# Patient Record
Sex: Female | Born: 1955
Health system: Southern US, Community
[De-identification: ages and names within clinical notes are randomized; demographics above are authoritative.]

## PROBLEM LIST (undated history)

## (undated) DIAGNOSIS — F419 Anxiety disorder, unspecified: Secondary | ICD-10-CM

## (undated) DIAGNOSIS — Z9889 Other specified postprocedural states: Secondary | ICD-10-CM

## (undated) DIAGNOSIS — R040 Epistaxis: Secondary | ICD-10-CM

## (undated) DIAGNOSIS — R0600 Dyspnea, unspecified: Secondary | ICD-10-CM

## (undated) DIAGNOSIS — I272 Pulmonary hypertension, unspecified: Secondary | ICD-10-CM

## (undated) DIAGNOSIS — I5032 Chronic diastolic (congestive) heart failure: Secondary | ICD-10-CM

## (undated) DIAGNOSIS — Q600 Renal agenesis, unilateral: Secondary | ICD-10-CM

## (undated) DIAGNOSIS — J439 Emphysema, unspecified: Secondary | ICD-10-CM

## (undated) DIAGNOSIS — G4730 Sleep apnea, unspecified: Secondary | ICD-10-CM

## (undated) DIAGNOSIS — K219 Gastro-esophageal reflux disease without esophagitis: Secondary | ICD-10-CM

## (undated) DIAGNOSIS — I480 Paroxysmal atrial fibrillation: Secondary | ICD-10-CM

## (undated) DIAGNOSIS — Z8669 Personal history of other diseases of the nervous system and sense organs: Secondary | ICD-10-CM

## (undated) DIAGNOSIS — F329 Major depressive disorder, single episode, unspecified: Secondary | ICD-10-CM

## (undated) DIAGNOSIS — Z8774 Personal history of (corrected) congenital malformations of heart and circulatory system: Secondary | ICD-10-CM

## (undated) DIAGNOSIS — J189 Pneumonia, unspecified organism: Secondary | ICD-10-CM

## (undated) DIAGNOSIS — J449 Chronic obstructive pulmonary disease, unspecified: Secondary | ICD-10-CM

## (undated) DIAGNOSIS — J4489 Other specified chronic obstructive pulmonary disease: Secondary | ICD-10-CM

## (undated) DIAGNOSIS — T753XXA Motion sickness, initial encounter: Secondary | ICD-10-CM

## (undated) DIAGNOSIS — E039 Hypothyroidism, unspecified: Secondary | ICD-10-CM

## (undated) DIAGNOSIS — I340 Nonrheumatic mitral (valve) insufficiency: Secondary | ICD-10-CM

## (undated) DIAGNOSIS — R112 Nausea with vomiting, unspecified: Secondary | ICD-10-CM

## (undated) DIAGNOSIS — F32A Depression, unspecified: Secondary | ICD-10-CM

## (undated) DIAGNOSIS — I10 Essential (primary) hypertension: Secondary | ICD-10-CM

## (undated) HISTORY — DX: Other specified chronic obstructive pulmonary disease: J44.89

## (undated) HISTORY — DX: Chronic diastolic (congestive) heart failure: I50.32

## (undated) HISTORY — DX: Essential (primary) hypertension: I10

## (undated) HISTORY — DX: Personal history of other diseases of the nervous system and sense organs: Z86.69

## (undated) HISTORY — DX: Other specified postprocedural states: Z98.890

## (undated) HISTORY — DX: Major depressive disorder, single episode, unspecified: F32.9

## (undated) HISTORY — PX: TONSILLECTOMY: SUR1361

## (undated) HISTORY — DX: Renal agenesis, unilateral: Q60.0

## (undated) HISTORY — DX: Depression, unspecified: F32.A

## (undated) HISTORY — PX: INNER EAR SURGERY: SHX679

## (undated) HISTORY — DX: Nonrheumatic mitral (valve) insufficiency: I34.0

## (undated) HISTORY — DX: Paroxysmal atrial fibrillation: I48.0

## (undated) HISTORY — PX: ABDOMINAL HYSTERECTOMY: SHX81

---

## 1994-03-31 HISTORY — PX: COMBINED HYSTERECTOMY ABDOMINAL W/ A&P REPAIR / OOPHORECTOMY: SUR292

## 2008-09-19 ENCOUNTER — Ambulatory Visit: Payer: Self-pay | Admitting: Internal Medicine

## 2008-09-27 ENCOUNTER — Ambulatory Visit: Payer: Self-pay | Admitting: Internal Medicine

## 2008-11-07 ENCOUNTER — Ambulatory Visit: Payer: Self-pay | Admitting: Internal Medicine

## 2009-03-31 HISTORY — PX: BREAST BIOPSY: SHX20

## 2009-05-01 ENCOUNTER — Other Ambulatory Visit: Payer: Self-pay | Admitting: Internal Medicine

## 2010-04-30 ENCOUNTER — Other Ambulatory Visit: Payer: Self-pay | Admitting: Internal Medicine

## 2010-12-10 ENCOUNTER — Ambulatory Visit: Payer: Self-pay | Admitting: Internal Medicine

## 2010-12-17 ENCOUNTER — Telehealth: Payer: Self-pay | Admitting: Internal Medicine

## 2010-12-17 NOTE — Telephone Encounter (Signed)
Her chart is not abstracted and I cannot recall her disability.  Please ask her to fax or write me a letter stating why she cannot sit for jury duty and I will be happy to review it.  Has she requested her records to be transferred yet?

## 2010-12-17 NOTE — Telephone Encounter (Signed)
Patient is asking for a letter to excuse her from [place] duty.

## 2010-12-17 NOTE — Telephone Encounter (Signed)
PT HAS BEEN CALLED FOR JURY DUTY 01/06/11.  SHE WOULD LIKE A LETTER STATING THAT SHE IS DISABLE AND CANNOT DO JURY DUTY

## 2010-12-19 ENCOUNTER — Encounter: Payer: Self-pay | Admitting: Internal Medicine

## 2010-12-19 NOTE — Telephone Encounter (Signed)
Patient notified. She says that she will bring a letter in and drop it off stating what her disability is. She will also sign medical release form for her records.

## 2011-01-20 ENCOUNTER — Telehealth: Payer: Self-pay | Admitting: Internal Medicine

## 2011-01-20 NOTE — Telephone Encounter (Signed)
Patient stated she currently takes the brand but would like to be out on the generic.  She takes 137 mcg.  Is this ok to call in?

## 2011-01-20 NOTE — Telephone Encounter (Signed)
Patient is needing samples of her synthroid 130 mg she can't afford to pay for her medication. She is waiting on her medicaid to start.

## 2011-01-20 NOTE — Telephone Encounter (Signed)
We do not have samples of Synthroid, to my knowledge.  Is she taking name brand or levothyroxine

## 2011-01-20 NOTE — Telephone Encounter (Signed)
Left message asking patient to return my call.

## 2011-01-20 NOTE — Telephone Encounter (Signed)
Yes can call in levothyroxne 137 mcg daily @30  with 4 refills

## 2011-01-21 NOTE — Telephone Encounter (Signed)
Left message for patient to return my call, need to know the pharmacy.

## 2011-01-24 MED ORDER — LEVOTHYROXINE SODIUM 137 MCG PO TABS: 137.0000 ug | ORAL_TABLET | Freq: Every day | ORAL | Status: DC

## 2011-01-24 NOTE — Telephone Encounter (Signed)
Patient called me back and Rx has been called in.

## 2011-01-24 NOTE — Telephone Encounter (Signed)
Addended by: Darletta Moll A on: 01/24/2011 09:11 AM   Modules accepted: Orders

## 2011-01-26 ENCOUNTER — Emergency Department: Payer: Self-pay | Admitting: Emergency Medicine

## 2011-02-03 ENCOUNTER — Ambulatory Visit (INDEPENDENT_AMBULATORY_CARE_PROVIDER_SITE_OTHER): Payer: Self-pay | Admitting: Internal Medicine

## 2011-02-03 ENCOUNTER — Encounter: Payer: Self-pay | Admitting: Internal Medicine

## 2011-02-03 VITALS — BP 142/80 | HR 83 | Temp 98.5°F | Resp 18 | Ht 62.5 in | Wt 262.2 lb

## 2011-02-03 DIAGNOSIS — Z13220 Encounter for screening for lipoid disorders: Secondary | ICD-10-CM

## 2011-02-03 DIAGNOSIS — E669 Obesity, unspecified: Secondary | ICD-10-CM

## 2011-02-03 DIAGNOSIS — E039 Hypothyroidism, unspecified: Secondary | ICD-10-CM

## 2011-02-03 DIAGNOSIS — Z79899 Other long term (current) drug therapy: Secondary | ICD-10-CM

## 2011-02-03 DIAGNOSIS — J209 Acute bronchitis, unspecified: Secondary | ICD-10-CM

## 2011-02-03 MED ORDER — BENZONATATE 200 MG PO CAPS: 200.0000 mg | ORAL_CAPSULE | Freq: Every evening | ORAL | Status: AC | PRN

## 2011-02-03 NOTE — Patient Instructions (Addendum)
You will need to call the old office at [phone] to get the financial information you need about last year's visits.  Please try the cough medicine Delsym available over the counter for your cough for during the day.  Use the prescription cough medicine for nighttime cough.,  It does not have codeine in it    Gargle with salt water for your scratchy   Avoid milk, ce cream and yogurt for a few more days becausde they can aggravate your cough

## 2011-02-03 NOTE — Progress Notes (Signed)
  Subjective:    Patient ID: Lauren Bartlett, female    DOB: 1955/07/14, 55 y.o.   MRN: [id number]  HPI   Lauren Bartlett is a 55 yo white female with history of hypercholesterolemia, depression, back pain and hypothyroidism  Who presents with symptoms of persistent bronchitis.  She was treated by ER  Last Sunday for cough productive of purulent sputum with a 5 day course of azithromycin.  She contineus to have cough and hoarseness  with scratchy throat and wheezing but notes that her cough is when she is supine.  She lives in an adult boarding house and has sick contacts but is apparently going to move to subsidized housing soon,  Review of Systems  Constitutional: Negative for fever, chills and unexpected weight change.  HENT: Negative for hearing loss, ear pain, nosebleeds, congestion, sore throat, facial swelling, rhinorrhea, sneezing, mouth sores, trouble swallowing, neck pain, neck stiffness, voice change, postnasal drip, sinus pressure, tinnitus and ear discharge.   Eyes: Negative for pain, discharge, redness and visual disturbance.  Respiratory: Positive for cough. Negative for chest tightness, shortness of breath, wheezing and stridor.   Cardiovascular: Negative for chest pain, palpitations and leg swelling.  Musculoskeletal: Positive for back pain. Negative for myalgias and arthralgias.  Skin: Negative for color change and rash.  Neurological: Negative for dizziness, weakness, light-headedness and headaches.  Hematological: Negative for adenopathy.       Objective:   Physical Exam  Constitutional: She is oriented to person, place, and time. She appears well-developed and well-nourished.  HENT:  Mouth/Throat: Oropharynx is clear and moist.  Eyes: EOM are normal. Pupils are equal, round, and reactive to light. No scleral icterus.  Neck: Normal range of motion. Neck supple. No JVD present. No thyromegaly present.  Cardiovascular: Normal rate, regular rhythm, normal heart sounds and intact distal  pulses.   Pulmonary/Chest: Effort normal and breath sounds normal. She has no wheezes.  Abdominal: Soft. Bowel sounds are normal. She exhibits no mass. There is no tenderness.  Musculoskeletal: Normal range of motion. She exhibits no edema.  Lymphadenopathy:    She has no cervical adenopathy.  Neurological: She is alert and oriented to person, place, and time.  Skin: Skin is warm and dry.  Psychiatric: Her behavior is normal. Judgment and thought content normal.       Mood and affect are chronically flat          Assessment & Plan:  Upper respiratory infection: she has no signs on exam of otitis or sinusitis and is not wheezing.  Continue  supportive care  Hypertension: her bp is improved from last visit but not at goal.  She has been ill and has taken decongestants recently.  No changes today.  Chronic back pain:  Per records just received form old practice: She was referred for MRI of lumbar spine in Feb 2011 but those records ar e  not available

## 2011-02-04 ENCOUNTER — Encounter: Payer: Self-pay | Admitting: Internal Medicine

## 2011-02-04 ENCOUNTER — Telehealth: Payer: Self-pay | Admitting: Internal Medicine

## 2011-02-04 DIAGNOSIS — I10 Essential (primary) hypertension: Secondary | ICD-10-CM | POA: Insufficient documentation

## 2011-02-04 DIAGNOSIS — Q600 Renal agenesis, unilateral: Secondary | ICD-10-CM | POA: Insufficient documentation

## 2011-02-04 DIAGNOSIS — F329 Major depressive disorder, single episode, unspecified: Secondary | ICD-10-CM | POA: Insufficient documentation

## 2011-02-04 DIAGNOSIS — F32A Depression, unspecified: Secondary | ICD-10-CM | POA: Insufficient documentation

## 2011-02-04 DIAGNOSIS — Z8669 Personal history of other diseases of the nervous system and sense organs: Secondary | ICD-10-CM | POA: Insufficient documentation

## 2011-02-04 NOTE — Telephone Encounter (Signed)
[phone] Pt needs a letter from you stating that she is disabled and can not work, so pt can get a low income,handicap apartment.  She needs this tomorrow and would like to pick this up when she comes in to get labs. Pt stated that she has a letter from social security that gives the reason why she is disabled she can bring this by tomorrow if you need to see it.

## 2011-02-04 NOTE — Telephone Encounter (Signed)
Left message for pt to call office

## 2011-02-04 NOTE — Telephone Encounter (Signed)
.   I do not sign disability papers for patients, unless they have already been awarded disability status by the [facility].  I do not hav e her old records so I do not know  if she is currently disabled and for what diagosis,  or is she requesting disability? Adn she has to give me at least a weeks notice for this kind of request

## 2011-02-04 NOTE — Telephone Encounter (Signed)
Pt will bring her disability paper from the state tomorrow 02/05/11

## 2011-02-05 ENCOUNTER — Other Ambulatory Visit: Payer: Self-pay

## 2011-02-06 ENCOUNTER — Other Ambulatory Visit: Payer: Self-pay | Admitting: Internal Medicine

## 2011-02-06 ENCOUNTER — Other Ambulatory Visit (INDEPENDENT_AMBULATORY_CARE_PROVIDER_SITE_OTHER): Payer: Self-pay | Admitting: *Deleted

## 2011-02-06 DIAGNOSIS — E039 Hypothyroidism, unspecified: Secondary | ICD-10-CM

## 2011-02-06 DIAGNOSIS — Z13220 Encounter for screening for lipoid disorders: Secondary | ICD-10-CM

## 2011-02-06 DIAGNOSIS — Z79899 Other long term (current) drug therapy: Secondary | ICD-10-CM

## 2011-02-06 LAB — COMPREHENSIVE METABOLIC PANEL
ALT: 18 U/L (ref 0–35)
Albumin: 4 g/dL (ref 3.5–5.2)
CO2: 31 mEq/L (ref 19–32)
Calcium: 8.2 mg/dL — ABNORMAL LOW (ref 8.4–10.5)
Chloride: 104 mEq/L (ref 96–112)
GFR: 79.1 mL/min (ref >60.00)
Glucose, Bld: 93 mg/dL (ref 70–99)
Potassium: 4 mEq/L (ref 3.5–5.1)
Sodium: 143 mEq/L (ref 135–145)
Total Bilirubin: 0.5 mg/dL (ref 0.3–1.2)
Total Protein: 7.7 g/dL (ref 6.0–8.3)

## 2011-02-06 LAB — LIPID PANEL: Total CHOL/HDL Ratio: 4

## 2011-02-06 LAB — TSH: TSH: 0.45 u[IU]/mL (ref 0.35–5.50)

## 2011-05-06 ENCOUNTER — Encounter: Payer: Self-pay | Admitting: Internal Medicine

## 2011-05-06 ENCOUNTER — Ambulatory Visit: Payer: Self-pay | Admitting: Internal Medicine

## 2011-05-09 ENCOUNTER — Other Ambulatory Visit: Payer: Self-pay | Admitting: Internal Medicine

## 2011-05-23 ENCOUNTER — Telehealth: Payer: Self-pay | Admitting: *Deleted

## 2011-05-23 ENCOUNTER — Emergency Department: Payer: Self-pay | Admitting: Emergency Medicine

## 2011-05-23 NOTE — Telephone Encounter (Signed)
Patient wanted you to know that she called EMS this morning because she is having flu like symptoms. She is having cold sweats, fever, achey, a lot congestion, some wheezing, she says that she has a terrible headache that is making her feel nauseated and feeling like she is going to throw up. They were going to take her to the hospital, but she refused to go because she wouldn't have a way to get home, so she is gong to drive to the ER. She wanted you to be aware

## 2011-06-09 ENCOUNTER — Other Ambulatory Visit: Payer: Self-pay | Admitting: Internal Medicine

## 2011-06-09 MED ORDER — LEVOTHYROXINE SODIUM 137 MCG PO TABS: 137.0000 ug | ORAL_TABLET | Freq: Every day | ORAL | Status: DC

## 2011-06-09 NOTE — Telephone Encounter (Signed)
Addended by: Darletta Moll A on: 06/09/2011 04:18 PM   Modules accepted: Orders

## 2011-06-13 ENCOUNTER — Ambulatory Visit: Payer: Self-pay | Admitting: Internal Medicine

## 2011-08-04 ENCOUNTER — Encounter: Payer: Self-pay | Admitting: Internal Medicine

## 2011-08-04 ENCOUNTER — Ambulatory Visit (INDEPENDENT_AMBULATORY_CARE_PROVIDER_SITE_OTHER): Payer: Medicare Other | Admitting: Internal Medicine

## 2011-08-04 VITALS — BP 142/76 | HR 74 | Temp 98.2°F | Resp 16 | Wt 243.8 lb

## 2011-08-04 DIAGNOSIS — E669 Obesity, unspecified: Secondary | ICD-10-CM | POA: Diagnosis not present

## 2011-08-04 DIAGNOSIS — I10 Essential (primary) hypertension: Secondary | ICD-10-CM

## 2011-08-04 DIAGNOSIS — Z79899 Other long term (current) drug therapy: Secondary | ICD-10-CM | POA: Diagnosis not present

## 2011-08-04 DIAGNOSIS — F32A Depression, unspecified: Secondary | ICD-10-CM

## 2011-08-04 DIAGNOSIS — E039 Hypothyroidism, unspecified: Secondary | ICD-10-CM

## 2011-08-04 DIAGNOSIS — F329 Major depressive disorder, single episode, unspecified: Secondary | ICD-10-CM

## 2011-08-04 DIAGNOSIS — Z1239 Encounter for other screening for malignant neoplasm of breast: Secondary | ICD-10-CM

## 2011-08-04 LAB — COMPLETE METABOLIC PANEL WITH GFR
ALT: 13 U/L (ref 0–35)
BUN: 9 mg/dL (ref 6–23)
CO2: 29 mEq/L (ref 19–32)
Calcium: 8.2 mg/dL — ABNORMAL LOW (ref 8.4–10.5)
Chloride: 102 mEq/L (ref 96–112)
Creat: 0.7 mg/dL (ref 0.50–1.10)
GFR, Est African American: >89 mL/min
GFR, Est Non African American: >89 mL/min
Glucose, Bld: 75 mg/dL (ref 70–99)
Total Bilirubin: 0.5 mg/dL (ref 0.3–1.2)

## 2011-08-04 LAB — TSH: TSH: 0.51 u[IU]/mL (ref 0.35–5.50)

## 2011-08-04 MED ORDER — LOSARTAN POTASSIUM 100 MG PO TABS: 100.0000 mg | ORAL_TABLET | Freq: Every day | ORAL | Status: DC

## 2011-08-04 NOTE — Assessment & Plan Note (Signed)
With a 20 lb wt loss in 5 months.  Encouragement given.  Diet reviewed.  Return in 6 months

## 2011-08-04 NOTE — Patient Instructions (Addendum)
You may improve your diet by eating fewer servings of starches  By avoiding bananas.  Strawberries  Blueberries,  Cherries are all better choices bc they are lower in sugar.   Consider the Low Glycemic Index Diet and 6 smaller meals daily to continue your weight loss :   7 AM Low carbohydrate Protein  Shakes (EAS Carb Control  Or Atkins ,  Available everywhere,   In  cases at [employer]Js )  2.5 carbs  (Add or substitute a toasted sandwhich thin w/ peanut butter)  Or try 2 hard boiled egg ,    10 AM: Protein bar by Atkins (snack size,  Chocolate lover's variety at  [employer]J's)    Lunch: sandwich on pita bread or flatbread (Joseph's makes a pita bread and a flat bread , available at [employer] and [employer]; Toufayah makes a low carb flatbread available at [employer] and [employer]) Mission makes a "car balance " tortilla tha tis full of fiber and low in carbohydrates  3 PM:  Mid day :  Another protein bar,  Or a  cheese stick, 1/4 cup of almonds, walnuts, pistachios, pecans, peanuts,  Macadamia nuts  6 PM  Dinner:  "mean and green:"  Meat/chicken/fish, salad, and green veggie : use ranch, vinagrette,  Blue cheese, etc  9 PM snack : Breyer's low carb fudgiscle or  ice cream bar (Carb Smart) Weight Watcher's ice cream bar , or another protein shake

## 2011-08-04 NOTE — Progress Notes (Signed)
Patient ID: Lauren Bartlett, female   DOB: 13-Jun-1955, 56 y.o.   MRN: [id number] Patient Active Problem List  Diagnoses  . Depression  . Hypertension  . Congenital absence of one kidney  . History of sciatica  . Obesity (BMI 30-39.9)    Subjective:  CC:   Chief Complaint  Patient presents with  . Follow-up    HPI: Has lost 20 lbs by walking 2 miles  4 days per week about and restricting her diet. She has noticed an  Improvement in dsypnea since she started walking 6 months ago.  The exercise program started when she moved in to her new residence 6 months ago. Her dietary changes incldue avoidance of junk food including cakes and desserts and discontinuing sodas.     Lauren Bartlett a 56 y.o. female who presents   Past Medical History  Diagnosis Date  . Congenital absence of one kidney   . History of sciatica   . [facility]     [facility]  . Hypertension   . hypothyrodism     Past Surgical History  Procedure Date  . Combined hysterectomy abdominal w/ a&p repair / oophorectomy 1996    benign tumor  . Breast biopsy 2011    [facility]< benign  . Inner ear surgery     bilateral         The following portions of the patient's history were reviewed and updated as appropriate: Allergies, current medications, and problem list.    Review of Systems:   12 Pt  review of systems was negative except those addressed in the HPI,     History   Social History  . Marital Status: Single    Spouse Name: N/A    Number of Children: N/A  . Years of Education: N/A   Occupational History  . cna     part time   Social History Main Topics  . Smoking status: Never Smoker   . Smokeless tobacco: Never Used  . Alcohol Use: No  . Drug Use: No  . Sexually Active: Not on file   Other Topics Concern  . Not on file   Social History Narrative   Lives in a group home for adults    Objective:  BP 142/76  Pulse 74  Temp(Src) 98.2 F (36.8 C) (Oral)  Resp 16  Wt 243 lb  12 oz (110.564 kg)  SpO2 93%  General appearance: alert, cooperative and appears stated age Ears: normal TM's and external ear canals both ears Throat: lips, mucosa, and tongue normal; teeth and gums normal Neck: no adenopathy, no carotid bruit, supple, symmetrical, trachea midline and thyroid not enlarged, symmetric, no tenderness/mass/nodules Back: symmetric, no curvature. ROM normal. No CVA tenderness. Lungs: clear to auscultation bilaterally Heart: regular rate and rhythm, S1, S2 normal, no murmur, click, rub or gallop Abdomen: soft, non-tender; bowel sounds normal; no masses,  no organomegaly Pulses: 2+ and symmetric Skin: Skin color, texture, turgor normal. No rashes or lesions Lymph nodes: Cervical, supraclavicular, and axillary nodes normal.  Assessment and Plan:  Depression Stable symptoms on current medication.   Obesity (BMI 30-39.9) With a 20 lb wt loss in 5 months.  Encouragement given.  Diet reviewed.  Return in 6 months     Updated Medication List Outpatient Encounter Prescriptions as of 08/04/2011  Medication Sig Dispense Refill  . hydrochlorothiazide (HYDRODIURIL) 25 MG tablet TAKE 1 TABLET BY MOUTH DAILY  90 tablet  0  . levothyroxine (SYNTHROID, LEVOTHROID) 137 MCG tablet  Take 1 tablet (137 mcg total) by mouth daily.  30 tablet  3  . DISCONTD: enalapril (VASOTEC) 20 MG tablet TAKE ONE TABLET BY MOUTH DAILY  90 tablet  0  . losartan (COZAAR) 100 MG tablet Take 1 tablet (100 mg total) by mouth daily.  90 tablet  3  . DISCONTD: Vitamin D, Ergocalciferol, (DRISDOL) 50000 UNITS CAPS TAKE ONE CAPSULE BY MOUTH EVERY WEEK  12 capsule  0     Orders Placed This Encounter  Procedures  . MM Digital Screening  . COMPLETE METABOLIC PANEL WITH GFR  . TSH    No Follow-up on file.

## 2011-08-04 NOTE — Assessment & Plan Note (Signed)
Stable symptoms on current medication.

## 2011-08-06 ENCOUNTER — Other Ambulatory Visit: Payer: Self-pay | Admitting: Internal Medicine

## 2011-08-12 DIAGNOSIS — Z1231 Encounter for screening mammogram for malignant neoplasm of breast: Secondary | ICD-10-CM | POA: Diagnosis not present

## 2011-09-09 ENCOUNTER — Encounter: Payer: Self-pay | Admitting: Internal Medicine

## 2011-10-01 ENCOUNTER — Other Ambulatory Visit: Payer: Self-pay | Admitting: Internal Medicine

## 2011-10-22 ENCOUNTER — Telehealth: Payer: Self-pay | Admitting: Internal Medicine

## 2011-10-22 NOTE — Telephone Encounter (Signed)
Patient would like to have home health come out to her apartment.  She states she is on disability and needs help around her house.  She wanted to know if you could refer her to an agency.

## 2011-10-22 NOTE — Telephone Encounter (Signed)
She will have to make an appt  With me to  Qualify for home health.  I can't just order it

## 2011-10-23 NOTE — Telephone Encounter (Signed)
Left detailed message notifying patient to call the office back to schedule an appt.

## 2011-11-07 ENCOUNTER — Ambulatory Visit: Payer: Medicare Other | Admitting: Internal Medicine

## 2011-12-18 ENCOUNTER — Telehealth: Payer: Self-pay | Admitting: Internal Medicine

## 2011-12-18 NOTE — Telephone Encounter (Signed)
Patient needs a letter stating that she is on disability and faxed to [phone] [facility] where she leaves.

## 2011-12-18 NOTE — Telephone Encounter (Signed)
Letter on printer

## 2011-12-19 NOTE — Telephone Encounter (Signed)
Letter faxed to the number provided.

## 2011-12-23 ENCOUNTER — Ambulatory Visit: Payer: Medicare Other | Admitting: Internal Medicine

## 2011-12-26 ENCOUNTER — Other Ambulatory Visit: Payer: Self-pay | Admitting: Internal Medicine

## 2012-01-09 ENCOUNTER — Ambulatory Visit (INDEPENDENT_AMBULATORY_CARE_PROVIDER_SITE_OTHER): Payer: Medicare Other | Admitting: Internal Medicine

## 2012-01-09 ENCOUNTER — Encounter: Payer: Self-pay | Admitting: Internal Medicine

## 2012-01-09 VITALS — BP 136/80 | HR 71 | Temp 98.4°F | Ht 62.5 in | Wt 239.5 lb

## 2012-01-09 DIAGNOSIS — R011 Cardiac murmur, unspecified: Secondary | ICD-10-CM

## 2012-01-09 DIAGNOSIS — E669 Obesity, unspecified: Secondary | ICD-10-CM | POA: Diagnosis not present

## 2012-01-09 DIAGNOSIS — Z8669 Personal history of other diseases of the nervous system and sense organs: Secondary | ICD-10-CM

## 2012-01-09 NOTE — Patient Instructions (Addendum)
I am refer you to [facility] cardiology for an evaluation of your heart murmur,  They will do an ultrasound of your heart.    I am also referring you for physical therapy for the back pain .   You can take 2 advil and 1 tylenol up to twice daily for  the back pain

## 2012-01-09 NOTE — Progress Notes (Signed)
Patient ID: Lauren Bartlett, female   DOB: Sep 24, 1955, 56 y.o.   MRN: [id number]  Patient Active Problem List  Diagnosis  . Depression  . Hypertension  . Congenital absence of one kidney  . History of sciatica  . Obesity (BMI 30-39.9)  . Systolic murmur    Subjective:  CC:   Chief Complaint  Patient presents with  . Follow-up    HPI:   Lauren Bartlett a 56 y.o. female who presents for follow up on obesity, hypertension and back pain.  She achieved a 32 lb wt loss achieved with dietary modification and daily walking of 20  Minutes  Including hills .  She uses a walker to walk to avoid falls.  She has not fallen but feels unsteady using only the cane. 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  She has particular difficulty bending down and cleaning to clean her bathroom, and mop and sweep.  She has a history of sciatica which is brought on by these activities, with pain radiating into right buttock. She is able to prepare meals but gets very tired and has to rest in between chores.    2) fatigue.  She reports  3 pillow orthopnea which is chronic.  complains of fatigue all the time.  Left leg edema brought on by walking,  No  Claudication symptoms.      Past Medical History  Diagnosis Date  . Congenital absence of one kidney   . History of sciatica   . [facility]     [facility]  . Hypertension   . hypothyrodism     Past Surgical History  Procedure Date  . Combined hysterectomy abdominal w/ a&p repair / oophorectomy 1996    benign tumor  . Breast biopsy 2011    [facility]< benign  . Inner ear surgery     bilateral    The following portions of the patient's history were reviewed and updated as appropriate: Allergies, current medications, and problem list.   Review of Systems:   12 Pt  review of systems was negative except those addressed in the HPI   History   Social History  . Marital Status: Single    Spouse  Name: N/A    Number of Children: N/A  . Years of Education: N/A   Occupational History  . cna     part time   Social History Main Topics  . Smoking status: Never Smoker   . Smokeless tobacco: Never Used  . Alcohol Use: No  . Drug Use: No  . Sexually Active: Not on file   Other Topics Concern  . Not on file   Social History Narrative   Lives in a group home for adults    Objective:  BP 136/80  Pulse 71  Temp 98.4 F (36.9 C) (Oral)  Ht 5' 2.5" (1.588 m)  Wt 239 lb 8 oz (108.636 kg)  BMI 43.11 kg/m2  SpO2 96%  General appearance: alert, cooperative and appears stated age Ears: normal TM's and external ear canals both ears Throat: lips, mucosa, and tongue normal; teeth and gums normal Neck: no adenopathy, no carotid bruit, supple, symmetrical, trachea midline and thyroid not enlarged, symmetric, no tenderness/mass/nodules Back: symmetric, no curvature. ROM normal. No CVA tenderness. Lungs: clear to auscultation bilaterally Heart: regular rate and rhythm, crescendo systolic murmur, no click, rub or gallop Abdomen: soft, non-tender; bowel sounds normal; no masses,  no organomegaly Pulses: 2+ and symmetric  Skin: Skin color, texture, turgor normal. No rashes or lesions Lymph nodes: Cervical, supraclavicular, and axillary nodes normal. Bacl: decreased ROM, positive straight leg lift on right  Assessment and Plan:  History of sciatica Suggested by prior x rays done at [facility].  She has not had an MRI due to panic attacks brought on close spaces. Her pain is brought on by walking or doing household chores and radiates to both thighs but does not progress to below the knee.  She uses a cane in the house but a walker outside of the home.  She would benefit from a home health aide.   Obesity (BMI 30-39.9) Improved, with weight loss of  32 lbs intentionally with diet and exercise.    Systolic murmur New onset , with orthopnea and fatigue .  Referral to cardiology for ECHO ad  evaluation .    Updated Medication List Outpatient Encounter Prescriptions as of 01/09/2012  Medication Sig Dispense Refill  . hydrochlorothiazide (HYDRODIURIL) 25 MG tablet TAKE 1 TABLET BY MOUTH DAILY  90 tablet  1  . levothyroxine (SYNTHROID, LEVOTHROID) 137 MCG tablet TAKE 1 TABLET BY MOUTH EVERY DAY  30 tablet  2  . losartan (COZAAR) 100 MG tablet Take 1 tablet (100 mg total) by mouth daily.  90 tablet  3     Orders Placed This Encounter  Procedures  . Ambulatory referral to Physical Therapy  . Ambulatory referral to Cardiology    No Follow-up on file.

## 2012-01-09 NOTE — Assessment & Plan Note (Addendum)
Suggested by prior x rays done at [facility].  She has not had an MRI due to panic attacks brought on close spaces. Her pain is brought on by walking or doing household chores and radiates to both thighs but does not progress to below the knee.  She uses a cane in the house but a walker outside of the home.  She would benefit from a home health aide.

## 2012-01-09 NOTE — Assessment & Plan Note (Addendum)
Improved, with weight loss of  32 lbs intentionally with diet and exercise.

## 2012-01-11 ENCOUNTER — Encounter: Payer: Self-pay | Admitting: Internal Medicine

## 2012-01-11 DIAGNOSIS — I340 Nonrheumatic mitral (valve) insufficiency: Secondary | ICD-10-CM | POA: Insufficient documentation

## 2012-01-11 NOTE — Assessment & Plan Note (Signed)
New onset , with orthopnea and fatigue .  Referral to cardiology for ECHO ad evaluation .

## 2012-01-21 ENCOUNTER — Encounter: Payer: Self-pay | Admitting: Internal Medicine

## 2012-01-21 DIAGNOSIS — M545 Low back pain: Secondary | ICD-10-CM | POA: Diagnosis not present

## 2012-01-21 DIAGNOSIS — R262 Difficulty in walking, not elsewhere classified: Secondary | ICD-10-CM | POA: Diagnosis not present

## 2012-01-21 DIAGNOSIS — M25559 Pain in unspecified hip: Secondary | ICD-10-CM | POA: Diagnosis not present

## 2012-01-21 DIAGNOSIS — IMO0001 Reserved for inherently not codable concepts without codable children: Secondary | ICD-10-CM | POA: Diagnosis not present

## 2012-01-26 DIAGNOSIS — IMO0001 Reserved for inherently not codable concepts without codable children: Secondary | ICD-10-CM | POA: Diagnosis not present

## 2012-01-26 DIAGNOSIS — M545 Low back pain: Secondary | ICD-10-CM | POA: Diagnosis not present

## 2012-01-26 DIAGNOSIS — R262 Difficulty in walking, not elsewhere classified: Secondary | ICD-10-CM | POA: Diagnosis not present

## 2012-01-26 DIAGNOSIS — M25559 Pain in unspecified hip: Secondary | ICD-10-CM | POA: Diagnosis not present

## 2012-01-27 ENCOUNTER — Ambulatory Visit: Payer: Medicare Other | Admitting: Cardiovascular Disease

## 2012-01-28 DIAGNOSIS — M545 Low back pain: Secondary | ICD-10-CM | POA: Diagnosis not present

## 2012-01-28 DIAGNOSIS — IMO0001 Reserved for inherently not codable concepts without codable children: Secondary | ICD-10-CM | POA: Diagnosis not present

## 2012-01-28 DIAGNOSIS — R262 Difficulty in walking, not elsewhere classified: Secondary | ICD-10-CM | POA: Diagnosis not present

## 2012-01-28 DIAGNOSIS — M25559 Pain in unspecified hip: Secondary | ICD-10-CM | POA: Diagnosis not present

## 2012-01-29 ENCOUNTER — Other Ambulatory Visit: Payer: Self-pay | Admitting: Internal Medicine

## 2012-01-30 ENCOUNTER — Encounter: Payer: Self-pay | Admitting: Internal Medicine

## 2012-01-30 DIAGNOSIS — IMO0001 Reserved for inherently not codable concepts without codable children: Secondary | ICD-10-CM | POA: Diagnosis not present

## 2012-01-30 DIAGNOSIS — M545 Low back pain, unspecified: Secondary | ICD-10-CM | POA: Diagnosis not present

## 2012-01-30 DIAGNOSIS — R262 Difficulty in walking, not elsewhere classified: Secondary | ICD-10-CM | POA: Diagnosis not present

## 2012-01-30 NOTE — Telephone Encounter (Signed)
Refill request for Hydrochlorothiazide 25 mg sent electronic to [facility]

## 2012-02-04 DIAGNOSIS — R609 Edema, unspecified: Secondary | ICD-10-CM | POA: Diagnosis not present

## 2012-02-04 DIAGNOSIS — R011 Cardiac murmur, unspecified: Secondary | ICD-10-CM | POA: Diagnosis not present

## 2012-02-04 DIAGNOSIS — R0609 Other forms of dyspnea: Secondary | ICD-10-CM | POA: Diagnosis not present

## 2012-02-06 ENCOUNTER — Other Ambulatory Visit (INDEPENDENT_AMBULATORY_CARE_PROVIDER_SITE_OTHER): Payer: Medicare Other

## 2012-02-06 ENCOUNTER — Telehealth: Payer: Self-pay | Admitting: Internal Medicine

## 2012-02-06 DIAGNOSIS — D3190 Benign neoplasm of unspecified part of unspecified eye: Secondary | ICD-10-CM | POA: Diagnosis not present

## 2012-02-06 DIAGNOSIS — E785 Hyperlipidemia, unspecified: Secondary | ICD-10-CM

## 2012-02-06 DIAGNOSIS — Z79899 Other long term (current) drug therapy: Secondary | ICD-10-CM | POA: Diagnosis not present

## 2012-02-06 DIAGNOSIS — Z13220 Encounter for screening for lipoid disorders: Secondary | ICD-10-CM | POA: Diagnosis not present

## 2012-02-06 DIAGNOSIS — M2141 Flat foot [pes planus] (acquired), right foot: Secondary | ICD-10-CM

## 2012-02-06 LAB — LIPID PANEL
LDL Cholesterol: 112 mg/dL — ABNORMAL HIGH (ref 0–99)
VLDL: 27 mg/dL (ref 0.0–40.0)

## 2012-02-06 LAB — COMPREHENSIVE METABOLIC PANEL
AST: 18 U/L (ref 0–37)
Albumin: 3.9 g/dL (ref 3.5–5.2)
Alkaline Phosphatase: 62 U/L (ref 39–117)
Potassium: 4.2 mEq/L (ref 3.5–5.1)
Sodium: 140 mEq/L (ref 135–145)
Total Protein: 7.1 g/dL (ref 6.0–8.3)

## 2012-02-06 NOTE — Telephone Encounter (Signed)
To order podiatry referral I need to have a diagnosis.   Can she tell us what is going on with her feet or ankle and which one?

## 2012-02-06 NOTE — Telephone Encounter (Signed)
Pt came in and stated her physcial therapist recommended that Lauren Bartlett go to a podiatrist.  Can she get a referral  Pt would like to go to [facility]

## 2012-02-08 MED ORDER — LEVOTHYROXINE SODIUM 125 MCG PO TABS: 125.0000 ug | ORAL_TABLET | Freq: Every day | ORAL | Status: DC

## 2012-02-08 NOTE — Addendum Note (Signed)
Addended by: Sherlene Shams on: 02/08/2012 09:22 PM   Modules accepted: Orders

## 2012-02-10 NOTE — Telephone Encounter (Signed)
Left message on patient vm asking her to call me back about referral to podiatrist.

## 2012-02-11 NOTE — Telephone Encounter (Signed)
Patient has flat feet needing an arch put in her shoe.

## 2012-02-13 NOTE — Telephone Encounter (Signed)
Thank you.  Podiatry referral made for flat feet.

## 2012-02-16 DIAGNOSIS — R269 Unspecified abnormalities of gait and mobility: Secondary | ICD-10-CM | POA: Diagnosis not present

## 2012-02-16 DIAGNOSIS — M19079 Primary osteoarthritis, unspecified ankle and foot: Secondary | ICD-10-CM | POA: Diagnosis not present

## 2012-02-29 ENCOUNTER — Encounter: Payer: Self-pay | Admitting: Internal Medicine

## 2012-02-29 DIAGNOSIS — IMO0001 Reserved for inherently not codable concepts without codable children: Secondary | ICD-10-CM | POA: Diagnosis not present

## 2012-02-29 DIAGNOSIS — M25559 Pain in unspecified hip: Secondary | ICD-10-CM | POA: Diagnosis not present

## 2012-02-29 DIAGNOSIS — R262 Difficulty in walking, not elsewhere classified: Secondary | ICD-10-CM | POA: Diagnosis not present

## 2012-02-29 DIAGNOSIS — M545 Low back pain, unspecified: Secondary | ICD-10-CM | POA: Diagnosis not present

## 2012-05-03 ENCOUNTER — Telehealth: Payer: Self-pay | Admitting: Internal Medicine

## 2012-05-03 DIAGNOSIS — E039 Hypothyroidism, unspecified: Secondary | ICD-10-CM

## 2012-05-03 NOTE — Telephone Encounter (Signed)
Pt called checking to see if she needs to come in to have her thyoid labs.  She is almost out of her med

## 2012-05-03 NOTE — Telephone Encounter (Signed)
Pt.notified

## 2012-05-03 NOTE — Telephone Encounter (Signed)
does not need to be fasting for thyroid test .  Come in any time

## 2012-05-03 NOTE — Telephone Encounter (Signed)
Yes, we changed her dose (lowered it ) in November,  She can come in any time.

## 2012-05-04 ENCOUNTER — Other Ambulatory Visit (INDEPENDENT_AMBULATORY_CARE_PROVIDER_SITE_OTHER): Payer: Medicare Other

## 2012-05-04 DIAGNOSIS — E039 Hypothyroidism, unspecified: Secondary | ICD-10-CM

## 2012-05-04 LAB — TSH: TSH: 0.25 u[IU]/mL — ABNORMAL LOW (ref 0.35–5.50)

## 2012-05-04 MED ORDER — LEVOTHYROXINE SODIUM 112 MCG PO TABS: 112.0000 ug | ORAL_TABLET | Freq: Every day | ORAL | Status: DC

## 2012-05-04 NOTE — Addendum Note (Signed)
Addended by: Sherlene Shams on: 05/04/2012 05:45 PM   Modules accepted: Orders

## 2012-05-05 ENCOUNTER — Telehealth: Payer: Self-pay | Admitting: Internal Medicine

## 2012-05-05 ENCOUNTER — Other Ambulatory Visit: Payer: Self-pay | Admitting: Internal Medicine

## 2012-05-05 NOTE — Telephone Encounter (Signed)
Pt.notified

## 2012-05-05 NOTE — Progress Notes (Signed)
LMTCB

## 2012-05-05 NOTE — Telephone Encounter (Signed)
Med filled.  

## 2012-05-05 NOTE — Telephone Encounter (Signed)
Patient wanting labs results.

## 2012-07-06 DIAGNOSIS — H43819 Vitreous degeneration, unspecified eye: Secondary | ICD-10-CM | POA: Diagnosis not present

## 2012-07-06 DIAGNOSIS — H52229 Regular astigmatism, unspecified eye: Secondary | ICD-10-CM | POA: Diagnosis not present

## 2012-07-06 DIAGNOSIS — H524 Presbyopia: Secondary | ICD-10-CM | POA: Diagnosis not present

## 2012-07-28 ENCOUNTER — Other Ambulatory Visit: Payer: Self-pay | Admitting: Internal Medicine

## 2012-07-28 DIAGNOSIS — R0989 Other specified symptoms and signs involving the circulatory and respiratory systems: Secondary | ICD-10-CM | POA: Diagnosis not present

## 2012-07-28 DIAGNOSIS — R011 Cardiac murmur, unspecified: Secondary | ICD-10-CM | POA: Diagnosis not present

## 2012-07-28 DIAGNOSIS — R609 Edema, unspecified: Secondary | ICD-10-CM | POA: Diagnosis not present

## 2012-07-28 DIAGNOSIS — I10 Essential (primary) hypertension: Secondary | ICD-10-CM | POA: Diagnosis not present

## 2012-07-28 DIAGNOSIS — R0609 Other forms of dyspnea: Secondary | ICD-10-CM | POA: Diagnosis not present

## 2012-07-28 NOTE — Telephone Encounter (Signed)
Called patient notified by voicemail per DPR she needs appointment for further refills.

## 2012-08-26 ENCOUNTER — Other Ambulatory Visit: Payer: Self-pay | Admitting: Internal Medicine

## 2012-08-26 ENCOUNTER — Ambulatory Visit: Payer: Medicare Other | Admitting: Internal Medicine

## 2012-08-26 NOTE — Telephone Encounter (Signed)
Patient needs a office visit before we can refill her medications. Her number is no longer in use to get in contact with her. And she has not been seen since 12/2011

## 2012-08-26 NOTE — Telephone Encounter (Signed)
Was able to reach patient voicemail left message to call office, please advise as to refill on medications

## 2012-08-27 MED ORDER — LOSARTAN POTASSIUM 100 MG PO TABS: ORAL_TABLET | ORAL | Status: DC

## 2012-08-27 MED ORDER — LEVOTHYROXINE SODIUM 112 MCG PO TABS: ORAL_TABLET | ORAL | Status: DC

## 2012-08-27 MED ORDER — HYDROCHLOROTHIAZIDE 25 MG PO TABS: ORAL_TABLET | ORAL | Status: DC

## 2012-08-27 NOTE — Telephone Encounter (Signed)
Ok to refill,  Refills sent  

## 2012-08-27 NOTE — Telephone Encounter (Signed)
Pt returned you call.  Pt has appointment 09/07/12.   [phone] pt phone Pt will run out of her meds wed 6/4

## 2012-08-27 NOTE — Telephone Encounter (Signed)
Patient has an appointment scheduled may I refill.

## 2012-08-27 NOTE — Addendum Note (Signed)
Addended by: Sherlene Shams on: 08/27/2012 05:38 PM   Modules accepted: Orders

## 2012-09-02 DIAGNOSIS — Z1231 Encounter for screening mammogram for malignant neoplasm of breast: Secondary | ICD-10-CM | POA: Diagnosis not present

## 2012-09-07 ENCOUNTER — Ambulatory Visit (INDEPENDENT_AMBULATORY_CARE_PROVIDER_SITE_OTHER): Payer: Medicare Other | Admitting: Internal Medicine

## 2012-09-07 ENCOUNTER — Encounter: Payer: Self-pay | Admitting: Internal Medicine

## 2012-09-07 VITALS — BP 148/86 | HR 76 | Temp 98.3°F | Resp 16 | Wt 236.5 lb

## 2012-09-07 DIAGNOSIS — R011 Cardiac murmur, unspecified: Secondary | ICD-10-CM

## 2012-09-07 DIAGNOSIS — I10 Essential (primary) hypertension: Secondary | ICD-10-CM | POA: Diagnosis not present

## 2012-09-07 DIAGNOSIS — Z1211 Encounter for screening for malignant neoplasm of colon: Secondary | ICD-10-CM | POA: Diagnosis not present

## 2012-09-07 DIAGNOSIS — E039 Hypothyroidism, unspecified: Secondary | ICD-10-CM

## 2012-09-07 LAB — BASIC METABOLIC PANEL
BUN: 13 mg/dL (ref 6–23)
CO2: 23 mEq/L (ref 19–32)
Chloride: 105 mEq/L (ref 96–112)
Creatinine, Ser: 0.8 mg/dL (ref 0.4–1.2)
Potassium: 3.8 mEq/L (ref 3.5–5.1)

## 2012-09-07 NOTE — Assessment & Plan Note (Signed)
Saw [facility] for evaluation of murmur  On 6 month follow up

## 2012-09-07 NOTE — Progress Notes (Signed)
Patient ID: Lauren Bartlett, female   DOB: [birth year], 57 y.o.   MRN: [id number]    Patient ID: Lauren Bartlett, female   DOB: [birth year], 57 y.o.   MRN: [id number]  Patient Active Problem List  Diagnosis  . Depression  . Hypertension  . Congenital absence of one kidney  . History of sciatica  . Obesity (BMI 30-39.9)  . Systolic murmur    Subjective:  CC:   Chief Complaint  Patient presents with  . Follow-up    HPI:   Lauren Bartlett a 57 y.o. female who presents for follow up on lumbar back pain , hypertension and obesity.  She has lost 35 lbs thus far dietary modification and daily walking of 20  Minutes  Including hills .  She uses a walker to walk to avoid falls.  She has had no recent falls.  Shehas a home health aid to help with housecleaning as she has particular difficulty bending down and cleaning to clean her bathroom, and mop and sweep.  She has a history of sciatica which is brought on by these activities, with pain radiating into right buttock. She is able to prepare meals but gets very tired and has to rest in between chores.      Past Medical History  Diagnosis Date  . Congenital absence of one kidney   . History of sciatica   . Depression     [facility]  . Hypertension   . hypothyrodism     Past Surgical History  Procedure Date  . Combined hysterectomy abdominal w/ a&p repair / oophorectomy 1996    benign tumor  . Breast biopsy 2011    [facility]< benign  . Inner ear surgery     bilateral    The following portions of the patient's history were reviewed and updated as appropriate: Allergies, current medications, and problem list.   Review of Systems:   Patient denies headache, fevers, malaise, unintentional weight loss, skin rash, eye pain, sinus congestion and sinus pain, sore throat, dysphagia,  hemoptysis , cough, dyspnea, wheezing, chest pain, palpitations, orthopnea, edema, abdominal pain, nausea, melena, diarrhea, constipation, flank pain, dysuria,  hematuria, urinary  Frequency, nocturia, numbness, tingling, seizures,  Focal weakness, Loss of consciousness,  Tremor, insomnia, depression, anxiety, and suicidal ideation.     History   Social History  . Marital Status: Single    Spouse Name: N/A    Number of Children: N/A  . Years of Education: N/A   Occupational History  . cna     part time   Social History Main Topics  . Smoking status: Never Smoker   . Smokeless tobacco: Never Used  . Alcohol Use: No  . Drug Use: No  . Sexually Active: Not on file   Other Topics Concern  . Not on file   Social History Narrative   Lives in a group home for adults    Objective:  BP 136/80  Pulse 71  Temp 98.4 F (36.9 C) (Oral)  Ht 5' 2.5" (1.588 m)  Wt 239 lb 8 oz (108.636 kg)  BMI 43.11 kg/m2  SpO2 96%  General appearance: alert, cooperative and appears stated age Ears: normal TM's and external ear canals both ears Throat: lips, mucosa, and tongue normal; teeth and gums normal Neck: no adenopathy, no carotid bruit, supple, symmetrical, trachea midline and thyroid not enlarged, symmetric, no tenderness/mass/nodules Back: symmetric, no curvature. ROM normal. No CVA tenderness. Lungs: clear to auscultation bilaterally Heart: regular rate and rhythm,  crescendo systolic murmur, no click, rub or gallop Abdomen: soft, non-tender; bowel sounds normal; no masses,  no organomegaly Pulses: 2+ and symmetric Skin: Skin color, texture, turgor normal. No rashes or lesions Lymph nodes: Cervical, supraclavicular, and axillary nodes normal. Bacl: decreased ROM, positive straight leg lift on right  Assessment and Plan:  History of sciatica Suggested by prior x rays done at [facility].  She has not had an MRI due to panic attacks brought on close spaces. Her pain is improved with continued physical activity and avoidance of repetitive activities .  She uses a cane in the house but a walker outside of the home.    Obesity (BMI 30-39.9) Improved,  with weight loss of  32 lbs intentionally with diet and exercise.    Systolic murmur New onset , with orthopnea and fatigue .  She has had cardiology evaluation by [facility] with ECHO .  Results not available.    Updated Medication List Outpatient Encounter Prescriptions as of 01/09/2012  Medication Sig Dispense Refill  . hydrochlorothiazide (HYDRODIURIL) 25 MG tablet TAKE 1 TABLET BY MOUTH DAILY  90 tablet  1  . levothyroxine (SYNTHROID, LEVOTHROID) 137 MCG tablet TAKE 1 TABLET BY MOUTH EVERY DAY  30 tablet  2  . losartan (COZAAR) 100 MG tablet Take 1 tablet (100 mg total) by mouth daily.  90 tablet  3

## 2012-09-07 NOTE — Patient Instructions (Addendum)
For your sinus drainage,  Try Using Simply saline to flush sinuses twice daily and try allegra OTV for allergies   You need to lose 10%  Of your current body weight over the next 6 months   This is  my version of a  "Low GI"  Diet:  It is not ultra low carb, but will still lower your blood sugars and allow you to lose 5 to 10 lbs per month if you follow it carefully. All of the foods can be found at grocery stores and in bulk at [employer].  The Atkins protein bars and shakes are available in more varieties at [employer], [employer] and [employer].     7 AM Breakfast:  Low carbohydrate Protein  Shakes (I recommend the EAS AdvantEdge "Carb Control" shakes  Or the low carb shakes by Atkins.   Both are available everywhere:  In  cases at [employer]Js  Or in 4 packs at grocery stores and pharmacies  2.5 carbs  (Alternative is  a toasted Arnold's Sandwhich Thin w/ peanut butter, a "Bagel Thin" with cream cheese and salmon) or  a scrambled egg burrito made with a low carb tortilla .  Avoid cereal and bananas, oatmeal too unless you are cooking the old fashioned kind that takes 30-40 minutes to prepare.  the rest is overly processed, has minimal fiber, and is loaded with carbohydrates!   10 AM: Protein bar by Atkins (the snack size, under 200 cal).  There are many varieties , available widely again or in bulk in limited varieties at BJs)  Other so called "protein bars" tend to be loaded with carbohydrates.  Remember, in food advertising, the word "energy" is synonymous for " carbohydrate."  Lunch: sandwich of [place], (or any lunchmeat, grilled meat or canned tuna), fresh avocado, mayonnaise  and cheese on a lower carbohydrate pita bread, flatbread, or tortilla . Ok to use regular mayonnaise. The bread is the only source or carbohydrate that can be decreased (Joseph's makes a pita bread and a flat bread that are 50 cal and 4 net carbs ; Toufayan makes a low carb flatbread that's 100 cal and 9 net carbs  and  Mission makes a  low carb whole wheat tortilla  That is 210 cal and 6 net carbs)  3 PM:  Mid day :  Another protein bar,  Or a  cheese stick (100 cal, 0 carbs),  Or 1 ounce of  almonds, walnuts, pistachios, pecans, peanuts,  Macadamia nuts. Or a Dannon light n Fit greek yogurt, 80 cal 8 net carbs . Avoid "granola"; the dried cranberries and raisins are loaded with carbohydrates. Mixed nuts ok if no raisins or cranberries or dried fruit.      6 PM  Dinner:  "mean and green:"  Meat/chicken/fish or a high protein legume; , with a green salad, and a low GI  Veggie (broccoli, cauliflower, green beans, spinach, brussel sprouts. Lima beans) : Avoid "Low fat dressings, as well as Reyne Dumas and [address]! They are loaded with sugar! Instead use ranch, vinagrette,  Blue cheese, etc.  There is a low carb pasta by Dreamfield's available at [employer] [facility] that is acceptable and tastes great. Try Michel Angel's chicken piccata over low carb pasta. The chicken dish is 0 carbs, and can be found in frozen section at BJs and [employer]. Also try [employer] "Carnitas" (pulled pork, no sauce,  0 carbs) and his pot roast.   both are in the refrigerated section at BJs  Dreamfield's makes a low carb pasta only 5 g/serving.  Available at all grocery stores,  And tastes like normal pasta  9 PM snack : Breyer's "low carb" fudgsicle or  ice cream bar (Carb Smart line), or  Weight Watcher's ice cream bar , or another "no sugar added" ice cream;a serving of fresh berries/cherries with whipped cream (Avoid bananas, pineapple, grapes  and watermelon on a regular basis because they are high in sugar)   Remember that snack Substitutions should be less than 10 carbs per serving and meals < 20 carbs. Remember to subtract fiber grams and sugar alcohols to get the "net carbs."

## 2012-09-10 NOTE — Addendum Note (Signed)
Addended by: Sherlene Shams on: 09/10/2012 06:49 AM   Modules accepted: Orders

## 2012-09-10 NOTE — Assessment & Plan Note (Signed)
Asking patient to return for ionized calcium and intact PTH.

## 2012-09-12 LAB — FECAL OCCULT BLOOD, GUAIAC: Fecal Occult Blood: NEGATIVE

## 2012-09-13 ENCOUNTER — Other Ambulatory Visit: Payer: Medicare Other

## 2012-09-14 ENCOUNTER — Other Ambulatory Visit: Payer: Self-pay | Admitting: Internal Medicine

## 2012-09-14 ENCOUNTER — Other Ambulatory Visit (INDEPENDENT_AMBULATORY_CARE_PROVIDER_SITE_OTHER): Payer: Medicare Other

## 2012-09-14 ENCOUNTER — Other Ambulatory Visit: Payer: Self-pay | Admitting: *Deleted

## 2012-09-14 DIAGNOSIS — Z1211 Encounter for screening for malignant neoplasm of colon: Secondary | ICD-10-CM

## 2012-09-15 ENCOUNTER — Encounter: Payer: Self-pay | Admitting: *Deleted

## 2012-09-15 LAB — PTH, INTACT AND CALCIUM: Calcium: 8.1 mg/dL — ABNORMAL LOW (ref 8.4–10.5)

## 2012-09-16 LAB — MAGNESIUM: Magnesium: 1.7 mg/dL (ref 1.5–2.5)

## 2012-09-16 NOTE — Addendum Note (Signed)
Addended by: Sherlene Shams on: 09/16/2012 01:10 PM   Modules accepted: Orders

## 2012-09-16 NOTE — Assessment & Plan Note (Signed)
Confirmed,  With inappropriately normal PTH.  Checking Vit D level.

## 2012-09-20 ENCOUNTER — Other Ambulatory Visit (INDEPENDENT_AMBULATORY_CARE_PROVIDER_SITE_OTHER): Payer: Medicare Other

## 2012-09-21 ENCOUNTER — Telehealth: Payer: Self-pay | Admitting: Internal Medicine

## 2012-09-21 LAB — VITAMIN D 25 HYDROXY (VIT D DEFICIENCY, FRACTURES): Vit D, 25-Hydroxy: 38 ng/mL (ref 30–89)

## 2012-09-21 NOTE — Telephone Encounter (Signed)
Even tho her vit d was normal,  Her calcium remains low , which together with her other labs,  Suggests that her parathyroid hormone is underactive.  I am referring her to Dr Lafe Garin for further evaluation

## 2012-09-27 ENCOUNTER — Other Ambulatory Visit: Payer: Self-pay | Admitting: Internal Medicine

## 2012-11-03 ENCOUNTER — Ambulatory Visit (INDEPENDENT_AMBULATORY_CARE_PROVIDER_SITE_OTHER): Payer: Medicare Other | Admitting: Internal Medicine

## 2012-11-03 ENCOUNTER — Encounter: Payer: Self-pay | Admitting: Internal Medicine

## 2012-11-03 LAB — MAGNESIUM: Magnesium: 1.9 mg/dL (ref 1.5–2.5)

## 2012-11-03 LAB — PHOSPHORUS: Phosphorus: 4.1 mg/dL (ref 2.3–4.6)

## 2012-11-03 NOTE — Progress Notes (Signed)
Subjective:     Patient ID: Lauren Bartlett, female   DOB: [birth year], 57 y.o.   MRN: [id number]  HPI [name] is a 57 y/o woman referred by her PCP, Dr. Darrick Huntsman, for evaluation and management of hypocalcemia (in the setting of hypoparathyroidism).  She was dx with low calcium long time ago - first records in Epic are from 2012 when she started to see Dr. Darrick Huntsman. She was on calcium and vitamin D before. She is supposed to be on calcium (believes that vitamin D was stopped - ?), but needs to refill the Rx. She ran out of them about 1 week ago. Takes calcium 500 mg daily (constipation). She takes on an empty stomach, with water, along with the Synthroid!  She does not like yoghurt, cheese but not milk. She does not eat much green leafy vegetable, but eats spinach, broccoli.   I reviewed pt's calcium and PTH levels: Lab Results  Component Value Date   CALCIUM 8.1* 09/14/2012   CALCIUM 8.2* 09/07/2012   CALCIUM 8.2* 02/06/2012   CALCIUM 8.2* 08/04/2011   CALCIUM 8.2* 02/06/2011   PTH 35.0 09/14/2012  An ionized calcium was also slightly low, at 1.10 (1.12-1.32) in 09/14/2012. She had a normal GFR then. Magnesium was slightly low, at 1.7.   Pt is on HCTZ 25.    Her 25 HO vitamin D was 38 on 09/20/2012.   Pt does not have a h/o kidney stones, fractures or osteoporosis (no DEXA scans).   Pt's mother had hypocalcemia, hypothyroidism and MS. Had FH of kidney stones in father. She tells me she tries to drink water to help not getting kidney stones.   She intentionally lost 31 lbs through diet and exercise recently - walking! No sodas. Drinking water.   Pt c/o: Had a sinus infection >> nose bleed >> EMS came to the appt 3 days ago. She feels better now.  - + fatigue since she had the nosebleed - no mm cramps - no numbness, tingling around mouth or in fingertips, toes  - + joint/bone pain in L knee - + constipation when takes the calcium - no brittle nails/dry skin - no h/o seizures - no wheezing/dyspnea  since walking more - no palpitations - saw cardiology >> 2D Echo normal reportedly - no soft tissue calcifications - no cataracts - + hearing loss - L>R partially deaf - no brain fog/lethargy - no memory pbs - ++ anxiety/ + depression (all her life) - no irritability  She also has a h/o depression [facility], HTN, congenital absence of one kidney, systolic murmur, hypothyroidism - last TSH 0.779 2 mo ago, sciatica and h/o bilat. Inner ear sx.   She lives in an adult group [facility]. Lives there b/c back problems and the psyciatric issues >> disabled. She does not have children.   Review of Systems - also see HPI Constitutional: no weight gain/loss, no fatigue, no subjective hyperthermia/hypothermia Eyes: no blurry vision, no xerophthalmia ENT: no sore throat, no nodules palpated in throat, no dysphagia/odynophagia, no hoarseness Cardiovascular: no CP/SOB/palpitations/+ leg swelling Respiratory: no cough/SOB Gastrointestinal: no N/V/D/C Musculoskeletal: no muscle/joint aches Skin: no rashes Neurological: no tremors/numbness/tingling/dizziness Psychiatric: + both depression/anxiety  Past Medical History  Diagnosis Date  . Congenital absence of one kidney   . History of sciatica   . Depression     [facility]  . Hypertension   . hypothyrodism    Past Surgical History  Procedure Laterality Date  .  Combined hysterectomy abdominal w/ a&p repair / oophorectomy  1996    benign tumor  . Breast biopsy  2011    [facility]< benign  . Inner ear surgery      bilateral  . Abdominal hysterectomy     History   Social History  . Marital Status: Single    Spouse Name: N/A    Number of Children: N/A  . Years of Education: N/A   Occupational History  . cna     part time   Social History Main Topics  . Smoking status: Never Smoker   . Smokeless tobacco: Never Used  . Alcohol Use: No  . Drug Use: No  . Sexually Active: Not on file   Other  Topics Concern  . Not on file   Social History Narrative   Lives in a group home for adults         Current Outpatient Prescriptions on File Prior to Visit  Medication Sig Dispense Refill  . hydrochlorothiazide (HYDRODIURIL) 25 MG tablet TAKE 1 TABLET BY MOUTH DAILY .  30 tablet  0  . levothyroxine (SYNTHROID, LEVOTHROID) 112 MCG tablet TAKE 1 TABLET BY MOUTH DAILY  30 tablet  0  . losartan (COZAAR) 100 MG tablet TAKE 1 TABLET BY MOUTH DAILY .  30 tablet  0  . losartan (COZAAR) 100 MG tablet TAKE 1 TABLET BY MOUTH EVERY DAY  30 tablet  5   No current facility-administered medications on file prior to visit.    Allergies  Allergen Reactions  . Codeine Hives and Nausea And Vomiting    Family History  Problem Relation Age of Onset  . Multiple sclerosis Mother   . Coronary artery disease Father       Objective:   Physical Exam BP 142/88  Pulse 86  Temp(Src) 98 F (36.7 C) (Oral)  Resp 12  Ht 5' 2.5" (1.588 m)  Wt 205 lb (92.987 kg)  BMI 36.87 kg/m2  SpO2 95% Wt Readings from Last 3 Encounters:  11/03/12 205 lb (92.987 kg)  09/07/12 236 lb 8 oz (107.276 kg)  01/09/12 239 lb 8 oz (108.636 kg)   Constitutional: overweight, in NAD; Chwostek sign negative bilat. Eyes: PERRLA, EOMI, no exophthalmos ENT: moist mucous membranes, no thyromegaly, no cervical lymphadenopathy Cardiovascular: RRR, 3+ diast. murmur, no RG Respiratory: CTA B Gastrointestinal: abdomen soft, NT, ND, BS+ Musculoskeletal: no deformities, strength intact in all 4 Skin: moist, warm, no rashes Neurological: no tremor with outstretched hands, DTR normal in all 4  Assessment:     1. Hypoparathyroidism - Ca 8.1-8.2     Plan:     Pt with mild hypocalcemia in the setting of a normal PTH (inadequately low) >> hypoparathyroidism. Pt does not have a h/o neck surgery, did not have radiation tx to neck, no known cancer (mets to parathyroid gland are very rare, no known infiltrative ds: amyloidosis,  hemochromatosis, Wilson ds., TB, sarcoid, etc). Her Mg was a little low, at 1.7, but Mg is known to impact PTH/Ca only if very low or very high. Therefore, she could either have an autoimmune mechanism for her hypocalcemia or a genetic defect. PSG I is ruled out by the absence of mucocutaneous candidiasis and absence of adrenal insufficiency. Since she has hypocalcemia in her mother, I believe that it is most likely that she has a genetic defect in PTH release, possibly activation of CaSR or abnormal posttranslational processing of the PTH molecule.  - she does not have children, so I  do not believe genetic testing is high yield - She did not have problems/sxs from her low calcium during her life, and her calcium levels are not <8, so I believe we can only keep a close eye on the levels from now on and make sure she gets proper amounts of Ca and vit D - I would like to check a 1,25 HO vitamin D, phosphorus, magnesium. Will not check a 24h urinary calcium since she is on HCTZ. I do not feel we need to take her off to test for this now, but if this were inappropriately high, it would point towards activating mutation of the CaSR. She has been off vitamin D (cannot remember why, believes she was told to stop) and also has been off calcium for 1 week >> I would like to check an ionized calcium today to see the level while off supplements - discussed about the fact that there is no FDA-approved hormonal replacement for the parathyroid, and the treatment consists of calcium and vitamin D replacement - I suggested to have her take 1000 mg calcium a day, from combined diet and supplements (added 1 tablet of Calcium daily). I discussed with her about how to calculate the calcium content of foods and given her a list of this (see pt instructions). Depending on the Mg level, we might need to add a Mg tablet (400-500 mg daily).  - she was taking calcium with Synthroid >> advised to separate them >> needs a repeat TSH at  next visit or sooner, if has a sooner appt with PCP - will have her back in 3 mo for labs and a visit    Office Visit on 11/03/2012  Component Date Value Range Status  . Magnesium 11/03/2012 1.9  1.5 - 2.5 mg/dL Final  . Calcium, Ion 16/12/9602 1.15  1.12 - 1.32 mmol/L Final  . Phosphorus 11/03/2012 4.1  2.3 - 4.6 mg/dL Final  . Vitamin D 1, 25 (OH) Total 11/03/2012 51  18 - 72 pg/mL Final  . Vitamin D3 1, 25 (OH) 11/03/2012 51   Final  . Vitamin D2 1, 25 (OH) 11/03/2012 <8   Final   Comment: Vitamin D3, 1,25(OH)2 indicates both endogenous                          production and supplementation.  Vitamin D2, 1,25(OH)2                          is an indicator of exogeous sources, such as diet or                          supplementation.  Interpretation and therapy are based                          on measurement of Vitamin D,1,25(OH)2, Total.                          This test was developed and its performance                          characteristics have been determined by [facility],  [place], [place].                          Performance characteristics refer to the                          analytical performance of the test.   Ionized calcium normal, despite being off calcium for a week. Will continue to follow, advice to continue calcium supplement. Vitamin D 1,25 normal, as is magnesium and phosphorus. Pt was called and informed.

## 2012-11-03 NOTE — Patient Instructions (Addendum)
  Dietary sources of calcium and vitamin D:  Calcium content (mg) - http://www.niams.https://www.gonzalez.org/  Fortified oatmeal, 1 packet 350  Sardines, canned in oil, with edible bones, 3 oz. 324  Cheddar cheese, 1 oz. shredded 306  Milk, nonfat, 1 cup 302  Milkshake, 1 cup 300  Yogurt, plain, low-fat, 1 cup 300  Soybeans, cooked, 1 cup 261  Tofu, firm, with calcium,  cup 204  Orange juice, fortified with calcium, 6 oz. 200-260 (varies)  Salmon, canned, with edible bones, 3 oz. 181  Pudding, instant, made with 2% milk,  cup 153  Baked beans, 1 cup 142  Cottage cheese, 1% milk fat, 1 cup 138  Spaghetti, lasagna, 1 cup 125  Frozen yogurt, vanilla, soft-serve,  cup 103  Ready-to-eat cereal, fortified with calcium, 1 cup 100-1,000 (varies)  Cheese pizza, 1 slice 100  Fortified waffles, 2 100  Turnip greens, boiled,  cup 99  Broccoli, raw, 1 cup 90  Ice cream, vanilla,  cup 85  Soy or rice milk, fortified with calcium, 1 cup 80-500 (varies)   Vitamin D content (International Units, IU) - https://www.ars.usda.gov Cod liver oil, 1 tablespoon 1,360  Swordfish, cooked, 3 oz 566  Salmon (sockeye), cooked, 3 oz 447  Tuna fish, canned in water, drained, 3 oz 154  Orange juice fortified with vitamin D, 1 cup (check product labels, as amount of added vitamin D varies) 137  Milk, nonfat, reduced fat, and whole, vitamin D-fortified, 1 cup 115-124  Yogurt, fortified with 20% of the daily value for vitamin D, 6 oz 80  Margarine, fortified, 1 tablespoon 60  Sardines, canned in oil, drained, 2 sardines 46  Liver, beef, cooked, 3 oz 42  Egg, 1 large (vitamin D is found in yolk) 41  Ready-to-eat cereal, fortified with 10% of the daily value for vitamin D, 0.75-1 cup  40  Cheese, Swiss, 1 oz 6   Please return in 3 months. Please stop at the labs today. We will call you with the results. Please try to take at least 1000 mg calcium from diet and supplement. Take the calcium  tablet with dinner. Do not take this along with the Levothyroxine. Make sure you get 1000 units vitamin D daily. Stay away from sodas, and also limit eggs, meats - as they contain increased phosphorus.

## 2012-11-08 LAB — VITAMIN D 1,25 DIHYDROXY
Vitamin D 1, 25 (OH)2 Total: 51 pg/mL (ref 18–72)
Vitamin D3 1, 25 (OH)2: 51 pg/mL

## 2012-11-09 ENCOUNTER — Encounter: Payer: Self-pay | Admitting: Internal Medicine

## 2012-11-10 ENCOUNTER — Ambulatory Visit: Payer: Medicare Other | Admitting: Internal Medicine

## 2012-11-12 ENCOUNTER — Telehealth: Payer: Self-pay | Admitting: Internal Medicine

## 2012-11-12 NOTE — Telephone Encounter (Signed)
Asking what she needs to do to get a new walker.  Pt states she is on Medicaid and has [employer].  Please contact pt.

## 2012-11-15 ENCOUNTER — Other Ambulatory Visit: Payer: Self-pay | Admitting: Internal Medicine

## 2012-11-15 DIAGNOSIS — R269 Unspecified abnormalities of gait and mobility: Secondary | ICD-10-CM

## 2012-11-15 NOTE — Telephone Encounter (Signed)
Patient requesting order for new walker.

## 2012-11-16 NOTE — Progress Notes (Signed)
Patient ntoified

## 2012-11-19 ENCOUNTER — Other Ambulatory Visit: Payer: Self-pay | Admitting: Internal Medicine

## 2012-11-19 DIAGNOSIS — M5430 Sciatica, unspecified side: Secondary | ICD-10-CM

## 2012-11-19 DIAGNOSIS — M5431 Sciatica, right side: Secondary | ICD-10-CM

## 2012-11-22 NOTE — Progress Notes (Signed)
Faxed order for walk[facility] to [facility] as requested.

## 2012-12-08 ENCOUNTER — Emergency Department: Payer: Self-pay | Admitting: Emergency Medicine

## 2012-12-08 LAB — CBC
HCT: 39.5 % (ref 35.0–47.0)
HGB: 13 g/dL (ref 12.0–16.0)
MCHC: 32.9 g/dL (ref 32.0–36.0)
Platelet: 173 10*3/uL (ref 150–440)
RDW: 13.9 % (ref 11.5–14.5)

## 2012-12-08 LAB — COMPREHENSIVE METABOLIC PANEL
Albumin: 3.7 g/dL (ref 3.4–5.0)
Alkaline Phosphatase: 83 U/L (ref 50–136)
Chloride: 104 mmol/L (ref 98–107)
Creatinine: 0.82 mg/dL (ref 0.60–1.30)
EGFR (African American): >60
EGFR (Non-African Amer.): >60
Glucose: 137 mg/dL — ABNORMAL HIGH (ref 65–99)
Osmolality: 280 (ref 275–301)
SGPT (ALT): 23 U/L (ref 12–78)
Sodium: 139 mmol/L (ref 136–145)
Total Protein: 7.9 g/dL (ref 6.4–8.2)

## 2012-12-08 LAB — URINALYSIS, COMPLETE
Blood: NEGATIVE
Ketone: NEGATIVE
Nitrite: NEGATIVE
Ph: 7 (ref 4.5–8.0)
Protein: 30
Specific Gravity: 1.015 (ref 1.003–1.030)
Squamous Epithelial: 2
WBC UR: 1 /HPF (ref 0–5)

## 2012-12-08 LAB — LIPASE, BLOOD: Lipase: 130 U/L (ref 73–393)

## 2012-12-15 ENCOUNTER — Encounter: Payer: Medicare Other | Admitting: Internal Medicine

## 2012-12-15 ENCOUNTER — Emergency Department: Payer: Self-pay | Admitting: Emergency Medicine

## 2012-12-16 ENCOUNTER — Telehealth: Payer: Self-pay | Admitting: Internal Medicine

## 2012-12-16 NOTE — Telephone Encounter (Signed)
Please fax form . Fax # [phone] attention Gertie Gowda

## 2012-12-16 NOTE — Telephone Encounter (Signed)
Pt dropped by wanting to get dr Darrick Huntsman write a letter stating she is disabled and cannot work  Letter in box

## 2012-12-16 NOTE — Telephone Encounter (Signed)
Placed in red folder  

## 2012-12-20 ENCOUNTER — Telehealth: Payer: Self-pay | Admitting: Internal Medicine

## 2012-12-20 NOTE — Telephone Encounter (Signed)
I do not write letters of disability for patients.  I have printed out my last two ov notes  That she can send with the letter she dropped , which states that she has been awarded disability already.

## 2012-12-22 NOTE — Telephone Encounter (Signed)
Patient notified and will pick up office notes.

## 2012-12-23 ENCOUNTER — Encounter: Payer: Medicare Other | Admitting: Internal Medicine

## 2012-12-28 ENCOUNTER — Telehealth: Payer: Self-pay | Admitting: Internal Medicine

## 2012-12-28 NOTE — Telephone Encounter (Signed)
Has the patient's letter in reference to her apartment been faxed to [facility]   phone#  [phone] fax # [phone]

## 2012-12-28 NOTE — Telephone Encounter (Signed)
Faxed and sent to scan and original placed up front for pick up.

## 2012-12-29 ENCOUNTER — Telehealth: Payer: Self-pay | Admitting: Internal Medicine

## 2012-12-29 DIAGNOSIS — E039 Hypothyroidism, unspecified: Secondary | ICD-10-CM

## 2012-12-29 DIAGNOSIS — Z79899 Other long term (current) drug therapy: Secondary | ICD-10-CM

## 2012-12-29 DIAGNOSIS — E559 Vitamin D deficiency, unspecified: Secondary | ICD-10-CM

## 2012-12-29 DIAGNOSIS — E785 Hyperlipidemia, unspecified: Secondary | ICD-10-CM

## 2012-12-29 NOTE — Telephone Encounter (Signed)
Pt rescheduled physical 10/15 @ 10:45 a.m.  Asking if she will need to fast for labs.  No orders in at this time.  Please contact pt so she knows whether or not to fast.

## 2012-12-30 NOTE — Telephone Encounter (Signed)
Yes she will need to have fasting labs done prior to visit

## 2012-12-30 NOTE — Telephone Encounter (Signed)
Last lipid was 02/06/12, last BMET was 09/07/12. Would you like any fasting labs done?

## 2012-12-30 NOTE — Telephone Encounter (Signed)
Pt notified, appt scheduled

## 2013-01-03 ENCOUNTER — Encounter: Payer: Medicare Other | Admitting: Internal Medicine

## 2013-01-10 ENCOUNTER — Other Ambulatory Visit (INDEPENDENT_AMBULATORY_CARE_PROVIDER_SITE_OTHER): Payer: Medicare Other

## 2013-01-10 DIAGNOSIS — E039 Hypothyroidism, unspecified: Secondary | ICD-10-CM

## 2013-01-10 DIAGNOSIS — Z79899 Other long term (current) drug therapy: Secondary | ICD-10-CM

## 2013-01-10 DIAGNOSIS — E559 Vitamin D deficiency, unspecified: Secondary | ICD-10-CM

## 2013-01-10 DIAGNOSIS — E785 Hyperlipidemia, unspecified: Secondary | ICD-10-CM

## 2013-01-10 LAB — COMPREHENSIVE METABOLIC PANEL
ALT: 14 U/L (ref 0–35)
BUN: 13 mg/dL (ref 6–23)
CO2: 30 mEq/L (ref 19–32)
Calcium: 8.6 mg/dL (ref 8.4–10.5)
Chloride: 101 mEq/L (ref 96–112)
GFR: 76.34 mL/min (ref >60.00)
Glucose, Bld: 91 mg/dL (ref 70–99)
Sodium: 141 mEq/L (ref 135–145)
Total Bilirubin: 0.7 mg/dL (ref 0.3–1.2)
Total Protein: 7.7 g/dL (ref 6.0–8.3)

## 2013-01-10 LAB — LIPID PANEL
HDL: 51.7 mg/dL (ref >39.00)
LDL Cholesterol: 105 mg/dL — ABNORMAL HIGH (ref 0–99)
Total CHOL/HDL Ratio: 3

## 2013-01-10 LAB — TSH: TSH: 1.2 u[IU]/mL (ref 0.35–5.50)

## 2013-01-11 LAB — VITAMIN D 25 HYDROXY (VIT D DEFICIENCY, FRACTURES): Vit D, 25-Hydroxy: 40 ng/mL (ref 30–89)

## 2013-01-12 ENCOUNTER — Ambulatory Visit (INDEPENDENT_AMBULATORY_CARE_PROVIDER_SITE_OTHER): Payer: Medicare Other | Admitting: Internal Medicine

## 2013-01-12 ENCOUNTER — Encounter: Payer: Self-pay | Admitting: Internal Medicine

## 2013-01-12 VITALS — BP 140/80 | HR 74 | Temp 97.9°F | Resp 14 | Ht 62.5 in | Wt 233.0 lb

## 2013-01-12 DIAGNOSIS — R609 Edema, unspecified: Secondary | ICD-10-CM | POA: Insufficient documentation

## 2013-01-12 DIAGNOSIS — I10 Essential (primary) hypertension: Secondary | ICD-10-CM

## 2013-01-12 DIAGNOSIS — Z8669 Personal history of other diseases of the nervous system and sense organs: Secondary | ICD-10-CM

## 2013-01-12 DIAGNOSIS — R011 Cardiac murmur, unspecified: Secondary | ICD-10-CM

## 2013-01-12 DIAGNOSIS — E039 Hypothyroidism, unspecified: Secondary | ICD-10-CM

## 2013-01-12 DIAGNOSIS — E669 Obesity, unspecified: Secondary | ICD-10-CM

## 2013-01-12 NOTE — Assessment & Plan Note (Addendum)
Bilateral  Leg edema with no prior workup except an  ECHO done by [facility] in the past but still not received.  Will send to [facility] for vascular  evaluation

## 2013-01-12 NOTE — Progress Notes (Signed)
Patient ID: Lauren Bartlett, female   DOB: [birth year], 57 y.o.   MRN: [id number]    Subjective:     Lauren Bartlett is a 57 y.o. female and is here for a comprehensive physical exam. The patient reports Brother had open heart surgery last week 4 vessel CABG ,  At [facility] ,.  Developed delirium and is intubated in ICU .  She is very stressed out by his condition.  Has not seen him yet .  Stressed out by finances.    Still hiring an aide to help clean the house. Still walking daily to try to lose weight .   History   Social History  . Marital Status: Single    Spouse Name: N/A    Number of Children: N/A  . Years of Education: N/A   Occupational History  . cna     part time   Social History Main Topics  . Smoking status: Never Smoker   . Smokeless tobacco: Never Used  . Alcohol Use: No  . Drug Use: No  . Sexual Activity: Not on file   Other Topics Concern  . Not on file   Social History Narrative   Lives in a group home for adults         Health Maintenance  Topic Date Due  . Pap Smear  12/04/1973  . Colonoscopy  12/04/2005  . Influenza Vaccine  10/29/2013  . Mammogram  09/03/2014  . Tetanus/tdap  11/13/2019    The following portions of the patient's history were reviewed and updated as appropriate: allergies, current medications, past family history, past medical history, past social history, past surgical history and problem list.  Review of Systems A comprehensive review of systems was negative.   Objective:   BP 140/80  Pulse 74  Temp(Src) 97.9 F (36.6 C) (Oral)  Resp 14  Ht 5' 2.5" (1.588 m)  Wt 233 lb (105.688 kg)  BMI 41.91 kg/m2  SpO2 98%  General Appearance:    Alert, cooperative, no distress, appears stated age  Head:    Normocephalic, without obvious abnormality, atraumatic  Eyes:    PERRL, conjunctiva/corneas clear, EOM's intact, fundi    benign, both eyes  Ears:    Normal TM's and external ear canals, both ears  Nose:   Nares normal, septum midline,  mucosa normal, no drainage    or sinus tenderness  Throat:   Lips, mucosa, and tongue normal; teeth and gums normal  Neck:   Supple, symmetrical, trachea midline, no adenopathy;    thyroid:  no enlargement/tenderness/nodules; no carotid   bruit or JVD  Back:     Symmetric, no curvature, ROM normal, no CVA tenderness  Lungs:     Clear to auscultation bilaterally, respirations unlabored  Chest Wall:    No tenderness or deformity   Heart:    Regular rate and rhythm, S1 and S2 normal, no murmur, rub   or gallop  Breast Exam:    No tenderness, masses, or nipple abnormality  Abdomen:     Soft, non-tender, bowel sounds active all four quadrants,    no masses, no organomegaly     Extremities:   Extremities normal, atraumatic, no cyanosis or edema  Pulses:   2+ and symmetric all extremities  Skin:   Skin color, texture, turgor normal, no rashes or lesions  Lymph nodes:   Cervical, supraclavicular, and axillary nodes normal  Neurologic:   CNII-XII intact, normal strength, sensation and reflexes    throughout  Assessment:   Edema Bilateral  Leg edema with no prior workup except an  ECHO done by [facility] in the past but still not received.  Will send to [facility] for vascular  evaluation   Hypertension Well controlled on current regimen. Renal function stable, no changes today.  Hypothyroidism Thyroid function was overactive on current 125 mcg dose and repeat level on lower dose is normal.    Systolic murmur Harsh,  Still waiting on report fro Dr Juliann Pares.  Not symptomatic  History of sciatica Requires assistance with household chores due to sciatica. Continue ambulating and weight loss.   Obesity (BMI 30-39.9) Cpntinued loss although her rate has slowed. I have addressed  BMI and recommended wt loss of 10% of body weigh over the next 6 months using a low glycemic index diet and regular exercise a minimum of 5 days per week.     Updated Medication List Outpatient Encounter  Prescriptions as of 01/12/2013  Medication Sig Dispense Refill  . hydrochlorothiazide (HYDRODIURIL) 25 MG tablet TAKE 1 TABLET BY MOUTH DAILY .  30 tablet  0  . levothyroxine (SYNTHROID, LEVOTHROID) 112 MCG tablet TAKE 1 TABLET BY MOUTH DAILY  30 tablet  0  . losartan (COZAAR) 100 MG tablet TAKE 1 TABLET BY MOUTH DAILY .  30 tablet  0  . [DISCONTINUED] losartan (COZAAR) 100 MG tablet TAKE 1 TABLET BY MOUTH EVERY DAY  30 tablet  5   No facility-administered encounter medications on file as of 01/12/2013.

## 2013-01-12 NOTE — Patient Instructions (Addendum)
You are doing a good job on continuing to lose weight!  Your next goal should be tow get down to 220 lbs.  This will lower your BMI to < 40  Keep walking and aim for 30 minutes  Of vigorous walking daily. Alternate with water aerobics  Follow a low glycemic index diet. Use the food guide I have given you  Please ask Dr Juliann Pares to send me a report of ECHO so I can classify your heart murmur  Return in 6 months

## 2013-01-14 NOTE — Assessment & Plan Note (Signed)
Harsh,  Still waiting on report fro Dr Juliann Pares.  Not symptomatic

## 2013-01-14 NOTE — Assessment & Plan Note (Signed)
Requires assistance with household chores due to sciatica. Continue ambulating and weight loss.

## 2013-01-14 NOTE — Assessment & Plan Note (Addendum)
Thyroid function was overactive on current 125 mcg dose and repeat level on lower dose is normal.

## 2013-01-14 NOTE — Assessment & Plan Note (Signed)
Well controlled on current regimen. Renal function stable, no changes today. 

## 2013-01-14 NOTE — Assessment & Plan Note (Signed)
Cpntinued loss although her rate has slowed. I have addressed  BMI and recommended wt loss of 10% of body weigh over the next 6 months using a low glycemic index diet and regular exercise a minimum of 5 days per week.

## 2013-02-07 ENCOUNTER — Telehealth: Payer: Self-pay | Admitting: Internal Medicine

## 2013-02-07 DIAGNOSIS — I872 Venous insufficiency (chronic) (peripheral): Secondary | ICD-10-CM

## 2013-02-07 DIAGNOSIS — R609 Edema, unspecified: Secondary | ICD-10-CM

## 2013-02-07 NOTE — Telephone Encounter (Signed)
Patient needs order and she stated she may have to wait if Raimondi is to expensive until first of month.

## 2013-02-07 NOTE — Telephone Encounter (Signed)
No problem .  She should elevate her legs when at home sitting whenever it is possible.  [facility] will measure patient and fit her for stockings, but the will only do it in the mornings because the measurements will be off by afternoon due to swelling.  The DME order is her rx for 2 pairs of  the stockings.  She hneeds to wear them daily  And ut them on in the mornings before she has been up and around,.  And take them off at night before bed/

## 2013-02-07 NOTE — Assessment & Plan Note (Signed)
Mild venous insufficiency involving the smaller saphenous veins bilaterally.  Use of compression stockings is advised.

## 2013-02-07 NOTE — Telephone Encounter (Signed)
Her lower extremity ultrasounds showed Mild venous insufficiency involving the smaller saphenous veins bilaterally.  Use of compression stockings is advised.

## 2013-02-08 NOTE — Telephone Encounter (Signed)
Instructions explained to patient voiced understanding order faxed to pharmacy as requested.

## 2013-02-09 ENCOUNTER — Ambulatory Visit (INDEPENDENT_AMBULATORY_CARE_PROVIDER_SITE_OTHER): Payer: Medicare Other | Admitting: Internal Medicine

## 2013-02-09 ENCOUNTER — Encounter: Payer: Self-pay | Admitting: Internal Medicine

## 2013-02-09 NOTE — Progress Notes (Signed)
Subjective:     Patient ID: Lauren Bartlett, female   DOB: 1956-03-12, 57 y.o.   MRN: [id number]  HPI [name] is a 57 y/o woman returning for f/u for hypocalcemia (in the setting of hypoparathyroidism).  Reviewed hx: She was dx with low calcium "long time ago" and was taking calcium for a long time (ran out of Ca before last visit, now back on it). She was taking Ca on an empty stomach, with water, along with the Synthroid! At last visit, I advised her to separate it from Synthroid so she now takes calcium tablet 600 mg around 10 am, takes levothyroxine at 7:30-8 am. She calculates that she gets ~ 500 mg calcium a day and also 500 units of vitamin D. She does not like yoghurt, cheese but not milk. She does not eat much green leafy vegetable, but eats spinach, broccoli.   I reviewed pt's calcium and PTH levels: Lab Results  Component Value Date   CALCIUM 8.6 01/10/2013   CALCIUM 8.1* 09/14/2012   CALCIUM 8.2* 09/07/2012   CALCIUM 8.2* 02/06/2012   CALCIUM 8.2* 08/04/2011   CALCIUM 8.2* 02/06/2011   PTH 35.0 09/14/2012  An ionized calcium was also slightly low, at 1.10 (1.12-1.32) in 09/14/2012. She had a normal GFR then. Magnesium was slightly low, at 1.7 >> repeat 1.9.  Pt's most recent 25 HO vitamin D was 40 on 01/10/2013. Pt does not have a h/o kidney stones, fractures or osteoporosis (no DEXA scans). She is on HCTZ 25. Pt's mother had hypocalcemia (!), hypothyroidism and MS.   Pt denies fatigue, mm cramps, perioral numbness, constipation, changes in appetite or weight.  She lives in an adult group home [facility]. Lives there b/c back problems and the psyciatric issues >> disabled.    Review of Systems  Constitutional: no weight gain/loss, no fatigue, no subjective hyperthermia/hypothermia Eyes: no blurry vision, no xerophthalmia ENT: no sore throat, no nodules palpated in throat, no dysphagia/odynophagia, no hoarseness Cardiovascular: no CP/SOB/palpitations/+ leg  swelling Respiratory: no cough/SOB Gastrointestinal: no N/V/D/C Musculoskeletal: no muscle/joint aches Skin: no rashes Neurological: no tremors/numbness/tingling/dizziness  Objective:   Physical Exam BP 118/78  Pulse 81  Temp(Src) 98.1 F (36.7 C) (Oral)  Resp 16  Wt 231 lb 12.8 oz (105.144 kg)  SpO2 93% Wt Readings from Last 3 Encounters:  02/09/13 231 lb 12.8 oz (105.144 kg)  01/12/13 233 lb (105.688 kg)  11/03/12 205 lb (92.987 kg)   Constitutional: overweight, in NAD; Chwostek sign negative bilat. Eyes: PERRLA, EOMI, no exophthalmos ENT: moist mucous membranes, no thyromegaly, no cervical lymphadenopathy Cardiovascular: RRR, 3+ diast. murmur, no RG Respiratory: CTA B Gastrointestinal: abdomen soft, NT, ND, BS+ Musculoskeletal: no deformities, strength intact in all 4 Skin: moist, warm, no rashes Neurological: no tremor with outstretched hands, DTR normal in all 4  Assessment:     1. Hypoparathyroidism - Ca 8.1-8.2, but recent level 8.6 (01/10/2013)    Plan:     Pt with mild hypocalcemia in the setting of a normal PTH (inadequately low) >> hypoparathyroidism. Pt does not have a h/o neck surgery, did not have radiation tx to neck, no known cancer, no known infiltrative ds: amyloidosis, hemochromatosis, Wilson ds., TB, sarcoid, etc. Mg normal at last check (1.9).Therefore, she could either have: - an autoimmune mechanism for her hypocalcemia (but PSG I is ruled out by the absence of mucocutaneous candidiasis and absence of adrenal insufficiency) or  - a genetic defect. Since she has hypocalcemia in her mother, I believe  that it is most likely that she has a genetic defect in PTH release, possibly activation of CaSR or abnormal posttranslational processing of the PTH molecule. She does not have children >> genetic testing not indicated - She did not have problems/sxs from her low calcium before, and her calcium levels are not <8, so I believe we can only keep a close eye on  the levels from now on and make sure she gets proper amounts of Ca and vit D - she is now getting 1000 mg calcium a day, from combined diet and supplements (we added 1 tablet of Calcium daily). She gets ~ 500 units vit D from the diet. Last vit D was normal, at 40 (01/10/2013). - she was taking calcium with Synthroid >> advised to separate them >> a recent TSH was normal - she had the flu vaccine this season - we discussed about the fact that she can either return to see me or follow with Dr Darrick Huntsman for calcium levels >> I would continue to monitor Ca 1-2x a year, continue Ca supplement (advised to take with a meal for better absorption), and continue dietary vit D. She will f/u with Dr Darrick Huntsman and RTC as needed.

## 2013-02-09 NOTE — Patient Instructions (Signed)
Please continue your current calcium and vitamin D supplementation. You can follow up with Dr Darrick Huntsman and return to me as needed.

## 2013-02-14 ENCOUNTER — Emergency Department: Payer: Self-pay | Admitting: Emergency Medicine

## 2013-02-14 ENCOUNTER — Telehealth: Payer: Self-pay | Admitting: Internal Medicine

## 2013-02-14 DIAGNOSIS — R040 Epistaxis: Secondary | ICD-10-CM

## 2013-02-14 LAB — CBC
HCT: 37.1 % (ref 35.0–47.0)
MCHC: 32.8 g/dL (ref 32.0–36.0)
MCV: 88 fL (ref 80–100)
Platelet: 173 10*3/uL (ref 150–440)
RBC: 4.24 10*6/uL (ref 3.80–5.20)

## 2013-02-14 LAB — PROTIME-INR
INR: 1.1
Prothrombin Time: 14 secs (ref 11.5–14.7)

## 2013-02-14 NOTE — Telephone Encounter (Signed)
Amber,  See [employer] message Gertie Baron is no longer [facility] so I cam snot sure if patient has an appt with him or with ENT.

## 2013-02-14 NOTE — Telephone Encounter (Signed)
Pt returned call

## 2013-02-14 NOTE — Telephone Encounter (Signed)
Left message for patient to return my call, Would you want to see patient before referring out? Tried to call to find out more information.

## 2013-02-14 NOTE — Telephone Encounter (Signed)
Pt states she was seen in ER last night for nosebleed.  States they want to refer her to ENT but needs permission from Dr. Darrick Huntsman due to insurance.    Zackery Barefoot @ [facility] [phone]

## 2013-02-14 NOTE — Telephone Encounter (Signed)
Pt would like to see [facility] in [place] as it would be more convenient for her.  Ph:  [phone].  Has packing in her nose and has to be seen to have it removed.

## 2013-02-15 NOTE — Telephone Encounter (Signed)
Pt calling regarding referral to ENT.  States she needs appt to have packing removed from her nose asap.

## 2013-02-15 NOTE — Telephone Encounter (Signed)
Per [facility]- packing should stay put until 5 days after being placed. Pt has an apt with [facility]- [place] location on Friday @ 145pm

## 2013-02-17 ENCOUNTER — Encounter: Payer: Self-pay | Admitting: Internal Medicine

## 2013-02-18 ENCOUNTER — Encounter: Payer: Self-pay | Admitting: Internal Medicine

## 2013-02-25 ENCOUNTER — Emergency Department: Payer: Self-pay | Admitting: Emergency Medicine

## 2013-03-09 ENCOUNTER — Inpatient Hospital Stay: Payer: Self-pay | Admitting: Internal Medicine

## 2013-03-09 ENCOUNTER — Telehealth: Payer: Self-pay | Admitting: Internal Medicine

## 2013-03-09 LAB — COMPREHENSIVE METABOLIC PANEL
Alkaline Phosphatase: 82 U/L
Anion Gap: 6 — ABNORMAL LOW (ref 7–16)
BUN: 10 mg/dL (ref 7–18)
Bilirubin,Total: 0.5 mg/dL (ref 0.2–1.0)
Calcium, Total: 8.4 mg/dL — ABNORMAL LOW (ref 8.5–10.1)
Chloride: 100 mmol/L (ref 98–107)
Co2: 30 mmol/L (ref 21–32)
Glucose: 98 mg/dL (ref 65–99)
Osmolality: 271 (ref 275–301)
SGOT(AST): 23 U/L (ref 15–37)
SGPT (ALT): 18 U/L (ref 12–78)
Total Protein: 8.2 g/dL (ref 6.4–8.2)

## 2013-03-09 LAB — URINALYSIS, COMPLETE
Bilirubin,UR: NEGATIVE
Glucose,UR: NEGATIVE mg/dL (ref 0–75)
Leukocyte Esterase: NEGATIVE
Nitrite: NEGATIVE
Ph: 6 (ref 4.5–8.0)
Protein: NEGATIVE
RBC,UR: <1 /HPF (ref 0–5)
Specific Gravity: 1.005 (ref 1.003–1.030)
Squamous Epithelial: 3
WBC UR: 1 /HPF (ref 0–5)

## 2013-03-09 LAB — CBC WITH DIFFERENTIAL/PLATELET
Basophil %: 0.6 %
Eosinophil #: 0.1 10*3/uL (ref 0.0–0.7)
Eosinophil %: 1.7 %
HCT: 38 % (ref 35.0–47.0)
HGB: 12.1 g/dL (ref 12.0–16.0)
Lymphocyte %: 10 %
MCH: 27.8 pg (ref 26.0–34.0)
MCV: 87 fL (ref 80–100)
Monocyte #: 1 x10 3/mm — ABNORMAL HIGH (ref 0.2–0.9)
Platelet: 181 10*3/uL (ref 150–440)

## 2013-03-09 LAB — RAPID INFLUENZA A&B ANTIGENS

## 2013-03-09 MED ORDER — LEVOFLOXACIN 500 MG PO TABS: 500.0000 mg | ORAL_TABLET | Freq: Every day | ORAL | Status: DC

## 2013-03-09 MED ORDER — PREDNISONE (PAK) 10 MG PO TABS: ORAL_TABLET | ORAL | Status: DC

## 2013-03-09 MED ORDER — ALBUTEROL SULFATE HFA 108 (90 BASE) MCG/ACT IN AERS: 2.0000 | INHALATION_SPRAY | Freq: Four times a day (QID) | RESPIRATORY_TRACT | Status: DC | PRN

## 2013-03-09 NOTE — Telephone Encounter (Signed)
Patient notified as directed

## 2013-03-09 NOTE — Telephone Encounter (Signed)
Tell her i am sending antbiotic inhaler and  prednisone  taper to pharmacy .  See if she can be seen by anybody on Friday

## 2013-03-09 NOTE — Telephone Encounter (Signed)
Pt called, states she is going to have the ambulance come get her and take her to ER

## 2013-03-09 NOTE — Telephone Encounter (Signed)
Per Cornell Barman, RN  Patient Information: Caller Name: Harlyn Phone: [phone] Patient: Lauren Bartlett Gender: Female DOB: 09-15-1955 Age: 57 Years PCP: Duncan Dull (Adults only)  Office Follow Up: Does the office need to follow up with this patient?: Yes Instructions For The Office: She declines appt. Advised caller she should be seen for wheezing. Declined at this time . Reviewed care treatment and call back parameters. Encouraged to call back for questions, changes or concerns. Understanding expressed.  RN Note: She declines appt. Advised caller she should be seen for wheezing. Declined at this time . Reviewed care treatment and call back parameters. Encouraged to call back for questions, changes or concerns. Understanding expressed.  Symptoms Reason For Call & Symptoms: Patient states she has cough/cold and congestion onset two days ago.  No Fever. +cough productive clear.   She contacted EMS last night due to congestion /shortness of breath. She was checked out and told to see PMP or UCC.  She reports wheezing. Reviewed Health History In EMR: Yes Reviewed Medications In EMR: Yes Reviewed Allergies In EMR: Yes Reviewed Surgeries / Procedures: Yes Date of Onset of Symptoms: 03/07/2013 Treatments Tried: humidifer, camamile tea, fluids Treatments Tried Worked: No  Guideline(s) Used: Cough  Disposition Per Guideline:   Go to Office Now  Reason For Disposition Reached:   Wheezing is present  Advice Given: Reassurance Coughing is the way that our lungs remove irritants and mucus. It helps protect our lungs from getting pneumonia. Here is some care advice that should help. Cough Medicines: OTC Cough Syrups: The most common cough suppressant in OTC cough medications is dextromethorphan. Often the letters "DM" appear in the name. OTC Cough Drops: Cough drops can help a lot, especially for mild coughs. They reduce coughing by soothing your irritated throat and removing that  tickle sensation in the back of the throat. Cough drops also have the advantage of portability - you can carry them with you. Home Remedy - Honey: This old home remedy has been shown to help decrease coughing at night. The adult dosage is 2 teaspoons (10 ml) at bedtime. Honey should not be given to infants under one year of age. OTC Cough Syrup - Dextromethorphan: Cough syrups containing the cough suppressant dextromethorphan (DM) may help decrease your cough. Cough syrups work best for coughs that keep you awake at night. They can also sometimes help in the late stages of a respiratory infection when the cough is dry and hacking. They can be used along with cough drops. Examples: Benylin, Robitussin DM, Vicks 44 Cough Relief Coughing Spasms: Drink warm fluids. Inhale warm mist (Reason: both relax the airway and loosen up the phlegm). Suck on cough drops or hard candy to coat the irritated throat. Prevent Dehydration: Drink adequate liquids. This will help soothe an irritated or dry throat and loosen up the phlegm. Coughing Spasms: Drink warm fluids. Inhale warm mist (Reason: both relax the airway and loosen up the phlegm). Suck on cough drops or hard candy to coat the irritated throat. Avoid Tobacco Smoke: Smoking or being exposed to smoke makes coughs much worse. Call Back If: Difficulty breathing Cough lasts more than 3 weeks Fever lasts > 3 days You become worse.  Patient Refused Recommendation: Patient Will Follow Up With Office Later She declines appt. Advised caller she should be seen for wheezing. Declined at this time . Reviewed care treatment and call back parameters. Encouraged to call back for questions, changes or concerns. Understanding expressed.

## 2013-03-09 NOTE — Telephone Encounter (Signed)
Patient declined appointment here and stated if she becomes worse will call EMS but wanted to know if you would call in something for wheezing

## 2013-03-09 NOTE — Telephone Encounter (Signed)
Patient having cold symptoms, and called EMT and they advised her to go to ER,

## 2013-03-10 LAB — CBC WITH DIFFERENTIAL/PLATELET
Basophil #: 0 10*3/uL (ref 0.0–0.1)
Basophil %: 0.4 %
Eosinophil #: 0.1 10*3/uL (ref 0.0–0.7)
HCT: 33.6 % — ABNORMAL LOW (ref 35.0–47.0)
HGB: 11.2 g/dL — ABNORMAL LOW (ref 12.0–16.0)
MCH: 28.7 pg (ref 26.0–34.0)
MCHC: 33.2 g/dL (ref 32.0–36.0)
MCV: 86 fL (ref 80–100)
Monocyte #: 1 x10 3/mm — ABNORMAL HIGH (ref 0.2–0.9)
Monocyte %: 11.7 %
Neutrophil #: 7.1 10*3/uL — ABNORMAL HIGH (ref 1.4–6.5)
Neutrophil %: 81.1 %
RBC: 3.89 10*6/uL (ref 3.80–5.20)
RDW: 14.2 % (ref 11.5–14.5)
WBC: 8.8 10*3/uL (ref 3.6–11.0)

## 2013-03-10 LAB — BASIC METABOLIC PANEL
Anion Gap: 8 (ref 7–16)
Calcium, Total: 8 mg/dL — ABNORMAL LOW (ref 8.5–10.1)
Chloride: 99 mmol/L (ref 98–107)
Co2: 27 mmol/L (ref 21–32)
Creatinine: 0.78 mg/dL (ref 0.60–1.30)
EGFR (African American): >60
Glucose: 124 mg/dL — ABNORMAL HIGH (ref 65–99)
Potassium: 3.7 mmol/L (ref 3.5–5.1)
Sodium: 134 mmol/L — ABNORMAL LOW (ref 136–145)

## 2013-03-11 LAB — BASIC METABOLIC PANEL
Anion Gap: 7 (ref 7–16)
BUN: 9 mg/dL (ref 7–18)
Calcium, Total: 8 mg/dL — ABNORMAL LOW (ref 8.5–10.1)
Chloride: 92 mmol/L — ABNORMAL LOW (ref 98–107)
Creatinine: 0.71 mg/dL (ref 0.60–1.30)
EGFR (African American): >60
EGFR (Non-African Amer.): >60
Osmolality: 258 (ref 275–301)
Potassium: 3.6 mmol/L (ref 3.5–5.1)
Sodium: 128 mmol/L — ABNORMAL LOW (ref 136–145)

## 2013-03-11 LAB — CBC WITH DIFFERENTIAL/PLATELET
Basophil #: 0 10*3/uL (ref 0.0–0.1)
Basophil %: 0.1 %
Eosinophil #: 0 10*3/uL (ref 0.0–0.7)
Eosinophil %: 0 %
HCT: 35.1 % (ref 35.0–47.0)
HGB: 11.6 g/dL — ABNORMAL LOW (ref 12.0–16.0)
Lymphocyte #: 0.5 10*3/uL — ABNORMAL LOW (ref 1.0–3.6)
Lymphocyte %: 4.4 %
MCH: 28.3 pg (ref 26.0–34.0)
MCHC: 33.1 g/dL (ref 32.0–36.0)
Monocyte #: 0.3 x10 3/mm (ref 0.2–0.9)
Monocyte %: 2.4 %
Neutrophil #: 10.1 10*3/uL — ABNORMAL HIGH (ref 1.4–6.5)
Neutrophil %: 93.1 %
Platelet: 165 10*3/uL (ref 150–440)
RBC: 4.1 10*6/uL (ref 3.80–5.20)
WBC: 10.8 10*3/uL (ref 3.6–11.0)

## 2013-03-13 LAB — BASIC METABOLIC PANEL
Anion Gap: 4 — ABNORMAL LOW (ref 7–16)
BUN: 25 mg/dL — ABNORMAL HIGH (ref 7–18)
Chloride: 102 mmol/L (ref 98–107)
Co2: 31 mmol/L (ref 21–32)
EGFR (African American): >60
EGFR (Non-African Amer.): >60
Glucose: 130 mg/dL — ABNORMAL HIGH (ref 65–99)

## 2013-03-31 ENCOUNTER — Other Ambulatory Visit: Payer: Self-pay | Admitting: Internal Medicine

## 2013-04-01 ENCOUNTER — Ambulatory Visit: Payer: Medicare Other | Admitting: Internal Medicine

## 2013-04-06 DIAGNOSIS — I10 Essential (primary) hypertension: Secondary | ICD-10-CM

## 2013-04-06 DIAGNOSIS — J189 Pneumonia, unspecified organism: Secondary | ICD-10-CM

## 2013-04-06 DIAGNOSIS — E039 Hypothyroidism, unspecified: Secondary | ICD-10-CM

## 2013-04-06 DIAGNOSIS — R269 Unspecified abnormalities of gait and mobility: Secondary | ICD-10-CM

## 2013-04-09 ENCOUNTER — Emergency Department: Payer: Self-pay | Admitting: Emergency Medicine

## 2013-04-12 ENCOUNTER — Encounter (INDEPENDENT_AMBULATORY_CARE_PROVIDER_SITE_OTHER): Payer: Self-pay

## 2013-04-12 ENCOUNTER — Ambulatory Visit (INDEPENDENT_AMBULATORY_CARE_PROVIDER_SITE_OTHER): Payer: Medicare Other | Admitting: Internal Medicine

## 2013-04-12 ENCOUNTER — Encounter: Payer: Self-pay | Admitting: Internal Medicine

## 2013-04-12 VITALS — BP 132/78 | HR 86 | Temp 97.9°F | Resp 14 | Wt 228.8 lb

## 2013-04-12 DIAGNOSIS — R011 Cardiac murmur, unspecified: Secondary | ICD-10-CM

## 2013-04-12 DIAGNOSIS — J189 Pneumonia, unspecified organism: Secondary | ICD-10-CM

## 2013-04-12 DIAGNOSIS — E669 Obesity, unspecified: Secondary | ICD-10-CM

## 2013-04-12 NOTE — Patient Instructions (Signed)
I recommend joining a gym that has a Garment/textile technologist program to help you get your endurance and strength back.    I want you to lose 22 lbs ,  Or 10% of your body weight over the next year through diet and  Regular exercise.   Return ofr fasting labs on April 14,  The day before your appt.   This is  One version of a  "Low GI"  Diet:  It will still lower your blood sugars and allow you to lose 4 to 8  lbs  per month if you follow it carefully.  Your goal with exercise is a minimum of 30 minutes of aerobic exercise 5 days per week (Walking does not count once it becomes easy!)    All of the foods can be found at grocery stores and in bulk at [employer].  The Atkins protein bars and shakes are available in more varieties at [employer], [employer] and [place].     7 AM Breakfast:  Choose from the following:  Low carbohydrate Protein  Shakes (I recommend the EAS AdvantEdge "Carb Control" shakes  Or the low carb shakes by Atkins.    2.5 carbs   Arnold's "Sandwhich Thin"toasted  w/ peanut butter (no jelly: about 20 net carbs  "Bagel Thin" with cream cheese and salmon: about 20 carbs   a scrambled egg/bacon/cheese burrito made with Mission's "carb balance" whole wheat tortilla  (about 10 net carbs )   Avoid cereal and bananas, oatmeal and cream of wheat and grits. They are loaded with carbohydrates!   10 AM: high protein snack  Protein bar by Atkins (the snack size, under 200 cal, usually < 6 net carbs).    A stick of cheese:  Around 1 carb,  100 cal     Dannon Light n Fit [place] Yogurt  (80 cal, 8 carbs)  Other so called "protein bars" and Greek yogurts tend to be loaded with carbohydrates.  Remember, in food advertising, the word "energy" is synonymous for " carbohydrate."  Lunch:   A Sandwich using the bread choices listed, Can use any  Eggs,  lunchmeat, grilled meat or canned tuna), avocado, regular mayo/mustard  and cheese.  A Salad using blue cheese, ranch,  Goddess or vinagrette,  No  croutons or "confetti" and no "candied nuts" but regular nuts OK.   No pretzels or chips.  Pickles and miniature sweet peppers are a good low carb alternative that provide a "crunch"  The bread is the only source of carbohydrate in a sandwich and  can be decreased by trying some of these alternatives to traditional loaf bread  Joseph's makes a pita bread and a flat bread that are 50 cal and 4 net carbs available at [place] and [place].  This can be toasted to use with hummous as well  Toufayan makes a low carb flatbread that's 100 cal and 9 net carbs available at [employer] and [employer]'s makes 2 sizes of  Low carb whole wheat tortilla  (The large one is 210 cal and 6 net carbs) Avoid "Low fat dressings, as well as Barry Brunner and [place] dressings They are loaded with sugar!   3 PM/ Mid day  Snack:  Consider  1 ounce of  almonds, walnuts, pistachios, pecans, peanuts,  Macadamia nuts or a nut medley.  Avoid "granola"; the dried cranberries and raisins are loaded with carbohydrates. Mixed nuts as long as there are no raisins,  cranberries or dried  fruit.     6 PM  Dinner:     Meat/fowl/fish with a green salad, and either broccoli, cauliflower, green beans, spinach, brussel sprouts or  Lima beans. DO NOT BREAD THE PROTEIN!!      There is a low carb pasta by Dreamfield's that is acceptable and tastes great: only 5 digestible carbs/serving.( All grocery stores but BJs carry it )  Try Hurley Cisco Angelo's chicken piccata or chicken or eggplant parm over low carb pasta.(Lowes and BJs)   Marjory Lies Sanchez's "Carnitas" (pulled pork, no sauce,  0 carbs) or his beef pot roast to make a dinner burrito (at [employer]J's)  Pesto over low carb pasta (bj's sells a good quality pesto in the center refrigerated section of the deli   Whole wheat pasta is still full of digestible carbs and  Not as low in glycemic index as Dreamfield's.   Brown rice is still rice,  So skip the rice and noodles if you eat [place] or [place] (or at  least limit to 1/2 cup)  9 PM snack :   Breyer's "low carb" fudgsicle or  ice cream bar (Carb Smart line), or  Weight Watcher's ice cream bar , or another "no sugar added" ice cream;  a serving of fresh berries/cherries with whipped cream   Cheese or DANNON'S LlGHT N FIT GREEK YOGURT  Avoid bananas, pineapple, grapes  and watermelon on a regular basis because they are high in sugar.  THINK OF THEM AS DESSERT  Remember that snack Substitutions should be less than 10 NET carbs per serving and meals < 20 carbs. Remember to subtract fiber grams to get the "net carbs.",

## 2013-04-12 NOTE — Assessment & Plan Note (Addendum)
Lauren Bartlett has a Harsh, systolic murmur heard best at the left sternal border (AS or MR? ) Of unclear etiology. Lauren Bartlett is followed by Dr. Brita Romp call with his most recent note from April did not identify the source of the murmur. I'm not sure whether Lauren Bartlett has had an echocardiogram or not. I again requested records. Lauren Bartlett has a brother who has had open heart surgery within the past year. Her cardiomyopathy may be limiting her exercise endurance.

## 2013-04-12 NOTE — Assessment & Plan Note (Signed)
I have addressed  BMI and recommended a low glycemic index diet utilizing smaller more frequent meals to increase metabolism.  I have also recommended that patient start exercising with a goal of 30 minutes of aerobic exercise a minimum of 5 days per week. Screening for lipid disorders, thyroid and diabetes to be done today.   

## 2013-04-12 NOTE — Progress Notes (Signed)
Patient ID: Lauren Bartlett, female   DOB: [birth year], 58 y.o.   MRN: [id number]   Patient Active Problem List   Diagnosis Date Noted  . CAP (community acquired pneumonia) 04/13/2013  . Edema 01/12/2013  . Hypocalcemia 09/10/2012  . Hypothyroidism 05/04/2012  . Systolic murmur 73/41/9379  . Obesity (BMI 30-39.9) 08/04/2011  . Depression   . Hypertension   . Congenital absence of one kidney   . History of sciatica     Subjective:  CC:   Chief Complaint  Patient presents with  . Follow-up    pneumonia in december.    HPI:   Lauren Bartlett a 58 y.o. female who presents hospital follow up for recent admission for pneumonia.  Discharged home on Dec 15 from [facility] with home health for physical Therapy , which has now finished .  Uses a walker for long distance and cane for short distances.  "House Calls: nurse viisit has indentified several issues including  cognitive difficulties (clock drawing).    Has been having recurrent epistaxis ,  Had to be cauterized by [facility] last Wednesday.   Using petroleum jelly and sleeping with a humidifier, which she is cleaning regularly.  She is requesting assistance at home for help cleaning up the house.  She is capable of all ADLS currently, as long as the cooking doesn't require prolonged standing .  Drives independently.   Walking better but still feels weak and short of breath, not exercising.  Obesity addressed, diet reviewed,      Cognitive issues raised by "House calls" evaluation included clock drawing.  Continues to have difficulty placing hands at appropriate numbers for 11:10.  (abstract) However she can state the date, the president and can calculate:   Tuesday the 13th Jan Obama. [place] in 1.50  ? 90  Quarters in 1.50?  6    Past Medical History  Diagnosis Date  . Congenital absence of one kidney   . History of sciatica   . Depression     treated at [facility]  . Hypertension   . hypothyrodism     Past Surgical History   Procedure Laterality Date  . Combined hysterectomy abdominal w/ a&p repair / oophorectomy  1996    benign tumor  . Breast biopsy  2011    [facility]< benign  . Inner ear surgery      bilateral  . Abdominal hysterectomy         The following portions of the patient's history were reviewed and updated as appropriate: Allergies, current medications, and problem list.    Review of Systems:  Patient denies headache, fevers, malaise, unintentional weight loss, skin rash, eye pain, sinus congestion and sinus pain, sore throat, dysphagia,  hemoptysis , cough, dyspnea, wheezing, chest pain, palpitations, orthopnea, edema, abdominal pain, nausea, melena, diarrhea, constipation, flank pain, dysuria, hematuria, urinary  Frequency, nocturia, numbness, tingling, seizures,  Focal weakness, Loss of consciousness,  Tremor, insomnia, depression, anxiety, and suicidal ideation.      History   Social History  . Marital Status: Single    Spouse Name: N/A    Number of Children: N/A  . Years of Education: N/A   Occupational History  . cna     part time   Social History Main Topics  . Smoking status: Never Smoker   . Smokeless tobacco: Never Used  . Alcohol Use: No  . Drug Use: No  . Sexual Activity: Not Currently   Other Topics Concern  . Not on file  Social History Narrative   Lives in a group home for adults          Objective:  Filed Vitals:   04/12/13 1326  BP: 132/78  Pulse: 86  Temp: 97.9 F (36.6 C)  Resp: 14     General appearance: alert, cooperative and appears stated age Ears: normal TM's and external ear canals both ears Throat: lips, mucosa, and tongue normal; teeth and gums normal Neck: no adenopathy, no carotid bruit, supple, symmetrical, trachea midline and thyroid not enlarged, symmetric, no tenderness/mass/nodules Back: symmetric, no curvature. ROM normal. No CVA tenderness. Lungs: clear to auscultation bilaterally Heart: regular rate and rhythm, S1, S2  normal, no murmur, click, rub or gallop Abdomen: soft, non-tender; bowel sounds normal; no masses,  no organomegaly Pulses: 2+ and symmetric Skin: Skin color, texture, turgor normal. No rashes or lesions Lymph nodes: Cervical, supraclavicular, and axillary nodes normal.  Assessment and Plan:  Obesity (BMI 30-39.9) I have addressed  BMI and recommended a low glycemic index diet utilizing smaller more frequent meals to increase metabolism.  I have also recommended that patient start exercising with a goal of 30 minutes of aerobic exercise a minimum of 5 days per week. Screening for lipid disorders, thyroid and diabetes to be done today.    Systolic murmur She has a Harsh, systolic murmur heard best at the left sternal border (AS or MR? ) Of unclear etiology. She is followed by Dr. Brita Romp call with his most recent note from April did not identify the source of the murmur. I'm not sure whether she has had an echocardiogram or not. I again requested records. She has a brother who has had open heart surgery within the past year. Her cardiomyopathy may be limiting her exercise endurance.   CAP (community acquired pneumonia) And she was hospitalized from December 10 2 December 15 at [facility] for community acquired pneumonia accompanied by hypoxia. She was discharged home with home health physical therapy due to weakened condition. She has a normal lung exam today. Chest x-ray done during admission showed a right lower lobe infiltrate. This will need to be repeated in  6-8 weeks.  She is up to date on the pneumonia and influenza vaccine vaccines. Influenza test was negative.   A total of 40 minutes was spent with patient more than half of which was spent in counseling, reviewing records from other prviders and coordination of care. Updated Medication List Outpatient Encounter Prescriptions as of 04/12/2013  Medication Sig  . albuterol (PROVENTIL HFA;VENTOLIN HFA) 108 (90 BASE) MCG/ACT inhaler Inhale 2 puffs  into the lungs every 6 (six) hours as needed for wheezing or shortness of breath. WITH A SPACER  . cephALEXin (KEFLEX) 500 MG capsule Take 1 capsule by mouth daily.  . hydrochlorothiazide (HYDRODIURIL) 25 MG tablet TAKE 1 TABLET BY MOUTH DAILY .  [name] levothyroxine (SYNTHROID, LEVOTHROID) 112 MCG tablet TAKE 1 TABLET BY MOUTH DAILY  . losartan (COZAAR) 100 MG tablet TAKE 1 TABLET BY MOUTH DAILY .  . losartan (COZAAR) 100 MG tablet TAKE 1 TABLET BY MOUTH EVERY DAY  . [DISCONTINUED] hydrochlorothiazide (HYDRODIURIL) 25 MG tablet TAKE 1 TABLET BY MOUTH EVERY  . [DISCONTINUED] levofloxacin (LEVAQUIN) 500 MG tablet Take 1 tablet (500 mg total) by mouth daily.  . [DISCONTINUED] predniSONE (STERAPRED UNI-PAK) 10 MG tablet 6 tablets on Day 1 , then reduce by 1 tablet daily until gone     Orders Placed This Encounter  Procedures  . DG Chest 2 View  No Follow-up on file.

## 2013-04-13 ENCOUNTER — Encounter: Payer: Self-pay | Admitting: Internal Medicine

## 2013-04-13 DIAGNOSIS — J189 Pneumonia, unspecified organism: Secondary | ICD-10-CM | POA: Insufficient documentation

## 2013-04-13 NOTE — Assessment & Plan Note (Signed)
And she was hospitalized from December 10 2 December 15 at [facility] for community acquired pneumonia accompanied by hypoxia. She was discharged home with home health physical therapy due to weakened condition. She has a normal lung exam today. Chest x-ray done during admission showed a right lower lobe infiltrate. This will need to be repeated in  6-8 weeks.  She is up to date on the pneumonia and influenza vaccine vaccines. Influenza test was negative.

## 2013-05-12 ENCOUNTER — Telehealth: Payer: Self-pay | Admitting: Internal Medicine

## 2013-05-12 NOTE — Telephone Encounter (Signed)
FYI

## 2013-05-12 NOTE — Telephone Encounter (Signed)
Pt called to let you know she is having a sleep apnea test done march 3 @ [place]

## 2013-05-31 ENCOUNTER — Ambulatory Visit: Payer: Self-pay | Admitting: Specialist

## 2013-07-11 ENCOUNTER — Telehealth: Payer: Self-pay | Admitting: *Deleted

## 2013-07-11 DIAGNOSIS — E039 Hypothyroidism, unspecified: Secondary | ICD-10-CM

## 2013-07-11 DIAGNOSIS — E669 Obesity, unspecified: Secondary | ICD-10-CM

## 2013-07-11 DIAGNOSIS — I10 Essential (primary) hypertension: Secondary | ICD-10-CM

## 2013-07-11 NOTE — Telephone Encounter (Signed)
Pt is coming tomorrow what labs and dX?  

## 2013-07-12 ENCOUNTER — Other Ambulatory Visit (INDEPENDENT_AMBULATORY_CARE_PROVIDER_SITE_OTHER): Payer: Medicare Other

## 2013-07-12 DIAGNOSIS — E039 Hypothyroidism, unspecified: Secondary | ICD-10-CM

## 2013-07-12 DIAGNOSIS — I10 Essential (primary) hypertension: Secondary | ICD-10-CM

## 2013-07-12 DIAGNOSIS — E669 Obesity, unspecified: Secondary | ICD-10-CM

## 2013-07-12 LAB — LIPID PANEL
CHOL/HDL RATIO: 3
Cholesterol: 159 mg/dL (ref 0–200)
HDL: 46.3 mg/dL (ref >39.00)
LDL Cholesterol: 96 mg/dL (ref 0–99)
TRIGLYCERIDES: 85 mg/dL (ref 0.0–149.0)
VLDL: 17 mg/dL (ref 0.0–40.0)

## 2013-07-12 LAB — COMPREHENSIVE METABOLIC PANEL
ALBUMIN: 3.9 g/dL (ref 3.5–5.2)
ALK PHOS: 58 U/L (ref 39–117)
ALT: 12 U/L (ref 0–35)
AST: 19 U/L (ref 0–37)
BUN: 11 mg/dL (ref 6–23)
CO2: 27 mEq/L (ref 19–32)
Calcium: 8.2 mg/dL — ABNORMAL LOW (ref 8.4–10.5)
Chloride: 105 mEq/L (ref 96–112)
Creatinine, Ser: 0.7 mg/dL (ref 0.4–1.2)
GFR: 87.15 mL/min (ref >60.00)
Glucose, Bld: 76 mg/dL (ref 70–99)
POTASSIUM: 4.3 meq/L (ref 3.5–5.1)
SODIUM: 140 meq/L (ref 135–145)
Total Bilirubin: 0.6 mg/dL (ref 0.3–1.2)
Total Protein: 7.4 g/dL (ref 6.0–8.3)

## 2013-07-12 LAB — TSH: TSH: 1.53 u[IU]/mL (ref 0.35–5.50)

## 2013-07-13 ENCOUNTER — Ambulatory Visit: Payer: Medicare Other | Admitting: Internal Medicine

## 2013-07-14 ENCOUNTER — Ambulatory Visit (INDEPENDENT_AMBULATORY_CARE_PROVIDER_SITE_OTHER): Payer: Medicare Other | Admitting: Internal Medicine

## 2013-07-14 ENCOUNTER — Encounter: Payer: Self-pay | Admitting: Internal Medicine

## 2013-07-14 VITALS — BP 140/78 | HR 74 | Temp 97.7°F | Resp 16 | Wt 225.0 lb

## 2013-07-14 DIAGNOSIS — E669 Obesity, unspecified: Secondary | ICD-10-CM

## 2013-07-14 DIAGNOSIS — I10 Essential (primary) hypertension: Secondary | ICD-10-CM

## 2013-07-14 DIAGNOSIS — I079 Rheumatic tricuspid valve disease, unspecified: Secondary | ICD-10-CM

## 2013-07-14 DIAGNOSIS — I272 Pulmonary hypertension, unspecified: Secondary | ICD-10-CM | POA: Insufficient documentation

## 2013-07-14 DIAGNOSIS — E039 Hypothyroidism, unspecified: Secondary | ICD-10-CM

## 2013-07-14 DIAGNOSIS — I2789 Other specified pulmonary heart diseases: Secondary | ICD-10-CM

## 2013-07-14 DIAGNOSIS — R609 Edema, unspecified: Secondary | ICD-10-CM

## 2013-07-14 DIAGNOSIS — I071 Rheumatic tricuspid insufficiency: Secondary | ICD-10-CM

## 2013-07-14 MED ORDER — CALCIUM CARBONATE-VITAMIN D 500-200 MG-UNIT PO TABS: 1.0000 | ORAL_TABLET | Freq: Every day | ORAL | Status: DC

## 2013-07-14 NOTE — Patient Instructions (Addendum)
You are doing great!  Keep up the walking and dieting1  Your cholestero and thyroid function are fine   You should be taking  1200 mg of calcium and 1000 units of Vitamin D daily preferably through  Diet ,  But you can take one calcium supplement daily  I have sent a calcium supplement to your pharmacy    Almond/coconut milk is a great source and is available in a low calorie/unsweetened version at most grocery stores  Return in 3 months  (late June) for your annual physical

## 2013-07-14 NOTE — Assessment & Plan Note (Signed)
She has lost 3 lbs  Since last visit through increased exercise (walkis one mile daily most days) and diet.  Anorectics would be contraindicated due to suspected pulmonary hypertension , so we will continue with dietary modifications and exercise.

## 2013-07-14 NOTE — Progress Notes (Signed)
Patient ID: Lauren Bartlett, female   DOB: May 14, 1955, 58 y.o.   MRN: [id number]   Patient Active Problem List   Diagnosis Date Noted  . Pulmonary hypertension 07/14/2013  . Edema 01/12/2013  . Hypocalcemia 09/10/2012  . Hypothyroidism 05/04/2012  . Severe tricuspid regurgitation by prior echocardiogram 01/11/2012  . Obesity (BMI 30-39.9) 08/04/2011  . Depression   . Hypertension   . Congenital absence of one kidney   . History of sciatica     Subjective:  CC:   Chief Complaint  Patient presents with  . Follow-up    6 month    HPI:   Lauren Bartlett is a 58 y.o. female who presents for 3 month follow up on chronic conditions including obesity, deconditioning and sciatica.   Raul Del ordered a sleep study which was positive but CPAP titration study is required .  He thinks she has pulm htn and has been prescribed Anoro once daily for the past month  .  Has  Noticed an improvement in her walking distance ,  And is walking a mile daily .    Past Medical History  Diagnosis Date  . Congenital absence of one kidney   . History of sciatica   . Depression     treated at [employer]  . Hypertension   . hypothyrodism     Past Surgical History  Procedure Laterality Date  . Combined hysterectomy abdominal w/ a&p repair / oophorectomy  1996    benign tumor  . Breast biopsy  2011    [facility]< benign  . Inner ear surgery      bilateral  . Abdominal hysterectomy         The following portions of the patient's history were reviewed and updated as appropriate: Allergies, current medications, and problem list.    Review of Systems:   Patient denies headache, fevers, malaise, unintentional weight loss, skin rash, eye pain, sinus congestion and sinus pain, sore throat, dysphagia,  hemoptysis , cough, dyspnea, wheezing, chest pain, palpitations, orthopnea, edema, abdominal pain, nausea, melena, diarrhea, constipation, flank pain, dysuria, hematuria, urinary  Frequency, nocturia, numbness,  tingling, seizures,  Focal weakness, Loss of consciousness,  Tremor, insomnia, depression, anxiety, and suicidal ideation.     History   Social History  . Marital Status: Single    Spouse Name: N/A    Number of Children: N/A  . Years of Education: N/A   Occupational History  . cna     part time   Social History Main Topics  . Smoking status: Never Smoker   . Smokeless tobacco: Never Used  . Alcohol Use: No  . Drug Use: No  . Sexual Activity: Not Currently   Other Topics Concern  . Not on file   Social History Narrative   Lives in a group home for adults          Objective:  Filed Vitals:   07/14/13 0942  BP: 140/78  Pulse: 74  Temp: 97.7 F (36.5 C)  Resp: 16     General appearance: alert, cooperative and appears stated age Ears: normal TM's and external ear canals both ears Throat: lips, mucosa, and tongue normal; teeth and gums normal Neck: no adenopathy, no carotid bruit, supple, symmetrical, trachea midline and thyroid not enlarged, symmetric, no tenderness/mass/nodules Back: symmetric, no curvature. ROM normal. No CVA tenderness. Lungs: clear to auscultation bilaterally Heart: regular rate and rhythm, S1, S2 normal, no murmur, click, rub or gallop Abdomen: soft, non-tender; bowel sounds normal; no masses,  no organomegaly Pulses: 2+ and symmetric Skin: Skin color, texture, turgor normal. No rashes or lesions Lymph nodes: Cervical, supraclavicular, and axillary nodes normal.  Assessment and Plan:  Severe tricuspid regurgitation by prior echocardiogram By [facility] 05/2013 [facility].  With elevated RV  Systolic pressure to 91 mmg Hg   Pulmonary hypertension Started on Anoro daily by  [employer] .  Advised to continue Albuterol.  Right heart cath and repeat CPAP titration studies recommended  To confirm and determine causation.  Dr Raul Del ordered the original sleep study and Dr Clayborn Bigness is her cardiologist  Obesity (BMI 30-39.9) She has lost 3 lbs  Since last  visit through increased exercise (walkis one mile daily most days) and diet.  Anorectics would be contraindicated due to suspected pulmonary hypertension , so we will continue with dietary modifications and exercise.   Hypothyroidism Thyroid function is WNL on current dose.  No current changes needed.   Lab Results  Component Value Date   TSH 1.53 07/12/2013     Hypertension Well controlled on current regimen. Renal function stable, no changes today.  Lab Results  Component Value Date   CREATININE 0.7 07/12/2013   Lab Results  Component Value Date   NA 140 07/12/2013   K 4.3 07/12/2013   CL 105 07/12/2013   CO2 27 07/12/2013      Hypocalcemia Mild, asymptomatic,  Reviewed calcium supplementation with patient today  Edema Intermittent, Secondary to pulmonary hypertension .  Will consider addition of furosemide   A total of 30 minutes was spent with patient more than half of which was spent in counseling, reviewing records from other prviders and coordination of care.  Updated Medication List Outpatient Encounter Prescriptions as of 07/14/2013  Medication Sig  . hydrochlorothiazide (HYDRODIURIL) 25 MG tablet TAKE 1 TABLET BY MOUTH DAILY .  [name] levothyroxine (SYNTHROID, LEVOTHROID) 112 MCG tablet TAKE 1 TABLET BY MOUTH DAILY  . losartan (COZAAR) 100 MG tablet TAKE 1 TABLET BY MOUTH DAILY .  . Umeclidinium-Vilanterol (ANORO ELLIPTA) 62.5-25 MCG/INH AEPB Inhale 1 puff into the lungs daily.  [name] albuterol (PROVENTIL HFA;VENTOLIN HFA) 108 (90 BASE) MCG/ACT inhaler Inhale 2 puffs into the lungs every 6 (six) hours as needed for wheezing or shortness of breath. WITH A SPACER  . calcium-vitamin D (OSCAL WITH D) 500-200 MG-UNIT per tablet Take 1 tablet by mouth daily with breakfast.  . [DISCONTINUED] cephALEXin (KEFLEX) 500 MG capsule Take 1 capsule by mouth daily.  . [DISCONTINUED] losartan (COZAAR) 100 MG tablet TAKE 1 TABLET BY MOUTH EVERY DAY     No orders of the defined types were  placed in this encounter.    No Follow-up on file.

## 2013-07-14 NOTE — Assessment & Plan Note (Addendum)
Started on [employer] daily by  [employer] .  Advised to continue Albuterol.  Right heart cath and repeat CPAP titration studies recommended  To confirm and determine causation.  Dr Raul Del ordered the original sleep study and Dr Clayborn Bigness is her cardiologist

## 2013-07-14 NOTE — Assessment & Plan Note (Signed)
By [facility] 05/2013 [facility].  With elevated RV  Systolic pressure to 91 mmg Hg

## 2013-07-14 NOTE — Progress Notes (Signed)
Pre-visit discussion using our clinic review tool. No additional management support is needed unless otherwise documented below in the visit note.  

## 2013-07-16 ENCOUNTER — Encounter: Payer: Self-pay | Admitting: Internal Medicine

## 2013-07-16 NOTE — Assessment & Plan Note (Signed)
Well controlled on current regimen. Renal function stable, no changes today.  Lab Results  Component Value Date   CREATININE 0.7 07/12/2013   Lab Results  Component Value Date   NA 140 07/12/2013   K 4.3 07/12/2013   CL 105 07/12/2013   CO2 27 07/12/2013

## 2013-07-16 NOTE — Assessment & Plan Note (Signed)
Mild, asymptomatic,  Reviewed calcium supplementation with patient today

## 2013-07-16 NOTE — Assessment & Plan Note (Signed)
Thyroid function is WNL on current dose.  No current changes needed.   Lab Results  Component Value Date   TSH 1.53 07/12/2013

## 2013-07-16 NOTE — Assessment & Plan Note (Addendum)
Intermittent, Secondary to pulmonary hypertension .  Will consider addition of furosemide

## 2013-09-22 ENCOUNTER — Encounter: Payer: Self-pay | Admitting: Internal Medicine

## 2013-09-22 ENCOUNTER — Ambulatory Visit (INDEPENDENT_AMBULATORY_CARE_PROVIDER_SITE_OTHER): Payer: Medicare Other | Admitting: Internal Medicine

## 2013-09-22 VITALS — BP 148/80 | HR 86 | Temp 97.9°F | Resp 18 | Ht 62.5 in | Wt 217.0 lb

## 2013-09-22 DIAGNOSIS — Z0000 Encounter for general adult medical examination without abnormal findings: Secondary | ICD-10-CM

## 2013-09-22 DIAGNOSIS — I272 Pulmonary hypertension, unspecified: Secondary | ICD-10-CM

## 2013-09-22 DIAGNOSIS — E669 Obesity, unspecified: Secondary | ICD-10-CM

## 2013-09-22 DIAGNOSIS — I509 Heart failure, unspecified: Secondary | ICD-10-CM

## 2013-09-22 DIAGNOSIS — Z1239 Encounter for other screening for malignant neoplasm of breast: Secondary | ICD-10-CM

## 2013-09-22 DIAGNOSIS — I2789 Other specified pulmonary heart diseases: Secondary | ICD-10-CM

## 2013-09-22 DIAGNOSIS — R0600 Dyspnea, unspecified: Secondary | ICD-10-CM

## 2013-09-22 DIAGNOSIS — I079 Rheumatic tricuspid valve disease, unspecified: Secondary | ICD-10-CM

## 2013-09-22 DIAGNOSIS — I38 Endocarditis, valve unspecified: Secondary | ICD-10-CM

## 2013-09-22 DIAGNOSIS — Z1211 Encounter for screening for malignant neoplasm of colon: Secondary | ICD-10-CM

## 2013-09-22 DIAGNOSIS — R0989 Other specified symptoms and signs involving the circulatory and respiratory systems: Secondary | ICD-10-CM

## 2013-09-22 DIAGNOSIS — Z79899 Other long term (current) drug therapy: Secondary | ICD-10-CM

## 2013-09-22 DIAGNOSIS — I071 Rheumatic tricuspid insufficiency: Secondary | ICD-10-CM

## 2013-09-22 DIAGNOSIS — R0609 Other forms of dyspnea: Secondary | ICD-10-CM

## 2013-09-22 DIAGNOSIS — I5032 Chronic diastolic (congestive) heart failure: Secondary | ICD-10-CM

## 2013-09-22 DIAGNOSIS — Z90710 Acquired absence of both cervix and uterus: Secondary | ICD-10-CM

## 2013-09-22 DIAGNOSIS — Z90721 Acquired absence of ovaries, unilateral: Principal | ICD-10-CM

## 2013-09-22 LAB — CBC WITH DIFFERENTIAL/PLATELET
Basophils Absolute: 0 10*3/uL (ref 0.0–0.1)
Basophils Relative: 0.4 % (ref 0.0–3.0)
EOS PCT: 4.3 % (ref 0.0–5.0)
Eosinophils Absolute: 0.3 10*3/uL (ref 0.0–0.7)
HEMATOCRIT: 37.5 % (ref 36.0–46.0)
Hemoglobin: 12.1 g/dL (ref 12.0–15.0)
LYMPHS ABS: 1.4 10*3/uL (ref 0.7–4.0)
Lymphocytes Relative: 21.7 % (ref 12.0–46.0)
MCHC: 32.4 g/dL (ref 30.0–36.0)
MCV: 84.6 fl (ref 78.0–100.0)
MONO ABS: 0.6 10*3/uL (ref 0.1–1.0)
MONOS PCT: 9.7 % (ref 3.0–12.0)
Neutro Abs: 4.1 10*3/uL (ref 1.4–7.7)
Neutrophils Relative %: 63.9 % (ref 43.0–77.0)
Platelets: 180 10*3/uL (ref 150.0–400.0)
RBC: 4.43 Mil/uL (ref 3.87–5.11)
RDW: 17.4 % — ABNORMAL HIGH (ref 11.5–15.5)
WBC: 6.4 10*3/uL (ref 4.0–10.5)

## 2013-09-22 LAB — COMPREHENSIVE METABOLIC PANEL
ALBUMIN: 4.1 g/dL (ref 3.5–5.2)
ALK PHOS: 61 U/L (ref 39–117)
ALT: 13 U/L (ref 0–35)
AST: 24 U/L (ref 0–37)
BILIRUBIN TOTAL: 0.4 mg/dL (ref 0.2–1.2)
BUN: 17 mg/dL (ref 6–23)
CO2: 31 mEq/L (ref 19–32)
CREATININE: 0.9 mg/dL (ref 0.4–1.2)
Calcium: 8.7 mg/dL (ref 8.4–10.5)
Chloride: 102 mEq/L (ref 96–112)
GFR: 72.08 mL/min (ref >60.00)
Glucose, Bld: 84 mg/dL (ref 70–99)
POTASSIUM: 4.4 meq/L (ref 3.5–5.1)
Sodium: 138 mEq/L (ref 135–145)
Total Protein: 8 g/dL (ref 6.0–8.3)

## 2013-09-22 LAB — BRAIN NATRIURETIC PEPTIDE: Pro B Natriuretic peptide (BNP): 67 pg/mL (ref 0.0–100.0)

## 2013-09-22 NOTE — Progress Notes (Signed)
Pre-visit discussion using our clinic review tool. No additional management support is needed unless otherwise documented below in the visit note.  

## 2013-09-22 NOTE — Progress Notes (Signed)
Patient ID: Lauren Bartlett, female   DOB: 12/30/1955, 58 y.o.   MRN: [id number]  .  Subjective:   The patient is here for annual  wellness examination and management of other chronic and acute problems.. Patient noted to be hypoxic initially with 02 sat of 88% which occurred after waling in from outside.   ambulatory sats 91% in clinic with HR 114,  Has pulmonary hypertension and severe tricuspid regurgitation.,. Has been wearing 02 at night for the past 2 months,  For nocturnal hypoxia noted with overnight pulse oximetry test.  The test was not covered by Medicaid and she is having to pay it out of pocket.  She is wearing 2.5 L of 02 at night  Only.  No nosebleeds since then .  The 02 is supplied by [employer] and states that the humidifier is not working correctly and spraying water into her nose.  ,  [place] [facility] pulm and cardiology managing her lung and cardiac issues.  [place] in July.    Pulmonary hypertension and moderate to severe tricuspid regurgitation.  [place]  Feels safe. Has trouble transporting groceries from car to house,  Cleaning home especially cathrooms.  Preparing meals, changing sheets on the bed,  ang showering despite using a shower chair. Has no assistance currently at home.  Does drive,  Uses a cane ,  Has  Sciatica  As well and cannot lie flat in bed due to dyspnea and back pain   Has been intentionally losing weight.  45 lb thus far.  Diet is mostly fruits and vegeetables,  Chicken and tuna.    Walking indoors daily for 30 minutes daily  In the hallways of the apt complex.     The risk factors are reflected in the social history.  The roster of all physicians providing medical care to patient - is listed in the Snapshot section of the chart.  Activities of daily living:  The patient is 100% independent in all ADLs: dressing, toileting, feeding as well as independent mobility  Home safety : The patient has  smoke detectors in the home. They wear seatbelts.  There are no firearms at home. There is no violence in the home.   There is no risks for hepatitis, STDs or HIV. There is no   history of blood transfusion. They have no travel history to infectious disease endemic areas of the world.  The patient has seen their dentist in the last six month. They have seen their eye doctor in the last year. They admit to slight hearing difficulty with regard to whispered voices and some television programs.  They have deferred audiologic testing in the last year.  They do not  have excessive sun exposure. Discussed the need for sun protection: hats, long sleeves and use of sunscreen if there is significant sun exposure.   Diet: the importance of a healthy diet is discussed. They do have a healthy diet.  The benefits of regular aerobic exercise were discussed. She walks 4 times per week ,  20 minutes.   Depression screen: there are no signs or vegative symptoms of depression- irritability, change in appetite, anhedonia, sadness/tearfullness.  Cognitive assessment: the patient manages all their financial and personal affairs and is actively engaged. They could relate day,date,year and events; recalled 2/3 objects at 3 minutes; performed clock-face test normally.  The following portions of the patient's history were reviewed and updated as appropriate: allergies, current  medications, past family history, past medical history,  past surgical history, past social history  and problem list.  Visual acuity was not assessed per patient preference since she has regular follow up with her ophthalmologist. Hearing and body mass index were assessed and reviewed.   During the course of the visit the patient was educated and counseled about appropriate screening and preventive services including : fall prevention , diabetes screening, nutrition counseling, colorectal cancer screening, and recommended immunizations.   Review of  Systems A comprehensive review of systems was negative.   Objective:   BP 148/80  Pulse 86  Temp(Src) 97.9 F (36.6 C) (Oral)  Resp 18  Ht 5' 2.5" (1.588 m)  Wt 217 lb (98.431 kg)  BMI 39.03 kg/m2  SpO2 93%  General appearance: alert, cooperative and appears stated age Ears: normal TM's and external ear canals both ears Throat: lips, mucosa, and tongue normal; teeth and gums normal Neck: no adenopathy, no carotid bruit, supple, symmetrical, trachea midline and thyroid not enlarged, symmetric, no tenderness/mass/nodules Back: symmetric, no curvature. ROM normal. No CVA tenderness. Lungs: clear to auscultation bilaterally Heart: regular rate and rhythm, S1, S2 normal, no murmur, click, rub or gallop Abdomen: soft, non-tender; bowel sounds normal; no masses,  no organomegaly Pulses: 2+ and symmetric Skin: Skin color, texture, turgor normal. No rashes or lesions Lymph nodes: Cervical, supraclavicular, and axillary nodes normal.   Assessment and plan:   Severe tricuspid regurgitation by prior echocardiogram By [facility] 05/2013 [facility].  With elevated RV  Systolic pressure to 91 mmg Hg She follows up with Cardiology and Pulmonology in the next 2-3 weeks.      Pulmonary hypertension Etiology may be OSA.  She is still able to walk iin cooler weather but has difficulty with housework due to exertional dyspnea.   Obesity (BMI 30-39.9) I have congratulated her in reduction of   BMI and encouraged  Continued weight loss with goal of 10% of body weigh over the next 6 months using a low glycemic index diet and regular exercise a minimum of 5 days per week.    Encounter for preventive health examination Annual comprehensive exam was done including breast, pelvic and PAP smear. All screenings have been addressed .    Updated Medication List Outpatient Encounter Prescriptions as of 09/22/2013  Medication Sig  . albuterol (PROVENTIL HFA;VENTOLIN HFA) 108 (90 BASE) MCG/ACT inhaler Inhale 2  puffs into the lungs every 6 (six) hours as needed for wheezing or shortness of breath. WITH A SPACER  . calcium-vitamin D (OSCAL WITH D) 500-200 MG-UNIT per tablet Take 1 tablet by mouth daily with breakfast.  . hydrochlorothiazide (HYDRODIURIL) 25 MG tablet TAKE 1 TABLET BY MOUTH DAILY .  [name] levothyroxine (SYNTHROID, LEVOTHROID) 112 MCG tablet TAKE 1 TABLET BY MOUTH DAILY  . losartan (COZAAR) 100 MG tablet TAKE 1 TABLET BY MOUTH DAILY .  . Umeclidinium-Vilanterol (ANORO ELLIPTA) 62.5-25 MCG/INH AEPB Inhale 1 puff into the lungs daily.

## 2013-09-22 NOTE — Patient Instructions (Signed)
You had your annual Medicare wellness exam today  We will schedule your mammogram soon.  Please use the stool kit to send [place] back a sample to test for blood.  This is your colon CA screening test.   We will contact you with the bloodwork results

## 2013-09-24 ENCOUNTER — Encounter: Payer: Self-pay | Admitting: Internal Medicine

## 2013-09-24 DIAGNOSIS — Z0000 Encounter for general adult medical examination without abnormal findings: Secondary | ICD-10-CM | POA: Insufficient documentation

## 2013-09-24 NOTE — Assessment & Plan Note (Signed)
Etiology may be OSA.  She is still able to walk iin cooler weather but has difficulty with housework due to exertional dyspnea.

## 2013-09-24 NOTE — Assessment & Plan Note (Signed)
By [facility] 05/2013 [facility].  With elevated RV  Systolic pressure to 91 mmg Hg She follows up with Cardiology and Pulmonology in the next 2-3 weeks.

## 2013-09-24 NOTE — Assessment & Plan Note (Signed)
Annual comprehensive exam was done including breast, pelvic and PAP smear. All screenings have been addressed .  

## 2013-09-24 NOTE — Assessment & Plan Note (Signed)
I have congratulated her in reduction of   BMI and encouraged  Continued weight loss with goal of 10% of body weigh over the next 6 months using a low glycemic index diet and regular exercise a minimum of 5 days per week.    

## 2013-09-26 ENCOUNTER — Ambulatory Visit (INDEPENDENT_AMBULATORY_CARE_PROVIDER_SITE_OTHER): Payer: Medicare Other | Admitting: *Deleted

## 2013-09-26 ENCOUNTER — Encounter: Payer: Self-pay | Admitting: *Deleted

## 2013-09-26 DIAGNOSIS — Z1211 Encounter for screening for malignant neoplasm of colon: Secondary | ICD-10-CM

## 2013-09-26 LAB — FECAL OCCULT BLOOD, GUAIAC: FECAL OCCULT BLD: NEGATIVE

## 2013-09-26 LAB — FECAL OCCULT BLOOD, IMMUNOCHEMICAL: Fecal Occult Bld: NEGATIVE

## 2013-09-27 ENCOUNTER — Encounter: Payer: Self-pay | Admitting: *Deleted

## 2013-09-27 ENCOUNTER — Other Ambulatory Visit: Payer: Self-pay | Admitting: Internal Medicine

## 2013-10-17 ENCOUNTER — Other Ambulatory Visit: Payer: Self-pay | Admitting: *Deleted

## 2013-10-17 DIAGNOSIS — N63 Unspecified lump in unspecified breast: Secondary | ICD-10-CM

## 2013-10-18 ENCOUNTER — Other Ambulatory Visit: Payer: Self-pay | Admitting: *Deleted

## 2013-10-18 DIAGNOSIS — N63 Unspecified lump in unspecified breast: Secondary | ICD-10-CM

## 2013-10-20 ENCOUNTER — Ambulatory Visit: Payer: Self-pay | Admitting: Internal Medicine

## 2013-10-20 ENCOUNTER — Encounter: Payer: Self-pay | Admitting: *Deleted

## 2013-10-20 LAB — HM MAMMOGRAPHY: HM MAMMO: NEGATIVE

## 2014-02-15 ENCOUNTER — Telehealth: Payer: Self-pay

## 2014-02-15 NOTE — Telephone Encounter (Signed)
LVM for pt to call back  RE: AWV for 2015 needs to be scheduled

## 2014-03-13 ENCOUNTER — Encounter: Payer: Self-pay | Admitting: Nurse Practitioner

## 2014-03-13 ENCOUNTER — Ambulatory Visit (INDEPENDENT_AMBULATORY_CARE_PROVIDER_SITE_OTHER): Payer: Medicare Other | Admitting: Nurse Practitioner

## 2014-03-13 VITALS — BP 140/83 | HR 77 | Temp 98.0°F | Resp 14 | Ht 62.5 in | Wt 211.2 lb

## 2014-03-13 DIAGNOSIS — Z23 Encounter for immunization: Secondary | ICD-10-CM

## 2014-03-13 MED ORDER — LOSARTAN POTASSIUM 100 MG PO TABS: 100.0000 mg | ORAL_TABLET | Freq: Every day | ORAL | Status: DC

## 2014-03-13 MED ORDER — HYDROCHLOROTHIAZIDE 25 MG PO TABS: 25.0000 mg | ORAL_TABLET | Freq: Every day | ORAL | Status: DC

## 2014-03-13 NOTE — Patient Instructions (Signed)
You had your Annual Wellness Visit for Medicare.   Congratulations on your weight loss! Keep up the good work!   Happy Holidays!

## 2014-03-13 NOTE — Progress Notes (Signed)
   Subjective:    Patient ID: Lauren Bartlett, female    DOB: Jul 09, 1955, 58 y.o.   MRN: [id number]  HPI Health Screenings  Mammogram- 10/18/13, results negative   Pap Smear- Total hysterectomy.  Colonoscopy- IFOBT negative Bone Density- Does not meet age or other criteria for BDS Glaucoma- UTD, Dr. Atilano Median in [place] - Dr. Tami Ribas- ENT Hemoglobin A1C - No A1c, Multiple CMET's with normal blood glucose Cholesterol: 07/12/13- normal   Social  Alcohol intake- Denies Smoking history- Never Smoker Smokers in home- None Illicit drug use- Denies Exercise- Walk 7 days a week 30 minutes Diet- Small meals, pre-packaged meals  Sexually Active- No Multiple Partners- No  Safety  Patient feesl safe at home. - Yes Patient does have smoke detectors at home- Yes Patient does wear sunscreen or protective clothing when in direct sunlight- Yes Patient does wear seat belt when driving or riding with others. - Yes  Activities of Daily Living Patient can do their own household chores. Denies needing assistance with: driving, feeding themselves, getting from bed to chair, getting to the toilet, bathing/showering, dressing, managing money, climbing flight of stairs, or preparing meals. Patient has cane and walker to assist with ambulation.   Depression Screen Patient denies losing interest in daily life, feeling hopeless, or crying easily over simple problems.- Denies  Fall Screen Patient denies being afraid of falling or falling in the last year.   Memory Screen Patient denies problems with memory, misplacing items, and is able to balance checkbook/bank accounts. Patient is alert, normal appearance, oriented to person/place/and time. Correctly identified the president of the [place], recall of 3 objects, and performing simple calculations.  Patient displays appropriate judgement and can read correct time from watch face.   Immunizations The following Immunizations are up to date: Influenza,  pneumonia, and tetanus.   Other Provides Dr. Derrel Nip- PCP Dr. Tami Ribas - ENT Dr. Clayborn Bigness- Cardiologist Dr. Silvestre Gunner- Pulmonary Dr. Phillis Knack- Dentist   Review of Systems No ROS due to Medicare Annual Wellness Visit    Objective:   Physical Exam  This is a routine wellness examination for this patient.  I reviewed all health maintenance protocols including mammography, colonoscopy, bone density.  Needed referrals placed. Age and diagnosis appropriate screening labs were ordered.  Her immunization history was reviewed and appropriate vaccinations were ordered.  Her current medications and allergies were reviewed and needed refills of her chronic medications were ordered if needed.  The plan for yearly health maintenance was discussed.     MEDICARE ATTESTATION I have personally reviewed:  The patient's medical and social history. The use of alcohol, tobacco and illicit drugs. The current medications and supplements. The patient's function ability including ADLs, fall risks, home safety risks, cognitive, and hearing and visual impairment.   Diet and physical activities. Evaluation for depression and mood disorders.    The patient's weight, height, BMI and visual acuity have been recorded in the chart.  I have counseled and provided education to the patient based on review of the above.   BP 140/83 mmHg  Pulse 77  Temp(Src) 98 F (36.7 C) (Oral)  Resp 14  Ht 5' 2.5" (1.588 m)  Wt 211 lb 4 oz (95.822 kg)  BMI 38.00 kg/m2  SpO2 95%     Assessment & Plan:

## 2014-03-17 ENCOUNTER — Ambulatory Visit: Payer: Medicare Other | Admitting: Nurse Practitioner

## 2014-04-05 ENCOUNTER — Ambulatory Visit: Payer: Medicare Other | Admitting: Internal Medicine

## 2014-04-21 ENCOUNTER — Ambulatory Visit: Payer: Medicare Other | Admitting: Internal Medicine

## 2014-05-02 ENCOUNTER — Telehealth: Payer: Self-pay | Admitting: Internal Medicine

## 2014-05-02 NOTE — Telephone Encounter (Signed)
Notified patient, patient ask if we could please mail form back to her?

## 2014-05-02 NOTE — Telephone Encounter (Signed)
Patient has no medical condition that I can use to appeal her jury duty summons, so I will not fill out the form she dropped off.  Lots of people have back pain.

## 2014-05-19 ENCOUNTER — Ambulatory Visit: Payer: Medicaid Other | Admitting: Internal Medicine

## 2014-06-15 ENCOUNTER — Ambulatory Visit: Payer: Medicaid Other | Admitting: Internal Medicine

## 2014-06-30 ENCOUNTER — Ambulatory Visit: Payer: Medicaid Other | Admitting: Internal Medicine

## 2014-07-07 ENCOUNTER — Ambulatory Visit (INDEPENDENT_AMBULATORY_CARE_PROVIDER_SITE_OTHER): Payer: Medicare Other | Admitting: Internal Medicine

## 2014-07-07 ENCOUNTER — Encounter: Payer: Self-pay | Admitting: Internal Medicine

## 2014-07-07 VITALS — BP 130/70 | HR 83 | Temp 97.9°F | Resp 14 | Ht 62.5 in | Wt 208.1 lb

## 2014-07-07 DIAGNOSIS — Z1239 Encounter for other screening for malignant neoplasm of breast: Secondary | ICD-10-CM

## 2014-07-07 DIAGNOSIS — E038 Other specified hypothyroidism: Secondary | ICD-10-CM | POA: Diagnosis not present

## 2014-07-07 DIAGNOSIS — J301 Allergic rhinitis due to pollen: Secondary | ICD-10-CM

## 2014-07-07 DIAGNOSIS — E559 Vitamin D deficiency, unspecified: Secondary | ICD-10-CM

## 2014-07-07 DIAGNOSIS — R5383 Other fatigue: Secondary | ICD-10-CM | POA: Diagnosis not present

## 2014-07-07 DIAGNOSIS — E034 Atrophy of thyroid (acquired): Secondary | ICD-10-CM

## 2014-07-07 DIAGNOSIS — E669 Obesity, unspecified: Secondary | ICD-10-CM

## 2014-07-07 LAB — COMPREHENSIVE METABOLIC PANEL
ALBUMIN: 4 g/dL (ref 3.5–5.2)
ALT: 12 U/L (ref 0–35)
AST: 18 U/L (ref 0–37)
Alkaline Phosphatase: 61 U/L (ref 39–117)
BUN: 16 mg/dL (ref 6–23)
CHLORIDE: 102 meq/L (ref 96–112)
CO2: 30 mEq/L (ref 19–32)
CREATININE: 0.79 mg/dL (ref 0.40–1.20)
Calcium: 8.8 mg/dL (ref 8.4–10.5)
GFR: 79.28 mL/min (ref >60.00)
Glucose, Bld: 84 mg/dL (ref 70–99)
POTASSIUM: 4.4 meq/L (ref 3.5–5.1)
Sodium: 138 mEq/L (ref 135–145)
Total Bilirubin: 0.5 mg/dL (ref 0.2–1.2)
Total Protein: 7.5 g/dL (ref 6.0–8.3)

## 2014-07-07 LAB — CBC WITH DIFFERENTIAL/PLATELET
Basophils Absolute: 0 10*3/uL (ref 0.0–0.1)
Basophils Relative: 0.5 % (ref 0.0–3.0)
EOS ABS: 0.3 10*3/uL (ref 0.0–0.7)
EOS PCT: 4.8 % (ref 0.0–5.0)
HEMATOCRIT: 39.7 % (ref 36.0–46.0)
Hemoglobin: 13.1 g/dL (ref 12.0–15.0)
LYMPHS ABS: 1.4 10*3/uL (ref 0.7–4.0)
Lymphocytes Relative: 23 % (ref 12.0–46.0)
MCHC: 33 g/dL (ref 30.0–36.0)
MCV: 86.5 fl (ref 78.0–100.0)
MONO ABS: 0.5 10*3/uL (ref 0.1–1.0)
Monocytes Relative: 7.4 % (ref 3.0–12.0)
Neutro Abs: 3.9 10*3/uL (ref 1.4–7.7)
Neutrophils Relative %: 64.3 % (ref 43.0–77.0)
Platelets: 171 10*3/uL (ref 150.0–400.0)
RBC: 4.59 Mil/uL (ref 3.87–5.11)
RDW: 14.6 % (ref 11.5–15.5)
WBC: 6.1 10*3/uL (ref 4.0–10.5)

## 2014-07-07 LAB — VITAMIN D 25 HYDROXY (VIT D DEFICIENCY, FRACTURES): VITD: 18.99 ng/mL — ABNORMAL LOW (ref 30.00–100.00)

## 2014-07-07 LAB — TSH: TSH: 0.92 u[IU]/mL (ref 0.35–4.50)

## 2014-07-07 MED ORDER — LEVOCETIRIZINE DIHYDROCHLORIDE 5 MG PO TABS: 5.0000 mg | ORAL_TABLET | Freq: Every evening | ORAL | Status: DC

## 2014-07-07 NOTE — Patient Instructions (Signed)
I have prescribed an antihistamine to take daily for you allergies during the spring and summer  You next mammogram is due in July.     Congratulations on losing weight!!  Keep up the good work

## 2014-07-07 NOTE — Progress Notes (Signed)
Patient ID: Lauren Bartlett, female   DOB: Feb 05, 1956, 59 y.o.   MRN: [id number]   Patient Active Problem List   Diagnosis Date Noted  . Allergic rhinitis 07/09/2014  . Encounter for preventive health examination 09/24/2013  . S/P hysterectomy with oophorectomy 09/22/2013  . Pulmonary hypertension 07/14/2013  . Edema 01/12/2013  . Hypocalcemia 09/10/2012  . Hypothyroidism 05/04/2012  . Severe tricuspid regurgitation by prior echocardiogram 01/11/2012  . Obesity (BMI 30-39.9) 08/04/2011  . Depression   . Hypertension   . Congenital absence of one kidney   . History of sciatica     Subjective:  CC:   Chief Complaint  Patient presents with  . Follow-up    Last appt was 03/13/14.    HPI:   Lauren Bartlett is a 59 y.o. female who presents for 6 month follow up on obesity,  Hypothyroidism, and other chronic issues.  Her  Medicare wellness exam was done by [facility] in Dec .  Patient is s/p hysterectomy , normal mammogram was done in July 2015  6 month follow up on obesity, hypertension and hypothyroidisim.  Still losing weight,  9 lbs total thus far  Since June  Cutting back on por for an hour dailytion sizes, and walking twice daily.  Sometimes gets short of breath).   sleeping better  Since walking,  Eats an early supper  .  avoiding fried foods.  Cereal for breakfast.  Big lunch, usually  includes a  meat with 2 veggies,  salad or small sandwhich for early dinner.    BP 130/70 mmHg  Pulse 83  Temp(Src) 97.9 F (36.6 C) (Oral)  Resp 14  Ht 5' 2.5" (1.588 m)  Wt 208 lb 1.9 oz (94.403 kg)  BMI 37.44 kg/m2  SpO2 97%  Wt Readings from Last 3 Encounters:  07/07/14 208 lb 1.9 oz (94.403 kg)  03/13/14 211 lb 4 oz (95.822 kg)  09/22/13 217 lb (98.431 kg)     Past Medical History  Diagnosis Date  . Congenital absence of one kidney   . History of sciatica   . Depression     treated at [facility]  . Hypertension   . hypothyrodism     Past Surgical History  Procedure Laterality  Date  . Combined hysterectomy abdominal w/ a&p repair / oophorectomy  1996    benign tumor  . Breast biopsy  2011    [facility]< benign  . Inner ear surgery      bilateral  . Abdominal hysterectomy         The following portions of the patient's history were reviewed and updated as appropriate: Allergies, current medications, and problem list.    Review of Systems:   Patient denies headache, fevers, malaise, unintentional weight loss, skin rash, eye pain, sinus congestion and sinus pain, sore throat, dysphagia,  hemoptysis , cough, dyspnea, wheezing, chest pain, palpitations, orthopnea, edema, abdominal pain, nausea, melena, diarrhea, constipation, flank pain, dysuria, hematuria, urinary  Frequency, nocturia, numbness, tingling, seizures,  Focal weakness, Loss of consciousness,  Tremor, insomnia, depression, anxiety, and suicidal ideation.     History   Social History  . Marital Status: Single    Spouse Name: N/A  . Number of Children: N/A  . Years of Education: N/A   Occupational History  . cna     part time   Social History Main Topics  . Smoking status: Never Smoker   . Smokeless tobacco: Never Used  . Alcohol Use: No  . Drug Use: No  .  Sexual Activity: Not Currently   Other Topics Concern  . Not on file   Social History Narrative   Lives in a group home for adults          Objective:  Filed Vitals:   07/07/14 1009  BP: 130/70  Pulse: 83  Temp: 97.9 F (36.6 C)  Resp: 14     General appearance: alert, cooperative and appears stated age Ears: normal TM's and external ear canals both ears Throat: lips, mucosa, and tongue normal; teeth and gums normal Neck: no adenopathy, no carotid bruit, supple, symmetrical, trachea midline and thyroid not enlarged, symmetric, no tenderness/mass/nodules Back: symmetric, no curvature. ROM normal. No CVA tenderness. Lungs: clear to auscultation bilaterally Heart: regular rate and rhythm, S1, S2 normal, no murmur, click,  rub or gallop Abdomen: soft, non-tender; bowel sounds normal; no masses,  no organomegaly Pulses: 2+ and symmetric Skin: Skin color, texture, turgor normal. No rashes or lesions Lymph nodes: Cervical, supraclavicular, and axillary nodes normal.  Assessment and Plan:  Allergic rhinitis recommended use of antihistamine daily.    Obesity (BMI 30-39.9) I have congratulated her in reduction of   BMI and encouraged  Continued weight loss with goal of 10% of body weight over the next 6 months using a low glycemic index diet and regular exercise a minimum of 5 days per week.     Hypothyroidism Thyroid function is WNL on current dose.  No current changes needed.   Lab Results  Component Value Date   TSH 0.92 07/07/2014         Updated Medication List Outpatient Encounter Prescriptions as of 07/07/2014  Medication Sig  . albuterol (PROVENTIL HFA;VENTOLIN HFA) 108 (90 BASE) MCG/ACT inhaler Inhale 2 puffs into the lungs every 6 (six) hours as needed for wheezing or shortness of breath. WITH A SPACER  . calcium-vitamin D (OSCAL WITH D) 500-200 MG-UNIT per tablet Take 1 tablet by mouth daily with breakfast.  . hydrochlorothiazide (HYDRODIURIL) 25 MG tablet Take 1 tablet (25 mg total) by mouth daily.  [name] levothyroxine (SYNTHROID, LEVOTHROID) 112 MCG tablet TAKE 1 TABLET BY MOUTH EVERY DAY  . losartan (COZAAR) 100 MG tablet Take 1 tablet (100 mg total) by mouth daily.  [name] Umeclidinium-Vilanterol (ANORO ELLIPTA) 62.5-25 MCG/INH AEPB Inhale 1 puff into the lungs daily.  [name] levocetirizine (XYZAL) 5 MG tablet Take 1 tablet (5 mg total) by mouth every evening.     Orders Placed This Encounter  Procedures  . CBC with Differential/Platelet  . Comprehensive metabolic panel  . TSH  . Vit D  25 hydroxy (rtn osteoporosis monitoring)    Return in about 6 months (around 01/06/2015).

## 2014-07-07 NOTE — Progress Notes (Signed)
Pre visit review using our clinic review tool, if applicable. No additional management support is needed unless otherwise documented below in the visit note. 

## 2014-07-09 ENCOUNTER — Encounter: Payer: Self-pay | Admitting: Internal Medicine

## 2014-07-09 DIAGNOSIS — J309 Allergic rhinitis, unspecified: Secondary | ICD-10-CM | POA: Insufficient documentation

## 2014-07-09 NOTE — Assessment & Plan Note (Signed)
Thyroid function is WNL on current dose.  No current changes needed.   Lab Results  Component Value Date   TSH 0.92 07/07/2014

## 2014-07-09 NOTE — Assessment & Plan Note (Signed)
I have congratulated her in reduction of   BMI and encouraged  Continued weight loss with goal of 10% of body weight over the next 6 months using a low glycemic index diet and regular exercise a minimum of 5 days per week.     

## 2014-07-09 NOTE — Assessment & Plan Note (Signed)
recommended use of antihistamine daily.

## 2014-07-10 ENCOUNTER — Encounter: Payer: Self-pay | Admitting: *Deleted

## 2014-07-10 MED ORDER — ERGOCALCIFEROL 1.25 MG (50000 UT) PO CAPS: 50000.0000 [IU] | ORAL_CAPSULE | ORAL | Status: DC

## 2014-07-10 NOTE — Addendum Note (Signed)
Addended by: [name] on: 07/10/2014 01:14 PM   Modules accepted: Orders

## 2014-07-22 NOTE — H&P (Signed)
PATIENT NAME:  Lauren Bartlett, GUNDRY MR#:  [id number] DATE OF BIRTH:  Dec 28, 1955  DATE OF ADMISSION:  03/09/2013  ADMITTING PHYSICIAN: Gladstone Lighter, MD   PRIMARY CARE PHYSICIAN: Dr. Deborra Medina.   CHIEF COMPLAINT: Worsening congestion and also cough.   HISTORY OF PRESENT ILLNESS: Lauren Bartlett is a 59 year old Caucasian female with past medical history significant for hypertension, hypothyroidism who lives at home by herself, presents to the hospital secondary to worsening cough and congestion over the last three days. The patient states her symptoms started with runny nose, and cough, which was congested and productive the last three days. The cough has been clear and mucoid. She denies any fevers, but has some chills.  It was not getting better and she was got worse to the point that she is extremely weak and had some trouble breathing today.  She called her PCPs office, but because of her symptoms, she was advised to come to the ER. She is afebrile here, normal white count, slightly tachycardic heart rate in the 110 range. Her started on room air at rest are 92%; however, on ambulation with minimal exertion, sats dropping down to the low 80s. Chest x-ray revealing right middle lobe and lower lobe infiltrate, possible pneumonitis versus pneumonia, so she is being admitted for the same.   PAST MEDICAL HISTORY: 1.  Hypertension.  2.  Benign heart murmur. 3.  Hyperthyroidism.   PAST SURGICAL HISTORY: 1.  Hysterectomy.  2.  Right ear surgery in the eardrum 3 times.  3.  Left ear surgery x 1.    ALLERGIES TO MEDICATIONS: CODEINE AND PENICILLIN CAUSING HIVES,  BUT SHE HAS TAKEN AMOXICILLIN AS AN OUTPATIENT IN THE PAST.   CURRENT HOME MEDICATIONS:  1.  HCTZ 25 mg a daily.  2.  Levothyroxine 112 mcg p.o. daily.  3  Losartan 100 mg p.o. daily.   SOCIAL HISTORY: Lives at home by herself . No history of any smoking or alcohol use.    FAMILY HISTORY: Mom with multiple sclerosis and dad with heart  disease.   REVIEW OF SYSTEMS:  CONSTITUTIONAL: No fever. Positive for fatigue and weakness.  EYES: No blurred vision, double vision, inflammation or glaucoma. Uses reading glasses.  ENT: No tinnitus, ear pain, hearing loss, epistaxis or discharge.  RESPIRATORY: Positive for cough. No wheeze, hemoptysis or chronic obstructive pulmonary disease. Positive for dyspnea on exertion.  CARDIOVASCULAR: No chest pain, orthopnea, palpitations or syncope.  GASTROINTESTINAL: Positive for nausea. No vomiting, diarrhea, abdominal pain, hematemesis or melena.  GENITOURINARY: No dysuria, hematuria, renal calculus, frequency or incontinence.  ENDOCRINE: No polyuria, nocturia, thyroid problems, heat or cold intolerance.  HEMATOLOGY: No anemia, easy bruising or bleeding.  SKIN: No acne, rash or lesions.  MUSCULOSKELETAL: No neck, back, shoulder pain, arthritis or gout.  NEUROLOGIC: No numbness, weakness, CVA, transient ischemic attack or seizures.  PSYCHOLOGICAL: No anxiety, insomnia, depression.   PHYSICAL EXAMINATION: VITAL SIGNS: Temperature 97.7 degrees Fahrenheit, pulse 106, respirations 18, blood pressure 160/94, pulse oximetry 94% on room air; however, on ambulation dropped down to 88%. GENERAL: Well-built, well-nourished female sitting in bed, not in any acute distress.  HEENT: Normocephalic, atraumatic. Pupils equal, round, reacting to light. Anicteric sclerae. Extraocular movements intact. Oropharynx clear without erythema, mass or exudates.  NECK: Supple. No thyromegaly or carotid bruits. No lymphadenopathy.  LUNGS: Moving air bilaterally. Decreased breath sounds in the right middle and lower lobes posteriorly. No wheeze or crackles. No use of accessory muscles for breathing.  CARDIOVASCULAR: S1, S2 regular rate and  rhythm. No murmurs, rubs or gallops.  ABDOMEN: Soft, nontender, nondistended. No hepatosplenomegaly. Normal bowel sounds.  EXTREMITIES: No pedal edema. No clubbing or cyanosis. 2+  dorsalis pedis pulses bilaterally.  SKIN: No acne, rash or lesions.  LYMPHATIC:  No cervical or  inguinal lymphadenopathy.   NEUROLOGIC: Cranial nerves II through XII remain intact. No gross motor or sensory deficits.  PSYCHOLOGICAL: The patient is awake, alert, oriented x 3.   LABORATORY, DIAGNOSTIC AND RADIOLOGIC DATA:  WBC 7.2, hemoglobin 12.1, hematocrit 38.0, platelet count 181.   Sodium 136, potassium 3.6, chloride 100, bicarbonate 30, BUN 10, creatinine 0.78, glucose 98 and calcium of 8.4.   ALT 18, AST 23, alkaline phosphatase 82, total bilirubin 0.7, albumin of 3.6 Influenza test is done and  this is negative. Troponin less than 0.02.   Urinalysis: Negative for any infection.   Chest x-ray with right middle and lower lobe pneumonitis with small right pleural effusion.  EKG: Showing normal sinus rhythm, heart rate of 98.   ASSESSMENT AND PLAN: A 59 year old female with history of hypertension and hypothyroidism being admitted for possible pneumonia, especially hypoxic on minimal exertion.  1.  Right middle lobe and right lower lobe pneumonitis/pneumonia with hypoxia.  We will admit under observation and blood cultures have been ordered. She did receive Levaquin in the Emergency Room.  We will start Rocephin and azithromycin for community-acquired pneumonia. Continue to monitor and wean oxygen as tolerated. Cough medications as needed.  2.  Hypertension. Continue her home medications losartan and HCTZ.  3.  Hypothyroidism.  Continue Synthroid.  4.  Gastrointestinal and deep vein thrombosis prophylaxis.   CODE STATUS: Full code.   TIME SPENT ON ADMISSION: 50 minutes.     ____________________________ Gladstone Lighter, MD rk:cc D: 03/09/2013 19:00:06 ET T: 03/09/2013 19:39:19 ET JOB#: [id number]  cc: Gladstone Lighter, MD, <Dictator> Deborra Medina, MD Gladstone Lighter MD ELECTRONICALLY SIGNED 04/12/2013 14:33

## 2014-07-22 NOTE — Discharge Summary (Signed)
PATIENT NAME:  Lauren Bartlett, Lauren Bartlett MR#:  [id number] DATE OF BIRTH:  1955-08-27  DATE OF ADMISSION:  03/09/2013 DATE OF DISCHARGE:  03/14/2013  DISCHARGE DIAGNOSES: 1.  Community-acquired pneumonia.  2.  Hypothyroidism.  3.  Hypertension.  4.  Resolved hyponatremia.  DISCHARGE MEDICATIONS: 1.  Levothyroxine 112 mcg p.o. daily. 2.  Losartan 100 mg p.o. daily. 3.  Azithromycin 500 mg daily for 5 days. 4.  Prednisone tapering 20 mg 3 tablets daily for 2 days, 2 tablets daily for 2 days and 1 tablet daily for 2 days and then stop.  5.  Albuterol inhalation 2 puffs 4 times daily.  6.  Levaquin 500 mg every 24 hours for 3 days.   NOTE: The patient advised to stop HCTZ as she said that she feels very dizzy with HCTZ.  CONSULTATIONS: Physical therapy. Physical therapy recommended home health for her.   HOSPITAL COURSE: The patient is a 59 year old female patient admitted on 10th of December because of shortness of breath with cough and congestion. Primary doctor is Dr. Derrel Nip. The patient has history of hypertension and hypothyroidism. Lives by herself. Came in because of cough, congestion, and shortness of breath. The patient's heart rate was 110 on admission, sats were 92%. With ambulation sats were dropping to 80%. Chest x-ray was concerning for pneumonia in the right middle lobe. Admitted to the hospitalist service for pneumonia and started on IV antibiotics with Rocephin and Zithromax along with steroids and nebulizers and oxygen. The patient also given antitussives for cough. On admission, the patient's influenza test is negative and white count is 7.2. Electrolytes were within normal limits. Blood cultures have been negative. The patient's symptoms nicely improved with antibiotics and her breathing got better. Sats on 2 liters are 92%. She says she is on oxygen at home, 2 liters all the time, so we are going to discharge her with 2 liters. Blood pressure is 169/93 today, heart rate 106, and temperature  98. The patient's steroids were changed to p.o. prednisone. The patient has allergy to penicillin, but she tolerated the Rocephin without any allergy symptoms. We are going to give a prescription for Levaquin and Zithromax along with inhalers.   Her other diagnosis is hyponatremia. She did receive fluids when she came and sodium did drop to 128 on December 12th. I stopped the fluids and restarted her HCTZ and the sodium improved to 137. The patient says that her HCTZ makes her very dizzy, she does not want to take it, so I told her she can continue losartan and see Dr. Derrel Nip regarding follow-up of the high blood pressure. Regarding hypothyroidism, she is on Synthroid.   Deconditioning. Was seen by physical therapy. They recommended home health and will discharge her with home health physical therapy. Of note, the patient got epistaxis yesterday night, which resolved with  humidifier. She says that she has epistaxis history, sees an ENT doc,and uses humidifier. Her epistaxis resolved. She was getting aspirin, but we stopped the aspirin. The patient says that she does not take aspirin at home so that is discontinued.   TIME SPENT ON DISCHARGE PREPARATION: More than 30 minutes.  ____________________________ Epifanio Lesches, MD sk:sb D: 03/14/2013 09:55:27 ET T: 03/14/2013 10:52:23 ET JOB#: [id number]  cc: Epifanio Lesches, MD, <Dictator> Deborra Medina, MD Epifanio Lesches MD ELECTRONICALLY SIGNED 04/02/2013 17:29

## 2014-08-30 ENCOUNTER — Other Ambulatory Visit: Payer: Self-pay | Admitting: Internal Medicine

## 2014-09-11 ENCOUNTER — Other Ambulatory Visit (INDEPENDENT_AMBULATORY_CARE_PROVIDER_SITE_OTHER): Payer: Medicare Other

## 2014-09-11 ENCOUNTER — Other Ambulatory Visit: Payer: Self-pay | Admitting: *Deleted

## 2014-09-11 DIAGNOSIS — Z7901 Long term (current) use of anticoagulants: Secondary | ICD-10-CM

## 2014-09-11 LAB — FECAL OCCULT BLOOD, IMMUNOCHEMICAL: Fecal Occult Bld: NEGATIVE

## 2014-09-12 LAB — FECAL OCCULT BLOOD, GUAIAC: FECAL OCCULT BLD: NEGATIVE

## 2014-09-26 ENCOUNTER — Other Ambulatory Visit: Payer: Self-pay | Admitting: Internal Medicine

## 2014-09-28 ENCOUNTER — Other Ambulatory Visit: Payer: Self-pay | Admitting: Nurse Practitioner

## 2014-09-28 ENCOUNTER — Other Ambulatory Visit: Payer: Self-pay | Admitting: *Deleted

## 2014-09-28 MED ORDER — HYDROCHLOROTHIAZIDE 25 MG PO TABS: 25.0000 mg | ORAL_TABLET | Freq: Every day | ORAL | Status: DC

## 2014-10-23 ENCOUNTER — Ambulatory Visit
Admission: RE | Admit: 2014-10-23 | Discharge: 2014-10-23 | Disposition: A | Discharge status: Home / Self Care | Payer: Medicare Other | Source: Ambulatory Visit | Attending: Internal Medicine | Admitting: Internal Medicine

## 2014-10-23 DIAGNOSIS — Z1239 Encounter for other screening for malignant neoplasm of breast: Secondary | ICD-10-CM

## 2014-10-24 ENCOUNTER — Ambulatory Visit
Admission: RE | Admit: 2014-10-24 | Discharge: 2014-10-24 | Disposition: A | Discharge status: Home / Self Care | Payer: Medicare Other | Source: Ambulatory Visit | Attending: Internal Medicine | Admitting: Internal Medicine

## 2014-10-24 DIAGNOSIS — Z1231 Encounter for screening mammogram for malignant neoplasm of breast: Secondary | ICD-10-CM | POA: Insufficient documentation

## 2015-01-08 ENCOUNTER — Ambulatory Visit (INDEPENDENT_AMBULATORY_CARE_PROVIDER_SITE_OTHER): Payer: Medicare Other | Admitting: Internal Medicine

## 2015-01-08 ENCOUNTER — Encounter: Payer: Self-pay | Admitting: Internal Medicine

## 2015-01-08 VITALS — BP 138/84 | HR 68 | Temp 97.7°F | Resp 12 | Ht 62.5 in | Wt 198.5 lb

## 2015-01-08 DIAGNOSIS — R601 Generalized edema: Secondary | ICD-10-CM

## 2015-01-08 DIAGNOSIS — I10 Essential (primary) hypertension: Secondary | ICD-10-CM

## 2015-01-08 DIAGNOSIS — E669 Obesity, unspecified: Secondary | ICD-10-CM

## 2015-01-08 DIAGNOSIS — Z113 Encounter for screening for infections with a predominantly sexual mode of transmission: Secondary | ICD-10-CM

## 2015-01-08 DIAGNOSIS — I340 Nonrheumatic mitral (valve) insufficiency: Secondary | ICD-10-CM | POA: Diagnosis not present

## 2015-01-08 DIAGNOSIS — Z23 Encounter for immunization: Secondary | ICD-10-CM | POA: Diagnosis not present

## 2015-01-08 DIAGNOSIS — E559 Vitamin D deficiency, unspecified: Secondary | ICD-10-CM

## 2015-01-08 DIAGNOSIS — E039 Hypothyroidism, unspecified: Secondary | ICD-10-CM

## 2015-01-08 LAB — COMPREHENSIVE METABOLIC PANEL
ALBUMIN: 4.1 g/dL (ref 3.5–5.2)
ALK PHOS: 61 U/L (ref 39–117)
ALT: 13 U/L (ref 0–35)
AST: 17 U/L (ref 0–37)
BUN: 12 mg/dL (ref 6–23)
CALCIUM: 8.8 mg/dL (ref 8.4–10.5)
CO2: 31 mEq/L (ref 19–32)
CREATININE: 0.71 mg/dL (ref 0.40–1.20)
Chloride: 102 mEq/L (ref 96–112)
GFR: 89.52 mL/min (ref >60.00)
Glucose, Bld: 71 mg/dL (ref 70–99)
Potassium: 4.2 mEq/L (ref 3.5–5.1)
Sodium: 141 mEq/L (ref 135–145)
Total Bilirubin: 0.5 mg/dL (ref 0.2–1.2)
Total Protein: 7.5 g/dL (ref 6.0–8.3)

## 2015-01-08 LAB — LIPID PANEL
CHOL/HDL RATIO: 3
Cholesterol: 169 mg/dL (ref 0–200)
HDL: 48.8 mg/dL (ref >39.00)
LDL CALC: 94 mg/dL (ref 0–99)
NonHDL: 120.41
Triglycerides: 131 mg/dL (ref 0.0–149.0)
VLDL: 26.2 mg/dL (ref 0.0–40.0)

## 2015-01-08 LAB — TSH: TSH: 0.24 u[IU]/mL — ABNORMAL LOW (ref 0.35–4.50)

## 2015-01-08 LAB — VITAMIN D 25 HYDROXY (VIT D DEFICIENCY, FRACTURES): VITD: 21.35 ng/mL — AB (ref 30.00–100.00)

## 2015-01-08 NOTE — Patient Instructions (Signed)
Your are doing very well!!  You have lost a total of 64 lbs since 2012!!   I will see you again in December for your wellness exam

## 2015-01-08 NOTE — Progress Notes (Signed)
Subjective:  Patient ID: Lauren Bartlett, female    DOB: 1956/01/02  Age: 59 y.o. MRN: [id number]  CC: The primary encounter diagnosis was Encounter for immunization. Diagnoses of Severe mitral regurgitation by prior echocardiogram, Essential hypertension, Hypothyroidism, unspecified hypothyroidism type, Generalized edema, Screening for STD (sexually transmitted disease), Vitamin D deficiency, and Obesity (BMI 30-39.9) were also pertinent to this visit.  HPI Lauren Bartlett presents for follow up on hypertension, hypothyroidisn and obesity,  Has lost 10 lbs over the past 6 months.  64 lbs total since 2012.   Still walking 2 miles daily ,  Uses her walker,  Not short of breath despite a harsh systolic murmur due to aortic insufficiency and severe mitral insufficiency   Friend died of kidney failure a few weeks back,  Some sadness expressed   Outpatient Prescriptions Prior to Visit  Medication Sig Dispense Refill  . albuterol (PROVENTIL HFA;VENTOLIN HFA) 108 (90 BASE) MCG/ACT inhaler Inhale 2 puffs into the lungs every 6 (six) hours as needed for wheezing or shortness of breath. WITH A SPACER 1 Inhaler 0  . calcium-vitamin D (OSCAL WITH D) 500-200 MG-UNIT per tablet Take 1 tablet by mouth daily with breakfast. 90 tablet 2  . ergocalciferol (DRISDOL) 50000 UNITS capsule Take 1 capsule (50,000 Units total) by mouth once a week. 4 capsule 0  . hydrochlorothiazide (HYDRODIURIL) 25 MG tablet Take 1 tablet (25 mg total) by mouth daily. 30 tablet 5  . levocetirizine (XYZAL) 5 MG tablet Take 1 tablet (5 mg total) by mouth every evening. 30 tablet 5  . levothyroxine (SYNTHROID, LEVOTHROID) 112 MCG tablet TAKE 1 TABLET BY MOUTH EVERY DAY 30 tablet 9  . losartan (COZAAR) 100 MG tablet TAKE 1 TABLET BY MOUTH DAILY 30 tablet 5  . Umeclidinium-Vilanterol (ANORO ELLIPTA) 62.5-25 MCG/INH AEPB Inhale 1 puff into the lungs daily.     No facility-administered medications prior to visit.    Review of Systems;  Patient  denies headache, fevers, malaise, unintentional weight loss, skin rash, eye pain, sinus congestion and sinus pain, sore throat, dysphagia,  hemoptysis , cough, dyspnea, wheezing, chest pain, palpitations, orthopnea, edema, abdominal pain, nausea, melena, diarrhea, constipation, flank pain, dysuria, hematuria, urinary  Frequency, nocturia, numbness, tingling, seizures,  Focal weakness, Loss of consciousness,  Tremor, insomnia, depression, anxiety, and suicidal ideation.      Objective:  BP 138/84 mmHg  Pulse 68  Temp(Src) 97.7 F (36.5 C) (Oral)  Resp 12  Ht 5' 2.5" (1.588 m)  Wt 198 lb 8 oz (90.039 kg)  BMI 35.71 kg/m2  SpO2 97%  BP Readings from Last 3 Encounters:  01/08/15 138/84  07/07/14 130/70  03/13/14 140/83    Wt Readings from Last 3 Encounters:  01/08/15 198 lb 8 oz (90.039 kg)  07/07/14 208 lb 1.9 oz (94.403 kg)  03/13/14 211 lb 4 oz (95.822 kg)    General appearance: alert, cooperative and appears stated age Ears: normal TM's and external ear canals both ears Throat: lips, mucosa, and tongue normal; teeth and gums normal Neck: no adenopathy, no carotid bruit, supple, symmetrical, trachea midline and thyroid not enlarged, symmetric, no tenderness/mass/nodules Back: symmetric, no curvature. ROM normal. No CVA tenderness. Lungs: clear to auscultation bilaterally Heart: regular rate and rhythm, S1, S2 normal, no murmur, click, rub or gallop Abdomen: soft, non-tender; bowel sounds normal; no masses,  no organomegaly Pulses: 2+ and symmetric Skin: Skin color, texture, turgor normal. No rashes or lesions Lymph nodes: Cervical, supraclavicular, and axillary nodes normal.  No results  found for: HGBA1C  Lab Results  Component Value Date   CREATININE 0.71 01/08/2015   CREATININE 0.79 07/07/2014   CREATININE 0.9 09/22/2013    Lab Results  Component Value Date   WBC 6.1 07/07/2014   HGB 13.1 07/07/2014   HCT 39.7 07/07/2014   PLT 171.0 07/07/2014   GLUCOSE 71  01/08/2015   CHOL 169 01/08/2015   TRIG 131.0 01/08/2015   HDL 48.80 01/08/2015   LDLCALC 94 01/08/2015   ALT 13 01/08/2015   AST 17 01/08/2015   NA 141 01/08/2015   K 4.2 01/08/2015   CL 102 01/08/2015   CREATININE 0.71 01/08/2015   BUN 12 01/08/2015   CO2 31 01/08/2015   TSH 0.24* 01/08/2015   INR 1.1 02/14/2013    Mm Digital Screening Bilateral  10/24/2014   CLINICAL DATA:  Screening.  EXAM: DIGITAL SCREENING BILATERAL MAMMOGRAM WITH CAD  COMPARISON:  Previous exam(s).  ACR Breast Density Category c: The breast tissue is heterogeneously dense, which may obscure small masses.  FINDINGS: There are no findings suspicious for malignancy. Images were processed with CAD.  IMPRESSION: No mammographic evidence of malignancy. A result letter of this screening mammogram will be mailed directly to the patient.  RECOMMENDATION: Screening mammogram in one year. (Code:[id number]-B-01Y)  BI-RADS CATEGORY  1: Negative.   Electronically Signed   By: Pamelia Hoit M.D.   On: 10/24/2014 13:10    Assessment & Plan:   Problem List Items Addressed This Visit    Hypertension    Well controlled on current regimen. Renal function stable, no changes today.  Lab Results  Component Value Date   CREATININE 0.71 01/08/2015   Lab Results  Component Value Date   NA 141 01/08/2015   K 4.2 01/08/2015   CL 102 01/08/2015   CO2 31 01/08/2015            Relevant Orders   Comprehensive metabolic panel (Completed)   Lipid panel (Completed)   Obesity (BMI 30-39.9)    I have congratulated her in reduction of   BMI and encouraged  Continued weight loss with goal of 10% of body weigh over the next 6 months using a low glycemic index diet and regular exercise a minimum of 5 days per week.        Hypothyroidism    Thyroid function is overactive  on current dose, however she is asymptomatic, has normal vital signs and is losing necessary weight.  .  No current changes needed.   Lab Results  Component Value Date    TSH 0.24* 01/08/2015          Relevant Orders   TSH (Completed)   Severe mitral regurgitation by prior echocardiogram    Done March   2016 [place] . asymptomatic      Edema    Other Visit Diagnoses    Encounter for immunization    -  Primary    Screening for STD (sexually transmitted disease)        Relevant Orders    Hepatitis C antibody (Completed)    HIV antibody (Completed)    Vitamin D deficiency        Relevant Orders    Vit D  25 hydroxy (rtn osteoporosis monitoring) (Completed)      A total of 25 minutes of face to face time was spent with patient more than half of which was spent in counselling about the above mentioned conditions  and coordination of care   I am having  Ms. Yaden maintain her albuterol, Umeclidinium-Vilanterol, calcium-vitamin D, levocetirizine, ergocalciferol, levothyroxine, losartan, and hydrochlorothiazide.  No orders of the defined types were placed in this encounter.    There are no discontinued medications.  Follow-up: Return in about 2 months (around 03/10/2015).   [name], MD

## 2015-01-08 NOTE — Assessment & Plan Note (Signed)
Done March   2016 [place] . asymptomatic

## 2015-01-08 NOTE — Progress Notes (Signed)
Pre-visit discussion using our clinic review tool. No additional management support is needed unless otherwise documented below in the visit note.  

## 2015-01-09 LAB — HIV ANTIBODY (ROUTINE TESTING W REFLEX): HIV 1&2 Ab, 4th Generation: NONREACTIVE

## 2015-01-09 LAB — HEPATITIS C ANTIBODY: HCV AB: NEGATIVE

## 2015-01-09 NOTE — Assessment & Plan Note (Signed)
Well controlled on current regimen. Renal function stable, no changes today.  Lab Results  Component Value Date   CREATININE 0.71 01/08/2015   Lab Results  Component Value Date   NA 141 01/08/2015   K 4.2 01/08/2015   CL 102 01/08/2015   CO2 31 01/08/2015

## 2015-01-09 NOTE — Assessment & Plan Note (Signed)
Thyroid function is overactive  on current dose, however she is asymptomatic, has normal vital signs and is losing necessary weight.  .  No current changes needed.   Lab Results  Component Value Date   TSH 0.24* 01/08/2015

## 2015-01-09 NOTE — Assessment & Plan Note (Signed)
I have congratulated her in reduction of   BMI and encouraged  Continued weight loss with goal of 10% of body weigh over the next 6 months using a low glycemic index diet and regular exercise a minimum of 5 days per week.    

## 2015-01-11 ENCOUNTER — Encounter: Payer: Self-pay | Admitting: *Deleted

## 2015-01-11 DIAGNOSIS — E559 Vitamin D deficiency, unspecified: Secondary | ICD-10-CM | POA: Insufficient documentation

## 2015-01-11 MED ORDER — ERGOCALCIFEROL 1.25 MG (50000 UT) PO CAPS: 50000.0000 [IU] | ORAL_CAPSULE | ORAL | Status: DC

## 2015-01-11 NOTE — Addendum Note (Signed)
Addended by: [name] on: 01/11/2015 07:14 AM   Modules accepted: Orders

## 2015-01-29 ENCOUNTER — Ambulatory Visit
Admission: EM | Admit: 2015-01-29 | Discharge: 2015-01-29 | Disposition: A | Discharge status: Home / Self Care | Payer: Medicare Other | Attending: Family Medicine | Admitting: Family Medicine

## 2015-01-29 ENCOUNTER — Ambulatory Visit: Payer: Medicare Other

## 2015-01-29 ENCOUNTER — Encounter: Payer: Self-pay | Admitting: Emergency Medicine

## 2015-01-29 DIAGNOSIS — J209 Acute bronchitis, unspecified: Secondary | ICD-10-CM

## 2015-01-29 DIAGNOSIS — J42 Unspecified chronic bronchitis: Secondary | ICD-10-CM | POA: Diagnosis not present

## 2015-01-29 MED ORDER — IPRATROPIUM-ALBUTEROL 0.5-2.5 (3) MG/3ML IN SOLN
3.0000 mL | Freq: Once | RESPIRATORY_TRACT | Status: AC
Start: 2015-01-29 — End: 2015-01-29
  Administered 2015-01-29: 3 mL via RESPIRATORY_TRACT

## 2015-01-29 MED ORDER — DOXYCYCLINE HYCLATE 100 MG PO CAPS: 100.0000 mg | ORAL_CAPSULE | Freq: Two times a day (BID) | ORAL | Status: DC

## 2015-01-29 NOTE — ED Provider Notes (Signed)
CSN: [id number]     Arrival date & time 01/29/15  0844 History   First MD Initiated Contact with Patient 01/29/15 1006     Chief Complaint  Patient presents with  . Shortness of Breath  . Cough   (Consider location/radiation/quality/duration/timing/severity/associated sxs/prior Treatment) HPI Comments: 59 yo female with a h/o emphysema, h/o CHF, mitral regurgitation, presents with a 4 days h/o productive cough, wheezing, chills, and shortness of breath.  Denies any chest pains or leg swelling.   Patient is a 59 y.o. female presenting with shortness of breath and cough. The history is provided by the patient.  Shortness of Breath Associated symptoms: cough   Cough Associated symptoms: shortness of breath     Past Medical History  Diagnosis Date  . Congenital absence of one kidney   . History of sciatica   . Depression     treated at [place]  . Hypertension   . hypothyrodism    Past Surgical History  Procedure Laterality Date  . Combined hysterectomy abdominal w/ a&p repair / oophorectomy  1996    benign tumor  . Inner ear surgery      bilateral  . Abdominal hysterectomy    . Breast biopsy Right 2011    [facility]< benign   Family History  Problem Relation Age of Onset  . Multiple sclerosis Mother   . Coronary artery disease Father   . Heart disease Brother   . Breast cancer Paternal Aunt    Social History  Substance Use Topics  . Smoking status: Never Smoker   . Smokeless tobacco: Never Used  . Alcohol Use: No   OB History    No data available     Review of Systems  Respiratory: Positive for cough and shortness of breath.     Allergies  Codeine and Penicillin g  Home Medications   Prior to Admission medications   Medication Sig Start Date End Date Taking? Authorizing Provider  albuterol (PROVENTIL HFA;VENTOLIN HFA) 108 (90 BASE) MCG/ACT inhaler Inhale 2 puffs into the lungs every 6 (six) hours as needed for wheezing or shortness of breath. WITH A SPACER  03/09/13   [name], MD  calcium-vitamin D (OSCAL WITH D) 500-200 MG-UNIT per tablet Take 1 tablet by mouth daily with breakfast. 07/14/13   [name], MD  doxycycline (VIBRAMYCIN) 100 MG capsule Take 1 capsule (100 mg total) by mouth 2 (two) times daily. 01/29/15   Norval Gable, MD  ergocalciferol (DRISDOL) 50000 UNITS capsule Take 1 capsule (50,000 Units total) by mouth once a week. 07/10/14   [name], MD  ergocalciferol (DRISDOL) 50000 UNITS capsule Take 1 capsule (50,000 Units total) by mouth once a week. 01/11/15   [name], MD  hydrochlorothiazide (HYDRODIURIL) 25 MG tablet Take 1 tablet (25 mg total) by mouth daily. 09/28/14   [name], MD  levocetirizine (XYZAL) 5 MG tablet Take 1 tablet (5 mg total) by mouth every evening. 07/07/14   [name], MD  levothyroxine (SYNTHROID, LEVOTHROID) 112 MCG tablet TAKE 1 TABLET BY MOUTH EVERY DAY 09/26/14   [name], MD  losartan (COZAAR) 100 MG tablet TAKE 1 TABLET BY MOUTH DAILY 09/28/14   [name], MD  Umeclidinium-Vilanterol (ANORO ELLIPTA) 62.5-25 MCG/INH AEPB Inhale 1 puff into the lungs daily.    Historical Provider, MD   Meds Ordered and Administered this Visit   Medications  ipratropium-albuterol (DUONEB) 0.5-2.5 (3) MG/3ML nebulizer solution 3 mL (3 mLs Nebulization  Given 01/29/15 1018)    BP 137/72 mmHg  Pulse 80  Temp(Src) 97.2 F (36.2 C) (Tympanic)  Resp 20  Ht 5\' 2"  (1.575 m)  Wt 200 lb (90.719 kg)  BMI 36.57 kg/m2  SpO2 98% No data found.   Physical Exam  Constitutional: She appears well-developed and well-nourished. No distress.  HENT:  Head: Normocephalic.  Right Ear: Tympanic membrane, external ear and ear canal normal.  Left Ear: Tympanic membrane, external ear and ear canal normal.  Nose: Nose normal.  Mouth/Throat: Oropharynx is clear and moist and mucous membranes are normal.  Eyes: Conjunctivae and EOM are normal. Right eye exhibits no discharge. Left eye exhibits no  discharge. No scleral icterus.  Neck: Normal range of motion. Neck supple. No thyromegaly present.  Cardiovascular: Normal rate, regular rhythm and intact distal pulses.   Murmur heard. Pulmonary/Chest: Effort normal. No stridor. No respiratory distress. She has wheezes. She has no rales.  Diffuse expiratory and inspiratory wheezes and rhonchi throughout bilaterally  Neurological: She is alert.  Skin: Skin is warm. She is not diaphoretic.  Nursing note and vitals reviewed.   ED Course  Procedures (including critical care time)  Labs Review Labs Reviewed - No data to display  Imaging Review Dg Chest 2 View  01/29/2015  CLINICAL DATA:  Productive cough and fever for 2 days. EXAM: CHEST  2 VIEW COMPARISON:  03/12/2013 FINDINGS: Linear scarring or atelectasis at the right lung base. Left lung is clear. Heart is normal size. No effusions. No acute bony abnormality. IMPRESSION: Right basilar scarring or atelectasis. Electronically Signed   By: Rolm Baptise M.D.   On: 01/29/2015 10:44     Visual Acuity Review  Right Eye Distance:   Left Eye Distance:   Bilateral Distance:    Right Eye Near:   Left Eye Near:    Bilateral Near:         MDM   1. Chronic bronchitis with acute exacerbation [facility])      Discharge Medication List as of 01/29/2015 11:09 AM    START taking these medications   Details  doxycycline (VIBRAMYCIN) 100 MG capsule Take 1 capsule (100 mg total) by mouth 2 (two) times daily., Starting 01/29/2015, Until Discontinued, Print       1. x-ray results and diagnosis reviewed with patient/parent/guardian/family 2. rx as per orders above; reviewed possible side effects, interactions, risks and benefits  3. Patient given duoneb treatment with improveme 4. Recommend continue albuterol inhaler (patient has at home)  5. Follow-up with PCP and pulmonologist or prn if symptoms worsen or don't improve  Norval Gable, MD 01/29/15 1113

## 2015-01-29 NOTE — ED Notes (Signed)
Patient c/o SOB, cough and chest congestion that started Saturday night.

## 2015-01-30 ENCOUNTER — Telehealth: Payer: Self-pay | Admitting: Internal Medicine

## 2015-01-30 ENCOUNTER — Other Ambulatory Visit: Payer: Self-pay | Admitting: Internal Medicine

## 2015-01-30 MED ORDER — SPACER/AERO CHAMBER MOUTHPIECE MISC: 1.0000 | Freq: Once | Status: DC

## 2015-01-30 NOTE — Telephone Encounter (Signed)
Ok!! Cool Thank You! °

## 2015-01-30 NOTE — Telephone Encounter (Signed)
Completed her request

## 2015-01-30 NOTE — Telephone Encounter (Signed)
Pt called about needing a prescription for a new spacer for her inhaler. Pharmacy is [facility] in [place]. Thank You!

## 2015-02-02 ENCOUNTER — Inpatient Hospital Stay
Admission: EM | Admit: 2015-02-02 | Discharge: 2015-02-05 | DRG: 191 | Disposition: A | Discharge status: Home / Self Care | Payer: Medicare Other | Attending: Internal Medicine | Admitting: Internal Medicine

## 2015-02-02 DIAGNOSIS — Z885 Allergy status to narcotic agent status: Secondary | ICD-10-CM

## 2015-02-02 DIAGNOSIS — J441 Chronic obstructive pulmonary disease with (acute) exacerbation: Secondary | ICD-10-CM | POA: Diagnosis not present

## 2015-02-02 DIAGNOSIS — M5430 Sciatica, unspecified side: Secondary | ICD-10-CM | POA: Diagnosis present

## 2015-02-02 DIAGNOSIS — Z79899 Other long term (current) drug therapy: Secondary | ICD-10-CM

## 2015-02-02 DIAGNOSIS — I10 Essential (primary) hypertension: Secondary | ICD-10-CM | POA: Diagnosis present

## 2015-02-02 DIAGNOSIS — J440 Chronic obstructive pulmonary disease with acute lower respiratory infection: Secondary | ICD-10-CM | POA: Diagnosis not present

## 2015-02-02 DIAGNOSIS — F329 Major depressive disorder, single episode, unspecified: Secondary | ICD-10-CM | POA: Diagnosis present

## 2015-02-02 DIAGNOSIS — Z880 Allergy status to penicillin: Secondary | ICD-10-CM

## 2015-02-02 DIAGNOSIS — E039 Hypothyroidism, unspecified: Secondary | ICD-10-CM | POA: Diagnosis present

## 2015-02-02 DIAGNOSIS — J209 Acute bronchitis, unspecified: Secondary | ICD-10-CM | POA: Diagnosis present

## 2015-02-02 DIAGNOSIS — Q600 Renal agenesis, unilateral: Secondary | ICD-10-CM

## 2015-02-02 DIAGNOSIS — Z888 Allergy status to other drugs, medicaments and biological substances status: Secondary | ICD-10-CM

## 2015-02-02 HISTORY — DX: Chronic obstructive pulmonary disease, unspecified: J44.9

## 2015-02-03 ENCOUNTER — Emergency Department: Payer: Medicare Other

## 2015-02-03 ENCOUNTER — Encounter: Payer: Self-pay | Admitting: Internal Medicine

## 2015-02-03 DIAGNOSIS — J440 Chronic obstructive pulmonary disease with acute lower respiratory infection: Secondary | ICD-10-CM | POA: Diagnosis present

## 2015-02-03 DIAGNOSIS — Z888 Allergy status to other drugs, medicaments and biological substances status: Secondary | ICD-10-CM | POA: Diagnosis not present

## 2015-02-03 DIAGNOSIS — Z880 Allergy status to penicillin: Secondary | ICD-10-CM | POA: Diagnosis not present

## 2015-02-03 DIAGNOSIS — Z885 Allergy status to narcotic agent status: Secondary | ICD-10-CM | POA: Diagnosis not present

## 2015-02-03 DIAGNOSIS — Z79899 Other long term (current) drug therapy: Secondary | ICD-10-CM | POA: Diagnosis not present

## 2015-02-03 DIAGNOSIS — F329 Major depressive disorder, single episode, unspecified: Secondary | ICD-10-CM | POA: Diagnosis present

## 2015-02-03 DIAGNOSIS — J441 Chronic obstructive pulmonary disease with (acute) exacerbation: Secondary | ICD-10-CM | POA: Diagnosis present

## 2015-02-03 DIAGNOSIS — E039 Hypothyroidism, unspecified: Secondary | ICD-10-CM | POA: Diagnosis present

## 2015-02-03 DIAGNOSIS — I10 Essential (primary) hypertension: Secondary | ICD-10-CM | POA: Diagnosis present

## 2015-02-03 DIAGNOSIS — M5430 Sciatica, unspecified side: Secondary | ICD-10-CM | POA: Diagnosis present

## 2015-02-03 DIAGNOSIS — Q600 Renal agenesis, unilateral: Secondary | ICD-10-CM | POA: Diagnosis not present

## 2015-02-03 DIAGNOSIS — J209 Acute bronchitis, unspecified: Secondary | ICD-10-CM | POA: Diagnosis present

## 2015-02-03 LAB — CBC
HCT: 40.7 % (ref 35.0–47.0)
Hemoglobin: 13.3 g/dL (ref 12.0–16.0)
MCH: 29.2 pg (ref 26.0–34.0)
MCHC: 32.6 g/dL (ref 32.0–36.0)
MCV: 89.7 fL (ref 80.0–100.0)
PLATELETS: 147 10*3/uL — AB (ref 150–440)
RBC: 4.54 MIL/uL (ref 3.80–5.20)
RDW: 14.3 % (ref 11.5–14.5)
WBC: 8.8 10*3/uL (ref 3.6–11.0)

## 2015-02-03 LAB — BASIC METABOLIC PANEL
Anion gap: 6 (ref 5–15)
BUN: 15 mg/dL (ref 6–20)
CO2: 28 mmol/L (ref 22–32)
Calcium: 8 mg/dL — ABNORMAL LOW (ref 8.9–10.3)
Chloride: 106 mmol/L (ref 101–111)
Creatinine, Ser: 0.75 mg/dL (ref 0.44–1.00)
GFR calc Af Amer: >60 mL/min (ref >60)
GLUCOSE: 137 mg/dL — AB (ref 65–99)
POTASSIUM: 3.7 mmol/L (ref 3.5–5.1)
Sodium: 140 mmol/L (ref 135–145)

## 2015-02-03 LAB — BLOOD GAS, VENOUS
ACID-BASE EXCESS: 1.6 mmol/L (ref 0.0–3.0)
BICARBONATE: 29.8 meq/L — AB (ref 21.0–28.0)
PATIENT TEMPERATURE: 37
pCO2, Ven: 62 mmHg — ABNORMAL HIGH (ref 44.0–60.0)
pH, Ven: 7.29 — ABNORMAL LOW (ref 7.320–7.430)

## 2015-02-03 LAB — TROPONIN I: Troponin I: <0.03 ng/mL (ref <0.031)

## 2015-02-03 MED ORDER — TIOTROPIUM BROMIDE MONOHYDRATE 18 MCG IN CAPS
18.0000 ug | ORAL_CAPSULE | Freq: Every day | RESPIRATORY_TRACT | Status: DC
Start: 2015-02-03 — End: 2015-02-05
  Administered 2015-02-03 – 2015-02-05 (×3): 18 ug via RESPIRATORY_TRACT
  Filled 2015-02-03: qty 5

## 2015-02-03 MED ORDER — MAGNESIUM SULFATE 2 GM/50ML IV SOLN
2.0000 g | Freq: Once | INTRAVENOUS | Status: AC
  Administered 2015-02-03: 2 g via INTRAVENOUS
  Filled 2015-02-03: qty 50

## 2015-02-03 MED ORDER — LOSARTAN POTASSIUM 50 MG PO TABS
100.0000 mg | ORAL_TABLET | Freq: Every day | ORAL | Status: DC
  Administered 2015-02-03 – 2015-02-05 (×3): 100 mg via ORAL
  Filled 2015-02-03 (×3): qty 2

## 2015-02-03 MED ORDER — IPRATROPIUM-ALBUTEROL 0.5-2.5 (3) MG/3ML IN SOLN
3.0000 mL | RESPIRATORY_TRACT | Status: DC
  Administered 2015-02-03 (×5): 3 mL via RESPIRATORY_TRACT
  Filled 2015-02-03 (×5): qty 3

## 2015-02-03 MED ORDER — ACETAMINOPHEN 325 MG PO TABS: 650.0000 mg | ORAL_TABLET | Freq: Four times a day (QID) | ORAL | Status: DC | PRN

## 2015-02-03 MED ORDER — MOMETASONE FURO-FORMOTEROL FUM 100-5 MCG/ACT IN AERO
2.0000 | INHALATION_SPRAY | Freq: Two times a day (BID) | RESPIRATORY_TRACT | Status: DC
  Administered 2015-02-03 – 2015-02-05 (×5): 2 via RESPIRATORY_TRACT
  Filled 2015-02-03: qty 8.8

## 2015-02-03 MED ORDER — CETIRIZINE HCL 10 MG PO TABS
10.0000 mg | ORAL_TABLET | Freq: Every day | ORAL | Status: DC
  Administered 2015-02-03 – 2015-02-04 (×2): 10 mg via ORAL
  Filled 2015-02-03 (×3): qty 1

## 2015-02-03 MED ORDER — ACETAMINOPHEN 650 MG RE SUPP
650.0000 mg | Freq: Four times a day (QID) | RECTAL | Status: DC | PRN
Start: 2015-02-03 — End: 2015-02-05

## 2015-02-03 MED ORDER — ONDANSETRON HCL 4 MG PO TABS: 4.0000 mg | ORAL_TABLET | Freq: Four times a day (QID) | ORAL | Status: DC | PRN

## 2015-02-03 MED ORDER — LEVOTHYROXINE SODIUM 112 MCG PO TABS
112.0000 ug | ORAL_TABLET | Freq: Every day | ORAL | Status: DC
  Administered 2015-02-03 – 2015-02-05 (×3): 112 ug via ORAL
  Filled 2015-02-03 (×4): qty 1

## 2015-02-03 MED ORDER — LEVOCETIRIZINE DIHYDROCHLORIDE 5 MG PO TABS: 5.0000 mg | ORAL_TABLET | Freq: Every evening | ORAL | Status: DC

## 2015-02-03 MED ORDER — IPRATROPIUM-ALBUTEROL 0.5-2.5 (3) MG/3ML IN SOLN
3.0000 mL | Freq: Once | RESPIRATORY_TRACT | Status: AC
Start: 2015-02-03 — End: 2015-02-03
  Administered 2015-02-03: 3 mL via RESPIRATORY_TRACT
  Filled 2015-02-03: qty 3

## 2015-02-03 MED ORDER — CALCIUM CARBONATE-VITAMIN D 500-200 MG-UNIT PO TABS
1.0000 | ORAL_TABLET | Freq: Every day | ORAL | Status: DC
  Administered 2015-02-03 – 2015-02-05 (×3): 1 via ORAL
  Filled 2015-02-03 (×3): qty 1

## 2015-02-03 MED ORDER — HYDRALAZINE HCL 20 MG/ML IJ SOLN
10.0000 mg | INTRAMUSCULAR | Status: DC | PRN
Start: 2015-02-03 — End: 2015-02-05

## 2015-02-03 MED ORDER — HEPARIN SODIUM (PORCINE) 5000 UNIT/ML IJ SOLN
5000.0000 [IU] | Freq: Three times a day (TID) | INTRAMUSCULAR | Status: DC
  Administered 2015-02-03 – 2015-02-05 (×7): 5000 [IU] via SUBCUTANEOUS
  Filled 2015-02-03 (×7): qty 1

## 2015-02-03 MED ORDER — METHYLPREDNISOLONE SODIUM SUCC 125 MG IJ SOLR
60.0000 mg | INTRAMUSCULAR | Status: DC
  Administered 2015-02-03 – 2015-02-05 (×3): 60 mg via INTRAVENOUS
  Filled 2015-02-03 (×3): qty 2

## 2015-02-03 MED ORDER — UMECLIDINIUM-VILANTEROL 62.5-25 MCG/INH IN AEPB: 1.0000 | INHALATION_SPRAY | Freq: Every day | RESPIRATORY_TRACT | Status: DC

## 2015-02-03 MED ORDER — ONDANSETRON HCL 4 MG/2ML IJ SOLN: 4.0000 mg | Freq: Four times a day (QID) | INTRAMUSCULAR | Status: DC | PRN

## 2015-02-03 MED ORDER — SODIUM CHLORIDE 0.9 % IV SOLN
INTRAVENOUS | Status: DC
  Administered 2015-02-03 – 2015-02-04 (×3): via INTRAVENOUS

## 2015-02-03 NOTE — ED Notes (Signed)
Helped pt. To use bathroom in room.

## 2015-02-03 NOTE — Progress Notes (Signed)
   02/03/15 1000  Clinical Encounter Type  Visited With Patient  Visit Type Initial  Referral From Nurse  Consult/Referral To Chaplain  Advance Directives (For Healthcare)  Does patient have an advance directive? No  Would patient like information on creating an advanced directive? Yes - Scientist, clinical (histocompatibility and immunogenetics) given  Provided pastoral presence, support and AD education and forms.  Pt will contact chaplain if she wishes to complete.  [place] [phone]

## 2015-02-03 NOTE — H&P (Signed)
[facility] [place] NAME: Lauren Bartlett    MR#:  [id number]  DATE OF BIRTH:  [birth year]   DATE OF ADMISSION:  02/02/2015  PRIMARY CARE PHYSICIAN: [name], MD   REQUESTING/REFERRING PHYSICIAN: Dahlia Client  CHIEF COMPLAINT:   Chief Complaint  Patient presents with  . Shortness of Breath    Pt. here via EMS from home for shortness of breath.    HISTORY OF PRESENT ILLNESS:  November Lauren Bartlett  is a 59 y.o. female with a known history of 59 year old Caucasian female history of COPD requiring oxygen at nighttime only presenting with shortness of breath. She describes approximately 4 day duration shortness of breath progressively worsening however daily worsened this evening. Has associated dry nonproductive cough without fever or chills. She was started on doxycycline by her PCP for bronchitis without improvement of symptoms. Given worsening shortness of breath brought to the Hospital further workup and evaluation. She was given Solu-Medrol as well as breathing treatments with EMS minimal improvement. Emergency department course received multiple breathing treatments as well as magnesium some improvement however not yet at baseline.  PAST MEDICAL HISTORY:   Past Medical History  Diagnosis Date  . Congenital absence of one kidney   . History of sciatica   . Depression     treated at [place]  . Hypertension   . hypothyrodism   . COPD (chronic obstructive pulmonary disease) ([place])     PAST SURGICAL HISTORY:   Past Surgical History  Procedure Laterality Date  . Combined hysterectomy abdominal w/ a&p repair / oophorectomy  1996    benign tumor  . Inner ear surgery      bilateral  . Abdominal hysterectomy    . Breast biopsy Right 2011    [facility]< benign    SOCIAL HISTORY:   Social History  Substance Use Topics  . Smoking status: Never Smoker   . Smokeless tobacco: Never Used  . Alcohol Use: No    FAMILY HISTORY:    Family History  Problem Relation Age of Onset  . Multiple sclerosis Mother   . Coronary artery disease Father   . Heart disease Brother   . Breast cancer Paternal Aunt     DRUG ALLERGIES:   Allergies  Allergen Reactions  . Codeine Hives and Nausea And Vomiting  . Penicillin G Itching  . Prednisone Palpitations    REVIEW OF SYSTEMS:  REVIEW OF SYSTEMS:  CONSTITUTIONAL: Denies fevers, chills, fatigue, weakness.  EYES: Denies blurred vision, double vision, or eye pain.  EARS, NOSE, THROAT: Denies tinnitus, ear pain, hearing loss.  RESPIRATORY: Positive cough, shortness of breath, wheezing  CARDIOVASCULAR: Denies chest pain, palpitations, edema.  GASTROINTESTINAL: Denies nausea, vomiting, diarrhea, abdominal pain.  GENITOURINARY: Denies dysuria, hematuria.  ENDOCRINE: Denies nocturia or thyroid problems. HEMATOLOGIC AND LYMPHATIC: Denies easy bruising or bleeding.  SKIN: Denies rash or lesions.  MUSCULOSKELETAL: Denies pain in neck, back, shoulder, knees, hips, or further arthritic symptoms.  NEUROLOGIC: Denies paralysis, paresthesias.  PSYCHIATRIC: Denies anxiety or depressive symptoms. Otherwise full review of systems performed by me is negative.   MEDICATIONS AT HOME:   Prior to Admission medications   Medication Sig Start Date End Date Taking? Authorizing Provider  albuterol (PROVENTIL HFA;VENTOLIN HFA) 108 (90 BASE) MCG/ACT inhaler Inhale 2 puffs into the lungs every 6 (six) hours as needed for wheezing or shortness of breath. WITH A SPACER 03/09/13   [name], MD  calcium-vitamin D (OSCAL WITH D)  500-200 MG-UNIT per tablet Take 1 tablet by mouth daily with breakfast. 07/14/13   [name], MD  doxycycline (VIBRAMYCIN) 100 MG capsule Take 1 capsule (100 mg total) by mouth 2 (two) times daily. 01/29/15   Norval Gable, MD  ergocalciferol (DRISDOL) 50000 UNITS capsule Take 1 capsule (50,000 Units total) by mouth once a week. 07/10/14   [name], MD   ergocalciferol (DRISDOL) 50000 UNITS capsule Take 1 capsule (50,000 Units total) by mouth once a week. 01/11/15   [name], MD  hydrochlorothiazide (HYDRODIURIL) 25 MG tablet Take 1 tablet (25 mg total) by mouth daily. 09/28/14   [name], MD  levocetirizine (XYZAL) 5 MG tablet Take 1 tablet (5 mg total) by mouth every evening. 07/07/14   [name], MD  levothyroxine (SYNTHROID, LEVOTHROID) 112 MCG tablet TAKE 1 TABLET BY MOUTH EVERY DAY 09/26/14   [name], MD  losartan (COZAAR) 100 MG tablet TAKE 1 TABLET BY MOUTH DAILY 09/28/14   [name], MD  Spacer/Aero Chamber Mouthpiece MISC 1 applicator by Does not apply route once. 01/30/15   [name], MD  Umeclidinium-Vilanterol (ANORO ELLIPTA) 62.5-25 MCG/INH AEPB Inhale 1 puff into the lungs daily.    Historical Provider, MD      VITAL SIGNS:  Blood pressure 176/97, pulse 112, temperature 98.3 F (36.8 C), temperature source Oral, resp. rate 30, height 5\' 2"  (1.575 m), weight 200 lb (90.719 kg), SpO2 94 %.  PHYSICAL EXAMINATION:  VITAL SIGNS: Filed Vitals:   02/03/15 0006  BP: 176/97  Pulse: 112  Temp: 98.3 F (36.8 C)  Resp: 54   GENERAL:59 y.o.female currently in minimal acute distress. Given respiratory status  HEAD: Normocephalic, atraumatic.  EYES: Pupils equal, round, reactive to light. Extraocular muscles intact. No scleral icterus.  MOUTH: Moist mucosal membrane. Dentition intact. No abscess noted.  EAR, NOSE, THROAT: Clear without exudates. No external lesions.  NECK: Supple. No thyromegaly. No nodules. No JVD.  PULMONARY: Greatly diminished breath sounds throughout all lung fields more so lower lung fields with diffuse scant expiratory wheezing no frank rhonchi. Tachypneic without use of accessory muscles, Good respiratory effort poorr entry bilaterally CHEST: Nontender to palpation.  CARDIOVASCULAR: S1 and S2.Tachycardic. No murmurs, rubs, or gallops. No edema. Pedal pulses 2+ bilaterally.   GASTROINTESTINAL: Soft, nontender, nondistended. No masses. Positive bowel sounds. No hepatosplenomegaly.  MUSCULOSKELETAL: No swelling, clubbing, or edema. Range of motion full in all extremities.  NEUROLOGIC: Cranial nerves II through XII are intact. No gross focal neurological deficits. Sensation intact. Reflexes intact.  SKIN: No ulceration, lesions, rashes, or cyanosis. Skin warm and dry. Turgor intact.  PSYCHIATRIC: Mood, affect within normal limits. The patient is awake, alert and oriented x 3. Insight, judgment intact.    LABORATORY PANEL:   CBC  Recent Labs Lab 02/03/15 0016  WBC 8.8  HGB 13.3  HCT 40.7  PLT 147*   ------------------------------------------------------------------------------------------------------------------  Chemistries   Recent Labs Lab 02/03/15 0016  NA 140  K 3.7  CL 106  CO2 28  GLUCOSE 137*  BUN 15  CREATININE 0.75  CALCIUM 8.0*   ------------------------------------------------------------------------------------------------------------------  Cardiac Enzymes  Recent Labs Lab 02/03/15 0016  TROPONINI <0.03   ------------------------------------------------------------------------------------------------------------------  RADIOLOGY:  Dg Chest Portable 1 View  02/03/2015  CLINICAL DATA:  Dyspnea, onset Friday morning. EXAM: PORTABLE CHEST 1 VIEW COMPARISON:  01/29/2015 FINDINGS: A single AP portable view of the chest demonstrates no focal airspace consolidation or alveolar edema. The lungs are grossly  clear. There is no large effusion or pneumothorax. Cardiac and mediastinal contours appear unremarkable. IMPRESSION: No active disease. Electronically Signed   By: Andreas Newport M.D.   On: 02/03/2015 00:18    EKG:   Orders placed or performed during the hospital encounter of 02/02/15  . EKG 12-Lead  . EKG 12-Lead  . ED EKG  . ED EKG  . EKG 12-Lead  . EKG 12-Lead    IMPRESSION AND PLAN:   59 year old Caucasian female  history of COPD oxygen requiring at nighttime presenting with shortness of breath.  1.Chronic obstructive pulmonary disease exacerbation: Provide DuoNeb treatments q. 4 hours, Solu-Medrol 60 mg IV q. daily, no azithromycin given lack sputum production. Continue with home medications.  2. Essential hypertension Cozaar 3. Hypothyroidism unspecified Synthroid 4. Venous thromboembolism prophylactic: Heparin subcutaneous     All the records are reviewed and case discussed with ED provider. Management plans discussed with the patient, family and they are in agreement.  CODE STATUS: Full  TOTAL TIME TAKING CARE OF THIS PATIENT: 35  minutes.    [name],  [name] on 02/03/2015 at 3:00 AM  Between 7am to 6pm - Pager - [phone]  After 6pm: House Pager: - [phone]  [facility]  Office  [phone]  CC: Primary care physician; [name], MD

## 2015-02-03 NOTE — Plan of Care (Signed)
Problem: Safety: Goal: Ability to remain free from injury will improve Outcome: Progressing Generalized weakness and SOB w/ exertion. +1 assist w/ cane. Pt understands how to call for assistance. Room clean & cords off floor providing safe environment.   Problem: Activity: Goal: Risk for activity intolerance will decrease Outcome: Progressing SOB w/ exertion. O2 sats stable on 2L. Pt advised to slowly get up out of bed and take frequent rest periods.   Problem: Education: Goal: Knowledge of disease or condition will improve Outcome: Progressing Reviewed information about COPD and ways to prevent exacerbations.   Problem: Respiratory: Goal: Ability to maintain a clear airway will improve Outcome: Progressing Pt encouraged to deep breath & cough. Pt acknowledged and demonstrated throughout the night. Goal: Levels of oxygenation will improve Outcome: Progressing O2 sats stable on 2L. Duoneb treatments & solumedrol given w/ noted improvement.

## 2015-02-03 NOTE — ED Provider Notes (Signed)
[facility] Emergency Department Provider Note  ____________________________________________  Time seen: Approximately 2353 PM  I have reviewed the triage vital signs and the nursing notes.   HISTORY  Chief Complaint Shortness of Breath    HPI Lauren Bartlett is a 59 y.o. female who comes into the hospital today with shortness of breath. The patient has a history of COPD and emphysema. The patient was diagnosed with bronchitis on Monday. She reports that she was given a medication to treat her bronchitis but she does not remember the name. The patient reports she was feeling improved for a couple of days but then today she started feeling worse. The patient called EMS when she became severely short of breath. She has had to duo nebs, 2 albuterol nebs and 125 mg of Solu-Medrol. According to EMS the patient also had high blood pressure. They did not check a room air O2 sat was 97% while she was taking a nap. The patient reports that she was not put on steroids. She has been using her inhaler at home but reports it has not been helping with the shortness of breath. The patient denies any chest pain but says she's had some chest tightness, she's had nausea with no fever. She's had no sweats but she has had chills. The patient denies pain and is here for further evaluation.The patient does not wear O2 at home.   Past Medical History  Diagnosis Date  . Congenital absence of one kidney   . History of sciatica   . Depression     treated at [place]  . Hypertension   . hypothyrodism   . COPD (chronic obstructive pulmonary disease) [facility])     Patient Active Problem List   Diagnosis Date Noted  . COPD exacerbation ([place]) 02/03/2015  . Vitamin D deficiency 01/11/2015  . S/P hysterectomy with oophorectomy 09/22/2013  . Pulmonary hypertension ([place]) 07/14/2013  . Edema 01/12/2013  . Hypothyroidism 05/04/2012  . Severe mitral regurgitation by prior echocardiogram  01/11/2012  . Obesity (BMI 30-39.9) 08/04/2011  . Depression   . Hypertension   . Congenital absence of one kidney   . History of sciatica     Past Surgical History  Procedure Laterality Date  . Combined hysterectomy abdominal w/ a&p repair / oophorectomy  1996    benign tumor  . Inner ear surgery      bilateral  . Abdominal hysterectomy    . Breast biopsy Right 2011    [facility]< benign    Current Outpatient Rx  Name  Route  Sig  Dispense  Refill  . albuterol (PROVENTIL HFA;VENTOLIN HFA) 108 (90 BASE) MCG/ACT inhaler   Inhalation   Inhale 2 puffs into the lungs every 6 (six) hours as needed for wheezing or shortness of breath. WITH A SPACER   1 Inhaler   0   . calcium-vitamin D (OSCAL WITH D) 500-200 MG-UNIT per tablet   Oral   Take 1 tablet by mouth daily with breakfast.   90 tablet   2   . doxycycline (VIBRAMYCIN) 100 MG capsule   Oral   Take 1 capsule (100 mg total) by mouth 2 (two) times daily.   14 capsule   0   . ergocalciferol (DRISDOL) 50000 UNITS capsule   Oral   Take 1 capsule (50,000 Units total) by mouth once a week.   4 capsule   0   . ergocalciferol (DRISDOL) 50000 UNITS capsule   Oral   Take 1 capsule (50,000  Units total) by mouth once a week.   12 capsule   0   . hydrochlorothiazide (HYDRODIURIL) 25 MG tablet   Oral   Take 1 tablet (25 mg total) by mouth daily.   30 tablet   5   . levocetirizine (XYZAL) 5 MG tablet   Oral   Take 1 tablet (5 mg total) by mouth every evening.   30 tablet   5   . levothyroxine (SYNTHROID, LEVOTHROID) 112 MCG tablet      TAKE 1 TABLET BY MOUTH EVERY DAY   30 tablet   9   . losartan (COZAAR) 100 MG tablet      TAKE 1 TABLET BY MOUTH DAILY   30 tablet   5   . Spacer/Aero Chamber Mouthpiece MISC   Does not apply   1 applicator by Does not apply route once.   1 each   0     Patient needs spacer and mouthpiece   . Umeclidinium-Vilanterol (ANORO ELLIPTA) 62.5-25 MCG/INH AEPB   Inhalation   Inhale 1  puff into the lungs daily.           Allergies Codeine; Penicillin g; and Prednisone  Family History  Problem Relation Age of Onset  . Multiple sclerosis Mother   . Coronary artery disease Father   . Heart disease Brother   . Breast cancer Paternal Aunt     Social History Social History  Substance Use Topics  . Smoking status: Never Smoker   . Smokeless tobacco: Never Used  . Alcohol Use: No    Review of Systems Constitutional: No fever/chills Eyes: No visual changes. ENT: No sore throat. Cardiovascular: Chest tightness Respiratory:  shortness of breath. Gastrointestinal: No abdominal pain.  No nausea, no vomiting.  No diarrhea.  No constipation. Genitourinary: Negative for dysuria. Musculoskeletal: Negative for back pain. Skin: Negative for rash. Neurological: Negative for headaches, focal weakness or numbness.  10-point ROS otherwise negative.  ____________________________________________   PHYSICAL EXAM:  VITAL SIGNS: ED Triage Vitals  Enc Vitals Group     BP 02/03/15 0006 176/97 mmHg     Pulse Rate 02/03/15 0006 112     Resp 02/03/15 0006 30     Temp 02/03/15 0006 98.3 F (36.8 C)     Temp Source 02/03/15 0006 Oral     SpO2 02/03/15 0006 94 %     Weight 02/03/15 0006 200 lb (90.719 kg)     Height 02/03/15 0006 5\' 2"  (1.575 m)     Head Cir --      Peak Flow --      Pain Score 02/03/15 0008 5     Pain Loc --      Pain Edu? --      Excl. in [place]? --     Constitutional: Alert and oriented. Well appearing and in no acute distress. Eyes: Conjunctivae are normal. PERRL. EOMI. Head: Atraumatic. Nose: No congestion/rhinnorhea. Mouth/Throat: Mucous membranes are moist.  Oropharynx non-erythematous. Cardiovascular: Normal rate, regular rhythm. Systolic murmur grade 3/6  Good peripheral circulation. Respiratory: Increased respiratory effort.  With retractions. Expiratory wheezes in all lung fields Gastrointestinal: Soft and nontender. No distention.  Positive bowel sounds Musculoskeletal: No lower extremity tenderness nor edema.   Neurologic:  Normal speech and language.  Skin:  Skin is warm, dry and intact.  Psychiatric: Mood and affect are normal.   ____________________________________________   LABS (all labs ordered are listed, but only abnormal results are displayed)  Labs Reviewed  CBC - Abnormal;  Notable for the following:    Platelets 147 (*)    All other components within normal limits  BASIC METABOLIC PANEL - Abnormal; Notable for the following:    Glucose, Bld 137 (*)    Calcium 8.0 (*)    All other components within normal limits  BLOOD GAS, VENOUS - Abnormal; Notable for the following:    pH, Ven 7.29 (*)    pCO2, Ven 62 (*)    Bicarbonate 29.8 (*)    All other components within normal limits  TROPONIN I  CBC  CREATININE, SERUM   ____________________________________________  EKG  ED ECG REPORT I, Loney Hering, the attending physician, personally viewed and interpreted this ECG.   Date: 02/03/2015  EKG Time: 0049  Rate: 111  Rhythm: sinus tachycardia  Axis: normal  Intervals:none  ST&T Change: none  ____________________________________________  RADIOLOGY  Chest x-ray: No active disease ____________________________________________   PROCEDURES  Procedure(s) performed: None  Critical Care performed: No  ____________________________________________   INITIAL IMPRESSION / ASSESSMENT AND PLAN / ED COURSE  Pertinent labs & imaging results that were available during my care of the patient were reviewed by me and considered in my medical decision making (see chart for details).  This is a 59 year old female who comes in today with shortness of breath and what seems like a COPD exacerbation. The patient received large amount of medication fro  EMS so I did give her third DuoNeb as well as some magnesium sulfate. After the medication the patient's wheezing improved significantly. Given  the abnormal blood gas I feel that I will admit the patient to the hospitalist service for further treatment of her COPD exacerbation. The patient did take doxycycline this week so I will hold off on her other antibiotics as the patient does not have pneumonia. The patient will be admitted to the hospitalist service for further treatment and evaluation. ____________________________________________   FINAL CLINICAL IMPRESSION(S) / ED DIAGNOSES  Final diagnoses:  COPD exacerbation ([place])      Loney Hering, MD 02/03/15 [phone]

## 2015-02-03 NOTE — Plan of Care (Signed)
Problem: Activity: Goal: Ability to implement measures to reduce episodes of fatigue will improve Outcome: Progressing 1. Reports increased strength with breathing improved. 2. Assisted pt with decisions on activity levels and tolerance.     Problem: Education: Goal: Knowledge of disease or condition will improve Outcome: Progressing 1. Verbalized improved understanding of respiratory condition and management including new dulera and spiriva. 2. Reviewed educational materials with pt verbalizing limited understanding but improved since this a.m.  Problem: Respiratory: Goal: Ability to maintain a clear airway will improve Outcome: Completed/Met Date Met:  02/03/15 Drinking adequate fluids. Pt demonstrated and verbalized increased understanding of importance/benefit of deepbreathing and productive/clearing coughs.

## 2015-02-03 NOTE — ED Notes (Signed)
Pt. States she was dx with bronchitis this past Monday.  Pt. States Friday evening she went for her daily walk and became short of breath.

## 2015-02-03 NOTE — ED Notes (Signed)
Pt. States having shortness of breath that started Friday morning.  Pt. States she went to [facility] urgent care on Monday and was dx with bronchitis.   EMS gave pt. 2 duo nebs and 2 albuterol treatments.  Pt. Was also given 125 mg solumedrol and 4 mg Zofran.

## 2015-02-03 NOTE — Progress Notes (Signed)
[place] at [facility]                                                                                                                                                                                            Patient Demographics   Lauren Bartlett, is a 59 y.o. female, DOB - [birth year], DUK:[id number]  Admit date - 02/02/2015   Admitting Physician Lytle Butte, MD  Outpatient Primary MD for the patient is [name], MD   LOS - 0  Subjective: Patient admitted with COPD exasperation shortness of breath is improved     Review of Systems:   CONSTITUTIONAL: No documented fever. No fatigue, weakness. No weight gain, no weight loss.  EYES: No blurry or double vision.  ENT: No tinnitus. No postnasal drip. No redness of the oropharynx.  RESPIRATORY: Positive cough, no wheeze, no hemoptysis. Positive dyspnea.  CARDIOVASCULAR: No chest pain. No orthopnea. No palpitations. No syncope.  GASTROINTESTINAL: No nausea, no vomiting or diarrhea. No abdominal pain. No melena or hematochezia.  GENITOURINARY: No dysuria or hematuria.  ENDOCRINE: No polyuria or nocturia. No heat or cold intolerance.  HEMATOLOGY: No anemia. No bruising. No bleeding.  INTEGUMENTARY: No rashes. No lesions.  MUSCULOSKELETAL: No arthritis. No swelling. No gout.  NEUROLOGIC: No numbness, tingling, or ataxia. No seizure-type activity.  PSYCHIATRIC: No anxiety. No insomnia. No ADD.    Vitals:   Filed Vitals:   02/03/15 0339 02/03/15 0553 02/03/15 0805 02/03/15 1147  BP:  142/62    Pulse:  97    Temp:  98.1 F (36.7 C)    TempSrc:  Oral    Resp:  18    Height:      Weight:      SpO2: 97% 98% 96% 94%    Wt Readings from Last 3 Encounters:  02/03/15 90.719 kg (200 lb)  01/29/15 90.719 kg (200 lb)  01/08/15 90.039 kg (198 lb 8 oz)     Intake/Output Summary (Last 24 hours) at 02/03/15 1332 Last data filed at 02/03/15 1016  Gross per 24 hour  Intake    240 ml  Output       0 ml  Net    240 ml    Physical Exam:   GENERAL: Pleasant-appearing in no apparent distress.  HEAD, EYES, EARS, NOSE AND THROAT: Atraumatic, normocephalic. Extraocular muscles are intact. Pupils equal and reactive to light. Sclerae anicteric. No conjunctival injection. No oro-pharyngeal erythema.  NECK: Supple. There is no jugular venous distention. No bruits, no lymphadenopathy, no thyromegaly.  HEART: Regular rate and rhythm,. No murmurs, no rubs, no clicks.  LUNGS: Bilateral wheezing No rales or rhonchi. No asseory muscle usage ABDOMEN: Soft, flat, nontender, nondistended. Has good bowel sounds. No hepatosplenomegaly appreciated.  EXTREMITIES: No evidence of any cyanosis, clubbing, or peripheral edema.  +2 pedal and radial pulses bilaterally.  NEUROLOGIC: The patient is alert, awake, and oriented x3 with no focal motor or sensory deficits appreciated bilaterally.  SKIN: Moist and warm with no rashes appreciated.  Psych: Not anxious, depressed LN: No inguinal LN enlargement    Antibiotics   Anti-infectives    None      Medications   Scheduled Meds: . calcium-vitamin D  1 tablet Oral Q breakfast  . cetirizine  10 mg Oral Daily  . heparin  5,000 Units Subcutaneous 3 times per day  . ipratropium-albuterol  3 mL Nebulization Q4H  . levothyroxine  112 mcg Oral QAC breakfast  . losartan  100 mg Oral Daily  . methylPREDNISolone (SOLU-MEDROL) injection  60 mg Intravenous Q24H  . mometasone-formoterol  2 puff Inhalation BID  . tiotropium  18 mcg Inhalation Daily   Continuous Infusions: . sodium chloride 75 mL/hr at 02/03/15 0302   PRN Meds:.acetaminophen **OR** acetaminophen, hydrALAZINE, ondansetron **OR** ondansetron (ZOFRAN) IV   Data Review:   Micro Results No results found for this or any previous visit (from the past 240 hour(s)).  Radiology Reports Dg Chest 2 View  01/29/2015  CLINICAL DATA:  Productive cough and fever for 2 days. EXAM: CHEST  2 VIEW COMPARISON:   03/12/2013 FINDINGS: Linear scarring or atelectasis at the right lung base. Left lung is clear. Heart is normal size. No effusions. No acute bony abnormality. IMPRESSION: Right basilar scarring or atelectasis. Electronically Signed   By: Rolm Baptise M.D.   On: 01/29/2015 10:44   Dg Chest Portable 1 View  02/03/2015  CLINICAL DATA:  Dyspnea, onset Friday morning. EXAM: PORTABLE CHEST 1 VIEW COMPARISON:  01/29/2015 FINDINGS: A single AP portable view of the chest demonstrates no focal airspace consolidation or alveolar edema. The lungs are grossly clear. There is no large effusion or pneumothorax. Cardiac and mediastinal contours appear unremarkable. IMPRESSION: No active disease. Electronically Signed   By: Andreas Newport M.D.   On: 02/03/2015 00:18     CBC  Recent Labs Lab 02/03/15 0016  WBC 8.8  HGB 13.3  HCT 40.7  PLT 147*  MCV 89.7  MCH 29.2  MCHC 32.6  RDW 14.3    Chemistries   Recent Labs Lab 02/03/15 0016  NA 140  K 3.7  CL 106  CO2 28  GLUCOSE 137*  BUN 15  CREATININE 0.75  CALCIUM 8.0*   ------------------------------------------------------------------------------------------------------------------ estimated creatinine clearance is 79.2 mL/min (by C-G formula based on Cr of 0.75). ------------------------------------------------------------------------------------------------------------------ No results for input(s): HGBA1C in the last 72 hours. ------------------------------------------------------------------------------------------------------------------ No results for input(s): CHOL, HDL, LDLCALC, TRIG, CHOLHDL, LDLDIRECT in the last 72 hours. ------------------------------------------------------------------------------------------------------------------ No results for input(s): TSH, T4TOTAL, T3FREE, THYROIDAB in the last 72 hours.  Invalid input(s):  FREET3 ------------------------------------------------------------------------------------------------------------------ No results for input(s): VITAMINB12, FOLATE, FERRITIN, TIBC, IRON, RETICCTPCT in the last 72 hours.  Coagulation profile No results for input(s): INR, PROTIME in the last 168 hours.  No results for input(s): DDIMER in the last 72 hours.  Cardiac Enzymes  Recent Labs Lab 02/03/15 0016  TROPONINI <0.03   ------------------------------------------------------------------------------------------------------------------ Invalid input(s): POCBNP    Assessment & Plan   59 year old Caucasian female history of COPD oxygen requiring at nighttime presenting with shortness of breath.  1.acute on chronic Chronic obstructive pulmonary disease exacerbation:  Continue nebulizers, Solu-Medrol. Continue Dilaudid and Spiriva.  2. Essential hypertension continue Cozaar low-pressure stable 3. Hypothyroidism unspecified continue Synthroid 4. Venous thromboembolism prophylactic: Heparin subcutaneous     Code Status Orders        Start     Ordered   02/03/15 0232  Full code   Continuous     02/03/15 0232           Consults  none   DVT Prophylaxis   Heparin   Lab Results  Component Value Date   PLT 147* 02/03/2015     Time Spent in minutes  45 minutes    Dustin Flock M.D on 02/03/2015 at 1:32 PM  Between 7am to 6pm - Pager - [phone]  After 6pm go to [email or web address] - password [email or web address] [facility]   Office  [phone]

## 2015-02-03 NOTE — ED Notes (Signed)
Called respiratory to pick up venous blood gas in room.

## 2015-02-04 MED ORDER — CEFUROXIME AXETIL 500 MG PO TABS
500.0000 mg | ORAL_TABLET | Freq: Two times a day (BID) | ORAL | Status: DC
  Administered 2015-02-04 – 2015-02-05 (×3): 500 mg via ORAL
  Filled 2015-02-04 (×3): qty 1

## 2015-02-04 MED ORDER — IPRATROPIUM-ALBUTEROL 0.5-2.5 (3) MG/3ML IN SOLN: 3.0000 mL | RESPIRATORY_TRACT | Status: DC | PRN

## 2015-02-04 MED ORDER — IPRATROPIUM-ALBUTEROL 0.5-2.5 (3) MG/3ML IN SOLN
3.0000 mL | Freq: Four times a day (QID) | RESPIRATORY_TRACT | Status: DC
  Administered 2015-02-04 – 2015-02-05 (×6): 3 mL via RESPIRATORY_TRACT
  Filled 2015-02-04 (×6): qty 3

## 2015-02-04 MED ORDER — SODIUM CHLORIDE 0.9 % IJ SOLN
3.0000 mL | Freq: Two times a day (BID) | INTRAMUSCULAR | Status: DC
  Administered 2015-02-04 – 2015-02-05 (×3): 3 mL via INTRAVENOUS

## 2015-02-04 NOTE — Plan of Care (Signed)
Problem: Safety: Goal: Ability to remain free from injury will improve Outcome: Progressing Pt is a high fall risk, calls for assistance appropriately.  Problem: Activity: Goal: Ability to implement measures to reduce episodes of fatigue will improve Outcome: Progressing Pt is standby assist to the bathroom. Pt understands importance of walking around and improve ability to safely transport self.  Problem: Respiratory: Goal: Levels of oxygenation will improve Outcome: Progressing Pt is alert and oriented, no c/o pain at this time. Pt reports breathing feels better.

## 2015-02-04 NOTE — Plan of Care (Signed)
Problem: Safety: Goal: Ability to remain free from injury will improve Outcome: Progressing Ambulates with cane and requests SB+ to get OOB. Ambulated to Rm 18 and back with SB+ and tolerated well on RA. No injury this shift.   Problem: Activity: Goal: Risk for activity intolerance will decrease Outcome: Progressing More active today and tolerated walking without 02. Reports breathing much improved; producing small amount clear/white sputum/lung sounds improved. Ceftin po started. IVF's discontinued.  Encouraged pt to be OOB/sit in chair and ambuate with SB+. Pt did rest inbetween activities. Pt anticipating being discharged tomorrow to home.

## 2015-02-04 NOTE — Progress Notes (Signed)
[place] at [facility]                                                                                                                                                                                            Patient Demographics   Lauren Bartlett, is a 59 y.o. female, DOB - 17-May-1955, DDU:[id number]  Admit date - 02/02/2015   Admitting Physician Lytle Butte, MD  Outpatient Primary MD for the patient is [name], MD   LOS - 1  Subjective:  Continues to be short of breath. Requiring oxygen has some cough. Productive    Review of Systems:   CONSTITUTIONAL: No documented fever. No fatigue, weakness. No weight gain, no weight loss.  EYES: No blurry or double vision.  ENT: No tinnitus. No postnasal drip. No redness of the oropharynx.  RESPIRATORY: Positive cough, no wheeze, no hemoptysis. Positive dyspnea.  CARDIOVASCULAR: No chest pain. No orthopnea. No palpitations. No syncope.  GASTROINTESTINAL: No nausea, no vomiting or diarrhea. No abdominal pain. No melena or hematochezia.  GENITOURINARY: No dysuria or hematuria.  ENDOCRINE: No polyuria or nocturia. No heat or cold intolerance.  HEMATOLOGY: No anemia. No bruising. No bleeding.  INTEGUMENTARY: No rashes. No lesions.  MUSCULOSKELETAL: No arthritis. No swelling. No gout.  NEUROLOGIC: No numbness, tingling, or ataxia. No seizure-type activity.  PSYCHIATRIC: No anxiety. No insomnia. No ADD.    Vitals:   Filed Vitals:   02/03/15 2117 02/04/15 0540 02/04/15 0819 02/04/15 1156  BP: 122/59 123/65    Pulse: 94 85    Temp: 97.7 F (36.5 C) 97.5 F (36.4 C)    TempSrc: Oral Oral    Resp: 18 20    Height:      Weight:      SpO2: 98% 98% 94% 93%    Wt Readings from Last 3 Encounters:  02/03/15 90.719 kg (200 lb)  01/29/15 90.719 kg (200 lb)  01/08/15 90.039 kg (198 lb 8 oz)     Intake/Output Summary (Last 24 hours) at 02/04/15 1222 Last data filed at 02/04/15 0900  Gross per 24  hour  Intake   2802 ml  Output      0 ml  Net   2802 ml    Physical Exam:   GENERAL: Pleasant-appearing in no apparent distress.  HEAD, EYES, EARS, NOSE AND THROAT: Atraumatic, normocephalic. Extraocular muscles are intact. Pupils equal and reactive to light. Sclerae anicteric. No conjunctival injection. No oro-pharyngeal erythema.  NECK: Supple. There is no jugular venous distention. No bruits, no lymphadenopathy, no thyromegaly.  HEART: Regular rate and rhythm,. No murmurs, no rubs,  no clicks.  LUNGS: Bilateral wheezing No rales or rhonchi. No asseory muscle usage ABDOMEN: Soft, flat, nontender, nondistended. Has good bowel sounds. No hepatosplenomegaly appreciated.  EXTREMITIES: No evidence of any cyanosis, clubbing, or peripheral edema.  +2 pedal and radial pulses bilaterally.  NEUROLOGIC: The patient is alert, awake, and oriented x3 with no focal motor or sensory deficits appreciated bilaterally.  SKIN: Moist and warm with no rashes appreciated.  Psych: Not anxious, depressed LN: No inguinal LN enlargement    Antibiotics   Anti-infectives    Start     Dose/Rate Route Frequency Ordered Stop   02/04/15 0815  cefUROXime (CEFTIN) tablet 500 mg     500 mg Oral 2 times daily with meals 02/04/15 0813        Medications   Scheduled Meds: . calcium-vitamin D  1 tablet Oral Q breakfast  . cefUROXime  500 mg Oral BID WC  . cetirizine  10 mg Oral Daily  . heparin  5,000 Units Subcutaneous 3 times per day  . ipratropium-albuterol  3 mL Nebulization QID  . levothyroxine  112 mcg Oral QAC breakfast  . losartan  100 mg Oral Daily  . methylPREDNISolone (SOLU-MEDROL) injection  60 mg Intravenous Q24H  . mometasone-formoterol  2 puff Inhalation BID  . sodium chloride  3 mL Intravenous Q12H  . tiotropium  18 mcg Inhalation Daily   Continuous Infusions:   PRN Meds:.acetaminophen **OR** acetaminophen, hydrALAZINE, ipratropium-albuterol, ondansetron **OR** ondansetron (ZOFRAN)  IV   Data Review:   Micro Results No results found for this or any previous visit (from the past 240 hour(s)).  Radiology Reports Dg Chest 2 View  01/29/2015  CLINICAL DATA:  Productive cough and fever for 2 days. EXAM: CHEST  2 VIEW COMPARISON:  03/12/2013 FINDINGS: Linear scarring or atelectasis at the right lung base. Left lung is clear. Heart is normal size. No effusions. No acute bony abnormality. IMPRESSION: Right basilar scarring or atelectasis. Electronically Signed   By: Rolm Baptise M.D.   On: 01/29/2015 10:44   Dg Chest Portable 1 View  02/03/2015  CLINICAL DATA:  Dyspnea, onset Friday morning. EXAM: PORTABLE CHEST 1 VIEW COMPARISON:  01/29/2015 FINDINGS: A single AP portable view of the chest demonstrates no focal airspace consolidation or alveolar edema. The lungs are grossly clear. There is no large effusion or pneumothorax. Cardiac and mediastinal contours appear unremarkable. IMPRESSION: No active disease. Electronically Signed   By: Andreas Newport M.D.   On: 02/03/2015 00:18     CBC  Recent Labs Lab 02/03/15 0016  WBC 8.8  HGB 13.3  HCT 40.7  PLT 147*  MCV 89.7  MCH 29.2  MCHC 32.6  RDW 14.3    Chemistries   Recent Labs Lab 02/03/15 0016  NA 140  K 3.7  CL 106  CO2 28  GLUCOSE 137*  BUN 15  CREATININE 0.75  CALCIUM 8.0*   ------------------------------------------------------------------------------------------------------------------ estimated creatinine clearance is 79.2 mL/min (by C-G formula based on Cr of 0.75). ------------------------------------------------------------------------------------------------------------------ No results for input(s): HGBA1C in the last 72 hours. ------------------------------------------------------------------------------------------------------------------ No results for input(s): CHOL, HDL, LDLCALC, TRIG, CHOLHDL, LDLDIRECT in the last 72  hours. ------------------------------------------------------------------------------------------------------------------ No results for input(s): TSH, T4TOTAL, T3FREE, THYROIDAB in the last 72 hours.  Invalid input(s): FREET3 ------------------------------------------------------------------------------------------------------------------ No results for input(s): VITAMINB12, FOLATE, FERRITIN, TIBC, IRON, RETICCTPCT in the last 72 hours.  Coagulation profile No results for input(s): INR, PROTIME in the last 168 hours.  No results for input(s): DDIMER in the last 72 hours.  Cardiac Enzymes  Recent Labs Lab 02/03/15 0016  TROPONINI <0.03   ------------------------------------------------------------------------------------------------------------------ Invalid input(s): POCBNP    Assessment & Plan   59 year old Caucasian female history of COPD oxygen requiring at nighttime presenting with shortness of breath.  1.acute on chronic Chronic obstructive pulmonary disease exacerbation: Continue nebulizers, Solu-Medrol. Continue Dilaudid and Spiriva. Add Ceftin for acute bronchitis Patient may need oxygen for home discharge she chronically uses oxygen at only nighttime 2. Essential hypertension continue Cozaar blood pressures currently stable 3. Hypothyroidism unspecified continue Synthroid 4. Venous thromboembolism prophylactic: Heparin subcutaneous     Code Status Orders        Start     Ordered   02/03/15 0232  Full code   Continuous     02/03/15 0232           Consults  none   DVT Prophylaxis   Heparin   Lab Results  Component Value Date   PLT 147* 02/03/2015     Time Spent in minutes  35 minutes    Dustin Flock M.D on 02/04/2015 at 12:22 PM  Between 7am to 6pm - Pager - [phone]  After 6pm go to [email or web address] - password [email or web address] [facility]   Office  [phone]

## 2015-02-04 NOTE — Progress Notes (Signed)
Ambulated in hall around NS with cane and SB+ and tolerated well on room air.

## 2015-02-04 NOTE — Plan of Care (Signed)
Problem: Activity: Goal: Ability to implement measures to reduce episodes of fatigue will improve Outcome: Completed/Met Date Met:  02/04/15 Pt developed measures and goals that she was developed herself and goals to progress to.  Problem: Education: Goal: Knowledge of disease or condition will improve Outcome: Progressing Verbalizes and demonstrates correct understanding of lung condition and appropriate care. Education done with stated understanding.  Problem: Respiratory: Goal: Levels of oxygenation will improve Outcome: Completed/Met Date Met:  02/04/15 Pulse ox stable in normal limits off 02 during the today which is her normal. Pt regularly wears 02 at night at home.

## 2015-02-04 NOTE — Plan of Care (Signed)
Problem: Activity: Goal: Ability to implement measures to reduce episodes of fatigue will improve Outcome: Progressing No dysnea/distress today. Currently off 02 and tolerating well. Ceftin po started and tolerated well. Self scheduling periods of rest. Reports fatigue much improved.

## 2015-02-05 ENCOUNTER — Telehealth: Payer: Self-pay | Admitting: Internal Medicine

## 2015-02-05 ENCOUNTER — Telehealth: Payer: Self-pay | Admitting: *Deleted

## 2015-02-05 MED ORDER — CEFUROXIME AXETIL 500 MG PO TABS: 500.0000 mg | ORAL_TABLET | Freq: Two times a day (BID) | ORAL | Status: DC

## 2015-02-05 MED ORDER — GUAIFENESIN 100 MG/5ML PO SYRP: 200.0000 mg | ORAL_SOLUTION | Freq: Three times a day (TID) | ORAL | Status: DC | PRN

## 2015-02-05 NOTE — Progress Notes (Signed)
Patient discharged per MD order. IV removed and in tact. All discharge instructions given and all questions answered.

## 2015-02-05 NOTE — Telephone Encounter (Signed)
Pt called back about not able to come in tomorrow for her HFU appt. Pt states she is still to weak. No appt avail to sch. Let me know where to sch once spoken to the doctor. Thank you!

## 2015-02-05 NOTE — Telephone Encounter (Signed)
Patients do not need to be scheduled so soon after Hospital discharge .  Next week is fine

## 2015-02-05 NOTE — Telephone Encounter (Signed)
FYI, HFU pt is being discharged from hospital today. Diagnosis is COPD. Pt appt is scheduled for 02/06/2015 @3 :00pm. No later appt available. Thank You!

## 2015-02-05 NOTE — Discharge Summary (Signed)
Lauren Bartlett, 59 y.o., DOB Nov 06, 1955, MRN [id number]. Admission date: 02/02/2015 Discharge Date 02/05/2015 Primary MD [name], MD Admitting Physician Lytle Butte, MD  Admission Diagnosis  COPD exacerbation [facility]) [J44.1]  Discharge Diagnosis   Principal Problem:   COPD exacerbation ([place])  acute bronchitis Essential hypertension Hypothyroidism Congenital absence of one kidney History of sciatica Depression       Hospital Course  Lauren Bartlett is a 59 y.o. female with a known history of 59 year old Caucasian female history of COPD requiring oxygen at nighttime only presenting with shortness of breath. Patient was seen in the emergency room and was noted to have acute COPD exasperation. She was admitted and treated with nebulizers and antibiotics. Her breathing is much improved and is doing much better and is stable for discharge home.          Consults  None  Significant Tests:  See full reports for all details    Dg Chest 2 View  01/29/2015  CLINICAL DATA:  Productive cough and fever for 2 days. EXAM: CHEST  2 VIEW COMPARISON:  03/12/2013 FINDINGS: Linear scarring or atelectasis at the right lung base. Left lung is clear. Heart is normal size. No effusions. No acute bony abnormality. IMPRESSION: Right basilar scarring or atelectasis. Electronically Signed   By: Rolm Baptise M.D.   On: 01/29/2015 10:44   Dg Chest Portable 1 View  02/03/2015  CLINICAL DATA:  Dyspnea, onset Friday morning. EXAM: PORTABLE CHEST 1 VIEW COMPARISON:  01/29/2015 FINDINGS: A single AP portable view of the chest demonstrates no focal airspace consolidation or alveolar edema. The lungs are grossly clear. There is no large effusion or pneumothorax. Cardiac and mediastinal contours appear unremarkable. IMPRESSION: No active disease. Electronically Signed   By: Andreas Newport M.D.   On: 02/03/2015 00:18       Today   Subjective:   Lauren Bartlett patient feels better shortness of breath improved   Objective:   Blood pressure 140/82, pulse 94, temperature 98.8 F (37.1 C), temperature source Oral, resp. rate 18, height 5\' 2"  (1.575 m), weight 90.719 kg (200 lb), SpO2 99 %.  .  Intake/Output Summary (Last 24 hours) at 02/05/15 1320 Last data filed at 02/05/15 0739  Gross per 24 hour  Intake    720 ml  Output      0 ml  Net    720 ml    Exam VITAL SIGNS: Blood pressure 140/82, pulse 94, temperature 98.8 F (37.1 C), temperature source Oral, resp. rate 18, height 5\' 2"  (1.575 m), weight 90.719 kg (200 lb), SpO2 99 %.  GENERAL:  59 y.o.-year-old patient lying in the bed with no acute distress.  EYES: Pupils equal, round, reactive to light and accommodation. No scleral icterus. Extraocular muscles intact.  HEENT: Head atraumatic, normocephalic. Oropharynx and nasopharynx clear.  NECK:  Supple, no jugular venous distention. No thyroid enlargement, no tenderness.  LUNGS: Normal breath sounds bilaterally, no wheezing, rales,rhonchi or crepitation. No use of accessory muscles of respiration.  CARDIOVASCULAR: S1, S2 normal. No murmurs, rubs, or gallops.  ABDOMEN: Soft, nontender, nondistended. Bowel sounds present. No organomegaly or mass.  EXTREMITIES: No pedal edema, cyanosis, or clubbing.  NEUROLOGIC: Cranial nerves II through XII are intact. Muscle strength 5/5 in all extremities. Sensation intact. Gait not checked.  PSYCHIATRIC: The patient is alert and oriented x 3.  SKIN: No obvious rash, lesion, or ulcer.   Data Review     CBC w Diff: Lab Results  Component Value Date  WBC 8.8 02/03/2015   WBC 10.8 03/11/2013   HGB 13.3 02/03/2015   HGB 11.6* 03/11/2013   HCT 40.7 02/03/2015   HCT 35.1 03/11/2013   PLT 147* 02/03/2015   PLT 165 03/11/2013   LYMPHOPCT 23.0 07/07/2014   LYMPHOPCT 4.4 03/11/2013   MONOPCT 7.4 07/07/2014   MONOPCT 2.4 03/11/2013   EOSPCT 4.8 07/07/2014   EOSPCT 0.0 03/11/2013   BASOPCT 0.5 07/07/2014   BASOPCT 0.1 03/11/2013   CMP: Lab Results   Component Value Date   NA 140 02/03/2015   NA 137 03/13/2013   K 3.7 02/03/2015   K 4.0 03/13/2013   CL 106 02/03/2015   CL 102 03/13/2013   CO2 28 02/03/2015   CO2 31 03/13/2013   BUN 15 02/03/2015   BUN 25* 03/13/2013   CREATININE 0.75 02/03/2015   CREATININE 0.77 03/13/2013   CREATININE 0.70 08/04/2011   PROT 7.5 01/08/2015   PROT 8.2 03/09/2013   ALBUMIN 4.1 01/08/2015   ALBUMIN 3.6 03/09/2013   BILITOT 0.5 01/08/2015   BILITOT 0.5 03/09/2013   ALKPHOS 61 01/08/2015   ALKPHOS 82 03/09/2013   AST 17 01/08/2015   AST 23 03/09/2013   ALT 13 01/08/2015   ALT 18 03/09/2013  .  Micro Results No results found for this or any previous visit (from the past 240 hour(s)).      Code Status Orders        Start     Ordered   02/03/15 0232  Full code   Continuous     02/03/15 0232          Follow-up Information    Follow up with TULLO, Aris Everts, MD In 7 days.   Specialty:  Internal Medicine   Why:  02/06/15 @03 :00   Contact information:   [address] [place] [address] [phone]       Discharge Medications     Medication List    STOP taking these medications        doxycycline 100 MG capsule  Commonly known as:  VIBRAMYCIN      TAKE these medications        albuterol 108 (90 BASE) MCG/ACT inhaler  Commonly known as:  PROVENTIL HFA;VENTOLIN HFA  Inhale 2 puffs into the lungs every 6 (six) hours as needed for wheezing or shortness of breath. WITH A SPACER     ANORO ELLIPTA 62.5-25 MCG/INH Aepb  Generic drug:  Umeclidinium-Vilanterol  Inhale 1 puff into the lungs daily.     calcium-vitamin D 500-200 MG-UNIT tablet  Commonly known as:  OSCAL WITH D  Take 1 tablet by mouth daily with breakfast.     cefUROXime 500 MG tablet  Commonly known as:  CEFTIN  Take 1 tablet (500 mg total) by mouth 2 (two) times daily with a meal.     ergocalciferol 50000 UNITS capsule  Commonly known as:  DRISDOL  Take 1 capsule (50,000 Units  total) by mouth once a week.     ergocalciferol 50000 UNITS capsule  Commonly known as:  DRISDOL  Take 1 capsule (50,000 Units total) by mouth once a week.     guaifenesin 100 MG/5ML syrup  Commonly known as:  ROBITUSSIN CHEST CONGESTION  Take 10 mLs (200 mg total) by mouth 3 (three) times daily as needed for cough.     hydrochlorothiazide 25 MG tablet  Commonly known as:  HYDRODIURIL  Take 1 tablet (25 mg total) by mouth daily.  levocetirizine 5 MG tablet  Commonly known as:  XYZAL  Take 1 tablet (5 mg total) by mouth every evening.     levothyroxine 112 MCG tablet  Commonly known as:  SYNTHROID, LEVOTHROID  TAKE 1 TABLET BY MOUTH EVERY DAY     losartan 100 MG tablet  Commonly known as:  COZAAR  TAKE 1 TABLET BY MOUTH DAILY     Spacer/Aero Chamber Mouthpiece Misc  1 applicator by Does not apply route once.           Total Time in preparing paper work, data evaluation and todays exam - 35 minutes  Dustin Flock M.D on 02/05/2015 at 1:20 PM  [facility]   Office  [phone]

## 2015-02-05 NOTE — Discharge Instructions (Signed)
DIET:  Cardiac diet  DISCHARGE CONDITION:  Stable  ACTIVITY:  Activity as tolerated with cane  OXYGEN:  Home Oxygen: Yes.     Oxygen Delivery: 2 liters/min via Patient connected to nasal cannula oxygen at night time  DISCHARGE LOCATION:  home    ADDITIONAL DISCHARGE INSTRUCTION:   If you experience worsening of your admission symptoms, develop shortness of breath, life threatening emergency, suicidal or homicidal thoughts you must seek medical attention immediately by calling 911 or calling your MD immediately  if symptoms less severe.  You Must read complete instructions/literature along with all the possible adverse reactions/side effects for all the Medicines you take and that have been prescribed to you. Take any new Medicines after you have completely understood and accpet all the possible adverse reactions/side effects.   Please note  You were cared for by a hospitalist during your hospital stay. If you have any questions about your discharge medications or the care you received while you were in the hospital after you are discharged, you can call the unit and asked to speak with the hospitalist on call if the hospitalist that took care of you is not available. Once you are discharged, your primary care physician will handle any further medical issues. Please note that NO REFILLS for any discharge medications will be authorized once you are discharged, as it is imperative that you return to your primary care physician (or establish a relationship with a primary care physician if you do not have one) for your aftercare needs so that they can reassess your need for medications and monitor your lab values.   Chronic Obstructive Pulmonary Disease Exacerbation Chronic obstructive pulmonary disease (COPD) is a common lung problem. In COPD, the flow of air from the lungs is limited. COPD exacerbations are times that breathing gets worse and you need extra treatment. Without treatment  they can be life threatening. If they happen often, your lungs can become more damaged. If your COPD gets worse, your doctor may treat you with:  Medicines.  Oxygen.  Different ways to clear your airway, such as using a mask. HOME CARE  Do not smoke.  Avoid tobacco smoke and other things that bother your lungs.  If given, take your antibiotic medicine as told. Finish the medicine even if you start to feel better.  Only take medicines as told by your doctor.  Drink enough fluids to keep your pee (urine) clear or pale yellow (unless your doctor has told you not to).  Use a cool mist machine (vaporizer).  If you use oxygen or a machine that turns liquid medicine into a mist (nebulizer), continue to use them as told.  Keep up with shots (vaccinations) as told by your doctor.  Exercise regularly.  Eat healthy foods.  Keep all doctor visits as told. GET HELP RIGHT AWAY IF:  You are very short of breath and it gets worse.  You have trouble talking.  You have bad chest pain.  You have blood in your spit (sputum).  You have a fever.  You keep throwing up (vomiting).  You feel weak, or you pass out (faint).  You feel confused.  You keep getting worse. MAKE SURE YOU:  Understand these instructions.  Will watch your condition.  Will get help right away if you are not doing well or get worse.   This information is not intended to replace advice given to you by your health care provider. Make sure you discuss any questions you have with  your health care provider.   Document Released: 03/06/2011 Document Revised: 04/07/2014 Document Reviewed: 11/19/2012 Elsevier Interactive Patient Education [employer].

## 2015-02-05 NOTE — Plan of Care (Signed)
Problem: Safety: Goal: Ability to remain free from injury will improve Outcome: Progressing Continues on high falls precautions per policy  Problem: Activity: Goal: Risk for activity intolerance will decrease Outcome: Progressing Ambulates in room with assist. SOB on exertion, continues on 2L 02 at night.

## 2015-02-05 NOTE — Telephone Encounter (Signed)
Please advise, as patient is unable to complete the scheduled appointment.

## 2015-02-05 NOTE — Care Management Important Message (Signed)
Important Message  Patient Details  Name: Lauren Bartlett MRN: [id number] Date of Birth: 04/23/1955   Medicare Important Message Given:  Yes-second notification given    Shelbie Ammons, RN 02/05/2015, 10:11 AM

## 2015-02-05 NOTE — Care Management (Signed)
Admitted to this facility with the diagnosis of COPD. Lives alone in a senior living apartment complex in [place]. Father is Lana Fish. [phone]). Last seen Dr. Derrel Nip October 10th. No home health. No skilled facility. Home oxygen thru [facility] at night only  2 liters per nasal cannula x 1 year. Uses a rolling walker to aid in ambulation. Takes care of all basic activities of daily living herself, drives. No falls. Good appetite.  Discharge to home today per Dr. Posey Pronto.  Father will be transporting. Shelbie Ammons RN MSN CCM Care Management [phone]

## 2015-02-05 NOTE — Telephone Encounter (Signed)
Please advise? Lauren Bartlett

## 2015-02-06 ENCOUNTER — Ambulatory Visit (INDEPENDENT_AMBULATORY_CARE_PROVIDER_SITE_OTHER): Payer: Medicare Other | Admitting: Internal Medicine

## 2015-02-06 ENCOUNTER — Telehealth: Payer: Self-pay

## 2015-02-06 DIAGNOSIS — J441 Chronic obstructive pulmonary disease with (acute) exacerbation: Secondary | ICD-10-CM

## 2015-02-06 NOTE — Telephone Encounter (Signed)
Followed up with TCM call/appointment and patient declined.  She reports she is doing well and does not want to come in at this time.  Patient requests to wait and return at next scheduled follow up visit. Encouraged patient to call and make an appointment or go to the ER if condition worsens

## 2015-02-08 NOTE — Assessment & Plan Note (Signed)
.  patient was too weak to keep appt for hospital follow up

## 2015-02-08 NOTE — Progress Notes (Signed)
Patient called to cancel scheduled hospital follow up appointment same day due to weakness.

## 2015-02-16 ENCOUNTER — Encounter: Payer: Self-pay | Admitting: General Practice

## 2015-02-16 ENCOUNTER — Emergency Department: Payer: Medicare Other

## 2015-02-16 ENCOUNTER — Observation Stay
Admission: EM | Admit: 2015-02-16 | Discharge: 2015-02-18 | Disposition: A | Discharge status: Home / Self Care | Payer: Medicare Other | Attending: Internal Medicine | Admitting: Internal Medicine

## 2015-02-16 DIAGNOSIS — Z8249 Family history of ischemic heart disease and other diseases of the circulatory system: Secondary | ICD-10-CM | POA: Insufficient documentation

## 2015-02-16 DIAGNOSIS — Z803 Family history of malignant neoplasm of breast: Secondary | ICD-10-CM | POA: Insufficient documentation

## 2015-02-16 DIAGNOSIS — R05 Cough: Secondary | ICD-10-CM | POA: Diagnosis not present

## 2015-02-16 DIAGNOSIS — E039 Hypothyroidism, unspecified: Secondary | ICD-10-CM | POA: Diagnosis not present

## 2015-02-16 DIAGNOSIS — Z905 Acquired absence of kidney: Secondary | ICD-10-CM | POA: Insufficient documentation

## 2015-02-16 DIAGNOSIS — R0982 Postnasal drip: Secondary | ICD-10-CM | POA: Insufficient documentation

## 2015-02-16 DIAGNOSIS — Z7951 Long term (current) use of inhaled steroids: Secondary | ICD-10-CM | POA: Insufficient documentation

## 2015-02-16 DIAGNOSIS — Z90710 Acquired absence of both cervix and uterus: Secondary | ICD-10-CM | POA: Diagnosis not present

## 2015-02-16 DIAGNOSIS — Z888 Allergy status to other drugs, medicaments and biological substances status: Secondary | ICD-10-CM | POA: Insufficient documentation

## 2015-02-16 DIAGNOSIS — Z885 Allergy status to narcotic agent status: Secondary | ICD-10-CM | POA: Diagnosis not present

## 2015-02-16 DIAGNOSIS — Z880 Allergy status to penicillin: Secondary | ICD-10-CM | POA: Insufficient documentation

## 2015-02-16 DIAGNOSIS — Z9981 Dependence on supplemental oxygen: Secondary | ICD-10-CM | POA: Insufficient documentation

## 2015-02-16 DIAGNOSIS — E669 Obesity, unspecified: Secondary | ICD-10-CM | POA: Diagnosis not present

## 2015-02-16 DIAGNOSIS — Z6836 Body mass index (BMI) 36.0-36.9, adult: Secondary | ICD-10-CM | POA: Insufficient documentation

## 2015-02-16 DIAGNOSIS — F329 Major depressive disorder, single episode, unspecified: Secondary | ICD-10-CM | POA: Diagnosis not present

## 2015-02-16 DIAGNOSIS — I340 Nonrheumatic mitral (valve) insufficiency: Secondary | ICD-10-CM | POA: Diagnosis not present

## 2015-02-16 DIAGNOSIS — F419 Anxiety disorder, unspecified: Secondary | ICD-10-CM | POA: Insufficient documentation

## 2015-02-16 DIAGNOSIS — R0902 Hypoxemia: Secondary | ICD-10-CM | POA: Insufficient documentation

## 2015-02-16 DIAGNOSIS — Z820 Family history of epilepsy and other diseases of the nervous system: Secondary | ICD-10-CM | POA: Diagnosis not present

## 2015-02-16 DIAGNOSIS — I272 Other secondary pulmonary hypertension: Secondary | ICD-10-CM | POA: Insufficient documentation

## 2015-02-16 DIAGNOSIS — I10 Essential (primary) hypertension: Secondary | ICD-10-CM | POA: Insufficient documentation

## 2015-02-16 DIAGNOSIS — J441 Chronic obstructive pulmonary disease with (acute) exacerbation: Principal | ICD-10-CM | POA: Insufficient documentation

## 2015-02-16 DIAGNOSIS — R531 Weakness: Secondary | ICD-10-CM | POA: Diagnosis not present

## 2015-02-16 DIAGNOSIS — Z79899 Other long term (current) drug therapy: Secondary | ICD-10-CM | POA: Diagnosis not present

## 2015-02-16 DIAGNOSIS — E559 Vitamin D deficiency, unspecified: Secondary | ICD-10-CM | POA: Insufficient documentation

## 2015-02-16 DIAGNOSIS — J449 Chronic obstructive pulmonary disease, unspecified: Secondary | ICD-10-CM | POA: Diagnosis present

## 2015-02-16 HISTORY — DX: Anxiety disorder, unspecified: F41.9

## 2015-02-16 LAB — CBC
HCT: 39.2 % (ref 35.0–47.0)
Hemoglobin: 12.8 g/dL (ref 12.0–16.0)
MCH: 29.3 pg (ref 26.0–34.0)
MCHC: 32.6 g/dL (ref 32.0–36.0)
MCV: 89.7 fL (ref 80.0–100.0)
Platelets: 143 10*3/uL — ABNORMAL LOW (ref 150–440)
RBC: 4.37 MIL/uL (ref 3.80–5.20)
RDW: 14.3 % (ref 11.5–14.5)
WBC: 8.8 10*3/uL (ref 3.6–11.0)

## 2015-02-16 LAB — TROPONIN I: Troponin I: <0.03 ng/mL (ref <0.031)

## 2015-02-16 LAB — BASIC METABOLIC PANEL
Anion gap: 5 (ref 5–15)
BUN: 18 mg/dL (ref 6–20)
CHLORIDE: 106 mmol/L (ref 101–111)
CO2: 32 mmol/L (ref 22–32)
CREATININE: 0.86 mg/dL (ref 0.44–1.00)
Calcium: 8.7 mg/dL — ABNORMAL LOW (ref 8.9–10.3)
GFR calc non Af Amer: >60 mL/min (ref >60)
GLUCOSE: 108 mg/dL — AB (ref 65–99)
Potassium: 4.1 mmol/L (ref 3.5–5.1)
Sodium: 143 mmol/L (ref 135–145)

## 2015-02-16 MED ORDER — ALBUTEROL SULFATE (2.5 MG/3ML) 0.083% IN NEBU
2.5000 mg | INHALATION_SOLUTION | RESPIRATORY_TRACT | Status: DC
  Administered 2015-02-16 (×4): 2.5 mg via RESPIRATORY_TRACT
  Filled 2015-02-16 (×7): qty 3

## 2015-02-16 MED ORDER — DEXAMETHASONE SODIUM PHOSPHATE 10 MG/ML IJ SOLN
10.0000 mg | Freq: Three times a day (TID) | INTRAMUSCULAR | Status: DC
  Administered 2015-02-16 – 2015-02-17 (×4): 10 mg via INTRAVENOUS
  Filled 2015-02-16: qty 1
  Filled 2015-02-16 (×2): qty 2.5
  Filled 2015-02-16: qty 1
  Filled 2015-02-16 (×2): qty 2.5

## 2015-02-16 MED ORDER — DEXAMETHASONE SODIUM PHOSPHATE 10 MG/ML IJ SOLN
10.0000 mg | Freq: Once | INTRAMUSCULAR | Status: AC
  Administered 2015-02-16: 10 mg via INTRAVENOUS
  Filled 2015-02-16: qty 1

## 2015-02-16 MED ORDER — LOSARTAN POTASSIUM 50 MG PO TABS
100.0000 mg | ORAL_TABLET | Freq: Every day | ORAL | Status: DC
  Administered 2015-02-16 – 2015-02-18 (×3): 100 mg via ORAL
  Filled 2015-02-16 (×3): qty 2

## 2015-02-16 MED ORDER — CALCIUM CARBONATE-VITAMIN D 500-200 MG-UNIT PO TABS
1.0000 | ORAL_TABLET | Freq: Every day | ORAL | Status: DC
  Administered 2015-02-18: 1 via ORAL
  Filled 2015-02-16 (×2): qty 1

## 2015-02-16 MED ORDER — ALBUTEROL SULFATE (2.5 MG/3ML) 0.083% IN NEBU
2.5000 mg | INHALATION_SOLUTION | Freq: Once | RESPIRATORY_TRACT | Status: AC
  Administered 2015-02-16: 2.5 mg via RESPIRATORY_TRACT
  Filled 2015-02-16: qty 3

## 2015-02-16 MED ORDER — ALBUTEROL SULFATE (2.5 MG/3ML) 0.083% IN NEBU: 2.5000 mg | INHALATION_SOLUTION | Freq: Four times a day (QID) | RESPIRATORY_TRACT | Status: DC | PRN

## 2015-02-16 MED ORDER — ALBUTEROL SULFATE HFA 108 (90 BASE) MCG/ACT IN AERS: 2.0000 | INHALATION_SPRAY | Freq: Four times a day (QID) | RESPIRATORY_TRACT | Status: DC | PRN

## 2015-02-16 MED ORDER — HYDROCHLOROTHIAZIDE 25 MG PO TABS
25.0000 mg | ORAL_TABLET | Freq: Every day | ORAL | Status: DC
  Administered 2015-02-16: 25 mg via ORAL
  Filled 2015-02-16 (×2): qty 1

## 2015-02-16 MED ORDER — IPRATROPIUM-ALBUTEROL 0.5-2.5 (3) MG/3ML IN SOLN
3.0000 mL | Freq: Once | RESPIRATORY_TRACT | Status: AC
  Administered 2015-02-16: 3 mL via RESPIRATORY_TRACT
  Filled 2015-02-16: qty 3

## 2015-02-16 MED ORDER — LORATADINE 10 MG PO TABS
10.0000 mg | ORAL_TABLET | Freq: Every evening | ORAL | Status: DC
  Administered 2015-02-16 – 2015-02-17 (×2): 10 mg via ORAL
  Filled 2015-02-16 (×3): qty 1

## 2015-02-16 MED ORDER — SODIUM CHLORIDE 0.9 % IV SOLN: 250.0000 mL | INTRAVENOUS | Status: DC | PRN

## 2015-02-16 MED ORDER — SODIUM CHLORIDE 0.9 % IJ SOLN
3.0000 mL | Freq: Two times a day (BID) | INTRAMUSCULAR | Status: DC
Start: 2015-02-16 — End: 2015-02-18
  Administered 2015-02-16 – 2015-02-17 (×4): 3 mL via INTRAVENOUS

## 2015-02-16 MED ORDER — ONDANSETRON HCL 4 MG/2ML IJ SOLN
4.0000 mg | Freq: Four times a day (QID) | INTRAMUSCULAR | Status: DC | PRN
  Administered 2015-02-17 – 2015-02-18 (×3): 4 mg via INTRAVENOUS
  Filled 2015-02-16 (×3): qty 2

## 2015-02-16 MED ORDER — ACETAMINOPHEN 650 MG RE SUPP: 650.0000 mg | Freq: Four times a day (QID) | RECTAL | Status: DC | PRN

## 2015-02-16 MED ORDER — ACETAMINOPHEN 325 MG PO TABS
650.0000 mg | ORAL_TABLET | Freq: Four times a day (QID) | ORAL | Status: DC | PRN
  Filled 2015-02-16: qty 2

## 2015-02-16 MED ORDER — SENNOSIDES-DOCUSATE SODIUM 8.6-50 MG PO TABS: 1.0000 | ORAL_TABLET | Freq: Every evening | ORAL | Status: DC | PRN

## 2015-02-16 MED ORDER — BUDESONIDE-FORMOTEROL FUMARATE 80-4.5 MCG/ACT IN AERO
2.0000 | INHALATION_SPRAY | Freq: Two times a day (BID) | RESPIRATORY_TRACT | Status: DC
  Administered 2015-02-16 – 2015-02-18 (×5): 2 via RESPIRATORY_TRACT
  Filled 2015-02-16: qty 6.9

## 2015-02-16 MED ORDER — SODIUM CHLORIDE 0.9 % IJ SOLN: 3.0000 mL | INTRAMUSCULAR | Status: DC | PRN

## 2015-02-16 MED ORDER — ENOXAPARIN SODIUM 40 MG/0.4ML ~~LOC~~ SOLN
40.0000 mg | SUBCUTANEOUS | Status: DC
  Administered 2015-02-16 – 2015-02-17 (×2): 40 mg via SUBCUTANEOUS
  Filled 2015-02-16 (×2): qty 0.4

## 2015-02-16 MED ORDER — UMECLIDINIUM-VILANTEROL 62.5-25 MCG/INH IN AEPB: 1.0000 | INHALATION_SPRAY | Freq: Every day | RESPIRATORY_TRACT | Status: DC

## 2015-02-16 MED ORDER — MAGNESIUM SULFATE 2 GM/50ML IV SOLN
2.0000 g | Freq: Once | INTRAVENOUS | Status: AC
  Administered 2015-02-16: 2 g via INTRAVENOUS
  Filled 2015-02-16: qty 50

## 2015-02-16 MED ORDER — LEVOTHYROXINE SODIUM 112 MCG PO TABS
112.0000 ug | ORAL_TABLET | Freq: Every day | ORAL | Status: DC
  Administered 2015-02-16 – 2015-02-18 (×3): 112 ug via ORAL
  Filled 2015-02-16 (×4): qty 1

## 2015-02-16 MED ORDER — METHYLPREDNISOLONE SODIUM SUCC 125 MG IJ SOLR
125.0000 mg | Freq: Once | INTRAMUSCULAR | Status: DC
  Filled 2015-02-16: qty 2

## 2015-02-16 MED ORDER — ONDANSETRON HCL 4 MG PO TABS: 4.0000 mg | ORAL_TABLET | Freq: Four times a day (QID) | ORAL | Status: DC | PRN

## 2015-02-16 NOTE — Care Management (Signed)
Please order pulmonary rehab for this patient as outpatient. PT is recommending HHPT or OPPT and patient would like to go to [facility]. MD please write Rx for both and patient can take to her choice of agencies. No further RNCM needs.

## 2015-02-16 NOTE — Evaluation (Signed)
Physical Therapy Evaluation Patient Details Name: Lauren Bartlett MRN: [id number] DOB: 09/07/1955 Today's Date: 02/16/2015   History of Present Illness  Patient is a 59 y/o female admitted with COPD exacerbation.   Clinical Impression  Patient demonstrates appropriate tolerance for activity in this session, no shortness of breath or O2 desaturation noted with ambulation. She does require an increased assistive device during this session and does demonstrate decreased balance with heads turns and would benefit from a strengthening/increased activity tolerance program to prevent further hospitalizations. PT would recommend OP PT referral or pulmonary rehab, as available to the patient pending transportation needs. Pt agrees with above mentioned and expresses concern regarding breathing at night, educated to discuss with MD. Skilled PT services are indicated to address her mobility limitations described above.     Follow Up Recommendations Outpatient PT (Or pulmonary rehab)    Equipment Recommendations       Recommendations for Other Services       Precautions / Restrictions Precautions Precautions: None Restrictions Weight Bearing Restrictions: No      Mobility  Bed Mobility Overal bed mobility: Modified Independent             General bed mobility comments: Mod I with HOB elevated.   Transfers Overall transfer level: Modified independent Equipment used: Rolling walker (2 wheeled)             General transfer comment: No balance deficits noted and appropriate mechanics and speed noted with transfer.   Ambulation/Gait Ambulation/Gait assistance: Supervision Ambulation Distance (Feet): 225 Feet Assistive device: Rolling walker (2 wheeled) Gait Pattern/deviations: WFL(Within Functional Limits)   Gait velocity interpretation: Below normal speed for age/gender General Gait Details: Modified DGI of 10/12 with RW, no gait deficits readily noted, though balance deficits noted  with head turns.   Stairs            Wheelchair Mobility    Modified Rankin (Stroke Patients Only)       Balance Overall balance assessment: Needs assistance Sitting-balance support: No upper extremity supported Sitting balance-Leahy Scale: Normal     Standing balance support: Bilateral upper extremity supported Standing balance-Leahy Scale: Fair Standing balance comment: Modified DGI of 10/12                             Pertinent Vitals/Pain Pain Assessment: No/denies pain    Home Living Family/patient expects to be discharged to:: Private residence Living Arrangements: Alone   Type of Home: Apartment Home Access:  (A threshold)     Home Layout: One level Home Equipment: [place] - 4 wheels;Cane - single point      Prior Function Level of Independence: Independent with assistive device(s)         Comments: Pt reports no falls with either SPC or 4WW     Hand Dominance        Extremity/Trunk Assessment   Upper Extremity Assessment: Overall WFL for tasks assessed           Lower Extremity Assessment: Overall WFL for tasks assessed         Communication   Communication: No difficulties  Cognition Arousal/Alertness: Awake/alert Behavior During Therapy: WFL for tasks assessed/performed Overall Cognitive Status: Within Functional Limits for tasks assessed                      General Comments      Exercises        Assessment/Plan  PT Assessment Patient needs continued PT services  PT Diagnosis Generalized weakness   PT Problem List Decreased activity tolerance;Decreased balance;Decreased mobility;Decreased strength  PT Treatment Interventions DME instruction;Gait training;Stair training;Therapeutic exercise;Therapeutic activities;Balance training   PT Goals (Current goals can be found in the Care Plan section) Acute Rehab PT Goals Patient Stated Goal: To return home.  PT Goal Formulation: With patient Time For  Goal Achievement: 03/02/15 Potential to Achieve Goals: Good    Frequency Min 2X/week   Barriers to discharge Decreased caregiver support Patient lives alone, she voices concern regarding shortness of breath at night, advised to discuss with MD.     Co-evaluation               End of Session Equipment Utilized During Treatment: Gait belt Activity Tolerance: Patient tolerated treatment well Patient left: in chair;with call bell/phone within reach;with chair alarm set Nurse Communication: Mobility status    Functional Assessment Tool Used: Modified DGI, Clinical judgement  Functional Limitation: Mobility: Walking and moving around Mobility: Walking and Moving Around Current Status [phone]): At least 20 percent but less than 40 percent impaired, limited or restricted Mobility: Walking and Moving Around Goal Status [phone]): At least 20 percent but less than 40 percent impaired, limited or restricted    Time: 1351-1406 PT Time Calculation (min) (ACUTE ONLY): 15 min   Charges:   PT Evaluation $Initial PT Evaluation Tier I: 1 Procedure     PT G Codes:   PT G-Codes **NOT FOR INPATIENT CLASS** Functional Assessment Tool Used: Modified DGI, Clinical judgement  Functional Limitation: Mobility: Walking and moving around Mobility: Walking and Moving Around Current Status [id number]): At least 20 percent but less than 40 percent impaired, limited or restricted Mobility: Walking and Moving Around Goal Status [phone]): At least 20 percent but less than 40 percent impaired, limited or restricted    Kerman Passey, PT, DPT    02/16/2015, 4:48 PM

## 2015-02-16 NOTE — Discharge Instructions (Signed)
We believe that your symptoms are caused today by an exacerbation of your COPD, and possibly bronchitis.  Please take the prescribed medications and any medications that you have at home for your COPD.  Follow up with your doctor as recommended.  If you develop any new or worsening symptoms, including but not limited to fever, persistent vomiting, worsening shortness of breath, or other symptoms that concern you, please return to the Emergency Department immediately. ° ° °Chronic Obstructive Pulmonary Disease °Chronic obstructive pulmonary disease (COPD) is a common lung condition in which airflow from the lungs is limited. COPD is a general term that can be used to describe many different lung problems that limit airflow, including both chronic bronchitis and emphysema.  If you have COPD, your lung function will probably never return to normal, but there are measures you can take to improve lung function and make yourself feel better.  °CAUSES  °· Smoking (common).   °· Exposure to secondhand smoke.   °· Genetic problems. °· Chronic inflammatory lung diseases or recurrent infections. °SYMPTOMS  °· Shortness of breath, especially with physical activity.   °· Deep, persistent (chronic) cough with a large amount of thick mucus.   °· Wheezing.   °· Rapid breaths (tachypnea).   °· Gray or bluish discoloration (cyanosis) of the skin, especially in fingers, toes, or lips.   °· Fatigue.   °· Weight loss.   °· Frequent infections or episodes when breathing symptoms become much worse (exacerbations).   °· Chest tightness. °DIAGNOSIS  °Your health care provider will take a medical history and perform a physical examination to make the initial diagnosis.  Additional tests for COPD may include:  °· Lung (pulmonary) function tests. °· Chest X-ray. °· CT scan. °· Blood tests. °TREATMENT  °Treatment available to help you feel better when you have COPD includes:  °· Inhaler and nebulizer medicines. These help manage the symptoms of  COPD and make your breathing more comfortable. °· Supplemental oxygen. Supplemental oxygen is only helpful if you have a low oxygen level in your blood.   °· Exercise and physical activity. These are beneficial for nearly all people with COPD. Some people may also benefit from a pulmonary rehabilitation program. °HOME CARE INSTRUCTIONS  °· Take all medicines (inhaled or pills) as directed by your health care provider. °· Avoid over-the-counter medicines or cough syrups that dry up your airway (such as antihistamines) and slow down the elimination of secretions unless instructed otherwise by your health care provider.   °· If you are a smoker, the most important thing that you can do is stop smoking. Continuing to smoke will cause further lung damage and breathing trouble. Ask your health care provider for help with quitting smoking. He or she can direct you to community resources or hospitals that provide support. °· Avoid exposure to irritants such as smoke, chemicals, and fumes that aggravate your breathing. °· Use oxygen therapy and pulmonary rehabilitation if directed by your health care provider. If you require home oxygen therapy, ask your health care provider whether you should purchase a pulse oximeter to measure your oxygen level at home.   °· Avoid contact with individuals who have a contagious illness. °· Avoid extreme temperature and humidity changes. °· Eat healthy foods. Eating smaller, more frequent meals and resting before meals may help you maintain your strength. °· Stay active, but balance activity with periods of rest. Exercise and physical activity will help you maintain your ability to do things you want to do. °· Preventing infection and hospitalization is very important when you have COPD. Make sure   to receive all the vaccines your health care provider recommends, especially the pneumococcal and influenza vaccines. Ask your health care provider whether you need a pneumonia vaccine. °· Learn  and use relaxation techniques to manage stress. °· Learn and use controlled breathing techniques as directed by your health care provider. Controlled breathing techniques include:   °¨ Pursed lip breathing. Start by breathing in (inhaling) through your nose for 1 second. Then, purse your lips as if you were going to whistle and breathe out (exhale) through the pursed lips for 2 seconds.   °¨ Diaphragmatic breathing. Start by putting one hand on your abdomen just above your waist. Inhale slowly through your nose. The hand on your abdomen should move out. Then purse your lips and exhale slowly. You should be able to feel the hand on your abdomen moving in as you exhale.   °· Learn and use controlled coughing to clear mucus from your lungs. Controlled coughing is a series of short, progressive coughs. The steps of controlled coughing are:   °1. Lean your head slightly forward.   °2. Breathe in deeply using diaphragmatic breathing.   °3. Try to hold your breath for 3 seconds.   °4. Keep your mouth slightly open while coughing twice.   °5. Spit any mucus out into a tissue.   °6. Rest and repeat the steps once or twice as needed. °SEEK MEDICAL CARE IF:  °· You are coughing up more mucus than usual.   °· There is a change in the color or thickness of your mucus.   °· Your breathing is more labored than usual.   °· Your breathing is faster than usual.   °SEEK IMMEDIATE MEDICAL CARE IF:  °· You have shortness of breath while you are resting.   °· You have shortness of breath that prevents you from: °¨ Being able to talk.   °¨ Performing your usual physical activities.   °· You have chest pain lasting longer than 5 minutes.   °· Your skin color is more cyanotic than usual. °· You measure low oxygen saturations for longer than 5 minutes with a pulse oximeter. °MAKE SURE YOU:  °· Understand these instructions. °· Will watch your condition. °· Will get help right away if you are not doing well or get worse. °Document Released:  12/25/2004 Document Revised: 08/01/2013 Document Reviewed: 11/11/2012 °[employer]® Patient Information ©2015 [employer]. This information is not intended to replace advice given to you by your health care provider. Make sure you discuss any questions you have with your health care provider. ° ° ° °

## 2015-02-16 NOTE — Progress Notes (Signed)
Spoke with Dr. Fritzi Mandes and order given for physical therapy consult. Patient ambulates with a cane

## 2015-02-16 NOTE — Care Management Obs Status (Signed)
[place] NOTIFICATION   Patient Details  Name: Lauren Bartlett MRN: [id number] Date of Birth: 03-11-1956   Medicare Observation Status Notification Given:  Yes Notice Given to patient in ER room 19.    Beau Fanny, RN 02/16/2015, 8:48 AM

## 2015-02-16 NOTE — ED Provider Notes (Signed)
[facility] Emergency Department Provider Note  ____________________________________________  Time seen: Approximately 4:46 AM  I have reviewed the triage vital signs and the nursing notes.   HISTORY  Chief Complaint Shortness of Breath and Weakness    HPI Lauren Bartlett is a 59 y.o. female with a history of COPD who uses oxygen at night and has frequent visits for exacerbations and he also recently completed a course of antibiotics for bronchitis presents by EMS for shortness of breath.  She reports that after her most recent hospitalization she has had some gradually increasing shortness of breath but it got significantly worse tonight.  She uses inhalers/nebulizers at home but they did not help significantly.  Because she lives alone and does not have anyone to help her she was worried and called EMS.  They gave her a breathing treatment prior to arrival and it did seem to help a little bit though she is still mildly wheezing upon arrival.  She denies chest pain, fever/chills, abdominal pain.  She has some nausea but no vomiting.  She states it does feel similar to her prior COPD exacerbations.   Past Medical History  Diagnosis Date  . Congenital absence of one kidney   . History of sciatica   . Depression     treated at [facility]  . Hypertension   . hypothyrodism   . COPD (chronic obstructive pulmonary disease) [facility]     Patient Active Problem List   Diagnosis Date Noted  . COPD exacerbation ([place]) 02/03/2015  . Vitamin D deficiency 01/11/2015  . S/P hysterectomy with oophorectomy 09/22/2013  . Pulmonary hypertension ([place]) 07/14/2013  . Edema 01/12/2013  . Hypothyroidism 05/04/2012  . Severe mitral regurgitation by prior echocardiogram 01/11/2012  . Obesity (BMI 30-39.9) 08/04/2011  . Depression   . Hypertension   . Congenital absence of one kidney   . History of sciatica     Past Surgical History  Procedure Laterality Date  . Combined  hysterectomy abdominal w/ a&p repair / oophorectomy  1996    benign tumor  . Inner ear surgery      bilateral  . Abdominal hysterectomy    . Breast biopsy Right 2011    [facility]< benign    Current Outpatient Rx  Name  Route  Sig  Dispense  Refill  . albuterol (PROVENTIL HFA;VENTOLIN HFA) 108 (90 BASE) MCG/ACT inhaler   Inhalation   Inhale 2 puffs into the lungs every 6 (six) hours as needed for wheezing or shortness of breath. WITH A SPACER   1 Inhaler   0   . calcium-vitamin D (OSCAL WITH D) 500-200 MG-UNIT per tablet   Oral   Take 1 tablet by mouth daily with breakfast.   90 tablet   2   . cefUROXime (CEFTIN) 500 MG tablet   Oral   Take 1 tablet (500 mg total) by mouth 2 (two) times daily with a meal.   8 tablet   0   . ergocalciferol (DRISDOL) 50000 UNITS capsule   Oral   Take 1 capsule (50,000 Units total) by mouth once a week.   4 capsule   0   . ergocalciferol (DRISDOL) 50000 UNITS capsule   Oral   Take 1 capsule (50,000 Units total) by mouth once a week.   12 capsule   0   . guaifenesin (ROBITUSSIN CHEST CONGESTION) 100 MG/5ML syrup   Oral   Take 10 mLs (200 mg total) by mouth 3 (three) times daily as needed  for cough.   120 mL   0   . hydrochlorothiazide (HYDRODIURIL) 25 MG tablet   Oral   Take 1 tablet (25 mg total) by mouth daily.   30 tablet   5   . levocetirizine (XYZAL) 5 MG tablet   Oral   Take 1 tablet (5 mg total) by mouth every evening.   30 tablet   5   . levothyroxine (SYNTHROID, LEVOTHROID) 112 MCG tablet      TAKE 1 TABLET BY MOUTH EVERY DAY   30 tablet   9   . losartan (COZAAR) 100 MG tablet      TAKE 1 TABLET BY MOUTH DAILY   30 tablet   5   . Spacer/Aero Chamber Mouthpiece MISC   Does not apply   1 applicator by Does not apply route once.   1 each   0     Patient needs spacer and mouthpiece   . Umeclidinium-Vilanterol (ANORO ELLIPTA) 62.5-25 MCG/INH AEPB   Inhalation   Inhale 1 puff into the lungs daily.            Allergies Codeine; Penicillin g; and Prednisone  Family History  Problem Relation Age of Onset  . Multiple sclerosis Mother   . Coronary artery disease Father   . Heart disease Brother   . Breast cancer Paternal Aunt     Social History Social History  Substance Use Topics  . Smoking status: Never Smoker   . Smokeless tobacco: Never Used  . Alcohol Use: No    Review of Systems Constitutional: No fever/chills Eyes: No visual changes. ENT: No sore throat. Cardiovascular: Denies chest pain. Respiratory: Worsening shortness of breath and wheezing over the last week with acute worsening tonight Gastrointestinal: No abdominal pain.  nausea, no vomiting.  No diarrhea.  No constipation. Genitourinary: Negative for dysuria. Musculoskeletal: Negative for back pain. Skin: Negative for rash. Neurological: Negative for headaches, focal weakness or numbness.  10-point ROS otherwise negative.  ____________________________________________   PHYSICAL EXAM:  VITAL SIGNS: ED Triage Vitals  Enc Vitals Group     BP 02/16/15 0411 150/57 mmHg     Pulse Rate 02/16/15 0411 99     Resp 02/16/15 0411 24     Temp 02/16/15 0411 98.2 F (36.8 C)     Temp Source 02/16/15 0411 Oral     SpO2 02/16/15 0411 99 %     Weight 02/16/15 0411 200 lb (90.719 kg)     Height 02/16/15 0411 5\' 2"  (1.575 m)     Head Cir --      Peak Flow --      Pain Score 02/16/15 0414 0     Pain Loc --      Pain Edu? --      Excl. in [place]? --     Constitutional: Alert and oriented. Well appearing with mild respiratory distress and some anxiousness Eyes: Conjunctivae are normal. PERRL. EOMI. Head: Atraumatic. Nose: No congestion/rhinnorhea. Mouth/Throat: Mucous membranes are moist.  Oropharynx non-erythematous. Neck: No stridor.   Cardiovascular: Normal rate, regular rhythm. Grossly normal heart sounds.  Good peripheral circulation. Respiratory: Slightly increased respiratory effort with mild expiratory wheezes  and prolonged expiratory phase throughout lung fields. Gastrointestinal: Soft and nontender. No distention. No abdominal bruits. No CVA tenderness. Musculoskeletal: No lower extremity tenderness nor edema.  No joint effusions. Neurologic:  Normal speech and language. No gross focal neurologic deficits are appreciated.  Skin:  Skin is warm, dry and intact. No rash noted.  ____________________________________________   LABS (all labs ordered are listed, but only abnormal results are displayed)  Labs Reviewed  BASIC METABOLIC PANEL - Abnormal; Notable for the following:    Glucose, Bld 108 (*)    Calcium 8.7 (*)    All other components within normal limits  CBC - Abnormal; Notable for the following:    Platelets 143 (*)    All other components within normal limits  TROPONIN I   ____________________________________________  EKG  ED ECG REPORT I, Burdell Peed, the attending physician, personally viewed and interpreted this ECG.  Date: 02/16/2015 EKG Time: 4:17 Rate: 84 Rhythm: normal sinus rhythm QRS Axis: normal Intervals: normal ST/T Wave abnormalities: normal Conduction Disutrbances: none Narrative Interpretation: unremarkable  ____________________________________________  RADIOLOGY   No results found.  ____________________________________________   PROCEDURES  Procedure(s) performed: None  Critical Care performed: No ____________________________________________   INITIAL IMPRESSION / ASSESSMENT AND PLAN / ED COURSE  Pertinent labs & imaging results that were available during my care of the patient were reviewed by me and considered in my medical decision making (see chart for details).  Mild wheezing, reassuring O2 sats, clearly anxious due to dyspnea.  Standard workup for probable COPD.  Patient reports prednisone allergy which may limit compliance as outpatient - instead of solumedrol and prednisone Rx, will give one-time dose of Decadron 10 mg IV.   Giving another 2 Duonebs as well (received one by EMS).  ----------------------------------------- 5:44 AM on 02/16/2015 -----------------------------------------  Patient still having wheezing, and after her breathing treatments it actually sounds as if the wheezing has increased slightly.  This may be the result of airway improvement rather than decline.  Upon patient's oxygen saturation remains acceptable.  I will give him 2 additional nebulizer treatments of albuterol since she is reached the therapeutic goal of 3 doses of ipratropium.  She has also not had enough time for the Decadron to start working but it should be started work soon.  I will also give magnesium 2 g IV given the persistent and refractory symptoms.  ----------------------------------------- 6:54 AM on 02/16/2015 -----------------------------------------  Patient's breathing has improved significantly though she still has wheezing particularly in the right lower lobe.  I discussed the possibility of going home but she is very concerned based on her past experience and recent exacerbations that the severe dyspnea is going to return and that she left come right back.  We will attempt a walking pulse ox test to determine if she becomes hypoxemic or significant dyspneic or if the wheezing returns while ambulating.  Transferring ED care to Dr. Burlene Arnt to reassess and determine dispo, but patient has received maximal therapy and is still weak with mild wheezing - anticipate admission for q2hr nebs and reassessment.  ____________________________________________  FINAL CLINICAL IMPRESSION(S) / ED DIAGNOSES  Final diagnoses:  COPD with acute exacerbation ([place])      NEW MEDICATIONS STARTED DURING THIS VISIT:  New Prescriptions   No medications on file     Hinda Kehr, MD 02/16/15 [phone]

## 2015-02-16 NOTE — ED Notes (Signed)
Pt to ED via EMS c/o ShOB and weakness. Pt reports recently taking antibiotics for bronchitis, finished Rx 1 week ago. Pt reports tightness and wheezing, EMS provided 1 Duoneb in the field.

## 2015-02-16 NOTE — ED Notes (Signed)
Pt ambulated from rm 19 around the nursing station and back to room.the patient HR 114-121, RR from 21-33, O2 sats 93 resting down to 89% with ambulation.[name]

## 2015-02-16 NOTE — H&P (Signed)
[facility] NAME: Lauren Bartlett    MR#:  [id number]  DATE OF BIRTH:  03/26/1956  DATE OF ADMISSION:  02/16/2015  PRIMARY CARE PHYSICIAN: [name], MD   REQUESTING/REFERRING PHYSICIAN: Dr Burlene Arnt  CHIEF COMPLAINT:  Shortness of breath for 2 \\days   HISTORY OF PRESENT ILLNESS:  Lauren Bartlett  is a 59 y.o. female with a known history of COPD he uses oxygen at nighttime, hypertension, anxiety, depression comes to the emergency room with increasing shortness of breath for last 2 days. Patient received several rounds of nebulizer treatment received some IV Decadron. Patient was feeling better however when ER tech try to ambulate her her sats dropped down into the upper 80s. She became tachypneic and hence internal medicine was called for further evaluation and management for COPD exacerbation. Patient was here last week with chronic bronchitis she just finished her course of antibiotic. Her chest x-ray looks okay. White count is normal. She denies any fever.  PAST MEDICAL HISTORY:   Past Medical History  Diagnosis Date  . Congenital absence of one kidney   . History of sciatica   . Depression     treated at [facility]  . Hypertension   . hypothyrodism   . COPD (chronic obstructive pulmonary disease) ([place])     PAST SURGICAL HISTOIRY:   Past Surgical History  Procedure Laterality Date  . Combined hysterectomy abdominal w/ a&p repair / oophorectomy  1996    benign tumor  . Inner ear surgery      bilateral  . Abdominal hysterectomy    . Breast biopsy Right 2011    [facility]< benign    SOCIAL HISTORY:   Social History  Substance Use Topics  . Smoking status: Never Smoker   . Smokeless tobacco: Never Used  . Alcohol Use: No    FAMILY HISTORY:   Family History  Problem Relation Age of Onset  . Multiple sclerosis Mother   . Coronary artery disease Father   . Heart disease Brother   . Breast cancer Paternal Aunt      DRUG ALLERGIES:   Allergies  Allergen Reactions  . Codeine Hives and Nausea And Vomiting  . Penicillin G Itching  . Prednisone Palpitations    REVIEW OF SYSTEMS:  Review of Systems  Constitutional: Negative for fever, chills and weight loss.  HENT: Negative for ear discharge, ear pain and nosebleeds.   Eyes: Negative for blurred vision, pain and discharge.  Respiratory: Positive for cough and shortness of breath. Negative for sputum production, wheezing and stridor.   Cardiovascular: Negative for chest pain, palpitations, orthopnea and PND.  Gastrointestinal: Negative for nausea, vomiting, abdominal pain and diarrhea.  Genitourinary: Negative for urgency and frequency.  Musculoskeletal: Negative for back pain and joint pain.  Neurological: Negative for sensory change, speech change, focal weakness and weakness.  Psychiatric/Behavioral: Negative for depression and hallucinations. The patient is not nervous/anxious.   All other systems reviewed and are negative.    MEDICATIONS AT HOME:   Prior to Admission medications   Medication Sig Start Date End Date Taking? Authorizing Provider  albuterol (PROVENTIL HFA;VENTOLIN HFA) 108 (90 BASE) MCG/ACT inhaler Inhale 2 puffs into the lungs every 6 (six) hours as needed for wheezing or shortness of breath. WITH A SPACER 03/09/13  Yes [name], MD  calcium-vitamin D (OSCAL WITH D) 500-200 MG-UNIT per tablet Take 1 tablet by mouth daily with breakfast. 07/14/13  Yes [name],  MD  ergocalciferol (DRISDOL) 50000 UNITS capsule Take 1 capsule (50,000 Units total) by mouth once a week. 07/10/14  Yes [name], MD  hydrochlorothiazide (HYDRODIURIL) 25 MG tablet Take 1 tablet (25 mg total) by mouth daily. 09/28/14  Yes [name], MD  levocetirizine (XYZAL) 5 MG tablet Take 1 tablet (5 mg total) by mouth every evening. 07/07/14  Yes [name], MD  levothyroxine (SYNTHROID, LEVOTHROID) 112 MCG tablet TAKE 1 TABLET BY MOUTH  EVERY DAY 09/26/14  Yes [name], MD  losartan (COZAAR) 100 MG tablet TAKE 1 TABLET BY MOUTH DAILY 09/28/14  Yes [name], MD  Umeclidinium-Vilanterol (ANORO ELLIPTA) 62.5-25 MCG/INH AEPB Inhale 1 puff into the lungs daily.   Yes Historical Provider, MD  cefUROXime (CEFTIN) 500 MG tablet Take 1 tablet (500 mg total) by mouth 2 (two) times daily with a meal. Patient not taking: Reported on 02/16/2015 02/05/15   Dustin Flock, MD  ergocalciferol (DRISDOL) 50000 UNITS capsule Take 1 capsule (50,000 Units total) by mouth once a week. Patient not taking: Reported on 02/16/2015 01/11/15   [name], MD  guaifenesin (ROBITUSSIN CHEST CONGESTION) 100 MG/5ML syrup Take 10 mLs (200 mg total) by mouth 3 (three) times daily as needed for cough. Patient not taking: Reported on 02/16/2015 02/05/15   Dustin Flock, MD      VITAL SIGNS:  Blood pressure 136/65, pulse 99, temperature 97.6 F (36.4 C), temperature source Oral, resp. rate 18, height 5\' 2"  (1.575 m), weight 90.719 kg (200 lb), SpO2 94 %.  PHYSICAL EXAMINATION:  GENERAL:  59 y.o.-year-old patient lying in the bed with no acute distress.  EYES: Pupils equal, round, reactive to light and accommodation. No scleral icterus. Extraocular muscles intact.  HEENT: Head atraumatic, normocephalic. Oropharynx and nasopharynx clear.  NECK:  Supple, no jugular venous distention. No thyroid enlargement, no tenderness.  LUNGS: Normal breath sounds bilaterally, no wheezing, rales,rhonchi or crepitation. No use of accessory muscles of respiration.  CARDIOVASCULAR: S1, S2 normal. No murmurs, rubs, or gallops.  ABDOMEN: Soft, nontender, nondistended. Bowel sounds present. No organomegaly or mass.  EXTREMITIES: No pedal edema, cyanosis, or clubbing.  NEUROLOGIC: Cranial nerves II through XII are intact. Muscle strength 5/5 in all extremities. Sensation intact. Gait not checked.  PSYCHIATRIC: The patient is alert and oriented x 3.  SKIN: No obvious  rash, lesion, or ulcer.   LABORATORY PANEL:   CBC  Recent Labs Lab 02/16/15 0418  WBC 8.8  HGB 12.8  HCT 39.2  PLT 143*   ------------------------------------------------------------------------------------------------------------------  Chemistries   Recent Labs Lab 02/16/15 0418  NA 143  K 4.1  CL 106  CO2 32  GLUCOSE 108*  BUN 18  CREATININE 0.86  CALCIUM 8.7*   ------------------------------------------------------------------------------------------------------------------  Cardiac Enzymes  Recent Labs Lab 02/16/15 0418  TROPONINI <0.03   ------------------------------------------------------------------------------------------------------------------  RADIOLOGY:  Dg Chest 2 View  02/16/2015  CLINICAL DATA:  Shortness of breath and wheezing for 2 weeks. EXAM: CHEST  2 VIEW COMPARISON:  02/03/2015 FINDINGS: The cardiomediastinal contours are unchanged, heart at the upper limits of normal in size Pulmonary vasculature is normal. No consolidation, pleural effusion, or pneumothorax. No acute osseous abnormalities are seen. IMPRESSION: No acute process or change from prior exam. Electronically Signed   By: Jeb Levering M.D.   On: 02/16/2015 05:08   IMPRESSION AND PLAN:   Ms. Scordato is a 59 year old Caucasian female with past medical history of COPD on home oxygen nighttime comes to emergency room with  shortness of breath and postnasal drip with cough. She was found to be hypoxic in the emergency room on ambulation she is being admitted with  1. Hypoxia with COPD exacerbation mild.  Sats were 89% on room air on ambulation. Will ambulate patient around and see if she qualifies for home oxygen use during daytime. She is on chronic home oxygen nighttime. IV Decadron 10 mg 3 times a day changed to by mouth at discharge if needed. Currently patient is not wheezing. No indications for antibiotics at present. She just finished a round of antibiotic last week. White  count is normal Continue nebulizers and inhalers  2. Hypertension Continue hydrochlorothiazide and losartan  3. Postnasal drip with episodes of coughing at nighttime Continue Zyrtec  4. DVT prophylaxis subcutaneous Lovenox.    All the records are reviewed and case discussed with ED provider. Management plans discussed with the patient, family and they are in agreement.  CODE STATUS: full  TOTAL TIME TAKING CARE OF THIS PATIENT: 45 minutes.    Deanette Tullius M.D on 02/16/2015 at 9:53 AM  Between 7am to 6pm - Pager - [phone]  After 6pm go to [email or web address] - password [email or web address] [facility]  Office  [phone]  CC: Primary care physician; [name], MD

## 2015-02-16 NOTE — Care Management Note (Signed)
Case Management Note  Patient Details  Name: Lauren Bartlett MRN: [id number] Date of Birth: 06/09/1955  Subjective/Objective:                  Met with patient who plans to return home to independent living at discharge. She would like to use [facility]. She is on O2 at night through [place]. She is not requiring O2 currently. She states she drives (just drove yesterday) but has become increasingly weaker. She lives alone in an apartment that is handicap accessible. She has a 4-wheeled walker she depends on at baseline. She is interested in ALF but not ready to decide at this time but agrees with home health.  Action/Plan: List of home health agencies left with patient. Referral made to [facility] with [place]. RNCM will continue to follow.   Expected Discharge Date:  02/17/15               Expected Discharge Plan:     In-House Referral:     Discharge planning Services  CM Consult  Post Acute Care Choice:  Home Health Choice offered to:  Patient  DME Arranged:    DME Agency:     HH Arranged:  PT [place]:  [place]  Status of Service:  In process, will continue to follow  Medicare Important Message Given:    Date Medicare IM Given:    Medicare IM give by:    Date Additional Medicare IM Given:    Additional Medicare Important Message give by:     If discussed at [place] of Stay Meetings, dates discussed:    Additional Comments:  Marshell Garfinkel, RN 02/16/2015, 1:12 PM

## 2015-02-17 DIAGNOSIS — J441 Chronic obstructive pulmonary disease with (acute) exacerbation: Secondary | ICD-10-CM | POA: Diagnosis not present

## 2015-02-17 NOTE — Progress Notes (Signed)
[facility] [place] NAME: Lauren Bartlett    MR#:  [id number]  DATE OF BIRTH:  [birth year]  SUBJECTIVE:  CHIEF COMPLAINT:   Chief Complaint  Patient presents with  . Shortness of Breath  . Weakness   Feels very sick on her stomach. Unable to eat due to nausea. Has not vomited. Shortness of breath is resolved  REVIEW OF SYSTEMS:   Review of Systems  Constitutional: Negative for fever.  Respiratory: Negative for shortness of breath.   Cardiovascular: Negative for chest pain and palpitations.  Gastrointestinal: Positive for nausea. Negative for vomiting and abdominal pain.  Genitourinary: Negative for dysuria.    DRUG ALLERGIES:   Allergies  Allergen Reactions  . Codeine Hives and Nausea And Vomiting  . Penicillin G Itching  . Prednisone Palpitations    VITALS:  Blood pressure 155/70, pulse 86, temperature 97.5 F (36.4 C), temperature source Oral, resp. rate 18, height 5\' 2"  (1.575 m), weight 90.719 kg (200 lb), SpO2 95 %.  PHYSICAL EXAMINATION:  GENERAL:  59 y.o.-year-old patient lying in the bed with no acute distress. Uncomfortable appearing  LUNGS: Normal breath sounds bilaterally, no wheezing, rales,rhonchi or crepitation. No use of accessory muscles of respiration.  CARDIOVASCULAR: S1, S2 normal. No murmurs, rubs, or gallops.  ABDOMEN: Soft, nontender, nondistended. Bowel sounds present. No organomegaly or mass. No guarding no rebound EXTREMITIES: No pedal edema, cyanosis, or clubbing.  NEUROLOGIC: Cranial nerves II through XII are intact. Muscle strength 5/5 in all extremities. Sensation intact. Gait not checked.  PSYCHIATRIC: The patient is alert and oriented x 3. Flat affect SKIN: No obvious rash, lesion, or ulcer.    LABORATORY PANEL:   CBC  Recent Labs Lab 02/16/15 0418  WBC 8.8  HGB 12.8  HCT 39.2  PLT 143*    ------------------------------------------------------------------------------------------------------------------  Chemistries   Recent Labs Lab 02/16/15 0418  NA 143  K 4.1  CL 106  CO2 32  GLUCOSE 108*  BUN 18  CREATININE 0.86  CALCIUM 8.7*   ------------------------------------------------------------------------------------------------------------------  Cardiac Enzymes  Recent Labs Lab 02/16/15 0418  TROPONINI <0.03   ------------------------------------------------------------------------------------------------------------------  RADIOLOGY:  Dg Chest 2 View  02/16/2015  CLINICAL DATA:  Shortness of breath and wheezing for 2 weeks. EXAM: CHEST  2 VIEW COMPARISON:  02/03/2015 FINDINGS: The cardiomediastinal contours are unchanged, heart at the upper limits of normal in size Pulmonary vasculature is normal. No consolidation, pleural effusion, or pneumothorax. No acute osseous abnormalities are seen. IMPRESSION: No acute process or change from prior exam. Electronically Signed   By: Jeb Levering M.D.   On: 02/16/2015 05:08    EKG:   Orders placed or performed during the hospital encounter of 02/16/15  . ED EKG  . ED EKG  . EKG 12-Lead  . EKG 12-Lead    ASSESSMENT AND PLAN:    1. Hypoxia with COPD exacerbation mild.  Sats were 89% on room air on ambulation on admission. Now in the mid 90s on room air. IContinue nebulizers and inhalers  2. Hypertension Continue hydrochlorothiazide and losartan  3. Postnasal drip with episodes of coughing at nighttime Continue Zyrtec  4. DVT prophylaxis subcutaneous Lovenox.  5 nausea: This is likely due to high-dose steroids. These been stopped. Respiratory complaint is resolved. Continue with as needed Zofran. Cannot discharge until she is comfortable taking by mouth.  All the records are reviewed and case discussed with Care Management/Social Workerr. Management plans discussed with the patient, family and they  are in agreement.  CODE STATUS: Full  TOTAL TIME TAKING CARE OF THIS PATIENT: 20 minutes.  Greater than 50% of time spent in care coordination and counseling. POSSIBLE D/C IN 1 DAYS, DEPENDING ON CLINICAL CONDITION.   Myrtis Ser M.D on 02/17/2015 at 3:34 PM  Between 7am to 6pm - Pager - [phone]  After 6pm go to [email or web address] - password [email or web address] [facility]  Office  [phone]  CC: Primary care physician; [name], MD

## 2015-02-18 DIAGNOSIS — J441 Chronic obstructive pulmonary disease with (acute) exacerbation: Secondary | ICD-10-CM | POA: Diagnosis not present

## 2015-02-18 NOTE — Care Management Note (Addendum)
Case Management Note  Patient Details  Name: Lauren Bartlett MRN: [id number] Date of Birth: 03-28-1956  Subjective/Objective:     Nurse Herbert Seta was able to find and print out a copy of Dr Loistine Chance outpatient PT order for Lauren Bartlett which was buried in [facility] under [place] Discharge orders. No other home health orders. Lauren Bartlett has a rolling walker at home. Lauren Hallock will take her copy of the order to her choice of outpatient PT providers, the [facility], or to her PCP to set up an appointment for outpatient PT. [name]                Action/Plan:   Expected Discharge Date:  02/17/15               Expected Discharge Plan:     In-House Referral:     Discharge planning Services  CM Consult  Post Acute Care Choice:  Home Health Choice offered to:  Patient  DME Arranged:    DME Agency:     HH Arranged:  PT [place]:  [place]  Status of Service:  In process, will continue to follow  Medicare Important Message Given:  Yes Date Medicare IM Given:    Medicare IM give by:    Date Additional Medicare IM Given:    Additional Medicare Important Message give by:     If discussed at [place] of Stay Meetings, dates discussed:    Additional Comments:  Aviannah Castoro A, RN 02/18/2015, 12:35 PM

## 2015-02-18 NOTE — Progress Notes (Signed)
Physical Therapy Treatment Patient Details Name: Lauren Bartlett MRN: [id number] DOB: November 12, 1955 Today's Date: 02/18/2015    History of Present Illness Patient is a 59 y/o female admitted with COPD exacerbation.     PT Comments    Pt only needing directional and general fine tuning gait cues.  She has no fatigue with no safety issues during 600 ft of ambulation with PT not needing to touch pt t/o session.  Pt is safe to go home.  Follow Up Recommendations  Outpatient PT (or pulmonary rehab)     Equipment Recommendations       Recommendations for Other Services       Precautions / Restrictions Restrictions Weight Bearing Restrictions: No    Mobility  Bed Mobility                  Transfers Overall transfer level: Independent Equipment used: Straight cane (Pt able to stand up before PT even got within reach)             General transfer comment: Pt slow and deliberate, but has no safety concerns  Ambulation/Gait Ambulation/Gait assistance: Independent Ambulation Distance (Feet): 600 Feet Assistive device: Straight cane;Rolling walker (2 wheeled) (200 ft with walker and ~400 with SPC)       General Gait Details: Pt walks slowly, but consistently with no LOBs or safety concerns and she reports that she feels like she is at her normal baseline.    Stairs            Wheelchair Mobility    Modified Rankin (Stroke Patients Only)       Balance                                    Cognition Arousal/Alertness: Awake/alert Behavior During Therapy: WFL for tasks assessed/performed Overall Cognitive Status: Within Functional Limits for tasks assessed                      Exercises      General Comments        Pertinent Vitals/Pain Pain Assessment: No/denies pain    Home Living                      Prior Function            PT Goals (current goals can now be found in the care plan section) Progress towards PT  goals: Progressing toward goals    Frequency  Min 2X/week    PT Plan Current plan remains appropriate    Co-evaluation             End of Session Equipment Utilized During Treatment: Gait belt Activity Tolerance: Patient tolerated treatment well Patient left: in chair;with call bell/phone within reach;with chair alarm set     Time: [id number] PT Time Calculation (min) (ACUTE ONLY): 15 min  Charges:  $Gait Training: 8-22 mins                    G Codes:     Wayne Both, PT, DPT [phone]  Kreg Shropshire 02/18/2015, 2:47 PM

## 2015-02-18 NOTE — Care Management Important Message (Signed)
Important Message  Patient Details  Name: Lauren Bartlett MRN: [id number] Date of Birth: Mar 21, 1956   Medicare Important Message Given:  Yes    Othelia Riederer A, RN 02/18/2015, 9:45 AM

## 2015-02-18 NOTE — Discharge Summary (Signed)
[facility] NAME: Lauren Bartlett    MR#:  [id number]  DATE OF BIRTH:  [birth year]  DATE OF ADMISSION:  02/16/2015 ADMITTING PHYSICIAN: Fritzi Mandes, MD  DATE OF DISCHARGE: 02/18/2015  PRIMARY CARE PHYSICIAN: [name], MD    ADMISSION DIAGNOSIS:  COPD with acute exacerbation ([place]) [J44.1]  DISCHARGE DIAGNOSIS:  Active Problems:   COPD with hypoxia ([place])   SECONDARY DIAGNOSIS:   Past Medical History  Diagnosis Date  . Congenital absence of one kidney   . History of sciatica   . Depression     treated at [facility]  . Hypertension   . hypothyrodism   . COPD (chronic obstructive pulmonary disease) ([place])   . Anxiety     HOSPITAL COURSE:    1. Hypoxia with COPD exacerbation mild: initially treated with nebulizers and Decadron. Initial sats 89% on room air with ambulation. This is improved. Oxygen saturations have now been in the 90sto 100 with rest and ambulation. No shortness of breath or wheezing. She is discharged on her former regimen. We did stop steroids during the hospitalization due to nausea. It does not seem that she needs to be on steroids at this time.  2. Hypertension: Well controlled on home regimen. Continue hydrochlorothiazide and losartan  3. Postnasal drip with episodes of coughing at nighttime: Continue Zyrtec  DISCHARGE CONDITIONS:   Stable.  CONSULTS OBTAINED:   none  DRUG ALLERGIES:   Allergies  Allergen Reactions  . Codeine Hives and Nausea And Vomiting  . Penicillin G Itching  . Prednisone Palpitations    DISCHARGE MEDICATIONS:   Current Discharge Medication List    CONTINUE these medications which have NOT CHANGED   Details  albuterol (PROVENTIL HFA;VENTOLIN HFA) 108 (90 BASE) MCG/ACT inhaler Inhale 2 puffs into the lungs every 6 (six) hours as needed for wheezing or shortness of breath. WITH A SPACER Qty: 1 Inhaler, Refills: 0     calcium-vitamin D (OSCAL WITH D) 500-200 MG-UNIT per tablet Take 1 tablet by mouth daily with breakfast. Qty: 90 tablet, Refills: 2    ergocalciferol (DRISDOL) 50000 UNITS capsule Take 1 capsule (50,000 Units total) by mouth once a week. Qty: 4 capsule, Refills: 0    hydrochlorothiazide (HYDRODIURIL) 25 MG tablet Take 1 tablet (25 mg total) by mouth daily. Qty: 30 tablet, Refills: 5    levocetirizine (XYZAL) 5 MG tablet Take 1 tablet (5 mg total) by mouth every evening. Qty: 30 tablet, Refills: 5    levothyroxine (SYNTHROID, LEVOTHROID) 112 MCG tablet TAKE 1 TABLET BY MOUTH EVERY DAY Qty: 30 tablet, Refills: 9    losartan (COZAAR) 100 MG tablet TAKE 1 TABLET BY MOUTH DAILY Qty: 30 tablet, Refills: 5    Umeclidinium-Vilanterol (ANORO ELLIPTA) 62.5-25 MCG/INH AEPB Inhale 1 puff into the lungs daily.      STOP taking these medications     cefUROXime (CEFTIN) 500 MG tablet      guaifenesin (ROBITUSSIN CHEST CONGESTION) 100 MG/5ML syrup          DISCHARGE INSTRUCTIONS:   Regular diet. Outpatient physical therapy. No oxygen requirement. Stable condition.  If you experience worsening of your admission symptoms, develop shortness of breath, life threatening emergency, suicidal or homicidal thoughts you must seek medical attention immediately by calling 911 or calling your MD immediately  if symptoms less severe.  You Must read complete instructions/literature along with all the possible adverse reactions/side effects for all the Medicines  you take and that have been prescribed to you. Take any new Medicines after you have completely understood and accept all the possible adverse reactions/side effects.   Please note  You were cared for by a hospitalist during your hospital stay. If you have any questions about your discharge medications or the care you received while you were in the hospital after you are discharged, you can call the unit and asked to speak with the hospitalist on  call if the hospitalist that took care of you is not available. Once you are discharged, your primary care physician will handle any further medical issues. Please note that NO REFILLS for any discharge medications will be authorized once you are discharged, as it is imperative that you return to your primary care physician (or establish a relationship with a primary care physician if you do not have one) for your aftercare needs so that they can reassess your need for medications and monitor your lab values.    Today   CHIEF COMPLAINT:   Chief Complaint  Patient presents with  . Shortness of Breath  . Weakness    HISTORY OF PRESENT ILLNESS:  Lauren Bartlett is a 59 y.o. female with a known history of COPD he uses oxygen at nighttime, hypertension, anxiety, depression comes to the emergency room with increasing shortness of breath for last 2 days. Patient received several rounds of nebulizer treatment received some IV Decadron. Patient was feeling better however when ER tech try to ambulate her her sats dropped down into the upper 80s. She became tachypneic and hence internal medicine was called for further evaluation and management for COPD exacerbation. Patient was here last week with chronic bronchitis she just finished her course of antibiotic. Her chest x-ray looks okay. White count is normal. She denies any fever.  VITAL SIGNS:  Blood pressure 131/70, pulse 79, temperature 98.3 F (36.8 C), temperature source Axillary, resp. rate 18, height 5\' 2"  (1.575 m), weight 90.719 kg (200 lb), SpO2 100 %.  I/O:   Intake/Output Summary (Last 24 hours) at 02/18/15 1211 Last data filed at 02/18/15 0900  Gross per 24 hour  Intake    240 ml  Output      0 ml  Net    240 ml    PHYSICAL EXAMINATION:  GENERAL:  59 y.o.-year-old patient lying in the bed with no acute distress.  LUNGS: Normal breath sounds bilaterally, no wheezing, rales,rhonchi or crepitation. No use of accessory muscles of  respiration.  CARDIOVASCULAR: S1, S2 normal. No murmurs, rubs, or gallops.  ABDOMEN: Soft, non-tender, non-distended. Bowel sounds present. No organomegaly or mass.  EXTREMITIES: No pedal edema, cyanosis, or clubbing.  NEUROLOGIC: Cranial nerves II through XII are intact. Muscle strength 5/5 in all extremities. Sensation intact. Gait not checked.  PSYCHIATRIC: The patient is alert and oriented x 3. Flat affect. Avoids eye contact SKIN: No obvious rash, lesion, or ulcer.   DATA REVIEW:   CBC  Recent Labs Lab 02/16/15 0418  WBC 8.8  HGB 12.8  HCT 39.2  PLT 143*    Chemistries   Recent Labs Lab 02/16/15 0418  NA 143  K 4.1  CL 106  CO2 32  GLUCOSE 108*  BUN 18  CREATININE 0.86  CALCIUM 8.7*    Cardiac Enzymes  Recent Labs Lab 02/16/15 0418  TROPONINI <0.03    Microbiology Results  Results for orders placed or performed in visit on 09/11/14  Fecal occult blood, imunochemical     Status: None  Collection Time: 09/11/14  3:21 PM  Result Value Ref Range Status   Fecal Occult Bld Negative Negative Final    RADIOLOGY:  No results found.  EKG:   Orders placed or performed during the hospital encounter of 02/16/15  . ED EKG  . ED EKG  . EKG 12-Lead  . EKG 12-Lead      Management plans discussed with the patient, family and they are in agreement.  CODE STATUS:     Code Status Orders        Start     Ordered   02/16/15 0937  Full code   Continuous     02/16/15 0936      TOTAL TIME TAKING CARE OF THIS PATIENT: 35 minutes.  Greater than 50% of time spent in care coordination and counseling.  Myrtis Ser M.D on 02/18/2015 at 12:11 PM  Between 7am to 6pm - Pager - [phone]  After 6pm go to [email or web address] - password [email or web address] [facility]  Office  [phone]  CC: Primary care physician; [name], MD

## 2015-02-19 NOTE — Care Management (Signed)
Post discharge: Received call from patient not understanding where to go for outpatient PT and that "home health never came out". Patient states she remembers receiving a Rx for physical therapy. I gave her two contact numbers for outpatient PT in [place] area- [facility] and [facility]. Patient instructed to take Rx to either clinic. She agrees. No further RNCM needs. Case closed.

## 2015-02-20 ENCOUNTER — Telehealth: Payer: Self-pay | Admitting: Internal Medicine

## 2015-02-20 ENCOUNTER — Telehealth: Payer: Self-pay

## 2015-02-20 NOTE — Telephone Encounter (Signed)
Pt wants to know if she is supposed to take [place] all the time or just during allergy season? Thank You!

## 2015-02-20 NOTE — Telephone Encounter (Signed)
Transition Care Management Follow-up Telephone Call   Date discharged? 02/18/15  How have you been since you were released from the hospital? Better since returning home. Have not needed to use the Nebulizer for wheezing. Uses oxygen at night and sleeping well with little to no cough. No nausea.  Do you understand why you were in the hospital? Yes I was having trouble with my breathing.   Do you understand the discharge instructions? Yes no problems with anything   Where were you discharged to? Home   Items Reviewed:  Medications reviewed: Yes, no change  Allergies reviewed: Yes, no change  Dietary changes reviewed: Yes, heart healthy diet with no problem  Referrals reviewed: Yes, no change   Functional Questionnaire:   Activities of Daily Living (ADLs):   She states they are independent in the following:  Independent in all ADLs except ambulating  States they require assistance with the following: Ambulating (uses walker/cane)   Any transportation issues/concerns?: No    Any patient concerns? Would like to discontinue XYZAL medication due to fear of nose bleeds and only use Claritin as needed.    Confirmed importance and date/time of follow-up visits scheduled Yes, patient to call back and schedule as soon as possible after taking a closer look at her availability.  Provider Appointment to be booked with Dr. Derrel Nip (PCP)  Confirmed with patient if condition begins to worsen call PCP or go to the ER.  Patient was given the office number and encouraged to call back with question or concerns.  : Yes, patient verbalized understanding

## 2015-02-21 NOTE — Telephone Encounter (Signed)
No answer on phone number

## 2015-02-27 ENCOUNTER — Ambulatory Visit: Payer: Medicare Other | Admitting: Physical Therapy

## 2015-02-27 ENCOUNTER — Ambulatory Visit (INDEPENDENT_AMBULATORY_CARE_PROVIDER_SITE_OTHER): Payer: Medicare Other | Admitting: Internal Medicine

## 2015-02-27 ENCOUNTER — Encounter: Payer: Self-pay | Admitting: Internal Medicine

## 2015-02-27 VITALS — BP 110/76 | HR 72 | Temp 98.2°F | Resp 14 | Ht 62.0 in | Wt 197.8 lb

## 2015-02-27 DIAGNOSIS — E669 Obesity, unspecified: Secondary | ICD-10-CM | POA: Diagnosis not present

## 2015-02-27 DIAGNOSIS — Z23 Encounter for immunization: Secondary | ICD-10-CM

## 2015-02-27 DIAGNOSIS — R0902 Hypoxemia: Secondary | ICD-10-CM

## 2015-02-27 DIAGNOSIS — J449 Chronic obstructive pulmonary disease, unspecified: Secondary | ICD-10-CM

## 2015-02-27 DIAGNOSIS — E559 Vitamin D deficiency, unspecified: Secondary | ICD-10-CM

## 2015-02-27 DIAGNOSIS — I340 Nonrheumatic mitral (valve) insufficiency: Secondary | ICD-10-CM

## 2015-02-27 DIAGNOSIS — E038 Other specified hypothyroidism: Secondary | ICD-10-CM

## 2015-02-27 DIAGNOSIS — I10 Essential (primary) hypertension: Secondary | ICD-10-CM

## 2015-02-27 DIAGNOSIS — J441 Chronic obstructive pulmonary disease with (acute) exacerbation: Secondary | ICD-10-CM

## 2015-02-27 NOTE — Assessment & Plan Note (Addendum)
S/p recurrent hospitalizations for respiratory failure with hypoxia. Recovery complicated by pulmonary hypertension due to mitral regurgitation.  Now resolved.

## 2015-02-27 NOTE — Patient Instructions (Addendum)
We will re check your thyroid function when you return for your wellness visit in December   Please fast for those labs   You can continue claritin D and use vaseline to moisturize the inside of your nose  To prevent nosebleeds

## 2015-02-27 NOTE — Progress Notes (Signed)
Subjective:  Patient ID: Lauren Bartlett, female    DOB: Oct 16, 1955  Age: 59 y.o. MRN: [id number]  CC: The primary encounter diagnosis was Essential hypertension. Diagnoses of Other specified hypothyroidism, Vitamin D deficiency, Need for prophylactic vaccination against Streptococcus pneumoniae (pneumococcus), COPD exacerbation ([place]), COPD with hypoxia ([place]), Severe mitral regurgitation by prior echocardiogram, and Obesity (BMI 30-39.9) were also pertinent to this visit.  HPI Aslan Crable presents for [place].  She was admitted to [facility] on Nov 4 and discharged on Nov 7th.  The admitting diagnosis was  acute respiratory failure secondary to COPD exacerbation .  She reports that she was discharged hoe before she felt ready and in a very weakened state.  She continued to feel weak at home and continued to decline  upon discharge. She returned to the ER on Nov 18th with worsening dyspnea of 2 days duration and was hypoxic despite treatment with steroids and nebulized bronchodilators so she  was Readmitted Nov 18 to Nov 20 with same diagnosis.  She was  kept for additional day due to recurrent vomiting and abd pain, attributed to steroid induced gastritis. (Decadron)   Stomach has been fine since then and her appetite has returned .  Bowels moving without diarrhea or constipation .  PT has been ordered to start on Thursday   Records reviewed with patient today. Her antihistamine was changed in house from [place] to claritin D   Outpatient Prescriptions Prior to Visit  Medication Sig Dispense Refill  . albuterol (PROVENTIL HFA;VENTOLIN HFA) 108 (90 BASE) MCG/ACT inhaler Inhale 2 puffs into the lungs every 6 (six) hours as needed for wheezing or shortness of breath. WITH A SPACER 1 Inhaler 0  . calcium-vitamin D (OSCAL WITH D) 500-200 MG-UNIT per tablet Take 1 tablet by mouth daily with breakfast. 90 tablet 2  . ergocalciferol (DRISDOL) 50000 UNITS capsule Take 1 capsule (50,000 Units total) by mouth  once a week. 4 capsule 0  . hydrochlorothiazide (HYDRODIURIL) 25 MG tablet Take 1 tablet (25 mg total) by mouth daily. 30 tablet 5  . levothyroxine (SYNTHROID, LEVOTHROID) 112 MCG tablet TAKE 1 TABLET BY MOUTH EVERY DAY 30 tablet 9  . losartan (COZAAR) 100 MG tablet TAKE 1 TABLET BY MOUTH DAILY 30 tablet 5  . Umeclidinium-Vilanterol (ANORO ELLIPTA) 62.5-25 MCG/INH AEPB Inhale 1 puff into the lungs daily.    [name] levocetirizine (XYZAL) 5 MG tablet Take 1 tablet (5 mg total) by mouth every evening. (Patient not taking: Reported on 02/27/2015) 30 tablet 5   No facility-administered medications prior to visit.    Review of Systems;  Patient denies headache, fevers, malaise, unintentional weight loss, skin rash, eye pain, sinus congestion and sinus pain, sore throat, dysphagia,  hemoptysis , cough, dyspnea, wheezing, chest pain, palpitations, orthopnea, edema, abdominal pain, nausea, melena, diarrhea, constipation, flank pain, dysuria, hematuria, urinary  Frequency, nocturia, numbness, tingling, seizures,  Focal weakness, Loss of consciousness,  Tremor, insomnia, depression, anxiety, and suicidal ideation.      Objective:  BP 110/76 mmHg  Pulse 72  Temp(Src) 98.2 F (36.8 C) (Oral)  Resp 14  Ht 5\' 2"  (1.575 m)  Wt 197 lb 12 oz (89.699 kg)  BMI 36.16 kg/m2  SpO2 98%  BP Readings from Last 3 Encounters:  02/27/15 110/76  02/18/15 131/70  02/05/15 140/82    Wt Readings from Last 3 Encounters:  02/27/15 197 lb 12 oz (89.699 kg)  02/16/15 200 lb (90.719 kg)  02/03/15 200 lb (90.719 kg)  General appearance: alert, cooperative and appears stated age Ears: normal TM's and external ear canals both ears Throat: lips, mucosa, and tongue normal; teeth and gums normal Neck: no adenopathy, no carotid bruit, supple, symmetrical, trachea midline and thyroid not enlarged, symmetric, no tenderness/mass/nodules Back: symmetric, no curvature. ROM normal. No CVA tenderness. Lungs: clear to  auscultation bilaterally Heart: regular rate and rhythm, S1, S2 normal, no murmur, click, rub or gallop Abdomen: soft, non-tender; bowel sounds normal; no masses,  no organomegaly Pulses: 2+ and symmetric Skin: Skin color, texture, turgor normal. No rashes or lesions Lymph nodes: Cervical, supraclavicular, and axillary nodes normal.  No results found for: HGBA1C  Lab Results  Component Value Date   CREATININE 0.86 02/16/2015   CREATININE 0.75 02/03/2015   CREATININE 0.71 01/08/2015    Lab Results  Component Value Date   WBC 8.8 02/16/2015   HGB 12.8 02/16/2015   HCT 39.2 02/16/2015   PLT 143* 02/16/2015   GLUCOSE 108* 02/16/2015   CHOL 169 01/08/2015   TRIG 131.0 01/08/2015   HDL 48.80 01/08/2015   LDLCALC 94 01/08/2015   ALT 13 01/08/2015   AST 17 01/08/2015   NA 143 02/16/2015   K 4.1 02/16/2015   CL 106 02/16/2015   CREATININE 0.86 02/16/2015   BUN 18 02/16/2015   CO2 32 02/16/2015   TSH 0.24* 01/08/2015   INR 1.1 02/14/2013    Dg Chest 2 View  02/16/2015  CLINICAL DATA:  Shortness of breath and wheezing for 2 weeks. EXAM: CHEST  2 VIEW COMPARISON:  02/03/2015 FINDINGS: The cardiomediastinal contours are unchanged, heart at the upper limits of normal in size Pulmonary vasculature is normal. No consolidation, pleural effusion, or pneumothorax. No acute osseous abnormalities are seen. IMPRESSION: No acute process or change from prior exam. Electronically Signed   By: Jeb Levering M.D.   On: 02/16/2015 05:08    Assessment & Plan:   Problem List Items Addressed This Visit    Obesity (BMI 30-39.9)    Continued gradual  reduction of  BMI with > 60 lb weight loss thus far.  encouraged  Continued weight loss with goal of 10% of body weigh over the next 6 months using a low glycemic index diet and regular exercise a minimum of 5 days per week.          Severe mitral regurgitation by prior echocardiogram    Stable per  ECHO 3 / 2016.  Has seen [facility] since  discharge,  No change in therapy.        COPD exacerbation ([place])    S/p recurrent hospitalizations for respiratory failure with hypoxia. Recovery complicated by pulmonary hypertension due to mitral regurgitation.  Now resolved.        Relevant Medications   loratadine-pseudoephedrine (CLARITIN-D 24-HOUR) 10-240 MG 24 hr tablet   COPD with hypoxia ([facility])    Continue current therapy with inhaled bronchodilators      Relevant Medications   loratadine-pseudoephedrine (CLARITIN-D 24-HOUR) 10-240 MG 24 hr tablet   Vitamin D deficiency   Relevant Orders   VITAMIN D 25 Hydroxy (Vit-D Deficiency, Fractures)   Hypertension - Primary   Relevant Orders   Comprehensive metabolic panel   Lipid panel   Hypothyroidism   Relevant Orders   TSH    Other Visit Diagnoses    Need for prophylactic vaccination against Streptococcus pneumoniae (pneumococcus)        Relevant Orders    Pneumococcal conjugate vaccine 13-valent (Completed)  I am having Ms. Ullman maintain her albuterol, Umeclidinium-Vilanterol, calcium-vitamin D, levocetirizine, ergocalciferol, levothyroxine, losartan, hydrochlorothiazide, and loratadine-pseudoephedrine.  Meds ordered this encounter  Medications  . loratadine-pseudoephedrine (CLARITIN-D 24-HOUR) 10-240 MG 24 hr tablet    Sig: Take 1 tablet by mouth daily.    There are no discontinued medications.  Follow-up: Return in about 3 months (around 05/29/2015) for needs fasting labs the morning of wellness.   [name], MD

## 2015-02-27 NOTE — Progress Notes (Signed)
Pre-visit discussion using our clinic review tool. No additional management support is needed unless otherwise documented below in the visit note.  

## 2015-02-27 NOTE — Assessment & Plan Note (Addendum)
Continue current therapy with inhaled bronchodilators

## 2015-02-27 NOTE — Assessment & Plan Note (Signed)
Continued gradual  reduction of  BMI with > 60 lb weight loss thus far.  encouraged  Continued weight loss with goal of 10% of body weigh over the next 6 months using a low glycemic index diet and regular exercise a minimum of 5 days per week.

## 2015-02-27 NOTE — Assessment & Plan Note (Signed)
Stable per  ECHO 3 / 2016.  Has seen [facility] since discharge,  No change in therapy.

## 2015-03-01 ENCOUNTER — Ambulatory Visit: Payer: Medicare Other | Attending: Internal Medicine | Admitting: Physical Therapy

## 2015-03-01 ENCOUNTER — Encounter: Payer: Self-pay | Admitting: Physical Therapy

## 2015-03-01 DIAGNOSIS — R5381 Other malaise: Secondary | ICD-10-CM | POA: Insufficient documentation

## 2015-03-01 DIAGNOSIS — R262 Difficulty in walking, not elsewhere classified: Secondary | ICD-10-CM | POA: Diagnosis not present

## 2015-03-01 DIAGNOSIS — R531 Weakness: Secondary | ICD-10-CM | POA: Diagnosis present

## 2015-03-01 NOTE — Therapy (Addendum)
[facility] [address]. [place], [place], [address] Phone: [phone]   Fax:  [phone]  Physical Therapy Evaluation  Patient Details  Name: Lauren Bartlett MRN: [id number] Date of Birth: [birth year] Referring Provider: Dr. Volanda Napoleon  Encounter Date: 03/01/2015      PT End of Session - 03/01/15 1013    Visit Number 1   Number of Visits 8   Date for PT Re-Evaluation 03/29/15   Authorization - Visit Number 1   Authorization - Number of Visits 10   PT Start Time 0903   PT Stop Time 0950   PT Time Calculation (min) 47 min   Activity Tolerance Patient tolerated treatment well;No increased pain   Behavior During Therapy [facility] for tasks assessed/performed      Past Medical History  Diagnosis Date  . Congenital absence of one kidney   . History of sciatica   . Depression     treated at [place]  . Hypertension   . hypothyrodism   . COPD (chronic obstructive pulmonary disease) ([place])   . Anxiety     Past Surgical History  Procedure Laterality Date  . Combined hysterectomy abdominal w/ a&p repair / oophorectomy  1996    benign tumor  . Inner ear surgery      bilateral  . Abdominal hysterectomy    . Breast biopsy Right 2011    [facility]< benign    There were no vitals filed for this visit.  Visit Diagnosis:  Difficulty walking  Generalized weakness  Physical deconditioning      Subjective Assessment - 03/01/15 1006    Subjective Pt reports no pain upon arrival to PT. Pt states she feels weak all over and is not able to perform daily tasks like she used to be able to. Pt states that she is tired and feels like she has no energy. Pt reports no falls and that she is walking with a SPC or rollator.   Patient Stated Goals get back to PLOF/back to do like she used to with house work without being tired/walking program/walk without AD   Currently in Pain? No/denies         OBJECTIVE: There ex: Side stepping with B UE support  in the // bars x 4. Forwards and backwards walking in the // bars with B UE support. Increased complaints of L hip soreness with side stepping and backwards walking. NuStep L3 8 mins (no charge). O2 sat during Nustep: 91%, post exercise: 98%. Standing hip abduction/flexion/extension x 10 each leg. (given for HEP). Standing heel raises and marching x 15 each with no complaints of pain (given as HEP).         PT Education - 03/01/15 1012    Education provided Yes   Education Details Pt educated on BERG score and importance of walking with AD. Pt given hip strengthening exercises above.    Person(s) Educated Patient   Methods Explanation;Demonstration;Handout   Comprehension Returned demonstration;Verbalized understanding             PT Long Term Goals - 03/01/15 1239    PT LONG TERM GOAL #1   Title Pt will improve BERG balance score to > 50/56 in order to safely ambulate household distances without use of an AD.   Baseline 43/56   Time 4   Period Weeks   Status New   PT LONG TERM GOAL #2   Title Pt will improve LEFS to > 65/80 in order to report  the ability to perform household chores without increased fatigue.   Time 4   Period Weeks   Status New   PT LONG TERM GOAL #3   Title Pt will be independent with HEP in order to increase L hip flexion strength by 1/2 MMT grade in order to ambulate safely.   Baseline 3/5    Time 4   Period Weeks   Status New   PT LONG TERM GOAL #4   Title Pt will consistently ambulate community distances with increased cadence and no LOB noted with least restrictive AD.   Time 4   Period Weeks   Status New            Plan - Mar 05, 2015 1013    Clinical Impression Statement Pt is a pleasant 59 y.o. F referred to PT following a COPD exacerbation. Pt reports no pain and no falls just feels weak overall. Pt's AROM is WNL. Pt's MMT B LE is a 4/5 except for L hip flexion is a 3/5 and R knee flexion is a 3/5. Pt ambulates with a SPC and decreased heel  strike. Pt is unable to correct heel strike with verbal cues. Pt ambulates with decreased cadence and decreased knee flexion. Pt stands and ambulates with khyphotic posture. Pt is able to ambulate in the parallel bars with a slightly faster gait speed but remains khyphotic even with verbal and  visual cues. BERG: 43/56; LEFS: 53/80. Pt is limited secondary to fear of falling with balance challenges although she has not fallen and demonstrates proper compensatory patterns. Pt will benefit from short term skilled PT to regain prior level of function, improve balance, strength and safety with gait.    Pt will benefit from skilled therapeutic intervention in order to improve on the following deficits Abnormal gait;Decreased activity tolerance;Decreased balance;Decreased mobility;Decreased strength;Postural dysfunction;Improper body mechanics;Decreased endurance;Difficulty walking   Rehab Potential Fair   Clinical Impairments Affecting Rehab Potential COPD and other co-morbidities   PT Frequency 2x / week   PT Duration 4 weeks   PT Treatment/Interventions ADLs/Self Care Home Management;Cryotherapy;Moist Heat;Balance training;Therapeutic exercise;Therapeutic activities;Manual techniques;Functional mobility training;Stair training;Gait training;Neuromuscular re-education;Patient/family education   Consulted and Agree with Plan of Care Patient          G-Codes - 03-05-15 1650    Functional Assessment Tool Used Clinical judgement/ gait/ Berg/ LEFS   Functional Limitation Mobility: Walking and moving around   Mobility: Walking and Moving Around Current Status [phone]) At least 20 percent but less than 40 percent impaired, limited or restricted   Mobility: Walking and Moving Around Goal Status [id number]) At least 1 percent but less than 20 percent impaired, limited or restricted       Problem List Patient Active Problem List   Diagnosis Date Noted  . COPD with hypoxia ([place]) 02/16/2015  . COPD exacerbation  ([place]) 02/03/2015  . Vitamin D deficiency 01/11/2015  . S/P hysterectomy with oophorectomy 09/22/2013  . Pulmonary hypertension ([place]) 07/14/2013  . Edema 01/12/2013  . Hypothyroidism 05/04/2012  . Severe mitral regurgitation by prior echocardiogram 01/11/2012  . Obesity (BMI 30-39.9) 08/04/2011  . Depression   . Hypertension   . Congenital absence of one kidney   . History of sciatica     Pura Spice, SPT 03/02/2015, 4:51 PM  [facility] [address] [place], [place], [address] Phone: [phone]   Fax:  [phone]  Name: Lauren Bartlett MRN: [id number] Date of Birth: Jun 21, 1955

## 2015-03-06 ENCOUNTER — Ambulatory Visit: Payer: Medicare Other | Admitting: Physical Therapy

## 2015-03-06 DIAGNOSIS — R5381 Other malaise: Secondary | ICD-10-CM

## 2015-03-06 DIAGNOSIS — R262 Difficulty in walking, not elsewhere classified: Secondary | ICD-10-CM | POA: Diagnosis not present

## 2015-03-06 DIAGNOSIS — R531 Weakness: Secondary | ICD-10-CM

## 2015-03-06 NOTE — Therapy (Signed)
[facility] [address] [place], [place], [address] Phone: [phone]   Fax:  [phone]  Physical Therapy Treatment  Patient Details  Name: Lauren Bartlett MRN: [id number] Date of Birth: November 12, 1955 Referring Provider: Dr. Volanda Napoleon  Encounter Date: 03/06/2015      PT End of Session - 03/07/15 1304    Visit Number 2   Number of Visits 8   Date for PT Re-Evaluation 03/29/15   Authorization - Visit Number 2   Authorization - Number of Visits 10   PT Start Time [id number]   PT Stop Time [id number]   PT Time Calculation (min) 57 min   Activity Tolerance Patient tolerated treatment well;No increased pain;Patient limited by fatigue   Behavior During Therapy [facility] for tasks assessed/performed      Past Medical History  Diagnosis Date  . Congenital absence of one kidney   . History of sciatica   . Depression     treated at [place]  . Hypertension   . hypothyrodism   . COPD (chronic obstructive pulmonary disease) ([place])   . Anxiety     Past Surgical History  Procedure Laterality Date  . Combined hysterectomy abdominal w/ a&p repair / oophorectomy  1996    benign tumor  . Inner ear surgery      bilateral  . Abdominal hysterectomy    . Breast biopsy Right 2011    [facility]< benign    There were no vitals filed for this visit.  Visit Diagnosis:  Difficulty walking  Generalized weakness  Physical deconditioning      Subjective Assessment - 03/07/15 1259    Subjective Pt. states she has no pain but is short of breath/ fatigued today.  Pt. reports compliance with initial PT HEP.     Limitations Walking;House hold activities   Patient Stated Goals get back to PLOF/back to do like she used to with house work without being tired/walking program/walk without AD   Currently in Pain? No/denies       OBJECTIVE: There ex:  Nustep L4 10 min. B UE/LE (no rest breaks)- O2 sat. 98%.  Standing hip flexion/ abd./ ext./ knee flexion 20x each (mirror  feedback)- reviewed HEP.  Side stepping with B UE support to no UE assist in the // bars x 4.  Resisted gait 2BTB 10x forward and backwards with B UE assist in //-bars for safety.  Standing heel raises 20x with posture cuing.  Neuro: 3" plinth step overs and Airex step ups (wt. Shifting) with proper head position/ cuing.    Pt. Benefits from SBA/ mod. Verbal cuing for proper technique with balance tasks.  No LOB but extra time for safety and pt. Easily fatigued.          PT Long Term Goals - 03/01/15 1239    PT LONG TERM GOAL #1   Title Pt will improve BERG balance score to > 50/56 in order to safely ambulate household distances without use of an AD.   Baseline 43/56   Time 4   Period Weeks   Status New   PT LONG TERM GOAL #2   Title Pt will improve LEFS to > 65/80 in order to report the ability to perform household chores without increased fatigue.   Time 4   Period Weeks   Status New   PT LONG TERM GOAL #3   Title Pt will be independent with HEP in order to increase L hip flexion strength by 1/2 MMT grade  in order to ambulate safely.   Baseline 3/5    Time 4   Period Weeks   Status New   PT LONG TERM GOAL #4   Title Pt will consistently ambulate community distances with increased cadence and no LOB noted with least restrictive AD.   Time 4   Period Weeks   Status New               Plan - 03/07/15 1305    Clinical Impression Statement Pt. maintained a consistent O2 sat. rate of >95% t/o tx. session.  Pulse of 82 bpm.  Pt. works hard during PT tx. session and is focused on increase standing posture/ B LE muscle strength with limited rest breaks.    Pt will benefit from skilled therapeutic intervention in order to improve on the following deficits Abnormal gait;Decreased activity tolerance;Decreased balance;Decreased mobility;Decreased strength;Postural dysfunction;Improper body mechanics;Decreased endurance;Difficulty walking;Decreased range of motion   Rehab Potential  Fair   Clinical Impairments Affecting Rehab Potential COPD and other co-morbidities   PT Frequency 2x / week   PT Duration 4 weeks   PT Treatment/Interventions ADLs/Self Care Home Management;Cryotherapy;Moist Heat;Balance training;Therapeutic exercise;Therapeutic activities;Manual techniques;Functional mobility training;Stair training;Gait training;Neuromuscular re-education;Patient/family education   PT Next Visit Plan Progress gait/ B LE muscle strengthening.    PT Home Exercise Plan see handouts   Consulted and Agree with Plan of Care Patient        Problem List Patient Active Problem List   Diagnosis Date Noted  . COPD with hypoxia ([place]) 02/16/2015  . COPD exacerbation ([place]) 02/03/2015  . Vitamin D deficiency 01/11/2015  . S/P hysterectomy with oophorectomy 09/22/2013  . Pulmonary hypertension ([place]) 07/14/2013  . Edema 01/12/2013  . Hypothyroidism 05/04/2012  . Severe mitral regurgitation by prior echocardiogram 01/11/2012  . Obesity (BMI 30-39.9) 08/04/2011  . Depression   . Hypertension   . Congenital absence of one kidney   . History of sciatica    Pura Spice, PT, DPT # [id number]   03/07/2015, 1:08 PM  [place] [address] [place], [place], [address] Phone: [phone]   Fax:  [phone]  Name: Lauren Bartlett MRN: [id number] Date of Birth: 1955-06-27

## 2015-03-07 ENCOUNTER — Encounter: Payer: Self-pay | Admitting: Physical Therapy

## 2015-03-08 ENCOUNTER — Ambulatory Visit: Payer: Medicare Other | Admitting: Physical Therapy

## 2015-03-08 DIAGNOSIS — R262 Difficulty in walking, not elsewhere classified: Secondary | ICD-10-CM | POA: Diagnosis not present

## 2015-03-08 DIAGNOSIS — R5381 Other malaise: Secondary | ICD-10-CM

## 2015-03-08 DIAGNOSIS — R531 Weakness: Secondary | ICD-10-CM

## 2015-03-09 ENCOUNTER — Encounter: Payer: Self-pay | Admitting: Physical Therapy

## 2015-03-09 NOTE — Therapy (Signed)
[facility] [address] [place], [place], [address] Phone: [phone]   Fax:  [phone]  Physical Therapy Treatment  Patient Details  Name: Lauren Bartlett MRN: [id number] Date of Birth: 13-Apr-1955 Referring Provider: Dr. Volanda Napoleon  Encounter Date: 03/08/2015      PT End of Session - 03/09/15 1412    Visit Number 3   Number of Visits 8   Date for PT Re-Evaluation 03/29/15   Authorization - Visit Number 3   Authorization - Number of Visits 10   PT Start Time [id number]   PT Stop Time [id number]   PT Time Calculation (min) 55 min   Activity Tolerance Patient tolerated treatment well;No increased pain;Patient limited by fatigue   Behavior During Therapy [facility] for tasks assessed/performed      Past Medical History  Diagnosis Date  . Congenital absence of one kidney   . History of sciatica   . Depression     treated at [place]  . Hypertension   . hypothyrodism   . COPD (chronic obstructive pulmonary disease) ([place])   . Anxiety     Past Surgical History  Procedure Laterality Date  . Combined hysterectomy abdominal w/ a&p repair / oophorectomy  1996    benign tumor  . Inner ear surgery      bilateral  . Abdominal hysterectomy    . Breast biopsy Right 2011    [facility]< benign    There were no vitals filed for this visit.  Visit Diagnosis:  Difficulty walking  Generalized weakness  Physical deconditioning      Subjective Assessment - 03/09/15 1409    Subjective No new complaints.  Pt. states she is feeling better today and not as short of breathe.     Limitations Walking;House hold activities   Patient Stated Goals get back to PLOF/back to do like she used to with house work without being tired/walking program/walk without AD   Currently in Pain? No/denies      OBJECTIVE: There ex: Nustep L5 10 min. B UE/LE (no rest breaks)- O2 sat. 96%. Standing hip ex.: 2# ankle wts. with flexion/ abd./ ext./ knee flexion 20x each (mirror  feedback)- discussed HEP.  Seated hip flexion/ heel raises/ LAQ with 2# ankle wts. 20x each. Side stepping with min. UE support to no UE assist in the // bars x 5. Resisted gait 2BTB 10x forward and backwards with B UE assist in //-bars for safety. Neuro: 3" plinth step overs and Airex step ups (wt. Shifting) with proper head position/ cuing. Ambulating outside on grassy surfaces/ curbs/ ramp with use of SPC and min. To mod. Cuing for hip flexion and heel strike. Poor head posture/ posture.  Pt. Benefits from SBA/ mod. Verbal cuing for proper technique with balance tasks. No LOB but extra time for safety and pt. Easily fatigued.          PT Long Term Goals - 03/01/15 1239    PT LONG TERM GOAL #1   Title Pt will improve BERG balance score to > 50/56 in order to safely ambulate household distances without use of an AD.   Baseline 43/56   Time 4   Period Weeks   Status New   PT LONG TERM GOAL #2   Title Pt will improve LEFS to > 65/80 in order to report the ability to perform household chores without increased fatigue.   Time 4   Period Weeks   Status New   PT LONG TERM  GOAL #3   Title Pt will be independent with HEP in order to increase L hip flexion strength by 1/2 MMT grade in order to ambulate safely.   Baseline 3/5    Time 4   Period Weeks   Status New   PT LONG TERM GOAL #4   Title Pt will consistently ambulate community distances with increased cadence and no LOB noted with least restrictive AD.   Time 4   Period Weeks   Status New            Plan - 03/09/15 1413    Clinical Impression Statement Pt. requires several short seated rest breaks during ther.ex. and no significant changes noted with O2 sat. (maintained >96%).  Progressing with LE resisted ex. program and safely ambulating with use of SPC on level surfaces.  Mod. cuing to correct upright posture in sitting/ standing.     Pt will benefit from skilled therapeutic intervention in order to improve  on the following deficits Abnormal gait;Decreased activity tolerance;Decreased balance;Decreased mobility;Decreased strength;Postural dysfunction;Improper body mechanics;Decreased endurance;Difficulty walking;Decreased range of motion   Rehab Potential Fair   Clinical Impairments Affecting Rehab Potential COPD and other co-morbidities   PT Frequency 2x / week   PT Duration 4 weeks   PT Treatment/Interventions ADLs/Self Care Home Management;Cryotherapy;Moist Heat;Balance training;Therapeutic exercise;Therapeutic activities;Manual techniques;Functional mobility training;Stair training;Gait training;Neuromuscular re-education;Patient/family education   PT Next Visit Plan Progress gait/ B LE muscle strengthening.    PT Home Exercise Plan see handouts   Consulted and Agree with Plan of Care Patient        Problem List Patient Active Problem List   Diagnosis Date Noted  . COPD with hypoxia ([place]) 02/16/2015  . COPD exacerbation ([place]) 02/03/2015  . Vitamin D deficiency 01/11/2015  . S/P hysterectomy with oophorectomy 09/22/2013  . Pulmonary hypertension ([place]) 07/14/2013  . Edema 01/12/2013  . Hypothyroidism 05/04/2012  . Severe mitral regurgitation by prior echocardiogram 01/11/2012  . Obesity (BMI 30-39.9) 08/04/2011  . Depression   . Hypertension   . Congenital absence of one kidney   . History of sciatica    Pura Spice, PT, DPT # [id number]   03/09/2015, 2:30 PM  [facility] [address] [place], [place], [address] Phone: [phone]   Fax:  [phone]  Name: Lauren Bartlett MRN: [id number] Date of Birth: [birth year]

## 2015-03-13 ENCOUNTER — Ambulatory Visit: Payer: Medicare Other | Admitting: Physical Therapy

## 2015-03-13 ENCOUNTER — Encounter: Payer: Self-pay | Admitting: Physical Therapy

## 2015-03-13 DIAGNOSIS — R5381 Other malaise: Secondary | ICD-10-CM

## 2015-03-13 DIAGNOSIS — R262 Difficulty in walking, not elsewhere classified: Secondary | ICD-10-CM

## 2015-03-13 DIAGNOSIS — R531 Weakness: Secondary | ICD-10-CM

## 2015-03-13 NOTE — Therapy (Signed)
[facility] [address] [place], [place], [address] Phone: [phone]   Fax:  [phone]  Physical Therapy Treatment  Patient Details  Name: [place] MRN: [id number] Date of Birth: [birth year] Referring Provider: Dr. Volanda Napoleon  Encounter Date: 03/13/2015      PT End of Session - 03/13/15 0954    Visit Number 4   Number of Visits 8   Date for PT Re-Evaluation 03/29/15   Authorization - Visit Number 4   Authorization - Number of Visits 10   PT Start Time [id number]   PT Stop Time 0948   PT Time Calculation (min) 53 min   Activity Tolerance Patient tolerated treatment well;No increased pain;Patient limited by fatigue   Behavior During Therapy [facility] for tasks assessed/performed      Past Medical History  Diagnosis Date  . Congenital absence of one kidney   . History of sciatica   . Depression     treated at [place]  . Hypertension   . hypothyrodism   . COPD (chronic obstructive pulmonary disease) ([place])   . Anxiety     Past Surgical History  Procedure Laterality Date  . Combined hysterectomy abdominal w/ a&p repair / oophorectomy  1996    benign tumor  . Inner ear surgery      bilateral  . Abdominal hysterectomy    . Breast biopsy Right 2011    [facility]< benign    There were no vitals filed for this visit.  Visit Diagnosis:  Difficulty walking  Generalized weakness  Physical deconditioning      Subjective Assessment - 03/13/15 0953    Subjective Pt reports no complaints today. Pt states she had an active weekend and was able to navigate steps at church and fixing a plate without her SPC. Pt reports no falls or LOB since last tx session.   Limitations Walking;House hold activities   Patient Stated Goals get back to PLOF/back to do like she used to with house work without being tired/walking program/walk without AD   Currently in Pain? No/denies      OBJECTIVE: There ex: Warm up: Nustep L7 10 mins (no charge).  Step overs sideways over 3" steps with light touch/min A UE support x 20. Pt demonstrates good step clearance with L LE sideways and decreased length with R; pt is able to correct R with verbal cuing. Toe taps x 30 each side with B min A UE support. Pt reports increased difficulty with R toe taps due to standing on L LE. Standing marching x 20 with B UE support (issued as HEP). Gait training: Gait in // bars with R UE support only to simulate shifting cane to R UE and verbal cues for increased heel strike and knee flexion. Ambulation around the clinic and on the road with [facility] in R UE. Increased cadence noted as compared to [facility] in L UE. Tandem walking in the // bars. Neuro re-ed: Single leg balance with B UE min A with // bars; pt able to hold R LE for 15 seconds and L LE for 10 seconds. Balance on airex with no UE support for 30 seconds. Pt felt unsteady and relied on bars. Weight shifts from front to back and side to side x 1 min each direction. On airex: heel raises/toe raises x 1 min each.  Pt response to tx for medical necessity: Pt benefits from strengthening and gait training to increase functional mobility. Pt requires extra time for safety with  all activity.        PT Education - 03/13/15 0954    Education provided Yes   Education Details Pt given balance exercises for home: tandem stance/single leg stance/standing marching   Person(s) Educated Patient   Methods Explanation;Demonstration;Verbal cues;Handout   Comprehension Returned demonstration;Verbalized understanding             PT Long Term Goals - 03/01/15 1239    PT LONG TERM GOAL #1   Title Pt will improve BERG balance score to > 50/56 in order to safely ambulate household distances without use of an AD.   Baseline 43/56   Time 4   Period Weeks   Status New   PT LONG TERM GOAL #2   Title Pt will improve LEFS to > 65/80 in order to report the ability to perform household chores without increased fatigue.   Time 4   Period  Weeks   Status New   PT LONG TERM GOAL #3   Title Pt will be independent with HEP in order to increase L hip flexion strength by 1/2 MMT grade in order to ambulate safely.   Baseline 3/5    Time 4   Period Weeks   Status New   PT LONG TERM GOAL #4   Title Pt will consistently ambulate community distances with increased cadence and no LOB noted with least restrictive AD.   Time 4   Period Weeks   Status New             Plan - 03/13/15 0955    Clinical Impression Statement Pt requested 3 sitting rest breaks with there ex and gait training today. Pt's O2 sat stays above 95% during all there ex and HR around 88 bpm. Pt is able to demonstrate an increased cadence with SPC in her R UE to help with L LE instability. Pt demonstrates good proprioceptive feedback with balance on foam surface. Pt is able to maintain single leg stance on R for 15 seconds with B light touch/min A from UE. Pt has increased difficulty with single leg stance on L LE. Pt continues to demonstrate progress and commitment to improving functional mobility.    Pt will benefit from skilled therapeutic intervention in order to improve on the following deficits Abnormal gait;Decreased activity tolerance;Decreased balance;Decreased mobility;Decreased strength;Postural dysfunction;Improper body mechanics;Decreased endurance;Difficulty walking;Decreased range of motion   Rehab Potential Fair   Clinical Impairments Affecting Rehab Potential COPD and other co-morbidities   PT Frequency 2x / week   PT Duration 4 weeks   PT Treatment/Interventions ADLs/Self Care Home Management;Cryotherapy;Moist Heat;Balance training;Therapeutic exercise;Therapeutic activities;Manual techniques;Functional mobility training;Stair training;Gait training;Neuromuscular re-education;Patient/family education   PT Next Visit Plan strengthening/reasses BERG/single leg exercises/balance activities.   PT Home Exercise Plan see handouts   Consulted and Agree  with Plan of Care Patient        Problem List Patient Active Problem List   Diagnosis Date Noted  . Encounter for preventive health examination 03/14/2015  . Breast cancer screening 03/14/2015  . Need for hepatitis C screening test 03/14/2015  . COPD with hypoxia ([place]) 02/16/2015  . COPD exacerbation ([place]) 02/03/2015  . Vitamin D deficiency 01/11/2015  . S/P hysterectomy with oophorectomy 09/22/2013  . Pulmonary hypertension ([place]) 07/14/2013  . Edema 01/12/2013  . Hypothyroidism 05/04/2012  . Severe mitral regurgitation by prior echocardiogram 01/11/2012  . Obesity (BMI 30-39.9) 08/04/2011  . Depression   . Hypertension   . Congenital absence of one kidney   . History of sciatica  Pura Spice, PT, DPT # [phone]   03/14/2015, 2:01 PM  [facility] [address] [place], [place], [address] Phone: [phone]   Fax:  [phone]  Name: Lonie Lamagna MRN: [id number] Date of Birth: May 06, 1955

## 2015-03-14 ENCOUNTER — Encounter: Payer: Self-pay | Admitting: Internal Medicine

## 2015-03-14 ENCOUNTER — Ambulatory Visit (INDEPENDENT_AMBULATORY_CARE_PROVIDER_SITE_OTHER): Payer: Medicare Other | Admitting: Internal Medicine

## 2015-03-14 VITALS — BP 108/70 | HR 72 | Temp 97.6°F | Resp 12 | Ht 62.0 in | Wt 197.5 lb

## 2015-03-14 DIAGNOSIS — E559 Vitamin D deficiency, unspecified: Secondary | ICD-10-CM | POA: Diagnosis not present

## 2015-03-14 DIAGNOSIS — I10 Essential (primary) hypertension: Secondary | ICD-10-CM | POA: Diagnosis not present

## 2015-03-14 DIAGNOSIS — E669 Obesity, unspecified: Secondary | ICD-10-CM | POA: Diagnosis not present

## 2015-03-14 DIAGNOSIS — Z1159 Encounter for screening for other viral diseases: Secondary | ICD-10-CM

## 2015-03-14 DIAGNOSIS — E034 Atrophy of thyroid (acquired): Secondary | ICD-10-CM

## 2015-03-14 DIAGNOSIS — Z0000 Encounter for general adult medical examination without abnormal findings: Secondary | ICD-10-CM | POA: Diagnosis not present

## 2015-03-14 DIAGNOSIS — Z1239 Encounter for other screening for malignant neoplasm of breast: Secondary | ICD-10-CM

## 2015-03-14 DIAGNOSIS — E038 Other specified hypothyroidism: Secondary | ICD-10-CM | POA: Diagnosis not present

## 2015-03-14 LAB — LIPID PANEL
CHOL/HDL RATIO: 3
Cholesterol: 173 mg/dL (ref 0–200)
HDL: 57.1 mg/dL (ref >39.00)
LDL CALC: 99 mg/dL (ref 0–99)
NONHDL: 116.21
Triglycerides: 85 mg/dL (ref 0.0–149.0)
VLDL: 17 mg/dL (ref 0.0–40.0)

## 2015-03-14 LAB — COMPREHENSIVE METABOLIC PANEL
ALK PHOS: 66 U/L (ref 39–117)
ALT: 11 U/L (ref 0–35)
AST: 15 U/L (ref 0–37)
Albumin: 4 g/dL (ref 3.5–5.2)
BILIRUBIN TOTAL: 0.7 mg/dL (ref 0.2–1.2)
BUN: 14 mg/dL (ref 6–23)
CO2: 31 meq/L (ref 19–32)
CREATININE: 0.77 mg/dL (ref 0.40–1.20)
Calcium: 8.8 mg/dL (ref 8.4–10.5)
Chloride: 102 mEq/L (ref 96–112)
GFR: 81.47 mL/min (ref >60.00)
GLUCOSE: 80 mg/dL (ref 70–99)
Potassium: 4.1 mEq/L (ref 3.5–5.1)
SODIUM: 141 meq/L (ref 135–145)
TOTAL PROTEIN: 7.1 g/dL (ref 6.0–8.3)

## 2015-03-14 LAB — TSH: TSH: 1.02 u[IU]/mL (ref 0.35–4.50)

## 2015-03-14 LAB — VITAMIN D 25 HYDROXY (VIT D DEFICIENCY, FRACTURES): VITD: 19.9 ng/mL — AB (ref 30.00–100.00)

## 2015-03-14 NOTE — Patient Instructions (Signed)
Your next mammogram is due in July 2017.  Please call in June to schedule Menopause is a normal process in which your reproductive ability comes to an end. This process happens gradually over a span of months to years, usually between the ages of 47 and 37. Menopause is complete when you have missed 12 consecutive menstrual periods. It is important to talk with your health care provider about some of the most common conditions that affect postmenopausal women, such as heart disease, cancer, and bone loss (osteoporosis). Adopting a healthy lifestyle and getting preventive care can help to promote your health and wellness. Those actions can also lower your chances of developing some of these common conditions. WHAT SHOULD I KNOW ABOUT MENOPAUSE? During menopause, you may experience a number of symptoms, such as:  Moderate-to-severe hot flashes.  Night sweats.  Decrease in sex drive.  Mood swings.  Headaches.  Tiredness.  Irritability.  Memory problems.  Insomnia. Choosing to treat or not to treat menopausal changes is an individual decision that you make with your health care provider. WHAT SHOULD I KNOW ABOUT HORMONE REPLACEMENT THERAPY AND SUPPLEMENTS? Hormone therapy products are effective for treating symptoms that are associated with menopause, such as hot flashes and night sweats. Hormone replacement carries certain risks, especially as you become older. If you are thinking about using estrogen or estrogen with progestin treatments, discuss the benefits and risks with your health care provider. WHAT SHOULD I KNOW ABOUT HEART DISEASE AND STROKE? Heart disease, heart attack, and stroke become more likely as you age. This may be due, in part, to the hormonal changes that your body experiences during menopause. These can affect how your body processes dietary fats, triglycerides, and cholesterol. Heart attack and stroke are both medical emergencies. There are many things that you can do  to help prevent heart disease and stroke:  Have your blood pressure checked at least every 1-2 years. High blood pressure causes heart disease and increases the risk of stroke.  If you are 59-46 years old, ask your health care provider if you should take aspirin to prevent a heart attack or a stroke.  Do not use any tobacco products, including cigarettes, chewing tobacco, or electronic cigarettes. If you need help quitting, ask your health care provider.  It is important to eat a healthy diet and maintain a healthy weight.  Be sure to include plenty of vegetables, fruits, low-fat dairy products, and lean protein.  Avoid eating foods that are high in solid fats, added sugars, or salt (sodium).  Get regular exercise. This is one of the most important things that you can do for your health.  Try to exercise for at least 150 minutes each week. The type of exercise that you do should increase your heart rate and make you sweat. This is known as moderate-intensity exercise.  Try to do strengthening exercises at least twice each week. Do these in addition to the moderate-intensity exercise.  Know your numbers.Ask your health care provider to check your cholesterol and your blood glucose. Continue to have your blood tested as directed by your health care provider. WHAT SHOULD I KNOW ABOUT CANCER SCREENING? There are several types of cancer. Take the following steps to reduce your risk and to catch any cancer development as early as possible. Breast Cancer  Practice breast self-awareness.  This means understanding how your breasts normally appear and feel.  It also means doing regular breast self-exams. Let your health care provider know about any changes, no  matter how small.  If you are 59 or older, have a clinician do a breast exam (clinical breast exam or CBE) every year. Depending on your age, family history, and medical history, it may be recommended that you also have a yearly breast  X-ray (mammogram).  If you have a family history of breast cancer, talk with your health care provider about genetic screening.  If you are at high risk for breast cancer, talk with your health care provider about having an MRI and a mammogram every year.  Breast cancer (BRCA) gene test is recommended for women who have family members with BRCA-related cancers. Results of the assessment will determine the need for genetic counseling and BRCA1 and for BRCA2 testing. BRCA-related cancers include these types:  Breast. This occurs in males or females.  Ovarian.  Tubal. This may also be called fallopian tube cancer.  Cancer of the abdominal or pelvic lining (peritoneal cancer).  Prostate.  Pancreatic. Cervical, Uterine, and Ovarian Cancer Your health care provider may recommend that you be screened regularly for cancer of the pelvic organs. These include your ovaries, uterus, and vagina. This screening involves a pelvic exam, which includes checking for microscopic changes to the surface of your cervix (Pap test).  For women ages 59-65, health care providers may recommend a pelvic exam and a Pap test every three years. For women ages 59-65, they may recommend the Pap test and pelvic exam, combined with testing for human papilloma virus (HPV), every five years. Some types of HPV increase your risk of cervical cancer. Testing for HPV may also be done on women of any age who have unclear Pap test results.  Other health care providers may not recommend any screening for nonpregnant women who are considered low risk for pelvic cancer and have no symptoms. Ask your health care provider if a screening pelvic exam is right for you.  If you have had past treatment for cervical cancer or a condition that could lead to cancer, you need Pap tests and screening for cancer for at least 20 years after your treatment. If Pap tests have been discontinued for you, your risk factors (such as having a new sexual  partner) need to be reassessed to determine if you should start having screenings again. Some women have medical problems that increase the chance of getting cervical cancer. In these cases, your health care provider may recommend that you have screening and Pap tests more often.  If you have a family history of uterine cancer or ovarian cancer, talk with your health care provider about genetic screening.  If you have vaginal bleeding after reaching menopause, tell your health care provider.  There are currently no reliable tests available to screen for ovarian cancer. Lung Cancer Lung cancer screening is recommended for adults 72-31 years old who are at high risk for lung cancer because of a history of smoking. A yearly low-dose CT scan of the lungs is recommended if you:  Currently smoke.  Have a history of at least 30 pack-years of smoking and you currently smoke or have quit within the past 15 years. A pack-year is smoking an average of one pack of cigarettes per day for one year. Yearly screening should:  Continue until it has been 15 years since you quit.  Stop if you develop a health problem that would prevent you from having lung cancer treatment. Colorectal Cancer  This type of cancer can be detected and can often be prevented.  Routine colorectal cancer  screening usually begins at age 31 and continues through age 1.  If you have risk factors for colon cancer, your health care provider may recommend that you be screened at an earlier age.  If you have a family history of colorectal cancer, talk with your health care provider about genetic screening.  Your health care provider may also recommend using home test kits to check for hidden blood in your stool.  A small camera at the end of a tube can be used to examine your colon directly (sigmoidoscopy or colonoscopy). This is done to check for the earliest forms of colorectal cancer.  Direct examination of the colon should be  repeated every 5-10 years until age 77. However, if early forms of precancerous polyps or small growths are found or if you have a family history or genetic risk for colorectal cancer, you may need to be screened more often. Skin Cancer  Check your skin from head to toe regularly.  Monitor any moles. Be sure to tell your health care provider:  About any new moles or changes in moles, especially if there is a change in a mole's shape or color.  If you have a mole that is larger than the size of a pencil eraser.  If any of your family members has a history of skin cancer, especially at a young age, talk with your health care provider about genetic screening.  Always use sunscreen. Apply sunscreen liberally and repeatedly throughout the day.  Whenever you are outside, protect yourself by wearing long sleeves, pants, a wide-brimmed hat, and sunglasses. WHAT SHOULD I KNOW ABOUT OSTEOPOROSIS? Osteoporosis is a condition in which bone destruction happens more quickly than new bone creation. After menopause, you may be at an increased risk for osteoporosis. To help prevent osteoporosis or the bone fractures that can happen because of osteoporosis, the following is recommended:  If you are 25-45 years old, get at least 1,000 mg of calcium and at least 600 mg of vitamin D per day.  If you are older than age 75 but younger than age 4, get at least 1,200 mg of calcium and at least 600 mg of vitamin D per day.  If you are older than age 94, get at least 1,200 mg of calcium and at least 800 mg of vitamin D per day. Smoking and excessive alcohol intake increase the risk of osteoporosis. Eat foods that are rich in calcium and vitamin D, and do weight-bearing exercises several times each week as directed by your health care provider. WHAT SHOULD I KNOW ABOUT HOW MENOPAUSE AFFECTS [place]? Depression may occur at any age, but it is more common as you become older. Common symptoms of depression  include:  Low or sad mood.  Changes in sleep patterns.  Changes in appetite or eating patterns.  Feeling an overall lack of motivation or enjoyment of activities that you previously enjoyed.  Frequent crying spells. Talk with your health care provider if you think that you are experiencing depression. WHAT SHOULD I KNOW ABOUT IMMUNIZATIONS? It is important that you get and maintain your immunizations. These include:  Tetanus, diphtheria, and pertussis (Tdap) booster vaccine.  Influenza every year before the flu season begins.  Pneumonia vaccine.  Shingles vaccine. Your health care provider may also recommend other immunizations.   This information is not intended to replace advice given to you by your health care provider. Make sure you discuss any questions you have with your health care provider.  Document Released: 05/09/2005 Document Revised: 04/07/2014 Document Reviewed: 11/17/2013 Elsevier Interactive Patient Education [employer].

## 2015-03-14 NOTE — Progress Notes (Signed)
Pre-visit discussion using our clinic review tool. No additional management support is needed unless otherwise documented below in the visit note.  

## 2015-03-15 ENCOUNTER — Ambulatory Visit: Payer: Medicare Other | Admitting: Physical Therapy

## 2015-03-15 ENCOUNTER — Encounter: Payer: Self-pay | Admitting: Physical Therapy

## 2015-03-15 DIAGNOSIS — R262 Difficulty in walking, not elsewhere classified: Secondary | ICD-10-CM

## 2015-03-15 DIAGNOSIS — R531 Weakness: Secondary | ICD-10-CM

## 2015-03-15 DIAGNOSIS — R5381 Other malaise: Secondary | ICD-10-CM

## 2015-03-15 NOTE — Therapy (Signed)
[facility] [address] [place], [place], [address] Phone: [phone]   Fax:  [phone]  Physical Therapy Treatment  Patient Details  Name: Lauren [place] MRN: [id number] Date of Birth: 12-Mar-1956 Referring Provider: Dr. Volanda Napoleon  Encounter Date: 03/15/2015      PT End of Session - 03/15/15 1020    Visit Number 5   Number of Visits 8   Date for PT Re-Evaluation 03/29/15   Authorization - Visit Number 5   Authorization - Number of Visits 10   PT Start Time 0900   PT Stop Time 0951   PT Time Calculation (min) 51 min   Activity Tolerance Patient tolerated treatment well;No increased pain;Patient limited by fatigue   Behavior During Therapy [facility] for tasks assessed/performed      Past Medical History  Diagnosis Date  . Congenital absence of one kidney   . History of sciatica   . Depression     treated at [place]  . Hypertension   . hypothyrodism   . COPD (chronic obstructive pulmonary disease) ([place])   . Anxiety     Past Surgical History  Procedure Laterality Date  . Combined hysterectomy abdominal w/ a&p repair / oophorectomy  1996    benign tumor  . Inner ear surgery      bilateral  . Abdominal hysterectomy    . Breast biopsy Right 2011    [facility]< benign    There were no vitals filed for this visit.  Visit Diagnosis:  Difficulty walking  Generalized weakness  Physical deconditioning      Subjective Assessment - 03/15/15 1020    Subjective Pt reports having difficulty breathing due to the cold weather and wind. Pt O2 sat at 94% and above. Pt denies any pain.    Limitations Walking;House hold activities   Patient Stated Goals get back to PLOF/back to do like she used to with house work without being tired/walking program/walk without AD   Currently in Pain? No/denies       OBJECTIVE: there ex: Nustep L6 10 mins (no charge). Walking with hip flexion in // bars with B UE support x 6 with 2# ankle weights.  Decreased hip flexion noted on R. Toe walking x 4 in // bars with increased complaints of difficulty clearing L heel with 2# weights. Standing toe raises x 30 with 2# ankle weights. LAQ x 10 each leg with 2# ankle weights. Yellow theraband: scapular retraction and tricep extension x 20 each. Verbal cues required throughout for proper form. Step ups onto 3" step with airex on to x 30. Gait with verbal cues for increased heel strike and upright posture. Neuro re-ed: Obstacle course with step ups, balance, weaving, cone tapping while SLS x 4 with use of SPC. Pt required verbal cues throughout for each step.  Pt response to tx for medical necessity: Pt benefits from generalized strengthening and balance. Pt demonstrates increased ankle compensation for balance and benefits from balance training strategies.        PT Long Term Goals - 03/01/15 1239    PT LONG TERM GOAL #1   Title Pt will improve BERG balance score to > 50/56 in order to safely ambulate household distances without use of an AD.   Baseline 43/56   Time 4   Period Weeks   Status New   PT LONG TERM GOAL #2   Title Pt will improve LEFS to > 65/80 in order to report the ability to perform  household chores without increased fatigue.   Time 4   Period Weeks   Status New   PT LONG TERM GOAL #3   Title Pt will be independent with HEP in order to increase L hip flexion strength by 1/2 MMT grade in order to ambulate safely.   Baseline 3/5    Time 4   Period Weeks   Status New   PT LONG TERM GOAL #4   Title Pt will consistently ambulate community distances with increased cadence and no LOB noted with least restrictive AD.   Time 4   Period Weeks   Status New               Plan - 03/15/15 1020    Clinical Impression Statement Pt requires sitting rest breaks after every activity. Pt is able to tolerate standing there ex with 2# ankle weights. Pt demonstrates decreased confidence in her balance with obstacle course. Pt  demonstrates decreased cadence with obstacle course and uncertainity with single leg stance while cone tapping. Pt ambulates with khyphotic posture and decreased heel strike throughout all activities.    Pt will benefit from skilled therapeutic intervention in order to improve on the following deficits Abnormal gait;Decreased activity tolerance;Decreased balance;Decreased mobility;Decreased strength;Postural dysfunction;Improper body mechanics;Decreased endurance;Difficulty walking;Decreased range of motion   Rehab Potential Fair   Clinical Impairments Affecting Rehab Potential COPD and other co-morbidities   PT Frequency 2x / week   PT Duration 4 weeks   PT Treatment/Interventions ADLs/Self Care Home Management;Cryotherapy;Moist Heat;Balance training;Therapeutic exercise;Therapeutic activities;Manual techniques;Functional mobility training;Stair training;Gait training;Neuromuscular re-education;Patient/family education   PT Next Visit Plan strengthening/reasses BERG/single leg exercises/balance activities.   PT Home Exercise Plan see handouts   Consulted and Agree with Plan of Care Patient        Problem List Patient Active Problem List   Diagnosis Date Noted  . Encounter for preventive health examination 03/14/2015  . Breast cancer screening 03/14/2015  . Need for hepatitis C screening test 03/14/2015  . COPD with hypoxia ([place]) 02/16/2015  . COPD exacerbation ([place]) 02/03/2015  . Vitamin D deficiency 01/11/2015  . S/P hysterectomy with oophorectomy 09/22/2013  . Pulmonary hypertension ([place]) 07/14/2013  . Edema 01/12/2013  . Hypothyroidism 05/04/2012  . Severe mitral regurgitation by prior echocardiogram 01/11/2012  . Obesity (BMI 30-39.9) 08/04/2011  . Depression   . Hypertension   . Congenital absence of one kidney   . History of sciatica     [name], SPT 03/15/2015, 10:22 AM  [facility] [address] [place], [place], [address] Phone: [phone]   Fax:  [phone]  Name: [place] MRN: [id number] Date of Birth: [birth year]

## 2015-03-16 NOTE — Progress Notes (Signed)
Patient ID: Lauren Bartlett, female    DOB: [birth year]  Age: 59 y.o. MRN: [id number]  The patient is here for annual non gyn wellness examination and management of other chronic and acute problems.   The risk factors are reflected in the social history.  The roster of all physicians providing medical care to patient - is listed in the Snapshot section of the chart.  Activities of daily living:  The patient is 100% independent in all ADLs: dressing, toileting, feeding as well as independent mobility  Home safety : The patient has smoke detectors in the home. They wear seatbelts.  There are no firearms at home. There is no violence in the home.   There is no risks for hepatitis, STDs or HIV. There is no   history of blood transfusion. They have no travel history to infectious disease endemic areas of the world.  The patient has seen their dentist in the last six month. They have seen their eye doctor in the last year. They admit to slight hearing difficulty with regard to whispered voices and some television programs.  They have deferred audiologic testing in the last year.  They do not  have excessive sun exposure. Discussed the need for sun protection: hats, long sleeves and use of sunscreen if there is significant sun exposure.   Diet: the importance of a healthy diet is discussed. They do have a healthy diet.  The benefits of regular aerobic exercise were discussed. She walks 4 times per week ,  20 minutes.   Depression screen: there are no signs or vegative symptoms of depression- irritability, change in appetite, anhedonia, sadness/tearfullness.  Cognitive assessment: the patient manages all their financial and personal affairs and is actively engaged. They could relate day,date,year and events; recalled 2/3 objects at 3 minutes; performed clock-face test normally.  The following portions of the patient's history were reviewed and updated as appropriate: allergies, current medications, past  family history, past medical history,  past surgical history, past social history  and problem list.  Visual acuity was not assessed per patient preference since she has regular follow up with her ophthalmologist. Hearing and body mass index were assessed and reviewed.   During the course of the visit the patient was educated and counseled about appropriate screening and preventive services including : fall prevention , diabetes screening, nutrition counseling, colorectal cancer screening, and recommended immunizations.    CC: The primary encounter diagnosis was Vitamin D deficiency. Diagnoses of Encounter for preventive health examination, Hypothyroidism due to acquired atrophy of thyroid, Obesity (BMI 30-39.9), Essential hypertension, Breast cancer screening, Need for hepatitis C screening test, and Other specified hypothyroidism were also pertinent to this visit.  History [place] has a past medical history of Congenital absence of one kidney; History of sciatica; Depression; Hypertension; hypothyrodism; COPD (chronic obstructive pulmonary disease) ([place]); and Anxiety.   She has past surgical history that includes Combined hysterectomy abdominal w/ A&P repair / oophorectomy (1996); Inner ear surgery; Abdominal hysterectomy; and Breast biopsy (Right, 2011).   Her family history includes Breast cancer in her paternal aunt; Coronary artery disease in her father; Heart disease in her brother; Multiple sclerosis in her mother.She reports that she has never smoked. She has never used smokeless tobacco. She reports that she does not drink alcohol or use illicit drugs.  Outpatient Prescriptions Prior to Visit  Medication Sig Dispense Refill  . albuterol (PROVENTIL HFA;VENTOLIN HFA) 108 (90 BASE) MCG/ACT inhaler Inhale 2 puffs into the lungs every 6 (six) hours  as needed for wheezing or shortness of breath. WITH A SPACER 1 Inhaler 0  . calcium-vitamin D (OSCAL WITH D) 500-200 MG-UNIT per tablet Take 1 tablet  by mouth daily with breakfast. 90 tablet 2  . ergocalciferol (DRISDOL) 50000 UNITS capsule Take 1 capsule (50,000 Units total) by mouth once a week. 4 capsule 0  . hydrochlorothiazide (HYDRODIURIL) 25 MG tablet Take 1 tablet (25 mg total) by mouth daily. 30 tablet 5  . levocetirizine (XYZAL) 5 MG tablet Take 1 tablet (5 mg total) by mouth every evening. 30 tablet 5  . levothyroxine (SYNTHROID, LEVOTHROID) 112 MCG tablet TAKE 1 TABLET BY MOUTH EVERY DAY 30 tablet 9  . loratadine-pseudoephedrine (CLARITIN-D 24-HOUR) 10-240 MG 24 hr tablet Take 1 tablet by mouth daily.    [name] losartan (COZAAR) 100 MG tablet TAKE 1 TABLET BY MOUTH DAILY 30 tablet 5  . Umeclidinium-Vilanterol (ANORO ELLIPTA) 62.5-25 MCG/INH AEPB Inhale 1 puff into the lungs daily.     No facility-administered medications prior to visit.    Review of Systems   Patient denies headache, fevers, malaise, unintentional weight loss, skin rash, eye pain, sinus congestion and sinus pain, sore throat, dysphagia,  hemoptysis , cough, dyspnea, wheezing, chest pain, palpitations, orthopnea, edema, abdominal pain, nausea, melena, diarrhea, constipation, flank pain, dysuria, hematuria, urinary  Frequency, nocturia, numbness, tingling, seizures,  Focal weakness, Loss of consciousness,  Tremor, insomnia, depression, anxiety, and suicidal ideation.      Objective:  BP 108/70 mmHg  Pulse 72  Temp(Src) 97.6 F (36.4 C) (Oral)  Resp 12  Ht 5\' 2"  (1.575 m)  Wt 197 lb 8 oz (89.585 kg)  BMI 36.11 kg/m2  SpO2 97%  Physical Exam  General appearance: alert, cooperative and appears stated age Head: Normocephalic, without obvious abnormality, atraumatic Eyes: conjunctivae/corneas clear. PERRL, EOM's intact. Fundi benign. Ears: normal TM's and external ear canals both ears Nose: Nares normal. Septum midline. Mucosa normal. No drainage or sinus tenderness. Throat: lips, mucosa, and tongue normal; teeth and gums normal Neck: no adenopathy, no carotid  bruit, no JVD, supple, symmetrical, trachea midline and thyroid not enlarged, symmetric, no tenderness/mass/nodules Lungs: clear to auscultation bilaterally Breasts: normal appearance, no masses or tenderness Heart: regular rate and rhythm, S1, S2 normal, no murmur, click, rub or gallop Abdomen: soft, non-tender; bowel sounds normal; no masses,  no organomegaly Extremities: extremities normal, atraumatic, no cyanosis or edema Pulses: 2+ and symmetric Skin: Skin color, texture, turgor normal. No rashes or lesions Neurologic: Alert and oriented X 3, normal strength and tone. Normal symmetric reflexes. Normal coordination and gait.    Assessment & Plan:   Problem List Items Addressed This Visit    Encounter for preventive health examination    Annual comprehensive preventive exam was done as well as an evaluation and management of acute and chronic conditions .  During the course of the visit the patient was educated and counseled about appropriate screening and preventive services including :  diabetes screening, lipid analysis with projected  10 year  risk for CAD , nutrition counseling, colorectal cancer screening, and recommended immunizations.  Printed recommendations for health maintenance screenings was given.       Breast cancer screening   Need for hepatitis C screening test   Obesity (BMI 30-39.9)   Vitamin D deficiency - Primary   Hypertension   Hypothyroidism      I am having Ms. Szeto maintain her albuterol, Umeclidinium-Vilanterol, calcium-vitamin D, levocetirizine, ergocalciferol, levothyroxine, losartan, hydrochlorothiazide, and loratadine-pseudoephedrine.  No orders of  the defined types were placed in this encounter.    There are no discontinued medications.  Follow-up: No Follow-up on file.   [name], MD

## 2015-03-16 NOTE — Assessment & Plan Note (Signed)
Annual comprehensive preventive exam was done as well as an evaluation and management of acute and chronic conditions .  During the course of the visit the patient was educated and counseled about appropriate screening and preventive services including :  diabetes screening, lipid analysis with projected  10 year  risk for CAD , nutrition counseling,  colorectal cancer screening, and recommended immunizations.  Printed recommendations for health maintenance screenings was given.  

## 2015-03-16 NOTE — Addendum Note (Signed)
Addended by: [name] on: 03/16/2015 11:54 PM   Modules accepted: Miquel Dunn

## 2015-03-18 MED ORDER — ERGOCALCIFEROL 1.25 MG (50000 UT) PO CAPS: 50000.0000 [IU] | ORAL_CAPSULE | ORAL | Status: DC

## 2015-03-18 NOTE — Addendum Note (Signed)
Addended by: [name] on: 03/18/2015 03:49 PM   Modules accepted: Orders, SmartSet

## 2015-03-18 NOTE — Assessment & Plan Note (Signed)
Recurrent,  Resume weekly Drisdol.

## 2015-03-20 ENCOUNTER — Ambulatory Visit: Payer: Medicare Other | Admitting: Physical Therapy

## 2015-03-20 DIAGNOSIS — R531 Weakness: Secondary | ICD-10-CM

## 2015-03-20 DIAGNOSIS — R262 Difficulty in walking, not elsewhere classified: Secondary | ICD-10-CM

## 2015-03-20 DIAGNOSIS — R5381 Other malaise: Secondary | ICD-10-CM

## 2015-03-21 ENCOUNTER — Encounter: Payer: Self-pay | Admitting: Physical Therapy

## 2015-03-21 NOTE — Therapy (Signed)
[facility] [address] [place], [place], [address] Phone: [phone]   Fax:  [phone]  Physical Therapy Treatment  Patient Details  Name: Lauren Bartlett MRN: [id number] Date of Birth: 05-08-1955 Referring Provider: Dr. Volanda Napoleon  Encounter Date: 03/20/2015      PT End of Session - 03/21/15 0714    Visit Number 6   Number of Visits 8   Date for PT Re-Evaluation 03/29/15   Authorization - Visit Number 6   Authorization - Number of Visits 10   PT Start Time 1638   PT Stop Time 0950   PT Time Calculation (min) 53 min   Activity Tolerance Patient tolerated treatment well;No increased pain   Behavior During Therapy [facility] for tasks assessed/performed      Past Medical History  Diagnosis Date  . Congenital absence of one kidney   . History of sciatica   . Depression     treated at [place]  . Hypertension   . hypothyrodism   . COPD (chronic obstructive pulmonary disease) ([place])   . Anxiety     Past Surgical History  Procedure Laterality Date  . Combined hysterectomy abdominal w/ a&p repair / oophorectomy  1996    benign tumor  . Inner ear surgery      bilateral  . Abdominal hysterectomy    . Breast biopsy Right 2011    [facility]< benign    There were no vitals filed for this visit.  Visit Diagnosis:  Difficulty walking  Physical deconditioning  Generalized weakness      Subjective Assessment - 03/21/15 0713    Subjective Pt reports not resting well lst night due to difficulty with breathing. Pt has no complaints of pain in her legs upon arrival.    Limitations Walking;House hold activities   Patient Stated Goals get back to PLOF/back to do like she used to with house work without being tired/walking program/walk without AD   Currently in Pain? No/denies       OBJECTIVE: There ex: In the // bars: gait forwards and backwards with one UE light touch. Marching in // bars with minimal UE support. Forward and side  step overs 3" step while walking in // Bars. 6" step up forwards and sideways x 30 each direction. Tandem walking in the // bars. Side stepping in the // bars. Nustep to cool down.  Pt response to tx for medical necessity: Pt benefits from generalized strengthening and conditioning to improve overall function.  O2 sat throughout > 94%.       PT Long Term Goals - 03/01/15 1239    PT LONG TERM GOAL #1   Title Pt will improve BERG balance score to > 50/56 in order to safely ambulate household distances without use of an AD.   Baseline 43/56   Time 4   Period Weeks   Status New   PT LONG TERM GOAL #2   Title Pt will improve LEFS to > 65/80 in order to report the ability to perform household chores without increased fatigue.   Time 4   Period Weeks   Status New   PT LONG TERM GOAL #3   Title Pt will be independent with HEP in order to increase L hip flexion strength by 1/2 MMT grade in order to ambulate safely.   Baseline 3/5    Time 4   Period Weeks   Status New   PT LONG TERM GOAL #4   Title Pt will  consistently ambulate community distances with increased cadence and no LOB noted with least restrictive AD.   Time 4   Period Weeks   Status New            Plan - 03/21/15 8502    Clinical Impression Statement Pt demonstrates an increase in activity tolerance with decrease in frequency of rest breaks. Pt is able to ambulate in the // bars with increased cadence and one UE only. Pt has difficulty with R hip stepping over forward and backwards. Pt continues to progress and shows compliance in HEP.   Pt will benefit from skilled therapeutic intervention in order to improve on the following deficits Abnormal gait;Decreased activity tolerance;Decreased balance;Decreased mobility;Decreased strength;Postural dysfunction;Improper body mechanics;Decreased endurance;Difficulty walking;Decreased range of motion   Rehab Potential Fair   Clinical Impairments Affecting Rehab Potential COPD and  other co-morbidities   PT Frequency 2x / week   PT Duration 4 weeks   PT Treatment/Interventions ADLs/Self Care Home Management;Cryotherapy;Moist Heat;Balance training;Therapeutic exercise;Therapeutic activities;Manual techniques;Functional mobility training;Stair training;Gait training;Neuromuscular re-education;Patient/family education   PT Next Visit Plan strengthening/reasses BERG/single leg exercises/balance activities.   PT Home Exercise Plan see handouts   Consulted and Agree with Plan of Care Patient        Problem List Patient Active Problem List   Diagnosis Date Noted  . Encounter for preventive health examination 03/14/2015  . Breast cancer screening 03/14/2015  . Need for hepatitis C screening test 03/14/2015  . COPD with hypoxia ([place]) 02/16/2015  . COPD exacerbation ([place]) 02/03/2015  . Vitamin D deficiency 01/11/2015  . S/P hysterectomy with oophorectomy 09/22/2013  . Pulmonary hypertension ([place]) 07/14/2013  . Edema 01/12/2013  . Hypothyroidism 05/04/2012  . Severe mitral regurgitation by prior echocardiogram 01/11/2012  . Obesity (BMI 30-39.9) 08/04/2011  . Depression   . Hypertension   . Congenital absence of one kidney   . History of sciatica     [name], SPT 03/21/2015, 7:22 AM  [facility] [address] [place], [place], [address] Phone: [phone]   Fax:  [phone]  Name: Lauren Bartlett MRN: [id number] Date of Birth: [birth year]

## 2015-03-22 ENCOUNTER — Encounter: Payer: Self-pay | Admitting: Physical Therapy

## 2015-03-22 ENCOUNTER — Ambulatory Visit: Payer: Medicare Other | Admitting: Physical Therapy

## 2015-03-22 DIAGNOSIS — R262 Difficulty in walking, not elsewhere classified: Secondary | ICD-10-CM

## 2015-03-22 DIAGNOSIS — R5381 Other malaise: Secondary | ICD-10-CM

## 2015-03-22 DIAGNOSIS — R531 Weakness: Secondary | ICD-10-CM

## 2015-03-22 NOTE — Therapy (Signed)
[facility] [address] [place], [place], [address] Phone: [phone]   Fax:  [phone]  Physical Therapy Treatment  Patient Details  Name: Lauren Bartlett MRN: [id number] Date of Birth: Mar 21, 1956 Referring Provider: Dr. Volanda Napoleon  Encounter Date: 03/22/2015      PT End of Session - 03/22/15 1353    Visit Number 7   Number of Visits 8   Date for PT Re-Evaluation 03/29/15   Authorization - Visit Number 7   Authorization - Number of Visits 10   PT Start Time [id number]   PT Stop Time 0950   PT Time Calculation (min) 53 min   Activity Tolerance Patient tolerated treatment well;No increased pain   Behavior During Therapy [facility] for tasks assessed/performed      Past Medical History  Diagnosis Date  . Congenital absence of one kidney   . History of sciatica   . Depression     treated at [place]  . Hypertension   . hypothyrodism   . COPD (chronic obstructive pulmonary disease) ([place])   . Anxiety     Past Surgical History  Procedure Laterality Date  . Combined hysterectomy abdominal w/ a&p repair / oophorectomy  1996    benign tumor  . Inner ear surgery      bilateral  . Abdominal hysterectomy    . Breast biopsy Right 2011    [facility]< benign    There were no vitals filed for this visit.  Visit Diagnosis:  Difficulty walking  Physical deconditioning  Generalized weakness      Subjective Assessment - 03/22/15 1349    Subjective Pt reports difficulty breathing and sleeping overnight. Pt reports no pain.   Limitations Walking;House hold activities   Patient Stated Goals get back to PLOF/back to do like she used to with house work without being tired/walking program/walk without AD   Currently in Pain? No/denies       OBJECTIVE: There ex: Nustep L7 warm up (no charge). 3" step overs with no UE support x 20. Alternating toe taps on 3" step x 30 each leg with no UE support. Gait around the clinic carrying a clipboard to  simulate carrying a plate in her apartment with no AD and SBA assistance. Neuro re-ed: Obstacle course to work on balance and fluid step pattern while navigating unlevel terrain. Decrease cadence noted and verbal cues required to perform proper sequencing.   Pt response to tx for medical necessity: Pt benefits from strengthening and balance to increase independence with functional mobility. Pt is able to ambulate with SPC but prefers to ambulate with her RW.         PT Long Term Goals - 03/01/15 1239    PT LONG TERM GOAL #1   Title Pt will improve BERG balance score to > 50/56 in order to safely ambulate household distances without use of an AD.   Baseline 43/56   Time 4   Period Weeks   Status New   PT LONG TERM GOAL #2   Title Pt will improve LEFS to > 65/80 in order to report the ability to perform household chores without increased fatigue.   Time 4   Period Weeks   Status New   PT LONG TERM GOAL #3   Title Pt will be independent with HEP in order to increase L hip flexion strength by 1/2 MMT grade in order to ambulate safely.   Baseline 3/5    Time 4   Period  Weeks   Status New   PT LONG TERM GOAL #4   Title Pt will consistently ambulate community distances with increased cadence and no LOB noted with least restrictive AD.   Time 4   Period Weeks   Status New             Plan - 03/22/15 1356    Clinical Impression Statement Pt maintains good O2 sat at or above 94% throughout PT tx. Pt demonstrates safe balance for short distances with no AD with SBA while carrying a clipboard to simulate carrying a plate at home. Pt demonstrates good obstacle navigation with a decreased cadence around obstacles. Pt is able to perform 3" step overs without UE support without diffiuclty. Pt navigates steps with a step over step gait pattern and B handrails. Pt demonstrates slow but steady progress towards goals.   Pt will benefit from skilled therapeutic intervention in order to improve on  the following deficits Abnormal gait;Decreased activity tolerance;Decreased balance;Decreased mobility;Decreased strength;Postural dysfunction;Improper body mechanics;Decreased endurance;Difficulty walking;Decreased range of motion   Rehab Potential Fair   Clinical Impairments Affecting Rehab Potential COPD and other co-morbidities   PT Frequency 2x / week   PT Duration 4 weeks   PT Treatment/Interventions ADLs/Self Care Home Management;Cryotherapy;Moist Heat;Balance training;Therapeutic exercise;Therapeutic activities;Manual techniques;Functional mobility training;Stair training;Gait training;Neuromuscular re-education;Patient/family education   PT Next Visit Plan strengthening/reasses BERG/single leg exercises/balance activities.   PT Home Exercise Plan see handouts   Consulted and Agree with Plan of Care Patient        Problem List Patient Active Problem List   Diagnosis Date Noted  . Encounter for preventive health examination 03/14/2015  . Breast cancer screening 03/14/2015  . Need for hepatitis C screening test 03/14/2015  . COPD with hypoxia ([place]) 02/16/2015  . COPD exacerbation ([place]) 02/03/2015  . Vitamin D deficiency 01/11/2015  . S/P hysterectomy with oophorectomy 09/22/2013  . Pulmonary hypertension ([place]) 07/14/2013  . Edema 01/12/2013  . Hypothyroidism 05/04/2012  . Severe mitral regurgitation by prior echocardiogram 01/11/2012  . Obesity (BMI 30-39.9) 08/04/2011  . Depression   . Hypertension   . Congenital absence of one kidney   . History of sciatica     [name], SPT  03/22/2015, 2:00 PM  [facility] [address] [place], [place], [address] Phone: [phone]   Fax:  [phone]  Name: Lauren Bartlett MRN: [id number] Date of Birth: [birth year]

## 2015-03-27 ENCOUNTER — Encounter: Payer: Self-pay | Admitting: Physical Therapy

## 2015-03-27 ENCOUNTER — Ambulatory Visit: Payer: Medicare Other | Admitting: Physical Therapy

## 2015-03-27 DIAGNOSIS — R262 Difficulty in walking, not elsewhere classified: Secondary | ICD-10-CM | POA: Diagnosis not present

## 2015-03-27 DIAGNOSIS — R531 Weakness: Secondary | ICD-10-CM

## 2015-03-27 DIAGNOSIS — R5381 Other malaise: Secondary | ICD-10-CM

## 2015-03-27 NOTE — Therapy (Signed)
[facility] [address] [place], [place], [address] Phone: [phone]   Fax:  [phone]  Physical Therapy Treatment  Patient Details  Name: Lauren Bartlett MRN: [id number] Date of Birth: 12-22-1955 Referring Provider: Dr. Volanda Napoleon  Encounter Date: 03/27/2015      PT End of Session - 03/27/15 1051    Visit Number 8   Number of Visits 8   Date for PT Re-Evaluation 03/29/15   Authorization - Visit Number 8   Authorization - Number of Visits 10   PT Start Time [id number]   PT Stop Time 0945   PT Time Calculation (min) 48 min   Activity Tolerance Patient tolerated treatment well;No increased pain   Behavior During Therapy [facility] for tasks assessed/performed      Past Medical History  Diagnosis Date  . Congenital absence of one kidney   . History of sciatica   . Depression     treated at [place]  . Hypertension   . hypothyrodism   . COPD (chronic obstructive pulmonary disease) ([place])   . Anxiety     Past Surgical History  Procedure Laterality Date  . Combined hysterectomy abdominal w/ a&p repair / oophorectomy  1996    benign tumor  . Inner ear surgery      bilateral  . Abdominal hysterectomy    . Breast biopsy Right 2011    [facility]< benign    There were no vitals filed for this visit.  Visit Diagnosis:  Difficulty walking  Physical deconditioning  Generalized weakness      Subjective Assessment - 03/27/15 1049    Subjective Pt reports having to use her nebulizer last night in order to breathe. Pt states she is not having any problems with moving just with her breathing.    Limitations Walking;House hold activities   Patient Stated Goals get back to PLOF/back to do like she used to with house work without being tired/walking program/walk without AD   Currently in Pain? No/denies      OBJECTIVE: There ex: Nustep L7 warm up 6 mins (no charge). Step ups and step overs on 6" step with light UE support in // bars x 30.  Sideways step overs x 20 with minimal UE support on // bars. Neuro re-ed: Airex: Balance with eyes open normal base/ eyes open adducted stance 4 x 30 seconds each. Pt demonstrates increased postural sway with adducted stance and reaches for // bars to maintain balance. Attempted eyes closed on Airex and pt demonstrates inability to maintain posture without grabbing onto // bars. Pt states it feel odd on foam and doesn't trust her legs. Gait training: Ambulation around the gym and in the hallway with no AD. Pt requires constant verbal cues for toe clearance and normalized cadence.  Pt response to tx for medical necessity: Pt benefits from strengthening and balance to improve independence with functional mobility. Pt demonstrates good use of SPC and no AD for short distances in order to improve ADL function and quality of gait.         PT Long Term Goals - 03/01/15 1239    PT LONG TERM GOAL #1   Title Pt will improve BERG balance score to > 50/56 in order to safely ambulate household distances without use of an AD.   Baseline 43/56   Time 4   Period Weeks   Status New   PT LONG TERM GOAL #2   Title Pt will improve LEFS to > 65/80  in order to report the ability to perform household chores without increased fatigue.   Time 4   Period Weeks   Status New   PT LONG TERM GOAL #3   Title Pt will be independent with HEP in order to increase L hip flexion strength by 1/2 MMT grade in order to ambulate safely.   Baseline 3/5    Time 4   Period Weeks   Status New   PT LONG TERM GOAL #4   Title Pt will consistently ambulate community distances with increased cadence and no LOB noted with least restrictive AD.   Time 4   Period Weeks   Status New            Plan - 03/27/15 1051    Clinical Impression Statement Pt maintains O2 sat at or above 95% the entire treatment session. Pt reports one incidence of dizziness but pt's BP was 106/63. Pt demonstrates good single leg balance with tap ups  without UE support. Pt is able to ambulate without  AD but requires moderate verbal cues for consistent speed and toe clearance. On Airex, pt demonstrates good form and ability to maintain eyes open balance. Pt has difficulty with eyes closed balance on Airex.  Pt is progressing towards her goals with slow by steady progress seen with increased activity tolerance.    Pt will benefit from skilled therapeutic intervention in order to improve on the following deficits Abnormal gait;Decreased activity tolerance;Decreased balance;Decreased mobility;Decreased strength;Postural dysfunction;Improper body mechanics;Decreased endurance;Difficulty walking;Decreased range of motion   Rehab Potential Fair   Clinical Impairments Affecting Rehab Potential COPD and other co-morbidities   PT Frequency 2x / week   PT Duration 4 weeks   PT Treatment/Interventions ADLs/Self Care Home Management;Cryotherapy;Moist Heat;Balance training;Therapeutic exercise;Therapeutic activities;Manual techniques;Functional mobility training;Stair training;Gait training;Neuromuscular re-education;Patient/family education   PT Next Visit Plan strengthening/reasses BERG/single leg exercises/balance activities.  RECERT/ SCHEDULE NEXT APPT.   PT Home Exercise Plan see handouts   Consulted and Agree with Plan of Care Patient        Problem List Patient Active Problem List   Diagnosis Date Noted  . Encounter for preventive health examination 03/14/2015  . Breast cancer screening 03/14/2015  . Need for hepatitis C screening test 03/14/2015  . COPD with hypoxia ([place]) 02/16/2015  . COPD exacerbation ([place]) 02/03/2015  . Vitamin D deficiency 01/11/2015  . S/P hysterectomy with oophorectomy 09/22/2013  . Pulmonary hypertension ([place]) 07/14/2013  . Edema 01/12/2013  . Hypothyroidism 05/04/2012  . Severe mitral regurgitation by prior echocardiogram 01/11/2012  . Obesity (BMI 30-39.9) 08/04/2011  . Depression   . Hypertension   .  Congenital absence of one kidney   . History of sciatica    Pura Spice, PT, DPT # [id number]   03/27/2015, 2:27 PM  [facility] [address] [place], [place], [address] Phone: [phone]   Fax:  [phone]  Name: Lauren Bartlett MRN: [id number] Date of Birth: Jul 26, 1955

## 2015-03-29 ENCOUNTER — Encounter: Payer: Self-pay | Admitting: Physical Therapy

## 2015-03-29 ENCOUNTER — Ambulatory Visit: Payer: Medicare Other | Admitting: Physical Therapy

## 2015-03-29 DIAGNOSIS — R531 Weakness: Secondary | ICD-10-CM

## 2015-03-29 DIAGNOSIS — R262 Difficulty in walking, not elsewhere classified: Secondary | ICD-10-CM

## 2015-03-29 DIAGNOSIS — R5381 Other malaise: Secondary | ICD-10-CM

## 2015-03-29 NOTE — Therapy (Signed)
[facility] [address] [place], [place], [address] Phone: [phone]   Fax:  [phone]  Physical Therapy Treatment  Patient Details  Name: Lauren Bartlett MRN: [id number] Date of Birth: 12/17/1955 Referring Provider: Dr. Walsh  Encounter Date: 03/29/2015      PT End of Session - 03/29/15 1250    Visit Number 9   Number of Visits 16   Date for PT Re-Evaluation 04/26/15   Authorization - Visit Number 9   Authorization - Number of Visits 16   PT Start Time 0857   PT Stop Time 0950   PT Time Calculation (min) 53 min   Activity Tolerance Patient tolerated treatment well;No increased pain   Behavior During Therapy [facility] for tasks assessed/performed      Past Medical History  Diagnosis Date  . Congenital absence of one kidney   . History of sciatica   . Depression     treated [facility]  . Hypertension   . hypothyrodism   . COPD (chronic obstructive pulmonary disease) ([facility])   . Anxiety     Past Surgical History  Procedure Laterality Date  . Combined hysterectomy abdominal w/ a&p repair / oophorectomy  1996    benign tumor  . Inner ear surgery      bilateral  . Abdominal hysterectomy    . Breast biopsy Right 2011    [facility]< benign    There were no vitals filed for this visit.  Visit Diagnosis:  Difficulty walking  Physical deconditioning  Generalized weakness      Subjective Assessment - 03/29/15 1249    Subjective Pt reports R LE pain that started after walking yesterday. Pt states her breathing is doing ok but still interrupting her sleep.   Limitations Walking;House hold activities   Patient Stated Goals get back to PLOF/back to do like she used to with house work without being tired/walking program/walk without AD   Currently in Pain? Yes   Pain Score 2    Pain Location Leg   Pain Orientation Right        OBJECTIVE: There ex: Nustep LStanding hip 3 way with yellow TB x 20 each direction. Pt reports  pain in stance leg but is able to work through it. Weakness noted in stance side. Without moderate verbal cues, pt compensates with trunk lean. Step ups on 6" step x 20 with no increased complaints of pain. Gait training: ambulation in the hallway with emphasis on increased cadence and increase heel strike. Pt ambulates with SPC and decreased toe clearance without verbal cuing. Neuro re-ed: Balance on Airex, single leg stance, eyes closed and other components of the BERG reassessment.  Pt response to tx for medical necessity: Pt benefits from balance and gait training to increase independence with functional mobility. Pt benefits from strengthening in order to increase safety.        PT Long Term Goals - 03/29/15 1252    PT LONG TERM GOAL #1   Title Pt will improve BERG balance score to > 50/56 in order to safely ambulate household distances without use of an AD.   Baseline 43/56   Time 4   Period Weeks   Status Not Met   PT LONG TERM GOAL #2   Title Pt will improve LEFS to > 65/80 in order to report the ability to perform household chores without increased fatigue.   Baseline 48/80 on 12/29   Time 4   Period Weeks     Status Not Met   PT LONG TERM GOAL #3   Title Pt will be independent with HEP in order to increase L hip flexion strength by 1/2 MMT grade in order to ambulate safely.   Baseline 3/5    Time 4   Period Weeks   Status On-going   PT LONG TERM GOAL #4   Title Pt will consistently ambulate community distances with increased cadence and no LOB noted with least restrictive AD.   Time 4   Period Weeks   Status On-going   PT LONG TERM GOAL #5   Title Pt will perform a TUG in < 14 seconds without her SPC to ensure safety at home with functional mobility.   Time 4   Period Weeks   Status New               Plan - 03/29/15 1250    Clinical Impression Statement Pt's O2 sat was 98% post exercise and during exercise remanined at or above a 95%. Pt demonstrates safe  ambulation witha  decreased cadence with SPC but is fearful to use the cane outside of PT. Pt encouraged to use the cane around her apartment. Pt demonstrates good but slow progress towards goals.    Pt will benefit from skilled therapeutic intervention in order to improve on the following deficits Abnormal gait;Decreased activity tolerance;Decreased balance;Decreased mobility;Decreased strength;Postural dysfunction;Improper body mechanics;Decreased endurance;Difficulty walking;Decreased range of motion   Rehab Potential Fair   Clinical Impairments Affecting Rehab Potential COPD and other co-morbidities   PT Frequency 2x / week   PT Duration 4 weeks   PT Treatment/Interventions ADLs/Self Care Home Management;Cryotherapy;Moist Heat;Balance training;Therapeutic exercise;Therapeutic activities;Manual techniques;Functional mobility training;Stair training;Gait training;Neuromuscular re-education;Patient/family education   PT Next Visit Plan strengthening/reasses BERG/single leg exercises/balance activities.  RECERT/ SCHEDULE NEXT APPT.   PT Home Exercise Plan see handouts   Consulted and Agree with Plan of Care Patient        Problem List Patient Active Problem List   Diagnosis Date Noted  . Encounter for preventive health examination 03/14/2015  . Breast cancer screening 03/14/2015  . Need for hepatitis C screening test 03/14/2015  . COPD with hypoxia ([place]) 02/16/2015  . COPD exacerbation ([place]) 02/03/2015  . Vitamin D deficiency 01/11/2015  . S/P hysterectomy with oophorectomy 09/22/2013  . Pulmonary hypertension ([place]) 07/14/2013  . Edema 01/12/2013  . Hypothyroidism 05/04/2012  . Severe mitral regurgitation by prior echocardiogram 01/11/2012  . Obesity (BMI 30-39.9) 08/04/2011  . Depression   . Hypertension   . Congenital absence of one kidney   . History of sciatica     [name], SPT 03/29/2015, 12:55 PM  [facility] [address] [place], [place], [address] Phone: [phone]   Fax:  [phone]  Name: Lauren Bartlett MRN: [id number] Date of Birth: [birth year]

## 2015-03-31 ENCOUNTER — Other Ambulatory Visit: Payer: Self-pay | Admitting: Internal Medicine

## 2015-04-03 ENCOUNTER — Telehealth: Payer: Self-pay | Admitting: *Deleted

## 2015-04-03 NOTE — Telephone Encounter (Signed)
Refill was completed this morning.

## 2015-04-03 NOTE — Telephone Encounter (Signed)
Patient has requested a medication refill for losartan.

## 2015-04-04 ENCOUNTER — Ambulatory Visit: Payer: Medicare Other | Attending: Internal Medicine | Admitting: Physical Therapy

## 2015-04-04 DIAGNOSIS — R5381 Other malaise: Secondary | ICD-10-CM | POA: Insufficient documentation

## 2015-04-04 DIAGNOSIS — R262 Difficulty in walking, not elsewhere classified: Secondary | ICD-10-CM | POA: Diagnosis present

## 2015-04-04 DIAGNOSIS — R531 Weakness: Secondary | ICD-10-CM | POA: Diagnosis present

## 2015-04-04 NOTE — Therapy (Signed)
[facility] [address] [place], [place], [address] Phone: [phone]   Fax:  [phone]  Physical Therapy Treatment  Patient Details  Name: Lauren Bartlett MRN: [id number] Date of Birth: 1955/08/22 Referring Provider: Dr. Volanda Napoleon  Encounter Date: 04/04/2015      PT End of Session - 04/04/15 1102    Visit Number 10   Number of Visits 16   Date for PT Re-Evaluation 04/26/15   Authorization - Visit Number 10   Authorization - Number of Visits 19   PT Start Time 0175   PT Stop Time 0950   PT Time Calculation (min) 55 min   Activity Tolerance Patient tolerated treatment well;No increased pain   Behavior During Therapy [facility] for tasks assessed/performed      Past Medical History  Diagnosis Date  . Congenital absence of one kidney   . History of sciatica   . Depression     treated at [place]  . Hypertension   . hypothyrodism   . COPD (chronic obstructive pulmonary disease) ([place])   . Anxiety     Past Surgical History  Procedure Laterality Date  . Combined hysterectomy abdominal w/ a&p repair / oophorectomy  1996    benign tumor  . Inner ear surgery      bilateral  . Abdominal hysterectomy    . Breast biopsy Right 2011    [facility]< benign    There were no vitals filed for this visit.  Visit Diagnosis:  Difficulty walking  Physical deconditioning  Generalized weakness      Subjective Assessment - 04/04/15 1102    Subjective Pt reports not sleeping well and having problems with her breathing. Pt uses inhaler once during PT and reports feeling some better but unable to catch her breathe.    Limitations Walking;House hold activities   Patient Stated Goals get back to PLOF/back to do like she used to with house work without being tired/walking program/walk without AD   Currently in Pain? No/denies      OBJECTIVE: There ex: Nustep warm up no charge. Standing balance with ball throwing with no UE support. Neuro re-ed:  Balance on airex with functional reaching for cones. Airex balance with various bases of support. Gait training: Walking heel to toe forwards and backwards in // bars. Gait training for 6 MWT. Gait training over unlevel terrain with SPC and verbal cues for increased cadence and step length.   Pt response to tx for medical necessity: Pt benefits from balance and gait training to regain functional mobility with SPC. Pt benefits from strengthening to increase lower leg strength to help with ambulation.      PT Long Term Goals - 03/29/15 1252    PT LONG TERM GOAL #1   Title Pt will improve BERG balance score to > 50/56 in order to safely ambulate household distances without use of an AD.   Baseline 43/56   Time 4   Period Weeks   Status Not Met   PT LONG TERM GOAL #2   Title Pt will improve LEFS to > 65/80 in order to report the ability to perform household chores without increased fatigue.   Baseline 48/80 on 12/29   Time 4   Period Weeks   Status Not Met   PT LONG TERM GOAL #3   Title Pt will be independent with HEP in order to increase L hip flexion strength by 1/2 MMT grade in order to ambulate safely.   Baseline  3/5    Time 4   Period Weeks   Status On-going   PT LONG TERM GOAL #4   Title Pt will consistently ambulate community distances with increased cadence and no LOB noted with least restrictive AD.   Time 4   Period Weeks   Status On-going   PT LONG TERM GOAL #5   Title Pt will perform a TUG in < 14 seconds without her SPC to ensure safety at home with functional mobility.   Time 4   Period Weeks   Status New            Plan - 04/04/15 1102    Clinical Impression Statement Pt maintained consistent O2 sat at 97 or above. Pt reports having difficulty with breathing but consistently sat above 97%. Pt is able to navigate obstacle course with SPC with decreased cadence and moderate verbal cues. Pt is able to ambulate 476 ft in 6 mins with no rest breaks and with SPC. Pt  ambulates over unlevel ground with SPC and decreased cadence and step length.    Pt will benefit from skilled therapeutic intervention in order to improve on the following deficits Abnormal gait;Decreased activity tolerance;Decreased balance;Decreased mobility;Decreased strength;Postural dysfunction;Improper body mechanics;Decreased endurance;Difficulty walking;Decreased range of motion   Rehab Potential Fair   Clinical Impairments Affecting Rehab Potential COPD and other co-morbidities   PT Frequency 2x / week   PT Duration 4 weeks   PT Treatment/Interventions ADLs/Self Care Home Management;Cryotherapy;Moist Heat;Balance training;Therapeutic exercise;Therapeutic activities;Manual techniques;Functional mobility training;Stair training;Gait training;Neuromuscular re-education;Patient/family education   PT Next Visit Plan single leg exercise/balance/gait training/obstacle course/functional activities with [facility]   PT Home Exercise Plan see handouts   Consulted and Agree with Plan of Care Patient        Problem List Patient Active Problem List   Diagnosis Date Noted  . Encounter for preventive health examination 03/14/2015  . Breast cancer screening 03/14/2015  . Need for hepatitis C screening test 03/14/2015  . COPD with hypoxia ([place]) 02/16/2015  . COPD exacerbation ([place]) 02/03/2015  . Vitamin D deficiency 01/11/2015  . S/P hysterectomy with oophorectomy 09/22/2013  . Pulmonary hypertension ([place]) 07/14/2013  . Edema 01/12/2013  . Hypothyroidism 05/04/2012  . Severe mitral regurgitation by prior echocardiogram 01/11/2012  . Obesity (BMI 30-39.9) 08/04/2011  . Depression   . Hypertension   . Congenital absence of one kidney   . History of sciatica     [name], SPT 04/04/2015, 11:20 AM  [facility] [address] [place], [place], [address] Phone: [phone]   Fax:  [phone]  Name: Lauren Bartlett MRN: [id number] Date of  Birth: 02-02-1956

## 2015-04-06 ENCOUNTER — Emergency Department
Admission: EM | Admit: 2015-04-06 | Discharge: 2015-04-06 | Disposition: A | Discharge status: Home / Self Care | Payer: Medicare Other | Attending: Emergency Medicine | Admitting: Emergency Medicine

## 2015-04-06 ENCOUNTER — Emergency Department: Payer: Medicare Other

## 2015-04-06 ENCOUNTER — Encounter: Payer: Self-pay | Admitting: Emergency Medicine

## 2015-04-06 DIAGNOSIS — Z880 Allergy status to penicillin: Secondary | ICD-10-CM | POA: Insufficient documentation

## 2015-04-06 DIAGNOSIS — Z79899 Other long term (current) drug therapy: Secondary | ICD-10-CM | POA: Insufficient documentation

## 2015-04-06 DIAGNOSIS — J441 Chronic obstructive pulmonary disease with (acute) exacerbation: Secondary | ICD-10-CM | POA: Diagnosis not present

## 2015-04-06 DIAGNOSIS — I10 Essential (primary) hypertension: Secondary | ICD-10-CM | POA: Diagnosis not present

## 2015-04-06 DIAGNOSIS — Z792 Long term (current) use of antibiotics: Secondary | ICD-10-CM | POA: Diagnosis not present

## 2015-04-06 DIAGNOSIS — Z7951 Long term (current) use of inhaled steroids: Secondary | ICD-10-CM | POA: Diagnosis not present

## 2015-04-06 DIAGNOSIS — R000 Tachycardia, unspecified: Secondary | ICD-10-CM | POA: Insufficient documentation

## 2015-04-06 DIAGNOSIS — R0602 Shortness of breath: Secondary | ICD-10-CM | POA: Diagnosis present

## 2015-04-06 LAB — COMPREHENSIVE METABOLIC PANEL
ALT: 12 U/L — ABNORMAL LOW (ref 14–54)
ANION GAP: 9 (ref 5–15)
AST: 17 U/L (ref 15–41)
Albumin: 4 g/dL (ref 3.5–5.0)
Alkaline Phosphatase: 76 U/L (ref 38–126)
BILIRUBIN TOTAL: 0.7 mg/dL (ref 0.3–1.2)
BUN: 19 mg/dL (ref 6–20)
CHLORIDE: 103 mmol/L (ref 101–111)
CO2: 29 mmol/L (ref 22–32)
Calcium: 8.5 mg/dL — ABNORMAL LOW (ref 8.9–10.3)
Creatinine, Ser: 0.7 mg/dL (ref 0.44–1.00)
Glucose, Bld: 128 mg/dL — ABNORMAL HIGH (ref 65–99)
POTASSIUM: 3.4 mmol/L — AB (ref 3.5–5.1)
Sodium: 141 mmol/L (ref 135–145)
TOTAL PROTEIN: 7.4 g/dL (ref 6.5–8.1)

## 2015-04-06 LAB — CBC
HEMATOCRIT: 38.8 % (ref 35.0–47.0)
Hemoglobin: 12.5 g/dL (ref 12.0–16.0)
MCH: 28.6 pg (ref 26.0–34.0)
MCHC: 32.1 g/dL (ref 32.0–36.0)
MCV: 89 fL (ref 80.0–100.0)
PLATELETS: 157 10*3/uL (ref 150–440)
RBC: 4.36 MIL/uL (ref 3.80–5.20)
RDW: 15 % — AB (ref 11.5–14.5)
WBC: 9.6 10*3/uL (ref 3.6–11.0)

## 2015-04-06 LAB — TROPONIN I

## 2015-04-06 LAB — BRAIN NATRIURETIC PEPTIDE: B NATRIURETIC PEPTIDE 5: 38 pg/mL (ref 0.0–100.0)

## 2015-04-06 MED ORDER — AZITHROMYCIN 250 MG PO TABS
ORAL_TABLET | ORAL | Status: AC
Start: 2015-04-06 — End: 2015-04-11

## 2015-04-06 NOTE — ED Provider Notes (Signed)
[facility] Emergency Department Provider Note  ____________________________________________  Time seen:  10:10 AM  I have reviewed the triage vital signs and the nursing notes.  History by:    HISTORY  Chief Complaint Shortness of Breath   HPI Lauren Bartlett is a 60 y.o. female  with history of COPD. She currently has some shortness of breath. This been occurring for 2 days. She developed a dry hacking cough last night.   With her increased shortness of breath, she use an albuterol nebulizer at home and then called EMS. EMS gave her 2 additional DuoNebs. She continues to have wheezing and shortness of breath.  Patient reports that she saw her primary physician, Dr. Derrel Nip, in December. Patient reports that she is unable take prednisone as in the past it made her feel like she was "going crazy".  Patient is feeling better now. She reports should like to be put on antibiotic, similar to what she was placed on when she was seen at the emergency department in December (no record of this visit is seen).    Past Medical History  Diagnosis Date  . Congenital absence of one kidney   . History of sciatica   . Depression     treated at [place]  . Hypertension   . hypothyrodism   . COPD (chronic obstructive pulmonary disease) ([place])   . Anxiety     Patient Active Problem List   Diagnosis Date Noted  . Encounter for preventive health examination 03/14/2015  . Breast cancer screening 03/14/2015  . Need for hepatitis C screening test 03/14/2015  . COPD with hypoxia ([place]) 02/16/2015  . COPD exacerbation ([place]) 02/03/2015  . Vitamin D deficiency 01/11/2015  . S/P hysterectomy with oophorectomy 09/22/2013  . Pulmonary hypertension ([place]) 07/14/2013  . Edema 01/12/2013  . Hypothyroidism 05/04/2012  . Severe mitral regurgitation by prior echocardiogram 01/11/2012  . Obesity (BMI 30-39.9) 08/04/2011  . Depression   . Hypertension   . Congenital absence of one  kidney   . History of sciatica     Past Surgical History  Procedure Laterality Date  . Combined hysterectomy abdominal w/ a&p repair / oophorectomy  1996    benign tumor  . Inner ear surgery      bilateral  . Abdominal hysterectomy    . Breast biopsy Right 2011    [facility]< benign    Current Outpatient Rx  Name  Route  Sig  Dispense  Refill  . albuterol (PROVENTIL HFA;VENTOLIN HFA) 108 (90 BASE) MCG/ACT inhaler   Inhalation   Inhale 2 puffs into the lungs every 6 (six) hours as needed for wheezing or shortness of breath. WITH A SPACER   1 Inhaler   0   . calcium-vitamin D (OSCAL WITH D) 500-200 MG-UNIT per tablet   Oral   Take 1 tablet by mouth daily with breakfast.   90 tablet   2   . ergocalciferol (DRISDOL) 50000 UNITS capsule   Oral   Take 1 capsule (50,000 Units total) by mouth once a week.   12 capsule   0   . hydrochlorothiazide (HYDRODIURIL) 25 MG tablet   Oral   Take 1 tablet (25 mg total) by mouth daily.   30 tablet   5   . levocetirizine (XYZAL) 5 MG tablet   Oral   Take 1 tablet (5 mg total) by mouth every evening.   30 tablet   5   . levothyroxine (SYNTHROID, LEVOTHROID) 112 MCG tablet  TAKE 1 TABLET BY MOUTH EVERY DAY   30 tablet   9   . loratadine-pseudoephedrine (CLARITIN-D 24-HOUR) 10-240 MG 24 hr tablet   Oral   Take 1 tablet by mouth daily.         [name] losartan (COZAAR) 100 MG tablet      TAKE 1 TABLET BY MOUTH DAILY   30 tablet   0   . Umeclidinium-Vilanterol (ANORO ELLIPTA) 62.5-25 MCG/INH AEPB   Inhalation   Inhale 1 puff into the lungs daily.         [name] azithromycin (ZITHROMAX Z-PAK) 250 MG tablet      Take 2 tablets (500 mg) on  Day 1,  followed by 1 tablet (250 mg) once daily on Days 2 through 5.   6 each   0     Allergies Codeine; Penicillin g; and Prednisone  Family History  Problem Relation Age of Onset  . Multiple sclerosis Mother   . Coronary artery disease Father   . Heart disease Brother   . Breast  cancer Paternal Aunt     Social History Social History  Substance Use Topics  . Smoking status: Never Smoker   . Smokeless tobacco: Never Used  . Alcohol Use: No    Review of Systems  Constitutional: Negative for fever/chills. ENT: Negative for congestion. Cardiovascular: Negative for chest pain. Respiratory: Short of breath. See history of present illness Gastrointestinal: Negative for abdominal pain, vomiting and diarrhea. Genitourinary: Negative for dysuria. Musculoskeletal: No back pain. Skin: Negative for rash. Neurological: Negative for headache or focal weakness   10-point ROS otherwise negative.  ____________________________________________   PHYSICAL EXAM:  VITAL SIGNS: ED Triage Vitals  Enc Vitals Group     BP 04/06/15 0921 155/83 mmHg     Pulse Rate 04/06/15 0921 119     Resp 04/06/15 0921 22     Temp 04/06/15 0921 97.7 F (36.5 C)     Temp Source 04/06/15 0921 Oral     SpO2 04/06/15 0921 95 %     Weight 04/06/15 0921 200 lb (90.719 kg)     Height 04/06/15 0921 5\' 1"  (1.549 m)     Head Cir --      Peak Flow --      Pain Score 04/06/15 0922 0     Pain Loc --      Pain Edu? --      Excl. in [place]? --     Constitutional: Alert and oriented. Mild increase work of breathing but no distress. ENT   Head: Normocephalic and atraumatic.   Nose: No congestion/rhinnorhea.       Mouth: No erythema, no swelling   Cardiovascular: Tachycardic at 110, regular rhythm, no murmur noted Respiratory:  Normal respiratory effort, no tachypnea.    Mild wheezing bilaterally.  Gastrointestinal: Soft, no distention. Nontender Back: No muscle spasm, no tenderness, no CVA tenderness. Musculoskeletal: No deformity noted. Nontender with normal range of motion in all extremities.  No noted edema. Neurologic:  Communicative. Normal appearing spontaneous movement in all 4 extremities. No gross focal neurologic deficits are appreciated.  Skin:  Skin is warm, dry. No rash  noted. Psychiatric: Mood and affect are normal. Speech and behavior are normal.  ____________________________________________    LABS (pertinent positives/negatives)  Labs Reviewed  CBC - Abnormal; Notable for the following:    RDW 15.0 (*)    All other components within normal limits  COMPREHENSIVE METABOLIC PANEL - Abnormal; Notable for the following:  Potassium 3.4 (*)    Glucose, Bld 128 (*)    Calcium 8.5 (*)    ALT 12 (*)    All other components within normal limits  BRAIN NATRIURETIC PEPTIDE  TROPONIN I     ____________________________________________   EKG  ED ECG REPORT I, Batya Citron W, the attending physician, personally viewed and interpreted this ECG.   Date: 04/06/2015  EKG Time: 919  Rate: 1:15  Rhythm: Sinus tachycardia  Axis: Normal  Intervals: Normal  ST&T Change: None noted   ____________________________________________    RADIOLOGY  Chest x-ray:  FINDINGS: The heart size and mediastinal contours are within normal limits. Both lungs are clear. The visualized skeletal structures are unremarkable.  IMPRESSION: No active disease.  ____________________________________________   PROCEDURES    ____________________________________________   INITIAL IMPRESSION / ASSESSMENT AND PLAN / ED COURSE  Pertinent labs & imaging results that were available during my care of the patient were reviewed by me and considered in my medical decision making (see chart for details).  Anxious 60 year old female with shortness of breath earlier, now improved after multiple neb treatments. I've encouraged the patient to take low-dose prednisone, but she does not want to do this. We will place her on azithromycin, per her request for an antibiotic. Her lungs are clear this time and will discharge her home to follow-up with Dr. Derrel Nip.  The patient is somewhat tachycardic. She doesn't have any edema in either leg. She is no history of a DVT. Her oxygen  saturations level is quite good. The tachycardia is status post 3 neb treatments.  ____________________________________________   FINAL CLINICAL IMPRESSION(S) / ED DIAGNOSES  Final diagnoses:  COPD exacerbation ([place])      Ahmed Prima, MD 04/06/15 1504

## 2015-04-06 NOTE — Discharge Instructions (Signed)
Take azithromycin. Continue your nebulizers as needed. Follow-up with Dr. Derrel Nip. Return to the emergency department if you have worsening shortness of breath, fever, or other urgent concerns.  Chronic Obstructive Pulmonary Disease Chronic obstructive pulmonary disease (COPD) is a common lung problem. In COPD, the flow of air from the lungs is limited. The way your lungs work will probably never return to normal, but there are things you can do to improve your lungs and make yourself feel better. Your doctor may treat your condition with:  Medicines.  Oxygen.  Lung surgery.  Changes to your diet.  Rehabilitation. This may involve a team of specialists. HOME CARE  Take all medicines as told by your doctor.  Avoid medicines or cough syrups that dry up your airway (such as antihistamines) and do not allow you to get rid of thick spit. You do not need to avoid them if told differently by your doctor.  If you smoke, stop. Smoking makes the problem worse.  Avoid being around things that make your breathing worse (like smoke, chemicals, and fumes).  Use oxygen therapy and therapy to help improve your lungs (pulmonary rehabilitation) if told by your doctor. If you need home oxygen therapy, ask your doctor if you should buy a tool to measure your oxygen level (oximeter).  Avoid people who have a sickness you can catch (contagious).  Avoid going outside when it is very hot, cold, or humid.  Eat healthy foods. Eat smaller meals more often. Rest before meals.  Stay active, but remember to also rest.  Make sure to get all the shots (vaccines) your doctor recommends. Ask your doctor if you need a pneumonia shot.  Learn and use tips on how to relax.  Learn and use tips on how to control your breathing as told by your doctor. Try:  Breathing in (inhaling) through your nose for 1 second. Then, pucker your lips and breath out (exhale) through your lips for 2 seconds.  Putting one hand on your  belly (abdomen). Breathe in slowly through your nose for 1 second. Your hand on your belly should move out. Pucker your lips and breathe out slowly through your lips. Your hand on your belly should move in as you breathe out.  Learn and use controlled coughing to clear thick spit from your lungs. The steps are: 1. Lean your head a little forward. 2. Breathe in deeply. 3. Try to hold your breath for 3 seconds. 4. Keep your mouth slightly open while coughing 2 times. 5. Spit any thick spit out into a tissue. 6. Rest and do the steps again 1 or 2 times as needed. GET HELP IF:  You cough up more thick spit than usual.  There is a change in the color or thickness of the spit.  It is harder to breathe than usual.  Your breathing is faster than usual. GET HELP RIGHT AWAY IF:  You have shortness of breath while resting.  You have shortness of breath that stops you from:  Being able to talk.  Doing normal activities.  You chest hurts for longer than 5 minutes.  Your skin color is more blue than usual.  Your pulse oximeter shows that you have low oxygen for longer than 5 minutes. MAKE SURE YOU:  Understand these instructions.  Will watch your condition.  Will get help right away if you are not doing well or get worse.   This information is not intended to replace advice given to you by your health  care provider. Make sure you discuss any questions you have with your health care provider.   Document Released: 09/03/2007 Document Revised: 04/07/2014 Document Reviewed: 11/11/2012 Elsevier Interactive Patient Education [employer].

## 2015-04-06 NOTE — ED Notes (Addendum)
Patient arrives [facility] with c/o ShOB for 2 days worsening today. Patient self administered 1 albuterol neb and 1 tx of her MDI. EMS administered 3x duonebs and 4mg  ondanstron. Prednisone witheld due to allergy. Chest Pain and weakness

## 2015-04-10 ENCOUNTER — Encounter: Payer: Medicare Other | Admitting: Physical Therapy

## 2015-04-12 ENCOUNTER — Encounter: Payer: Self-pay | Admitting: Physical Therapy

## 2015-04-12 ENCOUNTER — Ambulatory Visit: Payer: Medicare Other | Admitting: Physical Therapy

## 2015-04-12 DIAGNOSIS — R5381 Other malaise: Secondary | ICD-10-CM

## 2015-04-12 DIAGNOSIS — R262 Difficulty in walking, not elsewhere classified: Secondary | ICD-10-CM | POA: Diagnosis not present

## 2015-04-12 DIAGNOSIS — R531 Weakness: Secondary | ICD-10-CM

## 2015-04-12 NOTE — Therapy (Signed)
[place] [facility] [address]. [place], [place], [address] Phone: [phone]   Fax:  [phone]  Physical Therapy Treatment  Patient Details  Name: Lauren Bartlett MRN: [id number] Date of Birth: 12/29/1955 Referring Provider: Dr. Volanda Napoleon  Encounter Date: 04/12/2015      PT End of Session - 04/12/15 1006    Visit Number 11   Number of Visits 16   Date for PT Re-Evaluation 04/26/15   Authorization - Visit Number 11   Authorization - Number of Visits 19   PT Start Time 0901   PT Stop Time 6004   PT Time Calculation (min) 54 min   Equipment Utilized During Treatment Gait belt   Activity Tolerance Patient tolerated treatment well;No increased pain   Behavior During Therapy [facility] for tasks assessed/performed      Past Medical History  Diagnosis Date  . Congenital absence of one kidney   . History of sciatica   . Depression     treated at [place]  . Hypertension   . hypothyrodism   . COPD (chronic obstructive pulmonary disease) ([place])   . Anxiety     Past Surgical History  Procedure Laterality Date  . Combined hysterectomy abdominal w/ a&p repair / oophorectomy  1996    benign tumor  . Inner ear surgery      bilateral  . Abdominal hysterectomy    . Breast biopsy Right 2011    [facility]< benign    There were no vitals filed for this visit.  Visit Diagnosis:  Physical deconditioning  Difficulty walking  Generalized weakness      Subjective Assessment - 04/12/15 1005    Subjective Pt reports coughing this morning. Pt states she walked some in the hallway over the snowy weekend. Pt denies any pain or new complaints upon arrival to PT tx session.    Limitations Walking;House hold activities   Patient Stated Goals get back to PLOF/back to do like she used to with house work without being tired/walking program/walk without AD   Currently in Pain? No/denies      OBJECTIVE: Warm up: Nustep L7 10 mins no charge. Neuro re-ed:  Obstacle course with step ups, balance, cone weaving, step overs x 4. Pt carried a bag with 5# weight to simulate carrying groceries in her apartment. Balance on Airex with no UE support x 30 seconds x 3. Pt demonstrates bouncy motion to maintain balance with weight shifts forwards and backwards. Gait training: Ambulation on unlevel ground with CGA and minimal verbal cuing for foot clearance. Pt ambulates with decrease cadence when walking outside as compared to level ground.  Pt response to tx for medical necessity: Pt benefits from balance re training to increase safety with functional mobility. Pt benefits from gait training to increase independence with SPC ambulation and safety around her apartment complex.       PT Long Term Goals - 03/29/15 1252    PT LONG TERM GOAL #1   Title Pt will improve BERG balance score to > 50/56 in order to safely ambulate household distances without use of an AD.   Baseline 43/56   Time 4   Period Weeks   Status Not Met   PT LONG TERM GOAL #2   Title Pt will improve LEFS to > 65/80 in order to report the ability to perform household chores without increased fatigue.   Baseline 48/80 on 12/29   Time 4   Period Weeks   Status Not Met  PT LONG TERM GOAL #3   Title Pt will be independent with HEP in order to increase L hip flexion strength by 1/2 MMT grade in order to ambulate safely.   Baseline 3/5    Time 4   Period Weeks   Status On-going   PT LONG TERM GOAL #4   Title Pt will consistently ambulate community distances with increased cadence and no LOB noted with least restrictive AD.   Time 4   Period Weeks   Status On-going   PT LONG TERM GOAL #5   Title Pt will perform a TUG in < 14 seconds without her SPC to ensure safety at home with functional mobility.   Time 4   Period Weeks   Status New               Plan - 04/12/15 1006    Clinical Impression Statement Pt remained at an O2 Sat of 94% and above throughout treatment session. Pt  was able to navigate an obstacle course with SPC and while carrying a bag with 5# in it. Pt demonstrates decreased cadence with SPC and decreased step length. Pt requires minimal verbal cuing when walking over unlevel terrain to maintain foot clearance with SPC. Pt continues to progress towards goals.    Pt will benefit from skilled therapeutic intervention in order to improve on the following deficits Abnormal gait;Decreased activity tolerance;Decreased balance;Decreased mobility;Decreased strength;Postural dysfunction;Improper body mechanics;Decreased endurance;Difficulty walking;Decreased range of motion   Rehab Potential Fair   Clinical Impairments Affecting Rehab Potential COPD and other co-morbidities   PT Frequency 2x / week   PT Duration 4 weeks   PT Treatment/Interventions ADLs/Self Care Home Management;Cryotherapy;Moist Heat;Balance training;Therapeutic exercise;Therapeutic activities;Manual techniques;Functional mobility training;Stair training;Gait training;Neuromuscular re-education;Patient/family education   PT Next Visit Plan functional activities with SPC/Single leg balance/functional and home mobility   PT Home Exercise Plan continue current plan   Recommended Other Services discussed Lung works again.   Consulted and Agree with Plan of Care Patient        Problem List Patient Active Problem List   Diagnosis Date Noted  . Encounter for preventive health examination 03/14/2015  . Breast cancer screening 03/14/2015  . Need for hepatitis C screening test 03/14/2015  . COPD with hypoxia ([place]) 02/16/2015  . COPD exacerbation ([place]) 02/03/2015  . Vitamin D deficiency 01/11/2015  . S/P hysterectomy with oophorectomy 09/22/2013  . Pulmonary hypertension ([place]) 07/14/2013  . Edema 01/12/2013  . Hypothyroidism 05/04/2012  . Severe mitral regurgitation by prior echocardiogram 01/11/2012  . Obesity (BMI 30-39.9) 08/04/2011  . Depression   . Hypertension   . Congenital absence of  one kidney   . History of sciatica     [name], SPT 04/12/2015, 10:09 AM  [place] [facility] [address] [place], [place], [address] Phone: [phone]   Fax:  [phone]  Name: Kinslee Dalpe MRN: [id number] Date of Birth: 01/16/1956

## 2015-04-15 ENCOUNTER — Ambulatory Visit
Admission: EM | Admit: 2015-04-15 | Discharge: 2015-04-15 | Disposition: A | Discharge status: Home / Self Care | Payer: Medicare Other | Attending: Family Medicine | Admitting: Family Medicine

## 2015-04-15 ENCOUNTER — Encounter: Payer: Self-pay | Admitting: Emergency Medicine

## 2015-04-15 DIAGNOSIS — J441 Chronic obstructive pulmonary disease with (acute) exacerbation: Secondary | ICD-10-CM | POA: Diagnosis not present

## 2015-04-15 MED ORDER — DOXYCYCLINE HYCLATE 100 MG PO TABS: 100.0000 mg | ORAL_TABLET | Freq: Two times a day (BID) | ORAL | Status: DC

## 2015-04-15 MED ORDER — IPRATROPIUM-ALBUTEROL 0.5-2.5 (3) MG/3ML IN SOLN
3.0000 mL | Freq: Once | RESPIRATORY_TRACT | Status: AC
  Administered 2015-04-15: 3 mL via RESPIRATORY_TRACT

## 2015-04-15 MED ORDER — BECLOMETHASONE DIPROPIONATE 40 MCG/ACT IN AERS
2.0000 | INHALATION_SPRAY | Freq: Two times a day (BID) | RESPIRATORY_TRACT | Status: DC
Start: 2015-04-15 — End: 2015-09-04

## 2015-04-15 NOTE — ED Notes (Signed)
[facility] called and stated that Insurance will not cover Qvar but will cover Flovent. Per Dr, Zenda Alpers ok to give a verbal for Flovent  26mcg

## 2015-04-15 NOTE — ED Provider Notes (Signed)
CSN: [id number]     Arrival date & time 04/15/15  1110 History   First MD Initiated Contact with Patient 04/15/15 1233     Chief Complaint  Patient presents with  . URI   (Consider location/radiation/quality/duration/timing/severity/associated sxs/prior Treatment) Patient is a 60 y.o. female presenting with URI. The history is provided by the patient.  URI Presenting symptoms: congestion, cough, fatigue and fever   Severity:  Moderate Onset quality:  Sudden Duration:  1 week Timing:  Constant Progression:  Worsening Chronicity:  Chronic (intermittent) Ineffective treatments:  Prescription medications and OTC medications (seen at ED and given zithromax about 10 days ago; states symptoms improved but didn't resolve completely) Risk factors: chronic respiratory disease (copd)     Past Medical History  Diagnosis Date  . Congenital absence of one kidney   . History of sciatica   . Depression     treated at   . Hypertension   . hypothyrodism   . COPD (chronic obstructive pulmonary disease) ([place])   . Anxiety    Past Surgical History  Procedure Laterality Date  . Combined hysterectomy abdominal w/ a&p repair / oophorectomy  1996    benign tumor  . Inner ear surgery      bilateral  . Abdominal hysterectomy    . Breast biopsy Right 2011    [facility]< benign   Family History  Problem Relation Age of Onset  . Multiple sclerosis Mother   . Coronary artery disease Father   . Heart disease Brother   . Breast cancer Paternal Aunt    Social History  Substance Use Topics  . Smoking status: Never Smoker   . Smokeless tobacco: Never Used  . Alcohol Use: No   OB History    No data available     Review of Systems  Constitutional: Positive for fever and fatigue.  HENT: Positive for congestion.   Respiratory: Positive for cough.     Allergies  Codeine; Penicillin g; and Prednisone  Home Medications   Prior to Admission medications   Medication Sig Start Date End  Date Taking? Authorizing Provider  albuterol (PROVENTIL HFA;VENTOLIN HFA) 108 (90 BASE) MCG/ACT inhaler Inhale 2 puffs into the lungs every 6 (six) hours as needed for wheezing or shortness of breath. WITH A SPACER 03/09/13   [name], MD  beclomethasone (QVAR) 40 MCG/ACT inhaler Inhale 2 puffs into the lungs 2 (two) times daily. 04/15/15   Norval Gable, MD  calcium-vitamin D (OSCAL WITH D) 500-200 MG-UNIT per tablet Take 1 tablet by mouth daily with breakfast. 07/14/13   [name], MD  doxycycline (VIBRA-TABS) 100 MG tablet Take 1 tablet (100 mg total) by mouth 2 (two) times daily. 04/15/15   Norval Gable, MD  ergocalciferol (DRISDOL) 50000 UNITS capsule Take 1 capsule (50,000 Units total) by mouth once a week. 03/18/15   [name], MD  hydrochlorothiazide (HYDRODIURIL) 25 MG tablet Take 1 tablet (25 mg total) by mouth daily. 09/28/14   [name], MD  levocetirizine (XYZAL) 5 MG tablet Take 1 tablet (5 mg total) by mouth every evening. 07/07/14   [name], MD  levothyroxine (SYNTHROID, LEVOTHROID) 112 MCG tablet TAKE 1 TABLET BY MOUTH EVERY DAY 09/26/14   [name], MD  loratadine-pseudoephedrine (CLARITIN-D 24-HOUR) 10-240 MG 24 hr tablet Take 1 tablet by mouth daily.    Historical Provider, MD  losartan (COZAAR) 100 MG tablet TAKE 1 TABLET BY MOUTH DAILY 04/03/15   [name], MD  Umeclidinium-Vilanterol (ANORO ELLIPTA) 62.5-25 MCG/INH AEPB Inhale 1 puff into the lungs daily.    Historical Provider, MD   Meds Ordered and Administered this Visit   Medications  ipratropium-albuterol (DUONEB) 0.5-2.5 (3) MG/3ML nebulizer solution 3 mL (3 mLs Nebulization Given 04/15/15 1241)    BP 129/62 mmHg  Pulse 82  Temp(Src) 97.2 F (36.2 C) (Tympanic)  Resp 16  Ht 5\' 1"  (1.549 m)  Wt 200 lb (90.719 kg)  BMI 37.81 kg/m2  SpO2 97% No data found.   Physical Exam  Constitutional: She appears well-developed and well-nourished. No distress.  HENT:  Head: Normocephalic and  atraumatic.  Right Ear: Tympanic membrane, external ear and ear canal normal.  Left Ear: Tympanic membrane, external ear and ear canal normal.  Nose: Mucosal edema and rhinorrhea present. No nose lacerations, sinus tenderness, nasal deformity, septal deviation or nasal septal hematoma. No epistaxis.  No foreign bodies. Right sinus exhibits maxillary sinus tenderness and frontal sinus tenderness. Left sinus exhibits maxillary sinus tenderness and frontal sinus tenderness.  Mouth/Throat: Uvula is midline, oropharynx is clear and moist and mucous membranes are normal. No oropharyngeal exudate.  Eyes: Conjunctivae and EOM are normal. Pupils are equal, round, and reactive to light. Right eye exhibits no discharge. Left eye exhibits no discharge. No scleral icterus.  Neck: Normal range of motion. Neck supple. No thyromegaly present.  Cardiovascular: Normal rate, regular rhythm and normal heart sounds.   Pulmonary/Chest: Effort normal. No respiratory distress. She has wheezes. She has no rales.  Diffuse expiratory wheezes  Lymphadenopathy:    She has no cervical adenopathy.  Skin: She is not diaphoretic.  Nursing note and vitals reviewed.   ED Course  Procedures (including critical care time)  Labs Review Labs Reviewed - No data to display  Imaging Review No results found.   Visual Acuity Review  Right Eye Distance:   Left Eye Distance:   Bilateral Distance:    Right Eye Near:   Left Eye Near:    Bilateral Near:         MDM   1. COPD with acute exacerbation [facility])    Discharge Medication List as of 04/15/2015  1:03 PM    START taking these medications   Details  beclomethasone (QVAR) 40 MCG/ACT inhaler Inhale 2 puffs into the lungs 2 (two) times daily., Starting 04/15/2015, Until Discontinued, Normal    doxycycline (VIBRA-TABS) 100 MG tablet Take 1 tablet (100 mg total) by mouth 2 (two) times daily., Starting 04/15/2015, Until Discontinued, Normal       1. diagnosis  reviewed with patient 2. rx as per orders above; reviewed possible side effects, interactions, risks and benefits  3. Recommend continue current home inhalers 4. Follow-up prn if symptoms worsen or don't improve    Norval Gable, MD 04/15/15 1536

## 2015-04-15 NOTE — ED Notes (Signed)
Cough, congested, SOB since this morning

## 2015-04-17 ENCOUNTER — Ambulatory Visit: Payer: Medicare Other | Admitting: Physical Therapy

## 2015-04-17 DIAGNOSIS — R5381 Other malaise: Secondary | ICD-10-CM

## 2015-04-17 DIAGNOSIS — R262 Difficulty in walking, not elsewhere classified: Secondary | ICD-10-CM | POA: Diagnosis not present

## 2015-04-17 DIAGNOSIS — R531 Weakness: Secondary | ICD-10-CM

## 2015-04-18 NOTE — Therapy (Signed)
[facility] [address] [place], [place], [address] Phone: [phone]   Fax:  [phone]  Physical Therapy Treatment  Patient Details  Name: [place] MRN: [id number] Date of Birth: 10/11/1955 Referring Provider: Dr. Volanda Napoleon  Encounter Date: 04/17/2015      PT End of Session - 04/18/15 1508    Visit Number 12   Number of Visits 16   Date for PT Re-Evaluation 04/26/15   Authorization - Visit Number 12   Authorization - Number of Visits 19   PT Start Time 4315   PT Stop Time 0947   PT Time Calculation (min) 49 min   Equipment Utilized During Treatment Gait belt   Activity Tolerance Patient tolerated treatment well;No increased pain   Behavior During Therapy [facility] for tasks assessed/performed      Past Medical History  Diagnosis Date  . Congenital absence of one kidney   . History of sciatica   . Depression     treated at [place]  . Hypertension   . hypothyrodism   . COPD (chronic obstructive pulmonary disease) ([place])   . Anxiety     Past Surgical History  Procedure Laterality Date  . Combined hysterectomy abdominal w/ a&p repair / oophorectomy  1996    benign tumor  . Inner ear surgery      bilateral  . Abdominal hysterectomy    . Breast biopsy Right 2011    [facility]< benign    There were no vitals filed for this visit.  Visit Diagnosis:  Difficulty walking  Generalized weakness  Physical deconditioning      Subjective Assessment - 04/18/15 1507    Subjective Pt. arrived to PT very SOB and tired.  Pt. states she is having continued difficulty "catching breath".  Pt. used inhaler this morning.     Limitations Walking;House hold activities   Patient Stated Goals get back to PLOF/back to do like she used to with house work without being tired/walking program/walk without AD   Currently in Pain? No/denies      OBJECTIVE: Neuro re-ed: Obstacle course with step ups, balance on airex foam pads, step overs  with emphasis on R foot clearance x 6 laps with SPC.  CGA for pt. Safety with mod. A for verbal cuing t/o tx. Session to increase R foot placement/ upright posture.  Ther ex: Seated marches with 3# weight x 20 (cuing to prevent hip ER). Standing marches with 3# weight x 15.  Focused on upright posture t/o tx. With cuing to correct head position/ use of SPC.  Cool down: Nustep L7 10 mins (no charge)- B UE/LE with no rest breaks.  Pt response to tx for medical necessity: Pt benefits from balance re training to increase safety. Pt benefits from strength training to increase functional mobility and more easily complete ADLs. Pt benefits from gait training to increase independence with SPC ambulation and safely navigate around her apartment complex.          PT Long Term Goals - 03/29/15 1252    PT LONG TERM GOAL #1   Title Pt will improve BERG balance score to > 50/56 in order to safely ambulate household distances without use of an AD.   Baseline 43/56   Time 4   Period Weeks   Status Not Met   PT LONG TERM GOAL #2   Title Pt will improve LEFS to > 65/80 in order to report the ability to perform household chores without increased  fatigue.   Baseline 48/80 on 12/29   Time 4   Period Weeks   Status Not Met   PT LONG TERM GOAL #3   Title Pt will be independent with HEP in order to increase L hip flexion strength by 1/2 MMT grade in order to ambulate safely.   Baseline 3/5    Time 4   Period Weeks   Status On-going   PT LONG TERM GOAL #4   Title Pt will consistently ambulate community distances with increased cadence and no LOB noted with least restrictive AD.   Time 4   Period Weeks   Status On-going   PT LONG TERM GOAL #5   Title Pt will perform a TUG in < 14 seconds without her SPC to ensure safety at home with functional mobility.   Time 4   Period Weeks   Status New               Plan - 04/18/15 1508    Clinical Impression Statement Pt's O2 stats remained at  94%-97% throughtout therapy session. Patient needed many rest breaks during obstacle course due to tightness in chest and difficulty breathing. Pt needed moderate cueing to pick up R foot when steeping over obejects on obstacle course. Pt demonstrated good posture with marching with 5# weight in both seated and standing.    Pt will benefit from skilled therapeutic intervention in order to improve on the following deficits Abnormal gait;Decreased activity tolerance;Decreased balance;Decreased mobility;Decreased endurance;Improper body mechanics;Difficulty walking;Decreased range of motion;Postural dysfunction;Decreased strength   Rehab Potential Fair   PT Frequency 2x / week   PT Duration 4 weeks   PT Treatment/Interventions ADLs/Self Care Home Management;Cryotherapy;Moist Heat;Balance training;Therapeutic exercise;Therapeutic activities;Manual techniques;Functional mobility training;Gait training;Stair training;Neuromuscular re-education;Patient/family education   PT Next Visit Plan continue muscle strengthening/emphasize foot clearance with gait/decrease rest breaks and increase endurance   PT Home Exercise Plan continue current HEP   Consulted and Agree with Plan of Care Patient        Problem List Patient Active Problem List   Diagnosis Date Noted  . Encounter for preventive health examination 03/14/2015  . Breast cancer screening 03/14/2015  . Need for hepatitis C screening test 03/14/2015  . COPD with hypoxia ([place]) 02/16/2015  . COPD exacerbation ([place]) 02/03/2015  . Vitamin D deficiency 01/11/2015  . S/P hysterectomy with oophorectomy 09/22/2013  . Pulmonary hypertension ([place]) 07/14/2013  . Edema 01/12/2013  . Hypothyroidism 05/04/2012  . Severe mitral regurgitation by prior echocardiogram 01/11/2012  . Obesity (BMI 30-39.9) 08/04/2011  . Depression   . Hypertension   . Congenital absence of one kidney   . History of sciatica    Pura Spice, PT, DPT # [id number]   04/18/2015,  3:17 PM  [facility] [address] [place], [place], [address] Phone: [phone]   Fax:  [phone]  Name: Lauren [place] MRN: [id number] Date of Birth: 09/03/1955

## 2015-04-19 ENCOUNTER — Ambulatory Visit: Payer: Medicare Other | Admitting: Physical Therapy

## 2015-04-19 ENCOUNTER — Encounter: Payer: Self-pay | Admitting: Physical Therapy

## 2015-04-19 DIAGNOSIS — R262 Difficulty in walking, not elsewhere classified: Secondary | ICD-10-CM | POA: Diagnosis not present

## 2015-04-19 DIAGNOSIS — R5381 Other malaise: Secondary | ICD-10-CM

## 2015-04-19 DIAGNOSIS — R531 Weakness: Secondary | ICD-10-CM

## 2015-04-19 NOTE — Therapy (Signed)
[place] [facility] [address] [place], [place], [address] Phone: [phone]   Fax:  [phone]  Physical Therapy Treatment  Patient Details  Name: Lauren [place] MRN: [id number] Date of Birth: January 07, 1956 Referring Provider: Dr. Volanda Napoleon  Encounter Date: 04/19/2015      PT End of Session - 04/19/15 1037    Visit Number 13   Number of Visits 16   Date for PT Re-Evaluation 04/26/15   Authorization - Visit Number 13   Authorization - Number of Visits 19   PT Start Time 0901   PT Stop Time 0957   PT Time Calculation (min) 56 min   Equipment Utilized During Treatment Gait belt   Activity Tolerance Patient tolerated treatment well;No increased pain   Behavior During Therapy [facility] for tasks assessed/performed      Past Medical History  Diagnosis Date  . Congenital absence of one kidney   . History of sciatica   . Depression     treated at [place]  . Hypertension   . hypothyrodism   . COPD (chronic obstructive pulmonary disease) ([place])   . Anxiety     Past Surgical History  Procedure Laterality Date  . Combined hysterectomy abdominal w/ a&p repair / oophorectomy  1996    benign tumor  . Inner ear surgery      bilateral  . Abdominal hysterectomy    . Breast biopsy Right 2011    [facility]< benign    There were no vitals filed for this visit.  Visit Diagnosis:  Difficulty walking  Generalized weakness  Physical deconditioning      Subjective Assessment - 04/19/15 1035    Subjective Pt arrived to PT slightly SOB but states she is feeling better than last visit. Pt has no complaints of pain.   Limitations Walking;House hold activities   Patient Stated Goals get back to PLOF/back to do like she used to with house work without being tired/walking program/walk without AD   Currently in Pain? No/denies      Objective: Nustep L7 seat:10 10 mins (warm-up/no charge) Ther ex: Placing cones on high shelf with B UE x 5. Obstacle course  with airex foam pads, step overs, and cone taps x 2 with cane and x 4 without cane with moderate verbal cueing for R foot clearance and good posture. Yellow ball toss in //-bars x 20 with CGA. Standing marches with 4# weight x 20 in //-bars with verbal cueing to keep back straight and focus on flexing hip only without leaning back.  Pt response to treatment for medical necessity: Pt benefits from skilled PT to increase strength and improve R foot clearance. Pt will improve balance and stability in order to become more independent and be able to confidently ambulate with no AD.          PT Long Term Goals - 03/29/15 1252    PT LONG TERM GOAL #1   Title Pt will improve BERG balance score to > 50/56 in order to safely ambulate household distances without use of an AD.   Baseline 43/56   Time 4   Period Weeks   Status Not Met   PT LONG TERM GOAL #2   Title Pt will improve LEFS to > 65/80 in order to report the ability to perform household chores without increased fatigue.   Baseline 48/80 on 12/29   Time 4   Period Weeks   Status Not Met   PT LONG TERM GOAL #3  Title Pt will be independent with HEP in order to increase L hip flexion strength by 1/2 MMT grade in order to ambulate safely.   Baseline 3/5    Time 4   Period Weeks   Status On-going   PT LONG TERM GOAL #4   Title Pt will consistently ambulate community distances with increased cadence and no LOB noted with least restrictive AD.   Time 4   Period Weeks   Status On-going   PT LONG TERM GOAL #5   Title Pt will perform a TUG in < 14 seconds without her SPC to ensure safety at home with functional mobility.   Time 4   Period Weeks   Status New            Plan - 04/19/15 1038    Clinical Impression Statement Pt maintained O2 stats of 98% throughout treatment session. Pt demonstrated improved endurance with NUSTEP and obstacle with minimal rest breaks taken. Pt demonstrated good form and posture with placing cones on high  shelf. Pt needed moderate verbal cueing for R foot clearance during obstacle course and also reported fear of placing weight on R foot with L foot cone taps. Pt also needed verbal and tactile cueing to keep good posture and look forward during obstacle course. Pt also required verbal and tactile cueing to keep back straight during standing marches.   Pt will benefit from skilled therapeutic intervention in order to improve on the following deficits Abnormal gait;Decreased activity tolerance;Decreased balance;Decreased mobility;Decreased endurance;Improper body mechanics;Difficulty walking;Decreased range of motion;Postural dysfunction;Decreased strength   Rehab Potential Fair   PT Frequency 2x / week   PT Duration 4 weeks   PT Treatment/Interventions ADLs/Self Care Home Management;Cryotherapy;Moist Heat;Balance training;Therapeutic exercise;Therapeutic activities;Manual techniques;Functional mobility training;Gait training;Stair training;Neuromuscular re-education;Patient/family education   PT Next Visit Plan continue muscle strengthening/emphasize foot clearance with gait/decrease rest breaks and increase endurance   PT Home Exercise Plan continue current HEP   Consulted and Agree with Plan of Care Patient        Problem List Patient Active Problem List   Diagnosis Date Noted  . Encounter for preventive health examination 03/14/2015  . Breast cancer screening 03/14/2015  . Need for hepatitis C screening test 03/14/2015  . COPD with hypoxia ([place]) 02/16/2015  . COPD exacerbation ([place]) 02/03/2015  . Vitamin D deficiency 01/11/2015  . S/P hysterectomy with oophorectomy 09/22/2013  . Pulmonary hypertension ([place]) 07/14/2013  . Edema 01/12/2013  . Hypothyroidism 05/04/2012  . Severe mitral regurgitation by prior echocardiogram 01/11/2012  . Obesity (BMI 30-39.9) 08/04/2011  . Depression   . Hypertension   . Congenital absence of one kidney   . History of sciatica     [name],  SPT 04/19/2015, 10:51 AM   [facility] [address] [place], [place], [address] Phone: [phone]   Fax:  [phone]  Name: Lauren [place] MRN: [id number] Date of Birth: 07-02-1955

## 2015-04-23 ENCOUNTER — Ambulatory Visit: Payer: Medicare Other | Admitting: Physical Therapy

## 2015-04-23 ENCOUNTER — Encounter: Payer: Self-pay | Admitting: Physical Therapy

## 2015-04-23 DIAGNOSIS — R262 Difficulty in walking, not elsewhere classified: Secondary | ICD-10-CM

## 2015-04-23 DIAGNOSIS — R531 Weakness: Secondary | ICD-10-CM

## 2015-04-23 DIAGNOSIS — R5381 Other malaise: Secondary | ICD-10-CM

## 2015-04-23 NOTE — Therapy (Signed)
[facility] [address] [place], [place], [address] Phone: [phone]   Fax:  [phone]  Physical Therapy Treatment  Patient Details  Name: Lauren [place] MRN: [id number] Date of Birth: [birth year] Referring Provider: Dr. Volanda Napoleon  Encounter Date: 04/23/2015      PT End of Session - 04/23/15 1118    Visit Number 14   Number of Visits 16   Date for PT Re-Evaluation 04/26/15   Authorization - Visit Number 14   Authorization - Number of Visits 19   PT Start Time 1601   PT Stop Time 1031   PT Time Calculation (min) 53 min   Equipment Utilized During Treatment Gait belt   Activity Tolerance Patient tolerated treatment well;No increased pain;Patient limited by fatigue   Behavior During Therapy [facility] for tasks assessed/performed      Past Medical History  Diagnosis Date  . Congenital absence of one kidney   . History of sciatica   . Depression     treated at [place]  . Hypertension   . hypothyrodism   . COPD (chronic obstructive pulmonary disease) ([place])   . Anxiety     Past Surgical History  Procedure Laterality Date  . Combined hysterectomy abdominal w/ a&p repair / oophorectomy  1996    benign tumor  . Inner ear surgery      bilateral  . Abdominal hysterectomy    . Breast biopsy Right 2011    [facility]< benign    There were no vitals filed for this visit.  Visit Diagnosis:  Difficulty walking  Generalized weakness  Physical deconditioning      Subjective Assessment - 04/23/15 1116    Subjective Pt states her breathing has been much better this week. Pt reports this weekend she was able to change sheets on bed wih good stability/no LOB episodes and minimal fatigue.   Limitations Walking;House hold activities   Patient Stated Goals get back to PLOF/back to do like she used to with house work without being tired/walking program/walk without AD   Currently in Pain? No/denies      Objective: Nustep: L7 Seat: 8 for  10 mins (no charge/warm up). B UE/LE with no rest breaks.Ther ex: seated marches x 20 with verbal cueing to prevent R hip ER. Step overs with 3" steps in //-bars x 10 with B UE support with cueing for heel to toe strike. Step overs with 3" steps in //-bars x 10 with single UE support. Step ups with 6" step x 20 (10 with R LE, 10 with L LE). Neuro re-ed: Obstacle course with airex balance foam pads, step overs, and cone taps with bag that contained 5# weight in order to simulate pt carrying groceries (which is something she still has trouble with) x 2 with SPC and x 2 without SPC, with moderate support from SPT. Pt required rest break after first 2 laps. During last 2 laps pt was unable to confidently put weight through L LE with R foot cone taps. Pt reports "unable to lift R foot" as a result of both fatigue and fear of losing balance. Pt also required constant cueing to keep back straight and eyes forward during obstacle course.  Pt response to treatment for medical necessity: Pt benefits from skilled PT to increase strength and improve R foot clearance for more efficient and stable gait pattern. Pt will improve balance and stability in order to become more independent at home and be able to carry groceries with  more ease.         PT Long Term Goals - 03/29/15 1252    PT LONG TERM GOAL #1   Title Pt will improve BERG balance score to > 50/56 in order to safely ambulate household distances without use of an AD.   Baseline 43/56   Time 4   Period Weeks   Status Not Met   PT LONG TERM GOAL #2   Title Pt will improve LEFS to > 65/80 in order to report the ability to perform household chores without increased fatigue.   Baseline 48/80 on 12/29   Time 4   Period Weeks   Status Not Met   PT LONG TERM GOAL #3   Title Pt will be independent with HEP in order to increase L hip flexion strength by 1/2 MMT grade in order to ambulate safely.   Baseline 3/5    Time 4   Period Weeks   Status On-going    PT LONG TERM GOAL #4   Title Pt will consistently ambulate community distances with increased cadence and no LOB noted with least restrictive AD.   Time 4   Period Weeks   Status On-going   PT LONG TERM GOAL #5   Title Pt will perform a TUG in < 14 seconds without her SPC to ensure safety at home with functional mobility.   Time 4   Period Weeks   Status New               Plan - 04/23/15 1119    Clinical Impression Statement Pt maintained O2 sat of 98% throughout tx session. Pt demonstrated good posture, keeping shoulders back and eyes forward, with 3" step overs in //-bars. Pt required moderate verbal cueing for R foot clearance with 3" step overs and during obstacle course. Pt needed rest break after first 2 laps of obstacle course and started experiencing increased fatigue and decreased stability with last 2 laps. Pt was unable to confidently lift R foot with R cone taps due to both fatigue and fear of putting weight through L LE.   Pt will benefit from skilled therapeutic intervention in order to improve on the following deficits Abnormal gait;Decreased activity tolerance;Decreased balance;Decreased mobility;Decreased endurance;Improper body mechanics;Difficulty walking;Decreased range of motion;Postural dysfunction;Decreased strength   Rehab Potential Fair   PT Frequency 2x / week   PT Duration 4 weeks   PT Treatment/Interventions ADLs/Self Care Home Management;Cryotherapy;Moist Heat;Balance training;Therapeutic exercise;Therapeutic activities;Manual techniques;Functional mobility training;Gait training;Stair training;Neuromuscular re-education;Patient/family education   PT Next Visit Plan continue muscle strengthening/emphasize foot clearance with gait/decrease rest breaks and increase endurance   PT Home Exercise Plan continue current HEP   Consulted and Agree with Plan of Care Patient        Problem List Patient Active Problem List   Diagnosis Date Noted  . Encounter for  preventive health examination 03/14/2015  . Breast cancer screening 03/14/2015  . Need for hepatitis C screening test 03/14/2015  . COPD with hypoxia ([place]) 02/16/2015  . COPD exacerbation ([place]) 02/03/2015  . Vitamin D deficiency 01/11/2015  . S/P hysterectomy with oophorectomy 09/22/2013  . Pulmonary hypertension ([place]) 07/14/2013  . Edema 01/12/2013  . Hypothyroidism 05/04/2012  . Severe mitral regurgitation by prior echocardiogram 01/11/2012  . Obesity (BMI 30-39.9) 08/04/2011  . Depression   . Hypertension   . Congenital absence of one kidney   . History of sciatica     [name], SPT 04/23/2015, 11:47 AM  [facility] [address] [place], [place], [address] Phone: [phone]   Fax:  [phone]  Name: [place] MRN: [id number] Date of Birth: 11/20/1955

## 2015-04-24 ENCOUNTER — Ambulatory Visit (INDEPENDENT_AMBULATORY_CARE_PROVIDER_SITE_OTHER): Payer: Medicare Other

## 2015-04-24 ENCOUNTER — Ambulatory Visit
Admission: EM | Admit: 2015-04-24 | Discharge: 2015-04-24 | Disposition: A | Discharge status: Home / Self Care | Payer: Medicare Other | Attending: Family Medicine | Admitting: Family Medicine

## 2015-04-24 DIAGNOSIS — J441 Chronic obstructive pulmonary disease with (acute) exacerbation: Secondary | ICD-10-CM

## 2015-04-24 MED ORDER — IPRATROPIUM-ALBUTEROL 0.5-2.5 (3) MG/3ML IN SOLN
3.0000 mL | Freq: Once | RESPIRATORY_TRACT | Status: AC
  Administered 2015-04-24: 3 mL via RESPIRATORY_TRACT

## 2015-04-24 MED ORDER — SULFAMETHOXAZOLE-TRIMETHOPRIM 800-160 MG PO TABS: 1.0000 | ORAL_TABLET | Freq: Two times a day (BID) | ORAL | Status: DC

## 2015-04-24 NOTE — Discharge Instructions (Signed)

## 2015-04-24 NOTE — ED Notes (Signed)
Hx of COPD and has been SOB for the last 2 months. States last night "coughed a whole lot". States has been wheezing

## 2015-04-24 NOTE — ED Provider Notes (Signed)
CSN: [id number]     Arrival date & time 04/24/15  1105 History   First MD Initiated Contact with Patient 04/24/15 1229     Nurses notes were reviewed.  Chief Complaint  Patient presents with  . Shortness of Breath   (Consider location/radiation/quality/duration/timing/severity/associated sxs/prior Treatment) Patient is a 60 y.o. female presenting with shortness of breath. The history is provided by the patient and a friend. No language interpreter was used.  Shortness of Breath Severity:  Moderate Onset quality:  Sudden Duration:  3 days Timing:  Constant Progression:  Resolved Chronicity:  New Context: URI   Relieved by:  Nothing Ineffective treatments:  Inhaler Associated symptoms: cough and diaphoresis   Risk factors: prolonged immobilization   Risk factors: no tobacco use     Past Medical History  Diagnosis Date  . Congenital absence of one kidney   . History of sciatica   . Depression     treated at [facility]  . Hypertension   . hypothyrodism   . COPD (chronic obstructive pulmonary disease) ([place])   . Anxiety    Past Surgical History  Procedure Laterality Date  . Combined hysterectomy abdominal w/ a&p repair / oophorectomy  1996    benign tumor  . Inner ear surgery      bilateral  . Abdominal hysterectomy    . Breast biopsy Right 2011    [facility]< benign   Family History  Problem Relation Age of Onset  . Multiple sclerosis Mother   . Coronary artery disease Father   . Heart disease Brother   . Breast cancer Paternal Aunt    Social History  Substance Use Topics  . Smoking status: Never Smoker   . Smokeless tobacco: Never Used  . Alcohol Use: No   OB History    No data available     Review of Systems  Constitutional: Positive for diaphoresis.  Respiratory: Positive for cough and shortness of breath.   All other systems reviewed and are negative.   Allergies  Codeine; Penicillin g; and Prednisone  Home Medications   Prior to Admission  medications   Medication Sig Start Date End Date Taking? Authorizing Provider  albuterol (PROVENTIL HFA;VENTOLIN HFA) 108 (90 BASE) MCG/ACT inhaler Inhale 2 puffs into the lungs every 6 (six) hours as needed for wheezing or shortness of breath. WITH A SPACER 03/09/13  Yes [name], MD  beclomethasone (QVAR) 40 MCG/ACT inhaler Inhale 2 puffs into the lungs 2 (two) times daily. 04/15/15  Yes Norval Gable, MD  calcium-vitamin D (OSCAL WITH D) 500-200 MG-UNIT per tablet Take 1 tablet by mouth daily with breakfast. 07/14/13  Yes [name], MD  doxycycline (VIBRA-TABS) 100 MG tablet Take 1 tablet (100 mg total) by mouth 2 (two) times daily. 04/15/15  Yes Norval Gable, MD  ergocalciferol (DRISDOL) 50000 UNITS capsule Take 1 capsule (50,000 Units total) by mouth once a week. 03/18/15  Yes [name], MD  hydrochlorothiazide (HYDRODIURIL) 25 MG tablet Take 1 tablet (25 mg total) by mouth daily. 09/28/14  Yes [name], MD  levothyroxine (SYNTHROID, LEVOTHROID) 112 MCG tablet TAKE 1 TABLET BY MOUTH EVERY DAY 09/26/14  Yes [name], MD  loratadine-pseudoephedrine (CLARITIN-D 24-HOUR) 10-240 MG 24 hr tablet Take 1 tablet by mouth daily.   Yes Historical Provider, MD  losartan (COZAAR) 100 MG tablet TAKE 1 TABLET BY MOUTH DAILY 04/03/15  Yes [name], MD  Umeclidinium-Vilanterol (ANORO ELLIPTA) 62.5-25 MCG/INH AEPB Inhale 1 puff into the  lungs daily.   Yes Historical Provider, MD  levocetirizine (XYZAL) 5 MG tablet Take 1 tablet (5 mg total) by mouth every evening. 07/07/14   [name], MD  sulfamethoxazole-trimethoprim (BACTRIM DS,SEPTRA DS) 800-160 MG tablet Take 1 tablet by mouth 2 (two) times daily. 04/24/15   Frederich Cha, MD   Meds Ordered and Administered this Visit   Medications  ipratropium-albuterol (DUONEB) 0.5-2.5 (3) MG/3ML nebulizer solution 3 mL (3 mLs Nebulization Given 04/24/15 1255)    BP 123/65 mmHg  Pulse 76  Temp(Src) 96.9 F (36.1 C) (Tympanic)  Resp 18   Ht 5\' 2"  (1.575 m)  Wt 199 lb (90.266 kg)  BMI 36.39 kg/m2  SpO2 95% No data found.   Physical Exam  Constitutional: She is oriented to person, place, and time. She appears well-developed and well-nourished. She is cooperative. She has a sickly appearance.  Patient is wheelchair bound with the appearance of chronic illness.  HENT:  Head: Normocephalic and atraumatic.  Eyes: Conjunctivae are normal. Pupils are equal, round, and reactive to light.  Neck: Normal range of motion. Neck supple.  Cardiovascular: Normal rate and regular rhythm.   Murmur heard.  Systolic murmur is present with a grade of 5/6  Pulmonary/Chest: She has no decreased breath sounds. She has wheezes.  Musculoskeletal: Normal range of motion.  Neurological: She is alert and oriented to person, place, and time.  Skin: Skin is warm and dry. No erythema.  Psychiatric: She has a normal mood and affect. Her behavior is normal.  Vitals reviewed.   ED Course  Procedures (including critical care time)  Labs Review Labs Reviewed - No data to display  Imaging Review Dg Chest 2 View  04/24/2015  CLINICAL DATA:  Cough and shortness of breath for 2 days. EXAM: CHEST  2 VIEW COMPARISON:  04/06/2015 FINDINGS: The cardiac silhouette, mediastinal and hilar contours are within normal limits and stable. There is mild tortuosity of the thoracic aorta. The lungs are clear of acute process. Minimal basilar scarring. No pleural effusion. The bony thorax is intact. IMPRESSION: No acute cardiopulmonary findings. Electronically Signed   By: Marijo Sanes M.D.   On: 04/24/2015 12:21     Visual Acuity Review  Right Eye Distance:   Left Eye Distance:   Bilateral Distance:    Right Eye Near:   Left Eye Near:    Bilateral Near:         MDM   1. COPD with acute exacerbation ([place])      Patient is allergic to prednisone and told with IV prednisone.. Chest x-ray was negative. This is the second visit for exacerbation COPD in  January  one was in the ED. She saw Dr. Cornelius Moras here in November.  Patient was given DuoNeb she states really helped her but interesting her pulse ox went from 98% to 95%. Strongly suggest she makes appointment with Dr. Raul Del her private pulmonologist to be seen and reevaluated before May. We'll place on Ceftin 500 mg and she was placed on Zithromax in the ED about 2 weeks ago. I recommend she continue the Flovent that Dr. Cornelius Moras put her on since she's been able to tolerate that and the other inhalers to Dr. Raul Del has her on.   Because patient has an allergy to penicillin and because of her history of severe allergies to multiple medications we'll place on Septra DS 1 tablet twice a day instead of the [place], MD 04/24/15 1333

## 2015-04-26 ENCOUNTER — Encounter: Payer: Self-pay | Admitting: Physical Therapy

## 2015-04-26 ENCOUNTER — Ambulatory Visit: Payer: Medicare Other | Admitting: Physical Therapy

## 2015-04-26 DIAGNOSIS — R262 Difficulty in walking, not elsewhere classified: Secondary | ICD-10-CM

## 2015-04-26 DIAGNOSIS — R5381 Other malaise: Secondary | ICD-10-CM

## 2015-04-26 DIAGNOSIS — R531 Weakness: Secondary | ICD-10-CM

## 2015-04-26 NOTE — Therapy (Signed)
[facility] [address] [place], [place], [address] Phone: [phone]   Fax:  [phone]  Physical Therapy Treatment  Patient Details  Name: Lauren Bartlett MRN: [id number] Date of Birth: [birth year] Referring Provider: Dr. Volanda Napoleon  Encounter Date: 04/26/2015      PT End of Session - 04/26/15 1752    Visit Number 15   Number of Visits 24   Date for PT Re-Evaluation 05/24/15   Authorization - Visit Number 15   Authorization - Number of Visits 24   PT Start Time [phone]   PT Stop Time 7048   PT Time Calculation (min) 50 min   Equipment Utilized During Treatment Gait belt   Activity Tolerance Patient tolerated treatment well;No increased pain;Patient limited by fatigue   Behavior During Therapy Flat affect      Past Medical History  Diagnosis Date  . Congenital absence of one kidney   . History of sciatica   . Depression     treated at [facility]  . Hypertension   . hypothyrodism   . COPD (chronic obstructive pulmonary disease) ([place])   . Anxiety     Past Surgical History  Procedure Laterality Date  . Combined hysterectomy abdominal w/ a&p repair / oophorectomy  1996    benign tumor  . Inner ear surgery      bilateral  . Abdominal hysterectomy    . Breast biopsy Right 2011    [facility]< benign    There were no vitals filed for this visit.  Visit Diagnosis:  Difficulty walking  Generalized weakness  Physical deconditioning      Subjective Assessment - 04/26/15 1750    Subjective Pt arrived to PT session feeling out of breath and needing a rest break before session began. Pt reports no pain today but has been feeling very congested lately. Pt states she went to ER on tues, 1/24, due to having trouble breathing and plans to schedule appt with MD to discuss her COPD.    Limitations Walking;House hold activities   Patient Stated Goals get back to PLOF/back to do like she used to with house work without being tired/walking  program/walk without AD   Currently in Pain? No/denies     Objective: Pt began treatment with an O2 sat of 94%. Cone reaches to higher shelf x1 in standing on compliant surface and 2x on airex foam pad with pt feeling fatigued and end of second set. Step ups on a 6' step x 30 with multiple rest breaks in between. Lateral step ups on 6" step x 20 with multiple rest breaks. Nustep: 10 mins (no charge/cool down)  Pt response to treatment for medical necessity: Pt was limited by fatigue and was short of breath throughout session. Pt benefits from skilled PT to improve balance and endurance in order to become more independent at home and in community. Pt will also increase strength and foot clearance in order to ascend/descend stairs at church.  BERG: 50/56 LEFS: 45/80        PT Long Term Goals - 04/26/15 1801    PT LONG TERM GOAL #1   Title Pt will improve BERG balance score to > 50/56 in order to safely ambulate household distances without use of an AD.   Baseline 50/56 on 1/26   Time 4   Period Weeks   Status Achieved   PT LONG TERM GOAL #2   Title Pt will improve LEFS to > 65/80 in order to report  the ability to perform household chores without increased fatigue.   Baseline 48/80 on 12/29 45/80 on May 18, 2022   Time 4   Period Weeks   Status Not Met   PT LONG TERM GOAL #3   Title Pt will be independent with HEP in order to increase L hip flexion strength by 1/2 MMT grade in order to ambulate safely.   Baseline 4-/5 on 05/18/22   Time 4   Period Weeks   Status Achieved   PT LONG TERM GOAL #4   Title Pt will consistently ambulate community distances with increased cadence and no LOB noted with least restrictive AD.   Time 4   Period Weeks   Status On-going   PT LONG TERM GOAL #5   Title Pt will perform a TUG in < 14 seconds without her SPC to ensure safety at home with functional mobility.   Time 4   Period Weeks   Status New               Plan - May 19, 2015 1753    Clinical  Impression Statement Pt maintain O2 sats of 94-98% throughtout treatment session, needing multiple rest breaks with exercises due to fatigue. Pt reports she was able to walk outside at slow pace with walker for one hour with no complaints but was having trouble with catching breath today. Pt demonstrated good form and foot clearance with forward step ups and lateral step ups on 6" step needing minimal cueing to keep back straight and eyes forward. Pt also demonstrated good form with eccentric control with 3" step up. Pt required cueing to keep eyes up with overhead reaching but demonstrated good balance and posture on Airex foam pad. BERG: 50/56 LEFS: 45/80    Pt will benefit from skilled therapeutic intervention in order to improve on the following deficits Abnormal gait;Decreased activity tolerance;Decreased balance;Decreased mobility;Decreased endurance;Improper body mechanics;Difficulty walking;Decreased range of motion;Postural dysfunction;Decreased strength   Rehab Potential Fair   PT Frequency 2x / week   PT Duration 4 weeks   PT Treatment/Interventions ADLs/Self Care Home Management;Cryotherapy;Moist Heat;Balance training;Therapeutic exercise;Therapeutic activities;Manual techniques;Functional mobility training;Gait training;Stair training;Neuromuscular re-education;Patient/family education   PT Next Visit Plan continue muscle strengthening/emphasize foot clearance with gait/decrease rest breaks and increase endurance   PT Home Exercise Plan continue current HEP   Consulted and Agree with Plan of Care Patient          G-Codes - 19-May-2015 1141    Functional Assessment Tool Used Clinical judgement/ gait/ Berg/ LEFS   Mobility: Walking and Moving Around Current Status (R6789) At least 20 percent but less than 40 percent impaired, limited or restricted   Mobility: Walking and Moving Around Goal Status (F8101) At least 1 percent but less than 20 percent impaired, limited or restricted       Problem List Patient Active Problem List   Diagnosis Date Noted  . Encounter for preventive health examination 03/14/2015  . Breast cancer screening 03/14/2015  . Need for hepatitis C screening test 03/14/2015  . COPD with hypoxia ([place]) 02/16/2015  . COPD exacerbation ([place]) 02/03/2015  . Vitamin D deficiency 01/11/2015  . S/P hysterectomy with oophorectomy 09/22/2013  . Pulmonary hypertension ([place]) 07/14/2013  . Edema 01/12/2013  . Hypothyroidism 05/04/2012  . Severe mitral regurgitation by prior echocardiogram 01/11/2012  . Obesity (BMI 30-39.9) 08/04/2011  . Depression   . Hypertension   . Congenital absence of one kidney   . History of sciatica    Pura Spice, PT, DPT #  [id number]   04/27/2015, 11:43 AM  [facility] [address] [place], [place], [address] Phone: [phone]   Fax:  [phone]  Name: Lauren Bartlett MRN: [id number] Date of Birth: [birth year]

## 2015-04-30 ENCOUNTER — Other Ambulatory Visit: Payer: Self-pay | Admitting: Internal Medicine

## 2015-05-01 ENCOUNTER — Ambulatory Visit: Payer: Medicare Other | Admitting: Physical Therapy

## 2015-05-01 ENCOUNTER — Encounter: Payer: Self-pay | Admitting: Physical Therapy

## 2015-05-01 DIAGNOSIS — R531 Weakness: Secondary | ICD-10-CM

## 2015-05-01 DIAGNOSIS — R262 Difficulty in walking, not elsewhere classified: Secondary | ICD-10-CM | POA: Diagnosis not present

## 2015-05-01 DIAGNOSIS — R5381 Other malaise: Secondary | ICD-10-CM

## 2015-05-01 NOTE — Therapy (Signed)
[facility] [address] [place], [place], [address] Phone: [phone]   Fax:  [phone]  Physical Therapy Treatment  Patient Details  Name: Lauren Bartlett MRN: [id number] Date of Birth: [birth year] Referring Provider: Dr. Volanda Napoleon  Encounter Date: 05/01/2015      PT End of Session - 05/01/15 1414    Visit Number 16   Number of Visits 24   Date for PT Re-Evaluation 05/24/15   Authorization - Visit Number 16   Authorization - Number of Visits 24   PT Start Time 1112   PT Stop Time 1202   PT Time Calculation (min) 50 min   Equipment Utilized During Treatment Gait belt   Activity Tolerance Patient tolerated treatment well;No increased pain;Patient limited by fatigue   Behavior During Therapy Flat affect      Past Medical History  Diagnosis Date  . Congenital absence of one kidney   . History of sciatica   . Depression     treated at [place]  . Hypertension   . hypothyrodism   . COPD (chronic obstructive pulmonary disease) ([place])   . Anxiety     Past Surgical History  Procedure Laterality Date  . Combined hysterectomy abdominal w/ a&p repair / oophorectomy  1996    benign tumor  . Inner ear surgery      bilateral  . Abdominal hysterectomy    . Breast biopsy Right 2011    [facility]< benign    There were no vitals filed for this visit.  Visit Diagnosis:  Generalized weakness  Difficulty walking  Physical deconditioning      Subjective Assessment - 05/01/15 1413    Subjective Pt reports feeling a cold today. Pt reports breathing feels better today as compared to last weekend. Pt attempted to walk outside but unable to due to shortness of breath.    Limitations Walking;House hold activities   Patient Stated Goals get back to PLOF/back to do like she used to with house work without being tired/walking program/walk without AD   Currently in Pain? No/denies       OBJECTIVE: Neuro re-ed: SLS on firm with one UE  support in the // bars x 30 seconds. SLS on foam with one UE support in the // bars. Obstacle course with weaving, stepping over and balance on Airex, cone taps in the hallway with SPC; one rest break needed. There ex: Standing marching with 5# ankle weights in // bars x 30. Nustep to cool down.   Pt response to tx for medical necessity: Pt benefits from strengthening to increase independence in ambulation with SPC. Pt benefits from gait training and balance training to increase safety with functional mobility.         PT Long Term Goals - 04/26/15 1801    PT LONG TERM GOAL #1   Title Pt will improve BERG balance score to > 50/56 in order to safely ambulate household distances without use of an AD.   Baseline 50/56 on 1/26   Time 4   Period Weeks   Status Achieved   PT LONG TERM GOAL #2   Title Pt will improve LEFS to > 65/80 in order to report the ability to perform household chores without increased fatigue.   Baseline 48/80 on 12/29 45/80 on 1/26   Time 4   Period Weeks   Status Not Met   PT LONG TERM GOAL #3   Title Pt will be independent with HEP in order to  increase L hip flexion strength by 1/2 MMT grade in order to ambulate safely.   Baseline 4-/5 on 1/26   Time 4   Period Weeks   Status Achieved   PT LONG TERM GOAL #4   Title Pt will consistently ambulate community distances with increased cadence and no LOB noted with least restrictive AD.   Time 4   Period Weeks   Status On-going   PT LONG TERM GOAL #5   Title Pt will perform a TUG in < 14 seconds without her SPC to ensure safety at home with functional mobility.   Time 4   Period Weeks   Status New            Plan - 05/01/15 1414    Clinical Impression Statement Pt demonstrates an improvement in walking with SPC. Pt O2 saturations at or above 95%. Pt was unable to maintain balance without use of UE support. Pt unable to perform obstacle course without use of SPC. Pt demonstrates an increase in activity  tolerance as noted by decreased rest breaks, 2.    Pt will benefit from skilled therapeutic intervention in order to improve on the following deficits Abnormal gait;Decreased activity tolerance;Decreased balance;Decreased mobility;Decreased endurance;Improper body mechanics;Difficulty walking;Decreased range of motion;Postural dysfunction;Decreased strength   Rehab Potential Fair   PT Frequency 2x / week   PT Duration 4 weeks   PT Treatment/Interventions ADLs/Self Care Home Management;Cryotherapy;Moist Heat;Balance training;Therapeutic exercise;Therapeutic activities;Manual techniques;Functional mobility training;Gait training;Stair training;Neuromuscular re-education;Patient/family education   PT Next Visit Plan continue muscle strengthening/emphasize foot clearance with gait/decrease rest breaks and increase endurance   PT Home Exercise Plan continue current HEP   Consulted and Agree with Plan of Care Patient        Problem List Patient Active Problem List   Diagnosis Date Noted  . Encounter for preventive health examination 03/14/2015  . Breast cancer screening 03/14/2015  . Need for hepatitis C screening test 03/14/2015  . COPD with hypoxia ([place]) 02/16/2015  . COPD exacerbation ([place]) 02/03/2015  . Vitamin D deficiency 01/11/2015  . S/P hysterectomy with oophorectomy 09/22/2013  . Pulmonary hypertension ([place]) 07/14/2013  . Edema 01/12/2013  . Hypothyroidism 05/04/2012  . Severe mitral regurgitation by prior echocardiogram 01/11/2012  . Obesity (BMI 30-39.9) 08/04/2011  . Depression   . Hypertension   . Congenital absence of one kidney   . History of sciatica     Nekeya Briski,SPT 05/01/2015, 2:18 PM  [facility] [address] [place], [place], [address] Phone: [phone]   Fax:  [phone]  Name: Lauren Bartlett MRN: [id number] Date of Birth: [birth year]

## 2015-05-02 ENCOUNTER — Other Ambulatory Visit: Payer: Self-pay | Admitting: Internal Medicine

## 2015-05-03 ENCOUNTER — Telehealth: Payer: Self-pay | Admitting: Internal Medicine

## 2015-05-03 ENCOUNTER — Encounter: Payer: Self-pay | Admitting: Physical Therapy

## 2015-05-03 ENCOUNTER — Ambulatory Visit: Payer: Medicare Other | Attending: Internal Medicine | Admitting: Physical Therapy

## 2015-05-03 ENCOUNTER — Other Ambulatory Visit: Payer: Self-pay

## 2015-05-03 DIAGNOSIS — R262 Difficulty in walking, not elsewhere classified: Secondary | ICD-10-CM | POA: Insufficient documentation

## 2015-05-03 DIAGNOSIS — R531 Weakness: Secondary | ICD-10-CM

## 2015-05-03 DIAGNOSIS — R5381 Other malaise: Secondary | ICD-10-CM | POA: Insufficient documentation

## 2015-05-03 NOTE — Telephone Encounter (Signed)
Notified pt that meds are ready for p/u

## 2015-05-03 NOTE — Therapy (Signed)
[facility] [address] [place], [place], [address] Phone: [phone]   Fax:  [phone]  Physical Therapy Treatment  Patient Details  Name: Lauren Bartlett MRN: [id number] Date of Birth: 1955/11/11 Referring Provider: Dr. Volanda Napoleon  Encounter Date: 05/03/2015      PT End of Session - 05/03/15 1232    Visit Number 17   Number of Visits 24   Date for PT Re-Evaluation 05/24/15   Authorization - Visit Number 17   Authorization - Number of Visits 24   PT Start Time 2979   PT Stop Time 1018   PT Time Calculation (min) 37 min   Equipment Utilized During Treatment Gait belt   Activity Tolerance No increased pain;Patient limited by fatigue   Behavior During Therapy Flat affect      Past Medical History  Diagnosis Date  . Congenital absence of one kidney   . History of sciatica   . Depression     treated at [place]  . Hypertension   . hypothyrodism   . COPD (chronic obstructive pulmonary disease) ([place])   . Anxiety     Past Surgical History  Procedure Laterality Date  . Combined hysterectomy abdominal w/ a&p repair / oophorectomy  1996    benign tumor  . Inner ear surgery      bilateral  . Abdominal hysterectomy    . Breast biopsy Right 2011    [facility]< benign    There were no vitals filed for this visit.  Visit Diagnosis:  Generalized weakness  Difficulty walking  Physical deconditioning      Subjective Assessment - 05/03/15 1231    Subjective Pt arrived to PT with congestion and SOB. Pt reports she "had a bad day" yesterday and was not able to go on her walk due to congestions and headache.    Limitations Walking;House hold activities   Patient Stated Goals get back to PLOF/back to do like she used to with house work without being tired/walking program/walk without AD   Currently in Pain? No/denies       Objectives:  Nu step: 10 mins (warm-up/no charge) Ther ex: Step ups/down x 20 in //-bars with single UE  support. Neuro re-ed: Cone reaching on stable surface x 2 in //bars with cueing to prevent leaning on //-bars. Cone reaching on airex foam pad x 4 in //-bars. Weight shifting to lateral aspect of foot B x 4 each.   Pt response to treatment for medical necessity: Pt reports headache at end of session. Treatment session was limited due to fatigue and headache. Pt will benefit from skilled PT to improve balance and increase endurance in order to safely ambulate with least restrictive assistive device. Pt will benefit from strengthening in order to improve functional mobility and return to prior level of function.        PT Long Term Goals - 04/26/15 1801    PT LONG TERM GOAL #1   Title Pt will improve BERG balance score to > 50/56 in order to safely ambulate household distances without use of an AD.   Baseline 50/56 on 1/26   Time 4   Period Weeks   Status Achieved   PT LONG TERM GOAL #2   Title Pt will improve LEFS to > 65/80 in order to report the ability to perform household chores without increased fatigue.   Baseline 48/80 on 12/29 45/80 on 1/26   Time 4   Period Weeks   Status Not  Met   PT LONG TERM GOAL #3   Title Pt will be independent with HEP in order to increase L hip flexion strength by 1/2 MMT grade in order to ambulate safely.   Baseline 4-/5 on 1/26   Time 4   Period Weeks   Status Achieved   PT LONG TERM GOAL #4   Title Pt will consistently ambulate community distances with increased cadence and no LOB noted with least restrictive AD.   Time 4   Period Weeks   Status On-going   PT LONG TERM GOAL #5   Title Pt will perform a TUG in < 14 seconds without her SPC to ensure safety at home with functional mobility.   Time 4   Period Weeks   Status New           Plan - 05/03/15 1234    Clinical Impression Statement Pt demonstrated decreased endurance today due to congestion and SOB with many rest breaks required throughout the session. Pt demonstrated good eccentric  control with step downs with help of single UE. Pt demonstrated pronation with cone reaching on airex foam pad and increase wear on medial side of footwear. Pt reports having the same shoes for a long time. SPT instructed pt on the importance of good footwear and support. Pt reports good form with weight shifting to lateral aspect of LE with slight ITband pain on L LE.    Pt will benefit from skilled therapeutic intervention in order to improve on the following deficits Abnormal gait;Decreased activity tolerance;Decreased balance;Decreased mobility;Decreased endurance;Improper body mechanics;Difficulty walking;Decreased range of motion;Postural dysfunction;Decreased strength   Rehab Potential Fair   PT Frequency 2x / week   PT Duration 4 weeks   PT Treatment/Interventions ADLs/Self Care Home Management;Cryotherapy;Moist Heat;Balance training;Therapeutic exercise;Therapeutic activities;Manual techniques;Functional mobility training;Gait training;Stair training;Neuromuscular re-education;Patient/family education   PT Next Visit Plan continue muscle strengthening/emphasize foot clearance with gait/decrease rest breaks and increase endurance   PT Home Exercise Plan continue current HEP   Consulted and Agree with Plan of Care Patient        Problem List Patient Active Problem List   Diagnosis Date Noted  . Encounter for preventive health examination 03/14/2015  . Breast cancer screening 03/14/2015  . Need for hepatitis C screening test 03/14/2015  . COPD with hypoxia ([place]) 02/16/2015  . COPD exacerbation ([place]) 02/03/2015  . Vitamin D deficiency 01/11/2015  . S/P hysterectomy with oophorectomy 09/22/2013  . Pulmonary hypertension ([place]) 07/14/2013  . Edema 01/12/2013  . Hypothyroidism 05/04/2012  . Severe mitral regurgitation by prior echocardiogram 01/11/2012  . Obesity (BMI 30-39.9) 08/04/2011  . Depression   . Hypertension   . Congenital absence of one kidney   . History of sciatica      Lauren Bartlett, SPT 05/03/2015, 12:50 PM  [facility] [address] [place], [place], [address] Phone: [phone]   Fax:  [phone]  Name: Sherah Lund MRN: [id number] Date of Birth: Nov 19, 1955

## 2015-05-03 NOTE — Telephone Encounter (Signed)
Pt called needing a refill on hydrochlorothiazide (HYDRODIURIL) 25 MG tablet, losartan (COZAAR) 100 MG tablet. Pharmacy is [id number] - [place], [place] [address] AT [place]. Call pt @ [phone]. Thank you!

## 2015-05-10 ENCOUNTER — Ambulatory Visit: Payer: Medicare Other | Admitting: Physical Therapy

## 2015-05-10 ENCOUNTER — Encounter: Payer: Self-pay | Admitting: Physical Therapy

## 2015-05-10 DIAGNOSIS — R531 Weakness: Secondary | ICD-10-CM | POA: Diagnosis not present

## 2015-05-10 DIAGNOSIS — R262 Difficulty in walking, not elsewhere classified: Secondary | ICD-10-CM

## 2015-05-10 DIAGNOSIS — R5381 Other malaise: Secondary | ICD-10-CM

## 2015-05-10 NOTE — Therapy (Signed)
[facility] [address] [place], [place], [address] Phone: [phone]   Fax:  [phone]  Physical Therapy Treatment  Patient Details  Name: Lauren Bartlett MRN: [id number] Date of Birth: 09-24-1955 Referring Provider: Dr. Volanda Napoleon  Encounter Date: 05/10/2015      PT End of Session - 05/10/15 1258    Visit Number 18   Number of Visits 24   Date for PT Re-Evaluation 05/24/15   Authorization - Visit Number 18   Authorization - Number of Visits 24   PT Start Time 0940   PT Stop Time 1031   PT Time Calculation (min) 51 min   Equipment Utilized During Treatment Gait belt   Activity Tolerance No increased pain;Patient limited by fatigue   Behavior During Therapy Flat affect      Past Medical History  Diagnosis Date  . Congenital absence of one kidney   . History of sciatica   . Depression     treated at [place]  . Hypertension   . hypothyrodism   . COPD (chronic obstructive pulmonary disease) ([place])   . Anxiety     Past Surgical History  Procedure Laterality Date  . Combined hysterectomy abdominal w/ a&p repair / oophorectomy  1996    benign tumor  . Inner ear surgery      bilateral  . Abdominal hysterectomy    . Breast biopsy Right 2011    [facility]< benign    There were no vitals filed for this visit.  Visit Diagnosis:  Physical deconditioning  Generalized weakness  Difficulty walking      Subjective Assessment - 05/10/15 1255    Subjective Pt arrived to therapy today reporting that she felt "very winded" and that it just wasn't a good day. Pt also stated that she was able to go to church Sunday and walked for an hr yesterday. Pt did not have any complaints of pain today.    Limitations Walking;House hold activities   Patient Stated Goals get back to PLOF/back to do like she used to with house work without being tired/walking program/walk without AD   Currently in Pain? No/denies      Objective: Nustep 12 min  (warm up: no charge). There ex: Standing marches BIL w/ 4# in //-bars10 x 3. Step ups on 6 in step 2 x 10 w/ verbal postural cueing. Neuro re-ed: Cone reaching 2 x 10 on compliant surface; 1 x 10 with feet together; 2 x 10 on Airex; 2 x 10 on Airex with feet together. Obstacle course consisting of airex foam pad, step-overs, and cone tapping x 6 laps w/ long rest breaks in between due to pt fatigue.  Pt response to treatment for medical necessity: Pt will benefit from PT to increase balance for household and community ambulation, strength for navigating curbs and stairs at church, and endurance exercises to improve her walking distance.          PT Long Term Goals - 04/26/15 1801    PT LONG TERM GOAL #1   Title Pt will improve BERG balance score to > 50/56 in order to safely ambulate household distances without use of an AD.   Baseline 50/56 on 1/26   Time 4   Period Weeks   Status Achieved   PT LONG TERM GOAL #2   Title Pt will improve LEFS to > 65/80 in order to report the ability to perform household chores without increased fatigue.   Baseline 48/80 on 12/29  45/80 on 1/26   Time 4   Period Weeks   Status Not Met   PT LONG TERM GOAL #3   Title Pt will be independent with HEP in order to increase L hip flexion strength by 1/2 MMT grade in order to ambulate safely.   Baseline 4-/5 on 1/26   Time 4   Period Weeks   Status Achieved   PT LONG TERM GOAL #4   Title Pt will consistently ambulate community distances with increased cadence and no LOB noted with least restrictive AD.   Time 4   Period Weeks   Status On-going   PT LONG TERM GOAL #5   Title Pt will perform a TUG in < 14 seconds without her SPC to ensure safety at home with functional mobility.   Time 4   Period Weeks   Status New            Plan - 05/10/15 1259    Clinical Impression Statement Pt was limited today primarily by fatigue and SOB. Pt required several rest breaks because she SOB and feeling dizzy. Pt  required verbal and tactile cues for proper posture and form for ther ex. After her warm-up on the bicycle, pt's SPO2 was 100. During the session, pt's BP was taken and read 130/63 and her SPO2 was 97.    Pt will benefit from skilled therapeutic intervention in order to improve on the following deficits Abnormal gait;Decreased activity tolerance;Decreased balance;Decreased mobility;Decreased endurance;Improper body mechanics;Difficulty walking;Decreased range of motion;Postural dysfunction;Decreased strength;Dizziness   Rehab Potential Fair   PT Frequency 2x / week   PT Duration 4 weeks   PT Treatment/Interventions ADLs/Self Care Home Management;Cryotherapy;Moist Heat;Balance training;Therapeutic exercise;Therapeutic activities;Manual techniques;Functional mobility training;Gait training;Stair training;Neuromuscular re-education;Patient/family education   PT Next Visit Plan continue muscle strengthening/emphasize foot clearance with gait/decrease rest breaks and increase endurance/continue working on balance to work on habituation to decrease dizziness while on compliant surfaces   PT Home Exercise Plan continue current HEP   Consulted and Agree with Plan of Care Patient        Problem List Patient Active Problem List   Diagnosis Date Noted  . Encounter for preventive health examination 03/14/2015  . Breast cancer screening 03/14/2015  . Need for hepatitis C screening test 03/14/2015  . COPD with hypoxia ([place]) 02/16/2015  . COPD exacerbation ([place]) 02/03/2015  . Vitamin D deficiency 01/11/2015  . S/P hysterectomy with oophorectomy 09/22/2013  . Pulmonary hypertension ([place]) 07/14/2013  . Edema 01/12/2013  . Hypothyroidism 05/04/2012  . Severe mitral regurgitation by prior echocardiogram 01/11/2012  . Obesity (BMI 30-39.9) 08/04/2011  . Depression   . Hypertension   . Congenital absence of one kidney   . History of sciatica     [name], SPT and Dorice Lamas, SPT 05/10/2015,  1:11 PM  [facility] [address] [place], [place], [address] Phone: [phone]   Fax:  [phone]  Name: Lauren Bartlett MRN: [id number] Date of Birth: 11-01-1955

## 2015-05-15 ENCOUNTER — Ambulatory Visit: Payer: Medicare Other | Admitting: Physical Therapy

## 2015-05-15 ENCOUNTER — Encounter: Payer: Self-pay | Admitting: Physical Therapy

## 2015-05-15 DIAGNOSIS — R531 Weakness: Secondary | ICD-10-CM | POA: Diagnosis not present

## 2015-05-15 DIAGNOSIS — R262 Difficulty in walking, not elsewhere classified: Secondary | ICD-10-CM

## 2015-05-15 DIAGNOSIS — R5381 Other malaise: Secondary | ICD-10-CM

## 2015-05-15 NOTE — Therapy (Signed)
[facility] [address] [place], [place], [address] Phone: [phone]   Fax:  [phone]  Physical Therapy Treatment  Patient Details  Name: Lauren Bartlett MRN: [id number] Date of Birth: 10/05/1955 Referring Provider: Dr. Volanda Napoleon  Encounter Date: 05/15/2015      PT End of Session - 05/15/15 0958    Visit Number 19   Number of Visits 24   Date for PT Re-Evaluation 05/24/15   Authorization - Visit Number 19   Authorization - Number of Visits 24   PT Start Time 0941   PT Stop Time 1040   PT Time Calculation (min) 59 min   Equipment Utilized During Treatment Gait belt   Activity Tolerance No increased pain;Patient limited by fatigue   Behavior During Therapy Flat affect      Past Medical History  Diagnosis Date  . Congenital absence of one kidney   . History of sciatica   . Depression     treated at [place]  . Hypertension   . hypothyrodism   . COPD (chronic obstructive pulmonary disease) ([place])   . Anxiety     Past Surgical History  Procedure Laterality Date  . Combined hysterectomy abdominal w/ a&p repair / oophorectomy  1996    benign tumor  . Inner ear surgery      bilateral  . Abdominal hysterectomy    . Breast biopsy Right 2011    [facility]< benign    There were no vitals filed for this visit.  Visit Diagnosis:  Generalized weakness  Physical deconditioning  Difficulty walking      Subjective Assessment - 05/15/15 0957    Subjective Pt reports no new complaints today. Pt reports having a hard time cleaning yesterday. Pt states she had to use walker when going to church this Sunday due to not feeling confident with SPC that day. Pt reports she feels like she is having a good day today.   Limitations Walking;House hold activities   Patient Stated Goals get back to PLOF/back to do like she used to with house work without being tired/walking program/walk without AD   Currently in Pain? No/denies      OBJECTIVE: warm up: Nustep L7 10 mins (no charge/warm-up). In the // bars one UE support 6" step downs x 20 on each leg. Lateral step ups on 6" steps x 20 with one UE support with verbal postural cueing. Neuro re-ed: Airex balance with no UE support x 30 seconds with 4 second holds. Airex balance with one UE support in tandem stance x 3 for 30 seconds. Obstacle course in the hallway with no assistive device and hand held assistance. Pt navigated cones (ascending cone taps) and stepping over and balancing on Airex x 2 laps with moderate handheld assistance and 2 laps with minimal handheld assistance.    Pt response to treatment for medical necessity: Pt will benefit from PT to increase balance for household and community ambulation, strength for navigating curbs and stairs at church, and endurance exercises to improve her walking distance.        PT Long Term Goals - 04/26/15 1801    PT LONG TERM GOAL #1   Title Pt will improve BERG balance score to > 50/56 in order to safely ambulate household distances without use of an AD.   Baseline 50/56 on 1/26   Time 4   Period Weeks   Status Achieved   PT LONG TERM GOAL #2   Title Pt will  improve LEFS to > 65/80 in order to report the ability to perform household chores without increased fatigue.   Baseline 48/80 on 12/29 45/80 on 1/26   Time 4   Period Weeks   Status Not Met   PT LONG TERM GOAL #3   Title Pt will be independent with HEP in order to increase L hip flexion strength by 1/2 MMT grade in order to ambulate safely.   Baseline 4-/5 on 1/26   Time 4   Period Weeks   Status Achieved   PT LONG TERM GOAL #4   Title Pt will consistently ambulate community distances with increased cadence and no LOB noted with least restrictive AD.   Time 4   Period Weeks   Status On-going   PT LONG TERM GOAL #5   Title Pt will perform a TUG in < 14 seconds without her SPC to ensure safety at home with functional mobility.   Time 4   Period Weeks    Status New            Plan - 05/15/15 1254    Clinical Impression Statement Pt continues to require frequent rest breaks throughout session but was able to take half of them in standing position today. Pt requires verbal and tactile cueing to prevent R hip ER with seated marches with 4# weight. Pt requires verbal cueing for B foot clearance with step overs during obstacle course but demonstrated good hip flexion with ascending cone taps. Pt was able to complete 2 laps of obstacle course with moderate assistance and 2 with minimal assistance. Pt demonstrates good ankle and hip strategy when feeling off balance when stepping onto and off of Airex foam pad. Pt fatigue continues to affect treatment sessions.   Pt will benefit from skilled therapeutic intervention in order to improve on the following deficits Abnormal gait;Decreased activity tolerance;Decreased balance;Decreased mobility;Decreased endurance;Improper body mechanics;Difficulty walking;Decreased range of motion;Postural dysfunction;Decreased strength;Dizziness   Rehab Potential Fair   PT Frequency 2x / week   PT Duration 4 weeks   PT Treatment/Interventions ADLs/Self Care Home Management;Cryotherapy;Moist Heat;Balance training;Therapeutic exercise;Therapeutic activities;Manual techniques;Functional mobility training;Gait training;Stair training;Neuromuscular re-education;Patient/family education   PT Next Visit Plan continue muscle strengthening/emphasize foot clearance with gait/decrease rest breaks and increase endurance/continue working on balance to work on habituation to decrease dizziness while on compliant surfaces   PT Home Exercise Plan continue current HEP   Consulted and Agree with Plan of Care Patient        Problem List Patient Active Problem List   Diagnosis Date Noted  . Encounter for preventive health examination 03/14/2015  . Breast cancer screening 03/14/2015  . Need for hepatitis C screening test 03/14/2015  .  COPD with hypoxia ([place]) 02/16/2015  . COPD exacerbation ([place]) 02/03/2015  . Vitamin D deficiency 01/11/2015  . S/P hysterectomy with oophorectomy 09/22/2013  . Pulmonary hypertension ([place]) 07/14/2013  . Edema 01/12/2013  . Hypothyroidism 05/04/2012  . Severe mitral regurgitation by prior echocardiogram 01/11/2012  . Obesity (BMI 30-39.9) 08/04/2011  . Depression   . Hypertension   . Congenital absence of one kidney   . History of sciatica    Pura Spice, PT, DPT # [phone]   05/16/2015, 3:11 PM  [facility] [address] [place], [place], [address] Phone: [phone]   Fax:  [phone]  Name: Lauren Bartlett MRN: [id number] Date of Birth: 10/02/1955

## 2015-05-17 ENCOUNTER — Encounter: Payer: Medicare Other | Admitting: Physical Therapy

## 2015-05-20 ENCOUNTER — Encounter: Payer: Self-pay | Admitting: Emergency Medicine

## 2015-05-20 ENCOUNTER — Emergency Department: Payer: Medicare Other

## 2015-05-20 ENCOUNTER — Emergency Department
Admission: EM | Admit: 2015-05-20 | Discharge: 2015-05-20 | Disposition: A | Discharge status: Home / Self Care | Payer: Medicare Other | Attending: Emergency Medicine | Admitting: Emergency Medicine

## 2015-05-20 DIAGNOSIS — J441 Chronic obstructive pulmonary disease with (acute) exacerbation: Secondary | ICD-10-CM | POA: Insufficient documentation

## 2015-05-20 DIAGNOSIS — Z880 Allergy status to penicillin: Secondary | ICD-10-CM | POA: Diagnosis not present

## 2015-05-20 DIAGNOSIS — I10 Essential (primary) hypertension: Secondary | ICD-10-CM | POA: Diagnosis not present

## 2015-05-20 DIAGNOSIS — Z7951 Long term (current) use of inhaled steroids: Secondary | ICD-10-CM | POA: Diagnosis not present

## 2015-05-20 DIAGNOSIS — Z792 Long term (current) use of antibiotics: Secondary | ICD-10-CM | POA: Diagnosis not present

## 2015-05-20 DIAGNOSIS — Z79899 Other long term (current) drug therapy: Secondary | ICD-10-CM | POA: Diagnosis not present

## 2015-05-20 DIAGNOSIS — R0602 Shortness of breath: Secondary | ICD-10-CM | POA: Diagnosis present

## 2015-05-20 DIAGNOSIS — J069 Acute upper respiratory infection, unspecified: Secondary | ICD-10-CM | POA: Diagnosis not present

## 2015-05-20 LAB — COMPREHENSIVE METABOLIC PANEL
ALBUMIN: 4 g/dL (ref 3.5–5.0)
ALT: 16 U/L (ref 14–54)
AST: 19 U/L (ref 15–41)
Alkaline Phosphatase: 62 U/L (ref 38–126)
Anion gap: 8 (ref 5–15)
BUN: 17 mg/dL (ref 6–20)
CHLORIDE: 104 mmol/L (ref 101–111)
CO2: 28 mmol/L (ref 22–32)
Calcium: 8.4 mg/dL — ABNORMAL LOW (ref 8.9–10.3)
Creatinine, Ser: 0.66 mg/dL (ref 0.44–1.00)
GFR calc Af Amer: >60 mL/min (ref >60)
Glucose, Bld: 99 mg/dL (ref 65–99)
POTASSIUM: 4.2 mmol/L (ref 3.5–5.1)
Sodium: 140 mmol/L (ref 135–145)
Total Bilirubin: 0.7 mg/dL (ref 0.3–1.2)
Total Protein: 7.2 g/dL (ref 6.5–8.1)

## 2015-05-20 LAB — CBC
HCT: 38.5 % (ref 35.0–47.0)
Hemoglobin: 12.5 g/dL (ref 12.0–16.0)
MCH: 29.9 pg (ref 26.0–34.0)
MCHC: 32.6 g/dL (ref 32.0–36.0)
MCV: 91.7 fL (ref 80.0–100.0)
PLATELETS: 169 10*3/uL (ref 150–440)
RBC: 4.2 MIL/uL (ref 3.80–5.20)
RDW: 14.6 % — AB (ref 11.5–14.5)
WBC: 9.5 10*3/uL (ref 3.6–11.0)

## 2015-05-20 LAB — TROPONIN I
TROPONIN I: 0.03 ng/mL (ref <0.031)
Troponin I: <0.03 ng/mL (ref <0.031)

## 2015-05-20 MED ORDER — IPRATROPIUM-ALBUTEROL 0.5-2.5 (3) MG/3ML IN SOLN
3.0000 mL | Freq: Once | RESPIRATORY_TRACT | Status: AC
  Administered 2015-05-20: 3 mL via RESPIRATORY_TRACT
  Filled 2015-05-20: qty 3

## 2015-05-20 MED ORDER — MAGNESIUM SULFATE 2 GM/50ML IV SOLN
2.0000 g | Freq: Once | INTRAVENOUS | Status: AC
  Administered 2015-05-20: 2 g via INTRAVENOUS
  Filled 2015-05-20: qty 50

## 2015-05-20 MED ORDER — ALBUTEROL SULFATE (2.5 MG/3ML) 0.083% IN NEBU
2.5000 mg | INHALATION_SOLUTION | Freq: Once | RESPIRATORY_TRACT | Status: AC
  Administered 2015-05-20: 2.5 mg via RESPIRATORY_TRACT
  Filled 2015-05-20: qty 3

## 2015-05-20 MED ORDER — AZITHROMYCIN 250 MG PO TABS: ORAL_TABLET | ORAL | Status: AC

## 2015-05-20 MED ORDER — DEXAMETHASONE SODIUM PHOSPHATE 10 MG/ML IJ SOLN
10.0000 mg | Freq: Once | INTRAMUSCULAR | Status: AC
  Administered 2015-05-20: 10 mg via INTRAVENOUS
  Filled 2015-05-20: qty 1

## 2015-05-20 MED ORDER — METHYLPREDNISOLONE SODIUM SUCC 125 MG IJ SOLR
125.0000 mg | Freq: Once | INTRAMUSCULAR | Status: AC
  Administered 2015-05-20: 125 mg via INTRAVENOUS
  Filled 2015-05-20: qty 2

## 2015-05-20 MED ORDER — LORAZEPAM 0.5 MG PO TABS
0.5000 mg | ORAL_TABLET | Freq: Once | ORAL | Status: AC
  Administered 2015-05-20: 0.5 mg via ORAL
  Filled 2015-05-20: qty 1

## 2015-05-20 MED ORDER — DEXTROSE 5 % IV SOLN
500.0000 mg | Freq: Once | INTRAVENOUS | Status: AC
  Administered 2015-05-20: 500 mg via INTRAVENOUS
  Filled 2015-05-20: qty 500

## 2015-05-20 NOTE — Discharge Instructions (Signed)

## 2015-05-20 NOTE — ED Notes (Signed)
Pt. Here via EMS.  Pt. States productive cough with shortness of breath.

## 2015-05-20 NOTE — ED Notes (Signed)
Pt verbalized understanding of discharge instructions. NAD at this time. 

## 2015-05-20 NOTE — ED Notes (Signed)
Helped pt to the bathroom in room.

## 2015-05-20 NOTE — ED Provider Notes (Signed)
[facility] Emergency Department Provider Note  ____________________________________________  Time seen: Approximately 344 AM  I have reviewed the triage vital signs and the nursing notes.   HISTORY  Chief Complaint Shortness of Breath    HPI Lauren Bartlett is a 60 y.o. female with a history of COPD comes into the hospital with some wheezing and breathing problems. The patient reports that she's had a cough and shortness of breath since yesterday but it seemed worse today. The patient has been using her nebulizer and oxygen at home but reports that it did not help today. She took her nebulizer twice tonight. The patient reports that she is allergic to prednisone and codeine. She reports that prednisone gives her fast heartbeat. The patient reports that she felt nauseous tonight and is unsure of the cause. The patient reports that she's been coughing up thick clear phlegm with no fever. The patient also reports that she's had no chest pain but does have some chest tightness. She has not felt sweaty or dizzy. The patient with 2 L of oxygen at home at night for her breathing problems. The patient came in tonight just to be evaluated for her symptoms as well as treated.   Past Medical History  Diagnosis Date  . Congenital absence of one kidney   . History of sciatica   . Depression     treated at [place]  . Hypertension   . hypothyrodism   . COPD (chronic obstructive pulmonary disease) ([place])   . Anxiety     Patient Active Problem List   Diagnosis Date Noted  . Encounter for preventive health examination 03/14/2015  . Breast cancer screening 03/14/2015  . Need for hepatitis C screening test 03/14/2015  . COPD with hypoxia ([place]) 02/16/2015  . COPD exacerbation ([place]) 02/03/2015  . Vitamin D deficiency 01/11/2015  . S/P hysterectomy with oophorectomy 09/22/2013  . Pulmonary hypertension ([place]) 07/14/2013  . Edema 01/12/2013  . Hypothyroidism 05/04/2012  .  Severe mitral regurgitation by prior echocardiogram 01/11/2012  . Obesity (BMI 30-39.9) 08/04/2011  . Depression   . Hypertension   . Congenital absence of one kidney   . History of sciatica     Past Surgical History  Procedure Laterality Date  . Combined hysterectomy abdominal w/ a&p repair / oophorectomy  1996    benign tumor  . Inner ear surgery      bilateral  . Abdominal hysterectomy    . Breast biopsy Right 2011    [facility]< benign    Current Outpatient Rx  Name  Route  Sig  Dispense  Refill  . albuterol (PROVENTIL HFA;VENTOLIN HFA) 108 (90 BASE) MCG/ACT inhaler   Inhalation   Inhale 2 puffs into the lungs every 6 (six) hours as needed for wheezing or shortness of breath. WITH A SPACER   1 Inhaler   0   . azithromycin (ZITHROMAX Z-PAK) 250 MG tablet      Take 2 tablets (500 mg) on  Day 1,  followed by 1 tablet (250 mg) once daily on Days 2 through 5.   6 each   0   . beclomethasone (QVAR) 40 MCG/ACT inhaler   Inhalation   Inhale 2 puffs into the lungs 2 (two) times daily.   1 Inhaler   0   . calcium-vitamin D (OSCAL WITH D) 500-200 MG-UNIT per tablet   Oral   Take 1 tablet by mouth daily with breakfast.   90 tablet   2   . doxycycline (  VIBRA-TABS) 100 MG tablet   Oral   Take 1 tablet (100 mg total) by mouth 2 (two) times daily.   20 tablet   0   . ergocalciferol (DRISDOL) 50000 UNITS capsule   Oral   Take 1 capsule (50,000 Units total) by mouth once a week.   12 capsule   0   . hydrochlorothiazide (HYDRODIURIL) 25 MG tablet      TAKE 1 TABLET(25 MG) BY MOUTH DAILY   30 tablet   4   . levocetirizine (XYZAL) 5 MG tablet   Oral   Take 1 tablet (5 mg total) by mouth every evening.   30 tablet   5   . levothyroxine (SYNTHROID, LEVOTHROID) 112 MCG tablet      TAKE 1 TABLET BY MOUTH EVERY DAY   30 tablet   9   . loratadine-pseudoephedrine (CLARITIN-D 24-HOUR) 10-240 MG 24 hr tablet   Oral   Take 1 tablet by mouth daily.         [name] losartan  (COZAAR) 100 MG tablet      TAKE 1 TABLET BY MOUTH DAILY   30 tablet   0   . sulfamethoxazole-trimethoprim (BACTRIM DS,SEPTRA DS) 800-160 MG tablet   Oral   Take 1 tablet by mouth 2 (two) times daily.   20 tablet   0   . Umeclidinium-Vilanterol (ANORO ELLIPTA) 62.5-25 MCG/INH AEPB   Inhalation   Inhale 1 puff into the lungs daily.           Allergies Codeine; Penicillin g; and Prednisone  Family History  Problem Relation Age of Onset  . Multiple sclerosis Mother   . Coronary artery disease Father   . Heart disease Brother   . Breast cancer Paternal Aunt     Social History Social History  Substance Use Topics  . Smoking status: Never Smoker   . Smokeless tobacco: Never Used  . Alcohol Use: No    Review of Systems Constitutional: No fever/chills Eyes: No visual changes. ENT: No sore throat. Cardiovascular: Chest tightness Respiratory: shortness of breath. Gastrointestinal: No abdominal pain.  No nausea, no vomiting.  No diarrhea.  No constipation. Genitourinary: Negative for dysuria. Musculoskeletal: Negative for back pain. Skin: Negative for rash. Neurological: Negative for headaches, focal weakness or numbness.  10-point ROS otherwise negative.  ____________________________________________   PHYSICAL EXAM:  VITAL SIGNS: ED Triage Vitals  Enc Vitals Group     BP 05/20/15 0334 132/72 mmHg     Pulse Rate 05/20/15 0334 95     Resp 05/20/15 0334 26     Temp 05/20/15 0334 97.6 F (36.4 C)     Temp Source 05/20/15 0334 Oral     SpO2 05/20/15 0334 96 %     Weight 05/20/15 0334 200 lb (90.719 kg)     Height 05/20/15 0334 5\' 2"  (1.575 m)     Head Cir --      Peak Flow --      Pain Score 05/20/15 0336 0     Pain Loc --      Pain Edu? --      Excl. in [place]? --     Constitutional: Alert and oriented. Well appearing and in moderate distress. Eyes: Conjunctivae are normal. PERRL. EOMI. Head: Atraumatic. Nose: No congestion/rhinnorhea. Mouth/Throat:  Mucous membranes are moist.  Oropharynx non-erythematous. Cardiovascular: Normal rate, regular rhythm. Grossly normal heart sounds.  Good peripheral circulation. Respiratory: increased respiratory effort.  No retractions. Expiratory wheezes throughout all lung fields Gastrointestinal: Soft  and nontender. No distention. Positive bowel sounds Musculoskeletal: No lower extremity tenderness nor edema.   Neurologic:  Normal speech and language.  Skin:  Skin is warm, dry and intact. Psychiatric: Mood and affect are normal.   ____________________________________________   LABS (all labs ordered are listed, but only abnormal results are displayed)  Labs Reviewed  CBC - Abnormal; Notable for the following:    RDW 14.6 (*)    All other components within normal limits  COMPREHENSIVE METABOLIC PANEL - Abnormal; Notable for the following:    Calcium 8.4 (*)    All other components within normal limits  BLOOD GAS, VENOUS - Abnormal; Notable for the following:    Bicarbonate 29.4 (*)    All other components within normal limits  TROPONIN I  TROPONIN I   ____________________________________________  EKG  ED ECG REPORT I, Loney Hering, the attending physician, personally viewed and interpreted this ECG.   Date: 05/20/2015  EKG Time: 407  Rate: 87  Rhythm: normal sinus rhythm  Axis: Normal  Intervals:none  ST&T Change: None  ____________________________________________  RADIOLOGY  CXR: No active cardiopulmonary disease ____________________________________________   PROCEDURES  Procedure(s) performed: None  Critical Care performed: No  ____________________________________________   INITIAL IMPRESSION / ASSESSMENT AND PLAN / ED COURSE  Pertinent labs & imaging results that were available during my care of the patient were reviewed by me and considered in my medical decision making (see chart for details).  This is a 60 year old female who comes into the hospital  today with some shortness of breath and chest tightness. The patient reports that she was having some wheezing on exam she does have some wheezing. She did receive some DuoNeb on EMS but I will give her 2 more DuoNeb's wall she is here in the emergency department as well as some magnesium sulfate.  After I reassessed the patient she continued to have some wheezing. I decided to give the patient dose of Solu-Medrol IV as well as azithromycin. I will reassess the patient again.  The patient's wheezing is all improved. She reports that she feels unwell because of her congestion but her wheezing is improved and her blood work is unremarkable. She'll be discharged home. ____________________________________________   FINAL CLINICAL IMPRESSION(S) / ED DIAGNOSES  Final diagnoses:  COPD exacerbation ([place])  Upper respiratory infection      Loney Hering, MD 05/20/15 [phone]

## 2015-05-20 NOTE — ED Notes (Signed)
Was on break when called to do EKG was told ok to wait til done

## 2015-05-22 ENCOUNTER — Telehealth: Payer: Self-pay | Admitting: *Deleted

## 2015-05-22 ENCOUNTER — Ambulatory Visit: Payer: Medicare Other | Admitting: Physical Therapy

## 2015-05-22 ENCOUNTER — Encounter: Payer: Self-pay | Admitting: Physical Therapy

## 2015-05-22 DIAGNOSIS — R531 Weakness: Secondary | ICD-10-CM | POA: Diagnosis not present

## 2015-05-22 DIAGNOSIS — R5381 Other malaise: Secondary | ICD-10-CM

## 2015-05-22 DIAGNOSIS — R262 Difficulty in walking, not elsewhere classified: Secondary | ICD-10-CM

## 2015-05-22 NOTE — Therapy (Signed)
[facility] [address] [place], [place], [address] Phone: [phone]   Fax:  [phone]  Physical Therapy Treatment  Patient Details  Name: Lauren Bartlett MRN: [id number] Date of Birth: 28-Oct-1955 Referring Provider: Dr. Volanda Napoleon  Encounter Date: 05/22/2015      PT End of Session - 05/22/15 0916    Visit Number 20   Number of Visits 24   Date for PT Re-Evaluation 05/24/15   Authorization - Visit Number 20   Authorization - Number of Visits 24   PT Start Time 0630   PT Stop Time 0950   PT Time Calculation (min) 56 min   Equipment Utilized During Treatment Gait belt   Activity Tolerance No increased pain;Patient tolerated treatment well   Behavior During Therapy Flat affect      Past Medical History  Diagnosis Date  . Congenital absence of one kidney   . History of sciatica   . Depression     treated at [place]  . Hypertension   . hypothyrodism   . COPD (chronic obstructive pulmonary disease) ([place])   . Anxiety     Past Surgical History  Procedure Laterality Date  . Combined hysterectomy abdominal w/ a&p repair / oophorectomy  1996    benign tumor  . Inner ear surgery      bilateral  . Abdominal hysterectomy    . Breast biopsy Right 2011    [facility]< benign    There were no vitals filed for this visit.  Visit Diagnosis:  Generalized weakness  Physical deconditioning  Difficulty walking      Subjective Assessment - 05/22/15 0914    Subjective Pt. states she went to ER this past weekend due to breathing issues.  Pt. states she was put on antibiotics and is doing better today.  O2 sat.: 99% this morning during Nustep.     Limitations Walking;House hold activities   Patient Stated Goals get back to PLOF/back to do like she used to with house work without being tired/walking program/walk without AD   Currently in Pain? No/denies      OBJECTIVE: warm up: Nustep L7 10 mins (no charge/warm-up)- O2 sat.: 99%.  Pt  required postural cueing throughout treatment session. Ther ex: Forwards and lateral step ups x 20 on 6" step in //-bars with good eccentric quad control noted. Seated marches with 5# weight x 20 with cueing to prevent R hip ER.  Neuro re-ed: Overhead reaching with cone x 2 on compliant surface and x 2 on Airex foam pad with pt demonstrating good hip and ankle strategy. Obstacle course with airex foam pad, step overs, cone tapping, and cone weaving with bag with 5# weight to simulate carrying a gallon of milk x 3 laps with moderate handheld assistance x 1 lap with minimal-no handheld assistance.  Pt response to treatment for medical necessity: Pt will benefit from PT to increase balance for household and community ambulation, strength for navigating curbs and stairs at church, and endurance exercises to improve her walking distance.        PT Long Term Goals - 04/26/15 1801    PT LONG TERM GOAL #1   Title Pt will improve BERG balance score to > 50/56 in order to safely ambulate household distances without use of an AD.   Baseline 50/56 on 1/26   Time 4   Period Weeks   Status Achieved   PT LONG TERM GOAL #2   Title Pt will improve LEFS  to > 65/80 in order to report the ability to perform household chores without increased fatigue.   Baseline 48/80 on 12/29 45/80 on 1/26   Time 4   Period Weeks   Status Not Met   PT LONG TERM GOAL #3   Title Pt will be independent with HEP in order to increase L hip flexion strength by 1/2 MMT grade in order to ambulate safely.   Baseline 4-/5 on 1/26   Time 4   Period Weeks   Status Achieved   PT LONG TERM GOAL #4   Title Pt will consistently ambulate community distances with increased cadence and no LOB noted with least restrictive AD.   Time 4   Period Weeks   Status On-going   PT LONG TERM GOAL #5   Title Pt will perform a TUG in < 14 seconds without her SPC to ensure safety at home with functional mobility.   Time 4   Period Weeks   Status New             Plan - 05/22/15 1715    Clinical Impression Statement Pt O2 sat at 99% at beginning of treatment session. Pt demonstrated increased endurance today requiring fewer rest breaks and was able to take standing rest breaks if needed. Pt requires postural cueing throughout treatment session. Pt demonstrates good B hip flexion with obstacle course with minimal cueing required but has trouble with single leg balance with cone taps. Pt required moderate handheld assistance for 3 laps of obstacle course but was able to complete last lap with minimal-no handheld assistance from PT. Pt shows good eccentric quad control with step downs on 6" step   Pt will benefit from skilled therapeutic intervention in order to improve on the following deficits Abnormal gait;Decreased activity tolerance;Decreased balance;Decreased mobility;Decreased endurance;Improper body mechanics;Difficulty walking;Decreased range of motion;Postural dysfunction;Decreased strength;Dizziness   Rehab Potential Fair   PT Frequency 2x / week   PT Duration 4 weeks   PT Treatment/Interventions ADLs/Self Care Home Management;Cryotherapy;Moist Heat;Balance training;Therapeutic exercise;Therapeutic activities;Manual techniques;Functional mobility training;Gait training;Stair training;Neuromuscular re-education;Patient/family education   PT Next Visit Plan continue muscle strengthening/emphasize foot clearance with gait/decrease rest breaks and increase endurance/continue working on balance to work on habituation to decrease dizziness while on compliant surfaces   PT Home Exercise Plan continue current HEP   Consulted and Agree with Plan of Care Patient        Problem List Patient Active Problem List   Diagnosis Date Noted  . Encounter for preventive health examination 03/14/2015  . Breast cancer screening 03/14/2015  . Need for hepatitis C screening test 03/14/2015  . COPD with hypoxia ([place]) 02/16/2015  . COPD exacerbation  ([place]) 02/03/2015  . Vitamin D deficiency 01/11/2015  . S/P hysterectomy with oophorectomy 09/22/2013  . Pulmonary hypertension ([place]) 07/14/2013  . Edema 01/12/2013  . Hypothyroidism 05/04/2012  . Severe mitral regurgitation by prior echocardiogram 01/11/2012  . Obesity (BMI 30-39.9) 08/04/2011  . Depression   . Hypertension   . Congenital absence of one kidney   . History of sciatica    Pura Spice, PT, DPT # [id number]   05/23/2015, 2:14 PM  [facility] [address] [place], [place], [address] Phone: [phone]   Fax:  [phone]  Name: Lauren Bartlett MRN: [id number] Date of Birth: 01/14/1956

## 2015-05-22 NOTE — Telephone Encounter (Signed)
Patient was seen in the Emergency Room for upper raspatory infection, 05/19/15. She was given Pac, and stated that the medication has caused her to have hallucinations and nightmares. She has requested Dr. Derrel Nip prescribe her an antibiotic . Please advise  Pt. [phone]

## 2015-05-23 LAB — BLOOD GAS, VENOUS
ACID-BASE EXCESS: 2.8 mmol/L (ref 0.0–3.0)
BICARBONATE: 29.4 meq/L — AB (ref 21.0–28.0)
PCO2 VEN: 52 mmHg (ref 44.0–60.0)
Patient temperature: 37
pH, Ven: 7.36 (ref 7.320–7.430)

## 2015-05-23 NOTE — Telephone Encounter (Signed)
Notified pt. 

## 2015-05-23 NOTE — Telephone Encounter (Signed)
I reviewed her ER visit.  She does not need any more antibitoics since she had 3 days of azithromycin and the labs and chest x ray were normal

## 2015-05-23 NOTE — Telephone Encounter (Signed)
Reason for call: Medication reaction to Z-Pak Symptoms: pt states that she is having nightmares and hallucinations since taking  Duration: x3days Medications: azithromycin Last seen for this problem: 05/20/15 Seen by: [facility] ED  Pt is requesting a medication change. Please advise, thanks

## 2015-05-24 ENCOUNTER — Ambulatory Visit: Payer: Medicare Other | Admitting: Physical Therapy

## 2015-05-24 ENCOUNTER — Encounter: Payer: Self-pay | Admitting: Physical Therapy

## 2015-05-24 DIAGNOSIS — R262 Difficulty in walking, not elsewhere classified: Secondary | ICD-10-CM

## 2015-05-24 DIAGNOSIS — R531 Weakness: Secondary | ICD-10-CM | POA: Diagnosis not present

## 2015-05-24 DIAGNOSIS — R5381 Other malaise: Secondary | ICD-10-CM

## 2015-05-24 NOTE — Therapy (Signed)
[facility] [address] [place], [place], [address] Phone: [phone]   Fax:  [phone]  Physical Therapy Treatment  Patient Details  Name: Lauren Bartlett MRN: [id number] Date of Birth: December 13, 1955 Referring Provider: Dr. Volanda Napoleon  Encounter Date: 05/24/2015      PT End of Session - 05/24/15 0907    Visit Number 21   Number of Visits 24   Date for PT Re-Evaluation 05/24/15   Authorization - Visit Number 21   Authorization - Number of Visits 24   PT Start Time 0902   PT Stop Time 0959   PT Time Calculation (min) 57 min   Equipment Utilized During Treatment Gait belt   Activity Tolerance No increased pain;Patient tolerated treatment well   Behavior During Therapy Flat affect      Past Medical History  Diagnosis Date  . Congenital absence of one kidney   . History of sciatica   . Depression     treated at [place]  . Hypertension   . hypothyrodism   . COPD (chronic obstructive pulmonary disease) ([place])   . Anxiety     Past Surgical History  Procedure Laterality Date  . Combined hysterectomy abdominal w/ a&p repair / oophorectomy  1996    benign tumor  . Inner ear surgery      bilateral  . Abdominal hysterectomy    . Breast biopsy Right 2011    [facility]< benign    There were no vitals filed for this visit.  Visit Diagnosis:  Generalized weakness  Physical deconditioning  Difficulty walking      Subjective Assessment - 05/24/15 0905    Subjective Pt reports no new complaints of pain. Pt reports she is having a good day and has been able to walk outside the past couple days.   Limitations Walking;House hold activities   Patient Stated Goals get back to PLOF/back to do like she used to with house work without being tired/walking program/walk without AD   Currently in Pain? No/denies        OBJECTIVE: warm up: Nustep L6 10 mins (no charge/warm-up)- O2 sat.: 98%. Step touches/ turning/ Berg reassessment.  Amb.  Outside on varying terrain with/without use of SPC for safety.  No LOB and consistent cadence.  There.ex.: Reviewed HEP in depth.  Standing hip/knee ex. Step ups/downs.  Discussed walking program.    LEFS: 48 out of 80.  BERG Balance: 50/56.    Pt response to treatment for medical necessity: Discharge from PT at this time with focus on daily walking/ HEP.  Pt. Instructed to contact PT if any further issues.              PT Long Term Goals - 05/24/15 1002    PT LONG TERM GOAL #1   Title Pt will improve BERG balance score to > 50/56 in order to safely ambulate household distances without use of an AD.   Baseline 50/56 on 1/26 50/56 on 2/23   Time 4   Period Weeks   Status Achieved   PT LONG TERM GOAL #2   Title Pt will improve LEFS to > 65/80 in order to report the ability to perform household chores without increased fatigue.   Baseline 48/80 on 12/29. 45/80 on 1/26. 48/80 on 2/23   Time 4   Period Weeks   Status Not Met   PT LONG TERM GOAL #3   Title Pt will be independent with HEP in order to increase  L hip flexion strength by 1/2 MMT grade in order to ambulate safely.   Baseline 4-/5 on 1/26. 4-/5 on 2/23.   Time 4   Period Weeks   Status Achieved   PT LONG TERM GOAL #4   Title Pt will consistently ambulate community distances with increased cadence and no LOB noted with least restrictive AD.   Baseline pt walked one lap and a half aorund clinic on 2/23   Time 4   Period Weeks   Status Achieved   PT LONG TERM GOAL #5   Title Pt will perform a TUG in < 14 seconds without her SPC to ensure safety at home with functional mobility.   Baseline 18.33 seconds on 2/23   Time 4   Period Weeks   Status Not Met               Plan - 05/24/15 0908    Clinical Impression Statement Pt O2 sats at 96%-100% throughout treatment session.  Pt. did well with outside gait and overall endurance with no change in O2 sats.  Pt. showing signs of being at max. physical potential and  will continue to benefit from daily walking/ HEP.   Berg 50/56.  LEFS: 48 out of 80.  TUG: 18.3 sec.     Pt will benefit from skilled therapeutic intervention in order to improve on the following deficits Abnormal gait;Decreased activity tolerance;Decreased balance;Decreased mobility;Decreased endurance;Improper body mechanics;Difficulty walking;Decreased range of motion;Postural dysfunction;Decreased strength;Dizziness   Rehab Potential Fair   PT Frequency 2x / week   PT Duration 4 weeks   PT Treatment/Interventions ADLs/Self Care Home Management;Cryotherapy;Moist Heat;Balance training;Therapeutic exercise;Therapeutic activities;Manual techniques;Functional mobility training;Gait training;Stair training;Neuromuscular re-education;Patient/family education   PT Next Visit Plan Discharge from PT at this time.    PT Home Exercise Plan continue current HEP   Consulted and Agree with Plan of Care Patient          G-Codes - 06/21/15 1324    Functional Assessment Tool Used Clinical judgement/ gait/ Berg/ LEFS   Functional Limitation Mobility: Walking and moving around   Mobility: Walking and Moving Around Current Status [phone]) At least 1 percent but less than 20 percent impaired, limited or restricted   Mobility: Walking and Moving Around Goal Status [phone]) At least 1 percent but less than 20 percent impaired, limited or restricted   Mobility: Walking and Moving Around Discharge Status [phone]) At least 1 percent but less than 20 percent impaired, limited or restricted      Problem List Patient Active Problem List   Diagnosis Date Noted  . Encounter for preventive health examination 03/14/2015  . Breast cancer screening 03/14/2015  . Need for hepatitis C screening test 03/14/2015  . COPD with hypoxia ([place]) 02/16/2015  . COPD exacerbation ([place]) 02/03/2015  . Vitamin D deficiency 01/11/2015  . S/P hysterectomy with oophorectomy 09/22/2013  . Pulmonary hypertension ([place]) 07/14/2013  . Edema  01/12/2013  . Hypothyroidism 05/04/2012  . Severe mitral regurgitation by prior echocardiogram 01/11/2012  . Obesity (BMI 30-39.9) 08/04/2011  . Depression   . Hypertension   . Congenital absence of one kidney   . History of sciatica    Pura Spice, PT, DPT # [phone]   2015-06-21, 1:25 PM  [facility] [facility] [address] [place], [place], [address] Phone: [phone]   Fax:  [phone]  Name: Lorene Klimas MRN: [id number] Date of Birth: 1955-04-27

## 2015-05-28 ENCOUNTER — Telehealth: Payer: Self-pay | Admitting: *Deleted

## 2015-05-28 ENCOUNTER — Other Ambulatory Visit: Payer: Self-pay

## 2015-05-28 MED ORDER — LOSARTAN POTASSIUM 100 MG PO TABS: 100.0000 mg | ORAL_TABLET | Freq: Every day | ORAL | Status: DC

## 2015-05-28 NOTE — Telephone Encounter (Signed)
Losartan filled

## 2015-05-28 NOTE — Telephone Encounter (Signed)
Medication refill for losartan Pharmacy [facility] in [place]

## 2015-05-28 NOTE — Telephone Encounter (Signed)
Lorsartan filled

## 2015-06-03 ENCOUNTER — Inpatient Hospital Stay
Admission: EM | Admit: 2015-06-03 | Discharge: 2015-06-05 | DRG: 190 | Disposition: A | Discharge status: Home / Self Care | Payer: Medicare Other | Attending: Internal Medicine | Admitting: Internal Medicine

## 2015-06-03 ENCOUNTER — Emergency Department: Payer: Medicare Other

## 2015-06-03 ENCOUNTER — Encounter: Payer: Self-pay | Admitting: Emergency Medicine

## 2015-06-03 DIAGNOSIS — Z8249 Family history of ischemic heart disease and other diseases of the circulatory system: Secondary | ICD-10-CM | POA: Diagnosis not present

## 2015-06-03 DIAGNOSIS — Z820 Family history of epilepsy and other diseases of the nervous system: Secondary | ICD-10-CM

## 2015-06-03 DIAGNOSIS — J440 Chronic obstructive pulmonary disease with acute lower respiratory infection: Secondary | ICD-10-CM | POA: Diagnosis present

## 2015-06-03 DIAGNOSIS — J441 Chronic obstructive pulmonary disease with (acute) exacerbation: Principal | ICD-10-CM | POA: Diagnosis present

## 2015-06-03 DIAGNOSIS — I272 Other secondary pulmonary hypertension: Secondary | ICD-10-CM | POA: Diagnosis present

## 2015-06-03 DIAGNOSIS — Z809 Family history of malignant neoplasm, unspecified: Secondary | ICD-10-CM

## 2015-06-03 DIAGNOSIS — E039 Hypothyroidism, unspecified: Secondary | ICD-10-CM | POA: Diagnosis present

## 2015-06-03 DIAGNOSIS — J45909 Unspecified asthma, uncomplicated: Secondary | ICD-10-CM | POA: Diagnosis present

## 2015-06-03 DIAGNOSIS — J209 Acute bronchitis, unspecified: Secondary | ICD-10-CM

## 2015-06-03 DIAGNOSIS — Z9981 Dependence on supplemental oxygen: Secondary | ICD-10-CM | POA: Diagnosis not present

## 2015-06-03 DIAGNOSIS — F419 Anxiety disorder, unspecified: Secondary | ICD-10-CM | POA: Diagnosis present

## 2015-06-03 DIAGNOSIS — Z888 Allergy status to other drugs, medicaments and biological substances status: Secondary | ICD-10-CM

## 2015-06-03 DIAGNOSIS — J9600 Acute respiratory failure, unspecified whether with hypoxia or hypercapnia: Secondary | ICD-10-CM

## 2015-06-03 DIAGNOSIS — I10 Essential (primary) hypertension: Secondary | ICD-10-CM | POA: Diagnosis present

## 2015-06-03 DIAGNOSIS — R0602 Shortness of breath: Secondary | ICD-10-CM

## 2015-06-03 DIAGNOSIS — J9621 Acute and chronic respiratory failure with hypoxia: Secondary | ICD-10-CM | POA: Diagnosis present

## 2015-06-03 HISTORY — DX: Emphysema, unspecified: J43.9

## 2015-06-03 LAB — CBC WITH DIFFERENTIAL/PLATELET
Basophils Absolute: 0.1 10*3/uL (ref 0–0.1)
Basophils Relative: 1 %
EOS PCT: 8 %
Eosinophils Absolute: 0.8 10*3/uL — ABNORMAL HIGH (ref 0–0.7)
HEMATOCRIT: 39 % (ref 35.0–47.0)
HEMOGLOBIN: 12.9 g/dL (ref 12.0–16.0)
LYMPHS ABS: 1.1 10*3/uL (ref 1.0–3.6)
LYMPHS PCT: 12 %
MCH: 29.5 pg (ref 26.0–34.0)
MCHC: 33.1 g/dL (ref 32.0–36.0)
MCV: 89.3 fL (ref 80.0–100.0)
Monocytes Absolute: 0.7 10*3/uL (ref 0.2–0.9)
Monocytes Relative: 7 %
Neutro Abs: 7.1 10*3/uL — ABNORMAL HIGH (ref 1.4–6.5)
Neutrophils Relative %: 72 %
PLATELETS: 154 10*3/uL (ref 150–440)
RBC: 4.37 MIL/uL (ref 3.80–5.20)
RDW: 14.6 % — ABNORMAL HIGH (ref 11.5–14.5)
WBC: 9.8 10*3/uL (ref 3.6–11.0)

## 2015-06-03 LAB — COMPREHENSIVE METABOLIC PANEL
ALBUMIN: 4.1 g/dL (ref 3.5–5.0)
ALT: 17 U/L (ref 14–54)
AST: 21 U/L (ref 15–41)
Alkaline Phosphatase: 70 U/L (ref 38–126)
Anion gap: 9 (ref 5–15)
BUN: 17 mg/dL (ref 6–20)
CHLORIDE: 103 mmol/L (ref 101–111)
CO2: 29 mmol/L (ref 22–32)
Calcium: 8.4 mg/dL — ABNORMAL LOW (ref 8.9–10.3)
Creatinine, Ser: 0.84 mg/dL (ref 0.44–1.00)
GFR calc Af Amer: >60 mL/min (ref >60)
Glucose, Bld: 100 mg/dL — ABNORMAL HIGH (ref 65–99)
POTASSIUM: 4.1 mmol/L (ref 3.5–5.1)
Sodium: 141 mmol/L (ref 135–145)
Total Bilirubin: 0.6 mg/dL (ref 0.3–1.2)
Total Protein: 7.5 g/dL (ref 6.5–8.1)

## 2015-06-03 LAB — TROPONIN I

## 2015-06-03 LAB — BRAIN NATRIURETIC PEPTIDE: B NATRIURETIC PEPTIDE 5: 66 pg/mL (ref 0.0–100.0)

## 2015-06-03 MED ORDER — LORATADINE 10 MG PO TABS
10.0000 mg | ORAL_TABLET | Freq: Every day | ORAL | Status: DC
  Administered 2015-06-03 – 2015-06-05 (×3): 10 mg via ORAL
  Filled 2015-06-03 (×3): qty 1

## 2015-06-03 MED ORDER — DEXTROSE 5 % IV SOLN
500.0000 mg | INTRAVENOUS | Status: DC
  Filled 2015-06-03 (×2): qty 500

## 2015-06-03 MED ORDER — LEVOCETIRIZINE DIHYDROCHLORIDE 5 MG PO TABS: 5.0000 mg | ORAL_TABLET | Freq: Every evening | ORAL | Status: DC

## 2015-06-03 MED ORDER — LORAZEPAM 2 MG/ML IJ SOLN
0.5000 mg | INTRAMUSCULAR | Status: DC | PRN
  Filled 2015-06-03: qty 1

## 2015-06-03 MED ORDER — HEPARIN SODIUM (PORCINE) 5000 UNIT/ML IJ SOLN
5000.0000 [IU] | Freq: Three times a day (TID) | INTRAMUSCULAR | Status: DC
  Administered 2015-06-03 – 2015-06-04 (×4): 5000 [IU] via SUBCUTANEOUS
  Filled 2015-06-03 (×4): qty 1

## 2015-06-03 MED ORDER — IPRATROPIUM-ALBUTEROL 0.5-2.5 (3) MG/3ML IN SOLN
3.0000 mL | Freq: Once | RESPIRATORY_TRACT | Status: AC
  Administered 2015-06-03: 3 mL via RESPIRATORY_TRACT
  Filled 2015-06-03: qty 3

## 2015-06-03 MED ORDER — GUAIFENESIN ER 600 MG PO TB12
600.0000 mg | ORAL_TABLET | Freq: Two times a day (BID) | ORAL | Status: DC
  Administered 2015-06-03 – 2015-06-05 (×4): 600 mg via ORAL
  Filled 2015-06-03 (×7): qty 1

## 2015-06-03 MED ORDER — LEVOFLOXACIN IN D5W 500 MG/100ML IV SOLN
500.0000 mg | INTRAVENOUS | Status: DC
  Administered 2015-06-03: 500 mg via INTRAVENOUS
  Filled 2015-06-03 (×2): qty 100

## 2015-06-03 MED ORDER — CALCIUM CARBONATE-VITAMIN D 500-200 MG-UNIT PO TABS
1.0000 | ORAL_TABLET | Freq: Every day | ORAL | Status: DC
Start: 2015-06-04 — End: 2015-06-05
  Administered 2015-06-04 – 2015-06-05 (×2): 1 via ORAL
  Filled 2015-06-03 (×2): qty 1

## 2015-06-03 MED ORDER — ASPIRIN EC 81 MG PO TBEC
81.0000 mg | DELAYED_RELEASE_TABLET | Freq: Every day | ORAL | Status: DC
  Administered 2015-06-03 – 2015-06-05 (×3): 81 mg via ORAL
  Filled 2015-06-03 (×3): qty 1

## 2015-06-03 MED ORDER — IPRATROPIUM-ALBUTEROL 0.5-2.5 (3) MG/3ML IN SOLN
3.0000 mL | RESPIRATORY_TRACT | Status: DC
  Administered 2015-06-03 – 2015-06-05 (×9): 3 mL via RESPIRATORY_TRACT
  Filled 2015-06-03 (×10): qty 3

## 2015-06-03 MED ORDER — LOSARTAN POTASSIUM 50 MG PO TABS
100.0000 mg | ORAL_TABLET | Freq: Every day | ORAL | Status: DC
  Administered 2015-06-03 – 2015-06-05 (×3): 100 mg via ORAL
  Filled 2015-06-03 (×3): qty 2

## 2015-06-03 MED ORDER — UMECLIDINIUM-VILANTEROL 62.5-25 MCG/INH IN AEPB
1.0000 | INHALATION_SPRAY | Freq: Every day | RESPIRATORY_TRACT | Status: DC
  Administered 2015-06-04 – 2015-06-05 (×2): 1 via RESPIRATORY_TRACT
  Filled 2015-06-03: qty 14

## 2015-06-03 MED ORDER — LEVOTHYROXINE SODIUM 112 MCG PO TABS: 112.0000 ug | ORAL_TABLET | Freq: Every day | ORAL | Status: DC

## 2015-06-03 MED ORDER — HYDROCHLOROTHIAZIDE 25 MG PO TABS
25.0000 mg | ORAL_TABLET | Freq: Every day | ORAL | Status: DC
  Administered 2015-06-03 – 2015-06-05 (×3): 25 mg via ORAL
  Filled 2015-06-03 (×3): qty 1

## 2015-06-03 MED ORDER — ONDANSETRON HCL 4 MG/2ML IJ SOLN
4.0000 mg | Freq: Four times a day (QID) | INTRAMUSCULAR | Status: DC | PRN
  Administered 2015-06-03 – 2015-06-04 (×3): 4 mg via INTRAVENOUS
  Filled 2015-06-03 (×3): qty 2

## 2015-06-03 MED ORDER — BISACODYL 10 MG RE SUPP
10.0000 mg | Freq: Every day | RECTAL | Status: DC | PRN
  Administered 2015-06-05: 05:00:00 10 mg via RECTAL
  Filled 2015-06-03: qty 1

## 2015-06-03 MED ORDER — ONDANSETRON HCL 4 MG PO TABS: 4.0000 mg | ORAL_TABLET | Freq: Four times a day (QID) | ORAL | Status: DC | PRN

## 2015-06-03 MED ORDER — MORPHINE SULFATE (PF) 2 MG/ML IV SOLN: 2.0000 mg | INTRAVENOUS | Status: DC | PRN

## 2015-06-03 MED ORDER — ALBUTEROL SULFATE (2.5 MG/3ML) 0.083% IN NEBU
3.0000 mL | INHALATION_SOLUTION | RESPIRATORY_TRACT | Status: DC | PRN
  Administered 2015-06-04 – 2015-06-05 (×2): 3 mL via RESPIRATORY_TRACT
  Filled 2015-06-03 (×2): qty 3

## 2015-06-03 MED ORDER — LEVOTHYROXINE SODIUM 112 MCG PO TABS
112.0000 ug | ORAL_TABLET | Freq: Every day | ORAL | Status: DC
  Administered 2015-06-04 – 2015-06-05 (×2): 112 ug via ORAL
  Filled 2015-06-03 (×3): qty 1

## 2015-06-03 MED ORDER — ACETAMINOPHEN 325 MG PO TABS
650.0000 mg | ORAL_TABLET | Freq: Four times a day (QID) | ORAL | Status: DC | PRN
  Administered 2015-06-03: 650 mg via ORAL
  Filled 2015-06-03: qty 2

## 2015-06-03 MED ORDER — MAGNESIUM SULFATE 2 GM/50ML IV SOLN
2.0000 g | Freq: Once | INTRAVENOUS | Status: AC
  Administered 2015-06-03: 2 g via INTRAVENOUS
  Filled 2015-06-03: qty 50

## 2015-06-03 MED ORDER — MONTELUKAST SODIUM 10 MG PO TABS
10.0000 mg | ORAL_TABLET | Freq: Every day | ORAL | Status: DC
  Administered 2015-06-03 – 2015-06-04 (×2): 10 mg via ORAL
  Filled 2015-06-03 (×2): qty 1

## 2015-06-03 MED ORDER — ACETAMINOPHEN 650 MG RE SUPP: 650.0000 mg | Freq: Four times a day (QID) | RECTAL | Status: DC | PRN

## 2015-06-03 MED ORDER — DOCUSATE SODIUM 100 MG PO CAPS
100.0000 mg | ORAL_CAPSULE | Freq: Two times a day (BID) | ORAL | Status: DC
  Administered 2015-06-03 – 2015-06-05 (×5): 100 mg via ORAL
  Filled 2015-06-03 (×5): qty 1

## 2015-06-03 MED ORDER — ALBUTEROL SULFATE (2.5 MG/3ML) 0.083% IN NEBU: 5.0000 mg | INHALATION_SOLUTION | Freq: Once | RESPIRATORY_TRACT | Status: DC

## 2015-06-03 NOTE — ED Notes (Signed)
MD at bedside. 

## 2015-06-03 NOTE — H&P (Signed)
History and Physical    Lauren Bartlett [id number] DOB: 15-Jun-1955 DOA: 06/03/2015  Referring physician: Dr. Cinda Quest PCP: [name], MD  Specialists: Dr. Raul Del  Chief Complaint: SOB  HPI: Lauren Bartlett is a 60 y.o. female has a past medical history significant for COPD/asthma, HTN, and anxiety now with 2 day hx of worsening SOB and cough. No fever. Denies CP. No N/V/D. Allergic to steroids. Presents to ER where she is in acute respiratory distress with tachypnea and hypoxia. Unable to speak in full sentences. Sat on RA=73% with exertion. She is now admitted.  Review of Systems: The patient denies anorexia, fever, weight loss,, vision loss, decreased hearing, hoarseness, chest pain, syncope, peripheral edema, balance deficits, hemoptysis, abdominal pain, melena, hematochezia, severe indigestion/heartburn, hematuria, incontinence, genital sores, muscle weakness, suspicious skin lesions, transient blindness, difficulty walking, depression, unusual weight change, abnormal bleeding, enlarged lymph nodes, angioedema, and breast masses.   Past Medical History  Diagnosis Date  . Congenital absence of one kidney   . History of sciatica   . Depression     treated at [place]  . Hypertension   . hypothyrodism   . COPD (chronic obstructive pulmonary disease) ([place])   . Anxiety   . Emphysema/COPD [facility])    Past Surgical History  Procedure Laterality Date  . Combined hysterectomy abdominal w/ a&p repair / oophorectomy  1996    benign tumor  . Inner ear surgery      bilateral  . Abdominal hysterectomy    . Breast biopsy Right 2011    [facility]< benign   Social History:  reports that she has never smoked. She has never used smokeless tobacco. She reports that she does not drink alcohol or use illicit drugs.  Allergies  Allergen Reactions  . Codeine Hives and Nausea And Vomiting  . Penicillin G Itching and Swelling    Has patient had a PCN reaction causing immediate rash,  facial/tongue/throat swelling, SOB or lightheadedness with hypotension: Yes Has patient had a PCN reaction causing severe rash involving mucus membranes or skin necrosis: No Has patient had a PCN reaction that required hospitalization No Has patient had a PCN reaction occurring within the last 10 years: No If all of the above answers are "NO", then may proceed with Cephalosporin use.  . Prednisone Palpitations and Other (See Comments)    Patient states this medications made her hallucinate.    Family History  Problem Relation Age of Onset  . Multiple sclerosis Mother   . Coronary artery disease Father   . Heart disease Brother   . Breast cancer Paternal Aunt     Prior to Admission medications   Medication Sig Start Date End Date Taking? Authorizing Provider  albuterol (PROVENTIL HFA;VENTOLIN HFA) 108 (90 BASE) MCG/ACT inhaler Inhale 2 puffs into the lungs every 6 (six) hours as needed for wheezing or shortness of breath. WITH A SPACER 03/09/13   [name], MD  beclomethasone (QVAR) 40 MCG/ACT inhaler Inhale 2 puffs into the lungs 2 (two) times daily. 04/15/15   Norval Gable, MD  calcium-vitamin D (OSCAL WITH D) 500-200 MG-UNIT per tablet Take 1 tablet by mouth daily with breakfast. 07/14/13   [name], MD  doxycycline (VIBRA-TABS) 100 MG tablet Take 1 tablet (100 mg total) by mouth 2 (two) times daily. 04/15/15   Norval Gable, MD  ergocalciferol (DRISDOL) 50000 UNITS capsule Take 1 capsule (50,000 Units total) by mouth once a week. 03/18/15   [name], MD  hydrochlorothiazide (  HYDRODIURIL) 25 MG tablet TAKE 1 TABLET(25 MG) BY MOUTH DAILY 04/30/15   [name], MD  levocetirizine (XYZAL) 5 MG tablet Take 1 tablet (5 mg total) by mouth every evening. 07/07/14   [name], MD  levothyroxine (SYNTHROID, LEVOTHROID) 112 MCG tablet TAKE 1 TABLET BY MOUTH EVERY DAY 09/26/14   [name], MD  loratadine-pseudoephedrine (CLARITIN-D 24-HOUR) 10-240 MG 24 hr tablet Take 1  tablet by mouth daily.    Historical Provider, MD  losartan (COZAAR) 100 MG tablet Take 1 tablet (100 mg total) by mouth daily. 05/28/15   [name], MD  sulfamethoxazole-trimethoprim (BACTRIM DS,SEPTRA DS) 800-160 MG tablet Take 1 tablet by mouth 2 (two) times daily. 04/24/15   Frederich Cha, MD  Umeclidinium-Vilanterol [facility] ELLIPTA) 62.5-25 MCG/INH AEPB Inhale 1 puff into the lungs daily.    Historical Provider, MD   Physical Exam: Filed Vitals:   06/03/15 0915 06/03/15 0930 06/03/15 1030 06/03/15 1100  BP:  143/75 130/65 147/74  Pulse: 77 109 72 116  Temp:      TempSrc:      Resp:      Height:      Weight:      SpO2: 95% 95% 94% 90%     General:  Acutely ill appearing in moderate respiratory distress, WDWN, [place]/AT  Eyes: PERRL, EOMI, no scleral icterus, conjunctiva clear  ENT: dry oropharynx without exudate, TM's benign  Neck: supple, no lymphadenopathy. No thyromegaly or bruits  Cardiovascular: regular rate without MRG; 2+ peripheral pulses, no JVD, trace peripheral edema  Respiratory: diffuse wheezing and rhonchi without rales or dullness. Respiratory effort increased  Abdomen: soft, non tender to palpation, positive bowel sounds, no guarding, no rebound, no organomegaly  Skin: no rashes or lesions  Musculoskeletal: normal bulk and tone, no joint swelling  Psychiatric: normal mood and affect, A&OX3  Neurologic: CN 2-12 grossly intact, Motor strength 5/5 in all 4 groups with normal DTR's and normal sensory exam  Labs on Admission:  Basic Metabolic Panel:  Recent Labs Lab 06/03/15 0809  NA 141  K 4.1  CL 103  CO2 29  GLUCOSE 100*  BUN 17  CREATININE 0.84  CALCIUM 8.4*   Liver Function Tests:  Recent Labs Lab 06/03/15 0809  AST 21  ALT 17  ALKPHOS 70  BILITOT 0.6  PROT 7.5  ALBUMIN 4.1   No results for input(s): LIPASE, AMYLASE in the last 168 hours. No results for input(s): AMMONIA in the last 168 hours. CBC:  Recent Labs Lab  06/03/15 0809  WBC 9.8  NEUTROABS 7.1*  HGB 12.9  HCT 39.0  MCV 89.3  PLT 154   Cardiac Enzymes:  Recent Labs Lab 06/03/15 0809  TROPONINI <0.03    BNP (last 3 results)  Recent Labs  04/06/15 0927 06/03/15 0809  BNP 38.0 66.0    ProBNP (last 3 results) No results for input(s): PROBNP in the last 8760 hours.  CBG: No results for input(s): GLUCAP in the last 168 hours.  Radiological Exams on Admission: Dg Chest 2 View  06/03/2015  CLINICAL DATA:  Pt to ED c/o wheezing since last night, denies cough, known copd. EXAM: CHEST - 2 VIEW COMPARISON:  05/20/2015 FINDINGS: Lungs are clear. Heart size and mediastinal contours are within normal limits. No effusion.  No pneumothorax. Mild spurring in the mid and lower thoracic spine. IMPRESSION: No acute cardiopulmonary disease. Electronically Signed   By: Lucrezia Europe M.D.   On: 06/03/2015 08:39  EKG: Independently reviewed.  Assessment/Plan Principal Problem:   Acute respiratory failure (HCC) Active Problems:   Pulmonary hypertension (HCC)   COPD exacerbation (HCC)   Acute bronchitis   Will admit to floor with O2, IV fluids, IV ABX, and SVN's. No steroids due to allergy. Empiric IV magnesium. Consult Pulmonology. Wean O2 as tolearted. Repeat CXR  And labs in AM.  Diet: clear liquids Fluids: NS@100  DVT Prophylaxis: SQ Heparin  Code Status: FULL  Family Communication: yes  Disposition Plan: home  Time spent: 50 min

## 2015-06-03 NOTE — ED Provider Notes (Signed)
[facility] Emergency Department Provider Note  ____________________________________________  Time seen: Approximately 8:21 AM  I have reviewed the triage vital signs and the nursing notes.   HISTORY  Chief Complaint Shortness of Breath    HPI Sarabelle Bellavance is a 60 y.o. female complains of cough shortness of breath and coughing up white phlegm since yesterday. She does not believe she is had a fever. She does think she might be having a lung infection. She has a history of COPD. He is moderately short of breath. No new medications or surgeries or anything nothing seems to make it better or worse she is wheezing the only associated symptom. Patient is using her accessory muscles to talk to me   Past Medical History  Diagnosis Date  . Congenital absence of one kidney   . History of sciatica   . Depression     treated at [place]  . Hypertension   . hypothyrodism   . COPD (chronic obstructive pulmonary disease) ([place])   . Anxiety   . Emphysema/COPD [facility])     Patient Active Problem List   Diagnosis Date Noted  . Acute respiratory failure ([place]) 06/03/2015  . Acute bronchitis 06/03/2015  . Encounter for preventive health examination 03/14/2015  . Breast cancer screening 03/14/2015  . Need for hepatitis C screening test 03/14/2015  . COPD with hypoxia ([place]) 02/16/2015  . COPD exacerbation ([place]) 02/03/2015  . Vitamin D deficiency 01/11/2015  . S/P hysterectomy with oophorectomy 09/22/2013  . Pulmonary hypertension ([place]) 07/14/2013  . Edema 01/12/2013  . Hypothyroidism 05/04/2012  . Severe mitral regurgitation by prior echocardiogram 01/11/2012  . Obesity (BMI 30-39.9) 08/04/2011  . Depression   . Hypertension   . Congenital absence of one kidney   . History of sciatica     Past Surgical History  Procedure Laterality Date  . Combined hysterectomy abdominal w/ a&p repair / oophorectomy  1996    benign tumor  . Inner ear surgery      bilateral   . Abdominal hysterectomy    . Breast biopsy Right 2011    [facility]< benign    No current outpatient prescriptions on file.  Allergies Codeine; Penicillin g; Zithromax; and Prednisone  Family History  Problem Relation Age of Onset  . Multiple sclerosis Mother   . Coronary artery disease Father   . Heart disease Brother   . Breast cancer Paternal Aunt     Social History Social History  Substance Use Topics  . Smoking status: Never Smoker   . Smokeless tobacco: Never Used  . Alcohol Use: No    Review of Systems Constitutional: No fever/chills Eyes: No visual changes. ENT: No sore throat. Cardiovascular: Denies chest pain. Respiratory: See history of present illness Gastrointestinal: No abdominal pain.  No nausea, no vomiting.  No diarrhea.  No constipation. Genitourinary: Negative for dysuria. Musculoskeletal: Negative for back pain. Skin: Negative for rash. Neurological: Negative for headaches, focal weakness or numbness.  10-point ROS otherwise negative.  ____________________________________________   PHYSICAL EXAM:  VITAL SIGNS: ED Triage Vitals  Enc Vitals Group     BP 06/03/15 0753 163/80 mmHg     Pulse Rate 06/03/15 0753 98     Resp 06/03/15 0753 26     Temp 06/03/15 0753 97.6 F (36.4 C)     Temp Source 06/03/15 0753 Oral     SpO2 06/03/15 0753 93 %     Weight 06/03/15 0753 198 lb (89.812 kg)     Height 06/03/15  0753 5\' 2"  (1.575 m)     Head Cir --      Peak Flow --      Pain Score 06/03/15 0804 0     Pain Loc --      Pain Edu? --      Excl. in [place]? --     Constitutional: Alert and oriented. Well appearing and in no acute distress. Eyes: Conjunctivae are normal. PERRL. EOMI. Head: Atraumatic. Nose: No congestion/rhinnorhea. Mouth/Throat: Mucous membranes are moist.  Oropharynx non-erythematous. Neck: No stridor.   Cardiovascular: Normal rate, regular rhythm. Grossly normal heart sounds.  Good peripheral circulation. Respiratory: Increased  respiratory effort some retractions and diffuse wheezes Gastrointestinal: Soft and nontender. No distention. No abdominal bruits. No CVA tenderness. Musculoskeletal: No lower extremity tenderness nor edema.  No joint effusions. Neurologic:  Normal speech and language. No gross focal neurologic deficits are appreciated. No gait instability. Skin:  Skin is warm, dry and intact. No rash noted. Psychiatric: Mood and affect are normal. Speech and behavior are normal.  ____________________________________________   LABS (all labs ordered are listed, but only abnormal results are displayed)  Labs Reviewed  COMPREHENSIVE METABOLIC PANEL - Abnormal; Notable for the following:    Glucose, Bld 100 (*)    Calcium 8.4 (*)    All other components within normal limits  CBC WITH DIFFERENTIAL/PLATELET - Abnormal; Notable for the following:    RDW 14.6 (*)    Neutro Abs 7.1 (*)    Eosinophils Absolute 0.8 (*)    All other components within normal limits  TROPONIN I  BRAIN NATRIURETIC PEPTIDE   ____________________________________________  EKG  EKG read and interpreted by me shows normal sinus rhythm rate of 95 normal axis no acute ST-T wave changes ____________________________________________  RADIOLOGY  Chest x-ray read by radiology as no acute disease I reviewed the film ____________________________________________   PROCEDURES  Patient sits up in bed stands up and walks 3 steps and her O2 sat falls to 73% patient is extremely short of breath  ____________________________________________   INITIAL IMPRESSION / ASSESSMENT AND PLAN / ED COURSE  Pertinent labs & imaging results that were available during my care of the patient were reviewed by me and considered in my medical decision making (see chart for details).   ____________________________________________   FINAL CLINICAL IMPRESSION(S) / ED DIAGNOSES  Final diagnoses:  COPD exacerbation ([place])      Nena Polio,  MD 06/03/15 1640

## 2015-06-03 NOTE — ED Notes (Signed)
Resumed care from [place], [place]

## 2015-06-03 NOTE — Progress Notes (Signed)
°   06/03/15 1700  Clinical Encounter Type  Visited With Patient  Visit Type Follow-up  Consult/Referral To Chaplain  Spiritual Encounters  Spiritual Needs Other (Comment)  Reviewed Advance Directives with patient.  She will notify her nurse in the morning when she is ready to have document notarized. [place] Ext [phone]

## 2015-06-03 NOTE — ED Notes (Signed)
Pt c/o shortness of breath since yesterday. Hx COPD and emphysema.  Pt feels like has lung infection.  Labored when presenting to front desk and tachypenic.

## 2015-06-03 NOTE — Progress Notes (Signed)
°   06/03/15 1600  Clinical Encounter Type  Visited With Patient  Visit Type Initial  Referral From Nurse  Consult/Referral To Chaplain  Spiritual Encounters  Spiritual Needs Other (Comment)  Consult order for Advance Directives.  Patient asked that I return at another time.  Left documents with patient to review.  Returned for follow-up, patient on phone.  Will attempt another follow-up visit. [place]  Ext [phone]

## 2015-06-03 NOTE — ED Notes (Signed)
Report to Kim, RN

## 2015-06-04 ENCOUNTER — Inpatient Hospital Stay: Payer: Medicare Other

## 2015-06-04 LAB — CBC
HCT: 37.5 % (ref 35.0–47.0)
Hemoglobin: 12.4 g/dL (ref 12.0–16.0)
MCH: 30.2 pg (ref 26.0–34.0)
MCHC: 33.1 g/dL (ref 32.0–36.0)
MCV: 91.3 fL (ref 80.0–100.0)
Platelets: 151 10*3/uL (ref 150–440)
RBC: 4.11 MIL/uL (ref 3.80–5.20)
RDW: 14.5 % (ref 11.5–14.5)
WBC: 8.3 10*3/uL (ref 3.6–11.0)

## 2015-06-04 LAB — GLUCOSE, CAPILLARY
GLUCOSE-CAPILLARY: 69 mg/dL (ref 65–99)
GLUCOSE-CAPILLARY: 92 mg/dL (ref 65–99)
Glucose-Capillary: 64 mg/dL — ABNORMAL LOW (ref 65–99)
Glucose-Capillary: 96 mg/dL (ref 65–99)

## 2015-06-04 LAB — COMPREHENSIVE METABOLIC PANEL
ALT: 15 U/L (ref 14–54)
AST: 19 U/L (ref 15–41)
Albumin: 3.7 g/dL (ref 3.5–5.0)
Alkaline Phosphatase: 66 U/L (ref 38–126)
Anion gap: 7 (ref 5–15)
BUN: 12 mg/dL (ref 6–20)
CO2: 30 mmol/L (ref 22–32)
Calcium: 8.3 mg/dL — ABNORMAL LOW (ref 8.9–10.3)
Chloride: 102 mmol/L (ref 101–111)
Creatinine, Ser: 0.85 mg/dL (ref 0.44–1.00)
GFR calc Af Amer: >60 mL/min (ref >60)
GFR calc non Af Amer: >60 mL/min (ref >60)
Glucose, Bld: 83 mg/dL (ref 65–99)
Potassium: 4.2 mmol/L (ref 3.5–5.1)
Sodium: 139 mmol/L (ref 135–145)
Total Bilirubin: 1 mg/dL (ref 0.3–1.2)
Total Protein: 6.9 g/dL (ref 6.5–8.1)

## 2015-06-04 MED ORDER — LEVOFLOXACIN 500 MG PO TABS
500.0000 mg | ORAL_TABLET | Freq: Every day | ORAL | Status: DC
  Administered 2015-06-04 – 2015-06-05 (×2): 500 mg via ORAL
  Filled 2015-06-04 (×2): qty 1

## 2015-06-04 MED ORDER — ENOXAPARIN SODIUM 40 MG/0.4ML ~~LOC~~ SOLN
40.0000 mg | SUBCUTANEOUS | Status: DC
  Administered 2015-06-04: 40 mg via SUBCUTANEOUS
  Filled 2015-06-04: qty 0.4

## 2015-06-04 MED ORDER — GUAIFENESIN-DM 100-10 MG/5ML PO SYRP
10.0000 mL | ORAL_SOLUTION | Freq: Four times a day (QID) | ORAL | Status: DC | PRN
  Administered 2015-06-04 (×2): 10 mL via ORAL
  Filled 2015-06-04 (×2): qty 10

## 2015-06-04 NOTE — Progress Notes (Signed)
Date: 06/04/2015,   MRN# [id number] Lauren Bartlett 16-Oct-1955 Code Status:     Code Status Orders        Start     Ordered   06/03/15 1343  Full code   Continuous     06/03/15 1342    Code Status History    Date Active Date Inactive Code Status Order ID Comments User Context   02/16/2015  9:36 AM 02/18/2015  4:33 PM Full Code [id number]  Fritzi Mandes, MD Inpatient   02/03/2015  2:32 AM 02/05/2015  4:34 PM Full Code [id number]  Lytle Butte, MD ED     Hosp day:@LENGTHOFSTAYDAYS @ Referring MD: @ATDPROV @         AdmissionWeight: 198 lb (89.812 kg)                 CurrentWeight: 194 lb 4.8 oz (88.134 kg)    CC: Resp failure  HPI: This is a 60 yr old lady, who comes with Copd/asthama flare and anxiety masking severity. Her sats on admission was 73 % with exertion. Today she is better. She denies chest pain, leg pain, edema, ectopy, syncope, less wheezing and less sob.  At [facility] was seing her for the following:  1. Copd, due to second hand smoke, stable 2. Suspect pulmonary hypertension. Question is the later idiopathic versus due to secondary causes (eg severe MR).  3. Obesity is contributing also to her shortness of breath.  4. Rhinitis 5. There is also nocturnal oxygen desaturation, more energy since starting noct 02. S/P sleep study. AHI 5.8  PMHX:   Past Medical History  Diagnosis Date  . Congenital absence of one kidney   . History of sciatica   . Depression     treated at [place]  . Hypertension   . hypothyrodism   . COPD (chronic obstructive pulmonary disease) ([place])   . Anxiety   . Emphysema/COPD [facility])    Surgical Hx:  Past Surgical History  Procedure Laterality Date  . Combined hysterectomy abdominal w/ a&p repair / oophorectomy  1996    benign tumor  . Inner ear surgery      bilateral  . Abdominal hysterectomy    . Breast biopsy Right 2011    [facility]< benign   Family Hx:  Family History  Problem Relation Age of Onset  . Multiple sclerosis Mother   . Coronary  artery disease Father   . Heart disease Brother   . Breast cancer Paternal Aunt    Social Hx:   Social History  Substance Use Topics  . Smoking status: Never Smoker   . Smokeless tobacco: Never Used  . Alcohol Use: No   Medication:    Home Medication:  No current outpatient prescriptions on file.  Current Medication: @CURMEDTAB @   Allergies:  Codeine; Penicillin g; Zithromax; and Prednisone  Review of Systems: Gen:  Denies  fever, sweats, chills HEENT: Denies blurred vision, double vision, ear pain, eye pain, hearing loss, nose bleeds, sore throat Cvc:  No dizziness, chest pain or heaviness Resp: less cough, wheezing or sob   Gi: Denies swallowing difficulty, stomach pain, nausea or vomiting, diarrhea, constipation, bowel incontinence Gu:  Denies bladder incontinence, burning urine Ext:   No Joint pain, stiffness or swelling Skin: No skin rash, easy bruising or bleeding or hives Endoc:  No polyuria, polydipsia , polyphagia or weight change Psych: No depression, insomnia or hallucinations  Other:  All other systems negative  Physical Examination:   VS: BP 104/59 mmHg  Pulse  91  Temp(Src) 98.3 F (36.8 C) (Oral)  Resp 18  Ht 5\' 2"  (1.575 m)  Wt 194 lb 4.8 oz (88.134 kg)  BMI 35.53 kg/m2  SpO2 93%  General Appearance: No distress, Sharp 02 at 2 liters,   Neuro/psych: without focal findings, mental status, speech normal, alert and oriented, cranial nerves 2-12 intact, reflexes normal and symmetric, sensation grossly normal  HEENT: PERRLA, EOM intact, no ptosis, no other lesions noticed Pulmonary:.No wheezing, No rales   Cardiovascular:  Normal S1,S2.  No m/r/g.  .    Abdomen:Benign, Soft, non-tender, No masses, hepatosplenomegaly, No lymphadenopathy Endoc: No evident thyromegaly, no signs of acromegaly or Cushing features Skin:   warm, no rashes, no ecchymosis  Extremities: normal, no cyanosis, clubbing, no edema, warm with normal capillary refill. Other findings:    Labs results:   Recent Labs     06/03/15  0809  06/04/15  0442  HGB  12.9  12.4  HCT  39.0  37.5  MCV  89.3  91.3  WBC  9.8  8.3  BUN  17  12  CREATININE  0.84  0.85  GLUCOSE  100*  83  CALCIUM  8.4*  8.3*  ,      Rad results:   X-ray Chest Pa And Lateral  06/04/2015  CLINICAL DATA:  Shortness of breath.  COPD, asthma, hypertension. EXAM: CHEST  2 VIEW COMPARISON:  06/03/2015 FINDINGS: The heart size and mediastinal contours are within normal limits. Both lungs are clear. The visualized skeletal structures are unremarkable. IMPRESSION: No active cardiopulmonary disease. Electronically Signed   By: Rolm Baptise M.D.   On: 06/04/2015 08:17      Assessment and Plan: Copd/asthama came in with a flare and anxiety masking severity. Her sats on admission was 73 % with exertion. Presently improved. She also has elevated rvsp on echo ? Pulmonary hypertension. Hx oh MR -continue as is -r & e o2 sat on ra prior to d/c -out patient f/u with pulmonary in 1-2 weeks -out out patient continued evaluation for ? pulmonary hypertension -folowing  I have personally obtained a history, examined the patient, evaluated laboratory and imaging results, formulated the assessment and plan and placed orders.  The Patient requires high complexity decision making for assessment and support, frequent evaluation and titration of therapies, application of advanced monitoring technologies and extensive interpretation of multiple databases.   Osher Oettinger,M.D. Pulmonary & Critical care Medicine [facility]

## 2015-06-04 NOTE — Plan of Care (Signed)
Problem: Discharge Progression Outcomes Goal: Tolerating diet Outcome: Progressing Nausea x1 with zofran this shift Goal: Activity appropriate for discharge plan Outcome: Not Progressing Pt states not sure how she will manage at home

## 2015-06-04 NOTE — Evaluation (Signed)
Physical Therapy Evaluation Patient Details Name: Lauren Bartlett MRN: [id number] DOB: Jul 22, 1955 Today's Date: 06/04/2015   History of Present Illness  Pt admitted for ARF. Pt with history of asthma and HTN. Pt with complaints of SOB with cough x 2 days. Pt with chronic 2L of O2 at night  Clinical Impression  Pt is a pleasant 60 year old female who was admitted for ARF. Pt demonstrates all bed mobility/transfers/ambulation at baseline level. All mobility performed on room air with sats only dropping to 90% with exertion per PT pulse ox. Per RN, sats dropped on exertion to 78% on continuous pulse ox. Pt with no SOB symptoms at this time and sats increased to 95% with rest. Pt requested to be taken off O2 at this time. RN aware. Pt does not require any further PT needs at this time. Pt will be dc in house and does not require follow up. RN aware. Pt may benefit from ALF for further supervision at discharge as pt expresses concerns with living alone. Will dc current orders.       Follow Up Recommendations No PT follow up    Equipment Recommendations   (pt would like new rollater and portable O2 tank)    Recommendations for Other Services       Precautions / Restrictions Precautions Precautions: Fall Restrictions Weight Bearing Restrictions: No      Mobility  Bed Mobility Overal bed mobility: Independent             General bed mobility comments: safe technique performed.   Transfers Overall transfer level: Modified independent Equipment used: 4-wheeled walker             General transfer comment: cues to lock brakes prior to standing. Once standing, pt able to demonstrate upright posture. No LOB noted  Ambulation/Gait Ambulation/Gait assistance: Supervision Ambulation Distance (Feet): 210 Feet Assistive device: 4-wheeled walker Gait Pattern/deviations: Step-through pattern     General Gait Details: ambulated using rollater with safe technique and reciprocal gait  pattern. Pt fatigues with increased ambulation, however no complaints of SOB  Stairs            Wheelchair Mobility    Modified Rankin (Stroke Patients Only)       Balance Overall balance assessment: Needs assistance Sitting-balance support: Feet supported Sitting balance-Leahy Scale: Normal     Standing balance support: Bilateral upper extremity supported Standing balance-Leahy Scale: Normal                               Pertinent Vitals/Pain Pain Assessment: No/denies pain    Home Living Family/patient expects to be discharged to:: Private residence Living Arrangements: Alone   Type of Home: Apartment Home Access: Level entry     Home Layout: One level Home Equipment: Walker - 4 wheels;Cane - single point      Prior Function Level of Independence: Independent with assistive device(s)         Comments: Pt reports no falls with either SPC or 4WW     Hand Dominance        Extremity/Trunk Assessment   Upper Extremity Assessment: Overall WFL for tasks assessed           Lower Extremity Assessment: Overall WFL for tasks assessed         Communication   Communication: No difficulties  Cognition Arousal/Alertness: Awake/alert Behavior During Therapy: Flat affect Overall Cognitive Status: Within Functional Limits for tasks assessed  General Comments      Exercises        Assessment/Plan    PT Assessment Patent does not need any further PT services  PT Diagnosis Difficulty walking   PT Problem List    PT Treatment Interventions     PT Goals (Current goals can be found in the Care Plan section) Acute Rehab PT Goals Patient Stated Goal: to go home PT Goal Formulation: With patient Time For Goal Achievement: 06/20/2015 Potential to Achieve Goals: Good    Frequency     Barriers to discharge        Co-evaluation               End of Session Equipment Utilized During Treatment: Gait  belt Activity Tolerance: Patient tolerated treatment well Patient left: in bed;with bed alarm set Nurse Communication: Mobility status         Time: [id number] PT Time Calculation (min) (ACUTE ONLY): 15 min   Charges:   PT Evaluation $PT Eval Low Complexity: 1 Procedure     PT G Codes:        Edras Wilford 06/20/15, 4:18 PM Greggory Stallion, PT, DPT [phone]

## 2015-06-04 NOTE — Care Management (Addendum)
Admitted to [facility] with the diagnosis of acute respiratory failure. Lives alone at [facility] in [place] x 5 years. Father is Guilianna Basgall [phone]). Night oxygen x 1 year. No skilled facility.  Uses a rollator to aid in ambulation. Life Alert in the home. Takes care of all basic and instrumental activities of daily living herself, drives. Next scheduled appointment with Dr Derrel Nip is March 20th. States she is afraid to be at home alone at night due to breathing difficulty. Discussed assisted Living facilities. States she has been thinking about going to an Assisted Living facility.  Gave list of Assisted Living Facilities to Ms. Rule. States she would like to think about it. No falls. Good appetite Oxygen challenge test to determine if continuous oxygen is needed will be completed today. Will request social worker in the home. Shelbie Ammons RN MSN CCM Care Management [phone]

## 2015-06-04 NOTE — Progress Notes (Signed)
Pt CBg was 64 this morning, pt given two orange juices.  CBG re-check was 96.  Clarise Cruz, RN

## 2015-06-04 NOTE — Progress Notes (Signed)
Patient ID: Lauren Bartlett, female   DOB: [birth year], 60 y.o.   MRN: [id number] [facility] [place] NAME: Lauren Bartlett    MR#:  [id number]  DATE OF BIRTH:  [birth year]  SUBJECTIVE:   Came in with cough congestion and shortness of breath since the lateral. REVIEW OF SYSTEMS:   Review of Systems  Constitutional: Negative for fever, chills and weight loss.  HENT: Negative for ear discharge, ear pain and nosebleeds.   Eyes: Negative for blurred vision, pain and discharge.  Respiratory: Positive for cough, sputum production and shortness of breath. Negative for wheezing and stridor.   Cardiovascular: Negative for chest pain, palpitations, orthopnea and PND.  Gastrointestinal: Negative for nausea, vomiting, abdominal pain and diarrhea.  Genitourinary: Negative for urgency and frequency.  Musculoskeletal: Negative for back pain and joint pain.  Neurological: Positive for weakness. Negative for sensory change, speech change and focal weakness.  Psychiatric/Behavioral: Negative for depression and hallucinations. The patient is not nervous/anxious.   All other systems reviewed and are negative.  Tolerating Diet:yes Tolerating PT: pending  DRUG ALLERGIES:   Allergies  Allergen Reactions  . Codeine Hives and Nausea And Vomiting  . Penicillin G Itching and Swelling    Has patient had a PCN reaction causing immediate rash, facial/tongue/throat swelling, SOB or lightheadedness with hypotension: Yes Has patient had a PCN reaction causing severe rash involving mucus membranes or skin necrosis: No Has patient had a PCN reaction that required hospitalization No Has patient had a PCN reaction occurring within the last 10 years: No If all of the above answers are "NO", then may proceed with Cephalosporin use.  . Zithromax [Azithromycin] Other (See Comments)    z pack causes hallucinations per pt   . Prednisone Palpitations and Other (See Comments)     Patient states this medications made her hallucinate.    VITALS:  Blood pressure 112/60, pulse 77, temperature 97.5 F (36.4 C), temperature source Oral, resp. rate 18, height 5\' 2"  (1.575 m), weight 88.134 kg (194 lb 4.8 oz), SpO2 97 %.  PHYSICAL EXAMINATION:   Physical Exam  GENERAL:  60 y.o.-year-old patient lying in the bed with no acute distress.  EYES: Pupils equal, round, reactive to light and accommodation. No scleral icterus. Extraocular muscles intact.  HEENT: Head atraumatic, normocephalic. Oropharynx and nasopharynx clear.  NECK:  Supple, no jugular venous distention. No thyroid enlargement, no tenderness.  LUNGS: Normal breath sounds bilaterally, no wheezing, rales, rhonchi. No use of accessory muscles of respiration.  CARDIOVASCULAR: S1, S2 normal. No murmurs, rubs, or gallops.  ABDOMEN: Soft, nontender, nondistended. Bowel sounds present. No organomegaly or mass.  EXTREMITIES: No cyanosis, clubbing or edema b/l.    NEUROLOGIC: Cranial nerves II through XII are intact. No focal Motor or sensory deficits b/l.   PSYCHIATRIC:  patient is alert and oriented x 3.  SKIN: No obvious rash, lesion, or ulcer.   LABORATORY PANEL:  CBC  Recent Labs Lab 06/04/15 0442  WBC 8.3  HGB 12.4  HCT 37.5  PLT 151    Chemistries   Recent Labs Lab 06/04/15 0442  NA 139  K 4.2  CL 102  CO2 30  GLUCOSE 83  BUN 12  CREATININE 0.85  CALCIUM 8.3*  AST 19  ALT 15  ALKPHOS 66  BILITOT 1.0   Cardiac Enzymes  Recent Labs Lab 06/03/15 0809  TROPONINI <0.03   RADIOLOGY:  X-ray Chest Pa And Lateral  06/04/2015  CLINICAL DATA:  Shortness of breath.  COPD, asthma, hypertension. EXAM: CHEST  2 VIEW COMPARISON:  06/03/2015 FINDINGS: The heart size and mediastinal contours are within normal limits. Both lungs are clear. The visualized skeletal structures are unremarkable. IMPRESSION: No active cardiopulmonary disease. Electronically Signed   By: Rolm Baptise M.D.   On: 06/04/2015  08:17   Dg Chest 2 View  06/03/2015  CLINICAL DATA:  Pt to ED c/o wheezing since last night, denies cough, known copd. EXAM: CHEST - 2 VIEW COMPARISON:  05/20/2015 FINDINGS: Lungs are clear. Heart size and mediastinal contours are within normal limits. No effusion.  No pneumothorax. Mild spurring in the mid and lower thoracic spine. IMPRESSION: No acute cardiopulmonary disease. Electronically Signed   By: Lucrezia Europe M.D.   On: 06/03/2015 08:39   ASSESSMENT AND PLAN:   Lauren Bartlett is a 60 y.o. female has a past medical history significant for COPD/asthma, HTN, and anxiety now with 2 day hx of worsening SOB and cough. No fever. Denies CP. No N/V/D. Allergic to steroids. Presents to ER where she is in acute respiratory distress with tachypnea and hypoxia. Unable to speak in full sentences. Sat on RA=73% with exertion  1.. Acute on chronic shortness of breath secondary to acute on chronic respiratory failure hypoxic - patient has chronic home oxygen -IV steroids inhalers, nebulizers  2. Acute bronchitis -By mouth antibiotics - White ount normal  3.Hypertension continue home meds  4. Hypothyroidism on Synthroid Case discussed with Care Management/Social Worker. Management plans discussed with the patient, family and they are in agreement.  CODE STATUS: full  DVT Prophylaxis: Lovenox  TOTAL TIME TAKING CARE OF THIS PATIENT: 35 minutes.  >50% time spent on counselling and coordination of care  POSSIBLE D/C IN orning DEPENDING ON CLINICAL CONDITION.  Note: This dictation was prepared with Dragon dictation along with smaller phrase technology. Any transcriptional errors that result from this process are unintentional.  Ferrah Panagopoulos M.D on 06/04/2015 at 2:28 PM  Between 7am to 6pm - Pager - [phone]  After 6pm go to [email or web address] - password [email or web address] [facility]  Office  [phone]  CC: Primary care physician; [name], MD

## 2015-06-05 ENCOUNTER — Telehealth: Payer: Self-pay | Admitting: Internal Medicine

## 2015-06-05 MED ORDER — MONTELUKAST SODIUM 10 MG PO TABS: 10.0000 mg | ORAL_TABLET | Freq: Every day | ORAL | Status: DC

## 2015-06-05 MED ORDER — LEVOFLOXACIN 500 MG PO TABS: 500.0000 mg | ORAL_TABLET | Freq: Every day | ORAL | Status: DC

## 2015-06-05 MED ORDER — GUAIFENESIN ER 600 MG PO TB12: 600.0000 mg | ORAL_TABLET | Freq: Two times a day (BID) | ORAL | Status: DC

## 2015-06-05 NOTE — Care Management Important Message (Signed)
Important Message  Patient Details  Name: Lauren Bartlett MRN: [id number] Date of Birth: Mar 26, 1956   Medicare Important Message Given:  Yes    Shelbie Ammons, RN 06/05/2015, 8:31 AM

## 2015-06-05 NOTE — Discharge Summary (Signed)
[facility] [place] NAME: Lauren Bartlett    MR#:  [id number]  DATE OF BIRTH:  1955/05/05  DATE OF ADMISSION:  06/03/2015 ADMITTING PHYSICIAN: Idelle Crouch, MD  DATE OF DISCHARGE: 06/05/15  PRIMARY CARE PHYSICIAN: [name], MD    ADMISSION DIAGNOSIS:  COPD exacerbation ([place]) [J44.1]  DISCHARGE DIAGNOSIS:  COPD flare Acute bronchitis Hypoxia-now on oxygen  SECONDARY DIAGNOSIS:   Past Medical History  Diagnosis Date  . Congenital absence of one kidney   . History of sciatica   . Depression     treated at [place]  . Hypertension   . hypothyrodism   . COPD (chronic obstructive pulmonary disease) ([place])   . Anxiety   . Emphysema/COPD [facility])     HOSPITAL COURSE:   Lauren Bartlett is a 60 y.o. female has a past medical history significant for COPD/asthma, HTN, and anxiety now with 2 day hx of worsening SOB and cough. No fever. Denies CP. No N/V/D. Allergic to steroids. Presents to ER where she is in acute respiratory distress with tachypnea and hypoxia. Unable to speak in full sentences. Sat on RA=73% with exertion  1.. Acute on chronic shortness of breath secondary to acute on chronic respiratory failure hypoxic - patient has chronic home oxygen( for nite) - inhalers, nebulizers. No wheezing-no need for steroids -sats dropped to 78% on RA with exertion with PT, recovered to 95% with oxygen. Pt will need oxygen 2literNC during daytime,continuous  2. Acute bronchitis -By mouth antibiotics - White ount normal  3.Hypertension continue home meds  4. Hypothyroidism on Synthroid  Overall stable. D/c home CONSULTS OBTAINED:  Treatment Team:  Erby Pian, MD  DRUG ALLERGIES:   Allergies  Allergen Reactions  . Codeine Hives and Nausea And Vomiting  . Penicillin G Itching and Swelling    Has patient had a PCN reaction causing immediate rash, facial/tongue/throat swelling, SOB or lightheadedness with  hypotension: Yes Has patient had a PCN reaction causing severe rash involving mucus membranes or skin necrosis: No Has patient had a PCN reaction that required hospitalization No Has patient had a PCN reaction occurring within the last 10 years: No If all of the above answers are "NO", then may proceed with Cephalosporin use.  . Zithromax [Azithromycin] Other (See Comments)    z pack causes hallucinations per pt   . Prednisone Palpitations and Other (See Comments)    Patient states this medications made her hallucinate.    DISCHARGE MEDICATIONS:   Current Discharge Medication List    START taking these medications   Details  guaiFENesin (MUCINEX) 600 MG 12 hr tablet Take 1 tablet (600 mg total) by mouth 2 (two) times daily. Qty: 30 tablet, Refills: 0    levofloxacin (LEVAQUIN) 500 MG tablet Take 1 tablet (500 mg total) by mouth daily. Qty: 4 tablet, Refills: 0    montelukast (SINGULAIR) 10 MG tablet Take 1 tablet (10 mg total) by mouth at bedtime. Qty: 30 tablet, Refills: 3      CONTINUE these medications which have NOT CHANGED   Details  albuterol (PROVENTIL HFA;VENTOLIN HFA) 108 (90 BASE) MCG/ACT inhaler Inhale 2 puffs into the lungs every 6 (six) hours as needed for wheezing or shortness of breath. WITH A SPACER Qty: 1 Inhaler, Refills: 0    calcium-vitamin D (OSCAL WITH D) 500-200 MG-UNIT per tablet Take 1 tablet by mouth daily with breakfast. Qty: 90 tablet, Refills: 2    ergocalciferol (DRISDOL) 50000  UNITS capsule Take 1 capsule (50,000 Units total) by mouth once a week. Qty: 12 capsule, Refills: 0    hydrochlorothiazide (HYDRODIURIL) 25 MG tablet TAKE 1 TABLET(25 MG) BY MOUTH DAILY Qty: 30 tablet, Refills: 4    levothyroxine (SYNTHROID, LEVOTHROID) 112 MCG tablet TAKE 1 TABLET BY MOUTH EVERY DAY Qty: 30 tablet, Refills: 9    loratadine-pseudoephedrine (CLARITIN-D 24-HOUR) 10-240 MG 24 hr tablet Take 1 tablet by mouth daily.    losartan (COZAAR) 100 MG tablet Take  1 tablet (100 mg total) by mouth daily. Qty: 30 tablet, Refills: 3    Umeclidinium-Vilanterol (ANORO ELLIPTA) 62.5-25 MCG/INH AEPB Inhale 1 puff into the lungs daily.    beclomethasone (QVAR) 40 MCG/ACT inhaler Inhale 2 puffs into the lungs 2 (two) times daily. Qty: 1 Inhaler, Refills: 0    levocetirizine (XYZAL) 5 MG tablet Take 1 tablet (5 mg total) by mouth every evening. Qty: 30 tablet, Refills: 5      STOP taking these medications     doxycycline (VIBRA-TABS) 100 MG tablet      sulfamethoxazole-trimethoprim (BACTRIM DS,SEPTRA DS) 800-160 MG tablet         If you experience worsening of your admission symptoms, develop shortness of breath, life threatening emergency, suicidal or homicidal thoughts you must seek medical attention immediately by calling 911 or calling your MD immediately  if symptoms less severe.  You Must read complete instructions/literature along with all the possible adverse reactions/side effects for all the Medicines you take and that have been prescribed to you. Take any new Medicines after you have completely understood and accept all the possible adverse reactions/side effects.   Please note  You were cared for by a hospitalist during your hospital stay. If you have any questions about your discharge medications or the care you received while you were in the hospital after you are discharged, you can call the unit and asked to speak with the hospitalist on call if the hospitalist that took care of you is not available. Once you are discharged, your primary care physician will handle any further medical issues. Please note that NO REFILLS for any discharge medications will be authorized once you are discharged, as it is imperative that you return to your primary care physician (or establish a relationship with a primary care physician if you do not have one) for your aftercare needs so that they can reassess your need for medications and monitor your lab  values. Today   SUBJECTIVE  Feels better. Had BM   VITAL SIGNS:  Blood pressure 123/67, pulse 84, temperature 98.1 F (36.7 C), temperature source Oral, resp. rate 18, height 5\' 2"  (1.575 m), weight 87.998 kg (194 lb), SpO2 100 %.  I/O:   Intake/Output Summary (Last 24 hours) at 06/05/15 0724 Last data filed at 06/04/15 1800  Gross per 24 hour  Intake   1240 ml  Output    625 ml  Net    615 ml    PHYSICAL EXAMINATION:  GENERAL:  60 y.o.-year-old patient lying in the bed with no acute distress.  EYES: Pupils equal, round, reactive to light and accommodation. No scleral icterus. Extraocular muscles intact.  HEENT: Head atraumatic, normocephalic. Oropharynx and nasopharynx clear.  NECK:  Supple, no jugular venous distention. No thyroid enlargement, no tenderness.  LUNGS: Normal breath sounds bilaterally, no wheezing, rales,rhonchi or crepitation. No use of accessory muscles of respiration.  CARDIOVASCULAR: S1, S2 normal. No murmurs, rubs, or gallops.  ABDOMEN: Soft, non-tender, non-distended. Bowel sounds  present. No organomegaly or mass.  EXTREMITIES: No pedal edema, cyanosis, or clubbing.  NEUROLOGIC: Cranial nerves II through XII are intact. Muscle strength 5/5 in all extremities. Sensation intact. Gait not checked.  PSYCHIATRIC: The patient is alert and oriented x 3.  SKIN: No obvious rash, lesion, or ulcer.   DATA REVIEW:   CBC   Recent Labs Lab 06/04/15 0442  WBC 8.3  HGB 12.4  HCT 37.5  PLT 151    Chemistries   Recent Labs Lab 06/04/15 0442  NA 139  K 4.2  CL 102  CO2 30  GLUCOSE 83  BUN 12  CREATININE 0.85  CALCIUM 8.3*  AST 19  ALT 15  ALKPHOS 66  BILITOT 1.0    Microbiology Results   No results found for this or any previous visit (from the past 240 hour(s)).  RADIOLOGY:  X-ray Chest Pa And Lateral  06/04/2015  CLINICAL DATA:  Shortness of breath.  COPD, asthma, hypertension. EXAM: CHEST  2 VIEW COMPARISON:  06/03/2015 FINDINGS: The heart  size and mediastinal contours are within normal limits. Both lungs are clear. The visualized skeletal structures are unremarkable. IMPRESSION: No active cardiopulmonary disease. Electronically Signed   By: Rolm Baptise M.D.   On: 06/04/2015 08:17   Dg Chest 2 View  06/03/2015  CLINICAL DATA:  Pt to ED c/o wheezing since last night, denies cough, known copd. EXAM: CHEST - 2 VIEW COMPARISON:  05/20/2015 FINDINGS: Lungs are clear. Heart size and mediastinal contours are within normal limits. No effusion.  No pneumothorax. Mild spurring in the mid and lower thoracic spine. IMPRESSION: No acute cardiopulmonary disease. Electronically Signed   By: Lucrezia Europe M.D.   On: 06/03/2015 08:39     Management plans discussed with the patient, family and they are in agreement.  CODE STATUS:     Code Status Orders        Start     Ordered   06/03/15 1343  Full code   Continuous     06/03/15 1342    Code Status History    Date Active Date Inactive Code Status Order ID Comments User Context   02/16/2015  9:36 AM 02/18/2015  4:33 PM Full Code [id number]  Fritzi Mandes, MD Inpatient   02/03/2015  2:32 AM 02/05/2015  4:34 PM Full Code [id number]  Lytle Butte, MD ED      TOTAL TIME TAKING CARE OF THIS PATIENT: 40 minutes.    Bill Mcvey M.D on 06/05/2015 at 7:24 AM  Between 7am to 6pm - Pager - [phone] After 6pm go to [email or web address] - password [email or web address] [facility]  Office  [phone]  CC: Primary care physician; [name], MD

## 2015-06-05 NOTE — Telephone Encounter (Signed)
We can use the 06/18/15 if iti s a 30 minute appointment.

## 2015-06-05 NOTE — Telephone Encounter (Signed)
Transition Care Management Follow-up Telephone Call  How have you been since you were released from the hospital?doing okay, had to be on Oxygen a lot more   Do you understand why you were in the hospital? Yes   Do you understand the discharge instrcutions? yes   Items Reviewed:  Medications reviewed: yes, added an antibiotic levaquin and mucinex tablets  Allergies reviewed: yes, no changes  Dietary changes reviewed:  Yes no changes Referrals reviewed: Dr. Raul Del on the 23rd (pulmonary) to get a portable oxygen tank Functional Questionnaire:   Activities of Daily Living (ADLs):   She states they are independent in the following: difficult, but able to do most her self States they require assistance with the following: walker, needs a lighterweight   Any transportation issues/concerns?: none right now   Any patient concerns?  Assisted living??Patient wants Dr. Lupita Dawn opinion.     Confirmed importance and date/time of follow-up visits scheduled: follow up on 06/18/15 at 1015am   Confirmed with patient if condition begins to worsen call PCP or go to the ER.  Patient was given the Call-a-Nurse line [phone]:

## 2015-06-05 NOTE — Progress Notes (Signed)
SATURATION QUALIFICATIONS: (This note is used to comply with regulatory documentation for home oxygen)  Patient Saturations on Room Air at Rest = 94%  Patient Saturations on Room Air while Ambulating = 73%  Patient Saturations on 2 Liters of oxygen while Ambulating = 95%

## 2015-06-05 NOTE — Telephone Encounter (Signed)
HFU/ Pt is being discharged today. Diagnosis is COPD. Pt is scheduled for 06/22/2015. However pt does have a scheduled appt already for 06/18/2015. Im not sure what appt you want pt to keep. Thank you!

## 2015-06-05 NOTE — Telephone Encounter (Signed)
Ok. Pt was called to come in on 3/20 I left a vm. I'll also call on 3/19 for a call reminder.

## 2015-06-05 NOTE — Progress Notes (Signed)
Discussed discharge instructions and medications.  All questions answered.  IV removed.  Made pt aware of follow up appointment and that rx have been sent to [facility] in [place].  Pt transported home via car by family.  Clarise Cruz, RN

## 2015-06-13 ENCOUNTER — Ambulatory Visit: Payer: Medicare Other | Admitting: Internal Medicine

## 2015-06-18 ENCOUNTER — Ambulatory Visit: Payer: Medicare Other | Admitting: Internal Medicine

## 2015-06-22 ENCOUNTER — Ambulatory Visit: Payer: Medicare Other | Admitting: Internal Medicine

## 2015-06-28 ENCOUNTER — Other Ambulatory Visit: Payer: Self-pay | Admitting: Internal Medicine

## 2015-07-06 ENCOUNTER — Ambulatory Visit: Payer: Medicare Other | Admitting: Internal Medicine

## 2015-07-26 ENCOUNTER — Telehealth: Payer: Self-pay | Admitting: Internal Medicine

## 2015-07-26 NOTE — Telephone Encounter (Signed)
Spoke with the patient, a refill was sent in on March 30th for 90day supply with 3 refills.  She will check with the pharmacy.

## 2015-07-26 NOTE — Telephone Encounter (Signed)
Pt called in about needing a refill for levothyroxine (SYNTHROID, LEVOTHROID) 112 MCG tablet. Pharmacy is [id number] - [place], [place] [address]. Call pt @ [phone]. Thank you!

## 2015-07-27 ENCOUNTER — Ambulatory Visit (INDEPENDENT_AMBULATORY_CARE_PROVIDER_SITE_OTHER)
Admission: EM | Admit: 2015-07-27 | Discharge: 2015-07-27 | Disposition: A | Discharge status: Home / Self Care | Payer: Medicare Other | Source: Home / Self Care | Attending: Family Medicine | Admitting: Family Medicine

## 2015-07-27 ENCOUNTER — Inpatient Hospital Stay
Admission: EM | Admit: 2015-07-27 | Discharge: 2015-08-01 | DRG: 190 | Disposition: A | Discharge status: Home / Self Care | Payer: Medicare Other | Attending: Internal Medicine | Admitting: Internal Medicine

## 2015-07-27 ENCOUNTER — Encounter: Payer: Self-pay | Admitting: Emergency Medicine

## 2015-07-27 ENCOUNTER — Emergency Department: Payer: Medicare Other

## 2015-07-27 ENCOUNTER — Encounter: Payer: Self-pay | Admitting: *Deleted

## 2015-07-27 DIAGNOSIS — Z9981 Dependence on supplemental oxygen: Secondary | ICD-10-CM | POA: Diagnosis not present

## 2015-07-27 DIAGNOSIS — Z79899 Other long term (current) drug therapy: Secondary | ICD-10-CM | POA: Diagnosis not present

## 2015-07-27 DIAGNOSIS — Z803 Family history of malignant neoplasm of breast: Secondary | ICD-10-CM

## 2015-07-27 DIAGNOSIS — R112 Nausea with vomiting, unspecified: Secondary | ICD-10-CM

## 2015-07-27 DIAGNOSIS — R0902 Hypoxemia: Secondary | ICD-10-CM | POA: Diagnosis not present

## 2015-07-27 DIAGNOSIS — Z820 Family history of epilepsy and other diseases of the nervous system: Secondary | ICD-10-CM | POA: Diagnosis not present

## 2015-07-27 DIAGNOSIS — K567 Ileus, unspecified: Secondary | ICD-10-CM

## 2015-07-27 DIAGNOSIS — Z905 Acquired absence of kidney: Secondary | ICD-10-CM

## 2015-07-27 DIAGNOSIS — J441 Chronic obstructive pulmonary disease with (acute) exacerbation: Secondary | ICD-10-CM

## 2015-07-27 DIAGNOSIS — Z8249 Family history of ischemic heart disease and other diseases of the circulatory system: Secondary | ICD-10-CM | POA: Diagnosis not present

## 2015-07-27 DIAGNOSIS — Z7951 Long term (current) use of inhaled steroids: Secondary | ICD-10-CM | POA: Diagnosis not present

## 2015-07-27 DIAGNOSIS — J9601 Acute respiratory failure with hypoxia: Secondary | ICD-10-CM

## 2015-07-27 DIAGNOSIS — R609 Edema, unspecified: Secondary | ICD-10-CM

## 2015-07-27 DIAGNOSIS — R600 Localized edema: Secondary | ICD-10-CM | POA: Diagnosis present

## 2015-07-27 DIAGNOSIS — Z880 Allergy status to penicillin: Secondary | ICD-10-CM

## 2015-07-27 DIAGNOSIS — R0602 Shortness of breath: Secondary | ICD-10-CM

## 2015-07-27 DIAGNOSIS — E785 Hyperlipidemia, unspecified: Secondary | ICD-10-CM | POA: Diagnosis present

## 2015-07-27 DIAGNOSIS — J9611 Chronic respiratory failure with hypoxia: Secondary | ICD-10-CM | POA: Insufficient documentation

## 2015-07-27 DIAGNOSIS — J9621 Acute and chronic respiratory failure with hypoxia: Secondary | ICD-10-CM | POA: Diagnosis present

## 2015-07-27 DIAGNOSIS — J9602 Acute respiratory failure with hypercapnia: Secondary | ICD-10-CM

## 2015-07-27 DIAGNOSIS — E039 Hypothyroidism, unspecified: Secondary | ICD-10-CM | POA: Diagnosis present

## 2015-07-27 DIAGNOSIS — R739 Hyperglycemia, unspecified: Secondary | ICD-10-CM | POA: Diagnosis present

## 2015-07-27 DIAGNOSIS — I10 Essential (primary) hypertension: Secondary | ICD-10-CM | POA: Diagnosis present

## 2015-07-27 DIAGNOSIS — D696 Thrombocytopenia, unspecified: Secondary | ICD-10-CM | POA: Diagnosis present

## 2015-07-27 LAB — CBC WITH DIFFERENTIAL/PLATELET
BASOS ABS: 0 10*3/uL (ref 0–0.1)
EOS ABS: 0.7 10*3/uL (ref 0–0.7)
HCT: 40.1 % (ref 35.0–47.0)
Hemoglobin: 13 g/dL (ref 12.0–16.0)
Lymphocytes Relative: 17 %
Lymphs Abs: 1.5 10*3/uL (ref 1.0–3.6)
MCH: 29.4 pg (ref 26.0–34.0)
MCHC: 32.5 g/dL (ref 32.0–36.0)
MCV: 90.4 fL (ref 80.0–100.0)
Monocytes Absolute: 0.8 10*3/uL (ref 0.2–0.9)
Monocytes Relative: 8 %
Neutro Abs: 6 10*3/uL (ref 1.4–6.5)
Neutrophils Relative %: 66 %
PLATELETS: 155 10*3/uL (ref 150–440)
RBC: 4.43 MIL/uL (ref 3.80–5.20)
RDW: 14.5 % (ref 11.5–14.5)
WBC: 9.1 10*3/uL (ref 3.6–11.0)

## 2015-07-27 LAB — COMPREHENSIVE METABOLIC PANEL
ALBUMIN: 4.2 g/dL (ref 3.5–5.0)
ALT: 15 U/L (ref 14–54)
AST: 20 U/L (ref 15–41)
Alkaline Phosphatase: 63 U/L (ref 38–126)
Anion gap: 8 (ref 5–15)
BUN: 17 mg/dL (ref 6–20)
CHLORIDE: 104 mmol/L (ref 101–111)
CO2: 30 mmol/L (ref 22–32)
CREATININE: 0.84 mg/dL (ref 0.44–1.00)
Calcium: 8.7 mg/dL — ABNORMAL LOW (ref 8.9–10.3)
GFR calc non Af Amer: >60 mL/min (ref >60)
Glucose, Bld: 108 mg/dL — ABNORMAL HIGH (ref 65–99)
Potassium: 3.8 mmol/L (ref 3.5–5.1)
SODIUM: 142 mmol/L (ref 135–145)
Total Bilirubin: 0.5 mg/dL (ref 0.3–1.2)
Total Protein: 7.4 g/dL (ref 6.5–8.1)

## 2015-07-27 LAB — TROPONIN I: Troponin I: 0.03 ng/mL (ref <0.031)

## 2015-07-27 MED ORDER — ALBUTEROL SULFATE (2.5 MG/3ML) 0.083% IN NEBU: 5.0000 mg | INHALATION_SOLUTION | Freq: Once | RESPIRATORY_TRACT | Status: DC

## 2015-07-27 MED ORDER — IPRATROPIUM-ALBUTEROL 0.5-2.5 (3) MG/3ML IN SOLN
3.0000 mL | Freq: Two times a day (BID) | RESPIRATORY_TRACT | Status: DC | PRN
  Administered 2015-07-27 (×2): 3 mL via RESPIRATORY_TRACT

## 2015-07-27 MED ORDER — MAGNESIUM SULFATE 2 GM/50ML IV SOLN
2.0000 g | Freq: Once | INTRAVENOUS | Status: AC
Start: 2015-07-27 — End: 2015-07-27
  Administered 2015-07-27: 2 g via INTRAVENOUS
  Filled 2015-07-27: qty 50

## 2015-07-27 MED ORDER — ACETAMINOPHEN 325 MG PO TABS: 650.0000 mg | ORAL_TABLET | Freq: Four times a day (QID) | ORAL | Status: DC | PRN

## 2015-07-27 MED ORDER — ENOXAPARIN SODIUM 40 MG/0.4ML ~~LOC~~ SOLN
40.0000 mg | SUBCUTANEOUS | Status: DC
  Administered 2015-07-27 – 2015-07-31 (×5): 40 mg via SUBCUTANEOUS
  Filled 2015-07-27 (×5): qty 0.4

## 2015-07-27 MED ORDER — HYDROCHLOROTHIAZIDE 25 MG PO TABS
25.0000 mg | ORAL_TABLET | Freq: Every day | ORAL | Status: DC
  Administered 2015-07-28 – 2015-08-01 (×5): 25 mg via ORAL
  Filled 2015-07-27 (×5): qty 1

## 2015-07-27 MED ORDER — BECLOMETHASONE DIPROPIONATE 40 MCG/ACT IN AERS: 2.0000 | INHALATION_SPRAY | Freq: Two times a day (BID) | RESPIRATORY_TRACT | Status: DC

## 2015-07-27 MED ORDER — ACETAMINOPHEN 650 MG RE SUPP
650.0000 mg | Freq: Four times a day (QID) | RECTAL | Status: DC | PRN
Start: 2015-07-27 — End: 2015-08-01

## 2015-07-27 MED ORDER — PSEUDOEPHEDRINE HCL 60 MG PO TABS
60.0000 mg | ORAL_TABLET | Freq: Four times a day (QID) | ORAL | Status: DC | PRN
  Filled 2015-07-27: qty 1

## 2015-07-27 MED ORDER — LORATADINE-PSEUDOEPHEDRINE ER 10-240 MG PO TB24: 1.0000 | ORAL_TABLET | Freq: Every day | ORAL | Status: DC

## 2015-07-27 MED ORDER — ONDANSETRON HCL 4 MG/2ML IJ SOLN
4.0000 mg | Freq: Four times a day (QID) | INTRAMUSCULAR | Status: DC | PRN
  Administered 2015-07-30 (×2): 4 mg via INTRAVENOUS
  Filled 2015-07-27 (×2): qty 2

## 2015-07-27 MED ORDER — LOSARTAN POTASSIUM 50 MG PO TABS
100.0000 mg | ORAL_TABLET | Freq: Every day | ORAL | Status: DC
  Administered 2015-07-28 – 2015-08-01 (×5): 100 mg via ORAL
  Filled 2015-07-27 (×5): qty 2

## 2015-07-27 MED ORDER — LEVOFLOXACIN 500 MG PO TABS: 500.0000 mg | ORAL_TABLET | Freq: Every day | ORAL | Status: DC

## 2015-07-27 MED ORDER — LEVOFLOXACIN 500 MG PO TABS
500.0000 mg | ORAL_TABLET | Freq: Every day | ORAL | Status: DC
  Administered 2015-07-28 – 2015-07-30 (×3): 500 mg via ORAL
  Filled 2015-07-27 (×3): qty 1

## 2015-07-27 MED ORDER — LEVOTHYROXINE SODIUM 112 MCG PO TABS
112.0000 ug | ORAL_TABLET | Freq: Every day | ORAL | Status: DC
  Administered 2015-07-28 – 2015-08-01 (×5): 112 ug via ORAL
  Filled 2015-07-27 (×5): qty 1

## 2015-07-27 MED ORDER — SODIUM CHLORIDE 0.9 % IV BOLUS (SEPSIS)
1000.0000 mL | Freq: Once | INTRAVENOUS | Status: AC
  Administered 2015-07-27: 1000 mL via INTRAVENOUS

## 2015-07-27 MED ORDER — UMECLIDINIUM-VILANTEROL 62.5-25 MCG/INH IN AEPB
1.0000 | INHALATION_SPRAY | Freq: Every day | RESPIRATORY_TRACT | Status: DC
  Administered 2015-07-28 – 2015-08-01 (×4): 1 via RESPIRATORY_TRACT
  Filled 2015-07-27: qty 14

## 2015-07-27 MED ORDER — GUAIFENESIN ER 600 MG PO TB12
600.0000 mg | ORAL_TABLET | Freq: Two times a day (BID) | ORAL | Status: DC
  Administered 2015-07-28 – 2015-07-30 (×6): 600 mg via ORAL
  Filled 2015-07-27 (×6): qty 1

## 2015-07-27 MED ORDER — LORATADINE 10 MG PO TABS
10.0000 mg | ORAL_TABLET | Freq: Every day | ORAL | Status: DC
  Administered 2015-07-28 – 2015-08-01 (×5): 10 mg via ORAL
  Filled 2015-07-27 (×5): qty 1

## 2015-07-27 MED ORDER — AZITHROMYCIN 500 MG IV SOLR: 500.0000 mg | INTRAVENOUS | Status: DC

## 2015-07-27 MED ORDER — LEVOFLOXACIN IN D5W 750 MG/150ML IV SOLN
750.0000 mg | Freq: Once | INTRAVENOUS | Status: AC
  Administered 2015-07-27: 750 mg via INTRAVENOUS
  Filled 2015-07-27: qty 150

## 2015-07-27 MED ORDER — IPRATROPIUM-ALBUTEROL 0.5-2.5 (3) MG/3ML IN SOLN
3.0000 mL | RESPIRATORY_TRACT | Status: DC
  Administered 2015-07-27 – 2015-08-01 (×27): 3 mL via RESPIRATORY_TRACT
  Filled 2015-07-27 (×29): qty 3

## 2015-07-27 MED ORDER — LEVOCETIRIZINE DIHYDROCHLORIDE 5 MG PO TABS: 5.0000 mg | ORAL_TABLET | Freq: Every evening | ORAL | Status: DC

## 2015-07-27 MED ORDER — METHYLPREDNISOLONE SODIUM SUCC 125 MG IJ SOLR
80.0000 mg | Freq: Once | INTRAMUSCULAR | Status: AC
  Administered 2015-07-27: 80 mg via INTRAVENOUS
  Filled 2015-07-27: qty 2

## 2015-07-27 MED ORDER — CALCIUM CARBONATE-VITAMIN D 500-200 MG-UNIT PO TABS
1.0000 | ORAL_TABLET | Freq: Every day | ORAL | Status: DC
  Administered 2015-07-28 – 2015-07-29 (×2): 1 via ORAL
  Filled 2015-07-27 (×4): qty 1

## 2015-07-27 MED ORDER — BUDESONIDE 0.25 MG/2ML IN SUSP
0.2500 mg | Freq: Two times a day (BID) | RESPIRATORY_TRACT | Status: DC
  Administered 2015-07-27 – 2015-08-01 (×9): 0.25 mg via RESPIRATORY_TRACT
  Filled 2015-07-27 (×10): qty 2

## 2015-07-27 MED ORDER — METHYLPREDNISOLONE SODIUM SUCC 125 MG IJ SOLR
60.0000 mg | INTRAMUSCULAR | Status: DC
  Administered 2015-07-27 – 2015-07-28 (×2): 60 mg via INTRAVENOUS
  Filled 2015-07-27 (×2): qty 2

## 2015-07-27 MED ORDER — ONDANSETRON HCL 4 MG PO TABS
4.0000 mg | ORAL_TABLET | Freq: Four times a day (QID) | ORAL | Status: DC | PRN
  Administered 2015-07-28: 4 mg via ORAL
  Filled 2015-07-27: qty 1

## 2015-07-27 MED ORDER — MONTELUKAST SODIUM 10 MG PO TABS
10.0000 mg | ORAL_TABLET | Freq: Every day | ORAL | Status: DC
  Administered 2015-07-27 – 2015-07-31 (×5): 10 mg via ORAL
  Filled 2015-07-27 (×5): qty 1

## 2015-07-27 NOTE — H&P (Signed)
Lauren Bartlett at [place] NAME: Lauren Bartlett    MR#:  [id number]  DATE OF BIRTH:  19-Jan-1956   DATE OF ADMISSION:  07/27/2015  PRIMARY CARE PHYSICIAN: [name], MD   REQUESTING/REFERRING PHYSICIAN: Joni Fears  CHIEF COMPLAINT:   Chief Complaint  Patient presents with  . Respiratory Distress    HISTORY OF PRESENT ILLNESS:  Lauren Bartlett  is a 60 y.o. female with a known history of COPD oxygen requiring nighttime only who is presenting with shortness of breath. She describes approximately 7 day duration of progressively worsening shortness of breath with associated cough productive of white to greenish sputum denies any fevers chills. Denies chest pain. Shortness of breath has continuous including exertion and rest no orthopnea, no edema no further symptomatology. States usually wears oxygen only at night for last 3 days have been wear oxygen continuously.  PAST MEDICAL HISTORY:   Past Medical History  Diagnosis Date  . Congenital absence of one kidney   . History of sciatica   . Depression     treated at [place]  . Hypertension   . hypothyrodism   . COPD (chronic obstructive pulmonary disease) ([place])   . Anxiety   . Emphysema/COPD [facility])     PAST SURGICAL HISTORY:   Past Surgical History  Procedure Laterality Date  . Combined hysterectomy abdominal w/ a&p repair / oophorectomy  1996    benign tumor  . Inner ear surgery      bilateral  . Abdominal hysterectomy    . Breast biopsy Right 2011    [facility]< benign    SOCIAL HISTORY:   Social History  Substance Use Topics  . Smoking status: Never Smoker   . Smokeless tobacco: Never Used  . Alcohol Use: No    FAMILY HISTORY:   Family History  Problem Relation Age of Onset  . Multiple sclerosis Mother   . Coronary artery disease Father   . Heart disease Brother   . Breast cancer Paternal Aunt     DRUG ALLERGIES:   Allergies  Allergen Reactions  . Codeine Hives and  Nausea And Vomiting  . Penicillin G Itching and Swelling    Has patient had a PCN reaction causing immediate rash, facial/tongue/throat swelling, SOB or lightheadedness with hypotension: Yes Has patient had a PCN reaction causing severe rash involving mucus membranes or skin necrosis: No Has patient had a PCN reaction that required hospitalization No Has patient had a PCN reaction occurring within the last 10 years: No If all of the above answers are "NO", then may proceed with Cephalosporin use.  . Zithromax [Azithromycin] Other (See Comments)    z pack causes hallucinations per pt   . Prednisone Palpitations and Other (See Comments)    Patient states this medications made her hallucinate.    REVIEW OF SYSTEMS:  REVIEW OF SYSTEMS:  CONSTITUTIONAL: Denies fevers, chills, positive fatigue, weakness.  EYES: Denies blurred vision, double vision, or eye pain.  EARS, NOSE, THROAT: Denies tinnitus, ear pain, hearing loss.  RESPIRATORY: Positive cough, shortness of breath, wheezing  CARDIOVASCULAR: Denies chest pain, palpitations, edema.  GASTROINTESTINAL: Denies nausea, vomiting, diarrhea, abdominal pain.  GENITOURINARY: Denies dysuria, hematuria.  ENDOCRINE: Denies nocturia or thyroid problems. HEMATOLOGIC AND LYMPHATIC: Denies easy bruising or bleeding.  SKIN: Denies rash or lesions.  MUSCULOSKELETAL: Denies pain in neck, back, shoulder, knees, hips, or further arthritic symptoms.  NEUROLOGIC: Denies paralysis, paresthesias.  PSYCHIATRIC: Denies anxiety or depressive symptoms. Otherwise full  review of systems performed by me is negative.   MEDICATIONS AT HOME:   Prior to Admission medications   Medication Sig Start Date End Date Taking? Authorizing Provider  albuterol (PROVENTIL HFA;VENTOLIN HFA) 108 (90 BASE) MCG/ACT inhaler Inhale 2 puffs into the lungs every 6 (six) hours as needed for wheezing or shortness of breath. WITH A SPACER 03/09/13  Yes [name], MD  beclomethasone  (QVAR) 40 MCG/ACT inhaler Inhale 2 puffs into the lungs 2 (two) times daily. 04/15/15  Yes Norval Gable, MD  calcium-vitamin D (OSCAL WITH D) 500-200 MG-UNIT per tablet Take 1 tablet by mouth daily with breakfast. 07/14/13  Yes [name], MD  hydrochlorothiazide (HYDRODIURIL) 25 MG tablet Take 25 mg by mouth daily.   Yes Historical Provider, MD  levothyroxine (SYNTHROID, LEVOTHROID) 112 MCG tablet Take 112 mcg by mouth daily before breakfast.   Yes Historical Provider, MD  loratadine-pseudoephedrine (CLARITIN-D 24-HOUR) 10-240 MG 24 hr tablet Take 1 tablet by mouth daily.   Yes Historical Provider, MD  losartan (COZAAR) 100 MG tablet Take 1 tablet (100 mg total) by mouth daily. 05/28/15  Yes [name], MD  montelukast (SINGULAIR) 10 MG tablet Take 1 tablet (10 mg total) by mouth at bedtime. 06/05/15  Yes Fritzi Mandes, MD  Umeclidinium-Vilanterol (ANORO ELLIPTA) 62.5-25 MCG/INH AEPB Inhale 1 puff into the lungs daily.   Yes Historical Provider, MD  Vitamin D, Ergocalciferol, (DRISDOL) 50000 units CAPS capsule Take 50,000 Units by mouth every 7 (seven) days. Take on Saturday   Yes Historical Provider, MD      VITAL SIGNS:  Blood pressure 136/86, pulse 102, temperature 98.2 F (36.8 C), temperature source Oral, resp. rate 20, height 5\' 2"  (1.575 m), weight 90.13 kg (198 lb 11.2 oz), SpO2 97 %.  PHYSICAL EXAMINATION:  VITAL SIGNS: Filed Vitals:   07/27/15 1700 07/27/15 1703  BP: 136/86 136/86  Pulse: 108 102  Temp:  98.2 F (36.8 C)  Resp:  20   GENERAL:59 y.o.female currently in no acute distress.  HEAD: Normocephalic, atraumatic.  EYES: Pupils equal, round, reactive to light. Extraocular muscles intact. No scleral icterus.  MOUTH: Moist mucosal membrane. Dentition intact. No abscess noted.  EAR, NOSE, THROAT: Clear without exudates. No external lesions.  NECK: Supple. No thyromegaly. No nodules. No JVD.  PULMONARY: Diffuse expiratory wheezing most prominent operable lobes diminished  breath sounds right lung base without rales or rhonchi rhonci. No use of accessory muscles, Good respiratory effort. good air entry bilaterally CHEST: Nontender to palpation.  CARDIOVASCULAR: S1 and S2. Regular rate and rhythm. No murmurs, rubs, or gallops. No edema. Pedal pulses 2+ bilaterally.  GASTROINTESTINAL: Soft, nontender, nondistended. No masses. Positive bowel sounds. No hepatosplenomegaly.  MUSCULOSKELETAL: No swelling, clubbing, or edema. Range of motion full in all extremities.  NEUROLOGIC: Cranial nerves II through XII are intact. No gross focal neurological deficits. Sensation intact. Reflexes intact.  SKIN: No ulceration, lesions, rashes, or cyanosis. Skin warm and dry. Turgor intact.  PSYCHIATRIC: Mood, affect within normal limits. The patient is awake, alert and oriented x 3. Insight, judgment intact.    LABORATORY PANEL:   CBC  Recent Labs Lab 07/27/15 1709  WBC 9.1  HGB 13.0  HCT 40.1  PLT 155   ------------------------------------------------------------------------------------------------------------------  Chemistries   Recent Labs Lab 07/27/15 1709  NA 142  K 3.8  CL 104  CO2 30  GLUCOSE 108*  BUN 17  CREATININE 0.84  CALCIUM 8.7*  AST 20  ALT 15  ALKPHOS 63  BILITOT 0.5   ------------------------------------------------------------------------------------------------------------------  Cardiac Enzymes  Recent Labs Lab 07/27/15 1709  TROPONINI 0.03   ------------------------------------------------------------------------------------------------------------------  RADIOLOGY:  Dg Chest Portable 1 View  07/27/2015  CLINICAL DATA:  Difficulty breathing, increased shortness of breath since Wednesday. History of hypertension, COPD, emphysema. EXAM: PORTABLE CHEST 1 VIEW COMPARISON:  Chest x-rays dated 06/04/2015 and 06/03/2015. FINDINGS: Heart size is normal. Lungs are hyperexpanded compatible with the given history of COPD/ emphysema. Lungs  appear clear. No evidence of pneumonia or pleural effusion. Osseous and soft tissue structures about the chest are unremarkable. IMPRESSION: 1. Hyperexpanded lungs consistent with the given history of COPD/emphysema. 2. No evidence of acute cardiopulmonary abnormality. No evidence of pneumonia. Electronically Signed   By: Franki Cabot M.D.   On: 07/27/2015 17:26    EKG:   Orders placed or performed during the hospital encounter of 07/27/15  . ED EKG  . ED EKG  . EKG 12-Lead  . EKG 12-Lead    IMPRESSION AND PLAN:   60 year old Caucasian female history of COPD oxygen at night who is presenting with shortness of breath.  1. Chronic obstructive pulmonary disease exacerbation: Provide DuoNeb treatments q. 4 hours, Solu-Medrol 60 mg IV q. daily, continue Levaquin. Continue with home medications.  2. Hypothyroidism unspecified Synthroid 3. Essential hypertension losartan, hydrochlorothiazide 4. Venous thromboembolism prophylactic: Lovenox     All the records are reviewed and case discussed with ED provider. Management plans discussed with the patient, family and they are in agreement.  CODE STATUS: Full  TOTAL TIME TAKING CARE OF THIS PATIENT: 33 minutes.    Lakeyta Vandenheuvel,  [name] on 07/27/2015 at 6:27 PM  Between 7am to 6pm - Pager - [phone]  After 6pm: House Pager: - [facility]  Office  [phone]  CC: Primary care physician; [name], MD

## 2015-07-27 NOTE — ED Provider Notes (Signed)
[facility] Emergency Department Provider Note  ____________________________________________  Time seen: 5:00 PM  I have reviewed the triage vital signs and the nursing notes.   HISTORY  Chief Complaint Respiratory Distress    HPI Lauren Bartlett is a 60 y.o. female brought to the ED from urgent care due to shortness of breath. She has a history of COPD, has a home oxygen concentrator that she uses intermittently. For the last 2-3 days she's had worsening shortness of breath and productive cough. No fever or chest pain. Was seen at urgent care this morning, given 2 DuoNeb's without improvement. She had an oxygen saturation of 80% on arrival to the urgent care. EMS is put on 4 L nasal cannula to improve her oxygen into the 90s, and given her 2 additional DuoNeb's for a total of 4 DuoNeb's. They did not give any magnesium or steroids due to a questionable history of steroid sensitivity.     Past Medical History  Diagnosis Date  . Congenital absence of one kidney   . History of sciatica   . Depression     treated at [place]  . Hypertension   . hypothyrodism   . COPD (chronic obstructive pulmonary disease) ([place])   . Anxiety   . Emphysema/COPD [facility])      Patient Active Problem List   Diagnosis Date Noted  . Acute respiratory failure ([place]) 06/03/2015  . Acute bronchitis 06/03/2015  . Encounter for preventive health examination 03/14/2015  . Breast cancer screening 03/14/2015  . Need for hepatitis C screening test 03/14/2015  . COPD with hypoxia ([place]) 02/16/2015  . COPD exacerbation ([place]) 02/03/2015  . Vitamin D deficiency 01/11/2015  . S/P hysterectomy with oophorectomy 09/22/2013  . Pulmonary hypertension ([place]) 07/14/2013  . Edema 01/12/2013  . Hypothyroidism 05/04/2012  . Severe mitral regurgitation by prior echocardiogram 01/11/2012  . Obesity (BMI 30-39.9) 08/04/2011  . Depression   . Hypertension   . Congenital absence of one kidney   .  History of sciatica      Past Surgical History  Procedure Laterality Date  . Combined hysterectomy abdominal w/ a&p repair / oophorectomy  1996    benign tumor  . Inner ear surgery      bilateral  . Abdominal hysterectomy    . Breast biopsy Right 2011    [facility]< benign     Current Outpatient Rx  Name  Route  Sig  Dispense  Refill  . albuterol (PROVENTIL HFA;VENTOLIN HFA) 108 (90 BASE) MCG/ACT inhaler   Inhalation   Inhale 2 puffs into the lungs every 6 (six) hours as needed for wheezing or shortness of breath. WITH A SPACER   1 Inhaler   0   . beclomethasone (QVAR) 40 MCG/ACT inhaler   Inhalation   Inhale 2 puffs into the lungs 2 (two) times daily.   1 Inhaler   0   . calcium-vitamin D (OSCAL WITH D) 500-200 MG-UNIT per tablet   Oral   Take 1 tablet by mouth daily with breakfast.   90 tablet   2   . ergocalciferol (DRISDOL) 50000 UNITS capsule   Oral   Take 1 capsule (50,000 Units total) by mouth once a week. Patient taking differently: Take 50,000 Units by mouth once a week. Take on Saturdays.   12 capsule   0   . guaiFENesin (MUCINEX) 600 MG 12 hr tablet   Oral   Take 1 tablet (600 mg total) by mouth 2 (two) times daily.  30 tablet   0   . hydrochlorothiazide (HYDRODIURIL) 25 MG tablet      TAKE 1 TABLET(25 MG) BY MOUTH DAILY   30 tablet   4   . levocetirizine (XYZAL) 5 MG tablet   Oral   Take 1 tablet (5 mg total) by mouth every evening.   30 tablet   5   . levofloxacin (LEVAQUIN) 500 MG tablet   Oral   Take 1 tablet (500 mg total) by mouth daily.   4 tablet   0   . levothyroxine (SYNTHROID, LEVOTHROID) 112 MCG tablet      TAKE 1 TABLET BY MOUTH EVERY DAY   90 tablet   3     **Patient requests 90 days supply**   . loratadine-pseudoephedrine (CLARITIN-D 24-HOUR) 10-240 MG 24 hr tablet   Oral   Take 1 tablet by mouth daily.         [name] losartan (COZAAR) 100 MG tablet   Oral   Take 1 tablet (100 mg total) by mouth daily.   30 tablet    3   . montelukast (SINGULAIR) 10 MG tablet   Oral   Take 1 tablet (10 mg total) by mouth at bedtime.   30 tablet   3   . Umeclidinium-Vilanterol (ANORO ELLIPTA) 62.5-25 MCG/INH AEPB   Inhalation   Inhale 1 puff into the lungs daily.            Allergies Codeine; Penicillin g; Zithromax; and Prednisone   Family History  Problem Relation Age of Onset  . Multiple sclerosis Mother   . Coronary artery disease Father   . Heart disease Brother   . Breast cancer Paternal Aunt     Social History Social History  Substance Use Topics  . Smoking status: Never Smoker   . Smokeless tobacco: Never Used  . Alcohol Use: No    Review of Systems  Constitutional:   No fever or chills.  Eyes:   No vision changes.  ENT:   No sore throat. No rhinorrhea. Cardiovascular:   No chest pain. Respiratory:  Positive shortness of breath and productive cough. Gastrointestinal:   Negative for abdominal pain, vomiting and diarrhea.  Genitourinary:   Negative for dysuria or difficulty urinating. Musculoskeletal:   Negative for focal pain or swelling Neurological:   Negative for headaches 10-point ROS otherwise negative.  ____________________________________________   PHYSICAL EXAM:  VITAL SIGNS: ED Triage Vitals  Enc Vitals Group     BP 07/27/15 1700 136/86 mmHg     Pulse Rate 07/27/15 1700 108     Resp 07/27/15 1703 20     Temp 07/27/15 1703 98.2 F (36.8 C)     Temp Source 07/27/15 1703 Oral     SpO2 07/27/15 1700 98 %     Weight 07/27/15 1703 198 lb 11.2 oz (90.13 kg)     Height 07/27/15 1703 5\' 2"  (1.575 m)     Head Cir --      Peak Flow --      Pain Score 07/27/15 1704 0     Pain Loc --      Pain Edu? --      Excl. in [place]? --     Vital signs reviewed, nursing assessments reviewed.   Constitutional:   Alert and oriented. Moderate respiratory distress Eyes:   No scleral icterus. No conjunctival pallor. PERRL. EOMI.  No nystagmus. ENT   Head:   Normocephalic and  atraumatic.   Nose:   No congestion/rhinnorhea.  No septal hematoma   Mouth/Throat:   MMM, no pharyngeal erythema. No peritonsillar mass.    Neck:   No stridor. No SubQ emphysema. No meningismus. Hematological/Lymphatic/Immunilogical:   No cervical lymphadenopathy. Cardiovascular:   Tachycardia heart rate 110. Symmetric bilateral radial and DP pulses.  No murmurs.  Respiratory:   Tachypnea with increased work of breathing. Prolonged expirations, diffuse expiratory wheezing. Symmetric air entry in all lung fields. Gastrointestinal:   Soft and nontender. Non distended. There is no CVA tenderness.  No rebound, rigidity, or guarding. Genitourinary:   deferred Musculoskeletal:   Nontender with normal range of motion in all extremities. No joint effusions.  No lower extremity tenderness.  No edema. Neurologic:   Normal speech and language.  CN 2-10 normal. Motor grossly intact. No gross focal neurologic deficits are appreciated.  Skin:    Skin is warm, dry and intact. No rash noted.  No petechiae, purpura, or bullae.  ____________________________________________    LABS (pertinent positives/negatives) (all labs ordered are listed, but only abnormal results are displayed) Labs Reviewed  COMPREHENSIVE METABOLIC PANEL - Abnormal; Notable for the following:    Glucose, Bld 108 (*)    Calcium 8.7 (*)    All other components within normal limits  BLOOD GAS, VENOUS - Abnormal; Notable for the following:    pO2, Ven <31.0 (*)    Bicarbonate 33.9 (*)    Acid-Base Excess 6.6 (*)    All other components within normal limits  TROPONIN I  CBC WITH DIFFERENTIAL/PLATELET   ____________________________________________   EKG  Interpreted by me Sinus rhythm rate of 98, normal axis and intervals. Normal QRS ST segments and T waves.  ____________________________________________    RADIOLOGY  Chest x-ray shows hyperinflation but otherwise  unremarkable  ____________________________________________   PROCEDURES   ____________________________________________   INITIAL IMPRESSION / ASSESSMENT AND PLAN / ED COURSE  Pertinent labs & imaging results that were available during my care of the patient were reviewed by me and considered in my medical decision making (see chart for details).  Patient presents with wheezing and acute on chronic respiratory failure with hypoxia. Check ABG, chest x-ray, IV magnesium and Solu-Medrol. IV fluids. Tachycardia likely related to multiple DuoNeb's, but we'll continue to monitor. Low suspicion for PE or ACS.  ----------------------------------------- 6:28 PM on 07/27/2015 -----------------------------------------  Tachycardia improving with fluids. After steroids and Solu-Medrol, shortness of breath has not improved. Still persistently wheezing with expirations and prolonged expiratory phase and tachypnea. Case discussed with the hospitalist for admission.     ____________________________________________   FINAL CLINICAL IMPRESSION(S) / ED DIAGNOSES  Final diagnoses:  COPD with exacerbation ([place])  Acute respiratory failure with hypoxia and hypercapnia ([place])       Portions of this note were generated with dragon dictation software. Dictation errors may occur despite best attempts at proofreading.   Carrie Mew, MD 07/27/15 [phone]

## 2015-07-27 NOTE — ED Notes (Signed)
Patient brought in by [facility] from [facility] Urgent care for difficulty breathing. Patient states that she has had increased shortness of breath since Wednesday. Patient wears PRN O2 at home but for the past few days she has been having to wear her O2 24 hours a day. Patient has received a total of 4 Duoneb treatments. Patient currently NAD.

## 2015-07-27 NOTE — ED Notes (Signed)
Attempted to call report on patient, nurse currently in report. Floor will call oncoming RN when ready for report on patient.

## 2015-07-27 NOTE — ED Notes (Signed)
Report given to Christine, RN

## 2015-07-27 NOTE — ED Notes (Signed)
Patient started having symptoms of chest congestion and cough for 3 days. Patient is having to use supplemental oxygen and normally does not need to use it all the time.

## 2015-07-27 NOTE — ED Provider Notes (Signed)
[facility]  ____________________________________________  Time seen: Approximately 4:04 PM  I have reviewed the triage vital signs and the nursing notes.   HISTORY  Chief Complaint Cough and URI  HPI Lauren Bartlett is a 60 y.o. female  presents with friend at bedside for the complaints of wheezing and shortness of breath. Patient reports that this is been gradual worsening over the last 3-4 days. Patient reports that she has COPD and intermittently has COPD exacerbations. Patient reports that she felt like she may have an upper respiratory infection that's triggering this exacerbation. Reports some runny nose and chest congestion. States that she has an accompanying cough. States overall the cough is a dry nonproductive cough but occasionally greenish phlegm produced. Patient reports the last 3 days she has been having to use her supplemental home oxygen throughout the day. Patient reports that she does still has some accompanying shortness of breath even when using supplemental oxygen. Patient also reports some intermittent dizziness while in exam room.  Denies any chest pain or chest pain with deep breaths. Last hospital admission pressure 1.5 months ago for COPD exacerbation. Patient states that she does monitor her home oxygenation and reports her oxygen was 88% at home this morning without oxygen. Patient this time and states that she feels too short of breath to ambulate for ambulatory oxygenation measure.    denies fevers. Reports continues to drink fluids well but decreased appetite. Patient states that she feels tired. Denies other complaints at this time.  PCP; Derrel Nip Pulmonology: Vella Kohler: Next appointment may 8   Past Medical History  Diagnosis Date  . Congenital absence of one kidney   . History of sciatica   . Depression     treated at [facility]  . Hypertension   . hypothyrodism   . COPD (chronic obstructive pulmonary disease) ([place])   . Anxiety   .  Emphysema/COPD [facility])     Patient Active Problem List   Diagnosis Date Noted  . Acute respiratory failure ([place]) 06/03/2015  . Acute bronchitis 06/03/2015  . Encounter for preventive health examination 03/14/2015  . Breast cancer screening 03/14/2015  . Need for hepatitis C screening test 03/14/2015  . COPD with hypoxia ([place]) 02/16/2015  . COPD exacerbation ([place]) 02/03/2015  . Vitamin D deficiency 01/11/2015  . S/P hysterectomy with oophorectomy 09/22/2013  . Pulmonary hypertension ([place]) 07/14/2013  . Edema 01/12/2013  . Hypothyroidism 05/04/2012  . Severe mitral regurgitation by prior echocardiogram 01/11/2012  . Obesity (BMI 30-39.9) 08/04/2011  . Depression   . Hypertension   . Congenital absence of one kidney   . History of sciatica     Past Surgical History  Procedure Laterality Date  . Combined hysterectomy abdominal w/ a&p repair / oophorectomy  1996    benign tumor  . Inner ear surgery      bilateral  . Abdominal hysterectomy    . Breast biopsy Right 2011    [facility]< benign    Current Outpatient Rx  Name  Route  Sig  Dispense  Refill  . albuterol (PROVENTIL HFA;VENTOLIN HFA) 108 (90 BASE) MCG/ACT inhaler   Inhalation   Inhale 2 puffs into the lungs every 6 (six) hours as needed for wheezing or shortness of breath. WITH A SPACER   1 Inhaler   0   . beclomethasone (QVAR) 40 MCG/ACT inhaler   Inhalation   Inhale 2 puffs into the lungs 2 (two) times daily.   1 Inhaler   0   . calcium-vitamin D (  OSCAL WITH D) 500-200 MG-UNIT per tablet   Oral   Take 1 tablet by mouth daily with breakfast.   90 tablet   2   . ergocalciferol (DRISDOL) 50000 UNITS capsule   Oral   Take 1 capsule (50,000 Units total) by mouth once a week. Patient taking differently: Take 50,000 Units by mouth once a week. Take on Saturdays.   12 capsule   0   . hydrochlorothiazide (HYDRODIURIL) 25 MG tablet      TAKE 1 TABLET(25 MG) BY MOUTH DAILY   30 tablet   4   . levothyroxine  (SYNTHROID, LEVOTHROID) 112 MCG tablet      TAKE 1 TABLET BY MOUTH EVERY DAY   90 tablet   3     **Patient requests 90 days supply**   . loratadine-pseudoephedrine (CLARITIN-D 24-HOUR) 10-240 MG 24 hr tablet   Oral   Take 1 tablet by mouth daily.         [name] losartan (COZAAR) 100 MG tablet   Oral   Take 1 tablet (100 mg total) by mouth daily.   30 tablet   3   . montelukast (SINGULAIR) 10 MG tablet   Oral   Take 1 tablet (10 mg total) by mouth at bedtime.   30 tablet   3   . Umeclidinium-Vilanterol (ANORO ELLIPTA) 62.5-25 MCG/INH AEPB   Inhalation   Inhale 1 puff into the lungs daily.         [name] guaiFENesin (MUCINEX) 600 MG 12 hr tablet   Oral   Take 1 tablet (600 mg total) by mouth 2 (two) times daily.   30 tablet   0   . levocetirizine (XYZAL) 5 MG tablet   Oral   Take 1 tablet (5 mg total) by mouth every evening.   30 tablet   5   . levofloxacin (LEVAQUIN) 500 MG tablet   Oral   Take 1 tablet (500 mg total) by mouth daily.   4 tablet   0     Allergies Codeine; Penicillin g; Zithromax; and Prednisone  Family History  Problem Relation Age of Onset  . Multiple sclerosis Mother   . Coronary artery disease Father   . Heart disease Brother   . Breast cancer Paternal Aunt     Social History Social History  Substance Use Topics  . Smoking status: Never Smoker   . Smokeless tobacco: Never Used  . Alcohol Use: No    Review of Systems Constitutional: No fever/chills Eyes: No visual changes. ENT: States mild sore throat when actively coughing. Positive nasal congestion and chest congestion. Cardiovascular: Denies chest pain. Respiratory: Denies shortness of breath. Gastrointestinal: No abdominal pain.  No nausea, no vomiting.  No diarrhea.  No constipation. Genitourinary: Negative for dysuria. Musculoskeletal: Negative for back pain. Skin: Negative for rash. Neurological: Negative for headaches, focal weakness or numbness.  10-point ROS otherwise  negative.  ____________________________________________   PHYSICAL EXAM:  VITAL SIGNS: ED Triage Vitals  Enc Vitals Group     BP 07/27/15 1453 111/65 mmHg     Pulse Rate 07/27/15 1453 100     Resp 07/27/15 1453 20     Temp 07/27/15 1453 98 F (36.7 C)     Temp Source 07/27/15 1453 Oral     SpO2 07/27/15 1453 96 %     Weight 07/27/15 1453 195 lb (88.451 kg)     Height 07/27/15 1453 5\' 2"  (1.575 m)     Head Cir --  Peak Flow --      Pain Score 07/27/15 1500 0     Pain Loc --      Pain Edu? --      Excl. in [place]? --     Constitutional: Alert and oriented. Well appearing and in no acute distress. Eyes: Conjunctivae are normal. PERRL. EOMI. Head: Atraumatic.No sinus tenderness to palpation.   Ears: no erythema, normal TMs bilaterally.   Nose: No congestion/rhinnorhea.  Mouth/Throat: Mucous membranes are moist.  Oropharynx non-erythematous. no tonsillar swelling or exudate.  Neck: No stridor.  No cervical spine tenderness to palpation. Hematological/Lymphatic/Immunilogical: No cervical lymphadenopathy. Cardiovascular: Tachycardia. Systolic murmur heard, otherwise grossly normal heart sounds.  Good peripheral circulation. Respiratory: Patient using accessory muscles. Speaks in 3-4 word sentences. Inspiratory and expiratory wheezes throughout. No rhonchi auscultated. No focal area of consolidation auscultated.  trointestinal: Soft and nontender. No CVA tenderness. Musculoskeletal: No lower or upper extremity tenderness nor edema. Bilateral pedal pulses equal and easily palpated.  Neurologic:  Normal speech and language. No gross focal neurologic deficits are appreciated. Skin:  Skin is warm, dry and intact. No rash noted. Psychiatric: Mood and affect are normal. Speech and behavior are normal.   ED ECG REPORT I, Marylene Land, the attending provider, personally viewed and interpreted this ECG.   Date: 07/27/2015  EKG Time: 1602  Rate: 99  Rhythm: normal EKG, normal sinus  rhythm, unchanged from previous tracings, normal sinus rhythm  Axis: possible left atrial enlargement  Intervals:none  ST&T Change: none ____________________________________________   LABS (all labs ordered are listed, but only abnormal results are displayed)  Labs Reviewed - No data to display  Meds ordered this encounter  Medications  . ipratropium-albuterol (DUONEB) 0.5-2.5 (3) MG/3ML nebulizer solution 3 mL    Sig:     INITIAL IMPRESSION / ASSESSMENT AND PLAN / ED COURSE  Pertinent labs & imaging results that were available during my care of the patient were reviewed by me and considered in my medical decision making (see chart for details).  Patient presents to urgent care using home supplemental oxygen. Patient oxygenation at this time 92% on room air with heart rate of 108, with accessory muscles use. Patient also appears anxious. Patient states she does not feel like she is strong enough to ambulate at this time to obtain ambulatory oxygenation saturation. Of note, last hospitalization patient o2 sat dropped to 73% with ambulation per notes. Patient with recent COPD exacerbation hospitalization. Patient also reports unable to tolerate oral prednisone and unsure if she can tolerate IM or IV steroids.   As patient with extensive COPD exacerbation recent history as well as patient short of breath with some accompanying dizziness and lower oxygenation saturation, recommend patient to be further evaluated and treated in the emergency room at this time. As patient states that she is actually short of breath as well as with the accompanying intermittent dizziness recommended for patient to go by EMS transportation. Patient states she does not feel she is stable to drive herself this time. Annie Main RN called EMS respiration. Patient states that she will go to [facility] regional ER. Patient wheezes mildly improved post two duonebs in urgent care, and reports still short of breath.   EMS at  bedside. Patient stable at time of transfer. [facility] ER charge RN Myriam Jacobson called and report given.   Discussed follow up with Primary care physician this week. Discussed follow up and return parameters including no resolution or any worsening concerns. Patient verbalized understanding and agreed to  plan.   ____________________________________________   FINAL CLINICAL IMPRESSION(S) / ED DIAGNOSES  Final diagnoses:  COPD exacerbation ([place])      Note: This dictation was prepared with Dragon dictation along with smaller phrase technology. Any transcriptional errors that result from this process are unintentional.    Marylene Land, NP 07/27/15 [phone]

## 2015-07-28 LAB — CBC
HCT: 37.3 % (ref 35.0–47.0)
HEMOGLOBIN: 12.4 g/dL (ref 12.0–16.0)
MCH: 29.7 pg (ref 26.0–34.0)
MCHC: 33.3 g/dL (ref 32.0–36.0)
MCV: 89.3 fL (ref 80.0–100.0)
Platelets: 131 10*3/uL — ABNORMAL LOW (ref 150–440)
RBC: 4.18 MIL/uL (ref 3.80–5.20)
RDW: 14 % (ref 11.5–14.5)
WBC: 5.1 10*3/uL (ref 3.6–11.0)

## 2015-07-28 LAB — BASIC METABOLIC PANEL
ANION GAP: 11 (ref 5–15)
BUN: 17 mg/dL (ref 6–20)
CALCIUM: 8.6 mg/dL — AB (ref 8.9–10.3)
CHLORIDE: 108 mmol/L (ref 101–111)
CO2: 25 mmol/L (ref 22–32)
Creatinine, Ser: 0.59 mg/dL (ref 0.44–1.00)
GFR calc non Af Amer: >60 mL/min (ref >60)
Glucose, Bld: 126 mg/dL — ABNORMAL HIGH (ref 65–99)
Potassium: 4.3 mmol/L (ref 3.5–5.1)
Sodium: 144 mmol/L (ref 135–145)

## 2015-07-28 LAB — HEMOGLOBIN A1C: Hgb A1c MFr Bld: 5.4 % (ref 4.0–6.0)

## 2015-07-28 NOTE — Progress Notes (Signed)
[facility] NAME: Lauren Bartlett    MR#:  [id number]  DATE OF BIRTH:  1955-04-22  SUBJECTIVE:  CHIEF COMPLAINT:   Chief Complaint  Patient presents with  . Respiratory Distress  Patient is 60 year old Caucasian female with past medical history significant for history of COPD, chronic respiratory failure, requiring oxygen at nighttime only, hypertension, hyperlipidemia who presents for hospital with complaints of shortness of breath, cough, greenish sputum production. On arrival to the hospital patient was noted to have hypoxemia but not hypercarbia, labs otherwise were unremarkable. Chest x-ray showed no acute cardiopulmonary abnormality, no pneumonia. Patient feels somewhat better today, admits of cough, clear to whitish sputum production, still feels short of breath and very weak  Review of Systems  Constitutional: Negative for fever, chills and weight loss.  HENT: Negative for congestion.   Eyes: Negative for blurred vision and double vision.  Respiratory: Positive for cough, sputum production, shortness of breath and wheezing.   Cardiovascular: Negative for chest pain, palpitations, orthopnea, leg swelling and PND.  Gastrointestinal: Negative for nausea, vomiting, abdominal pain, diarrhea, constipation and blood in stool.  Genitourinary: Negative for dysuria, urgency, frequency and hematuria.  Musculoskeletal: Negative for falls.  Neurological: Positive for weakness. Negative for dizziness, tremors, focal weakness and headaches.  Endo/Heme/Allergies: Does not bruise/bleed easily.  Psychiatric/Behavioral: Negative for depression. The patient does not have insomnia.     VITAL SIGNS: Blood pressure 121/62, pulse 77, temperature 98.2 F (36.8 C), temperature source Oral, resp. rate 20, height 5\' 2"  (1.575 m), weight 87.045 kg (191 lb 14.4 oz), SpO2 96 %.  PHYSICAL EXAMINATION:   GENERAL:  60 y.o.-year-old patient lying in the  bed in mild-to-moderate respiratory distress. Intermittent cough and shortness of breath with cough EYES: Pupils equal, round, reactive to light and accommodation. No scleral icterus. Extraocular muscles intact.  HEENT: Head atraumatic, normocephalic. Oropharynx and nasopharynx clear.  NECK:  Supple, no jugular venous distention. No thyroid enlargement, no tenderness.  LUNGS: Some diminished breath sounds bilaterally posteriorly, no wheezing, few scattered rales,rhonchi , but no crepitations noted. Intermittent use of accessory muscles of respiration, especially with exertion, cough.  CARDIOVASCULAR: S1, S2 normal. No murmurs, rubs, or gallops.  ABDOMEN: Soft, nontender, nondistended. Bowel sounds present. No organomegaly or mass.  EXTREMITIES: No pedal edema, cyanosis, or clubbing.  NEUROLOGIC: Cranial nerves II through XII are intact. Muscle strength 5/5 in all extremities. Sensation intact. Gait not checked.  PSYCHIATRIC: The patient is alert and oriented x 3. Sad affect SKIN: No obvious rash, lesion, or ulcer.   ORDERS/RESULTS REVIEWED:   CBC  Recent Labs Lab 07/27/15 1709 07/28/15 0553  WBC 9.1 5.1  HGB 13.0 12.4  HCT 40.1 37.3  PLT 155 131*  MCV 90.4 89.3  MCH 29.4 29.7  MCHC 32.5 33.3  RDW 14.5 14.0  LYMPHSABS 1.5  --   MONOABS 0.8  --   EOSABS 0.7  --   BASOSABS 0.0  --    ------------------------------------------------------------------------------------------------------------------  Chemistries   Recent Labs Lab 07/27/15 1709 07/28/15 0553  NA 142 144  K 3.8 4.3  CL 104 108  CO2 30 25  GLUCOSE 108* 126*  BUN 17 17  CREATININE 0.84 0.59  CALCIUM 8.7* 8.6*  AST 20  --   ALT 15  --   ALKPHOS 63  --   BILITOT 0.5  --    ------------------------------------------------------------------------------------------------------------------ estimated creatinine clearance is 77.6 mL/min (by C-G formula based on Cr of  0.59). ------------------------------------------------------------------------------------------------------------------  No results for input(s): TSH, T4TOTAL, T3FREE, THYROIDAB in the last 72 hours.  Invalid input(s): FREET3  Cardiac Enzymes  Recent Labs Lab 07/27/15 1709  TROPONINI 0.03   ------------------------------------------------------------------------------------------------------------------ Invalid input(s): POCBNP ---------------------------------------------------------------------------------------------------------------  RADIOLOGY: Dg Chest Portable 1 View  07/27/2015  CLINICAL DATA:  Difficulty breathing, increased shortness of breath since Wednesday. History of hypertension, COPD, emphysema. EXAM: PORTABLE CHEST 1 VIEW COMPARISON:  Chest x-rays dated 06/04/2015 and 06/03/2015. FINDINGS: Heart size is normal. Lungs are hyperexpanded compatible with the given history of COPD/ emphysema. Lungs appear clear. No evidence of pneumonia or pleural effusion. Osseous and soft tissue structures about the chest are unremarkable. IMPRESSION: 1. Hyperexpanded lungs consistent with the given history of COPD/emphysema. 2. No evidence of acute cardiopulmonary abnormality. No evidence of pneumonia. Electronically Signed   By: Franki Cabot M.D.   On: 07/27/2015 17:26    EKG:  Orders placed or performed during the hospital encounter of 07/27/15  . ED EKG  . ED EKG  . EKG 12-Lead  . EKG 12-Lead    ASSESSMENT AND PLAN:  Active Problems:   COPD exacerbation ([place]) #1. Acute on chronic respiratory failure with hypoxia due to COPD exacerbation, continue oxygen therapy as needed, continue duo nebs, inhaled steroids, intravenous steroids, levofloxacin, added Humibid , follow clinically #2. COPD exacerbation, as above. We'll continue inhaled and intravenous steroids, oxygen, nebulizers, following clinically. #3. Thrombocytopenia, follow with therapy #4. Hyperglycemia, get hemoglobin  A1c #5. Generalized weakness, initiate physical therapy, patient may need to go to skilled nursing facility for rehabilitation  Management plans discussed with the patient, family and they are in agreement.   DRUG ALLERGIES:  Allergies  Allergen Reactions  . Codeine Hives and Nausea And Vomiting  . Penicillin G Itching and Swelling    Has patient had a PCN reaction causing immediate rash, facial/tongue/throat swelling, SOB or lightheadedness with hypotension: Yes Has patient had a PCN reaction causing severe rash involving mucus membranes or skin necrosis: No Has patient had a PCN reaction that required hospitalization No Has patient had a PCN reaction occurring within the last 10 years: No If all of the above answers are "NO", then may proceed with Cephalosporin use.  . Zithromax [Azithromycin] Other (See Comments)    z pack causes hallucinations per pt   . Prednisone Palpitations and Other (See Comments)    Patient states this medications made her hallucinate.    CODE STATUS:     Code Status Orders        Start     Ordered   07/27/15 1807  Full code   Continuous     07/27/15 1806    Code Status History    Date Active Date Inactive Code Status Order ID Comments User Context   06/03/2015  1:42 PM 06/05/2015  5:01 PM Full Code [id number]  Idelle Crouch, MD Inpatient   02/16/2015  9:36 AM 02/18/2015  4:33 PM Full Code [id number]  Fritzi Mandes, MD Inpatient   02/03/2015  2:32 AM 02/05/2015  4:34 PM Full Code [id number]  Lytle Butte, MD ED      TOTAL TIME TAKING CARE OF THIS PATIENT: 40 minutes.    Theodoro Grist M.D on 07/28/2015 at 11:20 AM  Between 7am to 6pm - Pager - [phone]  After 6pm go to [email or web address] - password [email or web address] [place] [facility]  Office  [phone]  CC: Primary care physician; [name], MD

## 2015-07-29 ENCOUNTER — Inpatient Hospital Stay: Payer: Medicare Other

## 2015-07-29 MED ORDER — DOCUSATE SODIUM 100 MG PO CAPS
100.0000 mg | ORAL_CAPSULE | Freq: Two times a day (BID) | ORAL | Status: DC
  Administered 2015-07-29 – 2015-08-01 (×7): 100 mg via ORAL
  Filled 2015-07-29 (×7): qty 1

## 2015-07-29 MED ORDER — SENNA 8.6 MG PO TABS
1.0000 | ORAL_TABLET | Freq: Every day | ORAL | Status: DC | PRN
  Filled 2015-07-29: qty 1

## 2015-07-29 MED ORDER — IOPAMIDOL (ISOVUE-370) INJECTION 76%
75.0000 mL | Freq: Once | INTRAVENOUS | Status: AC | PRN
  Administered 2015-07-29: 75 mL via INTRAVENOUS

## 2015-07-29 MED ORDER — SODIUM CHLORIDE 0.9 % IV SOLN
INTRAVENOUS | Status: AC
  Administered 2015-07-29: 15:00:00 via INTRAVENOUS

## 2015-07-29 NOTE — Progress Notes (Signed)
Patient ambulated about 320 ft with portable oxygen at 3L.  Patient tolerated walk well with the exception of oxygen saturation falling to 70%.  She was at 96% at rest on 3L of O2.  Christene Slates  07/29/2015  12:36 AM

## 2015-07-29 NOTE — Progress Notes (Addendum)
[facility] [place] NAME: Lauren Bartlett    MR#:  [id number]  DATE OF BIRTH:  10/10/1955  SUBJECTIVE:  CHIEF COMPLAINT:   Chief Complaint  Patient presents with  . Respiratory Distress  Patient is 60 year old Caucasian female with past medical history significant for history of COPD, chronic respiratory failure, requiring oxygen at nighttime only, hypertension, hyperlipidemia who presents for hospital with complaints of shortness of breath, cough, greenish sputum production. On arrival to the hospital patient was noted to have hypoxemia but not hypercarbia, labs otherwise were unremarkable. Chest x-ray showed no acute cardiopulmonary abnormality, no pneumonia. Patient feels Weak today, although ambulated 320 feet with oxygen yesterday, feels that cough is subsiding, admits of some shortness of breath, worse than usual. Was not seen by physical therapist yet.  Review of Systems  Constitutional: Negative for fever, chills and weight loss.  HENT: Negative for congestion.   Eyes: Negative for blurred vision and double vision.  Respiratory: Positive for cough, sputum production, shortness of breath and wheezing.   Cardiovascular: Negative for chest pain, palpitations, orthopnea, leg swelling and PND.  Gastrointestinal: Negative for nausea, vomiting, abdominal pain, diarrhea, constipation and blood in stool.  Genitourinary: Negative for dysuria, urgency, frequency and hematuria.  Musculoskeletal: Negative for falls.  Neurological: Positive for weakness. Negative for dizziness, tremors, focal weakness and headaches.  Endo/Heme/Allergies: Does not bruise/bleed easily.  Psychiatric/Behavioral: Negative for depression. The patient does not have insomnia.     VITAL SIGNS: Blood pressure 129/65, pulse 105, temperature 97.5 F (36.4 C), temperature source Oral, resp. rate 20, height 5\' 2"  (1.575 m), weight 87.045 kg (191 lb 14.4 oz), SpO2 94  %.  PHYSICAL EXAMINATION:   GENERAL:  60 y.o.-year-old patient lying in the bed , no significant respiratory distress. Minimal cough  EYES: Pupils equal, round, reactive to light and accommodation. No scleral icterus. Extraocular muscles intact.  HEENT: Head atraumatic, normocephalic. Oropharynx and nasopharynx clear.  NECK:  Supple, no jugular venous distention. No thyroid enlargement, no tenderness.  LUNGS: Some diminished breath sounds bilaterally posteriorly, mostly at bases, no wheezing, few scattered rales,rhonchi , but no crepitations noted.  CARDIOVASCULAR: S1, S2 normal. No murmurs, rubs, or gallops.  ABDOMEN: Soft, nontender, nondistended. Bowel sounds present. No organomegaly or mass.  EXTREMITIES: No pedal edema, cyanosis, or clubbing.  NEUROLOGIC: Cranial nerves II through XII are intact. Muscle strength 5/5 in all extremities. Sensation intact. Gait not checked.  PSYCHIATRIC: The patient is alert and oriented x 3. More engaging in conversation today SKIN: No obvious rash, lesion, or ulcer.   ORDERS/RESULTS REVIEWED:   CBC  Recent Labs Lab 07/27/15 1709 07/28/15 0553  WBC 9.1 5.1  HGB 13.0 12.4  HCT 40.1 37.3  PLT 155 131*  MCV 90.4 89.3  MCH 29.4 29.7  MCHC 32.5 33.3  RDW 14.5 14.0  LYMPHSABS 1.5  --   MONOABS 0.8  --   EOSABS 0.7  --   BASOSABS 0.0  --    ------------------------------------------------------------------------------------------------------------------  Chemistries   Recent Labs Lab 07/27/15 1709 07/28/15 0553  NA 142 144  K 3.8 4.3  CL 104 108  CO2 30 25  GLUCOSE 108* 126*  BUN 17 17  CREATININE 0.84 0.59  CALCIUM 8.7* 8.6*  AST 20  --   ALT 15  --   ALKPHOS 63  --   BILITOT 0.5  --    ------------------------------------------------------------------------------------------------------------------ estimated creatinine clearance is 77.6 mL/min (by C-G formula based on Cr of  0.59). ------------------------------------------------------------------------------------------------------------------ No results for input(s): TSH, T4TOTAL, T3FREE, THYROIDAB in the last 72 hours.  Invalid input(s): FREET3  Cardiac Enzymes  Recent Labs Lab 07/27/15 1709  TROPONINI 0.03   ------------------------------------------------------------------------------------------------------------------ Invalid input(s): POCBNP ---------------------------------------------------------------------------------------------------------------  RADIOLOGY: Dg Chest Portable 1 View  07/27/2015  CLINICAL DATA:  Difficulty breathing, increased shortness of breath since Wednesday. History of hypertension, COPD, emphysema. EXAM: PORTABLE CHEST 1 VIEW COMPARISON:  Chest x-rays dated 06/04/2015 and 06/03/2015. FINDINGS: Heart size is normal. Lungs are hyperexpanded compatible with the given history of COPD/ emphysema. Lungs appear clear. No evidence of pneumonia or pleural effusion. Osseous and soft tissue structures about the chest are unremarkable. IMPRESSION: 1. Hyperexpanded lungs consistent with the given history of COPD/emphysema. 2. No evidence of acute cardiopulmonary abnormality. No evidence of pneumonia. Electronically Signed   By: Franki Cabot M.D.   On: 07/27/2015 17:26    EKG:  Orders placed or performed during the hospital encounter of 07/27/15  . ED EKG  . ED EKG  . EKG 12-Lead  . EKG 12-Lead    ASSESSMENT AND PLAN:  Active Problems:   COPD exacerbation ([place]) #1. Acute on chronic respiratory failure with hypoxia due to COPD exacerbation, continue oxygen therapy as needed,Weaning patient to room air as tolerated, continue duo nebs, inhaled steroids, discontinue intravenous  steroids, continue levofloxacin, continue Humibid , some better clinically. Get CT angiogram of the chest to rule out pulmonary embolism. #2. COPD exacerbation, as above. We'll continue inhaled,  not  intravenous steroids, oxygen, nebulizers, stable, improving clinically. Weaning patient to room air as tolerated, as patient was on room air at home. CT angiogram of the chest to rule out PE #3. Thrombocytopenia, recheck tomorrow morning #4. Hyperglycemia, hemoglobin A1c was 5.4, no diabetes #5. Generalized weakness, still awaiting for physical therapy evaluation, although ambulated more than 300 feet around nursing station. Possible discharge tomorrow, if no skilled nursing facility recommendations #6. Lower extremity swelling, get Doppler ultrasound to rule out DVT #7. Constipation, initiate patient on Colace, following closely Management plans discussed with the patient, family and they are in agreement.   DRUG ALLERGIES:  Allergies  Allergen Reactions  . Codeine Hives and Nausea And Vomiting  . Penicillin G Itching and Swelling    Has patient had a PCN reaction causing immediate rash, facial/tongue/throat swelling, SOB or lightheadedness with hypotension: Yes Has patient had a PCN reaction causing severe rash involving mucus membranes or skin necrosis: No Has patient had a PCN reaction that required hospitalization No Has patient had a PCN reaction occurring within the last 10 years: No If all of the above answers are "NO", then may proceed with Cephalosporin use.  . Zithromax [Azithromycin] Other (See Comments)    z pack causes hallucinations per pt   . Prednisone Palpitations and Other (See Comments)    Patient states this medications made her hallucinate.    CODE STATUS:     Code Status Orders        Start     Ordered   07/27/15 1807  Full code   Continuous     07/27/15 1806    Code Status History    Date Active Date Inactive Code Status Order ID Comments User Context   06/03/2015  1:42 PM 06/05/2015  5:01 PM Full Code [id number]  Idelle Crouch, MD Inpatient   02/16/2015  9:36 AM 02/18/2015  4:33 PM Full Code [id number]  Fritzi Mandes, MD Inpatient   02/03/2015  2:32 AM  02/05/2015  4:34 PM Full  Code [id number]  Lytle Butte, MD ED      TOTAL TIME TAKING CARE OF THIS PATIENT: 30 minutes.    Theodoro Grist M.D on 07/29/2015 at 10:55 AM  Between 7am to 6pm - Pager - [phone]  After 6pm go to [email or web address] - password [email or web address] [facility]  Office  [phone]  CC: Primary care physician; [name], MD

## 2015-07-30 ENCOUNTER — Inpatient Hospital Stay: Admit: 2015-07-30 | Payer: Medicare Other

## 2015-07-30 ENCOUNTER — Ambulatory Visit: Payer: Medicare Other | Admitting: Family Medicine

## 2015-07-30 MED ORDER — BISACODYL 10 MG RE SUPP
10.0000 mg | Freq: Once | RECTAL | Status: AC
Start: 2015-07-30 — End: 2015-07-30
  Administered 2015-07-30: 10 mg via RECTAL
  Filled 2015-07-30: qty 1

## 2015-07-30 MED ORDER — FUROSEMIDE 40 MG PO TABS
20.0000 mg | ORAL_TABLET | Freq: Once | ORAL | Status: AC
  Administered 2015-07-30: 20 mg via ORAL
  Filled 2015-07-30: qty 1

## 2015-07-30 MED ORDER — PROCHLORPERAZINE EDISYLATE 5 MG/ML IJ SOLN
5.0000 mg | Freq: Four times a day (QID) | INTRAMUSCULAR | Status: DC | PRN
  Administered 2015-07-30 – 2015-07-31 (×3): 5 mg via INTRAVENOUS
  Filled 2015-07-30 (×5): qty 1

## 2015-07-30 MED ORDER — POTASSIUM CHLORIDE CRYS ER 10 MEQ PO TBCR
10.0000 meq | EXTENDED_RELEASE_TABLET | Freq: Once | ORAL | Status: AC
  Administered 2015-07-30: 10 meq via ORAL
  Filled 2015-07-30: qty 1

## 2015-07-30 MED ORDER — ONDANSETRON HCL 4 MG PO TABS: 4.0000 mg | ORAL_TABLET | Freq: Four times a day (QID) | ORAL | Status: DC | PRN

## 2015-07-30 MED ORDER — ONDANSETRON HCL 4 MG/2ML IJ SOLN
4.0000 mg | INTRAMUSCULAR | Status: DC | PRN
  Administered 2015-07-30 – 2015-07-31 (×3): 4 mg via INTRAVENOUS
  Filled 2015-07-30 (×3): qty 2

## 2015-07-30 NOTE — Progress Notes (Addendum)
[facility] [place] NAME: Lauren Bartlett    MR#:  [id number]  DATE OF BIRTH:  [birth year]  SUBJECTIVE:  CHIEF COMPLAINT:   Chief Complaint  Patient presents with  . Respiratory Distress  Patient is 60 year old Caucasian female with past medical history significant for history of COPD, chronic respiratory failure, requiring oxygen at nighttime ,according to patient, however, around the clock per oxygen delivery company , hypertension, hyperlipidemia who presents for hospital with complaints of shortness of breath, cough, greenish sputum production. On arrival to the hospital patient was noted to have hypoxemia but not hypercarbia, labs otherwise were unremarkable. Chest x-ray showed no acute cardiopulmonary abnormality, no pneumonia. Patient feels Better today, less shortness of breath. She does not want to go home, claims that her father had an eye operation and it would be preferable if he came to be her up tomorrow, remains on 2 L of oxygen through nasal cannula with good oxygenation. Patient denies any pain Review of Systems  Constitutional: Negative for fever, chills and weight loss.  HENT: Negative for congestion.   Eyes: Negative for blurred vision and double vision.  Respiratory: Positive for cough, sputum production, shortness of breath and wheezing.   Cardiovascular: Negative for chest pain, palpitations, orthopnea, leg swelling and PND.  Gastrointestinal: Negative for nausea, vomiting, abdominal pain, diarrhea, constipation and blood in stool.  Genitourinary: Negative for dysuria, urgency, frequency and hematuria.  Musculoskeletal: Negative for falls.  Neurological: Positive for weakness. Negative for dizziness, tremors, focal weakness and headaches.  Endo/Heme/Allergies: Does not bruise/bleed easily.  Psychiatric/Behavioral: Negative for depression. The patient does not have insomnia.     VITAL SIGNS: Blood pressure 112/60, pulse  84, temperature 97.7 F (36.5 C), temperature source Oral, resp. rate 20, height 5\' 2"  (1.575 m), weight 87.045 kg (191 lb 14.4 oz), SpO2 98 %.  PHYSICAL EXAMINATION:   GENERAL:  60 y.o.-year-old patient lying in the bed , no significant respiratory distress. Minimal cough  EYES: Pupils equal, round, reactive to light and accommodation. No scleral icterus. Extraocular muscles intact.  HEENT: Head atraumatic, normocephalic. Oropharynx and nasopharynx clear.  NECK:  Supple, no jugular venous distention. No thyroid enlargement, no tenderness.  LUNGS: Good breath sounds bilaterally , , no wheezing, few scattered rales,rhonchi , but no crepitations noted.  CARDIOVASCULAR: S1, S2 normal. 3/6 systolic murmur , radiating to left axilla and left neck area, no rubs, or gallops.  ABDOMEN: Soft, nontender, nondistended. Bowel sounds present. No organomegaly or mass.  EXTREMITIES: No pedal edema, cyanosis, or clubbing.  NEUROLOGIC: Cranial nerves II through XII are intact. Muscle strength 5/5 in all extremities. Sensation intact. Gait not checked.  PSYCHIATRIC: The patient is alert and oriented x 3. More engaging in conversation today SKIN: No obvious rash, lesion, or ulcer.   ORDERS/RESULTS REVIEWED:   CBC  Recent Labs Lab 07/27/15 1709 07/28/15 0553  WBC 9.1 5.1  HGB 13.0 12.4  HCT 40.1 37.3  PLT 155 131*  MCV 90.4 89.3  MCH 29.4 29.7  MCHC 32.5 33.3  RDW 14.5 14.0  LYMPHSABS 1.5  --   MONOABS 0.8  --   EOSABS 0.7  --   BASOSABS 0.0  --    ------------------------------------------------------------------------------------------------------------------  Chemistries   Recent Labs Lab 07/27/15 1709 07/28/15 0553  NA 142 144  K 3.8 4.3  CL 104 108  CO2 30 25  GLUCOSE 108* 126*  BUN 17 17  CREATININE 0.84 0.59  CALCIUM 8.7* 8.6*  AST  20  --   ALT 15  --   ALKPHOS 63  --   BILITOT 0.5  --     ------------------------------------------------------------------------------------------------------------------ estimated creatinine clearance is 77.6 mL/min (by C-G formula based on Cr of 0.59). ------------------------------------------------------------------------------------------------------------------ No results for input(s): TSH, T4TOTAL, T3FREE, THYROIDAB in the last 72 hours.  Invalid input(s): FREET3  Cardiac Enzymes  Recent Labs Lab 07/27/15 1709  TROPONINI 0.03   ------------------------------------------------------------------------------------------------------------------ Invalid input(s): POCBNP ---------------------------------------------------------------------------------------------------------------  RADIOLOGY: Ct Angio Chest Pe W/cm &/or Wo Cm  07/29/2015  CLINICAL DATA:  60 year old female with worsening shortness of breath, cough and sputum production (occasional green sputum). EXAM: CT ANGIOGRAPHY CHEST WITH CONTRAST TECHNIQUE: Multidetector CT imaging of the chest was performed using the standard protocol during bolus administration of intravenous contrast. Multiplanar CT image reconstructions and MIPs were obtained to evaluate the vascular anatomy. CONTRAST:  150 mL of Isovue 370 (divided boluses of 75 mL each, with repeat scan performed secondary to nondiagnostic images on the initial scan). COMPARISON:  No priors. FINDINGS: Comment: There is significant respiratory motion which limits the diagnostic sensitivity and specificity of this examination. Mediastinum/Lymph Nodes: There is no evidence of clinically relevant central, lobar or segmental sized pulmonary embolism. Smaller subsegmental sized pulmonary emboli cannot be entirely excluded secondary to extensive respiratory motion. Heart size is mildly enlarged with left atrial dilatation. There is no significant pericardial fluid, thickening or pericardial calcification. No pathologically enlarged  mediastinal or hilar lymph nodes. Aberrant right subclavian artery (normal anatomical variant) incidentally noted. Significant compression of the trachea and mainstem bronchi are noted on expiratory phase imaging during both of the acquisitions, indicative of tracheobronchomalacia. Esophagus is unremarkable in appearance. No axillary lymphadenopathy. Lungs/Pleura: Mild linear scarring in the left lower lobe. No acute consolidative airspace disease. No pleural effusions. Mosaic attenuation throughout the lungs bilaterally, suggestive of air trapping from small airways disease. No suspicious appearing pulmonary nodules or masses. Upper Abdomen: Unremarkable. Musculoskeletal/Soft Tissues: There are no aggressive appearing lytic or blastic lesions noted in the visualized portions of the skeleton. Review of the MIP images confirms the above findings. IMPRESSION: 1. Despite the limitations of today's examination, there is no evidence to suggest clinically relevant central, lobar or segmental sized pulmonary embolism. 2. Cardiomegaly with left atrial dilatation. 3. Tracheobronchomalacia. 4. Air trapping in the lungs bilaterally, indicative of small airways disease. 5. Aberrant right subclavian artery (normal anatomical variant) incidentally noted. Electronically Signed   By: Vinnie Langton M.D.   On: 07/29/2015 11:26   US Venous Img Lower Bilateral  07/29/2015  CLINICAL DATA:  Short of breath, swelling, hypoxia intermittently for 2 months. EXAM: BILATERAL LOWER EXTREMITY VENOUS DUPLEX ULTRASOUND TECHNIQUE: Doppler venous assessment of the bilateral lower extremity deep venous system was performed, including characterization of spectral flow, compressibility, and phasicity. COMPARISON:  None. FINDINGS: There is complete compressibility of the bilateral common femoral, femoral, and popliteal veins. Doppler analysis demonstrates respiratory phasicity and augmentation of flow with calf compression. IMPRESSION: No evidence  of lower extremity DVT. Electronically Signed   By: Marybelle Killings M.D.   On: 07/29/2015 14:06    EKG:  Orders placed or performed during the hospital encounter of 07/27/15  . ED EKG  . ED EKG  . EKG 12-Lead  . EKG 12-Lead    ASSESSMENT AND PLAN:  Active Problems:   COPD exacerbation ([place]) #1. Acute on chronic respiratory failure with hypoxia due to COPD exacerbation, ? Diastolic CHF, continue oxygen therapy as needed, now on 2 L of oxygen per nasal cannula,  continue duo nebs, inhaled steroids,  levofloxacin, Humibid , better clinically. CT angiogram showed no pulmonary embolism, but air trapping consistent with COPD. Give one dose of Lasix orally today, follow oxygenation tomorrow. Get echocardiogram done to rule out pulmonary hypertension, shunting, valvular insufficiency, cardiomyopathy. #2. COPD exacerbation, as above. We'll continue inhaled steroids, oxygen, nebulizers, improving overall clinically. Weaning patient to room air as tolerated, but patient was on oxygen per oxygen delivery company at home 24/ 7. CT angiogram showed no  PE #3. Thrombocytopenia, recheck in the morning #4. Hyperglycemia, hemoglobin A1c was 5.4, no diabetes #5. Generalized weakness, the patient ambulated more than 300 feet around nursing station in the past few days. Likely discharge tomorrow, if able to wean to room air #6. Lower extremity swelling, Doppler ultrasound showed no DVT, I'm suspecting  CHF, administer Lasix, getting echocardiogram #7. Constipation, continue patient on Colace, patient did have bowel movement today Management plans discussed with the patient, family and they are in agreement.   DRUG ALLERGIES:  Allergies  Allergen Reactions  . Codeine Hives and Nausea And Vomiting  . Penicillin G Itching and Swelling    Has patient had a PCN reaction causing immediate rash, facial/tongue/throat swelling, SOB or lightheadedness with hypotension: Yes Has patient had a PCN reaction causing severe  rash involving mucus membranes or skin necrosis: No Has patient had a PCN reaction that required hospitalization No Has patient had a PCN reaction occurring within the last 10 years: No If all of the above answers are "NO", then may proceed with Cephalosporin use.  . Zithromax [Azithromycin] Other (See Comments)    z pack causes hallucinations per pt   . Prednisone Palpitations and Other (See Comments)    Patient states this medications made her hallucinate.    CODE STATUS:     Code Status Orders        Start     Ordered   07/27/15 1807  Full code   Continuous     07/27/15 1806    Code Status History    Date Active Date Inactive Code Status Order ID Comments User Context   06/03/2015  1:42 PM 06/05/2015  5:01 PM Full Code [id number]  Idelle Crouch, MD Inpatient   02/16/2015  9:36 AM 02/18/2015  4:33 PM Full Code [id number]  Fritzi Mandes, MD Inpatient   02/03/2015  2:32 AM 02/05/2015  4:34 PM Full Code [id number]  Lytle Butte, MD ED      TOTAL TIME TAKING CARE OF THIS PATIENT: 35 minutes.    Theodoro Grist M.D on 07/30/2015 at 1:17 PM  Between 7am to 6pm - Pager - [phone]  After 6pm go to [email or web address] - password [email or web address] [facility]  Office  [phone]  CC: Primary care physician; [name], MD

## 2015-07-30 NOTE — Care Management Important Message (Signed)
Important Message  Patient Details  Name: Lauren Bartlett MRN: [id number] Date of Birth: [birth year]   Medicare Important Message Given:  Yes    Beverly Sessions, RN 07/30/2015, 10:45 AM

## 2015-07-30 NOTE — Care Management (Signed)
Patient admitted for acute respiratory failure.  Patient states that she lives at home alone.  Patient states that she drives. Patient states that her only support system is her father.  Denies any financial concerns. Patient obtains her medications from [facility] in [place].  Patient states that she has home O2 through [employer], but that it's only at night.  I have called and spoken with [employer].  Representative states that patient actually has continuous O2 and had portable tanks delivered to her home in March. [facility] to deliver portable tank to bedside for discharge tomorrow.

## 2015-07-30 NOTE — Progress Notes (Signed)
MD notified regarding patient's two episodes of nausea with dry heaving relieved by zofran. Possibly related to patient's complaints of feeling constipated. LBM was last night though patient is having the urge to have another BM this afternoon. MD acknowledged and new order for dulcolax x1 to be ordered.

## 2015-07-31 ENCOUNTER — Inpatient Hospital Stay
Admit: 2015-07-31 | Discharge: 2015-07-31 | Disposition: A | Discharge status: Home / Self Care | Payer: Medicare Other | Attending: Internal Medicine | Admitting: Internal Medicine

## 2015-07-31 ENCOUNTER — Inpatient Hospital Stay: Payer: Medicare Other

## 2015-07-31 DIAGNOSIS — J9611 Chronic respiratory failure with hypoxia: Secondary | ICD-10-CM | POA: Insufficient documentation

## 2015-07-31 DIAGNOSIS — J9621 Acute and chronic respiratory failure with hypoxia: Secondary | ICD-10-CM | POA: Insufficient documentation

## 2015-07-31 DIAGNOSIS — K567 Ileus, unspecified: Secondary | ICD-10-CM | POA: Insufficient documentation

## 2015-07-31 DIAGNOSIS — J9601 Acute respiratory failure with hypoxia: Secondary | ICD-10-CM

## 2015-07-31 DIAGNOSIS — R0902 Hypoxemia: Secondary | ICD-10-CM

## 2015-07-31 DIAGNOSIS — J9602 Acute respiratory failure with hypercapnia: Secondary | ICD-10-CM

## 2015-07-31 DIAGNOSIS — J441 Chronic obstructive pulmonary disease with (acute) exacerbation: Principal | ICD-10-CM

## 2015-07-31 LAB — ECHOCARDIOGRAM COMPLETE
Height: 62 in
WEIGHTICAEL: 3070.4 [oz_av]

## 2015-07-31 LAB — PLATELET COUNT: PLATELETS: 142 10*3/uL — AB (ref 150–440)

## 2015-07-31 MED ORDER — SODIUM CHLORIDE 0.9 % IV SOLN
INTRAVENOUS | Status: DC
  Administered 2015-07-31: 15:00:00 via INTRAVENOUS

## 2015-07-31 NOTE — Progress Notes (Signed)
*  PRELIMINARY RESULTS* Echocardiogram 2D Echocardiogram has been performed.  Laqueta Jean Hege 07/31/2015, 11:06 AM

## 2015-07-31 NOTE — Progress Notes (Signed)
[place] [facility] [place] NAME: Lauren Bartlett    MR#:  [id number]  DATE OF BIRTH:  09-15-1955  SUBJECTIVE:  CHIEF COMPLAINT:   Chief Complaint  Patient presents with  . Respiratory Distress  Patient is 60 year old Caucasian female with past medical history significant for history of COPD, chronic respiratory failure, requiring oxygen at nighttime ,according to patient, however, around the clock per oxygen delivery company , hypertension, hyperlipidemia who presents for hospital with complaints of shortness of breath, cough, greenish sputum production. On arrival to the hospital patient was noted to have hypoxemia but not hypercarbia, labs otherwise were unremarkable. Chest x-ray showed no acute cardiopulmonary abnormality, no pneumonia. She does not feel well today, nausea is an intermittently vomiting, no significant abdominal pain, not able to tolerate diet, feels weak and not willing to go home yet. Had a bowel movement after enema yesterday Review of Systems  Constitutional: Negative for fever, chills and weight loss.  HENT: Negative for congestion.   Eyes: Negative for blurred vision and double vision.  Respiratory: Positive for cough, sputum production, shortness of breath and wheezing.   Cardiovascular: Negative for chest pain, palpitations, orthopnea, leg swelling and PND.  Gastrointestinal: Positive for nausea and vomiting. Negative for abdominal pain, diarrhea, constipation and blood in stool.  Genitourinary: Negative for dysuria, urgency, frequency and hematuria.  Musculoskeletal: Negative for falls.  Neurological: Positive for weakness. Negative for dizziness, tremors, focal weakness and headaches.  Endo/Heme/Allergies: Does not bruise/bleed easily.  Psychiatric/Behavioral: Negative for depression. The patient does not have insomnia.     VITAL SIGNS: Blood pressure 119/58, pulse 97, temperature 97.8 F (36.6 C), temperature source  Oral, resp. rate 19, height 5\' 2"  (1.575 m), weight 87.045 kg (191 lb 14.4 oz), SpO2 99 %.  PHYSICAL EXAMINATION:   GENERAL:  60 y.o.-year-old patient lying in the bed , in moderate discomfort due to nausea, feels weak, sad affect  EYES: Pupils equal, round, reactive to light and accommodation. No scleral icterus. Extraocular muscles intact.  HEENT: Head atraumatic, normocephalic. Oropharynx and nasopharynx clear.  NECK:  Supple, no jugular venous distention. No thyroid enlargement, no tenderness.  LUNGS: Good breath sounds bilaterally , , no wheezing, no rales,rhonchi , no crepitations noted.  CARDIOVASCULAR: S1, S2 normal. 3/6 systolic murmur , radiating to left axilla and left neck area, no rubs, or gallops.  ABDOMEN: Soft, nontender, nondistended. Bowel sounds present. No organomegaly or mass.  EXTREMITIES: No pedal edema, cyanosis, or clubbing.  NEUROLOGIC: Cranial nerves II through XII are intact. Muscle strength 5/5 in all extremities. Sensation intact. Gait not checked.  PSYCHIATRIC: The patient is alert and oriented x 3. engaging in conversation , uncomfortable, weak SKIN: No obvious rash, lesion, or ulcer.   ORDERS/RESULTS REVIEWED:   CBC  Recent Labs Lab 07/27/15 1709 07/28/15 0553 07/31/15 0420  WBC 9.1 5.1  --   HGB 13.0 12.4  --   HCT 40.1 37.3  --   PLT 155 131* 142*  MCV 90.4 89.3  --   MCH 29.4 29.7  --   MCHC 32.5 33.3  --   RDW 14.5 14.0  --   LYMPHSABS 1.5  --   --   MONOABS 0.8  --   --   EOSABS 0.7  --   --   BASOSABS 0.0  --   --    ------------------------------------------------------------------------------------------------------------------  Chemistries   Recent Labs Lab 07/27/15 1709 07/28/15 0553  NA 142 144  K 3.8 4.3  CL 104 108  CO2 30 25  GLUCOSE 108* 126*  BUN 17 17  CREATININE 0.84 0.59  CALCIUM 8.7* 8.6*  AST 20  --   ALT 15  --   ALKPHOS 63  --   BILITOT 0.5  --     ------------------------------------------------------------------------------------------------------------------ estimated creatinine clearance is 77.6 mL/min (by C-G formula based on Cr of 0.59). ------------------------------------------------------------------------------------------------------------------ No results for input(s): TSH, T4TOTAL, T3FREE, THYROIDAB in the last 72 hours.  Invalid input(s): FREET3  Cardiac Enzymes  Recent Labs Lab 07/27/15 1709  TROPONINI 0.03   ------------------------------------------------------------------------------------------------------------------ Invalid input(s): POCBNP ---------------------------------------------------------------------------------------------------------------  RADIOLOGY: Dg Abd 1 View  07/31/2015  CLINICAL DATA:  Nausea, vomiting EXAM: ABDOMEN - 1 VIEW COMPARISON:  11/07/2008 FINDINGS: Gaseous distended small bowel loops are noted mid abdomen suspicious for ileus, enteritis or early bowel obstruction. IMPRESSION: Gaseous distended small bowel loops mid abdomen suspicious for ileus, enteritis or early bowel obstruction. Electronically Signed   By: Lahoma Crocker M.D.   On: 07/31/2015 09:07   US Venous Img Lower Bilateral  07/29/2015  CLINICAL DATA:  Short of breath, swelling, hypoxia intermittently for 2 months. EXAM: BILATERAL LOWER EXTREMITY VENOUS DUPLEX ULTRASOUND TECHNIQUE: Doppler venous assessment of the bilateral lower extremity deep venous system was performed, including characterization of spectral flow, compressibility, and phasicity. COMPARISON:  None. FINDINGS: There is complete compressibility of the bilateral common femoral, femoral, and popliteal veins. Doppler analysis demonstrates respiratory phasicity and augmentation of flow with calf compression. IMPRESSION: No evidence of lower extremity DVT. Electronically Signed   By: Marybelle Killings M.D.   On: 07/29/2015 14:06    EKG:  Orders placed or performed during  the hospital encounter of 07/27/15  . ED EKG  . ED EKG  . EKG 12-Lead  . EKG 12-Lead    ASSESSMENT AND PLAN:  Active Problems:   COPD exacerbation ([place])  #1. Ileus versus small bowel obstruction, partial, patient had a bowel movement today, will place NG tube for low intermittent suction, ask surgical colleagues to see patient in consultation for further recommendations, get KUB tomorrow morning. Place patient nothing by mouth, initiate patient on IV fluids while nothing by mouth #2. Acute on chronic respiratory failure with hypoxia due to COPD exacerbation, ? Diastolic CHF, continue oxygen therapy as needed, now on 2 L of oxygen per nasal cannula, continue duo nebs, inhaled steroids,  patient now refuses levofloxacin or Humibid , both are discontinued, but overall the patient is better clinically. CT angiogram showed no pulmonary embolism, but air trapping consistent with COPD. awaiting for echocardiogram results  #3. COPD exacerbation, as above. We'll continue inhaled steroids, oxygen, nebulizers, improving overall clinically. Weaning patient to room air as tolerated, but patient was on oxygen per oxygen delivery company at home 24/ 7. CT angiogram showed no  PE. Patient refused to levofloxacin and Humibid, thinking that those medications are causing nausea, both discontinued at present #4. Thrombocytopenia, improving #5. Hyperglycemia, hemoglobin A1c was 5.4, no diabetes #6. Generalized weakness, the patient ambulated more than 300 feet around nursing station in the past few days.  #7. Lower extremity swelling, Doppler ultrasound showed no DVT, I'm suspecting  CHF, patient was given 1 dose of Lasix yesterday with good urinary output, total of 4.2 L., Net negative since admission, awaiting for echocardiogram results #7. Constipation, patient was given Colace, had bowel movement today, however, patient's two-way abdominal x-rays revealed ileus versus small bowel obstruction, getting surgical  consultation, placing NG tube, initiating IV fluids Management plans discussed with the patient,  family and they are in agreement.   DRUG ALLERGIES:  Allergies  Allergen Reactions  . Codeine Hives and Nausea And Vomiting  . Penicillin G Itching and Swelling    Has patient had a PCN reaction causing immediate rash, facial/tongue/throat swelling, SOB or lightheadedness with hypotension: Yes Has patient had a PCN reaction causing severe rash involving mucus membranes or skin necrosis: No Has patient had a PCN reaction that required hospitalization No Has patient had a PCN reaction occurring within the last 10 years: No If all of the above answers are "NO", then may proceed with Cephalosporin use.  . Zithromax [Azithromycin] Other (See Comments)    z pack causes hallucinations per pt   . Prednisone Palpitations and Other (See Comments)    Patient states this medications made her hallucinate.    CODE STATUS:     Code Status Orders        Start     Ordered   07/27/15 1807  Full code   Continuous     07/27/15 1806    Code Status History    Date Active Date Inactive Code Status Order ID Comments User Context   06/03/2015  1:42 PM 06/05/2015  5:01 PM Full Code [id number]  Idelle Crouch, MD Inpatient   02/16/2015  9:36 AM 02/18/2015  4:33 PM Full Code [id number]  Fritzi Mandes, MD Inpatient   02/03/2015  2:32 AM 02/05/2015  4:34 PM Full Code [id number]  Lytle Butte, MD ED      TOTAL TIME TAKING CARE OF THIS PATIENT: 40 minutes.    Theodoro Grist M.D on 07/31/2015 at 1:20 PM  Between 7am to 6pm - Pager - [phone]  After 6pm go to [email or web address] - password [email or web address] [facility]  Office  [phone]  CC: Primary care physician; [name], MD

## 2015-07-31 NOTE — Progress Notes (Signed)
Attempted to insert and NG tube as per order, pt refused, MD notified

## 2015-07-31 NOTE — Progress Notes (Signed)
Patient continue to refuse gastric tube, Md notified, soap enema given as per order patient tolerated

## 2015-07-31 NOTE — H&P (Signed)
Lauren Bartlett is an 60 y.o. female.   Chief Complaint: ielus HPI: 60 yr old with multiple medical issues including HTN, and COPD, here for pneumonia and COPD exacerbation. Patient states that yesterday she had some nausea and dry heaves.  She is upset that she has not been allowed to eat.  She states that she feels constipated but denies feeling bloated or current nausea. Patient states that she has been passing flatus and having some BMs with the help of suppositories and enemas.  Patient denies any sputum production, fevers, chills, or dysuria. Patient requesting to get up and ambulate.   Past Medical History  Diagnosis Date  . Congenital absence of one kidney   . History of sciatica   . Depression     treated at [place]  . Hypertension   . hypothyrodism   . COPD (chronic obstructive pulmonary disease) ([place])   . Anxiety   . Emphysema/COPD [facility])     Past Surgical History  Procedure Laterality Date  . Combined hysterectomy abdominal w/ a&p repair / oophorectomy  1996    benign tumor  . Inner ear surgery      bilateral  . Abdominal hysterectomy    . Breast biopsy Right 2011    [facility]< benign    Family History  Problem Relation Age of Onset  . Multiple sclerosis Mother   . Coronary artery disease Father   . Heart disease Brother   . Breast cancer Paternal Aunt    Social History:  reports that she has never smoked. She has never used smokeless tobacco. She reports that she does not drink alcohol or use illicit drugs.  Allergies:  Allergies  Allergen Reactions  . Codeine Hives and Nausea And Vomiting  . Penicillin G Itching and Swelling    Has patient had a PCN reaction causing immediate rash, facial/tongue/throat swelling, SOB or lightheadedness with hypotension: Yes Has patient had a PCN reaction causing severe rash involving mucus membranes or skin necrosis: No Has patient had a PCN reaction that required hospitalization No Has patient had a PCN reaction occurring within  the last 10 years: No If all of the above answers are "NO", then may proceed with Cephalosporin use.  . Zithromax [Azithromycin] Other (See Comments)    z pack causes hallucinations per pt   . Prednisone Palpitations and Other (See Comments)    Patient states this medications made her hallucinate.    Medications Prior to Admission  Medication Sig Dispense Refill  . albuterol (PROVENTIL HFA;VENTOLIN HFA) 108 (90 BASE) MCG/ACT inhaler Inhale 2 puffs into the lungs every 6 (six) hours as needed for wheezing or shortness of breath. WITH A SPACER 1 Inhaler 0  . beclomethasone (QVAR) 40 MCG/ACT inhaler Inhale 2 puffs into the lungs 2 (two) times daily. 1 Inhaler 0  . calcium-vitamin D (OSCAL WITH D) 500-200 MG-UNIT per tablet Take 1 tablet by mouth daily with breakfast. 90 tablet 2  . hydrochlorothiazide (HYDRODIURIL) 25 MG tablet Take 25 mg by mouth daily.    [name] levothyroxine (SYNTHROID, LEVOTHROID) 112 MCG tablet Take 112 mcg by mouth daily before breakfast.    . loratadine-pseudoephedrine (CLARITIN-D 24-HOUR) 10-240 MG 24 hr tablet Take 1 tablet by mouth daily.    [name] losartan (COZAAR) 100 MG tablet Take 1 tablet (100 mg total) by mouth daily. 30 tablet 3  . montelukast (SINGULAIR) 10 MG tablet Take 1 tablet (10 mg total) by mouth at bedtime. 30 tablet 3  . Umeclidinium-Vilanterol (ANORO ELLIPTA) 62.5-25 MCG/INH  AEPB Inhale 1 puff into the lungs daily.    . Vitamin D, Ergocalciferol, (DRISDOL) 50000 units CAPS capsule Take 50,000 Units by mouth every 7 (seven) days. Take on Saturday      Results for orders placed or performed during the hospital encounter of 07/27/15 (from the past 48 hour(s))  Platelet count     Status: Abnormal   Collection Time: 07/31/15  4:20 AM  Result Value Ref Range   Platelets 142 (L) 150 - 440 K/uL   Dg Abd 1 View  07/31/2015  CLINICAL DATA:  Nausea, vomiting EXAM: ABDOMEN - 1 VIEW COMPARISON:  11/07/2008 FINDINGS: Gaseous distended small bowel loops are noted mid  abdomen suspicious for ileus, enteritis or early bowel obstruction. IMPRESSION: Gaseous distended small bowel loops mid abdomen suspicious for ileus, enteritis or early bowel obstruction. Electronically Signed   By: Lahoma Crocker M.D.   On: 07/31/2015 09:07    Review of Systems  Constitutional: Positive for malaise/fatigue. Negative for fever, chills and diaphoresis.  HENT: Negative for congestion and sore throat.   Respiratory: Positive for cough, shortness of breath and wheezing. Negative for hemoptysis and sputum production.   Cardiovascular: Negative for chest pain, palpitations and leg swelling.  Gastrointestinal: Positive for constipation. Negative for heartburn, nausea, vomiting, abdominal pain, diarrhea, blood in stool and melena.  Genitourinary: Negative for dysuria, urgency, frequency and hematuria.  Musculoskeletal: Negative for myalgias and back pain.  Skin: Negative for itching and rash.  Neurological: Positive for weakness. Negative for dizziness, loss of consciousness and headaches.  Psychiatric/Behavioral: The patient is not nervous/anxious.   All other systems reviewed and are negative.   Blood pressure 119/58, pulse 97, temperature 97.8 F (36.6 C), temperature source Oral, resp. rate 19, height 5\' 2"  (1.575 m), weight 191 lb 14.4 oz (87.045 kg), SpO2 99 %. Physical Exam  Vitals reviewed. Constitutional: She is oriented to person, place, and time. She appears well-developed and well-nourished. No distress.  HENT:  Head: Normocephalic and atraumatic.  Right Ear: External ear normal.  Left Ear: External ear normal.  Nose: Nose normal.  Mouth/Throat: Oropharynx is clear and moist. No oropharyngeal exudate.  Eyes: Conjunctivae and EOM are normal. Pupils are equal, round, and reactive to light. No scleral icterus.  Neck: Normal range of motion. Neck supple. No tracheal deviation present.  Cardiovascular: Normal rate, regular rhythm and intact distal pulses.  Exam reveals no  gallop and no friction rub.   Murmur heard. Respiratory: Effort normal. No respiratory distress. She has wheezes. She has no rales.  GI: Soft. Bowel sounds are normal. She exhibits no distension. There is no tenderness. There is no rebound.  Musculoskeletal: Normal range of motion. She exhibits no edema or tenderness.  Neurological: She is alert and oriented to person, place, and time. No cranial nerve deficit.  Skin: Skin is warm and dry. No rash noted. No erythema. No pallor.  Psychiatric: She has a normal mood and affect. Her behavior is normal. Judgment and thought content normal.     Assessment/Plan 60 yr old female with ileus here for COPD exacerbation.  I have personally reviwed her PMHx to include poor COPD controll without having her recommended home Oxygen.  She is improving now.  I have reviewed her laboratory values which are WNL. I have reviewed her xray which showed some dilation in the small bowel but some air and stool in colon.  I have reviewed the radiology read as above.  I discussed with the patient that likely due to  her acute on chronic ileus she has some constipation and slight ileus, since she has been passing flatus, is non-distended and having BMs, will start her on clear liquids.  Patient understands to stop if she feels nasueated.    Hubbard Robinson, MD 07/31/2015, 6:58 PM

## 2015-07-31 NOTE — Progress Notes (Signed)
Patient ambulate in hallway tolerated

## 2015-07-31 NOTE — Progress Notes (Signed)
Medium BM post enema early in shift; continues to have forceful dry heaving post zofran and compazine; Barbaraann Faster, RN 07/31/2015 5:57 AM

## 2015-08-01 ENCOUNTER — Inpatient Hospital Stay: Payer: Medicare Other

## 2015-08-01 ENCOUNTER — Other Ambulatory Visit: Payer: Self-pay | Admitting: Internal Medicine

## 2015-08-01 MED ORDER — IPRATROPIUM-ALBUTEROL 0.5-2.5 (3) MG/3ML IN SOLN: 3.0000 mL | RESPIRATORY_TRACT | Status: DC

## 2015-08-01 NOTE — Progress Notes (Signed)
Diet advanced to solid food/ tolerated well/ no c/o abd pain/ BM 5/2/ continues to pass gas today/ ambulated around nurses station x3 times/ tolerated well / MD paged to make aware/ ok to discharge pt home with 02

## 2015-08-01 NOTE — Care Management Important Message (Signed)
Important Message  Patient Details  Name: Lauren Bartlett MRN: [id number] Date of Birth: 1956/03/01   Medicare Important Message Given:  Yes    [name] 08/01/2015, 2:45 PM

## 2015-08-01 NOTE — Progress Notes (Signed)
Discharge instructions explained to pt/ verbalized an understanding/ iv and tele removed/transpored off unit via wheelchiar with 02

## 2015-08-01 NOTE — Progress Notes (Signed)
CC: Ileus Subjective: Patient admitted admitted with a COPD exacerbation and subsequently has experienced an ileus. Today she is passing gas she had a large bowel movement yet that yesterday with the aid of a fleets enema. She has no abdominal pain at this point denies fevers or chills  Objective: Vital signs in last 24 hours: Temp:  [98.3 F (36.8 C)-98.4 F (36.9 C)] 98.3 F (36.8 C) (05/03 0735) Pulse Rate:  [93-107] 93 (05/03 0735) Resp:  [20] 20 (05/03 0735) BP: (106-121)/(59-62) 121/60 mmHg (05/03 0735) SpO2:  [93 %-98 %] 93 % (05/03 0735) Last BM Date: 07/31/15  Intake/Output from previous day: 05/02 0701 - 05/03 0700 In: 1381 [I.V.:1381] Out: 1450 [Urine:1450] Intake/Output this shift: Total I/O In: 240 [P.O.:240] Out: 150 [Urine:150]  Physical exam:  Abdomen is distended tympanitic nontender Awake alert oriented Edema Of the lower extremities but nontender calves  Lab Results: CBC   Recent Labs  07/31/15 0420  PLT 142*   BMET No results for input(s): NA, K, CL, CO2, GLUCOSE, BUN, CREATININE, CALCIUM in the last 72 hours. PT/INR No results for input(s): LABPROT, INR in the last 72 hours. ABG No results for input(s): PHART, HCO3 in the last 72 hours.  Invalid input(s): PCO2, PO2  Studies/Results: Dg Abd 1 View  08/01/2015  CLINICAL DATA:  Ileus, COPD, congenital absence of 1 kidney, hypertension EXAM: ABDOMEN - 1 VIEW COMPARISON:  07/31/2015 FINDINGS: Scattered gas and stool within nondistended colon. Small bowel gas pattern normal. No bowel dilatation or bowel wall thickening. Bones demineralized with scattered degenerative changes of the thoracolumbar spine and levoconvex scoliosis. Mild degenerative changes of both hip joints. No urinary tract calcification. IMPRESSION: Normal bowel gas pattern. Electronically Signed   By: Lavonia Dana M.D.   On: 08/01/2015 08:15   Dg Abd 1 View  07/31/2015  CLINICAL DATA:  Nausea, vomiting EXAM: ABDOMEN - 1 VIEW  COMPARISON:  11/07/2008 FINDINGS: Gaseous distended small bowel loops are noted mid abdomen suspicious for ileus, enteritis or early bowel obstruction. IMPRESSION: Gaseous distended small bowel loops mid abdomen suspicious for ileus, enteritis or early bowel obstruction. Electronically Signed   By: Lahoma Crocker M.D.   On: 07/31/2015 09:07    Anti-infectives: Anti-infectives    Start     Dose/Rate Route Frequency Ordered Stop   07/28/15 1800  levofloxacin (LEVAQUIN) tablet 500 mg  Status:  Discontinued     500 mg Oral Daily after supper 07/27/15 1946 07/31/15 0843   07/28/15 1000  levofloxacin (LEVAQUIN) tablet 500 mg  Status:  Discontinued     500 mg Oral Daily 07/27/15 1830 07/27/15 1946   07/27/15 1830  azithromycin (ZITHROMAX) 500 mg in dextrose 5 % 250 mL IVPB  Status:  Discontinued     500 mg 250 mL/hr over 60 Minutes Intravenous Every 24 hours 07/27/15 1827 07/27/15 1830   07/27/15 1800  levofloxacin (LEVAQUIN) IVPB 750 mg     750 mg 100 mL/hr over 90 Minutes Intravenous  Once 07/27/15 1757 07/27/15 2001      Assessment/Plan:  Patient doing quite well and is passing gas at this point I see no surgical needs and we'll sign off.  Florene Glen, MD, FACS  08/01/2015

## 2015-08-01 NOTE — Discharge Summary (Signed)
[facility] NAME: Lauren Bartlett    MR#:  [id number]  DATE OF BIRTH:  09-30-1955  DATE OF ADMISSION:  07/27/2015 ADMITTING PHYSICIAN: Lytle Butte, MD  DATE OF DISCHARGE: No discharge date for patient encounter.  PRIMARY CARE PHYSICIAN: [name], MD     ADMISSION DIAGNOSIS:  COPD with exacerbation ([place]) [J44.1] Acute respiratory failure with hypoxia and hypercapnia (HCC) [J96.01, J96.02]  DISCHARGE DIAGNOSIS:  Active Problems:   COPD exacerbation (HCC)   Acute respiratory failure with hypoxia and hypercapnia (HCC)   Hypoxia   Ileus ([place])   SECONDARY DIAGNOSIS:   Past Medical History  Diagnosis Date  . Congenital absence of one kidney   . History of sciatica   . Depression     treated at [place]  . Hypertension   . hypothyrodism   . COPD (chronic obstructive pulmonary disease) ([place])   . Anxiety   . Emphysema/COPD ([place])     .pro HOSPITAL COURSE:  Patient is 60 year old Caucasian female with past medical history significant for history of COPD, chronic respiratory failure, requiring oxygen at nighttime ,according to patient, however, around the clock per oxygen delivery company , hypertension, hyperlipidemia who presented for hospital with complaints of shortness of breath, cough, greenish sputum production. On arrival to the hospital patient was noted to have hypoxemia but not hypercarbia, labs otherwise were unremarkable. Chest x-ray showed no acute cardiopulmonary abnormality, no pneumonia. Patient was initiated on antibiotic therapy, nebulizers, inhalers and improved clinically. While in the hospital. However, she developed nausea and intermittent vomiting, no significant abdominal pain, underwent x-ray of her abdomen on 07/31/2015 revealing gaseous distention of small bowel loops in mid abdomen suspicious for ileus, enteritis or early bowel obstruction. Patient had an enema to relieve her constipation,  Repeated KUB on 08/01/2015 revealed normal gas pattern, no obstruction. Patient was initiated on diet, which was advanced to solid diet by the day of discharge, she was able to tolerate this diet well with no nausea. It was felt that patient is stable to be discharged home Discussion by problem: #1. Ileus , resolved by now, patient was managed conservatively, she refused it NG tube placement. However, an enema was given to relieve patient's constipation and her diet was advanced with good results. Appreciate surgical input. Patient is to continue stool softeners and stool stimulants at home #2. Acute on chronic respiratory failure with hypoxia due to COPD exacerbation,  Acute Diastolic CHF, continue oxygen therapy as needed, now on 2 L of oxygen per nasal cannula, which is patient's baseline, continue duo nebs, inhaled steroids, patient refused levofloxacin or Humibid, , however, even after a few days of therapy the patient has  clinically improved.  CT angiogram showed no pulmonary embolism, but air trapping consistent with COPD.  Echocardiogram was performed and it was unremarkable. The patient is to follow-up with primary care physician for further recommendations  #3. COPD exacerbation, as above. The patient is to continue inhaled steroids, oxygen, nebulizers, improved clinically, although patient's hypoxia, shortness of breath could have been related to ileus and bowel distention. Weaning patient to room air as tolerated, but patient was on oxygen per oxygen delivery company at home 24/ 7. CT angiogram showed no PE. Patient refused levofloxacin and Humibid,  both discontinued at present, patient is otherwise asymptomatic #4. Thrombocytopenia, improved, follow-up as outpatient #5. Hyperglycemia, hemoglobin A1c was 5.4, no diabetes #6. Generalized weakness, the patient ambulated more than 300 feet around nursing  station in the past few days. There was no significant shortness of breath or discomfort.   #7. Lower extremity swelling, Doppler ultrasound showed no DVT, there was a suspicion of acute diastolic CHF, patient was given 1 dose of Lasix while in the hospital with good urinary output, total of 3.7  L. negative since admission with improved respiratory status,  echocardiogram was unremarkable, normal ejection fraction, unremarkable valvular excursions.  #7. Constipation, patient was given Colace, enema, with improvement, patient would benefit from Colace, senna as outpatient continuously DISCHARGE CONDITIONS:   Stable  CONSULTS OBTAINED:  Treatment Team:  Lytle Butte, MD Hubbard Robinson, MD  DRUG ALLERGIES:   Allergies  Allergen Reactions  . Codeine Hives and Nausea And Vomiting  . Penicillin G Itching and Swelling    Has patient had a PCN reaction causing immediate rash, facial/tongue/throat swelling, SOB or lightheadedness with hypotension: Yes Has patient had a PCN reaction causing severe rash involving mucus membranes or skin necrosis: No Has patient had a PCN reaction that required hospitalization No Has patient had a PCN reaction occurring within the last 10 years: No If all of the above answers are "NO", then may proceed with Cephalosporin use.  . Zithromax [Azithromycin] Other (See Comments)    z pack causes hallucinations per pt   . Prednisone Palpitations and Other (See Comments)    Patient states this medications made her hallucinate.    DISCHARGE MEDICATIONS:   Current Discharge Medication List    CONTINUE these medications which have NOT CHANGED   Details  albuterol (PROVENTIL HFA;VENTOLIN HFA) 108 (90 BASE) MCG/ACT inhaler Inhale 2 puffs into the lungs every 6 (six) hours as needed for wheezing or shortness of breath. WITH A SPACER Qty: 1 Inhaler, Refills: 0    beclomethasone (QVAR) 40 MCG/ACT inhaler Inhale 2 puffs into the lungs 2 (two) times daily. Qty: 1 Inhaler, Refills: 0    calcium-vitamin D (OSCAL WITH D) 500-200 MG-UNIT per tablet Take 1  tablet by mouth daily with breakfast. Qty: 90 tablet, Refills: 2    hydrochlorothiazide (HYDRODIURIL) 25 MG tablet Take 25 mg by mouth daily.    levothyroxine (SYNTHROID, LEVOTHROID) 112 MCG tablet Take 112 mcg by mouth daily before breakfast.    loratadine-pseudoephedrine (CLARITIN-D 24-HOUR) 10-240 MG 24 hr tablet Take 1 tablet by mouth daily.    losartan (COZAAR) 100 MG tablet Take 1 tablet (100 mg total) by mouth daily. Qty: 30 tablet, Refills: 3    montelukast (SINGULAIR) 10 MG tablet Take 1 tablet (10 mg total) by mouth at bedtime. Qty: 30 tablet, Refills: 3    Umeclidinium-Vilanterol (ANORO ELLIPTA) 62.5-25 MCG/INH AEPB Inhale 1 puff into the lungs daily.    Vitamin D, Ergocalciferol, (DRISDOL) 50000 units CAPS capsule Take 50,000 Units by mouth every 7 (seven) days. Take on Saturday         DISCHARGE INSTRUCTIONS:    Patient is to follow-up with primary care physician as outpatient  If you experience worsening of your admission symptoms, develop shortness of breath, life threatening emergency, suicidal or homicidal thoughts you must seek medical attention immediately by calling 911 or calling your MD immediately  if symptoms less severe.  You Must read complete instructions/literature along with all the possible adverse reactions/side effects for all the Medicines you take and that have been prescribed to you. Take any new Medicines after you have completely understood and accept all the possible adverse reactions/side effects.   Please note  You were cared for by a  hospitalist during your hospital stay. If you have any questions about your discharge medications or the care you received while you were in the hospital after you are discharged, you can call the unit and asked to speak with the hospitalist on call if the hospitalist that took care of you is not available. Once you are discharged, your primary care physician will handle any further medical issues. Please note  that NO REFILLS for any discharge medications will be authorized once you are discharged, as it is imperative that you return to your primary care physician (or establish a relationship with a primary care physician if you do not have one) for your aftercare needs so that they can reassess your need for medications and monitor your lab values.    Today   CHIEF COMPLAINT:   Chief Complaint  Patient presents with  . Respiratory Distress    HISTORY OF PRESENT ILLNESS:  Lauren Bartlett  is a 60 y.o. female with a known history of COPD, chronic respiratory failure, requiring oxygen at nighttime ,according to patient, however, around the clock per oxygen delivery company , hypertension, hyperlipidemia who presented for hospital with complaints of shortness of breath, cough, greenish sputum production. On arrival to the hospital patient was noted to have hypoxemia but not hypercarbia, labs otherwise were unremarkable. Chest x-ray showed no acute cardiopulmonary abnormality, no pneumonia. Patient was initiated on antibiotic therapy, nebulizers, inhalers and improved clinically. While in the hospital. However, she developed nausea and intermittent vomiting, no significant abdominal pain, underwent x-ray of her abdomen on 07/31/2015 revealing gaseous distention of small bowel loops in mid abdomen suspicious for ileus, enteritis or early bowel obstruction. Patient had an enema to relieve her constipation, Repeated KUB on 08/01/2015 revealed normal gas pattern, no obstruction. Patient was initiated on diet, which was advanced to solid diet by the day of discharge, she was able to tolerate this diet well with no nausea. It was felt that patient is stable to be discharged home Discussion by problem: #1. Ileus , resolved by now, patient was managed conservatively, she refused it NG tube placement. However, an enema was given to relieve patient's constipation and her diet was advanced with good results. Appreciate  surgical input. Patient is to continue stool softeners and stool stimulants at home #2. Acute on chronic respiratory failure with hypoxia due to COPD exacerbation,  Acute Diastolic CHF, continue oxygen therapy as needed, now on 2 L of oxygen per nasal cannula, which is patient's baseline, continue duo nebs, inhaled steroids, patient refused levofloxacin or Humibid, , however, even after a few days of therapy the patient has  clinically improved.  CT angiogram showed no pulmonary embolism, but air trapping consistent with COPD.  Echocardiogram was performed and it was unremarkable. The patient is to follow-up with primary care physician for further recommendations  #3. COPD exacerbation, as above. The patient is to continue inhaled steroids, oxygen, nebulizers, improved clinically, although patient's hypoxia, shortness of breath could have been related to ileus and bowel distention. Weaning patient to room air as tolerated, but patient was on oxygen per oxygen delivery company at home 24/ 7. CT angiogram showed no PE. Patient refused levofloxacin and Humibid,  both discontinued at present, patient is otherwise asymptomatic #4. Thrombocytopenia, improved, follow-up as outpatient #5. Hyperglycemia, hemoglobin A1c was 5.4, no diabetes #6. Generalized weakness, the patient ambulated more than 300 feet around nursing station in the past few days. There was no significant shortness of breath or discomfort.  #7. Lower extremity  swelling, Doppler ultrasound showed no DVT, there was a suspicion of acute diastolic CHF, patient was given 1 dose of Lasix while in the hospital with good urinary output, total of 3.7  L. negative since admission with improved respiratory status,  echocardiogram was unremarkable, normal ejection fraction, unremarkable valvular excursions.  #7. Constipation, patient was given Colace, enema, with improvement, patient would benefit from Colace, senna as outpatient continuously    VITAL  SIGNS:  Blood pressure 128/72, pulse 103, temperature 98 F (36.7 C), temperature source Oral, resp. rate 22, height 5\' 2"  (1.575 m), weight 87.045 kg (191 lb 14.4 oz), SpO2 100 %.  I/O:   Intake/Output Summary (Last 24 hours) at 08/01/15 1408 Last data filed at 08/01/15 0900  Gross per 24 hour  Intake   1621 ml  Output   1100 ml  Net    521 ml    PHYSICAL EXAMINATION:  GENERAL:  60 y.o.-year-old patient lying in the bed with no acute distress.  EYES: Pupils equal, round, reactive to light and accommodation. No scleral icterus. Extraocular muscles intact.  HEENT: Head atraumatic, normocephalic. Oropharynx and nasopharynx clear.  NECK:  Supple, no jugular venous distention. No thyroid enlargement, no tenderness.  LUNGS: Normal breath sounds bilaterally, no wheezing, rales,rhonchi or crepitation. No use of accessory muscles of respiration.  CARDIOVASCULAR: S1, S2 normal. No murmurs, rubs, or gallops.  ABDOMEN: Soft, non-tender, non-distended. Bowel sounds present. No organomegaly or mass.  EXTREMITIES: No pedal edema, cyanosis, or clubbing.  NEUROLOGIC: Cranial nerves II through XII are intact. Muscle strength 5/5 in all extremities. Sensation intact. Gait not checked.  PSYCHIATRIC: The patient is alert and oriented x 3.  SKIN: No obvious rash, lesion, or ulcer.   DATA REVIEW:   CBC  Recent Labs Lab 07/28/15 0553 07/31/15 0420  WBC 5.1  --   HGB 12.4  --   HCT 37.3  --   PLT 131* 142*    Chemistries   Recent Labs Lab 07/27/15 1709 07/28/15 0553  NA 142 144  K 3.8 4.3  CL 104 108  CO2 30 25  GLUCOSE 108* 126*  BUN 17 17  CREATININE 0.84 0.59  CALCIUM 8.7* 8.6*  AST 20  --   ALT 15  --   ALKPHOS 63  --   BILITOT 0.5  --     Cardiac Enzymes  Recent Labs Lab 07/27/15 1709  TROPONINI 0.03    Microbiology Results  Results for orders placed or performed in visit on 09/11/14  Fecal occult blood, imunochemical     Status: None   Collection Time: 09/11/14   3:21 PM  Result Value Ref Range Status   Fecal Occult Bld Negative Negative Final    RADIOLOGY:  Dg Abd 1 View  08/01/2015  CLINICAL DATA:  Ileus, COPD, congenital absence of 1 kidney, hypertension EXAM: ABDOMEN - 1 VIEW COMPARISON:  07/31/2015 FINDINGS: Scattered gas and stool within nondistended colon. Small bowel gas pattern normal. No bowel dilatation or bowel wall thickening. Bones demineralized with scattered degenerative changes of the thoracolumbar spine and levoconvex scoliosis. Mild degenerative changes of both hip joints. No urinary tract calcification. IMPRESSION: Normal bowel gas pattern. Electronically Signed   By: Lavonia Dana M.D.   On: 08/01/2015 08:15   Dg Abd 1 View  07/31/2015  CLINICAL DATA:  Nausea, vomiting EXAM: ABDOMEN - 1 VIEW COMPARISON:  11/07/2008 FINDINGS: Gaseous distended small bowel loops are noted mid abdomen suspicious for ileus, enteritis or early bowel obstruction. IMPRESSION: Gaseous distended  small bowel loops mid abdomen suspicious for ileus, enteritis or early bowel obstruction. Electronically Signed   By: Lahoma Crocker M.D.   On: 07/31/2015 09:07    EKG:   Orders placed or performed during the hospital encounter of 07/27/15  . ED EKG  . ED EKG  . EKG 12-Lead  . EKG 12-Lead      Management plans discussed with the patient, family and they are in agreement.  CODE STATUS:     Code Status Orders        Start     Ordered   07/27/15 1807  Full code   Continuous     07/27/15 1806    Code Status History    Date Active Date Inactive Code Status Order ID Comments User Context   06/03/2015  1:42 PM 06/05/2015  5:01 PM Full Code [id number]  Idelle Crouch, MD Inpatient   02/16/2015  9:36 AM 02/18/2015  4:33 PM Full Code [id number]  Fritzi Mandes, MD Inpatient   02/03/2015  2:32 AM 02/05/2015  4:34 PM Full Code [id number]  Lytle Butte, MD ED      TOTAL TIME TAKING CARE OF THIS PATIENT: 40 minutes.    Theodoro Grist M.D on 08/01/2015 at 2:08 PM  Between  7am to 6pm - Pager - [phone]  After 6pm go to [email or web address] - password [email or web address] [facility]  Office  [phone]  CC: Primary care physician; [name], MD

## 2015-08-02 ENCOUNTER — Telehealth: Payer: Self-pay

## 2015-08-02 NOTE — Telephone Encounter (Signed)
Transition Care Management Follow-up Telephone Call   Date discharged? 08/01/15   How have you been since you were released from the hospital? First BM today.  No pain.  Cough has decreased.  No sputum.  No SOB.  O2 set at 2L.  No N/V.  Baseline for lower extremity swelling.   Do you understand why you were in the hospital? YES, I was having trouble breathing.  COPD flare up.   Do you understand the discharge instructions? YES, keep oxygen on at 2L all the time.  Increase activity as tolerated.   Where were you discharged to? HOME.   Items Reviewed:  Medications reviewed: YES, continuing all scheduled home medications as directed.  Allergies reviewed: YES, Codeine, Penicillin G, Zithromax, Prednisone.  Dietary changes reviewed: YES, solid diet, no problems.  Referrals reviewed:  Pulmonary and PCP appointment scheduled.   Functional Questionnaire:   Activities of Daily Living (ADLs):   She states they are independent in the following: Bathing, dressing, meal prep, toileting, self feeding, grooming. States they require assistance with the following: Ambulating (walker in use).   Any transportation issues/concerns?: NO.   Any patient concerns? NO, none at this time.   Confirmed importance and date/time of follow-up visits scheduled YES, appointment scheduled 08/03/15 at 1:30.  Provider Appointment booked with Dr. Derrel Nip (PCP).  Confirmed with patient if condition begins to worsen call PCP or go to the ER.  Patient was given the office number and encouraged to call back with question or concerns.  : YES, patient verbalized understanding.

## 2015-08-02 NOTE — Telephone Encounter (Signed)
Unable to reach patient.  First attempt to follow up with transitional care management.  Left a message to return my call and schedule a follow up appointment with PCP.  Will call again in the morning on 08/03/15.

## 2015-08-03 ENCOUNTER — Encounter: Payer: Self-pay | Admitting: Internal Medicine

## 2015-08-03 ENCOUNTER — Ambulatory Visit (INDEPENDENT_AMBULATORY_CARE_PROVIDER_SITE_OTHER): Payer: Medicare Other | Admitting: Internal Medicine

## 2015-08-03 VITALS — BP 106/60 | HR 83 | Temp 97.8°F | Resp 12 | Ht 62.5 in | Wt 196.2 lb

## 2015-08-03 DIAGNOSIS — I272 Other secondary pulmonary hypertension: Secondary | ICD-10-CM

## 2015-08-03 DIAGNOSIS — J9601 Acute respiratory failure with hypoxia: Secondary | ICD-10-CM | POA: Diagnosis not present

## 2015-08-03 DIAGNOSIS — Z09 Encounter for follow-up examination after completed treatment for conditions other than malignant neoplasm: Secondary | ICD-10-CM

## 2015-08-03 DIAGNOSIS — J441 Chronic obstructive pulmonary disease with (acute) exacerbation: Secondary | ICD-10-CM | POA: Diagnosis not present

## 2015-08-03 DIAGNOSIS — Z5189 Encounter for other specified aftercare: Secondary | ICD-10-CM

## 2015-08-03 DIAGNOSIS — E669 Obesity, unspecified: Secondary | ICD-10-CM

## 2015-08-03 DIAGNOSIS — J9611 Chronic respiratory failure with hypoxia: Secondary | ICD-10-CM

## 2015-08-03 DIAGNOSIS — D696 Thrombocytopenia, unspecified: Secondary | ICD-10-CM

## 2015-08-03 DIAGNOSIS — I340 Nonrheumatic mitral (valve) insufficiency: Secondary | ICD-10-CM

## 2015-08-03 DIAGNOSIS — K5900 Constipation, unspecified: Secondary | ICD-10-CM

## 2015-08-03 MED ORDER — POLYETHYLENE GLYCOL 3350 17 GM/SCOOP PO POWD: 17.0000 g | Freq: Two times a day (BID) | ORAL | Status: AC | PRN

## 2015-08-03 MED ORDER — ALBUTEROL SULFATE (2.5 MG/3ML) 0.083% IN NEBU
2.5000 mg | INHALATION_SOLUTION | Freq: Once | RESPIRATORY_TRACT | Status: AC
  Administered 2015-08-03: 2.5 mg via RESPIRATORY_TRACT

## 2015-08-03 NOTE — Progress Notes (Signed)
Subjective:  Patient ID: Lauren Bartlett, female    DOB: June 09, 1955  Age: 60 y.o. MRN: [id number]  CC: The primary encounter diagnosis was COPD exacerbation ([place]). Diagnoses of Acute respiratory failure with hypoxia ([place]), Pulmonary hypertension ([place]), Obesity (BMI 30-39.9), Severe mitral regurgitation by prior echocardiogram, Chronic respiratory failure with hypoxia ([place]), Constipation, unspecified constipation type, Hospital discharge follow-up, and Thrombocytopenia ([place]) were also pertinent to this visit.  HPI Lauren Bartlett presents for hospital follow up . Admitted 4.28 to [facility] with acute hypoxic nonhypercarbic  Respiratory  failure  2/2 copd exacerbation. Patient was treated with nebulizers, inhaled and systemic  steroids and antibiotics.   Her hospitalization was  complicated by abdominal pain with nausea and dry heaves which was found to be secondary to ileus.  The The ileus was resolved with bowel rest (NPO ) and finally a  fleets enema , and she was discharged home on May 3 with supplemental oxygen    All Notes from hospitalists and specialists as well as  imaging studies were  reviewed;  Including a CT angiogram of chest  And u/s which were done to rule  out PE  and DVT , and 2 D ECHO to rule out systolic dysfunction and cardiomyopathy . Images were poor but EF was deemed normal at 60 to 65%  ,    Still constipated , using colace. No nausea    Breathing better.  Has been Wearing oxygen 24/7 as advised by the hospitalists. .  Has a borrowed pulse ox meter but has not checked her room air saturations after ambulation.   Using albuterol nebs q 6 hrs, refused levaquin and steroid taper.  Denies thrush, chest pain ,  sats are 99 o n 2 l     Outpatient Prescriptions Prior to Visit  Medication Sig Dispense Refill  . albuterol (PROVENTIL HFA;VENTOLIN HFA) 108 (90 BASE) MCG/ACT inhaler Inhale 2 puffs into the lungs every 6 (six) hours as needed for wheezing or shortness of breath. WITH A SPACER 1  Inhaler 0  . beclomethasone (QVAR) 40 MCG/ACT inhaler Inhale 2 puffs into the lungs 2 (two) times daily. 1 Inhaler 0  . calcium-vitamin D (OSCAL WITH D) 500-200 MG-UNIT per tablet Take 1 tablet by mouth daily with breakfast. 90 tablet 2  . hydrochlorothiazide (HYDRODIURIL) 25 MG tablet Take 25 mg by mouth daily.    [name] ipratropium-albuterol (DUONEB) 0.5-2.5 (3) MG/3ML SOLN Take 3 mLs by nebulization every 4 (four) hours. 360 mL 5  . levothyroxine (SYNTHROID, LEVOTHROID) 112 MCG tablet Take 112 mcg by mouth daily before breakfast.    . levothyroxine (SYNTHROID, LEVOTHROID) 112 MCG tablet TAKE 1 TABLET BY MOUTH EVERY DAY 90 tablet 2  . loratadine-pseudoephedrine (CLARITIN-D 24-HOUR) 10-240 MG 24 hr tablet Take 1 tablet by mouth daily.    [name] losartan (COZAAR) 100 MG tablet Take 1 tablet (100 mg total) by mouth daily. 30 tablet 3  . montelukast (SINGULAIR) 10 MG tablet Take 1 tablet (10 mg total) by mouth at bedtime. 30 tablet 3  . Umeclidinium-Vilanterol (ANORO ELLIPTA) 62.5-25 MCG/INH AEPB Inhale 1 puff into the lungs daily.    . Vitamin D, Ergocalciferol, (DRISDOL) 50000 units CAPS capsule Take 50,000 Units by mouth every 7 (seven) days. Take on Saturday     No facility-administered medications prior to visit.    Review of Systems;  Patient denies headache, fevers, malaise, unintentional weight loss, skin rash, eye pain, sinus congestion and sinus pain, sore throat, dysphagia,  hemoptysis , cough, dyspnea, wheezing,  chest pain, palpitations, orthopnea, edema, abdominal pain, nausea, melena, diarrhea, constipation, flank pain, dysuria, hematuria, urinary  Frequency, nocturia, numbness, tingling, seizures,  Focal weakness, Loss of consciousness,  Tremor, insomnia, depression, anxiety, and suicidal ideation.      Objective:  BP 106/60 mmHg  Pulse 83  Temp(Src) 97.8 F (36.6 C) (Oral)  Resp 12  Ht 5' 2.5" (1.588 m)  Wt 196 lb 4 oz (89.018 kg)  BMI 35.30 kg/m2  SpO2 99%  BP Readings from Last  3 Encounters:  08/03/15 106/60  08/01/15 128/72  07/27/15 111/65    Wt Readings from Last 3 Encounters:  08/03/15 196 lb 4 oz (89.018 kg)  07/27/15 191 lb 14.4 oz (87.045 kg)  07/27/15 195 lb (88.451 kg)    General appearance: alert, cooperative and appears stated age Ears: normal TM's and external ear canals both ears Throat: lips, mucosa, and tongue normal; teeth and gums normal Neck: no adenopathy, no carotid bruit, supple, symmetrical, trachea midline and thyroid not enlarged, symmetric, no tenderness/mass/nodules Back: symmetric, no curvature. ROM normal. No CVA tenderness. Lungs: clear to auscultation bilaterally Heart: regular rate and rhythm, S1, S2 normal, no murmur, click, rub or gallop Abdomen: soft, non-tender; bowel sounds normal; no masses,  no organomegaly Pulses: 2+ and symmetric Skin: Skin color, texture, turgor normal. No rashes or lesions Lymph nodes: Cervical, supraclavicular, and axillary nodes normal.  Lab Results  Component Value Date   HGBA1C 5.4 07/28/2015    Lab Results  Component Value Date   CREATININE 0.59 07/28/2015   CREATININE 0.84 07/27/2015   CREATININE 0.85 06/04/2015    Lab Results  Component Value Date   WBC 5.1 07/28/2015   HGB 12.4 07/28/2015   HCT 37.3 07/28/2015   PLT 142* 07/31/2015   GLUCOSE 126* 07/28/2015   CHOL 173 03/14/2015   TRIG 85.0 03/14/2015   HDL 57.10 03/14/2015   LDLCALC 99 03/14/2015   ALT 15 07/27/2015   AST 20 07/27/2015   NA 144 07/28/2015   K 4.3 07/28/2015   CL 108 07/28/2015   CREATININE 0.59 07/28/2015   BUN 17 07/28/2015   CO2 25 07/28/2015   TSH 1.02 03/14/2015   INR 1.1 02/14/2013   HGBA1C 5.4 07/28/2015    Dg Abd 1 View  08/01/2015  CLINICAL DATA:  Ileus, COPD, congenital absence of 1 kidney, hypertension EXAM: ABDOMEN - 1 VIEW COMPARISON:  07/31/2015 FINDINGS: Scattered gas and stool within nondistended colon. Small bowel gas pattern normal. No bowel dilatation or bowel wall thickening.  Bones demineralized with scattered degenerative changes of the thoracolumbar spine and levoconvex scoliosis. Mild degenerative changes of both hip joints. No urinary tract calcification. IMPRESSION: Normal bowel gas pattern. Electronically Signed   By: Lavonia Dana M.D.   On: 08/01/2015 08:15   Dg Abd 1 View  07/31/2015  CLINICAL DATA:  Nausea, vomiting EXAM: ABDOMEN - 1 VIEW COMPARISON:  11/07/2008 FINDINGS: Gaseous distended small bowel loops are noted mid abdomen suspicious for ileus, enteritis or early bowel obstruction. IMPRESSION: Gaseous distended small bowel loops mid abdomen suspicious for ileus, enteritis or early bowel obstruction. Electronically Signed   By: Lahoma Crocker M.D.   On: 07/31/2015 09:07    Assessment & Plan:   Problem List Items Addressed This Visit    Obesity (BMI 30-39.9)    I have congratulated her in reduction of BMI and encouraged  Continued weight loss with goal of 10% of body weigh over the next 6 months using a low glycemic index diet and  regular exercise a minimum of 5 days per week.        Severe mitral regurgitation by prior echocardiogram    Mild and Stable per notes from Dr Clayborn Bigness Nov 2016.    ECHO 3 / 2016.    6 month follow up was advised.          Pulmonary hypertension ([place])    tentatively diagnosed  Dr Raul Del.  Etiology likely mitral regurgitation vs idiopathic.[name] Also has OSA by prior sleep study .   Last seen by him on April 10.  No further workup planned  managed by Dr Clayborn Bigness.  Continue supplemental 02       Acute respiratory failure with hypoxia ([place])    She appears clinically improved but her ambulatory sats drop to 81% on room air after < 10 seconds.  She was advised to continue use of oxygen around the clock. Imaging studies during [facility] hospitalization ruled out pneumonia, and PE.  Her continued desaturations are likely due to pulmonary hypertension.  Follow up on e month       Chronic respiratory failure with hypoxia ([facility])     Multifactorial,  With sleep apnea, pulmonary hypertension and COPD (non tobacco user) all contributing,  She will likely need 02 24/7 indefinitely.       Constipation    Recent ileus discussed,  Bowel regimen discussed.       Hospital discharge follow-up    Patient is stable post discharge and has no new issues or questions about discharge plans at the visit today for hospital follow up.  I have reviewed the records from the hospital admission in detail with patient today.  She will  Continue 24 hour use of supplemental 02, and need to follow up with Dr Raul Del,  Dr Clayborn Bigness  And return here in one month      Thrombocytopenia ([place])    Mild, stable and improving by review of in house labs.        COPD exacerbation ([place]) - Primary   Relevant Medications   albuterol (PROVENTIL) (2.5 MG/3ML) 0.083% nebulizer solution 2.5 mg (Completed)      I am having Ms. Weigel start on polyethylene glycol powder. I am also having her maintain her albuterol, umeclidinium-vilanterol, calcium-vitamin D, loratadine-pseudoephedrine, beclomethasone, losartan, montelukast, hydrochlorothiazide, levothyroxine, Vitamin D (Ergocalciferol), ipratropium-albuterol, and levothyroxine. We administered albuterol.  Meds ordered this encounter  Medications  . polyethylene glycol powder (GLYCOLAX/MIRALAX) powder    Sig: Take 17 g by mouth 2 (two) times daily as needed.    Dispense:  3350 g    Refill:  1  . albuterol (PROVENTIL) (2.5 MG/3ML) 0.083% nebulizer solution 2.5 mg    Sig:     There are no discontinued medications.  Follow-up: Return in about 1 month (around 09/03/2015).   [name], MD

## 2015-08-03 NOTE — Progress Notes (Signed)
Pre-visit discussion using our clinic review tool. No additional management support is needed unless otherwise documented below in the visit note.  

## 2015-08-03 NOTE — Patient Instructions (Signed)
I recommend starting miralax to use daily or twice daily along with your stool softener daily to treat your constipation  You can use Sennakot or Dulcolax not more than every other day to move your bowels IF NEEDED     You should continue to use 2L oxygen  At night and whenever your 0 2 level is < 90%   Return in one month

## 2015-08-05 DIAGNOSIS — Z09 Encounter for follow-up examination after completed treatment for conditions other than malignant neoplasm: Secondary | ICD-10-CM | POA: Insufficient documentation

## 2015-08-05 DIAGNOSIS — D696 Thrombocytopenia, unspecified: Secondary | ICD-10-CM | POA: Insufficient documentation

## 2015-08-05 DIAGNOSIS — K5900 Constipation, unspecified: Secondary | ICD-10-CM | POA: Insufficient documentation

## 2015-08-05 NOTE — Assessment & Plan Note (Signed)
Mild, stable and improving by review of in house labs.

## 2015-08-05 NOTE — Assessment & Plan Note (Signed)
Patient is stable post discharge and has no new issues or questions about discharge plans at the visit today for hospital follow up.  I have reviewed the records from the hospital admission in detail with patient today.  She will  Continue 24 hour use of supplemental 02, and need to follow up with Dr Raul Del,  Dr Clayborn Bigness  And return here in one month

## 2015-08-05 NOTE — Assessment & Plan Note (Addendum)
She appears clinically improved but her ambulatory sats drop to 81% on room air after < 10 seconds.  She was advised to continue use of oxygen around the clock. Imaging studies during [facility] hospitalization ruled out pneumonia, and PE.  Her continued desaturations are likely due to pulmonary hypertension.  Follow up on e month

## 2015-08-05 NOTE — Assessment & Plan Note (Addendum)
tentatively diagnosed  Dr Raul Del.  Etiology likely mitral regurgitation vs idiopathic.[name] Also has OSA by prior sleep study .   Last seen by him on April 10.  No further workup planned  managed by Dr Clayborn Bigness.  Continue supplemental 02

## 2015-08-05 NOTE — Assessment & Plan Note (Signed)
Multifactorial,  With sleep apnea, pulmonary hypertension and COPD (non tobacco user) all contributing,  She will likely need 02 24/7 indefinitely.

## 2015-08-05 NOTE — Assessment & Plan Note (Signed)
Recent ileus discussed,  Bowel regimen discussed.

## 2015-08-05 NOTE — Assessment & Plan Note (Signed)
Mild and Stable per notes from Dr Clayborn Bigness Nov 2016.    ECHO 3 / 2016.    6 month follow up was advised.

## 2015-08-05 NOTE — Assessment & Plan Note (Signed)
I have congratulated her in reduction of   BMI and encouraged  Continued weight loss with goal of 10% of body weigh over the next 6 months using a low glycemic index diet and regular exercise a minimum of 5 days per week.    

## 2015-08-08 LAB — BLOOD GAS, VENOUS
ACID-BASE EXCESS: 6.6 mmol/L — AB (ref 0.0–3.0)
BICARBONATE: 33.9 meq/L — AB (ref 21.0–28.0)
PH VEN: 7.36 (ref 7.320–7.430)
Patient temperature: 37
pCO2, Ven: 60 mmHg (ref 44.0–60.0)
pO2, Ven: <31 mmHg — ABNORMAL LOW (ref 31.0–45.0)

## 2015-08-25 ENCOUNTER — Emergency Department: Payer: Medicare Other

## 2015-08-25 ENCOUNTER — Emergency Department
Admission: EM | Admit: 2015-08-25 | Discharge: 2015-08-25 | Disposition: A | Discharge status: Home / Self Care | Payer: Medicare Other | Attending: Emergency Medicine | Admitting: Emergency Medicine

## 2015-08-25 ENCOUNTER — Encounter: Payer: Self-pay | Admitting: Emergency Medicine

## 2015-08-25 DIAGNOSIS — I10 Essential (primary) hypertension: Secondary | ICD-10-CM | POA: Insufficient documentation

## 2015-08-25 DIAGNOSIS — F329 Major depressive disorder, single episode, unspecified: Secondary | ICD-10-CM | POA: Diagnosis not present

## 2015-08-25 DIAGNOSIS — E669 Obesity, unspecified: Secondary | ICD-10-CM | POA: Diagnosis not present

## 2015-08-25 DIAGNOSIS — E079 Disorder of thyroid, unspecified: Secondary | ICD-10-CM | POA: Insufficient documentation

## 2015-08-25 DIAGNOSIS — Z79899 Other long term (current) drug therapy: Secondary | ICD-10-CM | POA: Diagnosis not present

## 2015-08-25 DIAGNOSIS — J441 Chronic obstructive pulmonary disease with (acute) exacerbation: Secondary | ICD-10-CM | POA: Diagnosis not present

## 2015-08-25 DIAGNOSIS — R0602 Shortness of breath: Secondary | ICD-10-CM | POA: Diagnosis present

## 2015-08-25 LAB — BASIC METABOLIC PANEL
Anion gap: 6 (ref 5–15)
BUN: 17 mg/dL (ref 6–20)
CALCIUM: 8.3 mg/dL — AB (ref 8.9–10.3)
CO2: 29 mmol/L (ref 22–32)
CREATININE: 0.78 mg/dL (ref 0.44–1.00)
Chloride: 105 mmol/L (ref 101–111)
GFR calc Af Amer: >60 mL/min (ref >60)
GLUCOSE: 98 mg/dL (ref 65–99)
POTASSIUM: 4.3 mmol/L (ref 3.5–5.1)
SODIUM: 140 mmol/L (ref 135–145)

## 2015-08-25 LAB — CBC
HCT: 35.4 % (ref 35.0–47.0)
Hemoglobin: 11.8 g/dL — ABNORMAL LOW (ref 12.0–16.0)
MCH: 29.7 pg (ref 26.0–34.0)
MCHC: 33.4 g/dL (ref 32.0–36.0)
MCV: 88.9 fL (ref 80.0–100.0)
PLATELETS: 152 10*3/uL (ref 150–440)
RBC: 3.99 MIL/uL (ref 3.80–5.20)
RDW: 14.8 % — AB (ref 11.5–14.5)
WBC: 6.5 10*3/uL (ref 3.6–11.0)

## 2015-08-25 LAB — TROPONIN I

## 2015-08-25 MED ORDER — AZITHROMYCIN 500 MG PO TABS: 500.0000 mg | ORAL_TABLET | Freq: Every day | ORAL | Status: AC

## 2015-08-25 MED ORDER — IPRATROPIUM-ALBUTEROL 0.5-2.5 (3) MG/3ML IN SOLN
3.0000 mL | Freq: Once | RESPIRATORY_TRACT | Status: AC
  Administered 2015-08-25: 3 mL via RESPIRATORY_TRACT
  Filled 2015-08-25: qty 3

## 2015-08-25 NOTE — ED Notes (Signed)
Pt has history of COPD. Reports yesterday started feeling some chest tightness. SOB started this morning. Pt reports used home oxygen as needed.

## 2015-08-25 NOTE — ED Notes (Signed)
Patient transported to X-ray 

## 2015-08-25 NOTE — Discharge Instructions (Signed)

## 2015-08-25 NOTE — ED Notes (Signed)
Using nebulizer without relief

## 2015-08-25 NOTE — ED Provider Notes (Signed)
[facility] Emergency Department Provider Note  ____________________________________________    I have reviewed the triage vital signs and the nursing notes.   HISTORY  Chief Complaint Shortness of Breath    HPI Lauren Bartlett is a 60 y.o. female who presents with complaints ofshortness of breath and cough. She reports a history of COPD. She sees Dr. Raul Del of pulmonology, she uses oxygen at night only and sometimes during the day as needed. She denies chest pain. She reports she has been wheezing and used her nebulizers at home with helped somewhat but she is concerned because she has a dry cough. She saw Dr. Raul Del recently who put her on cough medication. She wants to be sure she doesn't have a pneumonia. She denies recent travel. No calf pain or swelling.     Past Medical History  Diagnosis Date  . Congenital absence of one kidney   . History of sciatica   . Depression     treated at [place]  . Hypertension   . hypothyrodism   . COPD (chronic obstructive pulmonary disease) ([place])   . Anxiety   . Emphysema/COPD [facility])     Patient Active Problem List   Diagnosis Date Noted  . Constipation 08/05/2015  . Hospital discharge follow-up 08/05/2015  . Thrombocytopenia ([place]) 08/05/2015  . Acute respiratory failure with hypoxia ([place])   . Chronic respiratory failure with hypoxia ([place])   . Ileus ([place])   . Encounter for preventive health examination 03/14/2015  . Breast cancer screening 03/14/2015  . Need for hepatitis C screening test 03/14/2015  . COPD with hypoxia ([place]) 02/16/2015  . COPD exacerbation ([place]) 02/03/2015  . Vitamin D deficiency 01/11/2015  . S/P hysterectomy with oophorectomy 09/22/2013  . Pulmonary hypertension ([place]) 07/14/2013  . Hypothyroidism 05/04/2012  . Severe mitral regurgitation by prior echocardiogram 01/11/2012  . Obesity (BMI 30-39.9) 08/04/2011  . Depression   . Hypertension   . Congenital absence of one kidney   .  History of sciatica     Past Surgical History  Procedure Laterality Date  . Combined hysterectomy abdominal w/ a&p repair / oophorectomy  1996    benign tumor  . Inner ear surgery      bilateral  . Abdominal hysterectomy    . Breast biopsy Right 2011    [facility]< benign    Current Outpatient Rx  Name  Route  Sig  Dispense  Refill  . albuterol (PROVENTIL HFA;VENTOLIN HFA) 108 (90 BASE) MCG/ACT inhaler   Inhalation   Inhale 2 puffs into the lungs every 6 (six) hours as needed for wheezing or shortness of breath. WITH A SPACER   1 Inhaler   0   . azithromycin (ZITHROMAX) 500 MG tablet   Oral   Take 1 tablet (500 mg total) by mouth daily. Take 1 tablet daily for 3 days.   5 tablet   0   . beclomethasone (QVAR) 40 MCG/ACT inhaler   Inhalation   Inhale 2 puffs into the lungs 2 (two) times daily.   1 Inhaler   0   . calcium-vitamin D (OSCAL WITH D) 500-200 MG-UNIT per tablet   Oral   Take 1 tablet by mouth daily with breakfast.   90 tablet   2   . hydrochlorothiazide (HYDRODIURIL) 25 MG tablet   Oral   Take 25 mg by mouth daily.         [name] ipratropium-albuterol (DUONEB) 0.5-2.5 (3) MG/3ML SOLN   Nebulization   Take 3 mLs  by nebulization every 4 (four) hours.   360 mL   5   . levothyroxine (SYNTHROID, LEVOTHROID) 112 MCG tablet   Oral   Take 112 mcg by mouth daily before breakfast.         . levothyroxine (SYNTHROID, LEVOTHROID) 112 MCG tablet      TAKE 1 TABLET BY MOUTH EVERY DAY   90 tablet   2   . loratadine-pseudoephedrine (CLARITIN-D 24-HOUR) 10-240 MG 24 hr tablet   Oral   Take 1 tablet by mouth daily.         [name] losartan (COZAAR) 100 MG tablet   Oral   Take 1 tablet (100 mg total) by mouth daily.   30 tablet   3   . montelukast (SINGULAIR) 10 MG tablet   Oral   Take 1 tablet (10 mg total) by mouth at bedtime.   30 tablet   3   . polyethylene glycol powder (GLYCOLAX/MIRALAX) powder   Oral   Take 17 g by mouth 2 (two) times daily as needed.    3350 g   1   . Umeclidinium-Vilanterol (ANORO ELLIPTA) 62.5-25 MCG/INH AEPB   Inhalation   Inhale 1 puff into the lungs daily.         . Vitamin D, Ergocalciferol, (DRISDOL) 50000 units CAPS capsule   Oral   Take 50,000 Units by mouth every 7 (seven) days. Take on Saturday           Allergies Codeine; Penicillin g; Zithromax; and Prednisone  Family History  Problem Relation Age of Onset  . Multiple sclerosis Mother   . Coronary artery disease Father   . Heart disease Brother   . Breast cancer Paternal Aunt     Social History Social History  Substance Use Topics  . Smoking status: Never Smoker   . Smokeless tobacco: Never Used  . Alcohol Use: No    Review of Systems  Constitutional: Negative for fever. Eyes: Negative for redness ENT: Negative for sore throat Cardiovascular: Negative for chest pain Respiratory: As above Gastrointestinal: Negative for abdominal pain Genitourinary: Negative for dysuria. Musculoskeletal: Negative for back pain. Skin: Negative for rash. Neurological: Negative for focal weakness Psychiatric: Anxiety  ____________________________________________   PHYSICAL EXAM:  VITAL SIGNS: ED Triage Vitals  Enc Vitals Group     BP 08/25/15 1149 120/61 mmHg     Pulse Rate 08/25/15 1149 100     Resp 08/25/15 1149 18     Temp 08/25/15 1149 97.5 F (36.4 C)     Temp src --      SpO2 08/25/15 1149 97 %     Weight 08/25/15 1149 195 lb (88.451 kg)     Height 08/25/15 1149 5\' 2"  (1.575 m)     Head Cir --      Peak Flow --      Pain Score --      Pain Loc --      Pain Edu? --      Excl. in [place]? --      Constitutional: Alert and oriented. No acute distress Eyes: Conjunctivae are normal. No erythema or injection ENT   Head: Normocephalic and atraumatic.   Mouth/Throat: Mucous membranes are moist. Cardiovascular: Normal rate, regular rhythm. Normal and symmetric distal pulses are present in the upper extremities Respiratory:  Normal respiratory effort without tachypnea nor retractions. Scattered mild wheezes Gastrointestinal: Soft and non-tender in all quadrants. No distention. There is no CVA tenderness. Genitourinary: deferred Musculoskeletal: Nontender with normal range  of motion in all extremities. No lower extremity tenderness nor edema. Neurologic:  Normal speech and language. No gross focal neurologic deficits are appreciated. Skin:  Skin is warm, dry and intact. No rash noted. Psychiatric: Mood and affect are normal. Patient exhibits appropriate insight and judgment.  ____________________________________________    LABS (pertinent positives/negatives)  Labs Reviewed  BASIC METABOLIC PANEL - Abnormal; Notable for the following:    Calcium 8.3 (*)    All other components within normal limits  CBC - Abnormal; Notable for the following:    Hemoglobin 11.8 (*)    RDW 14.8 (*)    All other components within normal limits  TROPONIN I    ____________________________________________   EKG  ED ECG REPORT I, Lavonia Drafts, the attending physician, personally viewed and interpreted this ECG.  Date: 08/25/2015 EKG Time: 12:20 PM Rate: 96 Rhythm: normal sinus rhythm QRS Axis: normal Intervals: normal ST/T Wave abnormalities: normal Conduction Disturbances: none Narrative Interpretation: unremarkable   ____________________________________________    RADIOLOGY  cxr unremarkable   ____________________________________________   PROCEDURES  Procedure(s) performed: none  Critical Care performed: none  ____________________________________________   INITIAL IMPRESSION / ASSESSMENT AND PLAN / ED COURSE  Pertinent labs & imaging results that were available during my care of the patient were reviewed by me and considered in my medical decision making (see chart for details).  Patient presents with complaints of mild shortness of breath and cough, she has a history of COPD. We will check  labs, EKG, chest x-ray, give DuoNeb's and reevaluate.  Work up is unremarkable. Patient remains well appearing and in no distress. No evidence of pna. Patient feels better after duoneb. Vital signs are unremarkable.   I will d/c the patient with azithromycin, despite her allergy list she assures me she can take azithromycin without problem.   ____________________________________________   FINAL CLINICAL IMPRESSION(S) / ED DIAGNOSES  Final diagnoses:  COPD exacerbation ([place])          Lavonia Drafts, MD 08/25/15 1538

## 2015-09-04 ENCOUNTER — Inpatient Hospital Stay
Admission: EM | Admit: 2015-09-04 | Discharge: 2015-09-08 | DRG: 191 | Disposition: A | Discharge status: Home Health | Payer: Medicare Other | Attending: Internal Medicine | Admitting: Internal Medicine

## 2015-09-04 ENCOUNTER — Emergency Department: Payer: Medicare Other

## 2015-09-04 ENCOUNTER — Encounter: Payer: Self-pay | Admitting: Emergency Medicine

## 2015-09-04 DIAGNOSIS — Z7951 Long term (current) use of inhaled steroids: Secondary | ICD-10-CM

## 2015-09-04 DIAGNOSIS — Z888 Allergy status to other drugs, medicaments and biological substances status: Secondary | ICD-10-CM | POA: Diagnosis not present

## 2015-09-04 DIAGNOSIS — R0602 Shortness of breath: Secondary | ICD-10-CM | POA: Diagnosis not present

## 2015-09-04 DIAGNOSIS — J441 Chronic obstructive pulmonary disease with (acute) exacerbation: Secondary | ICD-10-CM | POA: Diagnosis present

## 2015-09-04 DIAGNOSIS — Q600 Renal agenesis, unilateral: Secondary | ICD-10-CM

## 2015-09-04 DIAGNOSIS — Z880 Allergy status to penicillin: Secondary | ICD-10-CM

## 2015-09-04 DIAGNOSIS — Z803 Family history of malignant neoplasm of breast: Secondary | ICD-10-CM | POA: Diagnosis not present

## 2015-09-04 DIAGNOSIS — J9610 Chronic respiratory failure, unspecified whether with hypoxia or hypercapnia: Secondary | ICD-10-CM | POA: Diagnosis present

## 2015-09-04 DIAGNOSIS — I10 Essential (primary) hypertension: Secondary | ICD-10-CM | POA: Diagnosis present

## 2015-09-04 DIAGNOSIS — Z881 Allergy status to other antibiotic agents status: Secondary | ICD-10-CM

## 2015-09-04 DIAGNOSIS — Z820 Family history of epilepsy and other diseases of the nervous system: Secondary | ICD-10-CM | POA: Diagnosis not present

## 2015-09-04 DIAGNOSIS — Z9981 Dependence on supplemental oxygen: Secondary | ICD-10-CM

## 2015-09-04 DIAGNOSIS — E669 Obesity, unspecified: Secondary | ICD-10-CM | POA: Diagnosis present

## 2015-09-04 DIAGNOSIS — F419 Anxiety disorder, unspecified: Secondary | ICD-10-CM | POA: Diagnosis present

## 2015-09-04 DIAGNOSIS — F329 Major depressive disorder, single episode, unspecified: Secondary | ICD-10-CM | POA: Diagnosis present

## 2015-09-04 DIAGNOSIS — E559 Vitamin D deficiency, unspecified: Secondary | ICD-10-CM | POA: Diagnosis present

## 2015-09-04 DIAGNOSIS — R05 Cough: Secondary | ICD-10-CM | POA: Diagnosis not present

## 2015-09-04 DIAGNOSIS — E039 Hypothyroidism, unspecified: Secondary | ICD-10-CM | POA: Diagnosis present

## 2015-09-04 DIAGNOSIS — Z79899 Other long term (current) drug therapy: Secondary | ICD-10-CM | POA: Diagnosis not present

## 2015-09-04 DIAGNOSIS — Z885 Allergy status to narcotic agent status: Secondary | ICD-10-CM | POA: Diagnosis not present

## 2015-09-04 DIAGNOSIS — Z8249 Family history of ischemic heart disease and other diseases of the circulatory system: Secondary | ICD-10-CM | POA: Diagnosis not present

## 2015-09-04 LAB — TROPONIN I: Troponin I: <0.03 ng/mL (ref <0.031)

## 2015-09-04 LAB — CBC WITH DIFFERENTIAL/PLATELET
BASOS ABS: 0 10*3/uL (ref 0–0.1)
BASOS PCT: 1 %
EOS PCT: 9 %
Eosinophils Absolute: 0.7 10*3/uL (ref 0–0.7)
HCT: 38 % (ref 35.0–47.0)
Hemoglobin: 12.5 g/dL (ref 12.0–16.0)
LYMPHS ABS: 1.4 10*3/uL (ref 1.0–3.6)
LYMPHS PCT: 18 %
MCH: 29.8 pg (ref 26.0–34.0)
MCHC: 32.9 g/dL (ref 32.0–36.0)
MCV: 90.6 fL (ref 80.0–100.0)
MONO ABS: 0.7 10*3/uL (ref 0.2–0.9)
MONOS PCT: 9 %
Neutro Abs: 4.9 10*3/uL (ref 1.4–6.5)
Neutrophils Relative %: 63 %
PLATELETS: 155 10*3/uL (ref 150–440)
RBC: 4.2 MIL/uL (ref 3.80–5.20)
RDW: 14.5 % (ref 11.5–14.5)
WBC: 7.8 10*3/uL (ref 3.6–11.0)

## 2015-09-04 LAB — COMPREHENSIVE METABOLIC PANEL
ALBUMIN: 4.2 g/dL (ref 3.5–5.0)
ALK PHOS: 57 U/L (ref 38–126)
ALT: 12 U/L — AB (ref 14–54)
ANION GAP: 7 (ref 5–15)
AST: 17 U/L (ref 15–41)
BUN: 18 mg/dL (ref 6–20)
CALCIUM: 8.5 mg/dL — AB (ref 8.9–10.3)
CO2: 30 mmol/L (ref 22–32)
Chloride: 104 mmol/L (ref 101–111)
Creatinine, Ser: 0.72 mg/dL (ref 0.44–1.00)
GFR calc Af Amer: >60 mL/min (ref >60)
GFR calc non Af Amer: >60 mL/min (ref >60)
GLUCOSE: 90 mg/dL (ref 65–99)
Potassium: 4.4 mmol/L (ref 3.5–5.1)
SODIUM: 141 mmol/L (ref 135–145)
Total Bilirubin: 0.4 mg/dL (ref 0.3–1.2)
Total Protein: 7 g/dL (ref 6.5–8.1)

## 2015-09-04 LAB — BRAIN NATRIURETIC PEPTIDE: B Natriuretic Peptide: 82 pg/mL (ref 0.0–100.0)

## 2015-09-04 LAB — MAGNESIUM: MAGNESIUM: 1.9 mg/dL (ref 1.7–2.4)

## 2015-09-04 MED ORDER — PSEUDOEPHEDRINE HCL ER 120 MG PO TB12
120.0000 mg | ORAL_TABLET | Freq: Two times a day (BID) | ORAL | Status: DC
  Filled 2015-09-04 (×2): qty 1

## 2015-09-04 MED ORDER — IPRATROPIUM-ALBUTEROL 0.5-2.5 (3) MG/3ML IN SOLN
3.0000 mL | Freq: Once | RESPIRATORY_TRACT | Status: AC
  Administered 2015-09-04: 3 mL via RESPIRATORY_TRACT

## 2015-09-04 MED ORDER — ENOXAPARIN SODIUM 40 MG/0.4ML ~~LOC~~ SOLN
40.0000 mg | SUBCUTANEOUS | Status: DC
  Administered 2015-09-04 – 2015-09-07 (×4): 40 mg via SUBCUTANEOUS
  Filled 2015-09-04 (×4): qty 0.4

## 2015-09-04 MED ORDER — ONDANSETRON HCL 4 MG/2ML IJ SOLN
4.0000 mg | Freq: Four times a day (QID) | INTRAMUSCULAR | Status: DC | PRN
  Administered 2015-09-05: 4 mg via INTRAVENOUS
  Filled 2015-09-04: qty 2

## 2015-09-04 MED ORDER — LORATADINE 10 MG PO TABS
10.0000 mg | ORAL_TABLET | Freq: Every day | ORAL | Status: DC
  Administered 2015-09-05 – 2015-09-08 (×4): 10 mg via ORAL
  Filled 2015-09-04 (×4): qty 1

## 2015-09-04 MED ORDER — LORATADINE-PSEUDOEPHEDRINE ER 10-240 MG PO TB24: 1.0000 | ORAL_TABLET | Freq: Every day | ORAL | Status: DC

## 2015-09-04 MED ORDER — ACETAMINOPHEN 650 MG RE SUPP: 650.0000 mg | Freq: Four times a day (QID) | RECTAL | Status: DC | PRN

## 2015-09-04 MED ORDER — IPRATROPIUM-ALBUTEROL 0.5-2.5 (3) MG/3ML IN SOLN
3.0000 mL | Freq: Four times a day (QID) | RESPIRATORY_TRACT | Status: DC
  Administered 2015-09-04 – 2015-09-05 (×3): 3 mL via RESPIRATORY_TRACT
  Filled 2015-09-04 (×3): qty 3

## 2015-09-04 MED ORDER — IPRATROPIUM-ALBUTEROL 0.5-2.5 (3) MG/3ML IN SOLN
RESPIRATORY_TRACT | Status: AC
  Filled 2015-09-04: qty 3

## 2015-09-04 MED ORDER — IPRATROPIUM-ALBUTEROL 0.5-2.5 (3) MG/3ML IN SOLN
3.0000 mL | Freq: Once | RESPIRATORY_TRACT | Status: AC
  Administered 2015-09-04: 3 mL via RESPIRATORY_TRACT
  Filled 2015-09-04: qty 3

## 2015-09-04 MED ORDER — LEVOTHYROXINE SODIUM 112 MCG PO TABS
112.0000 ug | ORAL_TABLET | Freq: Every day | ORAL | Status: DC
  Administered 2015-09-05 – 2015-09-08 (×3): 112 ug via ORAL
  Filled 2015-09-04 (×4): qty 1

## 2015-09-04 MED ORDER — UMECLIDINIUM-VILANTEROL 62.5-25 MCG/INH IN AEPB
1.0000 | INHALATION_SPRAY | Freq: Every day | RESPIRATORY_TRACT | Status: DC
Start: 2015-09-05 — End: 2015-09-08
  Administered 2015-09-05 – 2015-09-08 (×4): 1 via RESPIRATORY_TRACT
  Filled 2015-09-04: qty 14

## 2015-09-04 MED ORDER — FLUTICASONE PROPIONATE 50 MCG/ACT NA SUSP
2.0000 | Freq: Every day | NASAL | Status: DC
  Administered 2015-09-05 – 2015-09-08 (×4): 2 via NASAL
  Filled 2015-09-04: qty 16

## 2015-09-04 MED ORDER — ONDANSETRON HCL 4 MG PO TABS: 4.0000 mg | ORAL_TABLET | Freq: Four times a day (QID) | ORAL | Status: DC | PRN

## 2015-09-04 MED ORDER — SODIUM CHLORIDE 0.9% FLUSH: 3.0000 mL | INTRAVENOUS | Status: DC | PRN

## 2015-09-04 MED ORDER — HYDROCHLOROTHIAZIDE 25 MG PO TABS
25.0000 mg | ORAL_TABLET | Freq: Every day | ORAL | Status: DC
  Administered 2015-09-05 – 2015-09-07 (×3): 25 mg via ORAL
  Filled 2015-09-04 (×4): qty 1

## 2015-09-04 MED ORDER — IPRATROPIUM-ALBUTEROL 0.5-2.5 (3) MG/3ML IN SOLN
RESPIRATORY_TRACT | Status: AC
  Administered 2015-09-04: 3 mL via RESPIRATORY_TRACT
  Filled 2015-09-04: qty 3

## 2015-09-04 MED ORDER — MONTELUKAST SODIUM 10 MG PO TABS
10.0000 mg | ORAL_TABLET | Freq: Every day | ORAL | Status: DC
  Administered 2015-09-04 – 2015-09-07 (×4): 10 mg via ORAL
  Filled 2015-09-04 (×4): qty 1

## 2015-09-04 MED ORDER — SODIUM CHLORIDE 0.9% FLUSH
3.0000 mL | Freq: Two times a day (BID) | INTRAVENOUS | Status: DC
  Administered 2015-09-04 – 2015-09-07 (×7): 3 mL via INTRAVENOUS

## 2015-09-04 MED ORDER — SODIUM CHLORIDE 0.9 % IV SOLN: 250.0000 mL | INTRAVENOUS | Status: DC | PRN

## 2015-09-04 MED ORDER — BUDESONIDE 0.25 MG/2ML IN SUSP
0.2500 mg | Freq: Two times a day (BID) | RESPIRATORY_TRACT | Status: DC
  Administered 2015-09-04 – 2015-09-08 (×8): 0.25 mg via RESPIRATORY_TRACT
  Filled 2015-09-04 (×8): qty 2

## 2015-09-04 MED ORDER — DEXTROMETHORPHAN POLISTIREX ER 30 MG/5ML PO SUER
60.0000 mg | Freq: Two times a day (BID) | ORAL | Status: DC | PRN
  Filled 2015-09-04 (×2): qty 10

## 2015-09-04 MED ORDER — LOSARTAN POTASSIUM 50 MG PO TABS
100.0000 mg | ORAL_TABLET | Freq: Every day | ORAL | Status: DC
Start: 2015-09-05 — End: 2015-09-08
  Administered 2015-09-05 – 2015-09-08 (×4): 100 mg via ORAL
  Filled 2015-09-04 (×5): qty 2

## 2015-09-04 MED ORDER — ACETAMINOPHEN 325 MG PO TABS
650.0000 mg | ORAL_TABLET | Freq: Four times a day (QID) | ORAL | Status: DC | PRN
Start: 2015-09-04 — End: 2015-09-08

## 2015-09-04 MED ORDER — FLUTICASONE PROPIONATE HFA 110 MCG/ACT IN AERO: 1.0000 | INHALATION_SPRAY | Freq: Two times a day (BID) | RESPIRATORY_TRACT | Status: DC

## 2015-09-04 MED ORDER — ALBUTEROL SULFATE (2.5 MG/3ML) 0.083% IN NEBU
2.5000 mg | INHALATION_SOLUTION | RESPIRATORY_TRACT | Status: DC | PRN
  Administered 2015-09-05 – 2015-09-08 (×13): 2.5 mg via RESPIRATORY_TRACT
  Filled 2015-09-04 (×14): qty 3

## 2015-09-04 MED ORDER — ALBUTEROL SULFATE (2.5 MG/3ML) 0.083% IN NEBU: 3.0000 mL | INHALATION_SOLUTION | Freq: Four times a day (QID) | RESPIRATORY_TRACT | Status: DC | PRN

## 2015-09-04 NOTE — H&P (Signed)
[facility] [place] NAME: Lauren Bartlett    MR#:  [id number]  DATE OF BIRTH:  [birth year]  DATE OF ADMISSION:  09/04/2015  PRIMARY CARE PHYSICIAN: [name], MD   REQUESTING/REFERRING PHYSICIAN: Nena Polio, MD  CHIEF COMPLAINT:   Chief Complaint  Patient presents with  . Shortness of Breath   Cough, shortness of breath and wheezing for 2 days. HISTORY OF PRESENT ILLNESS:  Lauren Bartlett  is a 60 y.o. female with a known history of chronic respiratory failure on home oxygen, COPD, hypertension and emphysema. The patient has had cough, wheezing and shortness of breath for 2 days. He denies any fever or chills, no chest pain or palpitations or leg edema. Patient was treated with nebulizer 3 times without improvement. Dr. Rip Harbour request admission.  PAST MEDICAL HISTORY:   Past Medical History  Diagnosis Date  . Congenital absence of one kidney   . History of sciatica   . Depression     treated at [facility]  . Hypertension   . hypothyrodism   . COPD (chronic obstructive pulmonary disease) ([place])   . Anxiety   . Emphysema/COPD [facility])     PAST SURGICAL HISTORY:   Past Surgical History  Procedure Laterality Date  . Combined hysterectomy abdominal w/ a&p repair / oophorectomy  1996    benign tumor  . Inner ear surgery      bilateral  . Abdominal hysterectomy    . Breast biopsy Right 2011    [facility]< benign    SOCIAL HISTORY:   Social History  Substance Use Topics  . Smoking status: Never Smoker   . Smokeless tobacco: Never Used  . Alcohol Use: No    FAMILY HISTORY:   Family History  Problem Relation Age of Onset  . Multiple sclerosis Mother   . Coronary artery disease Father   . Heart disease Brother   . Breast cancer Paternal Aunt     DRUG ALLERGIES:   Allergies  Allergen Reactions  . Codeine Hives and Nausea And Vomiting  . Penicillins Hives, Itching and Other (See Comments)    Has patient had a PCN  reaction causing immediate rash, facial/tongue/throat swelling, SOB or lightheadedness with hypotension: No Has patient had a PCN reaction causing severe rash involving mucus membranes or skin necrosis: No Has patient had a PCN reaction that required hospitalization No Has patient had a PCN reaction occurring within the last 10 years: No If all of the above answers are "NO", then may proceed with Cephalosporin use.  [name] Zithromax [Azithromycin] Other (See Comments)    Reaction:  Hallucinations   . Prednisone Palpitations and Other (See Comments)    Reaction:  Hallucinations     REVIEW OF SYSTEMS:  CONSTITUTIONAL: No fever, fatigue or weakness.  EYES: No blurred or double vision.  EARS, NOSE, AND THROAT: No tinnitus or ear pain.  RESPIRATORY: has cough, shortness of breath, wheezing that no hemoptysis.  CARDIOVASCULAR: No chest pain, orthopnea, edema.  GASTROINTESTINAL: No nausea, vomiting, diarrhea or abdominal pain.  GENITOURINARY: No dysuria, hematuria.  ENDOCRINE: No polyuria, nocturia,  HEMATOLOGY: No anemia, easy bruising or bleeding SKIN: No rash or lesion. MUSCULOSKELETAL: No joint pain or arthritis.   NEUROLOGIC: No tingling, numbness, weakness.  PSYCHIATRY: No anxiety or depression.   MEDICATIONS AT HOME:   Prior to Admission medications   Medication Sig Start Date End Date Taking? Authorizing Provider  albuterol (PROVENTIL HFA;VENTOLIN HFA) 108 (90 Base) MCG/ACT  inhaler Inhale 2 puffs into the lungs every 6 (six) hours as needed for wheezing or shortness of breath. Pt uses with SPACER.   Yes Historical Provider, MD  dextromethorphan (DELSYM) 30 MG/5ML liquid Take 60 mg by mouth every 12 (twelve) hours as needed for cough.   Yes Historical Provider, MD  fluticasone (FLONASE) 50 MCG/ACT nasal spray Place 2 sprays into both nostrils daily.   Yes Historical Provider, MD  fluticasone (FLOVENT HFA) 110 MCG/ACT inhaler Inhale 1 puff into the lungs 2 (two) times daily.   Yes  Historical Provider, MD  hydrochlorothiazide (HYDRODIURIL) 25 MG tablet Take 25 mg by mouth daily.   Yes Historical Provider, MD  ipratropium-albuterol (DUONEB) 0.5-2.5 (3) MG/3ML SOLN Take 3 mLs by nebulization every 4 (four) hours as needed (for wheezing/shortness of breath).   Yes Historical Provider, MD  levothyroxine (SYNTHROID, LEVOTHROID) 112 MCG tablet Take 112 mcg by mouth daily before breakfast.   Yes Historical Provider, MD  loratadine-pseudoephedrine (CLARITIN-D 24-HOUR) 10-240 MG 24 hr tablet Take 1 tablet by mouth daily.   Yes Historical Provider, MD  losartan (COZAAR) 100 MG tablet Take 1 tablet (100 mg total) by mouth daily. 05/28/15  Yes [name], MD  montelukast (SINGULAIR) 10 MG tablet Take 1 tablet (10 mg total) by mouth at bedtime. 06/05/15  Yes Fritzi Mandes, MD  Umeclidinium-Vilanterol (ANORO ELLIPTA) 62.5-25 MCG/INH AEPB Inhale 1 puff into the lungs daily.   Yes Historical Provider, MD  Vitamin D, Ergocalciferol, (DRISDOL) 50000 units CAPS capsule Take 50,000 Units by mouth every 7 (seven) days. Pt takes on Saturday.   Yes Historical Provider, MD  polyethylene glycol powder (GLYCOLAX/MIRALAX) powder Take 17 g by mouth 2 (two) times daily as needed. Patient not taking: Reported on 09/04/2015 08/03/15   [name], MD      VITAL SIGNS:  Blood pressure 128/97, pulse 98, temperature 98.9 F (37.2 C), temperature source Oral, resp. rate 17, height 5\' 2"  (1.575 m), weight 195 lb (88.451 kg), SpO2 97 %.  PHYSICAL EXAMINATION:  GENERAL:  60 y.o.-year-old patient lying in the bed with no acute distress.  EYES: Pupils equal, round, reactive to light and accommodation. No scleral icterus. Extraocular muscles intact.  HEENT: Head atraumatic, normocephalic. Oropharynx and nasopharynx clear.  NECK:  Supple, no jugular venous distention. No thyroid enlargement, no tenderness.  LUNGS: Normal breath sounds bilaterally, bilateral moderate to severe wheezing, no rales,rhonchi or  crepitation. No use of accessory muscles of respiration.  CARDIOVASCULAR: S1, S2 normal. No murmurs, rubs, or gallops.  ABDOMEN: Soft, nontender, nondistended. Bowel sounds present. No organomegaly or mass.  EXTREMITIES: No pedal edema, cyanosis, or clubbing.  NEUROLOGIC: Cranial nerves II through XII are intact. Muscle strength 5/5 in all extremities. Sensation intact. Gait not checked.  PSYCHIATRIC: The patient is alert and oriented x 3.  SKIN: No obvious rash, lesion, or ulcer.   LABORATORY PANEL:   CBC  Recent Labs Lab 09/04/15 1702  WBC 7.8  HGB 12.5  HCT 38.0  PLT 155   ------------------------------------------------------------------------------------------------------------------  Chemistries   Recent Labs Lab 09/04/15 1702  NA 141  K 4.4  CL 104  CO2 30  GLUCOSE 90  BUN 18  CREATININE 0.72  CALCIUM 8.5*  AST 17  ALT 12*  ALKPHOS 57  BILITOT 0.4   ------------------------------------------------------------------------------------------------------------------  Cardiac Enzymes  Recent Labs Lab 09/04/15 1702  TROPONINI <0.03   ------------------------------------------------------------------------------------------------------------------  RADIOLOGY:  Dg Chest Portable 1 View  09/04/2015  CLINICAL DATA:  Short of breath, cough  with phlegm EXAM: PORTABLE CHEST 1 VIEW COMPARISON:  08/25/2015 FINDINGS: Normal mediastinum and cardiac silhouette. Normal pulmonary vasculature. No evidence of effusion, infiltrate, or pneumothorax. No acute bony abnormality. IMPRESSION: No acute cardiopulmonary process. Electronically Signed   By: Suzy Bouchard M.D.   On: 09/04/2015 16:56    EKG:   Orders placed or performed during the hospital encounter of 09/04/15  . EKG 12-Lead  . EKG 12-Lead    IMPRESSION AND PLAN:   COPD exacerbation and chronic respiratory failure on home oxygen. The patient will be admitted to medical floor. Start DuoNeb every 6 hours,  continue home nebulizer treatment, continue home oxygen. She is allergic to Zithromax and Steroid.   hypertension. Controlled without medication.  All the records are reviewed and case discussed with ED provider. Management plans discussed with the patient, family and they are in agreement.  CODE STATUS: full code  TOTAL TIME TAKING CARE OF THIS PATIENT: 53 minutes.    Demetrios Loll M.D on 09/04/2015 at 10:01 PM  Between 7am to 6pm - Pager - [phone]  After 6pm go to [email or web address] - password [email or web address] [facility]  Office  [phone]  CC: Primary care physician; [name], MD

## 2015-09-04 NOTE — Progress Notes (Signed)
Peak Flow done. Best of 3 was 150 l/min with good pt effort.

## 2015-09-04 NOTE — ED Notes (Signed)
Pt from home with sob since yesterday; states she came in because she is tired of feeling like she can't breathe. Pt alert &  Oriented.

## 2015-09-04 NOTE — ED Notes (Signed)
Pt with copd exacerbation. States that she has periodic episodes of exacerbation. Pt alert & oriented. NAD noted.

## 2015-09-04 NOTE — ED Provider Notes (Signed)
[facility] Emergency Department Provider Note   ____________________________________________  Time seen: Approximately 4:39 PM  I have reviewed the triage vital signs and the nursing notes.   HISTORY  Chief Complaint Shortness of Breath    HPI Lauren Bartlett is a 60 y.o. female history of COPD. She complains of wheezing starting yesterday. She is also coughing up thick white and greenish phlegm since yesterday. She usually sees Dr. Raul Del and Dr. to low. She is having to wear her oxygen all the time even when she sitting which she usually does not do. Shortness of breath is moderately severe. There are no other symptoms. She is not running a fever having any nausea vomiting or diarrhea. Nothing seems to make it better or worse.   Past Medical History  Diagnosis Date  . Congenital absence of one kidney   . History of sciatica   . Depression     treated at [place]  . Hypertension   . hypothyrodism   . COPD (chronic obstructive pulmonary disease) ([place])   . Anxiety   . Emphysema/COPD [facility])     Patient Active Problem List   Diagnosis Date Noted  . Constipation 08/05/2015  . Hospital discharge follow-up 08/05/2015  . Thrombocytopenia ([place]) 08/05/2015  . Acute respiratory failure with hypoxia ([place])   . Chronic respiratory failure with hypoxia ([place])   . Ileus ([place])   . Encounter for preventive health examination 03/14/2015  . Breast cancer screening 03/14/2015  . Need for hepatitis C screening test 03/14/2015  . COPD with hypoxia ([place]) 02/16/2015  . COPD exacerbation ([place]) 02/03/2015  . Vitamin D deficiency 01/11/2015  . S/P hysterectomy with oophorectomy 09/22/2013  . Pulmonary hypertension ([place]) 07/14/2013  . Hypothyroidism 05/04/2012  . Severe mitral regurgitation by prior echocardiogram 01/11/2012  . Obesity (BMI 30-39.9) 08/04/2011  . Depression   . Hypertension   . Congenital absence of one kidney   . History of sciatica     Past  Surgical History  Procedure Laterality Date  . Combined hysterectomy abdominal w/ a&p repair / oophorectomy  1996    benign tumor  . Inner ear surgery      bilateral  . Abdominal hysterectomy    . Breast biopsy Right 2011    [facility]< benign    Current Outpatient Rx  Name  Route  Sig  Dispense  Refill  . albuterol (PROVENTIL HFA;VENTOLIN HFA) 108 (90 BASE) MCG/ACT inhaler   Inhalation   Inhale 2 puffs into the lungs every 6 (six) hours as needed for wheezing or shortness of breath. WITH A SPACER   1 Inhaler   0   . beclomethasone (QVAR) 40 MCG/ACT inhaler   Inhalation   Inhale 2 puffs into the lungs 2 (two) times daily.   1 Inhaler   0   . calcium-vitamin D (OSCAL WITH D) 500-200 MG-UNIT per tablet   Oral   Take 1 tablet by mouth daily with breakfast.   90 tablet   2   . hydrochlorothiazide (HYDRODIURIL) 25 MG tablet   Oral   Take 25 mg by mouth daily.         [name] ipratropium-albuterol (DUONEB) 0.5-2.5 (3) MG/3ML SOLN   Nebulization   Take 3 mLs by nebulization every 4 (four) hours.   360 mL   5   . levothyroxine (SYNTHROID, LEVOTHROID) 112 MCG tablet   Oral   Take 112 mcg by mouth daily before breakfast.         .  levothyroxine (SYNTHROID, LEVOTHROID) 112 MCG tablet      TAKE 1 TABLET BY MOUTH EVERY DAY   90 tablet   2   . loratadine-pseudoephedrine (CLARITIN-D 24-HOUR) 10-240 MG 24 hr tablet   Oral   Take 1 tablet by mouth daily.         [name] losartan (COZAAR) 100 MG tablet   Oral   Take 1 tablet (100 mg total) by mouth daily.   30 tablet   3   . montelukast (SINGULAIR) 10 MG tablet   Oral   Take 1 tablet (10 mg total) by mouth at bedtime.   30 tablet   3   . polyethylene glycol powder (GLYCOLAX/MIRALAX) powder   Oral   Take 17 g by mouth 2 (two) times daily as needed.   3350 g   1   . Umeclidinium-Vilanterol (ANORO ELLIPTA) 62.5-25 MCG/INH AEPB   Inhalation   Inhale 1 puff into the lungs daily.         . Vitamin D, Ergocalciferol,  (DRISDOL) 50000 units CAPS capsule   Oral   Take 50,000 Units by mouth every 7 (seven) days. Take on Saturday           Allergies Codeine; Penicillin g; Zithromax; and Prednisone  Family History  Problem Relation Age of Onset  . Multiple sclerosis Mother   . Coronary artery disease Father   . Heart disease Brother   . Breast cancer Paternal Aunt     Social History Social History  Substance Use Topics  . Smoking status: Never Smoker   . Smokeless tobacco: Never Used  . Alcohol Use: No    Review of Systems Constitutional: No fever/chills Eyes: No visual changes. ENT: No sore throat. Cardiovascular: Denies chest pain. Respiratory: See history of present illness Gastrointestinal: No abdominal pain.  No nausea, no vomiting.  No diarrhea.  No constipation. Genitourinary: Negative for dysuria. Musculoskeletal: Negative for back pain. Skin: Negative for rash. Neurological: Negative for headaches, focal weakness or numbness.  10-point ROS otherwise negative.  ____________________________________________   PHYSICAL EXAM:  VITAL SIGNS: ED Triage Vitals  Enc Vitals Group     BP 09/04/15 1557 130/110 mmHg     Pulse Rate 09/04/15 1557 88     Resp 09/04/15 1557 20     Temp 09/04/15 1557 99 F (37.2 C)     Temp Source 09/04/15 1557 Oral     SpO2 09/04/15 1557 99 %     Weight 09/04/15 1557 195 lb (88.451 kg)     Height 09/04/15 1557 5\' 2"  (1.575 m)     Head Cir --      Peak Flow --      Pain Score 09/04/15 1613 4     Pain Loc --      Pain Edu? --      Excl. in [place]? --    Constitutional: Alert and oriented. Well appearing and in no acute distress. Eyes: Conjunctivae are normal. PERRL. EOMI. Head: Atraumatic. Nose: No congestion/rhinnorhea. Mouth/Throat: Mucous membranes are moist.  Oropharynx non-erythematous. Neck: No stridor.  Cardiovascular: Normal rate, regular rhythm. Completely unable to hear heart sounds due to wheezing  Good peripheral  circulation. Respiratory: Normal respiratory effort.  No retractions. Lungs Diffuse wheezes Gastrointestinal: Soft and nontender. No distention. No abdominal bruits. No CVA tenderness. Musculoskeletal: No lower extremity tenderness nor edema.  No joint effusions. Neurologic:  Normal speech and language. No gross focal neurologic deficits are appreciated. No gait instability. Skin:  Skin is warm,  dry and intact. No rash noted. Psychiatric: Mood and affect are normal. Speech and behavior are normal.  ____________________________________________   LABS (all labs ordered are listed, but only abnormal results are displayed)  Labs Reviewed  COMPREHENSIVE METABOLIC PANEL - Abnormal; Notable for the following:    Calcium 8.5 (*)    ALT 12 (*)    All other components within normal limits  BRAIN NATRIURETIC PEPTIDE  TROPONIN I  CBC WITH DIFFERENTIAL/PLATELET   ____________________________________________  EKG  EKG read and interpreted by me shows normal sinus rhythm rate of 91 normal axis no acute ST-T wave changes ____________________________________________  RADIOLOGY  No pneumonia on chest x-ray per radiology ____________________________________________   PROCEDURES  Patient has had 3 nebulizer treatments. Patient hallucinates with steroids I will not give her prednisone or site Medrol. Patient is still very short of breath and wheezy. Her peak flows only 150 I will see if I can get her in the hospital for at least observation and further treatment.  ____________________________________________   INITIAL IMPRESSION / [place] / ED COURSE  Pertinent labs & imaging results that were available during my care of the patient were reviewed by me and considered in my medical decision making (see chart for details).   ____________________________________________   FINAL CLINICAL IMPRESSION(S) / ED DIAGNOSES  Final diagnoses:  COPD exacerbation ([place])      NEW  MEDICATIONS STARTED DURING THIS VISIT:  New Prescriptions   No medications on file     Note:  This document was prepared using Dragon voice recognition software and may include unintentional dictation errors.    Nena Polio, MD 09/04/15 2002

## 2015-09-05 MED ORDER — DEXAMETHASONE 4 MG PO TABS
4.0000 mg | ORAL_TABLET | Freq: Every day | ORAL | Status: DC
  Administered 2015-09-05: 4 mg via ORAL
  Filled 2015-09-05: qty 1

## 2015-09-05 MED ORDER — DOXYCYCLINE HYCLATE 100 MG PO TABS
100.0000 mg | ORAL_TABLET | Freq: Every day | ORAL | Status: DC
  Administered 2015-09-05 – 2015-09-08 (×4): 100 mg via ORAL
  Filled 2015-09-05 (×4): qty 1

## 2015-09-05 MED ORDER — IPRATROPIUM-ALBUTEROL 0.5-2.5 (3) MG/3ML IN SOLN
3.0000 mL | RESPIRATORY_TRACT | Status: DC
Start: 2015-09-05 — End: 2015-09-05
  Administered 2015-09-05: 3 mL via RESPIRATORY_TRACT
  Filled 2015-09-05: qty 3

## 2015-09-05 NOTE — Progress Notes (Signed)
[facility] [place] NAME: Damonie Peardon    MRN#:  [id number]  DATE OF BIRTH:  06/22/1955  SUBJECTIVE:  Hospital Day: 1 day Shamyla Grennan is a 60 y.o. female presenting with Shortness of Breath .   Overnight events: no overnight events Interval Events: still with shortness of breath, cough  REVIEW OF SYSTEMS:  CONSTITUTIONAL: No fever, fatigue or weakness.  EYES: No blurred or double vision.  EARS, NOSE, AND THROAT: No tinnitus or ear pain.  RESPIRATORY: positive cough, shortness of breath, wheezing denies hemoptysis.  CARDIOVASCULAR: No chest pain, orthopnea, edema.  GASTROINTESTINAL: No nausea, vomiting, diarrhea or abdominal pain.  GENITOURINARY: No dysuria, hematuria.  ENDOCRINE: No polyuria, nocturia,  HEMATOLOGY: No anemia, easy bruising or bleeding SKIN: No rash or lesion. MUSCULOSKELETAL: No joint pain or arthritis.   NEUROLOGIC: No tingling, numbness, weakness.  PSYCHIATRY: No anxiety or depression.   DRUG ALLERGIES:   Allergies  Allergen Reactions  . Codeine Hives and Nausea And Vomiting  . Penicillins Hives, Itching and Other (See Comments)    Has patient had a PCN reaction causing immediate rash, facial/tongue/throat swelling, SOB or lightheadedness with hypotension: No Has patient had a PCN reaction causing severe rash involving mucus membranes or skin necrosis: No Has patient had a PCN reaction that required hospitalization No Has patient had a PCN reaction occurring within the last 10 years: No If all of the above answers are "NO", then may proceed with Cephalosporin use.  [name] Zithromax [Azithromycin] Other (See Comments)    Reaction:  Hallucinations   . Prednisone Palpitations and Other (See Comments)    Reaction:  Hallucinations     VITALS:  Blood pressure 123/61, pulse 77, temperature 97.7 F (36.5 C), temperature source Oral, resp. rate 20, height 5\' 2"  (1.575 m), weight 195 lb (88.451 kg), SpO2 97  %.  PHYSICAL EXAMINATION:  VITAL SIGNS: Filed Vitals:   09/05/15 0358 09/05/15 0804  BP: 131/64 123/61  Pulse: 84 77  Temp: 97.6 F (36.4 C) 97.7 F (36.5 C)  Resp: 16 20   GENERAL:59 y.o.female currently in no acute distress.  HEAD: Normocephalic, atraumatic.  EYES: Pupils equal, round, reactive to light. Extraocular muscles intact. No scleral icterus.  MOUTH: Moist mucosal membrane. Dentition intact. No abscess noted.  EAR, NOSE, THROAT: Clear without exudates. No external lesions.  NECK: Supple. No thyromegaly. No nodules. No JVD.  PULMONARY: diffuse expiratory wheeze without rails or rhonci. No use of accessory muscles, Good respiratory effort. good air entry bilaterally CHEST: Nontender to palpation.  CARDIOVASCULAR: S1 and S2. Regular rate and rhythm. No murmurs, rubs, or gallops. No edema. Pedal pulses 2+ bilaterally.  GASTROINTESTINAL: Soft, nontender, nondistended. No masses. Positive bowel sounds. No hepatosplenomegaly.  MUSCULOSKELETAL: No swelling, clubbing, or edema. Range of motion full in all extremities.  NEUROLOGIC: Cranial nerves II through XII are intact. No gross focal neurological deficits. Sensation intact. Reflexes intact.  SKIN: No ulceration, lesions, rashes, or cyanosis. Skin warm and dry. Turgor intact.  PSYCHIATRIC: Mood, affect within normal limits. The patient is awake, alert and oriented x 3. Insight, judgment intact.      LABORATORY PANEL:   CBC  Recent Labs Lab 09/04/15 1702  WBC 7.8  HGB 12.5  HCT 38.0  PLT 155   ------------------------------------------------------------------------------------------------------------------  Chemistries   Recent Labs Lab 09/04/15 1702  NA 141  K 4.4  CL 104  CO2 30  GLUCOSE 90  BUN 18  CREATININE 0.72  CALCIUM 8.5*  MG 1.9  AST 17  ALT 12*  ALKPHOS 57  BILITOT 0.4    ------------------------------------------------------------------------------------------------------------------  Cardiac Enzymes  Recent Labs Lab 09/04/15 1702  TROPONINI <0.03   ------------------------------------------------------------------------------------------------------------------  RADIOLOGY:  Dg Chest Portable 1 View  09/04/2015  CLINICAL DATA:  Short of breath, cough with phlegm EXAM: PORTABLE CHEST 1 VIEW COMPARISON:  08/25/2015 FINDINGS: Normal mediastinum and cardiac silhouette. Normal pulmonary vasculature. No evidence of effusion, infiltrate, or pneumothorax. No acute bony abnormality. IMPRESSION: No acute cardiopulmonary process. Electronically Signed   By: Suzy Bouchard M.D.   On: 09/04/2015 16:56    EKG:   Orders placed or performed during the hospital encounter of 09/04/15  . EKG 12-Lead  . EKG 12-Lead    ASSESSMENT AND PLAN:   Schnell Gavino is a 60 y.o. female presenting with Shortness of Breath . Admitted 09/04/2015 : Day #: 1 day 1. COPD exacerbation: Patient with chronic respiratory failure 2 L nasal cannula Baseline. We will start steroids as well as antibiotics for bacterial component of bronchitis/inflammation, change frequency of DuoNeb every 4 hours 2.Essential hypertension: Hydrochlorothiazide, losartan 3. Hypothyroidism unspecified Synthroid 4. Venous thromboembolism prophylactic: Lovenox  All the records are reviewed and case discussed with Care Management/Social Workerr. Management plans discussed with the patient, family and they are in agreement.  CODE STATUS: full TOTAL TIME TAKING CARE OF THIS PATIENT: 33 minutes.   POSSIBLE D/C IN 1-2DAYS, DEPENDING ON CLINICAL CONDITION.   Gladys Gutman,  [name] on 09/05/2015 at 1:13 PM  Between 7am to 6pm - Pager - [phone]  After 6pm: House Pager: - [facility]  Office  [phone]  CC: Primary care physician; [name], MD

## 2015-09-05 NOTE — Care Management Note (Signed)
Case Management Note  Patient Details  Name: Lauren Bartlett MRN: [id number] Date of Birth: 25-Aug-1955  Subjective/Objective:                   Met with patient to discuss discharge planning. Patient has had 3 admissions over the last 6 months for respiratory issues. I have requested COPD Gold and HRI Alvis Lemmings [facility] is agency at this time). Patient has chronic O2 through [place]- verified however patient has said Dr. Vella Kohler has asked [employer] to provide a shoulder bag which patient states she had been tested for. [facility] is still stating that they are the O2 provider. She states her PCP is DR. Tullo. She states she has trouble handling her groceries as they are cumbersome with her O2 needs. She lives alone. She has a rollator- no front-wheeled walker. She has neighbors that help her sometimes- she tries to manage on her own. She has no internet service. She denies problems obtaining meds.  Action/Plan: List of home health agencies left with patient. [facility] Lemmings is the home health agency covering for [facility]. Referral will need to be sent when patient agrees to HRI/HH. I talked to her about [place] or Kristopher Oppenheim arranging her grocery list and her picking up her groceries and maybe have a neighbor help get them into the house. She will check that out. RNCM will continue to follow.   Expected Discharge Date:                  Expected Discharge Plan:     In-House Referral:   (COPD Gold)  Discharge planning Services  CM Consult  Post Acute Care Choice:  Home Health, Durable Medical Equipment Choice offered to:  Patient  DME Arranged:  Oxygen DME Agency:  [facility]  HH Arranged:    [place] Agency:     Status of Service:  In process, will continue to follow  Medicare Important Message Given:    Date Medicare IM Given:    Medicare IM give by:    Date Additional Medicare IM Given:    Additional Medicare Important Message give by:     If discussed at [place] of Stay Meetings, dates discussed:     Additional Comments:  Marshell Garfinkel, RN 09/05/2015, 11:18 AM

## 2015-09-05 NOTE — Progress Notes (Signed)
Initial Nutrition Assessment  DOCUMENTATION CODES:   Obesity unspecified  INTERVENTION:   Cater to pt preferences to optimize nutritional intake Recommend supplement on follow up if intake inadequate   NUTRITION DIAGNOSIS:   Inadequate oral intake related to acute illness as evidenced by per patient/family report.  GOAL:   Patient will meet greater than or equal to 90% of their needs  MONITOR:   PO intake, Labs, Weight trends, I & O's  REASON FOR ASSESSMENT:   Consult COPD Protocol  ASSESSMENT:   Pt admitted with COPD exacerbation.  Past Medical History  Diagnosis Date  . Congenital absence of one kidney   . History of sciatica   . Depression     treated at [place]  . Hypertension   . hypothyrodism   . COPD (chronic obstructive pulmonary disease) ([place])   . Anxiety   . Emphysema/COPD [facility])     Diet Order:  Diet Heart Room service appropriate?: Yes; Fluid consistency:: Thin   Pt reports her appetite has improved since admission. Pt reports having poor po intake fore  The past 2 days PTA.   Pt reports usually with a good appetite. Pt reports she makes her own meals usually a fried egg for breakfast, a 'Smart' choice frozen meal for lunch or sometimes she goes out and then usually the same for dinner a frozen meal. Pt reports she usually eats dinner at 4:30 and tries not to eat anything after.   Medications: reviewed Labs: reviewed   Gastrointestinal Profile: Last BM:  09/05/2015   Nutrition-Focused Physical Exam Findings: Nutrition-Focused physical exam completed. Findings are WDL for fat depletion, muscle depletion, and edema.    Weight Change: Pt reports intentionally trying to lose weight for the past 5 years as she used to weigh 264lbs 5 years ago. Pt reports she has been 195lbs recently and it has been stable.    Skin:  Reviewed, no issues   Height:   Ht Readings from Last 1 Encounters:  09/04/15 5\' 2"  (1.575 m)    Weight:   Wt  Readings from Last 1 Encounters:  09/04/15 195 lb (88.451 kg)    Ideal Body Weight:   50kg  BMI:  Body mass index is 35.66 kg/(m^2).  Estimated Nutritional Needs:   Kcal:  1500-1750kcals  Protein:  60-75g protein  Fluid:  >/= 1.5L fluid  EDUCATION NEEDS:   No education needs identified at this time  Dwyane Luo, RD, LDN Pager [phone] Weekend/On-Call Pager [phone]

## 2015-09-05 NOTE — Evaluation (Signed)
Physical Therapy Evaluation Patient Details Name: Lauren Bartlett MRN: [id number] DOB: [birth year] Today's Date: 09/05/2015   History of Present Illness  Pt admitted for COPD exacerbation. Pt with history of CRF, COPD, HTN, emphysema. Pt wears chronic 2L of O2 at home.  Clinical Impression  Pt demonstrates all bed mobility/transfers/ambulation at baseline level. Pt does not require any further PT needs at this time. Pt will be dc in house and does not require follow up. RN aware. Will dc current orders. Sats remained WNL during mobility with 2L of O2 donned.     Follow Up Recommendations No PT follow up    Equipment Recommendations       Recommendations for Other Services       Precautions / Restrictions Precautions Precautions: Fall Restrictions Weight Bearing Restrictions: No      Mobility  Bed Mobility Overal bed mobility: Independent             General bed mobility comments: safe technique performed  Transfers Overall transfer level: Independent Equipment used: Rolling walker (2 wheeled)             General transfer comment: RW used with safe technique  Ambulation/Gait Ambulation/Gait assistance: Modified independent (Device/Increase time) Ambulation Distance (Feet): 200 Feet Assistive device: Rolling walker (2 wheeled) Gait Pattern/deviations: Step-through pattern     General Gait Details: ambulated using rw with reciprocal gait pattern. Safe technique performed with rw with pt able to carry conversation during ambulation. Pt performed mobility on 2L of O2 with sats WNL  Stairs            Wheelchair Mobility    Modified Rankin (Stroke Patients Only)       Balance Overall balance assessment: Independent                                           Pertinent Vitals/Pain Pain Assessment: No/denies pain    Home Living Family/patient expects to be discharged to:: Private residence Living Arrangements: Alone   Type of Home:  Apartment Home Access: Level entry     Home Layout: One level Home Equipment: Walker - 4 wheels      Prior Function Level of Independence: Independent with assistive device(s)         Comments: Pt reports no falls and was independent with AD     Hand Dominance        Extremity/Trunk Assessment   Upper Extremity Assessment: Overall WFL for tasks assessed           Lower Extremity Assessment: Overall WFL for tasks assessed         Communication   Communication: No difficulties  Cognition Arousal/Alertness: Awake/alert Behavior During Therapy: WFL for tasks assessed/performed Overall Cognitive Status: Within Functional Limits for tasks assessed                      General Comments      Exercises Other Exercises Other Exercises: Pt ambulated to bathroom with independence using RW. Safe technique performed. Pt able to perform self hygiene with safe technique.      Assessment/Plan    PT Assessment Patent does not need any further PT services  PT Diagnosis     PT Problem List    PT Treatment Interventions     PT Goals (Current goals can be found in the Care Plan section) Acute Rehab PT  Goals Patient Stated Goal: to get back home PT Goal Formulation: All assessment and education complete, DC therapy Time For Goal Achievement: September 21, 2015 Potential to Achieve Goals: Good    Frequency     Barriers to discharge        Co-evaluation               End of Session Equipment Utilized During Treatment: Gait belt;Oxygen Activity Tolerance: Patient tolerated treatment well Patient left: in chair Nurse Communication: Mobility status         Time: [id number] PT Time Calculation (min) (ACUTE ONLY): 20 min   Charges:   PT Evaluation $PT Eval Low Complexity: 1 Procedure PT Treatments $Therapeutic Activity: 8-22 mins   PT G Codes:        Samera Macy 09-21-2015, 5:01 PM  Greggory Stallion, PT, DPT [phone]

## 2015-09-06 LAB — GLUCOSE, CAPILLARY: Glucose-Capillary: 91 mg/dL (ref 65–99)

## 2015-09-06 MED ORDER — SODIUM CHLORIDE 0.9 % IV BOLUS (SEPSIS)
500.0000 mL | Freq: Once | INTRAVENOUS | Status: AC
  Administered 2015-09-06: 500 mL via INTRAVENOUS

## 2015-09-06 NOTE — Evaluation (Signed)
Occupational Therapy Evaluation Patient Details Name: Zalayah Roseboom MRN: [id number] DOB: 1955-08-01 Today's Date: 09/06/2015    History of Present Illness Pt admitted for COPD exacerbation. Pt with history of CRF, COPD, HTN, emphysema. Pt wears chronic 2L of O2 at home.   Clinical Impression   Pt is 60 year old woman who presents to [facility] hospital with COPD exacerbation.  Pt is currently at baseline for ADLs and has a shower chair she uses for bathing.  She also has grab bars by her toilet and in shower to increase safety and prevent falls. Pt has decreased functional endurance for ADLs and educated in energy conservation tech with a handout provided.  She was also educated in other AE to assist her with ADLs.  Pt could benefit from a [place] aide to help with light house keeping since she lives alone and reports difficulty keeping up with cleaning, laundry, etc.  Discussed Meals on Wheels when she turns 60 and curbside grocery pick up at [employer].  No further OT needed since she is at baseline.  Levada Dy from SW updated about recommendations.         Follow Up Recommendations  No OT follow up (Rec a HH aide to help with light house keeping)    Equipment Recommendations       Recommendations for Other Services       Precautions / Restrictions Precautions Precautions: Fall Restrictions Weight Bearing Restrictions: No      Mobility Bed Mobility                  Transfers                      Balance                                            ADL Overall ADL's : At baseline                                       General ADL Comments: Pt is currently at baseline for ADLs and was educated with handout for energy conserv tech to use during ADLs.  Rec that she have assistance from an aide to help with house work, cleaning, meal prep, laundry, etc since her functional endurance is limited from COPD and fluctuates.  Also discussed using  Meals on Wheels once she is 60 years old and the grocery service at [employer] where she can order groceries and just pick them up curbside. Discussed rec with Levada Dy from SW.     Vision     Perception     Praxis      Pertinent Vitals/Pain Pain Assessment: No/denies pain     Hand Dominance Right   Extremity/Trunk Assessment Upper Extremity Assessment Upper Extremity Assessment: Overall WFL for tasks assessed   Lower Extremity Assessment Lower Extremity Assessment: Defer to PT evaluation       Communication Communication Communication: No difficulties   Cognition Arousal/Alertness: Awake/alert Behavior During Therapy: WFL for tasks assessed/performed;Flat affect Overall Cognitive Status: Within Functional Limits for tasks assessed                     General Comments       Exercises  Shoulder Instructions      Home Living Family/patient expects to be discharged to:: Private residence Living Arrangements: Alone   Type of Home: Apartment Home Access: Level entry     Home Layout: One level     Bathroom Shower/Tub: Walk-in Hydrologist: Standard Bathroom Accessibility: Yes How Accessible: Accessible via walker Home Equipment: [place] - 4 wheels;Shower seat;Grab bars - tub/shower;Grab bars - toilet          Prior Functioning/Environment Level of Independence: Independent with assistive device(s)        Comments: Pt reports no falls and was independent with AD    OT Diagnosis:     OT Problem List:     OT Treatment/Interventions:      OT Goals(Current goals can be found in the care plan section)    OT Frequency:     Barriers to D/C:            Co-evaluation              End of Session    Activity Tolerance: Patient tolerated treatment well Patient left: in bed;with call bell/phone within reach;with bed alarm set   Time: [id number] OT Time Calculation (min): 42 min Charges:  OT General  Charges $OT Visit: 1 Procedure OT Evaluation $OT Eval Low Complexity: 1 Procedure OT Treatments $Self Care/Home Management : 23-37 mins G-Codes:     Chrys Racer, OTR/L ascom [phone]  09/06/2015, 1:25 PM

## 2015-09-06 NOTE — Progress Notes (Signed)
Pts BP 86/50. MD [name] notified. Order received for 500 ml/hr NS bolus. Order placed.

## 2015-09-06 NOTE — Progress Notes (Signed)
[facility] [place] [facility] [place] NAME: Lauren Bartlett    MRN#:  [id number]  DATE OF BIRTH:  [birth year]  SUBJECTIVE:  Hospital Day: 2 days Lauren Bartlett is a 60 y.o. female presenting with Shortness of Breath .   Overnight events: no overnight events Interval Events: improving  shortness of breath, cough  REVIEW OF SYSTEMS:  CONSTITUTIONAL: No fever, fatigue or weakness.  EYES: No blurred or double vision.  EARS, NOSE, AND THROAT: No tinnitus or ear pain.  RESPIRATORY: positive cough, shortness of breath, wheezing denies hemoptysis.  CARDIOVASCULAR: No chest pain, orthopnea, edema.  GASTROINTESTINAL: positive nausea, denies vomiting, diarrhea or abdominal pain.  GENITOURINARY: No dysuria, hematuria.  ENDOCRINE: No polyuria, nocturia,  HEMATOLOGY: No anemia, easy bruising or bleeding SKIN: No rash or lesion. MUSCULOSKELETAL: No joint pain or arthritis.   NEUROLOGIC: No tingling, numbness, weakness.  PSYCHIATRY: No anxiety or depression.   DRUG ALLERGIES:   Allergies  Allergen Reactions  . Codeine Hives and Nausea And Vomiting  . Penicillins Hives, Itching and Other (See Comments)    Has patient had a PCN reaction causing immediate rash, facial/tongue/throat swelling, SOB or lightheadedness with hypotension: No Has patient had a PCN reaction causing severe rash involving mucus membranes or skin necrosis: No Has patient had a PCN reaction that required hospitalization No Has patient had a PCN reaction occurring within the last 10 years: No If all of the above answers are "NO", then may proceed with Cephalosporin use.  [name] Zithromax [Azithromycin] Other (See Comments)    Reaction:  Hallucinations   . Prednisone Palpitations and Other (See Comments)    Reaction:  Hallucinations     VITALS:  Blood pressure 123/62, pulse 65, temperature 97.5 F (36.4 C), temperature source Oral, resp. rate 16, height 5\' 2"  (1.575 m), weight 195 lb (88.451 kg),  SpO2 99 %.  PHYSICAL EXAMINATION:  VITAL SIGNS: Filed Vitals:   09/06/15 0755 09/06/15 0850  BP: 114/56 123/62  Pulse: 65   Temp: 97.5 F (36.4 C)   Resp: 16    GENERAL:59 y.o.female currently in no acute distress.  HEAD: Normocephalic, atraumatic.  EYES: Pupils equal, round, reactive to light. Extraocular muscles intact. No scleral icterus.  MOUTH: Moist mucosal membrane. Dentition intact. No abscess noted.  EAR, NOSE, THROAT: Clear without exudates. No external lesions.  NECK: Supple. No thyromegaly. No nodules. No JVD.  PULMONARY: improving expiratory wheeze without rails or rhonci. No use of accessory muscles, Good respiratory effort. good air entry bilaterally CHEST: Nontender to palpation.  CARDIOVASCULAR: S1 and S2. Regular rate and rhythm. No murmurs, rubs, or gallops. No edema. Pedal pulses 2+ bilaterally.  GASTROINTESTINAL: Soft, nontender, nondistended. No masses. Positive bowel sounds. No hepatosplenomegaly.  MUSCULOSKELETAL: No swelling, clubbing, or edema. Range of motion full in all extremities.  NEUROLOGIC: Cranial nerves II through XII are intact. No gross focal neurological deficits. Sensation intact. Reflexes intact.  SKIN: No ulceration, lesions, rashes, or cyanosis. Skin warm and dry. Turgor intact.  PSYCHIATRIC: Mood, affect within normal limits. The patient is awake, alert and oriented x 3. Insight, judgment intact.      LABORATORY PANEL:   CBC  Recent Labs Lab 09/04/15 1702  WBC 7.8  HGB 12.5  HCT 38.0  PLT 155   ------------------------------------------------------------------------------------------------------------------  Chemistries   Recent Labs Lab 09/04/15 1702  NA 141  K 4.4  CL 104  CO2 30  GLUCOSE 90  BUN 18  CREATININE 0.72  CALCIUM 8.5*  MG  1.9  AST 17  ALT 12*  ALKPHOS 57  BILITOT 0.4   ------------------------------------------------------------------------------------------------------------------  Cardiac  Enzymes  Recent Labs Lab 09/04/15 1702  TROPONINI <0.03   ------------------------------------------------------------------------------------------------------------------  RADIOLOGY:  Dg Chest Portable 1 View  09/04/2015  CLINICAL DATA:  Short of breath, cough with phlegm EXAM: PORTABLE CHEST 1 VIEW COMPARISON:  08/25/2015 FINDINGS: Normal mediastinum and cardiac silhouette. Normal pulmonary vasculature. No evidence of effusion, infiltrate, or pneumothorax. No acute bony abnormality. IMPRESSION: No acute cardiopulmonary process. Electronically Signed   By: Suzy Bouchard M.D.   On: 09/04/2015 16:56    EKG:   Orders placed or performed during the hospital encounter of 09/04/15  . EKG 12-Lead  . EKG 12-Lead    ASSESSMENT AND PLAN:   Lauren Bartlett is a 60 y.o. female presenting with Shortness of Breath . Admitted 09/04/2015 : Day #: 2 days 1. COPD exacerbation: Patient with chronic respiratory failure 2 L nasal cannula Baseline. Hold steroids  - patients states couldnot tolerate antibiotics for bacterial component of bronchitis/inflammation, change frequency of DuoNeb every 4 hours 2.Essential hypertension: Hydrochlorothiazide, losartan 3. Hypothyroidism unspecified Synthroid 4. Venous thromboembolism prophylactic: Lovenox  All the records are reviewed and case discussed with Care Management/Social Workerr. Management plans discussed with the patient, family and they are in agreement.  CODE STATUS: full TOTAL TIME TAKING CARE OF THIS PATIENT: 33 minutes.   POSSIBLE D/C IN 1-2DAYS, DEPENDING ON CLINICAL CONDITION.   [name],  [name] on 09/06/2015 at 1:41 PM  Between 7am to 6pm - Pager - [phone]  After 6pm: House Pager: - [phone]  [facility]  Office  [phone]  CC: Primary care physician; [name], MD

## 2015-09-06 NOTE — Care Management Important Message (Signed)
Important Message  Patient Details  Name: [name] MRN: [id number] Date of Birth: [birth year]   Medicare Important Message Given:  Yes    Shelbie Ammons, RN 09/06/2015, 8:12 AM

## 2015-09-06 NOTE — Progress Notes (Signed)
Pt's BP 122/64 after fluid bolus.

## 2015-09-06 NOTE — Care Management (Signed)
No recommendations for HHPT per PT assessment. Patient may benefit from [facility]; MD please assess.

## 2015-09-06 NOTE — Care Management (Signed)
Home health referral for [facility] sent to [facility] home health after speaking with patient. She would benefit also from CNA.

## 2015-09-07 LAB — CREATININE, SERUM
CREATININE: 0.69 mg/dL (ref 0.44–1.00)
GFR calc Af Amer: >60 mL/min (ref >60)

## 2015-09-07 NOTE — Progress Notes (Signed)
Lauren Bartlett at [place] NAME: Lauren Bartlett    MRN#:  [id number]  DATE OF BIRTH:  Jul 02, 1955  SUBJECTIVE:  Hospital Day: 3 days Lauren Bartlett is a 60 y.o. female presenting with Shortness of Breath .   Overnight events: no overnight events Interval Events:Hypotensive this morning continued complaint of shortness of breath mainly now on exertion  REVIEW OF SYSTEMS:  CONSTITUTIONAL: No fever, fatigue or weakness.  EYES: No blurred or double vision.  EARS, NOSE, AND THROAT: No tinnitus or ear pain.  RESPIRATORY: positive cough, shortness of breath, wheezing denies hemoptysis.  CARDIOVASCULAR: No chest pain, orthopnea, edema.  GASTROINTESTINAL: positive nausea, denies vomiting, diarrhea or abdominal pain.  GENITOURINARY: No dysuria, hematuria.  ENDOCRINE: No polyuria, nocturia,  HEMATOLOGY: No anemia, easy bruising or bleeding SKIN: No rash or lesion. MUSCULOSKELETAL: No joint pain or arthritis.   NEUROLOGIC: No tingling, numbness, weakness.  PSYCHIATRY: No anxiety or depression.   DRUG ALLERGIES:   Allergies  Allergen Reactions  . Codeine Hives and Nausea And Vomiting  . Penicillins Hives, Itching and Other (See Comments)    Has patient had a PCN reaction causing immediate rash, facial/tongue/throat swelling, SOB or lightheadedness with hypotension: No Has patient had a PCN reaction causing severe rash involving mucus membranes or skin necrosis: No Has patient had a PCN reaction that required hospitalization No Has patient had a PCN reaction occurring within the last 10 years: No If all of the above answers are "NO", then may proceed with Cephalosporin use.  [name] Zithromax [Azithromycin] Other (See Comments)    Reaction:  Hallucinations   . Prednisone Palpitations and Other (See Comments)    Reaction:  Hallucinations     VITALS:  Blood pressure 119/69, pulse 77, temperature 97.9 F (36.6 C), temperature source Oral, resp. rate 18,  height 5\' 2"  (1.575 m), weight 195 lb (88.451 kg), SpO2 100 %.  PHYSICAL EXAMINATION:  VITAL SIGNS: Filed Vitals:   09/07/15 0434 09/07/15 0825  BP: 108/57 119/69  Pulse: 68 77  Temp: 97.7 F (36.5 C) 97.9 F (36.6 C)  Resp: 19 18   GENERAL:59 y.o.female currently in no acute distress.  HEAD: Normocephalic, atraumatic.  EYES: Pupils equal, round, reactive to light. Extraocular muscles intact. No scleral icterus.  MOUTH: Moist mucosal membrane. Dentition intact. No abscess noted.  EAR, NOSE, THROAT: Clear without exudates. No external lesions.  NECK: Supple. No thyromegaly. No nodules. No JVD.  PULMONARY: Minimal expiratory wheeze without rails or rhonci. No use of accessory muscles, Good respiratory effort. good air entry bilaterally CHEST: Nontender to palpation.  CARDIOVASCULAR: S1 and S2. Regular rate and rhythm. No murmurs, rubs, or gallops. No edema. Pedal pulses 2+ bilaterally.  GASTROINTESTINAL: Soft, nontender, nondistended. No masses. Positive bowel sounds. No hepatosplenomegaly.  MUSCULOSKELETAL: No swelling, clubbing, or edema. Range of motion full in all extremities.  NEUROLOGIC: Cranial nerves II through XII are intact. No gross focal neurological deficits. Sensation intact. Reflexes intact.  SKIN: No ulceration, lesions, rashes, or cyanosis. Skin warm and dry. Turgor intact.  PSYCHIATRIC: Mood, affect within normal limits. The patient is awake, alert and oriented x 3. Insight, judgment intact.      LABORATORY PANEL:   CBC  Recent Labs Lab 09/04/15 1702  WBC 7.8  HGB 12.5  HCT 38.0  PLT 155   ------------------------------------------------------------------------------------------------------------------  Chemistries   Recent Labs Lab 09/04/15 1702 09/07/15 0512  NA 141  --   K 4.4  --   CL 104  --  CO2 30  --   GLUCOSE 90  --   BUN 18  --   CREATININE 0.72 0.69  CALCIUM 8.5*  --   MG 1.9  --   AST 17  --   ALT 12*  --   ALKPHOS 57  --    BILITOT 0.4  --    ------------------------------------------------------------------------------------------------------------------  Cardiac Enzymes  Recent Labs Lab 09/04/15 1702  TROPONINI <0.03   ------------------------------------------------------------------------------------------------------------------  RADIOLOGY:  No results found.  EKG:   Orders placed or performed during the hospital encounter of 09/04/15  . EKG 12-Lead  . EKG 12-Lead    ASSESSMENT AND PLAN:   Fay Glatt is a 60 y.o. female presenting with Shortness of Breath . Admitted 09/04/2015 : Day #: 3 days 1. COPD exacerbation: Patient with chronic respiratory failure 2 L nasal cannula Baseline. Hold steroids  - patients states couldnot tolerate, antibiotics for bacterial component of bronchitis/inflammation, change frequency of DuoNeb every 4 hours 2.Essential hypertension: Hydrochlorothiazide, losartan 3. Hypothyroidism unspecified Synthroid 4. Venous thromboembolism prophylactic: Lovenox  All the records are reviewed and case discussed with Care Management/Social Workerr. Management plans discussed with the patient, family and they are in agreement.  CODE STATUS: full TOTAL TIME TAKING CARE OF THIS PATIENT: 28 minutes.   POSSIBLE D/C IN 1-2DAYS, DEPENDING ON CLINICAL CONDITION.   [name],  Karenann Bartlett.D on 09/07/2015 at 1:55 PM  Between 7am to 6pm - Pager - [phone]  After 6pm: House Pager: - [place] [facility]  Office  [phone]  CC: Primary care physician; [name], MD

## 2015-09-08 MED ORDER — DOXYCYCLINE HYCLATE 100 MG PO TABS: 100.0000 mg | ORAL_TABLET | Freq: Two times a day (BID) | ORAL | Status: DC

## 2015-09-08 NOTE — Care Management Note (Signed)
Case Management Note  Patient Details  Name: Lauren Bartlett MRN: [id number] Date of Birth: [birth year]  Subjective/Objective:      A referral for home health [place] called to [facility] at [facility] for RN and Nurse Aide.               Action/Plan:   Expected Discharge Date:                  Expected Discharge Plan:     In-House Referral:   (COPD Gold)  Discharge planning Services  CM Consult  Post Acute Care Choice:  Home Health, Durable Medical Equipment Choice offered to:  Patient  DME Arranged:  Oxygen DME Agency:  [facility]  HH Arranged:    [place] Agency:     Status of Service:  In process, will continue to follow  Medicare Important Message Given:  Yes Date Medicare IM Given:    Medicare IM give by:    Date Additional Medicare IM Given:    Additional Medicare Important Message give by:     If discussed at [place] of Stay Meetings, dates discussed:    Additional Comments:  Torez Beauregard A, RN 09/08/2015, 9:15 AM

## 2015-09-08 NOTE — Discharge Summary (Signed)
[place] [facility] [place] NAME: Lauren Bartlett    MR#:  [id number]  DATE OF BIRTH:  April 07, 1955  DATE OF ADMISSION:  09/04/2015 ADMITTING PHYSICIAN: Lauren Loll, MD  DATE OF DISCHARGE: 09/08/2015 10:35 AM  PRIMARY CARE PHYSICIAN: Lauren Mc, MD    ADMISSION DIAGNOSIS:  COPD exacerbation ([place]) [J44.1]  DISCHARGE DIAGNOSIS:  Principal Problem:   COPD exacerbation ([place])   SECONDARY DIAGNOSIS:   Past Medical History  Diagnosis Date  . Congenital absence of one kidney   . History of sciatica   . Depression     treated at [place]  . Hypertension   . hypothyrodism   . COPD (chronic obstructive pulmonary disease) ([place])   . Anxiety   . Emphysema/COPD [facility])     HOSPITAL COURSE:   1. COPD exacerbation. Patient given nebulizer treatments and budesonide nebulizers and doxycycline. I will finish up a doxycycline course. Patient can go back on her Flovent inhaler as outpatient. Continue her usual nebulizers and inhalers at home. 2. Chronic respiratory failure continue oxygen supplementation 2 L nasal cannula 24/7. 3. Essential hypertension continue usual medications 4. Hypothyroidism unspecified continue Synthroid  DISCHARGE CONDITIONS:   Satisfactory  CONSULTS OBTAINED:  None  DRUG ALLERGIES:   Allergies  Allergen Reactions  . Codeine Hives and Nausea And Vomiting  . Penicillins Hives, Itching and Other (See Comments)    Has patient had a PCN reaction causing immediate rash, facial/tongue/throat swelling, SOB or lightheadedness with hypotension: No Has patient had a PCN reaction causing severe rash involving mucus membranes or skin necrosis: No Has patient had a PCN reaction that required hospitalization No Has patient had a PCN reaction occurring within the last 10 years: No If all of the above answers are "NO", then may proceed with Cephalosporin use.  [name] Zithromax [Azithromycin] Other (See Comments)    Reaction:  Hallucinations    . Prednisone Palpitations and Other (See Comments)    Reaction:  Hallucinations     DISCHARGE MEDICATIONS:   Discharge Medication List as of 09/08/2015  9:57 AM    START taking these medications   Details  doxycycline (VIBRA-TABS) 100 MG tablet Take 1 tablet (100 mg total) by mouth 2 (two) times daily., Starting 09/08/2015, Until Discontinued, Print      CONTINUE these medications which have NOT CHANGED   Details  albuterol (PROVENTIL HFA;VENTOLIN HFA) 108 (90 Base) MCG/ACT inhaler Inhale 2 puffs into the lungs every 6 (six) hours as needed for wheezing or shortness of breath. Pt uses with SPACER., Until Discontinued, Historical Med    fluticasone (FLONASE) 50 MCG/ACT nasal spray Place 2 sprays into both nostrils daily., Until Discontinued, Historical Med    fluticasone (FLOVENT HFA) 110 MCG/ACT inhaler Inhale 1 puff into the lungs 2 (two) times daily., Until Discontinued, Historical Med    hydrochlorothiazide (HYDRODIURIL) 25 MG tablet Take 25 mg by mouth daily., Until Discontinued, Historical Med    ipratropium-albuterol (DUONEB) 0.5-2.5 (3) MG/3ML SOLN Take 3 mLs by nebulization every 4 (four) hours as needed (for wheezing/shortness of breath)., Until Discontinued, Historical Med    levothyroxine (SYNTHROID, LEVOTHROID) 112 MCG tablet Take 112 mcg by mouth daily before breakfast., Until Discontinued, Historical Med    loratadine-pseudoephedrine (CLARITIN-D 24-HOUR) 10-240 MG 24 hr tablet Take 1 tablet by mouth daily., Until Discontinued, Historical Med    losartan (COZAAR) 100 MG tablet Take 1 tablet (100 mg total) by mouth daily., Starting 05/28/2015, Until Discontinued, Normal    montelukast (  SINGULAIR) 10 MG tablet Take 1 tablet (10 mg total) by mouth at bedtime., Starting 06/05/2015, Until Discontinued, Normal    Umeclidinium-Vilanterol (ANORO ELLIPTA) 62.5-25 MCG/INH AEPB Inhale 1 puff into the lungs daily., Until Discontinued, Historical Med    Vitamin D, Ergocalciferol,  (DRISDOL) 50000 units CAPS capsule Take 50,000 Units by mouth every 7 (seven) days. Pt takes on Saturday., Until Discontinued, Historical Med    polyethylene glycol powder (GLYCOLAX/MIRALAX) powder Take 17 g by mouth 2 (two) times daily as needed., Starting 08/03/2015, Until Discontinued, Normal      STOP taking these medications     dextromethorphan (DELSYM) 30 MG/5ML liquid          DISCHARGE INSTRUCTIONS:   Follow-up PMD one week  If you experience worsening of your admission symptoms, develop shortness of breath, life threatening emergency, suicidal or homicidal thoughts you must seek medical attention immediately by calling 911 or calling your MD immediately  if symptoms less severe.  You Must read complete instructions/literature along with all the possible adverse reactions/side effects for all the Medicines you take and that have been prescribed to you. Take any new Medicines after you have completely understood and accept all the possible adverse reactions/side effects.   Please note  You were cared for by a hospitalist during your hospital stay. If you have any questions about your discharge medications or the care you received while you were in the hospital after you are discharged, you can call the unit and asked to speak with the hospitalist on call if the hospitalist that took care of you is not available. Once you are discharged, your primary care physician will handle any further medical issues. Please note that NO REFILLS for any discharge medications will be authorized once you are discharged, as it is imperative that you return to your primary care physician (or establish a relationship with a primary care physician if you do not have one) for your aftercare needs so that they can reassess your need for medications and monitor your lab values.    Today   CHIEF COMPLAINT:   Chief Complaint  Patient presents with  . Shortness of Breath    HISTORY OF PRESENT ILLNESS:   Lauren Bartlett  is a 60 y.o. female presented with shortness of breath and found to be in COPD exacerbation   VITAL SIGNS:  Blood pressure 119/65, pulse 74, temperature 98.4 F (36.9 C), temperature source Oral, resp. rate 18, height 5\' 2"  (1.575 m), weight 88.451 kg (195 lb), SpO2 98 %.    PHYSICAL EXAMINATION:  GENERAL:  60 y.o.-year-old patient lying in the bed with no acute distress.  EYES: Pupils equal, round, reactive to light and accommodation. No scleral icterus. Extraocular muscles intact.  HEENT: Head atraumatic, normocephalic. Oropharynx and nasopharynx clear.  NECK:  Supple, no jugular venous distention. No thyroid enlargement, no tenderness.  LUNGS: Decreased breath sounds bilaterally, no wheezing, rales,rhonchi or crepitation. No use of accessory muscles of respiration.  CARDIOVASCULAR: S1, S2 normal. No murmurs, rubs, or gallops.  ABDOMEN: Soft, non-tender, non-distended. Bowel sounds present. No organomegaly or mass.  EXTREMITIES: No pedal edema, cyanosis, or clubbing.  NEUROLOGIC: Cranial nerves II through XII are intact. Muscle strength 5/5 in all extremities. Sensation intact. Gait not checked.  PSYCHIATRIC: The patient is alert and oriented x 3.  SKIN: No obvious rash, lesion, or ulcer.   DATA REVIEW:   CBC  Recent Labs Lab 09/04/15 1702  WBC 7.8  HGB 12.5  HCT 38.0  PLT 155    Chemistries   Recent Labs Lab 09/04/15 1702 09/07/15 0512  NA 141  --   K 4.4  --   CL 104  --   CO2 30  --   GLUCOSE 90  --   BUN 18  --   CREATININE 0.72 0.69  CALCIUM 8.5*  --   MG 1.9  --   AST 17  --   ALT 12*  --   ALKPHOS 57  --   BILITOT 0.4  --     Cardiac Enzymes  Recent Labs Lab 09/04/15 1702  TROPONINI <0.03    Management plans discussed with the patient, family and they are in agreement.  CODE STATUS:  Code Status History    Date Active Date Inactive Code Status Order ID Comments User Context   09/04/2015 10:25 PM 09/08/2015  1:36 PM Full Code  [id number]  Lauren Loll, MD Inpatient   07/27/2015  6:07 PM 08/01/2015  6:43 PM Full Code [id number]  Lytle Butte, MD ED   06/03/2015  1:42 PM 06/05/2015  5:01 PM Full Code [id number]  Idelle Crouch, MD Inpatient   02/16/2015  9:36 AM 02/18/2015  4:33 PM Full Code [id number]  Fritzi Mandes, MD Inpatient   02/03/2015  2:32 AM 02/05/2015  4:34 PM Full Code [id number]  Lytle Butte, MD ED      TOTAL TIME TAKING CARE OF THIS PATIENT: 35 minutes.    Loletha Grayer M.D on 09/08/2015 at 4:21 PM  Between 7am to 6pm - Pager - [phone]  After 6pm go to [email or web address] - password [email or web address]  [employer] Office  [phone]  CC: Primary care physician; Lauren Mc, MD

## 2015-09-08 NOTE — Progress Notes (Signed)
Discharge instructions reviewed with the pt.  rx given to the pt. Pt sent out via wheelchair with her oxygen to her car.

## 2015-09-08 NOTE — Discharge Instructions (Signed)

## 2015-09-08 NOTE — Clinical Social Work Note (Signed)
Clinical Social Work Assessment  Patient Details  Name: Lauren Bartlett MRN: [id number] Date of Birth: 12/18/1955  Date of referral:  09/08/15               Reason for consult:  Other (Comment Required) (COPD Gold)                Permission sought to share information with:    Permission granted to share information::     Name::        Agency::     Relationship::     Contact Information:     Housing/Transportation Living arrangements for the past 2 months:  Single Family Home Source of Information:  Patient Patient Interpreter Needed:  None Criminal Activity/Legal Involvement Pertinent to Current Situation/Hospitalization:  No - Comment as needed Significant Relationships:  None Lives with:  Self Do you feel safe going back to the place where you live?  Yes Need for family participation in patient care:  No (Coment)  Care giving concerns:  COPD Gold    Facilities manager / plan:  CSW received consult for COPD Gold. CSW met with patient at bedside. CSW introduced herself and her role. Per patient she's been diagnosed with anxiety for "a long time". Reports that she had anxiety before she was diagnosed with COPD. Per patient she has a therapist that she sees weekly. Reports that she does not take medication for her anxiety because she has learned to cope through deep breathing techniques and talking to herself. Patient declined needing Mental Health resources for [facility] at this time. There are no other CSW needs at this time. CSW is signing off but is available if a need were to arise.   Employment status:  Retired Forensic scientist:  Medicare PT Recommendations:  Not assessed at this time [place] / Referral to community resources:     Patient/Family's Response to care:  Patient reports that she receives outpatient therapy weekly and did not need [employer].   Patient/Family's Understanding of and Emotional Response to Diagnosis,  Current Treatment, and Prognosis:  Patient understands why CSW came to speak to her and appreciated CSW's assistance.   Emotional Assessment Appearance:  Appears stated age Attitude/Demeanor/Rapport:   (None) Affect (typically observed):  Calm, Pleasant Orientation:  Oriented to Self, Oriented to Place, Oriented to  Time, Oriented to Situation Alcohol / Substance use:  Not Applicable Psych involvement (Current and /or in the community):  No (Comment)  Discharge Needs  Concerns to be addressed:  Discharge Planning Concerns, No discharge needs identified Readmission within the last 30 days:  No Current discharge risk:  None Barriers to Discharge:  No Barriers Identified   [place], LCSW 09/08/2015, 9:37 AM

## 2015-09-09 DIAGNOSIS — I10 Essential (primary) hypertension: Secondary | ICD-10-CM | POA: Diagnosis not present

## 2015-09-09 DIAGNOSIS — J441 Chronic obstructive pulmonary disease with (acute) exacerbation: Secondary | ICD-10-CM | POA: Diagnosis not present

## 2015-09-10 ENCOUNTER — Telehealth: Payer: Self-pay | Admitting: Internal Medicine

## 2015-09-10 DIAGNOSIS — I10 Essential (primary) hypertension: Secondary | ICD-10-CM | POA: Diagnosis not present

## 2015-09-10 DIAGNOSIS — J441 Chronic obstructive pulmonary disease with (acute) exacerbation: Secondary | ICD-10-CM | POA: Diagnosis not present

## 2015-09-10 NOTE — Telephone Encounter (Signed)
Verbal orders given to Heather 

## 2015-09-10 NOTE — Telephone Encounter (Signed)
Lauren Bartlett [phone] called from [facility] care regarding needing a verbal order for a Education officer, museum.   It's ok leave vm on Lauren Bartlett phone. Thank you!

## 2015-09-10 NOTE — Telephone Encounter (Signed)
yes

## 2015-09-10 NOTE — Telephone Encounter (Signed)
Okay for verbal order

## 2015-09-11 ENCOUNTER — Telehealth: Payer: Self-pay

## 2015-09-11 NOTE — Telephone Encounter (Signed)
Transition Care Management Follow-up Telephone Call   Date discharged? 09/08/15   How have you been since you were released from the hospital? I AM DOING BETTER.  NO COUGH. NO SOB.  OXYGEN @ 2L. EATING/DRINKING WITHOUT ISSUES.  I AM USING THE NEBULIZER AND INHALERS EVERY 6 HOURS OR AS NEEDED. NO PAIN.     Do you understand why you were in the hospital? YES, I HAD ANOTHER FLARE UP WITH MY BREATHING.  COPD EXACERBATION.   Do you understand the discharge instructions? YES, OXYGEN @ 2L NASAL CANNULA 24/7, TO USE THE NEBULIZER/INHALER AS DIRECTED, HOME HEALTH CARE IS IN PROGRESS.   Where were you discharged to? Home.   Items Reviewed:  Medications reviewed: YES, STARTED DOXYCYCLINE 100mg . STOPPED TAKING DELSYM 30mg /21ml LIQUID.  CONTINUING ALL OTHER MEDICATIONS WHICH HAVE NOT CHANGED, AS DIRECTED.  Allergies reviewed: YES, CODEINE, PENICILLINS, ZITHROMAX, PREDNISONE.  Dietary changes reviewed: YES, HEART HEALTHY DIET. NO CHANGES.  Referrals reviewed: YES, [place] WITH PCP.   Functional Questionnaire:   Activities of Daily Living (ADLs):   She states they are independent in the following: Toileting, self feeding.  Currently independent with bathing, dressing, meal prep, but becomes SOB and has to rest between activities.   States they require assistance with the following: Ambulating (walker in use).  Awaiting start date for assistance with [facility] with bathing, dressing, meal prep.   Any transportation issues/concerns?: NO.   Any patient concerns? NO.   Confirmed importance and date/time of follow-up visits scheduled YES, appointment scheduled 09/17/15 at 11:00.  Provider Appointment booked with Dr. Derrel Nip (PCP).  Confirmed with patient if condition begins to worsen call PCP or go to the ER.  Patient was given the office number and encouraged to call back with question or concerns.  : YES, VERBALIZED UNDERSTANDING.

## 2015-09-12 DIAGNOSIS — I10 Essential (primary) hypertension: Secondary | ICD-10-CM | POA: Diagnosis not present

## 2015-09-12 DIAGNOSIS — J441 Chronic obstructive pulmonary disease with (acute) exacerbation: Secondary | ICD-10-CM | POA: Diagnosis not present

## 2015-09-13 ENCOUNTER — Other Ambulatory Visit: Payer: Self-pay | Admitting: Internal Medicine

## 2015-09-13 DIAGNOSIS — Z1231 Encounter for screening mammogram for malignant neoplasm of breast: Secondary | ICD-10-CM

## 2015-09-14 DIAGNOSIS — I10 Essential (primary) hypertension: Secondary | ICD-10-CM | POA: Diagnosis not present

## 2015-09-14 DIAGNOSIS — J441 Chronic obstructive pulmonary disease with (acute) exacerbation: Secondary | ICD-10-CM | POA: Diagnosis not present

## 2015-09-17 ENCOUNTER — Encounter: Payer: Self-pay | Admitting: Internal Medicine

## 2015-09-17 ENCOUNTER — Ambulatory Visit (INDEPENDENT_AMBULATORY_CARE_PROVIDER_SITE_OTHER): Payer: Medicare Other | Admitting: Internal Medicine

## 2015-09-17 VITALS — BP 104/58 | HR 81 | Temp 97.7°F | Resp 12 | Ht 62.0 in | Wt 198.0 lb

## 2015-09-17 DIAGNOSIS — J9611 Chronic respiratory failure with hypoxia: Secondary | ICD-10-CM

## 2015-09-17 DIAGNOSIS — J441 Chronic obstructive pulmonary disease with (acute) exacerbation: Secondary | ICD-10-CM

## 2015-09-17 DIAGNOSIS — Z5189 Encounter for other specified aftercare: Secondary | ICD-10-CM

## 2015-09-17 DIAGNOSIS — Z09 Encounter for follow-up examination after completed treatment for conditions other than malignant neoplasm: Secondary | ICD-10-CM

## 2015-09-17 DIAGNOSIS — J9601 Acute respiratory failure with hypoxia: Secondary | ICD-10-CM | POA: Diagnosis not present

## 2015-09-17 MED ORDER — IPRATROPIUM BROMIDE 0.02 % IN SOLN
0.5000 mg | Freq: Once | RESPIRATORY_TRACT | Status: AC
  Administered 2015-09-17: 0.5 mg via RESPIRATORY_TRACT

## 2015-09-17 MED ORDER — ALBUTEROL SULFATE (2.5 MG/3ML) 0.083% IN NEBU
2.5000 mg | INHALATION_SOLUTION | Freq: Once | RESPIRATORY_TRACT | Status: AC
  Administered 2015-09-17: 2.5 mg via RESPIRATORY_TRACT

## 2015-09-17 MED ORDER — METHYLPREDNISOLONE ACETATE 80 MG/ML IJ SUSP
80.0000 mg | Freq: Once | INTRAMUSCULAR | Status: AC
  Administered 2015-09-17: 80 mg via INTRAMUSCULAR

## 2015-09-17 NOTE — Patient Instructions (Addendum)
I gave you a Depo Medrol injection in  your arm because you are wheezing again  Please continue to use the DuoNeb every 3 to 4 hours if needed and we will get you an appointment with Dr Raul Del this week   If you become more short of breath ,  You will need to go back to the ER

## 2015-09-17 NOTE — Progress Notes (Signed)
Subjective:  Patient ID: Lauren Bartlett, female    DOB: [birth year]  Age: 60 y.o. MRN: [id number]  CC: The primary encounter diagnosis was COPD exacerbation ([place]). Diagnoses of Chronic respiratory failure with hypoxia ([place]), Acute respiratory failure with hypoxia ([place]), and Hospital discharge follow-up were also pertinent to this visit.  HPI Lauren Bartlett presents for hospital follow up. Patient was admitted to [facility] on [facility] 6 with COPD exacerbation that started 2 days PTA.  She was treated with doxycycline and inhaled steroids  and discharged home on June 10. She Completed the antibiotic course of doxycyline 2 days ago.  She is wearing her supplemental oxygen and using all prescribed inhalers as prescribed along with Duonebs.  She was not given an appointment with her pulmonologist,  But has a general follow up in August with  Dr  Raul Del.   She does not feel well today dur to recurrence of symptoms.  Reports that she started feeling bad yesterday .  Chest started feeling tight and congested yesterday.  Feels short of breath with activity.   Sputum is clear to white .  NO fevers .  Using the DuoNeb  every 4 hours ,  Sometimes can't feel that it helps,    Does not tolerate oral prednisone but has tolerated Depo Medrol in the past.    Outpatient Prescriptions Prior to Visit  Medication Sig Dispense Refill  . albuterol (PROVENTIL HFA;VENTOLIN HFA) 108 (90 Base) MCG/ACT inhaler Inhale 2 puffs into the lungs every 6 (six) hours as needed for wheezing or shortness of breath. Pt uses with SPACER.    . fluticasone (FLONASE) 50 MCG/ACT nasal spray Place 2 sprays into both nostrils daily.    . fluticasone (FLOVENT HFA) 110 MCG/ACT inhaler Inhale 1 puff into the lungs 2 (two) times daily.    . hydrochlorothiazide (HYDRODIURIL) 25 MG tablet Take 25 mg by mouth daily.    [name] ipratropium-albuterol (DUONEB) 0.5-2.5 (3) MG/3ML SOLN Take 3 mLs by nebulization every 4 (four) hours as needed (for wheezing/shortness of  breath).    [name] levothyroxine (SYNTHROID, LEVOTHROID) 112 MCG tablet Take 112 mcg by mouth daily before breakfast.    . loratadine-pseudoephedrine (CLARITIN-D 24-HOUR) 10-240 MG 24 hr tablet Take 1 tablet by mouth daily.    [name] losartan (COZAAR) 100 MG tablet Take 1 tablet (100 mg total) by mouth daily. 30 tablet 3  . montelukast (SINGULAIR) 10 MG tablet Take 1 tablet (10 mg total) by mouth at bedtime. 30 tablet 3  . polyethylene glycol powder (GLYCOLAX/MIRALAX) powder Take 17 g by mouth 2 (two) times daily as needed. 3350 g 1  . Umeclidinium-Vilanterol (ANORO ELLIPTA) 62.5-25 MCG/INH AEPB Inhale 1 puff into the lungs daily.    [name] doxycycline (VIBRA-TABS) 100 MG tablet Take 1 tablet (100 mg total) by mouth 2 (two) times daily. (Patient not taking: Reported on 09/17/2015) 14 tablet 0  . Vitamin D, Ergocalciferol, (DRISDOL) 50000 units CAPS capsule Take 50,000 Units by mouth every 7 (seven) days. Pt takes on Saturday.     No facility-administered medications prior to visit.    Review of Systems;  Patient denies headache, fevers, malaise, unintentional weight loss, skin rash, eye pain, sinus congestion and sinus pain, sore throat, dysphagia,  hemoptysis , cough, dyspnea, wheezing, chest pain, palpitations, orthopnea, edema, abdominal pain, nausea, melena, diarrhea, constipation, flank pain, dysuria, hematuria, urinary  Frequency, nocturia, numbness, tingling, seizures,  Focal weakness, Loss of consciousness,  Tremor, insomnia, depression, anxiety, and suicidal ideation.  Objective:  BP 104/58 mmHg  Pulse 81  Temp(Src) 97.7 F (36.5 C) (Oral)  Resp 12  Ht 5\' 2"  (1.575 m)  Wt 198 lb (89.812 kg)  BMI 36.21 kg/m2  SpO2 97%  BP Readings from Last 3 Encounters:  09/17/15 104/58  09/08/15 119/65  08/25/15 116/65    Wt Readings from Last 3 Encounters:  09/17/15 198 lb (89.812 kg)  09/04/15 195 lb (88.451 kg)  08/25/15 195 lb (88.451 kg)    General appearance: alert, cooperative and  appears stated age Ears: normal TM's and external ear canals both ears Throat: lips, mucosa, and tongue normal; teeth and gums normal Neck: no adenopathy, no carotid bruit, supple, symmetrical, trachea midline and thyroid not enlarged, symmetric, no tenderness/mass/nodules Back: symmetric, no curvature. ROM normal. No CVA tenderness. Lungs: clear to auscultation bilaterally Heart: regular rate and rhythm, S1, S2 normal, no murmur, click, rub or gallop Abdomen: soft, non-tender; bowel sounds normal; no masses,  no organomegaly Pulses: 2+ and symmetric Skin: Skin color, texture, turgor normal. No rashes or lesions Lymph nodes: Cervical, supraclavicular, and axillary nodes normal.  Lab Results  Component Value Date   HGBA1C 5.4 07/28/2015    Lab Results  Component Value Date   CREATININE 0.69 09/07/2015   CREATININE 0.72 09/04/2015   CREATININE 0.78 08/25/2015    Lab Results  Component Value Date   WBC 7.8 09/04/2015   HGB 12.5 09/04/2015   HCT 38.0 09/04/2015   PLT 155 09/04/2015   GLUCOSE 90 09/04/2015   CHOL 173 03/14/2015   TRIG 85.0 03/14/2015   HDL 57.10 03/14/2015   LDLCALC 99 03/14/2015   ALT 12* 09/04/2015   AST 17 09/04/2015   NA 141 09/04/2015   K 4.4 09/04/2015   CL 104 09/04/2015   CREATININE 0.69 09/07/2015   BUN 18 09/04/2015   CO2 30 09/04/2015   TSH 1.02 03/14/2015   INR 1.1 02/14/2013   HGBA1C 5.4 07/28/2015    Dg Chest Portable 1 View  09/04/2015  CLINICAL DATA:  Short of breath, cough with phlegm EXAM: PORTABLE CHEST 1 VIEW COMPARISON:  08/25/2015 FINDINGS: Normal mediastinum and cardiac silhouette. Normal pulmonary vasculature. No evidence of effusion, infiltrate, or pneumothorax. No acute bony abnormality. IMPRESSION: No acute cardiopulmonary process. Electronically Signed   By: Suzy Bouchard M.D.   On: 09/04/2015 16:56    Assessment & Plan:   Problem List Items Addressed This Visit    COPD exacerbation ([place]) - Primary    IM steroid (80 mg  Depo Medrol) given today in office.  The cause of her frequent exacerbations is unclear.  Since she lives in a group home , question whether second hand smoke vs other environmental triggers are playing a role.       Relevant Medications   dextromethorphan (DELSYM) 30 MG/5ML liquid   albuterol (PROVENTIL) (2.5 MG/3ML) 0.083% nebulizer solution 2.5 mg (Completed)   ipratropium (ATROVENT) nebulizer solution 0.5 mg (Completed)   methylPREDNISolone acetate (DEPO-MEDROL) injection 80 mg (Completed)   Other Relevant Orders   Ambulatory referral to [facility]   Ambulatory referral to Pulmonology   Acute respiratory failure with hypoxia (HCC)    Recurrent ,  Multifactorial, With severe COPD, pulmonary hypertension, and severe mitral regurgitation  She has had 3  hospitalizations thus far this year and 5 additional ER visits this year alone.  She was discharged on June 10 th and began having re current symptoms on Jun 18.  She is wheezing today,  IM steroids given  and ecommended and return to ER if dyspnea does not improved.  Recommended that she see her pulmonologist  And cardiologist sooner than August .  Recommending second opinions since her pulmonary hypertension and mitral regurgitation may require intervention .  [facility] referral placed       Chronic respiratory failure with hypoxia (HCC)    Multifactorial,  With sleep apnea, pulmonary hypertension and COPD (non tobacco user) all contributing,  She has resting and ambulatory hypoxia and uses supplemental oxygen .  Pulmonologist is Dr. Raul Del.       Hospital discharge follow-up      I have reviewed the records from the hospital admission in detail with patient today.  She will  Continue 24 hour use of supplemental 02, and need to follow up with Dr Raul Del,  Dr Clayborn Bigness  And return here in one month           I have discontinued Ms. Bertz's Vitamin D (Ergocalciferol). I am also having her maintain her umeclidinium-vilanterol,  loratadine-pseudoephedrine, losartan, montelukast, hydrochlorothiazide, levothyroxine, polyethylene glycol powder, albuterol, fluticasone, ipratropium-albuterol, fluticasone, doxycycline, and dextromethorphan. We administered albuterol, ipratropium, and methylPREDNISolone acetate.  Meds ordered this encounter  Medications  . dextromethorphan (DELSYM) 30 MG/5ML liquid    Sig: Take 30 mg by mouth 2 (two) times daily.  [name] albuterol (PROVENTIL) (2.5 MG/3ML) 0.083% nebulizer solution 2.5 mg    Sig:   . ipratropium (ATROVENT) nebulizer solution 0.5 mg    Sig:   . methylPREDNISolone acetate (DEPO-MEDROL) injection 80 mg    Sig:     Medications Discontinued During This Encounter  Medication Reason  . Vitamin D, Ergocalciferol, (DRISDOL) 50000 units CAPS capsule Completed Course    Follow-up: No Follow-up on file.   [name], MD

## 2015-09-17 NOTE — Progress Notes (Signed)
Pre-visit discussion using our clinic review tool. No additional management support is needed unless otherwise documented below in the visit note.  

## 2015-09-18 DIAGNOSIS — I10 Essential (primary) hypertension: Secondary | ICD-10-CM | POA: Diagnosis not present

## 2015-09-18 DIAGNOSIS — J441 Chronic obstructive pulmonary disease with (acute) exacerbation: Secondary | ICD-10-CM | POA: Diagnosis not present

## 2015-09-18 NOTE — Assessment & Plan Note (Addendum)
Recurrent ,  Multifactorial, With severe COPD, pulmonary hypertension, and severe mitral regurgitation  She has had 3  hospitalizations thus far this year and 5 additional ER visits this year alone.  She was discharged on June 10 th and began having re current symptoms on Jun 18.  She is wheezing today,  IM steroids given and ecommended and return to ER if dyspnea does not improved.  Recommended that she see her pulmonologist  And cardiologist sooner than August .  Recommending second opinions since her pulmonary hypertension and mitral regurgitation may require intervention .  [facility] referral placed

## 2015-09-18 NOTE — Assessment & Plan Note (Signed)
I have reviewed the records from the hospital admission in detail with patient today.  She will  Continue 24 hour use of supplemental 02, and need to follow up with Dr Raul Del,  Dr Clayborn Bigness  And return here in one month

## 2015-09-18 NOTE — Assessment & Plan Note (Signed)
IM steroid (80 mg Depo Medrol) given today in office.  The cause of her frequent exacerbations is unclear.  Since she lives in a group home , question whether second hand smoke vs other environmental triggers are playing a role.

## 2015-09-18 NOTE — Assessment & Plan Note (Signed)
Multifactorial,  With sleep apnea, pulmonary hypertension and COPD (non tobacco user) all contributing,  She has resting and ambulatory hypoxia and uses supplemental oxygen .  Pulmonologist is Dr. Raul Del.

## 2015-09-19 DIAGNOSIS — I272 Other secondary pulmonary hypertension: Secondary | ICD-10-CM | POA: Diagnosis not present

## 2015-09-19 DIAGNOSIS — J441 Chronic obstructive pulmonary disease with (acute) exacerbation: Secondary | ICD-10-CM | POA: Diagnosis not present

## 2015-09-19 DIAGNOSIS — I341 Nonrheumatic mitral (valve) prolapse: Secondary | ICD-10-CM | POA: Diagnosis not present

## 2015-09-20 DIAGNOSIS — J441 Chronic obstructive pulmonary disease with (acute) exacerbation: Secondary | ICD-10-CM | POA: Diagnosis not present

## 2015-09-20 DIAGNOSIS — I10 Essential (primary) hypertension: Secondary | ICD-10-CM | POA: Diagnosis not present

## 2015-09-24 ENCOUNTER — Telehealth: Payer: Self-pay

## 2015-09-24 DIAGNOSIS — H2513 Age-related nuclear cataract, bilateral: Secondary | ICD-10-CM | POA: Diagnosis not present

## 2015-09-24 DIAGNOSIS — H25042 Posterior subcapsular polar age-related cataract, left eye: Secondary | ICD-10-CM | POA: Diagnosis not present

## 2015-09-24 NOTE — Telephone Encounter (Signed)
Verbal authorization given to continue PT

## 2015-09-24 NOTE — Telephone Encounter (Signed)
Lauren Bartlett with Lauren Bartlett called requesting verbal orders for continued physical therapy.  He reports she has increased risk of falling and mild weakness with a goal to walk a greater distance.  She was assessed on September 20, 2015 which started the 1x weekly-week 1. He would like to see her for a total of 5 visits with strengthening, fall prevention, and day training exercise program.  Call back number is [phone].

## 2015-09-25 DIAGNOSIS — I10 Essential (primary) hypertension: Secondary | ICD-10-CM | POA: Diagnosis not present

## 2015-09-25 DIAGNOSIS — J441 Chronic obstructive pulmonary disease with (acute) exacerbation: Secondary | ICD-10-CM | POA: Diagnosis not present

## 2015-09-26 NOTE — Addendum Note (Signed)
Addended by: Nanci Pina on: 09/26/2015 09:35 AM   Modules accepted: Miquel Dunn

## 2015-09-27 ENCOUNTER — Other Ambulatory Visit: Payer: Self-pay | Admitting: Internal Medicine

## 2015-09-27 DIAGNOSIS — I10 Essential (primary) hypertension: Secondary | ICD-10-CM | POA: Diagnosis not present

## 2015-09-27 DIAGNOSIS — J441 Chronic obstructive pulmonary disease with (acute) exacerbation: Secondary | ICD-10-CM | POA: Diagnosis not present

## 2015-10-02 DIAGNOSIS — I10 Essential (primary) hypertension: Secondary | ICD-10-CM | POA: Diagnosis not present

## 2015-10-02 DIAGNOSIS — J441 Chronic obstructive pulmonary disease with (acute) exacerbation: Secondary | ICD-10-CM | POA: Diagnosis not present

## 2015-10-03 DIAGNOSIS — I10 Essential (primary) hypertension: Secondary | ICD-10-CM | POA: Diagnosis not present

## 2015-10-03 DIAGNOSIS — J441 Chronic obstructive pulmonary disease with (acute) exacerbation: Secondary | ICD-10-CM | POA: Diagnosis not present

## 2015-10-04 DIAGNOSIS — J441 Chronic obstructive pulmonary disease with (acute) exacerbation: Secondary | ICD-10-CM | POA: Diagnosis not present

## 2015-10-04 DIAGNOSIS — I10 Essential (primary) hypertension: Secondary | ICD-10-CM | POA: Diagnosis not present

## 2015-10-09 DIAGNOSIS — I10 Essential (primary) hypertension: Secondary | ICD-10-CM | POA: Diagnosis not present

## 2015-10-09 DIAGNOSIS — J441 Chronic obstructive pulmonary disease with (acute) exacerbation: Secondary | ICD-10-CM | POA: Diagnosis not present

## 2015-10-11 ENCOUNTER — Telehealth: Payer: Self-pay | Admitting: Internal Medicine

## 2015-10-11 DIAGNOSIS — I10 Essential (primary) hypertension: Secondary | ICD-10-CM | POA: Diagnosis not present

## 2015-10-11 DIAGNOSIS — I272 Other secondary pulmonary hypertension: Secondary | ICD-10-CM | POA: Diagnosis not present

## 2015-10-11 DIAGNOSIS — J441 Chronic obstructive pulmonary disease with (acute) exacerbation: Secondary | ICD-10-CM | POA: Diagnosis not present

## 2015-10-11 DIAGNOSIS — R05 Cough: Secondary | ICD-10-CM | POA: Diagnosis not present

## 2015-10-11 NOTE — Telephone Encounter (Signed)
FYI

## 2015-10-11 NOTE — Telephone Encounter (Signed)
Patient Name: Lauren Bartlett  DOB: 10-13-1955    Initial Comment Caller states is on bp meds. Bp is low. Has been going to 100/58 at night (sometimes lower) aand making her dizzy   Nurse Assessment  Nurse: Raphael Gibney, RN, Vanita Ingles Date/Time (Eastern Time): 10/11/2015 1:23:41 PM  Confirm and document reason for call. If symptomatic, describe symptoms. You must click the next button to save text entered. ---Caller states she takes losartan 100 mg. BP 130/61 now. BP drops at night. BP was 100/58. Has some dizziness at night. Room spins at night.  Has the patient traveled out of the country within the last 30 days? ---Not Applicable  Does the patient have any new or worsening symptoms? ---Yes  Will a triage be completed? ---Yes  Related visit to physician within the last 2 weeks? ---No  Does the PT have any chronic conditions? (i.e. diabetes, asthma, etc.) ---Yes  List chronic conditions. ---HTN  Is this a behavioral health or substance abuse call? ---No     Guidelines    Guideline Title Affirmed Question Affirmed Notes  Low Blood Pressure [id number] Systolic BP [id number] AND [id number] taking blood pressure medications AND [3] NOT dizzy, lightheaded or weak    Final Disposition User   See Physician within 24 Hours Raphael Gibney, RN, Vera    Comments  appt scheduled for 10/12/2015 at 10:30 am with Dr. Tommi Rumps   Referrals  REFERRED TO PCP OFFICE   Disagree/Comply: Comply

## 2015-10-11 NOTE — Telephone Encounter (Signed)
Patient seeing you tomorrow

## 2015-10-12 ENCOUNTER — Ambulatory Visit (INDEPENDENT_AMBULATORY_CARE_PROVIDER_SITE_OTHER): Payer: Medicare Other | Admitting: Family Medicine

## 2015-10-12 ENCOUNTER — Telehealth: Payer: Self-pay | Admitting: *Deleted

## 2015-10-12 ENCOUNTER — Encounter: Payer: Self-pay | Admitting: Family Medicine

## 2015-10-12 VITALS — BP 122/80 | HR 75 | Temp 98.4°F | Ht 62.0 in | Wt 197.2 lb

## 2015-10-12 DIAGNOSIS — J441 Chronic obstructive pulmonary disease with (acute) exacerbation: Secondary | ICD-10-CM | POA: Diagnosis not present

## 2015-10-12 DIAGNOSIS — R002 Palpitations: Secondary | ICD-10-CM

## 2015-10-12 DIAGNOSIS — R42 Dizziness and giddiness: Secondary | ICD-10-CM | POA: Diagnosis not present

## 2015-10-12 LAB — BASIC METABOLIC PANEL
BUN: 16 mg/dL (ref 6–23)
CHLORIDE: 103 meq/L (ref 96–112)
CO2: 30 mEq/L (ref 19–32)
Calcium: 8.8 mg/dL (ref 8.4–10.5)
Creatinine, Ser: 0.78 mg/dL (ref 0.40–1.20)
GFR: 80.11 mL/min (ref >60.00)
GLUCOSE: 65 mg/dL — AB (ref 70–99)
POTASSIUM: 4.4 meq/L (ref 3.5–5.1)
SODIUM: 141 meq/L (ref 135–145)

## 2015-10-12 LAB — CBC
HCT: 38.8 % (ref 36.0–46.0)
Hemoglobin: 12.5 g/dL (ref 12.0–15.0)
MCHC: 32.3 g/dL (ref 30.0–36.0)
MCV: 90.6 fl (ref 78.0–100.0)
Platelets: 166 10*3/uL (ref 150.0–400.0)
RBC: 4.28 Mil/uL (ref 3.87–5.11)
RDW: 15.4 % (ref 11.5–15.5)
WBC: 6.3 10*3/uL (ref 4.0–10.5)

## 2015-10-12 MED ORDER — HYDROCHLOROTHIAZIDE 25 MG PO TABS: 12.5000 mg | ORAL_TABLET | Freq: Every day | ORAL | Status: DC

## 2015-10-12 MED ORDER — METHYLPREDNISOLONE ACETATE 40 MG/ML IJ SUSP
40.0000 mg | Freq: Once | INTRAMUSCULAR | Status: AC
  Administered 2015-10-12: 40 mg via INTRAMUSCULAR

## 2015-10-12 NOTE — Addendum Note (Signed)
Addended by: Vernetta Honey on: 10/12/2015 11:45 AM   Modules accepted: Orders

## 2015-10-12 NOTE — Assessment & Plan Note (Addendum)
Patient with lightheadedness associated with low normal blood pressures only at night. It occurs after standing up out of bed. Resolves within several minutes. Possibly has fast heart rate with this. EKG done today was reassuring. Possibly has some room spinning sensation with this as well. Neurologically intact today. Exam of right tympanic membrane does reveal white material at the inferior border that appears to be solid in nature though could be from the area where she had prior tympanic membrane surgery. She does have a heart murmur though on comparison to most recent cardiology note this appears stable at 2/6 systolic murmur. Suspect orthostasis versus overtreated hypertension though orthostatics negative in the office. We will decrease her hydrochlorothiazide to 12.5 mg daily and continue losartan. She will monitor blood pressure at home. If not improving she'll let us know. If greater than 140/90 persistently she will let us know as well. We will check a CBC and CMP to rule out other causes of lightheadedness and palpitations. She will follow-up with her cardiologist as scheduled later this month. She will set up follow-up with her ear nose and throat physician as well to evaluate her right tympanic membrane. She'll continue to monitor. She's given return precautions.

## 2015-10-12 NOTE — Assessment & Plan Note (Signed)
Patient with likely COPD exacerbation. Seen by her pulmonologist yesterday and started on doxycycline. She states she cannot take prednisone. She has tolerated IM Depo-Medrol previously. She was given IM Depo-Medrol 40 mg in the office today. She'll finish the course of doxycycline. She is given return precautions.

## 2015-10-12 NOTE — Progress Notes (Signed)
Patient ID: Lauren Bartlett, female   DOB: 05-04-1955, 60 y.o.   MRN: [id number]  Lauren Rumps, MD Phone: [phone]  Blakley Cassata is a 60 y.o. female who presents today for same-day visit.  Patient notes for the last 3-4 days she's been getting lightheaded at night. She'll get up from sleep and feel lightheaded and maybe has a room spinning sensation as well. She has checked her blood pressure and has been in the 90s over 50s with this. Has been more normal during the day. Occasionally notes a fast heartbeat with this that resolves within several minutes. No chest pain or shortness of breath with this. No numbness or weakness. Symptoms always resolve within several minutes.  Patient additionally notes chest congestion and wheezing. Saw her pulmonologist yesterday and he placed her on doxycycline and prednisone. She states she can't take prednisone as it makes her hallucinate and that she can tolerate injectable steroids. No shortness of breath. No chest pain. Light-colored clear mucus. No increase in her oxygen usage. Using 2 L of oxygen. Albuterol is being used 3-4 times a day. Previously has responded well to IM Depo-Medrol.  PMH: nonsmoker.   ROS see history of present illness  Objective  Physical Exam Filed Vitals:   10/12/15 1021  BP: 122/80  Pulse: 75  Temp: 98.4 F (36.9 C)    BP Readings from Last 3 Encounters:  10/12/15 122/80  09/17/15 104/58  09/08/15 119/65   Wt Readings from Last 3 Encounters:  10/12/15 197 lb 4 oz (89.472 kg)  09/17/15 198 lb (89.812 kg)  09/04/15 195 lb (88.451 kg)   Laying blood pressure 128/82  sitting blood pressure 122/80  standing blood pressure 122/78  Physical Exam  Constitutional: No distress.  HENT:  Head: Normocephalic and atraumatic.  Right Ear: External ear normal.  Left Ear: External ear normal.  Ears:  Mouth/Throat: Oropharynx is clear and moist. No oropharyngeal exudate.  Cardiovascular: Normal rate and regular rhythm.     Murmur (2/6 systolic murmur) heard. Pulmonary/Chest: Effort normal. No respiratory distress. She has wheezes (scattered expiratory wheezes). She has no rales.  Neurological: She is alert.  CN 2-12 intact, 5/5 strength in bilateral biceps, triceps, grip, quads, hamstrings, plantar and dorsiflexion, sensation to light touch intact in bilateral UE and LE, deliberate gait, 2+ patellar reflexes, negative Romberg  Skin: Skin is warm and dry. She is not diaphoretic.   EKG: Normal sinus rhythm, rate 79, RSR prime in V1, no ST or T-wave changes, when compared to prior EKG no changes noted  Assessment/Plan: Please see individual problem list.  COPD exacerbation ([place]) Patient with likely COPD exacerbation. Seen by her pulmonologist yesterday and started on doxycycline. She states she cannot take prednisone. She has tolerated IM Depo-Medrol previously. She was given IM Depo-Medrol 40 mg in the office today. She'll finish the course of doxycycline. She is given return precautions.  Lightheadedness Patient with lightheadedness associated with low normal blood pressures only at night. It occurs after standing up out of bed. Resolves within several minutes. Possibly has fast heart rate with this. EKG done today was reassuring. Possibly has some room spinning sensation with this as well. Neurologically intact today. Exam of right tympanic membrane does reveal white material at the inferior border that appears to be solid in nature though could be from the area where she had prior tympanic membrane surgery. She does have a heart murmur though on comparison to most recent cardiology note this appears stable at 2/6 systolic murmur. Suspect orthostasis  versus overtreated hypertension though orthostatics negative in the office. We will decrease her hydrochlorothiazide to 12.5 mg daily and continue losartan. She will monitor blood pressure at home. If not improving she'll let us know. If greater than 140/90 persistently  she will let us know as well. We will check a CBC and CMP to rule out other causes of lightheadedness and palpitations. She will follow-up with her cardiologist as scheduled later this month. She will set up follow-up with her ear nose and throat physician as well to evaluate her right tympanic membrane. She'll continue to monitor. She's given return precautions.    Orders Placed This Encounter  Procedures  . CBC  . Basic Metabolic Panel (BMET)  . EKG 12-Lead    Lauren Rumps, MD [place]

## 2015-10-12 NOTE — Patient Instructions (Signed)
Nice to meet you. Please keep your follow-up with her cardiologist. Please call your ear nose and throat physician as well to set up follow-up to look at her right ear. We are going to decrease your HCTZ to 12.5 mg (half a tablet) to see if this will help with her lightheadedness and blood pressure. We'll check some lab work to make sure that you are not anemic and we'll check your electrolytes. We have given you a steroid injection to help with her wheezing. Please finish the course of antibiotics with the pulmonologist prescribed. If you develop chest pain, shortness of breath, cough productive of blood, fevers, persistent lightheadedness or dizziness, numbness, weakness, or any new or change in symptoms please seek medical attention.

## 2015-10-12 NOTE — Telephone Encounter (Signed)
Patient requested to know the name of the steroid injection she received along with the dosage.  Pt contact [phone]

## 2015-10-12 NOTE — Telephone Encounter (Signed)
Spoke with patient, verbalized the injection she received. thanks

## 2015-10-12 NOTE — Progress Notes (Signed)
Pre visit review using our clinic review tool, if applicable. No additional management support is needed unless otherwise documented below in the visit note. 

## 2015-10-14 DIAGNOSIS — I10 Essential (primary) hypertension: Secondary | ICD-10-CM | POA: Diagnosis not present

## 2015-10-14 DIAGNOSIS — J441 Chronic obstructive pulmonary disease with (acute) exacerbation: Secondary | ICD-10-CM | POA: Diagnosis not present

## 2015-10-15 DIAGNOSIS — H25043 Posterior subcapsular polar age-related cataract, bilateral: Secondary | ICD-10-CM | POA: Diagnosis not present

## 2015-10-17 DIAGNOSIS — I10 Essential (primary) hypertension: Secondary | ICD-10-CM | POA: Diagnosis not present

## 2015-10-17 DIAGNOSIS — J441 Chronic obstructive pulmonary disease with (acute) exacerbation: Secondary | ICD-10-CM | POA: Diagnosis not present

## 2015-10-18 ENCOUNTER — Telehealth: Payer: Self-pay

## 2015-10-18 DIAGNOSIS — R7309 Other abnormal glucose: Secondary | ICD-10-CM

## 2015-10-18 DIAGNOSIS — E162 Hypoglycemia, unspecified: Secondary | ICD-10-CM

## 2015-10-18 NOTE — Telephone Encounter (Signed)
Pt coming for repeat labs tomorrow. Need future order. Looks like Dr. Caryl Bis requested have a bmet repeated? Is this correct?

## 2015-10-18 NOTE — Telephone Encounter (Signed)
Order placed

## 2015-10-19 ENCOUNTER — Other Ambulatory Visit (INDEPENDENT_AMBULATORY_CARE_PROVIDER_SITE_OTHER): Payer: Medicare Other

## 2015-10-19 DIAGNOSIS — E162 Hypoglycemia, unspecified: Secondary | ICD-10-CM

## 2015-10-19 DIAGNOSIS — R799 Abnormal finding of blood chemistry, unspecified: Secondary | ICD-10-CM

## 2015-10-19 DIAGNOSIS — R7309 Other abnormal glucose: Secondary | ICD-10-CM

## 2015-10-19 LAB — BASIC METABOLIC PANEL
BUN: 18 mg/dL (ref 6–23)
CHLORIDE: 101 meq/L (ref 96–112)
CO2: 32 mEq/L (ref 19–32)
Calcium: 8.8 mg/dL (ref 8.4–10.5)
Creatinine, Ser: 0.83 mg/dL (ref 0.40–1.20)
GFR: 74.56 mL/min (ref >60.00)
GLUCOSE: 67 mg/dL — AB (ref 70–99)
POTASSIUM: 4.1 meq/L (ref 3.5–5.1)
SODIUM: 140 meq/L (ref 135–145)

## 2015-10-20 ENCOUNTER — Other Ambulatory Visit: Payer: Self-pay | Admitting: Family Medicine

## 2015-10-22 ENCOUNTER — Telehealth: Payer: Self-pay | Admitting: *Deleted

## 2015-10-22 DIAGNOSIS — I10 Essential (primary) hypertension: Secondary | ICD-10-CM | POA: Diagnosis not present

## 2015-10-22 DIAGNOSIS — J441 Chronic obstructive pulmonary disease with (acute) exacerbation: Secondary | ICD-10-CM | POA: Diagnosis not present

## 2015-10-22 NOTE — Telephone Encounter (Signed)
Can you assist with this ? 

## 2015-10-22 NOTE — Telephone Encounter (Signed)
Quinn Axe from [place] has requested a update on medicaid PCS referral,pt is currently receiving home health.

## 2015-10-25 ENCOUNTER — Ambulatory Visit
Admission: RE | Admit: 2015-10-25 | Discharge: 2015-10-25 | Disposition: A | Discharge status: Home / Self Care | Payer: Medicare Other | Source: Ambulatory Visit | Attending: Internal Medicine | Admitting: Internal Medicine

## 2015-10-25 DIAGNOSIS — Z1231 Encounter for screening mammogram for malignant neoplasm of breast: Secondary | ICD-10-CM | POA: Diagnosis not present

## 2015-10-29 DIAGNOSIS — H25043 Posterior subcapsular polar age-related cataract, bilateral: Secondary | ICD-10-CM | POA: Diagnosis not present

## 2015-10-30 DIAGNOSIS — I10 Essential (primary) hypertension: Secondary | ICD-10-CM | POA: Diagnosis not present

## 2015-10-30 DIAGNOSIS — J441 Chronic obstructive pulmonary disease with (acute) exacerbation: Secondary | ICD-10-CM | POA: Diagnosis not present

## 2015-11-05 ENCOUNTER — Telehealth: Payer: Self-pay | Admitting: Internal Medicine

## 2015-11-05 NOTE — Telephone Encounter (Signed)
This is for PCS. There should be paperwork that has been filled out by the provider/nurse that needs to be faxed back to them. I do not fill out these forms.

## 2015-11-05 NOTE — Telephone Encounter (Signed)
Please see 2nd request, this was also sent to you last week by Lavella Lemons

## 2015-11-05 NOTE — Telephone Encounter (Signed)
Lauren Bartlett called from [facility] regarding pt PCS referral needing a follow up status? Thank you!

## 2015-11-06 NOTE — Telephone Encounter (Signed)
Form is in [facility] [place] folder for review, thanks

## 2015-11-07 ENCOUNTER — Encounter: Payer: Self-pay | Admitting: *Deleted

## 2015-11-09 ENCOUNTER — Ambulatory Visit: Payer: Medicare Other

## 2015-11-09 ENCOUNTER — Encounter: Payer: Self-pay | Admitting: Emergency Medicine

## 2015-11-09 ENCOUNTER — Ambulatory Visit
Admission: EM | Admit: 2015-11-09 | Discharge: 2015-11-09 | Disposition: A | Discharge status: Home / Self Care | Payer: Medicare Other | Attending: Emergency Medicine | Admitting: Emergency Medicine

## 2015-11-09 DIAGNOSIS — S63502A Unspecified sprain of left wrist, initial encounter: Secondary | ICD-10-CM | POA: Diagnosis not present

## 2015-11-09 DIAGNOSIS — M25532 Pain in left wrist: Secondary | ICD-10-CM

## 2015-11-09 DIAGNOSIS — Z9981 Dependence on supplemental oxygen: Secondary | ICD-10-CM | POA: Insufficient documentation

## 2015-11-09 DIAGNOSIS — F329 Major depressive disorder, single episode, unspecified: Secondary | ICD-10-CM | POA: Insufficient documentation

## 2015-11-09 DIAGNOSIS — Z79899 Other long term (current) drug therapy: Secondary | ICD-10-CM | POA: Insufficient documentation

## 2015-11-09 DIAGNOSIS — Z880 Allergy status to penicillin: Secondary | ICD-10-CM | POA: Insufficient documentation

## 2015-11-09 DIAGNOSIS — I10 Essential (primary) hypertension: Secondary | ICD-10-CM | POA: Diagnosis not present

## 2015-11-09 DIAGNOSIS — E039 Hypothyroidism, unspecified: Secondary | ICD-10-CM | POA: Diagnosis not present

## 2015-11-09 DIAGNOSIS — J449 Chronic obstructive pulmonary disease, unspecified: Secondary | ICD-10-CM | POA: Insufficient documentation

## 2015-11-09 MED ORDER — TRAMADOL HCL 50 MG PO TABS: ORAL_TABLET | ORAL | 0 refills | Status: DC

## 2015-11-09 NOTE — Discharge Instructions (Signed)
1 g of Tylenol up to 4 times a day as needed for pain. You may take the tramadol for severe pain. Ice for 20 minutes at a time, keep it elevated above her heart as much as possible. Wear the splint as needed for comfort, you may take it off to shower. Take it off once a day to move the wrist around help prevent the joint from becoming stiff. Follow-up with Dr. Grandville Silos if not getting better in a week to 10 days.

## 2015-11-09 NOTE — ED Provider Notes (Signed)
HPI  SUBJECTIVE:  Lauren Bartlett is a right handed 60 y.o. female who presents with stabbing, constant left ulnar wrist pain starting yesterday. Patient states that she has been more active recently, cleaning closets and using her wrists more than usual. She denies any alleviating factors. She has not tried anything for this. She states the symptoms are worse with straightening her fingers, pronation, supination and extension of her wrist. There does not seem to be pain with radial ulnar deviation. She denies fevers, trauma, fall, erythema, swelling, numbness, tingling. She reports grip weakness secondary to pain, but denies any true grip weakness. She has never injured this wrist, has never had symptoms like this before. Patient has an extensive past medical history including single kidney, COPD/emphysema and 2 L of supplemental oxygen, hypertension, hypothyroidism, pulmonary hypertension. No history of diabetes, kidney disease gout, osteoporosis. States that she prefers to take Tylenol and not NSAIDs.  Past Medical History:  Diagnosis Date  . Anxiety   . Congenital absence of one kidney   . COPD (chronic obstructive pulmonary disease) ([place])   . Depression    treated at [facility]  . Emphysema/COPD ([place])   . History of sciatica   . Hypertension   . hypothyrodism   . Hypothyroidism   . Motion sickness    cars  . MVP (mitral valve prolapse)   . PONV (postoperative nausea and vomiting)   . Pulmonary hypertension ([place])   . Sleep apnea    O2 at night and PRN    Past Surgical History:  Procedure Laterality Date  . ABDOMINAL HYSTERECTOMY    . BREAST BIOPSY Right 2011   [facility]< benign  . COMBINED HYSTERECTOMY ABDOMINAL W/ A&P REPAIR / OOPHORECTOMY  1996   benign tumor  . INNER EAR SURGERY     bilateral    Family History  Problem Relation Age of Onset  . Multiple sclerosis Mother   . Coronary artery disease Father   . Heart disease Brother   . Breast cancer Paternal Aunt     Social  History  Substance Use Topics  . Smoking status: Never Smoker  . Smokeless tobacco: Never Used  . Alcohol use No    No current facility-administered medications for this encounter.   Current Outpatient Prescriptions:  .  albuterol (PROVENTIL HFA;VENTOLIN HFA) 108 (90 Base) MCG/ACT inhaler, Inhale 2 puffs into the lungs every 6 (six) hours as needed for wheezing or shortness of breath. Pt uses with SPACER., Disp: , Rfl:  .  fluticasone (FLONASE) 50 MCG/ACT nasal spray, Place 2 sprays into both nostrils daily., Disp: , Rfl:  .  fluticasone (FLOVENT HFA) 110 MCG/ACT inhaler, Inhale 1 puff into the lungs 2 (two) times daily., Disp: , Rfl:  .  hydrochlorothiazide (HYDRODIURIL) 25 MG tablet, TAKE 1/2 TABLET(12.5 MG) BY MOUTH DAILY, Disp: 45 tablet, Rfl: 1 .  ipratropium-albuterol (DUONEB) 0.5-2.5 (3) MG/3ML SOLN, Take 3 mLs by nebulization every 4 (four) hours as needed (for wheezing/shortness of breath)., Disp: , Rfl:  .  levothyroxine (SYNTHROID, LEVOTHROID) 112 MCG tablet, Take 112 mcg by mouth daily before breakfast., Disp: , Rfl:  .  loratadine-pseudoephedrine (CLARITIN-D 24-HOUR) 10-240 MG 24 hr tablet, Take 1 tablet by mouth daily., Disp: , Rfl:  .  losartan (COZAAR) 100 MG tablet, TAKE 1 TABLET(100 MG) BY MOUTH DAILY, Disp: 30 tablet, Rfl: 5 .  montelukast (SINGULAIR) 10 MG tablet, Take 1 tablet (10 mg total) by mouth at bedtime., Disp: 30 tablet, Rfl: 3 .  OXYGEN, Inhale 2  L into the lungs at bedtime as needed (at bedtime and as needed)., Disp: , Rfl:  .  polyethylene glycol powder (GLYCOLAX/MIRALAX) powder, Take 17 g by mouth 2 (two) times daily as needed., Disp: 3350 g, Rfl: 1 .  traMADol (ULTRAM) 50 MG tablet, 1-2 tabs po q 6 hr prn pain Maximum dose= 8 tablets per day, Disp: 20 tablet, Rfl: 0 .  Umeclidinium-Vilanterol (ANORO ELLIPTA) 62.5-25 MCG/INH AEPB, Inhale 1 puff into the lungs daily., Disp: , Rfl:   Allergies  Allergen Reactions  . Codeine Hives and Nausea And Vomiting  .  Penicillins Hives, Itching and Other (See Comments)    Has patient had a PCN reaction causing immediate rash, facial/tongue/throat swelling, SOB or lightheadedness with hypotension: No Has patient had a PCN reaction causing severe rash involving mucus membranes or skin necrosis: No Has patient had a PCN reaction that required hospitalization No Has patient had a PCN reaction occurring within the last 10 years: No If all of the above answers are "NO", then may proceed with Cephalosporin use.  [name] Zithromax [Azithromycin] Other (See Comments)    Reaction:  Hallucinations   . Prednisone Palpitations and Other (See Comments)    Reaction:  Hallucinations      ROS  As noted in HPI.   Physical Exam  BP 117/71 (BP Location: Right Arm)   Pulse 78   Temp 97.8 F (36.6 C) (Oral)   Resp 18   SpO2 100%   Constitutional: Well developed, well nourished, no acute distress Eyes:  EOMI, conjunctiva normal bilaterally HENT: Normocephalic, atraumatic,mucus membranes moist Respiratory: Normal inspiratory effort Cardiovascular: Normal rate GI: nondistended skin: No rash, skin intact Musculoskeletal:  L  distal radius NT, distal ulna and ulnar styloid tender, snuffbox NT, carpals NT, metacarpals NT, digits NT , TFCC NT.  Pain with supination,  pain n with pronation, no pain with radial / ulnar deviation. Pain with wrist extension, no pain with wrist flexion. Motor intact ability to flex / extend digits of affected hand, Sensation LT to hand normal, CR<2 seconds distally. [place] neg.  Shoulder and upper arm NT, Elbow and proximal forearm NT on affected extremity. Neurologic: Alert & oriented x 3, no focal neuro deficits Psychiatric: Speech and behavior appropriate   ED Course   Medications - No data to display  Orders Placed This Encounter  Procedures  . DG Wrist Complete Left    Standing Status:   Standing    Number of Occurrences:   1    Order Specific Question:   Reason for Exam  (SYMPTOM  OR DIAGNOSIS REQUIRED)    Answer:   Left wrist pain  . Splint wrist    Standing Status:   Standing    Number of Occurrences:   1    Order Specific Question:   Laterality    Answer:   Left    No results found for this or any previous visit (from the past 24 hour(s)). Dg Wrist Complete Left  Result Date: 11/09/2015 CLINICAL DATA:  Acute left wrist pain without known injury. EXAM: LEFT WRIST - COMPLETE 3+ VIEW COMPARISON:  None. FINDINGS: There is no evidence of fracture or dislocation. There is no evidence of arthropathy or other focal bone abnormality. Soft tissues are unremarkable. IMPRESSION: Normal left wrist. Electronically Signed   By: Marijo Conception, M.D.   On: 11/09/2015 09:30    ED Clinical Impression  Left wrist pain  Left wrist sprain, initial encounter  ED Assessment/Plan  Reviewed  imaging independently. No fracture, dislocation. See radiology report for full details. reviewed  recent labs. most recent kidney function on 7/27 normal.   Presentation most suggestive of the wrist overuse/strain. We'll place in a splint, rest, ice, elevate, Tylenol 4 times a day as needed, tramadol for severe pain. Patient states that she does not have any history of anaphylaxis with narcotics, only nausea and vomiting and is willing to try to tramadol. Patient to follow-up with Dr. Grandville Silos, hand surgeon on call, if no better in a week to 10 days of conservative treatment. Discussed  imaging, MDM, plan and followup with patient. Patient agrees with plan.   *This clinic note was created using Dragon dictation software. Therefore, there may be occasional mistakes despite careful proofreading.  ?   Melynda Ripple, MD 11/09/15 [phone]

## 2015-11-09 NOTE — ED Triage Notes (Signed)
Patient c/o pain in her left wrist that started yesterday.  Patient denies injury.

## 2015-11-12 NOTE — Discharge Instructions (Signed)

## 2015-11-14 ENCOUNTER — Encounter
Admission: RE | Disposition: A | Discharge status: Home / Self Care | Payer: Self-pay | Source: Ambulatory Visit | Attending: Ophthalmology

## 2015-11-14 ENCOUNTER — Ambulatory Visit: Payer: Medicare Other | Admitting: Anesthesiology

## 2015-11-14 ENCOUNTER — Ambulatory Visit
Admission: RE | Admit: 2015-11-14 | Discharge: 2015-11-14 | Disposition: A | Discharge status: Home / Self Care | Payer: Medicare Other | Source: Ambulatory Visit | Attending: Ophthalmology | Admitting: Ophthalmology

## 2015-11-14 DIAGNOSIS — Z885 Allergy status to narcotic agent status: Secondary | ICD-10-CM | POA: Insufficient documentation

## 2015-11-14 DIAGNOSIS — F419 Anxiety disorder, unspecified: Secondary | ICD-10-CM | POA: Diagnosis not present

## 2015-11-14 DIAGNOSIS — Z9981 Dependence on supplemental oxygen: Secondary | ICD-10-CM | POA: Diagnosis not present

## 2015-11-14 DIAGNOSIS — M7989 Other specified soft tissue disorders: Secondary | ICD-10-CM | POA: Diagnosis not present

## 2015-11-14 DIAGNOSIS — Z880 Allergy status to penicillin: Secondary | ICD-10-CM | POA: Diagnosis not present

## 2015-11-14 DIAGNOSIS — J42 Unspecified chronic bronchitis: Secondary | ICD-10-CM | POA: Insufficient documentation

## 2015-11-14 DIAGNOSIS — Q600 Renal agenesis, unilateral: Secondary | ICD-10-CM | POA: Diagnosis not present

## 2015-11-14 DIAGNOSIS — H2512 Age-related nuclear cataract, left eye: Secondary | ICD-10-CM | POA: Insufficient documentation

## 2015-11-14 DIAGNOSIS — E039 Hypothyroidism, unspecified: Secondary | ICD-10-CM | POA: Insufficient documentation

## 2015-11-14 DIAGNOSIS — R011 Cardiac murmur, unspecified: Secondary | ICD-10-CM | POA: Diagnosis not present

## 2015-11-14 DIAGNOSIS — Z888 Allergy status to other drugs, medicaments and biological substances status: Secondary | ICD-10-CM | POA: Insufficient documentation

## 2015-11-14 DIAGNOSIS — Z90710 Acquired absence of both cervix and uterus: Secondary | ICD-10-CM | POA: Insufficient documentation

## 2015-11-14 DIAGNOSIS — H25043 Posterior subcapsular polar age-related cataract, bilateral: Secondary | ICD-10-CM | POA: Diagnosis not present

## 2015-11-14 DIAGNOSIS — R0601 Orthopnea: Secondary | ICD-10-CM | POA: Insufficient documentation

## 2015-11-14 DIAGNOSIS — J449 Chronic obstructive pulmonary disease, unspecified: Secondary | ICD-10-CM | POA: Diagnosis not present

## 2015-11-14 DIAGNOSIS — I10 Essential (primary) hypertension: Secondary | ICD-10-CM | POA: Diagnosis not present

## 2015-11-14 HISTORY — DX: Other specified postprocedural states: Z98.890

## 2015-11-14 HISTORY — DX: Hypothyroidism, unspecified: E03.9

## 2015-11-14 HISTORY — DX: Motion sickness, initial encounter: T75.3XXA

## 2015-11-14 HISTORY — PX: CATARACT EXTRACTION W/PHACO: SHX586

## 2015-11-14 HISTORY — DX: Pulmonary hypertension, unspecified: I27.20

## 2015-11-14 HISTORY — DX: Nausea with vomiting, unspecified: R11.2

## 2015-11-14 HISTORY — DX: Sleep apnea, unspecified: G47.30

## 2015-11-14 SURGERY — PHACOEMULSIFICATION, CATARACT, WITH IOL INSERTION
Anesthesia: Monitor Anesthesia Care | Laterality: Left | Wound class: Clean

## 2015-11-14 MED ORDER — ACETAMINOPHEN 160 MG/5ML PO SOLN: 325.0000 mg | ORAL | Status: DC | PRN

## 2015-11-14 MED ORDER — BALANCED SALT IO SOLN
INTRAOCULAR | Status: DC | PRN
  Administered 2015-11-14: 1 mL via OPHTHALMIC

## 2015-11-14 MED ORDER — ARMC OPHTHALMIC DILATING GEL
1.0000 "application " | OPHTHALMIC | Status: DC | PRN
  Administered 2015-11-14 (×2): 1 via OPHTHALMIC

## 2015-11-14 MED ORDER — MIDAZOLAM HCL 2 MG/2ML IJ SOLN
INTRAMUSCULAR | Status: DC | PRN
  Administered 2015-11-14 (×2): 1 mg via INTRAVENOUS

## 2015-11-14 MED ORDER — ACETAMINOPHEN 325 MG PO TABS: 325.0000 mg | ORAL_TABLET | ORAL | Status: DC | PRN

## 2015-11-14 MED ORDER — NA HYALUR & NA CHOND-NA HYALUR 0.4-0.35 ML IO KIT
PACK | INTRAOCULAR | Status: DC | PRN
  Administered 2015-11-14: 1 mL via INTRAOCULAR

## 2015-11-14 MED ORDER — BRIMONIDINE TARTRATE 0.2 % OP SOLN
OPHTHALMIC | Status: DC | PRN
  Administered 2015-11-14: 1 [drp] via OPHTHALMIC

## 2015-11-14 MED ORDER — FENTANYL CITRATE (PF) 100 MCG/2ML IJ SOLN
INTRAMUSCULAR | Status: DC | PRN
  Administered 2015-11-14: 50 ug via INTRAVENOUS

## 2015-11-14 MED ORDER — TETRACAINE HCL 0.5 % OP SOLN
1.0000 [drp] | OPHTHALMIC | Status: DC | PRN
  Administered 2015-11-14: 1 [drp] via OPHTHALMIC

## 2015-11-14 MED ORDER — EPINEPHRINE HCL 1 MG/ML IJ SOLN
INTRAOCULAR | Status: DC | PRN
  Administered 2015-11-14: 42 mL via OPHTHALMIC

## 2015-11-14 MED ORDER — CEFUROXIME OPHTHALMIC INJECTION 1 MG/0.1 ML
INJECTION | OPHTHALMIC | Status: DC | PRN
  Administered 2015-11-14: 0.1 mL via OPHTHALMIC

## 2015-11-14 MED ORDER — TIMOLOL MALEATE 0.5 % OP SOLN
OPHTHALMIC | Status: DC | PRN
  Administered 2015-11-14: 1 [drp] via OPHTHALMIC

## 2015-11-14 MED ORDER — POVIDONE-IODINE 5 % OP SOLN
1.0000 "application " | OPHTHALMIC | Status: DC | PRN
  Administered 2015-11-14: 1 via OPHTHALMIC

## 2015-11-14 SURGICAL SUPPLY — 27 items
CANNULA ANT/CHMB 27GA (MISCELLANEOUS) ×3 IMPLANT
CARTRIDGE ABBOTT (MISCELLANEOUS) IMPLANT
GLOVE SURG LX 7.5 STRW (GLOVE) ×2
GLOVE SURG LX STRL 7.5 STRW (GLOVE) ×1 IMPLANT
GLOVE SURG TRIUMPH 8.0 PF LTX (GLOVE) ×3 IMPLANT
GOWN STRL REUS W/ TWL LRG LVL3 (GOWN DISPOSABLE) ×2 IMPLANT
GOWN STRL REUS W/TWL LRG LVL3 (GOWN DISPOSABLE) ×4
LENS IOL ACRSF IQ TRC 9 23.0 (Intraocular Lens) ×1 IMPLANT
LENS IOL ACRYSOF IQ TORIC 23.0 (Intraocular Lens) ×2 IMPLANT
LENS IOL IQ TORIC 9 23.0 (Intraocular Lens) ×1 IMPLANT
MARKER SKIN DUAL TIP RULER LAB (MISCELLANEOUS) ×3 IMPLANT
NDL RETROBULBAR .5 NSTRL (NEEDLE) IMPLANT
NEEDLE FILTER BLUNT 18X 1/2SAF (NEEDLE) ×2
NEEDLE FILTER BLUNT 18X1 1/2 (NEEDLE) ×1 IMPLANT
PACK CATARACT BRASINGTON (MISCELLANEOUS) ×3 IMPLANT
PACK EYE AFTER SURG (MISCELLANEOUS) ×3 IMPLANT
PACK OPTHALMIC (MISCELLANEOUS) ×3 IMPLANT
RING MALYGIN 7.0 (MISCELLANEOUS) IMPLANT
SUT ETHILON 10-0 CS-B-6CS-B-6 (SUTURE)
SUT VICRYL  9 0 (SUTURE)
SUT VICRYL 9 0 (SUTURE) IMPLANT
SUTURE EHLN 10-0 CS-B-6CS-B-6 (SUTURE) IMPLANT
SYR 3ML LL SCALE MARK (SYRINGE) ×3 IMPLANT
SYR 5ML LL (SYRINGE) ×3 IMPLANT
SYR TB 1ML LUER SLIP (SYRINGE) ×3 IMPLANT
WATER STERILE IRR 250ML POUR (IV SOLUTION) ×3 IMPLANT
WIPE NON LINTING 3.25X3.25 (MISCELLANEOUS) ×3 IMPLANT

## 2015-11-14 NOTE — Transfer of Care (Signed)
Immediate Anesthesia Transfer of Care Note  Patient: Lauren Bartlett  Procedure(s) Performed: Procedure(s) with comments: CATARACT EXTRACTION PHACO AND INTRAOCULAR LENS PLACEMENT (IOC) (Left) - sleep apnea Toric  Patient Location: PACU  Anesthesia Type: MAC  Level of Consciousness: awake, alert  and patient cooperative  Airway and Oxygen Therapy: Patient Spontanous Breathing and Patient connected to supplemental oxygen  Post-op Assessment: Post-op Vital signs reviewed, Patient's Cardiovascular Status Stable, Respiratory Function Stable, Patent Airway and No signs of Nausea or vomiting  Post-op Vital Signs: Reviewed and stable  Complications: No apparent anesthesia complications

## 2015-11-14 NOTE — Anesthesia Postprocedure Evaluation (Signed)
Anesthesia Post Note  Patient: Lauren Bartlett  Procedure(s) Performed: Procedure(s) (LRB): CATARACT EXTRACTION PHACO AND INTRAOCULAR LENS PLACEMENT (IOC) (Left)  Patient location during evaluation: PACU Anesthesia Type: MAC Level of consciousness: awake and alert Pain management: pain level controlled Vital Signs Assessment: post-procedure vital signs reviewed and stable Respiratory status: spontaneous breathing, nonlabored ventilation, respiratory function stable and patient connected to nasal cannula oxygen Cardiovascular status: stable and blood pressure returned to baseline Anesthetic complications: no    Amaryllis Dyke

## 2015-11-14 NOTE — Anesthesia Procedure Notes (Signed)
Procedure Name: MAC Date/Time: 11/14/2015 9:57 AM Performed by: Janna Arch Pre-anesthesia Checklist: Patient identified, Emergency Drugs available, Suction available, Timeout performed and Patient being monitored Patient Re-evaluated:Patient Re-evaluated prior to inductionOxygen Delivery Method: Nasal cannula

## 2015-11-14 NOTE — H&P (Signed)
  The History and Physical notes are on paper, have been signed, and are to be scanned. The patient remains stable and unchanged from the H&P.   Previous H&P reviewed, patient examined, and there are no changes.  Lauren Bartlett 11/14/2015 9:46 AM

## 2015-11-14 NOTE — Op Note (Signed)
LOCATION:  [place]   PREOPERATIVE DIAGNOSIS:  Nuclear sclerotic cataract of the left eye.  H25.12  POSTOPERATIVE DIAGNOSIS:  Nuclear sclerotic cataract of the left eye.   PROCEDURE:  Phacoemulsification with Toric posterior chamber intraocular lens placement of the left eye.   LENS:  Implant Name Type Inv. Item Serial No. Manufacturer Lot No. LRB No. Used  23.0D toric lens     [id number] ALCON   Left 1   [id number] Toric intraocular lens with 6.0 diopters of cylindrical power with axis orientation at 174 degrees.   ULTRASOUND TIME: 16 % of 0 minutes, 32 seconds.  CDE 5.1   SURGEON:  Wyonia Hough, MD   ANESTHESIA:  Topical with tetracaine drops and 2% Xylocaine jelly, augmented with 1% preservative-free intracameral lidocaine.  COMPLICATIONS:  None.   DESCRIPTION OF PROCEDURE:  The patient was identified in the holding room and transported to the operating suite and placed in the supine position under the operating microscope.  The left eye was identified as the operative eye, and it was prepped and draped in the usual sterile ophthalmic fashion.    A clear-corneal paracentesis incision was made at the 1:30 position.  0.5 ml of preservative-free 1% lidocaine was injected into the anterior chamber. The anterior chamber was filled with Viscoat.  A 2.4 millimeter near clear corneal incision was then made at the 10:30 position.  A cystotome and capsulorrhexis forceps were then used to make a curvilinear capsulorrhexis.  Hydrodissection and hydrodelineation were then performed using balanced salt solution.   Phacoemulsification was then used in stop and chop fashion to remove the lens, nucleus and epinucleus.  The remaining cortex was aspirated using the irrigation and aspiration handpiece.  Provisc viscoelastic was then placed into the capsular bag to distend it for lens placement.  The Verion digital marker was used to align the implant at the intended axis.   A 23.0  diopter lens was then injected into the capsular bag.  It was rotated clockwise until the axis marks on the lens were approximately 15 degrees in the counterclockwise direction to the intended alignment.  The viscoelastic was aspirated from the eye using the irrigation aspiration handpiece.  Then, a Koch spatula through the sideport incision was used to rotate the lens in a clockwise direction until the axis markings of the intraocular lens were lined up with the Verion alignment.  Balanced salt solution was then used to hydrate the wounds. Cefuroxime 0.1 ml of a 10mg /ml solution was injected into the anterior chamber for a dose of 1 mg of intracameral antibiotic at the completion of the case.    The eye was noted to have a physiologic pressure and there was no wound leak noted.   Timolol and Brimonidine drops were applied to the eye.  The patient was taken to the recovery room in stable condition having had no complications of anesthesia or surgery.  Lauren Bartlett 11/14/2015, 10:17 AM

## 2015-11-14 NOTE — Anesthesia Preprocedure Evaluation (Signed)
Anesthesia Evaluation  Patient identified by MRN, date of birth, ID band Patient awake    Reviewed: Allergy & Precautions, H&P , NPO status   History of Anesthesia Complications (+) PONV and history of anesthetic complications  Airway Mallampati: II  TM Distance: >3 FB Neck ROM: full    Dental   Pulmonary sleep apnea , COPD,    + rhonchi        Cardiovascular hypertension, Normal cardiovascular exam     Neuro/Psych PSYCHIATRIC DISORDERS    GI/Hepatic   Endo/Other  Hypothyroidism   Renal/GU      Musculoskeletal   Abdominal   Peds  Hematology   Anesthesia Other Findings   Reproductive/Obstetrics                             Anesthesia Physical Anesthesia Plan  ASA: III  Anesthesia Plan: MAC   Post-op Pain Management:    Induction:   Airway Management Planned:   Additional Equipment:   Intra-op Plan:   Post-operative Plan:   Informed Consent: I have reviewed the patients History and Physical, chart, labs and discussed the procedure including the risks, benefits and alternatives for the proposed anesthesia with the patient or authorized representative who has indicated his/her understanding and acceptance.     Plan Discussed with: CRNA  Anesthesia Plan Comments:         Anesthesia Quick Evaluation

## 2015-12-03 ENCOUNTER — Ambulatory Visit
Admission: EM | Admit: 2015-12-03 | Discharge: 2015-12-03 | Disposition: A | Discharge status: Home / Self Care | Payer: Medicare Other | Attending: Family Medicine | Admitting: Family Medicine

## 2015-12-03 ENCOUNTER — Encounter: Payer: Self-pay | Admitting: *Deleted

## 2015-12-03 DIAGNOSIS — J441 Chronic obstructive pulmonary disease with (acute) exacerbation: Secondary | ICD-10-CM

## 2015-12-03 DIAGNOSIS — J9801 Acute bronchospasm: Secondary | ICD-10-CM | POA: Diagnosis not present

## 2015-12-03 MED ORDER — IPRATROPIUM-ALBUTEROL 0.5-2.5 (3) MG/3ML IN SOLN
3.0000 mL | Freq: Once | RESPIRATORY_TRACT | Status: AC
  Administered 2015-12-03: 3 mL via RESPIRATORY_TRACT

## 2015-12-03 MED ORDER — METHYLPREDNISOLONE SODIUM SUCC 125 MG IJ SOLR
125.0000 mg | Freq: Once | INTRAMUSCULAR | Status: AC
  Administered 2015-12-03: 125 mg via INTRAMUSCULAR

## 2015-12-03 NOTE — ED Provider Notes (Signed)
[facility]    CSN: [id number] Arrival date & time: 12/03/15  [id number]  First Provider Contact:  First MD Initiated Contact with Patient 12/03/15 [phone]        History   Chief Complaint Chief Complaint  Patient presents with  . Shortness of Breath  . Wheezing    HPI Lauren Bartlett is a 60 y.o. female.   Patient is here because of shortness of breath. She has history of COPD emphysema. She states the COPD emphysema came from secondhand smoke. I think started this morning when she woke up. She states that she is ready on an antibiotic doxycycline but that usually when this flares up she can take an IM dose of Solu-Medrol but she cannot take oral steroids because it makes her have CNS side effects she is allergic to multiple medications. She has a history of hypertension and mitral valve prolapse pulmonary hypertension hypothyroidism and for eczema and COPD. She also has a congenital absence of one kidney.  Family history nothing significant pertinent to today's visit. Multiple drug allergies including codeine, oral prednisone, penicillins and Zithromax. She states normally the Solu-Medrol breathing treatment makes a difference in her life.   The history is provided by the patient and a friend. No language interpreter was used.  Shortness of Breath  Severity:  Severe Onset quality:  Sudden Timing:  Constant Progression:  Worsening Chronicity:  New Context: URI   Context: not activity, not animal exposure, not emotional upset, not fumes, not known allergens, not occupational exposure, not smoke exposure and not strong odors   Relieved by:  Nothing Worsened by:  Exertion Ineffective treatments:  Inhaler Associated symptoms: cough, diaphoresis, sputum production and wheezing   Associated symptoms: no abdominal pain, no chest pain, no claudication, no fever, no headaches, no neck pain, no PND and no rash   Risk factors: no recent alcohol use, no family hx of DVT, no hx of cancer,  no hx of PE/DVT, no obesity, no oral contraceptive use, no prolonged immobilization, no recent surgery and no tobacco use   Wheezing  Associated symptoms: cough, shortness of breath and sputum production   Associated symptoms: no chest pain, no fever, no headaches, no PND and no rash     Past Medical History:  Diagnosis Date  . Anxiety   . Congenital absence of one kidney   . COPD (chronic obstructive pulmonary disease) ([place])   . Depression    treated at [facility]  . Emphysema/COPD ([place])   . History of sciatica   . Hypertension   . hypothyrodism   . Hypothyroidism   . Motion sickness    cars  . MVP (mitral valve prolapse)   . PONV (postoperative nausea and vomiting)   . Pulmonary hypertension ([place])   . Sleep apnea    O2 at night and PRN    Patient Active Problem List   Diagnosis Date Noted  . Lightheadedness 10/12/2015  . Constipation 08/05/2015  . Hospital discharge follow-up 08/05/2015  . Thrombocytopenia ([place]) 08/05/2015  . Acute respiratory failure with hypoxia ([place])   . Chronic respiratory failure with hypoxia ([place])   . Ileus ([place])   . Encounter for preventive health examination 03/14/2015  . Breast cancer screening 03/14/2015  . Need for hepatitis C screening test 03/14/2015  . COPD with hypoxia ([place]) 02/16/2015  . COPD exacerbation ([place]) 02/03/2015  . Vitamin D deficiency 01/11/2015  . S/P hysterectomy with oophorectomy 09/22/2013  . Pulmonary hypertension ([place]) 07/14/2013  . Hypothyroidism 05/04/2012  .  Severe mitral regurgitation by prior echocardiogram 01/11/2012  . Obesity (BMI 30-39.9) 08/04/2011  . Depression   . Hypertension   . Congenital absence of one kidney   . History of sciatica     Past Surgical History:  Procedure Laterality Date  . ABDOMINAL HYSTERECTOMY    . BREAST BIOPSY Right 2011   [facility]< benign  . CATARACT EXTRACTION W/PHACO Left 11/14/2015   Procedure: CATARACT EXTRACTION PHACO AND INTRAOCULAR LENS PLACEMENT (IOC);  Surgeon:  Leandrew Koyanagi, MD;  Location: [facility];  Service: Ophthalmology;  Laterality: Left;  sleep apnea Toric  . COMBINED HYSTERECTOMY ABDOMINAL W/ A&P REPAIR / OOPHORECTOMY  1996   benign tumor  . INNER EAR SURGERY     bilateral    OB History    No data available       Home Medications    Prior to Admission medications   Medication Sig Start Date End Date Taking? Authorizing Provider  albuterol (PROVENTIL HFA;VENTOLIN HFA) 108 (90 Base) MCG/ACT inhaler Inhale 2 puffs into the lungs every 6 (six) hours as needed for wheezing or shortness of breath. Pt uses with SPACER.   Yes Historical Provider, MD  fluticasone (FLONASE) 50 MCG/ACT nasal spray Place 2 sprays into both nostrils daily.   Yes Historical Provider, MD  fluticasone (FLOVENT HFA) 110 MCG/ACT inhaler Inhale 1 puff into the lungs 2 (two) times daily.   Yes Historical Provider, MD  hydrochlorothiazide (HYDRODIURIL) 25 MG tablet TAKE 1/2 TABLET(12.5 MG) BY MOUTH DAILY 10/22/15  Yes [name], MD  ipratropium-albuterol (DUONEB) 0.5-2.5 (3) MG/3ML SOLN Take 3 mLs by nebulization every 4 (four) hours as needed (for wheezing/shortness of breath).   Yes Historical Provider, MD  levothyroxine (SYNTHROID, LEVOTHROID) 112 MCG tablet Take 112 mcg by mouth daily before breakfast.   Yes Historical Provider, MD  loratadine-pseudoephedrine (CLARITIN-D 24-HOUR) 10-240 MG 24 hr tablet Take 1 tablet by mouth daily.   Yes Historical Provider, MD  losartan (COZAAR) 100 MG tablet TAKE 1 TABLET(100 MG) BY MOUTH DAILY 09/27/15  Yes [name], MD  montelukast (SINGULAIR) 10 MG tablet Take 1 tablet (10 mg total) by mouth at bedtime. 06/05/15  Yes Fritzi Mandes, MD  OXYGEN Inhale 2 L into the lungs at bedtime as needed (at bedtime and as needed).   Yes Historical Provider, MD  Umeclidinium-Vilanterol (ANORO ELLIPTA) 62.5-25 MCG/INH AEPB Inhale 1 puff into the lungs daily.   Yes Historical Provider, MD  polyethylene glycol powder  (GLYCOLAX/MIRALAX) powder Take 17 g by mouth 2 (two) times daily as needed. 08/03/15   [name], MD  traMADol (ULTRAM) 50 MG tablet 1-2 tabs po q 6 hr prn pain Maximum dose= 8 tablets per day Patient not taking: Reported on 11/14/2015 11/09/15   Melynda Ripple, MD    Family History Family History  Problem Relation Age of Onset  . Multiple sclerosis Mother   . Coronary artery disease Father   . Heart disease Brother   . Breast cancer Paternal Aunt     Social History Social History  Substance Use Topics  . Smoking status: Never Smoker  . Smokeless tobacco: Never Used  . Alcohol use No     Allergies   Codeine; Penicillins; Zithromax [azithromycin]; and Prednisone   Review of Systems Review of Systems  Constitutional: Positive for diaphoresis. Negative for fever.  Respiratory: Positive for cough, sputum production, shortness of breath and wheezing.   Cardiovascular: Negative for chest pain, claudication and PND.  Gastrointestinal: Negative for abdominal pain.  Musculoskeletal: Negative for neck pain.  Skin: Negative for rash.  Neurological: Negative for headaches.  All other systems reviewed and are negative.    Physical Exam Triage Vital Signs ED Triage Vitals  Enc Vitals Group     BP 12/03/15 0941 119/63     Pulse Rate 12/03/15 0941 71     Resp 12/03/15 0941 (!) 28     Temp 12/03/15 0941 97.9 F (36.6 C)     Temp Source 12/03/15 0941 Oral     SpO2 12/03/15 0941 100 %     Weight 12/03/15 0943 204 lb (92.5 kg)     Height 12/03/15 0943 5' (1.524 m)     Head Circumference --      Peak Flow --      Pain Score --      Pain Loc --      Pain Edu? --      Excl. in [place]? --    No data found.   Updated Vital Signs BP 119/63 (BP Location: Left Arm)   Pulse 71   Temp 97.9 F (36.6 C) (Oral)   Resp (!) 28   Ht 5' (1.524 m)   Wt 204 lb (92.5 kg)   SpO2 100%   BMI 39.84 kg/m   Visual Acuity Right Eye Distance:   Left Eye Distance:   Bilateral Distance:      Right Eye Near:   Left Eye Near:    Bilateral Near:     Physical Exam  Constitutional: She is oriented to person, place, and time. She appears well-developed and well-nourished.  HENT:  Head: Normocephalic and atraumatic.  Eyes: EOM are normal. Pupils are equal, round, and reactive to light.  Neck: Normal range of motion. Neck supple.  Cardiovascular: Normal rate.   Murmur heard.  Systolic murmur is present with a grade of 5/6  Pulmonary/Chest: Tachypnea noted. She has decreased breath sounds. She has wheezes.  Musculoskeletal: Normal range of motion.  Neurological: She is alert and oriented to person, place, and time.  Skin: Skin is warm. She is diaphoretic.  Psychiatric: She has a normal mood and affect. Her behavior is normal.  Vitals reviewed.    UC Treatments / Results  Labs (all labs ordered are listed, but only abnormal results are displayed) Labs Reviewed - No data to display  EKG  EKG Interpretation None       Radiology No results found.  Procedures Procedures (including critical care time)  Medications Ordered in UC Medications  methylPREDNISolone sodium succinate (SOLU-MEDROL) 125 mg/2 mL injection 125 mg (125 mg Intramuscular Given 12/03/15 1009)  ipratropium-albuterol (DUONEB) 0.5-2.5 (3) MG/3ML nebulizer solution 3 mL (3 mLs Nebulization Given 12/03/15 1011)     Initial Impression / Assessment and Plan / UC Course  I have reviewed the triage vital signs and the nursing notes.  Pertinent labs & imaging results that were available during my care of the patient were reviewed by me and considered in my medical decision making (see chart for details).  Clinical Course    Patient given DuoNeb treatment she reports improvement with the DuoNeb treatment. She is also recommend 25 Solu-Medrol recommend follow-up ED not better or symptoms persist.  Final Clinical Impressions(s) / UC Diagnoses   Final diagnoses:  COPD exacerbation (HCC)  Bronchospasm     New Prescriptions New Prescriptions   No medications on file  Follow-up with Dr.Tuelo on Tuesday Wednesday  if not better or go to the ED of choice if symptoms get  worse   Frederich Cha, MD 12/03/15 [phone]

## 2015-12-03 NOTE — ED Triage Notes (Signed)
Pt awoke this am wheezing and dyspneic. Has hx of COPD and uses oxygen at intermittently. Has had a recent sinusitis and is doxycycline. Audible wheezes.

## 2015-12-12 DIAGNOSIS — H2511 Age-related nuclear cataract, right eye: Secondary | ICD-10-CM | POA: Diagnosis not present

## 2015-12-14 DIAGNOSIS — J439 Emphysema, unspecified: Secondary | ICD-10-CM | POA: Diagnosis not present

## 2015-12-17 NOTE — Discharge Instructions (Signed)

## 2015-12-19 ENCOUNTER — Ambulatory Visit: Payer: Medicare Other | Admitting: Anesthesiology

## 2015-12-19 ENCOUNTER — Ambulatory Visit: Payer: Medicare Other | Admitting: Internal Medicine

## 2015-12-19 ENCOUNTER — Encounter
Admission: RE | Disposition: A | Discharge status: Home / Self Care | Payer: Self-pay | Source: Ambulatory Visit | Attending: Ophthalmology

## 2015-12-19 ENCOUNTER — Ambulatory Visit
Admission: RE | Admit: 2015-12-19 | Discharge: 2015-12-19 | Disposition: A | Discharge status: Home / Self Care | Payer: Medicare Other | Source: Ambulatory Visit | Attending: Ophthalmology | Admitting: Ophthalmology

## 2015-12-19 DIAGNOSIS — Z888 Allergy status to other drugs, medicaments and biological substances status: Secondary | ICD-10-CM | POA: Diagnosis not present

## 2015-12-19 DIAGNOSIS — G4730 Sleep apnea, unspecified: Secondary | ICD-10-CM | POA: Insufficient documentation

## 2015-12-19 DIAGNOSIS — I10 Essential (primary) hypertension: Secondary | ICD-10-CM | POA: Insufficient documentation

## 2015-12-19 DIAGNOSIS — Z881 Allergy status to other antibiotic agents status: Secondary | ICD-10-CM | POA: Diagnosis not present

## 2015-12-19 DIAGNOSIS — E039 Hypothyroidism, unspecified: Secondary | ICD-10-CM | POA: Diagnosis not present

## 2015-12-19 DIAGNOSIS — J449 Chronic obstructive pulmonary disease, unspecified: Secondary | ICD-10-CM | POA: Insufficient documentation

## 2015-12-19 DIAGNOSIS — Z880 Allergy status to penicillin: Secondary | ICD-10-CM | POA: Insufficient documentation

## 2015-12-19 DIAGNOSIS — Z885 Allergy status to narcotic agent status: Secondary | ICD-10-CM | POA: Diagnosis not present

## 2015-12-19 DIAGNOSIS — H2511 Age-related nuclear cataract, right eye: Secondary | ICD-10-CM | POA: Diagnosis not present

## 2015-12-19 HISTORY — PX: CATARACT EXTRACTION W/PHACO: SHX586

## 2015-12-19 SURGERY — PHACOEMULSIFICATION, CATARACT, WITH IOL INSERTION
Anesthesia: Monitor Anesthesia Care | Site: Eye | Laterality: Right | Wound class: Clean

## 2015-12-19 MED ORDER — MIDAZOLAM HCL 2 MG/2ML IJ SOLN
INTRAMUSCULAR | Status: DC | PRN
  Administered 2015-12-19: 2 mg via INTRAVENOUS

## 2015-12-19 MED ORDER — EPINEPHRINE HCL 1 MG/ML IJ SOLN
INTRAOCULAR | Status: DC | PRN
  Administered 2015-12-19: 39 mL via OPHTHALMIC

## 2015-12-19 MED ORDER — TETRACAINE HCL 0.5 % OP SOLN
1.0000 [drp] | OPHTHALMIC | Status: DC | PRN
  Administered 2015-12-19: 1 [drp] via OPHTHALMIC

## 2015-12-19 MED ORDER — LIDOCAINE HCL (PF) 4 % IJ SOLN
INTRAMUSCULAR | Status: DC | PRN
  Administered 2015-12-19: 1 mL via OPHTHALMIC

## 2015-12-19 MED ORDER — FENTANYL CITRATE (PF) 100 MCG/2ML IJ SOLN
INTRAMUSCULAR | Status: DC | PRN
  Administered 2015-12-19: 100 ug via INTRAVENOUS

## 2015-12-19 MED ORDER — POVIDONE-IODINE 5 % OP SOLN
1.0000 "application " | OPHTHALMIC | Status: DC | PRN
  Administered 2015-12-19: 1 via OPHTHALMIC

## 2015-12-19 MED ORDER — NA HYALUR & NA CHOND-NA HYALUR 0.4-0.35 ML IO KIT
PACK | INTRAOCULAR | Status: DC | PRN
  Administered 2015-12-19: 1 mL via INTRAOCULAR

## 2015-12-19 MED ORDER — MOXIFLOXACIN HCL 0.5 % OP SOLN
OPHTHALMIC | Status: DC | PRN
  Administered 2015-12-19: .2 mL via OPHTHALMIC

## 2015-12-19 MED ORDER — ARMC OPHTHALMIC DILATING GEL
1.0000 "application " | OPHTHALMIC | Status: DC | PRN
  Administered 2015-12-19 (×2): 1 via OPHTHALMIC

## 2015-12-19 MED ORDER — BRIMONIDINE TARTRATE 0.2 % OP SOLN
OPHTHALMIC | Status: DC | PRN
  Administered 2015-12-19: 1 [drp] via OPHTHALMIC

## 2015-12-19 MED ORDER — TIMOLOL MALEATE 0.5 % OP SOLN
OPHTHALMIC | Status: DC | PRN
  Administered 2015-12-19: 1 [drp] via OPHTHALMIC

## 2015-12-19 SURGICAL SUPPLY — 25 items
ACRYSOF IQ TORIC LENS ×2 IMPLANT
CANNULA ANT/CHMB 27GA (MISCELLANEOUS) ×2 IMPLANT
CARTRIDGE ABBOTT (MISCELLANEOUS) IMPLANT
GLOVE SURG LX 7.5 STRW (GLOVE) ×1
GLOVE SURG LX STRL 7.5 STRW (GLOVE) ×1 IMPLANT
GLOVE SURG TRIUMPH 8.0 PF LTX (GLOVE) ×2 IMPLANT
GOWN STRL REUS W/ TWL LRG LVL3 (GOWN DISPOSABLE) ×2 IMPLANT
GOWN STRL REUS W/TWL LRG LVL3 (GOWN DISPOSABLE) ×2
MARKER SKIN DUAL TIP RULER LAB (MISCELLANEOUS) ×2 IMPLANT
NDL RETROBULBAR .5 NSTRL (NEEDLE) IMPLANT
NEEDLE FILTER BLUNT 18X 1/2SAF (NEEDLE) ×2
NEEDLE FILTER BLUNT 18X1 1/2 (NEEDLE) ×2 IMPLANT
PACK CATARACT BRASINGTON (MISCELLANEOUS) ×2 IMPLANT
PACK EYE AFTER SURG (MISCELLANEOUS) ×2 IMPLANT
PACK OPTHALMIC (MISCELLANEOUS) ×2 IMPLANT
RING MALYGIN 7.0 (MISCELLANEOUS) IMPLANT
SUT ETHILON 10-0 CS-B-6CS-B-6 (SUTURE)
SUT VICRYL  9 0 (SUTURE)
SUT VICRYL 9 0 (SUTURE) IMPLANT
SUTURE EHLN 10-0 CS-B-6CS-B-6 (SUTURE) IMPLANT
SYR 3ML LL SCALE MARK (SYRINGE) ×4 IMPLANT
SYR 5ML LL (SYRINGE) ×2 IMPLANT
SYR TB 1ML LUER SLIP (SYRINGE) ×2 IMPLANT
WATER STERILE IRR 250ML POUR (IV SOLUTION) ×2 IMPLANT
WIPE NON LINTING 3.25X3.25 (MISCELLANEOUS) ×2 IMPLANT

## 2015-12-19 NOTE — Anesthesia Postprocedure Evaluation (Signed)
Anesthesia Post Note  Patient: Lauren Bartlett  Procedure(s) Performed: Procedure(s) (LRB): CATARACT EXTRACTION PHACO AND INTRAOCULAR LENS PLACEMENT (IOC) (Right)  Patient location during evaluation: PACU Anesthesia Type: MAC Level of consciousness: awake and alert Pain management: pain level controlled Vital Signs Assessment: post-procedure vital signs reviewed and stable Respiratory status: spontaneous breathing, nonlabored ventilation, respiratory function stable and patient connected to nasal cannula oxygen Cardiovascular status: stable and blood pressure returned to baseline Anesthetic complications: no    Amaryllis Dyke

## 2015-12-19 NOTE — H&P (Signed)
The History and Physical notes are on paper, have been signed, and are to be scanned. The patient remains stable and unchanged from the H&P.   Previous H&P reviewed, patient examined, and there are no changes.  Lauren Bartlett 12/19/2015 9:33 AM

## 2015-12-19 NOTE — Anesthesia Preprocedure Evaluation (Signed)
Anesthesia Evaluation  Patient identified by MRN, date of birth, ID band Patient awake    Reviewed: Allergy & Precautions, H&P , NPO status   History of Anesthesia Complications (+) PONV and history of anesthetic complications  Airway Mallampati: II  TM Distance: >3 FB Neck ROM: full    Dental   Pulmonary sleep apnea , COPD,    + rhonchi        Cardiovascular hypertension, Normal cardiovascular exam     Neuro/Psych PSYCHIATRIC DISORDERS    GI/Hepatic   Endo/Other  Hypothyroidism   Renal/GU      Musculoskeletal   Abdominal   Peds  Hematology   Anesthesia Other Findings   Reproductive/Obstetrics                             Anesthesia Physical  Anesthesia Plan  ASA: III  Anesthesia Plan: MAC   Post-op Pain Management:    Induction:   Airway Management Planned:   Additional Equipment:   Intra-op Plan:   Post-operative Plan:   Informed Consent: I have reviewed the patients History and Physical, chart, labs and discussed the procedure including the risks, benefits and alternatives for the proposed anesthesia with the patient or authorized representative who has indicated his/her understanding and acceptance.     Plan Discussed with: CRNA  Anesthesia Plan Comments:         Anesthesia Quick Evaluation

## 2015-12-19 NOTE — Anesthesia Procedure Notes (Signed)
Procedure Name: MAC Performed by: Sallee Hogrefe Pre-anesthesia Checklist: Patient identified, Emergency Drugs available, Suction available, Timeout performed and Patient being monitored Patient Re-evaluated:Patient Re-evaluated prior to inductionOxygen Delivery Method: Nasal cannula Placement Confirmation: positive ETCO2     

## 2015-12-19 NOTE — Op Note (Signed)
LOCATION:  [place]   PREOPERATIVE DIAGNOSIS:  Nuclear sclerotic cataract of the right eye.  H25.11   POSTOPERATIVE DIAGNOSIS:  Nuclear sclerotic cataract of the right eye.   PROCEDURE:  Phacoemulsification with Toric posterior chamber intraocular lens placement of the right eye.   LENS:   Implant Name Type Inv. Item Serial No. Manufacturer Lot No. LRB No. Used  ACRYSOF IQ TORIC LENS     [id number] ALCON   Right 1     [id number] 22.5 D Toric intraocular lens with 3.75 diopters of cylindrical power with axis orientation at 23 degrees.   ULTRASOUND TIME: 17 % of 0 minutes, 45 seconds.  CDE 7.8   SURGEON:  Wyonia Hough, MD   ANESTHESIA: Topical with tetracaine drops and 2% Xylocaine jelly, augmented with 1% preservative-free intracameral lidocaine. .   COMPLICATIONS:  None.   DESCRIPTION OF PROCEDURE:  The patient was identified in the holding room and transported to the operating suite and placed in the supine position under the operating microscope.  The right eye was identified as the operative eye, and it was prepped and draped in the usual sterile ophthalmic fashion.    A clear-corneal paracentesis incision was made at the 12:00 position.  0.5 ml of preservative-free 1% lidocaine was injected into the anterior chamber. The anterior chamber was filled with Viscoat.  A 2.4 millimeter near clear corneal incision was then made at the 9:00 position.  A cystotome and capsulorrhexis forceps were then used to make a curvilinear capsulorrhexis.  Hydrodissection and hydrodelineation were then performed using balanced salt solution.   Phacoemulsification was then used in stop and chop fashion to remove the lens, nucleus and epinucleus.  The remaining cortex was aspirated using the irrigation and aspiration handpiece.  Provisc viscoelastic was then placed into the capsular bag to distend it for lens placement.  The Verion digital marker was used to align the implant at the  intended axis.   A Toric lens was then injected into the capsular bag.  It was rotated clockwise until the axis marks on the lens were approximately 15 degrees in the counterclockwise direction to the intended alignment.  The viscoelastic was aspirated from the eye using the irrigation aspiration handpiece.  Then, a Koch spatula through the sideport incision was used to rotate the lens in a clockwise direction until the axis markings of the intraocular lens were lined up with the Verion alignment.  Balanced salt solution was then used to hydrate the wounds. Vigamox 0.2 ml of a 1mg  per ml solution was injected into the anterior chamber for a dose of 0.2 mg of intracameral antibiotic at the completion of the case.    The eye was noted to have a physiologic pressure and there was no wound leak noted.   Timolol and Brimonidine drops were applied to the eye.  The patient was taken to the recovery room in stable condition having had no complications of anesthesia or surgery.  Navin Dogan 12/19/2015, 11:06 AM

## 2015-12-19 NOTE — Transfer of Care (Signed)
Immediate Anesthesia Transfer of Care Note  Patient: Lauren Bartlett  Procedure(s) Performed: Procedure(s) with comments: CATARACT EXTRACTION PHACO AND INTRAOCULAR LENS PLACEMENT (IOC) (Right) - ANXIETY GENEROUS IV SEDATION TORIC LEN  Patient Location: PACU  Anesthesia Type: MAC  Level of Consciousness: awake, alert  and patient cooperative  Airway and Oxygen Therapy: Patient Spontanous Breathing and Patient connected to supplemental oxygen  Post-op Assessment: Post-op Vital signs reviewed, Patient's Cardiovascular Status Stable, Respiratory Function Stable, Patent Airway and No signs of Nausea or vomiting  Post-op Vital Signs: Reviewed and stable  Complications: No apparent anesthesia complications

## 2015-12-20 ENCOUNTER — Encounter: Payer: Self-pay | Admitting: Ophthalmology

## 2016-01-02 ENCOUNTER — Encounter: Payer: Self-pay | Admitting: Internal Medicine

## 2016-01-02 ENCOUNTER — Telehealth: Payer: Self-pay | Admitting: Internal Medicine

## 2016-01-02 ENCOUNTER — Ambulatory Visit (INDEPENDENT_AMBULATORY_CARE_PROVIDER_SITE_OTHER): Payer: Medicare Other | Admitting: Internal Medicine

## 2016-01-02 VITALS — BP 116/64 | HR 69 | Temp 97.9°F | Resp 10 | Ht 60.0 in | Wt 202.5 lb

## 2016-01-02 DIAGNOSIS — I340 Nonrheumatic mitral (valve) insufficiency: Secondary | ICD-10-CM

## 2016-01-02 DIAGNOSIS — R5383 Other fatigue: Secondary | ICD-10-CM | POA: Diagnosis not present

## 2016-01-02 DIAGNOSIS — J441 Chronic obstructive pulmonary disease with (acute) exacerbation: Secondary | ICD-10-CM

## 2016-01-02 DIAGNOSIS — E669 Obesity, unspecified: Secondary | ICD-10-CM | POA: Diagnosis not present

## 2016-01-02 MED ORDER — IPRATROPIUM BROMIDE 0.02 % IN SOLN
0.5000 mg | Freq: Once | RESPIRATORY_TRACT | Status: AC
  Administered 2016-01-02: 0.5 mg via RESPIRATORY_TRACT

## 2016-01-02 MED ORDER — METHYLPREDNISOLONE ACETATE 80 MG/ML IJ SUSP: 80.0000 mg | Freq: Once | INTRAMUSCULAR | Status: DC

## 2016-01-02 MED ORDER — ALBUTEROL SULFATE (2.5 MG/3ML) 0.083% IN NEBU
2.5000 mg | INHALATION_SOLUTION | Freq: Once | RESPIRATORY_TRACT | Status: AC
  Administered 2016-01-02: 2.5 mg via RESPIRATORY_TRACT

## 2016-01-02 MED ORDER — DOXYCYCLINE HYCLATE 100 MG PO TBEC: 100.0000 mg | DELAYED_RELEASE_TABLET | Freq: Two times a day (BID) | ORAL | 0 refills | Status: DC

## 2016-01-02 MED ORDER — METHYLPREDNISOLONE ACETATE 80 MG/ML IJ SUSP
80.0000 mg | Freq: Once | INTRAMUSCULAR | Status: AC
  Administered 2016-01-02: 80 mg via INTRAMUSCULAR

## 2016-01-02 NOTE — Progress Notes (Signed)
Subjective:  Patient ID: Lauren Bartlett, female    DOB: May 04, 1955  Age: 60 y.o. MRN: [id number]  CC: The primary encounter diagnosis was Fatigue, unspecified type. Diagnoses of COPD exacerbation ([place]), Obesity (BMI 30-39.9), and Severe mitral regurgitation by prior echocardiogram were also pertinent to this visit.  HPI Lauren Bartlett presents for asthma exacerbation.  Patient states that she was feeling fie yesterday but woke up this morning wheezing and coughing.  More fatigued than usual,  more short of breath with activitiy.  No fevers,  Some productive cough that has been mostly clear but occasionally purulent . Has had an increase in allergic rhinitis symptoms including sneezing for about a week.   Takes flonase and claritin daily   Reviewed of records from Pulmonology via EPIC portal note recent resolution of chronic cough which began in early July ,per OV  Sept  15 Has not been wearing supplemental 02  Except at night for the past year.   Outpatient Medications Prior to Visit  Medication Sig Dispense Refill  . albuterol (PROVENTIL HFA;VENTOLIN HFA) 108 (90 Base) MCG/ACT inhaler Inhale 2 puffs into the lungs every 6 (six) hours as needed for wheezing or shortness of breath. Pt uses with SPACER.    . fluticasone (FLONASE) 50 MCG/ACT nasal spray Place 2 sprays into both nostrils daily.    . fluticasone (FLOVENT HFA) 110 MCG/ACT inhaler Inhale 1 puff into the lungs 2 (two) times daily.    . hydrochlorothiazide (HYDRODIURIL) 25 MG tablet TAKE 1/2 TABLET(12.5 MG) BY MOUTH DAILY 45 tablet 1  . ipratropium-albuterol (DUONEB) 0.5-2.5 (3) MG/3ML SOLN Take 3 mLs by nebulization every 4 (four) hours as needed (for wheezing/shortness of breath).    Lauren Bartlett levothyroxine (SYNTHROID, LEVOTHROID) 112 MCG tablet Take 112 mcg by mouth daily before breakfast.    . loratadine-pseudoephedrine (CLARITIN-D 24-HOUR) 10-240 MG 24 hr tablet Take 1 tablet by mouth daily.    Lauren Bartlett losartan (COZAAR) 100 MG tablet TAKE 1 TABLET(100  MG) BY MOUTH DAILY 30 tablet 5  . montelukast (SINGULAIR) 10 MG tablet Take 1 tablet (10 mg total) by mouth at bedtime. 30 tablet 3  . OXYGEN Inhale 2 L into the lungs at bedtime as needed (at bedtime and as needed).    . polyethylene glycol powder (GLYCOLAX/MIRALAX) powder Take 17 g by mouth 2 (two) times daily as needed. 3350 g 1  . traMADol (ULTRAM) 50 MG tablet 1-2 tabs po q 6 hr prn pain Maximum dose= 8 tablets per day 20 tablet 0  . Umeclidinium-Vilanterol (ANORO ELLIPTA) 62.5-25 MCG/INH AEPB Inhale 1 puff into the lungs daily.     No facility-administered medications prior to visit.     Review of Systems;  Patient denies headache, fevers,  unintentional weight loss, skin rash, eye pain, sinus congestion and sinus pain, sore throat, dysphagia,  hemoptysis , chest pain, palpitations, orthopnea, edema, abdominal pain, nausea, melena, diarrhea, constipation, flank pain, dysuria, hematuria, urinary  Frequency, nocturia, numbness, tingling, seizures,  Focal weakness, Loss of consciousness,  Tremor, insomnia, depression, anxiety, and suicidal ideation.      Objective:  BP 116/64   Pulse 69   Temp 97.9 F (36.6 C) (Oral)   Resp 10   Ht 5' (1.524 m)   Wt 202 lb 8 oz (91.9 kg)   SpO2 98% Comment: On 2L 02  BMI 39.55 kg/m   BP Readings from Last 3 Encounters:  01/02/16 116/64  12/19/15 117/66  12/03/15 119/63    Wt Readings from Last 3 Encounters:  01/02/16 202 lb 8 oz (91.9 kg)  12/19/15 199 lb (90.3 kg)  12/03/15 204 lb (92.5 kg)    General appearance: alert, cooperative and appears stated age Ears: normal TM's and external ear canals both ears Throat: lips, mucosa, and tongue normal; teeth and gums normal Neck: no adenopathy, no carotid bruit, supple, symmetrical, trachea midline and thyroid not enlarged, symmetric, no tenderness/mass/nodules Back: symmetric, no curvature. ROM normal. No CVA tenderness. Lungs: clear to auscultation bilaterally Heart: regular rate and  rhythm, S1, S2 normal, no murmur, click, rub or gallop Abdomen: soft, non-tender; bowel sounds normal; no masses,  no organomegaly Pulses: 2+ and symmetric Skin: Skin color, texture, turgor normal. No rashes or lesions Lymph nodes: Cervical, supraclavicular, and axillary nodes normal.  Lab Results  Component Value Date   HGBA1C 5.4 07/28/2015    Lab Results  Component Value Date   CREATININE 0.83 10/19/2015   CREATININE 0.78 10/12/2015   CREATININE 0.69 09/07/2015    Lab Results  Component Value Date   WBC 6.3 10/12/2015   HGB 12.5 10/12/2015   HCT 38.8 10/12/2015   PLT 166.0 10/12/2015   GLUCOSE 67 (L) 10/19/2015   CHOL 173 03/14/2015   TRIG 85.0 03/14/2015   HDL 57.10 03/14/2015   LDLCALC 99 03/14/2015   ALT 12 (L) 09/04/2015   AST 17 09/04/2015   NA 140 10/19/2015   K 4.1 10/19/2015   CL 101 10/19/2015   CREATININE 0.83 10/19/2015   BUN 18 10/19/2015   CO2 32 10/19/2015   TSH 1.02 03/14/2015   INR 1.1 02/14/2013   HGBA1C 5.4 07/28/2015    No results found.  Assessment & Plan:   Problem List Items Addressed This Visit    Obesity (BMI 30-39.9)    Her weight has plateaed after a net loss of 60 lbs. ;ikely due to COPD limitations      Severe mitral regurgitation by prior echocardiogram    Stable per Dr Etta Quill last eval Nov 2016.  Has not had ECHO this year.  No signs of heart failure on today's exam      COPD exacerbation ([facility])    DepoMedrol 80 mg given IM . She does not tolerate oral prednisone secondary to agtiation and hallucinations.  Doxycyline 100 mg bid . Continue Anora and Flovent . Use Duonebs every 6 hours as needed. .       Relevant Medications   methylPREDNISolone acetate (DEPO-MEDROL) injection 80 mg (Completed)   albuterol (PROVENTIL) (2.5 MG/3ML) 0.083% nebulizer solution 2.5 mg (Completed)   ipratropium (ATROVENT) nebulizer solution 0.5 mg (Completed)   Other Relevant Orders   CBC with Differential/Platelet    Other Visit Diagnoses     Fatigue, unspecified type    -  Primary   Relevant Orders   Comprehensive metabolic panel   CBC with Differential/Platelet      I am having Ms. Kump start on doxycycline. I am also having her maintain her umeclidinium-vilanterol, loratadine-pseudoephedrine, montelukast, levothyroxine, polyethylene glycol powder, albuterol, fluticasone, ipratropium-albuterol, fluticasone, losartan, hydrochlorothiazide, OXYGEN, and traMADol. We administered methylPREDNISolone acetate, albuterol, and ipratropium.  Meds ordered this encounter  Medications  . DISCONTD: methylPREDNISolone acetate (DEPO-MEDROL) injection 80 mg  . doxycycline (DORYX) 100 MG EC tablet    Sig: Take 1 tablet (100 mg total) by mouth 2 (two) times daily.    Dispense:  14 tablet    Refill:  0  . methylPREDNISolone acetate (DEPO-MEDROL) injection 80 mg  . albuterol (PROVENTIL) (2.5 MG/3ML) 0.083% nebulizer solution 2.5 mg  .  ipratropium (ATROVENT) nebulizer solution 0.5 mg    Medications Discontinued During This Encounter  Medication Reason  . methylPREDNISolone acetate (DEPO-MEDROL) injection 80 mg     Follow-up: Return in about 2 days (around 01/04/2016), or 1:00 PM asthma follow up Wilda Wetherell OK'd .   [name], MD

## 2016-01-02 NOTE — Progress Notes (Signed)
Pre-visit discussion using our clinic review tool. No additional management support is needed unless otherwise documented below in the visit note.  

## 2016-01-02 NOTE — Telephone Encounter (Signed)
FYI - Pt called and stated that she cannot come back for her blood work this afternoon and would just like to do it when she come in Friday.    Call pt @ [phone]

## 2016-01-02 NOTE — Patient Instructions (Addendum)
  You are having an asthma exacerbation which have started from your allergies  If you become more short of breath than you are now,  You need to go to the nearest ER immediately or call 911  I gave you a shot of Depo Medrol today in the office instead of using prednisone, and a Duoneb breathing treatment  Your insurnance will not cover the cough capsules I discussed,  So continue using Delsym    Doxycycline , an antibiotic to take twice daily WITH FOOD to treat any infection that might have triggered this  Continue to use your  Anoro and Flovent,  And add the nebulizer treatment every 4 to 6 hours while you are awake  Return on Friday at 1:00 so I can make sure  you are improving     Continue using Flonase  And Singular every day for your allergies

## 2016-01-03 NOTE — Assessment & Plan Note (Signed)
Stable per Dr Etta Quill last eval Nov 2016.  Has not had ECHO this year.  No signs of heart failure on today's exam

## 2016-01-03 NOTE — Assessment & Plan Note (Signed)
Her weight has plateaed after a net loss of 60 lbs. ;ikely due to COPD limitations

## 2016-01-03 NOTE — Assessment & Plan Note (Addendum)
DepoMedrol 80 mg given IM . She does not tolerate oral prednisone secondary to agtiation and hallucinations.  Doxycyline 100 mg bid . Continue Anora and Flovent . Use Duonebs every 6 hours as needed. [name]

## 2016-01-04 ENCOUNTER — Encounter: Payer: Self-pay | Admitting: Internal Medicine

## 2016-01-04 ENCOUNTER — Ambulatory Visit (INDEPENDENT_AMBULATORY_CARE_PROVIDER_SITE_OTHER): Payer: Medicare Other | Admitting: Internal Medicine

## 2016-01-04 DIAGNOSIS — J441 Chronic obstructive pulmonary disease with (acute) exacerbation: Secondary | ICD-10-CM

## 2016-01-04 DIAGNOSIS — R5383 Other fatigue: Secondary | ICD-10-CM

## 2016-01-04 LAB — CBC WITH DIFFERENTIAL/PLATELET
Basophils Absolute: 0 10*3/uL (ref 0.0–0.1)
Basophils Relative: 0.6 % (ref 0.0–3.0)
EOS ABS: 0.3 10*3/uL (ref 0.0–0.7)
EOS PCT: 5 % (ref 0.0–5.0)
HCT: 36.9 % (ref 36.0–46.0)
HEMOGLOBIN: 12 g/dL (ref 12.0–15.0)
LYMPHS ABS: 1.4 10*3/uL (ref 0.7–4.0)
Lymphocytes Relative: 20.6 % (ref 12.0–46.0)
MCHC: 32.6 g/dL (ref 30.0–36.0)
MCV: 90.3 fl (ref 78.0–100.0)
MONO ABS: 0.5 10*3/uL (ref 0.1–1.0)
Monocytes Relative: 8 % (ref 3.0–12.0)
NEUTROS PCT: 65.8 % (ref 43.0–77.0)
Neutro Abs: 4.5 10*3/uL (ref 1.4–7.7)
Platelets: 168 10*3/uL (ref 150.0–400.0)
RBC: 4.09 Mil/uL (ref 3.87–5.11)
RDW: 14.3 % (ref 11.5–15.5)
WBC: 6.8 10*3/uL (ref 4.0–10.5)

## 2016-01-04 LAB — COMPREHENSIVE METABOLIC PANEL
ALBUMIN: 4 g/dL (ref 3.5–5.2)
ALK PHOS: 68 U/L (ref 39–117)
ALT: 13 U/L (ref 0–35)
AST: 16 U/L (ref 0–37)
BUN: 18 mg/dL (ref 6–23)
CO2: 31 mEq/L (ref 19–32)
CREATININE: 0.83 mg/dL (ref 0.40–1.20)
Calcium: 8.9 mg/dL (ref 8.4–10.5)
Chloride: 101 mEq/L (ref 96–112)
GFR: 74.51 mL/min (ref >60.00)
GLUCOSE: 82 mg/dL (ref 70–99)
Potassium: 4.2 mEq/L (ref 3.5–5.1)
SODIUM: 139 meq/L (ref 135–145)
TOTAL PROTEIN: 7.2 g/dL (ref 6.0–8.3)
Total Bilirubin: 0.5 mg/dL (ref 0.2–1.2)

## 2016-01-04 MED ORDER — METHYLPREDNISOLONE ACETATE 80 MG/ML IJ SUSP
80.0000 mg | Freq: Once | INTRAMUSCULAR | Status: AC
  Administered 2016-01-04: 40 mg via INTRAMUSCULAR

## 2016-01-04 NOTE — Progress Notes (Signed)
Pre visit review using our clinic review tool, if applicable. No additional management support is needed unless otherwise documented below in the visit note. 

## 2016-01-04 NOTE — Patient Instructions (Signed)
Your lungs sound much better!  I have given you an additional dose of steroid today to make sure you don't have a recurrent flare

## 2016-01-05 NOTE — Assessment & Plan Note (Signed)
Her hypoxia has resolved and her lung exam has improved  .  will give one more IM injection of Depo Medrol  40 mg .  Continue antibiotics and nebulized steroids/bronchodilators.

## 2016-01-05 NOTE — Progress Notes (Signed)
Subjective:  Patient ID: Lauren Bartlett, female    DOB: [birth year]  Age: 60 y.o. MRN: [id number]  CC: Diagnoses of Fatigue, unspecified type and COPD exacerbation ([place]) were pertinent to this visit.  HPI Lauren Bartlett presents for 2 day follow up on COPD exacerbation .  She has been using her albuterol nebulizer and taking the  antibiotic as prescribed.  Feels less fatigued that=n two days ago and wheezing less.  Some nighttime insomnia secondary to Depo Medrol nut still able to sleep.    Outpatient Medications Prior to Visit  Medication Sig Dispense Refill  . albuterol (PROVENTIL HFA;VENTOLIN HFA) 108 (90 Base) MCG/ACT inhaler Inhale 2 puffs into the lungs every 6 (six) hours as needed for wheezing or shortness of breath. Pt uses with SPACER.    [name] doxycycline (DORYX) 100 MG EC tablet Take 1 tablet (100 mg total) by mouth 2 (two) times daily. 14 tablet 0  . fluticasone (FLONASE) 50 MCG/ACT nasal spray Place 2 sprays into both nostrils daily.    . fluticasone (FLOVENT HFA) 110 MCG/ACT inhaler Inhale 1 puff into the lungs 2 (two) times daily.    . hydrochlorothiazide (HYDRODIURIL) 25 MG tablet TAKE 1/2 TABLET(12.5 MG) BY MOUTH DAILY 45 tablet 1  . ipratropium-albuterol (DUONEB) 0.5-2.5 (3) MG/3ML SOLN Take 3 mLs by nebulization every 4 (four) hours as needed (for wheezing/shortness of breath).    [name] levothyroxine (SYNTHROID, LEVOTHROID) 112 MCG tablet Take 112 mcg by mouth daily before breakfast.    . loratadine-pseudoephedrine (CLARITIN-D 24-HOUR) 10-240 MG 24 hr tablet Take 1 tablet by mouth daily.    [name] losartan (COZAAR) 100 MG tablet TAKE 1 TABLET(100 MG) BY MOUTH DAILY 30 tablet 5  . montelukast (SINGULAIR) 10 MG tablet Take 1 tablet (10 mg total) by mouth at bedtime. 30 tablet 3  . OXYGEN Inhale 2 L into the lungs at bedtime as needed (at bedtime and as needed).    . polyethylene glycol powder (GLYCOLAX/MIRALAX) powder Take 17 g by mouth 2 (two) times daily as needed. 3350 g 1  . traMADol  (ULTRAM) 50 MG tablet 1-2 tabs po q 6 hr prn pain Maximum dose= 8 tablets per day 20 tablet 0  . Umeclidinium-Vilanterol (ANORO ELLIPTA) 62.5-25 MCG/INH AEPB Inhale 1 puff into the lungs daily.     No facility-administered medications prior to visit.     Review of Systems;  Patient denies headache, fevers, malaise, unintentional weight loss, skin rash, eye pain, sinus congestion and sinus pain, sore throat, dysphagia,  hemoptysis , cough, dyspnea, wheezing, chest pain, palpitations, orthopnea, edema, abdominal pain, nausea, melena, diarrhea, constipation, flank pain, dysuria, hematuria, urinary  Frequency, nocturia, numbness, tingling, seizures,  Focal weakness, Loss of consciousness,  Tremor, insomnia, depression, anxiety, and suicidal ideation.      Objective:  BP 128/82   Pulse 76   Temp 98.4 F (36.9 C) (Oral)   Ht 5' (1.524 m)   Wt 202 lb (91.6 kg)   SpO2 96%   BMI 39.45 kg/m   BP Readings from Last 3 Encounters:  01/04/16 128/82  01/02/16 116/64  12/19/15 117/66    Wt Readings from Last 3 Encounters:  01/04/16 202 lb (91.6 kg)  01/02/16 202 lb 8 oz (91.9 kg)  12/19/15 199 lb (90.3 kg)    General appearance: alert, cooperative and appears stated age Ears: normal TM's and external ear canals both ears Throat: lips, mucosa, and tongue normal; teeth and gums normal Neck: no adenopathy, no carotid bruit, supple, symmetrical, trachea  midline and thyroid not enlarged, symmetric, no tenderness/mass/nodules Back: symmetric, no curvature. ROM normal. No CVA tenderness. Lungs: clear to auscultation bilaterally Heart: regular rate and rhythm, S1, S2 normal, no murmur, click, rub or gallop Abdomen: soft, non-tender; bowel sounds normal; no masses,  no organomegaly Pulses: 2+ and symmetric Skin: Skin color, texture, turgor normal. No rashes or lesions Lymph nodes: Cervical, supraclavicular, and axillary nodes normal.  Lab Results  Component Value Date   HGBA1C 5.4 07/28/2015      Lab Results  Component Value Date   CREATININE 0.83 01/04/2016   CREATININE 0.83 10/19/2015   CREATININE 0.78 10/12/2015    Lab Results  Component Value Date   WBC 6.8 01/04/2016   HGB 12.0 01/04/2016   HCT 36.9 01/04/2016   PLT 168.0 01/04/2016   GLUCOSE 82 01/04/2016   CHOL 173 03/14/2015   TRIG 85.0 03/14/2015   HDL 57.10 03/14/2015   LDLCALC 99 03/14/2015   ALT 13 01/04/2016   AST 16 01/04/2016   NA 139 01/04/2016   K 4.2 01/04/2016   CL 101 01/04/2016   CREATININE 0.83 01/04/2016   BUN 18 01/04/2016   CO2 31 01/04/2016   TSH 1.02 03/14/2015   INR 1.1 02/14/2013   HGBA1C 5.4 07/28/2015    No results found.  Assessment & Plan:   Problem List Items Addressed This Visit    COPD exacerbation ([place])    Her hypoxia has resolved and her lung exam has improved  .  will give one more IM injection of Depo Medrol  40 mg .  Continue antibiotics and nebulized steroids/bronchodilators.       Relevant Medications   methylPREDNISolone acetate (DEPO-MEDROL) injection 80 mg (Completed)    Other Visit Diagnoses    Fatigue, unspecified type       Relevant Medications   methylPREDNISolone acetate (DEPO-MEDROL) injection 80 mg (Completed)      I am having [name] maintain her umeclidinium-vilanterol, loratadine-pseudoephedrine, montelukast, levothyroxine, polyethylene glycol powder, albuterol, fluticasone, ipratropium-albuterol, fluticasone, losartan, hydrochlorothiazide, OXYGEN, traMADol, and doxycycline. We administered methylPREDNISolone acetate.  Meds ordered this encounter  Medications  . methylPREDNISolone acetate (DEPO-MEDROL) injection 80 mg    There are no discontinued medications.  Follow-up: No Follow-up on file.   [name], MD

## 2016-01-08 ENCOUNTER — Encounter: Payer: Self-pay | Admitting: *Deleted

## 2016-01-10 DIAGNOSIS — I341 Nonrheumatic mitral (valve) prolapse: Secondary | ICD-10-CM | POA: Diagnosis not present

## 2016-01-10 DIAGNOSIS — R011 Cardiac murmur, unspecified: Secondary | ICD-10-CM | POA: Diagnosis not present

## 2016-01-10 DIAGNOSIS — I10 Essential (primary) hypertension: Secondary | ICD-10-CM | POA: Diagnosis not present

## 2016-01-10 DIAGNOSIS — E669 Obesity, unspecified: Secondary | ICD-10-CM | POA: Diagnosis not present

## 2016-01-10 DIAGNOSIS — G4733 Obstructive sleep apnea (adult) (pediatric): Secondary | ICD-10-CM | POA: Diagnosis not present

## 2016-01-10 DIAGNOSIS — J449 Chronic obstructive pulmonary disease, unspecified: Secondary | ICD-10-CM | POA: Diagnosis not present

## 2016-01-10 DIAGNOSIS — I272 Pulmonary hypertension, unspecified: Secondary | ICD-10-CM | POA: Diagnosis not present

## 2016-01-10 DIAGNOSIS — E079 Disorder of thyroid, unspecified: Secondary | ICD-10-CM | POA: Diagnosis not present

## 2016-01-10 DIAGNOSIS — R0602 Shortness of breath: Secondary | ICD-10-CM | POA: Diagnosis not present

## 2016-01-14 ENCOUNTER — Telehealth: Payer: Self-pay | Admitting: *Deleted

## 2016-01-14 DIAGNOSIS — R0902 Hypoxemia: Principal | ICD-10-CM

## 2016-01-14 DIAGNOSIS — J9611 Chronic respiratory failure with hypoxia: Secondary | ICD-10-CM

## 2016-01-14 DIAGNOSIS — J449 Chronic obstructive pulmonary disease, unspecified: Secondary | ICD-10-CM

## 2016-01-14 NOTE — Telephone Encounter (Signed)
Void  

## 2016-01-14 NOTE — Telephone Encounter (Signed)
Would you like to refer home health or [facility], last OV was 01/04/16 ? Patient having trouble completing ADLS , Starts bath and becomes to  Tired, needs help with cooking for meals. Patient is having no acute symptom's.

## 2016-01-14 NOTE — Telephone Encounter (Signed)
Patient has requested have home health, she currently finds it difficult to do things for herself. She requested to start this process, or have an appt scheduled to be seen.  Pt contact [phone]

## 2016-01-15 NOTE — Telephone Encounter (Signed)
Home health referral in process 

## 2016-01-15 NOTE — Telephone Encounter (Signed)
Patient refused the PACE program , stating she just wants help with ADL's and cleaning.

## 2016-01-15 NOTE — Telephone Encounter (Signed)
Yes, referral to PACE is a great idean,.  Do you need an order in EPIC for home health to make that happen?

## 2016-01-16 NOTE — Telephone Encounter (Signed)
Patient notified

## 2016-01-17 DIAGNOSIS — J9611 Chronic respiratory failure with hypoxia: Secondary | ICD-10-CM | POA: Diagnosis not present

## 2016-01-17 DIAGNOSIS — J441 Chronic obstructive pulmonary disease with (acute) exacerbation: Secondary | ICD-10-CM | POA: Diagnosis not present

## 2016-01-21 ENCOUNTER — Telehealth: Payer: Self-pay | Admitting: *Deleted

## 2016-01-21 DIAGNOSIS — J441 Chronic obstructive pulmonary disease with (acute) exacerbation: Secondary | ICD-10-CM | POA: Diagnosis not present

## 2016-01-21 DIAGNOSIS — J9611 Chronic respiratory failure with hypoxia: Secondary | ICD-10-CM | POA: Diagnosis not present

## 2016-01-21 NOTE — Telephone Encounter (Signed)
[facility] home [facility] has requested verbal orders for Patient to receive physical therapy 2 times a week for 3 weeks to work on strength, balance and gait. He would also like to have occupational therapy  Please contact Skeet Simmer from [facility]

## 2016-01-22 DIAGNOSIS — J9611 Chronic respiratory failure with hypoxia: Secondary | ICD-10-CM | POA: Diagnosis not present

## 2016-01-22 DIAGNOSIS — J441 Chronic obstructive pulmonary disease with (acute) exacerbation: Secondary | ICD-10-CM | POA: Diagnosis not present

## 2016-01-22 NOTE — Telephone Encounter (Signed)
Verbal given 

## 2016-01-24 ENCOUNTER — Telehealth: Payer: Self-pay | Admitting: *Deleted

## 2016-01-24 DIAGNOSIS — J9611 Chronic respiratory failure with hypoxia: Secondary | ICD-10-CM | POA: Diagnosis not present

## 2016-01-24 DIAGNOSIS — J441 Chronic obstructive pulmonary disease with (acute) exacerbation: Secondary | ICD-10-CM | POA: Diagnosis not present

## 2016-01-24 NOTE — Telephone Encounter (Signed)
Cindy from [facility] has requested a call in regards to a form sent over by [facility] by an [facility].  [employer] [phone]

## 2016-01-25 NOTE — Telephone Encounter (Signed)
Cindy notified form faxed to [facility] by and [facility].

## 2016-01-25 NOTE — Telephone Encounter (Signed)
Left message to call office

## 2016-01-28 DIAGNOSIS — J9611 Chronic respiratory failure with hypoxia: Secondary | ICD-10-CM | POA: Diagnosis not present

## 2016-01-28 DIAGNOSIS — J441 Chronic obstructive pulmonary disease with (acute) exacerbation: Secondary | ICD-10-CM | POA: Diagnosis not present

## 2016-01-29 DIAGNOSIS — J441 Chronic obstructive pulmonary disease with (acute) exacerbation: Secondary | ICD-10-CM | POA: Diagnosis not present

## 2016-01-29 DIAGNOSIS — J9611 Chronic respiratory failure with hypoxia: Secondary | ICD-10-CM | POA: Diagnosis not present

## 2016-01-31 ENCOUNTER — Telehealth: Payer: Self-pay | Admitting: Internal Medicine

## 2016-01-31 DIAGNOSIS — J449 Chronic obstructive pulmonary disease, unspecified: Secondary | ICD-10-CM | POA: Diagnosis not present

## 2016-01-31 NOTE — Telephone Encounter (Signed)
Pt called and stated that she needs a referral sent over to [facility], a paper should have been sent over from [place]. They need it completed and faxed to them, in order to start home care.  Thank you!  Call pt @ [phone]

## 2016-02-01 NOTE — Telephone Encounter (Signed)
Form faxed the second time today to [facility].

## 2016-02-06 DIAGNOSIS — J449 Chronic obstructive pulmonary disease, unspecified: Secondary | ICD-10-CM | POA: Diagnosis not present

## 2016-02-11 ENCOUNTER — Ambulatory Visit (INDEPENDENT_AMBULATORY_CARE_PROVIDER_SITE_OTHER): Payer: Medicare Other

## 2016-02-11 VITALS — BP 120/64 | HR 83 | Temp 97.7°F | Resp 20 | Ht 60.0 in | Wt 206.4 lb

## 2016-02-11 DIAGNOSIS — Z0000 Encounter for general adult medical examination without abnormal findings: Secondary | ICD-10-CM

## 2016-02-11 NOTE — Patient Instructions (Addendum)
Ms. Callicutt , Thank you for taking time to come for your Medicare Wellness Visit. I appreciate your ongoing commitment to your health goals. Please review the following plan we discussed and let me know if I can assist you in the future.   These are the goals we discussed: Goals    . Healthy Lifestyle          WEAR OXYGEN WHEN LEAVING HOME AND AS NEEDED.  CHAIR/BIKE EXERCISES WHEN UNABLE TO WALK OUTSIDE.  LOW CARB FOODS.  LEAN MEATS, VEGETABLES.       This is a list of the screening recommended for you and due dates:  Health Maintenance  Topic Date Due  . Pap Smear  12/04/1976  . Colon Cancer Screening  12/04/2005  . Shingles Vaccine  12/05/2015  . Flu Shot  06/27/2016*  . Mammogram  10/24/2017  . Tetanus Vaccine  11/13/2019  .  Hepatitis C: One time screening is recommended by Center for Disease Control  (CDC) for  adults born from [birth year] through 1965.   Completed  . HIV Screening  Completed  *Topic was postponed. The date shown is not the original due date.    Colonoscopy A colonoscopy is an exam to look at the entire large intestine (colon). This exam can help find problems such as tumors, polyps, inflammation, and areas of bleeding. The exam takes about 1 hour.  LET [facility] CARE PROVIDER KNOW ABOUT:   Any allergies you have.  All medicines you are taking, including vitamins, herbs, eye drops, creams, and over-the-counter medicines.  Previous problems you or members of your family have had with the use of anesthetics.  Any blood disorders you have.  Previous surgeries you have had.  Medical conditions you have. RISKS AND COMPLICATIONS  Generally, this is a safe procedure. However, as with any procedure, complications can occur. Possible complications include:  Bleeding.  Tearing or rupture of the colon wall.  Reaction to medicines given during the exam.  Infection (rare). BEFORE THE PROCEDURE   Ask your health care provider about changing or stopping your  regular medicines.  You may be prescribed an oral bowel prep. This involves drinking a large amount of medicated liquid, starting the day before your procedure. The liquid will cause you to have multiple loose stools until your stool is almost clear or light green. This cleans out your colon in preparation for the procedure.  Do not eat or drink anything else once you have started the bowel prep, unless your health care provider tells you it is safe to do so.  Arrange for someone to drive you home after the procedure. PROCEDURE   You will be given medicine to help you relax (sedative).  You will lie on your side with your knees bent.  A long, flexible tube with a light and camera on the end (colonoscope) will be inserted through the rectum and into the colon. The camera sends video back to a computer screen as it moves through the colon. The colonoscope also releases carbon dioxide gas to inflate the colon. This helps your health care provider see the area better.  During the exam, your health care provider may take a small tissue sample (biopsy) to be examined under a microscope if any abnormalities are found.  The exam is finished when the entire colon has been viewed. AFTER THE PROCEDURE   Do not drive for 24 hours after the exam.  You may have a small amount of blood in your stool.  You may pass moderate amounts of gas and have mild abdominal cramping or bloating. This is caused by the gas used to inflate your colon during the exam.  Ask when your test results will be ready and how you will get your results. Make sure you get your test results.   This information is not intended to replace advice given to you by your health care provider. Make sure you discuss any questions you have with your health care provider.   Document Released: 03/14/2000 Document Revised: 01/05/2013 Document Reviewed: 11/22/2012 Elsevier Interactive Patient Education [employer].

## 2016-02-11 NOTE — Progress Notes (Signed)
Subjective:   Lauren Bartlett is a 60 y.o. female who presents for Medicare Annual (Subsequent) preventive examination.  Review of Systems:  No ROS.  Medicare Wellness Visit.  Cardiac Risk Factors include: advanced age (>61men, >6 women);hypertension;obesity (BMI >30kg/m2)     Objective:     Vitals: BP 120/64 (BP Location: Left Arm, Patient Position: Sitting, Cuff Size: Normal)   Pulse 83   Temp 97.7 F (36.5 C) (Oral)   Resp 20   Ht 5' (1.524 m)   Wt 206 lb 6.4 oz (93.6 kg)   SpO2 98%   BMI 40.31 kg/m   Body mass index is 40.31 kg/m.   Tobacco History  Smoking Status  . Never Smoker  Smokeless Tobacco  . Never Used     Counseling given: Not Answered   Past Medical History:  Diagnosis Date  . Anxiety   . Congenital absence of one kidney   . COPD (chronic obstructive pulmonary disease) ([place])   . Depression    treated at [place]  . Emphysema/COPD ([place])   . History of sciatica   . Hypertension   . hypothyrodism   . Hypothyroidism   . Motion sickness    cars  . MVP (mitral valve prolapse)   . PONV (postoperative nausea and vomiting)   . Pulmonary hypertension   . Sleep apnea    O2 at night and PRN   Past Surgical History:  Procedure Laterality Date  . ABDOMINAL HYSTERECTOMY    . BREAST BIOPSY Right 2011   [facility]< benign  . CATARACT EXTRACTION W/PHACO Left 11/14/2015   Procedure: CATARACT EXTRACTION PHACO AND INTRAOCULAR LENS PLACEMENT (IOC);  Surgeon: Leandrew Koyanagi, MD;  Location: [facility];  Service: Ophthalmology;  Laterality: Left;  sleep apnea [place]  . CATARACT EXTRACTION W/PHACO Right 12/19/2015   Procedure: CATARACT EXTRACTION PHACO AND INTRAOCULAR LENS PLACEMENT (IOC);  Surgeon: Leandrew Koyanagi, MD;  Location: [facility];  Service: Ophthalmology;  Laterality: Right;  ANXIETY GENEROUS IV SEDATION TORIC LEN  . COMBINED HYSTERECTOMY ABDOMINAL W/ A&P REPAIR / OOPHORECTOMY  1996   benign tumor  . INNER EAR SURGERY     bilateral   Family History  Problem Relation Age of Onset  . Multiple sclerosis Mother   . Coronary artery disease Father   . Heart disease Brother   . Breast cancer Paternal Aunt    History  Sexual Activity  . Sexual activity: Not Currently    Outpatient Encounter Prescriptions as of 02/11/2016  Medication Sig  . albuterol (PROVENTIL HFA;VENTOLIN HFA) 108 (90 Base) MCG/ACT inhaler Inhale 2 puffs into the lungs every 6 (six) hours as needed for wheezing or shortness of breath. Pt uses with SPACER.  [name] doxycycline (DORYX) 100 MG EC tablet Take 1 tablet (100 mg total) by mouth 2 (two) times daily.  . fluticasone (FLONASE) 50 MCG/ACT nasal spray Place 2 sprays into both nostrils daily.  . fluticasone (FLOVENT HFA) 110 MCG/ACT inhaler Inhale 1 puff into the lungs 2 (two) times daily.  . hydrochlorothiazide (HYDRODIURIL) 25 MG tablet TAKE 1/2 TABLET(12.5 MG) BY MOUTH DAILY  . ipratropium-albuterol (DUONEB) 0.5-2.5 (3) MG/3ML SOLN Take 3 mLs by nebulization every 4 (four) hours as needed (for wheezing/shortness of breath).  [name] levothyroxine (SYNTHROID, LEVOTHROID) 112 MCG tablet Take 112 mcg by mouth daily before breakfast.  . loratadine-pseudoephedrine (CLARITIN-D 24-HOUR) 10-240 MG 24 hr tablet Take 1 tablet by mouth daily.  [name] losartan (COZAAR) 100 MG tablet TAKE 1 TABLET(100 MG) BY MOUTH DAILY  .  montelukast (SINGULAIR) 10 MG tablet Take 1 tablet (10 mg total) by mouth at bedtime.  . OXYGEN Inhale 2 L into the lungs at bedtime as needed (at bedtime and as needed).  . polyethylene glycol powder (GLYCOLAX/MIRALAX) powder Take 17 g by mouth 2 (two) times daily as needed.  . traMADol (ULTRAM) 50 MG tablet 1-2 tabs po q 6 hr prn pain Maximum dose= 8 tablets per day  . Umeclidinium-Vilanterol (ANORO ELLIPTA) 62.5-25 MCG/INH AEPB Inhale 1 puff into the lungs daily.   No facility-administered encounter medications on file as of 02/11/2016.     Activities of Daily Living In your present state of  health, do you have any difficulty performing the following activities: 02/11/2016 12/19/2015  Hearing? Y N  Vision? N N  Difficulty concentrating or making decisions? N N  Walking or climbing stairs? Y N  Dressing or bathing? N N  Doing errands, shopping? N -  Preparing Food and eating ? N -  Using the Toilet? N -  In the past six months, have you accidently leaked urine? N -  Do you have problems with loss of bowel control? N -  Managing your Medications? N -  Managing your Finances? N -  Housekeeping or managing your Housekeeping? N -  Some recent data might be hidden    Patient Care Team: [name], MD as PCP - General (Internal Medicine)    Assessment:    This is a routine wellness examination for Avary. The goal of the wellness visit is to assist the patient how to close the gaps in care and create a preventative care plan for the patient.   Osteoporosis risk reviewed.  Medications reviewed; taking without issues or barriers.  Safety issues reviewed; lives alone. Smoke detectors in the home. No firearms in the home. Wears seatbelts when driving or riding with others. No violence in the home.  No identified risk were noted; The patient was oriented x 3; appropriate in dress and manner and no objective failures at ADL's or IADL's. Uses walker within the home, uses a cane outside of the home.    Body mass index; discussed the importance of a healthy diet, water intake and exercise. Educational material provided.  Patient Concerns: States she feels like she has head congestion and fears she may soon have a COPD flare up. No cough present.  Currently taking Doxycycline BID, 4 days.  Lung sounds clear per RN. Vitals within range.  Awaiting follow up from home health referral; referral coordinator aware.  Follow up appointment scheduled with PCP.  Exercise Activities and Dietary recommendations Current Exercise Habits: Home exercise routine, Type of exercise: walking,  Time (Minutes): 30, Intensity: Mild  Goals    . Healthy Lifestyle          WEAR OXYGEN WHEN LEAVING HOME AND AS NEEDED.  CHAIR/BIKE EXERCISES WHEN UNABLE TO WALK OUTSIDE.  LOW CARB FOODS.  LEAN MEATS, VEGETABLES.      Fall Risk Fall Risk  02/11/2016 03/13/2014  Falls in the past year? No No   Depression Screen PHQ 2/9 Scores 02/11/2016 03/13/2014  PHQ - 2 Score 0 2  PHQ- 9 Score - 3     Cognitive Function MMSE - Mini Mental State Exam 02/11/2016  Orientation to time 5  Orientation to Place 5  Registration 3  Attention/ Calculation 5  Recall 3  Language- name 2 objects 2  Language- repeat 1  Language- follow 3 step command 3  Language- read & follow  direction 1  Write a sentence 1  Copy design 1  Total score 30     6CIT Screen 02/11/2016  What Year? 0 points  What month? 0 points  What time? 0 points  Count back from 20 0 points  Months in reverse 0 points    Immunization History  Administered Date(s) Administered  . Influenza Split 01/02/2012  . Influenza,inj,Quad PF,36+ Mos 03/13/2014, 01/08/2015  . Influenza-Unspecified 01/11/2013  . Pneumococcal Conjugate-13 02/27/2015  . Pneumococcal Polysaccharide-23 03/07/2013  . Tdap 11/12/2009   Screening Tests Health Maintenance  Topic Date Due  . PAP SMEAR  12/04/1976  . COLONOSCOPY  12/04/2005  . ZOSTAVAX  12/05/2015  . INFLUENZA VACCINE  06/27/2016 (Originally 10/30/2015)  . MAMMOGRAM  10/24/2017  . TETANUS/TDAP  11/13/2019  . Hepatitis C Screening  Completed  . HIV Screening  Completed      Plan:    End of life planning; Advance aging; Advanced directives discussed. No Copy of current HCPOA/Living Will requested.  Medicare Attestation I have personally reviewed: The patient's medical and social history Their use of alcohol, tobacco or illicit drugs Their current medications and supplements The patient's functional ability including ADLs,fall risks, home safety risks, cognitive, and hearing and  visual impairment Diet and physical activities Evidence for depression   The patient's weight, height, BMI, and visual acuity have been recorded in the chart.  I have made referrals and provided education to the patient based on review of the above and I have provided the patient with a written personalized care plan for preventive services.    During the course of the visit the patient was educated and counseled about the following appropriate screening and preventive services:   Vaccines to include Pneumoccal, Influenza, Hepatitis B, Td, Zostavax, HCV  Electrocardiogram  Cardiovascular Disease  Colorectal cancer screening  Bone density screening  Diabetes screening  Glaucoma screening  Mammography/PAP  Nutrition counseling   Patient Instructions (the written plan) was given to the patient.   Varney Biles, LPN  [id number]

## 2016-02-13 ENCOUNTER — Encounter: Payer: Self-pay | Admitting: Internal Medicine

## 2016-02-13 ENCOUNTER — Ambulatory Visit (INDEPENDENT_AMBULATORY_CARE_PROVIDER_SITE_OTHER): Payer: Medicare Other | Admitting: Internal Medicine

## 2016-02-13 ENCOUNTER — Telehealth: Payer: Self-pay | Admitting: Internal Medicine

## 2016-02-13 ENCOUNTER — Ambulatory Visit (INDEPENDENT_AMBULATORY_CARE_PROVIDER_SITE_OTHER): Payer: Medicare Other

## 2016-02-13 VITALS — BP 120/68 | HR 84 | Temp 97.9°F | Resp 12 | Ht 60.0 in | Wt 206.8 lb

## 2016-02-13 DIAGNOSIS — J4541 Moderate persistent asthma with (acute) exacerbation: Secondary | ICD-10-CM

## 2016-02-13 DIAGNOSIS — R062 Wheezing: Secondary | ICD-10-CM | POA: Diagnosis not present

## 2016-02-13 DIAGNOSIS — I272 Pulmonary hypertension, unspecified: Secondary | ICD-10-CM

## 2016-02-13 DIAGNOSIS — J441 Chronic obstructive pulmonary disease with (acute) exacerbation: Secondary | ICD-10-CM

## 2016-02-13 DIAGNOSIS — J9611 Chronic respiratory failure with hypoxia: Secondary | ICD-10-CM | POA: Diagnosis not present

## 2016-02-13 DIAGNOSIS — E669 Obesity, unspecified: Secondary | ICD-10-CM

## 2016-02-13 DIAGNOSIS — I340 Nonrheumatic mitral (valve) insufficiency: Secondary | ICD-10-CM | POA: Diagnosis not present

## 2016-02-13 DIAGNOSIS — J9601 Acute respiratory failure with hypoxia: Secondary | ICD-10-CM

## 2016-02-13 NOTE — Progress Notes (Signed)
Pre-visit discussion using our clinic review tool. No additional management support is needed unless otherwise documented below in the visit note.  

## 2016-02-13 NOTE — Assessment & Plan Note (Addendum)
She is still wheezing on exam but no longer hypoxic  Currently on Doxycyline,  Chest x ray shows a possible right mid lung infiltrate.  Given her prolonged symptoms,  Will recommend CT chest non contrast.

## 2016-02-13 NOTE — Assessment & Plan Note (Signed)
She has gained 11 lbs this summer after losing 60 lbs,  because her dyspnea has prevented her from resuming exercise.  Recommended Silver Sneakers participation .

## 2016-02-13 NOTE — Patient Instructions (Signed)
Consider seeing Dr Tami Ribas for your chronic sinus congestion

## 2016-02-13 NOTE — Assessment & Plan Note (Addendum)
I am concerned that her mitral regurgitation may be playing a signficant role in her exercise intolerance. She has pulmonary hypertension, COPD  and obesity as well.

## 2016-02-13 NOTE — Progress Notes (Signed)
Subjective:  Patient ID: Lauren Bartlett, female    DOB: 11-Sep-1955  Age: 60 y.o. MRN: [id number]  CC: The primary encounter diagnosis was Moderate persistent asthma with acute exacerbation. Diagnoses of Severe mitral regurgitation by prior echocardiogram, Obesity (BMI 30-39.9), COPD exacerbation (HCC), Chronic respiratory failure with hypoxia ([place]), Acute respiratory failure with hypoxia ([place]), and Pulmonary hypertension ([place]) were also pertinent to this visit.  HPI Amanah Garger presents for follow up on COPD exacerbation .  Patient was treated in early October ,wth doxycycline and Depo medrol, and advised to continue use of Anoro and Flovent inhaled therapies. On her 2 day follow up and was given an additional a dose of depo medrol.   Was still short of breath during annual cardiology visit with  [facility]  On Oct 12 .  Was  referred to pulmonology , Not seen by Dr Janetta Hora September despite persistent symptoms. (His office notes were reviewed),  Doxycycline therapy was repeated by Dr Raul Del (was called in on November 9) . She notes some improvement with  Second course of antibiotics.  Cough is productive of white sputum. [name] Has   Persistent sinus congestion and cough is worse when supine.    Overall she reports feeling better and is no longer requiring sipplemental oxygen for daytime use unless walking or shopping.  Wears 2 L at night .  Still not able to resume exercise, however,  due to dyspnea, and has gained 11 lbs,after reaching a nadir of 191 lbs in  April 2017 from a high of 243 lbs in 2013  She is concerned about the weight gain    Her most recent ECHO noted mitral regurgitation , severe, with normal EF by May 2017  Testing       She is compliant with COPD meds. Using Flovent and Anoro Dr Raul Del.  Along with Pro air every 6 hours.    History of Recurrent nose bleeds on the left side.  Prior cauterization by Dr Tami Ribas .  Feels constantly congested and cough is worse when supine.       Outpatient Medications Prior to Visit  Medication Sig Dispense Refill  . albuterol (PROVENTIL HFA;VENTOLIN HFA) 108 (90 Base) MCG/ACT inhaler Inhale 2 puffs into the lungs every 6 (six) hours as needed for wheezing or shortness of breath. Pt uses with SPACER.    [name] doxycycline (DORYX) 100 MG EC tablet Take 1 tablet (100 mg total) by mouth 2 (two) times daily. 14 tablet 0  . fluticasone (FLONASE) 50 MCG/ACT nasal spray Place 2 sprays into both nostrils daily.    . fluticasone (FLOVENT HFA) 110 MCG/ACT inhaler Inhale 1 puff into the lungs 2 (two) times daily.    . hydrochlorothiazide (HYDRODIURIL) 25 MG tablet TAKE 1/2 TABLET(12.5 MG) BY MOUTH DAILY 45 tablet 1  . ipratropium-albuterol (DUONEB) 0.5-2.5 (3) MG/3ML SOLN Take 3 mLs by nebulization every 4 (four) hours as needed (for wheezing/shortness of breath).    [name] levothyroxine (SYNTHROID, LEVOTHROID) 112 MCG tablet Take 112 mcg by mouth daily before breakfast.    . loratadine-pseudoephedrine (CLARITIN-D 24-HOUR) 10-240 MG 24 hr tablet Take 1 tablet by mouth daily.    [name] losartan (COZAAR) 100 MG tablet TAKE 1 TABLET(100 MG) BY MOUTH DAILY 30 tablet 5  . montelukast (SINGULAIR) 10 MG tablet Take 1 tablet (10 mg total) by mouth at bedtime. 30 tablet 3  . OXYGEN Inhale 2 L into the lungs at bedtime as needed (at bedtime and as needed).    . polyethylene  glycol powder (GLYCOLAX/MIRALAX) powder Take 17 g by mouth 2 (two) times daily as needed. 3350 g 1  . traMADol (ULTRAM) 50 MG tablet 1-2 tabs po q 6 hr prn pain Maximum dose= 8 tablets per day 20 tablet 0  . Umeclidinium-Vilanterol (ANORO ELLIPTA) 62.5-25 MCG/INH AEPB Inhale 1 puff into the lungs daily.     No facility-administered medications prior to visit.     Review of Systems;  Patient denies headache, fevers, malaise, unintentional weight loss, skin rash, eye pain, sinus congestion and sinus pain, sore throat, dysphagia,  hemoptysis , cough, dyspnea, wheezing, chest pain, palpitations,  orthopnea, edema, abdominal pain, nausea, melena, diarrhea, constipation, flank pain, dysuria, hematuria, urinary  Frequency, nocturia, numbness, tingling, seizures,  Focal weakness, Loss of consciousness,  Tremor, insomnia, depression, anxiety, and suicidal ideation.      Objective:  BP 120/68   Pulse 84   Temp 97.9 F (36.6 C) (Oral)   Resp 12   Ht 5' (1.524 m)   Wt 206 lb 12 oz (93.8 kg)   SpO2 96%   BMI 40.38 kg/m   BP Readings from Last 3 Encounters:  02/13/16 120/68  02/11/16 120/64  01/04/16 128/82    Wt Readings from Last 3 Encounters:  02/13/16 206 lb 12 oz (93.8 kg)  02/11/16 206 lb 6.4 oz (93.6 kg)  01/04/16 202 lb (91.6 kg)    General appearance: alert, cooperative and appears stated age Ears: normal TM's and external ear canals both ears Throat: lips, mucosa, and tongue normal; teeth and gums normal Neck: no adenopathy, no carotid bruit, supple, symmetrical, trachea midline and thyroid not enlarged, symmetric, no tenderness/mass/nodules Back: symmetric, no curvature. ROM normal. No CVA tenderness. Lungs: clear to auscultation bilaterally Heart: regular rate and rhythm, S1, S2 normal, no murmur, click, rub or gallop Abdomen: soft, non-tender; bowel sounds normal; no masses,  no organomegaly Pulses: 2+ and symmetric Skin: Skin color, texture, turgor normal. No rashes or lesions Lymph nodes: Cervical, supraclavicular, and axillary nodes normal.  Lab Results  Component Value Date   HGBA1C 5.4 07/28/2015    Lab Results  Component Value Date   CREATININE 0.83 01/04/2016   CREATININE 0.83 10/19/2015   CREATININE 0.78 10/12/2015    Lab Results  Component Value Date   WBC 6.8 01/04/2016   HGB 12.0 01/04/2016   HCT 36.9 01/04/2016   PLT 168.0 01/04/2016   GLUCOSE 82 01/04/2016   CHOL 173 03/14/2015   TRIG 85.0 03/14/2015   HDL 57.10 03/14/2015   LDLCALC 99 03/14/2015   ALT 13 01/04/2016   AST 16 01/04/2016   NA 139 01/04/2016   K 4.2 01/04/2016    CL 101 01/04/2016   CREATININE 0.83 01/04/2016   BUN 18 01/04/2016   CO2 31 01/04/2016   TSH 1.02 03/14/2015   INR 1.1 02/14/2013   HGBA1C 5.4 07/28/2015    No results found.  Assessment & Plan:   Problem List Items Addressed This Visit    Severe mitral regurgitation by prior echocardiogram    I am concerned that her mitral regurgitation may be playing a signficant role in her exercise intolerance. She has pulmonary hypertension, COPD  and obesity as well.       Pulmonary hypertension ([place])    Primary vs secondary from OSA and severe MR.  There appears to be no further workup offered by her pulmonologist, for unclear reasons,  But may be due to the risk of right sided cardiac cath  given her single kidney status.  COPD exacerbation ([place])    She is still wheezing on exam but no longer hypoxic  Currently on Doxycyline,  Chest x ray shows a possible right mid lung infiltrate.  Given her prolonged symptoms,  Will recommend CT chest non contrast.       Relevant Medications   methylPREDNISolone acetate (DEPO-MEDROL) injection 40 mg (Completed)   Other Relevant Orders   CT Chest Wo Contrast   Acute respiratory failure with hypoxia (HCC)    She has not been at baseline since the summer.  Has not seen Pulmonology for her most recent prolonged exacerbation.  Her cough and sinus congestion have persisted and  she remains dyspneic with exertion and has gained weight due to lack of activity.  Advised to folllow up with Pulmonology and ENT       Relevant Orders   CT Chest Wo Contrast   Chronic respiratory failure with hypoxia (HCC)    Multifactorial ,        Obesity (BMI 30-39.9)    She has gained 11 lbs this summer after losing 60 lbs,  because her dyspnea has prevented her from resuming exercise.  Recommended Silver Sneakers participation .        Other Visit Diagnoses    Moderate persistent asthma with acute exacerbation    -  Primary   Relevant Medications    methylPREDNISolone acetate (DEPO-MEDROL) injection 40 mg (Completed)   Other Relevant Orders   DG Chest 2 View (Completed)   CT Chest Wo Contrast      I am having Ms. Amacher maintain her umeclidinium-vilanterol, loratadine-pseudoephedrine, montelukast, levothyroxine, polyethylene glycol powder, albuterol, fluticasone, ipratropium-albuterol, fluticasone, losartan, hydrochlorothiazide, OXYGEN, traMADol, and doxycycline. We administered methylPREDNISolone acetate.  Meds ordered this encounter  Medications  . methylPREDNISolone acetate (DEPO-MEDROL) injection 40 mg    There are no discontinued medications.  Follow-up: No Follow-up on file.   [name], MD

## 2016-02-13 NOTE — Telephone Encounter (Signed)
Pt called and stated that we should have a received a form form [employer] for [place]. The form that we orignially faxed had the wrong information on it. The new form just needs to be filled out and faxed back. Thank you!  Fax # [phone]

## 2016-02-14 DIAGNOSIS — J4541 Moderate persistent asthma with (acute) exacerbation: Secondary | ICD-10-CM

## 2016-02-14 MED ORDER — METHYLPREDNISOLONE ACETATE 40 MG/ML IJ SUSP
40.0000 mg | Freq: Once | INTRAMUSCULAR | Status: AC
  Administered 2016-02-14: 40 mg via INTRAMUSCULAR

## 2016-02-15 NOTE — Telephone Encounter (Signed)
Pt requested a update on this form  Pt contact [phone]

## 2016-02-16 NOTE — Assessment & Plan Note (Signed)
She has not been at baseline since the summer.  Has not seen Pulmonology for her most recent prolonged exacerbation.  Her cough and sinus congestion have persisted and  she remains dyspneic with exertion and has gained weight due to lack of activity.  Advised to folllow up with Pulmonology and ENT

## 2016-02-16 NOTE — Assessment & Plan Note (Addendum)
Multifactorial ,

## 2016-02-16 NOTE — Assessment & Plan Note (Addendum)
Primary vs secondary from OSA and severe MR.  There appears to be no further workup offered by her pulmonologist, for unclear reasons,  But may be due to the risk of right sided cardiac cath  given her single kidney status.

## 2016-02-17 NOTE — Progress Notes (Signed)
  I have reviewed the above information and agree with above.   Raiana Pharris, MD 

## 2016-02-18 NOTE — Telephone Encounter (Signed)
Pt called about form being faxed over again today. Please fax back to [phone]. Thank you!

## 2016-02-24 ENCOUNTER — Ambulatory Visit
Admission: EM | Admit: 2016-02-24 | Discharge: 2016-02-24 | Disposition: A | Discharge status: Home / Self Care | Payer: Medicare Other | Attending: Emergency Medicine | Admitting: Emergency Medicine

## 2016-02-24 ENCOUNTER — Encounter: Payer: Self-pay | Admitting: Gynecology

## 2016-02-24 DIAGNOSIS — J441 Chronic obstructive pulmonary disease with (acute) exacerbation: Secondary | ICD-10-CM | POA: Diagnosis not present

## 2016-02-24 MED ORDER — IPRATROPIUM-ALBUTEROL 0.5-2.5 (3) MG/3ML IN SOLN
3.0000 mL | Freq: Once | RESPIRATORY_TRACT | Status: AC
  Administered 2016-02-24: 3 mL via RESPIRATORY_TRACT

## 2016-02-24 MED ORDER — METHYLPREDNISOLONE SODIUM SUCC 40 MG IJ SOLR
40.0000 mg | Freq: Once | INTRAMUSCULAR | Status: AC
  Administered 2016-02-24: 40 mg via INTRAMUSCULAR

## 2016-02-24 MED ORDER — METHYLPREDNISOLONE ACETATE 80 MG/ML IJ SUSP: 40.0000 mg | Freq: Once | INTRAMUSCULAR | Status: DC

## 2016-02-24 NOTE — ED Triage Notes (Signed)
Patient c/o seen by her pcp on 02/13/16 for same symptoms and was given prednisone . Per patient would like another dose of prednisone because she is not feeling any better. Per patient also had a chest xray done on 02/16/16 and is schedule for a CT Scan on Tuesday 02/26/2016.

## 2016-02-24 NOTE — ED Provider Notes (Signed)
CSN: [id number]     Arrival date & time 02/24/16  1243 History   First MD Initiated Contact with Patient 02/24/16 1521     Chief Complaint  Patient presents with  . URI   (Consider location/radiation/quality/duration/timing/severity/associated sxs/prior Treatment) HPI  This is a 60 year old female who has a history of COPD and pulmonary fibrosis  mitral regurgitation was seen by her PCP on 02/13/2016 and administered 40 mg of Depo-Medrol IM and has her scheduled for a CT of her chest on Tuesday. There is a question of a small area of infiltrate that they were assessing. Today she started having some shortness of breath and chest congestion and noticed that the effects of the Depo-Medrol were wearing off. Her oxygenation is 100% on room air. She is afebrile.      Past Medical History:  Diagnosis Date  . Anxiety   . Congenital absence of one kidney   . COPD (chronic obstructive pulmonary disease) ([place])   . Depression    treated at [facility]  . Emphysema/COPD ([place])   . History of sciatica   . Hypertension   . hypothyrodism   . Hypothyroidism   . Motion sickness    cars  . MVP (mitral valve prolapse)   . PONV (postoperative nausea and vomiting)   . Pulmonary hypertension   . Sleep apnea    O2 at night and PRN   Past Surgical History:  Procedure Laterality Date  . ABDOMINAL HYSTERECTOMY    . BREAST BIOPSY Right 2011   [facility]< benign  . CATARACT EXTRACTION W/PHACO Left 11/14/2015   Procedure: CATARACT EXTRACTION PHACO AND INTRAOCULAR LENS PLACEMENT (IOC);  Surgeon: Leandrew Koyanagi, MD;  Location: [facility];  Service: Ophthalmology;  Laterality: Left;  sleep apnea [place]  . CATARACT EXTRACTION W/PHACO Right 12/19/2015   Procedure: CATARACT EXTRACTION PHACO AND INTRAOCULAR LENS PLACEMENT (IOC);  Surgeon: Leandrew Koyanagi, MD;  Location: [place];  Service: Ophthalmology;  Laterality: Right;  ANXIETY GENEROUS IV SEDATION TORIC LEN  . COMBINED  HYSTERECTOMY ABDOMINAL W/ A&P REPAIR / OOPHORECTOMY  1996   benign tumor  . INNER EAR SURGERY     bilateral   Family History  Problem Relation Age of Onset  . Multiple sclerosis Mother   . Coronary artery disease Father   . Heart disease Brother   . Breast cancer Paternal Aunt    Social History  Substance Use Topics  . Smoking status: Never Smoker  . Smokeless tobacco: Never Used  . Alcohol use No   OB History    No data available     Review of Systems  Constitutional: Positive for activity change. Negative for appetite change, chills, fatigue and fever.  Respiratory: Positive for cough, shortness of breath and wheezing. Negative for stridor.   All other systems reviewed and are negative.   Allergies  Codeine; Penicillins; Zithromax [azithromycin]; and Prednisone  Home Medications   Prior to Admission medications   Medication Sig Start Date End Date Taking? Authorizing Provider  albuterol (PROVENTIL HFA;VENTOLIN HFA) 108 (90 Base) MCG/ACT inhaler Inhale 2 puffs into the lungs every 6 (six) hours as needed for wheezing or shortness of breath. Pt uses with SPACER.   Yes Historical Provider, MD  doxycycline (DORYX) 100 MG EC tablet Take 1 tablet (100 mg total) by mouth 2 (two) times daily. 01/02/16  Yes [name], MD  fluticasone (FLONASE) 50 MCG/ACT nasal spray Place 2 sprays into both nostrils daily.   Yes Historical Provider, MD  fluticasone (  FLOVENT HFA) 110 MCG/ACT inhaler Inhale 1 puff into the lungs 2 (two) times daily.   Yes Historical Provider, MD  hydrochlorothiazide (HYDRODIURIL) 25 MG tablet TAKE 1/2 TABLET(12.5 MG) BY MOUTH DAILY 10/22/15  Yes [name], MD  ipratropium-albuterol (DUONEB) 0.5-2.5 (3) MG/3ML SOLN Take 3 mLs by nebulization every 4 (four) hours as needed (for wheezing/shortness of breath).   Yes Historical Provider, MD  levothyroxine (SYNTHROID, LEVOTHROID) 112 MCG tablet Take 112 mcg by mouth daily before breakfast.   Yes Historical Provider,  MD  loratadine-pseudoephedrine (CLARITIN-D 24-HOUR) 10-240 MG 24 hr tablet Take 1 tablet by mouth daily.   Yes Historical Provider, MD  losartan (COZAAR) 100 MG tablet TAKE 1 TABLET(100 MG) BY MOUTH DAILY 09/27/15  Yes [name], MD  montelukast (SINGULAIR) 10 MG tablet Take 1 tablet (10 mg total) by mouth at bedtime. 06/05/15  Yes Fritzi Mandes, MD  OXYGEN Inhale 2 L into the lungs at bedtime as needed (at bedtime and as needed).   Yes Historical Provider, MD  polyethylene glycol powder (GLYCOLAX/MIRALAX) powder Take 17 g by mouth 2 (two) times daily as needed. 08/03/15  Yes [name], MD  traMADol (ULTRAM) 50 MG tablet 1-2 tabs po q 6 hr prn pain Maximum dose= 8 tablets per day 11/09/15  Yes Melynda Ripple, MD  Umeclidinium-Vilanterol [facility] ELLIPTA) 62.5-25 MCG/INH AEPB Inhale 1 puff into the lungs daily.   Yes Historical Provider, MD   Meds Ordered and Administered this Visit   Medications  ipratropium-albuterol (DUONEB) 0.5-2.5 (3) MG/3ML nebulizer solution 3 mL (3 mLs Nebulization Given 02/24/16 1540)  methylPREDNISolone sodium succinate (SOLU-MEDROL) 40 mg/mL injection 40 mg (40 mg Intramuscular Given 02/24/16 1552)    BP 129/61 (BP Location: Left Arm)   Pulse 78   Temp 97.8 F (36.6 C) (Oral)   Resp 16   Ht 5' (1.524 m)   Wt 206 lb (93.4 kg)   SpO2 100%   BMI 40.23 kg/m  No data found.   Physical Exam  Constitutional: She is oriented to person, place, and time. She appears well-developed and well-nourished. No distress.  HENT:  Head: Normocephalic and atraumatic.  Eyes: EOM are normal. Pupils are equal, round, and reactive to light.  Neck: Normal range of motion. Neck supple.  Pulmonary/Chest: Effort normal. No respiratory distress. She has wheezes. She has rales.  Musculoskeletal: Normal range of motion.  Patient is using a Cane for ambulation  Neurological: She is alert and oriented to person, place, and time.  Skin: Skin is warm and dry. She is not diaphoretic.   Psychiatric: She has a normal mood and affect. Her behavior is normal. Judgment and thought content normal.  Nursing note and vitals reviewed.   Urgent Care Course   Clinical Course     Procedures (including critical care time)  Labs Review Labs Reviewed - No data to display  Imaging Review No results found.   Visual Acuity Review  Right Eye Distance:   Left Eye Distance:   Bilateral Distance:    Right Eye Near:   Left Eye Near:    Bilateral Near:    Medications  ipratropium-albuterol (DUONEB) 0.5-2.5 (3) MG/3ML nebulizer solution 3 mL (3 mLs Nebulization Given 02/24/16 1540)  methylPREDNISolone sodium succinate (SOLU-MEDROL) 40 mg/mL injection 40 mg (40 mg Intramuscular Given 02/24/16 1552)    Patient felt much improved and her breath sounds were much improved following the above treatment   MDM   1. COPD with acute exacerbation ([place])  Patient return to her previous medications at home. I asked her to contact Dr.Tullo tomorrow by 0 today's visit and the treatment that she received. She has further worsening this evening to go to the emergency room.     Lorin Picket, PA-C 02/24/16 [place] Manfred Laspina, PA-C 02/24/16 [phone]

## 2016-02-25 ENCOUNTER — Telehealth: Payer: Self-pay | Admitting: *Deleted

## 2016-02-25 DIAGNOSIS — J9611 Chronic respiratory failure with hypoxia: Secondary | ICD-10-CM

## 2016-02-25 DIAGNOSIS — R0902 Hypoxemia: Secondary | ICD-10-CM

## 2016-02-25 DIAGNOSIS — J449 Chronic obstructive pulmonary disease, unspecified: Secondary | ICD-10-CM

## 2016-02-25 DIAGNOSIS — I272 Pulmonary hypertension, unspecified: Secondary | ICD-10-CM

## 2016-02-25 NOTE — Telephone Encounter (Signed)
FYI patient was seen at [facility], She started Prednisone, and was given a breathing treatment. She also has a CT scan scheduled for 02/26/16 at 1330.

## 2016-02-25 NOTE — Telephone Encounter (Signed)
wpuld you like for her to have a follow up ?

## 2016-02-25 NOTE — Telephone Encounter (Signed)
What I'd like is for her to have a pulmonology evaluation for persistent symptoms .  Would she mind if I referred her to one of our pulmonologists?  I just don't think Dr Raul Del is taking her situation very seriously

## 2016-02-26 ENCOUNTER — Emergency Department: Payer: Medicare Other

## 2016-02-26 ENCOUNTER — Emergency Department
Admission: EM | Admit: 2016-02-26 | Discharge: 2016-02-27 | Disposition: A | Discharge status: Home / Self Care | Payer: Medicare Other | Attending: Emergency Medicine | Admitting: Emergency Medicine

## 2016-02-26 ENCOUNTER — Ambulatory Visit
Admission: RE | Admit: 2016-02-26 | Discharge: 2016-02-26 | Disposition: A | Discharge status: Home / Self Care | Payer: Medicare Other | Source: Ambulatory Visit | Attending: Internal Medicine | Admitting: Internal Medicine

## 2016-02-26 ENCOUNTER — Encounter: Payer: Self-pay | Admitting: *Deleted

## 2016-02-26 DIAGNOSIS — J4541 Moderate persistent asthma with (acute) exacerbation: Secondary | ICD-10-CM

## 2016-02-26 DIAGNOSIS — J9601 Acute respiratory failure with hypoxia: Secondary | ICD-10-CM

## 2016-02-26 DIAGNOSIS — E039 Hypothyroidism, unspecified: Secondary | ICD-10-CM | POA: Insufficient documentation

## 2016-02-26 DIAGNOSIS — J441 Chronic obstructive pulmonary disease with (acute) exacerbation: Secondary | ICD-10-CM | POA: Insufficient documentation

## 2016-02-26 DIAGNOSIS — R079 Chest pain, unspecified: Secondary | ICD-10-CM | POA: Diagnosis not present

## 2016-02-26 DIAGNOSIS — Z79899 Other long term (current) drug therapy: Secondary | ICD-10-CM | POA: Insufficient documentation

## 2016-02-26 DIAGNOSIS — I10 Essential (primary) hypertension: Secondary | ICD-10-CM | POA: Diagnosis not present

## 2016-02-26 DIAGNOSIS — R0602 Shortness of breath: Secondary | ICD-10-CM | POA: Diagnosis not present

## 2016-02-26 DIAGNOSIS — R918 Other nonspecific abnormal finding of lung field: Secondary | ICD-10-CM

## 2016-02-26 DIAGNOSIS — J398 Other specified diseases of upper respiratory tract: Secondary | ICD-10-CM | POA: Insufficient documentation

## 2016-02-26 DIAGNOSIS — I517 Cardiomegaly: Secondary | ICD-10-CM | POA: Insufficient documentation

## 2016-02-26 LAB — BASIC METABOLIC PANEL
Anion gap: 8 (ref 5–15)
BUN: 24 mg/dL — AB (ref 6–20)
CHLORIDE: 105 mmol/L (ref 101–111)
CO2: 27 mmol/L (ref 22–32)
CREATININE: 0.87 mg/dL (ref 0.44–1.00)
Calcium: 8.4 mg/dL — ABNORMAL LOW (ref 8.9–10.3)
GFR calc Af Amer: >60 mL/min (ref >60)
GFR calc non Af Amer: >60 mL/min (ref >60)
GLUCOSE: 99 mg/dL (ref 65–99)
Potassium: 4.7 mmol/L (ref 3.5–5.1)
Sodium: 140 mmol/L (ref 135–145)

## 2016-02-26 LAB — CBC
HCT: 36.7 % (ref 35.0–47.0)
Hemoglobin: 12.2 g/dL (ref 12.0–16.0)
MCH: 30 pg (ref 26.0–34.0)
MCHC: 33.3 g/dL (ref 32.0–36.0)
MCV: 90 fL (ref 80.0–100.0)
PLATELETS: 170 10*3/uL (ref 150–440)
RBC: 4.07 MIL/uL (ref 3.80–5.20)
RDW: 14.9 % — AB (ref 11.5–14.5)
WBC: 8.2 10*3/uL (ref 3.6–11.0)

## 2016-02-26 LAB — TROPONIN I: Troponin I: <0.03 ng/mL (ref <0.03)

## 2016-02-26 NOTE — Telephone Encounter (Signed)
Patient notified

## 2016-02-26 NOTE — Telephone Encounter (Signed)
Your referral is in process as requested. Our referral coordinator will call you when the appointment has been made.  

## 2016-02-26 NOTE — ED Triage Notes (Signed)
Pt brought in via ems from home with chest pain.  Pt states pain in center of chest.  Intermittent sob.  No n/v/d. Pt alert.

## 2016-02-26 NOTE — Telephone Encounter (Addendum)
Patient notified of {CP request and patient agrees to see Pulmonology  as long as she can stay in [place] area. Advised patient we have pulmonology available at [facility] .

## 2016-02-26 NOTE — ED Notes (Signed)
Pt has chest pain since this morning.  Intermittent sob.  States coughing white phlegm 2 days ago.  No fever.  Pt seen in urgent care 2 days ago and was given a steroid shot.  Pt states she isn't feeling much better .  Pt alert.  Iv started.   Labs sent.

## 2016-02-27 ENCOUNTER — Telehealth: Payer: Self-pay | Admitting: Internal Medicine

## 2016-02-27 DIAGNOSIS — J441 Chronic obstructive pulmonary disease with (acute) exacerbation: Secondary | ICD-10-CM | POA: Diagnosis not present

## 2016-02-27 DIAGNOSIS — R079 Chest pain, unspecified: Secondary | ICD-10-CM | POA: Diagnosis not present

## 2016-02-27 LAB — RAPID INFLUENZA A&B ANTIGENS (ARMC ONLY)
INFLUENZA A (ARMC): NEGATIVE
INFLUENZA B (ARMC): NEGATIVE

## 2016-02-27 MED ORDER — METHYLPREDNISOLONE SODIUM SUCC 125 MG IJ SOLR
125.0000 mg | Freq: Once | INTRAMUSCULAR | Status: AC
Start: 2016-02-27 — End: 2016-02-27
  Administered 2016-02-27: 125 mg via INTRAVENOUS
  Filled 2016-02-27: qty 2

## 2016-02-27 MED ORDER — DOXYCYCLINE HYCLATE 50 MG PO CAPS: 100.0000 mg | ORAL_CAPSULE | Freq: Two times a day (BID) | ORAL | 0 refills | Status: DC

## 2016-02-27 NOTE — Telephone Encounter (Signed)
1 pm this friday

## 2016-02-27 NOTE — Telephone Encounter (Signed)
Pt is scheduled for 12/8 @ 4:30

## 2016-02-27 NOTE — Telephone Encounter (Signed)
Only appt available are the 630 on Monday night, and then on Friday next week a 1130 and 430, Please advise which is a better one?

## 2016-02-27 NOTE — ED Provider Notes (Signed)
[facility] Emergency Department Provider Note   First MD Initiated Contact with Patient 02/26/16 2343     (approximate)  I have reviewed the triage vital signs and the nursing notes.   HISTORY  Chief Complaint Chest Pain    HPI Lauren Bartlett is a 60 y.o. female with bolus of chronic medical conditions presents to the emergency department with "I'm just really congested". Patient states that she's had a productive cough 2 days patient states that she was seen by her primary care provider Dr. Derrel Nip who gave her a "steroid shot". Chest x-ray revealed no gross abnormality that was performed by Dr. Myriam Jacobson and a such a CT scan was ordered which was performed yesterday which was also negative. Patient denies any chest pain no fever afebrile on presentation with a temperature of 98.7.   Past Medical History:  Diagnosis Date  . Anxiety   . Congenital absence of one kidney   . COPD (chronic obstructive pulmonary disease) ([place])   . Depression    treated at [facility]  . Emphysema/COPD ([place])   . History of sciatica   . Hypertension   . hypothyrodism   . Hypothyroidism   . Motion sickness    cars  . MVP (mitral valve prolapse)   . PONV (postoperative nausea and vomiting)   . Pulmonary hypertension   . Sleep apnea    O2 at night and PRN    Patient Active Problem List   Diagnosis Date Noted  . Constipation 08/05/2015  . Hospital discharge follow-up 08/05/2015  . Thrombocytopenia ([place]) 08/05/2015  . Acute respiratory failure with hypoxia ([place])   . Chronic respiratory failure with hypoxia ([place])   . Ileus ([place])   . Encounter for preventive health examination 03/14/2015  . Breast cancer screening 03/14/2015  . Need for hepatitis C screening test 03/14/2015  . COPD with hypoxia ([place]) 02/16/2015  . COPD exacerbation ([place]) 02/03/2015  . Vitamin D deficiency 01/11/2015  . S/P hysterectomy with oophorectomy 09/22/2013  . Pulmonary hypertension ([place])  07/14/2013  . Hypothyroidism 05/04/2012  . Severe mitral regurgitation by prior echocardiogram 01/11/2012  . Obesity (BMI 30-39.9) 08/04/2011  . Depression   . Hypertension   . Congenital absence of one kidney   . History of sciatica     Past Surgical History:  Procedure Laterality Date  . ABDOMINAL HYSTERECTOMY    . BREAST BIOPSY Right 2011   [facility]< benign  . CATARACT EXTRACTION W/PHACO Left 11/14/2015   Procedure: CATARACT EXTRACTION PHACO AND INTRAOCULAR LENS PLACEMENT (IOC);  Surgeon: Leandrew Koyanagi, MD;  Location: [place];  Service: Ophthalmology;  Laterality: Left;  sleep apnea Toric  . CATARACT EXTRACTION W/PHACO Right 12/19/2015   Procedure: CATARACT EXTRACTION PHACO AND INTRAOCULAR LENS PLACEMENT (IOC);  Surgeon: Leandrew Koyanagi, MD;  Location: [place];  Service: Ophthalmology;  Laterality: Right;  ANXIETY GENEROUS IV SEDATION TORIC LEN  . COMBINED HYSTERECTOMY ABDOMINAL W/ A&P REPAIR / OOPHORECTOMY  1996   benign tumor  . INNER EAR SURGERY     bilateral    Prior to Admission medications   Medication Sig Start Date End Date Taking? Authorizing Provider  albuterol (PROVENTIL HFA;VENTOLIN HFA) 108 (90 Base) MCG/ACT inhaler Inhale 2 puffs into the lungs every 6 (six) hours as needed for wheezing or shortness of breath. Pt uses with SPACER.    Historical Provider, MD  doxycycline (DORYX) 100 MG EC tablet Take 1 tablet (100 mg total) by mouth 2 (two) times daily. 01/02/16  [name], MD  fluticasone (FLONASE) 50 MCG/ACT nasal spray Place 2 sprays into both nostrils daily.    Historical Provider, MD  fluticasone (FLOVENT HFA) 110 MCG/ACT inhaler Inhale 1 puff into the lungs 2 (two) times daily.    Historical Provider, MD  hydrochlorothiazide (HYDRODIURIL) 25 MG tablet TAKE 1/2 TABLET(12.5 MG) BY MOUTH DAILY 10/22/15   [name], MD  ipratropium-albuterol (DUONEB) 0.5-2.5 (3) MG/3ML SOLN Take 3 mLs by nebulization every 4 (four) hours as needed  (for wheezing/shortness of breath).    Historical Provider, MD  levothyroxine (SYNTHROID, LEVOTHROID) 112 MCG tablet Take 112 mcg by mouth daily before breakfast.    Historical Provider, MD  loratadine-pseudoephedrine (CLARITIN-D 24-HOUR) 10-240 MG 24 hr tablet Take 1 tablet by mouth daily.    Historical Provider, MD  losartan (COZAAR) 100 MG tablet TAKE 1 TABLET(100 MG) BY MOUTH DAILY 09/27/15   [name], MD  montelukast (SINGULAIR) 10 MG tablet Take 1 tablet (10 mg total) by mouth at bedtime. 06/05/15   Fritzi Mandes, MD  OXYGEN Inhale 2 L into the lungs at bedtime as needed (at bedtime and as needed).    Historical Provider, MD  polyethylene glycol powder (GLYCOLAX/MIRALAX) powder Take 17 g by mouth 2 (two) times daily as needed. 08/03/15   [name], MD  traMADol (ULTRAM) 50 MG tablet 1-2 tabs po q 6 hr prn pain Maximum dose= 8 tablets per day 11/09/15   Melynda Ripple, MD  Umeclidinium-Vilanterol [facility] ELLIPTA) 62.5-25 MCG/INH AEPB Inhale 1 puff into the lungs daily.    Historical Provider, MD    Allergies Codeine; Penicillins; Zithromax [azithromycin]; and Prednisone  Family History  Problem Relation Age of Onset  . Multiple sclerosis Mother   . Coronary artery disease Father   . Heart disease Brother   . Breast cancer Paternal Aunt     Social History Social History  Substance Use Topics  . Smoking status: Never Smoker  . Smokeless tobacco: Never Used  . Alcohol use No    Review of Systems Constitutional: No fever/chills Eyes: No visual changes. ENT: No sore throat. Cardiovascular: Denies chest pain. Respiratory: Denies shortness of breath.Positive for Cough Gastrointestinal: No abdominal pain.  No nausea, no vomiting.  No diarrhea.  No constipation. Genitourinary: Negative for dysuria. Musculoskeletal: Negative for back pain. Skin: Negative for rash. Neurological: Negative for headaches, focal weakness or numbness.  10-point ROS otherwise  negative.  ____________________________________________   PHYSICAL EXAM:  VITAL SIGNS: ED Triage Vitals  Enc Vitals Group     BP 02/26/16 2308 116/73     Pulse Rate 02/26/16 2308 80     Resp 02/26/16 2308 20     Temp 02/26/16 2308 98.7 F (37.1 C)     Temp Source 02/26/16 2308 Oral     SpO2 02/26/16 2308 97 %     Weight 02/26/16 2309 208 lb (94.3 kg)     Height 02/26/16 2309 5\' 2"  (1.575 m)     Head Circumference --      [place] Flow --      Pain Score 02/26/16 2309 8     Pain Loc --      Pain Edu? --      Excl. in [place]? --     Constitutional: Alert and oriented. Well appearing and in no acute distress. Eyes: Conjunctivae are normal. PERRL. EOMI. Head: Atraumatic. Ears:  Healthy appearing ear canals and TMs bilaterally Nose: No congestion/rhinnorhea. Mouth/Throat: Mucous membranes are moist.  Oropharynx non-erythematous.  Neck: No stridor.  No meningeal signs. Cardiovascular: Normal rate, regular rhythm. Good peripheral circulation. Grossly normal heart sounds. Respiratory: Normal respiratory effort.  No retractions. Lungs CTAB. Gastrointestinal: Soft and nontender. No distention.  Musculoskeletal: No lower extremity tenderness nor edema. No gross deformities of extremities. Neurologic:  Normal speech and language. No gross focal neurologic deficits are appreciated.  Skin:  Skin is warm, dry and intact. No rash noted. Psychiatric: Mood and affect are normal. Speech and behavior are normal.  ____________________________________________   LABS (all labs ordered are listed, but only abnormal results are displayed)  Labs Reviewed  BASIC METABOLIC PANEL - Abnormal; Notable for the following:       Result Value   BUN 24 (*)    Calcium 8.4 (*)    All other components within normal limits  CBC - Abnormal; Notable for the following:    RDW 14.9 (*)    All other components within normal limits  RAPID INFLUENZA A&B ANTIGENS ([facility] ONLY)  TROPONIN I     RADIOLOGY I,  Porter N Jacksen Isip, personally viewed and evaluated these images (plain radiographs) as part of my medical decision making, as well as reviewing the written report by the radiologist.  Dg Chest 2 View  Result Date: 02/27/2016 CLINICAL DATA:  Acute onset of central chest pain and shortness of breath. Initial encounter. EXAM: CHEST  2 VIEW COMPARISON:  Chest radiograph performed 02/13/2016, and CT of the chest performed 02/26/2016 FINDINGS: The lungs are well-aerated. Minimal left basilar atelectasis is noted. There is no evidence of pleural effusion or pneumothorax. The heart is mildly enlarged. No acute osseous abnormalities are seen. IMPRESSION: Minimal left basilar atelectasis noted.  Mild cardiomegaly. Electronically Signed   By: Garald Balding M.D.   On: 02/27/2016 00:29   Ct Chest Wo Contrast  Result Date: 02/26/2016 CLINICAL DATA:  Shortness of breath, productive cough and wheezing for 6 weeks. EXAM: CT CHEST WITHOUT CONTRAST TECHNIQUE: Multidetector CT imaging of the chest was performed following the standard protocol without IV contrast. COMPARISON:  Chest x-ray 02/13/2016.  CT 07/29/2015. FINDINGS: Comment: There is significant respiratory and patient motion throughout the study which limits diagnostic sensitivity of this study. Cardiovascular: Heart is mildly enlarged.  Aorta is normal caliber. Mediastinum/Nodes: No mediastinal, hilar, or axillary adenopathy. Again noted is significant compression of the trachea and mainstem bronchi bilaterally tearing expiration compatible with tracheobronchomalacia. Lungs/Pleura: Linear areas of atelectasis or scarring in the lung bases, similar prior study. No other confluent opacities or pleural effusions. Upper Abdomen: Imaging into the upper abdomen shows no acute findings. Musculoskeletal: Chest wall soft tissues are unremarkable. No acute bony abnormality or focal bone lesion. IMPRESSION: Respiratory and patient motion somewhat limits diagnostic  sensitivity. Despite this, there is no visible acute cardiopulmonary abnormality. Stable changes of tracheobronchomalacia. Cardiomegaly. Bibasilar atelectasis or scarring. Electronically Signed   By: Rolm Baptise M.D.   On: 02/26/2016 14:26      Procedures    INITIAL IMPRESSION / ASSESSMENT AND PLAN / ED COURSE  Pertinent labs & imaging results that were available during my care of the patient were reviewed by me and considered in my medical decision making (see chart for details).     Clinical Course    Patient given Solu-Medrol and 25 mg will be prescribed doxycycline secondary to penicillin and azithromycin allergy. ____________________________________________  FINAL CLINICAL IMPRESSION(S) / ED DIAGNOSES  Final diagnoses:  Chronic obstructive pulmonary disease with acute exacerbation ([place])     MEDICATIONS GIVEN DURING THIS VISIT:  Medications - No data to display   NEW OUTPATIENT MEDICATIONS STARTED DURING THIS VISIT:  New Prescriptions   No medications on file    Modified Medications   No medications on file    Discontinued Medications   No medications on file     Note:  This document was prepared using Dragon voice recognition software and may include unintentional dictation errors.    Gregor Hams, MD 02/27/16 [phone]

## 2016-02-27 NOTE — Telephone Encounter (Signed)
The next available is December 8th, at 430.

## 2016-02-27 NOTE — ED Notes (Signed)
Report off to david rn 

## 2016-02-27 NOTE — Telephone Encounter (Signed)
Noted, thanks!

## 2016-02-27 NOTE — Telephone Encounter (Signed)
Pt cannot do 12/1 at 1. Is there another date and time available?

## 2016-02-27 NOTE — Telephone Encounter (Signed)
Pt called and stated that she was in the ED last night for a COPD aspiration. She needs a follow up appt with Dr. Derrel Nip. Please advise as to where to schedule. Thank you!  Call pt @ [phone]

## 2016-02-28 ENCOUNTER — Ambulatory Visit (INDEPENDENT_AMBULATORY_CARE_PROVIDER_SITE_OTHER): Payer: Medicare Other | Admitting: Internal Medicine

## 2016-02-28 ENCOUNTER — Encounter: Payer: Self-pay | Admitting: Internal Medicine

## 2016-02-28 VITALS — BP 114/60 | HR 76 | Temp 97.5°F | Resp 12 | Ht 60.0 in | Wt 208.2 lb

## 2016-02-28 DIAGNOSIS — J398 Other specified diseases of upper respiratory tract: Secondary | ICD-10-CM

## 2016-02-28 DIAGNOSIS — G4733 Obstructive sleep apnea (adult) (pediatric): Secondary | ICD-10-CM | POA: Diagnosis not present

## 2016-02-28 DIAGNOSIS — J9611 Chronic respiratory failure with hypoxia: Secondary | ICD-10-CM | POA: Diagnosis not present

## 2016-02-28 NOTE — Progress Notes (Signed)
Subjective:  Patient ID: Lauren Bartlett, female    DOB: 1955/06/27  Age: 60 y.o. MRN: [id number]  CC: The primary encounter diagnosis was Tracheobronchomalacia. Diagnoses of OSA (obstructive sleep apnea) and Chronic respiratory failure with hypoxia ([place]) were also pertinent to this visit.  HPI Lauren Bartlett presents for yet another ER follow up.  Patient has had persistent symptoms of dyspnea and wheezing for the past 3 months and has been receiving multiple treatments for COPD exacerbation .  Review of pulmonology notes from Dr Raul Del are notably void of any mention of tracheomalacia, but CT done earlier this week notes tracheomalacia which was also reported on the April 30 CT that was done during hospitalization but never discussed with patient by pulmonology. Lauren Bartlett  PFTs were done in Dec 2016 at [facility]  And are reported as "moderate-severe obstruction" . Patient also has history of OSA by sleep study  One or two years ago (no study on file)  but was never given CPAP.Lauren Bartlett  Wears supplemental 02 by [place] at night 2 L/min.  She does not sleep well,  She is afraid of going to sleep and never waking up. Has had this fear since childhood. Tearful today.   She has multiple family members,  Including a nephew who she is designating as a POA,  4 brothers, and a father who all  live in [place] .  Still not having any assistance at home despite request for PCS  From the Dept of Health and Human Services in June  [phone] fax [phone]       Outpatient Medications Prior to Visit  Medication Sig Dispense Refill  . albuterol (PROVENTIL HFA;VENTOLIN HFA) 108 (90 Base) MCG/ACT inhaler Inhale 2 puffs into the lungs every 6 (six) hours as needed for wheezing or shortness of breath. Pt uses with SPACER.    Lauren Bartlett doxycycline (VIBRAMYCIN) 50 MG capsule Take 2 capsules (100 mg total) by mouth 2 (two) times daily. 20 capsule 0  . fluticasone (FLONASE) 50 MCG/ACT nasal spray Place 2 sprays into both nostrils  daily.    . fluticasone (FLOVENT HFA) 110 MCG/ACT inhaler Inhale 1 puff into the lungs 2 (two) times daily.    . hydrochlorothiazide (HYDRODIURIL) 25 MG tablet TAKE 1/2 TABLET(12.5 MG) BY MOUTH DAILY 45 tablet 1  . ipratropium-albuterol (DUONEB) 0.5-2.5 (3) MG/3ML SOLN Take 3 mLs by nebulization every 4 (four) hours as needed (for wheezing/shortness of breath).    Lauren Bartlett levothyroxine (SYNTHROID, LEVOTHROID) 112 MCG tablet Take 112 mcg by mouth daily before breakfast.    . loratadine-pseudoephedrine (CLARITIN-D 24-HOUR) 10-240 MG 24 hr tablet Take 1 tablet by mouth daily.    Lauren Bartlett losartan (COZAAR) 100 MG tablet TAKE 1 TABLET(100 MG) BY MOUTH DAILY 30 tablet 5  . montelukast (SINGULAIR) 10 MG tablet Take 1 tablet (10 mg total) by mouth at bedtime. 30 tablet 3  . OXYGEN Inhale 2 L into the lungs at bedtime as needed (at bedtime and as needed).    . polyethylene glycol powder (GLYCOLAX/MIRALAX) powder Take 17 g by mouth 2 (two) times daily as needed. 3350 g 1  . traMADol (ULTRAM) 50 MG tablet 1-2 tabs po q 6 hr prn pain Maximum dose= 8 tablets per day 20 tablet 0  . Umeclidinium-Vilanterol (ANORO ELLIPTA) 62.5-25 MCG/INH AEPB Inhale 1 puff into the lungs daily.    Lauren Bartlett doxycycline (DORYX) 100 MG EC tablet Take 1 tablet (100 mg total) by mouth 2 (two) times daily. (Patient not  taking: Reported on 02/28/2016) 14 tablet 0   No facility-administered medications prior to visit.     Review of Systems;  Patient denies headache, fevers, malaise, unintentional weight loss, skin rash, eye pain, sinus congestion and sinus pain, sore throat, dysphagia,  hemoptysis , cough, dyspnea, wheezing, chest pain, palpitations, orthopnea, edema, abdominal pain, nausea, melena, diarrhea, constipation, flank pain, dysuria, hematuria, urinary  Frequency, nocturia, numbness, tingling, seizures,  Focal weakness, Loss of consciousness,  Tremor, insomnia, depression, anxiety, and suicidal ideation.      Objective:  BP 114/60   Pulse  76   Temp 97.5 F (36.4 C) (Oral)   Resp 12   Ht 5' (1.524 m)   Wt 208 lb 4 oz (94.5 kg)   SpO2 99%   BMI 40.67 kg/m   BP Readings from Last 3 Encounters:  02/28/16 114/60  02/27/16 129/64  02/24/16 129/61    Wt Readings from Last 3 Encounters:  02/28/16 208 lb 4 oz (94.5 kg)  02/26/16 208 lb (94.3 kg)  02/24/16 206 lb (93.4 kg)    General appearance: alert, cooperative and appears stated age Ears: normal TM's and external ear canals both ears Throat: lips, mucosa, and tongue normal; teeth and gums normal Neck: no adenopathy, no carotid bruit, supple, symmetrical, trachea midline and thyroid not enlarged, symmetric, no tenderness/mass/nodules Back: symmetric, no curvature. ROM normal. No CVA tenderness. Lungs: clear to auscultation bilaterally Heart: regular rate and rhythm, S1, S2 normal, no murmur, click, rub or gallop Abdomen: soft, non-tender; bowel sounds normal; no masses,  no organomegaly Pulses: 2+ and symmetric Skin: Skin color, texture, turgor normal. No rashes or lesions Lymph nodes: Cervical, supraclavicular, and axillary nodes normal.  Lab Results  Component Value Date   HGBA1C 5.4 07/28/2015    Lab Results  Component Value Date   CREATININE 0.87 02/26/2016   CREATININE 0.83 01/04/2016   CREATININE 0.83 10/19/2015    Lab Results  Component Value Date   WBC 8.2 02/26/2016   HGB 12.2 02/26/2016   HCT 36.7 02/26/2016   PLT 170 02/26/2016   GLUCOSE 99 02/26/2016   CHOL 173 03/14/2015   TRIG 85.0 03/14/2015   HDL 57.10 03/14/2015   LDLCALC 99 03/14/2015   ALT 13 01/04/2016   AST 16 01/04/2016   NA 140 02/26/2016   K 4.7 02/26/2016   CL 105 02/26/2016   CREATININE 0.87 02/26/2016   BUN 24 (H) 02/26/2016   CO2 27 02/26/2016   TSH 1.02 03/14/2015   INR 1.1 02/14/2013   HGBA1C 5.4 07/28/2015    Dg Chest 2 View  Result Date: 02/27/2016 CLINICAL DATA:  Acute onset of central chest pain and shortness of breath. Initial encounter. EXAM: CHEST   2 VIEW COMPARISON:  Chest radiograph performed 02/13/2016, and CT of the chest performed 02/26/2016 FINDINGS: The lungs are well-aerated. Minimal left basilar atelectasis is noted. There is no evidence of pleural effusion or pneumothorax. The heart is mildly enlarged. No acute osseous abnormalities are seen. IMPRESSION: Minimal left basilar atelectasis noted.  Mild cardiomegaly. Electronically Signed   By: Garald Balding M.D.   On: 02/27/2016 00:29   Ct Chest Wo Contrast  Result Date: 02/26/2016 CLINICAL DATA:  Shortness of breath, productive cough and wheezing for 6 weeks. EXAM: CT CHEST WITHOUT CONTRAST TECHNIQUE: Multidetector CT imaging of the chest was performed following the standard protocol without IV contrast. COMPARISON:  Chest x-ray 02/13/2016.  CT 07/29/2015. FINDINGS: Comment: There is significant respiratory and patient motion throughout the study which limits  diagnostic sensitivity of this study. Cardiovascular: Heart is mildly enlarged.  Aorta is normal caliber. Mediastinum/Nodes: No mediastinal, hilar, or axillary adenopathy. Again noted is significant compression of the trachea and mainstem bronchi bilaterally tearing expiration compatible with tracheobronchomalacia. Lungs/Pleura: Linear areas of atelectasis or scarring in the lung bases, similar prior study. No other confluent opacities or pleural effusions. Upper Abdomen: Imaging into the upper abdomen shows no acute findings. Musculoskeletal: Chest wall soft tissues are unremarkable. No acute bony abnormality or focal bone lesion. IMPRESSION: Respiratory and patient motion somewhat limits diagnostic sensitivity. Despite this, there is no visible acute cardiopulmonary abnormality. Stable changes of tracheobronchomalacia. Cardiomegaly. Bibasilar atelectasis or scarring. Electronically Signed   By: Rolm Baptise M.D.   On: 02/26/2016 14:26    Assessment & Plan:   Problem List Items Addressed This Visit    Chronic respiratory failure with  hypoxia ([place])    Her condition has been attributed to COPD based on PFTs but  2 CTa in the past year suggest tracheobronchomalacia as a significant factor.  She has been symptomatic for several months with no improvement except transient relief of wheezing after steroid injections.  Referral to dr simonds for second opinion        Other Visit Diagnoses    Tracheobronchomalacia    -  Primary   Relevant Orders   Ambulatory referral to Sleep Studies   Pulmonary function test   OSA (obstructive sleep apnea)       Relevant Orders   Ambulatory referral to Sleep Studies    A total of 25 minutes of face to face time was spent with patient more than half of which was spent in counselling about the above mentioned conditions  and coordination of care    I am having Ms. Worster maintain her umeclidinium-vilanterol, loratadine-pseudoephedrine, montelukast, levothyroxine, polyethylene glycol powder, albuterol, fluticasone, ipratropium-albuterol, fluticasone, losartan, hydrochlorothiazide, OXYGEN, traMADol, doxycycline, and doxycycline.  No orders of the defined types were placed in this encounter.   There are no discontinued medications.  Follow-up: No Follow-up on file.   [name], MD

## 2016-02-28 NOTE — Progress Notes (Signed)
Pre-visit discussion using our clinic review tool. No additional management support is needed unless otherwise documented below in the visit note.  

## 2016-02-28 NOTE — Patient Instructions (Addendum)
Your persistent breathing problems may be due to a condition called tracheobronchomalacia.  This condition may have been present since you were a child and causes your airways to collapse so you can't get enough air  This does not get better with medications for COPD.   Dr Alva Garnet will discuss this more with you when you see him  Continue to use oxygen and your albuterol.   We may end up having to repeat your pulmonary function tests and your sleep study because CPAP will help

## 2016-02-29 ENCOUNTER — Encounter: Payer: Self-pay | Admitting: Internal Medicine

## 2016-02-29 DIAGNOSIS — J398 Other specified diseases of upper respiratory tract: Secondary | ICD-10-CM

## 2016-02-29 HISTORY — DX: Other specified diseases of upper respiratory tract: J39.8

## 2016-03-02 NOTE — Assessment & Plan Note (Signed)
Her condition has been attributed to COPD based on PFTs but  2 CTa in the past year suggest tracheobronchomalacia as a significant factor.  She has been symptomatic for several months with no improvement except transient relief of wheezing after steroid injections.  Referral to dr simonds for second opinion

## 2016-03-03 ENCOUNTER — Telehealth: Payer: Self-pay | Admitting: Internal Medicine

## 2016-03-03 NOTE — Telephone Encounter (Signed)
Pt called to verify if a form for home health aid was faxed over on Friday from [facility]. Please advise? Thank you!  Call pt @ [phone].

## 2016-03-04 DIAGNOSIS — J449 Chronic obstructive pulmonary disease, unspecified: Secondary | ICD-10-CM | POA: Diagnosis not present

## 2016-03-04 NOTE — Telephone Encounter (Signed)
Pt requested an update on this foam  Pt contact [phone]

## 2016-03-05 NOTE — Telephone Encounter (Signed)
Left patient detailed message form faxed 03/03/16

## 2016-03-05 NOTE — Telephone Encounter (Signed)
Pt wanted to make sure form got faxed to  [phone].[name] Please advise pt went sent

## 2016-03-06 NOTE — Telephone Encounter (Signed)
Notified patient form was faxed on 03/03/16 to [facility] and another faxed on 03/06/16 just to ensure received.

## 2016-03-07 ENCOUNTER — Encounter: Payer: Self-pay | Admitting: Pulmonary Disease

## 2016-03-07 ENCOUNTER — Ambulatory Visit (INDEPENDENT_AMBULATORY_CARE_PROVIDER_SITE_OTHER): Payer: Medicare Other | Admitting: Pulmonary Disease

## 2016-03-07 ENCOUNTER — Ambulatory Visit: Payer: Medicare Other | Admitting: Internal Medicine

## 2016-03-07 VITALS — BP 134/84 | HR 65 | Wt 209.0 lb

## 2016-03-07 DIAGNOSIS — R0609 Other forms of dyspnea: Secondary | ICD-10-CM | POA: Diagnosis not present

## 2016-03-07 DIAGNOSIS — J398 Other specified diseases of upper respiratory tract: Secondary | ICD-10-CM

## 2016-03-07 DIAGNOSIS — I340 Nonrheumatic mitral (valve) insufficiency: Secondary | ICD-10-CM

## 2016-03-07 DIAGNOSIS — I272 Pulmonary hypertension, unspecified: Secondary | ICD-10-CM | POA: Diagnosis not present

## 2016-03-07 DIAGNOSIS — G4733 Obstructive sleep apnea (adult) (pediatric): Secondary | ICD-10-CM

## 2016-03-07 DIAGNOSIS — J449 Chronic obstructive pulmonary disease, unspecified: Secondary | ICD-10-CM

## 2016-03-07 DIAGNOSIS — R5381 Other malaise: Secondary | ICD-10-CM

## 2016-03-07 DIAGNOSIS — I341 Nonrheumatic mitral (valve) prolapse: Secondary | ICD-10-CM

## 2016-03-07 NOTE — Patient Instructions (Signed)
1) Continue current medical regimen  2) I agree with repeating the sleep study as ordered by Dr Derrel Nip 3) It is possible that you do indeed have tracheobronchomalacia but this diagnosis is not absolutely confirmed and there are no real treatments for it if we were to confirm it 4) Dr Derrel Nip might consider repeating an echocardiogram as I think the mitral valve prolapse and mitral regurgitation are probably significant contributors to your shortness of breath 5) We will see you back her in 3 months or as needed

## 2016-03-12 ENCOUNTER — Telehealth: Payer: Self-pay | Admitting: *Deleted

## 2016-03-12 DIAGNOSIS — I340 Nonrheumatic mitral (valve) insufficiency: Secondary | ICD-10-CM

## 2016-03-12 DIAGNOSIS — I272 Pulmonary hypertension, unspecified: Secondary | ICD-10-CM

## 2016-03-12 NOTE — Telephone Encounter (Signed)
Pt was advised by Dr. Naaman Plummer to have echocardiogram,and was referred to Dr Clayborn Bigness. Patient questioned if she should  see a cardiologist reffered by Dr. Festus Aloe so she requested [name].  Pt contact [phone]

## 2016-03-12 NOTE — Progress Notes (Signed)
PULMONARY CONSULT NOTE  Requesting MD/Service: Derrel Nip  Date of initial consultation: 03/07/16  Reason for consultation: tracheobronchomalacia  PT PROFILE: 60 y.o. F never smoker with significant lifetime exposure to second hand smoke with multiple medical problems including chronic hypoxic respiratory failure, moderate COPD, mitral valve prolapse and regurgitation, pulm htn by echocardiogram, appearance of tracheomalacia on CT chest previously followed by Dr Raul Del and referred for further evaluation of severe exertional limitation   DATA: Echocardiogram 02/01/13: Normal LVEF. MVP with moderate MR, Moderate AI, severe TI. RVSP est 91 mmHg CT chest 07/29/15: Cardiomegaly with left atrial dilatation. Tracheobronchomalacia. Air trapping in the lungs bilaterally, indicative of small airways disease Echocardiogram 06/13/14: Normal LVEF, Severe MR, RVSP est 62 mmHg Spirometry 03/12/15: FVC 1.94 L (73% pred). FEV1 1.19 L (55% pred), FEV1/FVC 61% 6MWT 03/12/15: 225 meters, no desaturation LE venous US 07/29/15: No DVT Echocardiogram 07/31/15: LVEF 60-65%. MV poorly visualized, trivial MR, LA size normal, TV poorly visualized, RVSP not estimated CT chest 03/11/16: Stable changes of tracheobronchomalacia  HPI:  As above. She is a somewhat difficult historian and it took some effort to discern why she has been referred to me. She has been extensively evaluated and closely followed by Dr Raul Del and is also followed by [facility]. She reports class III dyspnea @ baseline. She has a history of OSA and was intolerant to CPAP attempts. She was started on O2 in 2015 and wears it with sleep and "as needed" during the daytime. She has a history of "aspiration attacks". It has not been explained why she has suffered recurrent aspiration. She has no significant occupational or environmental exposures. She has been on disability for many years.   Past Medical History:  Diagnosis Date  . Anxiety    . Congenital absence of one kidney   . COPD (chronic obstructive pulmonary disease) ([place])   . Depression    treated at [facility]  . Emphysema/COPD ([place])   . History of sciatica   . Hypertension   . hypothyrodism   . Hypothyroidism   . Motion sickness    cars  . MVP (mitral valve prolapse)   . PONV (postoperative nausea and vomiting)   . Pulmonary hypertension   . Sleep apnea    O2 at night and PRN    Past Surgical History:  Procedure Laterality Date  . ABDOMINAL HYSTERECTOMY    . BREAST BIOPSY Right 2011   [facility]< benign  . CATARACT EXTRACTION W/PHACO Left 11/14/2015   Procedure: CATARACT EXTRACTION PHACO AND INTRAOCULAR LENS PLACEMENT (IOC);  Surgeon: Leandrew Koyanagi, MD;  Location: [place];  Service: Ophthalmology;  Laterality: Left;  sleep apnea [place]  . CATARACT EXTRACTION W/PHACO Right 12/19/2015   Procedure: CATARACT EXTRACTION PHACO AND INTRAOCULAR LENS PLACEMENT (IOC);  Surgeon: Leandrew Koyanagi, MD;  Location: [place];  Service: Ophthalmology;  Laterality: Right;  ANXIETY GENEROUS IV SEDATION TORIC LEN  . COMBINED HYSTERECTOMY ABDOMINAL W/ A&P REPAIR / OOPHORECTOMY  1996   benign tumor  . INNER EAR SURGERY     bilateral    MEDICATIONS: I have reviewed all medications and confirmed regimen as documented  Social History   Social History  . Marital status: Single    Spouse name: N/A  . Number of children: N/A  . Years of education: N/A   Occupational History  . cna     part time   Social History Main Topics  . Smoking status: Never Smoker  . Smokeless tobacco: Never Used  .  Alcohol use No  . Drug use: No  . Sexual activity: Not Currently   Other Topics Concern  . Not on file   Social History Narrative   Lives in a group home for adults          Family History  Problem Relation Age of Onset  . Multiple sclerosis Mother   . Coronary artery disease Father   . Heart disease Brother   . Breast cancer Paternal Aunt      ROS: No fever, myalgias/arthralgias, unexplained weight loss or weight gain No new focal weakness or sensory deficits No otalgia, hearing loss, visual changes, nasal and sinus symptoms, mouth and throat problems No neck pain or adenopathy No abdominal pain, N/V/D, diarrhea, change in bowel pattern No dysuria, change in urinary pattern   Vitals:   03/07/16 1030  BP: 134/84  Pulse: 65  SpO2: 97%  Weight: 94.8 kg (209 lb)     EXAM:  Gen: Frail-appearing, No overt respiratory distress HEENT: NCAT, sclera white, oropharynx normal Neck: Supple without LAN, thyromegaly. JVP is not well visualized @ 90 degrees Lungs: BS full, slightly coarse with a few scattered wheezes, percussion normal Cardiovascular: Reg, III-IV/VI holosystolic M heard across precordium Abdomen: Soft, nontender, normal BS Ext: without clubbing, cyanosis, edema Neuro: CNs grossly intact, motor and sensory intact Skin: Limited exam, no lesions noted  DATA:   BMP Latest Ref Rng & Units 02/26/2016 01/04/2016 10/19/2015  Glucose 65 - 99 mg/dL 99 82 67(L)  BUN 6 - 20 mg/dL 24(H) 18 18  Creatinine 0.44 - 1.00 mg/dL 0.87 0.83 0.83  Sodium 135 - 145 mmol/L 140 139 140  Potassium 3.5 - 5.1 mmol/L 4.7 4.2 4.1  Chloride 101 - 111 mmol/L 105 101 101  CO2 22 - 32 mmol/L 27 31 32  Calcium 8.9 - 10.3 mg/dL 8.4(L) 8.9 8.8    CBC Latest Ref Rng & Units 02/26/2016 01/04/2016 10/12/2015  WBC 3.6 - 11.0 K/uL 8.2 6.8 6.3  Hemoglobin 12.0 - 16.0 g/dL 12.2 12.0 12.5  Hematocrit 35.0 - 47.0 % 36.7 36.9 38.8  Platelets 150 - 440 K/uL 170 168.0 166.0    CXR (02/26/16): NACPD    IMPRESSION:     ICD-9-CM ICD-10-CM   1. DOE (dyspnea on exertion) 786.09 R06.09   2. Pulmonary hypertension 416.8 I27.20   3. OSA (obstructive sleep apnea) 327.23 G47.33   4. COPD, moderate ([place]) 496 J44.9   5. Tracheobronchomalacia 519.19 J39.8   6. Mitral valve prolapse 424.0 I34.1   7. Severe mitral regurgitation 424.0 I34.0   8. Physical  deconditioning 799.3 R53.81    Extreme exertional limitation/dyspnea is multifactorial. It is out of proportion to her "COPD" and I suspect her valvular heart disease and profound deconditioning are major factors. The pulmonary hypertension is lgroup V (ie. Multifactorial due to chronic lung disease and valvular heart disease). Therefore, pulmonary vasodilator therapy is not indicated.   With regard to the suspected tracheobronchomalacia, this is a difficult diagnosis to make with certainty by CT scanning. It certainly could be a contributor to her obstructive lung disease. The etiology, if it is in fact present, is unclear but could be due to recurrent aspiration which then raises the issue of why she has had repeated aspiration events.  Unfortunately, there are probably no feasible treatment options for tracheobronchomalacia for her.   It is troubling that the echocardiograms are not very consistent with each other, especially the most recent one. Her exam is certainly suggestive of more than "  trivial" mitral regurgitation. It is probable that the mitral valve prolapse and regurgitation are significant contributors to her overall debilitation as well as her pulmonary hypertension  PLAN:  1) Continue current medical regimen of Anoro, Flovent and PRN albuterol 2) Continue nocturnal oxygen. I agree with repeating the sleep study as ordered by Dr Derrel Nip. If there is significant nocturnal desaturation, we should probably retry CPAP 3) Dr Derrel Nip might consider repeating an echocardiogram - see discussion above 4) ROV here in 3 months and we will repeat Spirometry @ that time   Merton Border, MD PCCM service Mobile [phone] Pager [phone] 03/12/2016

## 2016-03-13 ENCOUNTER — Ambulatory Visit (INDEPENDENT_AMBULATORY_CARE_PROVIDER_SITE_OTHER): Payer: Medicare Other | Admitting: Family

## 2016-03-13 ENCOUNTER — Encounter: Payer: Self-pay | Admitting: Family

## 2016-03-13 ENCOUNTER — Ambulatory Visit (INDEPENDENT_AMBULATORY_CARE_PROVIDER_SITE_OTHER): Payer: Medicare Other

## 2016-03-13 VITALS — BP 116/60 | HR 81 | Temp 97.4°F | Ht 60.0 in | Wt 210.4 lb

## 2016-03-13 DIAGNOSIS — J441 Chronic obstructive pulmonary disease with (acute) exacerbation: Secondary | ICD-10-CM

## 2016-03-13 DIAGNOSIS — J449 Chronic obstructive pulmonary disease, unspecified: Secondary | ICD-10-CM | POA: Diagnosis not present

## 2016-03-13 MED ORDER — DOXYCYCLINE HYCLATE 100 MG PO TABS: 100.0000 mg | ORAL_TABLET | Freq: Two times a day (BID) | ORAL | 0 refills | Status: DC

## 2016-03-13 MED ORDER — METHYLPREDNISOLONE ACETATE 80 MG/ML IJ SUSP
80.0000 mg | Freq: Once | INTRAMUSCULAR | Status: AC
  Administered 2016-03-13: 80 mg via INTRAMUSCULAR

## 2016-03-13 MED ORDER — METHYLPREDNISOLONE ACETATE 40 MG/ML INJ SUSP (RADIOLOG: 80.0000 mg | Freq: Once | INTRAMUSCULAR | Status: DC

## 2016-03-13 MED ORDER — IPRATROPIUM-ALBUTEROL 0.5-2.5 (3) MG/3ML IN SOLN
3.0000 mL | Freq: Once | RESPIRATORY_TRACT | Status: AC
  Administered 2016-03-13: 3 mL via RESPIRATORY_TRACT

## 2016-03-13 NOTE — Telephone Encounter (Signed)
Yes, I will order an ECHO to be read by Dr Fletcher Anon.

## 2016-03-13 NOTE — Progress Notes (Signed)
Pre visit review using our clinic review tool, if applicable. No additional management support is needed unless otherwise documented below in the visit note. 

## 2016-03-13 NOTE — Telephone Encounter (Signed)
Patient notified and voiced understanding.

## 2016-03-13 NOTE — Patient Instructions (Signed)
Let me know if not better Follow up one week.

## 2016-03-13 NOTE — Progress Notes (Signed)
Subjective:    Patient ID: Blodwyn Elsayed, female    DOB: 08-07-1955, 60 y.o.   MRN: [id number]  CC: Dalissa Leisinger is a 60 y.o. female who presents today for an acute visit.    HPI: CC: productive clear-green cough, congestion x 2 days, worsening. Wearing oxygen today. Endorses wheezing, SOB, nasal congestion. Can 'walk like normally do' and have to stop and 'rest a minute.'  No fever, sinus pressure, ear pain. Denies exertional chest pain or pressure, numbness or tingling radiating to left arm or jaw, palpitations, dizziness, frequent headaches, changes in vision. Using inhalers as prescribed with some relief. Recent round of doxycycline 2 months ago with complete improvement  H/o COPD, valvular heart disease, pulmonary HTN   O2 in 2015 and wears it with sleep and "as needed" during the daytime.  Saw Dr. Alva Garnet 01/2016. Reviewed note. 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 No opacities. Lung scarring HISTORY:  Past Medical History:  Diagnosis Date  . Anxiety   . Congenital absence of one kidney   . COPD (chronic obstructive pulmonary disease) ([place])   . Depression    treated at [place]  . Emphysema/COPD ([place])   . History of sciatica   . Hypertension   . hypothyrodism   . Hypothyroidism   . Motion sickness    cars  . MVP (mitral valve prolapse)   . PONV (postoperative nausea and vomiting)   . Pulmonary hypertension   . Sleep apnea    O2 at night and PRN   Past Surgical History:  Procedure Laterality Date  . ABDOMINAL HYSTERECTOMY    . BREAST BIOPSY Right 2011   [facility]< benign  . CATARACT EXTRACTION W/PHACO Left 11/14/2015   Procedure: CATARACT EXTRACTION PHACO AND INTRAOCULAR LENS PLACEMENT (IOC);  Surgeon: Leandrew Koyanagi, MD;  Location: [facility];  Service: Ophthalmology;  Laterality: Left;  sleep apnea [place]  . CATARACT EXTRACTION W/PHACO Right 12/19/2015   Procedure: CATARACT EXTRACTION PHACO AND INTRAOCULAR LENS  PLACEMENT (IOC);  Surgeon: Leandrew Koyanagi, MD;  Location: [facility];  Service: Ophthalmology;  Laterality: Right;  ANXIETY GENEROUS IV SEDATION TORIC LEN  . COMBINED HYSTERECTOMY ABDOMINAL W/ A&P REPAIR / OOPHORECTOMY  1996   benign tumor  . INNER EAR SURGERY     bilateral   Family History  Problem Relation Age of Onset  . Multiple sclerosis Mother   . Coronary artery disease Father   . Heart disease Brother   . Breast cancer Paternal Aunt     Allergies: Codeine; Penicillins; Zithromax [azithromycin]; and Prednisone Current Outpatient Prescriptions on File Prior to Visit  Medication Sig Dispense Refill  . albuterol (PROVENTIL HFA;VENTOLIN HFA) 108 (90 Base) MCG/ACT inhaler Inhale 2 puffs into the lungs every 6 (six) hours as needed for wheezing or shortness of breath. Pt uses with SPACER.    [name] albuterol (PROVENTIL) (2.5 MG/3ML) 0.083% nebulizer solution Take 2.5 mg by nebulization every 6 (six) hours as needed for wheezing or shortness of breath.    . doxycycline (VIBRAMYCIN) 50 MG capsule Take 2 capsules (100 mg total) by mouth 2 (two) times daily. 20 capsule 0  . fluticasone (FLONASE) 50 MCG/ACT nasal spray Place 2 sprays into both nostrils daily.    . fluticasone (FLOVENT HFA) 110 MCG/ACT inhaler Inhale 1 puff into the lungs 2 (two) times daily.    . hydrochlorothiazide (HYDRODIURIL) 25 MG tablet TAKE 1/2 TABLET(12.5 MG) BY MOUTH DAILY 45 tablet 1  . levothyroxine (SYNTHROID,  LEVOTHROID) 112 MCG tablet Take 112 mcg by mouth daily before breakfast.    . loratadine-pseudoephedrine (CLARITIN-D 24-HOUR) 10-240 MG 24 hr tablet Take 1 tablet by mouth daily.    [name] losartan (COZAAR) 100 MG tablet TAKE 1 TABLET(100 MG) BY MOUTH DAILY 30 tablet 5  . montelukast (SINGULAIR) 10 MG tablet Take 1 tablet (10 mg total) by mouth at bedtime. 30 tablet 3  . OXYGEN Inhale 2 L into the lungs at bedtime as needed (at bedtime and as needed).    . polyethylene glycol powder (GLYCOLAX/MIRALAX)  powder Take 17 g by mouth 2 (two) times daily as needed. 3350 g 1  . traMADol (ULTRAM) 50 MG tablet 1-2 tabs po q 6 hr prn pain Maximum dose= 8 tablets per day 20 tablet 0  . Umeclidinium-Vilanterol (ANORO ELLIPTA) 62.5-25 MCG/INH AEPB Inhale 1 puff into the lungs daily.     No current facility-administered medications on file prior to visit.     Social History  Substance Use Topics  . Smoking status: Never Smoker  . Smokeless tobacco: Never Used  . Alcohol use No    Review of Systems  Constitutional: Negative for chills and fever.  Respiratory: Positive for cough.   Cardiovascular: Negative for chest pain and palpitations.  Gastrointestinal: Negative for nausea and vomiting.      Objective:    BP 116/60   Pulse 81   Temp 97.4 F (36.3 C) (Oral)   Ht 5' (1.524 m)   Wt 210 lb 6.4 oz (95.4 kg)   SpO2 97%   BMI 41.09 kg/m    Physical Exam  Constitutional:  Frail appearing  HENT:  Head: Normocephalic and atraumatic.  Right Ear: Hearing, tympanic membrane, external ear and ear canal normal. No drainage, swelling or tenderness. No foreign bodies. Tympanic membrane is not erythematous and not bulging. No middle ear effusion. No decreased hearing is noted.  Left Ear: Hearing, tympanic membrane, external ear and ear canal normal. No drainage, swelling or tenderness. No foreign bodies. Tympanic membrane is not erythematous and not bulging.  No middle ear effusion. No decreased hearing is noted.  Nose: Nose normal. No rhinorrhea. Right sinus exhibits no maxillary sinus tenderness and no frontal sinus tenderness. Left sinus exhibits no maxillary sinus tenderness and no frontal sinus tenderness.  Mouth/Throat: Uvula is midline, oropharynx is clear and moist and mucous membranes are normal. No oropharyngeal exudate, posterior oropharyngeal edema, posterior oropharyngeal erythema or tonsillar abscesses.  Eyes: Conjunctivae are normal.  Cardiovascular: Normal rate, regular rhythm, normal  heart sounds and normal pulses.   Pulmonary/Chest: Effort normal. She has decreased breath sounds in the right lower field and the left lower field. She has no wheezes. She has no rhonchi. She has no rales.  Lymphadenopathy:       Head (right side): No submental, no submandibular, no tonsillar, no preauricular, no posterior auricular and no occipital adenopathy present.       Head (left side): No submental, no submandibular, no tonsillar, no preauricular, no posterior auricular and no occipital adenopathy present.    She has no cervical adenopathy.  Neurological: She is alert.  Skin: Skin is warm and dry.  Psychiatric: She has a normal mood and affect. Her speech is normal and behavior is normal. Thought content normal.  Vitals reviewed. Patient felt significantly better after albuterol treatment. Lung sounds clear and increased      Assessment & Plan:   Problem List Items Addressed This Visit      Respiratory  COPD exacerbation (HCC) - Primary    Afebrile. No acute respiratory distress. SaO2 97%. Patient improved on nebulizer treatment. IM Depo-Medrol given. Patient has improved and doxycycline with each round of medication. We'll do one week doxycycline. F/u one week.       Relevant Medications   ipratropium-albuterol (DUONEB) 0.5-2.5 (3) MG/3ML nebulizer solution 3 mL (Completed)   methylPREDNISolone acetate (DEPO-MEDROL) injection (RADIOLOGY ONLY) 80 mg   methylPREDNISolone acetate (DEPO-MEDROL) injection 80 mg (Completed)   doxycycline (VIBRA-TABS) 100 MG tablet   Other Relevant Orders   DG Chest 2 View (Completed)        I am having Ms. Bohnet maintain her umeclidinium-vilanterol, loratadine-pseudoephedrine, montelukast, levothyroxine, polyethylene glycol powder, albuterol, fluticasone, fluticasone, losartan, hydrochlorothiazide, OXYGEN, traMADol, doxycycline, and albuterol.   No orders of the defined types were placed in this encounter.   Return precautions given.    Risks, benefits, and alternatives of the medications and treatment plan prescribed today were discussed, and patient expressed understanding.   Education regarding symptom management and diagnosis given to patient on AVS.  Continue to follow with TULLO, Aris Everts, MD for routine health maintenance.   [name] and I agreed with plan.   Mable Paris, FNP

## 2016-03-13 NOTE — Assessment & Plan Note (Signed)
Afebrile. No acute respiratory distress. SaO2 97%. Patient improved on nebulizer treatment. IM Depo-Medrol given. Patient has improved and doxycycline with each round of medication. We'll do one week doxycycline. F/u one week.

## 2016-03-20 ENCOUNTER — Encounter: Payer: Self-pay | Admitting: Family

## 2016-03-20 ENCOUNTER — Ambulatory Visit (INDEPENDENT_AMBULATORY_CARE_PROVIDER_SITE_OTHER): Payer: Medicare Other | Admitting: Family

## 2016-03-20 DIAGNOSIS — J449 Chronic obstructive pulmonary disease, unspecified: Secondary | ICD-10-CM

## 2016-03-20 DIAGNOSIS — R0902 Hypoxemia: Secondary | ICD-10-CM | POA: Diagnosis not present

## 2016-03-20 NOTE — Progress Notes (Signed)
Pre visit review using our clinic review tool, if applicable. No additional management support is needed unless otherwise documented below in the visit note. 

## 2016-03-20 NOTE — Assessment & Plan Note (Signed)
Pleased with patient's improvement. SAo2 100 on 2L. No adventitious lung sounds. Provided reassurance that congestion should continue to improve with adequate hydration, vicks vapor rub, humidifier. Patient has f/u with PCP 04/11/16; advised to return sooner if doesn't continue to improve.

## 2016-03-20 NOTE — Progress Notes (Signed)
Subjective:    Patient ID: Lauren Bartlett, female    DOB: [birth year], 60 y.o.   MRN: [id number]  CC: Lauren Bartlett is a 60 y.o. female who presents today for follow up.   HPI: Follow up: Feeling better. Congestion improving. 'a little wheezy sometimes at night.' Has one more doxyccyline. No fever, chills, SOB.  Unable to take mucinex.   Wearing 02 at 2L just when 'coming to doctor' also wears it 'everynight.'   Using nebulizer 3 x per day which helps break up mucous.   Has seasonal allergies. Doesn't drink much water.      HISTORY:  Past Medical History:  Diagnosis Date  . Anxiety   . Congenital absence of one kidney   . COPD (chronic obstructive pulmonary disease) ([place])   . Depression    treated at [place]  . Emphysema/COPD ([place])   . History of sciatica   . Hypertension   . hypothyrodism   . Hypothyroidism   . Motion sickness    cars  . MVP (mitral valve prolapse)   . PONV (postoperative nausea and vomiting)   . Pulmonary hypertension   . Sleep apnea    O2 at night and PRN   Past Surgical History:  Procedure Laterality Date  . ABDOMINAL HYSTERECTOMY    . BREAST BIOPSY Right 2011   [facility]< benign  . CATARACT EXTRACTION W/PHACO Left 11/14/2015   Procedure: CATARACT EXTRACTION PHACO AND INTRAOCULAR LENS PLACEMENT (IOC);  Surgeon: Leandrew Koyanagi, MD;  Location: [facility];  Service: Ophthalmology;  Laterality: Left;  sleep apnea Toric  . CATARACT EXTRACTION W/PHACO Right 12/19/2015   Procedure: CATARACT EXTRACTION PHACO AND INTRAOCULAR LENS PLACEMENT (IOC);  Surgeon: Leandrew Koyanagi, MD;  Location: [facility];  Service: Ophthalmology;  Laterality: Right;  ANXIETY GENEROUS IV SEDATION TORIC LEN  . COMBINED HYSTERECTOMY ABDOMINAL W/ A&P REPAIR / OOPHORECTOMY  1996   benign tumor  . INNER EAR SURGERY     bilateral   Family History  Problem Relation Age of Onset  . Multiple sclerosis Mother   . Coronary artery disease Father   . Heart disease  Brother   . Breast cancer Paternal Aunt     Allergies: Codeine; Penicillins; Zithromax [azithromycin]; and Prednisone Current Outpatient Prescriptions on File Prior to Visit  Medication Sig Dispense Refill  . albuterol (PROVENTIL HFA;VENTOLIN HFA) 108 (90 Base) MCG/ACT inhaler Inhale 2 puffs into the lungs every 6 (six) hours as needed for wheezing or shortness of breath. Pt uses with SPACER.    [name] albuterol (PROVENTIL) (2.5 MG/3ML) 0.083% nebulizer solution Take 2.5 mg by nebulization every 6 (six) hours as needed for wheezing or shortness of breath.    . doxycycline (VIBRA-TABS) 100 MG tablet Take 1 tablet (100 mg total) by mouth 2 (two) times daily. 14 tablet 0  . fluticasone (FLONASE) 50 MCG/ACT nasal spray Place 2 sprays into both nostrils daily.    . fluticasone (FLOVENT HFA) 110 MCG/ACT inhaler Inhale 1 puff into the lungs 2 (two) times daily.    . hydrochlorothiazide (HYDRODIURIL) 25 MG tablet TAKE 1/2 TABLET(12.5 MG) BY MOUTH DAILY 45 tablet 1  . levothyroxine (SYNTHROID, LEVOTHROID) 112 MCG tablet Take 112 mcg by mouth daily before breakfast.    . loratadine-pseudoephedrine (CLARITIN-D 24-HOUR) 10-240 MG 24 hr tablet Take 1 tablet by mouth daily.    [name] losartan (COZAAR) 100 MG tablet TAKE 1 TABLET(100 MG) BY MOUTH DAILY 30 tablet 5  . montelukast (SINGULAIR) 10 MG tablet Take 1 tablet (  10 mg total) by mouth at bedtime. 30 tablet 3  . OXYGEN Inhale 2 L into the lungs at bedtime as needed (at bedtime and as needed).    . polyethylene glycol powder (GLYCOLAX/MIRALAX) powder Take 17 g by mouth 2 (two) times daily as needed. 3350 g 1  . traMADol (ULTRAM) 50 MG tablet 1-2 tabs po q 6 hr prn pain Maximum dose= 8 tablets per day 20 tablet 0  . Umeclidinium-Vilanterol (ANORO ELLIPTA) 62.5-25 MCG/INH AEPB Inhale 1 puff into the lungs daily.     Current Facility-Administered Medications on File Prior to Visit  Medication Dose Route Frequency Provider Last Rate Last Dose  . methylPREDNISolone  acetate (DEPO-MEDROL) injection (RADIOLOGY ONLY) 80 mg  80 mg Intramuscular Once Burnard Hawthorne, FNP        Social History  Substance Use Topics  . Smoking status: Never Smoker  . Smokeless tobacco: Never Used  . Alcohol use No    Review of Systems  Constitutional: Negative for chills and fever.  HENT: Positive for congestion. Negative for ear pain, sinus pressure and sore throat.   Respiratory: Positive for wheezing. Negative for cough and shortness of breath.   Cardiovascular: Negative for chest pain, palpitations and leg swelling.  Gastrointestinal: Negative for nausea and vomiting.      Objective:    BP 112/62   Pulse 79   Temp 97.4 F (36.3 C) (Oral)   Ht 5' (1.524 m)   Wt 209 lb 13.9 oz (95.2 kg)   SpO2 100%   BMI 40.99 kg/m  BP Readings from Last 3 Encounters:  03/20/16 112/62  03/13/16 116/60  03/07/16 134/84   Wt Readings from Last 3 Encounters:  03/20/16 209 lb 13.9 oz (95.2 kg)  03/13/16 210 lb 6.4 oz (95.4 kg)  03/07/16 209 lb (94.8 kg)    Physical Exam  Constitutional: She appears well-developed and well-nourished.  Eyes: Conjunctivae are normal.  Cardiovascular: Normal rate, regular rhythm, normal heart sounds and normal pulses.   Pulmonary/Chest: Effort normal and breath sounds normal. She has no wheezes. She has no rhonchi. She has no rales.  Neurological: She is alert.  Skin: Skin is warm and dry.  Psychiatric: She has a normal mood and affect. Her speech is normal and behavior is normal. Thought content normal.  Vitals reviewed.      Assessment & Plan:   Problem List Items Addressed This Visit      Respiratory   COPD with hypoxia ([place])    Pleased with patient's improvement. SAo2 100 on 2L. No adventitious lung sounds. Provided reassurance that congestion should continue to improve with adequate hydration, vicks vapor rub, humidifier. Patient has f/u with PCP 04/11/16; advised to return sooner if doesn't continue to improve.           I am having Ms. Coombs maintain her umeclidinium-vilanterol, loratadine-pseudoephedrine, montelukast, levothyroxine, polyethylene glycol powder, albuterol, fluticasone, fluticasone, losartan, hydrochlorothiazide, OXYGEN, traMADol, albuterol, and doxycycline. We will continue to administer methylPREDNISolone acetate.   No orders of the defined types were placed in this encounter.   Return precautions given.   Risks, benefits, and alternatives of the medications and treatment plan prescribed today were discussed, and patient expressed understanding.   Education regarding symptom management and diagnosis given to patient on AVS.  Continue to follow with TULLO, Aris Everts, MD for routine health maintenance.   [name] and I agreed with plan.   Mable Paris, FNP

## 2016-03-20 NOTE — Patient Instructions (Addendum)
You look great today.   Please me know if you don't continue to improve.     Increase intake of clear fluids. Congestion is best treated by hydration, when mucus is wetter, it is thinner, less sticky, and easier to expel from the body, either through coughing up drainage, or by blowing your nose.   Get plenty of rest.   Use saline nasal drops and blow your nose frequently. Run a humidifier at night and elevate the head of the bed. Vicks Vapor rub will help with congestion and cough. Steam showers and sinus massage for congestion.    You can also try a teaspoon of honey to see if this will help reduce cough. Throat lozenges can sometimes be beneficial as well.    This illness will typically last 7 - 10 days.   Please follow up with our clinic if you develop a fever greater than 101 F, symptoms worsen, or do not resolve in the next week.

## 2016-03-28 DIAGNOSIS — J449 Chronic obstructive pulmonary disease, unspecified: Secondary | ICD-10-CM | POA: Diagnosis not present

## 2016-04-01 ENCOUNTER — Other Ambulatory Visit: Payer: Self-pay | Admitting: Internal Medicine

## 2016-04-03 ENCOUNTER — Other Ambulatory Visit: Payer: Self-pay | Admitting: Internal Medicine

## 2016-04-03 DIAGNOSIS — J398 Other specified diseases of upper respiratory tract: Secondary | ICD-10-CM

## 2016-04-04 ENCOUNTER — Ambulatory Visit: Payer: Medicare Other | Admitting: Internal Medicine

## 2016-04-07 DIAGNOSIS — J449 Chronic obstructive pulmonary disease, unspecified: Secondary | ICD-10-CM | POA: Diagnosis not present

## 2016-04-11 ENCOUNTER — Encounter: Payer: Self-pay | Admitting: Internal Medicine

## 2016-04-11 ENCOUNTER — Ambulatory Visit (INDEPENDENT_AMBULATORY_CARE_PROVIDER_SITE_OTHER): Payer: Medicare Other | Admitting: Internal Medicine

## 2016-04-11 DIAGNOSIS — J0100 Acute maxillary sinusitis, unspecified: Secondary | ICD-10-CM | POA: Diagnosis not present

## 2016-04-11 DIAGNOSIS — I340 Nonrheumatic mitral (valve) insufficiency: Secondary | ICD-10-CM

## 2016-04-11 DIAGNOSIS — J9611 Chronic respiratory failure with hypoxia: Secondary | ICD-10-CM | POA: Diagnosis not present

## 2016-04-11 DIAGNOSIS — I272 Pulmonary hypertension, unspecified: Secondary | ICD-10-CM

## 2016-04-11 MED ORDER — DOXYCYCLINE HYCLATE 100 MG PO TABS: 100.0000 mg | ORAL_TABLET | Freq: Two times a day (BID) | ORAL | 0 refills | Status: DC

## 2016-04-11 NOTE — Progress Notes (Signed)
Subjective:  Patient ID: Lauren Bartlett, female    DOB: [birth year]  Age: 61 y.o. MRN: [id number]  CC: Diagnoses of Chronic respiratory failure with hypoxia ([place]), Pulmonary hypertension ([place]), and Severe mitral regurgitation by prior echocardiogram were pertinent to this visit.  HPI Lauren Bartlett presents for follow up on respiratory illness.  Last seen Dec 21 by Joycelyn Schmid for resolving COPD exacerbation. Was was treated with doxycycline , IM steroids and inhaled steroid/bronchodilator  Therapy .  Her symptoms had largely resolved,  But she reports that she started coughing up green sputum yesterday as well as having sinus drainage that has been purulent and blood streaked for the past 2 days.  Has seen Dr Alva Garnet, .  Who has suggested repeating the ECHO to assess her "trivial " mitral regurgitation"  And agrees with repeating sleep study  To look for treatable conditions.  He did not offer definitive evaluation or treatment for tracheomalacia    ECHO is scheduled for Tuesday,  Sleep study on the 30th.    Walked 2 laps around the apt complex yesterday  With her walker,  About 1/4 mile, was not short of breath.  Needs  New walker,  Brakes not working.    Wants to resume PT at  with [facility] in [place]   No prior swallow eval   Frontal sinus tenderness     Outpatient Medications Prior to Visit  Medication Sig Dispense Refill  . albuterol (PROVENTIL HFA;VENTOLIN HFA) 108 (90 Base) MCG/ACT inhaler Inhale 2 puffs into the lungs every 6 (six) hours as needed for wheezing or shortness of breath. Pt uses with SPACER.    [name] albuterol (PROVENTIL) (2.5 MG/3ML) 0.083% nebulizer solution Take 2.5 mg by nebulization every 6 (six) hours as needed for wheezing or shortness of breath.    . fluticasone (FLONASE) 50 MCG/ACT nasal spray Place 2 sprays into both nostrils daily.    . fluticasone (FLOVENT HFA) 110 MCG/ACT inhaler Inhale 1 puff into the lungs 2 (two) times daily.    .  hydrochlorothiazide (HYDRODIURIL) 25 MG tablet TAKE 1/2 TABLET(12.5 MG) BY MOUTH DAILY 45 tablet 1  . levothyroxine (SYNTHROID, LEVOTHROID) 112 MCG tablet Take 112 mcg by mouth daily before breakfast.    . loratadine-pseudoephedrine (CLARITIN-D 24-HOUR) 10-240 MG 24 hr tablet Take 1 tablet by mouth daily.    [name] losartan (COZAAR) 100 MG tablet TAKE 1 TABLET(100 MG) BY MOUTH DAILY 30 tablet 0  . montelukast (SINGULAIR) 10 MG tablet Take 1 tablet (10 mg total) by mouth at bedtime. 30 tablet 3  . OXYGEN Inhale 2 L into the lungs at bedtime as needed (at bedtime and as needed).    . polyethylene glycol powder (GLYCOLAX/MIRALAX) powder Take 17 g by mouth 2 (two) times daily as needed. 3350 g 1  . traMADol (ULTRAM) 50 MG tablet 1-2 tabs po q 6 hr prn pain Maximum dose= 8 tablets per day 20 tablet 0  . Umeclidinium-Vilanterol (ANORO ELLIPTA) 62.5-25 MCG/INH AEPB Inhale 1 puff into the lungs daily.    [name] doxycycline (VIBRA-TABS) 100 MG tablet Take 1 tablet (100 mg total) by mouth 2 (two) times daily. 14 tablet 0   Facility-Administered Medications Prior to Visit  Medication Dose Route Frequency Provider Last Rate Last Dose  . methylPREDNISolone acetate (DEPO-MEDROL) injection (RADIOLOGY ONLY) 80 mg  80 mg Intramuscular Once Burnard Hawthorne, FNP        Review of Systems;  Patient denies headache, fevers, malaise, unintentional weight loss, skin rash, eye  pain, sinus congestion and sinus pain, sore throat, dysphagia,  hemoptysis , cough, dyspnea, wheezing, chest pain, palpitations, orthopnea, edema, abdominal pain, nausea, melena, diarrhea, constipation, flank pain, dysuria, hematuria, urinary  Frequency, nocturia, numbness, tingling, seizures,  Focal weakness, Loss of consciousness,  Tremor, insomnia, depression, anxiety, and suicidal ideation.      Objective:  BP (!) 90/54   Pulse 80   Temp 97.7 F (36.5 C) (Oral)   Resp 16   Ht 5' (1.524 m)   Wt 213 lb (96.6 kg)   SpO2 96% Comment: O2 tank   BMI 41.60 kg/m   BP Readings from Last 3 Encounters:  04/11/16 (!) 90/54  03/20/16 112/62  03/13/16 116/60    Wt Readings from Last 3 Encounters:  04/11/16 213 lb (96.6 kg)  03/20/16 209 lb 13.9 oz (95.2 kg)  03/13/16 210 lb 6.4 oz (95.4 kg)    General appearance: alert, cooperative and appears stated age Ears: normal TM's and external ear canals both ears Throat: lips, mucosa, and tongue normal; teeth and gums normal Neck: no adenopathy, no carotid bruit, supple, symmetrical, trachea midline and thyroid not enlarged, symmetric, no tenderness/mass/nodules Back: symmetric, no curvature. ROM normal. No CVA tenderness. Lungs: clear to auscultation bilaterally Heart: regular rate and rhythm, S1, S2 normal, no murmur, click, rub or gallop Abdomen: soft, non-tender; bowel sounds normal; no masses,  no organomegaly Pulses: 2+ and symmetric Skin: Skin color, texture, turgor normal. No rashes or lesions Lymph nodes: Cervical, supraclavicular, and axillary nodes normal.  Lab Results  Component Value Date   HGBA1C 5.4 07/28/2015    Lab Results  Component Value Date   CREATININE 0.87 02/26/2016   CREATININE 0.83 01/04/2016   CREATININE 0.83 10/19/2015    Lab Results  Component Value Date   WBC 8.2 02/26/2016   HGB 12.2 02/26/2016   HCT 36.7 02/26/2016   PLT 170 02/26/2016   GLUCOSE 99 02/26/2016   CHOL 173 03/14/2015   TRIG 85.0 03/14/2015   HDL 57.10 03/14/2015   LDLCALC 99 03/14/2015   ALT 13 01/04/2016   AST 16 01/04/2016   NA 140 02/26/2016   K 4.7 02/26/2016   CL 105 02/26/2016   CREATININE 0.87 02/26/2016   BUN 24 (H) 02/26/2016   CO2 27 02/26/2016   TSH 1.02 03/14/2015   INR 1.1 02/14/2013   HGBA1C 5.4 07/28/2015    Dg Chest 2 View  Result Date: 02/27/2016 CLINICAL DATA:  Acute onset of central chest pain and shortness of breath. Initial encounter. EXAM: CHEST  2 VIEW COMPARISON:  Chest radiograph performed 02/13/2016, and CT of the chest performed  02/26/2016 FINDINGS: The lungs are well-aerated. Minimal left basilar atelectasis is noted. There is no evidence of pleural effusion or pneumothorax. The heart is mildly enlarged. No acute osseous abnormalities are seen. IMPRESSION: Minimal left basilar atelectasis noted.  Mild cardiomegaly. Electronically Signed   By: Garald Balding M.D.   On: 02/27/2016 00:29   Ct Chest Wo Contrast  Result Date: 02/26/2016 CLINICAL DATA:  Shortness of breath, productive cough and wheezing for 6 weeks. EXAM: CT CHEST WITHOUT CONTRAST TECHNIQUE: Multidetector CT imaging of the chest was performed following the standard protocol without IV contrast. COMPARISON:  Chest x-ray 02/13/2016.  CT 07/29/2015. FINDINGS: Comment: There is significant respiratory and patient motion throughout the study which limits diagnostic sensitivity of this study. Cardiovascular: Heart is mildly enlarged.  Aorta is normal caliber. Mediastinum/Nodes: No mediastinal, hilar, or axillary adenopathy. Again noted is significant compression of the trachea  and mainstem bronchi bilaterally tearing expiration compatible with tracheobronchomalacia. Lungs/Pleura: Linear areas of atelectasis or scarring in the lung bases, similar prior study. No other confluent opacities or pleural effusions. Upper Abdomen: Imaging into the upper abdomen shows no acute findings. Musculoskeletal: Chest wall soft tissues are unremarkable. No acute bony abnormality or focal bone lesion. IMPRESSION: Respiratory and patient motion somewhat limits diagnostic sensitivity. Despite this, there is no visible acute cardiopulmonary abnormality. Stable changes of tracheobronchomalacia. Cardiomegaly. Bibasilar atelectasis or scarring. Electronically Signed   By: Rolm Baptise M.D.   On: 02/26/2016 14:26    Assessment & Plan:   Problem List Items Addressed This Visit    Chronic respiratory failure with hypoxia ([place])    Secondary to COPD Continue supplemental oxygen and bronchodilators.   Sleep study ordered to rule out concurrent OSA      Pulmonary hypertension ([place])    Etiology may be multifactorial and includes mitral regurgitation, COPD, and possible OSA.       Severe mitral regurgitation by prior echocardiogram    I am concerned that her mitral regurgitation may be playing a signficant role in her exercise intolerance. She has pulmonary hypertension, COPD  and obesity as well. Repeat echo ordered.          I have discontinued Ms. Benevides's doxycycline. I am also having her start on doxycycline. Additionally, I am having her maintain her umeclidinium-vilanterol, loratadine-pseudoephedrine, montelukast, levothyroxine, polyethylene glycol powder, albuterol, fluticasone, fluticasone, hydrochlorothiazide, OXYGEN, traMADol, albuterol, and losartan. We will continue to administer methylPREDNISolone acetate.  Meds ordered this encounter  Medications  . doxycycline (VIBRA-TABS) 100 MG tablet    Sig: Take 1 tablet (100 mg total) by mouth 2 (two) times daily.    Dispense:  20 tablet    Refill:  0    Medications Discontinued During This Encounter  Medication Reason  . doxycycline (VIBRA-TABS) 100 MG tablet Completed Course    Follow-up: Return 6 weeks.   [name], MD

## 2016-04-11 NOTE — Patient Instructions (Signed)
  Your blood pressure was fine when I repeated it today 140/80,  Continue both losartan and hctz.  Check BP once or twice weekly and if readings are > 150 or less than 100 let me know    I am prescribing doxycycline to take for 10 days for your sinus infection. I would try to flush your sinuses daily with salt water. ALWAYS after you have been out in public during the flu season ,  To get rid of viruses you come in contact during your outings.   I'll see you again in later February after all of your tests have come back

## 2016-04-11 NOTE — Progress Notes (Signed)
Pre-visit discussion using our clinic review tool. No additional management support is needed unless otherwise documented below in the visit note.  

## 2016-04-13 DIAGNOSIS — J0100 Acute maxillary sinusitis, unspecified: Secondary | ICD-10-CM | POA: Insufficient documentation

## 2016-04-13 NOTE — Assessment & Plan Note (Signed)
She is having sinus drainage and congestion with cough productive of purulent sputum and headaches. repeat doxycycline course given multiple drug allergies/intolerance.

## 2016-04-13 NOTE — Assessment & Plan Note (Addendum)
Secondary to COPD Continue supplemental oxygen and bronchodilators.  Sleep study ordered to rule out concurrent OSA

## 2016-04-13 NOTE — Assessment & Plan Note (Signed)
I am concerned that her mitral regurgitation may be playing a signficant role in her exercise intolerance. She has pulmonary hypertension, COPD  and obesity as well. Repeat echo ordered.

## 2016-04-13 NOTE — Assessment & Plan Note (Signed)
Etiology may be multifactorial and includes mitral regurgitation, COPD, and possible OSA.

## 2016-04-15 ENCOUNTER — Other Ambulatory Visit: Payer: Self-pay

## 2016-04-15 ENCOUNTER — Ambulatory Visit (INDEPENDENT_AMBULATORY_CARE_PROVIDER_SITE_OTHER): Payer: Medicare Other

## 2016-04-15 DIAGNOSIS — I272 Pulmonary hypertension, unspecified: Secondary | ICD-10-CM | POA: Diagnosis not present

## 2016-04-15 DIAGNOSIS — I340 Nonrheumatic mitral (valve) insufficiency: Secondary | ICD-10-CM

## 2016-04-16 ENCOUNTER — Other Ambulatory Visit: Payer: Self-pay | Admitting: Internal Medicine

## 2016-04-16 DIAGNOSIS — I340 Nonrheumatic mitral (valve) insufficiency: Secondary | ICD-10-CM

## 2016-04-18 ENCOUNTER — Emergency Department: Payer: Medicare Other

## 2016-04-18 ENCOUNTER — Encounter: Payer: Self-pay | Admitting: Emergency Medicine

## 2016-04-18 ENCOUNTER — Emergency Department
Admission: EM | Admit: 2016-04-18 | Discharge: 2016-04-18 | Disposition: A | Discharge status: Home / Self Care | Payer: Medicare Other | Attending: Emergency Medicine | Admitting: Emergency Medicine

## 2016-04-18 DIAGNOSIS — Z79899 Other long term (current) drug therapy: Secondary | ICD-10-CM | POA: Insufficient documentation

## 2016-04-18 DIAGNOSIS — E039 Hypothyroidism, unspecified: Secondary | ICD-10-CM | POA: Diagnosis not present

## 2016-04-18 DIAGNOSIS — I10 Essential (primary) hypertension: Secondary | ICD-10-CM | POA: Diagnosis not present

## 2016-04-18 DIAGNOSIS — J449 Chronic obstructive pulmonary disease, unspecified: Secondary | ICD-10-CM | POA: Insufficient documentation

## 2016-04-18 DIAGNOSIS — F410 Panic disorder [episodic paroxysmal anxiety] without agoraphobia: Secondary | ICD-10-CM | POA: Insufficient documentation

## 2016-04-18 DIAGNOSIS — J9811 Atelectasis: Secondary | ICD-10-CM | POA: Diagnosis not present

## 2016-04-18 MED ORDER — LORAZEPAM 0.5 MG PO TABS: 0.5000 mg | ORAL_TABLET | Freq: Three times a day (TID) | ORAL | 0 refills | Status: DC | PRN

## 2016-04-18 NOTE — ED Provider Notes (Signed)
[facility] Emergency Department Provider Note        Time seen: ----------------------------------------- 6:21 PM on 04/18/2016 -----------------------------------------    I have reviewed the triage vital signs and the nursing notes.   HISTORY  Chief Complaint Panic Attack    HPI Lauren Bartlett is a 61 y.o. female who presents to the ER for panic attack. Patient was speaking with the cardiologist on the phone and upon hearing the news that she would need mitral valve replacement she began feeling anxious like she was having a panic attack. She denies any recent illness or complaints at this time.   Past Medical History:  Diagnosis Date  . Anxiety   . Congenital absence of one kidney   . COPD (chronic obstructive pulmonary disease) ([place])   . Depression    treated at [facility]  . Emphysema/COPD ([place])   . History of sciatica   . Hypertension   . hypothyrodism   . Hypothyroidism   . Motion sickness    cars  . MVP (mitral valve prolapse)   . PONV (postoperative nausea and vomiting)   . Pulmonary hypertension   . Sleep apnea    O2 at night and PRN    Patient Active Problem List   Diagnosis Date Noted  . Sinusitis, acute maxillary 04/13/2016  . 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  . Constipation 08/05/2015  . Hospital discharge follow-up 08/05/2015  . Chronic respiratory failure with hypoxia ([place])   . Encounter for preventive health examination 03/14/2015  . Breast cancer screening 03/14/2015  . Need for hepatitis C screening test 03/14/2015  . Vitamin D deficiency 01/11/2015  . S/P hysterectomy with oophorectomy 09/22/2013  . Pulmonary hypertension ([place]) 07/14/2013  . Hypothyroidism 05/04/2012  . Severe mitral regurgitation by prior echocardiogram 01/11/2012  . Obesity (BMI 30-39.9) 08/04/2011  . Depression   . Hypertension   . Congenital absence of one kidney   . History of sciatica     Past Surgical History:  Procedure Laterality Date   . ABDOMINAL HYSTERECTOMY    . BREAST BIOPSY Right 2011   [facility]< benign  . CATARACT EXTRACTION W/PHACO Left 11/14/2015   Procedure: CATARACT EXTRACTION PHACO AND INTRAOCULAR LENS PLACEMENT (IOC);  Surgeon: Leandrew Koyanagi, MD;  Location: [facility];  Service: Ophthalmology;  Laterality: Left;  sleep apnea [place]  . CATARACT EXTRACTION W/PHACO Right 12/19/2015   Procedure: CATARACT EXTRACTION PHACO AND INTRAOCULAR LENS PLACEMENT (IOC);  Surgeon: Leandrew Koyanagi, MD;  Location: [facility];  Service: Ophthalmology;  Laterality: Right;  ANXIETY GENEROUS IV SEDATION TORIC LEN  . COMBINED HYSTERECTOMY ABDOMINAL W/ A&P REPAIR / OOPHORECTOMY  1996   benign tumor  . INNER EAR SURGERY     bilateral    Allergies Codeine; Penicillins; Zithromax [azithromycin]; and Prednisone  Social History Social History  Substance Use Topics  . Smoking status: Never Smoker  . Smokeless tobacco: Never Used  . Alcohol use No    Review of Systems Constitutional: Negative for fever. Cardiovascular: Negative for chest pain. Respiratory: Negative for shortness of breath. Gastrointestinal: Negative for abdominal pain, vomiting and diarrhea. Skin: Negative for rash. Neurological: Negative for headaches, focal weakness or numbness. Psychiatric: Positive for anxiety  10-point ROS otherwise negative.  ____________________________________________   PHYSICAL EXAM:  VITAL SIGNS: ED Triage Vitals  Enc Vitals Group     BP 04/18/16 1540 136/84     Pulse Rate 04/18/16 1540 (!) 106     Resp 04/18/16 1540 16     Temp 04/18/16 1540 98.6  F (37 C)     Temp Source 04/18/16 1540 Oral     SpO2 04/18/16 1540 98 %     Weight 04/18/16 1540 217 lb (98.4 kg)     Height 04/18/16 1540 5' (1.524 m)     Head Circumference --      Peak Flow --      Pain Score 04/18/16 1541 0     Pain Loc --      Pain Edu? --      Excl. in [place]? --     Constitutional: Alert and oriented. Well appearing and in no  distress. Eyes: Conjunctivae are normal. PERRL. Normal extraocular movements. ENT   Head: Normocephalic and atraumatic.   Nose: No congestion/rhinnorhea.   Mouth/Throat: Mucous membranes are moist.   Neck: No stridor. Cardiovascular: Normal rate, regular rhythm. Loud systolic murmur Respiratory: Normal respiratory effort without tachypnea nor retractions. Breath sounds are clear and equal bilaterally. No wheezes/rales/rhonchi. Gastrointestinal: Soft and nontender. Normal bowel sounds Musculoskeletal: Nontender with normal range of motion in all extremities. No lower extremity tenderness nor edema. Neurologic:  Normal speech and language. No gross focal neurologic deficits are appreciated.  Skin:  Skin is warm, dry and intact. No rash noted. Psychiatric: Depressed mood and affect ____________________________________________  ED COURSE:  Pertinent labs & imaging results that were available during my care of the patient were reviewed by me and considered in my medical decision making (see chart for details). Patient presents to the ER in no distress, likely panic attack.   Procedures ____________________________________________   RADIOLOGY Images were viewed by me  Chest x-ray is unremarkable  ____________________________________________  FINAL ASSESSMENT AND PLAN  Panic attack  Plan: Patient with imaging as dictated above. Patient is in no distress, I will prescribe Ativan for her to take as needed. She is stable for outpatient follow-up with cardiology.   Earleen Newport, MD   Note: This note was generated in part or whole with voice recognition software. Voice recognition is usually quite accurate but there are transcription errors that can and very often do occur. I apologize for any typographical errors that were not detected and corrected.     Earleen Newport, MD 04/18/16 Vernelle Emerald

## 2016-04-18 NOTE — ED Triage Notes (Signed)
After speaking with cardiologist on the phone and hearing the news that she needs her Mitral valve replaced, patient became anxious and felt as if she was having a panic attack.

## 2016-04-21 ENCOUNTER — Telehealth: Payer: Self-pay | Admitting: Internal Medicine

## 2016-04-21 NOTE — Telephone Encounter (Signed)
Patient has cardiology appt 05/13/16

## 2016-04-21 NOTE — Telephone Encounter (Signed)
Pt called back stating she was called regarding results from 04/15/16 Thank you!

## 2016-04-22 DIAGNOSIS — J449 Chronic obstructive pulmonary disease, unspecified: Secondary | ICD-10-CM | POA: Diagnosis not present

## 2016-04-24 ENCOUNTER — Ambulatory Visit: Payer: Medicare Other | Attending: Internal Medicine

## 2016-04-24 DIAGNOSIS — J398 Other specified diseases of upper respiratory tract: Secondary | ICD-10-CM | POA: Insufficient documentation

## 2016-04-25 ENCOUNTER — Other Ambulatory Visit: Payer: Self-pay | Admitting: Internal Medicine

## 2016-04-27 ENCOUNTER — Other Ambulatory Visit: Payer: Self-pay | Admitting: Internal Medicine

## 2016-04-27 ENCOUNTER — Ambulatory Visit
Admission: EM | Admit: 2016-04-27 | Discharge: 2016-04-27 | Disposition: A | Discharge status: Home / Self Care | Payer: Medicare Other | Attending: Family Medicine | Admitting: Family Medicine

## 2016-04-27 ENCOUNTER — Encounter: Payer: Self-pay | Admitting: Gynecology

## 2016-04-27 DIAGNOSIS — J441 Chronic obstructive pulmonary disease with (acute) exacerbation: Secondary | ICD-10-CM | POA: Diagnosis not present

## 2016-04-27 MED ORDER — DOXYCYCLINE HYCLATE 100 MG PO TABS: 100.0000 mg | ORAL_TABLET | Freq: Two times a day (BID) | ORAL | 0 refills | Status: DC

## 2016-04-27 MED ORDER — METHYLPREDNISOLONE SODIUM SUCC 125 MG IJ SOLR
125.0000 mg | Freq: Once | INTRAMUSCULAR | Status: AC
  Administered 2016-04-27: 125 mg via INTRAMUSCULAR

## 2016-04-27 NOTE — ED Provider Notes (Signed)
[facility]    CSN: [id number] Arrival date & time: 04/27/16  1223     History   Chief Complaint Chief Complaint  Patient presents with  . Wheezing    HPI Lauren Bartlett is a 61 y.o. female.   The history is provided by the patient.  Wheezing  Onset quality:  Sudden Associated symptoms: cough   Associated symptoms: no headaches   URI  Presenting symptoms: cough   Severity:  Moderate Onset quality:  Sudden Duration:  1 week Timing:  Constant Progression:  Worsening Chronicity:  New Associated symptoms: wheezing   Associated symptoms: no headaches, no myalgias and no sinus pain   Associated symptoms comment:  Cough is productive of sputum Risk factors: chronic respiratory disease (copd) and sick contacts   Risk factors: not elderly, no chronic cardiac disease, no chronic kidney disease, no diabetes mellitus, no immunosuppression, no recent illness and no recent travel     Past Medical History:  Diagnosis Date  . Anxiety   . Congenital absence of one kidney   . COPD (chronic obstructive pulmonary disease) ([place])   . Depression    treated at [place]  . Emphysema/COPD ([place])   . History of sciatica   . Hypertension   . hypothyrodism   . Hypothyroidism   . Motion sickness    cars  . MVP (mitral valve prolapse)   . PONV (postoperative nausea and vomiting)   . Pulmonary hypertension   . Sleep apnea    O2 at night and PRN    Patient Active Problem List   Diagnosis Date Noted  . Sinusitis, acute maxillary 04/13/2016  . 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  . Constipation 08/05/2015  . Hospital discharge follow-up 08/05/2015  . Chronic respiratory failure with hypoxia ([place])   . Encounter for preventive health examination 03/14/2015  . Breast cancer screening 03/14/2015  . Need for hepatitis C screening test 03/14/2015  . Vitamin D deficiency 01/11/2015  . S/P hysterectomy with oophorectomy 09/22/2013  . Pulmonary hypertension ([place]) 07/14/2013  .  Hypothyroidism 05/04/2012  . Severe mitral regurgitation by prior echocardiogram 01/11/2012  . Obesity (BMI 30-39.9) 08/04/2011  . Depression   . Hypertension   . Congenital absence of one kidney   . History of sciatica     Past Surgical History:  Procedure Laterality Date  . ABDOMINAL HYSTERECTOMY    . BREAST BIOPSY Right 2011   [facility]< benign  . CATARACT EXTRACTION W/PHACO Left 11/14/2015   Procedure: CATARACT EXTRACTION PHACO AND INTRAOCULAR LENS PLACEMENT (IOC);  Surgeon: Leandrew Koyanagi, MD;  Location: [place];  Service: Ophthalmology;  Laterality: Left;  sleep apnea Toric  . CATARACT EXTRACTION W/PHACO Right 12/19/2015   Procedure: CATARACT EXTRACTION PHACO AND INTRAOCULAR LENS PLACEMENT (IOC);  Surgeon: Leandrew Koyanagi, MD;  [facility];  Service: Ophthalmology;  Laterality: Right;  ANXIETY GENEROUS IV SEDATION TORIC LEN  . COMBINED HYSTERECTOMY ABDOMINAL W/ A&P REPAIR / OOPHORECTOMY  1996   benign tumor  . INNER EAR SURGERY     bilateral    OB History    No data available       Home Medications    Prior to Admission medications   Medication Sig Start Date End Date Taking? Authorizing Provider  albuterol (PROVENTIL HFA;VENTOLIN HFA) 108 (90 Base) MCG/ACT inhaler Inhale 2 puffs into the lungs every 6 (six) hours as needed for wheezing or shortness of breath. Pt uses with SPACER.   Yes Historical Provider, MD  albuterol (PROVENTIL) (2.5 MG/3ML) 0.083%  nebulizer solution Take 2.5 mg by nebulization every 6 (six) hours as needed for wheezing or shortness of breath.   Yes Historical Provider, MD  fluticasone (FLONASE) 50 MCG/ACT nasal spray Place 2 sprays into both nostrils daily.   Yes Historical Provider, MD  fluticasone (FLOVENT HFA) 110 MCG/ACT inhaler Inhale 1 puff into the lungs 2 (two) times daily.   Yes Historical Provider, MD  hydrochlorothiazide (HYDRODIURIL) 25 MG tablet TAKE 1/2 TABLET(12.5 MG) BY MOUTH DAILY 10/22/15  Yes [name], MD  levothyroxine (SYNTHROID, LEVOTHROID) 112 MCG tablet Take 112 mcg by mouth daily before breakfast.   Yes Historical Provider, MD  loratadine-pseudoephedrine (CLARITIN-D 24-HOUR) 10-240 MG 24 hr tablet Take 1 tablet by mouth daily.   Yes Historical Provider, MD  LORazepam (ATIVAN) 0.5 MG tablet Take 1 tablet (0.5 mg total) by mouth every 8 (eight) hours as needed for anxiety. 04/18/16 04/18/17 Yes Earleen Newport, MD  losartan (COZAAR) 100 MG tablet TAKE 1 TABLET(100 MG) BY MOUTH DAILY 04/01/16  Yes [name], MD  montelukast (SINGULAIR) 10 MG tablet Take 1 tablet (10 mg total) by mouth at bedtime. 06/05/15  Yes Fritzi Mandes, MD  OXYGEN Inhale 2 L into the lungs at bedtime as needed (at bedtime and as needed).   Yes Historical Provider, MD  polyethylene glycol powder (GLYCOLAX/MIRALAX) powder Take 17 g by mouth 2 (two) times daily as needed. 08/03/15  Yes [name], MD  traMADol (ULTRAM) 50 MG tablet 1-2 tabs po q 6 hr prn pain Maximum dose= 8 tablets per day 11/09/15  Yes Melynda Ripple, MD  Umeclidinium-Vilanterol [facility] ELLIPTA) 62.5-25 MCG/INH AEPB Inhale 1 puff into the lungs daily.   Yes Historical Provider, MD  doxycycline (VIBRA-TABS) 100 MG tablet Take 1 tablet (100 mg total) by mouth 2 (two) times daily. 04/27/16   Norval Gable, MD    Family History Family History  Problem Relation Age of Onset  . Multiple sclerosis Mother   . Coronary artery disease Father   . Heart disease Brother   . Breast cancer Paternal Aunt     Social History Social History  Substance Use Topics  . Smoking status: Never Smoker  . Smokeless tobacco: Never Used  . Alcohol use No     Allergies   Codeine; Penicillins; Zithromax [azithromycin]; and Prednisone   Review of Systems Review of Systems  HENT: Negative for sinus pain.   Respiratory: Positive for cough and wheezing.   Musculoskeletal: Negative for myalgias.  Neurological: Negative for headaches.     Physical Exam Triage  Vital Signs ED Triage Vitals  Enc Vitals Group     BP 04/27/16 1307 (!) 118/55     Pulse Rate 04/27/16 1307 83     Resp 04/27/16 1307 16     Temp 04/27/16 1307 98.4 F (36.9 C)     Temp Source 04/27/16 1307 Oral     SpO2 04/27/16 1307 100 %     Weight 04/27/16 1308 217 lb (98.4 kg)     Height --      Head Circumference --      Peak Flow --      Pain Score 04/27/16 1435 0     Pain Loc --      Pain Edu? --      Excl. in [place]? --    No data found.   Updated Vital Signs BP (!) 118/55 (BP Location: Left Arm)   Pulse 83   Temp 98.4 F (36.9 C) (  Oral)   Resp 16   Wt 217 lb (98.4 kg)   SpO2 100%   BMI 42.38 kg/m   Visual Acuity Right Eye Distance:   Left Eye Distance:   Bilateral Distance:    Right Eye Near:   Left Eye Near:    Bilateral Near:     Physical Exam  Constitutional: She appears well-developed and well-nourished. No distress.  HENT:  Head: Normocephalic and atraumatic.  Right Ear: Tympanic membrane, external ear and ear canal normal.  Left Ear: Tympanic membrane, external ear and ear canal normal.  Nose: No mucosal edema, rhinorrhea, nose lacerations, sinus tenderness, nasal deformity, septal deviation or nasal septal hematoma. No epistaxis.  No foreign bodies. Right sinus exhibits no maxillary sinus tenderness and no frontal sinus tenderness. Left sinus exhibits no maxillary sinus tenderness and no frontal sinus tenderness.  Mouth/Throat: Uvula is midline, oropharynx is clear and moist and mucous membranes are normal. No oropharyngeal exudate.  Eyes: Conjunctivae and EOM are normal. Pupils are equal, round, and reactive to light. Right eye exhibits no discharge. Left eye exhibits no discharge. No scleral icterus.  Neck: Normal range of motion. Neck supple. No thyromegaly present.  Cardiovascular: Normal rate, regular rhythm and normal heart sounds.   Pulmonary/Chest: Effort normal. No respiratory distress. She has wheezes (few, diffuse, mild rhonchi). She has no  rales.  Lymphadenopathy:    She has no cervical adenopathy.  Skin: She is not diaphoretic.  Nursing note and vitals reviewed.    UC Treatments / Results  Labs (all labs ordered are listed, but only abnormal results are displayed) Labs Reviewed - No data to display  EKG  EKG Interpretation None       Radiology No results found.  Procedures Procedures (including critical care time)  Medications Ordered in UC Medications  methylPREDNISolone sodium succinate (SOLU-MEDROL) 125 mg/2 mL injection 125 mg (125 mg Intramuscular Given 04/27/16 1425)     Initial Impression / Assessment and Plan / UC Course  I have reviewed the triage vital signs and the nursing notes.  Pertinent labs & imaging results that were available during my care of the patient were reviewed by me and considered in my medical decision making (see chart for details).       Final Clinical Impressions(s) / UC Diagnoses   Final diagnoses:  COPD with acute exacerbation ([place])    New Prescriptions Discharge Medication List as of 04/27/2016  2:29 PM     1.diagnosis reviewed with patient 2. rx as per orders above; reviewed possible side effects, interactions, risks and benefits  3. Recommend supportive treatment with continue inhalers at home 4. Patient given solumedrol 125mg  IM x 1 5.  Follow-up prn if symptoms worsen or don't improve   Norval Gable, MD 04/27/16 1447

## 2016-04-27 NOTE — ED Triage Notes (Signed)
Patient stated was seen x 3 days ago at the ER for SOB . She also stated stated seen by primary care on 04/18/16 for wheezing and coughing. Per patient does not have SOB anymore but wheezing started again this morning.

## 2016-04-28 ENCOUNTER — Telehealth: Payer: Self-pay | Admitting: Internal Medicine

## 2016-04-28 MED ORDER — UMECLIDINIUM-VILANTEROL 62.5-25 MCG/INH IN AEPB: 1.0000 | INHALATION_SPRAY | Freq: Every day | RESPIRATORY_TRACT | 11 refills | Status: DC

## 2016-04-28 NOTE — Telephone Encounter (Signed)
Spoke with patient she was was told she has Losartan 100 mg

## 2016-04-28 NOTE — Telephone Encounter (Signed)
Patient was requesting Rx Anoro inhaler. Message sent 04/25/16 Please advise

## 2016-04-28 NOTE — Telephone Encounter (Signed)
Pt called and stated that she received a letter from [facility] stating that she is now taking losartan POT. She is not sure what this is, she thought that she was taking the plain losartan. Please advise, thank you!  Call pt @ [phone]

## 2016-04-28 NOTE — Telephone Encounter (Signed)
Pt last refill on Anoro Ellipta Inhaler was on 01/29/16. Pt last OV was on 04/11/16. Ok to refill?

## 2016-04-28 NOTE — Telephone Encounter (Signed)
LOSARTAN AND LOSARTAN POT ARE THE SAME MEDICATION  ANORO RX SENT

## 2016-04-29 ENCOUNTER — Other Ambulatory Visit: Payer: Self-pay | Admitting: Internal Medicine

## 2016-04-29 ENCOUNTER — Ambulatory Visit: Payer: Medicare Other | Attending: Otolaryngology

## 2016-04-29 ENCOUNTER — Encounter: Payer: Self-pay | Admitting: Internal Medicine

## 2016-04-29 DIAGNOSIS — E034 Atrophy of thyroid (acquired): Secondary | ICD-10-CM

## 2016-04-29 DIAGNOSIS — G478 Other sleep disorders: Secondary | ICD-10-CM | POA: Insufficient documentation

## 2016-04-29 DIAGNOSIS — G4730 Sleep apnea, unspecified: Secondary | ICD-10-CM | POA: Diagnosis not present

## 2016-04-29 DIAGNOSIS — I10 Essential (primary) hypertension: Secondary | ICD-10-CM

## 2016-04-29 DIAGNOSIS — G4761 Periodic limb movement disorder: Secondary | ICD-10-CM | POA: Diagnosis not present

## 2016-04-29 DIAGNOSIS — G4733 Obstructive sleep apnea (adult) (pediatric): Secondary | ICD-10-CM | POA: Diagnosis present

## 2016-04-29 DIAGNOSIS — J984 Other disorders of lung: Secondary | ICD-10-CM | POA: Diagnosis not present

## 2016-04-29 DIAGNOSIS — R0683 Snoring: Secondary | ICD-10-CM | POA: Diagnosis not present

## 2016-04-29 NOTE — Telephone Encounter (Signed)
Pt last refill on levothyroxine was on 01/29/16. Pt last OV was on 04/11/16 but hasn't had thyroid checked. Pt has had cbc and cmp drawn on 01/04/16. Ok to refill or needs labs?

## 2016-04-29 NOTE — Telephone Encounter (Signed)
Patient notified of medication to pharmacy

## 2016-04-30 NOTE — Telephone Encounter (Signed)
Lab Results  Component Value Date   TSH 1.02 03/14/2015     Refill for 30 days only.  LAB VISIT prior to any more refills

## 2016-04-30 NOTE — Telephone Encounter (Signed)
Can you call pt to schedule OV for further refills on thyroid medication? Thank you.

## 2016-05-01 NOTE — Telephone Encounter (Signed)
Pt has an appt. On 05/23/16.

## 2016-05-05 ENCOUNTER — Telehealth: Payer: Self-pay | Admitting: Internal Medicine

## 2016-05-05 DIAGNOSIS — G478 Other sleep disorders: Secondary | ICD-10-CM

## 2016-05-05 NOTE — Telephone Encounter (Signed)
Printed and faxed to [facility] for Dr. Tami Ribas

## 2016-05-05 NOTE — Telephone Encounter (Signed)
Pt informed - she does not want to see either of the MDs you mentioned below. She is requesting Dr. Tami Ribas instead. Please advise.

## 2016-05-05 NOTE — Telephone Encounter (Signed)
Her sleep study did NOT show that she had sleep apnea. Her trouble sleeping is due to an upper airway resistance syndrome which may be treated with a simple mouth guard  That keeps her jaw from falling backward and blocking her airway .  If Dr Alva Garnet cannot help her with this I can refer her to  see Dr Kathyrn Sheriff, he is an ear nose and throat doctor and he is the one who interpreted her sleep study

## 2016-05-05 NOTE — Telephone Encounter (Signed)
Referral to dr Tami Ribas in process ,  Lenna Sciara he will need a copy of the sleep study (I'll supply) and my last note and Dr Lora Havens last note

## 2016-05-07 ENCOUNTER — Ambulatory Visit: Payer: Medicare Other | Admitting: Cardiology

## 2016-05-08 DIAGNOSIS — J449 Chronic obstructive pulmonary disease, unspecified: Secondary | ICD-10-CM | POA: Diagnosis not present

## 2016-05-13 ENCOUNTER — Telehealth: Payer: Self-pay | Admitting: Cardiovascular Disease

## 2016-05-13 ENCOUNTER — Ambulatory Visit (INDEPENDENT_AMBULATORY_CARE_PROVIDER_SITE_OTHER): Payer: Medicare Other | Admitting: Cardiovascular Disease

## 2016-05-13 ENCOUNTER — Encounter (INDEPENDENT_AMBULATORY_CARE_PROVIDER_SITE_OTHER): Payer: Self-pay

## 2016-05-13 ENCOUNTER — Encounter: Payer: Self-pay | Admitting: Cardiovascular Disease

## 2016-05-13 VITALS — BP 114/68 | HR 87 | Ht 62.0 in | Wt 218.8 lb

## 2016-05-13 DIAGNOSIS — I340 Nonrheumatic mitral (valve) insufficiency: Secondary | ICD-10-CM | POA: Diagnosis not present

## 2016-05-13 DIAGNOSIS — Z01812 Encounter for preprocedural laboratory examination: Secondary | ICD-10-CM

## 2016-05-13 DIAGNOSIS — I2722 Pulmonary hypertension due to left heart disease: Secondary | ICD-10-CM

## 2016-05-13 DIAGNOSIS — I38 Endocarditis, valve unspecified: Secondary | ICD-10-CM

## 2016-05-13 DIAGNOSIS — I272 Pulmonary hypertension, unspecified: Secondary | ICD-10-CM | POA: Diagnosis not present

## 2016-05-13 NOTE — Telephone Encounter (Signed)
TEE scheduled Feb 19, arrival 6:30am Right and left cardiac cath scheduled Feb 22, arrival 8:30am S/w pt who is agreeable w/dates and times.

## 2016-05-13 NOTE — Progress Notes (Signed)
Cardiology Office Note   Date:  05/13/2016   ID:  Lauren Bartlett, DOB [birth year], MRN [id number]  PCP:  [name], MD  Cardiologist:  New  Chief Complaint  Patient presents with  . other    Ref by Dr. Derrel Nip to follow up on an Echo from 04/15/2016; former patient of Dr. Clayborn Bigness. Meds reviewed by the pt. verbally. Pt. c/o shortness of breath with little exertion.       History of Present Illness: Lauren Bartlett is a 61 y.o. female who was referred by Dr. Derrel Nip for evaluation of mitral regurgitation. The patient has chronic medical conditions that include COPD, mitral valve prolapse, essential hypertension, anxiety and depression. She is a lifelong nonsmoker but reports being exposed to smoking. The patient had multiple recent emergency room visits for "COPD exacerbations". She was evaluated by Dr. Alva Garnet in December and was found to have moderate COPD. 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 She has history of sleep apnea with intolerance to CPAP. It was felt that her shortness of breath was out of proportion to pulmonary findings. She was referred for a repeat echocardiogram in our office which showed normal LV systolic function, grade 2 diastolic dysfunction, calcified mitral valve annulus with severe prolapse involving the posterior leaflet with severe anterior mitral regurgitation, moderately dilated left atrium and moderate to severe pulmonary hypertension with a systolic pulmonary pressure of 60 mmHg. She was previously followed by Dr. Clayborn Bigness for mild to moderate mitral regurgitation.  The patient is very limited due to shortness of breath with minimal activities, orthopnea and PND. She walks with a cane.   Past Medical History:  Diagnosis Date  . Anxiety   . Congenital absence of one kidney   . COPD (chronic obstructive pulmonary disease) ([place])   . Depression    treated at [place]  . Emphysema/COPD ([place])   . History of sciatica   . Hypertension   .  hypothyrodism   . Hypothyroidism   . Motion sickness    cars  . MVP (mitral valve prolapse)   . PONV (postoperative nausea and vomiting)   . Pulmonary hypertension   . Sleep apnea    O2 at night and PRN    Past Surgical History:  Procedure Laterality Date  . ABDOMINAL HYSTERECTOMY    . BREAST BIOPSY Right 2011   [facility]< benign  . CATARACT EXTRACTION W/PHACO Left 11/14/2015   Procedure: CATARACT EXTRACTION PHACO AND INTRAOCULAR LENS PLACEMENT (IOC);  Surgeon: Leandrew Koyanagi, MD;  Location: [place];  Service: Ophthalmology;  Laterality: Left;  sleep apnea Toric  . CATARACT EXTRACTION W/PHACO Right 12/19/2015   Procedure: CATARACT EXTRACTION PHACO AND INTRAOCULAR LENS PLACEMENT (IOC);  Surgeon: Leandrew Koyanagi, MD;  Location: [place];  Service: Ophthalmology;  Laterality: Right;  ANXIETY GENEROUS IV SEDATION TORIC LEN  . COMBINED HYSTERECTOMY ABDOMINAL W/ A&P REPAIR / OOPHORECTOMY  1996   benign tumor  . INNER EAR SURGERY     bilateral     Current Outpatient Prescriptions  Medication Sig Dispense Refill  . albuterol (PROVENTIL HFA;VENTOLIN HFA) 108 (90 Base) MCG/ACT inhaler Inhale 2 puffs into the lungs every 6 (six) hours as needed for wheezing or shortness of breath. Pt uses with SPACER.    [name] albuterol (PROVENTIL) (2.5 MG/3ML) 0.083% nebulizer solution Take 2.5 mg by nebulization every 6 (six) hours as needed for wheezing or shortness of breath.    . doxycycline (VIBRA-TABS) 100 MG tablet Take 1 tablet (100 mg total)  by mouth 2 (two) times daily. 20 tablet 0  . fluticasone (FLONASE) 50 MCG/ACT nasal spray Place 2 sprays into both nostrils daily.    . fluticasone (FLOVENT HFA) 110 MCG/ACT inhaler Inhale 1 puff into the lungs 2 (two) times daily.    . hydrochlorothiazide (HYDRODIURIL) 25 MG tablet TAKE 1/2 TABLET(12.5 MG) BY MOUTH DAILY 45 tablet 1  . levothyroxine (SYNTHROID, LEVOTHROID) 112 MCG tablet Take 112 mcg by mouth daily before breakfast.    .  loratadine-pseudoephedrine (CLARITIN-D 24-HOUR) 10-240 MG 24 hr tablet Take 1 tablet by mouth daily.    [name] LORazepam (ATIVAN) 0.5 MG tablet Take 1 tablet (0.5 mg total) by mouth every 8 (eight) hours as needed for anxiety. 30 tablet 0  . losartan (COZAAR) 100 MG tablet TAKE 1 TABLET(100 MG) BY MOUTH DAILY 30 tablet 0  . montelukast (SINGULAIR) 10 MG tablet Take 1 tablet (10 mg total) by mouth at bedtime. 30 tablet 3  . OXYGEN Inhale 2 L into the lungs at bedtime as needed (at bedtime and as needed).    . polyethylene glycol powder (GLYCOLAX/MIRALAX) powder Take 17 g by mouth 2 (two) times daily as needed. 3350 g 1  . traMADol (ULTRAM) 50 MG tablet 1-2 tabs po q 6 hr prn pain Maximum dose= 8 tablets per day 20 tablet 0  . umeclidinium-vilanterol (ANORO ELLIPTA) 62.5-25 MCG/INH AEPB Inhale 1 puff into the lungs daily. 1 each 11   Current Facility-Administered Medications  Medication Dose Route Frequency Provider Last Rate Last Dose  . methylPREDNISolone acetate (DEPO-MEDROL) injection (RADIOLOGY ONLY) 80 mg  80 mg Intramuscular Once Burnard Hawthorne, FNP        Allergies:   Codeine; Penicillins; Zithromax [azithromycin]; and Prednisone    Social History:  The patient  reports that she has never smoked. She has never used smokeless tobacco. She reports that she does not drink alcohol or use drugs.   Family History:  The patient's family history includes Breast cancer in her paternal aunt; Coronary artery disease in her father; Heart disease in her brother; Multiple sclerosis in her mother.    ROS:  Please see the history of present illness.   Otherwise, review of systems are positive for none.   All other systems are reviewed and negative.    PHYSICAL EXAM: VS:  BP 114/68 (BP Location: Right Arm, Patient Position: Sitting, Cuff Size: Normal)   Pulse 87   Ht 5\' 2"  (1.575 m)   Wt 218 lb 12 oz (99.2 kg)   BMI 40.01 kg/m  , BMI Body mass index is 40.01 kg/m. GEN: Well nourished, well  developed, in no acute distress  HEENT: normal  Neck: no JVD, carotid bruits, or masses Cardiac: RRR; no rubs, or gallops,no edema . 3/6 holosystolic murmur at the apex radiating to the left sternal border and the base of the heart. Respiratory:  Mild expiratory wheezing, normal work of breathing GI: soft, nontender, nondistended, + BS MS: no deformity or atrophy  Skin: warm and dry, no rash Neuro:  Strength and sensation are intact Psych: euthymic mood, full affect   EKG:  EKG is ordered today. The ekg ordered today demonstrates normal sinus rhythm with no significant ST or T wave changes.   Recent Labs: 09/04/2015: B Natriuretic Peptide 82.0; Magnesium 1.9 01/04/2016: ALT 13 02/26/2016: BUN 24; Creatinine, Ser 0.87; Hemoglobin 12.2; Platelets 170; Potassium 4.7; Sodium 140    Lipid Panel    Component Value Date/Time   CHOL 173 03/14/2015 1048  TRIG 85.0 03/14/2015 1048   HDL 57.10 03/14/2015 1048   CHOLHDL 3 03/14/2015 1048   VLDL 17.0 03/14/2015 1048   LDLCALC 99 03/14/2015 1048      Wt Readings from Last 3 Encounters:  05/13/16 218 lb 12 oz (99.2 kg)  04/27/16 217 lb (98.4 kg)  04/18/16 217 lb (98.4 kg)      PAD Screen 05/13/2016  Previous PAD dx? No  Previous surgical procedure? No  Pain with walking? No  Feet/toe relief with dangling? No  Painful, non-healing ulcers? No  Extremities discolored? No      ASSESSMENT AND PLAN:  1.  Severe mitral regurgitation due to mitral valve prolapse: She appears to be symptomatic. I suspect that her dyspnea is multifactorial due to mitral regurgitation, COPD and physical deconditioning. I recommend proceeding with a transesophageal echocardiogram followed by a right and left cardiac catheterization with anticipation of mitral valve repair as long as his COPD he is not severe enough to be prohibitive. I discussed the procedures and details as well as risks and benefits. She was very emotional but agreeable to proceed.  2.  Pulmonary hypertension likely due to mitral regurgitation. This will be evaluated with a right heart catheterization.    Disposition:   FU with me in 1 month  Signed,  Kathlyn Sacramento, MD  05/13/2016 2:26 PM    [place]

## 2016-05-13 NOTE — Patient Instructions (Addendum)
Medication Instructions:  Your physician recommends that you continue on your current medications as directed. Please refer to the Current Medication list given to you today.  Labwork: BMET, CBC, PT/INR today  Testing/Procedures: Your physician has requested that you have a TEE. During a TEE, sound waves are used to create images of your heart. It provides your doctor with information about the size and shape of your heart and how well your heart's chambers and valves are working. In this test, a transducer is attached to the end of a flexible tube that's guided down your throat and into your esophagus (the tube leading from you mouth to your stomach) to get a more detailed image of your heart. You are not awake for the procedure. Please see the instruction sheet given to you today. For further information please visit [email or web address]. Nothing to eat or drink the morning of your TEE. Date: __________________________ Time:___________________________  Day of your procedure: Arrive at the [facility] entrance.  Free valet service is available.  After entering the [facility] please check-in at the registration desk (1st desk on your right) to receive your armband. After receiving your armband someone will escort you to the TEE procedure waiting area.   Your physician has requested that you have a cardiac catheterization. Cardiac catheterization is used to diagnose and/or treat various heart conditions. Doctors may recommend this procedure for a number of different reasons. The most common reason is to evaluate chest pain. Chest pain can be a symptom of coronary artery disease (CAD), and cardiac catheterization can show whether plaque is narrowing or blocking your heart's arteries. This procedure is also used to evaluate the valves, as well as measure the blood flow and oxygen levels in different parts of your heart. For further information please visit [email or web address]. Please follow  instruction sheet, as given.  [facility] Cardiac Cath Instructions   You are scheduled for a Cardiac Cath on:_________________________  Please arrive at _______am on the day of your procedure  Please expect a call from our Deputy to pre-register you  Do not eat/drink anything after midnight  Someone will need to drive you home  It is recommended someone be with you for the first 24 hours after your procedure  Wear clothes that are easy to get on/off and wear slip on shoes if possible   Medications bring a current list of all medications with you  xx___ Do not take these medications before your procedure: Do not take hydrochlorothiazide the morning of your procedure.   Day of your procedure: Arrive at the [facility] entrance.  Free valet service is available.  After entering the [facility] please check-in at the registration desk (1st desk on your right) to receive your armband. After receiving your armband someone will escort you to the cardiac cath/special procedures waiting area.  The usual length of stay after your procedure is about 2 to 3 hours.  This can vary.  If you have any questions, please call our office at [phone], or you may call the cardiac cath lab at [facility] directly at [phone]   Follow-Up: Your physician recommends that you schedule a follow-up appointment with [name] after your procedures.    Any Other Special Instructions Will Be Listed Below (If Applicable).     If you need a refill on your cardiac medications before your next appointment, please call your pharmacy.   Transesophageal Echocardiogram Transesophageal echocardiography (TEE) is a picture test of your heart using sound waves. The  pictures taken can give very detailed pictures of your heart. This can help your doctor see if there are problems with your heart. TEE can check:  If your heart has blood clots in it.  How well your heart valves are  working.  If you have an infection on the inside of your heart.  Some of the major arteries of your heart.  If your heart valve is working after a Office manager.  Your heart before a procedure that uses a shock to your heart to get the rhythm back to normal. What happens before the procedure?  Do not eat or drink for 6 hours before the procedure or as told by your doctor.  Make plans to have someone drive you home after the procedure. Do not drive yourself home.  An IV tube will be put in your arm. What happens during the procedure?  You will be given a medicine to help you relax (sedative). It will be given through the IV tube.  A numbing medicine will be sprayed or gargled in the back of your throat to help numb it.  The tip of the probe is placed into the back of your mouth. You will be asked to swallow. This helps to pass the probe into your esophagus.  Once the tip of the probe is in the right place, your doctor can take pictures of your heart.  You may feel pressure at the back of your throat. What happens after the procedure?  You will be taken to a recovery area so the sedative can wear off.  Your throat may be sore and scratchy. This will go away slowly over time.  You will go home when you are fully awake and able to swallow liquids.  You should have someone stay with you for the next 24 hours.  Do not drive or operate machinery for the next 24 hours. This information is not intended to replace advice given to you by your health care provider. Make sure you discuss any questions you have with your health care provider. Document Released: 01/12/2009 Document Revised: 08/23/2015 Document Reviewed: 09/16/2012 Elsevier Interactive Patient Education  2017 [place] An angiogram is an X-ray test. It is used to look at your blood vessels. For this test, a dye is put into the blood vessel being checked. The dye shows up on X-rays. It helps your doctor see if  there is a blockage or other problem in the blood vessel. What happens before the procedure?  Follow your doctor's instructions about limiting what you eat or drink.  Ask your doctor if you may drink enough water to take any needed medicines the morning of the test.  Plan to have someone take you home after the test.  If you go home the same day as the test, plan to have someone stay with you for 24 hours. What happens during the procedure?  An IV tube will be put into one of your veins.  You will be given a medicine that makes you relax (sedative).  Your skin will be washed where the thin tube (catheter) will be put in. Hair may be removed from this area. The tube may be put into:  Your upper leg area (groin).  The fold of your arm, near your elbow.  Your wrist.  You will be given a medicine that numbs the area where the tube will be inserted (local anesthetic).  The tube will be inserted into a blood vessel.  Using a  type of X-ray (fluoroscopy) to see, your doctor will move the tube into the blood vessel to check it.  Dye will be put in through the tube. X-rays of your blood vessels will then be taken. Different doctors and hospitals may do this procedure differently. What happens after the procedure?  If the test is done through the leg, you will be kept in bed lying flat for several hours. You will be told to not bend or cross your legs.  The area where the tube was inserted will be checked often.  The pulse in your feet or wrist will be checked often.  More tests or X-rays may be done. This information is not intended to replace advice given to you by your health care provider. Make sure you discuss any questions you have with your health care provider. Document Released: 06/13/2008 Document Revised: 08/23/2015 Document Reviewed: 08/18/2012 Elsevier Interactive Patient Education  2017 [place] After Refer to this sheet in the next few weeks.  These instructions provide you with information about caring for yourself after your procedure. Your health care provider may also give you more specific instructions. Your treatment has been planned according to current medical practices, but problems sometimes occur. Call your health care provider if you have any problems or questions after your procedure. What can I expect after the procedure? After your procedure, it is typical to have the following:  Bruising at the catheter insertion site that usually fades within 1-2 weeks.  Blood collecting in the tissue (hematoma) that may be painful to the touch. It should usually decrease in size and tenderness within 1-2 weeks. Follow these instructions at home:  Take medicines only as directed by your health care provider.  You may shower 24-48 hours after the procedure or as directed by your health care provider. Remove the bandage (dressing) and gently wash the site with plain soap and water. Pat the area dry with a clean towel. Do not rub the site, because this may cause bleeding.  Do not take baths, swim, or use a hot tub until your health care provider approves.  Check your insertion site every day for redness, swelling, or drainage.  Do not apply powder or lotion to the site.  Do not lift over 10 lb (4.5 kg) for 5 days after your procedure or as directed by your health care provider.  Ask your health care provider when it is okay to:  Return to work or school.  Resume usual physical activities or sports.  Resume sexual activity.  Do not drive home if you are discharged the same day as the procedure. Have someone else drive you.  You may drive 24 hours after the procedure unless otherwise instructed by your health care provider.  Do not operate machinery or power tools for 24 hours after the procedure or as directed by your health care provider.  If your procedure was done as an outpatient procedure, which means that you went home  the same day as your procedure, a responsible adult should be with you for the first 24 hours after you arrive home.  Keep all follow-up visits as directed by your health care provider. This is important. Contact a health care provider if:  You have a fever.  You have chills.  You have increased bleeding from the catheter insertion site. Hold pressure on the site. Get help right away if:  You have unusual pain at the catheter insertion site.  You have redness, warmth, or  swelling at the catheter insertion site.  You have drainage (other than a small amount of blood on the dressing) from the catheter insertion site.  The catheter insertion site is bleeding, and the bleeding does not stop after 30 minutes of holding steady pressure on the site.  The area near or just beyond the catheter insertion site becomes pale, cool, tingly, or numb. This information is not intended to replace advice given to you by your health care provider. Make sure you discuss any questions you have with your health care provider. Document Released: 10/03/2004 Document Revised: 08/23/2015 Document Reviewed: 08/18/2012 Elsevier Interactive Patient Education  2017 [employer].

## 2016-05-14 LAB — BASIC METABOLIC PANEL
BUN / CREAT RATIO: 21 (ref 12–28)
BUN: 18 mg/dL (ref 8–27)
CHLORIDE: 99 mmol/L (ref 96–106)
CO2: 24 mmol/L (ref 18–29)
CREATININE: 0.85 mg/dL (ref 0.57–1.00)
Calcium: 8.3 mg/dL — ABNORMAL LOW (ref 8.7–10.3)
GFR calc Af Amer: 86 mL/min/{1.73_m2} (ref >59)
GFR calc non Af Amer: 75 mL/min/{1.73_m2} (ref >59)
GLUCOSE: 93 mg/dL (ref 65–99)
Potassium: 4.4 mmol/L (ref 3.5–5.2)
SODIUM: 143 mmol/L (ref 134–144)

## 2016-05-14 LAB — CBC
Hematocrit: 36.5 % (ref 34.0–46.6)
Hemoglobin: 11.7 g/dL (ref 11.1–15.9)
MCH: 28.5 pg (ref 26.6–33.0)
MCHC: 32.1 g/dL (ref 31.5–35.7)
MCV: 89 fL (ref 79–97)
PLATELETS: 190 10*3/uL (ref 150–379)
RBC: 4.1 x10E6/uL (ref 3.77–5.28)
RDW: 14.4 % (ref 12.3–15.4)
WBC: 7.4 10*3/uL (ref 3.4–10.8)

## 2016-05-14 LAB — PROTIME-INR
INR: 1 (ref 0.8–1.2)
Prothrombin Time: 10.7 s (ref 9.1–12.0)

## 2016-05-16 ENCOUNTER — Ambulatory Visit (INDEPENDENT_AMBULATORY_CARE_PROVIDER_SITE_OTHER)
Admission: EM | Admit: 2016-05-16 | Discharge: 2016-05-16 | Disposition: A | Discharge status: Home / Self Care | Payer: Medicare Other | Source: Home / Self Care | Attending: Family Medicine | Admitting: Family Medicine

## 2016-05-16 ENCOUNTER — Telehealth: Payer: Self-pay | Admitting: Cardiovascular Disease

## 2016-05-16 ENCOUNTER — Encounter: Payer: Self-pay | Admitting: Emergency Medicine

## 2016-05-16 DIAGNOSIS — J441 Chronic obstructive pulmonary disease with (acute) exacerbation: Secondary | ICD-10-CM

## 2016-05-16 MED ORDER — DOXYCYCLINE HYCLATE 100 MG PO CAPS: 100.0000 mg | ORAL_CAPSULE | Freq: Two times a day (BID) | ORAL | 0 refills | Status: DC

## 2016-05-16 MED ORDER — ALBUTEROL SULFATE (2.5 MG/3ML) 0.083% IN NEBU
2.5000 mg | INHALATION_SOLUTION | Freq: Once | RESPIRATORY_TRACT | Status: AC
  Administered 2016-05-16: 2.5 mg via RESPIRATORY_TRACT

## 2016-05-16 MED ORDER — METHYLPREDNISOLONE SODIUM SUCC 125 MG IJ SOLR
125.0000 mg | Freq: Once | INTRAMUSCULAR | Status: AC
  Administered 2016-05-16: 125 mg via INTRAMUSCULAR

## 2016-05-16 NOTE — ED Triage Notes (Signed)
Patient c/o cough and chest congestion that started this morning.  Patient denies fevers.

## 2016-05-16 NOTE — Telephone Encounter (Signed)
Reviewed TEE instructions and lab results w/pt who verbalized understanding. She is agreeable to arrive Feb 19, 6:30am @ the [facility] entrance. Pt had no questions at this time and states she is feeling a bit better about the TEE as she is not quite as anxious.

## 2016-05-16 NOTE — Discharge Instructions (Signed)
Take medication as prescribed. Rest. Drink plenty of fluids.  ° °Follow up with your primary care physician this week. Return to Urgent care or Emergency room for new or worsening concerns.  ° °

## 2016-05-16 NOTE — ED Provider Notes (Signed)
[facility] ____________________________________________  Time seen: Approximately 3:22 PM  I have reviewed the triage vital signs and the nursing notes.   HISTORY  Chief Complaint Cough   HPI Lauren Bartlett is a 61 y.o. female presenting for complaints of 2 days of cough, increased today. Reports occasional nasal congestion. States cough is primarily a dry nonproductive hacking cough. Patient reports some intermittent accompanying wheezing. Denies chest pain or shortness of breath. Patient reports feels consistent with previous COPD exacerbations. Denies fevers. Reports continues to eat and drink well. Reports last use for home albuterol inhaler at approximately 10 AM which did help with her symptoms.  Denies chest pain, shortness breath, abdominal pain, extremity pain, atypical extremity swelling. Denies recent immobilization. Patient reports last COPD exacerbation was approximately one month ago and she was treated well with IM steroids, as she does not tolerate oral prednisone. Patient also reports she is scheduled for transesophageal echocardiogram and cardiac catheterization next Thursday.  [name], MD: PCP   Past Medical History:  Diagnosis Date  . Anxiety   . Congenital absence of one kidney   . COPD (chronic obstructive pulmonary disease) ([place])   . Depression    treated at [place] [facility]  . Emphysema/COPD ([place])   . History of sciatica   . Hypertension   . hypothyrodism   . Hypothyroidism   . Motion sickness    cars  . MVP (mitral valve prolapse)   . PONV (postoperative nausea and vomiting)   . Pulmonary hypertension   . Sleep apnea    O2 at night and PRN    Patient Active Problem List   Diagnosis Date Noted  . Sinusitis, acute maxillary 04/13/2016  . 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  . Constipation 08/05/2015  . Hospital discharge follow-up 08/05/2015  . Chronic respiratory failure with hypoxia ([place])   . Encounter for preventive health examination  03/14/2015  . Breast cancer screening 03/14/2015  . Need for hepatitis C screening test 03/14/2015  . Vitamin D deficiency 01/11/2015  . S/P hysterectomy with oophorectomy 09/22/2013  . Pulmonary hypertension ([place]) 07/14/2013  . Hypothyroidism 05/04/2012  . Severe mitral regurgitation by prior echocardiogram 01/11/2012  . Obesity (BMI 30-39.9) 08/04/2011  . Depression   . Hypertension   . Congenital absence of one kidney   . History of sciatica     Past Surgical History:  Procedure Laterality Date  . ABDOMINAL HYSTERECTOMY    . BREAST BIOPSY Right 2011   [facility]< benign  . CATARACT EXTRACTION W/PHACO Left 11/14/2015   Procedure: CATARACT EXTRACTION PHACO AND INTRAOCULAR LENS PLACEMENT (IOC);  Surgeon: Leandrew Koyanagi, MD;  Location: [facility];  Service: Ophthalmology;  Laterality: Left;  sleep apnea [place]  . CATARACT EXTRACTION W/PHACO Right 12/19/2015   Procedure: CATARACT EXTRACTION PHACO AND INTRAOCULAR LENS PLACEMENT (IOC);  Surgeon: Leandrew Koyanagi, MD;  Location: [facility];  Service: Ophthalmology;  Laterality: Right;  ANXIETY GENEROUS IV SEDATION TORIC LEN  . COMBINED HYSTERECTOMY ABDOMINAL W/ A&P REPAIR / OOPHORECTOMY  1996   benign tumor  . INNER EAR SURGERY     bilateral      Current Facility-Administered Medications:  .  methylPREDNISolone acetate (DEPO-MEDROL) injection (RADIOLOGY ONLY) 80 mg, 80 mg, Intramuscular, Once, Burnard Hawthorne, FNP  Current Outpatient Prescriptions:  .  albuterol (PROVENTIL HFA;VENTOLIN HFA) 108 (90 Base) MCG/ACT inhaler, Inhale 2 puffs into the lungs every 6 (six) hours as needed for wheezing or shortness of breath. Pt uses with SPACER., Disp: , Rfl:  .  albuterol (  PROVENTIL) (2.5 MG/3ML) 0.083% nebulizer solution, Take 2.5 mg by nebulization every 6 (six) hours as needed for wheezing or shortness of breath., Disp: , Rfl:  .  doxycycline (VIBRAMYCIN) 100 MG capsule, Take 1 capsule (100 mg total) by mouth 2 (two)  times daily., Disp: 20 capsule, Rfl: 0 .  fluticasone (FLONASE) 50 MCG/ACT nasal spray, Place 2 sprays into both nostrils daily., Disp: , Rfl:  .  fluticasone (FLOVENT HFA) 110 MCG/ACT inhaler, Inhale 1 puff into the lungs 2 (two) times daily., Disp: , Rfl:  .  hydrochlorothiazide (HYDRODIURIL) 25 MG tablet, TAKE 1/2 TABLET(12.5 MG) BY MOUTH DAILY, Disp: 45 tablet, Rfl: 1 .  hydroxypropyl methylcellulose / hypromellose (ISOPTO TEARS / GONIOVISC) 2.5 % ophthalmic solution, Place 1 drop into both eyes 2 (two) times daily., Disp: , Rfl:  .  levothyroxine (SYNTHROID, LEVOTHROID) 112 MCG tablet, Take 112 mcg by mouth daily before breakfast., Disp: , Rfl:  .  loratadine-pseudoephedrine (CLARITIN-D 24-HOUR) 10-240 MG 24 hr tablet, Take 1 tablet by mouth daily., Disp: , Rfl:  .  LORazepam (ATIVAN) 0.5 MG tablet, Take 1 tablet (0.5 mg total) by mouth every 8 (eight) hours as needed for anxiety., Disp: 30 tablet, Rfl: 0 .  losartan (COZAAR) 100 MG tablet, TAKE 1 TABLET(100 MG) BY MOUTH DAILY, Disp: 30 tablet, Rfl: 0 .  montelukast (SINGULAIR) 10 MG tablet, Take 1 tablet (10 mg total) by mouth at bedtime., Disp: 30 tablet, Rfl: 3 .  OXYGEN, Inhale 2 L into the lungs at bedtime as needed (at bedtime and as needed)., Disp: , Rfl:  .  polyethylene glycol powder (GLYCOLAX/MIRALAX) powder, Take 17 g by mouth 2 (two) times daily as needed., Disp: 3350 g, Rfl: 1 .  traMADol (ULTRAM) 50 MG tablet, 1-2 tabs po q 6 hr prn pain Maximum dose= 8 tablets per day, Disp: 20 tablet, Rfl: 0 .  umeclidinium-vilanterol (ANORO ELLIPTA) 62.5-25 MCG/INH AEPB, Inhale 1 puff into the lungs daily., Disp: 1 each, Rfl: 11  Allergies Codeine; Penicillins; Zithromax [azithromycin]; and Prednisone  Family History  Problem Relation Age of Onset  . Multiple sclerosis Mother   . Coronary artery disease Father   . Heart disease Brother   . Breast cancer Paternal Aunt     Social History Social History  Substance Use Topics  . Smoking  status: Never Smoker  . Smokeless tobacco: Never Used  . Alcohol use No    Review of Systems Constitutional: No fever/chills Eyes: No visual changes. ENT: No sore throat. Cardiovascular: Denies chest pain. Respiratory: Denies shortness of breath. Gastrointestinal: No abdominal pain.  No nausea, no vomiting.  No diarrhea.  No constipation. Genitourinary: Negative for dysuria. Skin: Negative for rash. Neurological: Negative for focal weakness or numbness.  10-point ROS otherwise negative.  ____________________________________________   PHYSICAL EXAM:  VITAL SIGNS: ED Triage Vitals  Enc Vitals Group     BP 05/16/16 1452 126/78     Pulse Rate 05/16/16 1452 87     Resp 05/16/16 1452 16     Temp 05/16/16 1452 98.1 F (36.7 C)     Temp Source 05/16/16 1452 Oral     SpO2 05/16/16 1452 97 %     Weight 05/16/16 1450 219 lb (99.3 kg)     Height 05/16/16 1450 5\' 2"  (1.575 m)     Head Circumference --      Peak Flow --      Pain Score 05/16/16 1451 0     Pain Loc --  Pain Edu? --      Excl. in [place]? --    Constitutional: Alert and oriented. Well appearing and in no acute distress. Eyes: Conjunctivae are normal. PERRL. EOMI. Head: Atraumatic. No sinus tenderness to palpation. No swelling. No erythema.  Ears: no erythema, normal TMs bilaterally.   Nose: Mild nasal congestion.  Mouth/Throat: Mucous membranes are moist. No pharyngeal erythema. No tonsillar swelling or exudate.  Neck: No stridor.  No cervical spine tenderness to palpation. Hematological/Lymphatic/Immunilogical: No cervical lymphadenopathy. Cardiovascular: Normal rate, regular rhythm. Systolic murmur.  Good peripheral circulation. Respiratory: Normal respiratory effort. Scattered wheezes throughout. Mild scattered rhonchi. Dry intermittent cough noted in room. Speaks in complete sentences. No retractions. Gastrointestinal: Soft and nontender. No CVA tenderness. Musculoskeletal: Ambulatory with steady gait. No  cervical, thoracic or lumbar tenderness to palpation. Bilateral lower extremities nontender to palpation. Neurologic:  Normal speech and language. No gait instability. Skin:  Skin appears warm, dry and intact. No rash noted. Psychiatric: Mood and affect are normal. Speech and behavior are normal. ___________________________________________   LABS (all labs ordered are listed, but only abnormal results are displayed)  Labs Reviewed - No data to display  PROCEDURES Procedures    INITIAL IMPRESSION / ASSESSMENT AND PLAN / ED COURSE  Pertinent labs & imaging results that were available during my care of the patient were reviewed by me and considered in my medical decision making (see chart for details).  Well-appearing patient. No acute distress. Suspect COPD exacerbation. No clear focal area of consolidation auscultated. Will treat patient with 125 mg IM Solu-Medrol once in urgent care and will treat patient with oral doxycycline. Albuterol neb given once in urgent care, patient reports feeling better after albuterol neb. Continue home albuterol treatments. Encourage rest, fluids and supportive care. Follow-up with primary care physician.Discussed indication, risks and benefits of medications with patient. Discussed strict follow-up and return parameters  Discussed follow up with Primary care physician this week. Discussed follow up and return parameters including no resolution or any worsening concerns. Patient verbalized understanding and agreed to plan.   ____________________________________________   FINAL CLINICAL IMPRESSION(S) / ED DIAGNOSES  Final diagnoses:  COPD exacerbation ([place])     Discharge Medication List as of 05/16/2016  4:03 PM    START taking these medications   Details  doxycycline (VIBRAMYCIN) 100 MG capsule Take 1 capsule (100 mg total) by mouth 2 (two) times daily., Starting Fri 05/16/2016, Normal        Note: This dictation was prepared with Dragon  dictation along with smaller phrase technology. Any transcriptional errors that result from this process are unintentional.         Marylene Land, NP 05/16/16 [phone]

## 2016-05-19 ENCOUNTER — Encounter: Payer: Self-pay | Admitting: *Deleted

## 2016-05-19 ENCOUNTER — Ambulatory Visit: Payer: Medicare Other | Admitting: Certified Registered"

## 2016-05-19 ENCOUNTER — Inpatient Hospital Stay: Payer: Medicare Other

## 2016-05-19 ENCOUNTER — Encounter
Admission: AD | Disposition: A | Discharge status: Home Health | Payer: Self-pay | Source: Ambulatory Visit | Attending: Internal Medicine

## 2016-05-19 ENCOUNTER — Inpatient Hospital Stay
Admission: AD | Admit: 2016-05-19 | Discharge: 2016-05-23 | DRG: 190 | Disposition: A | Discharge status: Home Health | Payer: Medicare Other | Source: Ambulatory Visit | Attending: Internal Medicine | Admitting: Internal Medicine

## 2016-05-19 DIAGNOSIS — R0902 Hypoxemia: Secondary | ICD-10-CM

## 2016-05-19 DIAGNOSIS — Z6841 Body Mass Index (BMI) 40.0 and over, adult: Secondary | ICD-10-CM

## 2016-05-19 DIAGNOSIS — J449 Chronic obstructive pulmonary disease, unspecified: Secondary | ICD-10-CM | POA: Diagnosis not present

## 2016-05-19 DIAGNOSIS — I10 Essential (primary) hypertension: Secondary | ICD-10-CM | POA: Diagnosis not present

## 2016-05-19 DIAGNOSIS — I110 Hypertensive heart disease with heart failure: Secondary | ICD-10-CM | POA: Diagnosis present

## 2016-05-19 DIAGNOSIS — R000 Tachycardia, unspecified: Secondary | ICD-10-CM | POA: Diagnosis not present

## 2016-05-19 DIAGNOSIS — J1001 Influenza due to other identified influenza virus with the same other identified influenza virus pneumonia: Secondary | ICD-10-CM | POA: Diagnosis present

## 2016-05-19 DIAGNOSIS — Z803 Family history of malignant neoplasm of breast: Secondary | ICD-10-CM | POA: Diagnosis not present

## 2016-05-19 DIAGNOSIS — Z8249 Family history of ischemic heart disease and other diseases of the circulatory system: Secondary | ICD-10-CM | POA: Diagnosis not present

## 2016-05-19 DIAGNOSIS — F419 Anxiety disorder, unspecified: Secondary | ICD-10-CM | POA: Diagnosis present

## 2016-05-19 DIAGNOSIS — I272 Pulmonary hypertension, unspecified: Secondary | ICD-10-CM | POA: Diagnosis present

## 2016-05-19 DIAGNOSIS — I340 Nonrheumatic mitral (valve) insufficiency: Secondary | ICD-10-CM | POA: Diagnosis not present

## 2016-05-19 DIAGNOSIS — I5031 Acute diastolic (congestive) heart failure: Secondary | ICD-10-CM | POA: Diagnosis not present

## 2016-05-19 DIAGNOSIS — J9811 Atelectasis: Secondary | ICD-10-CM | POA: Diagnosis not present

## 2016-05-19 DIAGNOSIS — G4730 Sleep apnea, unspecified: Secondary | ICD-10-CM | POA: Diagnosis not present

## 2016-05-19 DIAGNOSIS — Z9981 Dependence on supplemental oxygen: Secondary | ICD-10-CM

## 2016-05-19 DIAGNOSIS — I341 Nonrheumatic mitral (valve) prolapse: Secondary | ICD-10-CM | POA: Diagnosis present

## 2016-05-19 DIAGNOSIS — J101 Influenza due to other identified influenza virus with other respiratory manifestations: Secondary | ICD-10-CM

## 2016-05-19 DIAGNOSIS — J9621 Acute and chronic respiratory failure with hypoxia: Secondary | ICD-10-CM | POA: Diagnosis not present

## 2016-05-19 DIAGNOSIS — E039 Hypothyroidism, unspecified: Secondary | ICD-10-CM | POA: Diagnosis not present

## 2016-05-19 DIAGNOSIS — J441 Chronic obstructive pulmonary disease with (acute) exacerbation: Principal | ICD-10-CM

## 2016-05-19 DIAGNOSIS — R0602 Shortness of breath: Secondary | ICD-10-CM | POA: Diagnosis not present

## 2016-05-19 HISTORY — PX: TEE WITHOUT CARDIOVERSION: SHX5443

## 2016-05-19 LAB — BASIC METABOLIC PANEL
ANION GAP: 7 (ref 5–15)
BUN: 18 mg/dL (ref 6–20)
CALCIUM: 7.2 mg/dL — AB (ref 8.9–10.3)
CHLORIDE: 104 mmol/L (ref 101–111)
CO2: 28 mmol/L (ref 22–32)
CREATININE: 0.97 mg/dL (ref 0.44–1.00)
GFR calc non Af Amer: >60 mL/min (ref >60)
Glucose, Bld: 116 mg/dL — ABNORMAL HIGH (ref 65–99)
Potassium: 3.9 mmol/L (ref 3.5–5.1)
SODIUM: 139 mmol/L (ref 135–145)

## 2016-05-19 LAB — CBC
HEMATOCRIT: 34 % — AB (ref 35.0–47.0)
HEMOGLOBIN: 11.4 g/dL — AB (ref 12.0–16.0)
MCH: 30.1 pg (ref 26.0–34.0)
MCHC: 33.6 g/dL (ref 32.0–36.0)
MCV: 89.5 fL (ref 80.0–100.0)
Platelets: 140 10*3/uL — ABNORMAL LOW (ref 150–440)
RBC: 3.79 MIL/uL — ABNORMAL LOW (ref 3.80–5.20)
RDW: 14.4 % (ref 11.5–14.5)
WBC: 5 10*3/uL (ref 3.6–11.0)

## 2016-05-19 LAB — INFLUENZA PANEL BY PCR (TYPE A & B)
INFLAPCR: NEGATIVE
Influenza B By PCR: POSITIVE — AB

## 2016-05-19 LAB — PROCALCITONIN

## 2016-05-19 SURGERY — ECHOCARDIOGRAM, TRANSESOPHAGEAL
Anesthesia: General

## 2016-05-19 MED ORDER — MIDAZOLAM HCL 5 MG/5ML IJ SOLN
INTRAMUSCULAR | Status: AC
  Filled 2016-05-19: qty 5

## 2016-05-19 MED ORDER — OSELTAMIVIR PHOSPHATE 75 MG PO CAPS
75.0000 mg | ORAL_CAPSULE | Freq: Two times a day (BID) | ORAL | Status: DC
  Administered 2016-05-19 – 2016-05-23 (×8): 75 mg via ORAL
  Filled 2016-05-19 (×8): qty 1

## 2016-05-19 MED ORDER — HEPARIN SODIUM (PORCINE) 5000 UNIT/ML IJ SOLN
5000.0000 [IU] | Freq: Three times a day (TID) | INTRAMUSCULAR | Status: DC
  Administered 2016-05-19 – 2016-05-23 (×12): 5000 [IU] via SUBCUTANEOUS
  Filled 2016-05-19 (×12): qty 1

## 2016-05-19 MED ORDER — TRAMADOL HCL 50 MG PO TABS: 50.0000 mg | ORAL_TABLET | Freq: Two times a day (BID) | ORAL | Status: DC | PRN

## 2016-05-19 MED ORDER — LEVOFLOXACIN IN D5W 250 MG/50ML IV SOLN
250.0000 mg | INTRAVENOUS | Status: DC
  Administered 2016-05-19 – 2016-05-20 (×2): 250 mg via INTRAVENOUS
  Filled 2016-05-19 (×2): qty 50

## 2016-05-19 MED ORDER — BUDESONIDE 0.25 MG/2ML IN SUSP
0.2500 mg | Freq: Two times a day (BID) | RESPIRATORY_TRACT | Status: DC
  Administered 2016-05-19 – 2016-05-22 (×5): 0.25 mg via RESPIRATORY_TRACT
  Filled 2016-05-19 (×7): qty 2

## 2016-05-19 MED ORDER — PSEUDOEPHEDRINE HCL ER 120 MG PO TB12
120.0000 mg | ORAL_TABLET | Freq: Two times a day (BID) | ORAL | Status: DC
  Administered 2016-05-19 – 2016-05-23 (×9): 120 mg via ORAL
  Filled 2016-05-19 (×9): qty 1

## 2016-05-19 MED ORDER — LIDOCAINE VISCOUS 2 % MT SOLN
OROMUCOSAL | Status: AC
  Filled 2016-05-19: qty 15

## 2016-05-19 MED ORDER — TIOTROPIUM BROMIDE MONOHYDRATE 18 MCG IN CAPS
18.0000 ug | ORAL_CAPSULE | Freq: Every day | RESPIRATORY_TRACT | Status: DC
  Administered 2016-05-19 – 2016-05-22 (×4): 18 ug via RESPIRATORY_TRACT
  Filled 2016-05-19: qty 5

## 2016-05-19 MED ORDER — LEVOTHYROXINE SODIUM 112 MCG PO TABS
112.0000 ug | ORAL_TABLET | Freq: Every day | ORAL | Status: DC
  Administered 2016-05-19 – 2016-05-23 (×5): 112 ug via ORAL
  Filled 2016-05-19 (×5): qty 1

## 2016-05-19 MED ORDER — BUTAMBEN-TETRACAINE-BENZOCAINE 2-2-14 % EX AERO
INHALATION_SPRAY | CUTANEOUS | Status: AC
  Filled 2016-05-19: qty 20

## 2016-05-19 MED ORDER — METHYLPREDNISOLONE SODIUM SUCC 125 MG IJ SOLR
60.0000 mg | Freq: Four times a day (QID) | INTRAMUSCULAR | Status: DC
  Administered 2016-05-19 – 2016-05-20 (×5): 60 mg via INTRAVENOUS
  Filled 2016-05-19 (×5): qty 2

## 2016-05-19 MED ORDER — HYDROCHLOROTHIAZIDE 25 MG PO TABS
25.0000 mg | ORAL_TABLET | Freq: Every day | ORAL | Status: DC
  Filled 2016-05-19 (×3): qty 1

## 2016-05-19 MED ORDER — LEVOFLOXACIN 500 MG PO TABS
500.0000 mg | ORAL_TABLET | Freq: Every day | ORAL | Status: DC
  Filled 2016-05-19: qty 1

## 2016-05-19 MED ORDER — POLYETHYLENE GLYCOL 3350 17 G PO PACK
17.0000 g | PACK | Freq: Two times a day (BID) | ORAL | Status: DC | PRN
  Administered 2016-05-22: 17 g via ORAL
  Filled 2016-05-19: qty 1

## 2016-05-19 MED ORDER — SODIUM CHLORIDE 0.9 % IV SOLN
INTRAVENOUS | Status: DC
  Administered 2016-05-19 – 2016-05-21 (×5): via INTRAVENOUS

## 2016-05-19 MED ORDER — DOCUSATE SODIUM 100 MG PO CAPS
100.0000 mg | ORAL_CAPSULE | Freq: Two times a day (BID) | ORAL | Status: DC | PRN
  Administered 2016-05-22: 100 mg via ORAL
  Filled 2016-05-19: qty 1

## 2016-05-19 MED ORDER — POLYVINYL ALCOHOL 1.4 % OP SOLN
1.0000 [drp] | Freq: Two times a day (BID) | OPHTHALMIC | Status: DC
  Administered 2016-05-19 – 2016-05-23 (×9): 1 [drp] via OPHTHALMIC
  Filled 2016-05-19: qty 15

## 2016-05-19 MED ORDER — IPRATROPIUM-ALBUTEROL 0.5-2.5 (3) MG/3ML IN SOLN
3.0000 mL | RESPIRATORY_TRACT | Status: DC
  Administered 2016-05-19 – 2016-05-23 (×21): 3 mL via RESPIRATORY_TRACT
  Filled 2016-05-19 (×25): qty 3

## 2016-05-19 MED ORDER — UMECLIDINIUM-VILANTEROL 62.5-25 MCG/INH IN AEPB
1.0000 | INHALATION_SPRAY | Freq: Every day | RESPIRATORY_TRACT | Status: DC
  Administered 2016-05-19 – 2016-05-22 (×4): 1 via RESPIRATORY_TRACT
  Filled 2016-05-19: qty 14

## 2016-05-19 MED ORDER — LORATADINE 10 MG PO TABS
10.0000 mg | ORAL_TABLET | Freq: Every day | ORAL | Status: DC
  Administered 2016-05-19 – 2016-05-23 (×5): 10 mg via ORAL
  Filled 2016-05-19 (×5): qty 1

## 2016-05-19 MED ORDER — ONDANSETRON HCL 4 MG/2ML IJ SOLN
4.0000 mg | Freq: Three times a day (TID) | INTRAMUSCULAR | Status: DC | PRN
  Administered 2016-05-20 – 2016-05-21 (×4): 4 mg via INTRAVENOUS
  Filled 2016-05-19 (×4): qty 2

## 2016-05-19 MED ORDER — LORAZEPAM 0.5 MG PO TABS: 0.5000 mg | ORAL_TABLET | Freq: Three times a day (TID) | ORAL | Status: DC | PRN

## 2016-05-19 MED ORDER — FENTANYL CITRATE (PF) 100 MCG/2ML IJ SOLN
INTRAMUSCULAR | Status: AC
  Filled 2016-05-19: qty 2

## 2016-05-19 MED ORDER — MIDAZOLAM HCL 2 MG/2ML IJ SOLN
INTRAMUSCULAR | Status: AC
  Filled 2016-05-19: qty 2

## 2016-05-19 MED ORDER — HYPROMELLOSE (GONIOSCOPIC) 2.5 % OP SOLN
1.0000 [drp] | Freq: Two times a day (BID) | OPHTHALMIC | Status: DC
  Filled 2016-05-19: qty 15

## 2016-05-19 MED ORDER — LORATADINE-PSEUDOEPHEDRINE ER 10-240 MG PO TB24: 1.0000 | ORAL_TABLET | Freq: Every day | ORAL | Status: DC

## 2016-05-19 MED ORDER — SODIUM CHLORIDE 0.9% FLUSH
3.0000 mL | Freq: Two times a day (BID) | INTRAVENOUS | Status: DC
  Administered 2016-05-19 – 2016-05-23 (×7): 3 mL via INTRAVENOUS

## 2016-05-19 MED ORDER — LOSARTAN POTASSIUM 50 MG PO TABS
50.0000 mg | ORAL_TABLET | Freq: Every day | ORAL | Status: DC
  Administered 2016-05-20 – 2016-05-23 (×4): 50 mg via ORAL
  Filled 2016-05-19 (×5): qty 1

## 2016-05-19 MED ORDER — MONTELUKAST SODIUM 10 MG PO TABS
10.0000 mg | ORAL_TABLET | Freq: Every day | ORAL | Status: DC
  Administered 2016-05-19 – 2016-05-22 (×4): 10 mg via ORAL
  Filled 2016-05-19 (×4): qty 1

## 2016-05-19 MED ORDER — FLUTICASONE PROPIONATE 50 MCG/ACT NA SUSP
2.0000 | Freq: Every day | NASAL | Status: DC
  Administered 2016-05-19 – 2016-05-23 (×5): 2 via NASAL
  Filled 2016-05-19: qty 16

## 2016-05-19 NOTE — Progress Notes (Signed)
Okay to place order to swab patient for the flu as she is now running a fever

## 2016-05-19 NOTE — Progress Notes (Signed)
Called Dr. Ara Kussmaul regarding nausea medication.  Appropriate orders were placed.  Christene Slates  05/19/2016  11:56 PM

## 2016-05-19 NOTE — Anesthesia Preprocedure Evaluation (Signed)
Anesthesia Evaluation  Patient identified by MRN, date of birth, ID band Patient awake    Reviewed: Allergy & Precautions, H&P , NPO status   History of Anesthesia Complications (+) PONV and history of anesthetic complications  Airway Mallampati: II  TM Distance: >3 FB Neck ROM: full    Dental   Pulmonary sleep apnea , COPD,    + rhonchi        Cardiovascular hypertension, Pt. on medications Normal cardiovascular exam+ Valvular Problems/Murmurs MVP      Neuro/Psych PSYCHIATRIC DISORDERS Anxiety Depression sciatica    GI/Hepatic   Endo/Other  Hypothyroidism   Renal/GU      Musculoskeletal   Abdominal Normal abdominal exam  (+)   Peds  Hematology   Anesthesia Other Findings   Reproductive/Obstetrics                             Anesthesia Physical  Anesthesia Plan  ASA: III  Anesthesia Plan: General   Post-op Pain Management:    Induction: Inhalational  Airway Management Planned: Nasal Cannula  Additional Equipment:   Intra-op Plan:   Post-operative Plan:   Informed Consent: I have reviewed the patients History and Physical, chart, labs and discussed the procedure including the risks, benefits and alternatives for the proposed anesthesia with the patient or authorized representative who has indicated his/her understanding and acceptance.     Plan Discussed with: CRNA  Anesthesia Plan Comments:         Anesthesia Quick Evaluation

## 2016-05-19 NOTE — H&P (Signed)
[facility] NAME: Lauren Bartlett    MR#:  [id number]  DATE OF BIRTH:  [birth year]  DATE OF ADMISSION:  05/19/2016  PRIMARY CARE PHYSICIAN: [name], MD   REQUESTING/REFERRING PHYSICIAN: Dr.Arida  CHIEF COMPLAINT:  SOB, COPD  HISTORY OF PRESENT ILLNESS: Lauren Bartlett  is a 61 y.o. female with a known history of Htn, hypothyroidism, MVP, COPD- was havign cough and SOB for few days , so went to urgent care center- was given inj steroids and oral Doxy on Friday, taking ABx for last 3 days. Today was scheduled appointment with cardiologist for TEE as a work up before valvular replacement surgery.\ When she came, she had wheezing, so [name] suggested to admit to medicine and cancelled the procedure.   PAST MEDICAL HISTORY:   Past Medical History:  Diagnosis Date  . Anxiety   . Congenital absence of one kidney   . COPD (chronic obstructive pulmonary disease) ([place])   . Depression    treated at [place]  . Emphysema/COPD ([place])   . History of sciatica   . Hypertension   . hypothyrodism   . Hypothyroidism   . Motion sickness    cars  . MVP (mitral valve prolapse)   . PONV (postoperative nausea and vomiting)   . Pulmonary hypertension   . Sleep apnea    O2 at night and PRN    PAST SURGICAL HISTORY: Past Surgical History:  Procedure Laterality Date  . ABDOMINAL HYSTERECTOMY    . BREAST BIOPSY Right 2011   [facility]< benign  . CATARACT EXTRACTION W/PHACO Left 11/14/2015   Procedure: CATARACT EXTRACTION PHACO AND INTRAOCULAR LENS PLACEMENT (IOC);  Surgeon: Leandrew Koyanagi, MD;  Location: [place];  Service: Ophthalmology;  Laterality: Left;  sleep apnea Toric  . CATARACT EXTRACTION W/PHACO Right 12/19/2015   Procedure: CATARACT EXTRACTION PHACO AND INTRAOCULAR LENS PLACEMENT (IOC);  Surgeon: Leandrew Koyanagi, MD;  Location: [place];  Service: Ophthalmology;  Laterality: Right;  ANXIETY GENEROUS IV  SEDATION TORIC LEN  . COMBINED HYSTERECTOMY ABDOMINAL W/ A&P REPAIR / OOPHORECTOMY  1996   benign tumor  . INNER EAR SURGERY     bilateral    SOCIAL HISTORY:  Social History  Substance Use Topics  . Smoking status: Never Smoker  . Smokeless tobacco: Never Used  . Alcohol use No    FAMILY HISTORY:  Family History  Problem Relation Age of Onset  . Multiple sclerosis Mother   . Coronary artery disease Father   . Heart disease Brother   . Breast cancer Paternal Aunt     DRUG ALLERGIES:  Allergies  Allergen Reactions  . Codeine Hives and Nausea And Vomiting  . Penicillins Hives, Itching and Other (See Comments)    Has patient had a PCN reaction causing immediate rash, facial/tongue/throat swelling, SOB or lightheadedness with hypotension: No Has patient had a PCN reaction causing severe rash involving mucus membranes or skin necrosis: No Has patient had a PCN reaction that required hospitalization No Has patient had a PCN reaction occurring within the last 10 years: No If all of the above answers are "NO", then may proceed with Cephalosporin use.  [name] Zithromax [Azithromycin] Other (See Comments)    Reaction:  Hallucinations   . Prednisone Palpitations and Other (See Comments)    Reaction:  Hallucinations     REVIEW OF SYSTEMS:   CONSTITUTIONAL: No fever, fatigue or weakness.  EYES: No blurred or double vision.  EARS,  NOSE, AND THROAT: No tinnitus or ear pain.  RESPIRATORY: positive for cough, shortness of breath, wheezing, no hemoptysis.  CARDIOVASCULAR: No chest pain, orthopnea, edema.  GASTROINTESTINAL: No nausea, vomiting, diarrhea or abdominal pain.  GENITOURINARY: No dysuria, hematuria.  ENDOCRINE: No polyuria, nocturia,  HEMATOLOGY: No anemia, easy bruising or bleeding SKIN: No rash or lesion. MUSCULOSKELETAL: No joint pain or arthritis.   NEUROLOGIC: No tingling, numbness, weakness.  PSYCHIATRY: No anxiety or depression.   MEDICATIONS AT HOME:  Prior to  Admission medications   Medication Sig Start Date End Date Taking? Authorizing Provider  albuterol (PROVENTIL HFA;VENTOLIN HFA) 108 (90 Base) MCG/ACT inhaler Inhale 2 puffs into the lungs every 6 (six) hours as needed for wheezing or shortness of breath. Pt uses with SPACER.   Yes Historical Provider, MD  albuterol (PROVENTIL) (2.5 MG/3ML) 0.083% nebulizer solution Take 2.5 mg by nebulization every 6 (six) hours as needed for wheezing or shortness of breath.   Yes Historical Provider, MD  doxycycline (VIBRAMYCIN) 100 MG capsule Take 1 capsule (100 mg total) by mouth 2 (two) times daily. 05/16/16  Yes Marylene Land, NP  fluticasone (FLONASE) 50 MCG/ACT nasal spray Place 2 sprays into both nostrils daily.   Yes Historical Provider, MD  fluticasone (FLOVENT HFA) 110 MCG/ACT inhaler Inhale 1 puff into the lungs 2 (two) times daily.   Yes Historical Provider, MD  hydrochlorothiazide (HYDRODIURIL) 25 MG tablet TAKE 1/2 TABLET(12.5 MG) BY MOUTH DAILY 10/22/15  Yes [name], MD  hydroxypropyl methylcellulose / hypromellose (ISOPTO TEARS / GONIOVISC) 2.5 % ophthalmic solution Place 1 drop into both eyes 2 (two) times daily.   Yes Historical Provider, MD  levothyroxine (SYNTHROID, LEVOTHROID) 112 MCG tablet Take 112 mcg by mouth daily before breakfast.   Yes Historical Provider, MD  loratadine-pseudoephedrine (CLARITIN-D 24-HOUR) 10-240 MG 24 hr tablet Take 1 tablet by mouth daily.   Yes Historical Provider, MD  LORazepam (ATIVAN) 0.5 MG tablet Take 1 tablet (0.5 mg total) by mouth every 8 (eight) hours as needed for anxiety. 04/18/16 04/18/17 Yes Earleen Newport, MD  losartan (COZAAR) 100 MG tablet TAKE 1 TABLET(100 MG) BY MOUTH DAILY 04/28/16  Yes [name], MD  montelukast (SINGULAIR) 10 MG tablet Take 1 tablet (10 mg total) by mouth at bedtime. 06/05/15  Yes Fritzi Mandes, MD  OXYGEN Inhale 2 L into the lungs at bedtime as needed (at bedtime and as needed).   Yes Historical Provider, MD  polyethylene  glycol powder (GLYCOLAX/MIRALAX) powder Take 17 g by mouth 2 (two) times daily as needed. 08/03/15  Yes [name], MD  traMADol (ULTRAM) 50 MG tablet 1-2 tabs po q 6 hr prn pain Maximum dose= 8 tablets per day 11/09/15  Yes Melynda Ripple, MD  umeclidinium-vilanterol [facility] ELLIPTA) 62.5-25 MCG/INH AEPB Inhale 1 puff into the lungs daily. 04/28/16  Yes [name], MD      PHYSICAL EXAMINATION:   VITAL SIGNS: Blood pressure (!) 150/83, resp. rate 18, height 5\' 2"  (1.575 m), weight 99.3 kg (219 lb), SpO2 95 %.  GENERAL:  61 y.o.-year-old patient lying in the bed with no acute distress.  EYES: Pupils equal, round, reactive to light and accommodation. No scleral icterus. Extraocular muscles intact.  HEENT: Head atraumatic, normocephalic. Oropharynx and nasopharynx clear.  NECK:  Supple, no jugular venous distention. No thyroid enlargement, no tenderness.  LUNGS: Normal breath sounds bilaterally, some wheezing, no crepitation. No use of accessory muscles of respiration.  CARDIOVASCULAR: S1, S2 normal. Positive for murmurs.  ABDOMEN: Soft, nontender, nondistended. Bowel sounds present. No organomegaly or mass.  EXTREMITIES: No pedal edema, cyanosis, or clubbing.  NEUROLOGIC: Cranial nerves II through XII are intact. Muscle strength 5/5 in all extremities. Sensation intact. Gait not checked.  PSYCHIATRIC: The patient is alert and oriented x 3.  SKIN: No obvious rash, lesion, or ulcer.   LABORATORY PANEL:   CBC  Recent Labs Lab 05/13/16 1442  WBC 7.4  HCT 36.5  PLT 190  MCV 89  MCH 28.5  MCHC 32.1  RDW 14.4   ------------------------------------------------------------------------------------------------------------------  Chemistries   Recent Labs Lab 05/13/16 1442  NA 143  K 4.4  CL 99  CO2 24  GLUCOSE 93  BUN 18  CREATININE 0.85  CALCIUM 8.3*    ------------------------------------------------------------------------------------------------------------------ estimated creatinine clearance is 77.6 mL/min (by C-G formula based on SCr of 0.85 mg/dL). ------------------------------------------------------------------------------------------------------------------ No results for input(s): TSH, T4TOTAL, T3FREE, THYROIDAB in the last 72 hours.  Invalid input(s): FREET3   Coagulation profile  Recent Labs Lab 05/13/16 1442  INR 1.0   ------------------------------------------------------------------------------------------------------------------- No results for input(s): DDIMER in the last 72 hours. -------------------------------------------------------------------------------------------------------------------  Cardiac Enzymes No results for input(s): CKMB, TROPONINI, MYOGLOBIN in the last 168 hours.  Invalid input(s): CK ------------------------------------------------------------------------------------------------------------------ Invalid input(s): POCBNP  ---------------------------------------------------------------------------------------------------------------  Urinalysis    Component Value Date/Time   COLORURINE Straw 03/09/2013 1636   APPEARANCEUR Clear 03/09/2013 1636   LABSPEC 1.005 03/09/2013 1636   PHURINE 6.0 03/09/2013 1636   GLUCOSEU Negative 03/09/2013 1636   HGBUR 1+ 03/09/2013 1636   BILIRUBINUR Negative 03/09/2013 1636   KETONESUR Negative 03/09/2013 1636   PROTEINUR Negative 03/09/2013 1636   NITRITE Negative 03/09/2013 1636   LEUKOCYTESUR Negative 03/09/2013 1636     RADIOLOGY: No results found.  EKG: Orders placed or performed in visit on 05/13/16  . EKG 12-Lead    IMPRESSION AND PLAN:  * Ac COPD exacerbation   IV steroids, nebs, Oral levaquin.   Spiriva, Duoneb, and inhaled steroids.  * MVP   [place] per cardio, currently hold work ups due to COPD.   May re-schedule TEE as  out pt.  * hypothyroidism   Cont levothyroxine.  * Htn   Losartan, HCTZ.   All the records are reviewed and case discussed with ED provider. Management plans discussed with the patient, family and they are in agreement.  CODE STATUS: Full. Code Status History    Date Active Date Inactive Code Status Order ID Comments User Context   09/04/2015 10:25 PM 09/08/2015  1:36 PM Full Code [id number]  Demetrios Loll, MD Inpatient   07/27/2015  6:07 PM 08/01/2015  6:43 PM Full Code [id number]  Lytle Butte, MD ED   06/03/2015  1:42 PM 06/05/2015  5:01 PM Full Code [id number]  Idelle Crouch, MD Inpatient   02/16/2015  9:36 AM 02/18/2015  4:33 PM Full Code [id number]  Fritzi Mandes, MD Inpatient   02/03/2015  2:32 AM 02/05/2015  4:34 PM Full Code [id number]  Lytle Butte, MD ED       TOTAL TIME TAKING CARE OF THIS PATIENT: 50 minutes.    Vaughan Basta M.D on 05/19/2016   Between 7am to 6pm - Pager - [phone]  After 6pm go to [email or web address] - password [email or web address] [facility]  Office  [phone]  CC: Primary care physician; [name], MD   Note: This dictation was prepared with Dragon dictation along with smaller phrase technology. Any transcriptional errors that result from this process are unintentional.

## 2016-05-19 NOTE — Progress Notes (Addendum)
Notified MD of low BP per MD do not give Losartan and hydrochlorithiazide. Pt does not want to take levaquin as she stated that it makes her sick. MD to change med will not adminster at this time

## 2016-05-19 NOTE — Consult Note (Signed)
Cardiology Consultation Note  Patient ID: Lauren Bartlett, MRN: [id number], DOB/AGE: October 31, 1955 61 y.o. Admit date: 05/19/2016   Date of Consult: 05/19/2016 Primary Physician: [name], MD Primary Cardiologist: [name], MD Requesting Physician: Dr. Anselm Jungling, MD  Chief Complaint: Presented for outpatient TEE, found to have AECOPD Reason for Consult: TEE  HPI: 61 y.o. female with h/o severe mitral valve prolapse involving the posterior leaflet with severe anterior mitral regurgitation, COPD on nighttime oxygen at 2 L secondary to prior tobacco smoke exposure, HTN, anxiety, and depression who presented to [facility] on 2/19 for outpatient TEE that was cancelled 2/2 AECOPD requiring admission.  Patient was previously followed by Dr. Clayborn Bigness, MD for her mitral valve disease. She recently underwent TTE ordered by PCP in January 2018 that showed worsening of her mitral valve disease and she was referred to [name]. Recent TTE in 03/2016 showed normal LV systolic function, 0000000, calcified mitral valve annulus with severe prolapse involving the posterior leaflet with severe anterior mitral regurgitation, moderately dilated left atrium and moderate to severe pulmonary hypertension with a systolic pulmonary pressure of 60 mmHg. She was scheduled for TEE today. She was also scheduled for [facility] on 2/22 given the above.   She has had multiple ED visits and urgent care visits for AECOPD recently. She most recently went to an urgent care the week prior for AECOPD and was given steroids and ABX. She did not note much improvement in her breathing. This morning she noted a worsening of her breathing compared to the above. Upon her arrival to [facility] for scheduled TEE she was noted to have AECOPD requiring oxygen supplementation 2/2 hypoxia and her TEE was cancelled. She was admitted by IM.   Lastly, she has noticed an increase in her weight by about 1 pound daily over the past week. When she saw [name] on 2/13 her  weight was 218 pounds. Upon admission her weight was noted to be 219 pounds. CXR, EKG, and labs are pending at this time.   Past Medical History:  Diagnosis Date  . Anxiety   . Congenital absence of one kidney   . COPD (chronic obstructive pulmonary disease) ([place])   . Depression    treated at [place]  . Emphysema/COPD ([place])   . History of sciatica   . Hypertension   . hypothyrodism   . Hypothyroidism   . Motion sickness    cars  . MVP (mitral valve prolapse)   . PONV (postoperative nausea and vomiting)   . Pulmonary hypertension   . Sleep apnea    O2 at night and PRN      Most Recent Cardiac Studies: TTE 03/2016: Study Conclusions  - Left ventricle: The cavity size was mildly dilated. There was   mild concentric hypertrophy. Systolic function was normal. The   estimated ejection fraction was in the range of 55% to 60%. Wall   motion was normal; there were no regional wall motion   abnormalities. Features are consistent with a pseudonormal left   ventricular filling pattern, with concomitant abnormal relaxation   and increased filling pressure (grade 2 diastolic dysfunction). - Mitral valve: Calcified annulus. Severe prolapse, involving the   posterior leaflet. There was severe regurgitation directed   eccentrically and anteriorly. - Left atrium: The atrium was moderately dilated. - Pulmonary arteries: Systolic pressure was moderately to severely   increased. PA peak pressure: 60 mm Hg (S).  Impressions:  - Recommend cardiac evaluation and consider TEE to evaluate mitral  valve.   Surgical History:  Past Surgical History:  Procedure Laterality Date  . ABDOMINAL HYSTERECTOMY    . BREAST BIOPSY Right 2011   [facility]< benign  . CATARACT EXTRACTION W/PHACO Left 11/14/2015   Procedure: CATARACT EXTRACTION PHACO AND INTRAOCULAR LENS PLACEMENT (IOC);  Surgeon: Leandrew Koyanagi, MD;  Location: [place];  Service: Ophthalmology;  Laterality: Left;  sleep  apnea Toric  . CATARACT EXTRACTION W/PHACO Right 12/19/2015   Procedure: CATARACT EXTRACTION PHACO AND INTRAOCULAR LENS PLACEMENT (IOC);  Surgeon: Leandrew Koyanagi, MD;  Location: [place];  Service: Ophthalmology;  Laterality: Right;  ANXIETY GENEROUS IV SEDATION TORIC LEN  . COMBINED HYSTERECTOMY ABDOMINAL W/ A&P REPAIR / OOPHORECTOMY  1996   benign tumor  . INNER EAR SURGERY     bilateral     Home Meds: Prior to Admission medications   Medication Sig Start Date End Date Taking? Authorizing Provider  albuterol (PROVENTIL HFA;VENTOLIN HFA) 108 (90 Base) MCG/ACT inhaler Inhale 2 puffs into the lungs every 6 (six) hours as needed for wheezing or shortness of breath. Pt uses with SPACER.   Yes Historical Provider, MD  albuterol (PROVENTIL) (2.5 MG/3ML) 0.083% nebulizer solution Take 2.5 mg by nebulization every 6 (six) hours as needed for wheezing or shortness of breath.   Yes Historical Provider, MD  doxycycline (VIBRAMYCIN) 100 MG capsule Take 1 capsule (100 mg total) by mouth 2 (two) times daily. 05/16/16  Yes Marylene Land, NP  fluticasone (FLONASE) 50 MCG/ACT nasal spray Place 2 sprays into both nostrils daily.   Yes Historical Provider, MD  fluticasone (FLOVENT HFA) 110 MCG/ACT inhaler Inhale 1 puff into the lungs 2 (two) times daily.   Yes Historical Provider, MD  hydrochlorothiazide (HYDRODIURIL) 25 MG tablet TAKE 1/2 TABLET(12.5 MG) BY MOUTH DAILY 10/22/15  Yes [name], MD  hydroxypropyl methylcellulose / hypromellose (ISOPTO TEARS / GONIOVISC) 2.5 % ophthalmic solution Place 1 drop into both eyes 2 (two) times daily.   Yes Historical Provider, MD  levothyroxine (SYNTHROID, LEVOTHROID) 112 MCG tablet Take 112 mcg by mouth daily before breakfast.   Yes Historical Provider, MD  loratadine-pseudoephedrine (CLARITIN-D 24-HOUR) 10-240 MG 24 hr tablet Take 1 tablet by mouth daily.   Yes Historical Provider, MD  LORazepam (ATIVAN) 0.5 MG tablet Take 1 tablet (0.5 mg total) by  mouth every 8 (eight) hours as needed for anxiety. 04/18/16 04/18/17 Yes Earleen Newport, MD  losartan (COZAAR) 100 MG tablet TAKE 1 TABLET(100 MG) BY MOUTH DAILY 04/28/16  Yes [name], MD  montelukast (SINGULAIR) 10 MG tablet Take 1 tablet (10 mg total) by mouth at bedtime. 06/05/15  Yes Fritzi Mandes, MD  OXYGEN Inhale 2 L into the lungs at bedtime as needed (at bedtime and as needed).   Yes Historical Provider, MD  polyethylene glycol powder (GLYCOLAX/MIRALAX) powder Take 17 g by mouth 2 (two) times daily as needed. 08/03/15  Yes [name], MD  traMADol (ULTRAM) 50 MG tablet 1-2 tabs po q 6 hr prn pain Maximum dose= 8 tablets per day 11/09/15  Yes Melynda Ripple, MD  umeclidinium-vilanterol [facility] ELLIPTA) 62.5-25 MCG/INH AEPB Inhale 1 puff into the lungs daily. 04/28/16  Yes [name], MD    Inpatient Medications:  . budesonide (PULMICORT) nebulizer solution  0.25 mg Nebulization BID  . ipratropium-albuterol  3 mL Nebulization Q4H  . levofloxacin  500 mg Oral Daily  . methylPREDNISolone (SOLU-MEDROL) injection  60 mg Intravenous Q6H  . tiotropium  18 mcg Inhalation Daily   .  sodium chloride 50 mL/hr at 05/19/16 0710    Allergies:  Allergies  Allergen Reactions  . Codeine Hives and Nausea And Vomiting  . Penicillins Hives, Itching and Other (See Comments)    Has patient had a PCN reaction causing immediate rash, facial/tongue/throat swelling, SOB or lightheadedness with hypotension: No Has patient had a PCN reaction causing severe rash involving mucus membranes or skin necrosis: No Has patient had a PCN reaction that required hospitalization No Has patient had a PCN reaction occurring within the last 10 years: No If all of the above answers are "NO", then may proceed with Cephalosporin use.  [name] Zithromax [Azithromycin] Other (See Comments)    Reaction:  Hallucinations   . Prednisone Palpitations and Other (See Comments)    Reaction:  Hallucinations     Social History     Social History  . Marital status: Single    Spouse name: N/A  . Number of children: N/A  . Years of education: N/A   Occupational History  . cna     part time   Social History Main Topics  . Smoking status: Never Smoker  . Smokeless tobacco: Never Used  . Alcohol use No  . Drug use: No  . Sexual activity: Not Currently   Other Topics Concern  . Not on file   Social History Narrative   Lives in a group home for adults           Family History  Problem Relation Age of Onset  . Multiple sclerosis Mother   . Coronary artery disease Father   . Heart disease Brother   . Breast cancer Paternal Aunt      Review of Systems: Review of Systems  Constitutional: Positive for malaise/fatigue. Negative for chills, diaphoresis, fever and weight loss.  HENT: Negative for congestion.   Eyes: Negative for discharge and redness.  Respiratory: Positive for cough, sputum production, shortness of breath and wheezing. Negative for hemoptysis.        Brown sputum  Cardiovascular: Positive for leg swelling. Negative for chest pain, palpitations, orthopnea, claudication and PND.  Gastrointestinal: Negative for abdominal pain, blood in stool, heartburn, melena, nausea and vomiting.  Genitourinary: Negative for hematuria.  Musculoskeletal: Negative for falls and myalgias.  Skin: Negative for rash.  Neurological: Positive for weakness. Negative for dizziness, tingling, tremors, sensory change, speech change, focal weakness and loss of consciousness.  Endo/Heme/Allergies: Does not bruise/bleed easily.  Psychiatric/Behavioral: Negative for substance abuse. The patient is not nervous/anxious.   All other systems reviewed and are negative.   Labs: No results for input(s): CKTOTAL, CKMB, TROPONINI in the last 72 hours. Lab Results  Component Value Date   WBC 7.4 05/13/2016   HGB 12.2 02/26/2016   HCT 36.5 05/13/2016   MCV 89 05/13/2016   PLT 190 05/13/2016    Recent Labs Lab  05/13/16 1442  NA 143  K 4.4  CL 99  CO2 24  BUN 18  CREATININE 0.85  CALCIUM 8.3*  GLUCOSE 93   Lab Results  Component Value Date   CHOL 173 03/14/2015   HDL 57.10 03/14/2015   LDLCALC 99 03/14/2015   TRIG 85.0 03/14/2015   No results found for: DDIMER  Radiology/Studies:  No results found.  EKG: Interpreted by me showed: pending Telemetry: Interpreted by me showed: NSR, 70's bpm  Weights: Filed Weights   05/19/16 0658  Weight: 219 lb (99.3 kg)     Physical Exam: Blood pressure (!) 150/83, resp. rate 18, height 5'  2" (1.575 m), weight 219 lb (99.3 kg), SpO2 95 %. Body mass index is 40.06 kg/m. General: Well developed, well nourished, in no acute distress. Head: Normocephalic, atraumatic, sclera non-icteric, no xanthomas, nares are without discharge.  Neck: Negative for carotid bruits. JVD not elevated. Lungs: Diffuse, coarse breath sounds bilaterally with expiratory wheezing and faint bibasilar crackles. Breathing is unlabored on 2 L via nasal cannula.  Heart: RRR with S1 S2. III/VI systolic murmurs, no rubs, or gallops appreciated. Abdomen: Soft, non-tender, non-distended with normoactive bowel sounds. No hepatomegaly. No rebound/guarding. No obvious abdominal masses. Msk:  Strength and tone appear normal for age. Extremities: No clubbing or cyanosis. Trace pre-tibial edema. Distal pedal pulses are 2+ and equal bilaterally. Neuro: Alert and oriented X 3. No facial asymmetry. No focal deficit. Moves all extremities spontaneously. Psych:  Responds to questions appropriately with a normal affect.    Assessment and Plan:  Principal Problem:   COPD exacerbation ([place]) Active Problems:   COPD (chronic obstructive pulmonary disease) ([place])    1. Severe mitral valve prolapse involving the posterior leaflet and severe mitrla regurgitation involving the anterior mitral valve: -TEE cancelled today 2/2 AECOPD -Will need to treat AECOPD with planned TEE prior to discharge  this admission -Will need R/LHC as below   2. AECOPD: -Per IM  3. Pulmonary HTN: -She was previously scheduled for [facility] on 2/22 as an outpatient -Can likely perform this as an inpatient prior to discharge once her COPD exacerbation is improved -Gentle diuresis, consider addition of IV Lasix over PO HCTZ   Signed, Christell Faith, PA-C [place] Pager: [phone] 05/19/2016, 9:32 AM

## 2016-05-20 ENCOUNTER — Encounter: Payer: Self-pay | Admitting: Cardiovascular Disease

## 2016-05-20 DIAGNOSIS — I110 Hypertensive heart disease with heart failure: Secondary | ICD-10-CM | POA: Diagnosis not present

## 2016-05-20 DIAGNOSIS — E039 Hypothyroidism, unspecified: Secondary | ICD-10-CM | POA: Diagnosis not present

## 2016-05-20 DIAGNOSIS — J101 Influenza due to other identified influenza virus with other respiratory manifestations: Secondary | ICD-10-CM | POA: Diagnosis not present

## 2016-05-20 DIAGNOSIS — I272 Pulmonary hypertension, unspecified: Secondary | ICD-10-CM | POA: Diagnosis not present

## 2016-05-20 DIAGNOSIS — I340 Nonrheumatic mitral (valve) insufficiency: Secondary | ICD-10-CM | POA: Diagnosis not present

## 2016-05-20 DIAGNOSIS — Z8249 Family history of ischemic heart disease and other diseases of the circulatory system: Secondary | ICD-10-CM | POA: Diagnosis not present

## 2016-05-20 DIAGNOSIS — Z803 Family history of malignant neoplasm of breast: Secondary | ICD-10-CM | POA: Diagnosis not present

## 2016-05-20 DIAGNOSIS — J441 Chronic obstructive pulmonary disease with (acute) exacerbation: Principal | ICD-10-CM

## 2016-05-20 DIAGNOSIS — J9621 Acute and chronic respiratory failure with hypoxia: Secondary | ICD-10-CM | POA: Diagnosis not present

## 2016-05-20 DIAGNOSIS — G4730 Sleep apnea, unspecified: Secondary | ICD-10-CM | POA: Diagnosis not present

## 2016-05-20 DIAGNOSIS — Z9981 Dependence on supplemental oxygen: Secondary | ICD-10-CM | POA: Diagnosis not present

## 2016-05-20 DIAGNOSIS — I341 Nonrheumatic mitral (valve) prolapse: Secondary | ICD-10-CM | POA: Diagnosis not present

## 2016-05-20 DIAGNOSIS — I5031 Acute diastolic (congestive) heart failure: Secondary | ICD-10-CM | POA: Diagnosis not present

## 2016-05-20 DIAGNOSIS — R000 Tachycardia, unspecified: Secondary | ICD-10-CM | POA: Diagnosis not present

## 2016-05-20 LAB — BASIC METABOLIC PANEL
ANION GAP: 7 (ref 5–15)
BUN: 20 mg/dL (ref 6–20)
CALCIUM: 7.5 mg/dL — AB (ref 8.9–10.3)
CHLORIDE: 105 mmol/L (ref 101–111)
CO2: 27 mmol/L (ref 22–32)
Creatinine, Ser: 0.84 mg/dL (ref 0.44–1.00)
GFR calc non Af Amer: >60 mL/min (ref >60)
Glucose, Bld: 142 mg/dL — ABNORMAL HIGH (ref 65–99)
Potassium: 4.1 mmol/L (ref 3.5–5.1)
Sodium: 139 mmol/L (ref 135–145)

## 2016-05-20 LAB — CBC
HCT: 33.3 % — ABNORMAL LOW (ref 35.0–47.0)
HEMOGLOBIN: 11.1 g/dL — AB (ref 12.0–16.0)
MCH: 29.9 pg (ref 26.0–34.0)
MCHC: 33.3 g/dL (ref 32.0–36.0)
MCV: 89.8 fL (ref 80.0–100.0)
Platelets: 130 10*3/uL — ABNORMAL LOW (ref 150–440)
RBC: 3.71 MIL/uL — AB (ref 3.80–5.20)
RDW: 14.1 % (ref 11.5–14.5)
WBC: 3.1 10*3/uL — AB (ref 3.6–11.0)

## 2016-05-20 LAB — HIV ANTIBODY (ROUTINE TESTING W REFLEX): HIV Screen 4th Generation wRfx: NONREACTIVE

## 2016-05-20 MED ORDER — PROMETHAZINE HCL 25 MG/ML IJ SOLN
12.5000 mg | Freq: Once | INTRAMUSCULAR | Status: AC
  Administered 2016-05-20: 12.5 mg via INTRAVENOUS
  Filled 2016-05-20: qty 1

## 2016-05-20 MED ORDER — METHYLPREDNISOLONE SODIUM SUCC 125 MG IJ SOLR
60.0000 mg | INTRAMUSCULAR | Status: DC
  Administered 2016-05-21: 60 mg via INTRAVENOUS
  Filled 2016-05-20 (×2): qty 2

## 2016-05-20 NOTE — Progress Notes (Signed)
Patient Name: Lauren Bartlett Date of Encounter: 05/20/2016  Primary Cardiologist: [facility] Problem List     Principal Problem:   COPD exacerbation [facility]) Active Problems:   Influenza B   Severe mitral regurgitation by prior echocardiogram   Pulmonary hypertension (HCC)   COPD (chronic obstructive pulmonary disease) (HCC)     Subjective   SOB improved this morning. Remains on 2 L via nasal cannula (on home O2 at nighttime at baseline). Found to have influenza B upon admission. On Tamiflu. No chest pain.   Inpatient Medications    Scheduled Meds: . budesonide (PULMICORT) nebulizer solution  0.25 mg Nebulization BID  . fluticasone  2 spray Each Nare Daily  . heparin  5,000 Units Subcutaneous Q8H  . hydrochlorothiazide  25 mg Oral Daily  . ipratropium-albuterol  3 mL Nebulization Q4H  . levofloxacin (LEVAQUIN) IV  250 mg Intravenous Q24H  . levothyroxine  112 mcg Oral QAC breakfast  . pseudoephedrine  120 mg Oral BID   And  . loratadine  10 mg Oral Daily  . losartan  50 mg Oral Daily  . methylPREDNISolone (SOLU-MEDROL) injection  60 mg Intravenous Q6H  . montelukast  10 mg Oral QHS  . oseltamivir  75 mg Oral BID  . polyvinyl alcohol  1 drop Both Eyes BID  . sodium chloride flush  3 mL Intravenous Q12H  . tiotropium  18 mcg Inhalation Daily  . umeclidinium-vilanterol  1 puff Inhalation Daily   Continuous Infusions: . sodium chloride 50 mL/hr at 05/20/16 0754   PRN Meds: docusate sodium, LORazepam, ondansetron (ZOFRAN) IV, polyethylene glycol, traMADol   Vital Signs    Vitals:   05/20/16 0408 05/20/16 0439 05/20/16 0801 05/20/16 1011  BP:  (!) 122/59 (!) 109/52 (!) 124/55  Pulse:  79 73   Resp:   18   Temp:  97.3 F (36.3 C) 98.3 F (36.8 C)   TempSrc:  Oral    SpO2: 97% 98% 100% 99%  Weight:      Height:        Intake/Output Summary (Last 24 hours) at 05/20/16 1113 Last data filed at 05/20/16 1031  Gross per 24 hour  Intake             1470 ml    Output             2400 ml  Net             -930 ml   Filed Weights   05/19/16 0658  Weight: 219 lb (99.3 kg)    Physical Exam    GEN: Well nourished, well developed, in no acute distress.  HEENT: Grossly normal.  Neck: Supple, no JVD, carotid bruits, or masses. Cardiac: RRR,III/VI systolic murmur, no rubs, or gallops. No clubbing, cyanosis, edema.  Radials/DP/PT 2+ and equal bilaterally.  Respiratory:  Coarse breath sounds bilaterally with mild expiratory wheezing. GI: Soft, nontender, nondistended, BS + x 4. MS: no deformity or atrophy. Skin: warm and dry, no rash. Neuro:  Strength and sensation are intact. Psych: AAOx3.  Normal affect.  Labs    CBC  Recent Labs  05/19/16 1308 05/20/16 0534  WBC 5.0 3.1*  HGB 11.4* 11.1*  HCT 34.0* 33.3*  MCV 89.5 89.8  PLT 140* [id number]   Basic Metabolic Panel  Recent Labs  05/19/16 1308 05/20/16 0534  NA 139 139  K 3.9 4.1  CL 104 105  CO2 28 27  GLUCOSE 116* 142*  BUN 18 20  CREATININE 0.97 0.84  CALCIUM 7.2* 7.5*   Liver Function Tests No results for input(s): AST, ALT, ALKPHOS, BILITOT, PROT, ALBUMIN in the last 72 hours. No results for input(s): LIPASE, AMYLASE in the last 72 hours. Cardiac Enzymes No results for input(s): CKTOTAL, CKMB, CKMBINDEX, TROPONINI in the last 72 hours. BNP Invalid input(s): POCBNP D-Dimer No results for input(s): DDIMER in the last 72 hours. Hemoglobin A1C No results for input(s): HGBA1C in the last 72 hours. Fasting Lipid Panel No results for input(s): CHOL, HDL, LDLCALC, TRIG, CHOLHDL, LDLDIRECT in the last 72 hours. Thyroid Function Tests No results for input(s): TSH, T4TOTAL, T3FREE, THYROIDAB in the last 72 hours.  Invalid input(s): FREET3  Telemetry    NSR, 70's bpm - Personally Reviewed  ECG    n/a - Personally Reviewed  Radiology    Dg Chest 2 View  Result Date: 05/19/2016 CLINICAL DATA:  Shortness of breath. EXAM: CHEST  2 VIEW COMPARISON:  04/18/2016. FINDINGS:  Mediastinum and hilar structures normal. Cardiomegaly with normal pulmonary vascularity. Low lung volumes with mild basilar atelectasis. No pleural effusion or pneumothorax. No acute bony abnormalities. IMPRESSION: Low lung volumes with mild basilar atelectasis . Electronically Signed   By: Marcello Moores  Register   On: 05/19/2016 12:01    Cardiac Studies   TTE 03/2016: Study Conclusions  - Left ventricle: The cavity size was mildly dilated. There was mild concentric hypertrophy. Systolic function was normal. The estimated ejection fraction was in the range of 55% to 60%. Wall motion was normal; there were no regional wall motion abnormalities. Features are consistent with a pseudonormal left ventricular filling pattern, with concomitant abnormal relaxation and increased filling pressure (grade 2 diastolic dysfunction). - Mitral valve: Calcified annulus. Severe prolapse, involving the posterior leaflet. There was severe regurgitation directed eccentrically and anteriorly. - Left atrium: The atrium was moderately dilated. - Pulmonary arteries: Systolic pressure was moderately to severely increased. PA peak pressure: 60 mm Hg (S).  Impressions:  - Recommend cardiac evaluation and consider TEE to evaluate mitral valve.  Patient Profile     61 y.o. female with history of severe mitral valve prolapse involving the posterior leaflet with severe anterior mitral regurgitation, COPD on nighttime oxygen at 2 L secondary to prior tobacco smoke exposure, HTN, anxiety, and depression who presented to [facility] on [facility]/19 for outpatient TEE that was cancelled 2/2 AECOPD requiring admission, subsequently noted to be positive for influenza B.   Assessment & Plan    1. Severe mitral valve prolapse involving the posterior leaflet and severe mitrla regurgitation involving the anterior mitral valve: -TEE cancelled today 2/2 AECOPD -Will need to treat AECOPD with planned TEE prior to discharge  this admission -Will need R/LHC as below   2. AECOPD: -Per IM  3. Pulmonary HTN: -She was previously scheduled for [facility] on 2/22 as an outpatient -Can likely perform this as an inpatient prior to discharge once her COPD exacerbation is improved -Gentle diuresis, consider addition of IV Lasix over PO HCTZ  4. Influenza B: -On Tamiflu -Supportive care per IM   Signed, Christell Faith, PA-C [facility] Pager: [phone] 05/20/2016, 11:13 AM

## 2016-05-20 NOTE — Progress Notes (Signed)
Notified Dr. Anselm Jungling of BP 124/55. Per MD give losartan and hold hydrochlorithiazide

## 2016-05-20 NOTE — Progress Notes (Signed)
[place] at [place] NAME: Lauren Bartlett    MR#:  [id number]  DATE OF BIRTH:  [birth year]  SUBJECTIVE:  CHIEF COMPLAINT:  No chief complaint on file.   Came to have TEE but found exacerbation of COPD and so admitted to medical services.   She was found to have influenza B and under treatment for that, feels slightly better today. REVIEW OF SYSTEMS:  CONSTITUTIONAL: No fever, fatigue or weakness.  EYES: No blurred or double vision.  EARS, NOSE, AND THROAT: No tinnitus or ear pain.  RESPIRATORY: Positive for cough, shortness of breath, wheezing , no hemoptysis.  CARDIOVASCULAR: No chest pain, orthopnea, edema.  GASTROINTESTINAL: No nausea, vomiting, diarrhea or abdominal pain.  GENITOURINARY: No dysuria, hematuria.  ENDOCRINE: No polyuria, nocturia,  HEMATOLOGY: No anemia, easy bruising or bleeding SKIN: No rash or lesion. MUSCULOSKELETAL: No joint pain or arthritis.   NEUROLOGIC: No tingling, numbness, weakness.  PSYCHIATRY: No anxiety or depression.   ROS  DRUG ALLERGIES:   Allergies  Allergen Reactions  . Codeine Hives and Nausea And Vomiting  . Penicillins Hives, Itching and Other (See Comments)    Has patient had a PCN reaction causing immediate rash, facial/tongue/throat swelling, SOB or lightheadedness with hypotension: No Has patient had a PCN reaction causing severe rash involving mucus membranes or skin necrosis: No Has patient had a PCN reaction that required hospitalization No Has patient had a PCN reaction occurring within the last 10 years: No If all of the above answers are "NO", then may proceed with Cephalosporin use.  [name] Zithromax [Azithromycin] Other (See Comments)    Reaction:  Hallucinations   . Prednisone Palpitations and Other (See Comments)    Reaction:  Hallucinations     VITALS:  Blood pressure (!) 133/52, pulse 80, temperature 97.7 F (36.5 C), temperature source Oral, resp. rate 16, height 5\' 2"  (1.575 m),  weight 99.3 kg (219 lb), SpO2 98 %.  PHYSICAL EXAMINATION:  GENERAL:  61 y.o.-year-old patient lying in the bed with no acute distress.  EYES: Pupils equal, round, reactive to light and accommodation. No scleral icterus. Extraocular muscles intact.  HEENT: Head atraumatic, normocephalic. Oropharynx and nasopharynx clear.  NECK:  Supple, no jugular venous distention. No thyroid enlargement, no tenderness.  LUNGS: Normal breath sounds bilaterally, some wheezing, no crepitation. No use of accessory muscles of respiration.  CARDIOVASCULAR: S1, S2 normal. No murmurs, rubs, or gallops.  ABDOMEN: Soft, nontender, nondistended. Bowel sounds present. No organomegaly or mass.  EXTREMITIES: No pedal edema, cyanosis, or clubbing.  NEUROLOGIC: Cranial nerves II through XII are intact. Muscle strength 5/5 in all extremities. Sensation intact. Gait not checked.  PSYCHIATRIC: The patient is alert and oriented x 3.  SKIN: No obvious rash, lesion, or ulcer.   Physical Exam LABORATORY PANEL:   CBC  Recent Labs Lab 05/20/16 0534  WBC 3.1*  HGB 11.1*  HCT 33.3*  PLT 130*   ------------------------------------------------------------------------------------------------------------------  Chemistries   Recent Labs Lab 05/20/16 0534  NA 139  K 4.1  CL 105  CO2 27  GLUCOSE 142*  BUN 20  CREATININE 0.84  CALCIUM 7.5*   ------------------------------------------------------------------------------------------------------------------  Cardiac Enzymes No results for input(s): TROPONINI in the last 168 hours. ------------------------------------------------------------------------------------------------------------------  RADIOLOGY:  Dg Chest 2 View  Result Date: 05/19/2016 CLINICAL DATA:  Shortness of breath. EXAM: CHEST  2 VIEW COMPARISON:  04/18/2016. FINDINGS: Mediastinum and hilar structures normal. Cardiomegaly with normal pulmonary vascularity. Low lung volumes with mild basilar  atelectasis.  No pleural effusion or pneumothorax. No acute bony abnormalities. IMPRESSION: Low lung volumes with mild basilar atelectasis . Electronically Signed   By: [place]   On: 05/19/2016 12:01    ASSESSMENT AND PLAN:   Principal Problem:   COPD exacerbation ([place]) Active Problems:   Severe mitral regurgitation by prior echocardiogram   Pulmonary hypertension (HCC)   COPD (chronic obstructive pulmonary disease) (HCC)   Influenza B  * Ac COPD exacerbation   IV steroids, nebs, Oral levaquin.   Spiriva, Duoneb, and inhaled steroids.   Pro-calcitonin his low so stop Levaquin.  * MVP   [facility] per cardio, currently hold work ups due to COPD.   May re-schedule TEE once pt improve.  * hypothyroidism   Cont levothyroxine.  * Htn   Losartan, HCTZ.  * Influenza B   tamiflu.     All the records are reviewed and case discussed with Care Management/Social Workerr. Management plans discussed with the patient, family and they are in agreement.  CODE STATUS: Full.  TOTAL TIME TAKING CARE OF THIS PATIENT: 35 minutes.     POSSIBLE D/C IN 1-2 DAYS, DEPENDING ON CLINICAL CONDITION.   Vaughan Basta M.D on 05/20/2016   Between 7am to 6pm - Pager - [phone]  After 6pm go to [email or web address] - password [email or web address] [place] [facility]  Office  [phone]  CC: Primary care physician; [name], MD  Note: This dictation was prepared with Dragon dictation along with smaller phrase technology. Any transcriptional errors that result from this process are unintentional.

## 2016-05-21 ENCOUNTER — Inpatient Hospital Stay: Payer: Medicare Other

## 2016-05-21 DIAGNOSIS — Z8249 Family history of ischemic heart disease and other diseases of the circulatory system: Secondary | ICD-10-CM | POA: Diagnosis not present

## 2016-05-21 DIAGNOSIS — I110 Hypertensive heart disease with heart failure: Secondary | ICD-10-CM | POA: Diagnosis not present

## 2016-05-21 DIAGNOSIS — I341 Nonrheumatic mitral (valve) prolapse: Secondary | ICD-10-CM | POA: Diagnosis not present

## 2016-05-21 DIAGNOSIS — Z9981 Dependence on supplemental oxygen: Secondary | ICD-10-CM | POA: Diagnosis not present

## 2016-05-21 DIAGNOSIS — Z803 Family history of malignant neoplasm of breast: Secondary | ICD-10-CM | POA: Diagnosis not present

## 2016-05-21 DIAGNOSIS — G4730 Sleep apnea, unspecified: Secondary | ICD-10-CM | POA: Diagnosis not present

## 2016-05-21 DIAGNOSIS — J441 Chronic obstructive pulmonary disease with (acute) exacerbation: Secondary | ICD-10-CM | POA: Diagnosis not present

## 2016-05-21 DIAGNOSIS — J9621 Acute and chronic respiratory failure with hypoxia: Secondary | ICD-10-CM | POA: Diagnosis not present

## 2016-05-21 DIAGNOSIS — R0902 Hypoxemia: Secondary | ICD-10-CM

## 2016-05-21 DIAGNOSIS — R000 Tachycardia, unspecified: Secondary | ICD-10-CM | POA: Diagnosis not present

## 2016-05-21 DIAGNOSIS — E039 Hypothyroidism, unspecified: Secondary | ICD-10-CM | POA: Diagnosis not present

## 2016-05-21 DIAGNOSIS — I340 Nonrheumatic mitral (valve) insufficiency: Secondary | ICD-10-CM | POA: Diagnosis not present

## 2016-05-21 DIAGNOSIS — I5031 Acute diastolic (congestive) heart failure: Secondary | ICD-10-CM | POA: Diagnosis not present

## 2016-05-21 DIAGNOSIS — I272 Pulmonary hypertension, unspecified: Secondary | ICD-10-CM | POA: Diagnosis not present

## 2016-05-21 LAB — PROCALCITONIN: Procalcitonin: <0.1 ng/mL

## 2016-05-21 NOTE — Progress Notes (Signed)
[place] at [place] NAME: Lauren Bartlett    MR#:  [id number]  DATE OF BIRTH:  11-21-1955  SUBJECTIVE:  CHIEF COMPLAINT:  No chief complaint on file.   Came to have TEE but found exacerbation of COPD and so admitted to medical services.   She was found to have influenza B and under treatment for that, feels still having wheezing.  REVIEW OF SYSTEMS:  CONSTITUTIONAL: No fever, fatigue or weakness.  EYES: No blurred or double vision.  EARS, NOSE, AND THROAT: No tinnitus or ear pain.  RESPIRATORY: Positive for cough, shortness of breath, wheezing , no hemoptysis.  CARDIOVASCULAR: No chest pain, orthopnea, edema.  GASTROINTESTINAL: No nausea, vomiting, diarrhea or abdominal pain.  GENITOURINARY: No dysuria, hematuria.  ENDOCRINE: No polyuria, nocturia,  HEMATOLOGY: No anemia, easy bruising or bleeding SKIN: No rash or lesion. MUSCULOSKELETAL: No joint pain or arthritis.   NEUROLOGIC: No tingling, numbness, weakness.  PSYCHIATRY: No anxiety or depression.   ROS  DRUG ALLERGIES:   Allergies  Allergen Reactions  . Codeine Hives and Nausea And Vomiting  . Penicillins Hives, Itching and Other (See Comments)    Has patient had a PCN reaction causing immediate rash, facial/tongue/throat swelling, SOB or lightheadedness with hypotension: No Has patient had a PCN reaction causing severe rash involving mucus membranes or skin necrosis: No Has patient had a PCN reaction that required hospitalization No Has patient had a PCN reaction occurring within the last 10 years: No If all of the above answers are "NO", then may proceed with Cephalosporin use.  [name] Zithromax [Azithromycin] Other (See Comments)    Reaction:  Hallucinations   . Prednisone Palpitations and Other (See Comments)    Reaction:  Hallucinations     VITALS:  Blood pressure (!) 111/51, pulse (!) 102, temperature 98.2 F (36.8 C), temperature source Oral, resp. rate 18, height 5\' 2"   (1.575 m), weight 99.3 kg (219 lb), SpO2 99 %.  PHYSICAL EXAMINATION:  GENERAL:  61 y.o.-year-old patient lying in the bed with no acute distress.  EYES: Pupils equal, round, reactive to light and accommodation. No scleral icterus. Extraocular muscles intact.  HEENT: Head atraumatic, normocephalic. Oropharynx and nasopharynx clear.  NECK:  Supple, no jugular venous distention. No thyroid enlargement, no tenderness.  LUNGS: Normal breath sounds bilaterally, some wheezing, no crepitation. No use of accessory muscles of respiration.  CARDIOVASCULAR: S1, S2 normal. No murmurs, rubs, or gallops.  ABDOMEN: Soft, nontender, nondistended. Bowel sounds present. No organomegaly or mass.  EXTREMITIES: No pedal edema, cyanosis, or clubbing.  NEUROLOGIC: Cranial nerves II through XII are intact. Muscle strength 5/5 in all extremities. Sensation intact. Gait not checked.  PSYCHIATRIC: The patient is alert and oriented x 3.  SKIN: No obvious rash, lesion, or ulcer.   Physical Exam LABORATORY PANEL:   CBC  Recent Labs Lab 05/20/16 0534  WBC 3.1*  HGB 11.1*  HCT 33.3*  PLT 130*   ------------------------------------------------------------------------------------------------------------------  Chemistries   Recent Labs Lab 05/20/16 0534  NA 139  K 4.1  CL 105  CO2 27  GLUCOSE 142*  BUN 20  CREATININE 0.84  CALCIUM 7.5*   ------------------------------------------------------------------------------------------------------------------  Cardiac Enzymes No results for input(s): TROPONINI in the last 168 hours. ------------------------------------------------------------------------------------------------------------------  RADIOLOGY:  Dg Chest 2 View  Result Date: 05/21/2016 CLINICAL DATA:  61 year old female diagnosed with influenza. Hypoxia, query superimposed pneumonia. Initial encounter. EXAM: CHEST  2 VIEW COMPARISON:  05/19/2016 and earlier. FINDINGS: Seated AP and lateral  views  of the chest. Continued low lung volumes and mild respiratory motion today. Platelike opacity at the right lung base with underlying elevated right hemidiaphragm appears stable. Questionable small left pleural effusion now. Interval increased interstitial opacity in the left lower lung. No other confluent opacity. Stable cardiac size and mediastinal contours. Visualized tracheal air column is within normal limits. No pneumothorax. No acute osseous abnormality identified. IMPRESSION: 1. Possible new small left pleural effusion. Increased left lower lung interstitial opacity compatible with viral or atypical respiratory infection. 2. Stable right lung base atelectasis. Electronically Signed   By: Genevie Ann M.D.   On: 05/21/2016 11:13    ASSESSMENT AND PLAN:   Principal Problem:   COPD exacerbation (HCC) Active Problems:   Severe mitral regurgitation by prior echocardiogram   Pulmonary hypertension (HCC)   COPD (chronic obstructive pulmonary disease) (HCC)   Influenza B  * Ac COPD exacerbation   IV steroids, nebs, Oral levaquin.   Spiriva, Duoneb, and inhaled steroids.   Pro-calcitonin is low so stop Levaquin.  Xray chest shows some atelactesis , may be influenza related pneumonitis.  She is able to come off from O2 today.   Seems like had- anxiety episode last night.  * MVP   [facility] per cardio, currently hold work ups due to COPD.   May re-schedule TEE and cath once pt improve.  * hypothyroidism   Cont levothyroxine.  * Htn   Losartan, HCTZ.  * Influenza B   tamiflu.     All the records are reviewed and case discussed with Care Management/Social Workerr. Management plans discussed with the patient, family and they are in agreement.  CODE STATUS: Full.  TOTAL TIME TAKING CARE OF THIS PATIENT: 35 minutes.     POSSIBLE D/C IN 1-2 DAYS, DEPENDING ON CLINICAL CONDITION.   Lauren Bartlett M.D on 05/21/2016   Between 7am to 6pm - Pager - [phone]  After 6pm  go to [email or web address] - password [email or web address] [place] [facility]  Office  [phone]  CC: Primary care physician; Lauren Mc, MD  Note: This dictation was prepared with Dragon dictation along with smaller phrase technology. Any transcriptional errors that result from this process are unintentional.

## 2016-05-21 NOTE — Progress Notes (Signed)
Patient Name: Lauren Bartlett Date of Encounter: 05/21/2016  Primary Cardiologist: [facility] Problem List     Principal Problem:   COPD exacerbation [facility]) Active Problems:   Influenza B   Severe mitral regurgitation by prior echocardiogram   Pulmonary hypertension (HCC)   COPD (chronic obstructive pulmonary disease) ([place])     Subjective   More short of breath this morning. Remains on 2 L via nasal cannula. Anxious overnight. Remains mildly net negative.   Inpatient Medications    Scheduled Meds: . budesonide (PULMICORT) nebulizer solution  0.25 mg Nebulization BID  . fluticasone  2 spray Each Nare Daily  . heparin  5,000 Units Subcutaneous Q8H  . hydrochlorothiazide  25 mg Oral Daily  . ipratropium-albuterol  3 mL Nebulization Q4H  . levothyroxine  112 mcg Oral QAC breakfast  . pseudoephedrine  120 mg Oral BID   And  . loratadine  10 mg Oral Daily  . losartan  50 mg Oral Daily  . methylPREDNISolone (SOLU-MEDROL) injection  60 mg Intravenous Q24H  . montelukast  10 mg Oral QHS  . oseltamivir  75 mg Oral BID  . polyvinyl alcohol  1 drop Both Eyes BID  . sodium chloride flush  3 mL Intravenous Q12H  . tiotropium  18 mcg Inhalation Daily  . umeclidinium-vilanterol  1 puff Inhalation Daily   Continuous Infusions: . sodium chloride 50 mL/hr at 05/21/16 0400   PRN Meds: docusate sodium, LORazepam, ondansetron (ZOFRAN) IV, polyethylene glycol, traMADol   Vital Signs    Vitals:   05/20/16 2013 05/21/16 0542 05/21/16 0803 05/21/16 1033  BP: 139/64 133/73 117/70 115/62  Pulse: 88 93 93   Resp: 16 (!) 24 20   Temp: 98 F (36.7 C) 97.7 F (36.5 C) 98.5 F (36.9 C)   TempSrc: Oral Oral Oral   SpO2: 96% 97% 100%   Weight:      Height:        Intake/Output Summary (Last 24 hours) at 05/21/16 1040 Last data filed at 05/21/16 0824  Gross per 24 hour  Intake             1268 ml  Output             1400 ml  Net             -132 ml   Filed Weights   05/19/16  0658  Weight: 219 lb (99.3 kg)    Physical Exam    GEN: Well nourished, well developed, in no acute distress.  HEENT: Grossly normal.  Neck: Supple, no JVD, carotid bruits, or masses. Cardiac: RRR,III/VI systolic murmur, no rubs, or gallops. No clubbing, cyanosis, edema.  Radials/DP/PT 2+ and equal bilaterally.  Respiratory:  Coarse breath sounds bilaterally with mild expiratory wheezing. GI: Soft, nontender, nondistended, BS + x 4. MS: no deformity or atrophy. Skin: warm and dry, no rash. Neuro:  Strength and sensation are intact. Psych: AAOx3.  Normal affect.  Labs    CBC  Recent Labs  05/19/16 1308 05/20/16 0534  WBC 5.0 3.1*  HGB 11.4* 11.1*  HCT 34.0* 33.3*  MCV 89.5 89.8  PLT 140* [id number]*   Basic Metabolic Panel  Recent Labs  05/19/16 1308 05/20/16 0534  NA 139 139  K 3.9 4.1  CL 104 105  CO2 28 27  GLUCOSE 116* 142*  BUN 18 20  CREATININE 0.97 0.84  CALCIUM 7.2* 7.5*   Liver Function Tests No results for input(s): AST, ALT, ALKPHOS, BILITOT,  PROT, ALBUMIN in the last 72 hours. No results for input(s): LIPASE, AMYLASE in the last 72 hours. Cardiac Enzymes No results for input(s): CKTOTAL, CKMB, CKMBINDEX, TROPONINI in the last 72 hours. BNP Invalid input(s): POCBNP D-Dimer No results for input(s): DDIMER in the last 72 hours. Hemoglobin A1C No results for input(s): HGBA1C in the last 72 hours. Fasting Lipid Panel No results for input(s): CHOL, HDL, LDLCALC, TRIG, CHOLHDL, LDLDIRECT in the last 72 hours. Thyroid Function Tests No results for input(s): TSH, T4TOTAL, T3FREE, THYROIDAB in the last 72 hours.  Invalid input(s): FREET3  Telemetry    Sinus tachcyardia, low 100's, episodes of sinus tachycardia into the 120's bpm - Personally Reviewed  ECG    n/a - Personally Reviewed  Radiology    Dg Chest 2 View  Result Date: 05/19/2016 CLINICAL DATA:  Shortness of breath. EXAM: CHEST  2 VIEW COMPARISON:  04/18/2016. FINDINGS: Mediastinum and  hilar structures normal. Cardiomegaly with normal pulmonary vascularity. Low lung volumes with mild basilar atelectasis. No pleural effusion or pneumothorax. No acute bony abnormalities. IMPRESSION: Low lung volumes with mild basilar atelectasis . Electronically Signed   By: Marcello Moores  Register   On: 05/19/2016 12:01    Cardiac Studies   TTE 03/2016: Study Conclusions  - Left ventricle: The cavity size was mildly dilated. There was mild concentric hypertrophy. Systolic function was normal. The estimated ejection fraction was in the range of 55% to 60%. Wall motion was normal; there were no regional wall motion abnormalities. Features are consistent with a pseudonormal left ventricular filling pattern, with concomitant abnormal relaxation and increased filling pressure (grade 2 diastolic dysfunction). - Mitral valve: Calcified annulus. Severe prolapse, involving the posterior leaflet. There was severe regurgitation directed eccentrically and anteriorly. - Left atrium: The atrium was moderately dilated. - Pulmonary arteries: Systolic pressure was moderately to severely increased. PA peak pressure: 60 mm Hg (S).  Impressions:  - Recommend cardiac evaluation and consider TEE to evaluate mitral valve.  Patient Profile     61 y.o. female with history of severe mitral valve prolapse involving the posterior leaflet with severe anterior mitral regurgitation, COPD on nighttime oxygen at 2 L secondary to prior tobacco smoke exposure, HTN, anxiety, and depression who presented to [facility] on [facility] for outpatient TEE that was cancelled 2/2 AECOPD requiring admission, subsequently noted to be positive for influenza B.   Assessment & Plan    1. Severe mitral valve prolapse involving the posterior leaflet and severe mitrla regurgitation involving the anterior mitral valve: -TEE cancelled 2/2 AECOPD -Will need to treat AECOPD with planned TEE prior to discharge this admission -Will  need R/LHC as below, though will cancel this for 2/22 given continued SOB -Will revisit the above when her breathing improves, possibly as an outpatient   2. AECOPD: -Per IM  3. Pulmonary HTN: -She was previously scheduled for [facility] on 2/22 as an outpatient -Can likely perform this as an inpatient prior to discharge once her COPD exacerbation is improved -Gentle diuresis, consider addition of IV Lasix over PO HCTZ -Remain net negative  4. Influenza B: -On Tamiflu -Supportive care per IM   Signed, Christell Faith, PA-C [place] Pager: [phone] 05/21/2016, 10:40 AM    Attending Note Patient seen and examined, agree with detailed note above,  Patient presentation and plan discussed on rounds.   Patient was under the impression she was scheduled for TEE and left heart catheterization tomorrow She is still wheezing, very nervous about the procedures Prefers to do the  procedures at a later date  On physical exam coarse wheezing bilaterally, short of breath after walking back from the bathroom still recovering as I am examining her, heart sounds regular, no murmurs appreciated, abdomen soft nontender, no significant lower extremity edema  Lab work reviewed showing positive influenza B Basic metabolic panel normal, hematocrit 33  ----Severe mitral valve regurgitation Recommend outpatient workup given unable to lay flat, continued coughing from influenza She can have left heart right heart catheterization and TEE in 1 or 2 weeks This can be set up as an outpatient  -----Influenza B Still with wheezing, Supportive care    Greater than 50% was spent in counseling and coordination of care with patient Total encounter time 25 minutes or more   Signed: Esmond Plants  M.D., Ph.D. [facility]

## 2016-05-21 NOTE — Progress Notes (Signed)
Spoke with Dr. Anselm Jungling about BP meds due to low BP of 115/62. Also spoke with cardiology PA and per PA administer losartan due to EF. Will hold hyrdochlorithiazide.

## 2016-05-22 ENCOUNTER — Ambulatory Visit: Admit: 2016-05-22 | Payer: Medicare Other | Admitting: Cardiovascular Disease

## 2016-05-22 DIAGNOSIS — I5031 Acute diastolic (congestive) heart failure: Secondary | ICD-10-CM | POA: Diagnosis not present

## 2016-05-22 DIAGNOSIS — Z9981 Dependence on supplemental oxygen: Secondary | ICD-10-CM | POA: Diagnosis not present

## 2016-05-22 DIAGNOSIS — G4730 Sleep apnea, unspecified: Secondary | ICD-10-CM | POA: Diagnosis not present

## 2016-05-22 DIAGNOSIS — J101 Influenza due to other identified influenza virus with other respiratory manifestations: Secondary | ICD-10-CM | POA: Diagnosis not present

## 2016-05-22 DIAGNOSIS — I110 Hypertensive heart disease with heart failure: Secondary | ICD-10-CM | POA: Diagnosis not present

## 2016-05-22 DIAGNOSIS — Z803 Family history of malignant neoplasm of breast: Secondary | ICD-10-CM | POA: Diagnosis not present

## 2016-05-22 DIAGNOSIS — E039 Hypothyroidism, unspecified: Secondary | ICD-10-CM | POA: Diagnosis not present

## 2016-05-22 DIAGNOSIS — R000 Tachycardia, unspecified: Secondary | ICD-10-CM | POA: Diagnosis not present

## 2016-05-22 DIAGNOSIS — I341 Nonrheumatic mitral (valve) prolapse: Secondary | ICD-10-CM | POA: Diagnosis not present

## 2016-05-22 DIAGNOSIS — J9621 Acute and chronic respiratory failure with hypoxia: Secondary | ICD-10-CM | POA: Diagnosis not present

## 2016-05-22 DIAGNOSIS — I272 Pulmonary hypertension, unspecified: Secondary | ICD-10-CM | POA: Diagnosis not present

## 2016-05-22 DIAGNOSIS — I340 Nonrheumatic mitral (valve) insufficiency: Secondary | ICD-10-CM | POA: Diagnosis not present

## 2016-05-22 DIAGNOSIS — Z8249 Family history of ischemic heart disease and other diseases of the circulatory system: Secondary | ICD-10-CM | POA: Diagnosis not present

## 2016-05-22 DIAGNOSIS — J441 Chronic obstructive pulmonary disease with (acute) exacerbation: Secondary | ICD-10-CM | POA: Diagnosis not present

## 2016-05-22 LAB — BASIC METABOLIC PANEL
Anion gap: 7 (ref 5–15)
BUN: 11 mg/dL (ref 6–20)
CHLORIDE: 103 mmol/L (ref 101–111)
CO2: 31 mmol/L (ref 22–32)
Calcium: 7 mg/dL — ABNORMAL LOW (ref 8.9–10.3)
Creatinine, Ser: 0.73 mg/dL (ref 0.44–1.00)
GFR calc Af Amer: >60 mL/min (ref >60)
GFR calc non Af Amer: >60 mL/min (ref >60)
GLUCOSE: 82 mg/dL (ref 65–99)
POTASSIUM: 3.6 mmol/L (ref 3.5–5.1)
Sodium: 141 mmol/L (ref 135–145)

## 2016-05-22 LAB — MAGNESIUM: Magnesium: 1.9 mg/dL (ref 1.7–2.4)

## 2016-05-22 SURGERY — RIGHT/LEFT HEART CATH AND CORONARY ANGIOGRAPHY
Anesthesia: Moderate Sedation | Laterality: Bilateral

## 2016-05-22 MED ORDER — FUROSEMIDE 20 MG PO TABS
20.0000 mg | ORAL_TABLET | Freq: Every day | ORAL | Status: DC
  Administered 2016-05-22: 20 mg via ORAL
  Filled 2016-05-22: qty 1

## 2016-05-22 MED ORDER — FUROSEMIDE 10 MG/ML IJ SOLN
20.0000 mg | Freq: Two times a day (BID) | INTRAMUSCULAR | Status: DC
  Administered 2016-05-22 – 2016-05-23 (×2): 20 mg via INTRAVENOUS
  Filled 2016-05-22 (×2): qty 2

## 2016-05-22 MED ORDER — METHYLPREDNISOLONE SODIUM SUCC 40 MG IJ SOLR
40.0000 mg | Freq: Two times a day (BID) | INTRAMUSCULAR | Status: DC
  Administered 2016-05-22 – 2016-05-23 (×2): 40 mg via INTRAVENOUS
  Filled 2016-05-22 (×2): qty 1

## 2016-05-22 MED ORDER — BUDESONIDE 0.5 MG/2ML IN SUSP
0.5000 mg | Freq: Two times a day (BID) | RESPIRATORY_TRACT | Status: DC
  Administered 2016-05-22 – 2016-05-23 (×2): 0.5 mg via RESPIRATORY_TRACT
  Filled 2016-05-22 (×2): qty 2

## 2016-05-22 NOTE — Plan of Care (Signed)
Problem: Acute Rehab PT Goals(only PT should resolve) Goal: Patient Will Transfer Sit To/From Stand With RW. Goal: Pt Will Ambulate Without standing rest break for 300'.

## 2016-05-22 NOTE — Progress Notes (Signed)
Patient Name: Lauren Bartlett Date of Encounter: 05/22/2016  Primary Cardiologist: [facility] Problem List     Principal Problem:   COPD exacerbation [facility]) Active Problems:   Influenza B   Severe mitral regurgitation by prior echocardiogram   Pulmonary hypertension (HCC)   COPD (chronic obstructive pulmonary disease) ([place])   Hypoxia     Subjective   Able to be weaned off oxygen during the day on 2/21, back on her baseline oxygen supplementation overnight. No labs this morning. Remains SOB with significant wheezing. Undergoing breathing treatment at this time. Follow up CXR on 2/21 with new left pleural effusion and possible bibasilar atelectasis. No chest pain or dizziness.   Inpatient Medications    Scheduled Meds: . budesonide (PULMICORT) nebulizer solution  0.25 mg Nebulization BID  . fluticasone  2 spray Each Nare Daily  . heparin  5,000 Units Subcutaneous Q8H  . hydrochlorothiazide  25 mg Oral Daily  . ipratropium-albuterol  3 mL Nebulization Q4H  . levothyroxine  112 mcg Oral QAC breakfast  . pseudoephedrine  120 mg Oral BID   And  . loratadine  10 mg Oral Daily  . losartan  50 mg Oral Daily  . methylPREDNISolone (SOLU-MEDROL) injection  60 mg Intravenous Q24H  . montelukast  10 mg Oral QHS  . oseltamivir  75 mg Oral BID  . polyvinyl alcohol  1 drop Both Eyes BID  . sodium chloride flush  3 mL Intravenous Q12H  . tiotropium  18 mcg Inhalation Daily  . umeclidinium-vilanterol  1 puff Inhalation Daily   Continuous Infusions: . sodium chloride 50 mL/hr at 05/21/16 2304   PRN Meds: docusate sodium, LORazepam, ondansetron (ZOFRAN) IV, polyethylene glycol, traMADol   Vital Signs    Vitals:   05/22/16 0037 05/22/16 0432 05/22/16 0455 05/22/16 0712  BP:   (!) 112/52   Pulse:   87   Resp:   (!) 22   Temp:   98.2 F (36.8 C)   TempSrc:   Oral   SpO2: 97% 98% 97% 98%  Weight:      Height:        Intake/Output Summary (Last 24 hours) at 05/22/16  0727 Last data filed at 05/22/16 [id number]  Gross per 24 hour  Intake             1134 ml  Output             1950 ml  Net             -816 ml   Filed Weights   05/19/16 0658  Weight: 219 lb (99.3 kg)    Physical Exam    GEN: Well nourished, well developed, in no acute distress.  HEENT: Grossly normal.  Neck: Supple, no JVD, carotid bruits, or masses. Cardiac: RRR,III/VI systolic murmur, no rubs, or gallops. No clubbing, cyanosis, edema.  Radials/DP/PT 2+ and equal bilaterally.  Respiratory:  Decreased breath sounds bilaterally with diffuse expiratory wheezing, faint bibasilar crackles left > right. GI: Soft, nontender, nondistended, BS + x 4. MS: no deformity or atrophy. Skin: warm and dry, no rash. Neuro:  Strength and sensation are intact. Psych: AAOx3.  Normal affect.  Labs    CBC  Recent Labs  05/19/16 1308 05/20/16 0534  WBC 5.0 3.1*  HGB 11.4* 11.1*  HCT 34.0* 33.3*  MCV 89.5 89.8  PLT 140* [id number]*   Basic Metabolic Panel  Recent Labs  05/19/16 1308 05/20/16 0534  NA 139 139  K 3.9 4.1  CL 104 105  CO2 28 27  GLUCOSE 116* 142*  BUN 18 20  CREATININE 0.97 0.84  CALCIUM 7.2* 7.5*   Liver Function Tests No results for input(s): AST, ALT, ALKPHOS, BILITOT, PROT, ALBUMIN in the last 72 hours. No results for input(s): LIPASE, AMYLASE in the last 72 hours. Cardiac Enzymes No results for input(s): CKTOTAL, CKMB, CKMBINDEX, TROPONINI in the last 72 hours. BNP Invalid input(s): POCBNP D-Dimer No results for input(s): DDIMER in the last 72 hours. Hemoglobin A1C No results for input(s): HGBA1C in the last 72 hours. Fasting Lipid Panel No results for input(s): CHOL, HDL, LDLCALC, TRIG, CHOLHDL, LDLDIRECT in the last 72 hours. Thyroid Function Tests No results for input(s): TSH, T4TOTAL, T3FREE, THYROIDAB in the last 72 hours.  Invalid input(s): FREET3  Telemetry    Sinus rhythm, 80's bpm currently, episodes of sinus tachycardia into the 120's overnight with  rare short runs of atrial tachycardia - Personally Reviewed  ECG    n/a - Personally Reviewed  Radiology    Dg Chest 2 View  Result Date: 05/21/2016 CLINICAL DATA:  61 year old female diagnosed with influenza. Hypoxia, query superimposed pneumonia. Initial encounter. EXAM: CHEST  2 VIEW COMPARISON:  05/19/2016 and earlier. FINDINGS: Seated AP and lateral views of the chest. Continued low lung volumes and mild respiratory motion today. Platelike opacity at the right lung base with underlying elevated right hemidiaphragm appears stable. Questionable small left pleural effusion now. Interval increased interstitial opacity in the left lower lung. No other confluent opacity. Stable cardiac size and mediastinal contours. Visualized tracheal air column is within normal limits. No pneumothorax. No acute osseous abnormality identified. IMPRESSION: 1. Possible new small left pleural effusion. Increased left lower lung interstitial opacity compatible with viral or atypical respiratory infection. 2. Stable right lung base atelectasis. Electronically Signed   By: Genevie Ann M.D.   On: 05/21/2016 11:13    Cardiac Studies   TTE 03/2016: Study Conclusions  - Left ventricle: The cavity size was mildly dilated. There was mild concentric hypertrophy. Systolic function was normal. The estimated ejection fraction was in the range of 55% to 60%. Wall motion was normal; there were no regional wall motion abnormalities. Features are consistent with a pseudonormal left ventricular filling pattern, with concomitant abnormal relaxation and increased filling pressure (grade 2 diastolic dysfunction). - Mitral valve: Calcified annulus. Severe prolapse, involving the posterior leaflet. There was severe regurgitation directed eccentrically and anteriorly. - Left atrium: The atrium was moderately dilated. - Pulmonary arteries: Systolic pressure was moderately to severely increased. PA peak pressure: 60  mm Hg (S).  Impressions:  - Recommend cardiac evaluation and consider TEE to evaluate mitral valve.  Patient Profile     61 y.o. female with history of severe mitral valve prolapse involving the posterior leaflet with severe anterior mitral regurgitation, COPD on nighttime oxygen at 2 L secondary to prior tobacco smoke exposure, HTN, anxiety, and depression who presented to [facility] on 2/19 for outpatient TEE that was cancelled 2/2 AECOPD requiring admission, subsequently noted to be positive for influenza B.  Assessment & Plan    1. Severe mitral valve prolapse involving the posterior leaflet and severe mitrla regurgitation involving the anterior mitral valve: -TEE on 2/19 cancelled 2/2 AECOPD, admitted to hospital, found to have influenza B -Continued AECOPD with significant wheezing and SOB -Able to wean oxygen supplementation during the day on 2/21, back on home dose of oxygen therapy at nighttime overnight without issues -Will need continued treatment of AECOPD at this time  with holding of planned TEE  -At this time she wants to defer TEE and R/LHC until outpatient which is likely reasonable given her breathing status currently -Plan for outpatient TEE and R/LHC when breathing better  2. AECOPD: -As above -Per IM  3. Pulmonary HTN: -Still SOB -CXR from 2/21 with new left pleural effusion and possible bibasilar atelectasis  -Will stop HCTZ and provide gentle diuresis with PO Lasix -Check bmet this morning -Defer R/LHC as above -Consider stopping IV fluids as she is able to take PO liquids -Remains net negative for the admission  4. Influenza B: -On Tamiflu -Supportive care per IM  5. Sinus tachycardia/atrial tach: -Currently in sinus rhythm with heart rates in the 80's bpm -Was asymptomatic -Attempt to avoid BB given pulmonary status with significant wheezing on exam -If needed consider diltiazem -Check lytes as above -Likely in the setting of her pulmonary status  and frequent breathing treatments  Signed, Christell Faith, PA-C [place] Pager: [phone] 05/22/2016, 7:27 AM

## 2016-05-22 NOTE — Consult Note (Signed)
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 female with a known history of Htn, hypothyroidism, MVP, COPD- was havign cough and SOB for few days , so went to urgent care center- was given inj steroids and oral Doxy on Friday, taking ABx for last 3 days. Today was scheduled appointment with cardiologist for TEE as a work up before valvular replacement surgery.\ Patient flu B +, feels bad, wheezing in both lungs has SOB and cough With fevers and chills   PAST MEDICAL HISTORY :   has a past medical history of Anxiety; Congenital absence of one kidney; COPD (chronic obstructive pulmonary disease) ([place]); Depression; Emphysema/COPD ([place]); History of sciatica; Hypertension; hypothyrodism; Hypothyroidism; Motion sickness; MVP (mitral valve prolapse); PONV (postoperative nausea and vomiting); Pulmonary hypertension; and Sleep apnea.  has a past surgical history that includes Combined hysterectomy abdominal w/ A&P repair / oophorectomy (1996); Inner ear surgery; Abdominal hysterectomy; Breast biopsy (Right, 2011); Cataract extraction w/PHACO (Left, 11/14/2015); Cataract extraction w/PHACO (Right, 12/19/2015); and TEE without cardioversion (N/A, 05/19/2016). Prior to Admission medications   Medication Sig Start Date End Date Taking? Authorizing Provider  albuterol (PROVENTIL HFA;VENTOLIN HFA) 108 (90 Base) MCG/ACT inhaler Inhale 2 puffs into the lungs every 6 (six) hours as needed for wheezing or shortness of breath. Pt uses with SPACER.   Yes Historical Provider, MD  albuterol (PROVENTIL) (2.5 MG/3ML) 0.083% nebulizer solution Take 2.5 mg by nebulization every 6 (six) hours as needed  for wheezing or shortness of breath.   Yes Historical Provider, MD  doxycycline (VIBRAMYCIN) 100 MG capsule Take 1 capsule (100 mg total) by mouth 2 (two) times daily. 05/16/16  Yes Marylene Land, NP  fluticasone (FLONASE) 50 MCG/ACT nasal spray Place 2 sprays into both nostrils daily.   Yes Historical Provider, MD  fluticasone (FLOVENT HFA) 110 MCG/ACT inhaler Inhale 1 puff into the lungs 2 (two) times daily.   Yes Historical Provider, MD  hydrochlorothiazide (HYDRODIURIL) 25 MG tablet TAKE 1/2 TABLET(12.5 MG) BY MOUTH DAILY 10/22/15  Yes [name], MD  hydroxypropyl methylcellulose / hypromellose (ISOPTO TEARS / GONIOVISC) 2.5 % ophthalmic solution Place 1 drop into both eyes 2 (two) times daily.   Yes Historical Provider, MD  levothyroxine (SYNTHROID, LEVOTHROID) 112 MCG tablet Take 112 mcg by mouth daily before breakfast.   Yes Historical Provider, MD  loratadine-pseudoephedrine (CLARITIN-D 24-HOUR) 10-240 MG 24 hr tablet Take 1 tablet by mouth daily.   Yes Historical Provider, MD  LORazepam (ATIVAN) 0.5 MG tablet Take 1 tablet (0.5 mg total) by mouth every 8 (eight) hours as needed for anxiety. 04/18/16 04/18/17 Yes Earleen Newport, MD  losartan (COZAAR) 100 MG tablet TAKE 1 TABLET(100 MG) BY MOUTH DAILY 04/28/16  Yes [name], MD  montelukast (SINGULAIR) 10 MG tablet Take 1 tablet (10 mg total) by mouth at bedtime. 06/05/15  Yes Fritzi Mandes, MD  OXYGEN Inhale 2 L into the lungs at bedtime as needed (at bedtime and as needed).   Yes Historical Provider, MD  polyethylene glycol powder (GLYCOLAX/MIRALAX) powder Take 17 g by mouth 2 (two) times daily as needed. 08/03/15  Yes [name], MD  traMADol Veatrice Bourbon)  50 MG tablet 1-2 tabs po q 6 hr prn pain Maximum dose= 8 tablets per day 11/09/15  Yes Melynda Ripple, MD  umeclidinium-vilanterol [facility] ELLIPTA) 62.5-25 MCG/INH AEPB Inhale 1 puff into the lungs daily. 04/28/16  Yes [name], MD   Allergies  Allergen Reactions  . Codeine  Hives and Nausea And Vomiting  . Penicillins Hives, Itching and Other (See Comments)    Has patient had a PCN reaction causing immediate rash, facial/tongue/throat swelling, SOB or lightheadedness with hypotension: No Has patient had a PCN reaction causing severe rash involving mucus membranes or skin necrosis: No Has patient had a PCN reaction that required hospitalization No Has patient had a PCN reaction occurring within the last 10 years: No If all of the above answers are "NO", then may proceed with Cephalosporin use.  [name] Zithromax [Azithromycin] Other (See Comments)    Reaction:  Hallucinations   . Prednisone Palpitations and Other (See Comments)    Reaction:  Hallucinations     FAMILY HISTORY:  family history includes Breast cancer in her paternal aunt; Coronary artery disease in her father; Heart disease in her brother; Multiple sclerosis in her mother. SOCIAL HISTORY:  reports that she has never smoked. She has never used smokeless tobacco. She reports that she does not drink alcohol or use drugs.   Review of Systems:  Gen:  Generalized weakness and fatigue, ill appearing  HEENT: Denies blurred vision, double vision, ear pain, eye pain, hearing loss, nose bleeds, sore throat  Cardiac:  No dizziness, chest pain or heaviness, chest tightness,edema  Resp:  +shortness of breath,+wheezing, -hemoptysis,   Gi: Denies swallowing difficulty, stomach pain, nausea or vomiting, diarrhea, constipation, bowel incontinence  Gu:  Denies bladder incontinence, burning urine  Ext:   Denies Joint pain, stiffness or swelling  Skin: Denies  skin rash, easy bruising or bleeding or hives  Endoc:  Denies polyuria, polydipsia , polyphagia or weight change  Psych:   Denies depression, insomnia or hallucinations   Other:  All other systems negative   Physical Examination:   GENERAL:+ weakness + fatigue HEAD: Normocephalic, atraumatic.  EYES: Pupils equal, round, reactive to light.  Extraocular muscles intact. No scleral icterus.  MOUTH: Moist mucosal membrane. Dentition intact. No abscess noted.  EAR, NOSE, THROAT: Clear without exudates. No external lesions.  NECK: Supple. No thyromegaly. No nodules. No JVD.  PULMONARY: Diffuse coarse rhonchi b/l   +wheezes CARDIOVASCULAR: S1 and S2. Regular rate and rhythm. No murmurs, rubs, or gallops. No edema. Pedal pulses 2+ bilaterally.  GASTROINTESTINAL: Soft, nontender, nondistended. No masses. Positive bowel sounds. No hepatosplenomegaly.  MUSCULOSKELETAL: No swelling, clubbing, or edema. Range of motion full in all extremities.  NEUROLOGIC: Cranial nerves II through XII are intact. No gross focal neurological deficits. Sensation intact. Reflexes intact.  SKIN: No ulceration, lesions, rashes, or cyanosis. Skin warm and dry. Turgor intact.  PSYCHIATRIC: Mood, affect within normal limits. The patient is awake, alert and oriented x 3. Insight, judgment intact.    ALL OTHER ROS ARE NEGATIVE      VITAL SIGNS: Temp:  [97.7 F (36.5 C)-98.4 F (36.9 C)] 97.7 F (36.5 C) (02/22 0743) Pulse Rate:  [87-102] 94 (02/22 0743) Resp:  [16-22] 20 (02/22 0743) BP: (111-139)/(51-67) 139/54 (02/22 0743) SpO2:  [97 %-99 %] 99 % (02/22 0743) FiO2 (%):  [28 %] 28 % (02/22 0432)  Recent Labs Lab 05/19/16 1308 05/20/16 0534 05/22/16 0751  NA 139 139 141  K 3.9 4.1 3.6  CL 104  105 103  CO2 28 27 31   BUN 18 20 11   CREATININE 0.97 0.84 0.73  GLUCOSE 116* 142* 82    Recent Labs Lab 05/19/16 1308 05/20/16 0534  HGB 11.4* 11.1*  HCT 34.0* 33.3*  WBC 5.0 3.1*  PLT 140* 130*   Dg Chest 2 View  Result Date: 05/21/2016 CLINICAL DATA:  61 year old female diagnosed with influenza. Hypoxia, query superimposed pneumonia. Initial encounter. EXAM: CHEST  2 VIEW COMPARISON:  05/19/2016 and earlier. FINDINGS: Seated AP and lateral views of the chest. Continued low lung volumes and mild respiratory motion today. Platelike opacity at the  right lung base with underlying elevated right hemidiaphragm appears stable. Questionable small left pleural effusion now. Interval increased interstitial opacity in the left lower lung. No other confluent opacity. Stable cardiac size and mediastinal contours. Visualized tracheal air column is within normal limits. No pneumothorax. No acute osseous abnormality identified. IMPRESSION: 1. Possible new small left pleural effusion. Increased left lower lung interstitial opacity compatible with viral or atypical respiratory infection. 2. Stable right lung base atelectasis. Electronically Signed   By: Genevie Ann M.D.   On: 05/21/2016 11:13   I have Independently reviewed images of CXR from 2/21 on  05/22/2016 Interpretation:left lower lobe opacity, +effusion  ASSESSMENT / PLAN: 61 yo white female with end stage COPD with acute resp distress with acute COPD exacerbation from FLU B viral infection in setting of pulm edema with MVP  1.IV steroids 2.lasix as tolerated 3.aggressive BD therapy 4.oxygen as needed 5.abx as prescribed 6.Continue Tamiflu   Patient  satisfied with Plan of action and management. All questions answered  Corrin Parker, M.D.  [facility]  Medical Director [place] Director [place] [facility]

## 2016-05-22 NOTE — Evaluation (Signed)
Physical Therapy Evaluation Patient Details Name: Lauren Bartlett MRN: [id number] DOB: 1955-05-13 Today's Date: 05/22/2016   History of Present Illness  Lauren Bartlett  is a 61 y.o. female with a known history of Htn, hypothyroidism, MVP, COPD- was havign cough and SOB for few days , so went to urgent care center- was given inj steroids and oral Doxy on Friday, taking ABx for last 3 days. Pt + for flu and COPD exacerbation.    Clinical Impression  Pt presents to PT with decreased functional endurance with mobility and ambulation and would benefit from acute PT services to address objective findings.  Pt able to get in and out of bed with ease, but becomes SOB requiring rest breaks between activities.  Pt motivated to get stronger and amb 2 laps around nursing station needing standing rest break about every 50' for 10-15 seconds.     Follow Up Recommendations Home health PT    Equipment Recommendations  None recommended by PT    Recommendations for Other Services       Precautions / Restrictions Precautions Precautions: Fall Precaution Comments: MOD Restrictions Weight Bearing Restrictions: No      Mobility  Bed Mobility Overal bed mobility: Modified Independent             General bed mobility comments: Supine<>sit with HOB flat, able to sit upright into Lauren Bartlett sit and walk legs off bed and rotate hips into short sit position with Mod I   Transfers Overall transfer level: Modified independent Equipment used: Rolling walker (2 wheeled)             General transfer comment: Sit<>stand with Mod I, rising easily on first attempt with RW  Ambulation/Gait Ambulation/Gait assistance: Supervision Ambulation Distance (Feet): 300 Feet Assistive device: Rolling walker (2 wheeled) Gait Pattern/deviations: WFL(Within Functional Limits) Gait velocity: Initially slow but able to increase with distance   General Gait Details: Slow steady gait with RW on 2 L O2 with O2 sats at 100%  post ambulation.  Stairs            Wheelchair Mobility    Modified Rankin (Stroke Patients Only)       Balance Overall balance assessment: Modified Independent                                           Pertinent Vitals/Pain Pain Assessment: No/denies pain    Home Living Family/patient expects to be discharged to:: Private residence Living Arrangements: Alone Available Help at Discharge: Personal care attendant (8:30-11:00 5x/week) Type of Home: Apartment Home Access: Level entry     Home Layout: One level Home Equipment: Walker - 4 wheels;Shower seat;Grab bars - tub/shower;Grab bars - toilet;Cane - single point      Prior Function Level of Independence: Needs assistance   Gait / Transfers Assistance Needed: inde with amb using rollator, goes shopping and holds walking buggy  ADL's / Homemaking Assistance Needed: bath and housework  Comments: drives     Hand Dominance   Dominant Hand: Right    Extremity/Trunk Assessment   Upper Extremity Assessment Upper Extremity Assessment: Overall WFL for tasks assessed    Lower Extremity Assessment Lower Extremity Assessment: Overall WFL for tasks assessed    Cervical / Trunk Assessment Cervical / Trunk Assessment: Normal  Communication   Communication: No difficulties  Cognition Arousal/Alertness: Awake/alert Behavior During Therapy: WFL for tasks assessed/performed Overall  Cognitive Status: Within Functional Limits for tasks assessed                      General Comments General comments (skin integrity, edema, etc.): Left on room air with O2 sats between 93-97%    Exercises Other Exercises Other Exercises: Ambulation 300'   Assessment/Plan    PT Assessment Patient needs continued PT services  PT Problem List Decreased activity tolerance;Cardiopulmonary status limiting activity       PT Treatment Interventions Gait training;Functional mobility training;Therapeutic  activities;Therapeutic exercise    PT Goals (Current goals can be found in the Care Plan section)  Acute Rehab PT Goals Patient Stated Goal: To get stronger PT Goal Formulation: With patient Time For Goal Achievement: 05/29/16 Potential to Achieve Goals: Good    Frequency Min 2X/week   Barriers to discharge Decreased caregiver support lives alone    Co-evaluation               End of Session Equipment Utilized During Treatment: Gait belt;Oxygen Activity Tolerance: Patient tolerated treatment well Patient left: in bed;with call bell/phone within reach;with nursing/sitter in room Nurse Communication: Mobility status PT Visit Diagnosis: Difficulty in walking, not elsewhere classified (R26.2)    Functional Assessment Tool Used: AM-PAC 6 Clicks Basic Mobility Functional Limitation: Mobility: Walking and moving around Mobility: Walking and Moving Around Current Status [id number]): At least 20 percent but less than 40 percent impaired, limited or restricted Mobility: Walking and Moving Around Goal Status [phone]): At least 1 percent but less than 20 percent impaired, limited or restricted    Time: 1140-1214 PT Time Calculation (min) (ACUTE ONLY): 34 min   Charges:   PT Evaluation $PT Eval Low Complexity: 1 Procedure PT Treatments $Therapeutic Exercise: 8-22 mins   PT G Codes:   PT G-Codes **NOT FOR INPATIENT CLASS** Functional Assessment Tool Used: AM-PAC 6 Clicks Basic Mobility Functional Limitation: Mobility: Walking and moving around Mobility: Walking and Moving Around Current Status [id number]): At least 20 percent but less than 40 percent impaired, limited or restricted Mobility: Walking and Moving Around Goal Status [phone]): At least 1 percent but less than 20 percent impaired, limited or restricted     [name], PT 05/22/2016, 12:53 PM

## 2016-05-22 NOTE — Progress Notes (Signed)
[facility] NAME: Lauren Bartlett    MR#:  [id number]  DATE OF BIRTH:  03-08-1956  SUBJECTIVE:  CHIEF COMPLAINT:  No chief complaint on file.   Came to have TEE but found exacerbation of COPD and so admitted to medical services.   She was found to have influenza B and under treatment for that, feels still having wheezing.  said" I am still SOB"  REVIEW OF SYSTEMS:  CONSTITUTIONAL: No fever, fatigue or weakness.  EYES: No blurred or double vision.  EARS, NOSE, AND THROAT: No tinnitus or ear pain.  RESPIRATORY: Positive for cough, shortness of breath, wheezing , no hemoptysis.  CARDIOVASCULAR: No chest pain, orthopnea, edema.  GASTROINTESTINAL: No nausea, vomiting, diarrhea or abdominal pain.  GENITOURINARY: No dysuria, hematuria.  ENDOCRINE: No polyuria, nocturia,  HEMATOLOGY: No anemia, easy bruising or bleeding SKIN: No rash or lesion. MUSCULOSKELETAL: No joint pain or arthritis.   NEUROLOGIC: No tingling, numbness, weakness.  PSYCHIATRY: No anxiety or depression.   ROS  DRUG ALLERGIES:   Allergies  Allergen Reactions  . Codeine Hives and Nausea And Vomiting  . Penicillins Hives, Itching and Other (See Comments)    Has patient had a PCN reaction causing immediate rash, facial/tongue/throat swelling, SOB or lightheadedness with hypotension: No Has patient had a PCN reaction causing severe rash involving mucus membranes or skin necrosis: No Has patient had a PCN reaction that required hospitalization No Has patient had a PCN reaction occurring within the last 10 years: No If all of the above answers are "NO", then may proceed with Cephalosporin use.  [name] Zithromax [Azithromycin] Other (See Comments)    Reaction:  Hallucinations   . Prednisone Palpitations and Other (See Comments)    Reaction:  Hallucinations     VITALS:  Blood pressure 131/61, pulse (!) 103, temperature 98.4 F (36.9 C), temperature source Oral, resp. rate  18, height 5\' 2"  (1.575 m), weight 99.3 kg (219 lb), SpO2 96 %.  PHYSICAL EXAMINATION:  GENERAL:  61 y.o.-year-old patient lying in the bed with no acute distress.  EYES: Pupils equal, round, reactive to light and accommodation. No scleral icterus. Extraocular muscles intact.  HEENT: Head atraumatic, normocephalic. Oropharynx and nasopharynx clear.  NECK:  Supple, no jugular venous distention. No thyroid enlargement, no tenderness.  LUNGS: Normal breath sounds bilaterally, some wheezing, no crepitation. No use of accessory muscles of respiration.  CARDIOVASCULAR: S1, S2 normal. No murmurs, rubs, or gallops.  ABDOMEN: Soft, nontender, nondistended. Bowel sounds present. No organomegaly or mass.  EXTREMITIES: No pedal edema, cyanosis, or clubbing.  NEUROLOGIC: Cranial nerves II through XII are intact. Muscle strength 5/5 in all extremities. Sensation intact. Gait not checked.  PSYCHIATRIC: The patient is alert and oriented x 3.  SKIN: No obvious rash, lesion, or ulcer.   Physical Exam LABORATORY PANEL:   CBC  Recent Labs Lab 05/20/16 0534  WBC 3.1*  HGB 11.1*  HCT 33.3*  PLT 130*   ------------------------------------------------------------------------------------------------------------------  Chemistries   Recent Labs Lab 05/22/16 0751  NA 141  K 3.6  CL 103  CO2 31  GLUCOSE 82  BUN 11  CREATININE 0.73  CALCIUM 7.0*  MG 1.9   ------------------------------------------------------------------------------------------------------------------  Cardiac Enzymes No results for input(s): TROPONINI in the last 168 hours. ------------------------------------------------------------------------------------------------------------------  RADIOLOGY:  Dg Chest 2 View  Result Date: 05/21/2016 CLINICAL DATA:  61 year old female diagnosed with influenza. Hypoxia, query superimposed pneumonia. Initial encounter. EXAM: CHEST  2 VIEW COMPARISON:  05/19/2016 and  earlier. FINDINGS:  Seated AP and lateral views of the chest. Continued low lung volumes and mild respiratory motion today. Platelike opacity at the right lung base with underlying elevated right hemidiaphragm appears stable. Questionable small left pleural effusion now. Interval increased interstitial opacity in the left lower lung. No other confluent opacity. Stable cardiac size and mediastinal contours. Visualized tracheal air column is within normal limits. No pneumothorax. No acute osseous abnormality identified. IMPRESSION: 1. Possible new small left pleural effusion. Increased left lower lung interstitial opacity compatible with viral or atypical respiratory infection. 2. Stable right lung base atelectasis. Electronically Signed   By: Genevie Ann M.D.   On: 05/21/2016 11:13    ASSESSMENT AND PLAN:   Principal Problem:   COPD exacerbation (HCC) Active Problems:   Severe mitral regurgitation by prior echocardiogram   Pulmonary hypertension (HCC)   COPD (chronic obstructive pulmonary disease) (HCC)   Influenza B   Hypoxia  * Ac COPD exacerbation   IV steroids, nebs, levaquin.   Spiriva, Duoneb, and inhaled steroids.   Pro-calcitonin is low so stop Levaquin.  Xray chest shows some atelactesis , may be influenza related pneumonitis. Cont to have c/o SOB and wheezing. Called pulm consult.   Also seems having component of CHF due to MVP - added IV lasix.  * MVP with ac diastolic CHF  IV lasix   [place] per cardio, currently hold work ups due to COPD.   May re-schedule TEE and cath once pt improve, suggesting as out pt.  * hypothyroidism   Cont levothyroxine.  * Htn   Losartan, HCTZ.  * Influenza B   tamiflu.     All the records are reviewed and case discussed with Care Management/Social Workerr. Management plans discussed with the patient, family and they are in agreement.  CODE STATUS: Full.  TOTAL TIME TAKING CARE OF THIS PATIENT: 35 minutes.     POSSIBLE D/C IN 1-2 DAYS, DEPENDING ON  CLINICAL CONDITION.   Vaughan Basta M.D on 05/22/2016   Between 7am to 6pm - Pager - [phone]  After 6pm go to [email or web address] - password [email or web address] [facility]  Office  [phone]  CC: Primary care physician; [name], MD  Note: This dictation was prepared with Dragon dictation along with smaller phrase technology. Any transcriptional errors that result from this process are unintentional.

## 2016-05-23 ENCOUNTER — Ambulatory Visit: Payer: Medicare Other | Admitting: Internal Medicine

## 2016-05-23 DIAGNOSIS — I340 Nonrheumatic mitral (valve) insufficiency: Secondary | ICD-10-CM | POA: Diagnosis not present

## 2016-05-23 DIAGNOSIS — Z8249 Family history of ischemic heart disease and other diseases of the circulatory system: Secondary | ICD-10-CM | POA: Diagnosis not present

## 2016-05-23 DIAGNOSIS — I5031 Acute diastolic (congestive) heart failure: Secondary | ICD-10-CM | POA: Diagnosis not present

## 2016-05-23 DIAGNOSIS — Z803 Family history of malignant neoplasm of breast: Secondary | ICD-10-CM | POA: Diagnosis not present

## 2016-05-23 DIAGNOSIS — G4730 Sleep apnea, unspecified: Secondary | ICD-10-CM | POA: Diagnosis not present

## 2016-05-23 DIAGNOSIS — J9621 Acute and chronic respiratory failure with hypoxia: Secondary | ICD-10-CM | POA: Diagnosis not present

## 2016-05-23 DIAGNOSIS — E039 Hypothyroidism, unspecified: Secondary | ICD-10-CM | POA: Diagnosis not present

## 2016-05-23 DIAGNOSIS — Z9981 Dependence on supplemental oxygen: Secondary | ICD-10-CM | POA: Diagnosis not present

## 2016-05-23 DIAGNOSIS — I272 Pulmonary hypertension, unspecified: Secondary | ICD-10-CM | POA: Diagnosis not present

## 2016-05-23 DIAGNOSIS — I341 Nonrheumatic mitral (valve) prolapse: Secondary | ICD-10-CM | POA: Diagnosis not present

## 2016-05-23 DIAGNOSIS — I110 Hypertensive heart disease with heart failure: Secondary | ICD-10-CM | POA: Diagnosis not present

## 2016-05-23 DIAGNOSIS — R000 Tachycardia, unspecified: Secondary | ICD-10-CM | POA: Diagnosis not present

## 2016-05-23 DIAGNOSIS — J441 Chronic obstructive pulmonary disease with (acute) exacerbation: Secondary | ICD-10-CM | POA: Diagnosis not present

## 2016-05-23 LAB — PROCALCITONIN: Procalcitonin: <0.1 ng/mL

## 2016-05-23 MED ORDER — LORATADINE 10 MG PO TABS: 10.0000 mg | ORAL_TABLET | Freq: Every day | ORAL | 0 refills | Status: DC

## 2016-05-23 MED ORDER — PSEUDOEPHEDRINE HCL ER 120 MG PO TB12: 120.0000 mg | ORAL_TABLET | Freq: Two times a day (BID) | ORAL | 0 refills | Status: AC

## 2016-05-23 MED ORDER — OSELTAMIVIR PHOSPHATE 75 MG PO CAPS: 75.0000 mg | ORAL_CAPSULE | Freq: Two times a day (BID) | ORAL | 0 refills | Status: AC

## 2016-05-23 MED ORDER — FUROSEMIDE 20 MG PO TABS: 20.0000 mg | ORAL_TABLET | Freq: Every day | ORAL | 0 refills | Status: DC

## 2016-05-23 MED ORDER — BUDESONIDE 0.5 MG/2ML IN SUSP: 0.5000 mg | Freq: Two times a day (BID) | RESPIRATORY_TRACT | 0 refills | Status: DC

## 2016-05-23 NOTE — Discharge Summary (Signed)
Lauren Bartlett at [place] NAME: Lauren Bartlett    MR#:  [id number]  DATE OF BIRTH:  07/13/1955  DATE OF ADMISSION:  05/19/2016 ADMITTING PHYSICIAN: Wellington Hampshire, MD  DATE OF DISCHARGE: 05/23/2016  PRIMARY CARE PHYSICIAN: [name], MD    ADMISSION DIAGNOSIS:  TEE    Mitral valve disease   DISCHARGE DIAGNOSIS:  Principal Problem:   COPD exacerbation ([place]) Active Problems:   Severe mitral regurgitation by prior echocardiogram   Pulmonary hypertension (H[facility])   COPD (chronic obstructive pulmonary disease) ([place])   Influenza B   Hypoxia   SECONDARY DIAGNOSIS:   Past Medical History:  Diagnosis Date  . Anxiety   . Congenital absence of one kidney   . COPD (chronic obstructive pulmonary disease) ([place])   . Depression    treated at [facility]  . Emphysema/COPD ([place])   . History of sciatica   . Hypertension   . hypothyrodism   . Hypothyroidism   . Motion sickness    cars  . MVP (mitral valve prolapse)   . PONV (postoperative nausea and vomiting)   . Pulmonary hypertension   . Sleep apnea    O2 at night and PRN    HOSPITAL COURSE:   * Ac COPD exacerbation IV steroids, nebs, levaquin. Spiriva, Duoneb, and inhaled steroids.   Pro-calcitonin is low so stop Levaquin.  Xray chest shows some atelactesis , may be influenza related pneumonitis. Cont to have c/o SOB and wheezing. Called pulm consult.   Also seems having component of CHF due to MVP - added IV lasix.   Feels better now, less wheezing.  * MVP with ac diastolic CHF  IV lasix- switch to oral on d./c [place] per cardio, currently hold work ups due to COPD. May re-schedule TEE and cath once pt improve, suggesting as out pt.  * hypothyroidism Cont levothyroxine.  * Htn Losartan, HCTZ.  * Influenza B   tamiflu.   DISCHARGE CONDITIONS:   Stable.  CONSULTS OBTAINED:  Treatment Team:  Flora Lipps, MD  DRUG ALLERGIES:   Allergies   Allergen Reactions  . Codeine Hives and Nausea And Vomiting  . Penicillins Hives, Itching and Other (See Comments)    Has patient had a PCN reaction causing immediate rash, facial/tongue/throat swelling, SOB or lightheadedness with hypotension: No Has patient had a PCN reaction causing severe rash involving mucus membranes or skin necrosis: No Has patient had a PCN reaction that required hospitalization No Has patient had a PCN reaction occurring within the last 10 years: No If all of the above answers are "NO", then may proceed with Cephalosporin use.  [name] Zithromax [Azithromycin] Other (See Comments)    Reaction:  Hallucinations   . Prednisone Palpitations and Other (See Comments)    Reaction:  Hallucinations     DISCHARGE MEDICATIONS:   Current Discharge Medication List    START taking these medications   Details  budesonide (PULMICORT) 0.5 MG/2ML nebulizer solution Take 2 mLs (0.5 mg total) by nebulization 2 (two) times daily. Qty: 50 mL, Refills: 0    furosemide (LASIX) 20 MG tablet Take 1 tablet (20 mg total) by mouth daily. Qty: 4 tablet, Refills: 0    loratadine (CLARITIN) 10 MG tablet Take 1 tablet (10 mg total) by mouth daily. Qty: 10 tablet, Refills: 0    oseltamivir (TAMIFLU) 75 MG capsule Take 1 capsule (75 mg total) by mouth 2 (two) times daily. Qty: 4 capsule, Refills: 0  pseudoephedrine (SUDAFED) 120 MG 12 hr tablet Take 1 tablet (120 mg total) by mouth 2 (two) times daily. Qty: 8 tablet, Refills: 0      CONTINUE these medications which have NOT CHANGED   Details  albuterol (PROVENTIL HFA;VENTOLIN HFA) 108 (90 Base) MCG/ACT inhaler Inhale 2 puffs into the lungs every 6 (six) hours as needed for wheezing or shortness of breath. Pt uses with SPACER.    albuterol (PROVENTIL) (2.5 MG/3ML) 0.083% nebulizer solution Take 2.5 mg by nebulization every 6 (six) hours as needed for wheezing or shortness of breath.    fluticasone (FLONASE) 50 MCG/ACT nasal spray Place  2 sprays into both nostrils daily.    fluticasone (FLOVENT HFA) 110 MCG/ACT inhaler Inhale 1 puff into the lungs 2 (two) times daily.    hydrochlorothiazide (HYDRODIURIL) 25 MG tablet TAKE 1/2 TABLET(12.5 MG) BY MOUTH DAILY Qty: 45 tablet, Refills: 1    hydroxypropyl methylcellulose / hypromellose (ISOPTO TEARS / GONIOVISC) 2.5 % ophthalmic solution Place 1 drop into both eyes 2 (two) times daily.    levothyroxine (SYNTHROID, LEVOTHROID) 112 MCG tablet Take 112 mcg by mouth daily before breakfast.    loratadine-pseudoephedrine (CLARITIN-D 24-HOUR) 10-240 MG 24 hr tablet Take 1 tablet by mouth daily.    LORazepam (ATIVAN) 0.5 MG tablet Take 1 tablet (0.5 mg total) by mouth every 8 (eight) hours as needed for anxiety. Qty: 30 tablet, Refills: 0    losartan (COZAAR) 100 MG tablet TAKE 1 TABLET(100 MG) BY MOUTH DAILY Qty: 30 tablet, Refills: 0    montelukast (SINGULAIR) 10 MG tablet Take 1 tablet (10 mg total) by mouth at bedtime. Qty: 30 tablet, Refills: 3    OXYGEN Inhale 2 L into the lungs at bedtime as needed (at bedtime and as needed).    polyethylene glycol powder (GLYCOLAX/MIRALAX) powder Take 17 g by mouth 2 (two) times daily as needed. Qty: 3350 g, Refills: 1    traMADol (ULTRAM) 50 MG tablet 1-2 tabs po q 6 hr prn pain Maximum dose= 8 tablets per day Qty: 20 tablet, Refills: 0    umeclidinium-vilanterol (ANORO ELLIPTA) 62.5-25 MCG/INH AEPB Inhale 1 puff into the lungs daily. Qty: 1 each, Refills: 11      STOP taking these medications     doxycycline (VIBRAMYCIN) 100 MG capsule          DISCHARGE INSTRUCTIONS:    Follow with PMD in 1 week.\Cardiology to arrange for Out pt appointment for TEE and cath,  If you experience worsening of your admission symptoms, develop shortness of breath, life threatening emergency, suicidal or homicidal thoughts you must seek medical attention immediately by calling 911 or calling your MD immediately  if symptoms less severe.  You  Must read complete instructions/literature along with all the possible adverse reactions/side effects for all the Medicines you take and that have been prescribed to you. Take any new Medicines after you have completely understood and accept all the possible adverse reactions/side effects.   Please note  You were cared for by a hospitalist during your hospital stay. If you have any questions about your discharge medications or the care you received while you were in the hospital after you are discharged, you can call the unit and asked to speak with the hospitalist on call if the hospitalist that took care of you is not available. Once you are discharged, your primary care physician will handle any further medical issues. Please note that NO REFILLS for any discharge medications will be authorized once  you are discharged, as it is imperative that you return to your primary care physician (or establish a relationship with a primary care physician if you do not have one) for your aftercare needs so that they can reassess your need for medications and monitor your lab values.    Today   CHIEF COMPLAINT:  No chief complaint on file.   HISTORY OF PRESENT ILLNESS:  Lauren Bartlett  is a 61 y.o. female with a known history of Htn, hypothyroidism, MVP, COPD- was havign cough and SOB for few days , so went to urgent care center- was given inj steroids and oral Doxy on Friday, taking ABx for last 3 days. Today was scheduled appointment with cardiologist for TEE as a work up before valvular replacement surgery.\ When she came, she had wheezing, so [name] suggested to admit to medicine and cancelled the procedure.  VITAL SIGNS:  Blood pressure (!) 120/57, pulse 99, temperature 97.5 F (36.4 C), temperature source Oral, resp. rate 19, height 5\' 2"  (1.575 m), weight 99.3 kg (219 lb), SpO2 92 %.  I/O:   Intake/Output Summary (Last 24 hours) at 05/23/16 1017 Last data filed at 05/23/16 0500  Gross per 24  hour  Intake              480 ml  Output             2900 ml  Net            -2420 ml    PHYSICAL EXAMINATION:  GENERAL:  61 y.o.-year-old patient lying in the bed with no acute distress.  EYES: Pupils equal, round, reactive to light and accommodation. No scleral icterus. Extraocular muscles intact.  HEENT: Head atraumatic, normocephalic. Oropharynx and nasopharynx clear.  NECK:  Supple, no jugular venous distention. No thyroid enlargement, no tenderness.  LUNGS: Normal breath sounds bilaterally, mild wheezing,no crepitation. No use of accessory muscles of respiration.  CARDIOVASCULAR: S1, S2 normal. Murmur present.  ABDOMEN: Soft, non-tender, non-distended. Bowel sounds present. No organomegaly or mass.  EXTREMITIES: No pedal edema, cyanosis, or clubbing.  NEUROLOGIC: Cranial nerves II through XII are intact. Muscle strength 5/5 in all extremities. Sensation intact. Gait not checked.  PSYCHIATRIC: The patient is alert and oriented x 3.  SKIN: No obvious rash, lesion, or ulcer.   DATA REVIEW:   CBC  Recent Labs Lab 05/20/16 0534  WBC 3.1*  HGB 11.1*  HCT 33.3*  PLT 130*    Chemistries   Recent Labs Lab 05/22/16 0751  NA 141  K 3.6  CL 103  CO2 31  GLUCOSE 82  BUN 11  CREATININE 0.73  CALCIUM 7.0*  MG 1.9    Cardiac Enzymes No results for input(s): TROPONINI in the last 168 hours.  Microbiology Results  Results for orders placed or performed during the hospital encounter of 02/26/16  Rapid Influenza A&B Antigens [facility] only)     Status: None   Collection Time: 02/27/16  1:13 AM  Result Value Ref Range Status   Influenza A ([facility]) NEGATIVE NEGATIVE Final   Influenza B [facility]) NEGATIVE NEGATIVE Final    RADIOLOGY:  Dg Chest 2 View  Result Date: 05/21/2016 CLINICAL DATA:  61 year old female diagnosed with influenza. Hypoxia, query superimposed pneumonia. Initial encounter. EXAM: CHEST  2 VIEW COMPARISON:  05/19/2016 and earlier. FINDINGS: Seated AP and lateral  views of the chest. Continued low lung volumes and mild respiratory motion today. Platelike opacity at the right lung base with underlying elevated right hemidiaphragm appears  stable. Questionable small left pleural effusion now. Interval increased interstitial opacity in the left lower lung. No other confluent opacity. Stable cardiac size and mediastinal contours. Visualized tracheal air column is within normal limits. No pneumothorax. No acute osseous abnormality identified. IMPRESSION: 1. Possible new small left pleural effusion. Increased left lower lung interstitial opacity compatible with viral or atypical respiratory infection. 2. Stable right lung base atelectasis. Electronically Signed   By: Genevie Ann M.D.   On: 05/21/2016 11:13    EKG:   Orders placed or performed in visit on 05/13/16  . EKG 12-Lead      Management plans discussed with the patient, family and they are in agreement.  CODE STATUS:     Code Status Orders        Start     Ordered   05/19/16 1049  Full code  Continuous     05/19/16 1048    Code Status History    Date Active Date Inactive Code Status Order ID Comments User Context   09/04/2015 10:25 PM 09/08/2015  1:36 PM Full Code [id number]  Demetrios Loll, MD Inpatient   07/27/2015  6:07 PM 08/01/2015  6:43 PM Full Code [id number]  Lytle Butte, MD ED   06/03/2015  1:42 PM 06/05/2015  5:01 PM Full Code [id number]  Idelle Crouch, MD Inpatient   02/16/2015  9:36 AM 02/18/2015  4:33 PM Full Code [id number]  Fritzi Mandes, MD Inpatient   02/03/2015  2:32 AM 02/05/2015  4:34 PM Full Code [id number]  Lytle Butte, MD ED      TOTAL TIME TAKING CARE OF THIS PATIENT: 35 minutes.    Vaughan Basta M.D on 05/23/2016 at 10:17 AM  Between 7am to 6pm - Pager - [phone]  After 6pm go to [email or web address] - password [email or web address] [place]  Office  [phone]  CC: Primary care physician; [name], MD   Note: This dictation was prepared with Dragon  dictation along with smaller phrase technology. Any transcriptional errors that result from this process are unintentional.

## 2016-05-23 NOTE — Care Management (Signed)
Patient to discharge home today.  Patient lives at home alone.  PCP [place].  Patient has [facility] that comes out to her home 2 hours a day M-F.  Chronic nocturnal O2 through [employer].  PT has assessed patient and recommended home health PT.  Patient has used [facility] in the past and is agreement to utilize their services again.  RNCM signing off

## 2016-05-23 NOTE — Progress Notes (Signed)
Patient discharge teaching given, including activity, diet, follow-up appoints, and medications. Patient verbalized understanding of all discharge instructions. IV access  And central telemetry was d/c'd. Vitals are stable. Skin is intact except as charted in most recent assessments. Pt to be escorted out by NT, to be driven home by family.  Lauren Bartlett [employer]

## 2016-05-23 NOTE — Care Management Important Message (Signed)
Important Message  Patient Details  Name: Lauren Bartlett MRN: [id number] Date of Birth: [birth year]   Medicare Important Message Given:  Yes    Beverly Sessions, RN 05/23/2016, 10:50 AM

## 2016-05-26 ENCOUNTER — Other Ambulatory Visit: Payer: Self-pay | Admitting: Internal Medicine

## 2016-05-26 ENCOUNTER — Telehealth: Payer: Self-pay | Admitting: Cardiovascular Disease

## 2016-05-26 ENCOUNTER — Telehealth: Payer: Self-pay | Admitting: Internal Medicine

## 2016-05-26 DIAGNOSIS — R531 Weakness: Secondary | ICD-10-CM | POA: Diagnosis not present

## 2016-05-26 DIAGNOSIS — J441 Chronic obstructive pulmonary disease with (acute) exacerbation: Secondary | ICD-10-CM | POA: Diagnosis not present

## 2016-05-26 NOTE — Telephone Encounter (Signed)
Pt calling to reschedule her TEE and cath Please call back

## 2016-05-26 NOTE — Telephone Encounter (Signed)
Notified pt I am awaiting response from [name] regarding rescheduling TEE and cardiac cath.

## 2016-05-26 NOTE — Telephone Encounter (Signed)
Pt recently admitted for COPD exacerbation. Will discuss with [name] before rescheduling TEE and cath.  Routed to MD

## 2016-05-26 NOTE — Telephone Encounter (Signed)
Transition Care Management Follow-up Telephone Call  How have you been since you were released from the hospital? "Lauren Bartlett but just can't talk my voice comes and goes." denies SOB at this time.   Do you understand why you were in the hospital? Yes, I had the flu,    Do you understand the discharge instrcutions? Yes  Items Reviewed:  Medications reviewed: Yes, patient is not using Pulmicort as prescribed patient is afraid she will feel funny taking medication or have adverse reaction.. Lasix D/C and started HCTZ  12.5 mg daily.  Allergies reviewed: yes}  Dietary changes reviewed:Yes  Referrals reviewed: Yes, cardiology.   Functional Questionnaire:   Activities of Daily Living (ADLs):   She states they are independent in the following: Patient can dress by herself at times.  Prepares lite meals. .She states they are dependent in the following: house work , showering , washing hair , requires assist from facility [facility] by an [place].   Any transportation issues/concerns?:No   Any patient concerns? Yes, patient needs order for new walker the breaks are wearing out on the one she has.   Confirmed importance and date/time of follow-up visits scheduled:yes.   Confirmed with patient if condition begins to worsen call PCP or go to the ER.  Patient was given the Call-a-Nurse line [phone]: Yes

## 2016-05-27 ENCOUNTER — Telehealth: Payer: Self-pay | Admitting: Internal Medicine

## 2016-05-27 NOTE — Telephone Encounter (Signed)
Lauren Bartlett from [facility] care called and stated that he saw pt for a PT evaluation. Pt would benefit from pt for  2x week for 2 weeks and  1x for 3 weeks. Please advise, thank you!  Call @ [phone]

## 2016-05-27 NOTE — Telephone Encounter (Signed)
Verbal auth given

## 2016-05-28 NOTE — Telephone Encounter (Signed)
Per [name], pt needs OV before rescheduling TEE and cardiac cath. Offered pt March 2 appt but she declines, stating she has many other things that day. Scheduled March 8, 3:15pm. Pt agreeable.

## 2016-05-29 DIAGNOSIS — R531 Weakness: Secondary | ICD-10-CM | POA: Diagnosis not present

## 2016-05-29 DIAGNOSIS — J441 Chronic obstructive pulmonary disease with (acute) exacerbation: Secondary | ICD-10-CM | POA: Diagnosis not present

## 2016-05-30 ENCOUNTER — Ambulatory Visit: Payer: Medicare Other | Admitting: Cardiovascular Disease

## 2016-05-30 ENCOUNTER — Encounter: Payer: Self-pay | Admitting: Internal Medicine

## 2016-05-30 ENCOUNTER — Ambulatory Visit (INDEPENDENT_AMBULATORY_CARE_PROVIDER_SITE_OTHER): Payer: Medicare Other | Admitting: Internal Medicine

## 2016-05-30 DIAGNOSIS — Z09 Encounter for follow-up examination after completed treatment for conditions other than malignant neoplasm: Secondary | ICD-10-CM

## 2016-05-30 DIAGNOSIS — I272 Pulmonary hypertension, unspecified: Secondary | ICD-10-CM

## 2016-05-30 DIAGNOSIS — I340 Nonrheumatic mitral (valve) insufficiency: Secondary | ICD-10-CM | POA: Diagnosis not present

## 2016-05-30 DIAGNOSIS — J101 Influenza due to other identified influenza virus with other respiratory manifestations: Secondary | ICD-10-CM | POA: Diagnosis not present

## 2016-05-30 NOTE — Progress Notes (Signed)
Subjective:  Patient ID: Lauren Bartlett, female    DOB: 06/22/1955  Age: 61 y.o. MRN: [id number]  CC: Diagnoses of Hospital discharge follow-up, Pulmonary hypertension ([place]), Severe mitral regurgitation by prior echocardiogram, and Influenza B were pertinent to this visit.  HPI: Lauren Bartlett presents for hospital follow up.  Patient was admitted to [facility] on Feb 19th when she presented for scheduled TEE with acute respiratory failure secondary to influenza  and superimposed pneumonia.  She was treated with empiric antibiotics, antivirals and inhaled bronchodilators.   She was discharged home on Feb 23 with  multiple inhaled medications including Pulmicort nebs, bid,   Flovent MDI,   And Anoro.  She has moderate/severe obstructive airways disease without BD response by Jan 2018 PFTS (read by [facility]) .  The TEE has been postponed.    Her Cough has resolved. [name]  Appetite is improving from poor She is wearing supplemental oxygen at night.   Has PT coming to her home to strengthen her legs.  She is in need of a new walker because the brakes on her current rolling walker do not work .  Has a follow up with   [name] next Thursday to reschedule the TEE in preparation for mitral valve repair surgery, which she is anxious to have so she can resume a relatively active life. which she has been unable to resume .  She had demonstrated the ability to lose weight with a regular walking program,  But since  June 2017 has been plagued with exertional dyspnea  due to progressive dyspnea with any exertion and recurrent lower respiratory infections .    Outpatient Medications Prior to Visit  Medication Sig Dispense Refill  . albuterol (PROVENTIL HFA;VENTOLIN HFA) 108 (90 Base) MCG/ACT inhaler Inhale 2 puffs into the lungs every 6 (six) hours as needed for wheezing or shortness of breath. Pt uses with SPACER.    [name] albuterol (PROVENTIL) (2.5 MG/3ML) 0.083% nebulizer solution Take 2.5 mg by nebulization every 6 (six) hours as  needed for wheezing or shortness of breath.    . budesonide (PULMICORT) 0.5 MG/2ML nebulizer solution Take 2 mLs (0.5 mg total) by nebulization 2 (two) times daily. 50 mL 0  . fluticasone (FLONASE) 50 MCG/ACT nasal spray Place 2 sprays into both nostrils daily.    . fluticasone (FLOVENT HFA) 110 MCG/ACT inhaler Inhale 1 puff into the lungs 2 (two) times daily.    . hydrochlorothiazide (HYDRODIURIL) 25 MG tablet TAKE 1/2 TABLET(12.5 MG) BY MOUTH DAILY 45 tablet 1  . hydroxypropyl methylcellulose / hypromellose (ISOPTO TEARS / GONIOVISC) 2.5 % ophthalmic solution Place 1 drop into both eyes 2 (two) times daily.    [name] levothyroxine (SYNTHROID, LEVOTHROID) 112 MCG tablet Take 112 mcg by mouth daily before breakfast.    . loratadine-pseudoephedrine (CLARITIN-D 24-HOUR) 10-240 MG 24 hr tablet Take 1 tablet by mouth daily.    [name] LORazepam (ATIVAN) 0.5 MG tablet Take 1 tablet (0.5 mg total) by mouth every 8 (eight) hours as needed for anxiety. 30 tablet 0  . losartan (COZAAR) 100 MG tablet TAKE 1 TABLET(100 MG) BY MOUTH DAILY 30 tablet 5  . montelukast (SINGULAIR) 10 MG tablet Take 1 tablet (10 mg total) by mouth at bedtime. 30 tablet 3  . OXYGEN Inhale 2 L into the lungs at bedtime as needed (at bedtime and as needed).    . polyethylene glycol powder (GLYCOLAX/MIRALAX) powder Take 17 g by mouth 2 (two) times daily as needed. 3350 g 1  .  traMADol (ULTRAM) 50 MG tablet 1-2 tabs po q 6 hr prn pain Maximum dose= 8 tablets per day 20 tablet 0  . umeclidinium-vilanterol (ANORO ELLIPTA) 62.5-25 MCG/INH AEPB Inhale 1 puff into the lungs daily. 1 each 11   Facility-Administered Medications Prior to Visit  Medication Dose Route Frequency Provider Last Rate Last Dose  . methylPREDNISolone acetate (DEPO-MEDROL) injection (RADIOLOGY ONLY) 80 mg  80 mg Intramuscular Once Burnard Hawthorne, FNP        Review of Systems;  Patient denies headache, fevers, malaise, unintentional weight loss, skin rash, eye pain, sinus  congestion and sinus pain, sore throat, dysphagia,  hemoptysis , cough,  wheezing, chest pain, palpitations, orthopnea, edema, abdominal pain, nausea, melena, diarrhea, constipation, flank pain, dysuria, hematuria, urinary  Frequency, nocturia, numbness, tingling, seizures,  Focal weakness, Loss of consciousness,  Tremor, insomnia, depression, anxiety, and suicidal ideation.      Objective:  BP 110/68   Pulse 96   Temp 98.1 F (36.7 C) (Oral)   Resp 16   Wt 213 lb (96.6 kg)   SpO2 97%   BMI 38.96 kg/m   BP Readings from Last 3 Encounters:  05/30/16 110/68  05/23/16 (!) 120/57  05/16/16 126/78    Wt Readings from Last 3 Encounters:  05/30/16 213 lb (96.6 kg)  05/19/16 219 lb (99.3 kg)  05/16/16 219 lb (99.3 kg)    General appearance: alert, cooperative and appears stated age Ears: normal TM's and external ear canals both ears Throat: lips, mucosa, and tongue normal; teeth and gums normal Neck: no adenopathy, no carotid bruit, supple, symmetrical, trachea midline and thyroid not enlarged, symmetric, no tenderness/mass/nodules Back: symmetric, no curvature. ROM normal. No CVA tenderness. Lungs: clear to auscultation bilaterally Heart: regular rate and rhythm, S1, S2 normal, no murmur, click, rub or gallop Abdomen: soft, non-tender; bowel sounds normal; no masses,  no organomegaly Pulses: 2+ and symmetric Skin: Skin color, texture, turgor normal. No rashes or lesions Lymph nodes: Cervical, supraclavicular, and axillary nodes normal.  Lab Results  Component Value Date   HGBA1C 5.4 07/28/2015    Lab Results  Component Value Date   CREATININE 0.73 05/22/2016   CREATININE 0.84 05/20/2016   CREATININE 0.97 05/19/2016    Lab Results  Component Value Date   WBC 3.1 (L) 05/20/2016   HGB 11.1 (L) 05/20/2016   HCT 33.3 (L) 05/20/2016   PLT 130 (L) 05/20/2016   GLUCOSE 82 05/22/2016   CHOL 173 03/14/2015   TRIG 85.0 03/14/2015   HDL 57.10 03/14/2015   LDLCALC 99  03/14/2015   ALT 13 01/04/2016   AST 16 01/04/2016   NA 141 05/22/2016   K 3.6 05/22/2016   CL 103 05/22/2016   CREATININE 0.73 05/22/2016   BUN 11 05/22/2016   CO2 31 05/22/2016   TSH 1.02 03/14/2015   INR 1.0 05/13/2016   HGBA1C 5.4 07/28/2015    No results found.  Assessment & Plan:   Problem List Items Addressed This Visit    Hospital discharge follow-up    Patient is stable post discharge and has no new issues or questions about discharge plans at the visit today for hospital follow up except about her medications.  Duplication of inhaled steroids was  Noted,  So she was advised to suspend flovent and continue using pulmicort nebs for the next month. . All labs , imaging studies and progress notes from admission were reviewed with patient today        Influenza B  Symptoms have now resolved .  She completed Tamiflu during recent hospitalization      Pulmonary hypertension [facility])    confirmed with repeat  with ECHO  Done Jan 2017. Secondary to severe mitral regurgitation, untreated OSA, and moderate to severe obstructive airways disease        Severe mitral regurgitation by prior echocardiogram    She has diminished quality of life due to severe MR and is awaiting further workup to prepare for valvular repair.          I am having Ms. Nop maintain her loratadine-pseudoephedrine, montelukast, levothyroxine, polyethylene glycol powder, albuterol, fluticasone, fluticasone, hydrochlorothiazide, OXYGEN, traMADol, albuterol, LORazepam, umeclidinium-vilanterol, hydroxypropyl methylcellulose / hypromellose, budesonide, and losartan. We will continue to administer methylPREDNISolone acetate.  No orders of the defined types were placed in this encounter.   There are no discontinued medications.  Follow-up: Return in about 3 months (around 08/30/2016).   [name], MD

## 2016-05-30 NOTE — Patient Instructions (Addendum)
Your lungs sound much better.    You are on several breathing medications that are too similar:  Use the pulmicort twice daily but Do not refill the pulmicort once you finish your current supply  Suspend the flovent  For now,  and resume it twice daily once you finish the Pulmocort   Continue using albuterol IF NEEDED for wheezing (not more than every 6 hours)  Continue using ANORO (per Dr Alva Garnet)

## 2016-05-30 NOTE — Progress Notes (Signed)
Pre visit review using our clinic review tool, if applicable. No additional management support is needed unless otherwise documented below in the visit note. 

## 2016-06-01 NOTE — Assessment & Plan Note (Signed)
She has diminished quality of life due to severe MR and is awaiting further workup to prepare for valvular repair.

## 2016-06-01 NOTE — Assessment & Plan Note (Signed)
confirmed with repeat  with ECHO  Done Jan 2017. Secondary to severe mitral regurgitation, untreated OSA, and moderate to severe obstructive airways disease

## 2016-06-01 NOTE — Assessment & Plan Note (Signed)
Patient is stable post discharge and has no new issues or questions about discharge plans at the visit today for hospital follow up except about her medications.  Duplication of inhaled steroids was  Noted,  So she was advised to suspend flovent and continue using pulmicort nebs for the next month. . All labs , imaging studies and progress notes from admission were reviewed with patient today

## 2016-06-01 NOTE — Assessment & Plan Note (Signed)
Symptoms have now resolved .  She completed Tamiflu during recent hospitalization

## 2016-06-02 DIAGNOSIS — R531 Weakness: Secondary | ICD-10-CM | POA: Diagnosis not present

## 2016-06-02 DIAGNOSIS — J441 Chronic obstructive pulmonary disease with (acute) exacerbation: Secondary | ICD-10-CM | POA: Diagnosis not present

## 2016-06-05 ENCOUNTER — Other Ambulatory Visit
Admission: RE | Admit: 2016-06-05 | Discharge: 2016-06-05 | Disposition: A | Discharge status: Home / Self Care | Payer: Medicare Other | Source: Ambulatory Visit | Attending: Cardiovascular Disease | Admitting: Cardiovascular Disease

## 2016-06-05 ENCOUNTER — Encounter: Payer: Self-pay | Admitting: Cardiovascular Disease

## 2016-06-05 ENCOUNTER — Ambulatory Visit (INDEPENDENT_AMBULATORY_CARE_PROVIDER_SITE_OTHER): Payer: Medicare Other | Admitting: Cardiovascular Disease

## 2016-06-05 VITALS — BP 132/74 | HR 93 | Ht 62.0 in | Wt 220.0 lb

## 2016-06-05 DIAGNOSIS — Z01812 Encounter for preprocedural laboratory examination: Secondary | ICD-10-CM

## 2016-06-05 DIAGNOSIS — I38 Endocarditis, valve unspecified: Secondary | ICD-10-CM | POA: Diagnosis not present

## 2016-06-05 DIAGNOSIS — I2722 Pulmonary hypertension due to left heart disease: Secondary | ICD-10-CM

## 2016-06-05 DIAGNOSIS — I340 Nonrheumatic mitral (valve) insufficiency: Secondary | ICD-10-CM | POA: Diagnosis not present

## 2016-06-05 DIAGNOSIS — J449 Chronic obstructive pulmonary disease, unspecified: Secondary | ICD-10-CM | POA: Diagnosis not present

## 2016-06-05 LAB — BASIC METABOLIC PANEL
ANION GAP: 7 (ref 5–15)
BUN: 19 mg/dL (ref 6–20)
CALCIUM: 8.1 mg/dL — AB (ref 8.9–10.3)
CHLORIDE: 104 mmol/L (ref 101–111)
CO2: 29 mmol/L (ref 22–32)
Creatinine, Ser: 0.7 mg/dL (ref 0.44–1.00)
GFR calc non Af Amer: >60 mL/min (ref >60)
GLUCOSE: 114 mg/dL — AB (ref 65–99)
POTASSIUM: 3.7 mmol/L (ref 3.5–5.1)
Sodium: 140 mmol/L (ref 135–145)

## 2016-06-05 LAB — CBC
HEMATOCRIT: 34.3 % — AB (ref 35.0–47.0)
HEMOGLOBIN: 11.5 g/dL — AB (ref 12.0–16.0)
MCH: 30 pg (ref 26.0–34.0)
MCHC: 33.5 g/dL (ref 32.0–36.0)
MCV: 89.7 fL (ref 80.0–100.0)
Platelets: 180 10*3/uL (ref 150–440)
RBC: 3.83 MIL/uL (ref 3.80–5.20)
RDW: 14.5 % (ref 11.5–14.5)
WBC: 8.6 10*3/uL (ref 3.6–11.0)

## 2016-06-05 LAB — PROTIME-INR
INR: 0.99
PROTHROMBIN TIME: 13.1 s (ref 11.4–15.2)

## 2016-06-05 NOTE — Progress Notes (Signed)
Cardiology Office Note   Date:  06/05/2016   ID:  Lauren Bartlett, DOB May 20, 1955, MRN [id number]  PCP:  Lauren Mc, MD  Cardiologist:  New  Chief Complaint  Patient presents with  . other    Follow up from Echo, the cardiac cath was cancelled. "doing well."  Meds reviewed by the pt. verbally.       History of Present Illness: Lauren Bartlett is a 61 y.o. female who is here today for a follow-up visit regarding mitral regurgitation. The patient has chronic medical conditions that include COPD, mitral valve prolapse, essential hypertension, anxiety and depression. She is a lifelong nonsmoker but reports being exposed to smoking. The patient had multiple recent emergency room visits for "COPD exacerbations". She is known to have moderate COPD followed by Dr. Alva Garnet.  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 She has history of sleep apnea with intolerance to CPAP. It was felt that her shortness of breath was out of proportion to pulmonary findings. Recent echocardiogram showed normal LV systolic function, grade 2 diastolic dysfunction, calcified mitral valve annulus with severe prolapse involving the posterior leaflet with severe anterior mitral regurgitation, moderately dilated left atrium and moderate to severe pulmonary hypertension with a systolic pulmonary pressure of 60 mmHg.  The patient is very limited due to shortness of breath with minimal activities, orthopnea and PND. She walks with a cane.  She was scheduled for TEE and a right and left cardiac catheterization. On the day of TEE, she was noted to be very dyspneic with low grade fever. Thus, the procedure was canceled and she was admitted to the hospital where she was diagnosed with influenza B. She recovered completely from that and she is back to baseline.   Past Medical History:  Diagnosis Date  . Anxiety   . Congenital absence of one kidney   . COPD (chronic obstructive pulmonary disease) ([place])   . Depression    treated at [place]  . Emphysema/COPD ([place])   . History of sciatica   . Hypertension   . hypothyrodism   . Hypothyroidism   . Motion sickness    cars  . MVP (mitral valve prolapse)   . PONV (postoperative nausea and vomiting)   . Pulmonary hypertension   . Sleep apnea    O2 at night and PRN    Past Surgical History:  Procedure Laterality Date  . ABDOMINAL HYSTERECTOMY    . BREAST BIOPSY Right 2011   [facility]< benign  . CATARACT EXTRACTION W/PHACO Left 11/14/2015   Procedure: CATARACT EXTRACTION PHACO AND INTRAOCULAR LENS PLACEMENT (IOC);  Surgeon: Lauren Koyanagi, MD;  Location: [facility];  Service: Ophthalmology;  Laterality: Left;  sleep apnea [place]  . CATARACT EXTRACTION W/PHACO Right 12/19/2015   Procedure: CATARACT EXTRACTION PHACO AND INTRAOCULAR LENS PLACEMENT (IOC);  Surgeon: Lauren Koyanagi, MD;  Location: [facility];  Service: Ophthalmology;  Laterality: Right;  ANXIETY GENEROUS IV SEDATION TORIC LEN  . COMBINED HYSTERECTOMY ABDOMINAL W/ A&P REPAIR / OOPHORECTOMY  1996   benign tumor  . INNER EAR SURGERY     bilateral  . TEE WITHOUT CARDIOVERSION N/A 05/19/2016   Procedure: TRANSESOPHAGEAL ECHOCARDIOGRAM (TEE);  Surgeon: Lauren Hampshire, MD;  Location: [facility];  Service: Cardiovascular;  Laterality: N/A;     Current Outpatient Prescriptions  Medication Sig Dispense Refill  . albuterol (PROVENTIL HFA;VENTOLIN HFA) 108 (90 Base) MCG/ACT inhaler Inhale 2 puffs into the lungs every 6 (six) hours as needed for wheezing or shortness  of breath. Pt uses with SPACER.    [name] albuterol (PROVENTIL) (2.5 MG/3ML) 0.083% nebulizer solution Take 2.5 mg by nebulization every 6 (six) hours as needed for wheezing or shortness of breath.    . budesonide (PULMICORT) 0.5 MG/2ML nebulizer solution Take 2 mLs (0.5 mg total) by nebulization 2 (two) times daily. 50 mL 0  . fluticasone (FLONASE) 50 MCG/ACT nasal spray Place 2 sprays into both nostrils daily.    .  hydrochlorothiazide (HYDRODIURIL) 25 MG tablet TAKE 1/2 TABLET(12.5 MG) BY MOUTH DAILY 45 tablet 1  . hydroxypropyl methylcellulose / hypromellose (ISOPTO TEARS / GONIOVISC) 2.5 % ophthalmic solution Place 1 drop into both eyes 2 (two) times daily.    [name] levothyroxine (SYNTHROID, LEVOTHROID) 112 MCG tablet Take 112 mcg by mouth daily before breakfast.    . loratadine-pseudoephedrine (CLARITIN-D 24-HOUR) 10-240 MG 24 hr tablet Take 1 tablet by mouth daily.    [name] LORazepam (ATIVAN) 0.5 MG tablet Take 1 tablet (0.5 mg total) by mouth every 8 (eight) hours as needed for anxiety. 30 tablet 0  . losartan (COZAAR) 100 MG tablet TAKE 1 TABLET(100 MG) BY MOUTH DAILY 30 tablet 5  . montelukast (SINGULAIR) 10 MG tablet Take 1 tablet (10 mg total) by mouth at bedtime. 30 tablet 3  . OXYGEN Inhale 2 L into the lungs at bedtime as needed (at bedtime and as needed).    . polyethylene glycol powder (GLYCOLAX/MIRALAX) powder Take 17 g by mouth 2 (two) times daily as needed. 3350 g 1  . traMADol (ULTRAM) 50 MG tablet 1-2 tabs po q 6 hr prn pain Maximum dose= 8 tablets per day 20 tablet 0  . umeclidinium-vilanterol (ANORO ELLIPTA) 62.5-25 MCG/INH AEPB Inhale 1 puff into the lungs daily. 1 each 11  . fluticasone (FLOVENT HFA) 110 MCG/ACT inhaler Inhale 1 puff into the lungs 2 (two) times daily.     Current Facility-Administered Medications  Medication Dose Route Frequency Provider Last Rate Last Dose  . methylPREDNISolone acetate (DEPO-MEDROL) injection (RADIOLOGY ONLY) 80 mg  80 mg Intramuscular Once Lauren Hawthorne, FNP        Allergies:   Codeine; Penicillins; Zithromax [azithromycin]; and Prednisone    Social History:  The patient  reports that she has never smoked. She has never used smokeless tobacco. She reports that she does not drink alcohol or use drugs.   Family History:  The patient's family history includes Breast cancer in her paternal aunt; Coronary artery disease in her father; Heart disease in  her brother; Multiple sclerosis in her mother.    ROS:  Please see the history of present illness.   Otherwise, review of systems are positive for none.   All other systems are reviewed and negative.    PHYSICAL EXAM: VS:  BP 132/74 (BP Location: Left Arm, Patient Position: Sitting, Cuff Size: Normal)   Pulse 93   Ht 5\' 2"  (1.575 m)   Wt 220 lb (99.8 kg)   BMI 40.24 kg/m  , BMI Body mass index is 40.24 kg/m. GEN: Well nourished, well developed, in no acute distress  HEENT: normal  Neck: no JVD, carotid bruits, or masses Cardiac: RRR; no rubs, or gallops,no edema . 3/6 holosystolic murmur at the apex radiating to the left sternal border and the base of the heart. Respiratory:  Mild expiratory wheezing, normal work of breathing GI: soft, nontender, nondistended, + BS MS: no deformity or atrophy  Skin: warm and dry, no rash Neuro:  Strength and sensation are intact Psych:  euthymic mood, full affect   EKG:  EKG is not ordered today.   Recent Labs: 09/04/2015: B Natriuretic Peptide 82.0 01/04/2016: ALT 13 05/20/2016: Hemoglobin 11.1; Platelets 130 05/22/2016: BUN 11; Creatinine, Ser 0.73; Magnesium 1.9; Potassium 3.6; Sodium 141    Lipid Panel    Component Value Date/Time   CHOL 173 03/14/2015 1048   TRIG 85.0 03/14/2015 1048   HDL 57.10 03/14/2015 1048   CHOLHDL 3 03/14/2015 1048   VLDL 17.0 03/14/2015 1048   LDLCALC 99 03/14/2015 1048      Wt Readings from Last 3 Encounters:  06/05/16 220 lb (99.8 kg)  05/30/16 213 lb (96.6 kg)  05/19/16 219 lb (99.3 kg)      PAD Screen 05/13/2016  Previous PAD dx? No  Previous surgical procedure? No  Pain with walking? No  Feet/toe relief with dangling? No  Painful, non-healing ulcers? No  Extremities discolored? No      ASSESSMENT AND PLAN:  1.  Severe mitral regurgitation due to mitral valve prolapse: She appears to be symptomatic. I suspect that her dyspnea is multifactorial due to mitral regurgitation, COPD and physical  deconditioning. She had recent influenza and she has recovered completely from that. I'm going to reschedule a transesophageal echocardiogram followed by a right and left cardiac catheterization with anticipation of mitral valve repair as long as his COPD he is not severe enough to be prohibitive. I discussed the procedures and details as well as risks and benefits.   2. Pulmonary hypertension likely due to mitral regurgitation. This will be evaluated with a right heart catheterization.    Disposition:   FU with me in 1 month  Signed,  Kathlyn Sacramento, MD  06/05/2016 3:25 PM    [place]

## 2016-06-05 NOTE — Patient Instructions (Addendum)
Medication Instructions:  Your physician recommends that you continue on your current medications as directed. Please refer to the Current Medication list given to you today.   Labwork: BMET, CBC, PT/INR today at the outpatient lab - [facility]  Testing/Procedures: Your physician has requested that you have a TEE. During a TEE, sound waves are used to create images of your heart. It provides your doctor with information about the size and shape of your heart and how well your heart's chambers and valves are working. In this test, a transducer is attached to the end of a flexible tube that's guided down your throat and into your esophagus (the tube leading from you mouth to your stomach) to get a more detailed image of your heart. You are not awake for the procedure. Please see the instruction sheet given to you today. For further information please visit [email or web address].  TEE Monday, March 12 Arrival time 6:30am [place] Nothing to eat or drink after midnight the evening before your TEE. You may take your morning medications with a sip of water.  Your physician has requested that you have a cardiac catheterization. Cardiac catheterization is used to diagnose and/or treat various heart conditions. Doctors may recommend this procedure for a number of different reasons. The most common reason is to evaluate chest pain. Chest pain can be a symptom of coronary artery disease (CAD), and cardiac catheterization can show whether plaque is narrowing or blocking your heart's arteries. This procedure is also used to evaluate the valves, as well as measure the blood flow and oxygen levels in different parts of your heart. For further information please visit [email or web address]. Please follow instruction sheet, as given.  Cardiac catheterization Monday, March 19 Arrival time 8:30am [place] Nothing to eat or drink after midnight the evening before your catheterization. You may  take your morning medications with a sip of water.  Follow-Up: Your physician recommends that you schedule a follow-up appointment in: 3 weeks with [name].    Any Other Special Instructions Will Be Listed Below (If Applicable).     If you need a refill on your cardiac medications before your next appointment, please call your pharmacy.   Transesophageal Echocardiogram Transesophageal echocardiography (TEE) is a picture test of your heart using sound waves. The pictures taken can give very detailed pictures of your heart. This can help your doctor see if there are problems with your heart. TEE can check:  If your heart has blood clots in it.  How well your heart valves are working.  If you have an infection on the inside of your heart.  Some of the major arteries of your heart.  If your heart valve is working after a Office manager.  Your heart before a procedure that uses a shock to your heart to get the rhythm back to normal. What happens before the procedure?  Do not eat or drink for 6 hours before the procedure or as told by your doctor.  Make plans to have someone drive you home after the procedure. Do not drive yourself home.  An IV tube will be put in your arm. What happens during the procedure?  You will be given a medicine to help you relax (sedative). It will be given through the IV tube.  A numbing medicine will be sprayed or gargled in the back of your throat to help numb it.  The tip of the probe is placed into the back of your mouth. You will be  asked to swallow. This helps to pass the probe into your esophagus.  Once the tip of the probe is in the right place, your doctor can take pictures of your heart.  You may feel pressure at the back of your throat. What happens after the procedure?  You will be taken to a recovery area so the sedative can wear off.  Your throat may be sore and scratchy. This will go away slowly over time.  You will go home when you  are fully awake and able to swallow liquids.  You should have someone stay with you for the next 24 hours.  Do not drive or operate machinery for the next 24 hours. This information is not intended to replace advice given to you by your health care provider. Make sure you discuss any questions you have with your health care provider. Document Released: 01/12/2009 Document Revised: 08/23/2015 Document Reviewed: 09/16/2012 Elsevier Interactive Patient Education  2017 [place] An angiogram, also called angiography, is a procedure used to look at the blood vessels. In this procedure, dye is injected through a long, thin tube (catheter) into an artery. X-rays are then taken. The X-rays will show if there is a blockage or problem in a blood vessel. Tell a health care provider about:  Any allergies you have, including allergies to shellfish or contrast dye.  All medicines you are taking, including vitamins, herbs, eye drops, creams, and over-the-counter medicines.  Any problems you or family members have had with anesthetic medicines.  Any blood disorders you have.  Any surgeries you have had.  Any previous kidney problems or failure you have had.  Any medical conditions you have.  Possibility of pregnancy, if this applies. What are the risks? Generally, an angiogram is a safe procedure. However, as with any procedure, problems can occur. Possible problems include:  Injury to the blood vessels, including rupture or bleeding.  Infection or bruising at the catheter site.  Allergic reaction to the dye or contrast used.  Kidney damage from the dye or contrast used.  Blood clots that can lead to a stroke or heart attack. What happens before the procedure?  Do not eat or drink after midnight on the night before the procedure, or as directed by your health care provider.  Ask your health care provider if you may drink enough water to take any needed medicines the  morning of the procedure. What happens during the procedure?  You may be given a medicine to help you relax (sedative) before and during the procedure. This medicine is given through an IV access tube that is inserted into one of your veins.  The area where the catheter will be inserted will be washed and shaved. This is usually done in the groin but may be done in the fold of your arm (near your elbow) or in the wrist.  A medicine will be given to numb the area where the catheter will be inserted (local anesthetic).  The catheter will be inserted with a guide wire into an artery. The catheter is guided by using a type of X-ray (fluoroscopy) to the blood vessel being examined.  Dye is then injected into the catheter, and X-rays are taken. The dye helps to show where any narrowing or blockages are located. What happens after the procedure?  If the procedure is done through the leg, you will be kept in bed lying flat for several hours. You will be instructed to not bend or cross your  legs.  The insertion site will be checked frequently.  The pulse in your feet or wrist will be checked frequently.  Additional blood tests, X-rays, and electrocardiography may be done.  You may need to stay in the hospital overnight for observation. This information is not intended to replace advice given to you by your health care provider. Make sure you discuss any questions you have with your health care provider. Document Released: 12/25/2004 Document Revised: 08/29/2015 Document Reviewed: 08/18/2012 Elsevier Interactive Patient Education  2017 [place] An angiogram is an X-ray test. It is used to look at your blood vessels. For this test, a dye is put into the blood vessel being checked. The dye shows up on X-rays. It helps your doctor see if there is a blockage or other problem in the blood vessel. What happens before the procedure?  Follow your doctor's instructions about limiting  what you eat or drink.  Ask your doctor if you may drink enough water to take any needed medicines the morning of the test.  Plan to have someone take you home after the test.  If you go home the same day as the test, plan to have someone stay with you for 24 hours. What happens during the procedure?  An IV tube will be put into one of your veins.  You will be given a medicine that makes you relax (sedative).  Your skin will be washed where the thin tube (catheter) will be put in. Hair may be removed from this area. The tube may be put into:  Your upper leg area (groin).  The fold of your arm, near your elbow.  Your wrist.  You will be given a medicine that numbs the area where the tube will be inserted (local anesthetic).  The tube will be inserted into a blood vessel.  Using a type of X-ray (fluoroscopy) to see, your doctor will move the tube into the blood vessel to check it.  Dye will be put in through the tube. X-rays of your blood vessels will then be taken. Different doctors and hospitals may do this procedure differently. What happens after the procedure?  If the test is done through the leg, you will be kept in bed lying flat for several hours. You will be told to not bend or cross your legs.  The area where the tube was inserted will be checked often.  The pulse in your feet or wrist will be checked often.  More tests or X-rays may be done. This information is not intended to replace advice given to you by your health care provider. Make sure you discuss any questions you have with your health care provider. Document Released: 06/13/2008 Document Revised: 08/23/2015 Document Reviewed: 08/18/2012 Elsevier Interactive Patient Education  2017 [employer].

## 2016-06-06 ENCOUNTER — Telehealth: Payer: Self-pay | Admitting: Cardiovascular Disease

## 2016-06-06 DIAGNOSIS — J441 Chronic obstructive pulmonary disease with (acute) exacerbation: Secondary | ICD-10-CM | POA: Diagnosis not present

## 2016-06-06 DIAGNOSIS — R531 Weakness: Secondary | ICD-10-CM | POA: Diagnosis not present

## 2016-06-06 NOTE — Telephone Encounter (Signed)
Reviewed lab results and TEE instructions w/pt who verbalized understanding.

## 2016-06-09 ENCOUNTER — Ambulatory Visit: Payer: Medicare Other | Admitting: Anesthesiology

## 2016-06-09 ENCOUNTER — Ambulatory Visit: Admission: RE | Admit: 2016-06-09 | Payer: Medicare Other | Source: Ambulatory Visit | Admitting: Cardiovascular Disease

## 2016-06-09 ENCOUNTER — Ambulatory Visit
Admission: RE | Admit: 2016-06-09 | Discharge: 2016-06-09 | Disposition: A | Discharge status: Home / Self Care | Payer: Medicare Other | Source: Ambulatory Visit | Attending: Cardiovascular Disease | Admitting: Cardiovascular Disease

## 2016-06-09 ENCOUNTER — Encounter
Admission: RE | Disposition: A | Discharge status: Home / Self Care | Payer: Self-pay | Source: Ambulatory Visit | Attending: Cardiovascular Disease

## 2016-06-09 DIAGNOSIS — I340 Nonrheumatic mitral (valve) insufficiency: Secondary | ICD-10-CM | POA: Insufficient documentation

## 2016-06-09 DIAGNOSIS — F419 Anxiety disorder, unspecified: Secondary | ICD-10-CM | POA: Diagnosis not present

## 2016-06-09 DIAGNOSIS — J439 Emphysema, unspecified: Secondary | ICD-10-CM | POA: Insufficient documentation

## 2016-06-09 DIAGNOSIS — G4730 Sleep apnea, unspecified: Secondary | ICD-10-CM | POA: Insufficient documentation

## 2016-06-09 DIAGNOSIS — I10 Essential (primary) hypertension: Secondary | ICD-10-CM | POA: Insufficient documentation

## 2016-06-09 DIAGNOSIS — Z6841 Body Mass Index (BMI) 40.0 and over, adult: Secondary | ICD-10-CM | POA: Insufficient documentation

## 2016-06-09 DIAGNOSIS — E039 Hypothyroidism, unspecified: Secondary | ICD-10-CM | POA: Diagnosis not present

## 2016-06-09 DIAGNOSIS — F329 Major depressive disorder, single episode, unspecified: Secondary | ICD-10-CM | POA: Insufficient documentation

## 2016-06-09 DIAGNOSIS — Z9981 Dependence on supplemental oxygen: Secondary | ICD-10-CM | POA: Insufficient documentation

## 2016-06-09 DIAGNOSIS — I348 Other nonrheumatic mitral valve disorders: Secondary | ICD-10-CM | POA: Diagnosis not present

## 2016-06-09 DIAGNOSIS — Z538 Procedure and treatment not carried out for other reasons: Secondary | ICD-10-CM | POA: Insufficient documentation

## 2016-06-09 HISTORY — PX: TEE WITHOUT CARDIOVERSION: SHX5443

## 2016-06-09 SURGERY — ECHOCARDIOGRAM, TRANSESOPHAGEAL
Anesthesia: General

## 2016-06-09 MED ORDER — ONDANSETRON HCL 4 MG/2ML IJ SOLN
4.0000 mg | Freq: Once | INTRAMUSCULAR | Status: DC | PRN
Start: 2016-06-09 — End: 2016-06-09

## 2016-06-09 MED ORDER — FENTANYL CITRATE (PF) 100 MCG/2ML IJ SOLN
INTRAMUSCULAR | Status: AC
  Filled 2016-06-09: qty 2

## 2016-06-09 MED ORDER — MIDAZOLAM HCL 2 MG/2ML IJ SOLN
INTRAMUSCULAR | Status: AC | PRN
  Administered 2016-06-09: 2 mg via INTRAVENOUS

## 2016-06-09 MED ORDER — MIDAZOLAM HCL 5 MG/5ML IJ SOLN
INTRAMUSCULAR | Status: AC
  Filled 2016-06-09: qty 5

## 2016-06-09 MED ORDER — MIDAZOLAM HCL 5 MG/5ML IJ SOLN
INTRAMUSCULAR | Status: AC | PRN
  Administered 2016-06-09: 2 mg via INTRAVENOUS

## 2016-06-09 MED ORDER — FENTANYL CITRATE (PF) 100 MCG/2ML IJ SOLN: 25.0000 ug | INTRAMUSCULAR | Status: DC | PRN

## 2016-06-09 MED ORDER — PROPOFOL 10 MG/ML IV BOLUS
INTRAVENOUS | Status: AC
  Filled 2016-06-09: qty 20

## 2016-06-09 MED ORDER — FENTANYL CITRATE (PF) 100 MCG/2ML IJ SOLN
INTRAMUSCULAR | Status: AC | PRN
  Administered 2016-06-09: 50 ug via INTRAVENOUS

## 2016-06-09 MED ORDER — SODIUM CHLORIDE FLUSH 0.9 % IV SOLN
INTRAVENOUS | Status: AC
  Filled 2016-06-09: qty 10

## 2016-06-09 MED ORDER — LIDOCAINE VISCOUS 2 % MT SOLN
OROMUCOSAL | Status: AC
  Filled 2016-06-09: qty 15

## 2016-06-09 MED ORDER — BUTAMBEN-TETRACAINE-BENZOCAINE 2-2-14 % EX AERO
INHALATION_SPRAY | CUTANEOUS | Status: AC
  Filled 2016-06-09: qty 20

## 2016-06-09 MED ORDER — SODIUM CHLORIDE 0.9 % IV SOLN: INTRAVENOUS | Status: DC

## 2016-06-09 MED ORDER — MIDAZOLAM HCL 2 MG/2ML IJ SOLN
INTRAMUSCULAR | Status: AC
  Filled 2016-06-09: qty 2

## 2016-06-09 NOTE — H&P (View-Only) (Signed)
Cardiology Office Note   Date:  06/05/2016   ID:  Lauren Bartlett, DOB Aug 15, 1955, MRN [id number]  PCP:  [name], MD  Cardiologist:  New  Chief Complaint  Patient presents with  . other    Follow up from Echo, the cardiac cath was cancelled. "doing well."  Meds reviewed by the pt. verbally.       History of Present Illness: Lauren Bartlett is a 61 y.o. female who is here today for a follow-up visit regarding mitral regurgitation. The patient has chronic medical conditions that include COPD, mitral valve prolapse, essential hypertension, anxiety and depression. She is a lifelong nonsmoker but reports being exposed to smoking. The patient had multiple recent emergency room visits for "COPD exacerbations". She is known to have moderate COPD followed by Dr. Alva Garnet.  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 She has history of sleep apnea with intolerance to CPAP. It was felt that her shortness of breath was out of proportion to pulmonary findings. Recent echocardiogram showed normal LV systolic function, grade 2 diastolic dysfunction, calcified mitral valve annulus with severe prolapse involving the posterior leaflet with severe anterior mitral regurgitation, moderately dilated left atrium and moderate to severe pulmonary hypertension with a systolic pulmonary pressure of 60 mmHg.  The patient is very limited due to shortness of breath with minimal activities, orthopnea and PND. She walks with a cane.  She was scheduled for TEE and a right and left cardiac catheterization. On the day of TEE, she was noted to be very dyspneic with low grade fever. Thus, the procedure was canceled and she was admitted to the hospital where she was diagnosed with influenza B. She recovered completely from that and she is back to baseline.   Past Medical History:  Diagnosis Date  . Anxiety   . Congenital absence of one kidney   . COPD (chronic obstructive pulmonary disease) ([place])   . Depression    treated at [facility]  . Emphysema/COPD ([place])   . History of sciatica   . Hypertension   . hypothyrodism   . Hypothyroidism   . Motion sickness    cars  . MVP (mitral valve prolapse)   . PONV (postoperative nausea and vomiting)   . Pulmonary hypertension   . Sleep apnea    O2 at night and PRN    Past Surgical History:  Procedure Laterality Date  . ABDOMINAL HYSTERECTOMY    . BREAST BIOPSY Right 2011   [facility]< benign  . CATARACT EXTRACTION W/PHACO Left 11/14/2015   Procedure: CATARACT EXTRACTION PHACO AND INTRAOCULAR LENS PLACEMENT (IOC);  Surgeon: Leandrew Koyanagi, MD;  Location: [facility];  Service: Ophthalmology;  Laterality: Left;  sleep apnea [place]  . CATARACT EXTRACTION W/PHACO Right 12/19/2015   Procedure: CATARACT EXTRACTION PHACO AND INTRAOCULAR LENS PLACEMENT (IOC);  Surgeon: Leandrew Koyanagi, MD;  Location: [facility];  Service: Ophthalmology;  Laterality: Right;  ANXIETY GENEROUS IV SEDATION TORIC LEN  . COMBINED HYSTERECTOMY ABDOMINAL W/ A&P REPAIR / OOPHORECTOMY  1996   benign tumor  . INNER EAR SURGERY     bilateral  . TEE WITHOUT CARDIOVERSION N/A 05/19/2016   Procedure: TRANSESOPHAGEAL ECHOCARDIOGRAM (TEE);  Surgeon: Wellington Hampshire, MD;  Location: [facility];  Service: Cardiovascular;  Laterality: N/A;     Current Outpatient Prescriptions  Medication Sig Dispense Refill  . albuterol (PROVENTIL HFA;VENTOLIN HFA) 108 (90 Base) MCG/ACT inhaler Inhale 2 puffs into the lungs every 6 (six) hours as needed for wheezing or shortness  of breath. Pt uses with SPACER.    [name] albuterol (PROVENTIL) (2.5 MG/3ML) 0.083% nebulizer solution Take 2.5 mg by nebulization every 6 (six) hours as needed for wheezing or shortness of breath.    . budesonide (PULMICORT) 0.5 MG/2ML nebulizer solution Take 2 mLs (0.5 mg total) by nebulization 2 (two) times daily. 50 mL 0  . fluticasone (FLONASE) 50 MCG/ACT nasal spray Place 2 sprays into both nostrils daily.    .  hydrochlorothiazide (HYDRODIURIL) 25 MG tablet TAKE 1/2 TABLET(12.5 MG) BY MOUTH DAILY 45 tablet 1  . hydroxypropyl methylcellulose / hypromellose (ISOPTO TEARS / GONIOVISC) 2.5 % ophthalmic solution Place 1 drop into both eyes 2 (two) times daily.    [name] levothyroxine (SYNTHROID, LEVOTHROID) 112 MCG tablet Take 112 mcg by mouth daily before breakfast.    . loratadine-pseudoephedrine (CLARITIN-D 24-HOUR) 10-240 MG 24 hr tablet Take 1 tablet by mouth daily.    [name] LORazepam (ATIVAN) 0.5 MG tablet Take 1 tablet (0.5 mg total) by mouth every 8 (eight) hours as needed for anxiety. 30 tablet 0  . losartan (COZAAR) 100 MG tablet TAKE 1 TABLET(100 MG) BY MOUTH DAILY 30 tablet 5  . montelukast (SINGULAIR) 10 MG tablet Take 1 tablet (10 mg total) by mouth at bedtime. 30 tablet 3  . OXYGEN Inhale 2 L into the lungs at bedtime as needed (at bedtime and as needed).    . polyethylene glycol powder (GLYCOLAX/MIRALAX) powder Take 17 g by mouth 2 (two) times daily as needed. 3350 g 1  . traMADol (ULTRAM) 50 MG tablet 1-2 tabs po q 6 hr prn pain Maximum dose= 8 tablets per day 20 tablet 0  . umeclidinium-vilanterol (ANORO ELLIPTA) 62.5-25 MCG/INH AEPB Inhale 1 puff into the lungs daily. 1 each 11  . fluticasone (FLOVENT HFA) 110 MCG/ACT inhaler Inhale 1 puff into the lungs 2 (two) times daily.     Current Facility-Administered Medications  Medication Dose Route Frequency Provider Last Rate Last Dose  . methylPREDNISolone acetate (DEPO-MEDROL) injection (RADIOLOGY ONLY) 80 mg  80 mg Intramuscular Once Burnard Hawthorne, FNP        Allergies:   Codeine; Penicillins; Zithromax [azithromycin]; and Prednisone    Social History:  The patient  reports that she has never smoked. She has never used smokeless tobacco. She reports that she does not drink alcohol or use drugs.   Family History:  The patient's family history includes Breast cancer in her paternal aunt; Coronary artery disease in her father; Heart disease in  her brother; Multiple sclerosis in her mother.    ROS:  Please see the history of present illness.   Otherwise, review of systems are positive for none.   All other systems are reviewed and negative.    PHYSICAL EXAM: VS:  BP 132/74 (BP Location: Left Arm, Patient Position: Sitting, Cuff Size: Normal)   Pulse 93   Ht 5\' 2"  (1.575 m)   Wt 220 lb (99.8 kg)   BMI 40.24 kg/m  , BMI Body mass index is 40.24 kg/m. GEN: Well nourished, well developed, in no acute distress  HEENT: normal  Neck: no JVD, carotid bruits, or masses Cardiac: RRR; no rubs, or gallops,no edema . 3/6 holosystolic murmur at the apex radiating to the left sternal border and the base of the heart. Respiratory:  Mild expiratory wheezing, normal work of breathing GI: soft, nontender, nondistended, + BS MS: no deformity or atrophy  Skin: warm and dry, no rash Neuro:  Strength and sensation are intact Psych:  euthymic mood, full affect   EKG:  EKG is not ordered today.   Recent Labs: 09/04/2015: B Natriuretic Peptide 82.0 01/04/2016: ALT 13 05/20/2016: Hemoglobin 11.1; Platelets 130 05/22/2016: BUN 11; Creatinine, Ser 0.73; Magnesium 1.9; Potassium 3.6; Sodium 141    Lipid Panel    Component Value Date/Time   CHOL 173 03/14/2015 1048   TRIG 85.0 03/14/2015 1048   HDL 57.10 03/14/2015 1048   CHOLHDL 3 03/14/2015 1048   VLDL 17.0 03/14/2015 1048   LDLCALC 99 03/14/2015 1048      Wt Readings from Last 3 Encounters:  06/05/16 220 lb (99.8 kg)  05/30/16 213 lb (96.6 kg)  05/19/16 219 lb (99.3 kg)      PAD Screen 05/13/2016  Previous PAD dx? No  Previous surgical procedure? No  Pain with walking? No  Feet/toe relief with dangling? No  Painful, non-healing ulcers? No  Extremities discolored? No      ASSESSMENT AND PLAN:  1.  Severe mitral regurgitation due to mitral valve prolapse: She appears to be symptomatic. I suspect that her dyspnea is multifactorial due to mitral regurgitation, COPD and physical  deconditioning. She had recent influenza and she has recovered completely from that. I'm going to reschedule a transesophageal echocardiogram followed by a right and left cardiac catheterization with anticipation of mitral valve repair as long as his COPD he is not severe enough to be prohibitive. I discussed the procedures and details as well as risks and benefits.   2. Pulmonary hypertension likely due to mitral regurgitation. This will be evaluated with a right heart catheterization.    Disposition:   FU with me in 1 month  Signed,  Kathlyn Sacramento, MD  06/05/2016 3:25 PM    [place]

## 2016-06-09 NOTE — OR Nursing (Signed)
Patient unable to tolerate procedure under moderate sedation.  Dr. Jerline Pain to reschedule procedure next Monday at the same time with general anesthesia.  Pamala Hurry, in scheduling notified of the change

## 2016-06-09 NOTE — Interval H&P Note (Signed)
History and Physical Interval Note:  06/09/2016 9:16 AM  Lauren Bartlett  has presented today for surgery, with the diagnosis of TEE     Mitral valve disease  The various methods of treatment have been discussed with the patient and family. After consideration of risks, benefits and other options for treatment, the patient has consented to  Procedure(s): TRANSESOPHAGEAL ECHOCARDIOGRAM (TEE) (N/A) as a surgical intervention .  The patient's history has been reviewed, patient examined, no change in status, stable for surgery.  I have reviewed the patient's chart and labs.  Questions were answered to the patient's satisfaction.     Kathlyn Sacramento

## 2016-06-09 NOTE — CV Procedure (Signed)
The patient came for an outpatient TEE. She was given 4 mg of Versed and 50 g of fentanyl. In spite of that, she was  awake and significantly resisted insertion of TEE probe. Given her pulmonary status, I was not comfortable escalating her sedation. The procedure was canceled and will be rescheduled with anesthesia next week.

## 2016-06-09 NOTE — Discharge Instructions (Signed)
Transesophageal Echocardiogram  Transesophageal echocardiography (TEE) is a picture test of your heart using sound waves. The pictures taken can give very detailed pictures of your heart. This can help your doctor see if there are problems with your heart. TEE can check:  · If your heart has blood clots in it.  · How well your heart valves are working.  · If you have an infection on the inside of your heart.  · Some of the major arteries of your heart.  · If your heart valve is working after a repair.  · Your heart before a procedure that uses a shock to your heart to get the rhythm back to normal.    What happens before the procedure?  · Do not eat or drink for 6 hours before the procedure or as told by your doctor.  · Make plans to have someone drive you home after the procedure. Do not drive yourself home.  · An IV tube will be put in your arm.  What happens during the procedure?  · You will be given a medicine to help you relax (sedative). It will be given through the IV tube.  · A numbing medicine will be sprayed or gargled in the back of your throat to help numb it.  · The tip of the probe is placed into the back of your mouth. You will be asked to swallow. This helps to pass the probe into your esophagus.  · Once the tip of the probe is in the right place, your doctor can take pictures of your heart.  · You may feel pressure at the back of your throat.  What happens after the procedure?  · You will be taken to a recovery area so the sedative can wear off.  · Your throat may be sore and scratchy. This will go away slowly over time.  · You will go home when you are fully awake and able to swallow liquids.  · You should have someone stay with you for the next 24 hours.  · Do not drive or operate machinery for the next 24 hours.  This information is not intended to replace advice given to you by your health care provider. Make sure you discuss any questions you have with your health care provider.  Document  Released: 01/12/2009 Document Revised: 08/23/2015 Document Reviewed: 09/16/2012  Elsevier Interactive Patient Education © 2017 [employer]

## 2016-06-09 NOTE — Anesthesia Preprocedure Evaluation (Addendum)
Anesthesia Evaluation  Patient identified by MRN, date of birth, ID band Patient awake    Reviewed: Allergy & Precautions, NPO status , Patient's Chart, lab work & pertinent test results  History of Anesthesia Complications (+) PONV and history of anesthetic complications  Airway Mallampati: III  TM Distance: >3 FB Neck ROM: full    Dental  (+) Caps, Missing, Poor Dentition   Pulmonary shortness of breath and with exertion, sleep apnea , COPD,  COPD inhaler and oxygen dependent, neg recent URI,  Wears O2 at night          Cardiovascular Exercise Tolerance: Poor hypertension, (-) angina(-) CAD, (-) Past MI, (-) Cardiac Stents and (-) CABG (-) dysrhythmias + Valvular Problems/Murmurs MVP      Neuro/Psych PSYCHIATRIC DISORDERS Anxiety Depression negative neurological ROS     GI/Hepatic negative GI ROS, Neg liver ROS,   Endo/Other  neg diabetesHypothyroidism Morbid obesity  Renal/GU negative Renal ROS  negative genitourinary   Musculoskeletal   Abdominal   Peds  Hematology negative hematology ROS (+)   Anesthesia Other Findings Past Medical History: No date: Anxiety No date: Congenital absence of one kidney No date: COPD (chronic obstructive pulmonary disease) (* No date: Depression     Comment: [facility] No date: Emphysema/COPD [facility]) No date: History of sciatica No date: Hypertension No date: hypothyrodism No date: Hypothyroidism No date: Motion sickness     Comment: cars No date: MVP (mitral valve prolapse) No date: PONV (postoperative nausea and vomiting) No date: Pulmonary hypertension No date: Sleep apnea     Comment: O2 at night and PRN   Reproductive/Obstetrics negative OB ROS                           Anesthesia Physical Anesthesia Plan  ASA: III  Anesthesia Plan: General   Post-op Pain Management:    Induction:   Airway Management Planned:    Additional Equipment:   Intra-op Plan:   Post-operative Plan:   Informed Consent:   Plan Discussed with: Anesthesiologist and CRNA  Anesthesia Plan Comments:         Anesthesia Quick Evaluation

## 2016-06-10 ENCOUNTER — Telehealth: Payer: Self-pay | Admitting: Cardiovascular Disease

## 2016-06-10 ENCOUNTER — Encounter: Payer: Self-pay | Admitting: Cardiovascular Disease

## 2016-06-10 NOTE — Telephone Encounter (Signed)
Pt scheduled for TEE and right and left heart catheterization March 19. Arrival time 6:30am. Left detailed message with CB number on pt's VM

## 2016-06-10 NOTE — Telephone Encounter (Signed)
Pt states she is supposed to have a procedure on Monday 3/19, and needs to know what time. Please call.

## 2016-06-11 DIAGNOSIS — J441 Chronic obstructive pulmonary disease with (acute) exacerbation: Secondary | ICD-10-CM | POA: Diagnosis not present

## 2016-06-11 DIAGNOSIS — R531 Weakness: Secondary | ICD-10-CM | POA: Diagnosis not present

## 2016-06-12 DIAGNOSIS — J449 Chronic obstructive pulmonary disease, unspecified: Secondary | ICD-10-CM | POA: Diagnosis not present

## 2016-06-13 ENCOUNTER — Ambulatory Visit (INDEPENDENT_AMBULATORY_CARE_PROVIDER_SITE_OTHER): Payer: Medicare Other | Admitting: Internal Medicine

## 2016-06-13 ENCOUNTER — Encounter: Payer: Self-pay | Admitting: Internal Medicine

## 2016-06-13 VITALS — BP 118/66 | HR 85 | Resp 16 | Ht 62.0 in | Wt 218.6 lb

## 2016-06-13 DIAGNOSIS — J441 Chronic obstructive pulmonary disease with (acute) exacerbation: Secondary | ICD-10-CM

## 2016-06-13 DIAGNOSIS — R0902 Hypoxemia: Secondary | ICD-10-CM | POA: Diagnosis not present

## 2016-06-13 DIAGNOSIS — I340 Nonrheumatic mitral (valve) insufficiency: Secondary | ICD-10-CM | POA: Diagnosis not present

## 2016-06-13 DIAGNOSIS — R062 Wheezing: Secondary | ICD-10-CM | POA: Diagnosis not present

## 2016-06-13 MED ORDER — METHYLPREDNISOLONE ACETATE 80 MG/ML IJ SUSP
80.0000 mg | Freq: Once | INTRAMUSCULAR | Status: AC
  Administered 2016-06-13: 80 mg via INTRAMUSCULAR

## 2016-06-13 NOTE — Progress Notes (Signed)
Subjective:  Patient ID: Lauren Bartlett, female    DOB: Mar 20, 1956  Age: 61 y.o. MRN: [id number]  CC: The primary encounter diagnosis was Wheezing. Diagnoses of Hypoxia, COPD exacerbation ([place]), and Severe mitral regurgitation by prior echocardiogram were also pertinent to this visit.  HPI Lauren Bartlett presents for follow up on pulmonary hypertension seconday to severe mitral regurgitation  TEE resceduled to Monday and cardiac cath to be done  Patient anxious .  spent part of visit discussing how to many her fear  . Attends a bible study,  Made specific references.  discusse use of lorazepam , was given  lorazepam 0.5 mg, but the dose was too sedating,  Has not tried reducing dose to half   Sleep study  Results discussed.  She is "terrified of falling asleep" but the study was not suggestive of sleep apnea, but more of an upper airway syndrome  She has not had her upper airway evaluated, and prefers t o postpone until after her mitral valve issues have been dealt with.   She has been wheezing for a day or so, without cough or fevers..  Using her albuterol inhaler.       Outpatient Medications Prior to Visit  Medication Sig Dispense Refill  . acetaminophen (TYLENOL) 500 MG tablet Take 500 mg by mouth 2 (two) times daily as needed for moderate pain or headache.    . albuterol (PROVENTIL HFA;VENTOLIN HFA) 108 (90 Base) MCG/ACT inhaler Inhale 2 puffs into the lungs every 6 (six) hours as needed for wheezing or shortness of breath. Pt uses with SPACER.    [name] albuterol (PROVENTIL) (2.5 MG/3ML) 0.083% nebulizer solution Take 2.5 mg by nebulization every 6 (six) hours as needed for wheezing or shortness of breath.    . budesonide (PULMICORT) 0.5 MG/2ML nebulizer solution Take 2 mLs (0.5 mg total) by nebulization 2 (two) times daily. 50 mL 0  . famotidine (PEPCID AC) 10 MG chewable tablet Chew 10 mg by mouth daily as needed for heartburn.    . fluticasone (FLONASE) 50 MCG/ACT nasal spray Place 2  sprays into both nostrils daily.    . fluticasone (FLOVENT HFA) 110 MCG/ACT inhaler Inhale 1 puff into the lungs 2 (two) times daily.    . hydrochlorothiazide (HYDRODIURIL) 25 MG tablet TAKE 1/2 TABLET(12.5 MG) BY MOUTH DAILY 45 tablet 1  . hydroxypropyl methylcellulose / hypromellose (ISOPTO TEARS / GONIOVISC) 2.5 % ophthalmic solution Place 1 drop into both eyes 2 (two) times daily.    [name] levothyroxine (SYNTHROID, LEVOTHROID) 112 MCG tablet Take 112 mcg by mouth daily before breakfast.    . loratadine-pseudoephedrine (CLARITIN-D 24-HOUR) 10-240 MG 24 hr tablet Take 1 tablet by mouth daily.    [name] LORazepam (ATIVAN) 0.5 MG tablet Take 1 tablet (0.5 mg total) by mouth every 8 (eight) hours as needed for anxiety. (Patient taking differently: Take 0.5 mg by mouth daily as needed for anxiety. ) 30 tablet 0  . losartan (COZAAR) 100 MG tablet TAKE 1 TABLET(100 MG) BY MOUTH DAILY 30 tablet 5  . montelukast (SINGULAIR) 10 MG tablet Take 1 tablet (10 mg total) by mouth at bedtime. 30 tablet 3  . OXYGEN Inhale 2 L into the lungs at bedtime as needed (at bedtime and as needed).    . polyethylene glycol powder (GLYCOLAX/MIRALAX) powder Take 17 g by mouth 2 (two) times daily as needed. (Patient taking differently: Take 17 g by mouth 2 (two) times daily as needed for mild constipation. ) 3350 g  1  . traMADol (ULTRAM) 50 MG tablet 1-2 tabs po q 6 hr prn pain Maximum dose= 8 tablets per day (Patient taking differently: Take 50-100 mg by mouth 3 (three) times daily as needed for severe pain. ) 20 tablet 0  . umeclidinium-vilanterol (ANORO ELLIPTA) 62.5-25 MCG/INH AEPB Inhale 1 puff into the lungs daily. 1 each 11   Facility-Administered Medications Prior to Visit  Medication Dose Route Frequency Provider Last Rate Last Dose  . methylPREDNISolone acetate (DEPO-MEDROL) injection (RADIOLOGY ONLY) 80 mg  80 mg Intramuscular Once Burnard Hawthorne, FNP        Review of Systems;  Patient denies headache, fevers,  malaise, unintentional weight loss, skin rash, eye pain, sinus congestion and sinus pain, sore throat, dysphagia,  hemoptysis , cough,  chest pain, palpitations, orthopnea, edema, abdominal pain, nausea, melena, diarrhea, constipation, flank pain, dysuria, hematuria, urinary  Frequency, nocturia, numbness, tingling, seizures,  Focal weakness, Loss of consciousness,  Tremor, insomnia, depression, and suicidal ideation.      Objective:  BP 118/66 (BP Location: Left Arm, Patient Position: Sitting, Cuff Size: Large)   Pulse 85   Resp 16   Ht 5\' 2"  (1.575 m)   Wt 218 lb 9.6 oz (99.2 kg)   SpO2 96%   BMI 39.98 kg/m   BP Readings from Last 3 Encounters:  06/13/16 118/66  06/09/16 126/85  06/05/16 132/74    Wt Readings from Last 3 Encounters:  06/13/16 218 lb 9.6 oz (99.2 kg)  06/09/16 220 lb (99.8 kg)  06/05/16 220 lb (99.8 kg)    General appearance: alert, cooperative and appears stated age Ears: normal TM's and external ear canals both ears Throat: lips, mucosa, and tongue normal; teeth and gums normal Neck: no adenopathy, no carotid bruit, supple, symmetrical, trachea midline and thyroid not enlarged, symmetric, no tenderness/mass/nodules Back: symmetric, no curvature. ROM normal. No CVA tenderness. Lungs: end expiratory wheezes  Bilaterally, good air movement  Heart: regular rate and rhythm, S1, S2 normal, no murmur, click, rub or gallop Abdomen: soft, non-tender; bowel sounds normal; no masses,  no organomegaly Pulses: 2+ and symmetric Skin: Skin color, texture, turgor normal. No rashes or lesions Lymph nodes: Cervical, supraclavicular, and axillary nodes normal.  Lab Results  Component Value Date   HGBA1C 5.4 07/28/2015    Lab Results  Component Value Date   CREATININE 0.70 06/05/2016   CREATININE 0.73 05/22/2016   CREATININE 0.84 05/20/2016    Lab Results  Component Value Date   WBC 8.6 06/05/2016   HGB 11.5 (L) 06/05/2016   HCT 34.3 (L) 06/05/2016   PLT 180  06/05/2016   GLUCOSE 114 (H) 06/05/2016   CHOL 173 03/14/2015   TRIG 85.0 03/14/2015   HDL 57.10 03/14/2015   LDLCALC 99 03/14/2015   ALT 13 01/04/2016   AST 16 01/04/2016   NA 140 06/05/2016   K 3.7 06/05/2016   CL 104 06/05/2016   CREATININE 0.70 06/05/2016   BUN 19 06/05/2016   CO2 29 06/05/2016   TSH 1.02 03/14/2015   INR 0.99 06/05/2016   HGBA1C 5.4 07/28/2015    No results found.  Assessment & Plan:   Problem List Items Addressed This Visit    COPD exacerbation ([place])    Will treat with solumedrol 80 mg IM.  She does not have a productive cough,  So will hold off on antibiotics.       Relevant Medications   methylPREDNISolone acetate (DEPO-MEDROL) injection 80 mg (Completed)   Hypoxia  Sleep study failed to demonstrate signficant OSA events but suggested un upper airway obstruction. She has deferred ent follow up until after her mtiral valve issues are dealt with .        Severe mitral regurgitation by prior echocardiogram    She is scheduled for a tee and cardiac cath next week to prepare for mitral valve replacement        Other Visit Diagnoses    Wheezing    -  Primary   Relevant Medications   methylPREDNISolone acetate (DEPO-MEDROL) injection 80 mg (Completed)      I am having Ms. Meiklejohn maintain her loratadine-pseudoephedrine, montelukast, levothyroxine, polyethylene glycol powder, albuterol, fluticasone, fluticasone, hydrochlorothiazide, OXYGEN, traMADol, albuterol, LORazepam, umeclidinium-vilanterol, hydroxypropyl methylcellulose / hypromellose, budesonide, losartan, famotidine, and acetaminophen. We administered methylPREDNISolone acetate. We will continue to administer methylPREDNISolone acetate.  Meds ordered this encounter  Medications  . methylPREDNISolone acetate (DEPO-MEDROL) injection 80 mg    There are no discontinued medications.  Follow-up: No Follow-up on file.   [name], MD

## 2016-06-13 NOTE — Progress Notes (Signed)
Pre visit review using our clinic review tool, if applicable. No additional management support is needed unless otherwise documented below in the visit note. 

## 2016-06-13 NOTE — Patient Instructions (Addendum)
Cut the lorazepam in half for daytime use so it is less sedating  You received a  Shot of Depo  Medrol today to help resolve your wheezing   Continue your singulair and flonase and Claritin D

## 2016-06-13 NOTE — Assessment & Plan Note (Signed)
Sleep study failed to demonstrate signficant OSA events but suggested un upper airway obstruction. She has deferred ent follow up until after her mtiral valve issues are dealt with .

## 2016-06-14 NOTE — Assessment & Plan Note (Signed)
Will treat with solumedrol 80 mg IM.  She does not have a productive cough,  So will hold off on antibiotics.

## 2016-06-14 NOTE — Assessment & Plan Note (Signed)
She is scheduled for a tee and cardiac cath next week to prepare for mitral valve replacement

## 2016-06-16 ENCOUNTER — Ambulatory Visit: Payer: Medicare Other | Admitting: Anesthesiology

## 2016-06-16 ENCOUNTER — Encounter: Payer: Self-pay | Admitting: Anesthesiology

## 2016-06-16 ENCOUNTER — Encounter
Admission: RE | Disposition: A | Discharge status: Home / Self Care | Payer: Self-pay | Source: Ambulatory Visit | Attending: Cardiovascular Disease

## 2016-06-16 ENCOUNTER — Ambulatory Visit
Admission: RE | Admit: 2016-06-16 | Discharge: 2016-06-16 | Disposition: A | Discharge status: Home / Self Care | Payer: Medicare Other | Source: Ambulatory Visit | Attending: Cardiovascular Disease | Admitting: Cardiovascular Disease

## 2016-06-16 ENCOUNTER — Telehealth: Payer: Self-pay | Admitting: Cardiovascular Disease

## 2016-06-16 ENCOUNTER — Ambulatory Visit (HOSPITAL_BASED_OUTPATIENT_CLINIC_OR_DEPARTMENT_OTHER)
Admission: RE | Admit: 2016-06-16 | Discharge: 2016-06-16 | Disposition: A | Discharge status: Home / Self Care | Payer: Medicare Other | Source: Ambulatory Visit | Attending: Cardiovascular Disease | Admitting: Cardiovascular Disease

## 2016-06-16 ENCOUNTER — Other Ambulatory Visit: Payer: Self-pay

## 2016-06-16 DIAGNOSIS — I340 Nonrheumatic mitral (valve) insufficiency: Secondary | ICD-10-CM | POA: Insufficient documentation

## 2016-06-16 DIAGNOSIS — Z01818 Encounter for other preprocedural examination: Secondary | ICD-10-CM

## 2016-06-16 DIAGNOSIS — E039 Hypothyroidism, unspecified: Secondary | ICD-10-CM | POA: Insufficient documentation

## 2016-06-16 DIAGNOSIS — I341 Nonrheumatic mitral (valve) prolapse: Secondary | ICD-10-CM | POA: Diagnosis not present

## 2016-06-16 DIAGNOSIS — F329 Major depressive disorder, single episode, unspecified: Secondary | ICD-10-CM | POA: Insufficient documentation

## 2016-06-16 DIAGNOSIS — F419 Anxiety disorder, unspecified: Secondary | ICD-10-CM | POA: Diagnosis not present

## 2016-06-16 DIAGNOSIS — G4730 Sleep apnea, unspecified: Secondary | ICD-10-CM | POA: Insufficient documentation

## 2016-06-16 DIAGNOSIS — I10 Essential (primary) hypertension: Secondary | ICD-10-CM | POA: Diagnosis not present

## 2016-06-16 DIAGNOSIS — I059 Rheumatic mitral valve disease, unspecified: Secondary | ICD-10-CM

## 2016-06-16 DIAGNOSIS — J439 Emphysema, unspecified: Secondary | ICD-10-CM | POA: Insufficient documentation

## 2016-06-16 DIAGNOSIS — I272 Pulmonary hypertension, unspecified: Secondary | ICD-10-CM | POA: Insufficient documentation

## 2016-06-16 DIAGNOSIS — Z79899 Other long term (current) drug therapy: Secondary | ICD-10-CM | POA: Insufficient documentation

## 2016-06-16 DIAGNOSIS — Z880 Allergy status to penicillin: Secondary | ICD-10-CM | POA: Diagnosis not present

## 2016-06-16 DIAGNOSIS — J449 Chronic obstructive pulmonary disease, unspecified: Secondary | ICD-10-CM | POA: Diagnosis not present

## 2016-06-16 DIAGNOSIS — I348 Other nonrheumatic mitral valve disorders: Secondary | ICD-10-CM | POA: Diagnosis not present

## 2016-06-16 DIAGNOSIS — Z7951 Long term (current) use of inhaled steroids: Secondary | ICD-10-CM | POA: Insufficient documentation

## 2016-06-16 HISTORY — PX: RIGHT/LEFT HEART CATH AND CORONARY ANGIOGRAPHY: CATH118266

## 2016-06-16 HISTORY — PX: TEE WITHOUT CARDIOVERSION: SHX5443

## 2016-06-16 SURGERY — ECHOCARDIOGRAM, TRANSESOPHAGEAL
Anesthesia: General

## 2016-06-16 SURGERY — RIGHT/LEFT HEART CATH AND CORONARY ANGIOGRAPHY
Anesthesia: Moderate Sedation | Laterality: Bilateral

## 2016-06-16 MED ORDER — HEPARIN SODIUM (PORCINE) 1000 UNIT/ML IJ SOLN
INTRAMUSCULAR | Status: DC | PRN
  Administered 2016-06-16: 5000 [IU] via INTRAVENOUS

## 2016-06-16 MED ORDER — SODIUM CHLORIDE 0.9 % IV SOLN: 250.0000 mL | INTRAVENOUS | Status: DC | PRN

## 2016-06-16 MED ORDER — GLYCOPYRROLATE 0.2 MG/ML IJ SOLN
INTRAMUSCULAR | Status: AC
  Filled 2016-06-16: qty 1

## 2016-06-16 MED ORDER — FENTANYL CITRATE (PF) 100 MCG/2ML IJ SOLN
INTRAMUSCULAR | Status: AC
  Filled 2016-06-16: qty 2

## 2016-06-16 MED ORDER — SODIUM CHLORIDE 0.9% FLUSH: 3.0000 mL | INTRAVENOUS | Status: DC | PRN

## 2016-06-16 MED ORDER — HEPARIN SODIUM (PORCINE) 1000 UNIT/ML IJ SOLN
INTRAMUSCULAR | Status: AC
  Filled 2016-06-16: qty 1

## 2016-06-16 MED ORDER — VERAPAMIL HCL 2.5 MG/ML IV SOLN
INTRAVENOUS | Status: DC | PRN
  Administered 2016-06-16: 2.5 mg via INTRA_ARTERIAL

## 2016-06-16 MED ORDER — SODIUM CHLORIDE 0.9% FLUSH: 3.0000 mL | Freq: Two times a day (BID) | INTRAVENOUS | Status: DC

## 2016-06-16 MED ORDER — LIDOCAINE HCL (PF) 1 % IJ SOLN
INTRAMUSCULAR | Status: AC
  Filled 2016-06-16: qty 30

## 2016-06-16 MED ORDER — MIDAZOLAM HCL 2 MG/2ML IJ SOLN
INTRAMUSCULAR | Status: AC
  Filled 2016-06-16: qty 2

## 2016-06-16 MED ORDER — PROPOFOL 10 MG/ML IV BOLUS
INTRAVENOUS | Status: AC
  Filled 2016-06-16: qty 20

## 2016-06-16 MED ORDER — ATROPINE SULFATE 0.4 MG/ML IV SOSY
PREFILLED_SYRINGE | INTRAVENOUS | Status: AC
  Filled 2016-06-16: qty 2.5

## 2016-06-16 MED ORDER — SODIUM CHLORIDE 0.9 % IV SOLN: INTRAVENOUS | Status: DC

## 2016-06-16 MED ORDER — LIDOCAINE VISCOUS 2 % MT SOLN
OROMUCOSAL | Status: AC
  Filled 2016-06-16: qty 15

## 2016-06-16 MED ORDER — BUTAMBEN-TETRACAINE-BENZOCAINE 2-2-14 % EX AERO
INHALATION_SPRAY | CUTANEOUS | Status: AC
  Filled 2016-06-16: qty 20

## 2016-06-16 MED ORDER — FENTANYL CITRATE (PF) 100 MCG/2ML IJ SOLN
INTRAMUSCULAR | Status: DC | PRN
  Administered 2016-06-16: 50 ug via INTRAVENOUS

## 2016-06-16 MED ORDER — PROPOFOL 10 MG/ML IV BOLUS
INTRAVENOUS | Status: DC | PRN
  Administered 2016-06-16: 40 mg via INTRAVENOUS
  Administered 2016-06-16: 20 mg via INTRAVENOUS
  Administered 2016-06-16: 10 mg via INTRAVENOUS
  Administered 2016-06-16 (×2): 20 mg via INTRAVENOUS

## 2016-06-16 MED ORDER — FENTANYL CITRATE (PF) 100 MCG/2ML IJ SOLN
INTRAMUSCULAR | Status: DC | PRN
  Administered 2016-06-16 (×2): 25 ug via INTRAVENOUS

## 2016-06-16 MED ORDER — SODIUM CHLORIDE 0.9 % IV SOLN
INTRAVENOUS | Status: DC | PRN
  Administered 2016-06-16: 250 mL via INTRAVENOUS

## 2016-06-16 MED ORDER — MIDAZOLAM HCL 2 MG/2ML IJ SOLN
INTRAMUSCULAR | Status: DC | PRN
  Administered 2016-06-16: 1 mg via INTRAVENOUS

## 2016-06-16 MED ORDER — LIDOCAINE HCL (PF) 1 % IJ SOLN
INTRAMUSCULAR | Status: DC | PRN
  Administered 2016-06-16: 4 mL via SUBCUTANEOUS

## 2016-06-16 MED ORDER — IOPAMIDOL (ISOVUE-300) INJECTION 61%
INTRAVENOUS | Status: DC | PRN
  Administered 2016-06-16: 80 mL via INTRA_ARTERIAL

## 2016-06-16 MED ORDER — VERAPAMIL HCL 2.5 MG/ML IV SOLN
INTRAVENOUS | Status: AC
  Filled 2016-06-16: qty 2

## 2016-06-16 MED ORDER — SODIUM CHLORIDE 0.9 % IV SOLN
INTRAVENOUS | Status: DC
  Administered 2016-06-16: 08:00:00 via INTRAVENOUS

## 2016-06-16 SURGICAL SUPPLY — 12 items
CATH BALLN WEDGE 5F 110CM (CATHETERS) ×3 IMPLANT
CATH INFINITI 5FR ANG PIGTAIL (CATHETERS) ×3 IMPLANT
CATH OPTITORQUE JACKY 4.0 5F (CATHETERS) ×3 IMPLANT
DEVICE RAD TR BAND REGULAR (VASCULAR PRODUCTS) ×3 IMPLANT
GLIDESHEATH SLEND SS 6F .021 (SHEATH) ×6 IMPLANT
GUIDEWIRE .025 260CM (WIRE) ×3 IMPLANT
GUIDEWIRE EMER 3M J .025X150CM (WIRE) ×3 IMPLANT
KIT MANI 3VAL PERCEP (MISCELLANEOUS) ×3 IMPLANT
KIT RIGHT HEART (MISCELLANEOUS) ×3 IMPLANT
PACK CARDIAC CATH (CUSTOM PROCEDURE TRAY) ×3 IMPLANT
WIRE HITORQ VERSACORE ST 145CM (WIRE) ×3 IMPLANT
WIRE ROSEN-J .035X260CM (WIRE) ×3 IMPLANT

## 2016-06-16 NOTE — H&P (View-Only) (Signed)
Cardiology Office Note   Date:  06/05/2016   ID:  Lauren Bartlett, DOB 11/22/1955, MRN [id number]  PCP:  [name], MD  Cardiologist:  New  Chief Complaint  Patient presents with  . other    Follow up from Echo, the cardiac cath was cancelled. "doing well."  Meds reviewed by the pt. verbally.       History of Present Illness: Lauren Bartlett is a 61 y.o. female who is here today for a follow-up visit regarding mitral regurgitation. The patient has chronic medical conditions that include COPD, mitral valve prolapse, essential hypertension, anxiety and depression. She is a lifelong nonsmoker but reports being exposed to smoking. The patient had multiple recent emergency room visits for "COPD exacerbations". She is known to have moderate COPD followed by Dr. Alva Garnet.  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 She has history of sleep apnea with intolerance to CPAP. It was felt that her shortness of breath was out of proportion to pulmonary findings. Recent echocardiogram showed normal LV systolic function, grade 2 diastolic dysfunction, calcified mitral valve annulus with severe prolapse involving the posterior leaflet with severe anterior mitral regurgitation, moderately dilated left atrium and moderate to severe pulmonary hypertension with a systolic pulmonary pressure of 60 mmHg.  The patient is very limited due to shortness of breath with minimal activities, orthopnea and PND. She walks with a cane.  She was scheduled for TEE and a right and left cardiac catheterization. On the day of TEE, she was noted to be very dyspneic with low grade fever. Thus, the procedure was canceled and she was admitted to the hospital where she was diagnosed with influenza B. She recovered completely from that and she is back to baseline.   Past Medical History:  Diagnosis Date  . Anxiety   . Congenital absence of one kidney   . COPD (chronic obstructive pulmonary disease) ([place])   . Depression    treated at [facility]  . Emphysema/COPD ([place])   . History of sciatica   . Hypertension   . hypothyrodism   . Hypothyroidism   . Motion sickness    cars  . MVP (mitral valve prolapse)   . PONV (postoperative nausea and vomiting)   . Pulmonary hypertension   . Sleep apnea    O2 at night and PRN    Past Surgical History:  Procedure Laterality Date  . ABDOMINAL HYSTERECTOMY    . BREAST BIOPSY Right 2011   [facility]< benign  . CATARACT EXTRACTION W/PHACO Left 11/14/2015   Procedure: CATARACT EXTRACTION PHACO AND INTRAOCULAR LENS PLACEMENT (IOC);  Surgeon: Leandrew Koyanagi, MD;  Location: [facility];  Service: Ophthalmology;  Laterality: Left;  sleep apnea [place]  . CATARACT EXTRACTION W/PHACO Right 12/19/2015   Procedure: CATARACT EXTRACTION PHACO AND INTRAOCULAR LENS PLACEMENT (IOC);  Surgeon: Leandrew Koyanagi, MD;  Location: [facility];  Service: Ophthalmology;  Laterality: Right;  ANXIETY GENEROUS IV SEDATION TORIC LEN  . COMBINED HYSTERECTOMY ABDOMINAL W/ A&P REPAIR / OOPHORECTOMY  1996   benign tumor  . INNER EAR SURGERY     bilateral  . TEE WITHOUT CARDIOVERSION N/A 05/19/2016   Procedure: TRANSESOPHAGEAL ECHOCARDIOGRAM (TEE);  Surgeon: Wellington Hampshire, MD;  Location: [facility];  Service: Cardiovascular;  Laterality: N/A;     Current Outpatient Prescriptions  Medication Sig Dispense Refill  . albuterol (PROVENTIL HFA;VENTOLIN HFA) 108 (90 Base) MCG/ACT inhaler Inhale 2 puffs into the lungs every 6 (six) hours as needed for wheezing or shortness  of breath. Pt uses with SPACER.    [name] albuterol (PROVENTIL) (2.5 MG/3ML) 0.083% nebulizer solution Take 2.5 mg by nebulization every 6 (six) hours as needed for wheezing or shortness of breath.    . budesonide (PULMICORT) 0.5 MG/2ML nebulizer solution Take 2 mLs (0.5 mg total) by nebulization 2 (two) times daily. 50 mL 0  . fluticasone (FLONASE) 50 MCG/ACT nasal spray Place 2 sprays into both nostrils daily.    .  hydrochlorothiazide (HYDRODIURIL) 25 MG tablet TAKE 1/2 TABLET(12.5 MG) BY MOUTH DAILY 45 tablet 1  . hydroxypropyl methylcellulose / hypromellose (ISOPTO TEARS / GONIOVISC) 2.5 % ophthalmic solution Place 1 drop into both eyes 2 (two) times daily.    [name] levothyroxine (SYNTHROID, LEVOTHROID) 112 MCG tablet Take 112 mcg by mouth daily before breakfast.    . loratadine-pseudoephedrine (CLARITIN-D 24-HOUR) 10-240 MG 24 hr tablet Take 1 tablet by mouth daily.    [name] LORazepam (ATIVAN) 0.5 MG tablet Take 1 tablet (0.5 mg total) by mouth every 8 (eight) hours as needed for anxiety. 30 tablet 0  . losartan (COZAAR) 100 MG tablet TAKE 1 TABLET(100 MG) BY MOUTH DAILY 30 tablet 5  . montelukast (SINGULAIR) 10 MG tablet Take 1 tablet (10 mg total) by mouth at bedtime. 30 tablet 3  . OXYGEN Inhale 2 L into the lungs at bedtime as needed (at bedtime and as needed).    . polyethylene glycol powder (GLYCOLAX/MIRALAX) powder Take 17 g by mouth 2 (two) times daily as needed. 3350 g 1  . traMADol (ULTRAM) 50 MG tablet 1-2 tabs po q 6 hr prn pain Maximum dose= 8 tablets per day 20 tablet 0  . umeclidinium-vilanterol (ANORO ELLIPTA) 62.5-25 MCG/INH AEPB Inhale 1 puff into the lungs daily. 1 each 11  . fluticasone (FLOVENT HFA) 110 MCG/ACT inhaler Inhale 1 puff into the lungs 2 (two) times daily.     Current Facility-Administered Medications  Medication Dose Route Frequency Provider Last Rate Last Dose  . methylPREDNISolone acetate (DEPO-MEDROL) injection (RADIOLOGY ONLY) 80 mg  80 mg Intramuscular Once Burnard Hawthorne, FNP        Allergies:   Codeine; Penicillins; Zithromax [azithromycin]; and Prednisone    Social History:  The patient  reports that she has never smoked. She has never used smokeless tobacco. She reports that she does not drink alcohol or use drugs.   Family History:  The patient's family history includes Breast cancer in her paternal aunt; Coronary artery disease in her father; Heart disease in  her brother; Multiple sclerosis in her mother.    ROS:  Please see the history of present illness.   Otherwise, review of systems are positive for none.   All other systems are reviewed and negative.    PHYSICAL EXAM: VS:  BP 132/74 (BP Location: Left Arm, Patient Position: Sitting, Cuff Size: Normal)   Pulse 93   Ht 5\' 2"  (1.575 m)   Wt 220 lb (99.8 kg)   BMI 40.24 kg/m  , BMI Body mass index is 40.24 kg/m. GEN: Well nourished, well developed, in no acute distress  HEENT: normal  Neck: no JVD, carotid bruits, or masses Cardiac: RRR; no rubs, or gallops,no edema . 3/6 holosystolic murmur at the apex radiating to the left sternal border and the base of the heart. Respiratory:  Mild expiratory wheezing, normal work of breathing GI: soft, nontender, nondistended, + BS MS: no deformity or atrophy  Skin: warm and dry, no rash Neuro:  Strength and sensation are intact Psych:  euthymic mood, full affect   EKG:  EKG is not ordered today.   Recent Labs: 09/04/2015: B Natriuretic Peptide 82.0 01/04/2016: ALT 13 05/20/2016: Hemoglobin 11.1; Platelets 130 05/22/2016: BUN 11; Creatinine, Ser 0.73; Magnesium 1.9; Potassium 3.6; Sodium 141    Lipid Panel    Component Value Date/Time   CHOL 173 03/14/2015 1048   TRIG 85.0 03/14/2015 1048   HDL 57.10 03/14/2015 1048   CHOLHDL 3 03/14/2015 1048   VLDL 17.0 03/14/2015 1048   LDLCALC 99 03/14/2015 1048      Wt Readings from Last 3 Encounters:  06/05/16 220 lb (99.8 kg)  05/30/16 213 lb (96.6 kg)  05/19/16 219 lb (99.3 kg)      PAD Screen 05/13/2016  Previous PAD dx? No  Previous surgical procedure? No  Pain with walking? No  Feet/toe relief with dangling? No  Painful, non-healing ulcers? No  Extremities discolored? No      ASSESSMENT AND PLAN:  1.  Severe mitral regurgitation due to mitral valve prolapse: She appears to be symptomatic. I suspect that her dyspnea is multifactorial due to mitral regurgitation, COPD and physical  deconditioning. She had recent influenza and she has recovered completely from that. I'm going to reschedule a transesophageal echocardiogram followed by a right and left cardiac catheterization with anticipation of mitral valve repair as long as his COPD he is not severe enough to be prohibitive. I discussed the procedures and details as well as risks and benefits.   2. Pulmonary hypertension likely due to mitral regurgitation. This will be evaluated with a right heart catheterization.    Disposition:   FU with me in 1 month  Signed,  Kathlyn Sacramento, MD  06/05/2016 3:25 PM    [place]

## 2016-06-16 NOTE — Progress Notes (Signed)
*  PRELIMINARY RESULTS* Echocardiogram Echocardiogram Transesophageal has been performed.  Lauren Bartlett 06/16/2016, 8:11 AM

## 2016-06-16 NOTE — Progress Notes (Signed)
TR band removed and site dressed with gauze and tegaderm.

## 2016-06-16 NOTE — Transfer of Care (Signed)
Immediate Anesthesia Transfer of Care Note  Patient: Lauren Bartlett  Procedure(s) Performed: Procedure(s): TRANSESOPHAGEAL ECHOCARDIOGRAM (TEE) (N/A)  Patient Location: PACU  Anesthesia Type:General  Level of Consciousness: awake, alert  and oriented  Airway & Oxygen Therapy: Patient Spontanous Breathing and Patient connected to nasal cannula oxygen  Post-op Assessment: Report given to RN and Post -op Vital signs reviewed and stable  Post vital signs: Reviewed and stable  Last Vitals:  Vitals:   06/16/16 0805 06/16/16 0806  BP:  122/63  Pulse: 96 96  Resp: 16 15  Temp:      Last Pain:  Vitals:   06/16/16 0649  TempSrc: Oral         Complications: No apparent anesthesia complications

## 2016-06-16 NOTE — Discharge Instructions (Signed)
Radial Site Care Refer to this sheet in the next few weeks. These instructions provide you with information about caring for yourself after your procedure. Your health care provider may also give you more specific instructions. Your treatment has been planned according to current medical practices, but problems sometimes occur. Call your health care provider if you have any problems or questions after your procedure. What can I expect after the procedure? After your procedure, it is typical to have the following:  Bruising at the radial site that usually fades within 1-2 weeks.  Blood collecting in the tissue (hematoma) that may be painful to the touch. It should usually decrease in size and tenderness within 1-2 weeks. Follow these instructions at home:  Take medicines only as directed by your health care provider.  You may shower 24-48 hours after the procedure or as directed by your health care provider. Remove the bandage (dressing) and gently wash the site with plain soap and water. Pat the area dry with a clean towel. Do not rub the site, because this may cause bleeding.  Do not take baths, swim, or use a hot tub until your health care provider approves.  Check your insertion site every day for redness, swelling, or drainage.  Do not apply powder or lotion to the site.  Do not flex or bend the affected arm for 24 hours or as directed by your health care provider.  Do not push or pull heavy objects with the affected arm for 24 hours or as directed by your health care provider.  Do not lift over 10 lb (4.5 kg) for 5 days after your procedure or as directed by your health care provider.  Ask your health care provider when it is okay to:  Return to work or school.  Resume usual physical activities or sports.  Resume sexual activity.  Do not drive home if you are discharged the same day as the procedure. Have someone else drive you.  You may drive 24 hours after the procedure  unless otherwise instructed by your health care provider.  Do not operate machinery or power tools for 24 hours after the procedure.  If your procedure was done as an outpatient procedure, which means that you went home the same day as your procedure, a responsible adult should be with you for the first 24 hours after you arrive home.  Keep all follow-up visits as directed by your health care provider. This is important. Contact a health care provider if:  You have a fever.  You have chills.  You have increased bleeding from the radial site. Hold pressure on the site. Get help right away if:  You have unusual pain at the radial site.  You have redness, warmth, or swelling at the radial site.  You have drainage (other than a small amount of blood on the dressing) from the radial site.  The radial site is bleeding, and the bleeding does not stop after 30 minutes of holding steady pressure on the site.  Your arm or hand becomes pale, cool, tingly, or numb. This information is not intended to replace advice given to you by your health care provider. Make sure you discuss any questions you have with your health care provider. Document Released: 04/19/2010 Document Revised: 08/23/2015 Document Reviewed: 10/03/2013 Elsevier Interactive Patient Education  2017 [place] del sitio del radio (Radial Site Care) Siga estas instrucciones durante las prximas semanas. Estas indicaciones le proporcionan informacin acerca de cmo deber cuidarse despus del  procedimiento. El mdico tambin podr darle instrucciones ms especficas. El tratamiento ha sido planificado segn las prcticas mdicas actuales, pero en algunos casos pueden ocurrir problemas. Comunquese con el mdico si tiene algn problema o tiene dudas despus del procedimiento. QU ESPERAR DESPUS DEL PROCEDIMIENTO Despus del procedimiento, es normal tener lo siguiente:  Hematomas en el sitio del radio que suelen  desaparecer en el trmino de 1 o 2semanas.  Acumulacin de sangre en el tejido (hematoma) que puede ser dolorosa al tacto. Generalmente, en 1 o 2 semanas deben disminuir su tamao y el dolor que produce con la palpacin. [place] los medicamentos solamente como se lo haya indicado el mdico.  Puede ducharse 24a 48horas despus del procedimiento o como se lo haya indicado el mdico. Retire el vendaje (apsito) y limpie suavemente el lugar de la insercin con agua y jabn comn. Seque bien el rea con una toalla limpia dando golpecitos. No frote el lugar, ya que Therapist, art.  No tome baos de inmersin, no nade ni use el jacuzzi hasta que el mdico lo autorice.  Revise diariamente el lugar de la insercin para ver si aparece enrojecimiento, hinchazn o secrecin.  No se aplique talcos ni lociones en el lugar.  No flexione ni doble el brazo afectado durante 24horas o como se lo haya indicado el mdico.  No haga esfuerzos ni levante objetos pesados con el brazo afectado durante 24horas o como se lo haya indicado el mdico.  No levante objetos que pesen ms de 10libras (4,5kg) durante los 5das posteriores al procedimiento o como se lo haya indicado el mdico.  Pregntele al mdico cundo puede hacer lo siguiente:  Regresar a la escuela o al [place].  Reanudar las actividades fsicas o los deportes que practica habitualmente.  Reanudar la actividad sexual.  No conduzca el automvil por sus propios medios si le dan el alta el mismo da del procedimiento. Pdale a otra persona que lo lleve.  Puede conducir 24horas despus del procedimiento, a menos que el mdico le haya indicado lo contrario.  No opere maquinaria ni herramientas elctricas durante 24horas despus del procedimiento.  Si el procedimiento se hizo de [employer], lo que significa que regres a su casa el mismo da de su realizacin, un adulto responsable debe  acompaarlo durante las primeras 24horas.  Concurra a todas las visitas de control como se lo haya indicado el mdico. Esto es importante. SOLICITE ATENCIN MDICA SI:  Jaclynn Guarneri.  Tiene escalofros.  Aumenta el sangrado en el sitio del radio. Haga presin Mudlogger. SOLICITE ATENCIN MDICA DE INMEDIATO SI:  Tiene un dolor que no es habitual en el sitio del radio.  Nota que el sitio del radio est enrojecido, caliente o hinchado.  Tiene secrecin (que no es una pequea cantidad de sangre en el vendaje) en el sitio del radio.  El sitio del radio est sangrando, y el sangrado no se detiene despus de 108minutos de Oceanographer una presin constante.  El brazo o la mano se le ponen plidos, fros o siente hormigueo o adormecimiento. Esta informacin no tiene Marine scientist el consejo del mdico. Asegrese de hacerle al mdico cualquier pregunta que tenga. Document Released: 07/12/2010 Document Revised: 04/07/2014 Document Reviewed: 10/03/2013 Elsevier Interactive Patient Education  2017 [employer].

## 2016-06-16 NOTE — Interval H&P Note (Signed)
History and Physical Interval Note:  06/16/2016 9:56 AM  Lauren Bartlett  has presented today for surgery, with the diagnosis of RT and LT cath    Mitral valve disease  The various methods of treatment have been discussed with the patient and family. After consideration of risks, benefits and other options for treatment, the patient has consented to  Procedure(s): Right/Left Heart Cath and Coronary Angiography (Bilateral) as a surgical intervention .  The patient's history has been reviewed, patient examined, no change in status, stable for surgery.  I have reviewed the patient's chart and labs.  Questions were answered to the patient's satisfaction.     Kathlyn Sacramento

## 2016-06-16 NOTE — Anesthesia Preprocedure Evaluation (Signed)
Anesthesia Evaluation  Patient identified by MRN, date of birth, ID band Patient awake    Reviewed: Allergy & Precautions, NPO status , Patient's Chart, lab work & pertinent test results, reviewed documented beta blocker date and time   History of Anesthesia Complications (+) PONV  Airway Mallampati: II  TM Distance: >3 FB     Dental  (+) Chipped   Pulmonary sleep apnea , COPD,           Cardiovascular hypertension, Pt. on medications      Neuro/Psych PSYCHIATRIC DISORDERS Anxiety Depression    GI/Hepatic   Endo/Other  Hypothyroidism   Renal/GU      Musculoskeletal   Abdominal   Peds  Hematology   Anesthesia Other Findings O2 at nite. Hypertensive this am.  Reproductive/Obstetrics                             Anesthesia Physical Anesthesia Plan  ASA: III  Anesthesia Plan: General   Post-op Pain Management:    Induction: Intravenous  Airway Management Planned:   Additional Equipment:   Intra-op Plan:   Post-operative Plan:   Informed Consent: I have reviewed the patients History and Physical, chart, labs and discussed the procedure including the risks, benefits and alternatives for the proposed anesthesia with the patient or authorized representative who has indicated his/her understanding and acceptance.     Plan Discussed with: CRNA  Anesthesia Plan Comments:         Anesthesia Quick Evaluation

## 2016-06-16 NOTE — Telephone Encounter (Signed)
Per [name], pt needs pulmonary clearance for possible mitral valve repair surgery. Scheduled March 20, 11:30am w/Dr. Alva Garnet. Left detailed message on pt's home VM. Requested call back to confirm.

## 2016-06-16 NOTE — Interval H&P Note (Signed)
History and Physical Interval Note:  06/16/2016 8:10 AM  Lauren Bartlett  has presented today for surgery, with the diagnosis of TEE    General anesthesia per Dr Fletcher Anon    Mitral valve disease  The various methods of treatment have been discussed with the patient and family. After consideration of risks, benefits and other options for treatment, the patient has consented to  Procedure(s): TRANSESOPHAGEAL ECHOCARDIOGRAM (TEE) (N/A) as a surgical intervention .  The patient's history has been reviewed, patient examined, no change in status, stable for surgery.  I have reviewed the patient's chart and labs.  Questions were answered to the patient's satisfaction.     Kathlyn Sacramento

## 2016-06-16 NOTE — Anesthesia Post-op Follow-up Note (Cosign Needed)
Anesthesia QCDR form completed.        

## 2016-06-16 NOTE — Anesthesia Postprocedure Evaluation (Signed)
Anesthesia Post Note  Patient: Lauren Bartlett  Procedure(s) Performed: Procedure(s) (LRB): TRANSESOPHAGEAL ECHOCARDIOGRAM (TEE) (N/A)  Patient location during evaluation: Other Anesthesia Type: General Level of consciousness: awake and alert Pain management: pain level controlled Vital Signs Assessment: post-procedure vital signs reviewed and stable Respiratory status: spontaneous breathing, nonlabored ventilation, respiratory function stable and patient connected to nasal cannula oxygen Cardiovascular status: blood pressure returned to baseline and stable Postop Assessment: no signs of nausea or vomiting Anesthetic complications: no     Last Vitals:  Vitals:   06/16/16 0830 06/16/16 0845  BP: 129/68   Pulse: 88 86  Resp: 15 14  Temp:      Last Pain:  Vitals:   06/16/16 0649  TempSrc: Oral                 Malakie Balis S

## 2016-06-16 NOTE — Anesthesia Procedure Notes (Signed)
Date/Time: 06/16/2016 7:47 AM Performed by: Darlyne Russian Pre-anesthesia Checklist: Patient identified, Emergency Drugs available, Suction available, Patient being monitored and Timeout performed Patient Re-evaluated:Patient Re-evaluated prior to inductionOxygen Delivery Method: Nasal cannula Placement Confirmation: positive ETCO2

## 2016-06-17 ENCOUNTER — Encounter: Payer: Self-pay | Admitting: Cardiovascular Disease

## 2016-06-17 ENCOUNTER — Ambulatory Visit: Payer: Medicare Other | Admitting: Pulmonary Disease

## 2016-06-17 ENCOUNTER — Telehealth: Payer: Self-pay | Admitting: Pulmonary Disease

## 2016-06-17 NOTE — Telephone Encounter (Signed)
Attempted to contact pt again regarding pulmonary appt. No answer. I have cancelled today's appt and will await call back to reschedule.

## 2016-06-17 NOTE — Telephone Encounter (Signed)
Patient needs pre op clearance per [name].  Is there anywhere to add her on any sooner with simonds ?

## 2016-06-17 NOTE — Telephone Encounter (Signed)
Pt reports she was called regarding March 26 pulmonary appt. She is agreeable with plan.

## 2016-06-17 NOTE — Telephone Encounter (Signed)
Pt returned call and states she can not make 11:30 appt today.  Message has been sent to Dr. Reine Just for sooner appt as May is first available.

## 2016-06-17 NOTE — Telephone Encounter (Signed)
Schedule pt for 06/23/16 at 12pm with Ramachandran and tell pt to be here at 11:45am please. Put it in that pt needs surgical clearance. Thanks.

## 2016-06-20 DIAGNOSIS — R531 Weakness: Secondary | ICD-10-CM | POA: Diagnosis not present

## 2016-06-20 DIAGNOSIS — J441 Chronic obstructive pulmonary disease with (acute) exacerbation: Secondary | ICD-10-CM | POA: Diagnosis not present

## 2016-06-22 NOTE — Progress Notes (Signed)
PULMONARY     PT PROFILE: 61 y.o. F never smoker with significant lifetime exposure to second hand smoke with multiple medical problems including chronic hypoxic respiratory failure, moderate COPD, mitral valve prolapse and regurgitation, pulm htn by echocardiogram, appearance of tracheomalacia on CT chest previously followed by Dr Raul Del and referred for further evaluation of severe exertional limitation.   Lauren Bartlett normally sees Dr. Alva Garnet, Lauren Bartlett returned today for pre-op pulmonary clearance. Since Lauren Bartlett saw Dr. Alva Garnet, Lauren Bartlett was admitted to the hospital for 4 days and discharged on 05/23/16 for AECOPD and flu B.  Lauren Bartlett continues to have tightness and congestion in her lungs. Lauren Bartlett is using anoro once per day, Lauren Bartlett is taking flovent 1 puff bid. Lauren Bartlett uses albuterol neb three times daily Lauren Bartlett takes an occasional extra dose due to dyspnea and wheezing.  Lauren Bartlett is planned to have a valve replacement. Lauren Bartlett can not take prednisone because it makes her hallucinate.       DATA: Echocardiogram 02/01/13: Normal LVEF. MVP with moderate MR, Moderate AI, severe TI. RVSP est 91 mmHg CT chest 07/29/15: Cardiomegaly with left atrial dilatation. Tracheobronchomalacia. Air trapping in the lungs bilaterally, indicative of small airways disease Echocardiogram 06/13/14: Normal LVEF, Severe MR, RVSP est 62 mmHg Spirometry 03/12/15: FVC 1.94 L (73% pred). FEV1 1.19 L (55% pred), FEV1/FVC 61% 6MWT 03/12/15: 225 meters, no desaturation LE venous US 07/29/15: No DVT Echocardiogram 07/31/15: LVEF 60-65%. MV poorly visualized, trivial MR, LA size normal, TV poorly visualized, RVSP not estimated CT chest 03/11/16: Stable changes of tracheobronchomalacia  HPI:  As above. Lauren Bartlett is a somewhat difficult historian. Lauren Bartlett has been extensively evaluated and closely followed by Dr Raul Del and is also followed by [facility]. Lauren Bartlett reports class III dyspnea @ baseline. Lauren Bartlett has a history of OSA and was intolerant to CPAP attempts. Lauren Bartlett  was started on O2 in 2015 and wears it with sleep and "as needed" during the daytime. Lauren Bartlett has a history of "aspiration attacks". It has not been explained why Lauren Bartlett has suffered recurrent aspiration. Lauren Bartlett has no significant occupational or environmental exposures. Lauren Bartlett has been on disability for many years.   Past Medical History:  Diagnosis Date  . Anxiety   . Congenital absence of one kidney   . COPD (chronic obstructive pulmonary disease) ([place])   . Depression    treated at [place]  . Emphysema/COPD ([place])   . History of sciatica   . Hypertension   . hypothyrodism   . Hypothyroidism   . Motion sickness    cars  . MVP (mitral valve prolapse)   . PONV (postoperative nausea and vomiting)   . Pulmonary hypertension   . Sleep apnea    O2 at night and PRN    Past Surgical History:  Procedure Laterality Date  . ABDOMINAL HYSTERECTOMY    . BREAST BIOPSY Right 2011   [facility]< benign  . CATARACT EXTRACTION W/PHACO Left 11/14/2015   Procedure: CATARACT EXTRACTION PHACO AND INTRAOCULAR LENS PLACEMENT (IOC);  Surgeon: Leandrew Koyanagi, MD;  Location: [place];  Service: Ophthalmology;  Laterality: Left;  sleep apnea [place]  . CATARACT EXTRACTION W/PHACO Right 12/19/2015   Procedure: CATARACT EXTRACTION PHACO AND INTRAOCULAR LENS PLACEMENT (IOC);  Surgeon: Leandrew Koyanagi, MD;  Location: [place];  Service: Ophthalmology;  Laterality: Right;  ANXIETY GENEROUS IV SEDATION TORIC LEN  . COMBINED HYSTERECTOMY ABDOMINAL W/ A&P REPAIR / OOPHORECTOMY  1996   benign tumor  . INNER EAR SURGERY     bilateral  .  RIGHT/LEFT HEART CATH AND CORONARY ANGIOGRAPHY Bilateral 06/16/2016   Procedure: Right/Left Heart Cath and Coronary Angiography;  Surgeon: Wellington Hampshire, MD;  Location: [place] [facility];  Service: Cardiovascular;  Laterality: Bilateral;  . TEE WITHOUT CARDIOVERSION N/A 05/19/2016   Procedure: TRANSESOPHAGEAL ECHOCARDIOGRAM (TEE);  Surgeon: Wellington Hampshire, MD;   Location: [facility];  Service: Cardiovascular;  Laterality: N/A;  . TEE WITHOUT CARDIOVERSION N/A 06/09/2016   Procedure: TRANSESOPHAGEAL ECHOCARDIOGRAM (TEE);  Surgeon: Wellington Hampshire, MD;  Location: [facility];  Service: Cardiovascular;  Laterality: N/A;  . TEE WITHOUT CARDIOVERSION N/A 06/16/2016   Procedure: TRANSESOPHAGEAL ECHOCARDIOGRAM (TEE);  Surgeon: Wellington Hampshire, MD;  Location: [facility];  Service: Cardiovascular;  Laterality: N/A;    MEDICATIONS: I have reviewed all medications and confirmed regimen as documented  Social History   Social History  . Marital status: Single    Spouse name: N/A  . Number of children: N/A  . Years of education: N/A   Occupational History  . cna     part time   Social History Main Topics  . Smoking status: Never Smoker  . Smokeless tobacco: Never Used  . Alcohol use No  . Drug use: No  . Sexual activity: Not Currently   Other Topics Concern  . Not on file   Social History Narrative   Lives in a group home for adults          Family History  Problem Relation Age of Onset  . Multiple sclerosis Mother   . Coronary artery disease Father   . Heart disease Brother   . Breast cancer Paternal Aunt     ROS: No fever, myalgias/arthralgias, unexplained weight loss or weight gain No new focal weakness or sensory deficits No otalgia, hearing loss, visual changes, nasal and sinus symptoms, mouth and throat problems No neck pain or adenopathy No abdominal pain, N/V/D, diarrhea, change in bowel pattern No dysuria, change in urinary pattern   Vitals:   06/23/16 1145  BP: 138/82  Pulse: 98  SpO2: 94%  Weight: 216 lb (98 kg)     EXAM:  Gen: Frail-appearing, No overt respiratory distress HEENT: NCAT, sclera white, oropharynx normal Neck: Supple without LAN, thyromegaly. JVP is not well visualized @ 90 degrees Lungs: BS full, slightly coarse with a few scattered wheezes, decreased air entry bilaterally.  Cardiovascular: Reg,  III-IV/VI holosystolic M heard across precordium Abdomen: Soft, nontender, normal BS Ext: without clubbing, cyanosis, edema Neuro: CNs grossly intact, motor and sensory intact Skin: Limited exam, no lesions noted  DATA:   BMP Latest Ref Rng & Units 06/05/2016 05/22/2016 05/20/2016  Glucose 65 - 99 mg/dL 114(H) 82 142(H)  BUN 6 - 20 mg/dL 19 11 20   Creatinine 0.44 - 1.00 mg/dL 0.70 0.73 0.84  BUN/Creat Ratio 12 - 28 - - -  Sodium 135 - 145 mmol/L 140 141 139  Potassium 3.5 - 5.1 mmol/L 3.7 3.6 4.1  Chloride 101 - 111 mmol/L 104 103 105  CO2 22 - 32 mmol/L 29 31 27   Calcium 8.9 - 10.3 mg/dL 8.1(L) 7.0(L) 7.5(L)    CBC Latest Ref Rng & Units 06/05/2016 05/20/2016 05/19/2016  WBC 3.6 - 11.0 K/uL 8.6 3.1(L) 5.0  Hemoglobin 12.0 - 16.0 g/dL 11.5(L) 11.1(L) 11.4(L)  Hematocrit 35.0 - 47.0 % 34.3(L) 33.3(L) 34.0(L)  Platelets 150 - 440 K/uL 180 130(L) 140(L)    CXR (02/26/16): [facility]    IMPRESSION:   --COPD with acute bronchitis  --Sinus drainage.  PLAN:  1) Continue current medical regimen of Anoro, Flovent and PRN albuterol --Ipratropium nasal spray 2 sprays in each nostril three times daily for 1 month.  --Doxycyline 100 mg bid. (azithromycin causes hallucination).   --The patient is not yet cleared for surgery, as Lauren Bartlett appears to be having an exacerbation. Will treat as above, have her call back in 1 week, if feeling better may proceed.    Marda Stalker MD PCCM service Pager [phone] 06/23/2016

## 2016-06-23 ENCOUNTER — Encounter: Payer: Self-pay | Admitting: Internal Medicine

## 2016-06-23 ENCOUNTER — Ambulatory Visit (INDEPENDENT_AMBULATORY_CARE_PROVIDER_SITE_OTHER): Payer: Medicare Other | Admitting: Internal Medicine

## 2016-06-23 ENCOUNTER — Other Ambulatory Visit: Payer: Self-pay | Admitting: Internal Medicine

## 2016-06-23 VITALS — BP 138/82 | HR 98 | Wt 216.0 lb

## 2016-06-23 DIAGNOSIS — J398 Other specified diseases of upper respiratory tract: Secondary | ICD-10-CM | POA: Diagnosis not present

## 2016-06-23 DIAGNOSIS — J449 Chronic obstructive pulmonary disease, unspecified: Secondary | ICD-10-CM | POA: Diagnosis not present

## 2016-06-23 MED ORDER — DOXYCYCLINE HYCLATE 100 MG PO TABS: 100.0000 mg | ORAL_TABLET | Freq: Two times a day (BID) | ORAL | 0 refills | Status: DC

## 2016-06-23 MED ORDER — IPRATROPIUM BROMIDE 0.03 % NA SOLN: 2.0000 | Freq: Three times a day (TID) | NASAL | 1 refills | Status: DC

## 2016-06-23 NOTE — Patient Instructions (Addendum)
Continue current medical regimen of Anoro, Flovent and PRN albuterol --Ipratropium nasal spray 2 sprays in each nostril three times daily for 1 month.  --Doxycyline 100 mg bid for 7 days.   --Sputum sample for culture.   --The patient is not yet cleared for surgery, as she appears to be having an exacerbation. Will treat as above, have her call back in 1 week, if feeling better may proceed.

## 2016-06-23 NOTE — Addendum Note (Signed)
Addended by: Oscar La R on: 06/23/2016 12:26 PM   Modules accepted: Orders

## 2016-06-24 ENCOUNTER — Institutional Professional Consult (permissible substitution) (INDEPENDENT_AMBULATORY_CARE_PROVIDER_SITE_OTHER): Payer: Medicare Other | Admitting: Thoracic Surgery (Cardiothoracic Vascular Surgery)

## 2016-06-24 ENCOUNTER — Encounter: Payer: Self-pay | Admitting: Thoracic Surgery (Cardiothoracic Vascular Surgery)

## 2016-06-24 ENCOUNTER — Other Ambulatory Visit: Payer: Self-pay | Admitting: *Deleted

## 2016-06-24 ENCOUNTER — Other Ambulatory Visit: Payer: Self-pay | Admitting: Thoracic Surgery (Cardiothoracic Vascular Surgery)

## 2016-06-24 VITALS — BP 128/76 | HR 92 | Resp 20 | Ht 62.0 in | Wt 218.0 lb

## 2016-06-24 DIAGNOSIS — I272 Pulmonary hypertension, unspecified: Secondary | ICD-10-CM

## 2016-06-24 DIAGNOSIS — I340 Nonrheumatic mitral (valve) insufficiency: Secondary | ICD-10-CM | POA: Diagnosis not present

## 2016-06-24 DIAGNOSIS — I341 Nonrheumatic mitral (valve) prolapse: Secondary | ICD-10-CM

## 2016-06-24 DIAGNOSIS — Z01818 Encounter for other preprocedural examination: Secondary | ICD-10-CM

## 2016-06-24 DIAGNOSIS — I5032 Chronic diastolic (congestive) heart failure: Secondary | ICD-10-CM

## 2016-06-24 DIAGNOSIS — I71019 Dissection of thoracic aorta, unspecified: Secondary | ICD-10-CM

## 2016-06-24 DIAGNOSIS — I7101 Dissection of thoracic aorta: Secondary | ICD-10-CM

## 2016-06-24 DIAGNOSIS — I7409 Other arterial embolism and thrombosis of abdominal aorta: Secondary | ICD-10-CM

## 2016-06-24 MED ORDER — AMIODARONE HCL 200 MG PO TABS: 200.0000 mg | ORAL_TABLET | Freq: Every day | ORAL | 0 refills | Status: DC

## 2016-06-24 NOTE — Progress Notes (Signed)
Lauren Bartlett       Lauren Bartlett,Lauren Bartlett             [phone]     CARDIOTHORACIC SURGERY CONSULTATION REPORT  Referring Provider is Wellington Hampshire, MD PCP is [name], MD  Chief Complaint  Patient presents with  . Mitral Valve Prolapse    Surgical eval, Cardiac Cath and TEE 06/16/16    HPI:  Patient is a 61 year old morbidly obese white female with history of mitral regurgitation caused by mitral valve prolapse, chronic respiratory failure that had previously been attributed to COPD, essential hypertension, and anxiety and depression who has been referred for surgical consultation to discuss treatment options for management of severe symptomatically mitral regurgitation.   The patient states that she first began to experience problems with severe exertional shortness of breath 2 or 3 years ago. Symptoms progressed and she has been evaluated on numerous occasions and hospitalized on numerous occasions with acute exacerbation of chronic respiratory failure which have been previously attributed to COPD. The patient is a lifelong nonsmoker although she admits that she had been exposed to some tobacco smoking in the past. She was evaluated by [name] in December and found to have moderate COPD and some tracheomalacia. [name] felt that the patient's shortness of breath with disproportionate to her pulmonary findings. An echocardiogram was performed demonstrating mitral valve prolapse with severe mitral regurgitation. Left ventricular systolic function was normal. She was referred to [name] who first evaluated the patient on [name]. The patient subsequently underwent TEE and diagnostic cardiac catheterization on 06/16/2016. TEE confirmed the presence of mitral valve prolapse with a large flail segment involving the middle scallop of the posterior leaflet and severe mitral regurgitation.  Left ventricular function appeared normal with ejection  fraction estimated 60-65%.  Left and right heart catheterization confirmed the presence of severe mitral regurgitation. There was no significant coronary artery disease.  Left ventricular function appeared normal. There was moderate pulmonary hypertension. The patient was referred for surgical consultation.  The patient is single and lives alone in [place]. She has several family members who live nearby including her father and some nieces and nephews. She has no children.  She has been disabled for approximately 5 or 6 years secondary to problems with back pain, anxiety, and severe depression. 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 She lives a sedentary lifestyle. She still drives an automobile and she tends to the majority of her basic needs although she does have a home health aide who comes by 5 days a week to assist. She describes a several year history of worsening symptoms of shortness of breath. Over the past several months the patient has had problems with severe shortness of breath with very mild low level activities. She gets frequent congestion with problems with chronic orthopnea and occasional PND. She has been hospitalized on numerous occasions with acute exacerbations of respiratory failure. She has had problems with lower extremity edema. She denies any history of chest pain or chest tightness although she does experience "congestion" when she is more short of breath. She has not had palpitations, dizzy spells, nor syncope. She has been using oxygen at night recently and she has a chronic dry nonproductive cough with occasional wheezing. Her mobility is somewhat limited because of generalized weakness and physical deconditioning. She walks using a rolling walker or cane for assistance.  Past Medical History:  Diagnosis Date  . Anxiety   . Chronic diastolic congestive heart failure ([place])   . Congenital absence of one kidney   . COPD  (chronic obstructive pulmonary disease) ([place])   . Depression    treated at [facility]  . Emphysema/COPD ([place])   . History of sciatica   . Hypertension   . hypothyrodism   . Hypothyroidism   . Motion sickness    cars  . MVP (mitral valve prolapse)   . PONV (postoperative nausea and vomiting)   . Pulmonary hypertension   . Severe mitral regurgitation   . Sleep apnea    O2 at night and PRN    Past Surgical History:  Procedure Laterality Date  . ABDOMINAL HYSTERECTOMY    . BREAST BIOPSY Right 2011   [facility]< benign  . CATARACT EXTRACTION W/PHACO Left 11/14/2015   Procedure: CATARACT EXTRACTION PHACO AND INTRAOCULAR LENS PLACEMENT (IOC);  Surgeon: Leandrew Koyanagi, MD;  Location: [facility];  Service: Ophthalmology;  Laterality: Left;  sleep apnea [place]  . CATARACT EXTRACTION W/PHACO Right 12/19/2015   Procedure: CATARACT EXTRACTION PHACO AND INTRAOCULAR LENS PLACEMENT (IOC);  Surgeon: Leandrew Koyanagi, MD;  Location: [facility];  Service: Ophthalmology;  Laterality: Right;  ANXIETY GENEROUS IV SEDATION TORIC LEN  . COMBINED HYSTERECTOMY ABDOMINAL W/ A&P REPAIR / OOPHORECTOMY  1996   benign tumor  . INNER EAR SURGERY     bilateral  . RIGHT/LEFT HEART CATH AND CORONARY ANGIOGRAPHY Bilateral 06/16/2016   Procedure: Right/Left Heart Cath and Coronary Angiography;  Surgeon: Wellington Hampshire, MD;  Location: [facility];  Service: Cardiovascular;  Laterality: Bilateral;  . TEE WITHOUT CARDIOVERSION N/A 05/19/2016   Procedure: TRANSESOPHAGEAL ECHOCARDIOGRAM (TEE);  Surgeon: Wellington Hampshire, MD;  Location: [facility];  Service: Cardiovascular;  Laterality: N/A;  . TEE WITHOUT CARDIOVERSION N/A 06/09/2016   Procedure: TRANSESOPHAGEAL ECHOCARDIOGRAM (TEE);  Surgeon: Wellington Hampshire, MD;  Location: [facility];  Service: Cardiovascular;  Laterality: N/A;  . TEE WITHOUT CARDIOVERSION N/A 06/16/2016   Procedure: TRANSESOPHAGEAL ECHOCARDIOGRAM (TEE);  Surgeon: Wellington Hampshire, MD;  Location: [facility];  Service: Cardiovascular;  Laterality: N/A;    Family History  Problem Relation Age of Onset  . Multiple sclerosis Mother   . Coronary artery disease Father   . Heart disease Brother   . Breast cancer Paternal Aunt     Social History   Social History  . Marital status: Single    Spouse name: N/A  . Number of children: N/A  . Years of education: N/A   Occupational History  . cna     part time   Social History Main Topics  . Smoking status: Never Smoker  . Smokeless tobacco: Never Used  . Alcohol use No  . Drug use: No  . Sexual activity: Not Currently   Other Topics Concern  . Not on file   Social History Narrative   Lives in a group home for adults          Current Outpatient Prescriptions  Medication Sig Dispense Refill  . acetaminophen (TYLENOL) 500 MG tablet Take 500 mg by mouth 2 (two) times daily as needed for moderate pain or headache.    . albuterol (PROVENTIL HFA;VENTOLIN HFA) 108 (90 Base) MCG/ACT inhaler Inhale 2 puffs into the lungs every 6 (six) hours as needed for wheezing or shortness of breath. Pt uses with SPACER.    [name] albuterol (PROVENTIL) (2.5 MG/3ML) 0.083% nebulizer solution Take 2.5  mg by nebulization every 6 (six) hours as needed for wheezing or shortness of breath.    . doxycycline (VIBRA-TABS) 100 MG tablet Take 1 tablet (100 mg total) by mouth 2 (two) times daily. 14 tablet 0  . famotidine (PEPCID AC) 10 MG chewable tablet Chew 10 mg by mouth daily as needed for heartburn.    . fluticasone (FLONASE) 50 MCG/ACT nasal spray Place 2 sprays into both nostrils daily.    . fluticasone (FLOVENT HFA) 110 MCG/ACT inhaler Inhale 1 puff into the lungs 2 (two) times daily.    . hydrochlorothiazide (HYDRODIURIL) 25 MG tablet TAKE 1/2 TABLET(12.5 MG) BY MOUTH DAILY 45 tablet 1  . hydroxypropyl methylcellulose / hypromellose (ISOPTO TEARS / GONIOVISC) 2.5 % ophthalmic solution Place 1 drop into both eyes 2 (two) times daily.     [name] ipratropium (ATROVENT) 0.03 % nasal spray USE 2 SPRAYS NASALLY THREE TIMES DAILY 90 mL 1  . levothyroxine (SYNTHROID, LEVOTHROID) 112 MCG tablet Take 112 mcg by mouth daily before breakfast.    . loratadine-pseudoephedrine (CLARITIN-D 24-HOUR) 10-240 MG 24 hr tablet Take 1 tablet by mouth daily.    [name] LORazepam (ATIVAN) 0.5 MG tablet Take 1 tablet (0.5 mg total) by mouth every 8 (eight) hours as needed for anxiety. (Patient taking differently: Take 0.5 mg by mouth daily as needed for anxiety. ) 30 tablet 0  . losartan (COZAAR) 100 MG tablet TAKE 1 TABLET(100 MG) BY MOUTH DAILY 30 tablet 5  . montelukast (SINGULAIR) 10 MG tablet Take 1 tablet (10 mg total) by mouth at bedtime. 30 tablet 3  . OXYGEN Inhale 2 L into the lungs at bedtime as needed (at bedtime and as needed).    . polyethylene glycol powder (GLYCOLAX/MIRALAX) powder Take 17 g by mouth 2 (two) times daily as needed. (Patient taking differently: Take 17 g by mouth 2 (two) times daily as needed for mild constipation. ) 3350 g 1  . traMADol (ULTRAM) 50 MG tablet 1-2 tabs po q 6 hr prn pain Maximum dose= 8 tablets per day (Patient taking differently: Take 50-100 mg by mouth 3 (three) times daily as needed for severe pain. ) 20 tablet 0  . umeclidinium-vilanterol (ANORO ELLIPTA) 62.5-25 MCG/INH AEPB Inhale 1 puff into the lungs daily. 1 each 11   No current facility-administered medications for this visit.     Allergies  Allergen Reactions  . Codeine Hives and Nausea And Vomiting  . Levaquin [Levofloxacin] Nausea And Vomiting  . Penicillins Hives, Itching and Other (See Comments)    Has patient had a PCN reaction causing immediate rash, facial/tongue/throat swelling, SOB or lightheadedness with hypotension: No Has patient had a PCN reaction causing severe rash involving mucus membranes or skin necrosis: No Has patient had a PCN reaction that required hospitalization No Has patient had a PCN reaction occurring within the last 10 years:  No If all of the above answers are "NO", then may proceed with Cephalosporin use.  [name] Zithromax [Azithromycin] Other (See Comments)    Reaction:  Hallucinations   . Prednisone Palpitations and Other (See Comments)    Reaction:  Hallucinations       Review of Systems:   General:  normal appetite, decreased energy, + weight gain, no weight loss, no fever  Cardiac:  no chest pain with exertion, no chest pain at rest, + SOB with exertion, occasional resting SOB, + PND, + orthopnea, no palpitations, no arrhythmia, no atrial fibrillation, + LE edema, no dizzy spells, no syncope  Respiratory:  +  shortness of breath, + home oxygen, no productive cough, + dry cough, no bronchitis, + wheezing, no hemoptysis, no asthma, no pain with inspiration or cough, + sleep apnea, no CPAP at night  GI:   no difficulty swallowing, no reflux, no frequent heartburn, no hiatal hernia, no abdominal pain, no constipation, no diarrhea, no hematochezia, no hematemesis, no melena  GU:   no dysuria,  no frequency, no urinary tract infection, no hematuria, no kidney stones, no kidney disease but h/o congenital solitary kidney  Vascular:  no pain suggestive of claudication, no pain in feet, no leg cramps, no varicose veins, no DVT, no non-healing foot ulcer  Neuro:   no stroke, no TIA's, no seizures, no headaches, no temporary blindness one eye,  no slurred speech, no peripheral neuropathy, + chronic pain, + instability of gait, no memory/cognitive dysfunction  Musculoskeletal: + arthritis, no joint swelling, no myalgias, some difficulty walking, decreased mobility   Skin:   no rash, no itching, no skin infections, no pressure sores or ulcerations  Psych:   + anxiety, + depression, no nervousness, no unusual recent stress  Eyes:   no blurry vision, no floaters, no recent vision changes, does not wear glasses or contacts  ENT:   + hearing loss, no loose or painful teeth, no dentures, last saw dentist 5 months  ago  Hematologic:  + easy bruising, no abnormal bleeding, no clotting disorder, + frequent epistaxis  Endocrine:  no diabetes, does not check CBG's at home     Physical Exam:   BP 128/76   Pulse 92   Resp 20   Ht 5\' 2"  (1.575 m)   Wt 218 lb (98.9 kg)   SpO2 94% Comment: RA  BMI 39.87 kg/m   General:  Morbidly obese, o/w  well-appearing  HEENT:  Unremarkable   Neck:   no JVD, no bruits, no adenopathy   Chest:   clear to auscultation, symmetrical breath sounds, no wheezes, no rhonchi   CV:   RRR, grade III/VI holosystolic murmur   Abdomen:  soft, non-tender, no masses   Extremities:  warm, well-perfused, pulses not palpable, mild LE edema  Rectal/GU  Deferred  Neuro:   Grossly non-focal and symmetrical throughout  Skin:   Clean and dry, no rashes, no breakdown   Diagnostic Tests:  Transthoracic Echocardiography  Patient:    Lauren Bartlett, Lauren Bartlett MR #:       [id number] Study Date: 04/15/2016 Gender:     F Age:        60 Height:     152.4 cm Weight:     96.6 kg BSA:        2.08 m^2 Pt. Status: Room:   ATTENDING    Liberty Handy  REFERRING    [place], [place],   SONOGRAPHER  Pilar Jarvis, RVT, RDCS, RDMS  cc:  ------------------------------------------------------------------- LV EF: 55% -   60%  ------------------------------------------------------------------- History:   PMH:  OSA.  Dyspnea that has progressed over the past year.  Mitral valve disease.  Chronic obstructive pulmonary disease.  ------------------------------------------------------------------- Study Conclusions  - Left ventricle: The cavity size was mildly dilated. There was   mild concentric hypertrophy. Systolic function was normal. The   estimated ejection fraction was in the range of 55% to 60%. Wall   motion was normal; there were no regional wall motion   abnormalities. Features are consistent with a pseudonormal left  ventricular filling pattern, with concomitant abnormal relaxation   and increased filling pressure (grade 2 diastolic dysfunction). - Mitral valve: Calcified annulus. Severe prolapse, involving the   posterior leaflet. There was severe regurgitation directed   eccentrically and anteriorly. - Left atrium: The atrium was moderately dilated. - Pulmonary arteries: Systolic pressure was moderately to severely   increased. PA peak pressure: 60 mm Hg (S).  Impressions:  - Recommend cardiac evaluation and consider TEE to evaluate mitral   valve.  ------------------------------------------------------------------- Study data:  The previous study was not available, so comparison was made to the report of May 2017.  Study status:  Routine. Procedure:  Transthoracic echocardiography. Image quality was good.  Study completion:  There were no complications. Transthoracic echocardiography.  M-mode, complete 2D, spectral Doppler, and color Doppler.  Birthdate:  Patient birthdate: 1956/02/18.  Age:  Patient is 61 yr old.  Sex:  Gender: female. BMI: 41.6 kg/m^2.  Blood pressure:     100/60  Patient status: Outpatient.  Study date:  Study date: 04/15/2016. Study time: 09:49 AM.  -------------------------------------------------------------------  ------------------------------------------------------------------- Left ventricle:  The cavity size was mildly dilated. There was mild concentric hypertrophy. Systolic function was normal. The estimated ejection fraction was in the range of 55% to 60%. Wall motion was normal; there were no regional wall motion abnormalities. Features are consistent with a pseudonormal left ventricular filling pattern, with concomitant abnormal relaxation and increased filling pressure (grade 2 diastolic dysfunction).  ------------------------------------------------------------------- Aortic valve:   Trileaflet; normal thickness, mildly calcified leaflets.  Mobility was not restricted.  Doppler:  Transvalvular velocity was within the normal range. There was no stenosis. There was no regurgitation.  ------------------------------------------------------------------- Aorta:  Aortic root: The aortic root was normal in size.  ------------------------------------------------------------------- Mitral valve:   Calcified annulus. Mobility was not restricted. Severe prolapse, involving the posterior leaflet.  Doppler: Transvalvular velocity was within the normal range. There was no evidence for stenosis. There was severe regurgitation directed eccentrically and anteriorly.    Valve area by continuity equation (using LVOT flow): 1.42 cm^2. Indexed valve area by continuity equation (using LVOT flow): 0.68 cm^2/m^2.    Mean gradient (D): 6 mm Hg. Peak gradient (D): 13 mm Hg.  ------------------------------------------------------------------- Left atrium:  The atrium was moderately dilated.  ------------------------------------------------------------------- Right ventricle:  The cavity size was normal. Wall thickness was normal. Systolic function was normal.  ------------------------------------------------------------------- Pulmonic valve:    Doppler:  Transvalvular velocity was within the normal range. There was no evidence for stenosis.  ------------------------------------------------------------------- Tricuspid valve:   Structurally normal valve.    Doppler: Transvalvular velocity was within the normal range. There was mild regurgitation.  ------------------------------------------------------------------- Pulmonary artery:   The main pulmonary artery was normal-sized. Systolic pressure was moderately to severely increased.  ------------------------------------------------------------------- Right atrium:  The atrium was normal in size.  ------------------------------------------------------------------- Pericardium:   There was no pericardial effusion.  ------------------------------------------------------------------- Systemic veins: Inferior vena cava: The vessel was normal in size.  ------------------------------------------------------------------- Measurements   Left ventricle                           Value          Reference  LV ID, ED, PLAX chordal          (H)     52.9  mm       43 - 52  LV ID, ES, PLAX chordal  33.7  mm       23 - 38  LV fx shortening, PLAX chordal           36    %        >=29  LV PW thickness, ED                      12.1  mm       ----------  IVS/LV PW ratio, ED                      1.02           <=1.3  Stroke volume, 2D                        70    ml       ----------  Stroke volume/bsa, 2D                    34    ml/m^2   ----------  LV ejection fraction, 1-p A4C            63    %        ----------  LV e&', lateral                           8.81  cm/s     ----------  LV E/e&', lateral                         20.2           ----------  LV e&', medial                            8.05  cm/s     ----------  LV E/e&', medial                          22.11          ----------  LV e&', average                           8.43  cm/s     ----------  LV E/e&', average                         21.12          ----------    Ventricular septum                       Value          Reference  IVS thickness, ED                        12.3  mm       ----------    LVOT                                     Value          Reference  LVOT ID, S  20    mm       ----------  LVOT area                                3.14  cm^2     ----------  LVOT ID                                  20    mm       ----------  LVOT peak velocity, S                    112   cm/s     ----------  LVOT mean velocity, S                    72.9  cm/s     ----------  LVOT VTI, S                              22.3  cm       ----------  LVOT peak gradient, S                     5     mm Hg    ----------  Stroke volume (SV), LVOT DP              70.1  ml       ----------  Stroke index (SV/bsa), LVOT DP           33.7  ml/m^2   ----------    Aorta                                    Value          Reference  Aortic root ID, ED                       31    mm       ----------  Ascending aorta ID, A-P, S               27    mm       ----------    Left atrium                              Value          Reference  LA ID, A-P, ES                           41    mm       ----------  LA ID/bsa, A-P                           1.97  cm/m^2   <=2.2  LA volume, S                             74.6  ml       ----------  LA volume/bsa, S  35.9  ml/m^2   ----------  LA volume, ES, 1-p A4C                   61.2  ml       ----------  LA volume/bsa, ES, 1-p A4C               29.4  ml/m^2   ----------  LA volume, ES, 1-p A2C                   93.5  ml       ----------  LA volume/bsa, ES, 1-p A2C               45    ml/m^2   ----------    Mitral valve                             Value          Reference  Mitral E-wave peak velocity              178   cm/s     ----------  Mitral A-wave peak velocity              111   cm/s     ----------  Mitral mean velocity, D                  104   cm/s     ----------  Mitral deceleration time                 180   ms       150 - 230  Mitral mean gradient, D                  6     mm Hg    ----------  Mitral peak gradient, D                  13    mm Hg    ----------  Mitral E/A ratio, peak                   3.2            ----------  Mitral valve area, LVOT                  1.42  cm^2     ----------  continuity  Mitral valve area/bsa, LVOT              0.68  cm^2/m^2 ----------  continuity  Mitral annulus VTI, D                    49.3  cm       ----------    Pulmonary arteries                       Value          Reference  PA pressure, S, DP               (H)     60    mm Hg    <=30    Tricuspid valve                           Value          Reference  Tricuspid regurg peak velocity  391   cm/s     ----------  Tricuspid peak RV-RA gradient            61    mm Hg    ----------    Right atrium                             Value          Reference  RA ID, S-I, ES, A4C                      47.6  mm       34 - 49  RA area, ES, A4C                         17    cm^2     8.3 - 19.5  RA volume, ES, A/L                       50.6  ml       ----------  RA volume/bsa, ES, A/L                   24.3  ml/m^2   ----------    Right ventricle                          Value          Reference  TAPSE                                    32.5  mm       ----------  RV s&', lateral, S                        17.1  cm/s     ----------    Pulmonic valve                           Value          Reference  Pulmonic valve peak velocity, S          99.4  cm/s     ----------  Legend: (L)  and  (H)  mark values outside specified reference range.  ------------------------------------------------------------------- Prepared and Electronically Authenticated by  Kathlyn Sacramento, MD 2018-01-16T17:18:16    Transesophageal Echocardiography  Patient:    Lauren Bartlett, Lauren Bartlett MR #:       [id number] Study Date: 06/16/2016 Gender:     F Age:        13 Height: Weight: BSA: Pt. Status: Room:   ADMITTING    Kathlyn Sacramento, MD  ATTENDING    Kathlyn Sacramento, MD  ORDERING     Kathlyn Sacramento, MD  REFERRING    Kathlyn Sacramento, MD  SONOGRAPHER  Sherrie Sport RDCS  PERFORMING   [facility]  cc:  ------------------------------------------------------------------- LV EF: 60% -   65%  ------------------------------------------------------------------- Indications:      Mitral valve disorder 424.0.  ------------------------------------------------------------------- Study Conclusions  - Left ventricle: Systolic function was normal. The estimated   ejection fraction was in the range of 60% to 65%. Wall motion was    normal; there were no regional wall motion abnormalities. - Mitral valve: Severe prolapse,  involving the posterior leaflet.   No evidence of vegetation. There was severe regurgitation   directed anteriorly. - Atrial septum: No defect or patent foramen ovale was identified.   Echo contrast study showed no right-to-left atrial level shunt,   following an increase in RA pressure induced by provocative   maneuvers. - Tricuspid valve: There was mild regurgitation. - Pulmonary arteries: Systolic pressure was moderately increased.   PA peak pressure: 50 mm Hg (S).  ------------------------------------------------------------------- Study data:   Procedure:  Initial setup. The patient was brought to the laboratory. Surface ECG leads were monitored. Sedation. Conscious sedation was administered. Transesophageal echocardiography. Topical anesthesia was obtained using viscous lidocaine. A transesophageal probe was inserted by the attending cardiologist. Image quality was adequate.  Study completion:  The patient tolerated the procedure well. There were no complications.         Diagnostic transesophageal echocardiography.  2D and color Doppler.  Birthdate:  Patient birthdate: 1956-02-27.  Age:  Patient is 61 yr old.  Sex:  Gender: female.  Study date:  Study date: 06/16/2016. Study time: 07:44 AM.  -------------------------------------------------------------------  ------------------------------------------------------------------- Left ventricle:  Systolic function was normal. The estimated ejection fraction was in the range of 60% to 65%. Wall motion was normal; there were no regional wall motion abnormalities.  ------------------------------------------------------------------- Aortic valve:   Structurally normal valve. Trileaflet; normal thickness leaflets. Cusp separation was normal.  No evidence of vegetation.  Doppler:  There was no  regurgitation.  ------------------------------------------------------------------- Aorta:  There was no atheroma. There was no evidence for dissection. Aortic root: The aortic root was not dilated. Ascending aorta: The ascending aorta was normal in size. Aortic arch: The aortic arch was normal in size. Descending aorta: The descending aorta was normal in size.  ------------------------------------------------------------------- Mitral valve:  Leaflet separation was normal.  Severe prolapse, involving the posterior leaflet.  No evidence of vegetation. Doppler:  There was severe regurgitation directed anteriorly.  ------------------------------------------------------------------- Left atrium:  The atrium was normal in size.  No evidence of thrombus in the atrial cavity or appendage. The appendage was morphologically a left appendage, multilobulated, and of normal size. Emptying velocity was normal.  ------------------------------------------------------------------- Atrial septum:  No defect or patent foramen ovale was identified. Echo contrast study showed no right-to-left atrial level shunt, following an increase in RA pressure induced by provocative maneuvers.  ------------------------------------------------------------------- Right ventricle:  The cavity size was normal. Wall thickness was normal. Systolic function was normal.  ------------------------------------------------------------------- Pulmonic valve:    Structurally normal valve.   Cusp separation was normal.  No evidence of vegetation.  ------------------------------------------------------------------- Tricuspid valve:   Structurally normal valve.   Leaflet separation was normal.  Doppler:  There was mild regurgitation.  ------------------------------------------------------------------- Pulmonary artery:   The main pulmonary artery was normal-sized. Systolic pressure was moderately  increased.  ------------------------------------------------------------------- Right atrium:  The atrium was normal in size.  No evidence of thrombus in the atrial cavity or appendage. The appendage was morphologically a right appendage.  ------------------------------------------------------------------- Pericardium:  There was no pericardial effusion.  ------------------------------------------------------------------- Measurements   Pulmonary arteries           Value       Reference  PA pressure, S, DP (H)       50    mm Hg <=30  Legend: (L)  and  (H)  mark values outside specified reference range.  ------------------------------------------------------------------- Prepared and Electronically Authenticated by  Kathlyn Sacramento, MD 2018-03-19T11:59:12    Right/Left Heart Cath and Coronary Angiography  Conclusion  The left ventricular systolic function is normal.  LV end diastolic pressure is normal.  The left ventricular ejection fraction is 55-65% by visual estimate.  There is severe (4+) mitral regurgitation.   1. Normal coronary arteries. Catheter-induced spasm in the ostial right coronary artery with no significant pressure dampening. This improved with catheter pullback. 2. Normal LV systolic function. 3. Severe mitral regurgitation by left ventricular angiography with dilated left atrium. 4. Right heart catheterization showed mildly elevated filling pressures, prominent V wave on pulmonary capillary wedge pressure, moderate pulmonary hypertension and high cardiac output. RA pressure: 6 mmHg, RV pressure: 53/7, PA pressure: 52/15 with a mean of 35 mmHg, pulmonary capillary wedge pressure: 17 mmHg. Cardiac output was 6.9 with a cardiac index of 3.5  Recommendations: Recommend evaluation for mitral valve repair. The patient needs to see pulmonary in order to make sure she is a candidate.   Indications   Mitral valve disease [I05.9 (ICD-10-CM)]   Procedural Details/Technique   Technical Details Procedural Details: The pre-existing IV in the right antecubital vein was exchanged under sterile fashion to a slender sheath. Right heart catheterization was performed using a 5 French Swan-Ganz catheter. Cardiac output was calculated by the Fick method.  The right wrist was prepped, draped, and anesthetized with 1% lidocaine. Using the modified Seldinger technique, a 5 French sheath was introduced into the right radial artery. 2.5 mg of verapamil was administered through the sheath, weight-based unfractionated heparin was administered intravenously.A Jackie catheter was used for selective coronary angiography. A pigtail catheter was used for left ventriculography. Catheter exchanges were performed over an exchange length guidewire. There were no immediate procedural complications. A TR band was used for radial hemostasis at the completion of the procedure. The patient was transferred to the post catheterization recovery area for further monitoring.   Estimated blood loss <50 mL.  During this procedure the patient was administered the following to achieve and maintain moderate conscious sedation: Versed 1 mg, Fentanyl 50 mcg, while the patient's heart rate, blood pressure, and oxygen saturation were continuously monitored. The period of conscious sedation was 32 minutes, of which I was present face-to-face 100% of this time.    Coronary Findings   Dominance: Right  Left Main  Vessel is angiographically normal.  Left Anterior Descending  Vessel is angiographically normal.  First Diagonal Branch  Vessel is angiographically normal.  Second Diagonal Branch  Vessel is small in size. Vessel is angiographically normal.  Third Diagonal Branch  Vessel is small in size. Vessel is angiographically normal.  Left Circumflex  Vessel is angiographically normal.  First Obtuse Marginal Branch  Vessel is small in size. Vessel is angiographically normal.   Second Obtuse Marginal Branch  Vessel is small in size. Vessel is angiographically normal.  Third Obtuse Marginal Branch  Vessel is angiographically normal.  Right Coronary Artery  Vessel is angiographically normal.  Right Posterior Descending Artery  Vessel is angiographically normal.  Right Posterior Atrioventricular Branch  Vessel is angiographically normal.  First Right Posterolateral  Vessel is angiographically normal.  Second Right Posterolateral  Vessel is angiographically normal.  Third Right Posterolateral  Vessel is angiographically normal.  Wall Motion              Left Heart   Left Ventricle The left ventricular size is normal. The left ventricular systolic function is normal. LV end diastolic pressure is normal. The left ventricular ejection fraction is 55-65% by visual estimate. No regional wall motion abnormalities. There is severe (4+) mitral regurgitation.  Coronary Diagrams   Diagnostic Diagram       Implants     No implant documentation for this case.  PACS Images   Show images for Cardiac catheterization   Link to Procedure Log   Procedure Log    Hemo Data   AO Systolic Cath Pressure AO Diastolic Cath Pressure AO Mean Cath Pressure LV Systolic Cath Pressure LV End Diastolic PA Systolic Cath Pressure PA Diastolic Cath Pressure PA Mean Cath Pressure RV Systolic Cath Pressure RV Diastolic Cath Pressure RV End Diastolic AO O2 Sat PA O2 Sat AO O2 Sat  -- -- -- -- -- -- -- -- 53 mmHg 7 mmHg 13 mmHg -- -- --  -- -- -- -- -- 47 mmHg 14 mmHg 33 mmHg -- -- -- -- -- --  146 82 mmHg 111 mmHg -- -- -- -- -- -- -- -- -- -- --  140 78 mmHg 105 mmHg -- -- -- -- -- -- -- -- -- -- --  135 74 mmHg 99 mmHg -- -- -- -- -- -- -- -- -- -- --  138 70 mmHg 82 mmHg -- -- -- -- -- -- -- -- -- -- --  -- -- -- 123 mmHg 24 mmHg -- -- -- -- -- -- -- -- --  -- -- -- 139 mmHg 21 mmHg -- -- -- -- -- -- -- -- --  -- -- -- 137 mmHg 22 mmHg -- -- -- -- -- -- -- -- --  150  85 mmHg 113 mmHg -- -- -- -- -- -- -- -- -- -- --  -- -- -- -- -- -- -- -- -- -- -- 99.1 % -- SA  -- -- --                 Impression:  Patient has stage D severe symptomatic primary mitral regurgitation. She presents with a long history of shortness of breath with frequent intermittent episodes of acute exacerbation of chronic respiratory failure consistent with acute on chronic diastolic congestive heart failure, [place] Heart Association functional class IIIB-IV.  Symptoms are likely multifactorial and related to the patient's numerous other medical problems including morbid obesity, obstructive sleep apnea, and COPD, but I agree that the patient's mitral regurgitation and congestive heart failure may be her biggest problem.  I have personally reviewed the patient's recent transthoracic and transesophageal echocardiograms as well as her diagnostic cardiac catheterization. 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  Left ventricular systolic function remains preserved.  Diagnostic cardiac catheterization is notable for the absence of significant coronary artery disease but moderate pulmonary hypertension. I agree the patient needs elective mitral valve repair.  Risks associated with surgery should be reasonably low but elevated to some degree because of the patient's comorbid medical problems and significant physical deconditioning.   Plan:  The patient, her father, and her close friend were all counseled at length regarding the indications, risks and potential benefits of mitral valve repair.  The rationale for elective surgery has been explained, including a comparison between surgery and continued medical therapy with close follow-up.  The likelihood of successful and durable valve repair has  been discussed with particular reference to the findings of their recent echocardiogram.  Based upon these findings and previous experience, I have quoted them a greater than 95 percent likelihood of successful valve  repair.   The patient understands and accepts all potential risks of surgery including but not limited to risk of death, stroke or other neurologic complication, myocardial infarction, congestive heart failure, respiratory failure, renal failure, bleeding requiring transfusion and/or reexploration, arrhythmia, infection or other wound complications, pneumonia, pleural and/or pericardial effusion, pulmonary embolus, aortic dissection or other major vascular complication, or delayed complications related to valve repair or replacement including but not limited to structural valve deterioration and failure, thrombosis, embolization, endocarditis, or paravalvular leak.  Alternative surgical approaches have been discussed including a comparison between conventional sternotomy and minimally-invasive techniques.  The relative risks and benefits of each have been reviewed as they pertain to the patient's specific circumstances, and all of their questions have been addressed.  Specific risks potentially related to the minimally-invasive approach were discussed at length, including but not limited to risk of conversion to full or partial sternotomy, aortic dissection or other major vascular complication, unilateral acute lung injury or pulmonary edema, phrenic nerve dysfunction or paralysis, rib fracture, chronic pain, lung hernia, or lymphocele.  All of their questions have been answered.  The patient hopes to proceed with surgery in the near future. As a next step she will undergo CT angiography to further characterize the feasibility of peripheral arterial cannulation for surgery. We tentatively plan to proceed with surgery on 07/17/2016. The patient will return to our office for follow-up prior to surgery on  Monday, 07/14/2016.  The patient has been given a prescription for amiodarone to begin 1 week prior to surgery to decrease her risk of perioperative atrial dysrhythmias.  I spent in excess of 90 minutes during the conduct of this office consultation and >50% of this time involved direct face-to-face encounter with the patient for counseling and/or coordination of their care.    Valentina Gu. Roxy Manns, MD 06/24/2016 11:45 AM

## 2016-06-24 NOTE — Patient Instructions (Addendum)
Continue all previous medications without any changes at this time  Begin taking amiodarone 7 days prior to surgery (April 12).  Stop taking Claritin while you are taking amiodarone  Contact Dr Fletcher Anon or Dr Derrel Nip if you have increased problems with your breathing between now and the time of surgery.  You may need increased diuretic medication (hydrodiuril or furosemide) with or without adjustment in your blood pressure medications

## 2016-06-25 ENCOUNTER — Other Ambulatory Visit: Payer: Self-pay | Admitting: *Deleted

## 2016-06-25 DIAGNOSIS — J441 Chronic obstructive pulmonary disease with (acute) exacerbation: Secondary | ICD-10-CM | POA: Diagnosis not present

## 2016-06-25 DIAGNOSIS — I340 Nonrheumatic mitral (valve) insufficiency: Secondary | ICD-10-CM

## 2016-06-25 DIAGNOSIS — R531 Weakness: Secondary | ICD-10-CM | POA: Diagnosis not present

## 2016-06-26 ENCOUNTER — Telehealth: Payer: Self-pay | Admitting: Internal Medicine

## 2016-06-26 ENCOUNTER — Encounter: Payer: Self-pay | Admitting: Cardiovascular Disease

## 2016-06-26 ENCOUNTER — Other Ambulatory Visit
Admission: RE | Admit: 2016-06-26 | Discharge: 2016-06-26 | Disposition: A | Discharge status: Home / Self Care | Payer: Medicare Other | Source: Ambulatory Visit | Attending: Pulmonary Disease | Admitting: Pulmonary Disease

## 2016-06-26 ENCOUNTER — Ambulatory Visit (INDEPENDENT_AMBULATORY_CARE_PROVIDER_SITE_OTHER): Payer: Medicare Other | Admitting: Cardiovascular Disease

## 2016-06-26 ENCOUNTER — Ambulatory Visit
Admission: EM | Admit: 2016-06-26 | Discharge: 2016-06-26 | Disposition: A | Discharge status: Home / Self Care | Payer: Medicare Other | Attending: Family Medicine | Admitting: Family Medicine

## 2016-06-26 VITALS — BP 104/60 | HR 93 | Ht 62.0 in | Wt 217.2 lb

## 2016-06-26 DIAGNOSIS — J449 Chronic obstructive pulmonary disease, unspecified: Secondary | ICD-10-CM | POA: Insufficient documentation

## 2016-06-26 DIAGNOSIS — I340 Nonrheumatic mitral (valve) insufficiency: Secondary | ICD-10-CM

## 2016-06-26 DIAGNOSIS — I2722 Pulmonary hypertension due to left heart disease: Secondary | ICD-10-CM

## 2016-06-26 DIAGNOSIS — R062 Wheezing: Secondary | ICD-10-CM

## 2016-06-26 DIAGNOSIS — I38 Endocarditis, valve unspecified: Secondary | ICD-10-CM

## 2016-06-26 DIAGNOSIS — J441 Chronic obstructive pulmonary disease with (acute) exacerbation: Secondary | ICD-10-CM | POA: Diagnosis not present

## 2016-06-26 DIAGNOSIS — R0602 Shortness of breath: Secondary | ICD-10-CM

## 2016-06-26 LAB — EXPECTORATED SPUTUM ASSESSMENT W REFEX TO RESP CULTURE

## 2016-06-26 LAB — EXPECTORATED SPUTUM ASSESSMENT W GRAM STAIN, RFLX TO RESP C

## 2016-06-26 MED ORDER — METHYLPREDNISOLONE SODIUM SUCC 125 MG IJ SOLR
125.0000 mg | Freq: Once | INTRAMUSCULAR | Status: AC
  Administered 2016-06-26: 125 mg via INTRAMUSCULAR

## 2016-06-26 MED ORDER — IPRATROPIUM-ALBUTEROL 0.5-2.5 (3) MG/3ML IN SOLN
3.0000 mL | Freq: Once | RESPIRATORY_TRACT | Status: AC
  Administered 2016-06-26: 3 mL via RESPIRATORY_TRACT

## 2016-06-26 NOTE — Patient Instructions (Signed)
Medication Instructions: No change.   Labwork: None.   Procedures/Testing: None.   Follow-Up: 2 months with [name].   Any Additional Special Instructions Will Be Listed Below (If Applicable).     If you need a refill on your cardiac medications before your next appointment, please call your pharmacy.

## 2016-06-26 NOTE — ED Triage Notes (Signed)
Pt c/o wheezing that started on Tuesday, and she saw her pulmonary doctor and he didn't feel like she needed steroids. She saw her cardiologist today and they said she was wheezing and should come here. Expiratory wheezing bilaterally

## 2016-06-26 NOTE — Progress Notes (Signed)
Cardiology Office Note   Date:  06/26/2016   ID:  Lauren Bartlett, DOB 1955-06-30, MRN [id number]  PCP:  Lauren Mc, MD  Cardiologist:  New  Chief Complaint  Patient presents with  . other    3 week follow up s/p TEE & cath. Pt. scheduled for a valve repair on July 17, 2016.Meds reviewed by the pt. verbally. "doing well."       History of Present Illness: Lauren Bartlett is a 61 y.o. female who is here today for a follow-up visit regarding mitral regurgitation. The patient has chronic medical conditions that include COPD, mitral valve prolapse, essential hypertension, anxiety and depression. She is a lifelong nonsmoker but reports being exposed to smoking. She is known to have moderate COPD followed by Dr. Alva Garnet.  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 She has history of sleep apnea with intolerance to CPAP. It was felt that her shortness of breath was out of proportion to pulmonary findings. She underwent TEE recently which confirmed severe prolapse of the posterior leaflet with severe eccentric regurgitation. EF was 60-65% with moderate pulmonary hypertension. She underwent a right and left cardiac catheterization which showed normal coronary arteries and normal LV systolic function. Right heart catheterization showed mildly elevated filling pressures, prominent V wave, moderate pulmonary hypertension and high cardiac output.  She saw [name] with plans for mitral valve repair next month. She was sent back to pulmonary for optimization of her lung status. She was placed on antibiotics. She is feeling reasonably well although she continues to wheeze. No chest pain.    Past Medical History:  Diagnosis Date  . Anxiety   . Chronic diastolic congestive heart failure ([place])   . Congenital absence of one kidney   . COPD (chronic obstructive pulmonary disease) ([place])   . Depression    treated at [facility]  . Emphysema/COPD ([place])   . History of sciatica   .  Hypertension   . hypothyrodism   . Hypothyroidism   . Motion sickness    cars  . MVP (mitral valve prolapse)   . PONV (postoperative nausea and vomiting)   . Pulmonary hypertension   . Severe mitral regurgitation   . Sleep apnea    O2 at night and PRN    Past Surgical History:  Procedure Laterality Date  . ABDOMINAL HYSTERECTOMY    . BREAST BIOPSY Right 2011   [facility]< benign  . CATARACT EXTRACTION W/PHACO Left 11/14/2015   Procedure: CATARACT EXTRACTION PHACO AND INTRAOCULAR LENS PLACEMENT (IOC);  Surgeon: Leandrew Koyanagi, MD;  Location: [facility];  Service: Ophthalmology;  Laterality: Left;  sleep apnea [place]  . CATARACT EXTRACTION W/PHACO Right 12/19/2015   Procedure: CATARACT EXTRACTION PHACO AND INTRAOCULAR LENS PLACEMENT (IOC);  Surgeon: Leandrew Koyanagi, MD;  Location: [facility];  Service: Ophthalmology;  Laterality: Right;  ANXIETY GENEROUS IV SEDATION TORIC LEN  . COMBINED HYSTERECTOMY ABDOMINAL W/ A&P REPAIR / OOPHORECTOMY  1996   benign tumor  . INNER EAR SURGERY     bilateral  . RIGHT/LEFT HEART CATH AND CORONARY ANGIOGRAPHY Bilateral 06/16/2016   Procedure: Right/Left Heart Cath and Coronary Angiography;  Surgeon: Wellington Hampshire, MD;  Location: [facility];  Service: Cardiovascular;  Laterality: Bilateral;  . TEE WITHOUT CARDIOVERSION N/A 05/19/2016   Procedure: TRANSESOPHAGEAL ECHOCARDIOGRAM (TEE);  Surgeon: Wellington Hampshire, MD;  Location: [facility];  Service: Cardiovascular;  Laterality: N/A;  . TEE WITHOUT CARDIOVERSION N/A 06/09/2016   Procedure: TRANSESOPHAGEAL  ECHOCARDIOGRAM (TEE);  Surgeon: Wellington Hampshire, MD;  Location: [facility];  Service: Cardiovascular;  Laterality: N/A;  . TEE WITHOUT CARDIOVERSION N/A 06/16/2016   Procedure: TRANSESOPHAGEAL ECHOCARDIOGRAM (TEE);  Surgeon: Wellington Hampshire, MD;  Location: [facility];  Service: Cardiovascular;  Laterality: N/A;     Current Outpatient Prescriptions  Medication Sig Dispense  Refill  . acetaminophen (TYLENOL) 500 MG tablet Take 500 mg by mouth 2 (two) times daily as needed for moderate pain or headache.    . albuterol (PROVENTIL HFA;VENTOLIN HFA) 108 (90 Base) MCG/ACT inhaler Inhale 2 puffs into the lungs every 6 (six) hours as needed for wheezing or shortness of breath. Pt uses with SPACER.    [name] albuterol (PROVENTIL) (2.5 MG/3ML) 0.083% nebulizer solution Take 2.5 mg by nebulization every 6 (six) hours as needed for wheezing or shortness of breath.    . doxycycline (VIBRA-TABS) 100 MG tablet Take 1 tablet (100 mg total) by mouth 2 (two) times daily. 14 tablet 0  . famotidine (PEPCID AC) 10 MG chewable tablet Chew 10 mg by mouth daily as needed for heartburn.    . fluticasone (FLONASE) 50 MCG/ACT nasal spray Place 2 sprays into both nostrils daily.    . fluticasone (FLOVENT HFA) 110 MCG/ACT inhaler Inhale 1 puff into the lungs 2 (two) times daily.    . hydrochlorothiazide (HYDRODIURIL) 25 MG tablet TAKE 1/2 TABLET(12.5 MG) BY MOUTH DAILY 45 tablet 1  . hydroxypropyl methylcellulose / hypromellose (ISOPTO TEARS / GONIOVISC) 2.5 % ophthalmic solution Place 1 drop into both eyes 2 (two) times daily.    [name] ipratropium (ATROVENT) 0.03 % nasal spray USE 2 SPRAYS NASALLY THREE TIMES DAILY 90 mL 1  . levothyroxine (SYNTHROID, LEVOTHROID) 112 MCG tablet Take 112 mcg by mouth daily before breakfast.    . loratadine-pseudoephedrine (CLARITIN-D 24-HOUR) 10-240 MG 24 hr tablet Take 1 tablet by mouth daily.    [name] LORazepam (ATIVAN) 0.5 MG tablet Take 1 tablet (0.5 mg total) by mouth every 8 (eight) hours as needed for anxiety. (Patient taking differently: Take 0.5 mg by mouth daily as needed for anxiety. ) 30 tablet 0  . losartan (COZAAR) 100 MG tablet TAKE 1 TABLET(100 MG) BY MOUTH DAILY 30 tablet 5  . montelukast (SINGULAIR) 10 MG tablet Take 1 tablet (10 mg total) by mouth at bedtime. 30 tablet 3  . OXYGEN Inhale 2 L into the lungs at bedtime as needed (at bedtime and as needed).      . polyethylene glycol powder (GLYCOLAX/MIRALAX) powder Take 17 g by mouth 2 (two) times daily as needed. (Patient taking differently: Take 17 g by mouth 2 (two) times daily as needed for mild constipation. ) 3350 g 1  . traMADol (ULTRAM) 50 MG tablet 1-2 tabs po q 6 hr prn pain Maximum dose= 8 tablets per day (Patient taking differently: Take 50-100 mg by mouth 3 (three) times daily as needed for severe pain. ) 20 tablet 0  . umeclidinium-vilanterol (ANORO ELLIPTA) 62.5-25 MCG/INH AEPB Inhale 1 puff into the lungs daily. 1 each 11  . amiodarone (PACERONE) 200 MG tablet TAKE 1 TABLET BY MOUTH DAILY (Patient not taking: Reported on 06/26/2016) 90 tablet 0   No current facility-administered medications for this visit.     Allergies:   Codeine; Levaquin [levofloxacin]; Penicillins; Zithromax [azithromycin]; and Prednisone    Social History:  The patient  reports that she has never smoked. She has never used smokeless tobacco. She reports that she does not drink alcohol or use  drugs.   Family History:  The patient's family history includes Breast cancer in her paternal aunt; Coronary artery disease in her father; Heart disease in her brother; Multiple sclerosis in her mother.    ROS:  Please see the history of present illness.   Otherwise, review of systems are positive for none.   All other systems are reviewed and negative.    PHYSICAL EXAM: VS:  BP 104/60 (BP Location: Right Arm, Patient Position: Sitting, Cuff Size: Normal)   Pulse 93   Ht 5\' 2"  (1.575 m)   Wt 217 lb 4 oz (98.5 kg)   BMI 39.74 kg/m  , BMI Body mass index is 39.74 kg/m. GEN: Well nourished, well developed, in no acute distress  HEENT: normal  Neck: no JVD, carotid bruits, or masses Cardiac: RRR; no rubs, or gallops,no edema . 3/6 holosystolic murmur at the apex radiating to the left sternal border and the base of the heart. Respiratory:  Mild expiratory wheezing, normal work of breathing GI: soft, nontender,  nondistended, + BS MS: no deformity or atrophy  Skin: warm and dry, no rash Neuro:  Strength and sensation are intact Psych: euthymic mood, full affect Right radial pulse is normal with no hematoma.   EKG:  EKG is ordered today. EKG showed normal sinus rhythm with no significant ST or T wave changes.  Recent Labs: 09/04/2015: B Natriuretic Peptide 82.0 01/04/2016: ALT 13 05/22/2016: Magnesium 1.9 06/05/2016: BUN 19; Creatinine, Ser 0.70; Hemoglobin 11.5; Platelets 180; Potassium 3.7; Sodium 140    Lipid Panel    Component Value Date/Time   CHOL 173 03/14/2015 1048   TRIG 85.0 03/14/2015 1048   HDL 57.10 03/14/2015 1048   CHOLHDL 3 03/14/2015 1048   VLDL 17.0 03/14/2015 1048   LDLCALC 99 03/14/2015 1048      Wt Readings from Last 3 Encounters:  06/26/16 217 lb 4 oz (98.5 kg)  06/24/16 218 lb (98.9 kg)  06/23/16 216 lb (98 kg)      PAD Screen 05/13/2016  Previous PAD dx? No  Previous surgical procedure? No  Pain with walking? No  Feet/toe relief with dangling? No  Painful, non-healing ulcers? No  Extremities discolored? No      ASSESSMENT AND PLAN:  1.  Severe mitral regurgitation due to mitral valve prolapse: She appears to be symptomatic. I suspect that her dyspnea is multifactorial due to mitral regurgitation, COPD and physical deconditioning. The patient is scheduled for mitral valve surgery next month. Continue same treatment for now.  If dyspnea worsened, we can consider adding small dose furosemide given that her LVEDP was mildly elevated. I suspect that current congestion and wheezing is likely related to her pulmonary disease.  I discussed details of mitral valve surgery and answered all her questions.  2. Pulmonary hypertension likely due to mitral regurgitation.  This was moderate.  3. COPD: She continues to have mild expiratory wheezing. She reports hallucinations with oral prednisone but not with injections. She might require IV steroids before surgery.  She is supposed to follow-up with pulmonary.   Disposition:   FU with me in 2 months  Signed,  Kathlyn Sacramento, MD  06/26/2016 2:03 PM    [place]

## 2016-06-26 NOTE — Telephone Encounter (Signed)
Patient is getting ready to have surgery and thinks that Dr. Juanell Fairly wanted her to have another test to check her lungs .  Please call to discuss.

## 2016-06-26 NOTE — Telephone Encounter (Signed)
New order placed. nothing further needed.

## 2016-06-26 NOTE — Telephone Encounter (Signed)
Spoke with pt and informed her she to call back in a week to let us know how she is doing so DR can decide if she can be cleared for surgery.

## 2016-06-26 NOTE — ED Provider Notes (Signed)
[facility]    CSN: [id number] Arrival date & time: 06/26/16  1500     History   Chief Complaint Chief Complaint  Patient presents with  . Wheezing    HPI Lauren Bartlett is a 61 y.o. female.   Patient is a 61 year-old white female who presents with shortness of breath and wheezing and cough. She states for several days now she's had increased wheezing and shortness of breath. She has a history of COPD and emphysema secondary smoke growing up as a child. She reports she saw a pulmonologist Tuesday at that time she thought that he was on place her on a regular dose of prednisone but steady placed on doxycycline. She saw cardiologist today who reported to her that she's wheezing and she felt that she needs some type of steroid treatment at that this to be the closest his way to get a steroid injection. She states that she is to have open heart surgery for a leaking valve some time mid April and they want her to be wheeze free with best done as well. She cannot take oral steroids and has have injectable steroids. She still coughing as well.   The history is provided by the patient and a friend. No language interpreter was used.  Wheezing  Severity:  Moderate Severity compared to prior episodes:  Similar Timing:  Constant Progression:  Worsening Chronicity:  New Context: exposure to allergen and pollens   Relieved by:  Nothing Worsened by:  Activity Ineffective treatments:  Beta-agonist inhaler (doxycycline) Associated symptoms: chest tightness   Associated symptoms: no fever, no rash and no rhinorrhea     Past Medical History:  Diagnosis Date  . Anxiety   . Chronic diastolic congestive heart failure ([place])   . Congenital absence of one kidney   . COPD (chronic obstructive pulmonary disease) ([place])   . Depression    treated at [facility]  . Emphysema/COPD ([place])   . History of sciatica   . Hypertension   . hypothyrodism   . Hypothyroidism   . Motion sickness      cars  . MVP (mitral valve prolapse)   . PONV (postoperative nausea and vomiting)   . Pulmonary hypertension   . Severe mitral regurgitation   . Sleep apnea    O2 at night and PRN    Patient Active Problem List   Diagnosis Date Noted  . Chronic diastolic congestive heart failure ([place])   . Hypoxia   . Influenza B 05/20/2016  . COPD exacerbation ([place]) 05/19/2016  . COPD (chronic obstructive pulmonary disease) ([place]) 05/19/2016  . 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  . Constipation 08/05/2015  . Hospital discharge follow-up 08/05/2015  . Chronic respiratory failure with hypoxia ([place])   . Encounter for preventive health examination 03/14/2015  . Breast cancer screening 03/14/2015  . Need for hepatitis C screening test 03/14/2015  . Vitamin D deficiency 01/11/2015  . S/P hysterectomy with oophorectomy 09/22/2013  . Pulmonary hypertension ([place])   . Hypothyroidism 05/04/2012  . Severe mitral regurgitation   . Obesity (BMI 30-39.9) 08/04/2011  . Depression   . Hypertension   . Congenital absence of one kidney   . History of sciatica     Past Surgical History:  Procedure Laterality Date  . ABDOMINAL HYSTERECTOMY    . BREAST BIOPSY Right 2011   [facility]< benign  . CATARACT EXTRACTION W/PHACO Left 11/14/2015   Procedure: CATARACT EXTRACTION PHACO AND INTRAOCULAR LENS PLACEMENT (IOC);  Surgeon: Leandrew Koyanagi, MD;  Location:  [facility];  Service: Ophthalmology;  Laterality: Left;  sleep apnea [place]  . CATARACT EXTRACTION W/PHACO Right 12/19/2015   Procedure: CATARACT EXTRACTION PHACO AND INTRAOCULAR LENS PLACEMENT (IOC);  Surgeon: Leandrew Koyanagi, MD;  Location: [facility];  Service: Ophthalmology;  Laterality: Right;  ANXIETY GENEROUS IV SEDATION TORIC LEN  . COMBINED HYSTERECTOMY ABDOMINAL W/ A&P REPAIR / OOPHORECTOMY  1996   benign tumor  . INNER EAR SURGERY     bilateral  . RIGHT/LEFT HEART CATH AND CORONARY ANGIOGRAPHY Bilateral 06/16/2016   Procedure: Right/Left  Heart Cath and Coronary Angiography;  Surgeon: Wellington Hampshire, MD;  Location: [facility];  Service: Cardiovascular;  Laterality: Bilateral;  . TEE WITHOUT CARDIOVERSION N/A 05/19/2016   Procedure: TRANSESOPHAGEAL ECHOCARDIOGRAM (TEE);  Surgeon: Wellington Hampshire, MD;  Location: [facility];  Service: Cardiovascular;  Laterality: N/A;  . TEE WITHOUT CARDIOVERSION N/A 06/09/2016   Procedure: TRANSESOPHAGEAL ECHOCARDIOGRAM (TEE);  Surgeon: Wellington Hampshire, MD;  Location: [facility];  Service: Cardiovascular;  Laterality: N/A;  . TEE WITHOUT CARDIOVERSION N/A 06/16/2016   Procedure: TRANSESOPHAGEAL ECHOCARDIOGRAM (TEE);  Surgeon: Wellington Hampshire, MD;  Location: [facility];  Service: Cardiovascular;  Laterality: N/A;    OB History    No data available       Home Medications    Prior to Admission medications   Medication Sig Start Date End Date Taking? Authorizing Provider  acetaminophen (TYLENOL) 500 MG tablet Take 500 mg by mouth 2 (two) times daily as needed for moderate pain or headache.    Historical Provider, MD  albuterol (PROVENTIL HFA;VENTOLIN HFA) 108 (90 Base) MCG/ACT inhaler Inhale 2 puffs into the lungs every 6 (six) hours as needed for wheezing or shortness of breath. Pt uses with SPACER.    Historical Provider, MD  albuterol (PROVENTIL) (2.5 MG/3ML) 0.083% nebulizer solution Take 2.5 mg by nebulization every 6 (six) hours as needed for wheezing or shortness of breath.    Historical Provider, MD  amiodarone (PACERONE) 200 MG tablet TAKE 1 TABLET BY MOUTH DAILY Patient not taking: Reported on 06/26/2016 06/24/16   Rexene Alberts, MD  doxycycline (VIBRA-TABS) 100 MG tablet Take 1 tablet (100 mg total) by mouth 2 (two) times daily. 06/23/16   Laverle Hobby, MD  famotidine (PEPCID AC) 10 MG chewable tablet Chew 10 mg by mouth daily as needed for heartburn.    Historical Provider, MD  fluticasone (FLONASE) 50 MCG/ACT nasal spray Place 2 sprays into both nostrils daily.     Historical Provider, MD  fluticasone (FLOVENT HFA) 110 MCG/ACT inhaler Inhale 1 puff into the lungs 2 (two) times daily.    Historical Provider, MD  hydrochlorothiazide (HYDRODIURIL) 25 MG tablet TAKE 1/2 TABLET(12.5 MG) BY MOUTH DAILY 10/22/15   [name], MD  hydroxypropyl methylcellulose / hypromellose (ISOPTO TEARS / GONIOVISC) 2.5 % ophthalmic solution Place 1 drop into both eyes 2 (two) times daily.    Historical Provider, MD  ipratropium (ATROVENT) 0.03 % nasal spray USE 2 SPRAYS NASALLY THREE TIMES DAILY 06/23/16   Laverle Hobby, MD  levothyroxine (SYNTHROID, LEVOTHROID) 112 MCG tablet Take 112 mcg by mouth daily before breakfast.    Historical Provider, MD  loratadine-pseudoephedrine (CLARITIN-D 24-HOUR) 10-240 MG 24 hr tablet Take 1 tablet by mouth daily.    Historical Provider, MD  LORazepam (ATIVAN) 0.5 MG tablet Take 1 tablet (0.5 mg total) by mouth every 8 (eight) hours as needed for anxiety. Patient taking differently: Take 0.5 mg by mouth daily as needed  for anxiety.  04/18/16 04/18/17  Earleen Newport, MD  losartan (COZAAR) 100 MG tablet TAKE 1 TABLET(100 MG) BY MOUTH DAILY 05/26/16   [name], MD  montelukast (SINGULAIR) 10 MG tablet Take 1 tablet (10 mg total) by mouth at bedtime. 06/05/15   Fritzi Mandes, MD  OXYGEN Inhale 2 L into the lungs at bedtime as needed (at bedtime and as needed).    Historical Provider, MD  polyethylene glycol powder (GLYCOLAX/MIRALAX) powder Take 17 g by mouth 2 (two) times daily as needed. Patient taking differently: Take 17 g by mouth 2 (two) times daily as needed for mild constipation.  08/03/15   [name], MD  traMADol (ULTRAM) 50 MG tablet 1-2 tabs po q 6 hr prn pain Maximum dose= 8 tablets per day Patient taking differently: Take 50-100 mg by mouth 3 (three) times daily as needed for severe pain.  11/09/15   Melynda Ripple, MD  umeclidinium-vilanterol [facility] ELLIPTA) 62.5-25 MCG/INH AEPB Inhale 1 puff into the lungs daily. 04/28/16    [name], MD    Family History Family History  Problem Relation Age of Onset  . Multiple sclerosis Mother   . Coronary artery disease Father   . Heart disease Brother   . Breast cancer Paternal Aunt     Social History Social History  Substance Use Topics  . Smoking status: Never Smoker  . Smokeless tobacco: Never Used  . Alcohol use No     Allergies   Codeine; Levaquin [levofloxacin]; Penicillins; Zithromax [azithromycin]; and Prednisone   Review of Systems Review of Systems  Constitutional: Negative for fever.  HENT: Negative for rhinorrhea.   Respiratory: Positive for chest tightness and wheezing.   Skin: Negative for rash.  All other systems reviewed and are negative.    Physical Exam Triage Vital Signs ED Triage Vitals [06/26/16 1533]  Enc Vitals Group     BP 129/62     Pulse Rate 85     Resp 18     Temp 97.5 F (36.4 C)     Temp Source Oral     SpO2 100 %     Weight      Height      Head Circumference      Peak Flow      Pain Score      Pain Loc      Pain Edu?      Excl. in [place]?    No data found.   Updated Vital Signs BP 129/62 (BP Location: Left Arm)   Pulse 85   Temp 97.5 F (36.4 C) (Oral)   Resp 18   SpO2 100%   Visual Acuity Right Eye Distance:   Left Eye Distance:   Bilateral Distance:    Right Eye Near:   Left Eye Near:    Bilateral Near:     Physical Exam  Constitutional: She appears well-developed and well-nourished.  Non-toxic appearance. She has a sickly appearance. She appears ill. No distress.  Elderly white female who does appear older than stated age  HENT:  Head: Normocephalic and atraumatic.  Right Ear: External ear normal.  Left Ear: External ear normal.  Nose: Nose normal.  Mouth/Throat: Oropharynx is clear and moist.  Eyes: Pupils are equal, round, and reactive to light.  Neck: Normal range of motion. Neck supple.  Cardiovascular: Normal rate and regular rhythm.   Pulmonary/Chest: She has wheezes.    Musculoskeletal: Normal range of motion.  Neurological: She is alert.  Skin: Skin is warm.  Psychiatric: She has a normal mood and affect.     UC Treatments / Results  Labs (all labs ordered are listed, but only abnormal results are displayed) Labs Reviewed - No data to display  EKG  EKG Interpretation None       Radiology No results found.  Procedures Procedures (including critical care time)  Medications Ordered in UC Medications  ipratropium-albuterol (DUONEB) 0.5-2.5 (3) MG/3ML nebulizer solution 3 mL (not administered)  methylPREDNISolone sodium succinate (SOLU-MEDROL) 125 mg/2 mL injection 125 mg (not administered)     Initial Impression / Assessment and Plan / UC Course  I have reviewed the triage vital signs and the nursing notes.  Pertinent labs & imaging results that were available during my care of the patient were reviewed by me and considered in my medical decision making (see chart for details).     Patient: Fransisco Beau spasms will be given DuoNeb treatment will have her continue the doxycycline. And will add 125 mg Solu-Medrol IM since she is unable to tolerate oral medications recommend strongly she follow-up PCP on Monday and Tuesday not better or with pulmonologist  Final Clinical Impressions(s) / UC Diagnoses   Final diagnoses:  Wheezing  COPD with acute exacerbation [facility])    New Prescriptions New Prescriptions   No medications on file     Note: This dictation was prepared with Dragon dictation along with smaller phrase technology. Any transcriptional errors that result from this process are unintentional.   Frederich Cha, MD 06/26/16 1616

## 2016-06-26 NOTE — Telephone Encounter (Signed)
LMOVM for pt to return call 

## 2016-06-26 NOTE — Telephone Encounter (Signed)
Patient in office to see Fletcher Anon today and has a sputum in specimen cup.  Patient label printed and given to volunteer to take to lab.  There is no order in epic please call the lab with instructions and or orders or enter in epic [phone].

## 2016-06-29 LAB — CULTURE, RESPIRATORY W GRAM STAIN: Culture: NORMAL

## 2016-06-29 LAB — CULTURE, RESPIRATORY

## 2016-06-30 ENCOUNTER — Telehealth: Payer: Self-pay | Admitting: Internal Medicine

## 2016-06-30 MED ORDER — MONTELUKAST SODIUM 10 MG PO TABS: 10.0000 mg | ORAL_TABLET | Freq: Every day | ORAL | 3 refills | Status: DC

## 2016-06-30 NOTE — Telephone Encounter (Signed)
yes

## 2016-06-30 NOTE — Telephone Encounter (Signed)
Patient having Mitral Valve repair on 07/17/16 and would like to see PCP prior to surgery to discuss safety of operation. Scheduled 07/04/16 at 11:30 ?

## 2016-06-30 NOTE — Telephone Encounter (Signed)
Pt called and stated that she is scheduled for open heart surgery on 4/19. Pt would like to make an appt to come in and see someone to make sure that she lungs are ok because she had an COPD exacerbation last Thursday and they gave her a steroid shot. Please advise, thank you!  Call pt @ [phone]

## 2016-06-30 NOTE — Telephone Encounter (Signed)
refill sent, thanks!

## 2016-06-30 NOTE — Telephone Encounter (Signed)
[facility] called and is looking for a new rx for pt's montelukast (SINGULAIR) 10 MG tablet. Please advise, thank you!  Pharmacy phone [phone] Pharmacy Fax [phone]

## 2016-07-04 ENCOUNTER — Ambulatory Visit (INDEPENDENT_AMBULATORY_CARE_PROVIDER_SITE_OTHER): Payer: Medicare Other | Admitting: Internal Medicine

## 2016-07-04 ENCOUNTER — Encounter: Payer: Self-pay | Admitting: Internal Medicine

## 2016-07-04 ENCOUNTER — Telehealth: Payer: Self-pay | Admitting: *Deleted

## 2016-07-04 DIAGNOSIS — R05 Cough: Secondary | ICD-10-CM

## 2016-07-04 DIAGNOSIS — I340 Nonrheumatic mitral (valve) insufficiency: Secondary | ICD-10-CM

## 2016-07-04 DIAGNOSIS — J441 Chronic obstructive pulmonary disease with (acute) exacerbation: Secondary | ICD-10-CM | POA: Diagnosis not present

## 2016-07-04 DIAGNOSIS — R059 Cough, unspecified: Secondary | ICD-10-CM

## 2016-07-04 MED ORDER — DOXYCYCLINE HYCLATE 100 MG PO TABS: 100.0000 mg | ORAL_TABLET | Freq: Two times a day (BID) | ORAL | 0 refills | Status: DC

## 2016-07-04 MED ORDER — METHYLPREDNISOLONE ACETATE 40 MG/ML IJ SUSP: 80.0000 mg | Freq: Once | INTRAMUSCULAR | Status: DC

## 2016-07-04 MED ORDER — METHYLPREDNISOLONE ACETATE 80 MG/ML IJ SUSP
80.0000 mg | Freq: Once | INTRAMUSCULAR | Status: AC
  Administered 2016-07-04: 80 mg via INTRAMUSCULAR

## 2016-07-04 MED ORDER — BENZONATATE 200 MG PO CAPS
200.0000 mg | ORAL_CAPSULE | Freq: Three times a day (TID) | ORAL | 1 refills | Status: DC | PRN
Start: 2016-07-04 — End: 2017-01-01

## 2016-07-04 NOTE — Progress Notes (Signed)
Pre visit review using our clinic review tool, if applicable. No additional management support is needed unless otherwise documented below in the visit note. 

## 2016-07-04 NOTE — Patient Instructions (Addendum)
  You received shot of solumedrol for your COPD exacerbation  Continue using your albuterol, claritin,  Delsym, flovent, and singulair    I am prescribing doxycycline twice daily for 7 days.  Take with food  Add the Cough capsules  Every 8 hours if needed (if delsym doesn't work)  Return on Monday for quick check to see if you need another solumedrol shot

## 2016-07-04 NOTE — Telephone Encounter (Signed)
Ms. Riggio is scheduled for cardiac surgery on 07/17/16 and [name] has prescribed Amiodarone preop. She has called with concerns after reading the accompanying literature and doesn't feel safe taking it since she lives alone. I will contact [name] of this and advise her of his advice. She agrees.

## 2016-07-04 NOTE — Progress Notes (Signed)
Subjective:  Patient ID: Lauren Bartlett, female    DOB: Feb 07, 1956  Age: 61 y.o. MRN: [id number]  CC: Diagnoses of Cough, COPD exacerbation ([place]), and Severe mitral regurgitation were pertinent to this visit.  HPI Lauren Bartlett presents for  Evaluation of congestion and cough .  Her symptoms started on Monday with rhinitis and sneezing followed by cough which has become productive .  She denies shortness of breath but doing some wheezing.  Taking albuterol MDI every 4 hours   Mitral valve repair surgery is scheduled for April 19th   Outpatient Medications Prior to Visit  Medication Sig Dispense Refill  . acetaminophen (TYLENOL) 500 MG tablet Take 500 mg by mouth 2 (two) times daily as needed for moderate pain or headache.    . albuterol (PROVENTIL HFA;VENTOLIN HFA) 108 (90 Base) MCG/ACT inhaler Inhale 2 puffs into the lungs every 6 (six) hours as needed for wheezing or shortness of breath. Pt uses with SPACER.    [name] albuterol (PROVENTIL) (2.5 MG/3ML) 0.083% nebulizer solution Take 2.5 mg by nebulization every 6 (six) hours as needed for wheezing or shortness of breath.    [name] amiodarone (PACERONE) 200 MG tablet TAKE 1 TABLET BY MOUTH DAILY (Patient not taking: Reported on 06/26/2016) 90 tablet 0  . doxycycline (VIBRA-TABS) 100 MG tablet Take 1 tablet (100 mg total) by mouth 2 (two) times daily. 14 tablet 0  . famotidine (PEPCID AC) 10 MG chewable tablet Chew 10 mg by mouth daily as needed for heartburn.    . fluticasone (FLONASE) 50 MCG/ACT nasal spray Place 2 sprays into both nostrils daily.    . fluticasone (FLOVENT HFA) 110 MCG/ACT inhaler Inhale 1 puff into the lungs 2 (two) times daily.    . hydrochlorothiazide (HYDRODIURIL) 25 MG tablet TAKE 1/2 TABLET(12.5 MG) BY MOUTH DAILY 45 tablet 1  . hydroxypropyl methylcellulose / hypromellose (ISOPTO TEARS / GONIOVISC) 2.5 % ophthalmic solution Place 1 drop into both eyes 2 (two) times daily.    [name] ipratropium (ATROVENT) 0.03 % nasal spray USE 2  SPRAYS NASALLY THREE TIMES DAILY 90 mL 1  . levothyroxine (SYNTHROID, LEVOTHROID) 112 MCG tablet Take 112 mcg by mouth daily before breakfast.    . loratadine-pseudoephedrine (CLARITIN-D 24-HOUR) 10-240 MG 24 hr tablet Take 1 tablet by mouth daily.    [name] LORazepam (ATIVAN) 0.5 MG tablet Take 1 tablet (0.5 mg total) by mouth every 8 (eight) hours as needed for anxiety. (Patient taking differently: Take 0.5 mg by mouth daily as needed for anxiety. ) 30 tablet 0  . losartan (COZAAR) 100 MG tablet TAKE 1 TABLET(100 MG) BY MOUTH DAILY 30 tablet 5  . montelukast (SINGULAIR) 10 MG tablet Take 1 tablet (10 mg total) by mouth at bedtime. 30 tablet 3  . OXYGEN Inhale 2 L into the lungs at bedtime as needed (at bedtime and as needed).    . polyethylene glycol powder (GLYCOLAX/MIRALAX) powder Take 17 g by mouth 2 (two) times daily as needed. (Patient taking differently: Take 17 g by mouth 2 (two) times daily as needed for mild constipation. ) 3350 g 1  . traMADol (ULTRAM) 50 MG tablet 1-2 tabs po q 6 hr prn pain Maximum dose= 8 tablets per day (Patient taking differently: Take 50-100 mg by mouth 3 (three) times daily as needed for severe pain. ) 20 tablet 0  . umeclidinium-vilanterol (ANORO ELLIPTA) 62.5-25 MCG/INH AEPB Inhale 1 puff into the lungs daily. 1 each 11   No facility-administered medications prior to  visit.     Review of Systems;  Patient denies headache, fevers, malaise, unintentional weight loss, skin rash, eye pain, sinus congestion and sinus pain, sore throat, dysphagia,  hemoptysis , cough, dyspnea, wheezing, chest pain, palpitations, orthopnea, edema, abdominal pain, nausea, melena, diarrhea, constipation, flank pain, dysuria, hematuria, urinary  Frequency, nocturia, numbness, tingling, seizures,  Focal weakness, Loss of consciousness,  Tremor, insomnia, depression, anxiety, and suicidal ideation.      Objective:  BP 98/60   Pulse 91   Temp 98.1 F (36.7 C) (Oral)   Resp 17   Ht 5\' 2"   (1.575 m)   Wt 217 lb (98.4 kg)   SpO2 98%   BMI 39.69 kg/m   BP Readings from Last 3 Encounters:  07/04/16 98/60  06/26/16 129/62  06/26/16 104/60    Wt Readings from Last 3 Encounters:  07/04/16 217 lb (98.4 kg)  06/26/16 217 lb 4 oz (98.5 kg)  06/24/16 218 lb (98.9 kg)    General appearance: alert, cooperative and appears stated age Ears: normal TM's and external ear canals both ears Throat: lips, mucosa, and tongue normal; teeth and gums normal Neck: no adenopathy, no carotid bruit, supple, symmetrical, trachea midline and thyroid not enlarged, symmetric, no tenderness/mass/nodules Back: symmetric, no curvature. ROM normal. No CVA tenderness. Lungs: bilaterally wheezing, fair air movement.  Heart: regular rate and rhythm, S1, S2 normal, no murmur, click, rub or gallop Abdomen: soft, non-tender; bowel sounds normal; no masses,  no organomegaly Pulses: 2+ and symmetric Skin: Skin color, texture, turgor normal. No rashes or lesions Lymph nodes: Cervical, supraclavicular, and axillary nodes normal.  Lab Results  Component Value Date   HGBA1C 5.4 07/28/2015    Lab Results  Component Value Date   CREATININE 0.70 06/05/2016   CREATININE 0.73 05/22/2016   CREATININE 0.84 05/20/2016    Lab Results  Component Value Date   WBC 8.6 06/05/2016   HGB 11.5 (L) 06/05/2016   HCT 34.3 (L) 06/05/2016   PLT 180 06/05/2016   GLUCOSE 114 (H) 06/05/2016   CHOL 173 03/14/2015   TRIG 85.0 03/14/2015   HDL 57.10 03/14/2015   LDLCALC 99 03/14/2015   ALT 13 01/04/2016   AST 16 01/04/2016   NA 140 06/05/2016   K 3.7 06/05/2016   CL 104 06/05/2016   CREATININE 0.70 06/05/2016   BUN 19 06/05/2016   CO2 29 06/05/2016   TSH 1.02 03/14/2015   INR 0.99 06/05/2016   HGBA1C 5.4 07/28/2015    No results found.  Assessment & Plan:   Problem List Items Addressed This Visit    COPD exacerbation ([place])    Will treat with solumedrol 80 mg IM.  She does  have a productive cough,  So  will prescribed doxycycline, probiotic and cough suppressant.       Relevant Medications   benzonatate (TESSALON) 200 MG capsule   methylPREDNISolone acetate (DEPO-MEDROL) injection 80 mg (Completed)   Severe mitral regurgitation (Chronic)    Severe, 4+ ,  With normal ef, high cardiac output, moderate pulmonary hypertension.  She is scheduled for a  for mitral valve replacement on April 19th        Other Visit Diagnoses    Cough       Relevant Medications   methylPREDNISolone acetate (DEPO-MEDROL) injection 80 mg (Completed)      I am having Ms. Riede start on doxycycline and benzonatate. I am also having her maintain her loratadine-pseudoephedrine, levothyroxine, polyethylene glycol powder, albuterol, fluticasone, fluticasone, hydrochlorothiazide, OXYGEN, traMADol,  albuterol, LORazepam, umeclidinium-vilanterol, hydroxypropyl methylcellulose / hypromellose, losartan, famotidine, acetaminophen, doxycycline, ipratropium, amiodarone, and montelukast. We administered methylPREDNISolone acetate.  Meds ordered this encounter  Medications  . doxycycline (VIBRA-TABS) 100 MG tablet    Sig: Take 1 tablet (100 mg total) by mouth 2 (two) times daily.    Dispense:  14 tablet    Refill:  0  . benzonatate (TESSALON) 200 MG capsule    Sig: Take 1 capsule (200 mg total) by mouth 3 (three) times daily as needed for cough.    Dispense:  60 capsule    Refill:  1  . DISCONTD: methylPREDNISolone acetate (DEPO-MEDROL) injection 80 mg  . methylPREDNISolone acetate (DEPO-MEDROL) injection 80 mg    Medications Discontinued During This Encounter  Medication Reason  . methylPREDNISolone acetate (DEPO-MEDROL) injection 80 mg     Follow-up: Return in about 3 days (around 07/07/2016), or RN VISIT FOR BREATHING CHECK .   [name], MD

## 2016-07-06 DIAGNOSIS — J449 Chronic obstructive pulmonary disease, unspecified: Secondary | ICD-10-CM | POA: Diagnosis not present

## 2016-07-06 NOTE — Assessment & Plan Note (Signed)
Will treat with solumedrol 80 mg IM.  She does  have a productive cough,  So will prescribed doxycycline, probiotic and cough suppressant.

## 2016-07-06 NOTE — Assessment & Plan Note (Signed)
Severe, 4+ ,  With normal ef, high cardiac output, moderate pulmonary hypertension.  She is scheduled for a  for mitral valve replacement on April 19th

## 2016-07-07 ENCOUNTER — Encounter: Payer: Self-pay | Admitting: *Deleted

## 2016-07-07 ENCOUNTER — Ambulatory Visit (INDEPENDENT_AMBULATORY_CARE_PROVIDER_SITE_OTHER): Payer: Medicare Other | Admitting: *Deleted

## 2016-07-07 VITALS — BP 130/74 | HR 94 | Resp 20

## 2016-07-07 DIAGNOSIS — R062 Wheezing: Secondary | ICD-10-CM | POA: Diagnosis not present

## 2016-07-07 MED ORDER — METHYLPREDNISOLONE ACETATE 80 MG/ML IJ SUSP
80.0000 mg | Freq: Once | INTRAMUSCULAR | Status: AC
  Administered 2016-07-07: 80 mg via INTRAMUSCULAR

## 2016-07-07 NOTE — Progress Notes (Signed)
Patient presented for breathing check patient 02 sat @ 97% on room air, respirations 20, BP 130/74 radial pulse 94. On ascultation bilateral wheezing in upper lobes of lungs that cleared temporarily with patient coughing up thick white mucus.  Bilateral lower lobes weer noted to have wheezing, rales, with crackles that did not clear with cough. Advised PCP and was given verbal for 80 mg Depo-Medrol once IM. Patient Injected upper outter quadrant, patient voiced no concern or distress during injection.

## 2016-07-08 ENCOUNTER — Ambulatory Visit
Admission: RE | Admit: 2016-07-08 | Discharge: 2016-07-08 | Disposition: A | Discharge status: Home / Self Care | Payer: Medicare Other | Source: Ambulatory Visit | Attending: Thoracic Surgery (Cardiothoracic Vascular Surgery) | Admitting: Thoracic Surgery (Cardiothoracic Vascular Surgery)

## 2016-07-08 DIAGNOSIS — I7789 Other specified disorders of arteries and arterioles: Secondary | ICD-10-CM | POA: Diagnosis not present

## 2016-07-08 DIAGNOSIS — I340 Nonrheumatic mitral (valve) insufficiency: Secondary | ICD-10-CM | POA: Diagnosis not present

## 2016-07-08 DIAGNOSIS — I7101 Dissection of thoracic aorta: Secondary | ICD-10-CM

## 2016-07-08 DIAGNOSIS — Q600 Renal agenesis, unilateral: Secondary | ICD-10-CM | POA: Insufficient documentation

## 2016-07-08 DIAGNOSIS — K4090 Unilateral inguinal hernia, without obstruction or gangrene, not specified as recurrent: Secondary | ICD-10-CM | POA: Diagnosis not present

## 2016-07-08 DIAGNOSIS — I700 Atherosclerosis of aorta: Secondary | ICD-10-CM | POA: Diagnosis not present

## 2016-07-08 DIAGNOSIS — Z01818 Encounter for other preprocedural examination: Secondary | ICD-10-CM | POA: Diagnosis not present

## 2016-07-08 DIAGNOSIS — I771 Stricture of artery: Secondary | ICD-10-CM | POA: Insufficient documentation

## 2016-07-08 DIAGNOSIS — Q278 Other specified congenital malformations of peripheral vascular system: Secondary | ICD-10-CM | POA: Diagnosis not present

## 2016-07-08 DIAGNOSIS — I71019 Dissection of thoracic aorta, unspecified: Secondary | ICD-10-CM

## 2016-07-08 DIAGNOSIS — I7409 Other arterial embolism and thrombosis of abdominal aorta: Secondary | ICD-10-CM

## 2016-07-08 DIAGNOSIS — M419 Scoliosis, unspecified: Secondary | ICD-10-CM | POA: Diagnosis not present

## 2016-07-08 MED ORDER — IOPAMIDOL (ISOVUE-370) INJECTION 76%
100.0000 mL | Freq: Once | INTRAVENOUS | Status: AC | PRN
  Administered 2016-07-08: 100 mL via INTRAVENOUS

## 2016-07-08 NOTE — Progress Notes (Signed)
  I have reviewed the above information and agree with above.   Karyssa Amaral, MD 

## 2016-07-09 ENCOUNTER — Encounter: Payer: Self-pay | Admitting: *Deleted

## 2016-07-09 ENCOUNTER — Ambulatory Visit (INDEPENDENT_AMBULATORY_CARE_PROVIDER_SITE_OTHER): Payer: Medicare Other

## 2016-07-09 ENCOUNTER — Ambulatory Visit
Admission: EM | Admit: 2016-07-09 | Discharge: 2016-07-09 | Disposition: A | Discharge status: Home / Self Care | Payer: Medicare Other | Attending: Family Medicine | Admitting: Family Medicine

## 2016-07-09 DIAGNOSIS — R05 Cough: Secondary | ICD-10-CM | POA: Diagnosis not present

## 2016-07-09 DIAGNOSIS — R062 Wheezing: Secondary | ICD-10-CM | POA: Diagnosis not present

## 2016-07-09 DIAGNOSIS — J441 Chronic obstructive pulmonary disease with (acute) exacerbation: Secondary | ICD-10-CM | POA: Diagnosis not present

## 2016-07-09 MED ORDER — IPRATROPIUM-ALBUTEROL 0.5-2.5 (3) MG/3ML IN SOLN
3.0000 mL | Freq: Once | RESPIRATORY_TRACT | Status: AC
  Administered 2016-07-09: 3 mL via RESPIRATORY_TRACT

## 2016-07-09 MED ORDER — METHYLPREDNISOLONE SODIUM SUCC 125 MG IJ SOLR
80.0000 mg | Freq: Once | INTRAMUSCULAR | Status: AC
  Administered 2016-07-09: 80 mg via INTRAMUSCULAR

## 2016-07-09 NOTE — ED Provider Notes (Signed)
[facility]    CSN: [id number] Arrival date & time: 07/09/16  1225     History   Chief Complaint Chief Complaint  Patient presents with  . Cough  . Wheezing  . Shortness of Breath  . Chills    HPI Lauren Bartlett is a 61 y.o. female.   62 yo female with a c/o wheezing and cough for about 2 weeks. States that she has been seen by her PCP last week , was started on doxycycline and given 80mg  solumedrol IM on 2 different occasions last week. Patient states she's noticed some improvement, however still having wheezing and cough.    The history is provided by the patient.  Cough  Associated symptoms: shortness of breath and wheezing   Wheezing  Associated symptoms: cough and shortness of breath   Shortness of Breath  Associated symptoms: cough and wheezing     Past Medical History:  Diagnosis Date  . Anxiety   . Chronic diastolic congestive heart failure ([place])   . Congenital absence of one kidney   . COPD (chronic obstructive pulmonary disease) ([place])   . Depression    treated at [facility]  . Emphysema/COPD ([place])   . History of sciatica   . Hypertension   . hypothyrodism   . Hypothyroidism   . Motion sickness    cars  . MVP (mitral valve prolapse)   . PONV (postoperative nausea and vomiting)   . Pulmonary hypertension   . Severe mitral regurgitation   . Sleep apnea    O2 at night and PRN    Patient Active Problem List   Diagnosis Date Noted  . Chronic diastolic congestive heart failure ([place])   . Hypoxia   . Influenza B 05/20/2016  . COPD exacerbation ([place]) 05/19/2016  . COPD (chronic obstructive pulmonary disease) ([place]) 05/19/2016  . 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  . Constipation 08/05/2015  . Hospital discharge follow-up 08/05/2015  . Chronic respiratory failure with hypoxia ([place])   . Encounter for preventive health examination 03/14/2015  . Breast cancer screening 03/14/2015  . Need for hepatitis C screening test 03/14/2015  . Vitamin D  deficiency 01/11/2015  . S/P hysterectomy with oophorectomy 09/22/2013  . Pulmonary hypertension ([place])   . Hypothyroidism 05/04/2012  . Severe mitral regurgitation   . Obesity (BMI 30-39.9) 08/04/2011  . Depression   . Hypertension   . Congenital absence of one kidney   . History of sciatica     Past Surgical History:  Procedure Laterality Date  . ABDOMINAL HYSTERECTOMY    . BREAST BIOPSY Right 2011   [facility]< benign  . CATARACT EXTRACTION W/PHACO Left 11/14/2015   Procedure: CATARACT EXTRACTION PHACO AND INTRAOCULAR LENS PLACEMENT (IOC);  Surgeon: Leandrew Koyanagi, MD;  Location: [facility];  Service: Ophthalmology;  Laterality: Left;  sleep apnea [place]  . CATARACT EXTRACTION W/PHACO Right 12/19/2015   Procedure: CATARACT EXTRACTION PHACO AND INTRAOCULAR LENS PLACEMENT (IOC);  Surgeon: Leandrew Koyanagi, MD;  Location: [facility];  Service: Ophthalmology;  Laterality: Right;  ANXIETY GENEROUS IV SEDATION TORIC LEN  . COMBINED HYSTERECTOMY ABDOMINAL W/ A&P REPAIR / OOPHORECTOMY  1996   benign tumor  . INNER EAR SURGERY     bilateral  . RIGHT/LEFT HEART CATH AND CORONARY ANGIOGRAPHY Bilateral 06/16/2016   Procedure: Right/Left Heart Cath and Coronary Angiography;  Surgeon: Wellington Hampshire, MD;  Location: [facility];  Service: Cardiovascular;  Laterality: Bilateral;  . TEE WITHOUT CARDIOVERSION N/A 05/19/2016   Procedure: TRANSESOPHAGEAL ECHOCARDIOGRAM (  TEE);  Surgeon: Wellington Hampshire, MD;  Location: [facility];  Service: Cardiovascular;  Laterality: N/A;  . TEE WITHOUT CARDIOVERSION N/A 06/09/2016   Procedure: TRANSESOPHAGEAL ECHOCARDIOGRAM (TEE);  Surgeon: Wellington Hampshire, MD;  Location: [facility];  Service: Cardiovascular;  Laterality: N/A;  . TEE WITHOUT CARDIOVERSION N/A 06/16/2016   Procedure: TRANSESOPHAGEAL ECHOCARDIOGRAM (TEE);  Surgeon: Wellington Hampshire, MD;  Location: [facility];  Service: Cardiovascular;  Laterality: N/A;    OB History    No data  available       Home Medications    Prior to Admission medications   Medication Sig Start Date End Date Taking? Authorizing Provider  acetaminophen (TYLENOL) 500 MG tablet Take 500 mg by mouth 2 (two) times daily as needed for moderate pain or headache.    Historical Provider, MD  albuterol (PROVENTIL HFA;VENTOLIN HFA) 108 (90 Base) MCG/ACT inhaler Inhale 2 puffs into the lungs every 6 (six) hours as needed for wheezing or shortness of breath. Pt uses with SPACER.    Historical Provider, MD  albuterol (PROVENTIL) (2.5 MG/3ML) 0.083% nebulizer solution Take 2.5 mg by nebulization every 6 (six) hours as needed for wheezing or shortness of breath.    Historical Provider, MD  amiodarone (PACERONE) 200 MG tablet TAKE 1 TABLET BY MOUTH DAILY Patient not taking: Reported on 06/26/2016 06/24/16   Rexene Alberts, MD  benzonatate (TESSALON) 200 MG capsule Take 1 capsule (200 mg total) by mouth 3 (three) times daily as needed for cough. Patient not taking: Reported on 07/09/2016 07/04/16   [name], MD  doxycycline (VIBRA-TABS) 100 MG tablet Take 1 tablet (100 mg total) by mouth 2 (two) times daily. Patient taking differently: Take 100 mg by mouth 2 (two) times daily. 7 day course started 07-04-16 should be done 07-11-16 06/23/16   Laverle Hobby, MD  doxycycline (VIBRA-TABS) 100 MG tablet Take 1 tablet (100 mg total) by mouth 2 (two) times daily. Patient not taking: Reported on 07/09/2016 07/04/16   [name], MD  famotidine (PEPCID AC) 10 MG chewable tablet Chew 10 mg by mouth daily as needed for heartburn.    Historical Provider, MD  fluticasone (FLONASE) 50 MCG/ACT nasal spray Place 2 sprays into both nostrils daily.    Historical Provider, MD  fluticasone (FLOVENT HFA) 110 MCG/ACT inhaler Inhale 1 puff into the lungs 2 (two) times daily.    Historical Provider, MD  hydrochlorothiazide (HYDRODIURIL) 25 MG tablet TAKE 1/2 TABLET(12.5 MG) BY MOUTH DAILY 10/22/15   [name], MD    hydroxypropyl methylcellulose / hypromellose (ISOPTO TEARS / GONIOVISC) 2.5 % ophthalmic solution Place 1 drop into both eyes 2 (two) times daily.    Historical Provider, MD  Hypromellose (ISOPTO TEARS) 0.5 % SOLN Apply 1 drop to eye 2 (two) times daily.    Historical Provider, MD  ipratropium (ATROVENT) 0.03 % nasal spray USE 2 SPRAYS NASALLY THREE TIMES DAILY 06/23/16   Laverle Hobby, MD  levothyroxine (SYNTHROID, LEVOTHROID) 112 MCG tablet Take 112 mcg by mouth daily before breakfast.    Historical Provider, MD  loratadine-pseudoephedrine (CLARITIN-D 24-HOUR) 10-240 MG 24 hr tablet Take 1 tablet by mouth daily.    Historical Provider, MD  LORazepam (ATIVAN) 0.5 MG tablet Take 1 tablet (0.5 mg total) by mouth every 8 (eight) hours as needed for anxiety. 04/18/16 04/18/17  Earleen Newport, MD  losartan (COZAAR) 100 MG tablet TAKE 1 TABLET(100 MG) BY MOUTH DAILY 05/26/16   [name], MD  montelukast (SINGULAIR)  10 MG tablet Take 1 tablet (10 mg total) by mouth at bedtime. 06/30/16   [name], MD  OXYGEN Inhale 2 L into the lungs at bedtime as needed (at bedtime and as needed).    Historical Provider, MD  polyethylene glycol powder (GLYCOLAX/MIRALAX) powder Take 17 g by mouth 2 (two) times daily as needed. Patient taking differently: Take 17 g by mouth daily as needed for mild constipation.  08/03/15   [name], MD  traMADol (ULTRAM) 50 MG tablet 1-2 tabs po q 6 hr prn pain Maximum dose= 8 tablets per day Patient not taking: Reported on 07/09/2016 11/09/15   Melynda Ripple, MD  umeclidinium-vilanterol [facility] ELLIPTA) 62.5-25 MCG/INH AEPB Inhale 1 puff into the lungs daily. 04/28/16   [name], MD    Family History Family History  Problem Relation Age of Onset  . Multiple sclerosis Mother   . Coronary artery disease Father   . Heart disease Brother   . Breast cancer Paternal Aunt     Social History Social History  Substance Use Topics  . Smoking status: Never  Smoker  . Smokeless tobacco: Never Used  . Alcohol use No     Allergies   Codeine; Levaquin [levofloxacin]; Penicillins; Zithromax [azithromycin]; and Prednisone   Review of Systems Review of Systems  Respiratory: Positive for cough, shortness of breath and wheezing.      Physical Exam Triage Vital Signs ED Triage Vitals  Enc Vitals Group     BP 07/09/16 1257 114/63     Pulse Rate 07/09/16 1257 91     Resp 07/09/16 1257 20     Temp 07/09/16 1300 98.3 F (36.8 C)     Temp Source 07/09/16 1257 Oral     SpO2 07/09/16 1257 98 %     Weight 07/09/16 1258 218 lb (98.9 kg)     Height 07/09/16 1258 5\' 2"  (1.575 m)     Head Circumference --      Peak Flow --      Pain Score --      Pain Loc --      Pain Edu? --      Excl. in [place]? --    No data found.   Updated Vital Signs BP 114/63 (BP Location: Left Arm)   Pulse 91   Temp 98.3 F (36.8 C) (Oral)   Resp 20   Ht 5\' 2"  (1.575 m)   Wt 218 lb (98.9 kg)   SpO2 98%   BMI 39.87 kg/m   Visual Acuity Right Eye Distance:   Left Eye Distance:   Bilateral Distance:    Right Eye Near:   Left Eye Near:    Bilateral Near:     Physical Exam  Constitutional: She appears well-developed and well-nourished. No distress.  HENT:  Head: Normocephalic and atraumatic.  Right Ear: Tympanic membrane, external ear and ear canal normal.  Left Ear: Tympanic membrane, external ear and ear canal normal.  Nose: No mucosal edema, rhinorrhea, nose lacerations, sinus tenderness, nasal deformity, septal deviation or nasal septal hematoma. No epistaxis.  No foreign bodies. Right sinus exhibits no maxillary sinus tenderness and no frontal sinus tenderness. Left sinus exhibits no maxillary sinus tenderness and no frontal sinus tenderness.  Mouth/Throat: Uvula is midline, oropharynx is clear and moist and mucous membranes are normal. No oropharyngeal exudate.  Eyes: Conjunctivae and EOM are normal. Pupils are equal, round, and reactive to light.  Right eye exhibits no discharge. Left eye exhibits no  discharge. No scleral icterus.  Neck: Normal range of motion. Neck supple. No thyromegaly present.  Cardiovascular: Normal rate, regular rhythm and normal heart sounds.   Pulmonary/Chest: Effort normal. No respiratory distress. She has wheezes. She has no rales.  Lymphadenopathy:    She has no cervical adenopathy.  Skin: She is not diaphoretic.  Nursing note and vitals reviewed.    UC Treatments / Results  Labs (all labs ordered are listed, but only abnormal results are displayed) Labs Reviewed - No data to display  EKG  EKG Interpretation None       Radiology Dg Chest 2 View  Result Date: 07/09/2016 CLINICAL DATA:  Cough productive of green material, fever, dyspnea, congestion, and chills for 2 weeks, history COPD, pulmonary hypertension, chronic diastolic CHF, hypertension EXAM: CHEST  2 VIEW COMPARISON:  05/21/2016 FINDINGS: Mild enlargement of cardiac silhouette. Mediastinal contours and pulmonary vascularity normal. Atherosclerotic calcification aorta. Bibasilar atelectasis. Lungs otherwise clear. No pleural effusion or pneumothorax. Diffuse osseous demineralization. IMPRESSION: Enlargement of cardiac silhouette with bibasilar atelectasis. Aortic atherosclerosis. Electronically Signed   By: Lavonia Dana M.D.   On: 07/09/2016 13:58   Ct Angio Chest Aorta W &/or Wo Contrast  Result Date: 07/08/2016 CLINICAL DATA:  Mitral valve insufficiency and preoperative evaluation. Evaluate for atherosclerotic disease and occlusive disease. EXAM: CT ANGIOGRAPHY CHEST, ABDOMEN AND PELVIS TECHNIQUE: Multidetector CT imaging through the chest, abdomen and pelvis was performed using the standard protocol during bolus administration of intravenous contrast. Multiplanar reconstructed images and MIPs were obtained and reviewed to evaluate the vascular anatomy. CONTRAST:  100 mL Isovue 370 COMPARISON:  None. FINDINGS: CTA CHEST FINDINGS Cardiovascular:  Aberrant right subclavian artery without aneurysm at the origin. The right subclavian artery is posterior to the esophagus and distal to the left subclavian artery. Great vessels are patent. Normal caliber of the thoracic aorta without dissection. Minimal atherosclerotic disease of the aortic arch. Overall, no significant atherosclerotic disease in the chest. Main pulmonary arteries appear to be patent. Enlargement of the main pulmonary artery measuring up to 3.5 cm. Enlargement of the left atrium compatible with mitral valve disease. Mediastinum/Nodes: No lymph node enlargement in the mediastinum, hila or axillary regions. Lungs/Pleura: No pleural effusions. Lungs are clear without airspace disease or consolidation. Musculoskeletal: Dextroscoliosis of the thoracic spine. Review of the MIP images confirms the above findings. CTA ABDOMEN AND PELVIS FINDINGS VASCULAR Aorta: The abdominal aorta is mildly tortuous related to the scoliosis. Normal caliber of the abdominal aorta without aneurysm or dissection. Minimal calcified plaque in the distal abdominal aorta. Celiac: Limited evaluation due to motion artifact. Celiac trunk is widely patent. Main branches appear to be patent. SMA: SMA is patent without significant plaque or stenosis. Renals: Solitary right kidney. Right renal artery is patent without significant plaque or stenosis. IMA: IMA is patent. Inflow: Iliac arteries are mildly tortuous without stenosis. Small amount of calcified plaque in the right common iliac artery and right internal iliac artery. Proximal femoral arteries are patent. Veins: No obvious venous abnormality within the limitations of this arterial phase study. Review of the MIP images confirms the above findings. NON-VASCULAR Hepatobiliary: Normal appearance of the liver and gallbladder. Pancreas: Normal appearance of the pancreas without inflammation or duct dilatation. Spleen: Normal appearance of spleen without enlargement. Adrenals/Urinary  Tract: Bilateral adrenal glands appear normal. Solitary right kidney. Large amount of motion artifact limits evaluation of the right kidney but no gross abnormality to the right kidney. Negative for hydronephrosis. Absent left kidney appears to be congenital. Normal  appearance of the urinary bladder. Stomach/Bowel: Small hiatal hernia. Scattered colonic diverticula without acute inflammatory changes. No evidence for bowel obstruction or bowel dilatation. Lymphatic: No significant lymph node enlargement in the abdomen or pelvis. Reproductive: Status post hysterectomy. No adnexal masses. Other: No free fluid. Right inguinal hernia containing fat. Small ventral hernia containing fat near the umbilicus. Musculoskeletal: Levoscoliosis in the lumbar spine. Review of the MIP images confirms the above findings. IMPRESSION: Minimal atherosclerotic disease in the chest, abdomen and pelvis. No significant arterial stenosis. Tortuosity of the aorta and iliac arteries related to scoliosis. Aberrant right subclavian artery. Solitary right kidney. 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 Electronically Signed   By: Markus Daft M.D.   On: 07/08/2016 13:37   Ct Angio Abd/pel W/ And/or W/o  Result Date: 07/08/2016 CLINICAL DATA:  Mitral valve insufficiency and preoperative evaluation. Evaluate for atherosclerotic disease and occlusive disease. EXAM: CT ANGIOGRAPHY CHEST, ABDOMEN AND PELVIS TECHNIQUE: Multidetector CT imaging through the chest, abdomen and pelvis was performed using the standard protocol during bolus administration of intravenous contrast. Multiplanar reconstructed images and MIPs were obtained and reviewed to evaluate the vascular anatomy. CONTRAST:  100 mL Isovue 370 COMPARISON:  None. FINDINGS: CTA CHEST FINDINGS Cardiovascular: Aberrant right subclavian artery without aneurysm at the origin. The right subclavian artery is posterior to the esophagus and distal to the left subclavian artery.  Great vessels are patent. Normal caliber of the thoracic aorta without dissection. Minimal atherosclerotic disease of the aortic arch. Overall, no significant atherosclerotic disease in the chest. Main pulmonary arteries appear to be patent. Enlargement of the main pulmonary artery measuring up to 3.5 cm. Enlargement of the left atrium compatible with mitral valve disease. Mediastinum/Nodes: No lymph node enlargement in the mediastinum, hila or axillary regions. Lungs/Pleura: No pleural effusions. Lungs are clear without airspace disease or consolidation. Musculoskeletal: Dextroscoliosis of the thoracic spine. Review of the MIP images confirms the above findings. CTA ABDOMEN AND PELVIS FINDINGS VASCULAR Aorta: The abdominal aorta is mildly tortuous related to the scoliosis. Normal caliber of the abdominal aorta without aneurysm or dissection. Minimal calcified plaque in the distal abdominal aorta. Celiac: Limited evaluation due to motion artifact. Celiac trunk is widely patent. Main branches appear to be patent. SMA: SMA is patent without significant plaque or stenosis. Renals: Solitary right kidney. Right renal artery is patent without significant plaque or stenosis. IMA: IMA is patent. Inflow: Iliac arteries are mildly tortuous without stenosis. Small amount of calcified plaque in the right common iliac artery and right internal iliac artery. Proximal femoral arteries are patent. Veins: No obvious venous abnormality within the limitations of this arterial phase study. Review of the MIP images confirms the above findings. NON-VASCULAR Hepatobiliary: Normal appearance of the liver and gallbladder. Pancreas: Normal appearance of the pancreas without inflammation or duct dilatation. Spleen: Normal appearance of spleen without enlargement. Adrenals/Urinary Tract: Bilateral adrenal glands appear normal. Solitary right kidney. Large amount of motion artifact limits evaluation of the right kidney but no gross abnormality  to the right kidney. Negative for hydronephrosis. Absent left kidney appears to be congenital. Normal appearance of the urinary bladder. Stomach/Bowel: Small hiatal hernia. Scattered colonic diverticula without acute inflammatory changes. No evidence for bowel obstruction or bowel dilatation. Lymphatic: No significant lymph node enlargement in the abdomen or pelvis. Reproductive: Status post hysterectomy. No adnexal masses. Other: No free fluid. Right inguinal hernia containing fat. Small ventral hernia containing fat near the umbilicus. Musculoskeletal: Levoscoliosis in the lumbar spine. Review of  the MIP images confirms the above findings. IMPRESSION: Minimal atherosclerotic disease in the chest, abdomen and pelvis. No significant arterial stenosis. Tortuosity of the aorta and iliac arteries related to scoliosis. Aberrant right subclavian artery. Solitary right kidney. 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 Electronically Signed   By: Markus Daft M.D.   On: 07/08/2016 13:37    Procedures Procedures (including critical care time)  Medications Ordered in UC Medications  methylPREDNISolone sodium succinate (SOLU-MEDROL) 125 mg/2 mL injection 80 mg (not administered)  ipratropium-albuterol (DUONEB) 0.5-2.5 (3) MG/3ML nebulizer solution 3 mL (3 mLs Nebulization Given 07/09/16 1331)     Initial Impression / Assessment and Plan / UC Course  I have reviewed the triage vital signs and the nursing notes.  Pertinent labs & imaging results that were available during my care of the patient were reviewed by me and considered in my medical decision making (see chart for details).       Final Clinical Impressions(s) / UC Diagnoses   Final diagnoses:  COPD exacerbation ([place])    New Prescriptions New Prescriptions   No medications on file   1. x-ray results and diagnosis reviewed with patient 2. rx as per orders above; reviewed possible side effects, interactions, risks and  benefits  3. Recommend continue current home meds 4. Follow-up prn if symptoms worsen or don't improve   Norval Gable, MD 07/09/16 1414

## 2016-07-09 NOTE — Discharge Instructions (Signed)
Continue and finish current antibiotic course Continue home medications/inhalers

## 2016-07-09 NOTE — ED Triage Notes (Signed)
Productive cough- green, dyspnea, fever, congestion, chills, x2 weeks.

## 2016-07-14 ENCOUNTER — Ambulatory Visit (INDEPENDENT_AMBULATORY_CARE_PROVIDER_SITE_OTHER): Payer: Medicare Other | Admitting: Thoracic Surgery (Cardiothoracic Vascular Surgery)

## 2016-07-14 ENCOUNTER — Ambulatory Visit (HOSPITAL_BASED_OUTPATIENT_CLINIC_OR_DEPARTMENT_OTHER)
Admission: RE | Admit: 2016-07-14 | Discharge: 2016-07-14 | Disposition: A | Discharge status: Home / Self Care | Payer: Medicare Other | Source: Ambulatory Visit | Attending: Thoracic Surgery (Cardiothoracic Vascular Surgery) | Admitting: Thoracic Surgery (Cardiothoracic Vascular Surgery)

## 2016-07-14 ENCOUNTER — Other Ambulatory Visit (HOSPITAL_COMMUNITY): Payer: Medicare Other

## 2016-07-14 ENCOUNTER — Encounter (HOSPITAL_COMMUNITY)
Admission: RE | Admit: 2016-07-14 | Discharge: 2016-07-14 | Disposition: A | Discharge status: Home / Self Care | Payer: Medicare Other | Source: Ambulatory Visit | Attending: Thoracic Surgery (Cardiothoracic Vascular Surgery) | Admitting: Thoracic Surgery (Cardiothoracic Vascular Surgery)

## 2016-07-14 ENCOUNTER — Ambulatory Visit (HOSPITAL_COMMUNITY)
Admission: RE | Admit: 2016-07-14 | Discharge: 2016-07-14 | Disposition: A | Discharge status: Home / Self Care | Payer: Medicare Other | Source: Ambulatory Visit | Attending: Thoracic Surgery (Cardiothoracic Vascular Surgery) | Admitting: Thoracic Surgery (Cardiothoracic Vascular Surgery)

## 2016-07-14 ENCOUNTER — Encounter (HOSPITAL_COMMUNITY): Payer: Self-pay

## 2016-07-14 VITALS — BP 150/89 | HR 92 | Resp 20 | Ht 62.0 in | Wt 218.0 lb

## 2016-07-14 DIAGNOSIS — J9811 Atelectasis: Secondary | ICD-10-CM | POA: Diagnosis not present

## 2016-07-14 DIAGNOSIS — I517 Cardiomegaly: Secondary | ICD-10-CM | POA: Insufficient documentation

## 2016-07-14 DIAGNOSIS — I272 Pulmonary hypertension, unspecified: Secondary | ICD-10-CM | POA: Diagnosis not present

## 2016-07-14 DIAGNOSIS — J9610 Chronic respiratory failure, unspecified whether with hypoxia or hypercapnia: Secondary | ICD-10-CM | POA: Diagnosis not present

## 2016-07-14 DIAGNOSIS — I480 Paroxysmal atrial fibrillation: Secondary | ICD-10-CM | POA: Diagnosis not present

## 2016-07-14 DIAGNOSIS — Z0183 Encounter for blood typing: Secondary | ICD-10-CM | POA: Insufficient documentation

## 2016-07-14 DIAGNOSIS — Z888 Allergy status to other drugs, medicaments and biological substances status: Secondary | ICD-10-CM | POA: Diagnosis not present

## 2016-07-14 DIAGNOSIS — J439 Emphysema, unspecified: Secondary | ICD-10-CM | POA: Diagnosis not present

## 2016-07-14 DIAGNOSIS — J398 Other specified diseases of upper respiratory tract: Secondary | ICD-10-CM | POA: Diagnosis not present

## 2016-07-14 DIAGNOSIS — D62 Acute posthemorrhagic anemia: Secondary | ICD-10-CM | POA: Diagnosis not present

## 2016-07-14 DIAGNOSIS — I5032 Chronic diastolic (congestive) heart failure: Secondary | ICD-10-CM

## 2016-07-14 DIAGNOSIS — Z01812 Encounter for preprocedural laboratory examination: Secondary | ICD-10-CM

## 2016-07-14 DIAGNOSIS — Z880 Allergy status to penicillin: Secondary | ICD-10-CM | POA: Diagnosis not present

## 2016-07-14 DIAGNOSIS — Z01818 Encounter for other preprocedural examination: Secondary | ICD-10-CM | POA: Insufficient documentation

## 2016-07-14 DIAGNOSIS — I340 Nonrheumatic mitral (valve) insufficiency: Secondary | ICD-10-CM

## 2016-07-14 DIAGNOSIS — Z886 Allergy status to analgesic agent status: Secondary | ICD-10-CM | POA: Diagnosis not present

## 2016-07-14 DIAGNOSIS — I4581 Long QT syndrome: Secondary | ICD-10-CM | POA: Diagnosis not present

## 2016-07-14 DIAGNOSIS — I081 Rheumatic disorders of both mitral and tricuspid valves: Secondary | ICD-10-CM | POA: Diagnosis not present

## 2016-07-14 DIAGNOSIS — Q211 Atrial septal defect: Secondary | ICD-10-CM | POA: Diagnosis not present

## 2016-07-14 DIAGNOSIS — I341 Nonrheumatic mitral (valve) prolapse: Secondary | ICD-10-CM | POA: Diagnosis not present

## 2016-07-14 DIAGNOSIS — Z881 Allergy status to other antibiotic agents status: Secondary | ICD-10-CM | POA: Diagnosis not present

## 2016-07-14 DIAGNOSIS — I110 Hypertensive heart disease with heart failure: Secondary | ICD-10-CM | POA: Diagnosis not present

## 2016-07-14 DIAGNOSIS — R112 Nausea with vomiting, unspecified: Secondary | ICD-10-CM | POA: Diagnosis not present

## 2016-07-14 DIAGNOSIS — I511 Rupture of chordae tendineae, not elsewhere classified: Secondary | ICD-10-CM | POA: Diagnosis not present

## 2016-07-14 DIAGNOSIS — E039 Hypothyroidism, unspecified: Secondary | ICD-10-CM | POA: Diagnosis not present

## 2016-07-14 DIAGNOSIS — Q600 Renal agenesis, unilateral: Secondary | ICD-10-CM | POA: Diagnosis not present

## 2016-07-14 HISTORY — DX: Pneumonia, unspecified organism: J18.9

## 2016-07-14 HISTORY — DX: Dyspnea, unspecified: R06.00

## 2016-07-14 HISTORY — DX: Gastro-esophageal reflux disease without esophagitis: K21.9

## 2016-07-14 HISTORY — DX: Epistaxis: R04.0

## 2016-07-14 LAB — COMPREHENSIVE METABOLIC PANEL
ALBUMIN: 3.7 g/dL (ref 3.5–5.0)
ALT: 14 U/L (ref 14–54)
AST: 17 U/L (ref 15–41)
Alkaline Phosphatase: 62 U/L (ref 38–126)
Anion gap: 8 (ref 5–15)
BUN: 18 mg/dL (ref 6–20)
CHLORIDE: 106 mmol/L (ref 101–111)
CO2: 25 mmol/L (ref 22–32)
CREATININE: 0.83 mg/dL (ref 0.44–1.00)
Calcium: 8.4 mg/dL — ABNORMAL LOW (ref 8.9–10.3)
GFR calc Af Amer: >60 mL/min (ref >60)
GFR calc non Af Amer: >60 mL/min (ref >60)
Glucose, Bld: 110 mg/dL — ABNORMAL HIGH (ref 65–99)
POTASSIUM: 4.1 mmol/L (ref 3.5–5.1)
SODIUM: 139 mmol/L (ref 135–145)
Total Bilirubin: 0.2 mg/dL — ABNORMAL LOW (ref 0.3–1.2)
Total Protein: 6.4 g/dL — ABNORMAL LOW (ref 6.5–8.1)

## 2016-07-14 LAB — PULMONARY FUNCTION TEST
DL/VA % pred: 96 %
DL/VA: 4.53 ml/min/mmHg/L
DLCO unc % pred: 68 %
DLCO unc: 15.72 ml/min/mmHg
FEF 25-75 Post: 0.98 L/sec
FEF 25-75 Pre: 0.59 L/sec
FEF2575-%CHANGE-POST: 65 %
FEF2575-%PRED-POST: 42 %
FEF2575-%PRED-PRE: 25 %
FEV1-%Change-Post: 15 %
FEV1-%PRED-POST: 52 %
FEV1-%PRED-PRE: 45 %
FEV1-POST: 1.3 L
FEV1-Pre: 1.12 L
FEV1FVC-%CHANGE-POST: 8 %
FEV1FVC-%Pred-Pre: 79 %
FEV6-%CHANGE-POST: 6 %
FEV6-%PRED-POST: 62 %
FEV6-%PRED-PRE: 58 %
FEV6-Post: 1.92 L
FEV6-Pre: 1.79 L
FEV6FVC-%Change-Post: 0 %
FEV6FVC-%PRED-PRE: 102 %
FEV6FVC-%Pred-Post: 103 %
FVC-%Change-Post: 6 %
FVC-%Pred-Post: 61 %
FVC-%Pred-Pre: 57 %
FVC-POST: 1.94 L
FVC-Pre: 1.82 L
POST FEV6/FVC RATIO: 99 %
PRE FEV6/FVC RATIO: 99 %
Post FEV1/FVC ratio: 67 %
Pre FEV1/FVC ratio: 62 %

## 2016-07-14 LAB — URINALYSIS, ROUTINE W REFLEX MICROSCOPIC
BILIRUBIN URINE: NEGATIVE
Glucose, UA: NEGATIVE mg/dL
Hgb urine dipstick: NEGATIVE
KETONES UR: NEGATIVE mg/dL
LEUKOCYTES UA: NEGATIVE
Nitrite: NEGATIVE
PH: 6 (ref 5.0–8.0)
Protein, ur: NEGATIVE mg/dL
SPECIFIC GRAVITY, URINE: 1.02 (ref 1.005–1.030)

## 2016-07-14 LAB — PROTIME-INR
INR: 0.99
Prothrombin Time: 13.1 seconds (ref 11.4–15.2)

## 2016-07-14 LAB — CBC
HCT: 35.2 % — ABNORMAL LOW (ref 36.0–46.0)
Hemoglobin: 11.1 g/dL — ABNORMAL LOW (ref 12.0–15.0)
MCH: 28.1 pg (ref 26.0–34.0)
MCHC: 31.5 g/dL (ref 30.0–36.0)
MCV: 89.1 fL (ref 78.0–100.0)
Platelets: 184 10*3/uL (ref 150–400)
RBC: 3.95 MIL/uL (ref 3.87–5.11)
RDW: 14.6 % (ref 11.5–15.5)
WBC: 9.6 10*3/uL (ref 4.0–10.5)

## 2016-07-14 LAB — SURGICAL PCR SCREEN
MRSA, PCR: NEGATIVE
Staphylococcus aureus: NEGATIVE

## 2016-07-14 LAB — APTT: aPTT: 35 seconds (ref 24–36)

## 2016-07-14 MED ORDER — ALBUTEROL SULFATE (2.5 MG/3ML) 0.083% IN NEBU
2.5000 mg | INHALATION_SOLUTION | Freq: Once | RESPIRATORY_TRACT | Status: AC
  Administered 2016-07-14: 2.5 mg via RESPIRATORY_TRACT

## 2016-07-14 NOTE — Pre-Procedure Instructions (Signed)
    Lauren Bartlett  07/14/2016      [facility] - [address]ari Prows, [place] [address] [facility] [address] [place] [address] Phone: [phone] Fax: [phone]  [address] [place], [place] [address] [place] [address] Phone: [phone] Fax: [phone]    Your procedure is scheduled on   Thursday   07/17/16  Report to [facility] Admitting at 530 A.M.  Call this number if you have problems the morning of surgery:  [phone]   Remember:  Do not eat food or drink liquids after midnight.  Take these medicines the morning of surgery with A SIP OF WATER   ALBUTEROL, FAMOTIDINE (PEPCID) IF NEEDED, FLOVENT INHALER, EYE DROPS,  LEVOTHYROXINE, LORAZEPAM IF NEEDED, ANORO ELLIPTA INHALER      (STOP NOW TAKING ASPIRIN OR ASPIRIN PRODUCTS, IBUPROFEN/ ADVIL/ MOTRIN, GOODY POWDERS, BCS, HERBAL MEDICINES)   Do not wear jewelry, make-up or nail polish.  Do not wear lotions, powders, or perfumes, or deoderant.  Do not shave 48 hours prior to surgery.  Men may shave face and neck.  Do not bring valuables to the hospital.  [facility] is not responsible for any belongings or valuables.  Contacts, dentures or bridgework may not be worn into surgery.  Leave your suitcase in the car.  After surgery it may be brought to your room.  For patients admitted to the hospital, discharge time will be determined by your treatment team.  Patients discharged the day of surgery will not be allowed to drive home.   Name and phone number of your driver  Please read over the following fact sheets that you were given. MRSA Information and Surgical Site Infection Prevention

## 2016-07-14 NOTE — H&P (Signed)
[address]       [place] [address]             [phone]          CARDIOTHORACIC SURGERY HISTORY AND PHYSICAL EXAM  Referring Provider is Wellington Hampshire, MD PCP is [name], MD      Chief Complaint  Patient presents with  . Mitral Valve Prolapse    Surgical eval, Cardiac Cath and TEE 06/16/16    HPI:  Patient is a 61 year old morbidly obese white female with history of mitral regurgitation caused by mitral valve prolapse, chronic respiratory failure that had previously been attributed to COPD, essential hypertension, and anxiety and depression who has been referred for surgical consultation to discuss treatment options for management of severe symptomatically mitral regurgitation.   The patient states that she first began to experience problems with severe exertional shortness of breath 2 or 3 years ago. Symptoms progressed and she has been evaluated on numerous occasions and hospitalized on numerous occasions with acute exacerbation of chronic respiratory failure which have been previously attributed to COPD. The patient is a lifelong nonsmoker although she admits that she had been exposed to some tobacco smoking in the past. She was evaluated by [name] in December and found to have moderate COPD and some tracheomalacia. [name] felt that the patient's shortness of breath with disproportionate to her pulmonary findings. An echocardiogram was performed demonstrating mitral valve prolapse with severe mitral regurgitation. Left ventricular systolic function was normal. She was referred to [name] who first evaluated the patient on [name]. The patient subsequently underwent TEE and diagnostic cardiac catheterization on 06/16/2016. TEE confirmed the presence of mitral valve prolapse with a large flail segment involving the middle scallop of the posterior leaflet and severe mitral regurgitation.  Left ventricular function appeared normal  with ejection fraction estimated 60-65%.  Left and right heart catheterization confirmed the presence of severe mitral regurgitation. There was no significant coronary artery disease.  Left ventricular function appeared normal. There was moderate pulmonary hypertension. The patient was referred for surgical consultation.  The patient is single and lives alone in [place]. She has several family members who live nearby including her father and some nieces and nephews. She has no children.  She has been disabled for approximately 5 or 6 years secondary to problems with back pain, anxiety, and severe depression. 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 She lives a sedentary lifestyle. She still drives an automobile and she tends to the majority of her basic needs although she does have a home health aide who comes by 5 days a week to assist. She describes a several year history of worsening symptoms of shortness of breath. Over the past several months the patient has had problems with severe shortness of breath with very mild low level activities. She gets frequent congestion with problems with chronic orthopnea and occasional PND. She has been hospitalized on numerous occasions with acute exacerbations of respiratory failure. She has had problems with lower extremity edema. She denies any history of chest pain or chest tightness although she does experience "congestion" when she is more short of breath. She has not had palpitations, dizzy spells, nor syncope. She has been using oxygen at night recently and she has a chronic dry nonproductive cough with occasional wheezing. Her mobility is somewhat limited because of generalized weakness and physical deconditioning. She  walks using a rolling walker or cane for assistance.  Patient returns to the office today for follow-up of severe symptomatic primary mitral regurgitation with tentative plans to proceed with  minimally invasive mitral valve repair later this week. She was originally seen in consultation on 06/24/2016.  She reports no new problems or complaints, although she states that approximately 2 weeks ago she had increased shortness of breath and cough. She was treated as an outpatient for bronchitis with antibiotics and steroids. Symptoms have resolved.      Past Medical History:  Diagnosis Date  . Anxiety   . Bleeding from the nose   . Chronic diastolic congestive heart failure ([place])   . Congenital absence of one kidney   . COPD (chronic obstructive pulmonary disease) ([place])   . Depression    treated at [facility]  . Dyspnea   . Emphysema/COPD ([place])   . GERD (gastroesophageal reflux disease)   . Heart murmur   . History of sciatica   . Hypertension   . hypothyrodism   . Hypothyroidism   . Motion sickness    cars  . MVP (mitral valve prolapse)   . Pneumonia   . PONV (postoperative nausea and vomiting)   . Pulmonary hypertension ([place])   . Severe mitral regurgitation   . Sleep apnea    O2 at night and PRN    Past Surgical History:  Procedure Laterality Date  . ABDOMINAL HYSTERECTOMY    . BREAST BIOPSY Right 2011   [facility]< benign  . CATARACT EXTRACTION W/PHACO Left 11/14/2015   Procedure: CATARACT EXTRACTION PHACO AND INTRAOCULAR LENS PLACEMENT (IOC);  Surgeon: Leandrew Koyanagi, MD;  Location: [facility];  Service: Ophthalmology;  Laterality: Left;  sleep apnea [place]  . CATARACT EXTRACTION W/PHACO Right 12/19/2015   Procedure: CATARACT EXTRACTION PHACO AND INTRAOCULAR LENS PLACEMENT (IOC);  Surgeon: Leandrew Koyanagi, MD;  Location: [facility];  Service: Ophthalmology;  Laterality: Right;  ANXIETY GENEROUS IV SEDATION TORIC LEN  . COMBINED HYSTERECTOMY ABDOMINAL W/ A&P REPAIR / OOPHORECTOMY  1996   benign tumor  . INNER EAR SURGERY     bilateral  . RIGHT/LEFT HEART CATH AND CORONARY ANGIOGRAPHY Bilateral 06/16/2016   Procedure: Right/Left Heart Cath  and Coronary Angiography;  Surgeon: Wellington Hampshire, MD;  Location: [facility];  Service: Cardiovascular;  Laterality: Bilateral;  . TEE WITHOUT CARDIOVERSION N/A 05/19/2016   Procedure: TRANSESOPHAGEAL ECHOCARDIOGRAM (TEE);  Surgeon: Wellington Hampshire, MD;  Location: [facility];  Service: Cardiovascular;  Laterality: N/A;  . TEE WITHOUT CARDIOVERSION N/A 06/09/2016   Procedure: TRANSESOPHAGEAL ECHOCARDIOGRAM (TEE);  Surgeon: Wellington Hampshire, MD;  Location: [facility];  Service: Cardiovascular;  Laterality: N/A;  . TEE WITHOUT CARDIOVERSION N/A 06/16/2016   Procedure: TRANSESOPHAGEAL ECHOCARDIOGRAM (TEE);  Surgeon: Wellington Hampshire, MD;  Location: [facility];  Service: Cardiovascular;  Laterality: N/A;  . TONSILLECTOMY      Family History  Problem Relation Age of Onset  . Multiple sclerosis Mother   . Coronary artery disease Father   . Heart disease Brother   . Breast cancer Paternal Aunt     Social History Social History  Substance Use Topics  . Smoking status: Never Smoker  . Smokeless tobacco: Never Used  . Alcohol use No    Prior to Admission medications   Medication Sig Start Date End Date Taking? Authorizing Provider  acetaminophen (TYLENOL) 500 MG tablet Take 500 mg by mouth 2 (two) times daily as needed for moderate pain or headache.  Yes Historical Provider, MD  albuterol (PROVENTIL HFA;VENTOLIN HFA) 108 (90 Base) MCG/ACT inhaler Inhale 2 puffs into the lungs every 6 (six) hours as needed for wheezing or shortness of breath. Pt uses with SPACER.   Yes Historical Provider, MD  albuterol (PROVENTIL) (2.5 MG/3ML) 0.083% nebulizer solution Take 2.5 mg by nebulization every 6 (six) hours as needed for wheezing or shortness of breath.   Yes Historical Provider, MD  doxycycline (VIBRA-TABS) 100 MG tablet Take 1 tablet (100 mg total) by mouth 2 (two) times daily. Patient taking differently: Take 100 mg by mouth 2 (two) times daily. 7 day course started 07-04-16 should be done  07-11-16 06/23/16  Yes Laverle Hobby, MD  famotidine (PEPCID AC) 10 MG chewable tablet Chew 10 mg by mouth daily as needed for heartburn.   Yes Historical Provider, MD  fluticasone (FLONASE) 50 MCG/ACT nasal spray Place 2 sprays into both nostrils daily.   Yes Historical Provider, MD  fluticasone (FLOVENT HFA) 110 MCG/ACT inhaler Inhale 1 puff into the lungs 2 (two) times daily.   Yes Historical Provider, MD  hydrochlorothiazide (HYDRODIURIL) 25 MG tablet TAKE 1/2 TABLET(12.5 MG) BY MOUTH DAILY 10/22/15  Yes [name], MD  hydroxypropyl methylcellulose / hypromellose (ISOPTO TEARS / GONIOVISC) 2.5 % ophthalmic solution Place 1 drop into both eyes 2 (two) times daily.   Yes Historical Provider, MD  Hypromellose (ISOPTO TEARS) 0.5 % SOLN Apply 1 drop to eye 2 (two) times daily.   Yes Historical Provider, MD  ipratropium (ATROVENT) 0.03 % nasal spray USE 2 SPRAYS NASALLY THREE TIMES DAILY 06/23/16  Yes Laverle Hobby, MD  levothyroxine (SYNTHROID, LEVOTHROID) 112 MCG tablet Take 112 mcg by mouth daily before breakfast.   Yes Historical Provider, MD  loratadine-pseudoephedrine (CLARITIN-D 24-HOUR) 10-240 MG 24 hr tablet Take 1 tablet by mouth daily.   Yes Historical Provider, MD  LORazepam (ATIVAN) 0.5 MG tablet Take 1 tablet (0.5 mg total) by mouth every 8 (eight) hours as needed for anxiety. 04/18/16 04/18/17 Yes Earleen Newport, MD  losartan (COZAAR) 100 MG tablet TAKE 1 TABLET(100 MG) BY MOUTH DAILY 05/26/16  Yes [name], MD  montelukast (SINGULAIR) 10 MG tablet Take 1 tablet (10 mg total) by mouth at bedtime. 06/30/16  Yes [name], MD  polyethylene glycol powder (GLYCOLAX/MIRALAX) powder Take 17 g by mouth 2 (two) times daily as needed. Patient taking differently: Take 17 g by mouth daily as needed for mild constipation.  08/03/15  Yes [name], MD  umeclidinium-vilanterol (ANORO ELLIPTA) 62.5-25 MCG/INH AEPB Inhale 1 puff into the lungs daily. 04/28/16  Yes [name], MD  amiodarone (PACERONE) 200 MG tablet TAKE 1 TABLET BY MOUTH DAILY Patient not taking: Reported on 06/26/2016 06/24/16   Rexene Alberts, MD  benzonatate (TESSALON) 200 MG capsule Take 1 capsule (200 mg total) by mouth 3 (three) times daily as needed for cough. Patient not taking: Reported on 07/09/2016 07/04/16   [name], MD  doxycycline (VIBRA-TABS) 100 MG tablet Take 1 tablet (100 mg total) by mouth 2 (two) times daily. Patient not taking: Reported on 07/09/2016 07/04/16   [name], MD  OXYGEN Inhale 2 L into the lungs at bedtime as needed (at bedtime and as needed).    Historical Provider, MD  traMADol (ULTRAM) 50 MG tablet 1-2 tabs po q 6 hr prn pain Maximum dose= 8 tablets per day Patient not taking: Reported on 07/09/2016 11/09/15   Melynda Ripple, MD  Allergies  Allergen Reactions  . Codeine Hives and Nausea And Vomiting  . Levaquin [Levofloxacin] Nausea And Vomiting  . Penicillins Hives, Itching and Other (See Comments)    Has patient had a PCN reaction causing immediate rash, facial/tongue/throat swelling, SOB or lightheadedness with hypotension: No Has patient had a PCN reaction causing severe rash involving mucus membranes or skin necrosis: No Has patient had a PCN reaction that required hospitalization No Has patient had a PCN reaction occurring within the last 10 years: No If all of the above answers are "NO", then may proceed with Cephalosporin use.  [name] Zithromax [Azithromycin] Other (See Comments)    Reaction:  Hallucinations   . Prednisone Palpitations and Other (See Comments)    Reaction:  Hallucinations  Can take injections but not oral meds      Review of Systems:              General:                      normal appetite, decreased energy, + weight gain, no weight loss, no fever             Cardiac:                       no chest pain with exertion, no chest pain at rest, + SOB with exertion, occasional resting SOB, + PND, + orthopnea, no  palpitations, no arrhythmia, no atrial fibrillation, + LE edema, no dizzy spells, no syncope             Respiratory:                 + shortness of breath, + home oxygen, no productive cough, + dry cough, no bronchitis, + wheezing, no hemoptysis, no asthma, no pain with inspiration or cough, + sleep apnea, no CPAP at night             GI:                               no difficulty swallowing, no reflux, no frequent heartburn, no hiatal hernia, no abdominal pain, no constipation, no diarrhea, no hematochezia, no hematemesis, no melena             GU:                              no dysuria,  no frequency, no urinary tract infection, no hematuria, no kidney stones, no kidney disease but h/o congenital solitary kidney             Vascular:                     no pain suggestive of claudication, no pain in feet, no leg cramps, no varicose veins, no DVT, no non-healing foot ulcer             Neuro:                         no stroke, no TIA's, no seizures, no headaches, no temporary blindness one eye,  no slurred speech, no peripheral neuropathy, + chronic pain, + instability of gait, no memory/cognitive dysfunction             Musculoskeletal:         +  arthritis, no joint swelling, no myalgias, some difficulty walking, decreased mobility              Skin:                            no rash, no itching, no skin infections, no pressure sores or ulcerations             Psych:                         + anxiety, + depression, no nervousness, no unusual recent stress             Eyes:                           no blurry vision, no floaters, no recent vision changes, does not wear glasses or contacts             ENT:                            + hearing loss, no loose or painful teeth, no dentures, last saw dentist 5 months ago             Hematologic:               + easy bruising, no abnormal bleeding, no clotting disorder, + frequent epistaxis             Endocrine:                   no diabetes, does not  check CBG's at home                           Physical Exam:              BP 128/76   Pulse 92   Resp 20   Ht 5\' 2"  (1.575 m)   Wt 218 lb (98.9 kg)   SpO2 94% Comment: RA  BMI 39.87 kg/m              General:                      Morbidly obese, o/w  well-appearing             HEENT:                       Unremarkable              Neck:                           no JVD, no bruits, no adenopathy              Chest:                          clear to auscultation, symmetrical breath sounds, no wheezes, no rhonchi              CV:                              RRR, grade III/VI holosystolic murmur  Abdomen:                    soft, non-tender, no masses              Extremities:                 warm, well-perfused, pulses not palpable, mild LE edema             Rectal/GU                   Deferred             Neuro:                         Grossly non-focal and symmetrical throughout             Skin:                            Clean and dry, no rashes, no breakdown   Diagnostic Tests:  Transthoracic Echocardiography  Patient: Jasey, Cortez MR #: [id number] Study Date: 04/15/2016 Gender: F Age: 55 Height: 152.4 cm Weight: 96.6 kg BSA: 2.08 m^2 Pt. Status: Room:  ATTENDING Liberty Handy REFERRING [place], [place], [place] SONOGRAPHER Pilar Jarvis, RVT, RDCS, RDMS  cc:  ------------------------------------------------------------------- LV EF: 55% - 60%  ------------------------------------------------------------------- History: PMH: OSA.  Dyspnea that has progressed over the past year. Mitral valve disease. Chronic obstructive pulmonary disease.  ------------------------------------------------------------------- Study Conclusions  - Left ventricle: The cavity size was mildly dilated. There was mild concentric hypertrophy. Systolic  function was normal. The estimated ejection fraction was in the range of 55% to 60%. Wall motion was normal; there were no regional wall motion abnormalities. Features are consistent with a pseudonormal left ventricular filling pattern, with concomitant abnormal relaxation and increased filling pressure (grade 2 diastolic dysfunction). - Mitral valve: Calcified annulus. Severe prolapse, involving the posterior leaflet. There was severe regurgitation directed eccentrically and anteriorly. - Left atrium: The atrium was moderately dilated. - Pulmonary arteries: Systolic pressure was moderately to severely increased. PA peak pressure: 60 mm Hg (S).  Impressions:  - Recommend cardiac evaluation and consider TEE to evaluate mitral valve.  ------------------------------------------------------------------- Study data: The previous study was not available, so comparison was made to the report of May 2017. Study status: Routine. Procedure: Transthoracic echocardiography. Image quality was good. Study completion: There were no complications. Transthoracic echocardiography. M-mode, complete 2D, spectral Doppler, and color Doppler. Birthdate: Patient birthdate: 1955-12-02. Age: Patient is 61 yr old. Sex: Gender: female. BMI: 41.6 kg/m^2. Blood pressure: 100/60 Patient status: Outpatient. Study date: Study date: 04/15/2016. Study time: 09:49 AM.  -------------------------------------------------------------------  ------------------------------------------------------------------- Left ventricle: The cavity size was mildly dilated. There was mild concentric hypertrophy. Systolic function was normal. The estimated ejection fraction was in the range of 55% to 60%. Wall motion was normal; there were no regional wall motion abnormalities. Features are consistent with a pseudonormal left ventricular filling pattern, with concomitant abnormal relaxation  and increased filling pressure (grade 2 diastolic dysfunction).  ------------------------------------------------------------------- Aortic valve: Trileaflet; normal thickness, mildly calcified leaflets. Mobility was not restricted. Doppler: Transvalvular velocity was within the normal range. There was no stenosis. There was no regurgitation.  ------------------------------------------------------------------- Aorta: Aortic root: The aortic root was normal in size.  ------------------------------------------------------------------- Mitral valve: Calcified annulus. Mobility was not restricted. Severe prolapse, involving the posterior leaflet. Doppler: Transvalvular velocity  was within the normal range. There was no evidence for stenosis. There was severe regurgitation directed eccentrically and anteriorly. Valve area by continuity equation (using LVOT flow): 1.42 cm^2. Indexed valve area by continuity equation (using LVOT flow): 0.68 cm^2/m^2. Mean gradient (D): 6 mm Hg. Peak gradient (D): 13 mm Hg.  ------------------------------------------------------------------- Left atrium: The atrium was moderately dilated.  ------------------------------------------------------------------- Right ventricle: The cavity size was normal. Wall thickness was normal. Systolic function was normal.  ------------------------------------------------------------------- Pulmonic valve: Doppler: Transvalvular velocity was within the normal range. There was no evidence for stenosis.  ------------------------------------------------------------------- Tricuspid valve: Structurally normal valve. Doppler: Transvalvular velocity was within the normal range. There was mild regurgitation.  ------------------------------------------------------------------- Pulmonary artery: The main pulmonary artery was normal-sized. Systolic pressure was moderately to severely  increased.  ------------------------------------------------------------------- Right atrium: The atrium was normal in size.  ------------------------------------------------------------------- Pericardium: There was no pericardial effusion.  ------------------------------------------------------------------- Systemic veins: Inferior vena cava: The vessel was normal in size.  ------------------------------------------------------------------- Measurements  Left ventricle Value Reference LV ID, ED, PLAX chordal (H) 52.9 mm 43 - 52 LV ID, ES, PLAX chordal 33.7 mm 23 - 38 LV fx shortening, PLAX chordal 36 % >=29 LV PW thickness, ED 12.1 mm ---------- IVS/LV PW ratio, ED 1.02 <=1.3 Stroke volume, 2D 70 ml ---------- Stroke volume/bsa, 2D 34 ml/m^2 ---------- LV ejection fraction, 1-p A4C 63 % ---------- LV e&', lateral 8.81 cm/s ---------- LV E/e&', lateral 20.2 ---------- LV e&', medial 8.05 cm/s ---------- LV E/e&', medial 22.11 ---------- LV e&', average 8.43 cm/s ---------- LV E/e&', average 21.12 ----------  Ventricular septum Value Reference IVS thickness, ED 12.3 mm ----------  LVOT Value Reference LVOT ID, S 20 mm ---------- LVOT area 3.14 cm^2 ---------- LVOT ID 20 mm ---------- LVOT peak velocity, S  112 cm/s ---------- LVOT mean velocity, S 72.9 cm/s ---------- LVOT VTI, S 22.3 cm ---------- LVOT peak gradient, S 5 mm Hg ---------- Stroke volume (SV), LVOT DP 70.1 ml ---------- Stroke index (SV/bsa), LVOT DP 33.7 ml/m^2 ----------  Aorta Value Reference Aortic root ID, ED 31 mm ---------- Ascending aorta ID, A-P, S 27 mm ----------  Left atrium Value Reference LA ID, A-P, ES 41 mm ---------- LA ID/bsa, A-P 1.97 cm/m^2 <=2.2 LA volume, S 74.6 ml ---------- LA volume/bsa, S 35.9 ml/m^2 ---------- LA volume, ES, 1-p A4C 61.2 ml ---------- LA volume/bsa, ES, 1-p A4C 29.4 ml/m^2 ---------- LA volume, ES, 1-p A2C 93.5 ml ---------- LA volume/bsa, ES, 1-p A2C 45 ml/m^2 ----------  Mitral valve Value Reference Mitral E-wave peak velocity 178 cm/s ---------- Mitral A-wave peak velocity 111 cm/s ---------- Mitral mean velocity, D 104 cm/s ---------- Mitral deceleration time 180 ms 150 - 230 Mitral mean gradient, D 6 mm Hg ---------- Mitral peak gradient, D 13 mm Hg ---------- Mitral E/A ratio, peak 3.2 ---------- Mitral valve area, LVOT 1.42 cm^2 ---------- continuity Mitral valve area/bsa, LVOT 0.68 cm^2/m^2 ---------- continuity Mitral  annulus VTI, D 49.3 cm ----------  Pulmonary arteries Value Reference PA pressure, S, DP (H) 60 mm Hg <=30  Tricuspid valve Value Reference Tricuspid regurg peak velocity 391 cm/s ---------- Tricuspid peak RV-RA gradient 61 mm Hg ----------  Right atrium Value Reference RA ID, S-I, ES, A4C 47.6 mm 34 - 49 RA area, ES, A4C 17 cm^2 8.3 - 19.5 RA volume, ES, A/L 50.6 ml ---------- RA volume/bsa, ES, A/L 24.3 ml/m^2 ----------  Right ventricle Value Reference TAPSE 32.5 mm ---------- RV s&', lateral, S 17.1 cm/s ----------  Pulmonic valve Value Reference Pulmonic valve peak  velocity, S 99.4 cm/s ----------  Legend: (L) and (H) mark values outside specified reference range.  ------------------------------------------------------------------- Prepared and Electronically Authenticated by  Kathlyn Sacramento, MD 2018-01-16T17:18:16    Transesophageal Echocardiography  Patient: Lucendia, Leard MR #: [id number] Study Date: 06/16/2016 Gender: F Age: 61 Height: Weight: BSA: Pt. Status: Room:  ADMITTING Kathlyn Sacramento, MD ATTENDING Kathlyn Sacramento, MD ORDERING Kathlyn Sacramento, MD REFERRING Kathlyn Sacramento, MD SONOGRAPHER Sherrie Sport RDCS PERFORMING [facility]  cc:  ------------------------------------------------------------------- LV EF: 60% - 65%  ------------------------------------------------------------------- Indications: Mitral valve disorder  424.0.  ------------------------------------------------------------------- Study Conclusions  - Left ventricle: Systolic function was normal. The estimated ejection fraction was in the range of 60% to 65%. Wall motion was normal; there were no regional wall motion abnormalities. - Mitral valve: Severe prolapse, involving the posterior leaflet. No evidence of vegetation. There was severe regurgitation directed anteriorly. - Atrial septum: No defect or patent foramen ovale was identified. Echo contrast study showed no right-to-left atrial level shunt, following an increase in RA pressure induced by provocative maneuvers. - Tricuspid valve: There was mild regurgitation. - Pulmonary arteries: Systolic pressure was moderately increased. PA peak pressure: 50 mm Hg (S).  ------------------------------------------------------------------- Study data: Procedure: Initial setup. The patient was brought to the laboratory. Surface ECG leads were monitored. Sedation. Conscious sedation was administered. Transesophageal echocardiography. Topical anesthesia was obtained using viscous lidocaine. A transesophageal probe was inserted by the attending cardiologist. Image quality was adequate. Study completion: The patient tolerated the procedure well. There were no complications. Diagnostic transesophageal echocardiography. 2D and color Doppler. Birthdate: Patient birthdate: 1956-02-10. Age: Patient is 61 yr old. Sex: Gender: female. Study date: Study date: 06/16/2016. Study time: 07:44 AM.  -------------------------------------------------------------------  ------------------------------------------------------------------- Left ventricle: Systolic function was normal. The estimated ejection fraction was in the range of 60% to 65%. Wall motion was normal; there were no regional wall motion  abnormalities.  ------------------------------------------------------------------- Aortic valve: Structurally normal valve. Trileaflet; normal thickness leaflets. Cusp separation was normal. No evidence of vegetation. Doppler: There was no regurgitation.  ------------------------------------------------------------------- Aorta: There was no atheroma. There was no evidence for dissection. Aortic root: The aortic root was not dilated. Ascending aorta: The ascending aorta was normal in size. Aortic arch: The aortic arch was normal in size. Descending aorta: The descending aorta was normal in size.  ------------------------------------------------------------------- Mitral valve: Leaflet separation was normal. Severe prolapse, involving the posterior leaflet. No evidence of vegetation. Doppler: There was severe regurgitation directed anteriorly.  ------------------------------------------------------------------- Left atrium: The atrium was normal in size. No evidence of thrombus in the atrial cavity or appendage. The appendage was morphologically a left appendage, multilobulated, and of normal size. Emptying velocity was normal.  ------------------------------------------------------------------- Atrial septum: No defect or patent foramen ovale was identified. Echo contrast study showed no right-to-left atrial level shunt, following an increase in RA pressure induced by provocative maneuvers.  ------------------------------------------------------------------- Right ventricle: The cavity size was normal. Wall thickness was normal. Systolic function was normal.  ------------------------------------------------------------------- Pulmonic valve: Structurally normal valve. Cusp separation was normal. No evidence of vegetation.  ------------------------------------------------------------------- Tricuspid valve: Structurally normal valve. Leaflet  separation was normal. Doppler: There was mild regurgitation.  ------------------------------------------------------------------- Pulmonary artery: The main pulmonary artery was normal-sized. Systolic pressure was moderately increased.  ------------------------------------------------------------------- Right atrium: The atrium was normal in size. No evidence of thrombus in the atrial cavity or appendage. The appendage was morphologically a right appendage.  ------------------------------------------------------------------- Pericardium: There was no pericardial effusion.  ------------------------------------------------------------------- Measurements  Pulmonary arteries Value Reference PA pressure, S, DP (H) 50 mm  Hg <=30  Legend: (L) and (H) mark values outside specified reference range.  ------------------------------------------------------------------- Prepared and Electronically Authenticated by  Kathlyn Sacramento, MD 2018-03-19T11:59:12    Right/Left Heart Cath and Coronary Angiography  Conclusion     The left ventricular systolic function is normal.  LV end diastolic pressure is normal.  The left ventricular ejection fraction is 55-65% by visual estimate.  There is severe (4+) mitral regurgitation.  1. Normal coronary arteries. Catheter-induced spasm in the ostial right coronary artery with no significant pressure dampening. This improved with catheter pullback. 2. Normal LV systolic function. 3. Severe mitral regurgitation by left ventricular angiography with dilated left atrium. 4. Right heart catheterization showed mildly elevated filling pressures, prominent V wave on pulmonary capillary wedge pressure, moderate pulmonary hypertension and high cardiac output. RA pressure: 6 mmHg, RV pressure: 53/7, PA pressure: 52/15 with a mean of 35 mmHg, pulmonary capillary wedge pressure: 17 mmHg. Cardiac output was 6.9  with a cardiac index of 3.5  Recommendations: Recommend evaluation for mitral valve repair. The patient needs to see pulmonary in order to make sure she is a candidate.   Indications   Mitral valve disease [I05.9 (ICD-10-CM)]  Procedural Details/Technique   Technical Details Procedural Details: The pre-existing IV in the right antecubital vein was exchanged under sterile fashion to a slender sheath. Right heart catheterization was performed using a 5 French Swan-Ganz catheter. Cardiac output was calculated by the Fick method.  The right wrist was prepped, draped, and anesthetized with 1% lidocaine. Using the modified Seldinger technique, a 5 French sheath was introduced into the right radial artery. 2.5 mg of verapamil was administered through the sheath, weight-based unfractionated heparin was administered intravenously.A Jackie catheter was used for selective coronary angiography. A pigtail catheter was used for left ventriculography. Catheter exchanges were performed over an exchange length guidewire. There were no immediate procedural complications. A TR band was used for radial hemostasis at the completion of the procedure. The patient was transferred to the post catheterization recovery area for further monitoring.   Estimated blood loss <50 mL.  During this procedure the patient was administered the following to achieve and maintain moderate conscious sedation: Versed 1 mg, Fentanyl 50 mcg, while the patient's heart rate, blood pressure, and oxygen saturation were continuously monitored. The period of conscious sedation was 32 minutes, of which I was present face-to-face 100% of this time.    Coronary Findings   Dominance: Right  Left Main  Vessel is angiographically normal.  Left Anterior Descending  Vessel is angiographically normal.  First Diagonal Branch  Vessel is angiographically normal.  Second Diagonal Branch  Vessel is small in size. Vessel is angiographically normal.   Third Diagonal Branch  Vessel is small in size. Vessel is angiographically normal.  Left Circumflex  Vessel is angiographically normal.  First Obtuse Marginal Branch  Vessel is small in size. Vessel is angiographically normal.  Second Obtuse Marginal Branch  Vessel is small in size. Vessel is angiographically normal.  Third Obtuse Marginal Branch  Vessel is angiographically normal.  Right Coronary Artery  Vessel is angiographically normal.  Right Posterior Descending Artery  Vessel is angiographically normal.  Right Posterior Atrioventricular Branch  Vessel is angiographically normal.  First Right Posterolateral  Vessel is angiographically normal.  Second Right Posterolateral  Vessel is angiographically normal.  Third Right Posterolateral  Vessel is angiographically normal.  Wall Motion              Left Heart   Left Ventricle The  left ventricular size is normal. The left ventricular systolic function is normal. LV end diastolic pressure is normal. The left ventricular ejection fraction is 55-65% by visual estimate. No regional wall motion abnormalities. There is severe (4+) mitral regurgitation.    Coronary Diagrams   Diagnostic Diagram       Implants        No implant documentation for this case.  PACS Images   Show images for Cardiac catheterization   Link to Procedure Log   Procedure Log    Hemo Data   AO Systolic Cath Pressure AO Diastolic Cath Pressure AO Mean Cath Pressure LV Systolic Cath Pressure LV End Diastolic PA Systolic Cath Pressure PA Diastolic Cath Pressure PA Mean Cath Pressure RV Systolic Cath Pressure RV Diastolic Cath Pressure RV End Diastolic AO O2 Sat PA O2 Sat AO O2 Sat  -- -- -- -- -- -- -- -- 53 mmHg 7 mmHg 13 mmHg -- -- --  -- -- -- -- -- 47 mmHg 14 mmHg 33 mmHg -- -- -- -- -- --  146 82 mmHg 111 mmHg -- -- -- -- -- -- -- -- -- -- --  140 78 mmHg 105 mmHg -- -- -- -- -- -- -- -- -- -- --  135 74 mmHg 99 mmHg -- -- -- --  -- -- -- -- -- -- --  138 70 mmHg 82 mmHg -- -- -- -- -- -- -- -- -- -- --  -- -- -- 123 mmHg 24 mmHg -- -- -- -- -- -- -- -- --  -- -- -- 139 mmHg 21 mmHg -- -- -- -- -- -- -- -- --  -- -- -- 137 mmHg 22 mmHg -- -- -- -- -- -- -- -- --  150 85 mmHg 113 mmHg -- -- -- -- -- -- -- -- -- -- --  -- -- -- -- -- -- -- -- -- -- -- 99.1 % -- SA  -- -- --               CT ANGIOGRAPHY CHEST, ABDOMEN AND PELVIS  TECHNIQUE: Multidetector CT imaging through the chest, abdomen and pelvis was performed using the standard protocol during bolus administration of intravenous contrast. Multiplanar reconstructed images and MIPs were obtained and reviewed to evaluate the vascular anatomy.  CONTRAST: 100 mL Isovue 370  COMPARISON: None.  FINDINGS: CTA CHEST FINDINGS  Cardiovascular: Aberrant right subclavian artery without aneurysm at the origin. The right subclavian artery is posterior to the esophagus and distal to the left subclavian artery. Great vessels are patent. Normal caliber of the thoracic aorta without dissection. Minimal atherosclerotic disease of the aortic arch. Overall, no significant atherosclerotic disease in the chest. Main pulmonary arteries appear to be patent. Enlargement of the main pulmonary artery measuring up to 3.5 cm. Enlargement of the left atrium compatible with mitral valve disease.  Mediastinum/Nodes: No lymph node enlargement in the mediastinum, hila or axillary regions.  Lungs/Pleura: No pleural effusions. Lungs are clear without airspace disease or consolidation.  Musculoskeletal: Dextroscoliosis of the thoracic spine.  Review of the MIP images confirms the above findings.  CTA ABDOMEN AND PELVIS FINDINGS  VASCULAR  Aorta: The abdominal aorta is mildly tortuous related to the scoliosis. Normal caliber of the abdominal aorta without aneurysm or dissection. Minimal calcified plaque in the distal abdominal aorta.  Celiac:  Limited evaluation due to motion artifact. Celiac trunk is widely patent. Main branches appear to be patent.  SMA: SMA is patent without significant  plaque or stenosis.  Renals: Solitary right kidney. Right renal artery is patent without significant plaque or stenosis.  IMA: IMA is patent.  Inflow: Iliac arteries are mildly tortuous without stenosis. Small amount of calcified plaque in the right common iliac artery and right internal iliac artery. Proximal femoral arteries are patent.  Veins: No obvious venous abnormality within the limitations of this arterial phase study.  Review of the MIP images confirms the above findings.  NON-VASCULAR  Hepatobiliary: Normal appearance of the liver and gallbladder.  Pancreas: Normal appearance of the pancreas without inflammation or duct dilatation.  Spleen: Normal appearance of spleen without enlargement.  Adrenals/Urinary Tract: Bilateral adrenal glands appear normal. Solitary right kidney. Large amount of motion artifact limits evaluation of the right kidney but no gross abnormality to the right kidney. Negative for hydronephrosis. Absent left kidney appears to be congenital. Normal appearance of the urinary bladder.  Stomach/Bowel: Small hiatal hernia. Scattered colonic diverticula without acute inflammatory changes. No evidence for bowel obstruction or bowel dilatation.  Lymphatic: No significant lymph node enlargement in the abdomen or pelvis.  Reproductive: Status post hysterectomy. No adnexal masses.  Other: No free fluid. Right inguinal hernia containing fat. Small ventral hernia containing fat near the umbilicus.  Musculoskeletal: Levoscoliosis in the lumbar spine.  Review of the MIP images confirms the above findings.  IMPRESSION: Minimal atherosclerotic disease in the chest, abdomen and pelvis. No significant arterial stenosis.  Tortuosity of the aorta and iliac arteries related to  scoliosis.  Aberrant right subclavian artery.  Solitary right kidney. 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   Electronically Signed By: Markus Daft M.D. On: 07/08/2016 13:37    Impression:  Patient has stage D severe symptomatic primary mitral regurgitation. She presents with a long history of shortness of breath with frequent intermittent episodes of acute exacerbation of chronic respiratory failureconsistent with acute on chronic diastolic congestive heart failure, [place] Heart Association functional class IIIB-IV. Symptoms are likely multifactorial and related to the patient's numerous other medical problems including morbid obesity, obstructive sleep apnea, and COPD, but I agree that the patient's mitral regurgitation and congestive heart failure may be her biggest problem. I have personally reviewed the patient's recent transthoracic and transesophageal echocardiograms as well as her diagnostic cardiac catheterization. Echocardiograms confirmed the presence of myxomatous degenerative disease of the mitral valve with severe prolapse with multiple ruptured chordae tendineae involving the middle scallop of the posterior leaflet causingsevere mitral regurgitation. Left ventricular systolic function remains preserved. Diagnostic cardiac catheterization is notable for the absence of significant coronary artery disease butmoderate pulmonary hypertension. I agree the patient needs elective mitral valve repair. Risks associated withsurgery should be reasonably low but elevated to some degree because of the patient's comorbid medical problems and significant physical deconditioning.  CT angiography reveals no contraindication to peripheral arterial cannulation for surgery. The patient does have an abberant right subclavian artery.    Plan:  I have again reviewed the indications, risks, and potential benefits of surgery with the patient and her close  friend in the office today. Expectations for her postoperative convalescence at been discussed at length, and the patient specifically plans for anticipated discharged to a local skilled nursing facility for rehabilitation when she is medically stable.  The patient understands and accepts all potential risks of surgery including but not limited to risk of death, stroke or other neurologic complication, myocardial infarction, congestive heart failure, respiratory failure, renal failure, bleeding requiring transfusion and/or reexploration, arrhythmia, infection or other  wound complications, pneumonia, pleural and/or pericardial effusion, pulmonary embolus, aortic dissection or other major vascular complication, or delayed complications related to valve repair or replacement including but not limited to structural valve deterioration and failure, thrombosis, embolization, endocarditis, or paravalvular leak. Alternative surgical approaches have been discussed including a comparison between conventional sternotomy and minimally-invasive techniques. The relative risks and benefits of each have been reviewed as they pertain to the patient's specific circumstances, and all of their questions have been addressed. Specific risks potentially related to the minimally-invasive approach were discussed at length, including but not limited to risk of conversion to full or partial sternotomy, aortic dissection or other major vascular complication, unilateral acute lung injury or pulmonary edema, phrenic nerve dysfunction or paralysis, rib fracture, chronic pain, lung hernia, or lymphocele. All of their questions have been answered.   I spent in excess of 15 minutes during the conduct of this office consultation and >50% of this time involved direct face-to-face encounter with the patient for counseling and/or coordination of their care.    Valentina Gu. Roxy Manns, MD 07/14/2016 11:27 AM

## 2016-07-14 NOTE — Progress Notes (Signed)
[address]       [place] [address]             [phone]     CARDIOTHORACIC SURGERY OFFICE NOTE  Referring Provider is Wellington Hampshire, MD PCP is [name], MD   HPI:  Patient returns to the office today for follow-up of severe symptomatic primary mitral regurgitation with tentative plans to proceed with minimally invasive mitral valve repair later this week. She was originally seen in consultation on 06/24/2016.  She reports no new problems or complaints, although she states that approximately 2 weeks ago she had increased shortness of breath and cough. She was treated as an outpatient for bronchitis with antibiotics and steroids. Symptoms have resolved.   Current Outpatient Prescriptions  Medication Sig Dispense Refill  . acetaminophen (TYLENOL) 500 MG tablet Take 500 mg by mouth 2 (two) times daily as needed for moderate pain or headache.    . albuterol (PROVENTIL HFA;VENTOLIN HFA) 108 (90 Base) MCG/ACT inhaler Inhale 2 puffs into the lungs every 6 (six) hours as needed for wheezing or shortness of breath. Pt uses with SPACER.    [name] albuterol (PROVENTIL) (2.5 MG/3ML) 0.083% nebulizer solution Take 2.5 mg by nebulization every 6 (six) hours as needed for wheezing or shortness of breath.    [name] amiodarone (PACERONE) 200 MG tablet TAKE 1 TABLET BY MOUTH DAILY (Patient not taking: Reported on 06/26/2016) 90 tablet 0  . benzonatate (TESSALON) 200 MG capsule Take 1 capsule (200 mg total) by mouth 3 (three) times daily as needed for cough. (Patient not taking: Reported on 07/09/2016) 60 capsule 1  . doxycycline (VIBRA-TABS) 100 MG tablet Take 1 tablet (100 mg total) by mouth 2 (two) times daily. (Patient taking differently: Take 100 mg by mouth 2 (two) times daily. 7 day course started 07-04-16 should be done 07-11-16) 14 tablet 0  . doxycycline (VIBRA-TABS) 100 MG tablet Take 1 tablet (100 mg total) by mouth 2 (two) times daily. (Patient not taking: Reported on  07/09/2016) 14 tablet 0  . famotidine (PEPCID AC) 10 MG chewable tablet Chew 10 mg by mouth daily as needed for heartburn.    . fluticasone (FLONASE) 50 MCG/ACT nasal spray Place 2 sprays into both nostrils daily.    . fluticasone (FLOVENT HFA) 110 MCG/ACT inhaler Inhale 1 puff into the lungs 2 (two) times daily.    . hydrochlorothiazide (HYDRODIURIL) 25 MG tablet TAKE 1/2 TABLET(12.5 MG) BY MOUTH DAILY 45 tablet 1  . hydroxypropyl methylcellulose / hypromellose (ISOPTO TEARS / GONIOVISC) 2.5 % ophthalmic solution Place 1 drop into both eyes 2 (two) times daily.    . Hypromellose (ISOPTO TEARS) 0.5 % SOLN Apply 1 drop to eye 2 (two) times daily.    [name] ipratropium (ATROVENT) 0.03 % nasal spray USE 2 SPRAYS NASALLY THREE TIMES DAILY 90 mL 1  . levothyroxine (SYNTHROID, LEVOTHROID) 112 MCG tablet Take 112 mcg by mouth daily before breakfast.    . loratadine-pseudoephedrine (CLARITIN-D 24-HOUR) 10-240 MG 24 hr tablet Take 1 tablet by mouth daily.    [name] LORazepam (ATIVAN) 0.5 MG tablet Take 1 tablet (0.5 mg total) by mouth every 8 (eight) hours as needed for anxiety. 30 tablet 0  . losartan (COZAAR) 100 MG tablet TAKE 1 TABLET(100 MG) BY MOUTH DAILY 30 tablet 5  . montelukast (SINGULAIR) 10 MG tablet Take 1 tablet (10 mg total) by mouth at bedtime. 30 tablet 3  . OXYGEN Inhale 2 L into the  lungs at bedtime as needed (at bedtime and as needed).    . polyethylene glycol powder (GLYCOLAX/MIRALAX) powder Take 17 g by mouth 2 (two) times daily as needed. (Patient taking differently: Take 17 g by mouth daily as needed for mild constipation. ) 3350 g 1  . traMADol (ULTRAM) 50 MG tablet 1-2 tabs po q 6 hr prn pain Maximum dose= 8 tablets per day (Patient not taking: Reported on 07/09/2016) 20 tablet 0  . umeclidinium-vilanterol (ANORO ELLIPTA) 62.5-25 MCG/INH AEPB Inhale 1 puff into the lungs daily. 1 each 11   No current facility-administered medications for this visit.       Physical Exam:   BP (!) 150/89    Pulse 92   Resp 20   Ht 5\' 2"  (1.575 m)   Wt 218 lb (98.9 kg)   SpO2 95% Comment: RA  BMI 39.87 kg/m   General:  Chronically ill-appearing  Chest:   Clear to auscultation with symmetrical breath sounds  CV:   Regular rate and rhythm with prominent holosystolic murmur  Incisions:  n/a  Abdomen:  Soft nontender  Extremities:  Warm and well-perfused, no edema  Diagnostic Tests:  CT ANGIOGRAPHY CHEST, ABDOMEN AND PELVIS  TECHNIQUE: Multidetector CT imaging through the chest, abdomen and pelvis was performed using the standard protocol during bolus administration of intravenous contrast. Multiplanar reconstructed images and MIPs were obtained and reviewed to evaluate the vascular anatomy.  CONTRAST:  100 mL Isovue 370  COMPARISON:  None.  FINDINGS: CTA CHEST FINDINGS  Cardiovascular: Aberrant right subclavian artery without aneurysm at the origin. The right subclavian artery is posterior to the esophagus and distal to the left subclavian artery. Great vessels are patent. Normal caliber of the thoracic aorta without dissection. Minimal atherosclerotic disease of the aortic arch. Overall, no significant atherosclerotic disease in the chest. Main pulmonary arteries appear to be patent. Enlargement of the main pulmonary artery measuring up to 3.5 cm. Enlargement of the left atrium compatible with mitral valve disease.  Mediastinum/Nodes: No lymph node enlargement in the mediastinum, hila or axillary regions.  Lungs/Pleura: No pleural effusions. Lungs are clear without airspace disease or consolidation.  Musculoskeletal: Dextroscoliosis of the thoracic spine.  Review of the MIP images confirms the above findings.  CTA ABDOMEN AND PELVIS FINDINGS  VASCULAR  Aorta: The abdominal aorta is mildly tortuous related to the scoliosis. Normal caliber of the abdominal aorta without aneurysm or dissection. Minimal calcified plaque in the distal abdominal  aorta.  Celiac: Limited evaluation due to motion artifact. Celiac trunk is widely patent. Main branches appear to be patent.  SMA: SMA is patent without significant plaque or stenosis.  Renals: Solitary right kidney. Right renal artery is patent without significant plaque or stenosis.  IMA: IMA is patent.  Inflow: Iliac arteries are mildly tortuous without stenosis. Small amount of calcified plaque in the right common iliac artery and right internal iliac artery. Proximal femoral arteries are patent.  Veins: No obvious venous abnormality within the limitations of this arterial phase study.  Review of the MIP images confirms the above findings.  NON-VASCULAR  Hepatobiliary: Normal appearance of the liver and gallbladder.  Pancreas: Normal appearance of the pancreas without inflammation or duct dilatation.  Spleen: Normal appearance of spleen without enlargement.  Adrenals/Urinary Tract: Bilateral adrenal glands appear normal. Solitary right kidney. Large amount of motion artifact limits evaluation of the right kidney but no gross abnormality to the right kidney. Negative for hydronephrosis. Absent left kidney appears to be congenital. Normal appearance  of the urinary bladder.  Stomach/Bowel: Small hiatal hernia. Scattered colonic diverticula without acute inflammatory changes. No evidence for bowel obstruction or bowel dilatation.  Lymphatic: No significant lymph node enlargement in the abdomen or pelvis.  Reproductive: Status post hysterectomy. No adnexal masses.  Other: No free fluid. Right inguinal hernia containing fat. Small ventral hernia containing fat near the umbilicus.  Musculoskeletal: Levoscoliosis in the lumbar spine.  Review of the MIP images confirms the above findings.  IMPRESSION: Minimal atherosclerotic disease in the chest, abdomen and pelvis. No significant arterial stenosis.  Tortuosity of the aorta and iliac arteries  related to scoliosis.  Aberrant right subclavian artery.  Solitary right kidney. 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   Electronically Signed   By: Markus Daft M.D.   On: 07/08/2016 13:37    Impression:  Patient has stage D severe symptomatic primary mitral regurgitation. She presents with a long history of shortness of breath with frequent intermittent episodes of acute exacerbation of chronic respiratory failure consistent with acute on chronic diastolic congestive heart failure, [place] Heart Association functional class IIIB-IV.  Symptoms are likely multifactorial and related to the patient's numerous other medical problems including morbid obesity, obstructive sleep apnea, and COPD, but I agree that the patient's mitral regurgitation and congestive heart failure may be her biggest problem.  I have personally reviewed the patient's recent transthoracic and transesophageal echocardiograms as well as her diagnostic cardiac catheterization. 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  Left ventricular systolic function remains preserved.  Diagnostic cardiac catheterization is notable for the absence of significant coronary artery disease but moderate pulmonary hypertension. I agree the patient needs elective mitral valve repair.  Risks associated with surgery should be reasonably low but elevated to some degree because of the patient's comorbid medical problems and significant physical deconditioning.  CT angiography reveals no contraindication to peripheral arterial cannulation for surgery. The patient does have an abberant right subclavian artery.    Plan:  I have again reviewed the indications, risks, and potential benefits of surgery with the patient and her  close friend in the office today. Expectations for her postoperative convalescence at been discussed at length, and the patient specifically plans for anticipated discharged to a local skilled nursing facility for rehabilitation when she is medically stable.  The patient understands and accepts all potential risks of surgery including but not limited to risk of death, stroke or other neurologic complication, myocardial infarction, congestive heart failure, respiratory failure, renal failure, bleeding requiring transfusion and/or reexploration, arrhythmia, infection or other wound complications, pneumonia, pleural and/or pericardial effusion, pulmonary embolus, aortic dissection or other major vascular complication, or delayed complications related to valve repair or replacement including but not limited to structural valve deterioration and failure, thrombosis, embolization, endocarditis, or paravalvular leak.  Alternative surgical approaches have been discussed including a comparison between conventional sternotomy and minimally-invasive techniques.  The relative risks and benefits of each have been reviewed as they pertain to the patient's specific circumstances, and all of their questions have been addressed.  Specific risks potentially related to the minimally-invasive approach were discussed at length, including but not limited to risk of conversion to full or partial sternotomy, aortic dissection or other major vascular complication, unilateral acute lung injury or pulmonary edema, phrenic nerve dysfunction or paralysis, rib fracture, chronic pain, lung hernia, or  lymphocele.  All of their questions have been answered.   I spent in excess of 15 minutes during the conduct of this office consultation and >50% of this time involved direct face-to-face encounter with the patient for counseling and/or coordination of their care.    Valentina Gu. Roxy Manns, MD 07/14/2016 11:27 AM

## 2016-07-14 NOTE — Progress Notes (Signed)
Per Short Stay Lab "Gerilyn Nestle" she is having problems w/ pt info crossing over from lab system from [facility], she states she will call IT to resolve issue.   Rec'd ABG and unable to result in lab via EPIC until this is resolved.   ABG- room air: PH=  7.43 PCO2=  40.5 PO2=  76.8 HCO3=  26.4  BE=  2.4 Sat 95.3%

## 2016-07-14 NOTE — Patient Instructions (Signed)
  Continue taking all current medications without change through the day before surgery.  Have nothing to eat or drink after midnight the night before surgery.  On the morning of surgery take only Synthroid with a sip of water.  You may use your inhalers.

## 2016-07-14 NOTE — Progress Notes (Signed)
VASCULAR LAB PRELIMINARY  PRELIMINARY  PRELIMINARY  PRELIMINARY  Pre-op Cardiac Surgery  Carotid Findings:  Bilateral - No evidence of significant ICA stenosis. Vertebral artery flow is antegrade.  Upper Extremity Right Left  Brachial Pressures 129 Triphasic 132 Triphasic  Radial Waveforms Triphasic Triphasic  Ulnar Waveforms triphasic Triphasic  Palmar Arch (Allen's Test) Normal Normal   Findings:  Doppler waveforms remained normal bilaterally with both radial and ulnar compressions.   Lauren Bartlett, RVS 07/14/2016, 4:32 PM

## 2016-07-15 LAB — BLOOD GAS, ARTERIAL
Acid-Base Excess: 2.4 mmol/L — ABNORMAL HIGH (ref 0.0–2.0)
BICARBONATE: 26.4 mmol/L (ref 20.0–28.0)
Drawn by: 421801
FIO2: 21
O2 SAT: 95.3 %
PATIENT TEMPERATURE: 98.6
pCO2 arterial: 40.5 mmHg (ref 32.0–48.0)
pH, Arterial: 7.43 (ref 7.350–7.450)
pO2, Arterial: 76.8 mmHg — ABNORMAL LOW (ref 83.0–108.0)

## 2016-07-15 LAB — HEMOGLOBIN A1C
Hgb A1c MFr Bld: 5.1 % (ref 4.8–5.6)
Mean Plasma Glucose: 100 mg/dL

## 2016-07-15 LAB — VAS US DOPPLER PRE CABG
LCCADDIAS: -12 cm/s
LEFT ECA DIAS: -4 cm/s
LEFT VERTEBRAL DIAS: -11 cm/s
LICADDIAS: -33 cm/s
LICADSYS: -85 cm/s
LICAPDIAS: -23 cm/s
LICAPSYS: -77 cm/s
Left CCA dist sys: -64 cm/s
Left CCA prox dias: 10 cm/s
Left CCA prox sys: 72 cm/s
RCCADSYS: -101 cm/s
RIGHT ECA DIAS: -2 cm/s
RIGHT VERTEBRAL DIAS: -20 cm/s
Right CCA prox dias: 11 cm/s
Right CCA prox sys: 57 cm/s

## 2016-07-15 LAB — ABO/RH: ABO/RH(D): A POS

## 2016-07-16 MED ORDER — DEXMEDETOMIDINE HCL IN NACL 400 MCG/100ML IV SOLN
0.1000 ug/kg/h | INTRAVENOUS | Status: DC
  Filled 2016-07-16 (×2): qty 100

## 2016-07-16 MED ORDER — SODIUM CHLORIDE 0.9 % IV SOLN
INTRAVENOUS | Status: DC
  Filled 2016-07-16: qty 30

## 2016-07-16 MED ORDER — NITROGLYCERIN IN D5W 200-5 MCG/ML-% IV SOLN
2.0000 ug/min | INTRAVENOUS | Status: DC
  Filled 2016-07-16: qty 250

## 2016-07-16 MED ORDER — MAGNESIUM SULFATE 50 % IJ SOLN
40.0000 meq | INTRAMUSCULAR | Status: DC
  Filled 2016-07-16: qty 10

## 2016-07-16 MED ORDER — EPINEPHRINE PF 1 MG/ML IJ SOLN
0.0000 ug/min | INTRAVENOUS | Status: DC
  Filled 2016-07-16: qty 4

## 2016-07-16 MED ORDER — VANCOMYCIN HCL 1000 MG IV SOLR
INTRAVENOUS | Status: AC
  Administered 2016-07-17: 1000 mL
  Filled 2016-07-16: qty 1000

## 2016-07-16 MED ORDER — SODIUM CHLORIDE 0.9 % IV SOLN
1500.0000 mg | INTRAVENOUS | Status: AC
  Administered 2016-07-17: 1500 mg via INTRAVENOUS
  Filled 2016-07-16: qty 1500

## 2016-07-16 MED ORDER — LIDOCAINE HCL (CARDIAC) 20 MG/ML IV SOLN
260.0000 mg | INTRAVENOUS | Status: DC
  Filled 2016-07-16 (×2): qty 13

## 2016-07-16 MED ORDER — DEXTROSE 5 % IV SOLN
750.0000 mg | INTRAVENOUS | Status: DC
  Filled 2016-07-16: qty 750

## 2016-07-16 MED ORDER — SODIUM CHLORIDE 0.9 % IV SOLN
30.0000 ug/min | INTRAVENOUS | Status: DC
  Filled 2016-07-16: qty 2

## 2016-07-16 MED ORDER — SODIUM CHLORIDE 0.9 % IV SOLN
INTRAVENOUS | Status: DC
  Filled 2016-07-16: qty 2.5

## 2016-07-16 MED ORDER — CEFUROXIME SODIUM 1.5 G IJ SOLR
1.5000 g | INTRAMUSCULAR | Status: DC
  Filled 2016-07-16: qty 1.5

## 2016-07-16 MED ORDER — METOPROLOL TARTRATE 12.5 MG HALF TABLET
12.5000 mg | ORAL_TABLET | Freq: Once | ORAL | Status: AC
  Administered 2016-07-17: 12.5 mg via ORAL
  Filled 2016-07-16: qty 1

## 2016-07-16 MED ORDER — GLUTARALDEHYDE 0.625% SOAKING SOLUTION
TOPICAL | Status: DC | PRN
  Filled 2016-07-16: qty 50

## 2016-07-16 MED ORDER — DOPAMINE-DEXTROSE 3.2-5 MG/ML-% IV SOLN
0.0000 ug/kg/min | INTRAVENOUS | Status: DC
  Filled 2016-07-16: qty 250

## 2016-07-16 MED ORDER — TRANEXAMIC ACID 1000 MG/10ML IV SOLN
1.5000 mg/kg/h | INTRAVENOUS | Status: AC
  Administered 2016-07-17: 1.5 mg/kg/h via INTRAVENOUS
  Filled 2016-07-16: qty 25

## 2016-07-16 MED ORDER — TRANEXAMIC ACID (OHS) PUMP PRIME SOLUTION
2.0000 mg/kg | INTRAVENOUS | Status: DC
  Filled 2016-07-16: qty 1.98

## 2016-07-16 MED ORDER — TRANEXAMIC ACID (OHS) BOLUS VIA INFUSION
15.0000 mg/kg | INTRAVENOUS | Status: AC
  Administered 2016-07-17: 1485 mg via INTRAVENOUS
  Filled 2016-07-16: qty 1485

## 2016-07-16 MED ORDER — MANNITOL 20% IV SOLUTION 10G/50ML
6.4000 g | INTRAVENOUS | Status: DC
  Filled 2016-07-16 (×2): qty 60

## 2016-07-16 MED ORDER — POTASSIUM CHLORIDE 2 MEQ/ML IV SOLN
80.0000 meq | INTRAVENOUS | Status: DC
  Filled 2016-07-16: qty 40

## 2016-07-16 MED ORDER — PLASMA-LYTE 148 IV SOLN
INTRAVENOUS | Status: DC
  Filled 2016-07-16: qty 2.5

## 2016-07-17 ENCOUNTER — Inpatient Hospital Stay (HOSPITAL_COMMUNITY): Payer: Medicare Other

## 2016-07-17 ENCOUNTER — Encounter (HOSPITAL_COMMUNITY)
Admission: RE | Disposition: A | Discharge status: Skilled Nursing Facility | Payer: Self-pay | Source: Ambulatory Visit | Attending: Thoracic Surgery (Cardiothoracic Vascular Surgery)

## 2016-07-17 ENCOUNTER — Encounter (HOSPITAL_COMMUNITY): Payer: Self-pay | Admitting: Urology

## 2016-07-17 ENCOUNTER — Inpatient Hospital Stay (HOSPITAL_COMMUNITY): Payer: Medicare Other | Admitting: Anesthesiology

## 2016-07-17 ENCOUNTER — Inpatient Hospital Stay (HOSPITAL_COMMUNITY)
Admission: RE | Admit: 2016-07-17 | Discharge: 2016-07-23 | DRG: 219 | Disposition: A | Discharge status: Skilled Nursing Facility | Payer: Medicare Other | Source: Ambulatory Visit | Attending: Thoracic Surgery (Cardiothoracic Vascular Surgery) | Admitting: Thoracic Surgery (Cardiothoracic Vascular Surgery)

## 2016-07-17 DIAGNOSIS — J398 Other specified diseases of upper respiratory tract: Secondary | ICD-10-CM | POA: Diagnosis present

## 2016-07-17 DIAGNOSIS — I272 Pulmonary hypertension, unspecified: Secondary | ICD-10-CM | POA: Diagnosis present

## 2016-07-17 DIAGNOSIS — Z961 Presence of intraocular lens: Secondary | ICD-10-CM | POA: Diagnosis present

## 2016-07-17 DIAGNOSIS — J9610 Chronic respiratory failure, unspecified whether with hypoxia or hypercapnia: Secondary | ICD-10-CM | POA: Diagnosis present

## 2016-07-17 DIAGNOSIS — J9811 Atelectasis: Secondary | ICD-10-CM | POA: Diagnosis not present

## 2016-07-17 DIAGNOSIS — R05 Cough: Secondary | ICD-10-CM | POA: Diagnosis not present

## 2016-07-17 DIAGNOSIS — Z9841 Cataract extraction status, right eye: Secondary | ICD-10-CM

## 2016-07-17 DIAGNOSIS — I5032 Chronic diastolic (congestive) heart failure: Secondary | ICD-10-CM | POA: Diagnosis present

## 2016-07-17 DIAGNOSIS — I4581 Long QT syndrome: Secondary | ICD-10-CM | POA: Diagnosis not present

## 2016-07-17 DIAGNOSIS — Z880 Allergy status to penicillin: Secondary | ICD-10-CM | POA: Diagnosis not present

## 2016-07-17 DIAGNOSIS — E038 Other specified hypothyroidism: Secondary | ICD-10-CM | POA: Diagnosis not present

## 2016-07-17 DIAGNOSIS — Z9981 Dependence on supplemental oxygen: Secondary | ICD-10-CM

## 2016-07-17 DIAGNOSIS — K219 Gastro-esophageal reflux disease without esophagitis: Secondary | ICD-10-CM | POA: Diagnosis not present

## 2016-07-17 DIAGNOSIS — I10 Essential (primary) hypertension: Secondary | ICD-10-CM | POA: Diagnosis not present

## 2016-07-17 DIAGNOSIS — I482 Chronic atrial fibrillation: Secondary | ICD-10-CM | POA: Diagnosis not present

## 2016-07-17 DIAGNOSIS — Q600 Renal agenesis, unilateral: Secondary | ICD-10-CM | POA: Diagnosis not present

## 2016-07-17 DIAGNOSIS — F329 Major depressive disorder, single episode, unspecified: Secondary | ICD-10-CM | POA: Diagnosis present

## 2016-07-17 DIAGNOSIS — K5909 Other constipation: Secondary | ICD-10-CM | POA: Diagnosis not present

## 2016-07-17 DIAGNOSIS — Z886 Allergy status to analgesic agent status: Secondary | ICD-10-CM | POA: Diagnosis not present

## 2016-07-17 DIAGNOSIS — J449 Chronic obstructive pulmonary disease, unspecified: Secondary | ICD-10-CM | POA: Diagnosis not present

## 2016-07-17 DIAGNOSIS — Z881 Allergy status to other antibiotic agents status: Secondary | ICD-10-CM | POA: Diagnosis not present

## 2016-07-17 DIAGNOSIS — R0902 Hypoxemia: Secondary | ICD-10-CM | POA: Diagnosis present

## 2016-07-17 DIAGNOSIS — Z9842 Cataract extraction status, left eye: Secondary | ICD-10-CM

## 2016-07-17 DIAGNOSIS — F419 Anxiety disorder, unspecified: Secondary | ICD-10-CM | POA: Diagnosis present

## 2016-07-17 DIAGNOSIS — R0602 Shortness of breath: Secondary | ICD-10-CM | POA: Diagnosis present

## 2016-07-17 DIAGNOSIS — I480 Paroxysmal atrial fibrillation: Secondary | ICD-10-CM | POA: Diagnosis not present

## 2016-07-17 DIAGNOSIS — S299XXA Unspecified injury of thorax, initial encounter: Secondary | ICD-10-CM | POA: Diagnosis not present

## 2016-07-17 DIAGNOSIS — D62 Acute posthemorrhagic anemia: Secondary | ICD-10-CM | POA: Diagnosis not present

## 2016-07-17 DIAGNOSIS — R112 Nausea with vomiting, unspecified: Secondary | ICD-10-CM | POA: Diagnosis not present

## 2016-07-17 DIAGNOSIS — Z9889 Other specified postprocedural states: Secondary | ICD-10-CM

## 2016-07-17 DIAGNOSIS — R498 Other voice and resonance disorders: Secondary | ICD-10-CM | POA: Diagnosis not present

## 2016-07-17 DIAGNOSIS — J439 Emphysema, unspecified: Secondary | ICD-10-CM | POA: Diagnosis present

## 2016-07-17 DIAGNOSIS — I110 Hypertensive heart disease with heart failure: Secondary | ICD-10-CM | POA: Diagnosis present

## 2016-07-17 DIAGNOSIS — I340 Nonrheumatic mitral (valve) insufficiency: Secondary | ICD-10-CM | POA: Diagnosis present

## 2016-07-17 DIAGNOSIS — E039 Hypothyroidism, unspecified: Secondary | ICD-10-CM | POA: Diagnosis present

## 2016-07-17 DIAGNOSIS — I5020 Unspecified systolic (congestive) heart failure: Secondary | ICD-10-CM | POA: Diagnosis not present

## 2016-07-17 DIAGNOSIS — I083 Combined rheumatic disorders of mitral, aortic and tricuspid valves: Secondary | ICD-10-CM | POA: Diagnosis not present

## 2016-07-17 DIAGNOSIS — Z4682 Encounter for fitting and adjustment of non-vascular catheter: Secondary | ICD-10-CM | POA: Diagnosis not present

## 2016-07-17 DIAGNOSIS — Z6841 Body Mass Index (BMI) 40.0 and over, adult: Secondary | ICD-10-CM | POA: Diagnosis not present

## 2016-07-17 DIAGNOSIS — J90 Pleural effusion, not elsewhere classified: Secondary | ICD-10-CM | POA: Diagnosis not present

## 2016-07-17 DIAGNOSIS — J9611 Chronic respiratory failure with hypoxia: Secondary | ICD-10-CM | POA: Diagnosis present

## 2016-07-17 DIAGNOSIS — J302 Other seasonal allergic rhinitis: Secondary | ICD-10-CM | POA: Diagnosis not present

## 2016-07-17 DIAGNOSIS — Z8774 Personal history of (corrected) congenital malformations of heart and circulatory system: Secondary | ICD-10-CM

## 2016-07-17 DIAGNOSIS — M6281 Muscle weakness (generalized): Secondary | ICD-10-CM | POA: Diagnosis not present

## 2016-07-17 DIAGNOSIS — G4733 Obstructive sleep apnea (adult) (pediatric): Secondary | ICD-10-CM | POA: Diagnosis present

## 2016-07-17 DIAGNOSIS — Z789 Other specified health status: Secondary | ICD-10-CM | POA: Diagnosis not present

## 2016-07-17 DIAGNOSIS — E669 Obesity, unspecified: Secondary | ICD-10-CM | POA: Diagnosis present

## 2016-07-17 DIAGNOSIS — T462X5A Adverse effect of other antidysrhythmic drugs, initial encounter: Secondary | ICD-10-CM | POA: Diagnosis not present

## 2016-07-17 DIAGNOSIS — Q211 Atrial septal defect: Secondary | ICD-10-CM | POA: Diagnosis not present

## 2016-07-17 DIAGNOSIS — I081 Rheumatic disorders of both mitral and tricuspid valves: Secondary | ICD-10-CM | POA: Diagnosis not present

## 2016-07-17 DIAGNOSIS — Z79899 Other long term (current) drug therapy: Secondary | ICD-10-CM

## 2016-07-17 DIAGNOSIS — I511 Rupture of chordae tendineae, not elsewhere classified: Secondary | ICD-10-CM | POA: Diagnosis not present

## 2016-07-17 DIAGNOSIS — Z888 Allergy status to other drugs, medicaments and biological substances status: Secondary | ICD-10-CM

## 2016-07-17 DIAGNOSIS — G8918 Other acute postprocedural pain: Secondary | ICD-10-CM | POA: Diagnosis not present

## 2016-07-17 HISTORY — DX: Personal history of (corrected) congenital malformations of heart and circulatory system: Z87.74

## 2016-07-17 HISTORY — PX: PATENT FORAMEN OVALE(PFO) CLOSURE: CATH118300

## 2016-07-17 HISTORY — PX: TEE WITHOUT CARDIOVERSION: SHX5443

## 2016-07-17 HISTORY — PX: MITRAL VALVE REPAIR: SHX2039

## 2016-07-17 LAB — POCT I-STAT 3, ART BLOOD GAS (G3+)
Acid-Base Excess: 2 mmol/L (ref 0.0–2.0)
Acid-Base Excess: 2 mmol/L (ref 0.0–2.0)
BICARBONATE: 26 mmol/L (ref 20.0–28.0)
Bicarbonate: 26.4 mmol/L (ref 20.0–28.0)
O2 Saturation: 100 %
O2 Saturation: 99 %
PCO2 ART: 38.7 mmHg (ref 32.0–48.0)
PCO2 ART: 44.9 mmHg (ref 32.0–48.0)
PH ART: 7.441 (ref 7.350–7.450)
PO2 ART: 308 mmHg — AB (ref 83.0–108.0)
PO2 ART: 143 mmHg — AB (ref 83.0–108.0)
Patient temperature: 41.03
TCO2: 28 mmol/L (ref 0–100)
TCO2: 27 mmol/L (ref 0–100)
pH, Arterial: 7.386 (ref 7.350–7.450)

## 2016-07-17 LAB — GLUCOSE, CAPILLARY
GLUCOSE-CAPILLARY: 100 mg/dL — AB (ref 65–99)
GLUCOSE-CAPILLARY: 100 mg/dL — AB (ref 65–99)
GLUCOSE-CAPILLARY: 104 mg/dL — AB (ref 65–99)
GLUCOSE-CAPILLARY: 111 mg/dL — AB (ref 65–99)
GLUCOSE-CAPILLARY: 117 mg/dL — AB (ref 65–99)
Glucose-Capillary: 105 mg/dL — ABNORMAL HIGH (ref 65–99)
Glucose-Capillary: 106 mg/dL — ABNORMAL HIGH (ref 65–99)
Glucose-Capillary: 115 mg/dL — ABNORMAL HIGH (ref 65–99)
Glucose-Capillary: 140 mg/dL — ABNORMAL HIGH (ref 65–99)

## 2016-07-17 LAB — POCT I-STAT, CHEM 8
BUN: 22 mg/dL — AB (ref 6–20)
BUN: 18 mg/dL (ref 6–20)
BUN: 20 mg/dL (ref 6–20)
BUN: 20 mg/dL (ref 6–20)
BUN: 18 mg/dL (ref 6–20)
BUN: 18 mg/dL (ref 6–20)
BUN: 16 mg/dL (ref 6–20)
CALCIUM ION: 1.11 mmol/L — AB (ref 1.15–1.40)
CALCIUM ION: 1.08 mmol/L — AB (ref 1.15–1.40)
CALCIUM ION: 1 mmol/L — AB (ref 1.15–1.40)
CALCIUM ION: 1 mmol/L — AB (ref 1.15–1.40)
CHLORIDE: 103 mmol/L (ref 101–111)
CREATININE: 0.7 mg/dL (ref 0.44–1.00)
CREATININE: 0.7 mg/dL (ref 0.44–1.00)
Calcium, Ion: 0.93 mmol/L — ABNORMAL LOW (ref 1.15–1.40)
Calcium, Ion: 0.96 mmol/L — ABNORMAL LOW (ref 1.15–1.40)
Calcium, Ion: 0.99 mmol/L — ABNORMAL LOW (ref 1.15–1.40)
Chloride: 103 mmol/L (ref 101–111)
Chloride: 99 mmol/L — ABNORMAL LOW (ref 101–111)
Chloride: 102 mmol/L (ref 101–111)
Chloride: 105 mmol/L (ref 101–111)
Chloride: 106 mmol/L (ref 101–111)
Chloride: 109 mmol/L (ref 101–111)
Creatinine, Ser: 0.7 mg/dL (ref 0.44–1.00)
Creatinine, Ser: 0.6 mg/dL (ref 0.44–1.00)
Creatinine, Ser: 0.6 mg/dL (ref 0.44–1.00)
Creatinine, Ser: 0.7 mg/dL (ref 0.44–1.00)
Creatinine, Ser: 0.6 mg/dL (ref 0.44–1.00)
GLUCOSE: 109 mg/dL — AB (ref 65–99)
GLUCOSE: 159 mg/dL — AB (ref 65–99)
GLUCOSE: 123 mg/dL — AB (ref 65–99)
GLUCOSE: 119 mg/dL — AB (ref 65–99)
Glucose, Bld: 131 mg/dL — ABNORMAL HIGH (ref 65–99)
Glucose, Bld: 134 mg/dL — ABNORMAL HIGH (ref 65–99)
Glucose, Bld: 165 mg/dL — ABNORMAL HIGH (ref 65–99)
HCT: 30 % — ABNORMAL LOW (ref 36.0–46.0)
HCT: 24 % — ABNORMAL LOW (ref 36.0–46.0)
HCT: 26 % — ABNORMAL LOW (ref 36.0–46.0)
HEMATOCRIT: 25 % — AB (ref 36.0–46.0)
HEMATOCRIT: 29 % — AB (ref 36.0–46.0)
HEMATOCRIT: 26 % — AB (ref 36.0–46.0)
HEMATOCRIT: 31 % — AB (ref 36.0–46.0)
HEMOGLOBIN: 8.5 g/dL — AB (ref 12.0–15.0)
HEMOGLOBIN: 8.8 g/dL — AB (ref 12.0–15.0)
HEMOGLOBIN: 8.2 g/dL — AB (ref 12.0–15.0)
HEMOGLOBIN: 10.5 g/dL — AB (ref 12.0–15.0)
Hemoglobin: 10.2 g/dL — ABNORMAL LOW (ref 12.0–15.0)
Hemoglobin: 9.9 g/dL — ABNORMAL LOW (ref 12.0–15.0)
Hemoglobin: 8.8 g/dL — ABNORMAL LOW (ref 12.0–15.0)
POTASSIUM: 3.6 mmol/L (ref 3.5–5.1)
POTASSIUM: 4.3 mmol/L (ref 3.5–5.1)
POTASSIUM: 4.3 mmol/L (ref 3.5–5.1)
Potassium: 4 mmol/L (ref 3.5–5.1)
Potassium: 3.9 mmol/L (ref 3.5–5.1)
Potassium: 4.4 mmol/L (ref 3.5–5.1)
Potassium: 3.9 mmol/L (ref 3.5–5.1)
SODIUM: 138 mmol/L (ref 135–145)
SODIUM: 137 mmol/L (ref 135–145)
SODIUM: 138 mmol/L (ref 135–145)
SODIUM: 140 mmol/L (ref 135–145)
SODIUM: 140 mmol/L (ref 135–145)
Sodium: 137 mmol/L (ref 135–145)
Sodium: 138 mmol/L (ref 135–145)
TCO2: 31 mmol/L (ref 0–100)
TCO2: 27 mmol/L (ref 0–100)
TCO2: 29 mmol/L (ref 0–100)
TCO2: 28 mmol/L (ref 0–100)
TCO2: 25 mmol/L (ref 0–100)
TCO2: 24 mmol/L (ref 0–100)
TCO2: 24 mmol/L (ref 0–100)

## 2016-07-17 LAB — CBC
HCT: 32.8 % — ABNORMAL LOW (ref 36.0–46.0)
HEMATOCRIT: 31.8 % — AB (ref 36.0–46.0)
HEMOGLOBIN: 10.3 g/dL — AB (ref 12.0–15.0)
Hemoglobin: 10.2 g/dL — ABNORMAL LOW (ref 12.0–15.0)
MCH: 27.9 pg (ref 26.0–34.0)
MCH: 28.1 pg (ref 26.0–34.0)
MCHC: 31.4 g/dL (ref 30.0–36.0)
MCHC: 32.1 g/dL (ref 30.0–36.0)
MCV: 88.9 fL (ref 78.0–100.0)
MCV: 87.6 fL (ref 78.0–100.0)
PLATELETS: 144 10*3/uL — AB (ref 150–400)
PLATELETS: 146 10*3/uL — AB (ref 150–400)
RBC: 3.69 MIL/uL — ABNORMAL LOW (ref 3.87–5.11)
RBC: 3.63 MIL/uL — AB (ref 3.87–5.11)
RDW: 14.6 % (ref 11.5–15.5)
RDW: 14.5 % (ref 11.5–15.5)
WBC: 20.1 10*3/uL — ABNORMAL HIGH (ref 4.0–10.5)
WBC: 16.5 10*3/uL — ABNORMAL HIGH (ref 4.0–10.5)

## 2016-07-17 LAB — PREPARE RBC (CROSSMATCH)

## 2016-07-17 LAB — MAGNESIUM: MAGNESIUM: 2.8 mg/dL — AB (ref 1.7–2.4)

## 2016-07-17 LAB — POCT I-STAT 4, (NA,K, GLUC, HGB,HCT)
Glucose, Bld: 103 mg/dL — ABNORMAL HIGH (ref 65–99)
HCT: 31 % — ABNORMAL LOW (ref 36.0–46.0)
Hemoglobin: 10.5 g/dL — ABNORMAL LOW (ref 12.0–15.0)
POTASSIUM: 3.9 mmol/L (ref 3.5–5.1)
SODIUM: 141 mmol/L (ref 135–145)

## 2016-07-17 LAB — PLATELET COUNT: Platelets: 147 10*3/uL — ABNORMAL LOW (ref 150–400)

## 2016-07-17 LAB — CREATININE, SERUM
CREATININE: 0.74 mg/dL (ref 0.44–1.00)
GFR calc non Af Amer: >60 mL/min (ref >60)

## 2016-07-17 LAB — APTT: aPTT: 37 seconds — ABNORMAL HIGH (ref 24–36)

## 2016-07-17 LAB — HEMOGLOBIN AND HEMATOCRIT, BLOOD
HCT: 23.1 % — ABNORMAL LOW (ref 36.0–46.0)
Hemoglobin: 7.5 g/dL — ABNORMAL LOW (ref 12.0–15.0)

## 2016-07-17 LAB — PROTIME-INR
INR: 1.34
PROTHROMBIN TIME: 16.6 s — AB (ref 11.4–15.2)

## 2016-07-17 SURGERY — REPAIR, MITRAL VALVE, MINIMALLY INVASIVE
Anesthesia: General | Site: Chest | Laterality: Right

## 2016-07-17 MED ORDER — SODIUM CHLORIDE 0.9 % IJ SOLN
INTRAMUSCULAR | Status: AC
  Filled 2016-07-17: qty 30

## 2016-07-17 MED ORDER — ASPIRIN EC 325 MG PO TBEC
325.0000 mg | DELAYED_RELEASE_TABLET | Freq: Every day | ORAL | Status: DC
  Administered 2016-07-18 – 2016-07-20 (×3): 325 mg via ORAL
  Filled 2016-07-17 (×3): qty 1

## 2016-07-17 MED ORDER — DEXMEDETOMIDINE HCL IN NACL 400 MCG/100ML IV SOLN
INTRAVENOUS | Status: DC | PRN
  Administered 2016-07-17: 14:00:00 via INTRAVENOUS
  Administered 2016-07-17: .3 ug/kg/h via INTRAVENOUS

## 2016-07-17 MED ORDER — PANTOPRAZOLE SODIUM 40 MG PO TBEC
40.0000 mg | DELAYED_RELEASE_TABLET | Freq: Every day | ORAL | Status: DC
  Administered 2016-07-19 – 2016-07-23 (×5): 40 mg via ORAL
  Filled 2016-07-17 (×5): qty 1

## 2016-07-17 MED ORDER — PHENYLEPHRINE 40 MCG/ML (10ML) SYRINGE FOR IV PUSH (FOR BLOOD PRESSURE SUPPORT)
PREFILLED_SYRINGE | INTRAVENOUS | Status: AC
  Filled 2016-07-17: qty 10

## 2016-07-17 MED ORDER — ASPIRIN 81 MG PO CHEW: 324.0000 mg | CHEWABLE_TABLET | Freq: Every day | ORAL | Status: DC

## 2016-07-17 MED ORDER — SODIUM CHLORIDE 0.9 % IV SOLN
INTRAVENOUS | Status: DC
  Administered 2016-07-17: 20:00:00 via INTRAVENOUS

## 2016-07-17 MED ORDER — PHENYLEPHRINE HCL 10 MG/ML IJ SOLN
INTRAVENOUS | Status: DC | PRN
  Administered 2016-07-17: 30 ug/min via INTRAVENOUS

## 2016-07-17 MED ORDER — LACTATED RINGERS IV SOLN: 500.0000 mL | Freq: Once | INTRAVENOUS | Status: DC | PRN

## 2016-07-17 MED ORDER — SODIUM CHLORIDE 0.9 % IV SOLN
0.0000 ug/kg/h | INTRAVENOUS | Status: DC
  Filled 2016-07-17: qty 2

## 2016-07-17 MED ORDER — TRAMADOL HCL 50 MG PO TABS
50.0000 mg | ORAL_TABLET | ORAL | Status: DC | PRN
  Administered 2016-07-20 – 2016-07-23 (×4): 50 mg via ORAL
  Filled 2016-07-17 (×4): qty 1

## 2016-07-17 MED ORDER — PHENYLEPHRINE HCL 10 MG/ML IJ SOLN
INTRAVENOUS | Status: DC | PRN
  Administered 2016-07-17: 10 ug/min via INTRAVENOUS

## 2016-07-17 MED ORDER — CHLORHEXIDINE GLUCONATE 0.12 % MT SOLN
15.0000 mL | Freq: Once | OROMUCOSAL | Status: AC
  Administered 2016-07-17: 15 mL via OROMUCOSAL
  Filled 2016-07-17: qty 15

## 2016-07-17 MED ORDER — FENTANYL CITRATE (PF) 250 MCG/5ML IJ SOLN
INTRAMUSCULAR | Status: AC
  Filled 2016-07-17: qty 20

## 2016-07-17 MED ORDER — ACETAMINOPHEN 500 MG PO TABS
1000.0000 mg | ORAL_TABLET | Freq: Four times a day (QID) | ORAL | Status: AC
  Administered 2016-07-18 – 2016-07-22 (×12): 1000 mg via ORAL
  Filled 2016-07-17 (×16): qty 2

## 2016-07-17 MED ORDER — PROPOFOL 10 MG/ML IV BOLUS
INTRAVENOUS | Status: DC | PRN
  Administered 2016-07-17: 50 mg via INTRAVENOUS

## 2016-07-17 MED ORDER — METOPROLOL TARTRATE 25 MG/10 ML ORAL SUSPENSION: 12.5000 mg | Freq: Two times a day (BID) | ORAL | Status: DC

## 2016-07-17 MED ORDER — SODIUM CHLORIDE 0.9% FLUSH
3.0000 mL | INTRAVENOUS | Status: DC | PRN
Start: 2016-07-18 — End: 2016-07-23

## 2016-07-17 MED ORDER — OXYCODONE HCL 5 MG PO TABS
5.0000 mg | ORAL_TABLET | ORAL | Status: DC | PRN
  Administered 2016-07-19 – 2016-07-20 (×2): 5 mg via ORAL
  Filled 2016-07-17 (×2): qty 1

## 2016-07-17 MED ORDER — LACTATED RINGERS IV SOLN
INTRAVENOUS | Status: DC | PRN
  Administered 2016-07-17 (×2): via INTRAVENOUS

## 2016-07-17 MED ORDER — METOPROLOL TARTRATE 5 MG/5ML IV SOLN
2.5000 mg | INTRAVENOUS | Status: DC | PRN
Start: 2016-07-17 — End: 2016-07-23

## 2016-07-17 MED ORDER — SODIUM CHLORIDE 0.9 % IV SOLN
INTRAVENOUS | Status: DC
  Administered 2016-07-17: 1.5 [IU]/h via INTRAVENOUS
  Filled 2016-07-17 (×2): qty 2.5

## 2016-07-17 MED ORDER — PROTAMINE SULFATE 10 MG/ML IV SOLN
INTRAVENOUS | Status: DC | PRN
  Administered 2016-07-17: 20 mg via INTRAVENOUS

## 2016-07-17 MED ORDER — SODIUM CHLORIDE 0.9 % IV SOLN: 10.0000 mL/h | Freq: Once | INTRAVENOUS | Status: DC

## 2016-07-17 MED ORDER — MIDAZOLAM HCL 5 MG/5ML IJ SOLN
INTRAMUSCULAR | Status: DC | PRN
  Administered 2016-07-17: 1 mg via INTRAVENOUS
  Administered 2016-07-17 (×2): .5 mg via INTRAVENOUS
  Administered 2016-07-17: 1 mg via INTRAVENOUS
  Administered 2016-07-17: 2 mg via INTRAVENOUS
  Administered 2016-07-17 (×5): 1 mg via INTRAVENOUS

## 2016-07-17 MED ORDER — CHLORHEXIDINE GLUCONATE 0.12 % MT SOLN
15.0000 mL | OROMUCOSAL | Status: AC
  Administered 2016-07-17: 15 mL via OROMUCOSAL

## 2016-07-17 MED ORDER — ALBUMIN HUMAN 5 % IV SOLN
250.0000 mL | INTRAVENOUS | Status: AC | PRN
  Administered 2016-07-17 – 2016-07-18 (×4): 250 mL via INTRAVENOUS
  Filled 2016-07-17 (×2): qty 250

## 2016-07-17 MED ORDER — SODIUM CHLORIDE 0.9 % IV SOLN
0.0000 ug/min | INTRAVENOUS | Status: DC
  Administered 2016-07-18: 15 ug/min via INTRAVENOUS
  Filled 2016-07-17 (×2): qty 2

## 2016-07-17 MED ORDER — FENTANYL CITRATE (PF) 100 MCG/2ML IJ SOLN
INTRAMUSCULAR | Status: DC | PRN
  Administered 2016-07-17: 50 ug via INTRAVENOUS
  Administered 2016-07-17: 750 ug via INTRAVENOUS
  Administered 2016-07-17: 50 ug via INTRAVENOUS
  Administered 2016-07-17: 25 ug via INTRAVENOUS
  Administered 2016-07-17 (×2): 50 ug via INTRAVENOUS
  Administered 2016-07-17: 25 ug via INTRAVENOUS

## 2016-07-17 MED ORDER — LIDOCAINE 2% (20 MG/ML) 5 ML SYRINGE
INTRAMUSCULAR | Status: AC
  Filled 2016-07-17: qty 5

## 2016-07-17 MED ORDER — LACTATED RINGERS IV SOLN
INTRAVENOUS | Status: DC
  Administered 2016-07-19: 07:00:00 via INTRAVENOUS

## 2016-07-17 MED ORDER — MIDAZOLAM HCL 2 MG/2ML IJ SOLN: 2.0000 mg | INTRAMUSCULAR | Status: DC | PRN

## 2016-07-17 MED ORDER — DEXTROSE 5 % IV SOLN
1.5000 g | Freq: Two times a day (BID) | INTRAVENOUS | Status: AC
  Administered 2016-07-17 – 2016-07-19 (×4): 1.5 g via INTRAVENOUS
  Filled 2016-07-17 (×4): qty 1.5

## 2016-07-17 MED ORDER — 0.9 % SODIUM CHLORIDE (POUR BTL) OPTIME
TOPICAL | Status: DC | PRN
  Administered 2016-07-17: 6000 mL

## 2016-07-17 MED ORDER — SODIUM CHLORIDE 0.9 % IV SOLN: INTRAVENOUS | Status: DC

## 2016-07-17 MED ORDER — DOCUSATE SODIUM 100 MG PO CAPS
200.0000 mg | ORAL_CAPSULE | Freq: Every day | ORAL | Status: DC
  Administered 2016-07-18 – 2016-07-22 (×4): 200 mg via ORAL
  Filled 2016-07-17 (×4): qty 2

## 2016-07-17 MED ORDER — NITROGLYCERIN IN D5W 200-5 MCG/ML-% IV SOLN
INTRAVENOUS | Status: DC | PRN
Start: 2016-07-17 — End: 2016-07-17
  Administered 2016-07-17: 10 ug/min via INTRAVENOUS

## 2016-07-17 MED ORDER — FENTANYL CITRATE (PF) 250 MCG/5ML IJ SOLN
INTRAMUSCULAR | Status: AC
  Filled 2016-07-17: qty 5

## 2016-07-17 MED ORDER — MORPHINE SULFATE (PF) 4 MG/ML IV SOLN: 1.0000 mg | INTRAVENOUS | Status: DC | PRN

## 2016-07-17 MED ORDER — LACTATED RINGERS IV SOLN
INTRAVENOUS | Status: DC | PRN
Start: 2016-07-17 — End: 2016-07-17
  Administered 2016-07-17: 07:00:00 via INTRAVENOUS

## 2016-07-17 MED ORDER — PHENYLEPHRINE HCL 10 MG/ML IJ SOLN
INTRAMUSCULAR | Status: DC | PRN
  Administered 2016-07-17: 40 ug via INTRAVENOUS
  Administered 2016-07-17 (×2): 80 ug via INTRAVENOUS
  Administered 2016-07-17 (×5): 40 ug via INTRAVENOUS
  Administered 2016-07-17: 80 ug via INTRAVENOUS

## 2016-07-17 MED ORDER — INSULIN REGULAR BOLUS VIA INFUSION
0.0000 [IU] | Freq: Three times a day (TID) | INTRAVENOUS | Status: DC
  Filled 2016-07-17: qty 10

## 2016-07-17 MED ORDER — HEPARIN SODIUM (PORCINE) 1000 UNIT/ML IJ SOLN
INTRAMUSCULAR | Status: AC
  Filled 2016-07-17: qty 1

## 2016-07-17 MED ORDER — ONDANSETRON HCL 4 MG/2ML IJ SOLN
4.0000 mg | Freq: Four times a day (QID) | INTRAMUSCULAR | Status: DC | PRN
  Administered 2016-07-18 – 2016-07-21 (×5): 4 mg via INTRAVENOUS
  Filled 2016-07-17 (×6): qty 2

## 2016-07-17 MED ORDER — SODIUM CHLORIDE 0.45 % IV SOLN
INTRAVENOUS | Status: DC | PRN
  Administered 2016-07-17: 16:00:00 via INTRAVENOUS

## 2016-07-17 MED ORDER — NITROGLYCERIN IN D5W 200-5 MCG/ML-% IV SOLN: 0.0000 ug/min | INTRAVENOUS | Status: DC

## 2016-07-17 MED ORDER — LACTATED RINGERS IV SOLN: INTRAVENOUS | Status: DC

## 2016-07-17 MED ORDER — SODIUM CHLORIDE 0.9 % IV SOLN
INTRAVENOUS | Status: DC | PRN
  Administered 2016-07-17: 1 [IU]/h via INTRAVENOUS

## 2016-07-17 MED ORDER — IPRATROPIUM-ALBUTEROL 0.5-2.5 (3) MG/3ML IN SOLN
3.0000 mL | Freq: Four times a day (QID) | RESPIRATORY_TRACT | Status: DC
  Administered 2016-07-17 – 2016-07-18 (×3): 3 mL via RESPIRATORY_TRACT
  Filled 2016-07-17 (×3): qty 3

## 2016-07-17 MED ORDER — VANCOMYCIN HCL IN DEXTROSE 1-5 GM/200ML-% IV SOLN
1000.0000 mg | Freq: Once | INTRAVENOUS | Status: AC
  Administered 2016-07-17: 1000 mg via INTRAVENOUS
  Filled 2016-07-17: qty 200

## 2016-07-17 MED ORDER — SODIUM CHLORIDE 0.9 % IV SOLN
INTRAVENOUS | Status: DC | PRN
  Administered 2016-07-17 (×2): via INTRAVENOUS

## 2016-07-17 MED ORDER — ARTIFICIAL TEARS OP OINT
TOPICAL_OINTMENT | OPHTHALMIC | Status: DC | PRN
  Administered 2016-07-17: 1 via OPHTHALMIC

## 2016-07-17 MED ORDER — LIDOCAINE HCL (CARDIAC) 20 MG/ML IV SOLN
INTRAVENOUS | Status: DC | PRN
  Administered 2016-07-17: 100 mg via INTRAVENOUS
  Administered 2016-07-17: 40 mg via INTRAVENOUS

## 2016-07-17 MED ORDER — MORPHINE SULFATE (PF) 4 MG/ML IV SOLN
1.0000 mg | INTRAVENOUS | Status: AC | PRN
  Administered 2016-07-18 (×3): 2 mg via INTRAVENOUS
  Administered 2016-07-18 – 2016-07-19 (×2): 1 mg via INTRAVENOUS
  Administered 2016-07-19 (×2): 2 mg via INTRAVENOUS
  Filled 2016-07-17 (×7): qty 1

## 2016-07-17 MED ORDER — ACETAMINOPHEN 650 MG RE SUPP
650.0000 mg | Freq: Once | RECTAL | Status: AC
  Administered 2016-07-17: 650 mg via RECTAL

## 2016-07-17 MED ORDER — MIDAZOLAM HCL 2 MG/2ML IJ SOLN
INTRAMUSCULAR | Status: AC
  Filled 2016-07-17: qty 2

## 2016-07-17 MED ORDER — MIDAZOLAM HCL 10 MG/2ML IJ SOLN
INTRAMUSCULAR | Status: AC
  Filled 2016-07-17: qty 2

## 2016-07-17 MED ORDER — ACETAMINOPHEN 160 MG/5ML PO SOLN: 650.0000 mg | Freq: Once | ORAL | Status: AC

## 2016-07-17 MED ORDER — METOPROLOL TARTRATE 12.5 MG HALF TABLET
12.5000 mg | ORAL_TABLET | Freq: Two times a day (BID) | ORAL | Status: DC
  Administered 2016-07-18 – 2016-07-19 (×2): 12.5 mg via ORAL
  Filled 2016-07-17 (×3): qty 1

## 2016-07-17 MED ORDER — FAMOTIDINE IN NACL 20-0.9 MG/50ML-% IV SOLN
20.0000 mg | Freq: Two times a day (BID) | INTRAVENOUS | Status: AC
  Administered 2016-07-17 (×2): 20 mg via INTRAVENOUS
  Filled 2016-07-17: qty 50

## 2016-07-17 MED ORDER — BISACODYL 10 MG RE SUPP: 10.0000 mg | Freq: Every day | RECTAL | Status: DC

## 2016-07-17 MED ORDER — PROPOFOL 10 MG/ML IV BOLUS
INTRAVENOUS | Status: AC
  Filled 2016-07-17: qty 20

## 2016-07-17 MED ORDER — DEXTROSE 5 % IV SOLN
INTRAVENOUS | Status: DC | PRN
  Administered 2016-07-17: .75 g via INTRAVENOUS
  Administered 2016-07-17: 1.5 g via INTRAVENOUS

## 2016-07-17 MED ORDER — BISACODYL 5 MG PO TBEC
10.0000 mg | DELAYED_RELEASE_TABLET | Freq: Every day | ORAL | Status: DC
  Administered 2016-07-18 – 2016-07-20 (×3): 10 mg via ORAL
  Filled 2016-07-17 (×3): qty 2

## 2016-07-17 MED ORDER — ROCURONIUM BROMIDE 100 MG/10ML IV SOLN
INTRAVENOUS | Status: DC | PRN
  Administered 2016-07-17 (×2): 50 mg via INTRAVENOUS
  Administered 2016-07-17: 30 mg via INTRAVENOUS
  Administered 2016-07-17: 50 mg via INTRAVENOUS

## 2016-07-17 MED ORDER — LEVALBUTEROL HCL 0.63 MG/3ML IN NEBU
0.6300 mg | INHALATION_SOLUTION | Freq: Four times a day (QID) | RESPIRATORY_TRACT | Status: DC
  Administered 2016-07-17: 0.63 mg via RESPIRATORY_TRACT

## 2016-07-17 MED ORDER — CHLORHEXIDINE GLUCONATE 4 % EX LIQD: 30.0000 mL | CUTANEOUS | Status: DC

## 2016-07-17 MED ORDER — SODIUM CHLORIDE 0.9 % IV SOLN
30.0000 meq | Freq: Once | INTRAVENOUS | Status: AC
  Administered 2016-07-17: 30 meq via INTRAVENOUS
  Filled 2016-07-17: qty 15

## 2016-07-17 MED ORDER — CHLORHEXIDINE GLUCONATE 0.12% ORAL RINSE (MEDLINE KIT)
15.0000 mL | Freq: Two times a day (BID) | OROMUCOSAL | Status: DC
  Administered 2016-07-17: 15 mL via OROMUCOSAL

## 2016-07-17 MED ORDER — ACETAMINOPHEN 160 MG/5ML PO SOLN
1000.0000 mg | Freq: Four times a day (QID) | ORAL | Status: DC
  Administered 2016-07-17: 1000 mg
  Filled 2016-07-17: qty 40.6

## 2016-07-17 MED ORDER — MAGNESIUM SULFATE 4 GM/100ML IV SOLN
4.0000 g | Freq: Once | INTRAVENOUS | Status: AC
  Administered 2016-07-17: 4 g via INTRAVENOUS
  Filled 2016-07-17: qty 100

## 2016-07-17 MED ORDER — MORPHINE SULFATE (PF) 2 MG/ML IV SOLN: 1.0000 mg | INTRAVENOUS | Status: DC | PRN

## 2016-07-17 MED ORDER — HEPARIN SODIUM (PORCINE) 1000 UNIT/ML IJ SOLN
INTRAMUSCULAR | Status: DC | PRN
  Administered 2016-07-17: 22000 [IU] via INTRAVENOUS

## 2016-07-17 MED ORDER — ORAL CARE MOUTH RINSE
15.0000 mL | Freq: Four times a day (QID) | OROMUCOSAL | Status: DC
  Administered 2016-07-18: 15 mL via OROMUCOSAL

## 2016-07-17 MED ORDER — SODIUM CHLORIDE 0.9% FLUSH
3.0000 mL | Freq: Two times a day (BID) | INTRAVENOUS | Status: DC
  Administered 2016-07-18 – 2016-07-22 (×7): 3 mL via INTRAVENOUS

## 2016-07-17 MED ORDER — SODIUM CHLORIDE 0.9 % IV SOLN: 250.0000 mL | INTRAVENOUS | Status: DC

## 2016-07-17 MED FILL — Magnesium Sulfate Inj 50%: INTRAMUSCULAR | Qty: 10 | Status: AC

## 2016-07-17 MED FILL — Potassium Chloride Inj 2 mEq/ML: INTRAVENOUS | Qty: 40 | Status: AC

## 2016-07-17 MED FILL — Heparin Sodium (Porcine) Inj 1000 Unit/ML: INTRAMUSCULAR | Qty: 2500 | Status: AC

## 2016-07-17 MED FILL — Heparin Sodium (Porcine) Inj 1000 Unit/ML: INTRAMUSCULAR | Qty: 30 | Status: AC

## 2016-07-17 SURGICAL SUPPLY — 101 items
ADAPTER CARDIO PERF ANTE/RETRO (ADAPTER) ×3 IMPLANT
ADAPTER DLP PERFUSION .25INX2I (MISCELLANEOUS) ×3 IMPLANT
BAG DECANTER FOR FLEXI CONT (MISCELLANEOUS) ×3 IMPLANT
BLADE STERNUM SYSTEM 6 (BLADE) ×3 IMPLANT
BLADE SURG 11 STRL SS (BLADE) ×3 IMPLANT
CANISTER SUCT 3000ML PPV (MISCELLANEOUS) ×6 IMPLANT
CANNULA FEM VENOUS REMOTE 22FR (CANNULA) ×3 IMPLANT
CANNULA FEMORAL ART 14 SM (MISCELLANEOUS) ×3 IMPLANT
CANNULA GUNDRY RCSP 15FR (MISCELLANEOUS) ×3 IMPLANT
CANNULA OPTISITE PERFUSION 16F (CANNULA) IMPLANT
CANNULA OPTISITE PERFUSION 18F (CANNULA) ×3 IMPLANT
CANNULA SUMP PERICARDIAL (CANNULA) ×6 IMPLANT
CATH KIT ON Q 5IN SLV (PAIN MANAGEMENT) IMPLANT
CONN ST 1/4X3/8  BEN (MISCELLANEOUS) ×2
CONN ST 1/4X3/8 BEN (MISCELLANEOUS) ×4 IMPLANT
CONNECTOR 1/2X3/8X1/2 3 WAY (MISCELLANEOUS) ×1
CONNECTOR 1/2X3/8X1/2 3WAY (MISCELLANEOUS) ×2 IMPLANT
CONT SPEC 4OZ CLIKSEAL STRL BL (MISCELLANEOUS) ×3 IMPLANT
COVER BACK TABLE 24X17X13 BIG (DRAPES) ×3 IMPLANT
COVER PROBE W GEL 5X96 (DRAPES) ×3 IMPLANT
CRADLE DONUT ADULT HEAD (MISCELLANEOUS) ×3 IMPLANT
DERMABOND ADVANCED (GAUZE/BANDAGES/DRESSINGS) ×2
DERMABOND ADVANCED .7 DNX12 (GAUZE/BANDAGES/DRESSINGS) ×4 IMPLANT
DEVICE PMI PUNCTURE CLOSURE (MISCELLANEOUS) ×3 IMPLANT
DEVICE SUT CK QUICK LOAD MINI (Prosthesis & Implant Heart) ×6 IMPLANT
DEVICE TROCAR PUNCTURE CLOSURE (ENDOMECHANICALS) ×3 IMPLANT
DRAIN CHANNEL 28F RND 3/8 FF (WOUND CARE) ×6 IMPLANT
DRAPE BILATERAL SPLIT (DRAPES) ×3 IMPLANT
DRAPE C-ARM 42X72 X-RAY (DRAPES) ×3 IMPLANT
DRAPE CV SPLIT W-CLR ANES SCRN (DRAPES) ×3 IMPLANT
DRAPE INCISE IOBAN 66X45 STRL (DRAPES) ×9 IMPLANT
DRAPE SLUSH/WARMER DISC (DRAPES) ×3 IMPLANT
DRSG COVADERM 4X8 (GAUZE/BANDAGES/DRESSINGS) ×3 IMPLANT
ELECT BLADE 6.5 EXT (BLADE) ×3 IMPLANT
ELECT REM PT RETURN 9FT ADLT (ELECTROSURGICAL) ×6
ELECTRODE REM PT RTRN 9FT ADLT (ELECTROSURGICAL) ×4 IMPLANT
FELT TEFLON 1X6 (MISCELLANEOUS) ×3 IMPLANT
FEMORAL VENOUS CANN RAP (CANNULA) IMPLANT
GAUZE SPONGE 4X4 12PLY STRL (GAUZE/BANDAGES/DRESSINGS) ×3 IMPLANT
GLOVE BIO SURGEON STRL SZ 6 (GLOVE) ×3 IMPLANT
GLOVE BIO SURGEON STRL SZ 6.5 (GLOVE) ×3 IMPLANT
GLOVE BIOGEL PI IND STRL 6 (GLOVE) ×2 IMPLANT
GLOVE BIOGEL PI IND STRL 6.5 (GLOVE) ×12 IMPLANT
GLOVE BIOGEL PI INDICATOR 6 (GLOVE) ×1
GLOVE BIOGEL PI INDICATOR 6.5 (GLOVE) ×6
GLOVE ORTHO TXT STRL SZ7.5 (GLOVE) ×9 IMPLANT
GOWN STRL REUS W/ TWL LRG LVL3 (GOWN DISPOSABLE) ×12 IMPLANT
GOWN STRL REUS W/TWL LRG LVL3 (GOWN DISPOSABLE) ×6
KIT BASIN OR (CUSTOM PROCEDURE TRAY) ×3 IMPLANT
KIT DILATOR VASC 18G NDL (KITS) ×3 IMPLANT
KIT DRAINAGE VACCUM ASSIST (KITS) ×3 IMPLANT
KIT ROOM TURNOVER OR (KITS) ×3 IMPLANT
KIT SUCTION CATH 14FR (SUCTIONS) ×3 IMPLANT
KIT SUT CK MINI COMBO 4X17 (Prosthesis & Implant Heart) ×3 IMPLANT
LEAD PACING MYOCARDI (MISCELLANEOUS) ×3 IMPLANT
LINE VENT (MISCELLANEOUS) ×3 IMPLANT
NEEDLE AORTIC ROOT 14G 7F (CATHETERS) ×3 IMPLANT
NS IRRIG 1000ML POUR BTL (IV SOLUTION) ×18 IMPLANT
PACK OPEN HEART (CUSTOM PROCEDURE TRAY) ×3 IMPLANT
PAD ARMBOARD 7.5X6 YLW CONV (MISCELLANEOUS) ×6 IMPLANT
PAD ELECT DEFIB RADIOL ZOLL (MISCELLANEOUS) ×3 IMPLANT
RING MITRAL MEMO 3D 26MM SMD26 (Prosthesis & Implant Heart) ×3 IMPLANT
SET CANNULATION TOURNIQUET (MISCELLANEOUS) ×3 IMPLANT
SET CARDIOPLEGIA MPS 5001102 (MISCELLANEOUS) ×3 IMPLANT
SET IRRIG TUBING LAPAROSCOPIC (IRRIGATION / IRRIGATOR) ×3 IMPLANT
SOLUTION ANTI FOG 6CC (MISCELLANEOUS) ×3 IMPLANT
SPONGE GAUZE 4X4 12PLY STER LF (GAUZE/BANDAGES/DRESSINGS) ×3 IMPLANT
SUT BONE WAX W31G (SUTURE) ×3 IMPLANT
SUT E-PACK MINIMALLY INVASIVE (SUTURE) ×3 IMPLANT
SUT ETHIBOND (SUTURE) ×6 IMPLANT
SUT ETHIBOND 2 0 SH (SUTURE) ×6 IMPLANT
SUT ETHIBOND 2-0 RB-1 WHT (SUTURE) ×6 IMPLANT
SUT ETHIBOND X763 2 0 SH 1 (SUTURE) ×3 IMPLANT
SUT GORETEX CV 4 TH 22 36 (SUTURE) ×3 IMPLANT
SUT GORETEX CV-5THC-13 36IN (SUTURE) ×39 IMPLANT
SUT GORETEX CV4 TH-18 (SUTURE) ×6 IMPLANT
SUT PROLENE 3 0 SH1 36 (SUTURE) ×12 IMPLANT
SUT PROLENE 4 0 RB 1 (SUTURE) ×8
SUT PROLENE 4-0 RB1 .5 CRCL 36 (SUTURE) ×16 IMPLANT
SUT PTFE CHORD X 20MM (SUTURE) ×3 IMPLANT
SUT SILK  1 MH (SUTURE) ×1
SUT SILK 1 MH (SUTURE) ×2 IMPLANT
SUT SILK 2 0 SH CR/8 (SUTURE) IMPLANT
SUT SILK 3 0 SH CR/8 (SUTURE) IMPLANT
SUT VIC AB 2-0 CTX 36 (SUTURE) IMPLANT
SUT VIC AB 3-0 SH 8-18 (SUTURE) ×3 IMPLANT
SUT VICRYL 2 TP 1 (SUTURE) IMPLANT
SYR 10ML LL (SYRINGE) ×3 IMPLANT
SYSTEM SAHARA CHEST DRAIN ATS (WOUND CARE) ×3 IMPLANT
TOWEL GREEN STERILE (TOWEL DISPOSABLE) ×3 IMPLANT
TOWEL GREEN STERILE FF (TOWEL DISPOSABLE) ×6 IMPLANT
TOWEL OR 17X24 6PK STRL BLUE (TOWEL DISPOSABLE) IMPLANT
TOWEL OR 17X26 10 PK STRL BLUE (TOWEL DISPOSABLE) IMPLANT
TRAY FOLEY SILVER 16FR TEMP (SET/KITS/TRAYS/PACK) ×3 IMPLANT
TROCAR XCEL BLADELESS 5X75MML (TROCAR) ×3 IMPLANT
TROCAR XCEL NON-BLD 11X100MML (ENDOMECHANICALS) ×6 IMPLANT
TUBE SUCT INTRACARD DLP 20F (MISCELLANEOUS) ×3 IMPLANT
TUNNELER SHEATH ON-Q 11GX8 DSP (PAIN MANAGEMENT) IMPLANT
UNDERPAD 30X30 (UNDERPADS AND DIAPERS) ×3 IMPLANT
WATER STERILE IRR 1000ML POUR (IV SOLUTION) ×6 IMPLANT
WIRE .035 3MM-J 145CM (WIRE) ×3 IMPLANT

## 2016-07-17 NOTE — Progress Notes (Signed)
RT note- ETT pulled back 2cm per radiology

## 2016-07-17 NOTE — Progress Notes (Signed)
Left radial arterial line no longer functioning; RT called to trouble shoot; A-line had been positional prior to shift per day RN; during trouble shoot A line ceased to function and does not record an accurate pressure;  MD notified;  MD gave verbal order to DC A-line; use cuff pressures for pressors and obtain gas through arterial sticks during extubation.  Clyda Hurdle RN

## 2016-07-17 NOTE — Anesthesia Preprocedure Evaluation (Signed)
Anesthesia Evaluation  Patient identified by MRN, date of birth, ID band Patient awake    Reviewed: Allergy & Precautions, NPO status , Patient's Chart, lab work & pertinent test results  History of Anesthesia Complications (+) PONV  Airway Mallampati: I  TM Distance: >3 FB Neck ROM: Full    Dental   Pulmonary sleep apnea , COPD,    Pulmonary exam normal        Cardiovascular hypertension, Pt. on medications Normal cardiovascular exam+ Valvular Problems/Murmurs MR      Neuro/Psych    GI/Hepatic GERD  Controlled,  Endo/Other    Renal/GU      Musculoskeletal   Abdominal   Peds  Hematology   Anesthesia Other Findings   Reproductive/Obstetrics                             Anesthesia Physical Anesthesia Plan  ASA: III  Anesthesia Plan: General   Post-op Pain Management:    Induction: Intravenous  Airway Management Planned: Double Lumen EBT  Additional Equipment: Arterial line, CVP, PA Cath, TEE, 3D TEE and Ultrasound Guidance Line Placement  Intra-op Plan:   Post-operative Plan: Post-operative intubation/ventilation  Informed Consent: I have reviewed the patients History and Physical, chart, labs and discussed the procedure including the risks, benefits and alternatives for the proposed anesthesia with the patient or authorized representative who has indicated his/her understanding and acceptance.     Plan Discussed with: CRNA and Surgeon  Anesthesia Plan Comments:         Anesthesia Quick Evaluation

## 2016-07-17 NOTE — Progress Notes (Signed)
RT NOTE:  Rapid Wean initiated.   Pt ALINE positional, dampened waveform, unable to draw back for blood sample. RT will replace. RN will call for new ALINE order.

## 2016-07-17 NOTE — Op Note (Addendum)
CARDIOTHORACIC SURGERY OPERATIVE NOTE  Date of Procedure:  07/17/2016  Preoperative Diagnosis: Severe Mitral Regurgitation  Postoperative Diagnosis: Same  Procedure:    Minimally-Invasive Mitral Valve Repair  Complex valvuloplasty including triangular resection of flail segment (P2) of posterior leaflet  Gore-tex neochord placement x4  Sorin Memo 3D Ring Annuloplasty (size 40mm, catalog # [id number], serial # [id number])  Closure of patent foramen ovale    Surgeon: Valentina Gu. Roxy Manns, MD  Assistant: Nicholes Rough, PA-C  Anesthesia: Lillia Abed, MD  Operative Findings:  Fibroelastic deficiency type myxomatous degenerative disease with flail segment (P2) of posterior leaflet and multiple ruptured chordae tendinae  Type II mitral valve dysfunction with severe mitral regurgitation  Normal LV systolic function  Patent foramen ovale  No residual mitral regurgitation after successful valve repair                           BRIEF CLINICAL NOTE AND INDICATIONS FOR SURGERY  Patient is a 61 year old morbidly obese white female with history of mitral regurgitation caused by mitral valve prolapse, chronic respiratory failure that had previously been attributed to COPD, essential hypertension, and anxiety and depression who has been referred for surgical consultation to discuss treatment options for management of severe symptomatically mitral regurgitation. The patient states that she first began to experience problems with severe exertional shortness of breath 2 or 3 years ago. Symptoms progressed and she has been evaluated on numerous occasions and hospitalized on numerous occasions with acute exacerbation of chronic respiratory failure which have been previously attributed to COPD. The patient is a lifelong nonsmoker although she admits that she had been exposed to some tobacco smoking in the past. She was evaluated by [name] in December and found to have moderate COPD and  some tracheomalacia. [name] felt that the patient's shortness of breath with disproportionate to her pulmonary findings. An echocardiogram was performed demonstrating mitral valve prolapse with severe mitral regurgitation. Left ventricular systolic function was normal. She was referred to [name] who first evaluated the patient on [name]. The patient subsequently underwent TEE and diagnostic cardiac catheterization on 06/16/2016. TEE confirmed the presence of mitral valve prolapse with a large flail segment involving the middle scallop of the posterior leaflet and severe mitral regurgitation. Left ventricular function appeared normal with ejection fraction estimated 60-65%. Left and right heart catheterization confirmed the presence of severe mitral regurgitation. There was no significant coronary artery disease. Left ventricular function appeared normal. There was moderate pulmonary hypertension. The patient wasreferred for surgical consultation. The patient has been seen in consultation and counseled at length regarding the indications, risks and potential benefits of surgery.  All questions have been answered, and the patient provides full informed consent for the operation as described.    DETAILS OF THE OPERATIVE PROCEDURE  Preparation:  The patient is brought to the operating room on the above mentioned date and central monitoring was established by the anesthesia team including placement of Swan-Ganz catheter through the left internal jugular vein.  A radial arterial line is placed. The patient is placed in the supine position on the operating table.  Intravenous antibiotics are administered. General endotracheal anesthesia is induced uneventfully. The patient is initially intubated using a dual lumen endotracheal tube.  A Foley catheter is placed.  Baseline transesophageal echocardiogram was performed.  Findings were notable for myxomatous degenerative disease with an obvious  flail segment involving the middle scallop of the posterior leaflet of the mitral valve  with ruptured chordae tendineae. There was severe mitral regurgitation. There was normal left ventricular systolic function with mild left ventricular hypertrophy. There was trace central aortic insufficiency. There was mild tricuspid regurgitation. There was a patent foramen ovale.  A soft roll is placed behind the patient's left scapula and the neck gently extended and turned to the left.   The patient's right neck, chest, abdomen, both groins, and both lower extremities are prepared and draped in a sterile manner. A time out procedure is performed.  Surgical Approach:  A right miniature anterolateral thoracotomy incision is performed. The incision is placed just lateral to and superior to the right nipple. The pectoralis major muscle is retracted medially and completely preserved. The right pleural space is entered through the 3rd intercostal space. A soft tissue retractor is placed.  Two 11 mm ports are placed through separate stab incisions inferiorly. The right pleural space is insufflated continuously with carbon dioxide gas through the posterior port during the remainder of the operation.  A pledgeted sutures placed through the dome of the right hemidiaphragm and retracted inferiorly to facilitate exposure.  A longitudinal incision is made in the pericardium 3 cm anterior to the phrenic nerve and silk traction sutures are placed on either side of the incision for exposure.   Extracorporeal Cardiopulmonary Bypass and Myocardial Protection:  A small incision is made in the right inguinal crease and the anterior surface of the right common femoral artery and right common femoral vein are identified.  The patient is placed in Trendelenburg position. The right internal jugular vein is cannulated with Seldinger technique and a guidewire advanced into the right atrium. The patient is heparinized systemically. The  right internal jugular vein is cannulated with a 14 [place] pediatric femoral venous cannula. Pursestring sutures are placed on the anterior surface of the right common femoral vein and right common femoral artery. The right common femoral vein is cannulated with the Seldinger technique and a guidewire is advanced under transesophageal echocardiogram guidance through the right atrium. The femoral vein is cannulated with a long 22 French femoral venous cannula. The right common femoral artery is cannulated with Seldinger technique and a flexible guidewire is advanced until it can be appreciated intraluminally in the descending thoracic aorta on transesophageal echocardiogram. The femoral artery is cannulated with an 18 French femoral arterial cannula.  Adequate heparinization is verified.     The entire pre-bypass portion of the operation was notable for stable hemodynamics.  Cardiopulmonary bypass was begun.  Vacuum assist venous drainage is utilized. The incision in the pericardium is extended in both directions. Venous drainage and exposure are notably excellent. A retrograde cardioplegia cannula is placed through the right atrium into the coronary sinus using transesophageal echocardiogram guidance.  An antegrade cardioplegia cannula is placed in the ascending aorta.    The patient is cooled to 28C systemic temperature.  The aortic cross clamp is applied and cardioplegia is delivered initially in an antegrade fashion through the aortic root using modified [place] cold blood cardioplegia (KBC protocol).   The initial cardioplegic arrest is rapid with early diastolic arrest.  A single repeat dose of cardioplegia was administered at 80 minutes through the coronary sinus catheter in order to maintain completely flat electrocardiogram.  Myocardial protection was felt to be excellent.   Closure of Patent Foramen Ovale:  A left atriotomy incision was performed through the interatrial groove and extended  partially across the back wall of the left atrium after opening the oblique sinus inferiorly.  There was an obvious patent foramen ovale with blood flowing from the right atrium into the left atrium. The patent foramen ovale was closed using a simple 2 layer closure of running 4-0 Prolene suture.   Mitral Valve Repair:  The mitral valve is exposed using a self-retaining retractor.  The mitral valve was inspected and notable for classicalfibroelastic deficiency type myxomatous degenerative disease with prolapse involving the middle scallop (P2) of the posterior leaflet. There were several ruptured primary chordae tendineae, each emanating from the anterior papillary muscle the remainder of the mitral valve appeared essentially normal. There was no calcification.  Interrupted 2-0 Ethibond horizontal mattress sutures are placed circumferentially around the entire mitral valve annulus. The sutures will ultimately be utilized for ring annuloplasty, and at this juncture there are utilized to suspend the valve symmetrically.  The flail segment of the posterior leaflet was repaired using a simple triangular resection. Less than 15% of the total surface area of P2 was resected. The intervening vertical defect in P2 was closed using interrupted everting simple CV 5 Gore-tex suture.  Artificial neochord placement was performed using Chord-X multi-strand CV-4 Goretex pre-measured loops.  The appropriate cord length was measured from corresponding normal length primary cords from the P3 segment of the posterior leaflet. The papillary muscle suture of the Chord-X multi-strand suture was placed through the head of the anterior papillary muscle in a horizontal mattress fashion and tied over Teflon felt pledgets. Two of the three pre-measured loops were then reimplanted into the free margin of the P2 segment of the posterior leaflet.  The third premeasured loop was discarded.  The valve was tested with saline and appeared  competent even without ring annuloplasty complete. There was a gap between the P2 and peak 3 segment that was closed using a pair of erupted everting CV 5 Gore-Tex suture.  The valve was sized to a 26 mm annuloplasty ring, based upon the transverse distance between the left and right commissures and the height of the anterior leaflet, corresponding to a size just slightly larger than the overall surface area of the anterior leaflet.  A Sorin Memo 3D annuloplasty ring (size 15mm, catalog [id number], serial [id number]) was secured in place uneventfully. All ring sutures were secured using a Cor-knot device.  The valve was tested with saline and appeared competent. There is no residual leak. There was a broad, symmetrical line of coaptation of the anterior and posterior leaflet which was confirmed using the blue ink test.  Rewarming is begun.   Procedure Completion:  The atriotomy was closed using a 2-layer closure of running 3-0 Prolene suture after placing a sump drain across the mitral valve to serve as a left ventricular vent.  One final dose of warm retrograde "reanimation dose" of blood cardioplegia was administered retrograde through the coronary sinus catheter while all air was evacuated through the aortic root.  The aortic cross clamp was removed after a total cross clamp time of 118 minutes.  Epicardial pacing wires are fixed to the inferior wall of the right ventricule and to the right atrial appendage. The patient is rewarmed to 37C temperature. The left ventricular vent is removed.  The patient is ventilated and flow volumes turndown while the mitral valve repair is inspected using transesophageal echocardiogram. The valve repair appears intact with no residual leak. The antegrade cardioplegia cannula is now removed. The patient is weaned and disconnected from cardiopulmonary bypass.  The patient's rhythm at separation from bypass was sinus.  The patient was weaned from  bypass without any inotropic  support. Total cardiopulmonary bypass time for the operation was 174 minutes.  Followup transesophageal echocardiogram performed after separation from bypass revealed a well-seated annuloplasty ring in the mitral position with a normal functioning mitral valve. There was no residual leak.  Left ventricular function was unchanged from preoperatively.  The mean gradient across the mitral valve was estimated to be 3 mmHg.  The femoral arterial and venous cannulae were removed uneventfully. There was a palpable pulse in the distal right common femoral artery after removal of the cannula. Protamine was administered to reverse the anticoagulation. The right internal jugular cannula was removed and manual pressure held on the neck for 15 minutes.  Single lung ventilation was begun. The atriotomy closure was inspected for hemostasis. The pericardial sac was drained using a 28 French Bard drain placed through the anterior port incision.  The pericardium was closed using a patch of core matrix bovine submucosal tissue patch. The right pleural space is irrigated with saline solution and inspected for hemostasis. The right pleural space was drained using a 28 French Bard drain placed through the posterior port incision. The miniature thoracotomy incision was closed in multiple layers in routine fashion. The right groin incision was inspected for hemostasis and closed in multiple layers in routine fashion.  The post-bypass portion of the operation was notable for stable rhythm and hemodynamics.  No blood products were administered during the operation.   Disposition:  The patient tolerated the procedure well.  The patient was reintubated using a single lumen endotracheal tube and subsequently transported to the surgical intensive care unit in stable condition. There were no intraoperative complications. All sponge instrument and needle counts are verified correct at completion of the operation.     Valentina Gu.  Roxy Manns MD 07/17/2016 1:55 PM

## 2016-07-17 NOTE — Anesthesia Procedure Notes (Addendum)
Procedure Name: Intubation Date/Time: 07/17/2016 7:57 AM Performed by: Merdis Delay Pre-anesthesia Checklist: Patient identified, Emergency Drugs available, Suction available, Patient being monitored and Timeout performed Patient Re-evaluated:Patient Re-evaluated prior to inductionOxygen Delivery Method: Circle system utilized Preoxygenation: Pre-oxygenation with 100% oxygen Intubation Type: IV induction Ventilation: Mask ventilation with difficulty and Oral airway inserted - appropriate to patient size Laryngoscope Size: Mac and 3 Grade View: Grade II Tube type: Oral Endobronchial tube: Left and Double lumen EBT Number of attempts: 1 Airway Equipment and Method: Stylet and Fiberoptic brochoscope Placement Confirmation: ETT inserted through vocal cords under direct vision,  positive ETCO2 and breath sounds checked- equal and bilateral Tube secured with: Tape Dental Injury: Teeth and Oropharynx as per pre-operative assessment  Comments: Performed by Freada Bergeron srna

## 2016-07-17 NOTE — Transfer of Care (Signed)
Immediate Anesthesia Transfer of Care Note  Patient: Lauren Bartlett  Procedure(s) Performed: Procedure(s): MINIMALLY INVASIVE MITRAL VALVE REPAIR (MVR) USING 26 SORIN MEMO 3D ANNULOPLASTY RING (Right) TRANSESOPHAGEAL ECHOCARDIOGRAM (TEE) (N/A) PATENT FORAMEN OVALE (PFO) CLOSURE (N/A)  Patient Location: SICU  Anesthesia Type:General  Level of Consciousness: sedated and Patient remains intubated per anesthesia plan  Airway & Oxygen Therapy: Patient remains intubated per anesthesia plan and Patient placed on Ventilator (see vital sign flow sheet for setting)  Post-op Assessment: Report given to RN and Post -op Vital signs reviewed and stable  Post vital signs: Reviewed and stable  Last Vitals:  Vitals:   07/17/16 0549  BP: (!) 151/69  Pulse: 88  Temp: 36.7 C    Last Pain:  Vitals:   07/17/16 0549  TempSrc: Oral         Complications: No apparent anesthesia complications    Pt VSS during transport to ICU. Pt met my ICU RN and RT. Report given to RN/RT and all questions answered. Pt connected to ICU monitors and continued stability confirmed.    BP 110/60. SPO2 100.

## 2016-07-17 NOTE — Progress Notes (Signed)
Patient ID: Lauren Bartlett, female   DOB: 16-Jul-1955, 61 y.o.   MRN: [id number]  SICU Evening Rounds:   Hemodynamically stable, paced  CI = 2.5 on no inotropes  Has started to wake up on vent.    Urine output good  CT output low  CBC    Component Value Date/Time   WBC 20.1 (H) 07/17/2016 1440   RBC 3.69 (L) 07/17/2016 1440   HGB 10.5 (L) 07/17/2016 1441   HGB 11.6 (L) 03/11/2013 0500   HCT 31.0 (L) 07/17/2016 1441   HCT 36.5 05/13/2016 1442   PLT 144 (L) 07/17/2016 1440   PLT 190 05/13/2016 1442   MCV 88.9 07/17/2016 1440   MCV 89 05/13/2016 1442   MCV 86 03/11/2013 0500   MCH 27.9 07/17/2016 1440   MCHC 31.4 07/17/2016 1440   RDW 14.6 07/17/2016 1440   RDW 14.4 05/13/2016 1442   RDW 14.2 03/11/2013 0500   LYMPHSABS 1.4 01/04/2016 1333   LYMPHSABS 0.5 (L) 03/11/2013 0500   MONOABS 0.5 01/04/2016 1333   MONOABS 0.3 03/11/2013 0500   EOSABS 0.3 01/04/2016 1333   EOSABS 0.0 03/11/2013 0500   BASOSABS 0.0 01/04/2016 1333   BASOSABS 0.0 03/11/2013 0500     BMET    Component Value Date/Time   NA 141 07/17/2016 1441   NA 143 05/13/2016 1442   NA 137 03/13/2013 0347   K 3.9 07/17/2016 1441   K 4.0 03/13/2013 0347   CL 106 07/17/2016 1309   CL 102 03/13/2013 0347   CO2 25 07/14/2016 1618   CO2 31 03/13/2013 0347   GLUCOSE 103 (H) 07/17/2016 1441   GLUCOSE 130 (H) 03/13/2013 0347   BUN 18 07/17/2016 1309   BUN 18 05/13/2016 1442   BUN 25 (H) 03/13/2013 0347   CREATININE 0.70 07/17/2016 1309   CREATININE 0.77 03/13/2013 0347   CREATININE 0.70 08/04/2011 1116   CALCIUM 8.4 (L) 07/14/2016 1618   CALCIUM 8.1 (L) 03/13/2013 0347   GFRNONAA >60 07/14/2016 1618   GFRNONAA >60 03/13/2013 0347   GFRNONAA >89 08/04/2011 1116   GFRAA >60 07/14/2016 1618   GFRAA >60 03/13/2013 0347   GFRAA >89 08/04/2011 1116     A/P:  Stable postop course. Continue current plans

## 2016-07-17 NOTE — Anesthesia Postprocedure Evaluation (Signed)
Anesthesia Post Note  Patient: Lauren Bartlett  Procedure(s) Performed: Procedure(s) (LRB): MINIMALLY INVASIVE MITRAL VALVE REPAIR (MVR) USING 26 SORIN MEMO 3D ANNULOPLASTY RING (Right) TRANSESOPHAGEAL ECHOCARDIOGRAM (TEE) (N/A) PATENT FORAMEN OVALE (PFO) CLOSURE (N/A)  Patient location during evaluation: SICU Anesthesia Type: General Level of consciousness: sedated Pain management: pain level controlled Vital Signs Assessment: post-procedure vital signs reviewed and stable Respiratory status: patient remains intubated per anesthesia plan Cardiovascular status: stable Anesthetic complications: no       Last Vitals:  Vitals:   07/17/16 1530 07/17/16 1536  BP:    Pulse: 90 83  Resp: 12 12  Temp: (!) 34.9 C (!) 35 C    Last Pain:  Vitals:   07/17/16 0549  TempSrc: Oral                 Robbin Escher DAVID

## 2016-07-17 NOTE — Brief Op Note (Signed)
07/17/2016  1:49 PM  PATIENT:  Lauren Bartlett  61 y.o. female  PRE-OPERATIVE DIAGNOSIS:  MR  POST-OPERATIVE DIAGNOSIS:  MR  PROCEDURE:  Procedure(s): MINIMALLY INVASIVE MITRAL VALVE REPAIR (MVR) USING 26 SORIN MEMO 3D ANNULOPLASTY RING (Right) TRANSESOPHAGEAL ECHOCARDIOGRAM (TEE) (N/A) PATENT FORAMEN OVALE (PFO) CLOSURE (N/A)  SURGEON:    Rexene Alberts, MD  ASSISTANTS:  Nicholes Rough, PA-C  ANESTHESIA:   Lillia Abed, MD  CROSSCLAMP TIME:   118'  CARDIOPULMONARY BYPASS TIME: 174'  FINDINGS:  Fibroelastic deficiency type myxomatous degenerative disease with flail segment (P2) of posterior leaflet and multiple ruptured chordae tendinae  Type II mitral valve dysfunction with severe mitral regurgitation  Normal LV systolic function  Patent foramen ovale  No residual mitral regurgitation after successful valve repair  COMPLICATIONS: None  BASELINE WEIGHT: 99 kg  PATIENT DISPOSITION:   TO SICU IN STABLE CONDITION  Rexene Alberts, MD 07/17/2016 1:49 PM

## 2016-07-17 NOTE — Progress Notes (Signed)
RT NOTE:  RT attempted Cardiac wean. Pt continues to have apnea periods. RT will attempt again.

## 2016-07-17 NOTE — Progress Notes (Signed)
RT NOTE:  CPAP/PS initiated 

## 2016-07-17 NOTE — Progress Notes (Signed)
Pt states she took Claritin-D (has pseudoephedrine) yesterday. Dr. Kalman Shan notified and note left on consent form/chart for MD.

## 2016-07-17 NOTE — Interval H&P Note (Signed)
History and Physical Interval Note:  07/17/2016 6:13 AM  Lauren Bartlett  has presented today for surgery, with the diagnosis of MR  The various methods of treatment have been discussed with the patient and family. After consideration of risks, benefits and other options for treatment, the patient has consented to  Procedure(s): MINIMALLY INVASIVE MITRAL VALVE REPAIR (MVR) (Right) TRANSESOPHAGEAL ECHOCARDIOGRAM (TEE) (N/A) as a surgical intervention .  The patient's history has been reviewed, patient examined, no change in status, stable for surgery.  I have reviewed the patient's chart and labs.  Questions were answered to the patient's satisfaction.     Rexene Alberts

## 2016-07-17 NOTE — Progress Notes (Signed)
RT dropped patients rate to 4 on ventilator, however, patient still too sleepy and only breaths when noise is made or patient is being talked to. Placed patient back on set rate of 12 and will reassess in 30 minutes. RN aware.

## 2016-07-17 NOTE — Anesthesia Procedure Notes (Signed)
Procedure Name: Intubation Date/Time: 07/17/2016 2:11 PM Performed by: Merdis Delay Pre-anesthesia Checklist: Patient identified, Emergency Drugs available, Suction available, Patient being monitored and Timeout performed Patient Re-evaluated:Patient Re-evaluated prior to inductionOxygen Delivery Method: Circle system utilized Preoxygenation: Pre-oxygenation with 100% oxygen Intubation Type: Inhalational induction Laryngoscope Size: Glidescope and 3 Tube type: Oral Tube size: 7.5 mm Number of attempts: 1 Airway Equipment and Method: Stylet,  Video-laryngoscopy and Bougie stylet Placement Confirmation: ETT inserted through vocal cords under direct vision,  positive ETCO2 and breath sounds checked- equal and bilateral Secured at: 24 cm Tube secured with: Tape Dental Injury: Teeth and Oropharynx as per pre-operative assessment

## 2016-07-17 NOTE — Progress Notes (Signed)
  Echocardiogram Echocardiogram Transesophageal has been performed.  Bobbye Charleston 07/17/2016, 8:54 AM

## 2016-07-17 NOTE — Progress Notes (Signed)
Cardiac Output reading is not working correctly; has been this way through case on and off and MD is aware per Day RN;  Luiz Blare still in appropriate position as when last charted/assessed. When reading properly CI has been >2 throughout day shift.  Will continue to monitor  Clyda Hurdle RN

## 2016-07-17 NOTE — Progress Notes (Addendum)
Attempted to wean patient at 1730-patient was to sleepy-unable to initiate enough bmp on own, precedex off at 1730 (0.2 prior to wean), attempted 2nd wean at 1840-patient still unable to initiate enough bmp, will attempt to wean again at 1900.  Last versed given 1248-1 mg, 2mg  given at 1204 Rocuronium given at 1345-30mg   Derry Kassel, Marsh Dolly, RN

## 2016-07-17 NOTE — OR Nursing (Signed)
Forty-five minute call to SICU charge nurse at 1319. Spoke to Am[place].  Twenty minute call at 1338.

## 2016-07-17 NOTE — Care Management Note (Signed)
Case Management Note  Patient Details  Name: Lauren Bartlett MRN: [id number] Date of Birth: [birth year]  Subjective/Objective:  From home alone s/p MVR repair and PFO closure.                  Action/Plan: PTA home alone, has several family members who live nearby.  NCM will follow for dc needs.  Expected Discharge Date:                  Expected Discharge Plan:     In-House Referral:     Discharge planning Services  CM Consult  Post Acute Care Choice:    Choice offered to:     DME Arranged:    DME Agency:     HH Arranged:    HH Agency:     Status of Service:  In process, will continue to follow  If discussed at Long Length of Stay Meetings, dates discussed:    Additional Comments:  Zenon Mayo, RN 07/17/2016, 3:46 PM

## 2016-07-17 NOTE — Progress Notes (Signed)
RT NOTE:  Rapid Wean terminated due to low RR & low MVe. Pt still remains very sleepy. Will attempt again @ later time.

## 2016-07-17 NOTE — Anesthesia Procedure Notes (Signed)
Central Venous Catheter Insertion Performed by: Myrtie Soman, anesthesiologist Start/End4/19/2018 7:23 AM, 07/17/2016 7:23 AM Patient location: Pre-op. Preanesthetic checklist: patient identified, IV checked, site marked, risks and benefits discussed, surgical consent, monitors and equipment checked, pre-op evaluation, timeout performed and anesthesia consent Position: Trendelenburg Lidocaine 1% used for infiltration Hand hygiene performed  and maximum sterile barriers used  Catheter size: 8.5 Fr PA cath was placed.Swan type:thermodilution PA Cath depth:40 Procedure performed using ultrasound guided technique. Ultrasound Notes:anatomy identified, needle tip was noted to be adjacent to the nerve/plexus identified, no ultrasound evidence of intravascular and/or intraneural injection and image(s) printed for medical record Attempts: 1 Following insertion, line sutured, dressing applied and Biopatch. Post procedure assessment: blood return through all ports, free fluid flow and no air  Patient tolerated the procedure well with no immediate complications.

## 2016-07-18 ENCOUNTER — Encounter (HOSPITAL_COMMUNITY): Payer: Self-pay | Admitting: Thoracic Surgery (Cardiothoracic Vascular Surgery)

## 2016-07-18 ENCOUNTER — Inpatient Hospital Stay (HOSPITAL_COMMUNITY): Payer: Medicare Other

## 2016-07-18 LAB — BASIC METABOLIC PANEL
ANION GAP: 7 (ref 5–15)
BUN: 12 mg/dL (ref 6–20)
CHLORIDE: 110 mmol/L (ref 101–111)
CO2: 22 mmol/L (ref 22–32)
Calcium: 6.5 mg/dL — ABNORMAL LOW (ref 8.9–10.3)
Creatinine, Ser: 0.73 mg/dL (ref 0.44–1.00)
GFR calc non Af Amer: >60 mL/min (ref >60)
Glucose, Bld: 130 mg/dL — ABNORMAL HIGH (ref 65–99)
POTASSIUM: 3.9 mmol/L (ref 3.5–5.1)
Sodium: 139 mmol/L (ref 135–145)

## 2016-07-18 LAB — POCT I-STAT 3, ART BLOOD GAS (G3+)
Acid-base deficit: 2 mmol/L (ref 0.0–2.0)
Acid-base deficit: 3 mmol/L — ABNORMAL HIGH (ref 0.0–2.0)
Bicarbonate: 22.2 mmol/L (ref 20.0–28.0)
Bicarbonate: 23 mmol/L (ref 20.0–28.0)
O2 Saturation: 96 %
O2 Saturation: 98 %
PCO2 ART: 39.3 mmHg (ref 32.0–48.0)
PCO2 ART: 40.3 mmHg (ref 32.0–48.0)
PO2 ART: 84 mmHg (ref 83.0–108.0)
Patient temperature: 36.7
Patient temperature: 36.8
TCO2: 23 mmol/L (ref 0–100)
TCO2: 24 mmol/L (ref 0–100)
pH, Arterial: 7.348 — ABNORMAL LOW (ref 7.350–7.450)
pH, Arterial: 7.374 (ref 7.350–7.450)
pO2, Arterial: 109 mmHg — ABNORMAL HIGH (ref 83.0–108.0)

## 2016-07-18 LAB — GLUCOSE, CAPILLARY
GLUCOSE-CAPILLARY: 97 mg/dL (ref 65–99)
GLUCOSE-CAPILLARY: 109 mg/dL — AB (ref 65–99)
GLUCOSE-CAPILLARY: 112 mg/dL — AB (ref 65–99)
Glucose-Capillary: 115 mg/dL — ABNORMAL HIGH (ref 65–99)
Glucose-Capillary: 111 mg/dL — ABNORMAL HIGH (ref 65–99)
Glucose-Capillary: 122 mg/dL — ABNORMAL HIGH (ref 65–99)
Glucose-Capillary: 103 mg/dL — ABNORMAL HIGH (ref 65–99)

## 2016-07-18 LAB — MAGNESIUM
MAGNESIUM: 2.3 mg/dL (ref 1.7–2.4)
Magnesium: 2.5 mg/dL — ABNORMAL HIGH (ref 1.7–2.4)

## 2016-07-18 LAB — CBC
HEMATOCRIT: 28.4 % — AB (ref 36.0–46.0)
HEMATOCRIT: 31.7 % — AB (ref 36.0–46.0)
HEMOGLOBIN: 8.9 g/dL — AB (ref 12.0–15.0)
HEMOGLOBIN: 10.3 g/dL — AB (ref 12.0–15.0)
MCH: 27.8 pg (ref 26.0–34.0)
MCH: 29.3 pg (ref 26.0–34.0)
MCHC: 31.3 g/dL (ref 30.0–36.0)
MCHC: 32.5 g/dL (ref 30.0–36.0)
MCV: 88.8 fL (ref 78.0–100.0)
MCV: 90.3 fL (ref 78.0–100.0)
Platelets: 132 10*3/uL — ABNORMAL LOW (ref 150–400)
Platelets: 135 10*3/uL — ABNORMAL LOW (ref 150–400)
RBC: 3.2 MIL/uL — AB (ref 3.87–5.11)
RBC: 3.51 MIL/uL — ABNORMAL LOW (ref 3.87–5.11)
RDW: 14.9 % (ref 11.5–15.5)
RDW: 15.6 % — ABNORMAL HIGH (ref 11.5–15.5)
WBC: 14.6 10*3/uL — AB (ref 4.0–10.5)
WBC: 19.4 10*3/uL — AB (ref 4.0–10.5)

## 2016-07-18 LAB — BPAM RBC
BLOOD PRODUCT EXPIRATION DATE: 201805102359
Blood Product Expiration Date: 201805102359
ISSUE DATE / TIME: 201804190948
ISSUE DATE / TIME: 201804190948
UNIT TYPE AND RH: 6200
UNIT TYPE AND RH: 6200

## 2016-07-18 LAB — TYPE AND SCREEN
ABO/RH(D): A POS
Antibody Screen: NEGATIVE
UNIT DIVISION: 0
UNIT DIVISION: 0

## 2016-07-18 LAB — CREATININE, SERUM
Creatinine, Ser: 1.01 mg/dL — ABNORMAL HIGH (ref 0.44–1.00)
GFR, EST NON AFRICAN AMERICAN: 59 mL/min — AB (ref >60)

## 2016-07-18 MED ORDER — NYSTATIN-TRIAMCINOLONE 100000-0.1 UNIT/GM-% EX CREA
TOPICAL_CREAM | Freq: Two times a day (BID) | CUTANEOUS | Status: DC
  Administered 2016-07-18 – 2016-07-23 (×7): via TOPICAL
  Filled 2016-07-18: qty 15
  Filled 2016-07-18: qty 30
  Filled 2016-07-18: qty 15

## 2016-07-18 MED ORDER — INSULIN ASPART 100 UNIT/ML ~~LOC~~ SOLN
0.0000 [IU] | SUBCUTANEOUS | Status: DC
  Administered 2016-07-18: 2 [IU] via SUBCUTANEOUS

## 2016-07-18 MED ORDER — FUROSEMIDE 10 MG/ML IJ SOLN: 40.0000 mg | Freq: Two times a day (BID) | INTRAMUSCULAR | Status: DC

## 2016-07-18 MED ORDER — ENOXAPARIN SODIUM 30 MG/0.3ML ~~LOC~~ SOLN
30.0000 mg | SUBCUTANEOUS | Status: DC
  Administered 2016-07-19 – 2016-07-23 (×5): 30 mg via SUBCUTANEOUS
  Filled 2016-07-18 (×5): qty 0.3

## 2016-07-18 MED ORDER — WARFARIN SODIUM 2.5 MG PO TABS
2.5000 mg | ORAL_TABLET | Freq: Every day | ORAL | Status: DC
  Administered 2016-07-18 – 2016-07-19 (×2): 2.5 mg via ORAL
  Filled 2016-07-18 (×2): qty 1

## 2016-07-18 MED ORDER — FUROSEMIDE 10 MG/ML IJ SOLN: 40.0000 mg | Freq: Once | INTRAMUSCULAR | Status: DC

## 2016-07-18 MED ORDER — INSULIN ASPART 100 UNIT/ML ~~LOC~~ SOLN
0.0000 [IU] | SUBCUTANEOUS | Status: DC
  Administered 2016-07-19: 2 [IU] via SUBCUTANEOUS

## 2016-07-18 MED ORDER — LEVOTHYROXINE SODIUM 112 MCG PO TABS
112.0000 ug | ORAL_TABLET | Freq: Every day | ORAL | Status: DC
  Administered 2016-07-18 – 2016-07-23 (×6): 112 ug via ORAL
  Filled 2016-07-18 (×6): qty 1

## 2016-07-18 MED ORDER — POTASSIUM CHLORIDE CRYS ER 20 MEQ PO TBCR
40.0000 meq | EXTENDED_RELEASE_TABLET | Freq: Every day | ORAL | Status: DC
  Administered 2016-07-18 – 2016-07-19 (×2): 40 meq via ORAL
  Filled 2016-07-18 (×2): qty 2

## 2016-07-18 MED ORDER — IPRATROPIUM-ALBUTEROL 0.5-2.5 (3) MG/3ML IN SOLN
3.0000 mL | Freq: Four times a day (QID) | RESPIRATORY_TRACT | Status: DC | PRN
  Administered 2016-07-18 – 2016-07-22 (×5): 3 mL via RESPIRATORY_TRACT
  Filled 2016-07-18 (×5): qty 3

## 2016-07-18 MED ORDER — FUROSEMIDE 10 MG/ML IJ SOLN
20.0000 mg | Freq: Four times a day (QID) | INTRAMUSCULAR | Status: AC
  Administered 2016-07-18 (×3): 20 mg via INTRAVENOUS
  Filled 2016-07-18 (×3): qty 2

## 2016-07-18 MED ORDER — METOCLOPRAMIDE HCL 5 MG/ML IJ SOLN
10.0000 mg | Freq: Four times a day (QID) | INTRAMUSCULAR | Status: DC | PRN
  Administered 2016-07-18 – 2016-07-21 (×6): 10 mg via INTRAVENOUS
  Filled 2016-07-18 (×4): qty 2

## 2016-07-18 MED ORDER — AMIODARONE HCL 200 MG PO TABS
200.0000 mg | ORAL_TABLET | Freq: Every day | ORAL | Status: DC
  Administered 2016-07-19 – 2016-07-20 (×2): 200 mg via ORAL
  Filled 2016-07-18 (×2): qty 1

## 2016-07-18 MED ORDER — MONTELUKAST SODIUM 10 MG PO TABS
10.0000 mg | ORAL_TABLET | Freq: Every day | ORAL | Status: DC
  Administered 2016-07-18 – 2016-07-22 (×5): 10 mg via ORAL
  Filled 2016-07-18 (×5): qty 1

## 2016-07-18 MED ORDER — BUDESONIDE 0.25 MG/2ML IN SUSP
0.2500 mg | Freq: Two times a day (BID) | RESPIRATORY_TRACT | Status: DC
  Administered 2016-07-18 – 2016-07-23 (×9): 0.25 mg via RESPIRATORY_TRACT
  Filled 2016-07-18 (×9): qty 2

## 2016-07-18 MED ORDER — WARFARIN - PHYSICIAN DOSING INPATIENT
Freq: Every day | Status: DC
  Administered 2016-07-20 – 2016-07-22 (×3)

## 2016-07-18 MED FILL — Electrolyte-R (PH 7.4) Solution: INTRAVENOUS | Qty: 1000 | Status: AC

## 2016-07-18 MED FILL — Sodium Bicarbonate IV Soln 8.4%: INTRAVENOUS | Qty: 50 | Status: AC

## 2016-07-18 MED FILL — Sodium Chloride IV Soln 0.9%: INTRAVENOUS | Qty: 2000 | Status: AC

## 2016-07-18 MED FILL — Heparin Sodium (Porcine) Inj 1000 Unit/ML: INTRAMUSCULAR | Qty: 10 | Status: AC

## 2016-07-18 MED FILL — Lidocaine HCl IV Inj 20 MG/ML: INTRAVENOUS | Qty: 5 | Status: AC

## 2016-07-18 MED FILL — Mannitol IV Soln 20%: INTRAVENOUS | Qty: 1000 | Status: AC

## 2016-07-18 NOTE — Plan of Care (Signed)
Problem: Respiratory: Goal: Respiratory status will improve Outcome: Progressing IS marginally improving

## 2016-07-18 NOTE — Procedures (Signed)
Extubation Procedure Note Pt completed Rapid Wean w/o complication. ABG normal, VC 600, NIF -30, cuff leak present. Pt extubated to 2L [place]. Cough/clears secretions. Pt speaks name/location. RT will monitor.   Patient Details:   Name: Lauren Bartlett DOB: 1955/11/10 MRN: [id number]   Airway Documentation:     Evaluation  O2 sats: stable throughout Complications: No apparent complications Patient did tolerate procedure well. Bilateral Breath Sounds: Clear, Diminished   Yes  Sharen Hint 07/18/2016, 12:30 AM

## 2016-07-18 NOTE — Progress Notes (Addendum)
TCTS DAILY ICU PROGRESS NOTE                   [address]            [place], [address]          [phone]   1 Day Post-Op Procedure(s) (LRB): MINIMALLY INVASIVE MITRAL VALVE REPAIR (MVR) USING 26 SORIN MEMO 3D ANNULOPLASTY RING (Right) TRANSESOPHAGEAL ECHOCARDIOGRAM (TEE) (N/A) PATENT FORAMEN OVALE (PFO) CLOSURE (N/A)  Total Length of Stay:  LOS: 1 day   Subjective:  Some pain at chest tube sites.  Feeling a little better this morning.  Denies N/V  + flatus,  NO BM  Objective: Vital signs in last 24 hours: Temp:  [94.1 F (34.5 C)-98.4 F (36.9 C)] 98 F (36.7 C) (04/20 0735) Pulse Rate:  [75-92] 92 (04/20 0700) Cardiac Rhythm: Normal sinus rhythm (04/20 0400) Resp:  [11-23] 13 (04/20 0700) BP: (80-117)/(46-68) 107/59 (04/20 0700) SpO2:  [92 %-100 %] 100 % (04/20 0700) Arterial Line BP: (79-153)/(53-101) 79/73 (04/19 2345) FiO2 (%):  [40 %-50 %] 40 % (04/19 2345) Weight:  [231 lb (104.8 kg)] 231 lb (104.8 kg) (04/20 0545)  Filed Weights   07/17/16 0549 07/18/16 0545  Weight: 218 lb (98.9 kg) 231 lb (104.8 kg)    Weight change: 13 lb (5.897 kg)   Hemodynamic parameters for last 24 hours: PAP: (27-56)/(15-29) 37/19 CO:  [4.5 L/min-5.5 L/min] 4.9 L/min CI:  [2.3 L/min/m2-2.8 L/min/m2] 2.5 L/min/m2  Intake/Output from previous day: 04/19 0701 - 04/20 0700 In: 6453.7 [I.V.:4673.7; Blood:485; NG/GT:30; IV Piggyback:1265] Out: 8185 [Urine:1770; Blood:1625; Chest Tube:460]  Intake/Output this shift: No intake/output data recorded.  Current Meds: Scheduled Meds: . acetaminophen  1,000 mg Oral Q6H   Or  . acetaminophen (TYLENOL) oral liquid 160 mg/5 mL  1,000 mg Per Tube Q6H  . aspirin EC  325 mg Oral Daily   Or  . aspirin  324 mg Per Tube Daily  . bisacodyl  10 mg Oral Daily   Or  . bisacodyl  10 mg Rectal Daily  . docusate sodium  200 mg Oral Daily  . furosemide  40 mg Intravenous Q12H  . insulin aspart  0-24 Units Subcutaneous Q4H  .  insulin aspart  0-24 Units Subcutaneous Q4H  . ipratropium-albuterol  3 mL Nebulization Q6H  . metoprolol tartrate  12.5 mg Oral BID   Or  . metoprolol tartrate  12.5 mg Per Tube BID  . [START ON 07/19/2016] pantoprazole  40 mg Oral Daily  . potassium chloride  40 mEq Oral Daily  . sodium chloride flush  3 mL Intravenous Q12H  . warfarin  2.5 mg Oral Daily   Continuous Infusions: . sodium chloride 20 mL/hr at 07/18/16 0700  . sodium chloride    . sodium chloride 20 mL/hr at 07/18/16 0700  . cefUROXime (ZINACEF)  IV Stopped (07/18/16 6314)  . lactated ringers    . lactated ringers 20 mL/hr at 07/18/16 0700  . lactated ringers Stopped (07/18/16 0700)   PRN Meds:.sodium chloride, lactated ringers, metoprolol, midazolam, morphine injection, ondansetron (ZOFRAN) IV, oxyCODONE, sodium chloride flush, traMADol  General appearance: alert, cooperative and no distress Heart: regular rate and rhythm Lungs: diminished breath sounds bibasilar Abdomen: soft, non-tender; bowel sounds normal; no masses,  no organomegaly Extremities: edema trace Wound: clean and dry  Lab Results: CBC: Recent Labs  07/17/16 2100 07/18/16 0400  WBC 16.5* 14.6*  HGB 10.2* 8.9*  HCT 31.8* 28.4*  PLT 146*  132*   BMET:  Recent Labs  07/17/16 2021 07/17/16 2100 07/18/16 0400  NA 140  --  139  K 4.3  --  3.9  CL 109  --  110  CO2  --   --  22  GLUCOSE 119*  --  130*  BUN 16  --  12  CREATININE 0.60 0.74 0.73  CALCIUM  --   --  6.5*    CMET: Lab Results  Component Value Date   WBC 14.6 (H) 07/18/2016   HGB 8.9 (L) 07/18/2016   HCT 28.4 (L) 07/18/2016   PLT 132 (L) 07/18/2016   GLUCOSE 130 (H) 07/18/2016   CHOL 173 03/14/2015   TRIG 85.0 03/14/2015   HDL 57.10 03/14/2015   LDLCALC 99 03/14/2015   ALT 14 07/14/2016   AST 17 07/14/2016   NA 139 07/18/2016   K 3.9 07/18/2016   CL 110 07/18/2016   CREATININE 0.73 07/18/2016   BUN 12 07/18/2016   CO2 22 07/18/2016   TSH 1.02 03/14/2015   INR  1.34 07/17/2016   HGBA1C 5.1 07/14/2016      PT/INR:  Recent Labs  07/17/16 1440  LABPROT 16.6*  INR 1.34   Radiology: Dg Chest Port 1 View  Result Date: 07/18/2016 CLINICAL DATA:  Postop atelectasis. EXAM: PORTABLE CHEST 1 VIEW COMPARISON:  07/17/2016 FINDINGS: Endotracheal tube and nasogastric tube have been removed. Right chest tube remains in place. No pneumothorax visualized. Swan-Ganz catheter remains in stable position. Decreased lung volumes are seen with increased bibasilar atelectasis. Heart size remains stable. IMPRESSION: Increased bibasilar atelectasis following extubation. No pneumothorax visualized. Electronically Signed   By: Earle Gell M.D.   On: 07/18/2016 08:09   Dg Chest Port 1 View  Result Date: 07/17/2016 CLINICAL DATA:  Mitral valve regurgitation EXAM: PORTABLE CHEST 1 VIEW COMPARISON:  07/14/2016 FINDINGS: Endotracheal tube tip is in the right mainstem bronchus. It is 2.2 cm below the carina. NG tube in place. Chest and mediastinal tubes in place. Left jugular introducer sheath with a Swan-Ganz catheter in place. Tip is in the mid main pulmonary artery. Lungs are under aerated with scattered atelectasis. Normal vascularity. No sign of interstitial edema are vascular congestion. No pneumothorax. IMPRESSION: Endotracheal tube in the right mainstem bronchus. Postoperative change with scattered atelectasis. Electronically Signed   By: Marybelle Killings M.D.   On: 07/17/2016 15:47     Assessment/Plan: S/P Procedure(s) (LRB): MINIMALLY INVASIVE MITRAL VALVE REPAIR (MVR) USING 26 SORIN MEMO 3D ANNULOPLASTY RING (Right) TRANSESOPHAGEAL ECHOCARDIOGRAM (TEE) (N/A) PATENT FORAMEN OVALE (PFO) CLOSURE (N/A)  1. CV- NSR, off all drips, BP is labile- continue Lopressor, start coumadin at low dose for Mitral Valve Repair 2. Pulm- wean oxygen as tolerated, CT 460 cc output leave in place today, CXR with bilateral atelectasis, small right sided effusion 3. Renal- creatinine WNL,  +hypervolemia, weight is up about 15 lbs, will give IV Lasix 40 mg BID today, supplement K 4. Expected post operative blood loss anemia, moderate, Hgb at 8.9 will watch 5. CBGs, well controlled, off insulin drip, will not order Basal Levemir at this time, continue SSIP for now 6. Dispo- patient stable, maintaining NSR, start coumadin slowly for MV Repair, leave chest tubes in place until output decreases, may be able to transfer to circle later today if makes good progress.     Ellwood Handler 07/18/2016 8:12 AM   I have seen and examined the patient and agree with the assessment and plan as outlined.  Doing very well  POD1.  Maintaining NSR w/ stable hemodynamics, no drips.  Breathing comfortably w/ O2 sats 100% on 2 L/min via [place].  Mobilize.  Diuresis.  D/C lines.  Leave chest tubes in for at least 2 or 3 days until serosanguinous drainage resolves.  Watch anemia.  Start Coumadin slowly.  Low dose lovenox and prophylactic amiodarone to start tomorrow.  Rexene Alberts, MD 07/18/2016 8:21 AM

## 2016-07-18 NOTE — Plan of Care (Signed)
Problem: Pain Management: Goal: Pain level will decrease Outcome: Progressing Discussed concerns that patient had with taking too much pain medication; is willing to take more pain medication as the patient looks visibly in pain  Problem: Fluid Volume: Goal: Ability to maintain a balanced intake and output will improve Outcome: Progressing Lasix started

## 2016-07-18 NOTE — Progress Notes (Signed)
CT surgery p.m. Rounds  Doing well after mitral valve repair Walked approximately 30 feet in hallway and sat in chair Maintaining sinus rhythm White count slightly elevated 19,000-Will follow Complaining of itching on right thigh will apply hydrocortisone

## 2016-07-19 ENCOUNTER — Inpatient Hospital Stay (HOSPITAL_COMMUNITY): Payer: Medicare Other

## 2016-07-19 LAB — GLUCOSE, CAPILLARY
GLUCOSE-CAPILLARY: 102 mg/dL — AB (ref 65–99)
GLUCOSE-CAPILLARY: 103 mg/dL — AB (ref 65–99)
GLUCOSE-CAPILLARY: 108 mg/dL — AB (ref 65–99)
GLUCOSE-CAPILLARY: 118 mg/dL — AB (ref 65–99)
Glucose-Capillary: 126 mg/dL — ABNORMAL HIGH (ref 65–99)
Glucose-Capillary: 113 mg/dL — ABNORMAL HIGH (ref 65–99)
Glucose-Capillary: 112 mg/dL — ABNORMAL HIGH (ref 65–99)

## 2016-07-19 LAB — CBC
HCT: 31.5 % — ABNORMAL LOW (ref 36.0–46.0)
Hemoglobin: 9.8 g/dL — ABNORMAL LOW (ref 12.0–15.0)
MCH: 28.1 pg (ref 26.0–34.0)
MCHC: 31.1 g/dL (ref 30.0–36.0)
MCV: 90.3 fL (ref 78.0–100.0)
Platelets: 136 10*3/uL — ABNORMAL LOW (ref 150–400)
RBC: 3.49 MIL/uL — AB (ref 3.87–5.11)
RDW: 15.6 % — AB (ref 11.5–15.5)
WBC: 17 10*3/uL — AB (ref 4.0–10.5)

## 2016-07-19 LAB — BASIC METABOLIC PANEL
ANION GAP: 9 (ref 5–15)
BUN: 10 mg/dL (ref 6–20)
CALCIUM: 6.9 mg/dL — AB (ref 8.9–10.3)
CO2: 25 mmol/L (ref 22–32)
Chloride: 103 mmol/L (ref 101–111)
Creatinine, Ser: 0.89 mg/dL (ref 0.44–1.00)
GFR calc non Af Amer: >60 mL/min (ref >60)
GLUCOSE: 110 mg/dL — AB (ref 65–99)
POTASSIUM: 4.2 mmol/L (ref 3.5–5.1)
SODIUM: 137 mmol/L (ref 135–145)

## 2016-07-19 LAB — PROTIME-INR
INR: 1.35
PROTHROMBIN TIME: 16.7 s — AB (ref 11.4–15.2)

## 2016-07-19 MED ORDER — FUROSEMIDE 10 MG/ML IJ SOLN
20.0000 mg | Freq: Two times a day (BID) | INTRAMUSCULAR | Status: AC
Start: 2016-07-19 — End: 2016-07-20
  Administered 2016-07-19 – 2016-07-20 (×3): 20 mg via INTRAVENOUS
  Filled 2016-07-19 (×3): qty 2

## 2016-07-19 NOTE — Progress Notes (Signed)
Patient examined and record reviewed.Hemodynamics stable,labs satisfactory.Patient had stable day.Continue current care. Tharon Aquas Trigt III 07/19/2016

## 2016-07-19 NOTE — Progress Notes (Signed)
2 Days Post-Op Procedure(s) (LRB): MINIMALLY INVASIVE MITRAL VALVE REPAIR (MVR) USING 26 SORIN MEMO 3D ANNULOPLASTY RING (Right) TRANSESOPHAGEAL ECHOCARDIOGRAM (TEE) (N/A) PATENT FORAMEN OVALE (PFO) CLOSURE (N/A) Subjective: progressongh Reg HR Feels stronger  Objective: Vital signs in last 24 hours: Temp:  [97.6 F (36.4 C)-98.6 F (37 C)] 97.6 F (36.4 C) (04/21 0700) Pulse Rate:  [82-105] 88 (04/21 0400) Cardiac Rhythm: Normal sinus rhythm (04/21 0400) Resp:  [10-25] 17 (04/21 0400) BP: (93-128)/(53-91) 110/67 (04/21 0400) SpO2:  [98 %-100 %] 99 % (04/21 0400) Weight:  [222 lb 10.6 oz (101 kg)] 222 lb 10.6 oz (101 kg) (04/21 0500)  Hemodynamic parameters for last 24 hours: PAP: (44)/(20) 44/20  Intake/Output from previous day: 04/20 0701 - 04/21 0700 In: 660 [P.O.:100; I.V.:460; IV Piggyback:100] Out: 2990 [Urine:2600; Chest Tube:390] Intake/Output this shift: No intake/output data recorded.       Exam    General- alert and comfortable   Lungs- clear without rales, wheezes   Cor- regular rate and rhythm, no murmur , gallop   Abdomen- soft, non-tender   Extremities - warm, non-tender, minimal edema   Neuro- oriented, appropriate, no focal weakness   Lab Results:  Recent Labs  07/18/16 1835 07/19/16 0430  WBC 19.4* 17.0*  HGB 10.3* 9.8*  HCT 31.7* 31.5*  PLT 135* 136*   BMET:  Recent Labs  07/18/16 0400 07/18/16 1835 07/19/16 0430  NA 139  --  137  K 3.9  --  4.2  CL 110  --  103  CO2 22  --  25  GLUCOSE 130*  --  110*  BUN 12  --  10  CREATININE 0.73 1.01* 0.89  CALCIUM 6.5*  --  6.9*    PT/INR:  Recent Labs  07/19/16 0430  LABPROT 16.7*  INR 1.35   ABG    Component Value Date/Time   PHART 7.348 (L) 07/18/2016 0131   HCO3 22.2 07/18/2016 0131   TCO2 23 07/18/2016 0131   ACIDBASEDEF 3.0 (H) 07/18/2016 0131   O2SAT 98.0 07/18/2016 0131   CBG (last 3)   Recent Labs  07/19/16 0028 07/19/16 0425 07/19/16 0742  GLUCAP 108* 103*  118*    Assessment/Plan: S/P Procedure(s) (LRB): MINIMALLY INVASIVE MITRAL VALVE REPAIR (MVR) USING 26 SORIN MEMO 3D ANNULOPLASTY RING (Right) TRANSESOPHAGEAL ECHOCARDIOGRAM (TEE) (N/A) PATENT FORAMEN OVALE (PFO) CLOSURE (N/A) Mobilize Diuresis Diabetes control cont coumadin for MV repair   LOS: 2 days    Tharon Aquas Trigt III 07/19/2016

## 2016-07-20 ENCOUNTER — Inpatient Hospital Stay (HOSPITAL_COMMUNITY): Payer: Medicare Other

## 2016-07-20 LAB — GLUCOSE, CAPILLARY
GLUCOSE-CAPILLARY: 98 mg/dL (ref 65–99)
GLUCOSE-CAPILLARY: 117 mg/dL — AB (ref 65–99)
Glucose-Capillary: 94 mg/dL (ref 65–99)
Glucose-Capillary: 116 mg/dL — ABNORMAL HIGH (ref 65–99)

## 2016-07-20 LAB — BASIC METABOLIC PANEL
Anion gap: 13 (ref 5–15)
BUN: 20 mg/dL (ref 6–20)
CO2: 21 mmol/L — ABNORMAL LOW (ref 22–32)
Calcium: 7.2 mg/dL — ABNORMAL LOW (ref 8.9–10.3)
Chloride: 103 mmol/L (ref 101–111)
Creatinine, Ser: 0.92 mg/dL (ref 0.44–1.00)
GFR calc Af Amer: >60 mL/min (ref >60)
GFR calc non Af Amer: >60 mL/min (ref >60)
Glucose, Bld: 85 mg/dL (ref 65–99)
Potassium: 4.7 mmol/L (ref 3.5–5.1)
Sodium: 137 mmol/L (ref 135–145)

## 2016-07-20 LAB — PROTIME-INR
INR: 1.19
PROTHROMBIN TIME: 15.2 s (ref 11.4–15.2)

## 2016-07-20 LAB — CBC
HCT: 33.4 % — ABNORMAL LOW (ref 36.0–46.0)
Hemoglobin: 10.6 g/dL — ABNORMAL LOW (ref 12.0–15.0)
MCH: 28.6 pg (ref 26.0–34.0)
MCHC: 31.7 g/dL (ref 30.0–36.0)
MCV: 90.3 fL (ref 78.0–100.0)
Platelets: 124 10*3/uL — ABNORMAL LOW (ref 150–400)
RBC: 3.7 MIL/uL — ABNORMAL LOW (ref 3.87–5.11)
RDW: 16 % — ABNORMAL HIGH (ref 11.5–15.5)
WBC: 15.1 10*3/uL — ABNORMAL HIGH (ref 4.0–10.5)

## 2016-07-20 MED ORDER — SODIUM CHLORIDE 0.9% FLUSH: 3.0000 mL | INTRAVENOUS | Status: DC | PRN

## 2016-07-20 MED ORDER — MAGNESIUM HYDROXIDE 400 MG/5ML PO SUSP
30.0000 mL | Freq: Every day | ORAL | Status: AC
  Administered 2016-07-20 – 2016-07-21 (×2): 30 mL via ORAL
  Filled 2016-07-20 (×2): qty 30

## 2016-07-20 MED ORDER — INSULIN ASPART 100 UNIT/ML ~~LOC~~ SOLN: 0.0000 [IU] | Freq: Three times a day (TID) | SUBCUTANEOUS | Status: DC

## 2016-07-20 MED ORDER — SODIUM CHLORIDE 0.9% FLUSH
3.0000 mL | Freq: Two times a day (BID) | INTRAVENOUS | Status: DC
  Administered 2016-07-20 – 2016-07-23 (×6): 3 mL via INTRAVENOUS

## 2016-07-20 MED ORDER — SODIUM CHLORIDE 0.9 % IV SOLN: 250.0000 mL | INTRAVENOUS | Status: DC | PRN

## 2016-07-20 MED ORDER — AMIODARONE HCL IN DEXTROSE 360-4.14 MG/200ML-% IV SOLN
60.0000 mg/h | INTRAVENOUS | Status: AC
  Administered 2016-07-20 (×2): 60 mg/h via INTRAVENOUS
  Filled 2016-07-20: qty 200

## 2016-07-20 MED ORDER — MOVING RIGHT ALONG BOOK
Freq: Once | Status: AC
  Administered 2016-07-20: 15:00:00
  Filled 2016-07-20: qty 1

## 2016-07-20 MED ORDER — WARFARIN SODIUM 5 MG PO TABS
5.0000 mg | ORAL_TABLET | Freq: Every day | ORAL | Status: DC
  Administered 2016-07-20 – 2016-07-22 (×3): 5 mg via ORAL
  Filled 2016-07-20 (×3): qty 1

## 2016-07-20 MED ORDER — AMIODARONE HCL IN DEXTROSE 360-4.14 MG/200ML-% IV SOLN
30.0000 mg/h | INTRAVENOUS | Status: DC
Start: 2016-07-20 — End: 2016-07-21
  Administered 2016-07-21: 30 mg/h via INTRAVENOUS
  Filled 2016-07-20 (×2): qty 200

## 2016-07-20 NOTE — Progress Notes (Signed)
Pt converted to Afib during ambulation and sustained approx 30 minutes. HR 80-90's, BP 94/59, O2 96% on 1L . Pt tolerated walk well with multiple rest periods. Ambulated 260ft and is now in bed, stable. Dr Prescott Gum paged, IV amio ordered for the night and unit tx cancelled.

## 2016-07-20 NOTE — Plan of Care (Signed)
Problem: Activity: Goal: Risk for activity intolerance will decrease Outcome: Progressing Pt ambulating 3x daily, increasing distance with each walk. Tolerating well.   Problem: Urinary Elimination: Goal: Ability to achieve and maintain adequate renal perfusion and functioning will improve Outcome: Progressing Foley catheter discontinued

## 2016-07-20 NOTE — Progress Notes (Signed)
CT surgery p.m. Rounds  Patient developed atrial fibrillation prior to transfer to stepdown She is placed back on IV amiodarone with conversion to sinus rhythm The patient walked 295 feet and feels well this p.m. She plans on transfer to skilled nursing facility in [facility] of discharge

## 2016-07-20 NOTE — Progress Notes (Signed)
3 Days Post-Op Procedure(s) (LRB): MINIMALLY INVASIVE MITRAL VALVE REPAIR (MVR) USING 26 SORIN MEMO 3D ANNULOPLASTY RING (Right) TRANSESOPHAGEAL ECHOCARDIOGRAM (TEE) (N/A) PATENT FORAMEN OVALE (PFO) CLOSURE (N/A) Subjective: Stronger- walked 297ft nsr ready for tx stepdown  Objective: Vital signs in last 24 hours: Temp:  [97 F (36.1 C)-98.5 F (36.9 C)] 97.8 F (36.6 C) (04/22 0740) Pulse Rate:  [85-103] 91 (04/22 0800) Cardiac Rhythm: Normal sinus rhythm (04/22 0000) Resp:  [9-19] 11 (04/22 0800) BP: (82-107)/(42-76) 99/63 (04/22 0800) SpO2:  [90 %-100 %] 99 % (04/22 0800) Weight:  [226 lb 13.7 oz (102.9 kg)] 226 lb 13.7 oz (102.9 kg) (04/22 0500)  Hemodynamic parameters for last 24 hours:  stable  Intake/Output from previous day: 04/21 0701 - 04/22 0700 In: 582 [P.O.:220; I.V.:362] Out: 940 [Urine:840; Chest Tube:100] Intake/Output this shift: Total I/O In: 140 [I.V.:140] Out: 130 [Urine:110; Chest Tube:20]       Exam    General- alert and comfortable   Lungs- clear without rales, wheezes   Cor- regular rate and rhythm, no murmur , gallop   Abdomen- soft, non-tender   Extremities - warm, non-tender, minimal edema   Neuro- oriented, appropriate, no focal weakness   Lab Results:  Recent Labs  07/19/16 0430 07/20/16 0523  WBC 17.0* 15.1*  HGB 9.8* 10.6*  HCT 31.5* 33.4*  PLT 136* 124*   BMET:  Recent Labs  07/19/16 0430 07/20/16 0523  NA 137 137  K 4.2 4.7  CL 103 103  CO2 25 21*  GLUCOSE 110* 85  BUN 10 20  CREATININE 0.89 0.92  CALCIUM 6.9* 7.2*    PT/INR:  Recent Labs  07/20/16 0523  LABPROT 15.2  INR 1.19   ABG    Component Value Date/Time   PHART 7.348 (L) 07/18/2016 0131   HCO3 22.2 07/18/2016 0131   TCO2 23 07/18/2016 0131   ACIDBASEDEF 3.0 (H) 07/18/2016 0131   O2SAT 98.0 07/18/2016 0131   CBG (last 3)   Recent Labs  07/19/16 2315 07/20/16 0346 07/20/16 0737  GLUCAP 102* 94 98    Assessment/Plan: S/P Procedure(s)  (LRB): MINIMALLY INVASIVE MITRAL VALVE REPAIR (MVR) USING 26 SORIN MEMO 3D ANNULOPLASTY RING (Right) TRANSESOPHAGEAL ECHOCARDIOGRAM (TEE) (N/A) PATENT FORAMEN OVALE (PFO) CLOSURE (N/A) Mobilize Diuresis d/c tubes/lines Plan for transfer to step-down: see transfer orders Increase coumadin to 5 mg  LOS: 3 days    Tharon Aquas Trigt III 07/20/2016

## 2016-07-21 ENCOUNTER — Inpatient Hospital Stay (HOSPITAL_COMMUNITY): Payer: Medicare Other

## 2016-07-21 LAB — CBC
HCT: 28 % — ABNORMAL LOW (ref 36.0–46.0)
Hemoglobin: 9 g/dL — ABNORMAL LOW (ref 12.0–15.0)
MCH: 28.9 pg (ref 26.0–34.0)
MCHC: 32.1 g/dL (ref 30.0–36.0)
MCV: 90 fL (ref 78.0–100.0)
Platelets: 122 10*3/uL — ABNORMAL LOW (ref 150–400)
RBC: 3.11 MIL/uL — ABNORMAL LOW (ref 3.87–5.11)
RDW: 15.9 % — ABNORMAL HIGH (ref 11.5–15.5)
WBC: 10.9 10*3/uL — ABNORMAL HIGH (ref 4.0–10.5)

## 2016-07-21 LAB — BASIC METABOLIC PANEL
Anion gap: 8 (ref 5–15)
BUN: 19 mg/dL (ref 6–20)
CO2: 27 mmol/L (ref 22–32)
Calcium: 6.8 mg/dL — ABNORMAL LOW (ref 8.9–10.3)
Chloride: 100 mmol/L — ABNORMAL LOW (ref 101–111)
Creatinine, Ser: 0.86 mg/dL (ref 0.44–1.00)
GFR calc Af Amer: >60 mL/min (ref >60)
GFR calc non Af Amer: >60 mL/min (ref >60)
Glucose, Bld: 103 mg/dL — ABNORMAL HIGH (ref 65–99)
Potassium: 4.1 mmol/L (ref 3.5–5.1)
Sodium: 135 mmol/L (ref 135–145)

## 2016-07-21 LAB — PROTIME-INR
INR: 1.28
PROTHROMBIN TIME: 16.1 s — AB (ref 11.4–15.2)

## 2016-07-21 MED ORDER — AMIODARONE HCL 200 MG PO TABS
200.0000 mg | ORAL_TABLET | Freq: Two times a day (BID) | ORAL | Status: DC
  Administered 2016-07-21 (×2): 200 mg via ORAL
  Filled 2016-07-21 (×2): qty 1

## 2016-07-21 MED ORDER — FUROSEMIDE 40 MG PO TABS
40.0000 mg | ORAL_TABLET | Freq: Every day | ORAL | Status: DC
  Administered 2016-07-21 – 2016-07-23 (×3): 40 mg via ORAL
  Filled 2016-07-21 (×3): qty 1

## 2016-07-21 MED ORDER — AMIODARONE HCL IN DEXTROSE 360-4.14 MG/200ML-% IV SOLN: 30.0000 mg/h | INTRAVENOUS | Status: AC

## 2016-07-21 MED ORDER — METOPROLOL TARTRATE 25 MG PO TABS
25.0000 mg | ORAL_TABLET | Freq: Two times a day (BID) | ORAL | Status: DC
  Administered 2016-07-21 – 2016-07-23 (×5): 25 mg via ORAL
  Filled 2016-07-21 (×5): qty 1

## 2016-07-21 MED ORDER — ASPIRIN EC 81 MG PO TBEC
81.0000 mg | DELAYED_RELEASE_TABLET | Freq: Every day | ORAL | Status: DC
Start: 2016-07-21 — End: 2016-07-23
  Administered 2016-07-21 – 2016-07-23 (×3): 81 mg via ORAL
  Filled 2016-07-21 (×3): qty 1

## 2016-07-21 NOTE — Care Management Note (Signed)
Case Management Note Marvetta Gibbons RN, BSN Unit 2W-Case Manager [phone]  Patient Details  Name: Lauren Bartlett MRN: [id number] Date of Birth: 15-Aug-1955  Subjective/Objective:   Pt admitted s/p mini MVR                Action/Plan: PTA pt lived at home alone, has family members nearby, per PT eval recommendations for SNF- CSW has been consulted for possible SNF placement  Expected Discharge Date:                  Expected Discharge Plan:  Skilled Nursing Facility  In-House Referral:  Clinical Social Work  Discharge planning Services  CM Consult  Post Acute Care Choice:    Choice offered to:     DME Arranged:    DME Agency:     HH Arranged:    [place] Agency:     Status of Service:  In process, will continue to follow  If discussed at Long Length of Stay Meetings, dates discussed:    Discharge Disposition:   Additional Comments:  Dawayne Patricia, RN 07/21/2016, 11:36 AM

## 2016-07-21 NOTE — Progress Notes (Addendum)
TCTS DAILY ICU PROGRESS NOTE                   [address]            [place] [address]          [phone]   4 Days Post-Op Procedure(s) (LRB): MINIMALLY INVASIVE MITRAL VALVE REPAIR (MVR) USING 26 SORIN MEMO 3D ANNULOPLASTY RING (Right) TRANSESOPHAGEAL ECHOCARDIOGRAM (TEE) (N/A) PATENT FORAMEN OVALE (PFO) CLOSURE (N/A)  Total Length of Stay:  LOS: 4 days   Subjective:  Feeling a little better.  Ambulated this morning, had to stop twice to rest.  + BM  Objective: Vital signs in last 24 hours: Temp:  [98.1 F (36.7 C)-98.4 F (36.9 C)] 98.2 F (36.8 C) (04/23 0300) Pulse Rate:  [75-103] 91 (04/23 0800) Cardiac Rhythm: Normal sinus rhythm;Heart block (04/23 0727) Resp:  [9-31] 14 (04/23 0800) BP: (77-116)/(44-95) 98/66 (04/23 0800) SpO2:  [91 %-100 %] 95 % (04/23 0800) FiO2 (%):  [24 %] 24 % (04/22 0929) Weight:  [226 lb 14.4 oz (102.9 kg)] 226 lb 14.4 oz (102.9 kg) (04/23 0300)  Filed Weights   07/19/16 0500 07/20/16 0500 07/21/16 0300  Weight: 222 lb 10.6 oz (101 kg) 226 lb 13.7 oz (102.9 kg) 226 lb 14.4 oz (102.9 kg)    Weight change: 0.8 oz (0.021 kg)   Intake/Output from previous day: 04/22 0701 - 04/23 0700 In: 1023.7 [P.O.:460; I.V.:563.7] Out: 795 [Urine:775; Chest Tube:20]  Intake/Output this shift: Total I/O In: 33.4 [I.V.:33.4] Out: -   Current Meds: Scheduled Meds: . acetaminophen  1,000 mg Oral Q6H  . amiodarone  200 mg Oral BID  . aspirin EC  325 mg Oral Daily  . bisacodyl  10 mg Oral Daily   Or  . bisacodyl  10 mg Rectal Daily  . budesonide  0.25 mg Nebulization BID  . docusate sodium  200 mg Oral Daily  . enoxaparin (LOVENOX) injection  30 mg Subcutaneous Q24H  . furosemide  40 mg Oral Daily  . levothyroxine  112 mcg Oral QAC breakfast  . magnesium hydroxide  30 mL Oral QHS  . metoprolol tartrate  12.5 mg Oral BID  . montelukast  10 mg Oral QHS  . nystatin-triamcinolone   Topical BID  . pantoprazole  40 mg Oral  Daily  . sodium chloride flush  3 mL Intravenous Q12H  . sodium chloride flush  3 mL Intravenous Q12H  . warfarin  5 mg Oral q1800  . Warfarin - Physician Dosing Inpatient   Does not apply q1800   Continuous Infusions: . sodium chloride    . sodium chloride    . amiodarone 30 mg/hr (07/21/16 0800)   PRN Meds:.sodium chloride, ipratropium-albuterol, metoCLOPramide (REGLAN) injection, metoprolol, ondansetron (ZOFRAN) IV, oxyCODONE, sodium chloride flush, sodium chloride flush, traMADol  General appearance: alert, cooperative and no distress Heart: regular rate and rhythm Lungs: diminished breath sounds bibasilar Abdomen: soft, non-tender; bowel sounds normal; no masses,  no organomegaly Extremities: edema trace Wound: clean and dry  Lab Results: CBC: Recent Labs  07/20/16 0523 07/21/16 0435  WBC 15.1* 10.9*  HGB 10.6* 9.0*  HCT 33.4* 28.0*  PLT 124* 122*   BMET:  Recent Labs  07/20/16 0523 07/21/16 0435  NA 137 135  K 4.7 4.1  CL 103 100*  CO2 21* 27  GLUCOSE 85 103*  BUN 20 19  CREATININE 0.92 0.86  CALCIUM 7.2* 6.8*    CMET: Lab Results  Component Value  Date   WBC 10.9 (H) 07/21/2016   HGB 9.0 (L) 07/21/2016   HCT 28.0 (L) 07/21/2016   PLT 122 (L) 07/21/2016   GLUCOSE 103 (H) 07/21/2016   CHOL 173 03/14/2015   TRIG 85.0 03/14/2015   HDL 57.10 03/14/2015   LDLCALC 99 03/14/2015   ALT 14 07/14/2016   AST 17 07/14/2016   NA 135 07/21/2016   K 4.1 07/21/2016   CL 100 (L) 07/21/2016   CREATININE 0.86 07/21/2016   BUN 19 07/21/2016   CO2 27 07/21/2016   TSH 1.02 03/14/2015   INR 1.28 07/21/2016   HGBA1C 5.1 07/14/2016      PT/INR:  Recent Labs  07/21/16 0435  LABPROT 16.1*  INR 1.28   Radiology: Dg Chest 2 View  Result Date: 07/21/2016 CLINICAL DATA:  Status post mitral valve repair EXAM: CHEST  2 VIEW COMPARISON:  July 20, 2016 FINDINGS: No pneumothorax. Nodular density in the right upper lobe not seen on the patient's July 08, 2016 CT  scan or July 09, 2016 chest x-ray is likely associated with an adjacent EKG lead. Focal atelectasis is possible. No other nodules or masses. Small bilateral effusions and atelectasis, greater on the right than the left. No focal infiltrate. No overt edema. The cardiomediastinal silhouette is stable. IMPRESSION: 1. Nodular density projected over the right upper lobe was not present prior to surgery on July 09, 2016 and is likely either associated with the EKG lead or represents focal atelectasis or infiltrate. Recommend a PA and lateral chest x-ray without the EKG leads before discharge. 2. Persistent small pleural effusions with underlying atelectasis, right greater than left. Electronically Signed   By: Dorise Bullion III M.D   On: 07/21/2016 07:59     Assessment/Plan: S/P Procedure(s) (LRB): MINIMALLY INVASIVE MITRAL VALVE REPAIR (MVR) USING 26 SORIN MEMO 3D ANNULOPLASTY RING (Right) TRANSESOPHAGEAL ECHOCARDIOGRAM (TEE) (N/A) PATENT FORAMEN OVALE (PFO) CLOSURE (N/A)  1. CV- Rapid A. Fib yesterday, currently NSR- will d/c IV Amiodarone, start oral tablets at 200 mg BID, continue Lopressor... D/c EPW 2. INR 1.28, continue coumadin at 5mg  daily 3. Pulm- no acute issues, weaning oxygen as tolerated, however patient uses oxygen at home at night... CXR with bilateral effusions, questionable focal area in RUL, will get 2V in AM  4. Renal- creatinine WNL, weight is stable, minimal edema.. Start oral Lasix at 40 mg daily, supplement K 5. Deconditioning- continue PT/OT, for SNF at discharge 6. Dispo- patient stable, NSR this  Morning transition to oral Amiodarone, continue coumadin for MV Repair, diuresis, transfer to step down unit     BARRETT, ERIN 07/21/2016 8:18 AM   I have seen and examined the patient and agree with the assessment and plan as outlined.  Making good progress and maintaining NSR this morning.  Transfer step down  Rexene Alberts, MD 07/21/2016 8:46 AM

## 2016-07-21 NOTE — NC FL2 (Signed)
[place] LEVEL OF CARE SCREENING TOOL     IDENTIFICATION  Patient Name: Lauren Bartlett Birthdate: [birth year] Sex: female Admission Date (Current Location): 07/17/2016  [facility] and [place] Number:  Engineering geologist and Address:  [facility] [place], [place] [address] [place], [place] [address]      Provider Number: [id number]  Attending Physician Name and Address:  Rexene Alberts, MD  Relative Name and Phone Number:       Current Level of Care: Hospital Recommended Level of Care: [place] Prior Approval Number:    Date Approved/Denied:   PASRR Number:   [id number]   Discharge Plan: SNF    Current Diagnoses: Patient Active Problem List   Diagnosis Date Noted  . S/P minimally invasive mitral valve repair 07/17/2016  . S/P patent foramen ovale closure 07/17/2016  . Chronic diastolic congestive heart failure ([place])   . Hypoxia   . Influenza B 05/20/2016  . COPD exacerbation ([place]) 05/19/2016  . COPD (chronic obstructive pulmonary disease) ([place]) 05/19/2016  . 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  . Constipation 08/05/2015  . Hospital discharge follow-up 08/05/2015  . Chronic respiratory failure with hypoxia ([place])   . Encounter for preventive health examination 03/14/2015  . Breast cancer screening 03/14/2015  . Need for hepatitis C screening test 03/14/2015  . Vitamin D deficiency 01/11/2015  . S/P hysterectomy with oophorectomy 09/22/2013  . Pulmonary hypertension ([place])   . Hypothyroidism 05/04/2012  . Severe mitral regurgitation   . Obesity (BMI 30-39.9) 08/04/2011  . Depression   . Hypertension   . Congenital absence of one kidney   . History of sciatica     Orientation RESPIRATION BLADDER Height & Weight     Self, Time, Situation, Place  Normal Continent Weight: 226 lb 14.4 oz (102.9 kg) Height:  5\' 2"  (157.5 cm)  BEHAVIORAL SYMPTOMS/MOOD NEUROLOGICAL BOWEL NUTRITION STATUS      Continent Diet (cardiac, carb modified)   AMBULATORY STATUS COMMUNICATION OF NEEDS Skin   Limited Assist Verbally Surgical wounds (chest wound- liquid skin adhesive)                       Personal Care Assistance Level of Assistance  Bathing, Dressing Bathing Assistance: Limited assistance   Dressing Assistance: Limited assistance     Functional Limitations Info             SPECIAL CARE FACTORS FREQUENCY  PT (By licensed PT), OT (By licensed OT)     PT Frequency: 5/wk OT Frequency: 5/wk            Contractures      Additional Factors Info  Code Status, Allergies Code Status Info: FULL Allergies Info: Codeine, Levaquin Levofloxacin, Penicillins, Zithromax Azithromycin, Prednisone           Current Medications (07/21/2016):  This is the current hospital active medication list Current Facility-Administered Medications  Medication Dose Route Frequency Provider Last Rate Last Dose  . 0.9 %  sodium chloride infusion  250 mL Intravenous Continuous Rexene Alberts, MD      . 0.9 %  sodium chloride infusion  250 mL Intravenous PRN Ivin Poot, MD      . acetaminophen (TYLENOL) tablet 1,000 mg  1,000 mg Oral Q6H Rexene Alberts, MD   1,000 mg at 07/21/16 0559  . amiodarone (NEXTERONE PREMIX) 360-4.14 MG/200ML-% (1.8 mg/mL) IV infusion  30 mg/hr Intravenous Continuous Rexene Alberts, MD   Stopped at 07/21/16  7096  . amiodarone (PACERONE) tablet 200 mg  200 mg Oral BID Erin R Barrett, PA-C   200 mg at 07/21/16 0921  . aspirin EC tablet 81 mg  81 mg Oral Daily Rexene Alberts, MD   81 mg at 07/21/16 2836  . bisacodyl (DULCOLAX) EC tablet 10 mg  10 mg Oral Daily Rexene Alberts, MD   10 mg at 07/20/16 0957   Or  . bisacodyl (DULCOLAX) suppository 10 mg  10 mg Rectal Daily Rexene Alberts, MD      . budesonide (PULMICORT) nebulizer solution 0.25 mg  0.25 mg Nebulization BID Rexene Alberts, MD   0.25 mg at 07/21/16 0732  . docusate sodium (COLACE) capsule 200 mg  200 mg Oral Daily Rexene Alberts, MD   200 mg  at 07/20/16 0956  . enoxaparin (LOVENOX) injection 30 mg  30 mg Subcutaneous Q24H Rexene Alberts, MD   30 mg at 07/21/16 0721  . furosemide (LASIX) tablet 40 mg  40 mg Oral Daily Erin R Barrett, PA-C   40 mg at 07/21/16 0921  . ipratropium-albuterol (DUONEB) 0.5-2.5 (3) MG/3ML nebulizer solution 3 mL  3 mL Nebulization Q6H PRN Rexene Alberts, MD   3 mL at 07/19/16 2001  . levothyroxine (SYNTHROID, LEVOTHROID) tablet 112 mcg  112 mcg Oral QAC breakfast Rexene Alberts, MD   112 mcg at 07/21/16 0721  . magnesium hydroxide (MILK OF MAGNESIA) suspension 30 mL  30 mL Oral QHS Ivin Poot, MD   30 mL at 07/20/16 2147  . metoCLOPramide (REGLAN) injection 10 mg  10 mg Intravenous Q6H PRN Erin R Barrett, PA-C   10 mg at 07/20/16 1209  . metoprolol (LOPRESSOR) injection 2.5-5 mg  2.5-5 mg Intravenous Q2H PRN Rexene Alberts, MD      . metoprolol tartrate (LOPRESSOR) tablet 25 mg  25 mg Oral BID Rexene Alberts, MD   25 mg at 07/21/16 6294  . montelukast (SINGULAIR) tablet 10 mg  10 mg Oral QHS Rexene Alberts, MD   10 mg at 07/20/16 2147  . nystatin-triamcinolone (MYCOLOG II) cream   Topical BID Ivin Poot, MD      . ondansetron [facility]) injection 4 mg  4 mg Intravenous Q6H PRN Rexene Alberts, MD   4 mg at 07/20/16 2028  . oxyCODONE (Oxy IR/ROXICODONE) immediate release tablet 5-10 mg  5-10 mg Oral Q3H PRN Rexene Alberts, MD   5 mg at 07/20/16 0957  . pantoprazole (PROTONIX) EC tablet 40 mg  40 mg Oral Daily Rexene Alberts, MD   40 mg at 07/21/16 7654  . sodium chloride flush (NS) 0.9 % injection 3 mL  3 mL Intravenous Q12H Rexene Alberts, MD   3 mL at 07/21/16 0921  . sodium chloride flush (NS) 0.9 % injection 3 mL  3 mL Intravenous PRN Rexene Alberts, MD      . sodium chloride flush (NS) 0.9 % injection 3 mL  3 mL Intravenous Q12H Ivin Poot, MD   3 mL at 07/20/16 2150  . sodium chloride flush (NS) 0.9 % injection 3 mL  3 mL Intravenous PRN Ivin Poot, MD      . traMADol Veatrice Bourbon)  tablet 50-100 mg  50-100 mg Oral Q4H PRN Rexene Alberts, MD   50 mg at 07/20/16 2025  . warfarin (COUMADIN) tablet 5 mg  5 mg Oral q1800 Ivin Poot, MD   5 mg  at 07/20/16 1759  . Warfarin - Physician Dosing Inpatient   Does not apply [place], [facility]         Discharge Medications: Please see discharge summary for a list of discharge medications.  Relevant Imaging Results:  Relevant Lab Results:   Additional Information SS#: [id number]  Jorge Ny, LCSW

## 2016-07-21 NOTE — Plan of Care (Signed)
Transfer to 2W 23 via WC and monitor, SCD's with pt. RN to rceive in room

## 2016-07-21 NOTE — Evaluation (Signed)
Physical Therapy Evaluation Patient Details Name: Lauren Bartlett MRN: [id number] DOB: 1955-08-22 Today's Date: 07/21/2016   History of Present Illness  Pt is a 60yo female who is s/p Minimally invasive MVR on 4/19. PMH: chronic resp failure, COPD, HTN, anxiety and depression and extensive heart history.  Clinical Impression  Pt admitted with above. Pt presenting with generalized weakness, decreased endurance, decreased activity tolerance, 3/4 on DOE scale, and con't edema t/o extremities. Pt to benefit from ST-SNF to address these deficits to achieve safe mod I level of function for safe transition home alone.    Follow Up Recommendations SNF;Supervision/Assistance - 24 hour    Equipment Recommendations  None recommended by PT    Recommendations for Other Services       Precautions / Restrictions Precautions Precautions: Fall Precaution Comments: SOB Restrictions Other Position/Activity Restrictions: no      Mobility  Bed Mobility               General bed mobility comments: pt up in chair upon PT arrival  Transfers Overall transfer level: Needs assistance Equipment used: Rolling walker (2 wheeled) Transfers: Sit to/from Stand Sit to Stand: Min guard         General transfer comment: v/c's to push up from chair not pull up on walker  Ambulation/Gait Ambulation/Gait assistance: Min assist Ambulation Distance (Feet): 400 Feet Assistive device: Rolling walker (2 wheeled) Gait Pattern/deviations: Step-through pattern;Decreased stride length;Trunk flexed Gait velocity: slow Gait velocity interpretation: Below normal speed for age/gender General Gait Details: + SOB, unable to get accurate reading from either pulse ox. Pt on 2Lo2 via [place]. Pt required 5-6 standing rest breaks due to fatigue and SOB  Stairs            Wheelchair Mobility    Modified Rankin (Stroke Patients Only)       Balance Overall balance assessment: Needs assistance Sitting-balance  support: Feet supported Sitting balance-Leahy Scale: Good     Standing balance support: No upper extremity supported Standing balance-Leahy Scale: Fair Standing balance comment: requires RW for walking but able to stand statically                             Pertinent Vitals/Pain Pain Assessment: 0-10 Pain Score: 2  Pain Location: incision Pain Descriptors / Indicators: Sore Pain Intervention(s): Monitored during session    Home Living Family/patient expects to be discharged to:: Skilled nursing facility Living Arrangements: Alone               Additional Comments: lived in 1 story home with 1 step to enter,     Prior Function Level of Independence: Needs assistance   Gait / Transfers Assistance Needed: used rollator in home and can in community     Comments: drives     Hand Dominance   Dominant Hand: Right    Extremity/Trunk Assessment   Upper Extremity Assessment Upper Extremity Assessment: Generalized weakness    Lower Extremity Assessment Lower Extremity Assessment: Generalized weakness    Cervical / Trunk Assessment Cervical / Trunk Assessment: Normal  Communication   Communication: No difficulties  Cognition Arousal/Alertness: Awake/alert Behavior During Therapy: Anxious (mildly) Overall Cognitive Status: Within Functional Limits for tasks assessed                                        General Comments General  comments (skin integrity, edema, etc.): pt with mild edema throughout x4 extremities    Exercises     Assessment/Plan    PT Assessment Patient needs continued PT services  PT Problem List Decreased strength;Decreased activity tolerance;Decreased balance;Decreased mobility;Decreased knowledge of use of DME       PT Treatment Interventions DME instruction;Gait training;Stair training;Functional mobility training;Therapeutic activities;Therapeutic exercise;Balance training    PT Goals (Current goals can  be found in the Care Plan section)  Acute Rehab PT Goals Patient Stated Goal: go to rehab and get stronger PT Goal Formulation: With patient Time For Goal Achievement: 07/28/16 Potential to Achieve Goals: Good    Frequency Min 3X/week   Barriers to discharge Decreased caregiver support lives alone    Co-evaluation               End of Session Equipment Utilized During Treatment: Oxygen Activity Tolerance: Patient limited by lethargy Patient left: in chair;with call bell/phone within reach (MD and PA present) Nurse Communication: Mobility status (unable to get accurate SpO2 reading during amb) PT Visit Diagnosis: Difficulty in walking, not elsewhere classified (R26.2)    Time: 9191-6606 PT Time Calculation (min) (ACUTE ONLY): 26 min   Charges:   PT Evaluation $PT Eval Moderate Complexity: 1 Procedure PT Treatments $Gait Training: 8-22 mins   PT G Codes:       Kittie Plater, PT, DPT Pager #: [phone] Office #: [phone]  [place] 07/21/2016, 8:28 AM

## 2016-07-21 NOTE — Progress Notes (Signed)
Pt received from [place] RN. Pt oriented to room and equipment. Call bell within reach. VSS. Telemetry applied, CCMD notified x2. Pt denies needs at this time, will continue to monitor.   Fritz Pickerel, RN

## 2016-07-21 NOTE — Plan of Care (Signed)
Report called to 2W RN 

## 2016-07-21 NOTE — Clinical Social Work Note (Signed)
Clinical Social Work Assessment  Patient Details  Name: Lauren Bartlett MRN: [id number] Date of Birth: 10-13-1955  Date of referral:  07/21/16               Reason for consult:  Facility Placement                Permission sought to share information with:  Facility Art therapist granted to share information::  Yes, Verbal Permission Granted  Name::        Agency::  SNF  Relationship::     Contact Information:     Housing/Transportation Living arrangements for the past 2 months:  Apartment Source of Information:  Patient Patient Interpreter Needed:  None Criminal Activity/Legal Involvement Pertinent to Current Situation/Hospitalization:  No - Comment as needed Significant Relationships:  Parents Lives with:  Self Do you feel safe going back to the place where you live?  No Need for family participation in patient care:  No (Coment)  Care giving concerns:  Pt lives at apartment alone - does not have anyone able to assist for any length of time at DC and does not feel comfortable taking care of herself with new impairment.   Social Worker assessment / plan:  CSW met with pt to discuss PT recommendation for SNF.  Patient had already been informed of recommendation- CSW explained SNF and SNF referral process.  Employment status:  Disabled (Comment on whether or not currently receiving Disability) Insurance information:  Managed Medicare PT Recommendations:  [place] / Referral to community resources:  [place]  Patient/Family's Response to care:  Patient is agreeable to SNF stay and has already picked preferences.  Patient/Family's Understanding of and Emotional Response to Diagnosis, Current Treatment, and Prognosis:  Pt seems very realistic about her need for rehab stay and is amenable to MD recommendations.  Emotional Assessment Appearance:  Appears older than stated age Attitude/Demeanor/Rapport:    Affect  (typically observed):  Appropriate Orientation:  Oriented to Situation, Oriented to Place, Oriented to  Time, Oriented to Self Alcohol / Substance use:  Not Applicable Psych involvement (Current and /or in the community):  No (Comment)  Discharge Needs  Concerns to be addressed:  Care Coordination Readmission within the last 30 days:  No Current discharge risk:  Physical Impairment Barriers to Discharge:  Continued Medical Work up   Jorge Ny, LCSW 07/21/2016, 1:05 PM

## 2016-07-22 LAB — PROTIME-INR
INR: 1.49
Prothrombin Time: 18.2 seconds — ABNORMAL HIGH (ref 11.4–15.2)

## 2016-07-22 MED ORDER — PATIENT'S GUIDE TO USING COUMADIN BOOK
Freq: Once | Status: AC
  Administered 2016-07-22: 18:00:00
  Filled 2016-07-22: qty 1

## 2016-07-22 MED ORDER — AMIODARONE HCL 200 MG PO TABS
200.0000 mg | ORAL_TABLET | Freq: Every day | ORAL | Status: DC
  Administered 2016-07-22: 200 mg via ORAL
  Filled 2016-07-22: qty 1

## 2016-07-22 NOTE — Clinical Social Work Note (Signed)
Bed offers provided. Patient has accepted [facility]. This was her first preference SNF. CSW notified admissions coordinator Tammy that per MD note, patient will likely discharge tomorrow or Thursday.  [place] Scrape, [place]

## 2016-07-22 NOTE — Progress Notes (Addendum)
      [address]       [place] [address]             [phone]      5 Days Post-Op Procedure(s) (LRB): MINIMALLY INVASIVE MITRAL VALVE REPAIR (MVR) USING 26 SORIN MEMO 3D ANNULOPLASTY RING (Right) TRANSESOPHAGEAL ECHOCARDIOGRAM (TEE) (N/A) PATENT FORAMEN OVALE (PFO) CLOSURE (N/A)   Subjective:  Patient states had nausea overnight.  Thinks this is due to Amiodarone.  She also gets "short winded" with ambulation.  Objective: Vital signs in last 24 hours: Temp:  [97.9 F (36.6 C)-98.5 F (36.9 C)] 97.9 F (36.6 C) (04/24 0529) Pulse Rate:  [78-91] 82 (04/24 0529) Cardiac Rhythm: Normal sinus rhythm;Heart block (04/23 1900) Resp:  [12-18] 18 (04/24 0529) BP: (91-120)/(54-70) 120/68 (04/24 0529) SpO2:  [94 %-100 %] 100 % (04/24 0529) Weight:  [224 lb 13.9 oz (102 kg)] 224 lb 13.9 oz (102 kg) (04/24 0529)  Intake/Output from previous day: 04/23 0701 - 04/24 0700 In: 673.4 [P.O.:640; I.V.:33.4] Out: 200 [Urine:200]  General appearance: alert, cooperative and no distress Heart: regular rate and rhythm Lungs: diminished breath sounds bibasilar Abdomen: soft, non-tender; bowel sounds normal; no masses,  no organomegaly Extremities: edema trace Wound: clean and dry  Lab Results:  Recent Labs  07/20/16 0523 07/21/16 0435  WBC 15.1* 10.9*  HGB 10.6* 9.0*  HCT 33.4* 28.0*  PLT 124* 122*   BMET:  Recent Labs  07/20/16 0523 07/21/16 0435  NA 137 135  K 4.7 4.1  CL 103 100*  CO2 21* 27  GLUCOSE 85 103*  BUN 20 19  CREATININE 0.92 0.86  CALCIUM 7.2* 6.8*    PT/INR:  Recent Labs  07/22/16 0219  LABPROT 18.2*  INR 1.49   ABG    Component Value Date/Time   PHART 7.348 (L) 07/18/2016 0131   HCO3 22.2 07/18/2016 0131   TCO2 23 07/18/2016 0131   ACIDBASEDEF 3.0 (H) 07/18/2016 0131   O2SAT 98.0 07/18/2016 0131   CBG (last 3)   Recent Labs  07/20/16 0737 07/20/16 1126 07/20/16 1637  GLUCAP 98 116* 117*    Assessment/Plan: S/P  Procedure(s) (LRB): MINIMALLY INVASIVE MITRAL VALVE REPAIR (MVR) USING 26 SORIN MEMO 3D ANNULOPLASTY RING (Right) TRANSESOPHAGEAL ECHOCARDIOGRAM (TEE) (N/A) PATENT FORAMEN OVALE (PFO) CLOSURE (N/A)  1. CV- PAF, maintaining NSR, BP controlled- continue Lopressor, will reduce Amiodarone to 200 mg daily with prolonged QtC of 534 and patients nausea 2. INR at 1.49, continue coumadin at 5 mg daily 3. Pulm- no acute issues, off oxygen, gets dyspnea with ambulation, continue IS 4. Renal- creatinine remains WNL, weight is elevated, no significant LE edema, continue Lasix 5. Deconditioning- continue PT/OT, SNF middle of the week 6. Dispo- patient stable, will get 2V CXR in AM to assess opacity in RUL per Radiology recommendations, decrease Amiodarone due to prolonged Qtc/nausea, continue Coumadin INR trending upward, continue diuretics,   Maybe ready for SNF tomorrow or Thursday   LOS: 5 days    BARRETT, ERIN 07/22/2016  I have seen and examined the patient and agree with the assessment and plan as outlined.  Rexene Alberts, MD 07/22/2016 8:37 AM

## 2016-07-22 NOTE — Consult Note (Signed)
   [facility] CM Inpatient Consult   07/22/2016  NIOMI VALENT 1955-11-03 [id number]     Patient screened for potential [facility] Care Management services. Chart reviewed. Noted current discharge plan is for SNF.  There are no identifiable [facility] Care Management needs at this time. If patient's post hospital needs change, please place a [facility] Care Management consult. For questions please contact:  Marthenia Rolling, [place], RN,BSN [facility] Liaison [phone]

## 2016-07-22 NOTE — Progress Notes (Signed)
Physical Therapy Treatment Patient Details Name: Lauren Bartlett MRN: [id number] DOB: 30-Jan-1956 Today's Date: 07/22/2016    History of Present Illness Pt is a 61yo female who is s/p Minimally invasive MVR on 4/19. PMH: chronic resp failure, COPD, HTN, anxiety and depression and extensive heart history.    PT Comments    Pt making progress but continues to fatigue quickly requiring frequent rest breaks.   Follow Up Recommendations  SNF;Supervision/Assistance - 24 hour     Equipment Recommendations  None recommended by PT    Recommendations for Other Services       Precautions / Restrictions Precautions Precautions: Fall Precaution Comments: SOB Restrictions Other Position/Activity Restrictions: no    Mobility  Bed Mobility               General bed mobility comments: pt up in chair upon PT arrival  Transfers Overall transfer level: Needs assistance Equipment used: Rolling walker (2 wheeled) Transfers: Sit to/from Stand Sit to Stand: Supervision         General transfer comment: rose from chair without cues  Ambulation/Gait Ambulation/Gait assistance: Min guard Ambulation Distance (Feet): 170 Feet Assistive device: Rolling walker (2 wheeled) Gait Pattern/deviations: Step-through pattern;Decreased stride length;Trunk flexed Gait velocity: slow Gait velocity interpretation: Below normal speed for age/gender General Gait Details: Pt amb on RA with SpO2 >98% but +SOB and pt took 3 standing rest breaks with forearms propped on walker   Stairs            Wheelchair Mobility    Modified Rankin (Stroke Patients Only)       Balance Overall balance assessment: Needs assistance Sitting-balance support: Feet supported Sitting balance-Leahy Scale: Good     Standing balance support: No upper extremity supported Standing balance-Leahy Scale: Fair                              Cognition Arousal/Alertness: Awake/alert Behavior During  Therapy: WFL for tasks assessed/performed Overall Cognitive Status: Within Functional Limits for tasks assessed                                        Exercises      General Comments        Pertinent Vitals/Pain Pain Assessment: No/denies pain    Home Living                      Prior Function            PT Goals (current goals can now be found in the care plan section) Progress towards PT goals: Progressing toward goals    Frequency    Min 2X/week      PT Plan Current plan remains appropriate;Frequency needs to be updated    Co-evaluation             End of Session Equipment Utilized During Treatment: Oxygen Activity Tolerance: Patient limited by fatigue Patient left: in chair;with call bell/phone within reach   PT Visit Diagnosis: Difficulty in walking, not elsewhere classified (R26.2)     Time: 0240-9735 PT Time Calculation (min) (ACUTE ONLY): 20 min  Charges:  $Gait Training: 8-22 mins                    G CodesJeani Hawking Patria Warzecha PT [phone]  Shary Decamp [facility] 07/22/2016, 4:04 PM

## 2016-07-22 NOTE — Care Management Important Message (Signed)
Important Message  Patient Details  Name: Lauren Bartlett MRN: [id number] Date of Birth: 02/07/1956   Medicare Important Message Given:  Yes    Orbie Pyo 07/22/2016, 3:01 PM

## 2016-07-22 NOTE — Progress Notes (Signed)
CARDIAC REHAB PHASE I   PRE:  Rate/Rhythm: 94 SR    BP: sitting 123/73    SaO2: 96 1L, 96 RA  MODE:  Ambulation: 350 ft   POST:  Rate/Rhythm: 109 ST    BP: sitting 124/59     SaO2: 97 RA  Pt moving mostly independently. Able to stand and use rollator (she uses rollator at home). Steady, slow pace. She had to stop and rest standing x2 then sitting x3. She was able to do this with the appropriate safe mechanics.  She c/o'd SOB, nausea, and fatigue. Return to recliner. Not requiring O2 while up, she apparently did need O2 lying flat this am. Encouraged more walking and IS.  [place], ACSM 07/22/2016 11:55 AM

## 2016-07-22 NOTE — Progress Notes (Signed)
Ambulated 150 feet on O2 at 2LPM using front wheel walker tolerated well.

## 2016-07-22 NOTE — Discharge Summary (Signed)
Physician Discharge Summary  Patient ID: Lauren Bartlett MRN: [id number] DOB/AGE: 05-Sep-1955 61 y.o.  Admit date: 07/17/2016 Discharge date: 07/23/2016  Admission Diagnoses:  Patient Active Problem List   Diagnosis Date Noted  . Chronic diastolic congestive heart failure ([place])   . Hypoxia   . Influenza B 05/20/2016  . COPD exacerbation ([place]) 05/19/2016  . COPD (chronic obstructive pulmonary disease) ([place]) 05/19/2016  . 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  . Constipation 08/05/2015  . Hospital discharge follow-up 08/05/2015  . Chronic respiratory failure with hypoxia ([place])   . Encounter for preventive health examination 03/14/2015  . Breast cancer screening 03/14/2015  . Need for hepatitis C screening test 03/14/2015  . Vitamin D deficiency 01/11/2015  . S/P hysterectomy with oophorectomy 09/22/2013  . Pulmonary hypertension ([place])   . Hypothyroidism 05/04/2012  . Severe mitral regurgitation   . Obesity (BMI 30-39.9) 08/04/2011  . Depression   . Hypertension   . Congenital absence of one kidney   . History of sciatica    Discharge Diagnoses:   Patient Active Problem List   Diagnosis Date Noted  . S/P minimally invasive mitral valve repair 07/17/2016  . S/P patent foramen ovale closure 07/17/2016  . Chronic diastolic congestive heart failure ([place])   . Hypoxia   . Influenza B 05/20/2016  . COPD exacerbation ([place]) 05/19/2016  . COPD (chronic obstructive pulmonary disease) ([place]) 05/19/2016  . 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  . Constipation 08/05/2015  . Hospital discharge follow-up 08/05/2015  . Chronic respiratory failure with hypoxia ([place])   . Encounter for preventive health examination 03/14/2015  . Breast cancer screening 03/14/2015  . Need for hepatitis C screening test 03/14/2015  . Vitamin D deficiency 01/11/2015  . S/P hysterectomy with oophorectomy 09/22/2013  . Pulmonary hypertension ([place])   . Hypothyroidism 05/04/2012  . Severe mitral regurgitation   . Obesity (BMI 30-39.9)  08/04/2011  . Depression   . Hypertension   . Congenital absence of one kidney   . History of sciatica    Discharged Condition: good  History of Present Illness:  Lauren Bartlett is a 61 yo obese white female with known history of Mitral Regurgitation due to Mitral Valve Prolapse, COPD, essential Hypertension, anxiety, and depression.  The patient first noticed she began experiencing problems with severe exertional shortness of breath 2-3 years ago.  Her symptoms progressed and she has been evaluated on numerous occasions and hospitalized on numerous occasions with acute exacerbations which was felt to be contributed to COPD.  The patient is a life long non-smoker.  She was evaluated by Lauren Bartlett in December and found to have moderate COPD and some tracheomalacia. Lauren Bartlett felt that the patient's shortness of breath with disproportionate to her pulmonary findings. An echocardiogram was performed demonstrating mitral valve prolapse with severe mitral regurgitation. Left ventricular systolic function was normal. She was referred to Lauren Bartlett who first evaluated the patient on [name]. The patient subsequently underwent TEE and diagnostic cardiac catheterization on 06/16/2016. TEE confirmed the presence of mitral valve prolapse with a large flail segment involving the middle scallop of the posterior leaflet and severe mitral regurgitation. Left ventricular function appeared normal with ejection fraction estimated 60-65%. Left and right heart catheterization confirmed the presence of severe mitral regurgitation. There was no significant coronary artery disease. Left ventricular function appeared normal. There was moderate pulmonary hypertension. The patient wasreferred for surgical consultation.  She was evaluated by Lauren Bartlett who was in agreement she would benefit from intervention on her Mitral Valve.  He  recommended a Minimally invasive approach with likely repair.  She was also noted to have a PFO  which would also be repaired at the time.  The risks and benefits of the procedure were explained to the patient and she was agreeable to proceed.  Hospital Course:   Lauren Bartlett [facility].  She was taken to the operating room and underwent Minimally Invasive Mitral Valve Repair with complex vavuloplasty with triangular resection of P2 segment, placement of 4 Gore-tex Neochord, and ring annuloplasty with a 26 mm Sorin 3D Ring.  She also underwent closure of a patent foramen ovale.  She tolerated the procedure without difficulty and was taken to the SICU in stable condition.  During her stay in the SICU the patient was weaned and extubated.  She was started on low dose coumadin for her Mitral valve repair on POD #1.  She was started on gentle diuresis for hypervolemia.  Her chest tubes and arterial lines were removed on POD # 3.  She developed rapid Atrial Fibrillation on POD #3 as well.  She was treated with IV Amiodarone with successful conversion to NSR.  She was ambulating with assistance and felt medically stable for transfer to the stepdown unit on POD #4.  The patient continues to make progress.  Her pacing wires have been removed without difficulty.  She remains mildly hypervolemic and will remain on diuresis with Lasix.  She continues to maintain NSR.  However she developed nausea and mild QtC prolongation.  Therefore, her amiodarone was decreased to 200 mg daily.  She remains on Coumadin at 5 mg daily.  Her INR is 1.54.  Her goal INR range is 2.5-3.0.  Her INR results should be faxed to [facility] at [phone].  PT/INR follow up will need to be arranged with them on discharge from SNF facility.  She is deconditioned.  PT/OT consults have been completed and they recommended SNF placement.  The patient remains hemodynamically stable.  Her pain is well controlled.  She is tolerating a heart healthy diet.  She is medically stable for discharge  today.      Significant Diagnostic Studies: cardiac graphics:   Echocardiogram:- Left ventricle: Systolic function was normal. The estimated   ejection fraction was in the range of 60% to 65%. Wall motion was   normal; there were no regional wall motion abnormalities. - Mitral valve: Severe prolapse, involving the posterior leaflet.   No evidence of vegetation. There was severe regurgitation   directed anteriorly. - Atrial septum: No defect or patent foramen ovale was identified.   Echo contrast study showed no right-to-left atrial level shunt,   following an increase in RA pressure induced by provocative   maneuvers. - Tricuspid valve: There was mild regurgitation. - Pulmonary arteries: Systolic pressure was moderately increased.   PA peak pressure: 50 mm Hg (S).  Treatments: surgery:    Minimally-Invasive Mitral Valve Repair     Complex valvuloplasty including triangular resection of flail segment (P2) of posterior leaflet                     Gore-tex neochord placement x4        Sorin Memo 3D Ring Annuloplasty (size 31mm, catalog # [id number], serial # [id number])        Closure of patent foramen ovale  Disposition: 01-Home or Self Care   Discharge Medications:  The patient has been discharged on:   1.Beta Blocker:  Yes [ x  ]                              No   [   ]                              If No, reason:  2.Ace Inhibitor/ARB: Yes [   ]                                     No  [ x   ]                                     If No, reason: labile BP  3.Statin:   Yes [   ]                  No  [ x  ]                  If No, reason: No CAD  4.S[place]a [place]:  Yes  [ x  ]                  No   [   ]                  If No, reason:      Allergies as of 07/23/2016      Reactions   Codeine Hives, Nausea And Vomiting   Levaquin [levofloxacin] Nausea And Vomiting   Penicillins Hives, Itching, Other (See Comments)   Has patient had a PCN reaction causing immediate rash,  facial/tongue/throat swelling, SOB or lightheadedness with hypotension: No Has patient had a PCN reaction causing severe rash involving mucus membranes or skin necrosis: No Has patient had a PCN reaction that required hospitalization No Has patient had a PCN reaction occurring within the last 10 years: No If all of the above answers are "NO", then may proceed with Cephalosporin use.   Zithromax [azithromycin] Other (See Comments)   Reaction:  Hallucinations    Prednisone Palpitations, Other (See Comments)   Reaction:  Hallucinations  Can take injections but not oral meds      Medication List    STOP taking these medications   amiodarone 200 MG tablet Commonly known as:  PACERONE   doxycycline 100 MG tablet Commonly known as:  VIBRA-TABS   hydrochlorothiazide 25 MG tablet Commonly known as:  HYDRODIURIL   losartan 100 MG tablet Commonly known as:  COZAAR     TAKE these medications   acetaminophen 500 MG tablet Commonly known as:  TYLENOL Take 500 mg by mouth 2 (two) times daily as needed for moderate pain or headache.   albuterol 108 (90 Base) MCG/ACT inhaler Commonly known as:  PROVENTIL HFA;VENTOLIN HFA Inhale 2 puffs into the lungs every 6 (six) hours as needed for wheezing or shortness of breath. Pt uses with SPACER.   albuterol (2.5 MG/3ML) 0.083% nebulizer solution Commonly known as:  PROVENTIL Take 2.5 mg by nebulization every 6 (six) hours as needed for wheezing or shortness of breath.   aspirin 81 MG EC tablet Take 1 tablet (81 mg total) by mouth daily.   benzonatate 200 MG capsule Commonly known as:  TESSALON Take 1 capsule (200 mg total) by mouth 3 (three) times daily as needed for cough.   famotidine 10 MG chewable tablet Commonly known as:  PEPCID AC Chew 10 mg by mouth daily as needed for heartburn.   fluticasone 110 MCG/ACT inhaler Commonly known as:  FLOVENT HFA Inhale 1 puff into the lungs 2 (two) times daily.   fluticasone 50 MCG/ACT nasal  spray Commonly known as:  FLONASE Place 2 sprays into both nostrils daily.   furosemide 40 MG tablet Commonly known as:  LASIX Take 1 tablet (40 mg total) by mouth daily.   hydroxypropyl methylcellulose / hypromellose 2.5 % ophthalmic solution Commonly known as:  ISOPTO TEARS / GONIOVISC Place 1 drop into both eyes 2 (two) times daily.   ipratropium 0.03 % nasal spray Commonly known as:  ATROVENT USE 2 SPRAYS NASALLY THREE TIMES DAILY   ISOPTO TEARS 0.5 % Soln Generic drug:  Hypromellose Apply 1 drop to eye 2 (two) times daily.   levothyroxine 112 MCG tablet Commonly known as:  SYNTHROID, LEVOTHROID Take 112 mcg by mouth daily before breakfast.   loratadine-pseudoephedrine 10-240 MG 24 hr tablet Commonly known as:  CLARITIN-D 24-hour Take 1 tablet by mouth daily.   LORazepam 0.5 MG tablet Commonly known as:  ATIVAN Take 1 tablet (0.5 mg total) by mouth every 8 (eight) hours as needed for anxiety.   metoprolol tartrate 25 MG tablet Commonly known as:  LOPRESSOR Take 1 tablet (25 mg total) by mouth 2 (two) times daily.   montelukast 10 MG tablet Commonly known as:  SINGULAIR Take 1 tablet (10 mg total) by mouth at bedtime.   OXYGEN Inhale 2 L into the lungs at bedtime as needed (at bedtime and as needed).   polyethylene glycol powder powder Commonly known as:  GLYCOLAX/MIRALAX Take 17 g by mouth 2 (two) times daily as needed. What changed:  when to take this  reasons to take this   traMADol 50 MG tablet Commonly known as:  ULTRAM Take 1-2 tablets (50-100 mg total) by mouth every 4 (four) hours as needed for moderate pain. What changed:  how much to take  how to take this  when to take this  reasons to take this  additional instructions   umeclidinium-vilanterol 62.5-25 MCG/INH Aepb Commonly known as:  ANORO ELLIPTA Inhale 1 puff into the lungs daily.   warfarin 5 MG tablet Commonly known as:  COUMADIN Take 1 tablet (5 mg total) by mouth daily at  6 PM.       Contact information for follow-up providers    [facility] [employer]. Schedule an appointment as soon as possible for a visit.   Specialty:  Cardiology Why:  Please call the day you are discharged from SNF to set up PT/INR appointment. Contact information: [address], [place] [phone]       [facility] [place] Follow up on 08/04/2016.   Specialty:  Cardiothoracic Surgery Why:  Appointment is at 1:30, please get CXR at 1:00 at [facility] located on first floor of our office building. Contact information: [place], [place]       Murray Hodgkins, NP Follow up on 08/05/2016.   Specialties:  Nurse Practitioner, Cardiology, Radiology Why:  Appointmetn is at 2:00 Contact information: [place] [address] [place]  information for after-discharge care    Destination    [facility] Follow up.   Specialty:  [place] information: [address] [place] [phone]                  Signed: Ellwood Handler 07/23/2016, 8:04 AM

## 2016-07-23 DIAGNOSIS — I482 Chronic atrial fibrillation: Secondary | ICD-10-CM | POA: Diagnosis not present

## 2016-07-23 DIAGNOSIS — K5909 Other constipation: Secondary | ICD-10-CM | POA: Diagnosis not present

## 2016-07-23 DIAGNOSIS — I509 Heart failure, unspecified: Secondary | ICD-10-CM | POA: Diagnosis not present

## 2016-07-23 DIAGNOSIS — Z9889 Other specified postprocedural states: Secondary | ICD-10-CM | POA: Diagnosis not present

## 2016-07-23 DIAGNOSIS — Z7901 Long term (current) use of anticoagulants: Secondary | ICD-10-CM | POA: Diagnosis not present

## 2016-07-23 DIAGNOSIS — Z789 Other specified health status: Secondary | ICD-10-CM | POA: Diagnosis not present

## 2016-07-23 DIAGNOSIS — R05 Cough: Secondary | ICD-10-CM | POA: Diagnosis not present

## 2016-07-23 DIAGNOSIS — I5020 Unspecified systolic (congestive) heart failure: Secondary | ICD-10-CM | POA: Diagnosis not present

## 2016-07-23 DIAGNOSIS — Z79899 Other long term (current) drug therapy: Secondary | ICD-10-CM | POA: Diagnosis not present

## 2016-07-23 DIAGNOSIS — I340 Nonrheumatic mitral (valve) insufficiency: Secondary | ICD-10-CM | POA: Diagnosis not present

## 2016-07-23 DIAGNOSIS — I4891 Unspecified atrial fibrillation: Secondary | ICD-10-CM | POA: Diagnosis not present

## 2016-07-23 DIAGNOSIS — R498 Other voice and resonance disorders: Secondary | ICD-10-CM | POA: Diagnosis not present

## 2016-07-23 DIAGNOSIS — G8918 Other acute postprocedural pain: Secondary | ICD-10-CM | POA: Diagnosis not present

## 2016-07-23 DIAGNOSIS — E039 Hypothyroidism, unspecified: Secondary | ICD-10-CM | POA: Diagnosis not present

## 2016-07-23 DIAGNOSIS — R531 Weakness: Secondary | ICD-10-CM | POA: Diagnosis not present

## 2016-07-23 DIAGNOSIS — M6281 Muscle weakness (generalized): Secondary | ICD-10-CM | POA: Diagnosis not present

## 2016-07-23 DIAGNOSIS — D649 Anemia, unspecified: Secondary | ICD-10-CM | POA: Diagnosis not present

## 2016-07-23 DIAGNOSIS — E038 Other specified hypothyroidism: Secondary | ICD-10-CM | POA: Diagnosis not present

## 2016-07-23 DIAGNOSIS — I10 Essential (primary) hypertension: Secondary | ICD-10-CM | POA: Diagnosis not present

## 2016-07-23 DIAGNOSIS — J302 Other seasonal allergic rhinitis: Secondary | ICD-10-CM | POA: Diagnosis not present

## 2016-07-23 DIAGNOSIS — N61 Mastitis without abscess: Secondary | ICD-10-CM | POA: Diagnosis not present

## 2016-07-23 DIAGNOSIS — Z952 Presence of prosthetic heart valve: Secondary | ICD-10-CM | POA: Diagnosis not present

## 2016-07-23 DIAGNOSIS — J441 Chronic obstructive pulmonary disease with (acute) exacerbation: Secondary | ICD-10-CM | POA: Diagnosis not present

## 2016-07-23 DIAGNOSIS — I5042 Chronic combined systolic (congestive) and diastolic (congestive) heart failure: Secondary | ICD-10-CM | POA: Diagnosis not present

## 2016-07-23 DIAGNOSIS — J9811 Atelectasis: Secondary | ICD-10-CM | POA: Diagnosis not present

## 2016-07-23 LAB — PROTIME-INR
INR: 1.54
PROTHROMBIN TIME: 18.7 s — AB (ref 11.4–15.2)

## 2016-07-23 MED ORDER — FUROSEMIDE 40 MG PO TABS: 40.0000 mg | ORAL_TABLET | Freq: Every day | ORAL | 0 refills | Status: DC

## 2016-07-23 MED ORDER — WARFARIN SODIUM 5 MG PO TABS: 5.0000 mg | ORAL_TABLET | Freq: Every day | ORAL | Status: DC

## 2016-07-23 MED ORDER — TRAMADOL HCL 50 MG PO TABS: 50.0000 mg | ORAL_TABLET | ORAL | 0 refills | Status: DC | PRN

## 2016-07-23 MED ORDER — ASPIRIN 81 MG PO TBEC: 81.0000 mg | DELAYED_RELEASE_TABLET | Freq: Every day | ORAL | Status: AC

## 2016-07-23 MED ORDER — METOPROLOL TARTRATE 25 MG PO TABS: 25.0000 mg | ORAL_TABLET | Freq: Two times a day (BID) | ORAL | Status: DC

## 2016-07-23 NOTE — Progress Notes (Addendum)
      [address]       [place] [address]             [phone]      6 Days Post-Op Procedure(s) (LRB): MINIMALLY INVASIVE MITRAL VALVE REPAIR (MVR) USING 26 SORIN MEMO 3D ANNULOPLASTY RING (Right) TRANSESOPHAGEAL ECHOCARDIOGRAM (TEE) (N/A) PATENT FORAMEN OVALE (PFO) CLOSURE (N/A)   Subjective:  No new complaints.  Feels like nausea is better.  + ambulation with assistance.  +BM  Objective: Vital signs in last 24 hours: Temp:  [98 F (36.7 C)-98.3 F (36.8 C)] 98 F (36.7 C) (04/25 0613) Pulse Rate:  [82-101] 82 (04/25 0613) Cardiac Rhythm: Normal sinus rhythm;Heart block (04/24 1930) Resp:  [18-20] 20 (04/25 0613) BP: (98-129)/(58-77) 129/77 (04/25 0613) SpO2:  [87 %-100 %] 98 % (04/25 0742) FiO2 (%):  [21 %] 21 % (04/25 0742) Weight:  [224 lb 6.9 oz (101.8 kg)] 224 lb 6.9 oz (101.8 kg) (04/25 0408)  Intake/Output from previous day: 04/24 0701 - 04/25 0700 In: 483 [P.O.:480; I.V.:3] Out: 350 [Urine:350]  General appearance: alert, cooperative and no distress Heart: regular rate and rhythm Lungs: diminished breath sounds bibasilar Abdomen: soft, non-tender; bowel sounds normal; no masses,  no organomegaly Extremities: edema trace Wound: clean and dry  Lab Results:  Recent Labs  07/21/16 0435  WBC 10.9*  HGB 9.0*  HCT 28.0*  PLT 122*   BMET:  Recent Labs  07/21/16 0435  NA 135  K 4.1  CL 100*  CO2 27  GLUCOSE 103*  BUN 19  CREATININE 0.86  CALCIUM 6.8*    PT/INR:  Recent Labs  07/23/16 0337  LABPROT 18.7*  INR 1.54   ABG    Component Value Date/Time   PHART 7.348 (L) 07/18/2016 0131   HCO3 22.2 07/18/2016 0131   TCO2 23 07/18/2016 0131   ACIDBASEDEF 3.0 (H) 07/18/2016 0131   O2SAT 98.0 07/18/2016 0131   CBG (last 3)   Recent Labs  07/20/16 1126 07/20/16 1637  GLUCAP 116* 117*    Assessment/Plan: S/P Procedure(s) (LRB): MINIMALLY INVASIVE MITRAL VALVE REPAIR (MVR) USING 26 SORIN MEMO 3D ANNULOPLASTY RING  (Right) TRANSESOPHAGEAL ECHOCARDIOGRAM (TEE) (N/A) PATENT FORAMEN OVALE (PFO) CLOSURE (N/A)  1. CV- maintaining NSR, QTC increased to 590, will d/c Amiodarone, continue Lopressor at 25 mg BID 2. Pulm- oxygen prn, + COPD- continue IS 3. INR at 1.54, continue coumadin at 5 mg daily 4. Renal- creatinine has been stable, weight trending down, continue Lasix, potassium 5. Deconditioning- PT/OT recs SNF, bed is available 6. Dispo- patient stable, will d/c to SNF today   LOS: 6 days    BARRETT, ERIN 07/23/2016  I have seen and examined the patient and agree with the assessment and plan as outlined.  D/C to SNF today  Rexene Alberts, MD 07/23/2016 8:36 AM

## 2016-07-23 NOTE — Progress Notes (Signed)
07/23/2016 11:49 AM Chest Tube sutures removed.  Pt tolerated well.   Carney Corners

## 2016-07-23 NOTE — NC FL2 (Signed)
[place] LEVEL OF CARE SCREENING TOOL     IDENTIFICATION  Patient Name: Lauren Bartlett Birthdate: Jun 24, 1955 Sex: female Admission Date (Current Location): 07/17/2016  [facility] and [place] Number:  Engineering geologist and Address:  [facility] [place], [place] [address] [place], [place] [address]      Provider Number: [id number]  Attending Physician Name and Address:  Rexene Alberts, MD  Relative Name and Phone Number:       Current Level of Care: Hospital Recommended Level of Care: [place] Prior Approval Number:    Date Approved/Denied:   PASRR Number: [id number]  Discharge Plan: SNF    Current Diagnoses: Patient Active Problem List   Diagnosis Date Noted  . S/P minimally invasive mitral valve repair 07/17/2016  . S/P patent foramen ovale closure 07/17/2016  . Chronic diastolic congestive heart failure ([place])   . Hypoxia   . Influenza B 05/20/2016  . COPD exacerbation ([place]) 05/19/2016  . COPD (chronic obstructive pulmonary disease) ([place]) 05/19/2016  . 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  . Constipation 08/05/2015  . Hospital discharge follow-up 08/05/2015  . Chronic respiratory failure with hypoxia ([place])   . Encounter for preventive health examination 03/14/2015  . Breast cancer screening 03/14/2015  . Need for hepatitis C screening test 03/14/2015  . Vitamin D deficiency 01/11/2015  . S/P hysterectomy with oophorectomy 09/22/2013  . Pulmonary hypertension ([place])   . Hypothyroidism 05/04/2012  . Severe mitral regurgitation   . Obesity (BMI 30-39.9) 08/04/2011  . Depression   . Hypertension   . Congenital absence of one kidney   . History of sciatica     Orientation RESPIRATION BLADDER Height & Weight     Self, Time, Situation, Place  Normal Continent Weight: 224 lb 6.9 oz (101.8 kg) Height:  5\' 2"  (157.5 cm)  BEHAVIORAL SYMPTOMS/MOOD NEUROLOGICAL BOWEL NUTRITION STATUS      Continent Diet (cardiac, carb modified)   AMBULATORY STATUS COMMUNICATION OF NEEDS Skin   Limited Assist Verbally Surgical wounds (chest wound- liquid skin adhesive)                       Personal Care Assistance Level of Assistance  Bathing, Dressing Bathing Assistance: Limited assistance   Dressing Assistance: Limited assistance     Functional Limitations Info             SPECIAL CARE FACTORS FREQUENCY  PT (By licensed PT), OT (By licensed OT)     PT Frequency: 5/wk OT Frequency: 5/wk            Contractures      Additional Factors Info  Code Status, Allergies Code Status Info: FULL Allergies Info: Codeine, Levaquin Levofloxacin, Penicillins, Zithromax Azithromycin, Prednisone           Current Medications (07/23/2016):  This is the current hospital active medication list Current Facility-Administered Medications  Medication Dose Route Frequency Provider Last Rate Last Dose  . 0.9 %  sodium chloride infusion  250 mL Intravenous Continuous Rexene Alberts, MD      . 0.9 %  sodium chloride infusion  250 mL Intravenous PRN Ivin Poot, MD      . aspirin EC tablet 81 mg  81 mg Oral Daily Rexene Alberts, MD   81 mg at 07/22/16 1114  . bisacodyl (DULCOLAX) EC tablet 10 mg  10 mg Oral Daily Rexene Alberts, MD   10 mg at 07/20/16 0957   Or  .  bisacodyl (DULCOLAX) suppository 10 mg  10 mg Rectal Daily Rexene Alberts, MD      . budesonide (PULMICORT) nebulizer solution 0.25 mg  0.25 mg Nebulization BID Rexene Alberts, MD   0.25 mg at 07/23/16 0740  . docusate sodium (COLACE) capsule 200 mg  200 mg Oral Daily Rexene Alberts, MD   200 mg at 07/22/16 1114  . enoxaparin (LOVENOX) injection 30 mg  30 mg Subcutaneous Q24H Rexene Alberts, MD   30 mg at 07/23/16 0834  . furosemide (LASIX) tablet 40 mg  40 mg Oral Daily Erin R Barrett, PA-C   40 mg at 07/22/16 1114  . ipratropium-albuterol (DUONEB) 0.5-2.5 (3) MG/3ML nebulizer solution 3 mL  3 mL Nebulization Q6H PRN Rexene Alberts, MD   3 mL at 07/22/16  2102  . levothyroxine (SYNTHROID, LEVOTHROID) tablet 112 mcg  112 mcg Oral QAC breakfast Rexene Alberts, MD   112 mcg at 07/23/16 [phone]  . metoCLOPramide (REGLAN) injection 10 mg  10 mg Intravenous Q6H PRN Erin R Barrett, PA-C   10 mg at 07/21/16 1811  . metoprolol (LOPRESSOR) injection 2.5-5 mg  2.5-5 mg Intravenous Q2H PRN Rexene Alberts, MD      . metoprolol tartrate (LOPRESSOR) tablet 25 mg  25 mg Oral BID Rexene Alberts, MD   25 mg at 07/22/16 2118  . montelukast (SINGULAIR) tablet 10 mg  10 mg Oral QHS Rexene Alberts, MD   10 mg at 07/22/16 2118  . nystatin-triamcinolone (MYCOLOG II) cream   Topical BID Rexene Alberts, MD      . ondansetron [facility]) injection 4 mg  4 mg Intravenous Q6H PRN Rexene Alberts, MD   4 mg at 07/21/16 1649  . oxyCODONE (Oxy IR/ROXICODONE) immediate release tablet 5-10 mg  5-10 mg Oral Q3H PRN Rexene Alberts, MD   5 mg at 07/20/16 0957  . pantoprazole (PROTONIX) EC tablet 40 mg  40 mg Oral Daily Rexene Alberts, MD   40 mg at 07/22/16 1114  . sodium chloride flush (NS) 0.9 % injection 3 mL  3 mL Intravenous Q12H Rexene Alberts, MD   3 mL at 07/22/16 2119  . sodium chloride flush (NS) 0.9 % injection 3 mL  3 mL Intravenous PRN Rexene Alberts, MD      . sodium chloride flush (NS) 0.9 % injection 3 mL  3 mL Intravenous Q12H Ivin Poot, MD   3 mL at 07/22/16 2118  . sodium chloride flush (NS) 0.9 % injection 3 mL  3 mL Intravenous PRN Ivin Poot, MD      . traMADol Veatrice [place]bon) tablet 50-100 mg  50-100 mg Oral Q4H PRN Rexene Alberts, MD   50 mg at 07/23/16 0135  . warfarin (COUMADIN) tablet 5 mg  5 mg Oral q1800 Ivin Poot, MD   5 mg at 07/22/16 1800  . Warfarin - Physician Dosing Inpatient   Does not apply [place], [facility]         Discharge Medications: Please see discharge summary for a list of discharge medications.  Relevant Imaging Results:  Relevant Lab Results:   Additional Information SS#: [id number]  Wende Neighbors,  LCSW

## 2016-07-23 NOTE — Progress Notes (Signed)
07/23/2016 12:47 PM Discharge AVS meds taken today and those due this evening reviewed.  Follow-up appointments and when to call md reviewed.  D/C IV and TELE.  Questions and concerns addressed.   D/C to SNF per orders.  Family is transporting pt to facility. Carney Corners

## 2016-07-23 NOTE — Progress Notes (Signed)
CARDIAC REHAB PHASE I   PRE:  Rate/Rhythm: 94 SR  BP:  Sitting: 104/72        SaO2: 97 RA  MODE:  Ambulation: 290 ft   POST:  Rate/Rhythm: 107 ST  BP:  Sitting: 117/67         SaO2: 96 RA  Pt ambulated 290 ft on RA, rollator, assist x1, slow, mostly steady gait, tolerated fairly well. Pt c/o DOE, denies any other complaints, seated rest x3, sats 96% on RA during ambulation. Cardiac surgery discharge education completed. Reviewed IS, restrictions, activity progression, exercise guidelines, s/s heart failure, daily weights, heart healthy diet and phase 2 cardiac rehab. Pt verbalized understanding. Pt agrees to phase 2 cardiac rehab referral, will send to [facility] per pt request. Pt to recliner after walk, feet elevated, call bell within reach.   [phone] Lenna Sciara, RN, BSN 07/23/2016 10:31 AM

## 2016-07-23 NOTE — Discharge Instructions (Signed)
1. Please obtain vital signs at least one time daily 2.Please weigh the patient daily. If he or she continues to gain weight or develops lower extremity edema, contact the office at [phone].  3. Ambulate patient at least three times daily and please use sternal precautions.    Mitral Valve Repair, Care After This sheet gives you information about how to care for yourself after your procedure. Your health care provider may also give you more specific instructions. If you have problems or questions, contact your health care provider. What can I expect after the procedure? After the procedure, it is common to have:  Pain at the incision area that may last for several weeks. Follow these instructions at home: Incision care   Follow instructions from your health care provider about how to take care of your incision. Make sure you:  Wash your hands with soap and water before you change your bandage (dressing). If soap and water are not available, use hand sanitizer.  Change your dressing as told by your health care provider.  Leave stitches (sutures), skin glue, or adhesive strips in place. These skin closures may need to stay in place for 2 weeks or longer. If adhesive strip edges start to loosen and curl up, you may trim the loose edges. Do not remove adhesive strips completely unless your health care provider tells you to do that.  Check your incision area every day for signs of infection. Check for:  More redness, swelling, or pain.  More fluid or blood.  Warmth.  Pus or a bad smell.   Do not apply powder or lotion to the area. Driving   Do not drive until your health care provider approves.  Do not drive or use heavy machinery while taking prescription pain medicines. Bathing   Do not take baths, swim, or use a hot tub for 2-4 weeks after surgery, or until your health care provider approves. Ask your health care provider if you may take showers.  To wash the incision  site, gently wash with soap and water and pat the area dry with a clean towel. Do not rub the incision area. That may cause bleeding. Activity   Rest as told by your health care provider. Ask your health care provider when you can resume normal activities, including sexual activity.  Avoid the following activities for 6-8 weeks, or as long as directed:  Lifting anything that is heavier than 10 lb (4.5 kg), or the limit that your health care provider tells you.  Pushing or pulling things with your arms.  Avoid climbing stairs and using the handrail to pull yourself up for the first 2-3 weeks after surgery.  Avoid airplane travel for 4-6 weeks, or as long as directed.  Avoid sitting for long periods of time and crossing your legs. Get up and move around at least once every 1-2 hours.  If you are taking blood thinners (anticoagulants), avoid activities that have a high risk of injury. Ask your health care provider what activities are safe for you. Lifestyle   Limit alcohol intake to no more than 1 drink a day for nonpregnant women and 2 drinks a day for men. One drink equals 12 oz of beer, 5 oz of wine, or 1 oz of hard liquor.  Do not use any products that contain nicotine or tobacco, such as cigarettes and e-cigarettes. If you need help quitting, ask your health care provider. General instructions   Take your temperature every day and weigh  yourself every morning for the first 7 days after surgery. Write your temperatures and weight down and take this record with you to any follow-up visits.  Take over-the-counter and prescription medicines only as told by your health care provider.  To prevent or treat constipation while you are taking prescription pain medicine, your health care provider may recommend that you:  Drink enough fluid to keep your urine clear or pale yellow.  Take over-the-counter or prescription medicines.  Eat foods that are high in fiber, such as fresh fruits and  vegetables, whole grains, and beans.  Limit foods that are high in fat and processed sugars, such as fried and sweet foods.  Follow instructions from your health care provider about eating or drinking restrictions.  Wear compression stockings for at least 2 weeks, or as long as told by your health care provider. These stockings help to prevent blood clots and reduce swelling in your legs. If your ankles are swollen after 2 weeks, continue to wear the stockings.  Keep all follow-up visits as told by your health care provider. This is important. Contact a health care provider if:  You develop a skin rash.  Your weight is increasing each day over 2-3 days.  You gain 2 lb (1 kg) or more in a single day.  You have a fever. Get help right away if:  You develop chest pain that feels different from the pain caused by your incision.  You develop shortness of breath or difficulty breathing.  You have more redness, swelling, or pain around your incision.  You have more fluid or blood coming from your incision.  Your incision feels warm to the touch.  You have pus or a bad smell coming from your incision.  You feel light-headed. This information is not intended to replace advice given to you by your health care provider. Make sure you discuss any questions you have with your health care provider. Document Released: 10/04/2004 Document Revised: 12/28/2015 Document Reviewed: 12/28/2015 Elsevier Interactive Patient Education  2017 [place].  =====================================================================================  Information on my medicine - Coumadin   (Warfarin)  This medication education was reviewed with me or my healthcare representative as part of my discharge preparation.  Why was Coumadin prescribed for you? Coumadin was prescribed for you because you have a blood clot or a medical condition that can cause an increased risk of forming blood clots. Blood clots  can cause serious health problems by blocking the flow of blood to the heart, lung, or brain. Coumadin can prevent harmful blood clots from forming. As a reminder your indication for Coumadin is:   Blood Clot Prevention After Heart Valve Surgery and recent atrial fibrillation What test will check on my response to Coumadin? While on Coumadin (warfarin) you will need to have an INR test regularly to ensure that your dose is keeping you in the desired range. The INR (international normalized ratio) number is calculated from the result of the laboratory test called prothrombin time (PT).  If an INR APPOINTMENT HAS NOT ALREADY BEEN MADE FOR YOU please schedule an appointment to have this lab work done by your health care provider within 7 days. Your INR goal is usually a number between:  2 to 3 or your provider may give you a more narrow range like 2-2.5.  Ask your health care provider during an office visit what your goal INR is.  What  do you need to  know  About  COUMADIN? Take Coumadin (warfarin) exactly as  prescribed by your healthcare provider about the same time each day.  DO NOT stop taking without talking to the doctor who prescribed the medication.  Stopping without other blood clot prevention medication to take the place of Coumadin may increase your risk of developing a new clot or stroke.  Get refills before you run out.  What do you do if you miss a dose? If you miss a dose, take it as soon as you remember on the same day then continue your regularly scheduled regimen the next day.  Do not take two doses of Coumadin at the same time.  Important Safety Information A possible side effect of Coumadin (Warfarin) is an increased risk of bleeding. You should call your healthcare provider right away if you experience any of the following: ? Bleeding from an injury or your nose that does not stop. ? Unusual colored urine (red or dark brown) or unusual colored stools (red or black). ? Unusual  bruising for unknown reasons. ? A serious fall or if you hit your head (even if there is no bleeding).  Some foods or medicines interact with Coumadin (warfarin) and might alter your response to warfarin. To help avoid this: ? Eat a balanced diet, maintaining a consistent amount of Vitamin K. ? Notify your provider about major diet changes you plan to make. ? Avoid alcohol or limit your intake to 1 drink for women and 2 drinks for men per day. (1 drink is 5 oz. wine, 12 oz. beer, or 1.5 oz. liquor.)  Make sure that ANY health care provider who prescribes medication for you knows that you are taking Coumadin (warfarin).  Also make sure the healthcare provider who is monitoring your Coumadin knows when you have started a new medication including herbals and non-prescription products.  Coumadin (Warfarin)  Major Drug Interactions  Increased Warfarin Effect Decreased Warfarin Effect  Alcohol (large quantities) Antibiotics (esp. Septra/Bactrim, Flagyl, Cipro) Amiodarone (Cordarone) Aspirin (ASA) Cimetidine (Tagamet) Megestrol (Megace) NSAIDs (ibuprofen, naproxen, etc.) Piroxicam (Feldene) Propafenone (Rythmol SR) Propranolol (Inderal) Isoniazid (INH) Posaconazole (Noxafil) Barbiturates (Phenobarbital) Carbamazepine (Tegretol) Chlordiazepoxide (Librium) Cholestyramine (Questran) Griseofulvin Oral Contraceptives Rifampin Sucralfate (Carafate) Vitamin K   Coumadin (Warfarin) Major Herbal Interactions  Increased Warfarin Effect Decreased Warfarin Effect  Garlic Ginseng Ginkgo biloba Coenzyme Q10 Green tea St. Johns wort    Coumadin (Warfarin) FOOD Interactions  Eat a consistent number of servings per week of foods HIGH in Vitamin K (1 serving =  cup)  Collards (cooked, or boiled & drained) Kale (cooked, or boiled & drained) Mustard greens (cooked, or boiled & drained) Parsley *serving size only =  cup Spinach (cooked, or boiled & drained) Swiss chard (cooked, or boiled  & drained) Turnip greens (cooked, or boiled & drained)  Eat a consistent number of servings per week of foods MEDIUM-HIGH in Vitamin K (1 serving = 1 cup)  Asparagus (cooked, or boiled & drained) Broccoli (cooked, boiled & drained, or raw & chopped) Brussel sprouts (cooked, or boiled & drained) *serving size only =  cup Lettuce, raw (green leaf, endive, romaine) Spinach, raw Turnip greens, raw & chopped   These websites have more information on Coumadin (warfarin):  FailFactory.se; VeganReport.com.au;

## 2016-07-23 NOTE — Progress Notes (Signed)
Clinical Social Worker facilitated patient discharge including contacting patient family and facility to confirm patient discharge plans.  Clinical information faxed to facility and family agreeable with plan. Patient stated that her father will take her to facility.  RN Juliann Pulse to call [phone] (pt will go to [id number]) report prior to discharge.  Clinical Social Worker will sign off for now as social work intervention is no longer needed. Please consult [place] again if new need arises.  Rhea Pink, MSW, [place]

## 2016-07-23 NOTE — Progress Notes (Signed)
07/23/2016 1:01 PM  Report called to [facility]. [place]

## 2016-07-23 NOTE — Care Management Note (Signed)
Case Management Note Marvetta Gibbons RN, BSN Unit 2W-Case Manager [phone]  Patient Details  Name: Lauren Bartlett MRN: [id number] Date of Birth: 1955/06/06  Subjective/Objective:   Pt admitted s/p mini MVR                Action/Plan: PTA pt lived at home alone, has family members nearby, per PT eval recommendations for SNF- CSW has been consulted for possible SNF placement  Expected Discharge Date:  07/23/16               Expected Discharge Plan:  [place]  In-House Referral:  Clinical Social Work  Discharge planning Services  CM Consult  Post Acute Care Choice:  NA Choice offered to:  NA  DME Arranged:    DME Agency:     HH Arranged:    [place] Agency:     Status of Service:  Completed, signed off  If discussed at [employer], dates discussed:    Discharge Disposition: skilled facility   Additional Comments:  07/23/16- 0945- Marvetta Gibbons RN, CM- pt for d/c today to SNF- CSW following for placement needs-   Dahlia Client, Romeo Rabon, RN 07/23/2016, 9:46 AM

## 2016-07-24 DIAGNOSIS — M6281 Muscle weakness (generalized): Secondary | ICD-10-CM | POA: Diagnosis not present

## 2016-07-24 DIAGNOSIS — I509 Heart failure, unspecified: Secondary | ICD-10-CM | POA: Diagnosis not present

## 2016-07-24 DIAGNOSIS — J441 Chronic obstructive pulmonary disease with (acute) exacerbation: Secondary | ICD-10-CM | POA: Diagnosis not present

## 2016-07-24 DIAGNOSIS — I10 Essential (primary) hypertension: Secondary | ICD-10-CM | POA: Diagnosis not present

## 2016-07-24 DIAGNOSIS — Z952 Presence of prosthetic heart valve: Secondary | ICD-10-CM | POA: Diagnosis not present

## 2016-07-27 DIAGNOSIS — N61 Mastitis without abscess: Secondary | ICD-10-CM | POA: Diagnosis not present

## 2016-07-30 DIAGNOSIS — J441 Chronic obstructive pulmonary disease with (acute) exacerbation: Secondary | ICD-10-CM | POA: Diagnosis not present

## 2016-07-30 DIAGNOSIS — I10 Essential (primary) hypertension: Secondary | ICD-10-CM | POA: Diagnosis not present

## 2016-07-30 DIAGNOSIS — I509 Heart failure, unspecified: Secondary | ICD-10-CM | POA: Diagnosis not present

## 2016-07-30 DIAGNOSIS — Z952 Presence of prosthetic heart valve: Secondary | ICD-10-CM | POA: Diagnosis not present

## 2016-07-30 DIAGNOSIS — E039 Hypothyroidism, unspecified: Secondary | ICD-10-CM | POA: Diagnosis not present

## 2016-07-31 ENCOUNTER — Other Ambulatory Visit: Payer: Self-pay | Admitting: *Deleted

## 2016-07-31 NOTE — Patient Outreach (Signed)
[facility]) Care Management  07/31/2016  Lauren Bartlett 1955/10/25 [id number]   Met with patient at facility. Patient has history of COPD and recent CABG. She has had multiple ED and hospitalizations. Chronic risk score is 96.   Patient reports she lives alone, her dad transports her to appointments. She was managing her medications without issue, knows she will have new medications upon discharge.  She thinks she will discharge in the next 1-2 weeks, but at least by 08/12/16 due to co-pay days.   RNCM reviewed [facility] care management, gave brochure. Patient wants to review information, she thinks we may have called her recently but is not sure.  Plan to follow up at next facility visit.   Royetta Crochet. Laymond Purser, RN, BSN, [place] [phone]) Business Cell  [phone]) Toll Free Office

## 2016-08-01 ENCOUNTER — Other Ambulatory Visit: Payer: Self-pay | Admitting: Thoracic Surgery (Cardiothoracic Vascular Surgery)

## 2016-08-01 DIAGNOSIS — Z9889 Other specified postprocedural states: Secondary | ICD-10-CM

## 2016-08-04 ENCOUNTER — Ambulatory Visit
Admission: RE | Admit: 2016-08-04 | Discharge: 2016-08-04 | Disposition: A | Discharge status: Home / Self Care | Payer: Medicare Other | Source: Ambulatory Visit | Attending: Thoracic Surgery (Cardiothoracic Vascular Surgery) | Admitting: Thoracic Surgery (Cardiothoracic Vascular Surgery)

## 2016-08-04 ENCOUNTER — Ambulatory Visit (INDEPENDENT_AMBULATORY_CARE_PROVIDER_SITE_OTHER): Payer: Self-pay | Admitting: Physician Assistant

## 2016-08-04 VITALS — BP 104/69 | HR 84 | Resp 20 | Ht 62.0 in | Wt 219.0 lb

## 2016-08-04 DIAGNOSIS — Z9889 Other specified postprocedural states: Secondary | ICD-10-CM

## 2016-08-04 DIAGNOSIS — J9811 Atelectasis: Secondary | ICD-10-CM | POA: Diagnosis not present

## 2016-08-04 DIAGNOSIS — I340 Nonrheumatic mitral (valve) insufficiency: Secondary | ICD-10-CM

## 2016-08-04 MED ORDER — METOPROLOL TARTRATE 25 MG PO TABS: 12.5000 mg | ORAL_TABLET | Freq: Two times a day (BID) | ORAL | Status: DC

## 2016-08-04 NOTE — Patient Instructions (Signed)
Endocarditis is a potentially serious infection of heart valves or inside lining of the heart.  It occurs more commonly in patients with diseased heart valves (such as patient's with aortic or mitral valve disease) and in patients who have undergone heart valve repair or replacement.  Certain surgical and dental procedures may put you at risk, such as dental cleaning, other dental procedures, or any surgery involving the respiratory, urinary, gastrointestinal tract, gallbladder or prostate gland.   To minimize your chances for develooping endocarditis, maintain good oral health and seek prompt medical attention for any infections involving the mouth, teeth, gums, skin or urinary tract.    Always notify your doctor or dentist about your underlying heart valve condition before having any invasive procedures. You will need to take antibiotics before certain procedures, including all routine dental cleanings or other dental procedures.  Your cardiologist or dentist should prescribe these antibiotics for you to be taken ahead of time.  Make every effort to maintain a "heart-healthy" lifestyle with regular physical exercise and adherence to a low-fat, low-carbohydrate diet.  Continue to seek regular follow-up appointments with your primary care physician and/or cardiologist.  You may continue to gradually increase your physical activity as tolerated.  Refrain from any heavy lifting or strenuous use of your arms and shoulders until at least 8 weeks from the time of your surgery, and avoid activities that cause increased pain in your chest on the side of your surgical incision.  Otherwise you may continue to increase activities without any particular limitations.  Increase the intensity and duration of physical activity gradually.       

## 2016-08-04 NOTE — Progress Notes (Signed)
HPI:  Patient returns for routine postoperative follow-up having undergone Minimally Invasive Mitral Valve Repair on 07/17/2016. The patient's early postoperative recovery while in the hospital was notable for brief episodes of Atrial Fibrillation.  She developed prolonged Qt and nausea and Amiodarone was ultimately discontinued. Deconditioning requiring SNF placement. Since hospital discharge the patient reports she thinks she is making progress.   She is concerned about her blood pressure being too low.  She states she does not feel very well when her blood pressure gets below 100.  She is still at the SNF facility and believes she will be discharged on 5/15.  She continues to participate with PT/OT and states she is to ambulate some on her own now.  She does ask if she can drive when she leaves the facility.  She is not sure what her INR stating the facility is checking it and faxing it to the the cardiologist office.   Current Outpatient Prescriptions  Medication Sig Dispense Refill  . acetaminophen (TYLENOL) 500 MG tablet Take 500 mg by mouth 2 (two) times daily as needed for moderate pain or headache.    . albuterol (PROVENTIL HFA;VENTOLIN HFA) 108 (90 Base) MCG/ACT inhaler Inhale 2 puffs into the lungs every 6 (six) hours as needed for wheezing or shortness of breath. Pt uses with SPACER.    [name] albuterol (PROVENTIL) (2.5 MG/3ML) 0.083% nebulizer solution Take 2.5 mg by nebulization every 6 (six) hours as needed for wheezing or shortness of breath.    [name] aspirin EC 81 MG EC tablet Take 1 tablet (81 mg total) by mouth daily.    . benzonatate (TESSALON) 200 MG capsule Take 1 capsule (200 mg total) by mouth 3 (three) times daily as needed for cough. 60 capsule 1  . famotidine (PEPCID AC) 10 MG chewable tablet Chew 10 mg by mouth daily as needed for heartburn.    . ferrous sulfate 325 (65 FE) MG tablet Take 325 mg by mouth daily with breakfast.    . fluticasone (FLONASE) 50 MCG/ACT nasal spray Place 2  sprays into both nostrils daily.    . fluticasone (FLOVENT HFA) 110 MCG/ACT inhaler Inhale 1 puff into the lungs 2 (two) times daily.    . furosemide (LASIX) 40 MG tablet Take 1 tablet (40 mg total) by mouth daily. 30 tablet 0  . hydroxypropyl methylcellulose / hypromellose (ISOPTO TEARS / GONIOVISC) 2.5 % ophthalmic solution Place 1 drop into both eyes 2 (two) times daily.    . Hypromellose (ISOPTO TEARS) 0.5 % SOLN Apply 1 drop to eye 2 (two) times daily.    [name] ipratropium (ATROVENT) 0.03 % nasal spray USE 2 SPRAYS NASALLY THREE TIMES DAILY 90 mL 1  . levothyroxine (SYNTHROID, LEVOTHROID) 112 MCG tablet Take 112 mcg by mouth daily before breakfast.    . loratadine-pseudoephedrine (CLARITIN-D 24-HOUR) 10-240 MG 24 hr tablet Take 1 tablet by mouth daily.    . metoprolol tartrate (LOPRESSOR) 25 MG tablet Take 0.5 tablets (12.5 mg total) by mouth 2 (two) times daily.    . montelukast (SINGULAIR) 10 MG tablet Take 1 tablet (10 mg total) by mouth at bedtime. 30 tablet 3  . OXYGEN Inhale 2 L into the lungs at bedtime as needed (at bedtime and as needed).    . traMADol (ULTRAM) 50 MG tablet Take 1-2 tablets (50-100 mg total) by mouth every 4 (four) hours as needed for moderate pain. 30 tablet 0  . umeclidinium-vilanterol (ANORO ELLIPTA) 62.5-25 MCG/INH AEPB Inhale 1 puff into the  lungs daily. 1 each 11  . vitamin B-12 (CYANOCOBALAMIN) 1000 MCG tablet Take 1,000 mcg by mouth daily.    [name] warfarin (COUMADIN) 5 MG tablet Take 1 tablet (5 mg total) by mouth daily at 6 PM.    . LORazepam (ATIVAN) 0.5 MG tablet Take 1 tablet (0.5 mg total) by mouth every 8 (eight) hours as needed for anxiety. (Patient not taking: Reported on 08/04/2016) 30 tablet 0  . polyethylene glycol powder (GLYCOLAX/MIRALAX) powder Take 17 g by mouth 2 (two) times daily as needed. (Patient taking differently: Take 17 g by mouth daily as needed for mild constipation. ) 3350 g 1   No current facility-administered medications for this visit.      Physical Exam:  BP 104/69   Pulse 84   Resp 20   Ht 5\' 2"  (1.575 m)   Wt 219 lb (99.3 kg)   SpO2 99% Comment: 2L O2 per [place]  BMI 40.06 kg/m   Gen: no apparent distress, looks good, on oxygen which is chronic for her Heart: RRR Lungs: CTA bilaterally Abd: soft non-tender, non-distended Ext: mild edema Incisions: Thoracotomy is healing well.[name] Her Chest tube sites are under her right breast, there is early skin yeast present, R groin wound is healing well  Diagnostic Tests:  CXR: stable appearance of right hemidiaphragm, no pleural effusions/pneumothorax   A/P:  1. S/P Mini MV Repair- patient is doing well- continue coumadin for mitral valve repair, patient instructed to contact [place] coumadin clinic to arrange continued PT/INR follow from SNF discharge 2. CV- Hypotension, BP was 104/69 in the office today- will decrease Lopressor to 12.5 mg BID 3. Yeast Infection- under right breast, instructed facility to place Nystatin powder, and daily wound care of chest tube sites 4. F/U with Cardiology on 08/15/2016 5. Dispo- patient doing well, getting stronger as time goes, decreased Lopressor due to hypotension.. Will have patient RTC in July  Laquan Beier, [facility] [phone]

## 2016-08-07 ENCOUNTER — Other Ambulatory Visit: Payer: Self-pay | Admitting: *Deleted

## 2016-08-07 ENCOUNTER — Ambulatory Visit: Payer: Medicare Other | Admitting: Pulmonary Disease

## 2016-08-07 DIAGNOSIS — J441 Chronic obstructive pulmonary disease with (acute) exacerbation: Secondary | ICD-10-CM | POA: Diagnosis not present

## 2016-08-07 DIAGNOSIS — M6281 Muscle weakness (generalized): Secondary | ICD-10-CM | POA: Diagnosis not present

## 2016-08-07 DIAGNOSIS — Z952 Presence of prosthetic heart valve: Secondary | ICD-10-CM | POA: Diagnosis not present

## 2016-08-07 DIAGNOSIS — I10 Essential (primary) hypertension: Secondary | ICD-10-CM | POA: Diagnosis not present

## 2016-08-07 DIAGNOSIS — I509 Heart failure, unspecified: Secondary | ICD-10-CM | POA: Diagnosis not present

## 2016-08-07 NOTE — Patient Outreach (Signed)
Spoke with patient.  Patient reporting she is going home on Monday 08/11/16. Patient states her Dad will provide transportation until she can be released to drive. She will get 30 days of medication upon discharge. She will get home care services.  Patient reports she uses oxygen at night for COPD and has O2 at home.  RNCM reviewed care management services.  Patient reviewed brochure left with her last visit, she feels she can benefit from extra support and education that can be provided by [facility] care management. Verbal consent received from patient.   Plan to place referral for [place] for transition of care.   Royetta Crochet. Laymond Purser, RN, BSN, [place] [phone]) Business Cell  [phone]) Toll Free Office

## 2016-08-11 ENCOUNTER — Telehealth: Payer: Self-pay | Admitting: *Deleted

## 2016-08-11 NOTE — Telephone Encounter (Signed)
Patient will discharge from [facility] on 05/14. Please call pt with an appt. Time and date for a rehab follow up.  Pt contact [phone]

## 2016-08-11 NOTE — Telephone Encounter (Signed)
Please advise 

## 2016-08-12 ENCOUNTER — Telehealth: Payer: Self-pay | Admitting: *Deleted

## 2016-08-12 ENCOUNTER — Other Ambulatory Visit: Payer: Self-pay | Admitting: *Deleted

## 2016-08-12 DIAGNOSIS — L03313 Cellulitis of chest wall: Secondary | ICD-10-CM | POA: Diagnosis not present

## 2016-08-12 DIAGNOSIS — R262 Difficulty in walking, not elsewhere classified: Secondary | ICD-10-CM | POA: Diagnosis not present

## 2016-08-12 DIAGNOSIS — I10 Essential (primary) hypertension: Secondary | ICD-10-CM | POA: Diagnosis not present

## 2016-08-12 DIAGNOSIS — Z952 Presence of prosthetic heart valve: Secondary | ICD-10-CM | POA: Diagnosis not present

## 2016-08-12 DIAGNOSIS — M6281 Muscle weakness (generalized): Secondary | ICD-10-CM | POA: Diagnosis not present

## 2016-08-12 DIAGNOSIS — R040 Epistaxis: Secondary | ICD-10-CM | POA: Diagnosis not present

## 2016-08-12 NOTE — Telephone Encounter (Signed)
[facility] started care today following discharge from [facility]. RN visits frequencies will be 2 times a week for 1 week and 1 times a week for 2 week.  Contact [phone]

## 2016-08-12 NOTE — Telephone Encounter (Signed)
Transition Care Management Follow-up Telephone Call  How have you been since you were released from the hospital? Feels a a lot better stated by patient I am even walking better.   Do you understand why you were in the hospital?Yes  Do you understand the discharge instrcutions? Yes Items Reviewed:  Medications reviewed: Yes, Added B 12 once weekly at [facility].and added coumadin for 6 weeks added Metoprolol. Patient tking ASA with coumadin?  Allergies reviewed: Yes  Dietary changes reviewed: Yes, Low sodium  Referrals reviewed:Yes,   Functional Questionnaire:   Activities of Daily Living (ADLs):   She states they are independent in the following: Requires assist with clothing and bathing with all ADls. States they require assistance with the following: With bathing and dressing.   Any transportation issues/concerns?: no   Any patient concerns? Yes, medications updated with PCP chart has been updated.   Confirmed importance and date/time of follow-up visits scheduled: Yes   Confirmed with patient if condition begins to worsen call PCP or go to the ER.  Patient was given the Call-a-Nurse line [phone]: yes.

## 2016-08-12 NOTE — Patient Outreach (Signed)
Transition of care call completed, follow up on referral from [facility] acute care coordinator [facility] RN 5/10  to follow pt post SNF discharge- recent SNF discharge 5/14 after hospitalization 4/19-4/25 for mitral valve repair,chronic diastolic CHF,COPD exacerbation. Spoke with pt, HIPAA/identity verified, discussed purpose of call- follow up on referral for transition of care.  Pt reports doing good since discharge home, no c/o sob, using O2 2 Liters [place] at night.   Pt reports lives alone, has an aide every day/3-4 hours a day, assists with bath, dishes/washing clothes. . Pt reports weighs daily but did not do it today.  Pt reports cannot get rest, so many people calling.   Pt reports to see Dr. Derrel Nip 5/17 and Heart NP 5/18, can drive herself to MD appointments.  Pt reports taking on her own all  of her medications as ordered per discharge papers from SNF ([facility]), RN unable to review due to unable to view SNF discharge. RN CM discussed with pt [facility] transition of care program- follow 31 days (weekly phone calls, a home visit) no cost.     Plan:  As discussed with pt, plan to follow up again next week - initial home visit (part of ongoing           Transition of care).          Barrier letter sent to Dr. Derrel Nip by in basket- informing of [facility] involvement.    Zara Chess.   [place] Care Management  [phone]

## 2016-08-13 ENCOUNTER — Ambulatory Visit (INDEPENDENT_AMBULATORY_CARE_PROVIDER_SITE_OTHER): Payer: Medicare Other | Admitting: Cardiology

## 2016-08-13 ENCOUNTER — Telehealth: Payer: Self-pay | Admitting: Cardiovascular Disease

## 2016-08-13 ENCOUNTER — Emergency Department
Admission: EM | Admit: 2016-08-13 | Discharge: 2016-08-13 | Disposition: A | Discharge status: Home / Self Care | Payer: Medicare Other | Attending: Emergency Medicine | Admitting: Emergency Medicine

## 2016-08-13 ENCOUNTER — Telehealth: Payer: Self-pay | Admitting: Internal Medicine

## 2016-08-13 ENCOUNTER — Encounter: Payer: Self-pay | Admitting: Emergency Medicine

## 2016-08-13 DIAGNOSIS — Z5181 Encounter for therapeutic drug level monitoring: Secondary | ICD-10-CM

## 2016-08-13 DIAGNOSIS — Z8774 Personal history of (corrected) congenital malformations of heart and circulatory system: Secondary | ICD-10-CM

## 2016-08-13 DIAGNOSIS — Z7901 Long term (current) use of anticoagulants: Secondary | ICD-10-CM | POA: Diagnosis not present

## 2016-08-13 DIAGNOSIS — Z952 Presence of prosthetic heart valve: Secondary | ICD-10-CM | POA: Diagnosis not present

## 2016-08-13 DIAGNOSIS — Z7982 Long term (current) use of aspirin: Secondary | ICD-10-CM | POA: Insufficient documentation

## 2016-08-13 DIAGNOSIS — I110 Hypertensive heart disease with heart failure: Secondary | ICD-10-CM | POA: Diagnosis not present

## 2016-08-13 DIAGNOSIS — I5032 Chronic diastolic (congestive) heart failure: Secondary | ICD-10-CM | POA: Insufficient documentation

## 2016-08-13 DIAGNOSIS — R040 Epistaxis: Secondary | ICD-10-CM | POA: Diagnosis not present

## 2016-08-13 DIAGNOSIS — J449 Chronic obstructive pulmonary disease, unspecified: Secondary | ICD-10-CM | POA: Diagnosis not present

## 2016-08-13 DIAGNOSIS — E039 Hypothyroidism, unspecified: Secondary | ICD-10-CM | POA: Insufficient documentation

## 2016-08-13 DIAGNOSIS — R262 Difficulty in walking, not elsewhere classified: Secondary | ICD-10-CM | POA: Diagnosis not present

## 2016-08-13 DIAGNOSIS — I10 Essential (primary) hypertension: Secondary | ICD-10-CM | POA: Diagnosis not present

## 2016-08-13 DIAGNOSIS — Z79899 Other long term (current) drug therapy: Secondary | ICD-10-CM | POA: Diagnosis not present

## 2016-08-13 DIAGNOSIS — Z9889 Other specified postprocedural states: Secondary | ICD-10-CM

## 2016-08-13 DIAGNOSIS — M6281 Muscle weakness (generalized): Secondary | ICD-10-CM | POA: Diagnosis not present

## 2016-08-13 DIAGNOSIS — L03313 Cellulitis of chest wall: Secondary | ICD-10-CM | POA: Diagnosis not present

## 2016-08-13 LAB — PROTIME-INR
INR: 3.07
Prothrombin Time: 32.4 seconds — ABNORMAL HIGH (ref 11.4–15.2)

## 2016-08-13 LAB — CBC
HEMATOCRIT: 31.7 % — AB (ref 35.0–47.0)
Hemoglobin: 10.4 g/dL — ABNORMAL LOW (ref 12.0–16.0)
MCH: 28.4 pg (ref 26.0–34.0)
MCHC: 32.8 g/dL (ref 32.0–36.0)
MCV: 86.6 fL (ref 80.0–100.0)
PLATELETS: 225 10*3/uL (ref 150–440)
RBC: 3.66 MIL/uL — ABNORMAL LOW (ref 3.80–5.20)
RDW: 16.9 % — AB (ref 11.5–14.5)
WBC: 8.6 10*3/uL (ref 3.6–11.0)

## 2016-08-13 MED ORDER — CLINDAMYCIN HCL 150 MG PO CAPS
150.0000 mg | ORAL_CAPSULE | Freq: Once | ORAL | Status: AC
  Administered 2016-08-13: 150 mg via ORAL
  Filled 2016-08-13: qty 1

## 2016-08-13 MED ORDER — OXYMETAZOLINE HCL 0.05 % NA SOLN
1.0000 | Freq: Once | NASAL | Status: AC
  Administered 2016-08-13: 1 via NASAL

## 2016-08-13 MED ORDER — ACETAMINOPHEN 500 MG PO TABS
1000.0000 mg | ORAL_TABLET | Freq: Once | ORAL | Status: AC
  Administered 2016-08-13: 1000 mg via ORAL
  Filled 2016-08-13: qty 2

## 2016-08-13 MED ORDER — CLINDAMYCIN HCL 150 MG PO CAPS: 150.0000 mg | ORAL_CAPSULE | Freq: Four times a day (QID) | ORAL | 0 refills | Status: AC

## 2016-08-13 MED ORDER — OXYCODONE-ACETAMINOPHEN 5-325 MG PO TABS
1.0000 | ORAL_TABLET | Freq: Once | ORAL | Status: DC
  Filled 2016-08-13: qty 1

## 2016-08-13 NOTE — Telephone Encounter (Addendum)
Returned a call to the pt & she states she was seen in the ER last night for a nose bleed. Advised that since she is a new pt that we need to see her complete pt education in the office but pt states she is unable to come out because she has not had any sleep & not feeling well. The pt started crying over the phone because she stated she could not come  After calming the pt down educated pt on the importance of coming to the [facility] for intial appt but pt states she won't be able to come today. She stated will have Home Health on tomorrow and if the can check it on their visit. Advised that since she can't come into the office that I would call [facility] & see if they can check the pt tomorrow.   Called [facility] & spoke with Evelena Peat, RN who stated that the pt has called him in reference to this & that I could give him a verbal order for INR check tomorrow. Therefore, a verbal order was given to Evelena Peat to check an INR on tomorrow.   Called the pt back & instructed that since the INR was 3.07 in the ER this am; instructed her to only take 2.5mg  (1/2 tablet) today then resume 5mg  (1 tablet) daily. Advised that we have to check her weekly & if [place] will not come out on INR recheck date she has to come into the office & she stated she would but today she just could not come.   Per Hospital d/c records on 07/17/16-07/23/16 the pt's goal is 2.5-3.0

## 2016-08-13 NOTE — Telephone Encounter (Signed)
Dn Johnson from [facility] called and is requesting OT 1x for 1 week, 0x 2 weeks, 1x 1 week. Also he is requesting ABL for transfers and exercise. Ok to discharge when goal met or potential. Please advise, thank you!  Call @ [phone]

## 2016-08-13 NOTE — ED Notes (Signed)
Dr Dahlia Client at bedside; pt reports onset bleeding again; packing removed from left nare & rapid rhino inserted; pt tolerated well

## 2016-08-13 NOTE — Telephone Encounter (Signed)
Is it ok to give a verbal ok?

## 2016-08-13 NOTE — ED Notes (Signed)
Pt declines percocet, stating that it "makes her sick"; MD notified

## 2016-08-13 NOTE — Telephone Encounter (Signed)
Pt states she went to the ED last night for a nose bleed and pt states they checked her coumadin there. She asks if she still needs to come in today. Please advise.

## 2016-08-13 NOTE — ED Notes (Signed)
Nasal packing placed to left nare by Dr Dahlia Client; pt tolerated well

## 2016-08-13 NOTE — ED Notes (Signed)
Pt up to room commode to void; no bleeding noted at present; no c/o voiced

## 2016-08-13 NOTE — ED Notes (Signed)
Pt reports discomfort to nose from packing 1/10; requesting tylenol for pain; MD notified and med admin; pt up to room commode to void qs; no bleeding noted at this time

## 2016-08-13 NOTE — ED Notes (Signed)
Scant amount bleeding noted from right nare; MD notified

## 2016-08-13 NOTE — Telephone Encounter (Signed)
FYI

## 2016-08-13 NOTE — Telephone Encounter (Signed)
Yes,  For orders of this kind (PT/OT) ,  Ok to  Give a verbal auth and send me an [facility]

## 2016-08-13 NOTE — ED Triage Notes (Signed)
Pt to room 2 via stretcher/EMS from home with active nosebleed since 10pm; bleeding noted from left nare; nose clamp applied; pt denies any recent illness or accomp symptoms; st was getting ready to go to bed and had sudden onset bleeding; st hx of same with packing and is currently taking coumadin (unknown amount); resp even/unlab, lungs clear, apical audible & regular, +BS, abd soft/nondist, +PP, -edema; Dr Dahlia Client at bedside; call bell in reach

## 2016-08-13 NOTE — ED Provider Notes (Signed)
[facility] Emergency Department Provider Note   ____________________________________________   First MD Initiated Contact with Patient 08/13/16 0017     (approximate)  I have reviewed the triage vital signs and the nursing notes.   HISTORY  Chief Complaint Epistaxis    HPI Lauren Bartlett is a 61 y.o. female who comes into the hospital today with a nosebleed. The patient is on Coumadin. The patient's nose started bleeding out of the left nostril at around 10 PM. The patient used to clamp from the last time she had a nosebleed but it was still bleeding through that. EMS report that they held the patient's nose to halt the bleeding until she arrived here. When they arrived in the ambulance. The patient started coughing up blood clots. The patient's blood pressure was 160/110 when EMS initially arrived but on her way here was in the 053 systolic. The patient does not remember the last time she had a nosebleed. She reports that she is supposed to get her Coumadin checked today so she is unsure what it is. The patient did have heart surgery 3 weeks ago. He denies any pain at this time. She still feels some blood going down the back of her throat.   Past Medical History:  Diagnosis Date  . Anxiety   . Bleeding from the nose   . Chronic diastolic congestive heart failure ([place])   . Congenital absence of one kidney   . COPD (chronic obstructive pulmonary disease) ([place])   . Depression    treated at [place]  . Dyspnea   . Emphysema/COPD ([place])   . GERD (gastroesophageal reflux disease)   . Heart murmur   . History of sciatica   . Hypertension   . hypothyrodism   . Hypothyroidism   . Motion sickness    cars  . MVP (mitral valve prolapse)   . Pneumonia   . PONV (postoperative nausea and vomiting)   . Pulmonary hypertension ([place])   . S/P patent foramen ovale closure 07/17/2016  . Severe mitral regurgitation   . Sleep apnea    O2 at night and PRN     Patient Active Problem List   Diagnosis Date Noted  . S/P minimally invasive mitral valve repair 07/17/2016  . S/P patent foramen ovale closure 07/17/2016  . Chronic diastolic congestive heart failure ([place])   . Hypoxia   . Influenza B 05/20/2016  . COPD exacerbation ([place]) 05/19/2016  . COPD (chronic obstructive pulmonary disease) ([place]) 05/19/2016  . 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  . Constipation 08/05/2015  . Hospital discharge follow-up 08/05/2015  . Chronic respiratory failure with hypoxia ([place])   . Encounter for preventive health examination 03/14/2015  . Breast cancer screening 03/14/2015  . Need for hepatitis C screening test 03/14/2015  . Vitamin D deficiency 01/11/2015  . S/P hysterectomy with oophorectomy 09/22/2013  . Pulmonary hypertension ([place])   . Hypothyroidism 05/04/2012  . Severe mitral regurgitation   . Obesity (BMI 30-39.9) 08/04/2011  . Depression   . Hypertension   . Congenital absence of one kidney   . History of sciatica     Past Surgical History:  Procedure Laterality Date  . ABDOMINAL HYSTERECTOMY    . BREAST BIOPSY Right 2011   [facility]< benign  . CATARACT EXTRACTION W/PHACO Left 11/14/2015   Procedure: CATARACT EXTRACTION PHACO AND INTRAOCULAR LENS PLACEMENT (IOC);  Surgeon: Leandrew Koyanagi, MD;  Location: [place];  Service: Ophthalmology;  Laterality: Left;  sleep apnea [place]  . CATARACT  EXTRACTION W/PHACO Right 12/19/2015   Procedure: CATARACT EXTRACTION PHACO AND INTRAOCULAR LENS PLACEMENT (IOC);  Surgeon: Leandrew Koyanagi, MD;  Location: [place];  Service: Ophthalmology;  Laterality: Right;  ANXIETY GENEROUS IV SEDATION TORIC LEN  . COMBINED HYSTERECTOMY ABDOMINAL W/ A&P REPAIR / OOPHORECTOMY  1996   benign tumor  . INNER EAR SURGERY     bilateral  . MITRAL VALVE REPAIR Right 07/17/2016   Procedure: MINIMALLY INVASIVE MITRAL VALVE REPAIR (MVR) USING 26 SORIN MEMO 3D ANNULOPLASTY RING;  Surgeon: Rexene Alberts, MD;   Location: [place];  Service: Open Heart Surgery;  Laterality: Right;  . PATENT FORAMEN OVALE(PFO) CLOSURE N/A 07/17/2016   Procedure: PATENT FORAMEN OVALE (PFO) CLOSURE;  Surgeon: Rexene Alberts, MD;  Location: [place];  Service: Open Heart Surgery;  Laterality: N/A;  . RIGHT/LEFT HEART CATH AND CORONARY ANGIOGRAPHY Bilateral 06/16/2016   Procedure: Right/Left Heart Cath and Coronary Angiography;  Surgeon: Wellington Hampshire, MD;  Location: [place] [facility];  Service: Cardiovascular;  Laterality: Bilateral;  . TEE WITHOUT CARDIOVERSION N/A 05/19/2016   Procedure: TRANSESOPHAGEAL ECHOCARDIOGRAM (TEE);  Surgeon: Wellington Hampshire, MD;  Location: [facility];  Service: Cardiovascular;  Laterality: N/A;  . TEE WITHOUT CARDIOVERSION N/A 06/09/2016   Procedure: TRANSESOPHAGEAL ECHOCARDIOGRAM (TEE);  Surgeon: Wellington Hampshire, MD;  Location: [facility];  Service: Cardiovascular;  Laterality: N/A;  . TEE WITHOUT CARDIOVERSION N/A 06/16/2016   Procedure: TRANSESOPHAGEAL ECHOCARDIOGRAM (TEE);  Surgeon: Wellington Hampshire, MD;  Location: [facility];  Service: Cardiovascular;  Laterality: N/A;  . TEE WITHOUT CARDIOVERSION N/A 07/17/2016   Procedure: TRANSESOPHAGEAL ECHOCARDIOGRAM (TEE);  Surgeon: Rexene Alberts, MD;  Location: [place];  Service: Open Heart Surgery;  Laterality: N/A;  . TONSILLECTOMY      Prior to Admission medications   Medication Sig Start Date End Date Taking? Authorizing Provider  acetaminophen (TYLENOL) 500 MG tablet Take 500 mg by mouth 2 (two) times daily as needed for moderate pain or headache.    [provider]  albuterol (PROVENTIL HFA;VENTOLIN HFA) 108 (90 Base) MCG/ACT inhaler Inhale 2 puffs into the lungs every 6 (six) hours as needed for wheezing or shortness of breath. Pt uses with SPACER.    [provider]  albuterol (PROVENTIL) (2.5 MG/3ML) 0.083% nebulizer solution Take 2.5 mg by nebulization every 6 (six) hours as needed for wheezing or shortness of breath.    [provider]  aspirin EC 81 MG EC tablet Take 1 tablet (81 mg total) by mouth daily. 07/23/16   Barrett, Erin R, PA-C  benzonatate (TESSALON) 200 MG capsule Take 1 capsule (200 mg total) by mouth 3 (three) times daily as needed for cough. 07/04/16   [name], MD  clindamycin (CLEOCIN) 150 MG capsule Take 1 capsule (150 mg total) by mouth 4 (four) times daily. 08/13/16 08/18/16  Loney Hering, MD  famotidine (PEPCID AC) 10 MG chewable tablet Chew 10 mg by mouth daily as needed for heartburn.    [provider]  ferrous sulfate 325 (65 FE) MG tablet Take 325 mg by mouth daily with breakfast.    [provider]  fluticasone (FLONASE) 50 MCG/ACT nasal spray Place 2 sprays into both nostrils daily.    [provider]  fluticasone (FLOVENT HFA) 110 MCG/ACT inhaler Inhale 1 puff into the lungs 2 (two) times daily.    [provider]  furosemide (LASIX) 40 MG tablet Take 1 tablet (40 mg total) by mouth daily. 07/23/16  Barrett, Erin R, PA-C  hydroxypropyl methylcellulose / hypromellose (ISOPTO TEARS / GONIOVISC) 2.5 % ophthalmic solution Place 1 drop into both eyes 2 (two) times daily.    [provider]  Hypromellose (ISOPTO TEARS) 0.5 % SOLN Apply 1 drop to eye 2 (two) times daily.    [provider]  ipratropium (ATROVENT) 0.03 % nasal spray USE 2 SPRAYS NASALLY THREE TIMES DAILY 06/23/16   Laverle Hobby, MD  levothyroxine (SYNTHROID, LEVOTHROID) 112 MCG tablet Take 112 mcg by mouth daily before breakfast.    [provider]  loratadine-pseudoephedrine (CLARITIN-D 24-HOUR) 10-240 MG 24 hr tablet Take 1 tablet by mouth daily.    [provider]  LORazepam (ATIVAN) 0.5 MG tablet Take 1 tablet (0.5 mg total) by mouth every 8 (eight) hours as needed for anxiety. Patient not taking: Reported on 08/04/2016 04/18/16 04/18/17  Earleen Newport, MD  metoprolol tartrate (LOPRESSOR) 25 MG tablet Take 0.5 tablets (12.5 mg total)  by mouth 2 (two) times daily. 08/04/16   Barrett, Erin R, PA-C  montelukast (SINGULAIR) 10 MG tablet Take 1 tablet (10 mg total) by mouth at bedtime. 06/30/16   [name], MD  OXYGEN Inhale 2 L into the lungs at bedtime as needed (at bedtime and as needed).    [provider]  polyethylene glycol powder (GLYCOLAX/MIRALAX) powder Take 17 g by mouth 2 (two) times daily as needed. Patient taking differently: Take 17 g by mouth daily as needed for mild constipation.  08/03/15   [name], MD  traMADol (ULTRAM) 50 MG tablet Take 1-2 tablets (50-100 mg total) by mouth every 4 (four) hours as needed for moderate pain. 07/23/16   Barrett, Erin R, PA-C  umeclidinium-vilanterol (ANORO ELLIPTA) 62.5-25 MCG/INH AEPB Inhale 1 puff into the lungs daily. 04/28/16   [name], MD  vitamin B-12 (CYANOCOBALAMIN) 1000 MCG tablet Take 1,000 mcg by mouth daily.    [provider]  warfarin (COUMADIN) 5 MG tablet Take 1 tablet (5 mg total) by mouth daily at 6 PM. 07/23/16   Barrett, Erin R, PA-C    Allergies Codeine; Levaquin [levofloxacin]; Penicillins; Zithromax [azithromycin]; and Prednisone  Family History  Problem Relation Age of Onset  . Multiple sclerosis Mother   . Coronary artery disease Father   . Heart disease Brother   . Breast cancer Paternal Aunt     Social History Social History  Substance Use Topics  . Smoking status: Never Smoker  . Smokeless tobacco: Never Used  . Alcohol use No    Review of Systems  Constitutional: No fever/chills Eyes: No visual changes. ENT: nose bleed Cardiovascular: Denies chest pain. Respiratory: Denies shortness of breath. Gastrointestinal: No abdominal pain.  No nausea, no vomiting.  No diarrhea.  No constipation. Genitourinary: Negative for dysuria. Musculoskeletal: Negative for back pain. Skin: Negative for rash. Neurological: Negative for headaches, focal weakness or  numbness.   ____________________________________________   PHYSICAL EXAM:  VITAL SIGNS: ED Triage Vitals [08/13/16 0024]  Enc Vitals Group     BP 135/79     Pulse Rate 86     Resp 18     Temp 97.9 F (36.6 C)     Temp Source Axillary     SpO2 99 %     Weight 218 lb (98.9 kg)     Height 5\' 2"  (1.575 m)     Head Circumference      Peak Flow      Pain Score  Pain Loc      Pain Edu?      Excl. in [place]?     Constitutional: Alert and oriented. Well appearing and in moderate distress. Eyes: Conjunctivae are normal. PERRL. EOMI. Head: Atraumatic. Nose: significant bleeding from left nostril, unable to visualize mucosa Mouth/Throat: Mucous membranes are moist.  Oropharynx non-erythematous. Cardiovascular: Normal rate, regular rhythm. Grossly normal heart sounds.  Good peripheral circulation. Respiratory: Normal respiratory effort.  No retractions. Lungs CTAB. Gastrointestinal: Soft and nontender. No distention. Positive bowel sounds Musculoskeletal: No lower extremity tenderness nor edema.   Neurologic:  Normal speech and language.  Skin:  Skin is warm, dry and intact.  Psychiatric: Mood and affect are normal.   ____________________________________________   LABS (all labs ordered are listed, but only abnormal results are displayed)  Labs Reviewed  CBC - Abnormal; Notable for the following:       Result Value   RBC 3.66 (*)    Hemoglobin 10.4 (*)    HCT 31.7 (*)    RDW 16.9 (*)    All other components within normal limits  PROTIME-INR - Abnormal; Notable for the following:    Prothrombin Time 32.4 (*)    All other components within normal limits   ____________________________________________  EKG  none ____________________________________________  RADIOLOGY  none ____________________________________________   PROCEDURES  Procedure(s) performed: please, see procedure note(s).  .Epistaxis Management Date/Time: 08/13/2016 12:25 AM Performed by:  Loney Hering Authorized by: Loney Hering   Consent:    Consent obtained:  Verbal   Consent given by:  Patient Procedure details:    Treatment site:  Unable to specify   Treatment method:  Merocel sponge and nasal balloon   Treatment episode: initial   Post-procedure details:    Assessment:  Bleeding decreased   Patient tolerance of procedure:  Tolerated well, no immediate complications Comments:     Clamp placed back on nostril after packing to help achieve hemostasis after some time the patients nose continued to bleed so I removed the sponge and packed it with a nasal balloon.      Critical Care performed: No  ____________________________________________   INITIAL IMPRESSION / ASSESSMENT AND PLAN / ED COURSE  Pertinent labs & imaging results that were available during my care of the patient were reviewed by me and considered in my medical decision making (see chart for details).  This is a 61 year old female who comes into the hospital today with some bleeding from her left nostril. The patient is on Coumadin. Her blood pressure here is 135/79. I placed a Merocel sponge and then replaced clamp on the patient's nose as she still had some bleeding. I will check a CBC as well as a PT and determine if the patient needs some reversal of her INR. I will reassess the patient after some time.     After I placed a nasal balloon the bleeding did stop. I did initially order the patient some Percocet for her pain but she did not want the Percocet so she received some Tylenol. The patient will be discharged home to follow-up with ENT for further evaluation of her nosebleed. The patient has no further questions or concerns at this time. She'll be discharged home. ____________________________________________   FINAL CLINICAL IMPRESSION(S) / ED DIAGNOSES  Final diagnoses:  Epistaxis      NEW MEDICATIONS STARTED DURING THIS VISIT:  New Prescriptions   CLINDAMYCIN  (CLEOCIN) 150 MG CAPSULE    Take 1 capsule (150 mg total) by mouth  4 (four) times daily.     Note:  This document was prepared using Dragon voice recognition software and may include unintentional dictation errors.    Loney Hering, MD 08/13/16 [phone]

## 2016-08-14 ENCOUNTER — Ambulatory Visit (INDEPENDENT_AMBULATORY_CARE_PROVIDER_SITE_OTHER): Payer: Medicare Other | Admitting: Cardiology

## 2016-08-14 ENCOUNTER — Ambulatory Visit (INDEPENDENT_AMBULATORY_CARE_PROVIDER_SITE_OTHER): Payer: Medicare Other | Admitting: Internal Medicine

## 2016-08-14 ENCOUNTER — Telehealth: Payer: Self-pay | Admitting: *Deleted

## 2016-08-14 ENCOUNTER — Encounter: Payer: Self-pay | Admitting: Internal Medicine

## 2016-08-14 ENCOUNTER — Other Ambulatory Visit: Payer: Self-pay | Admitting: Internal Medicine

## 2016-08-14 ENCOUNTER — Other Ambulatory Visit: Payer: Self-pay | Admitting: *Deleted

## 2016-08-14 DIAGNOSIS — L03313 Cellulitis of chest wall: Secondary | ICD-10-CM | POA: Diagnosis not present

## 2016-08-14 DIAGNOSIS — I10 Essential (primary) hypertension: Secondary | ICD-10-CM

## 2016-08-14 DIAGNOSIS — Z5181 Encounter for therapeutic drug level monitoring: Secondary | ICD-10-CM

## 2016-08-14 DIAGNOSIS — R262 Difficulty in walking, not elsewhere classified: Secondary | ICD-10-CM | POA: Diagnosis not present

## 2016-08-14 DIAGNOSIS — Z09 Encounter for follow-up examination after completed treatment for conditions other than malignant neoplasm: Secondary | ICD-10-CM

## 2016-08-14 DIAGNOSIS — Z8774 Personal history of (corrected) congenital malformations of heart and circulatory system: Secondary | ICD-10-CM

## 2016-08-14 DIAGNOSIS — Z952 Presence of prosthetic heart valve: Secondary | ICD-10-CM | POA: Diagnosis not present

## 2016-08-14 DIAGNOSIS — M6281 Muscle weakness (generalized): Secondary | ICD-10-CM | POA: Diagnosis not present

## 2016-08-14 DIAGNOSIS — Z9889 Other specified postprocedural states: Secondary | ICD-10-CM

## 2016-08-14 LAB — POCT INR: INR: 5.5

## 2016-08-14 MED ORDER — WARFARIN SODIUM 5 MG PO TABS: ORAL_TABLET | ORAL | 1 refills | Status: DC

## 2016-08-14 MED ORDER — WARFARIN SODIUM 5 MG PO TABS: ORAL_TABLET | ORAL | 0 refills | Status: DC

## 2016-08-14 MED ORDER — DOCUSATE SODIUM 100 MG PO CAPS: 200.0000 mg | ORAL_CAPSULE | Freq: Every day | ORAL | 3 refills | Status: DC

## 2016-08-14 MED ORDER — VITAMIN B-12 1000 MCG PO TABS: 1000.0000 ug | ORAL_TABLET | Freq: Every day | ORAL | 3 refills | Status: AC

## 2016-08-14 NOTE — Telephone Encounter (Signed)
LMTCB with home health nurse.

## 2016-08-14 NOTE — Patient Instructions (Addendum)
You are doing very well!!    You can take 200 mg  Of colace at night to prevent constipation from the iron   You can take a daily B12 supplement by mouth,  And we will check your level in a few weeks  When you return to see me.   Eat a healthy diet !  Don't be afraid of green vegetables

## 2016-08-14 NOTE — Telephone Encounter (Signed)
Spoke with Timmothy Sours and gave him the ok for the verbal orders.

## 2016-08-14 NOTE — Telephone Encounter (Signed)
Pharmacy requests ninety day. 

## 2016-08-14 NOTE — Progress Notes (Signed)
Subjective:  Patient ID: Lauren Bartlett, female    DOB: 1955-05-17  Age: 61 y.o. MRN: [id number]  CC: Diagnoses of S/P minimally invasive mitral valve repair, Encounter for therapeutic drug monitoring, Essential hypertension, and Hospital discharge follow-up were pertinent to this visit.  HPI [name] presents for hospital follow up . Patient was admitted to [facility] on April 19 for elective mitral valve repair and PFO closure .  Coumadin was started POD #1 . Chest tubes and arterial lines removed on POD # 3.     Post operatively she developed rapid atrial fib and  Amiodarone was  started,  reduced and eventually discontinued due to reversion to sinus rhythym with subsequent  development of QT prolongation.  She was discharged to [facility] on April 25 due to general deconditioning.  While she was there her Coumadin dose was increased repeatedly and INR was managed by cardiology.  she followed up with cardiothoracic surgery for post operative visit on May 7 for wound checks  (chest tubes, thoracotomy, right groin) and was noted to have a mild candidal rash under the right breast which was treated with Nystatin.  Dose of Lopressor was reduced to 12.5 mg bid due to hypotension.    She was discharged home on May 14 with Home Health. She developed a nosebleed and ended up in the ER yesterday,  May 16 . Marland KitchenINR was 3.07,  and is 5.5 today.  She required insertion of a small catheter with a balloon  In her left nostril to tamponade the bleeding.  To be removed on Monday .   INRs being done  By home health last dose was yesterday 3.5 mg . Coumadin has now been held until Sunday May 20 when her dose will alternate betrween 5 and 2.5 mg daily     Feels much better ,  Starting to walk .  Has not had shortness of breath since her surgery . She has not had to use any inhalers since home.  Denies pain.   Appetite has been poor since the nosebleed.  Has lost weight, .  Has started walking again  And has lost wiegh  personal best 194.  No results found for: VITAMINB12 Lab Results  Component Value Date   TSH 1.02 03/14/2015          Lab Results  Component Value Date   INR 5.5 08/14/2016   INR 3.07 08/13/2016   INR 1.54 07/23/2016     Outpatient Medications Prior to Visit  Medication Sig Dispense Refill  . acetaminophen (TYLENOL) 500 MG tablet Take 500 mg by mouth 2 (two) times daily as needed for moderate pain or headache.    . albuterol (PROVENTIL HFA;VENTOLIN HFA) 108 (90 Base) MCG/ACT inhaler Inhale 2 puffs into the lungs every 6 (six) hours as needed for wheezing or shortness of breath. Pt uses with SPACER.    [name] albuterol (PROVENTIL) (2.5 MG/3ML) 0.083% nebulizer solution Take 2.5 mg by nebulization every 6 (six) hours as needed for wheezing or shortness of breath.    [name] aspirin EC 81 MG EC tablet Take 1 tablet (81 mg total) by mouth daily.    . benzonatate (TESSALON) 200 MG capsule Take 1 capsule (200 mg total) by mouth 3 (three) times daily as needed for cough. 60 capsule 1  . clindamycin (CLEOCIN) 150 MG capsule Take 1 capsule (150 mg total) by mouth 4 (four) times daily. 20 capsule 0  . famotidine (PEPCID AC) 10 MG chewable  tablet Chew 10 mg by mouth daily as needed for heartburn.    . ferrous sulfate 325 (65 FE) MG tablet Take 325 mg by mouth daily with breakfast.    . fluticasone (FLONASE) 50 MCG/ACT nasal spray Place 2 sprays into both nostrils daily.    . fluticasone (FLOVENT HFA) 110 MCG/ACT inhaler Inhale 1 puff into the lungs 2 (two) times daily.    . furosemide (LASIX) 40 MG tablet Take 1 tablet (40 mg total) by mouth daily. 30 tablet 0  . hydroxypropyl methylcellulose / hypromellose (ISOPTO TEARS / GONIOVISC) 2.5 % ophthalmic solution Place 1 drop into both eyes 2 (two) times daily.    . Hypromellose (ISOPTO TEARS) 0.5 % SOLN Apply 1 drop to eye 2 (two) times daily.    [name] ipratropium (ATROVENT) 0.03 % nasal spray USE 2 SPRAYS NASALLY THREE TIMES DAILY 90 mL 1  .  loratadine-pseudoephedrine (CLARITIN-D 24-HOUR) 10-240 MG 24 hr tablet Take 1 tablet by mouth daily.    [name] LORazepam (ATIVAN) 0.5 MG tablet Take 1 tablet (0.5 mg total) by mouth every 8 (eight) hours as needed for anxiety. 30 tablet 0  . metoprolol tartrate (LOPRESSOR) 25 MG tablet Take 0.5 tablets (12.5 mg total) by mouth 2 (two) times daily.    . montelukast (SINGULAIR) 10 MG tablet Take 1 tablet (10 mg total) by mouth at bedtime. 30 tablet 3  . OXYGEN Inhale 2 L into the lungs at bedtime as needed (at bedtime and as needed).    . polyethylene glycol powder (GLYCOLAX/MIRALAX) powder Take 17 g by mouth 2 (two) times daily as needed. (Patient taking differently: Take 17 g by mouth daily as needed for mild constipation. ) 3350 g 1  . traMADol (ULTRAM) 50 MG tablet Take 1-2 tablets (50-100 mg total) by mouth every 4 (four) hours as needed for moderate pain. 30 tablet 0  . umeclidinium-vilanterol (ANORO ELLIPTA) 62.5-25 MCG/INH AEPB Inhale 1 puff into the lungs daily. 1 each 11  . levothyroxine (SYNTHROID, LEVOTHROID) 112 MCG tablet Take 112 mcg by mouth daily before breakfast.    . vitamin B-12 (CYANOCOBALAMIN) 1000 MCG tablet Take 1,000 mcg by mouth daily.    [name] warfarin (COUMADIN) 5 MG tablet Take 1 tablet (5 mg total) by mouth daily at 6 PM.    . warfarin (COUMADIN) 5 MG tablet Take 1/2 tablet to 1 tablet daily or as directed by Coumadin Clinic 30 tablet 0   No facility-administered medications prior to visit.     Review of Systems;  Patient denies headache, fevers, malaise, unintentional weight loss, skin rash, eye pain, sinus congestion and sinus pain, sore throat, dysphagia,  hemoptysis , cough, dyspnea, wheezing, chest pain, palpitations, orthopnea, edema, abdominal pain, nausea, melena, diarrhea, constipation, flank pain, dysuria, hematuria, urinary  Frequency, nocturia, numbness, tingling, seizures,  Focal weakness, Loss of consciousness,  Tremor, insomnia, depression, anxiety, and suicidal  ideation.      Objective:  BP 106/74 (BP Location: Left Arm, Patient Position: Sitting, Cuff Size: Large)   Pulse 100   Temp 98.2 F (36.8 C) (Oral)   Resp 15   Ht 5\' 2"  (1.575 m)   Wt 215 lb 9.6 oz (97.8 kg)   SpO2 100%   BMI 39.43 kg/m   BP Readings from Last 3 Encounters:  08/15/16 104/60  08/14/16 106/74  08/13/16 118/68    Wt Readings from Last 3 Encounters:  08/15/16 216 lb (98 kg)  08/14/16 215 lb 9.6 oz (97.8 kg)  08/13/16 218 lb (  98.9 kg)    General appearance: alert, cooperative and appears stated age Ears: normal TM's and external ear canals both ears Throat: lips, mucosa, and tongue normal; teeth and gums normal Neck: no adenopathy, no carotid bruit, supple, symmetrical, trachea midline and thyroid not enlarged, symmetric, no tenderness/mass/nodules Back: symmetric, no curvature. ROM normal. No CVA tenderness. Lungs: clear to auscultation bilaterally Heart: regular rate and rhythm, S1, S2 normal, no murmur, click, rub or gallop Abdomen: soft, non-tender; bowel sounds normal; no masses,  no organomegaly Pulses: 2+ and symmetric Skin: Skin color, texture, turgor normal. No rashes or lesions Lymph nodes: Cervical, supraclavicular, and axillary nodes normal.  Lab Results  Component Value Date   HGBA1C 5.1 07/14/2016   HGBA1C 5.4 07/28/2015    Lab Results  Component Value Date   CREATININE 0.91 08/15/2016   CREATININE 0.86 07/21/2016   CREATININE 0.92 07/20/2016    Lab Results  Component Value Date   WBC 8.6 08/13/2016   HGB 10.4 (L) 08/13/2016   HCT 31.7 (L) 08/13/2016   PLT 225 08/13/2016   GLUCOSE 93 08/15/2016   CHOL 173 03/14/2015   TRIG 85.0 03/14/2015   HDL 57.10 03/14/2015   LDLCALC 99 03/14/2015   ALT 14 07/14/2016   AST 17 07/14/2016   NA 144 08/15/2016   K 4.1 08/15/2016   CL 104 08/15/2016   CREATININE 0.91 08/15/2016   BUN 18 08/15/2016   CO2 22 08/15/2016   TSH 1.02 03/14/2015   INR 5.5 08/14/2016   HGBA1C 5.1 07/14/2016      No results found.  Assessment & Plan:   Problem List Items Addressed This Visit    S/P minimally invasive mitral valve repair    She is doing well post operatively and reports improved exercise tolerance and no dyspneic episodes since her surgery .      Hypertension (Chronic)    She has had episodes of symptomatic hypotension on new regimen and lopressor dose was reduced to 12.5 mg bid on May 7 . She feels better now but B P is still on the low side. No changes were made today given her improved clinical status       Hospital discharge follow-up    Patient returned to ER  On May 16, post discharge from [facility] on May 14,  for supratherapeutic INR resulting in epistaxis.  She has follow up for INR management with cardiology  No new issues or questions about discharge plans at the visit today for hospital follow up. All labs , imaging studies and progress notes from admission were reviewed with patient today        Encounter for therapeutic drug monitoring    INR is supratherapeutic since discharge from [facility],  Resulting in uncontrolled epistaxis requiring ER bisit for balloon tamponade of left nostril  Bleed.  No Vitamin K was given,  INR today 5.5  Her coumadin last dose was 3.5 mg on May 16 and all coumadin has been held until Sunday per patient (May 20) when she is to resume a schedule of 5 mg alternating with 2.5 mg          I have changed Ms. Carda's vitamin B-12. I am also having her maintain her loratadine-pseudoephedrine, polyethylene glycol powder, albuterol, fluticasone, fluticasone, OXYGEN, albuterol, LORazepam, umeclidinium-vilanterol, hydroxypropyl methylcellulose / hypromellose, famotidine, acetaminophen, ipratropium, montelukast, benzonatate, Hypromellose, aspirin, furosemide, traMADol, ferrous sulfate, metoprolol tartrate, and clindamycin.  Meds ordered this encounter  Medications  . vitamin B-12 (CYANOCOBALAMIN) 1000 MCG tablet  Sig: Take 1 tablet  (1,000 mcg total) by mouth daily.    Dispense:  90 tablet    Refill:  3  . DISCONTD: docusate sodium (COLACE) 100 MG capsule    Sig: Take 2 capsules (200 mg total) by mouth daily.    Dispense:  60 capsule    Refill:  3    Medications Discontinued During This Encounter  Medication Reason  . vitamin B-12 (CYANOCOBALAMIN) 1000 MCG tablet Reorder    Follow-up: No Follow-up on file.   [name], MD

## 2016-08-14 NOTE — Telephone Encounter (Signed)
Don from [facility] has requested a returned called  Contact [phone]

## 2016-08-15 ENCOUNTER — Telehealth: Payer: Self-pay | Admitting: Internal Medicine

## 2016-08-15 ENCOUNTER — Ambulatory Visit (INDEPENDENT_AMBULATORY_CARE_PROVIDER_SITE_OTHER): Payer: Medicare Other | Admitting: Nurse Practitioner

## 2016-08-15 ENCOUNTER — Encounter: Payer: Self-pay | Admitting: Nurse Practitioner

## 2016-08-15 ENCOUNTER — Telehealth: Payer: Self-pay

## 2016-08-15 VITALS — BP 104/60 | HR 86 | Ht 63.0 in | Wt 216.0 lb

## 2016-08-15 DIAGNOSIS — Z9889 Other specified postprocedural states: Secondary | ICD-10-CM

## 2016-08-15 DIAGNOSIS — I5032 Chronic diastolic (congestive) heart failure: Secondary | ICD-10-CM | POA: Diagnosis not present

## 2016-08-15 DIAGNOSIS — I480 Paroxysmal atrial fibrillation: Secondary | ICD-10-CM

## 2016-08-15 DIAGNOSIS — R040 Epistaxis: Secondary | ICD-10-CM | POA: Diagnosis not present

## 2016-08-15 DIAGNOSIS — L03313 Cellulitis of chest wall: Secondary | ICD-10-CM | POA: Diagnosis not present

## 2016-08-15 DIAGNOSIS — I10 Essential (primary) hypertension: Secondary | ICD-10-CM | POA: Diagnosis not present

## 2016-08-15 DIAGNOSIS — Z952 Presence of prosthetic heart valve: Secondary | ICD-10-CM | POA: Diagnosis not present

## 2016-08-15 DIAGNOSIS — R262 Difficulty in walking, not elsewhere classified: Secondary | ICD-10-CM | POA: Diagnosis not present

## 2016-08-15 DIAGNOSIS — M6281 Muscle weakness (generalized): Secondary | ICD-10-CM | POA: Diagnosis not present

## 2016-08-15 NOTE — Telephone Encounter (Signed)
PA started on cover my meds for docusate sodium.

## 2016-08-15 NOTE — Telephone Encounter (Signed)
Patient is taking 1 Asprin a day 81 mg

## 2016-08-15 NOTE — Telephone Encounter (Signed)
LMTCB. Need to verify if the pt is taking aspirin.

## 2016-08-15 NOTE — Telephone Encounter (Signed)
Yes, she should still take aspirin,  Per her hospital discharge instructions

## 2016-08-15 NOTE — Progress Notes (Signed)
Office Visit    Patient Name: Lauren Bartlett Date of Encounter: 08/15/2016  Primary Care Provider:  [name], MD Primary Cardiologist:  Jerilynn Mages. Fletcher Anon, MD   Chief Complaint    61 year old female with a history of hypertension, COPD, anxiety, depression, and mitral valve prolapse status post recent mitral valve repair and PFO closure, who presents for follow-up.  Past Medical History    Past Medical History:  Diagnosis Date  . Anxiety   . Bleeding from the nose   . Chronic diastolic congestive heart failure ([place])    a. 03/2016 Echo: EF 55-60%, Gr2 DD.  [name] Congenital absence of one kidney   . COPD (chronic obstructive pulmonary disease) ([place])   . Depression    treated at [facility]  . Dyspnea   . Emphysema/COPD ([place])   . GERD (gastroesophageal reflux disease)   . History of sciatica   . Hypertension   . Hypothyroidism   . Mitral Valve Prolapse & Severe mitral regurgitation s/p MVR    a. 03/2016 Echo: severe MVP involving the posterior leaflet, sev MR;  b. 06/2016 minimally invasive MV repair w/ triangular resection of flail segment (P2) of posterior leaflet, gore-tex neochord placement x 4, Sorin Memo 3D rin Annulosplasty (13mm, catalog # [id number], ser # [id number]).  . Motion sickness    cars  . Pneumonia   . PONV (postoperative nausea and vomiting)   . Post-op Afib    a. 06/2016 following MV repair-->amio (dose reduced 2/2 QT prolongation), coumadin.  . Pulmonary hypertension ([place])   . S/P patent foramen ovale closure    a. 06/2016 @ time of MV Repair.  . Sleep apnea    O2 at night and PRN   Past Surgical History:  Procedure Laterality Date  . ABDOMINAL HYSTERECTOMY    . BREAST BIOPSY Right 2011   [facility]< benign  . CATARACT EXTRACTION W/PHACO Left 11/14/2015   Procedure: CATARACT EXTRACTION PHACO AND INTRAOCULAR LENS PLACEMENT (IOC);  Surgeon: Leandrew Koyanagi, MD;  Location: [facility];  Service: Ophthalmology;  Laterality: Left;  sleep apnea [place]  . CATARACT  EXTRACTION W/PHACO Right 12/19/2015   Procedure: CATARACT EXTRACTION PHACO AND INTRAOCULAR LENS PLACEMENT (IOC);  Surgeon: Leandrew Koyanagi, MD;  Location: [facility];  Service: Ophthalmology;  Laterality: Right;  ANXIETY GENEROUS IV SEDATION TORIC LEN  . COMBINED HYSTERECTOMY ABDOMINAL W/ A&P REPAIR / OOPHORECTOMY  1996   benign tumor  . INNER EAR SURGERY     bilateral  . MITRAL VALVE REPAIR Right 07/17/2016   Procedure: MINIMALLY INVASIVE MITRAL VALVE REPAIR (MVR) USING 26 SORIN MEMO 3D ANNULOPLASTY RING;  Surgeon: Rexene Alberts, MD;  Location: [place];  Service: Open Heart Surgery;  Laterality: Right;  . PATENT FORAMEN OVALE(PFO) CLOSURE N/A 07/17/2016   Procedure: PATENT FORAMEN OVALE (PFO) CLOSURE;  Surgeon: Rexene Alberts, MD;  Location: [facility];  Service: Open Heart Surgery;  Laterality: N/A;  . RIGHT/LEFT HEART CATH AND CORONARY ANGIOGRAPHY Bilateral 06/16/2016   Procedure: Right/Left Heart Cath and Coronary Angiography;  Surgeon: Wellington Hampshire, MD;  Location: [facility];  Service: Cardiovascular;  Laterality: Bilateral;  . TEE WITHOUT CARDIOVERSION N/A 05/19/2016   Procedure: TRANSESOPHAGEAL ECHOCARDIOGRAM (TEE);  Surgeon: Wellington Hampshire, MD;  Location: [facility];  Service: Cardiovascular;  Laterality: N/A;  . TEE WITHOUT CARDIOVERSION N/A 06/09/2016   Procedure: TRANSESOPHAGEAL ECHOCARDIOGRAM (TEE);  Surgeon: Wellington Hampshire, MD;  Location: [facility];  Service: Cardiovascular;  Laterality: N/A;  . TEE  WITHOUT CARDIOVERSION N/A 06/16/2016   Procedure: TRANSESOPHAGEAL ECHOCARDIOGRAM (TEE);  Surgeon: Wellington Hampshire, MD;  Location: [facility];  Service: Cardiovascular;  Laterality: N/A;  . TEE WITHOUT CARDIOVERSION N/A 07/17/2016   Procedure: TRANSESOPHAGEAL ECHOCARDIOGRAM (TEE);  Surgeon: Rexene Alberts, MD;  Location: [facility];  Service: Open Heart Surgery;  Laterality: N/A;  . TONSILLECTOMY      Allergies  Allergies  Allergen Reactions  . Codeine Hives and Nausea  And Vomiting  . Levaquin [Levofloxacin] Nausea And Vomiting  . Penicillins Hives, Itching and Other (See Comments)    Has patient had a PCN reaction causing immediate rash, facial/tongue/throat swelling, SOB or lightheadedness with hypotension: No Has patient had a PCN reaction causing severe rash involving mucus membranes or skin necrosis: No Has patient had a PCN reaction that required hospitalization No Has patient had a PCN reaction occurring within the last 10 years: No If all of the above answers are "NO", then may proceed with Cephalosporin use.  [name] Zithromax [Azithromycin] Other (See Comments)    Reaction:  Hallucinations   . Prednisone Palpitations and Other (See Comments)    Reaction:  Hallucinations  Can take injections but not oral meds    History of Present Illness    61 year old female with the above complex past medical history including COPD, hypertension, anxiety, depression, and mitral valve prolapse with severe mitral regurgitation. She underwent extensive evaluation for dyspnea earlier this year with subsequent finding of mitral valve prolapse, severe mitral regurgitation, and patent foramen ovale. This was followed by cardiac catheterization revealing no significant coronary artery disease. She was then referred to thoracic surgery and subsequently underwent successful mitral valve repair and PFO closure. Postoperative course was complicated by deconditioning and postoperative atrial fibrillation requiring initiation of amiodarone therapy. Amiodarone dose was reduced secondary to QT prolongation. She was subsequently discharged to rehabilitation and later discharged home. She was seen back in surgical clinic with complaints of fatigue and low blood pressures. Metoprolol dose was cut in half to 12.5 mg twice a day. She has been on Coumadin postoperatively and in that setting, developed a nosebleed on May 16. INR was 3.3.  Nasal balloon applied with resolution of bleeding.  She  will see ENT on Monday.  She has not had any further bleeding.  Overall, she feels significantly better since prior to her surgery. She has not been having any dyspnea on exertion. She denies chest pain, palpitations, PND, orthopnea, dizziness, syncope, edema, or early satiety.  Surgical sites continue to heal well.  Home Medications    Prior to Admission medications   Medication Sig Start Date End Date Taking? Authorizing Provider  acetaminophen (TYLENOL) 500 MG tablet Take 500 mg by mouth 2 (two) times daily as needed for moderate pain or headache.   Yes [provider]  albuterol (PROVENTIL HFA;VENTOLIN HFA) 108 (90 Base) MCG/ACT inhaler Inhale 2 puffs into the lungs every 6 (six) hours as needed for wheezing or shortness of breath. Pt uses with SPACER.   Yes [provider]  albuterol (PROVENTIL) (2.5 MG/3ML) 0.083% nebulizer solution Take 2.5 mg by nebulization every 6 (six) hours as needed for wheezing or shortness of breath.   Yes [provider]  aspirin EC 81 MG EC tablet Take 1 tablet (81 mg total) by mouth daily. 07/23/16  Yes Barrett, Erin R, PA-C  benzonatate (TESSALON) 200 MG capsule Take 1 capsule (200 mg total) by mouth 3 (three) times daily as needed for cough. 07/04/16  Yes Deborra Medina  L, MD  clindamycin (CLEOCIN) 150 MG capsule Take 1 capsule (150 mg total) by mouth 4 (four) times daily. 08/13/16 08/18/16 Yes Loney Hering, MD  docusate sodium (COLACE) 100 MG capsule TAKE 2 CAPSULES BY MOUTH EVERY DAY 08/15/16  Yes [name], MD  famotidine (PEPCID AC) 10 MG chewable tablet Chew 10 mg by mouth daily as needed for heartburn.   Yes [provider]  ferrous sulfate 325 (65 FE) MG tablet Take 325 mg by mouth daily with breakfast.   Yes [provider]  fluticasone (FLONASE) 50 MCG/ACT nasal spray Place 2 sprays into both nostrils daily.   Yes [provider]  fluticasone (FLOVENT HFA) 110 MCG/ACT inhaler Inhale 1 puff into  the lungs 2 (two) times daily.   Yes [provider]  furosemide (LASIX) 40 MG tablet Take 1 tablet (40 mg total) by mouth daily. 07/23/16  Yes Barrett, Erin R, PA-C  hydroxypropyl methylcellulose / hypromellose (ISOPTO TEARS / GONIOVISC) 2.5 % ophthalmic solution Place 1 drop into both eyes 2 (two) times daily.   Yes [provider]  Hypromellose (ISOPTO TEARS) 0.5 % SOLN Apply 1 drop to eye 2 (two) times daily.   Yes [provider]  ipratropium (ATROVENT) 0.03 % nasal spray USE 2 SPRAYS NASALLY THREE TIMES DAILY 06/23/16  Yes Laverle Hobby, MD  levothyroxine (SYNTHROID, LEVOTHROID) 125 MCG tablet Take 125 mcg by mouth daily before breakfast.   Yes [provider]  loratadine-pseudoephedrine (CLARITIN-D 24-HOUR) 10-240 MG 24 hr tablet Take 1 tablet by mouth daily.   Yes [provider]  LORazepam (ATIVAN) 0.5 MG tablet Take 1 tablet (0.5 mg total) by mouth every 8 (eight) hours as needed for anxiety. 04/18/16 04/18/17 Yes Earleen Newport, MD  metoprolol tartrate (LOPRESSOR) 25 MG tablet Take 0.5 tablets (12.5 mg total) by mouth 2 (two) times daily. 08/04/16  Yes Barrett, Erin R, PA-C  montelukast (SINGULAIR) 10 MG tablet Take 1 tablet (10 mg total) by mouth at bedtime. 06/30/16  Yes [name], MD  OXYGEN Inhale 2 L into the lungs at bedtime as needed (at bedtime and as needed).   Yes [provider]  polyethylene glycol powder (GLYCOLAX/MIRALAX) powder Take 17 g by mouth 2 (two) times daily as needed. Patient taking differently: Take 17 g by mouth daily as needed for mild constipation.  08/03/15  Yes [name], MD  traMADol (ULTRAM) 50 MG tablet Take 1-2 tablets (50-100 mg total) by mouth every 4 (four) hours as needed for moderate pain. 07/23/16  Yes Barrett, Erin R, PA-C  umeclidinium-vilanterol (ANORO ELLIPTA) 62.5-25 MCG/INH AEPB Inhale 1 puff into the lungs daily. 04/28/16  Yes [name], MD  vitamin B-12 (CYANOCOBALAMIN)  1000 MCG tablet Take 1 tablet (1,000 mcg total) by mouth daily. 08/14/16  Yes [name], MD  warfarin (COUMADIN) 5 MG tablet Take 1/2 to 1 tablet daily as directed by Coumadin clinic 08/14/16  Yes Wellington Hampshire, MD    Review of Systems    Recent nose bleed.  No further dizziness/lightheadedness.  She denies chest pain, palpitations, dyspnea, pnd, orthopnea, n, v, syncope, edema, weight gain, or early satiety. .  All other systems reviewed and are otherwise negative except as noted above.  Physical Exam    VS:  BP 104/60 (BP Location: Left Arm, Patient Position: Sitting, Cuff Size: Large)   Pulse 86   Ht 5\' 3"  (1.6 m)   Wt 216 lb (98 kg)  BMI 38.26 kg/m  , BMI Body mass index is 38.26 kg/m. GEN: Well nourished, well developed, in no acute distress.  HEENT: normal.  Neck: Supple, no JVD, carotid bruits, or masses. Cardiac: RRR, no murmurs, rubs, or gallops. No clubbing, cyanosis, edema.  Radials/DP/PT 2+ and equal bilaterally.  Respiratory:  Respirations regular and unlabored, clear to auscultation bilaterally. GI: Soft, nontender, nondistended, BS + x 4. MS: no deformity or atrophy. Skin: warm and dry, no rash. Neuro:  Strength and sensation are intact. Psych: Normal affect.  Accessory Clinical Findings    ECG - RSR, 86, no acute st/t changes. QTc 493 (stable).  Assessment & Plan    1.  Mitral valve prolapse, severe mitral regurgitation, status post mitral valve repair: Patient is now status post mitral valve repair in April and has been doing well without chest pain or dyspnea. She is not having any significant edema. Surgical sites healing well. I will arrange for a follow-up 2-D echocardiogram prior to her next office visit with [name] in June. She has follow-up with thoracic surgery in July. She remains on Coumadin.  2. Postoperative atrial fibrillation: Patient is maintaining sinus rhythm on low-dose amiodarone. QTc is stable today. She has not had any  palpitations.  3. Epistaxis: Patient was recently evaluated in emergency department secondary to nosebleed. She is status post placement of a nasal balloon and has follow-up with ENT on Monday. Of note, INR was 3.3. INR was 5.5 yesterday. Dose is being adjusted by Coumadin clinic.  4. Essential hypertension: Blood pressure is actually been soft recently. As result, metoprolol was reduced to 12.5 mg twice a day. She is tolerating this much better.  5. Chronic diastolic congestive heart failure: Volume was good today. Heart rate and blood pressure stable. She is now on Lasix. Follow-up basic metabolic panel today.   6. Disposition: Follow-up echocardiogram prior to next office visit in June to establish new baseline following mitral valve repair. I will arrange for a basic metabolic panel today as she is now on Lasix, which she was not on preoperatively.   Murray Hodgkins, NP 08/15/2016, 3:54 PM

## 2016-08-15 NOTE — Patient Instructions (Addendum)
Testing/Procedures: Your physician has requested that you have an echocardiogram before upcoming appointment with [name]. Echocardiography is a painless test that uses sound waves to create images of your heart. It provides your doctor with information about the size and shape of your heart and how well your heart's chambers and valves are working. This procedure takes approximately one hour. There are no restrictions for this procedure.    Follow-Up: Please keep your upcoming follow up appointment with [name].   It was a pleasure seeing you today here in the office. Please do not hesitate to give [place] a call back if you have any further questions. [place], BSN    Echocardiogram An echocardiogram, or echocardiography, uses sound waves (ultrasound) to produce an image of your heart. The echocardiogram is simple, painless, obtained within a short period of time, and offers valuable information to your health care provider. The images from an echocardiogram can provide information such as:  Evidence of coronary artery disease (CAD).  Heart size.  Heart muscle function.  Heart valve function.  Aneurysm detection.  Evidence of a past heart attack.  Fluid buildup around the heart.  Heart muscle thickening.  Assess heart valve function. Tell a health care provider about:  Any allergies you have.  All medicines you are taking, including vitamins, herbs, eye drops, creams, and over-the-counter medicines.  Any problems you or family members have had with anesthetic medicines.  Any blood disorders you have.  Any surgeries you have had.  Any medical conditions you have.  Whether you are pregnant or may be pregnant. What happens before the procedure? No special preparation is needed. Eat and drink normally. What happens during the procedure?  In order to produce an image of your heart, gel will be applied to your chest and a wand-like tool (transducer)  will be moved over your chest. The gel will help transmit the sound waves from the transducer. The sound waves will harmlessly bounce off your heart to allow the heart images to be captured in real-time motion. These images will then be recorded.  You may need an IV to receive a medicine that improves the quality of the pictures. What happens after the procedure? You may return to your normal schedule including diet, activities, and medicines, unless your health care provider tells you otherwise. This information is not intended to replace advice given to you by your health care provider. Make sure you discuss any questions you have with your health care provider. Document Released: 03/14/2000 Document Revised: 11/03/2015 Document Reviewed: 11/22/2012 Elsevier Interactive Patient Education  2017 [employer].

## 2016-08-15 NOTE — Telephone Encounter (Signed)
Pt is taking warafin does she still need to be taking aspirin as well.

## 2016-08-16 LAB — BASIC METABOLIC PANEL
BUN / CREAT RATIO: 20 (ref 12–28)
BUN: 18 mg/dL (ref 8–27)
CO2: 22 mmol/L (ref 18–29)
CREATININE: 0.91 mg/dL (ref 0.57–1.00)
Calcium: 7.4 mg/dL — ABNORMAL LOW (ref 8.7–10.3)
Chloride: 104 mmol/L (ref 96–106)
GFR calc Af Amer: 79 mL/min/{1.73_m2} (ref >59)
GFR calc non Af Amer: 69 mL/min/{1.73_m2} (ref >59)
Glucose: 93 mg/dL (ref 65–99)
Potassium: 4.1 mmol/L (ref 3.5–5.2)
Sodium: 144 mmol/L (ref 134–144)

## 2016-08-16 NOTE — Assessment & Plan Note (Addendum)
INR is supratherapeutic since discharge from [facility],  Resulting in uncontrolled epistaxis requiring ER bisit for balloon tamponade of left nostril  Bleed.  No Vitamin K was given,  INR today 5.5  Her coumadin last dose was 3.5 mg on May 16 and all coumadin has been held until Sunday per patient (May 20) when she is to resume a schedule of 5 mg alternating with 2.5 mg

## 2016-08-16 NOTE — Assessment & Plan Note (Signed)
She is doing well post operatively and reports improved exercise tolerance and no dyspneic episodes since her surgery .

## 2016-08-16 NOTE — Assessment & Plan Note (Signed)
She has had episodes of symptomatic hypotension on new regimen and lopressor dose was reduced to 12.5 mg bid on May 7 . She feels better now but B P is still on the low side. No changes were made today given her improved clinical status

## 2016-08-16 NOTE — Assessment & Plan Note (Signed)
Patient returned to ER  On May 16, post discharge from [facility] on May 14,  for supratherapeutic INR resulting in epistaxis.  She has follow up for INR management with cardiology  No new issues or questions about discharge plans at the visit today for hospital follow up. All labs , imaging studies and progress notes from admission were reviewed with patient today

## 2016-08-18 ENCOUNTER — Telehealth: Payer: Self-pay | Admitting: *Deleted

## 2016-08-18 ENCOUNTER — Other Ambulatory Visit: Payer: Self-pay

## 2016-08-18 ENCOUNTER — Ambulatory Visit: Payer: Self-pay | Admitting: *Deleted

## 2016-08-18 ENCOUNTER — Ambulatory Visit (INDEPENDENT_AMBULATORY_CARE_PROVIDER_SITE_OTHER): Payer: Medicare Other | Admitting: Internal Medicine

## 2016-08-18 ENCOUNTER — Other Ambulatory Visit: Payer: Self-pay | Admitting: *Deleted

## 2016-08-18 DIAGNOSIS — I10 Essential (primary) hypertension: Secondary | ICD-10-CM | POA: Diagnosis not present

## 2016-08-18 DIAGNOSIS — Z5181 Encounter for therapeutic drug level monitoring: Secondary | ICD-10-CM

## 2016-08-18 DIAGNOSIS — Z9889 Other specified postprocedural states: Secondary | ICD-10-CM

## 2016-08-18 DIAGNOSIS — Z8774 Personal history of (corrected) congenital malformations of heart and circulatory system: Secondary | ICD-10-CM

## 2016-08-18 DIAGNOSIS — R262 Difficulty in walking, not elsewhere classified: Secondary | ICD-10-CM | POA: Diagnosis not present

## 2016-08-18 DIAGNOSIS — L03313 Cellulitis of chest wall: Secondary | ICD-10-CM | POA: Diagnosis not present

## 2016-08-18 DIAGNOSIS — Z952 Presence of prosthetic heart valve: Secondary | ICD-10-CM | POA: Diagnosis not present

## 2016-08-18 DIAGNOSIS — M6281 Muscle weakness (generalized): Secondary | ICD-10-CM | POA: Diagnosis not present

## 2016-08-18 DIAGNOSIS — R040 Epistaxis: Secondary | ICD-10-CM | POA: Diagnosis not present

## 2016-08-18 LAB — POCT INR: INR: 1.5

## 2016-08-18 MED ORDER — FUROSEMIDE 40 MG PO TABS
40.0000 mg | ORAL_TABLET | Freq: Every day | ORAL | 5 refills | Status: DC
Start: 2016-08-18 — End: 2016-08-26

## 2016-08-18 MED ORDER — FLUTICASONE PROPIONATE 50 MCG/ACT NA SUSP: 2.0000 | Freq: Every day | NASAL | 2 refills | Status: DC

## 2016-08-18 MED ORDER — FLUTICASONE PROPIONATE HFA 110 MCG/ACT IN AERO: 1.0000 | INHALATION_SPRAY | Freq: Two times a day (BID) | RESPIRATORY_TRACT | 5 refills | Status: DC

## 2016-08-18 MED ORDER — METOPROLOL TARTRATE 25 MG PO TABS: 12.5000 mg | ORAL_TABLET | Freq: Two times a day (BID) | ORAL | 5 refills | Status: DC

## 2016-08-18 MED ORDER — LEVOTHYROXINE SODIUM 125 MCG PO TABS: 125.0000 ug | ORAL_TABLET | Freq: Every day | ORAL | 5 refills | Status: DC

## 2016-08-18 NOTE — Telephone Encounter (Signed)
Spoke with pt and informed her that she should still be taking the aspirin. Pt gave a verbal understanding.

## 2016-08-18 NOTE — Telephone Encounter (Signed)
Pt called back returning your call. Please advise, thank you!  Call pt @ [phone]

## 2016-08-18 NOTE — Telephone Encounter (Signed)
Irineo Axon the verbal okay for orders.

## 2016-08-18 NOTE — Telephone Encounter (Signed)
LMTCB. Need to ask pt how often she is actually taking the tramadol and the lorazepam.

## 2016-08-18 NOTE — Telephone Encounter (Signed)
Spoke with pt to let her know that her stool softener is not covered by her insurance because it is an OTC medication. Pt stated that she has already gotten the medication. While on the phone with the pt she stated that she needed 7 medications refilled. When looking at the pt's chart none of the medications have ever been filled by you. The pt stated that she does not see any of those providers anymore and wants to know if you can refill them for her.  The medications are:   Lopressor  Levothyroxine  Tramadol  Lorazepam   Lasix    Flovent inhaler

## 2016-08-18 NOTE — Telephone Encounter (Signed)
Yes, you can refill the lpressor, levothyroxine lasix and the flovent .  The tramadol and lorazepam are controlled substances.  I need to know how often Lauren Bartlett is actually taking them,.  Then you can queue them up  for refills

## 2016-08-18 NOTE — Telephone Encounter (Signed)
[facility] health has requested verbal orders for physical therapy with frequency of 1 time a week for 1 week, 2 times a week for 1 week and 1 time a week for 2 weeks. Contact [phone]

## 2016-08-18 NOTE — Patient Outreach (Signed)
Received a call from pt requesting to cancel today's home visit, HIPAA/identity verified.   Pt reports she has a MD appointment today, need to reschedule home visit.  Pt reports she had a nose bleed that started 5/15 (view in [place] ED visit), nose was impacted and to go today and have it removed.  Pt reports she has not had any more bleeding since being impacted.    RN CM discussed with pt the  HF scale tele monitoring program through [facility]- a great benefit of her insurance and if interested in having it set up.  Pt reports she is interested in the program.   Plan:  As discussed with pt, plan to inform [facility] care management assistant to set it  up for pt to receive HF scale tele monitoring program.            As discussed with pt, initial home visit rescheduled for later this week.    Zara Chess.   [place] Care Management  [phone]

## 2016-08-19 DIAGNOSIS — R262 Difficulty in walking, not elsewhere classified: Secondary | ICD-10-CM | POA: Diagnosis not present

## 2016-08-19 DIAGNOSIS — Z952 Presence of prosthetic heart valve: Secondary | ICD-10-CM | POA: Diagnosis not present

## 2016-08-19 DIAGNOSIS — I10 Essential (primary) hypertension: Secondary | ICD-10-CM | POA: Diagnosis not present

## 2016-08-19 DIAGNOSIS — L03313 Cellulitis of chest wall: Secondary | ICD-10-CM | POA: Diagnosis not present

## 2016-08-19 DIAGNOSIS — M6281 Muscle weakness (generalized): Secondary | ICD-10-CM | POA: Diagnosis not present

## 2016-08-19 NOTE — Telephone Encounter (Signed)
LMTCB

## 2016-08-19 NOTE — Telephone Encounter (Signed)
Pt called back returning your call. Please advise, thank you! °

## 2016-08-20 NOTE — Telephone Encounter (Signed)
Spoke with pt and she stated that she only takes both the lorazepam and the tramadol PRN, which she stated was very rarely.

## 2016-08-21 MED ORDER — TRAMADOL HCL 50 MG PO TABS: 50.0000 mg | ORAL_TABLET | ORAL | 0 refills | Status: AC | PRN

## 2016-08-21 MED ORDER — LORAZEPAM 0.5 MG PO TABS: 0.5000 mg | ORAL_TABLET | Freq: Three times a day (TID) | ORAL | 0 refills | Status: AC | PRN

## 2016-08-22 ENCOUNTER — Encounter: Payer: Self-pay | Admitting: *Deleted

## 2016-08-22 ENCOUNTER — Other Ambulatory Visit: Payer: Self-pay | Admitting: *Deleted

## 2016-08-22 ENCOUNTER — Telehealth: Payer: Self-pay | Admitting: Cardiovascular Disease

## 2016-08-22 DIAGNOSIS — R262 Difficulty in walking, not elsewhere classified: Secondary | ICD-10-CM | POA: Diagnosis not present

## 2016-08-22 DIAGNOSIS — L03313 Cellulitis of chest wall: Secondary | ICD-10-CM | POA: Diagnosis not present

## 2016-08-22 DIAGNOSIS — Z952 Presence of prosthetic heart valve: Secondary | ICD-10-CM | POA: Diagnosis not present

## 2016-08-22 DIAGNOSIS — I10 Essential (primary) hypertension: Secondary | ICD-10-CM | POA: Diagnosis not present

## 2016-08-22 DIAGNOSIS — M6281 Muscle weakness (generalized): Secondary | ICD-10-CM | POA: Diagnosis not present

## 2016-08-22 NOTE — Telephone Encounter (Signed)
°*  STAT* If patient is at the pharmacy, call can be transferred to refill team.   1. Which medications need to be refilled? (please list name of each medication and dose if known)  Furosamide and Lopressor   2. Which pharmacy/location (including street and city if local pharmacy) is medication to be sent to? [facility]   3. Do they need a 30 day or 90 day supply?  90 day

## 2016-08-22 NOTE — Patient Outreach (Signed)
Lauren Bartlett) Care Management   08/22/2016  Lauren Bartlett 1955-04-24 [id number]  Lauren Bartlett is an 61 y.o. female  Subjective:  Pt reports saw ENT  MD 5/21 to have nose packing removed, had a slow bleed yesterday, little today, Taking Afrin- working.  Pt reports saw Primary Care MD last week, to follow up again 6/20.  Pt reports no c/o Sob,chest pain, use Oxygen only at night.  Pt reports weighing daily, weight today 217.2 lbs.    Objective:   Vitals:   08/22/16 1614 08/22/16 1629  BP: 128/84   Pulse: 88   Resp:  20   s ROS  Physical Exam  Constitutional: She is oriented to person, place, and time. She appears well-developed and well-nourished.  Cardiovascular: Normal rate, regular rhythm and normal heart sounds.   Respiratory: Effort normal and breath sounds normal.  GI: Soft. Bowel sounds are normal.  Musculoskeletal: Normal range of motion. She exhibits no edema.  Neurological: She is alert and oriented to person, place, and time.  Skin: Skin is warm and dry.  Psychiatric: She has a normal mood and affect. Her behavior is normal. Judgment and thought content normal.    Encounter Medications:   Outpatient Encounter Prescriptions as of 08/22/2016  Medication Sig  . acetaminophen (TYLENOL) 500 MG tablet Take 500 mg by mouth 2 (two) times daily as needed for moderate pain or headache.  . albuterol (PROVENTIL HFA;VENTOLIN HFA) 108 (90 Base) MCG/ACT inhaler Inhale 2 puffs into the lungs every 6 (six) hours as needed for wheezing or shortness of breath. Pt uses with SPACER.  [name] albuterol (PROVENTIL) (2.5 MG/3ML) 0.083% nebulizer solution Take 2.5 mg by nebulization every 6 (six) hours as needed for wheezing or shortness of breath.  [name] aspirin EC 81 MG EC tablet Take 1 tablet (81 mg total) by mouth daily.  . benzonatate (TESSALON) 200 MG capsule Take 1 capsule (200 mg total) by mouth 3 (three) times daily as needed for cough.  . docusate sodium (COLACE) 100 MG capsule  TAKE 2 CAPSULES BY MOUTH EVERY DAY  . famotidine (PEPCID AC) 10 MG chewable tablet Chew 10 mg by mouth daily as needed for heartburn.  . ferrous sulfate 325 (65 FE) MG tablet Take 325 mg by mouth daily with breakfast.  . fluticasone (FLONASE) 50 MCG/ACT nasal spray Place 2 sprays into both nostrils daily.  . fluticasone (FLOVENT HFA) 110 MCG/ACT inhaler Inhale 1 puff into the lungs 2 (two) times daily.  . furosemide (LASIX) 40 MG tablet Take 1 tablet (40 mg total) by mouth daily.  . hydroxypropyl methylcellulose / hypromellose (ISOPTO TEARS / GONIOVISC) 2.5 % ophthalmic solution Place 1 drop into both eyes 2 (two) times daily.  . Hypromellose (ISOPTO TEARS) 0.5 % SOLN Apply 1 drop to eye 2 (two) times daily.  [name] ipratropium (ATROVENT) 0.03 % nasal spray USE 2 SPRAYS NASALLY THREE TIMES DAILY  . levothyroxine (SYNTHROID, LEVOTHROID) 125 MCG tablet Take 1 tablet (125 mcg total) by mouth daily before breakfast.  . loratadine-pseudoephedrine (CLARITIN-D 24-HOUR) 10-240 MG 24 hr tablet Take 1 tablet by mouth daily.  [name] LORazepam (ATIVAN) 0.5 MG tablet Take 1 tablet (0.5 mg total) by mouth every 8 (eight) hours as needed for anxiety.  . metoprolol tartrate (LOPRESSOR) 25 MG tablet Take 0.5 tablets (12.5 mg total) by mouth 2 (two) times daily.  . montelukast (SINGULAIR) 10 MG tablet Take 1 tablet (10 mg total) by mouth at bedtime.  . OXYGEN Inhale 2 L  into the lungs at bedtime as needed (at bedtime and as needed).  . polyethylene glycol powder (GLYCOLAX/MIRALAX) powder Take 17 g by mouth 2 (two) times daily as needed. (Patient taking differently: Take 17 g by mouth daily as needed for mild constipation. )  . traMADol (ULTRAM) 50 MG tablet Take 1-2 tablets (50-100 mg total) by mouth every 4 (four) hours as needed for moderate pain.  [name] umeclidinium-vilanterol (ANORO ELLIPTA) 62.5-25 MCG/INH AEPB Inhale 1 puff into the lungs daily.  . vitamin B-12 (CYANOCOBALAMIN) 1000 MCG tablet Take 1 tablet (1,000 mcg  total) by mouth daily.  [name] warfarin (COUMADIN) 5 MG tablet Take 1/2 to 1 tablet daily as directed by Coumadin clinic   No facility-administered encounter medications on file as of 08/22/2016.     Functional Status:   In your present state of health, do you have any difficulty performing the following activities: 08/22/2016 07/20/2016  Hearing? Y N  Vision? N N  Difficulty concentrating or making decisions? N N  Walking or climbing stairs? N Y  Dressing or bathing? Y Y  Doing errands, shopping? N N  Preparing Food and eating ? N -  Using the Toilet? N -  In the past six months, have you accidently leaked urine? N -  Do you have problems with loss of bowel control? N -  Managing your Medications? N -  Managing your Finances? N -  Housekeeping or managing your Housekeeping? N -  Some recent data might be hidden    Fall/Depression Screening:    Fall Risk  08/22/2016 02/11/2016 03/13/2014  Falls in the past year? No No No   PHQ 2/9 Scores 08/22/2016 02/11/2016 03/13/2014  PHQ - 2 Score 0 0 2  PHQ- 9 Score - - 3    Assessment:  Pleasant 61 year old woman, lives alone in independent living apartment, drives.  Father       Close by, assists if needed.        Hx of COPD:  Lungs clear, O 2 sat at rest- 97%. Uses oxygen only at night.         HF:  No edema,  Recent weights - range 216 lbs to 218.8 lbs, today 217.2 lbs.  Decreasing since               First came home.    Plan:  Plan to continue to follow pt for transition of care, as discussed with pt plan to follow up again            Next week telephonically.            Plan to send Dr. Derrel Nip by in basket 5/25 home visit encounter.   THN CM Care Plan Problem One     Most Recent Value  Care Plan Problem One  Risk for readmission related to recent SNF and hospital discharge -CHF. COPD,recent CABG   Role Documenting the Problem One  Care Management Coordinator  Care Plan for Problem One  Active  THN Long Term Goal   Pt would not readmit  to SNF or hospital in the next 31 days   THN Long Term Goal Start Date  08/12/16  Interventions for Problem One Long Term Goal  initial home visit done, reviewed with pt HF zones/when to call MD   [facility] CM Short Term Goal #1   Pt would keep all MD appointments in the next 30 days   THN CM Short Term Goal #1 Start Date  08/12/16  Interventions for Short Term Goal #1  Discussed with pt recent MD visits- Heart NP, [place] MD   [facility] CM Short Term Goal #2   Pt would not have a weight gain in the next 30 days   THN CM Short Term Goal #2 Start Date  08/12/16  Interventions for Short Term Goal #2  Reviewed with pt recent weights.       Zara Chess.   [place] Care Management  [phone]

## 2016-08-22 NOTE — Telephone Encounter (Signed)
Printed, signed and faxed.  

## 2016-08-22 NOTE — Telephone Encounter (Signed)
Refills were sent in by PCP

## 2016-08-26 ENCOUNTER — Telehealth: Payer: Self-pay | Admitting: Internal Medicine

## 2016-08-26 ENCOUNTER — Ambulatory Visit (INDEPENDENT_AMBULATORY_CARE_PROVIDER_SITE_OTHER): Payer: Medicare Other | Admitting: Interventional Cardiology

## 2016-08-26 DIAGNOSIS — M6281 Muscle weakness (generalized): Secondary | ICD-10-CM | POA: Diagnosis not present

## 2016-08-26 DIAGNOSIS — L03313 Cellulitis of chest wall: Secondary | ICD-10-CM | POA: Diagnosis not present

## 2016-08-26 DIAGNOSIS — Z8774 Personal history of (corrected) congenital malformations of heart and circulatory system: Secondary | ICD-10-CM

## 2016-08-26 DIAGNOSIS — Z5181 Encounter for therapeutic drug level monitoring: Secondary | ICD-10-CM

## 2016-08-26 DIAGNOSIS — R040 Epistaxis: Secondary | ICD-10-CM | POA: Diagnosis not present

## 2016-08-26 DIAGNOSIS — Z9889 Other specified postprocedural states: Secondary | ICD-10-CM

## 2016-08-26 DIAGNOSIS — Z952 Presence of prosthetic heart valve: Secondary | ICD-10-CM | POA: Diagnosis not present

## 2016-08-26 DIAGNOSIS — I10 Essential (primary) hypertension: Secondary | ICD-10-CM | POA: Diagnosis not present

## 2016-08-26 DIAGNOSIS — R262 Difficulty in walking, not elsewhere classified: Secondary | ICD-10-CM | POA: Diagnosis not present

## 2016-08-26 DIAGNOSIS — T45515A Adverse effect of anticoagulants, initial encounter: Secondary | ICD-10-CM | POA: Diagnosis not present

## 2016-08-26 LAB — POCT INR: INR: 1.4

## 2016-08-26 MED ORDER — FUROSEMIDE 40 MG PO TABS: 40.0000 mg | ORAL_TABLET | Freq: Every day | ORAL | 2 refills | Status: DC

## 2016-08-26 MED ORDER — METOPROLOL TARTRATE 25 MG PO TABS: 12.5000 mg | ORAL_TABLET | Freq: Two times a day (BID) | ORAL | 2 refills | Status: DC

## 2016-08-26 NOTE — Telephone Encounter (Signed)
Pharmacy called requesting refills for furosemide (LASIX) 40 MG tablet and metoprolol tartrate (LOPRESSOR) 25 MG tablet. Please advise, thank you!    Pharmacy - [place], [place] [address]

## 2016-08-26 NOTE — Telephone Encounter (Signed)
Refills sent

## 2016-08-27 ENCOUNTER — Telehealth: Payer: Self-pay | Admitting: Cardiovascular Disease

## 2016-08-27 ENCOUNTER — Other Ambulatory Visit: Payer: Self-pay | Admitting: *Deleted

## 2016-08-27 ENCOUNTER — Telehealth: Payer: Self-pay

## 2016-08-27 NOTE — Telephone Encounter (Signed)
-----   Message from Wellington Hampshire, MD sent at 08/27/2016  8:23 AM EDT ----- The patient is having recurrent nose bleed. I discussed with [name] and we decided to stop Warfarin. Please inform the patient and ask her to continue daily Aspirin.    ----- Message ----- From: Rexene Alberts, MD Sent: 08/26/2016   2:49 PM To: Wellington Hampshire, MD  Yes.  Her valve was repaired and to my knowledge she has remained in sinus rhythm since hospital discharge.  She's out far enough now that risk of stroke should be low if she's maintaining NSR. She did have some post-op AF in the hospital and was started on amiodarone.    ----- Message ----- From: Wellington Hampshire, MD Sent: 08/26/2016   2:18 PM To: Rexene Alberts, MD  I received a phone call from Dr. Tami Ribas,  ENT regarding our mutual patient. She has been having recurrent nosebleed in spite of cauterizing and packing. He was not sure if there was anything else possible for him to do. Are you ok If we stop her anticoagulation? Thanks.   MA

## 2016-08-27 NOTE — Patient Outreach (Signed)
Transition of care call successful, ongoing follow up on  SNF discharge 5/14, admitted after recent hospitalization 4/19-4/25 for mitral valve repair, chronic diastolic CHF, COPD exacerbation.   Spoke with pt, HIPAA verified.  Pt reports doing good, waiting on 2 medications (fluid and BP) to get filled by mail order, they are working on it, still have some but running low.  Pt reports weight today 216.2 lbs, staying there, no edema, HH PT still coming.  Pt reports has a lot of MD visits coming up- Lung MD 6/13, heart test (Ultrasound) 6/14, Primary Care MD 6/20, Heart MD 6/28.       Plan:  As discussed, plan to follow up again next week telephonically.    Lauren Bartlett.   [place] Care Management  [phone]

## 2016-08-27 NOTE — Telephone Encounter (Signed)
°*  STAT* If patient is at the pharmacy, call can be transferred to refill team.   1. Which medications need to be refilled? (please list name of each medication and dose if known)  Frusemide and Metoprolol   2. Which pharmacy/location (including street and city if local pharmacy) is medication to be sent to? [facility]   3. Do they need a 30 day or 90 day supply? [place]

## 2016-08-27 NOTE — Telephone Encounter (Signed)
Called spoke with pt, advised per Dr Fletcher Anon and Dr Guy Sandifer discussion pt can stop taking Warfarin.  Pt verbalized understanding has not taken today, and will not take any more doses of Warfarin.  Advised pt to continue taking her Aspirin daily as previously instructed and keep scheduled follow-up appt with Dr Fletcher Anon in clinic.  Cancelled pt's upcoming Coumadin Clinic appt since she is discontinuing Coumadin as of today.

## 2016-08-27 NOTE — Telephone Encounter (Signed)
Rx for Metoprolol and Furosemide was filled by Dr. Derrel Nip yesterday.

## 2016-08-27 NOTE — Telephone Encounter (Signed)
Per [name]: "The patient is having recurrent nose bleed. I discussed with [name] and we decided to stop Warfarin. Please inform the patient and ask her to continue daily Aspirin. "  S/w pt who verbalized understanding to discontinue warfarin and continue 81mg  aspirin. Medication list updated and coumadin appt cancelled by [facility].

## 2016-08-28 ENCOUNTER — Other Ambulatory Visit: Payer: Self-pay | Admitting: Internal Medicine

## 2016-08-28 DIAGNOSIS — L03313 Cellulitis of chest wall: Secondary | ICD-10-CM | POA: Diagnosis not present

## 2016-08-28 DIAGNOSIS — R262 Difficulty in walking, not elsewhere classified: Secondary | ICD-10-CM | POA: Diagnosis not present

## 2016-08-28 DIAGNOSIS — Z952 Presence of prosthetic heart valve: Secondary | ICD-10-CM | POA: Diagnosis not present

## 2016-08-28 DIAGNOSIS — M6281 Muscle weakness (generalized): Secondary | ICD-10-CM | POA: Diagnosis not present

## 2016-08-28 DIAGNOSIS — I10 Essential (primary) hypertension: Secondary | ICD-10-CM | POA: Diagnosis not present

## 2016-08-28 NOTE — Telephone Encounter (Signed)
Pt called and stated that the pharamacy does not have these refills. Please advise, thank you!

## 2016-08-28 NOTE — Telephone Encounter (Signed)
Spoke with pt and informed her that I just called the pharmacy and gave them a verbal for the rxs requested yesterday because they had not received the fax ones.

## 2016-08-29 ENCOUNTER — Ambulatory Visit: Payer: Medicare Other | Admitting: Cardiovascular Disease

## 2016-09-01 DIAGNOSIS — I10 Essential (primary) hypertension: Secondary | ICD-10-CM | POA: Diagnosis not present

## 2016-09-01 DIAGNOSIS — Z952 Presence of prosthetic heart valve: Secondary | ICD-10-CM | POA: Diagnosis not present

## 2016-09-01 DIAGNOSIS — R262 Difficulty in walking, not elsewhere classified: Secondary | ICD-10-CM | POA: Diagnosis not present

## 2016-09-01 DIAGNOSIS — M6281 Muscle weakness (generalized): Secondary | ICD-10-CM | POA: Diagnosis not present

## 2016-09-01 DIAGNOSIS — L03313 Cellulitis of chest wall: Secondary | ICD-10-CM | POA: Diagnosis not present

## 2016-09-04 ENCOUNTER — Ambulatory Visit: Payer: Medicare Other | Admitting: *Deleted

## 2016-09-04 ENCOUNTER — Encounter: Payer: Self-pay | Admitting: *Deleted

## 2016-09-04 ENCOUNTER — Other Ambulatory Visit: Payer: Self-pay | Admitting: *Deleted

## 2016-09-04 DIAGNOSIS — I10 Essential (primary) hypertension: Secondary | ICD-10-CM | POA: Diagnosis not present

## 2016-09-04 DIAGNOSIS — R262 Difficulty in walking, not elsewhere classified: Secondary | ICD-10-CM | POA: Diagnosis not present

## 2016-09-04 DIAGNOSIS — Z952 Presence of prosthetic heart valve: Secondary | ICD-10-CM | POA: Diagnosis not present

## 2016-09-04 DIAGNOSIS — L03313 Cellulitis of chest wall: Secondary | ICD-10-CM | POA: Diagnosis not present

## 2016-09-04 DIAGNOSIS — M6281 Muscle weakness (generalized): Secondary | ICD-10-CM | POA: Diagnosis not present

## 2016-09-04 NOTE — Patient Outreach (Signed)
[facility]) Care Management [place] Telephone Outreach, Transition of Care  09/04/2016  CHARMON THORSON 09-14-1955 [id number]  Successful telephone outreach to Lauren Bartlett, 61 y/o female followed by [facility] for transition of care after recent hospitalization/ SNF visit for rehabilitation after mitral valve repair; patient has history of COPD and CHF.  HIPAA/ identity verified with patient during phone call today.  Today, patient reports that "everything is going real good," and she denies pain, problems, concerns.  Patient sounds to be in no obvious distress throughout today's phone call.  -- Reports that she received all of the medications she was waiting on from her mail-order pharmacy; reports that she has and is taking all medications as prescribed.  Denies questions/ concerns around medications.  -- Reports HH PT will make their last visit today; patient reports that she believes she has made good progress with [facility] PT and feels ready for discharge from [facility] services.  -- Reports that she has continued monitoring and recording daily weights, reports weight today of 217 lbs.  Denies concerns around LE swelling, shortness of breath, states, "I am breathing fine."  Patient denies further issues, concerns, or problems today.  I explained that I was contacting patient for primary [facility] RN CM Kathie Rhodes, and that [facility] would contact patient again next week for ongoing transition of care; patient verbalized understanding and agreement with this plan.  Plan:  Will update patient's primary [facility] RN CM of today's successful telephone outreach to patient  Oneta Rack, RN, BSN, [place] Coordinator [facility] Care Management  [phone]

## 2016-09-05 DIAGNOSIS — J449 Chronic obstructive pulmonary disease, unspecified: Secondary | ICD-10-CM | POA: Diagnosis not present

## 2016-09-07 ENCOUNTER — Ambulatory Visit
Admission: EM | Admit: 2016-09-07 | Discharge: 2016-09-07 | Disposition: A | Discharge status: Home / Self Care | Payer: Medicare Other | Attending: Internal Medicine | Admitting: Internal Medicine

## 2016-09-07 DIAGNOSIS — H60311 Diffuse otitis externa, right ear: Secondary | ICD-10-CM

## 2016-09-07 MED ORDER — CIPROFLOXACIN-HYDROCORTISONE 0.2-1 % OT SUSP: 3.0000 [drp] | Freq: Two times a day (BID) | OTIC | 0 refills | Status: AC

## 2016-09-07 NOTE — ED Triage Notes (Signed)
Onset 2 days as per patient has ear pain Right side.

## 2016-09-07 NOTE — Discharge Instructions (Signed)
Use Cipro-HC ear drops- place 3 drops in right ear twice a day as directed. Do not get water in ear. Follow-up with your ENT (Dr. Tami Ribas) in 3 days if not improving.

## 2016-09-08 NOTE — ED Provider Notes (Signed)
CSN: [id number]     Arrival date & time 09/07/16  1421 History   First MD Initiated Contact with Patient 09/07/16 1528     Chief Complaint  Patient presents with  . Otalgia   (Consider location/radiation/quality/duration/timing/severity/associated sxs/prior Treatment) 61 year old female presents with right ear pressure, decreased hearing and slight pain that started yesterday. Denies any drainage, fever or left ear pain. Also experiencing mild nasal congestion and chronic chest congestion/cough. Has history of multiple chronic health conditions, including COPD, HTN, heart failure, anxiety, depression, sciatica, and thyroid disease - on over 20 medications and is allergic or has a reaction to many antibiotics. Has history of recurrent ear issues and nose bleeds- sees Dr. Tami Ribas- ENT.    The history is provided by the patient.    Past Medical History:  Diagnosis Date  . Anxiety   . Bleeding from the nose   . Chronic diastolic congestive heart failure ([place])    a. 03/2016 Echo: EF 55-60%, Gr2 DD.  [name] Congenital absence of one kidney   . COPD (chronic obstructive pulmonary disease) ([place])   . Depression    treated at [place]  . Dyspnea   . Emphysema/COPD ([place])   . GERD (gastroesophageal reflux disease)   . History of sciatica   . Hypertension   . Hypothyroidism   . Mitral Valve Prolapse & Severe mitral regurgitation s/p MVR    a. 03/2016 Echo: severe MVP involving the posterior leaflet, sev MR;  b. 06/2016 minimally invasive MV repair w/ triangular resection of flail segment (P2) of posterior leaflet, gore-tex neochord placement x 4, Sorin Memo 3D rin Annulosplasty (57mm, catalog # [id number], ser # [id number]).  . Motion sickness    cars  . Pneumonia   . PONV (postoperative nausea and vomiting)   . Post-op Afib    a. 06/2016 following MV repair-->amio (dose reduced 2/2 QT prolongation), coumadin.  . Pulmonary hypertension ([place])   . S/P patent foramen ovale closure    a. 06/2016 @ time of  MV Repair.  . Sleep apnea    O2 at night and PRN   Past Surgical History:  Procedure Laterality Date  . ABDOMINAL HYSTERECTOMY    . BREAST BIOPSY Right 2011   [facility]< benign  . CATARACT EXTRACTION W/PHACO Left 11/14/2015   Procedure: CATARACT EXTRACTION PHACO AND INTRAOCULAR LENS PLACEMENT (IOC);  Surgeon: Leandrew Koyanagi, MD;  Location: [place] [facility] [place];  Service: Ophthalmology;  Laterality: Left;  sleep apnea Tor[place]  . CATARACT EXTRACTION W/PHACO Right 12/19/2015   Procedure: CATARACT EXTRACTION PHACO AND INTRAOCULAR LENS PLACEMENT (IOC);  Surgeon: Leandrew Koyanagi, MD;  Location: [place];  Service: Ophthalmology;  Laterality: Right;  ANXIETY GENEROUS IV SEDATION TORIC LEN  . COMBINED HYSTERECTOMY ABDOMINAL W/ A&P REPAIR / OOPHORECTOMY  1996   benign tumor  . INNER EAR SURGERY     bilateral  . MITRAL VALVE REPAIR Right 07/17/2016   Procedure: MINIMALLY INVASIVE MITRAL VALVE REPAIR (MVR) USING 26 SORIN MEMO 3D ANNULOPLASTY RING;  Surgeon: Rexene Alberts, MD;  Location: [place];  Service: Open Heart Surgery;  Laterality: Right;  . PATENT FORAMEN OVALE(PFO) CLOSURE N/A 07/17/2016   Procedure: PATENT FORAMEN OVALE (PFO) CLOSURE;  Surgeon: Rexene Alberts, MD;  Location: [place];  Service: Open Heart Surgery;  Laterality: N/A;  . RIGHT/LEFT HEART CATH AND CORONARY ANGIOGRAPHY Bilateral 06/16/2016   Procedure: Right/Left Heart Cath and Coronary Angiography;  Surgeon: Wellington Hampshire, MD;  Location: [facility];  Service: Cardiovascular;  Laterality: Bilateral;  . TEE WITHOUT CARDIOVERSION N/A 05/19/2016   Procedure: TRANSESOPHAGEAL ECHOCARDIOGRAM (TEE);  Surgeon: Wellington Hampshire, MD;  Location: [facility];  Service: Cardiovascular;  Laterality: N/A;  . TEE WITHOUT CARDIOVERSION N/A 06/09/2016   Procedure: TRANSESOPHAGEAL ECHOCARDIOGRAM (TEE);  Surgeon: Wellington Hampshire, MD;  Location: [facility];  Service: Cardiovascular;  Laterality: N/A;  . TEE WITHOUT CARDIOVERSION N/A  06/16/2016   Procedure: TRANSESOPHAGEAL ECHOCARDIOGRAM (TEE);  Surgeon: Wellington Hampshire, MD;  Location: [facility];  Service: Cardiovascular;  Laterality: N/A;  . TEE WITHOUT CARDIOVERSION N/A 07/17/2016   Procedure: TRANSESOPHAGEAL ECHOCARDIOGRAM (TEE);  Surgeon: Rexene Alberts, MD;  Location: [place];  Service: Open Heart Surgery;  Laterality: N/A;  . TONSILLECTOMY     Family History  Problem Relation Age of Onset  . Multiple sclerosis Mother   . Hypertension Mother   . Coronary artery disease Father   . Heart disease Father   . Hypertension Father   . Heart disease Brother   . Cancer Brother   . Breast cancer Paternal Aunt    Social History  Substance Use Topics  . Smoking status: Never Smoker  . Smokeless tobacco: Never Used  . Alcohol use No   OB History    No data available     Review of Systems  Constitutional: Negative for appetite change, chills, fatigue and fever.  HENT: Positive for congestion, ear pain, hearing loss, nosebleeds and postnasal drip. Negative for ear discharge, facial swelling, rhinorrhea, sinus pain, sinus pressure, sneezing, sore throat and trouble swallowing.   Respiratory: Positive for cough and shortness of breath. Negative for chest tightness and wheezing.   Cardiovascular: Negative for chest pain.  Gastrointestinal: Negative for abdominal pain, nausea and vomiting.  Musculoskeletal: Negative for neck pain and neck stiffness.  Skin: Negative for rash and wound.  Neurological: Negative for dizziness, syncope, weakness, numbness and headaches.  Hematological: Negative for adenopathy.  Psychiatric/Behavioral: The patient is nervous/anxious.     Allergies  Codeine; Levaquin [levofloxacin]; Penicillins; Zithromax [azithromycin]; and Prednisone  Home Medications   Prior to Admission medications   Medication Sig Start Date End Date Taking? Authorizing Provider  acetaminophen (TYLENOL) 500 MG tablet Take 500 mg by mouth 2 (two) times daily as needed  for moderate pain or headache.   Yes [provider]  albuterol (PROVENTIL HFA;VENTOLIN HFA) 108 (90 Base) MCG/ACT inhaler Inhale 2 puffs into the lungs every 6 (six) hours as needed for wheezing or shortness of breath. Pt uses with SPACER.   Yes [provider]  albuterol (PROVENTIL) (2.5 MG/3ML) 0.083% nebulizer solution Take 2.5 mg by nebulization every 6 (six) hours as needed for wheezing or shortness of breath.   Yes [provider]  aspirin EC 81 MG EC tablet Take 1 tablet (81 mg total) by mouth daily. 07/23/16  Yes Barrett, Erin R, PA-C  benzonatate (TESSALON) 200 MG capsule Take 1 capsule (200 mg total) by mouth 3 (three) times daily as needed for cough. 07/04/16  Yes [name], MD  docusate sodium (COLACE) 100 MG capsule TAKE 2 CAPSULES BY MOUTH EVERY DAY 08/15/16  Yes [name], MD  famotidine (PEPCID AC) 10 MG chewable tablet Chew 10 mg by mouth daily as needed for heartburn.   Yes [provider]  ferrous sulfate 325 (65 FE) MG tablet Take 325 mg by mouth daily with breakfast.   Yes [provider]  fluticasone (FLONASE) 50 MCG/ACT nasal spray Place 2 sprays into both nostrils daily. 08/18/16  Yes [name], MD  fluticasone (FLOVENT HFA) 110 MCG/ACT inhaler Inhale 1 puff into the lungs 2 (two) times daily. 08/18/16  Yes [name], MD  furosemide (LASIX) 40 MG tablet Take 1 tablet (40 mg total) by mouth daily. 08/26/16  Yes [name], MD  hydrochlorothiazide (HYDRODIURIL) 25 MG tablet TAKE ONE-HALF TABLET BY MOUTH EVERY DAY 08/28/16  Yes [name], MD  hydroxypropyl methylcellulose / hypromellose (ISOPTO TEARS / GONIOVISC) 2.5 % ophthalmic solution Place 1 drop into both eyes 2 (two) times daily.   Yes [provider]  Hypromellose (ISOPTO TEARS) 0.5 % SOLN Apply 1 drop to eye 2 (two) times daily.   Yes [provider]  ipratropium (ATROVENT) 0.03 % nasal spray USE 2 SPRAYS NASALLY THREE TIMES DAILY  06/23/16  Yes Laverle Hobby, MD  levothyroxine (SYNTHROID, LEVOTHROID) 125 MCG tablet Take 1 tablet (125 mcg total) by mouth daily before breakfast. 08/18/16  Yes [name], MD  loratadine-pseudoephedrine (CLARITIN-D 24-HOUR) 10-240 MG 24 hr tablet Take 1 tablet by mouth daily.   Yes [provider]  LORazepam (ATIVAN) 0.5 MG tablet Take 1 tablet (0.5 mg total) by mouth every 8 (eight) hours as needed for anxiety. 08/21/16 08/21/17 Yes [name], MD  losartan (COZAAR) 100 MG tablet TAKE 1 TABLET BY MOUTH EVERY DAY 08/28/16  Yes [name], MD  metoprolol tartrate (LOPRESSOR) 25 MG tablet Take 0.5 tablets (12.5 mg total) by mouth 2 (two) times daily. 08/26/16  Yes [name], MD  montelukast (SINGULAIR) 10 MG tablet Take 1 tablet (10 mg total) by mouth at bedtime. 06/30/16  Yes [name], MD  OXYGEN Inhale 2 L into the lungs at bedtime as needed (at bedtime and as needed).   Yes [provider]  oxymetazoline (AFRIN) 0.05 % nasal spray Place 1 spray into both nostrils 2 (two) times daily. As needed for nose bleed   Yes [provider]  polyethylene glycol powder (GLYCOLAX/MIRALAX) powder Take 17 g by mouth 2 (two) times daily as needed. Patient taking differently: Take 17 g by mouth daily as needed for mild constipation.  08/03/15  Yes [name], MD  traMADol (ULTRAM) 50 MG tablet Take 1-2 tablets (50-100 mg total) by mouth every 4 (four) hours as needed for moderate pain. 08/21/16  Yes [name], MD  umeclidinium-vilanterol (ANORO ELLIPTA) 62.5-25 MCG/INH AEPB Inhale 1 puff into the lungs daily. 04/28/16  Yes [name], MD  vitamin B-12 (CYANOCOBALAMIN) 1000 MCG tablet Take 1 tablet (1,000 mcg total) by mouth daily. 08/14/16  Yes [name], MD  ciprofloxacin-hydrocortisone (CIPRO HC) OTIC suspension Place 3 drops into the right ear 2 (two) times daily. 09/07/16 09/14/16  Katy Apo, NP   Meds Ordered and Administered this  Visit  Medications - No data to display  BP 127/82 (BP Location: Left Arm)   Pulse 91   Temp 98.3 F (36.8 C)   Resp 16   SpO2 100%  No data found.   Physical Exam  Constitutional: She is oriented to person, place, and time. She appears well-developed and well-nourished. No distress.  HENT:  Head: Normocephalic and atraumatic.  Right Ear: External ear normal. There is swelling and tenderness. Decreased hearing is noted.  Left Ear: Hearing, tympanic membrane, external ear and ear canal normal.  Nose: Rhinorrhea present. Right sinus exhibits no maxillary sinus tenderness and no frontal sinus tenderness. Left sinus exhibits no maxillary sinus tenderness and  no frontal sinus tenderness.  Mouth/Throat: Uvula is midline, oropharynx is clear and moist and mucous membranes are normal.  Right ear canal with white to yellow drainage completely blocking TM. Canal is swollen and red.   Neck: Normal range of motion. Neck supple.  Cardiovascular: Normal rate and regular rhythm.   Murmur heard. Pulmonary/Chest: Effort normal. No respiratory distress. She has decreased breath sounds. She has wheezes.  Lymphadenopathy:    She has no cervical adenopathy.  Neurological: She is alert and oriented to person, place, and time.  Skin: Skin is warm and dry.  Psychiatric: Her speech is normal and behavior is normal. Judgment and thought content normal. Her mood appears anxious. Cognition and memory are normal.    Urgent Care Course     Procedures (including critical care time)  Labs Review Labs Reviewed - No data to display  Imaging Review No results found.   Visual Acuity Review  Right Eye Distance:   Left Eye Distance:   Bilateral Distance:    Right Eye Near:   Left Eye Near:    Bilateral Near:         MDM   1. Acute diffuse otitis externa of right ear    Even though patient has had a reaction to Levaquin (GI issues), I believe she will be able to tolerate Cipro ear drops.  Recommend Cipro HC 3 drops in the right ear twice a day as directed. Avoid getting any water in her ear. May take Tylenol as needed for pain. Follow-up with her ENT (Dr. Tami Ribas) in 3 days if not improving.      Katy Apo, NP 09/08/16 1420

## 2016-09-10 ENCOUNTER — Other Ambulatory Visit: Payer: Self-pay | Admitting: *Deleted

## 2016-09-10 ENCOUNTER — Ambulatory Visit: Payer: Medicare Other | Admitting: Pulmonary Disease

## 2016-09-10 NOTE — Patient Outreach (Signed)
Transition of care call successful, ongoing follow up on SNF discharge 5/14, admitted to rehab after recent hospitalization 4/19-4/25 for mitral valve repair,chronic diastolic CHF, COPD exacerbation. Also follow up on recent ED visit 6/10- right ear pressure.  Spoke with pt, HIPAA identifiers verified.  Pt reports on recent ED visit 6/10- still have right ear pressure- little better.  Pt reports she started Cipro ear drops 6/11, taking as ordered.  Pt reports she is taking Dramamine for dizzy spells, thinks coming from ear, does not take a bath alone- still has an  aide 5 days a week from [facility].   Pt reports HH PT, RN discharged.   Pt reports taking all medications as ordered, still waiting for some from mail order.   Pt reports weights are the same- 216 lbs, no edema/sob/chest pain.     Plan:  As discussed with pt, plan to follow up again next week for final transition of care call.    Zara Chess.   [facility]  [phone]

## 2016-09-11 ENCOUNTER — Other Ambulatory Visit: Payer: Medicare Other

## 2016-09-15 ENCOUNTER — Other Ambulatory Visit: Payer: Self-pay | Admitting: *Deleted

## 2016-09-15 ENCOUNTER — Ambulatory Visit (INDEPENDENT_AMBULATORY_CARE_PROVIDER_SITE_OTHER): Payer: Medicare Other

## 2016-09-15 ENCOUNTER — Other Ambulatory Visit: Payer: Self-pay

## 2016-09-15 DIAGNOSIS — R040 Epistaxis: Secondary | ICD-10-CM | POA: Diagnosis not present

## 2016-09-15 DIAGNOSIS — H66009 Acute suppurative otitis media without spontaneous rupture of ear drum, unspecified ear: Secondary | ICD-10-CM | POA: Diagnosis not present

## 2016-09-15 DIAGNOSIS — I5032 Chronic diastolic (congestive) heart failure: Secondary | ICD-10-CM

## 2016-09-15 DIAGNOSIS — R42 Dizziness and giddiness: Secondary | ICD-10-CM | POA: Diagnosis not present

## 2016-09-15 NOTE — Patient Outreach (Signed)
Final transition of care call successful, ongoing follow up on SNF discharge 5/14, admitted to rehab after recent hospitalization 4/19-4/25 for mitral valve repair, chronic diastolic CHF, COPD exacerbation.   Spoke with pt, HIPAA verified.   Pt reports doing better with right ear pressure, to see ENT MD today, still taking antibiotic, dizziness better.   Pt reports on recent weights- staying same/ 217 lbs this weekend, today 217.6 lbs , no edema/sob/chest pain.   RN CM discussed with pt today final transition of care call, plan to follow up again next month telephonically.     Plan:  As discussed with pt, plan to follow up again next month telephonically.    Lauren Bartlett.   [facility]  [phone]

## 2016-09-16 ENCOUNTER — Other Ambulatory Visit: Payer: Self-pay | Admitting: Internal Medicine

## 2016-09-16 DIAGNOSIS — Z1231 Encounter for screening mammogram for malignant neoplasm of breast: Secondary | ICD-10-CM

## 2016-09-17 ENCOUNTER — Ambulatory Visit (INDEPENDENT_AMBULATORY_CARE_PROVIDER_SITE_OTHER): Payer: Medicare Other | Admitting: Internal Medicine

## 2016-09-17 ENCOUNTER — Encounter: Payer: Self-pay | Admitting: Internal Medicine

## 2016-09-17 VITALS — BP 102/72 | HR 90 | Temp 98.3°F | Resp 16 | Ht 62.0 in | Wt 219.2 lb

## 2016-09-17 DIAGNOSIS — J42 Unspecified chronic bronchitis: Secondary | ICD-10-CM | POA: Diagnosis not present

## 2016-09-17 DIAGNOSIS — H6690 Otitis media, unspecified, unspecified ear: Secondary | ICD-10-CM | POA: Diagnosis not present

## 2016-09-17 DIAGNOSIS — E034 Atrophy of thyroid (acquired): Secondary | ICD-10-CM | POA: Diagnosis not present

## 2016-09-17 DIAGNOSIS — I340 Nonrheumatic mitral (valve) insufficiency: Secondary | ICD-10-CM | POA: Diagnosis not present

## 2016-09-17 LAB — COMPREHENSIVE METABOLIC PANEL
ALK PHOS: 76 U/L (ref 39–117)
ALT: 13 U/L (ref 0–35)
AST: 17 U/L (ref 0–37)
Albumin: 4.2 g/dL (ref 3.5–5.2)
BUN: 18 mg/dL (ref 6–23)
CO2: 31 meq/L (ref 19–32)
CREATININE: 0.87 mg/dL (ref 0.40–1.20)
Calcium: 8.6 mg/dL (ref 8.4–10.5)
Chloride: 101 mEq/L (ref 96–112)
GFR: 70.4 mL/min (ref >60.00)
GLUCOSE: 80 mg/dL (ref 70–99)
Potassium: 4 mEq/L (ref 3.5–5.1)
SODIUM: 141 meq/L (ref 135–145)
TOTAL PROTEIN: 7.2 g/dL (ref 6.0–8.3)
Total Bilirubin: 0.4 mg/dL (ref 0.2–1.2)

## 2016-09-17 LAB — T4: T4, Total: 11.8 ug/dL (ref 4.5–12.0)

## 2016-09-17 LAB — CALCIUM, IONIZED: CALCIUM ION: 4.2 mg/dL — AB (ref 4.8–5.6)

## 2016-09-17 LAB — TSH: TSH: 1.59 mIU/L

## 2016-09-17 NOTE — Patient Instructions (Signed)
If you do not have any more dizziness,  You do not need to keep the appointment for the "dizzy table"  I'll see you in 6 months

## 2016-09-17 NOTE — Progress Notes (Signed)
Subjective:  Patient ID: Lauren Bartlett, female    DOB: 05-06-1955  Age: 61 y.o. MRN: [id number]  CC: The primary encounter diagnosis was Hypothyroidism due to acquired atrophy of thyroid. Diagnoses of Hypocalcemia, Chronic bronchitis, unspecified chronic bronchitis type ([place]), Severe mitral regurgitation, and Acute otitis media, unspecified otitis media type were also pertinent to this visit.  HPI [name] presents for follow up on recent ER visit for acute otitis media of right  ear that presented with  decreased hearing, ear pain and vertigo on June 10.  Symptoms Started after several days of persistent sinus congestion. She was prescribed Cipro HC otic and tylenol bid, and pain improved significantly.  She  Was referred to ENT saw Tami Ribas on  Jun 18 and he stopped the medication and the flonase. She is now taking just claritin. The dizziness has resolved.   Weight gain of 4 lbs since last visit.  Eager to resume her walking program butr hindered by the heat and the recent ear infection.  Last thyroid check was normal but over 18 months ago.  Has been taking levothryoxine 125 mcg total   Lab Results  Component Value Date   TSH 1.59 09/17/2016     Outpatient Medications Prior to Visit  Medication Sig Dispense Refill  . acetaminophen (TYLENOL) 500 MG tablet Take 500 mg by mouth 2 (two) times daily as needed for moderate pain or headache.    . albuterol (PROVENTIL HFA;VENTOLIN HFA) 108 (90 Base) MCG/ACT inhaler Inhale 2 puffs into the lungs every 6 (six) hours as needed for wheezing or shortness of breath. Pt uses with SPACER.    [name] albuterol (PROVENTIL) (2.5 MG/3ML) 0.083% nebulizer solution Take 2.5 mg by nebulization every 6 (six) hours as needed for wheezing or shortness of breath.    [name] aspirin EC 81 MG EC tablet Take 1 tablet (81 mg total) by mouth daily.    . benzonatate (TESSALON) 200 MG capsule Take 1 capsule (200 mg total) by mouth 3 (three) times daily as needed for cough. 60  capsule 1  . docusate sodium (COLACE) 100 MG capsule TAKE 2 CAPSULES BY MOUTH EVERY DAY 60 capsule 5  . famotidine (PEPCID AC) 10 MG chewable tablet Chew 10 mg by mouth daily as needed for heartburn.    . ferrous sulfate 325 (65 FE) MG tablet Take 325 mg by mouth daily with breakfast.    . fluticasone (FLONASE) 50 MCG/ACT nasal spray Place 2 sprays into both nostrils daily. 16 g 2  . fluticasone (FLOVENT HFA) 110 MCG/ACT inhaler Inhale 1 puff into the lungs 2 (two) times daily. 1 Inhaler 5  . furosemide (LASIX) 40 MG tablet Take 1 tablet (40 mg total) by mouth daily. 30 tablet 2  . Hypromellose (ISOPTO TEARS) 0.5 % SOLN Apply 1 drop to eye 2 (two) times daily.    [name] ipratropium (ATROVENT) 0.03 % nasal spray USE 2 SPRAYS NASALLY THREE TIMES DAILY 90 mL 1  . levothyroxine (SYNTHROID, LEVOTHROID) 125 MCG tablet Take 1 tablet (125 mcg total) by mouth daily before breakfast. 30 tablet 5  . loratadine-pseudoephedrine (CLARITIN-D 24-HOUR) 10-240 MG 24 hr tablet Take 1 tablet by mouth daily.    [name] LORazepam (ATIVAN) 0.5 MG tablet Take 1 tablet (0.5 mg total) by mouth every 8 (eight) hours as needed for anxiety. 30 tablet 0  . metoprolol tartrate (LOPRESSOR) 25 MG tablet Take 0.5 tablets (12.5 mg total) by mouth 2 (two) times daily. 60 tablet 2  .  montelukast (SINGULAIR) 10 MG tablet Take 1 tablet (10 mg total) by mouth at bedtime. 30 tablet 3  . OXYGEN Inhale 2 L into the lungs at bedtime as needed (at bedtime and as needed).    [name] oxymetazoline (AFRIN) 0.05 % nasal spray Place 1 spray into both nostrils 2 (two) times daily. As needed for nose bleed    . polyethylene glycol powder (GLYCOLAX/MIRALAX) powder Take 17 g by mouth 2 (two) times daily as needed. (Patient taking differently: Take 17 g by mouth daily as needed for mild constipation. ) 3350 g 1  . traMADol (ULTRAM) 50 MG tablet Take 1-2 tablets (50-100 mg total) by mouth every 4 (four) hours as needed for moderate pain. 30 tablet 0  .  umeclidinium-vilanterol (ANORO ELLIPTA) 62.5-25 MCG/INH AEPB Inhale 1 puff into the lungs daily. 1 each 11  . vitamin B-12 (CYANOCOBALAMIN) 1000 MCG tablet Take 1 tablet (1,000 mcg total) by mouth daily. 90 tablet 3  . hydrochlorothiazide (HYDRODIURIL) 25 MG tablet TAKE ONE-HALF TABLET BY MOUTH EVERY DAY (Patient not taking: Reported on 09/17/2016) 30 tablet 2  . hydroxypropyl methylcellulose / hypromellose (ISOPTO TEARS / GONIOVISC) 2.5 % ophthalmic solution Place 1 drop into both eyes 2 (two) times daily.    [name] losartan (COZAAR) 100 MG tablet TAKE 1 TABLET BY MOUTH EVERY DAY (Patient not taking: Reported on 09/17/2016) 30 tablet 2   No facility-administered medications prior to visit.     Review of Systems;  Patient denies headache, fevers, malaise, unintentional weight loss, skin rash, eye pain, sinus congestion and sinus pain, sore throat, dysphagia,  hemoptysis , cough, dyspnea, wheezing, chest pain, palpitations, orthopnea, edema, abdominal pain, nausea, melena, diarrhea, constipation, flank pain, dysuria, hematuria, urinary  Frequency, nocturia, numbness, tingling, seizures,  Focal weakness, Loss of consciousness,  Tremor, insomnia, depression, anxiety, and suicidal ideation.      Objective:  BP 102/72 (BP Location: Left Arm, Patient Position: Sitting, Cuff Size: Normal)   Pulse 90   Temp 98.3 F (36.8 C) (Oral)   Resp 16   Ht 5\' 2"  (1.575 m)   Wt 219 lb 3.2 oz (99.4 kg)   SpO2 96%   BMI 40.09 kg/m   BP Readings from Last 3 Encounters:  09/17/16 102/72  09/07/16 127/82  08/22/16 128/84    Wt Readings from Last 3 Encounters:  09/17/16 219 lb 3.2 oz (99.4 kg)  08/22/16 217 lb 3.2 oz (98.5 kg)  08/15/16 216 lb (98 kg)    General appearance: alert, cooperative and appears stated age Ears: normal TM's and external ear canals both ears Throat: lips, mucosa, and tongue normal; teeth and gums normal Neck: no adenopathy, no carotid bruit, supple, symmetrical, trachea midline and  thyroid not enlarged, symmetric, no tenderness/mass/nodules Back: symmetric, no curvature. ROM normal. No CVA tenderness. Lungs: clear to auscultation bilaterally Heart: regular rate and rhythm, S1, S2 normal, no murmur, click, rub or gallop Abdomen: soft, non-tender; bowel sounds normal; no masses,  no organomegaly Pulses: 2+ and symmetric Skin: Skin color, texture, turgor normal. No rashes or lesions Lymph nodes: Cervical, supraclavicular, and axillary nodes normal.  Lab Results  Component Value Date   HGBA1C 5.1 07/14/2016   HGBA1C 5.4 07/28/2015    Lab Results  Component Value Date   CREATININE 0.87 09/17/2016   CREATININE 0.91 08/15/2016   CREATININE 0.86 07/21/2016    Lab Results  Component Value Date   WBC 8.6 08/13/2016   HGB 10.4 (L) 08/13/2016   HCT 31.7 (L) 08/13/2016  PLT 225 08/13/2016   GLUCOSE 80 09/17/2016   CHOL 173 03/14/2015   TRIG 85.0 03/14/2015   HDL 57.10 03/14/2015   LDLCALC 99 03/14/2015   ALT 13 09/17/2016   AST 17 09/17/2016   NA 141 09/17/2016   K 4.0 09/17/2016   CL 101 09/17/2016   CREATININE 0.87 09/17/2016   BUN 18 09/17/2016   CO2 31 09/17/2016   TSH 1.59 09/17/2016   INR 1.4 08/26/2016   HGBA1C 5.1 07/14/2016    No results found.  Assessment & Plan:   Problem List Items Addressed This Visit    Severe mitral regurgitation (Chronic)    She is doing well post operatively from her mitral valve repair and reports improved exercise tolerance and no dyspneic episodes since her surgery .      Otitis media    She was treated in ED for allergic rhinitis and otitis externa , not otitis media despite complaints of pain, hearing loss and dizziness.  She  followed up with ENT , who dc'd the cipro otic drops and the flonase. Her symptoms have resolved. Continue claritin daily as this was likely allergy mediated      Hypothyroidism - Primary (Chronic)    Thyroid function is WNL on current dose.  No current changes needed.   Lab Results    Component Value Date   TSH 1.59 09/17/2016         Relevant Orders   T4 (Completed)   TSH (Completed)   Hypocalcemia    Notedon ED labs.   Today's ionized calcium is low . She is asymptomatic .   Will have her return for PTH, vitamin d and magnesium levels    Lab Results  Component Value Date   CALCIUM 8.6 09/17/2016   PHOS 4.1 11/03/2012        Relevant Orders   Calcium, ionized (Completed)   Comprehensive metabolic panel (Completed)   Magnesium   PTH, intact (no Ca)   VITAMIN D 25 Hydroxy (Vit-D Deficiency, Fractures)   COPD (chronic obstructive pulmonary disease) (HCC) (Chronic)    Symptoms have been quiescent since her mitral valve repair. No changes to regimen today         A total of 40 minutes was spent with patient more than half of which was spent in counseling patient on the above mentioned issues , reviewing and explaining recent labs and imaging studies done, and coordination of care.  I have discontinued Ms. Hirschfeld's hydroxypropyl methylcellulose / hypromellose, hydrochlorothiazide, and losartan. I am also having her maintain her loratadine-pseudoephedrine, polyethylene glycol powder, albuterol, OXYGEN, albuterol, umeclidinium-vilanterol, famotidine, acetaminophen, ipratropium, montelukast, benzonatate, Hypromellose, aspirin, ferrous sulfate, vitamin B-12, docusate sodium, fluticasone, levothyroxine, fluticasone, LORazepam, traMADol, oxymetazoline, furosemide, and metoprolol tartrate.  No orders of the defined types were placed in this encounter.   Medications Discontinued During This Encounter  Medication Reason  . hydroxypropyl methylcellulose / hypromellose (ISOPTO TEARS / GONIOVISC) 2.5 % ophthalmic solution Patient has not taken in last 30 days  . losartan (COZAAR) 100 MG tablet Patient has not taken in last 30 days  . hydrochlorothiazide (HYDRODIURIL) 25 MG tablet Patient has not taken in last 30 days    Follow-up: No Follow-up on file.   [name], MD

## 2016-09-20 ENCOUNTER — Telehealth: Payer: Self-pay | Admitting: Internal Medicine

## 2016-09-20 DIAGNOSIS — H6690 Otitis media, unspecified, unspecified ear: Secondary | ICD-10-CM | POA: Insufficient documentation

## 2016-09-20 NOTE — Assessment & Plan Note (Signed)
Notedon ED labs.   Today's ionized calcium is low . She is asymptomatic .   Will have her return for PTH, vitamin d and magnesium levels    Lab Results  Component Value Date   CALCIUM 8.6 09/17/2016   PHOS 4.1 11/03/2012

## 2016-09-20 NOTE — Assessment & Plan Note (Signed)
Thyroid function is WNL on current dose.  No current changes needed.   Lab Results  Component Value Date   TSH 1.59 09/17/2016

## 2016-09-20 NOTE — Telephone Encounter (Signed)
Additional test for an ionized calcium level Came back low.  Not sure what is causing this,  Needs to return to have her magnesium , vitamin d and PTH level checked,  No fasting required , labs ordered

## 2016-09-20 NOTE — Assessment & Plan Note (Signed)
She was treated in ED for allergic rhinitis and otitis externa , not otitis media despite complaints of pain, hearing loss and dizziness.  She  followed up with ENT , who dc'd the cipro otic drops and the flonase. Her symptoms have resolved. Continue claritin daily as this was likely allergy mediated

## 2016-09-20 NOTE — Assessment & Plan Note (Signed)
Symptoms have been quiescent since her mitral valve repair. No changes to regimen today

## 2016-09-20 NOTE — Assessment & Plan Note (Signed)
She is doing well post operatively from her mitral valve repair and reports improved exercise tolerance and no dyspneic episodes since her surgery .

## 2016-09-22 DIAGNOSIS — Z961 Presence of intraocular lens: Secondary | ICD-10-CM | POA: Diagnosis not present

## 2016-09-22 DIAGNOSIS — H43811 Vitreous degeneration, right eye: Secondary | ICD-10-CM | POA: Diagnosis not present

## 2016-09-22 NOTE — Telephone Encounter (Signed)
Spoke with pt and informed her of her lab results. Pt gave a verbal understanding.  

## 2016-09-22 NOTE — Telephone Encounter (Signed)
Pt called back returning your call. Pt is scheduled for labs on 6/27. Pt would like you to call her with her lab results. Please advise thank you!  Call pt @ [phone]

## 2016-09-22 NOTE — Telephone Encounter (Signed)
LMTCB. Need to schedule pt a lab appt. Labs have been ordered.  

## 2016-09-23 ENCOUNTER — Other Ambulatory Visit: Payer: Medicare Other

## 2016-09-24 ENCOUNTER — Other Ambulatory Visit (INDEPENDENT_AMBULATORY_CARE_PROVIDER_SITE_OTHER): Payer: Medicare Other

## 2016-09-24 LAB — MAGNESIUM: MAGNESIUM: 1.9 mg/dL (ref 1.5–2.5)

## 2016-09-24 LAB — VITAMIN D 25 HYDROXY (VIT D DEFICIENCY, FRACTURES): VITD: 19.42 ng/mL — ABNORMAL LOW (ref 30.00–100.00)

## 2016-09-25 ENCOUNTER — Encounter: Payer: Self-pay | Admitting: Cardiovascular Disease

## 2016-09-25 ENCOUNTER — Ambulatory Visit (INDEPENDENT_AMBULATORY_CARE_PROVIDER_SITE_OTHER): Payer: Medicare Other | Admitting: Cardiovascular Disease

## 2016-09-25 VITALS — BP 123/83 | HR 89 | Ht 62.0 in | Wt 218.5 lb

## 2016-09-25 DIAGNOSIS — Z9889 Other specified postprocedural states: Secondary | ICD-10-CM

## 2016-09-25 LAB — PARATHYROID HORMONE, INTACT (NO CA): PTH: 53 pg/mL (ref 14–64)

## 2016-09-25 NOTE — Progress Notes (Signed)
Cardiology Office Note   Date:  09/25/2016   ID:  Lauren Bartlett, DOB 10-09-1955, MRN [id number]  PCP:  Lauren Mc, MD  Cardiologist:  Lauren Bartlett  Chief Complaint  Patient presents with  . OTHER    2 month f/u c/o dizziness, head pressure and numbness by scar on chest. Meds reviewed verbally with pt.      History of Present Illness: Lauren Bartlett is a 61 y.o. female who is here today for a follow-up visit regarding mitral regurgitation. The patient has chronic medical conditions that include COPD, mitral valve prolapse, essential hypertension, anxiety and depression. She is a lifelong nonsmoker but reports being exposed to smoking. She underwent successful mitral valve repair and PFO closure in April of this year. She had postoperative atrial fibrillation that was treated with amiodarone. She was discharged home on warfarin which was subsequently discontinued after she developed epistaxis.  Recent echocardiogram showed normal LV systolic function, intact mitral valve repair, mild to moderate tricuspid regurgitation with moderate pulmonary hypertension. Systolic pulmonary pressure was 54 mmHg.  She has been doing well overall with significant improvement in shortness of breath and stamina. Her only complaint is mild dizziness.   Past Medical History:  Diagnosis Date  . Anxiety   . Bleeding from the nose   . Chronic diastolic congestive heart failure ([place])    a. 03/2016 Echo: EF 55-60%, Gr2 DD.  [name] Congenital absence of one kidney   . COPD (chronic obstructive pulmonary disease) ([place])   . Depression    treated at [facility]  . Dyspnea   . Emphysema/COPD ([place])   . GERD (gastroesophageal reflux disease)   . History of sciatica   . Hypertension   . Hypothyroidism   . Mitral Valve Prolapse & Severe mitral regurgitation s/p MVR    a. 03/2016 Echo: severe MVP involving the posterior leaflet, sev MR;  b. 06/2016 minimally invasive MV repair w/ triangular resection of flail segment  (P2) of posterior leaflet, gore-tex neochord placement x 4, Sorin Memo 3D rin Annulosplasty (49mm, catalog # [id number], ser # [id number]).  . Motion sickness    cars  . Pneumonia   . PONV (postoperative nausea and vomiting)   . Post-op Afib    a. 06/2016 following MV repair-->amio (dose reduced 2/2 QT prolongation), coumadin.  . Pulmonary hypertension ([place])   . S/P patent foramen ovale closure    a. 06/2016 @ time of MV Repair.  . Sleep apnea    O2 at night and PRN    Past Surgical History:  Procedure Laterality Date  . ABDOMINAL HYSTERECTOMY    . BREAST BIOPSY Right 2011   [facility]< benign  . CATARACT EXTRACTION W/PHACO Left 11/14/2015   Procedure: CATARACT EXTRACTION PHACO AND INTRAOCULAR LENS PLACEMENT (IOC);  Surgeon: Leandrew Koyanagi, MD;  Location: [facility];  Service: Ophthalmology;  Laterality: Left;  sleep apnea [place]  . CATARACT EXTRACTION W/PHACO Right 12/19/2015   Procedure: CATARACT EXTRACTION PHACO AND INTRAOCULAR LENS PLACEMENT (IOC);  Surgeon: Leandrew Koyanagi, MD;  Location: [facility];  Service: Ophthalmology;  Laterality: Right;  ANXIETY GENEROUS IV SEDATION TORIC LEN  . COMBINED HYSTERECTOMY ABDOMINAL W/ A&P REPAIR / OOPHORECTOMY  1996   benign tumor  . INNER EAR SURGERY     bilateral  . MITRAL VALVE REPAIR Right 07/17/2016   Procedure: MINIMALLY INVASIVE MITRAL VALVE REPAIR (MVR) USING 26 SORIN MEMO 3D ANNULOPLASTY RING;  Surgeon: Rexene Alberts, MD;  Location: [facility];  Service: Open  Heart Surgery;  Laterality: Right;  . PATENT FORAMEN OVALE(PFO) CLOSURE N/A 07/17/2016   Procedure: PATENT FORAMEN OVALE (PFO) CLOSURE;  Surgeon: Rexene Alberts, MD;  Location: [facility];  Service: Open Heart Surgery;  Laterality: N/A;  . RIGHT/LEFT HEART CATH AND CORONARY ANGIOGRAPHY Bilateral 06/16/2016   Procedure: Right/Left Heart Cath and Coronary Angiography;  Surgeon: Wellington Hampshire, MD;  Location: [facility];  Service: Cardiovascular;  Laterality: Bilateral;    . TEE WITHOUT CARDIOVERSION N/A 05/19/2016   Procedure: TRANSESOPHAGEAL ECHOCARDIOGRAM (TEE);  Surgeon: Wellington Hampshire, MD;  Location: [facility];  Service: Cardiovascular;  Laterality: N/A;  . TEE WITHOUT CARDIOVERSION N/A 06/09/2016   Procedure: TRANSESOPHAGEAL ECHOCARDIOGRAM (TEE);  Surgeon: Wellington Hampshire, MD;  Location: [facility];  Service: Cardiovascular;  Laterality: N/A;  . TEE WITHOUT CARDIOVERSION N/A 06/16/2016   Procedure: TRANSESOPHAGEAL ECHOCARDIOGRAM (TEE);  Surgeon: Wellington Hampshire, MD;  Location: [facility];  Service: Cardiovascular;  Laterality: N/A;  . TEE WITHOUT CARDIOVERSION N/A 07/17/2016   Procedure: TRANSESOPHAGEAL ECHOCARDIOGRAM (TEE);  Surgeon: Rexene Alberts, MD;  Location: [facility];  Service: Open Heart Surgery;  Laterality: N/A;  . TONSILLECTOMY       Current Outpatient Prescriptions  Medication Sig Dispense Refill  . acetaminophen (TYLENOL) 500 MG tablet Take 500 mg by mouth 2 (two) times daily as needed for moderate pain or headache.    . albuterol (PROVENTIL HFA;VENTOLIN HFA) 108 (90 Base) MCG/ACT inhaler Inhale 2 puffs into the lungs every 6 (six) hours as needed for wheezing or shortness of breath. Pt uses with SPACER.    [name] albuterol (PROVENTIL) (2.5 MG/3ML) 0.083% nebulizer solution Take 2.5 mg by nebulization every 6 (six) hours as needed for wheezing or shortness of breath.    [name] aspirin EC 81 MG EC tablet Take 1 tablet (81 mg total) by mouth daily.    . benzonatate (TESSALON) 200 MG capsule Take 1 capsule (200 mg total) by mouth 3 (three) times daily as needed for cough. 60 capsule 1  . docusate sodium (COLACE) 100 MG capsule TAKE 2 CAPSULES BY MOUTH EVERY DAY 60 capsule 5  . famotidine (PEPCID AC) 10 MG chewable tablet Chew 10 mg by mouth daily as needed for heartburn.    . ferrous sulfate 325 (65 FE) MG tablet Take 325 mg by mouth daily with breakfast.    . fluticasone (FLONASE) 50 MCG/ACT nasal spray Place 2 sprays into both nostrils daily. 16 g 2  .  fluticasone (FLOVENT HFA) 110 MCG/ACT inhaler Inhale 1 puff into the lungs 2 (two) times daily. 1 Inhaler 5  . furosemide (LASIX) 40 MG tablet Take 1 tablet (40 mg total) by mouth daily. 30 tablet 2  . Hypromellose (ISOPTO TEARS) 0.5 % SOLN Apply 1 drop to eye 2 (two) times daily.    [name] ipratropium (ATROVENT) 0.03 % nasal spray USE 2 SPRAYS NASALLY THREE TIMES DAILY 90 mL 1  . levothyroxine (SYNTHROID, LEVOTHROID) 125 MCG tablet Take 1 tablet (125 mcg total) by mouth daily before breakfast. 30 tablet 5  . loratadine-pseudoephedrine (CLARITIN-D 24-HOUR) 10-240 MG 24 hr tablet Take 1 tablet by mouth daily.    [name] LORazepam (ATIVAN) 0.5 MG tablet Take 1 tablet (0.5 mg total) by mouth every 8 (eight) hours as needed for anxiety. 30 tablet 0  . metoprolol tartrate (LOPRESSOR) 25 MG tablet Take 0.5 tablets (12.5 mg total) by mouth 2 (two) times daily. 60 tablet 2  . montelukast (SINGULAIR) 10 MG tablet Take 1 tablet (10  mg total) by mouth at bedtime. 30 tablet 3  . OXYGEN Inhale 2 L into the lungs at bedtime as needed (at bedtime and as needed).    [name] oxymetazoline (AFRIN) 0.05 % nasal spray Place 1 spray into both nostrils 2 (two) times daily. As needed for nose bleed    . polyethylene glycol powder (GLYCOLAX/MIRALAX) powder Take 17 g by mouth 2 (two) times daily as needed. (Patient taking differently: Take 17 g by mouth daily as needed for mild constipation. ) 3350 g 1  . traMADol (ULTRAM) 50 MG tablet Take 1-2 tablets (50-100 mg total) by mouth every 4 (four) hours as needed for moderate pain. 30 tablet 0  . umeclidinium-vilanterol (ANORO ELLIPTA) 62.5-25 MCG/INH AEPB Inhale 1 puff into the lungs daily. 1 each 11  . vitamin B-12 (CYANOCOBALAMIN) 1000 MCG tablet Take 1 tablet (1,000 mcg total) by mouth daily. 90 tablet 3   No current facility-administered medications for this visit.     Allergies:   Codeine; Levaquin [levofloxacin]; Penicillins; Zithromax [azithromycin]; and Prednisone    Social  History:  The patient  reports that she has never smoked. She has never used smokeless tobacco. She reports that she does not drink alcohol or use drugs.   Family History:  The patient's family history includes Breast cancer in her paternal aunt; Cancer in her brother; Coronary artery disease in her father; Heart disease in her brother and father; Hypertension in her father and mother; Multiple sclerosis in her mother.    ROS:  Please see the history of present illness.   Otherwise, review of systems are positive for none.   All other systems are reviewed and negative.    PHYSICAL EXAM: VS:  BP 123/83 (BP Location: Left Arm, Patient Position: Sitting, Cuff Size: Large)   Pulse 89   Ht 5\' 2"  (1.575 m)   Wt 218 lb 8 oz (99.1 kg)   BMI 39.96 kg/m  , BMI Body mass index is 39.96 kg/m. GEN: Well nourished, well developed, in no acute distress  HEENT: normal  Neck: no JVD, carotid bruits, or masses Cardiac: RRR; no rubs, or gallops,no edema . No heart murmurs.  Respiratory:  Mild expiratory wheezing, normal work of breathing GI: soft, nontender, nondistended, + BS MS: no deformity or atrophy  Skin: warm and dry, no rash Neuro:  Strength and sensation are intact Psych: euthymic mood, full affect    EKG:  EKG is ordered today. EKG showed normal sinus rhythm with no significant ST or T wave changes.  Recent Labs: 08/13/2016: Hemoglobin 10.4; Platelets 225 09/17/2016: ALT 13; BUN 18; Creatinine, Ser 0.87; Potassium 4.0; Sodium 141; TSH 1.59 09/24/2016: Magnesium 1.9    Lipid Panel    Component Value Date/Time   CHOL 173 03/14/2015 1048   TRIG 85.0 03/14/2015 1048   HDL 57.10 03/14/2015 1048   CHOLHDL 3 03/14/2015 1048   VLDL 17.0 03/14/2015 1048   LDLCALC 99 03/14/2015 1048      Wt Readings from Last 3 Encounters:  09/25/16 218 lb 8 oz (99.1 kg)  09/17/16 219 lb 3.2 oz (99.4 kg)  08/22/16 217 lb 3.2 oz (98.5 kg)      PAD Screen 05/13/2016  Previous PAD dx? No  Previous  surgical procedure? No  Pain with walking? No  Feet/toe relief with dangling? No  Painful, non-healing ulcers? No  Extremities discolored? No      ASSESSMENT AND PLAN:  1.  Severe mitral regurgitation due to mitral valve prolapse:  Status  post successful mitral valve repair and PFO closure.She is doing extremely well with significant improvement in shortness of breath.  2. Postoperative atrial fibrillation: No evidence of recurrent arrhythmia off amiodarone.  3. COPD: I suspect that most of her shortness of breath was related to mitral regurgitation and not COPD   4. Essential hypertension: Blood pressure is controlled. She is only on small dose metoprolol. She complains of mild dizziness and she is mildly orthostatic. I advised her to avoid sudden change in position. Recent labs were unremarkable and there was no evidence of volume depletion.  Disposition:   FU with me in 6 months  Signed,  Kathlyn Sacramento, MD  09/25/2016 11:22 AM    [facility]

## 2016-09-25 NOTE — Patient Instructions (Signed)
Medication Instructions: Continue same medications.   Labwork: None.   Procedures/Testing: None.   Follow-Up: 6 months with Dr. Avereigh Spainhower.   Any Additional Special Instructions Will Be Listed Below (If Applicable).     If you need a refill on your cardiac medications before your next appointment, please call your pharmacy.   

## 2016-09-26 ENCOUNTER — Other Ambulatory Visit: Payer: Self-pay | Admitting: Internal Medicine

## 2016-09-26 MED ORDER — ERGOCALCIFEROL 1.25 MG (50000 UT) PO CAPS: 50000.0000 [IU] | ORAL_CAPSULE | ORAL | 11 refills | Status: DC

## 2016-09-29 ENCOUNTER — Inpatient Hospital Stay: Admission: RE | Admit: 2016-09-29 | Payer: Medicare Other | Source: Ambulatory Visit

## 2016-10-02 DIAGNOSIS — H8113 Benign paroxysmal vertigo, bilateral: Secondary | ICD-10-CM | POA: Diagnosis not present

## 2016-10-02 DIAGNOSIS — H903 Sensorineural hearing loss, bilateral: Secondary | ICD-10-CM | POA: Diagnosis not present

## 2016-10-02 DIAGNOSIS — R42 Dizziness and giddiness: Secondary | ICD-10-CM | POA: Diagnosis not present

## 2016-10-02 DIAGNOSIS — H8110 Benign paroxysmal vertigo, unspecified ear: Secondary | ICD-10-CM | POA: Diagnosis not present

## 2016-10-05 DIAGNOSIS — J449 Chronic obstructive pulmonary disease, unspecified: Secondary | ICD-10-CM | POA: Diagnosis not present

## 2016-10-06 ENCOUNTER — Ambulatory Visit (INDEPENDENT_AMBULATORY_CARE_PROVIDER_SITE_OTHER): Payer: Medicare Other | Admitting: Pulmonary Disease

## 2016-10-06 ENCOUNTER — Encounter: Payer: Self-pay | Admitting: Pulmonary Disease

## 2016-10-06 VITALS — BP 142/90 | HR 91

## 2016-10-06 DIAGNOSIS — G4734 Idiopathic sleep related nonobstructive alveolar hypoventilation: Secondary | ICD-10-CM | POA: Diagnosis not present

## 2016-10-06 DIAGNOSIS — R0609 Other forms of dyspnea: Secondary | ICD-10-CM | POA: Diagnosis not present

## 2016-10-06 DIAGNOSIS — I272 Pulmonary hypertension, unspecified: Secondary | ICD-10-CM | POA: Diagnosis not present

## 2016-10-06 DIAGNOSIS — J449 Chronic obstructive pulmonary disease, unspecified: Secondary | ICD-10-CM

## 2016-10-06 NOTE — Patient Instructions (Signed)
Stop Singulair Stop Flovent Continue Anoro inhalant Continue albuterol inhaler as needed Continue oxygen at night for now We will order an overnight oxygen measurement to determine whether you still need nocturnal oxygen Follow-up in 4 months with PFTs (lung function measurements) prior to that visit

## 2016-10-07 ENCOUNTER — Other Ambulatory Visit: Payer: Self-pay | Admitting: Internal Medicine

## 2016-10-09 DIAGNOSIS — R42 Dizziness and giddiness: Secondary | ICD-10-CM | POA: Diagnosis not present

## 2016-10-09 DIAGNOSIS — H6593 Unspecified nonsuppurative otitis media, bilateral: Secondary | ICD-10-CM | POA: Diagnosis not present

## 2016-10-09 DIAGNOSIS — R2681 Unsteadiness on feet: Secondary | ICD-10-CM | POA: Diagnosis not present

## 2016-10-09 DIAGNOSIS — J0100 Acute maxillary sinusitis, unspecified: Secondary | ICD-10-CM | POA: Diagnosis not present

## 2016-10-09 NOTE — Progress Notes (Signed)
PULMONARY OFFICE FOLLOW UP NOTE  Requesting MD/Service: Derrel Nip  Date of initial consultation: 03/07/16  Reason for consultation: tracheobronchomalacia, DOE  PT PROFILE: 61 y.o. F never smoker with significant lifetime exposure to second hand smoke with multiple medical problems including chronic hypoxic respiratory failure, moderate COPD, mitral valve prolapse and regurgitation, pulm htn by echocardiogram, appearance of tracheomalacia on CT chest previously followed by Dr Raul Del and referred for further evaluation of severe exertional limitation. Initial eval suggested severe mitral regurgitation and impression was that this was a major contributor to her severe DOE  DATA: Echocardiogram 02/01/13: Normal LVEF. MVP with moderate MR, Moderate AI, severe TI. RVSP est 91 mmHg CT chest 07/29/15: Cardiomegaly with left atrial dilatation. Tracheobronchomalacia. Air trapping in the lungs bilaterally, indicative of small airways disease Echocardiogram 06/13/14: Normal LVEF, Severe MR, RVSP est 62 mmHg Spirometry 03/12/15: FVC 1.94 L (73% pred). FEV1 1.19 L (55% pred), FEV1/FVC 61% 6MWT 03/12/15: 225 meters, no desaturation LE venous US 07/29/15: No DVT Echocardiogram 07/31/15: LVEF 60-65%. MV poorly visualized, trivial MR, LA size normal, TV poorly visualized, RVSP not estimated CT chest 03/11/16: Stable changes of tracheobronchomalacia Echocardiogram 04/15/16: Normal LVEF, Severe MR, moderate PAH Mitral valvuloplasty 07/17/16: @ [facility] by Dr Roxy Manns Echocardiogram 09/15/16: LVEF 60-65%. RVSP estimate 54 mmHg   SUBJ::  She is markedly improved after mitral valvuloplasty. In her words "big difference". Denies DOE, CP, fever, purulent sputum, hemoptysis, LE edema and calf tenderness. She is still wearing O2 with sleep and exertion.    OBJ:  Vitals:   10/06/16 1436  BP: (!) 142/90  Pulse: 91  SpO2: 97%   RA   EXAM:  Gen: Flat affect, NAD HEENT: WNL Neck: No JVD noted Lungs: BS full without  wheezes Cardiovascular: Reg, no M Abdomen: Soft, nontender, normal BS Ext: without clubbing, cyanosis, edema Neuro: CNs grossly intact, motor and sensory intact Skin: Limited exam, no lesions noted  DATA:   BMP Latest Ref Rng & Units 09/17/2016 08/15/2016 07/21/2016  Glucose 70 - 99 mg/dL 80 93 103(H)  BUN 6 - 23 mg/dL 18 18 19   Creatinine 0.40 - 1.20 mg/dL 0.87 0.91 0.86  BUN/Creat Ratio 12 - 28 - 20 -  Sodium 135 - 145 mEq/L 141 144 135  Potassium 3.5 - 5.1 mEq/L 4.0 4.1 4.1  Chloride 96 - 112 mEq/L 101 104 100(L)  CO2 19 - 32 mEq/L 31 22 27   Calcium 8.4 - 10.5 mg/dL 8.6 7.4(L) 6.8(L)    CBC Latest Ref Rng & Units 08/13/2016 07/21/2016 07/20/2016  WBC 3.6 - 11.0 K/uL 8.6 10.9(H) 15.1(H)  Hemoglobin 12.0 - 16.0 g/dL 10.4(L) 9.0(L) 10.6(L)  Hematocrit 35.0 - 47.0 % 31.7(L) 28.0(L) 33.4(L)  Platelets 150 - 440 K/uL 225 122(L) 124(L)    CXR (08/04/16): NACPD    IMPRESSION:     ICD-10-CM   1. COPD, moderate ([place]) J44.9 Pulmonary Function Test [facility] Only    Pulse oximetry, overnight    CANCELED: Pulse oximetry, overnight  2. Nocturnal hypoxemia G47.34   3. DOE - dramatically improved after mitral valve repair R06.09   4. Pulmonary hypertension ([facility]) I27.20    DOE markedly improved sine valvuloplasty. Pulmonary hypertension (moderate) persists. It is not clear whether she still requires O2. It is doubtful that she is benefiting from Singulair and Flovent  PLAN:  Stop Singulair and Flovent Continue Anoro inhaler Continue albuterol inhaler as needed Continue oxygen at night for now We will order an overnight oximetry to determine whether she still needs nocturnal oxygen (  keeping in mind that she has pulmonary hypertension - therefore, we will want to be more liberal with supplemental O2)   Follow-up in 4 months with PFTs (lung function measurements) prior to that visit   Merton Border, MD PCCM service Mobile [phone] Pager [phone] 10/09/2016 3:59 AM

## 2016-10-10 ENCOUNTER — Ambulatory Visit
Admission: EM | Admit: 2016-10-10 | Discharge: 2016-10-10 | Disposition: A | Discharge status: Home / Self Care | Payer: Medicare Other | Attending: Family Medicine | Admitting: Family Medicine

## 2016-10-10 DIAGNOSIS — J0100 Acute maxillary sinusitis, unspecified: Secondary | ICD-10-CM

## 2016-10-10 DIAGNOSIS — H6503 Acute serous otitis media, bilateral: Secondary | ICD-10-CM

## 2016-10-10 NOTE — ED Provider Notes (Signed)
[facility]    CSN: [id number] Arrival date & time: 10/10/16  1008     History   Chief Complaint Chief Complaint  Patient presents with  . Otalgia    HPI Lauren Bartlett is a 61 y.o. female.   The history is provided by the patient.  Otalgia  Associated symptoms: congestion   URI  Presenting symptoms: congestion, ear pain, facial pain and fatigue   Severity:  Moderate Onset quality:  Gradual Duration:  7 days Timing:  Constant Progression:  Unchanged Chronicity:  New Relieved by:  Nothing Ineffective treatments: seen yesterday at different urgent care, diagnosed with sinusitis and prescribed doxycycline; has only taken 2 doses. Associated symptoms: sinus pain   Associated symptoms: no wheezing   Risk factors: being elderly and chronic respiratory disease (copd)   Risk factors: no immunosuppression, no recent illness and no recent travel     Past Medical History:  Diagnosis Date  . Anxiety   . Bleeding from the nose   . Chronic diastolic congestive heart failure ([place])    a. 03/2016 Echo: EF 55-60%, Gr2 DD.  [name] Congenital absence of one kidney   . COPD (chronic obstructive pulmonary disease) ()   . Depression    treated at [place]  . Dyspnea   . Emphysema/COPD ([place])   . GERD (gastroesophageal reflux disease)   . History of sciatica   . Hypertension   . Hypothyroidism   . Mitral Valve Prolapse & Severe mitral regurgitation s/p MVR    a. 03/2016 Echo: severe MVP involving the posterior leaflet, sev MR;  b. 06/2016 minimally invasive MV repair w/ triangular resection of flail segment (P2) of posterior leaflet, gore-tex neochord placement x 4, Sorin Memo 3D rin Annulosplasty (1mm, catalog # [id number], ser # [id number]).  . Motion sickness    cars  . Pneumonia   . PONV (postoperative nausea and vomiting)   . Post-op Afib    a. 06/2016 following MV repair-->amio (dose reduced 2/2 QT prolongation), coumadin.  . Pulmonary hypertension ([place])   . S/P patent  foramen ovale closure    a. 06/2016 @ time of MV Repair.  . Sleep apnea    O2 at night and PRN    Patient Active Problem List   Diagnosis Date Noted  . Otitis media 09/20/2016  . Encounter for therapeutic drug monitoring 08/13/2016  . S/P minimally invasive mitral valve repair 07/17/2016  . S/P patent foramen ovale closure 07/17/2016  . Chronic diastolic congestive heart failure ([place])   . Hypoxia   . Influenza B 05/20/2016  . COPD exacerbation ([place]) 05/19/2016  . COPD (chronic obstructive pulmonary disease) ([place]) 05/19/2016  . 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  . Constipation 08/05/2015  . Hospital discharge follow-up 08/05/2015  . Chronic respiratory failure with hypoxia ([place])   . Encounter for preventive health examination 03/14/2015  . Breast cancer screening 03/14/2015  . Need for hepatitis C screening test 03/14/2015  . Vitamin D deficiency 01/11/2015  . S/P hysterectomy with oophorectomy 09/22/2013  . Pulmonary hypertension ([place])   . Hypocalcemia 09/10/2012  . Hypothyroidism 05/04/2012  . Severe mitral regurgitation   . Obesity (BMI 30-39.9) 08/04/2011  . Depression   . Hypertension   . Congenital absence of one kidney   . History of sciatica     Past Surgical History:  Procedure Laterality Date  . ABDOMINAL HYSTERECTOMY    . BREAST BIOPSY Right 2011   [facility]< benign  . CATARACT EXTRACTION W/PHACO Left 11/14/2015   Procedure: CATARACT  EXTRACTION PHACO AND INTRAOCULAR LENS PLACEMENT (IOC);  Surgeon: Leandrew Koyanagi, MD;  Location: [place];  Service: Ophthalmology;  Laterality: Left;  sleep apnea Tor[place]  . CATARACT EXTRACTION W/PHACO Right 12/19/2015   Procedure: CATARACT EXTRACTION PHACO AND INTRAOCULAR LENS PLACEMENT (IOC);  Surgeon: Leandrew Koyanagi, MD;  Location: [facility];  Service: Ophthalmology;  Laterality: Right;  ANXIETY GENEROUS IV SEDATION TORIC LEN  . COMBINED HYSTERECTOMY ABDOMINAL W/ A&P REPAIR / OOPHORECTOMY  1996   benign tumor  .  INNER EAR SURGERY     bilateral  . MITRAL VALVE REPAIR Right 07/17/2016   Procedure: MINIMALLY INVASIVE MITRAL VALVE REPAIR (MVR) USING 26 SORIN MEMO 3D ANNULOPLASTY RING;  Surgeon: Rexene Alberts, MD;  Location: [place];  Service: Open Heart Surgery;  Laterality: Right;  . PATENT FORAMEN OVALE(PFO) CLOSURE N/A 07/17/2016   Procedure: PATENT FORAMEN OVALE (PFO) CLOSURE;  Surgeon: Rexene Alberts, MD;  Location: [place];  Service: Open Heart Surgery;  Laterality: N/A;  . RIGHT/LEFT HEART CATH AND CORONARY ANGIOGRAPHY Bilateral 06/16/2016   Procedure: Right/Left Heart Cath and Coronary Angiography;  Surgeon: Wellington Hampshire, MD;  Location: [facility];  Service: Cardiovascular;  Laterality: Bilateral;  . TEE WITHOUT CARDIOVERSION N/A 05/19/2016   Procedure: TRANSESOPHAGEAL ECHOCARDIOGRAM (TEE);  Surgeon: Wellington Hampshire, MD;  Location: [facility];  Service: Cardiovascular;  Laterality: N/A;  . TEE WITHOUT CARDIOVERSION N/A 06/09/2016   Procedure: TRANSESOPHAGEAL ECHOCARDIOGRAM (TEE);  Surgeon: Wellington Hampshire, MD;  Location: [facility];  Service: Cardiovascular;  Laterality: N/A;  . TEE WITHOUT CARDIOVERSION N/A 06/16/2016   Procedure: TRANSESOPHAGEAL ECHOCARDIOGRAM (TEE);  Surgeon: Wellington Hampshire, MD;  Location: [facility];  Service: Cardiovascular;  Laterality: N/A;  . TEE WITHOUT CARDIOVERSION N/A 07/17/2016   Procedure: TRANSESOPHAGEAL ECHOCARDIOGRAM (TEE);  Surgeon: Rexene Alberts, MD;  Location: [place];  Service: Open Heart Surgery;  Laterality: N/A;  . TONSILLECTOMY      OB History    No data available       Home Medications    Prior to Admission medications   Medication Sig Start Date End Date Taking? Authorizing Provider  acetaminophen (TYLENOL) 500 MG tablet Take 500 mg by mouth 2 (two) times daily as needed for moderate pain or headache.   Yes [provider]  albuterol (PROVENTIL HFA;VENTOLIN HFA) 108 (90 Base) MCG/ACT inhaler Inhale 2 puffs into the lungs every 6 (six)  hours as needed for wheezing or shortness of breath. Pt uses with SPACER.   Yes [provider]  albuterol (PROVENTIL) (2.5 MG/3ML) 0.083% nebulizer solution Take 2.5 mg by nebulization every 6 (six) hours as needed for wheezing or shortness of breath.   Yes [provider]  aspirin EC 81 MG EC tablet Take 1 tablet (81 mg total) by mouth daily. 07/23/16  Yes Barrett, Erin R, PA-C  benzonatate (TESSALON) 200 MG capsule Take 1 capsule (200 mg total) by mouth 3 (three) times daily as needed for cough. 07/04/16  Yes [name], MD  docusate sodium (COLACE) 100 MG capsule TAKE 2 CAPSULES BY MOUTH EVERY DAY 08/15/16  Yes [name], MD  doxycycline (ADOXA) 100 MG tablet Take 100 mg by mouth.   Yes [provider]  ergocalciferol (DRISDOL) 50000 units capsule Take 1 capsule (50,000 Units total) by mouth once a week. 09/26/16  Yes [name], MD  famotidine (PEPCID AC) 10 MG chewable tablet Chew 10 mg by mouth daily as needed for heartburn.   Yes  [provider]  ferrous sulfate 325 (65 FE) MG tablet Take 325 mg by mouth daily with breakfast.   Yes [provider]  fluticasone (FLONASE) 50 MCG/ACT nasal spray Place 2 sprays into both nostrils daily. 08/18/16  Yes [name], MD  furosemide (LASIX) 40 MG tablet TAKE 1 TABLET BY MOUTH EVERY DAY 10/07/16  Yes [name], MD  Hypromellose (ISOPTO TEARS) 0.5 % SOLN Apply 1 drop to eye 2 (two) times daily.   Yes [provider]  ipratropium (ATROVENT) 0.03 % nasal spray USE 2 SPRAYS NASALLY THREE TIMES DAILY 06/23/16  Yes Laverle Hobby, MD  levothyroxine (SYNTHROID, LEVOTHROID) 125 MCG tablet Take 1 tablet (125 mcg total) by mouth daily before breakfast. 08/18/16  Yes [name], MD  loratadine-pseudoephedrine (CLARITIN-D 24-HOUR) 10-240 MG 24 hr tablet Take 1 tablet by mouth daily.   Yes [provider]  LORazepam (ATIVAN) 0.5 MG tablet Take 1 tablet (0.5 mg total) by mouth  every 8 (eight) hours as needed for anxiety. 08/21/16 08/21/17 Yes [name], MD  metoprolol tartrate (LOPRESSOR) 25 MG tablet TABLET ONE-HALF TABLET BY MOUTH TWICE DAILY 10/07/16  Yes [name], MD  OXYGEN Inhale 2 L into the lungs at bedtime as needed (at bedtime and as needed).   Yes [provider]  oxymetazoline (AFRIN) 0.05 % nasal spray Place 1 spray into both nostrils 2 (two) times daily. As needed for nose bleed   Yes [provider]  polyethylene glycol powder (GLYCOLAX/MIRALAX) powder Take 17 g by mouth 2 (two) times daily as needed. Patient taking differently: Take 17 g by mouth daily as needed for mild constipation.  08/03/15  Yes [name], MD  traMADol (ULTRAM) 50 MG tablet Take 1-2 tablets (50-100 mg total) by mouth every 4 (four) hours as needed for moderate pain. 08/21/16  Yes [name], MD  umeclidinium-vilanterol (ANORO ELLIPTA) 62.5-25 MCG/INH AEPB Inhale 1 puff into the lungs daily. 04/28/16  Yes [name], MD  vitamin B-12 (CYANOCOBALAMIN) 1000 MCG tablet Take 1 tablet (1,000 mcg total) by mouth daily. 08/14/16  Yes [name], MD    Family History Family History  Problem Relation Age of Onset  . Multiple sclerosis Mother   . Hypertension Mother   . Coronary artery disease Father   . Heart disease Father   . Hypertension Father   . Heart disease Brother   . Cancer Brother   . Breast cancer Paternal Aunt     Social History Social History  Substance Use Topics  . Smoking status: Never Smoker  . Smokeless tobacco: Never Used  . Alcohol use No     Allergies   Codeine; Levaquin [levofloxacin]; Penicillins; Zithromax [azithromycin]; and Prednisone   Review of Systems Review of Systems  Constitutional: Positive for fatigue.  HENT: Positive for congestion, ear pain and sinus pain.   Respiratory: Negative for wheezing.      Physical Exam Triage Vital Signs ED Triage Vitals  Enc Vitals Group     BP 10/10/16  1043 118/69     Pulse Rate 10/10/16 1043 75     Resp 10/10/16 1043 16     Temp 10/10/16 1043 98 F (36.7 C)     Temp Source 10/10/16 1043 Oral     SpO2 10/10/16 1043 100 %     Weight 10/10/16 1046 222 lb (100.7 kg)     Height 10/10/16 1046 5\' 3"  (1.6 m)     Head  Circumference --      Peak Flow --      Pain Score 10/10/16 1046 8     Pain Loc --      Pain Edu? --      Excl. in [place]? --    No data found.   Updated Vital Signs BP 118/69 (BP Location: Right Arm)   Pulse 75   Temp 98 F (36.7 C) (Oral)   Resp 16   Ht 5\' 3"  (1.6 m)   Wt 222 lb (100.7 kg)   SpO2 100%   BMI 39.33 kg/m   Visual Acuity Right Eye Distance:   Left Eye Distance:   Bilateral Distance:    Right Eye Near:   Left Eye Near:    Bilateral Near:     Physical Exam  Constitutional: She appears well-developed and well-nourished. No distress.  HENT:  Head: Normocephalic and atraumatic.  Right Ear: External ear and ear canal normal. Tympanic membrane is erythematous. A middle ear effusion is present.  Left Ear: External ear and ear canal normal. Tympanic membrane is erythematous and bulging. A middle ear effusion is present.  Nose: Mucosal edema and rhinorrhea present. No nose lacerations, sinus tenderness, nasal deformity, septal deviation or nasal septal hematoma. No epistaxis.  No foreign bodies. Right sinus exhibits maxillary sinus tenderness and frontal sinus tenderness. Left sinus exhibits maxillary sinus tenderness and frontal sinus tenderness.  Mouth/Throat: Uvula is midline, oropharynx is clear and moist and mucous membranes are normal. No oropharyngeal exudate.  Eyes: Pupils are equal, round, and reactive to light. Conjunctivae and EOM are normal. Right eye exhibits no discharge. Left eye exhibits no discharge. No scleral icterus.  Neck: Normal range of motion. Neck supple. No thyromegaly present.  Cardiovascular: Normal rate, regular rhythm and normal heart sounds.   Pulmonary/Chest: Effort normal  and breath sounds normal. No respiratory distress. She has no wheezes. She has no rales.  Lymphadenopathy:    She has no cervical adenopathy.  Skin: She is not diaphoretic.  Nursing note and vitals reviewed.    UC Treatments / Results  Labs (all labs ordered are listed, but only abnormal results are displayed) Labs Reviewed - No data to display  EKG  EKG Interpretation None       Radiology No results found.  Procedures Procedures (including critical care time)  Medications Ordered in UC Medications - No data to display   Initial Impression / Assessment and Plan / UC Course  I have reviewed the triage vital signs and the nursing notes.  Pertinent labs & imaging results that were available during my care of the patient were reviewed by me and considered in my medical decision making (see chart for details).       Final Clinical Impressions(s) / UC Diagnoses   Final diagnoses:  Bilateral acute serous otitis media, recurrence not specified  Acute maxillary sinusitis, recurrence not specified    New Prescriptions Discharge Medication List as of 10/10/2016 11:24 AM     1. diagnosis reviewed with patient 2. Recommend continue current antibiotic prescribed yesterday 3. Recommend supportive treatment with otc flonase 4. Follow-up prn if symptoms worsen or don't improve   Norval Gable, MD 10/10/16 1132

## 2016-10-10 NOTE — ED Triage Notes (Signed)
61 year old Caucasian female is her today with complaints of bilateral ear pain and dizziness that started about a week ago. She states she was seen at the Urgent Care in [facility] yesterday. She states she was diagnosed with sinusitis and advised she did have fluid in both ears. She states she was prescribed Doxycycline 100 mg BID for 7 days. She states she would like a second opinion.

## 2016-10-13 DIAGNOSIS — J0140 Acute pansinusitis, unspecified: Secondary | ICD-10-CM | POA: Diagnosis not present

## 2016-10-13 DIAGNOSIS — R42 Dizziness and giddiness: Secondary | ICD-10-CM | POA: Diagnosis not present

## 2016-10-14 DIAGNOSIS — R42 Dizziness and giddiness: Secondary | ICD-10-CM | POA: Diagnosis not present

## 2016-10-14 DIAGNOSIS — R2681 Unsteadiness on feet: Secondary | ICD-10-CM | POA: Diagnosis not present

## 2016-10-15 ENCOUNTER — Other Ambulatory Visit: Payer: Self-pay | Admitting: *Deleted

## 2016-10-15 NOTE — Patient Outreach (Signed)
Unsuccessful telephone encounter to pt, attempt to check on clinical status  as this RN CM followed pt for transition of care (SNF discharge 08/11/16, admitted to rehab after hospitalization  07/17/16 to 07/23/16 for mitral valve repair, chronic diastolic CHF, COPD exacerbation).  Transition of care program completed 09/15/16.   HIPAA  Compliant voice message left with contact name and number.  Plan:  If no response to voice message left, plan to follow up again telephonically within next 2 days.    Zara Chess.   [facility]  [phone]

## 2016-10-16 ENCOUNTER — Other Ambulatory Visit: Payer: Self-pay | Admitting: *Deleted

## 2016-10-16 ENCOUNTER — Encounter: Payer: Self-pay | Admitting: *Deleted

## 2016-10-16 NOTE — Patient Outreach (Signed)
Successful telephone encounter to Lauren Bartlett 61 year old female, followed for transition of care/recent SNF discharage 08/11/16, admitted to rehab after hospitalization 07/17/16 to 07/23/16 for mitral valve repair, chronic diastolic CHF, COPD exacerbation, transition of care program completed 09/15/16.   Spoke with pt, HIPAA verified, discussed purpose of call, check on status/recent ED visit (view in Epic 10/10/16).  Pt reports has a sinus infection, been going on for 2-3 weeks, was on wrong antibiotic/not helping, started taking Clarithromycin 500 mg bid 10/13/16- 14 day prescription, feeling better.   Pt reports has a little ear pain, ears keep popping.   Pt reports antibiotic is making her sleepy, drunk headed, not been exercising/gaining weight, today's  weight 222 lbs.  Pt reports no sob, weight gain is not >3 lbs in a day, 5 lbs in a week- feel weight gain coming from sitting around with sinus infection.  Pt reports saw PCP 3 weeks ago - good report, to see Heart surgeon 10/20/2016.  Pt reports also saw Lung MD- informed to do an overnight test at home to see if she still needs oxygen as recent visit MD informed her lungs look good/clear.    Pt reports everything is good except for this sinus infection, was told it was bad, having small amount of drainage- clear.   RN CM reinforced with pt importance of completing antibiotic/staying hydrated,  call MD for increased pain, drainage color change to which pt voiced understanding.  Discussed with pt plan to close her case, no case management needs to which pt agreed.   Plan:  As discussed with pt, plan to discharge from [facility] nurse case management services- goals met.            Plan to inform Dr. Derrel Nip of discharge, send by in basket case closure letter.            Plan to inform [facility] care management assistant to close case.    Zara Chess.   [facility]  [phone]

## 2016-10-20 ENCOUNTER — Ambulatory Visit (INDEPENDENT_AMBULATORY_CARE_PROVIDER_SITE_OTHER): Payer: Medicare Other | Admitting: Thoracic Surgery (Cardiothoracic Vascular Surgery)

## 2016-10-20 ENCOUNTER — Encounter: Payer: Self-pay | Admitting: Emergency Medicine

## 2016-10-20 ENCOUNTER — Emergency Department
Admission: EM | Admit: 2016-10-20 | Discharge: 2016-10-20 | Disposition: A | Discharge status: Home / Self Care | Payer: Medicare Other | Attending: Emergency Medicine | Admitting: Emergency Medicine

## 2016-10-20 ENCOUNTER — Encounter: Payer: Self-pay | Admitting: Thoracic Surgery (Cardiothoracic Vascular Surgery)

## 2016-10-20 VITALS — BP 136/87 | HR 89 | Resp 20 | Ht 62.0 in | Wt 218.0 lb

## 2016-10-20 DIAGNOSIS — I5032 Chronic diastolic (congestive) heart failure: Secondary | ICD-10-CM | POA: Insufficient documentation

## 2016-10-20 DIAGNOSIS — Z7982 Long term (current) use of aspirin: Secondary | ICD-10-CM | POA: Insufficient documentation

## 2016-10-20 DIAGNOSIS — I340 Nonrheumatic mitral (valve) insufficiency: Secondary | ICD-10-CM

## 2016-10-20 DIAGNOSIS — Z9889 Other specified postprocedural states: Secondary | ICD-10-CM

## 2016-10-20 DIAGNOSIS — Z79899 Other long term (current) drug therapy: Secondary | ICD-10-CM | POA: Insufficient documentation

## 2016-10-20 DIAGNOSIS — J449 Chronic obstructive pulmonary disease, unspecified: Secondary | ICD-10-CM | POA: Insufficient documentation

## 2016-10-20 DIAGNOSIS — R42 Dizziness and giddiness: Secondary | ICD-10-CM | POA: Diagnosis not present

## 2016-10-20 DIAGNOSIS — I110 Hypertensive heart disease with heart failure: Secondary | ICD-10-CM | POA: Insufficient documentation

## 2016-10-20 DIAGNOSIS — E039 Hypothyroidism, unspecified: Secondary | ICD-10-CM | POA: Diagnosis not present

## 2016-10-20 LAB — CBC
HEMATOCRIT: 42.3 % (ref 35.0–47.0)
Hemoglobin: 13.7 g/dL (ref 12.0–16.0)
MCH: 28.2 pg (ref 26.0–34.0)
MCHC: 32.3 g/dL (ref 32.0–36.0)
MCV: 87.2 fL (ref 80.0–100.0)
Platelets: 200 10*3/uL (ref 150–440)
RBC: 4.85 MIL/uL (ref 3.80–5.20)
RDW: 14.9 % — AB (ref 11.5–14.5)
WBC: 9.5 10*3/uL (ref 3.6–11.0)

## 2016-10-20 LAB — URINALYSIS, ROUTINE W REFLEX MICROSCOPIC
BILIRUBIN URINE: NEGATIVE
Glucose, UA: NEGATIVE mg/dL
HGB URINE DIPSTICK: NEGATIVE
Ketones, ur: NEGATIVE mg/dL
Leukocytes, UA: NEGATIVE
Nitrite: NEGATIVE
PROTEIN: NEGATIVE mg/dL
Specific Gravity, Urine: 1.011 (ref 1.005–1.030)
pH: 6 (ref 5.0–8.0)

## 2016-10-20 LAB — COMPREHENSIVE METABOLIC PANEL
ALT: 15 U/L (ref 14–54)
AST: 18 U/L (ref 15–41)
Albumin: 4.1 g/dL (ref 3.5–5.0)
Alkaline Phosphatase: 78 U/L (ref 38–126)
Anion gap: 10 (ref 5–15)
BILIRUBIN TOTAL: 0.4 mg/dL (ref 0.3–1.2)
BUN: 27 mg/dL — AB (ref 6–20)
CO2: 29 mmol/L (ref 22–32)
CREATININE: 0.92 mg/dL (ref 0.44–1.00)
Calcium: 8.3 mg/dL — ABNORMAL LOW (ref 8.9–10.3)
Chloride: 101 mmol/L (ref 101–111)
GFR calc Af Amer: >60 mL/min (ref >60)
Glucose, Bld: 105 mg/dL — ABNORMAL HIGH (ref 65–99)
Potassium: 4.2 mmol/L (ref 3.5–5.1)
Sodium: 140 mmol/L (ref 135–145)
TOTAL PROTEIN: 7.4 g/dL (ref 6.5–8.1)

## 2016-10-20 NOTE — Patient Instructions (Signed)

## 2016-10-20 NOTE — ED Notes (Signed)
Pt assisted to BR to void, qs; pt ambulates with aid of cane, no difficulty noted but c/o dizziness upon standing; pt updated on wait time & voices understanding

## 2016-10-20 NOTE — Progress Notes (Signed)
[address]       [place] [address]             [phone]     CARDIOTHORACIC SURGERY OFFICE NOTE  Referring Provider is Wellington Hampshire, MD PCP is [name], MD   HPI:  Patient is a 61 year old obese female with history of mitral regurgitation caused by mitral valve prolapse, chronic respiratory failure that had previously been attributed to COPD, essential hypertension, and anxiety and depression who returns to the office today for routine follow-up status post minimally invasive mitral valve repair and closure of patent foramen ovale on 07/17/2016 for mitral valve prolapse with severe symptomatic primary mitral regurgitation.  The patient's early postoperative recovery in the hospital was essentially uncomplicated although notable for the development of postoperative atrial fibrillation for which she was treated with amiodarone. She was discharged home on the sixth postoperative day. Since hospital discharge she has done well from a cardiac standpoint.  She was last seen in our office on 08/04/2016 at which time she was making good progress. Since then she has been seen in follow-up by Ignacia Bayley at [facility] on 08/15/2016. Routine follow-up echocardiogram performed 09/15/2016 revealed normal left ventricular systolic function was intact mitral valve repair and no residual mitral regurgitation. The patient has been maintaining sinus rhythm and was taken off of Coumadin because of nosebleeds.  She returns to our office today for routine follow-up. She reports that her breathing is dramatically better than it was prior to surgery. She has not been using her inhalers and she has been taken off of most of her pulmonary medications.  Recently she has been having some problems with sinusitis and otitis for which she is being treated with clindamycin. She otherwise feels quite well and reports that she is walking much more in doing much better with markedly  improved exercise tolerance.    Current Outpatient Prescriptions  Medication Sig Dispense Refill  . acetaminophen (TYLENOL) 500 MG tablet Take 500 mg by mouth 2 (two) times daily as needed for moderate pain or headache.    . albuterol (PROVENTIL HFA;VENTOLIN HFA) 108 (90 Base) MCG/ACT inhaler Inhale 2 puffs into the lungs every 6 (six) hours as needed for wheezing or shortness of breath. Pt uses with SPACER.    [name] albuterol (PROVENTIL) (2.5 MG/3ML) 0.083% nebulizer solution Take 2.5 mg by nebulization every 6 (six) hours as needed for wheezing or shortness of breath.    [name] aspirin EC 81 MG EC tablet Take 1 tablet (81 mg total) by mouth daily.    . benzonatate (TESSALON) 200 MG capsule Take 1 capsule (200 mg total) by mouth 3 (three) times daily as needed for cough. 60 capsule 1  . clarithromycin (BIAXIN) 500 MG tablet Take 500 mg by mouth 2 (two) times daily.    [name] docusate sodium (COLACE) 100 MG capsule TAKE 2 CAPSULES BY MOUTH EVERY DAY 60 capsule 5  . doxycycline (ADOXA) 100 MG tablet Take 100 mg by mouth.    . ergocalciferol (DRISDOL) 50000 units capsule Take 1 capsule (50,000 Units total) by mouth once a week. 4 capsule 11  . famotidine (PEPCID AC) 10 MG chewable tablet Chew 10 mg by mouth daily as needed for heartburn.    . ferrous sulfate 325 (65 FE) MG tablet Take 325 mg by mouth daily with breakfast.    . fluticasone (FLONASE) 50 MCG/ACT nasal spray Place 2 sprays into both nostrils daily. 16 g 2  .  furosemide (LASIX) 40 MG tablet TAKE 1 TABLET BY MOUTH EVERY DAY 30 tablet 2  . Hypromellose (ISOPTO TEARS) 0.5 % SOLN Apply 1 drop to eye 2 (two) times daily.    [name] ipratropium (ATROVENT) 0.03 % nasal spray USE 2 SPRAYS NASALLY THREE TIMES DAILY 90 mL 1  . levothyroxine (SYNTHROID, LEVOTHROID) 125 MCG tablet Take 1 tablet (125 mcg total) by mouth daily before breakfast. 30 tablet 5  . loratadine-pseudoephedrine (CLARITIN-D 24-HOUR) 10-240 MG 24 hr tablet Take 1 tablet by mouth daily.    [name]  LORazepam (ATIVAN) 0.5 MG tablet Take 1 tablet (0.5 mg total) by mouth every 8 (eight) hours as needed for anxiety. 30 tablet 0  . metoprolol tartrate (LOPRESSOR) 25 MG tablet TABLET ONE-HALF TABLET BY MOUTH TWICE DAILY 60 tablet 2  . OXYGEN Inhale 2 L into the lungs at bedtime as needed (at bedtime and as needed).    [name] oxymetazoline (AFRIN) 0.05 % nasal spray Place 1 spray into both nostrils 2 (two) times daily. As needed for nose bleed    . polyethylene glycol powder (GLYCOLAX/MIRALAX) powder Take 17 g by mouth 2 (two) times daily as needed. (Patient taking differently: Take 17 g by mouth daily as needed for mild constipation. ) 3350 g 1  . traMADol (ULTRAM) 50 MG tablet Take 1-2 tablets (50-100 mg total) by mouth every 4 (four) hours as needed for moderate pain. 30 tablet 0  . umeclidinium-vilanterol (ANORO ELLIPTA) 62.5-25 MCG/INH AEPB Inhale 1 puff into the lungs daily. 1 each 11  . vitamin B-12 (CYANOCOBALAMIN) 1000 MCG tablet Take 1 tablet (1,000 mcg total) by mouth daily. 90 tablet 3   No current facility-administered medications for this visit.       Physical Exam:   BP 136/87   Pulse 89   Resp 20   Ht 5\' 2"  (1.575 m)   Wt 218 lb (98.9 kg)   SpO2 99% Comment: on RA  BMI 39.87 kg/m   General:  Well-appearing  Chest:   Clear to auscultation with symmetrical breath sounds  CV:   Regular rate and rhythm without murmur  Incisions:  Completely healed  Abdomen:  Soft nontender  Extremities:  Warm and well-perfused, no edema  Diagnostic Tests:  Transthoracic Echocardiography  Patient:    Lauren Bartlett, Lauren Bartlett MR #:       [id number] Study Date: 09/15/2016 Gender:     F Age:        60 Height:     157.5 cm Weight:     98.5 kg BSA:        2.13 m^2 Pt. Status: Room:   ATTENDING    Default, Provider [phone]  [place], MD  Graham, MD  SONOGRAPHER  [facility]  PERFORMING   [facility],  [place]  cc:  ------------------------------------------------------------------- LV EF: 60% -   65%  ------------------------------------------------------------------- History:   PMH:   Congestive heart failure.  Mitral stenosis. Mitral valve disease.  Primary pulmonary hypertension. OSA.  Risk factors:  Lifelong nonsmoker. Hypertension. Obese. 06/2016 minimally invasive MV repair w/ triangular resection of flail segment (P2) of posterior leaflet, gore-tex neochord placement x 4, Sorin Memo 3D rin Annulosplasty (76mm, catalog # [id number], ser # [id number]).  ------------------------------------------------------------------- Study Conclusions  - Left ventricle: The cavity size was normal. Systolic function was   normal. The estimated ejection fraction was in the range of 60%   to 65%. Wall motion was normal; there were no  regional wall   motion abnormalities. Left ventricular diastolic function   parameters were normal. - Aortic valve: There was trivial regurgitation. - Mitral valve: Prior procedures included surgical repair. Valve   area by pressure half-time: 2.06 cm^2. - Left atrium: The atrium was normal in size. - Right ventricle: Systolic function was normal. - Tricuspid valve: There was mild-moderate regurgitation. - Pulmonary arteries: Systolic pressure was moderately elevated. PA   peak pressure: 54 mm Hg (S).  ------------------------------------------------------------------- Labs, prior tests, procedures, and surgery: Echocardiography (January 2018).     EF was 60%.  Transesophageal echocardiography.    The mitral valve showed severe regurgitation.  EF was 55%. PFO.  ------------------------------------------------------------------- Study data:  Comparison was made to the study of January 2018. Study status:  Routine.  Procedure:  The patient reported no pain pre or post test. Transthoracic echocardiography. Image quality was adequate.  Study completion:   There were no complications. Transthoracic echocardiography.  M-mode, complete 2D, spectral Doppler, and color Doppler.  Birthdate:  Patient birthdate: [birth year].  Age:  Patient is 61 yr old.  Sex:  Gender: female. BMI: 39.7 kg/m^2.  Blood pressure:     104/64  Patient status: Outpatient.  Study date:  Study date: 09/15/2016. Study time: 03:29 PM.  -------------------------------------------------------------------  ------------------------------------------------------------------- Left ventricle:  The cavity size was normal. Systolic function was normal. The estimated ejection fraction was in the range of 60% to 65%. Wall motion was normal; there were no regional wall motion abnormalities. The transmitral flow pattern was normal. The deceleration time of the early transmitral flow velocity was normal. The pulmonary vein flow pattern was normal. The tissue Doppler parameters were normal. Left ventricular diastolic function parameters were normal.  ------------------------------------------------------------------- Aortic valve:  Poorly visualized.  Trileaflet; normal thickness leaflets. Mobility was not restricted.  Doppler:  Transvalvular velocity was within the normal range. There was no stenosis. There was trivial regurgitation.  ------------------------------------------------------------------- Aorta:  Aortic root: The aortic root was normal in size.  ------------------------------------------------------------------- Mitral valve:  Prior procedures included surgical repair. Mobility was not restricted.  Doppler:  Transvalvular velocity consistent with MV repair. There was no regurgitation.    Valve area by pressure half-time: 2.06 cm^2. Indexed valve area by pressure half-time: 0.97 cm^2/m^2. Valve area by continuity equation (using LVOT flow): 1.04 cm^2. Indexed valve area by continuity equation (using LVOT flow): 0.49 cm^2/m^2.    Mean gradient (D): 8 mm Hg. Peak  gradient (D): 12 mm Hg.  ------------------------------------------------------------------- Left atrium:  The atrium was normal in size.  ------------------------------------------------------------------- Right ventricle:  The cavity size was normal. Wall thickness was normal. Systolic function was normal.  ------------------------------------------------------------------- Pulmonic valve:    Structurally normal valve.   Cusp separation was normal.  Doppler:  Transvalvular velocity was within the normal range. There was no evidence for stenosis. There was no regurgitation.  ------------------------------------------------------------------- Tricuspid valve:   Structurally normal valve.    Doppler: Transvalvular velocity was within the normal range. There was mild-moderate regurgitation.  ------------------------------------------------------------------- Pulmonary artery:   The main pulmonary artery was normal-sized. Systolic pressure was moderately elevated.  ------------------------------------------------------------------- Right atrium:  The atrium was normal in size.  ------------------------------------------------------------------- Pericardium:  There was no pericardial effusion.  ------------------------------------------------------------------- Systemic veins: Inferior vena cava: The vessel was normal in size.  ------------------------------------------------------------------- Measurements   Left ventricle                           Value  Reference  LV ID, ED, PLAX chordal                  50    mm       43 - 52  LV ID, ES, PLAX chordal                  32    mm       23 - 38  LV fx shortening, PLAX chordal           36    %        >=29  LV PW thickness, ED                      12    mm       ----------  IVS/LV PW ratio, ED                      0.92           <=1.3  Stroke volume, 2D                        62    ml       ----------  Stroke  volume/bsa, 2D                    29    ml/m^2   ----------  LV ejection fraction, 1-p A4C            72    %        ----------  LV end-diastolic volume, 2-p             65    ml       ----------  LV end-systolic volume, 2-p              23    ml       ----------  LV ejection fraction, 2-p                64    %        ----------  Stroke volume, 2-p                       41    ml       ----------  LV end-diastolic volume/bsa, 2-p         30    ml/m^2   ----------  LV end-systolic volume/bsa, 2-p          11    ml/m^2   ----------  Stroke volume/bsa, 2-p                   19.4  ml/m^2   ----------  LV e&', lateral                           9.79  cm/s     ----------  LV E/e&', lateral                         17.36          ----------  LV e&', medial                            8.05  cm/s     ----------  LV  E/e&', medial                          21.12          ----------  LV e&', average                           8.92  cm/s     ----------  LV E/e&', average                         19.06          ----------    Ventricular septum                       Value          Reference  IVS thickness, ED                        11    mm       ----------    LVOT                                     Value          Reference  LVOT ID, S                               19    mm       ----------  LVOT area                                2.84  cm^2     ----------  LVOT mean velocity, S                    71.6  cm/s     ----------  LVOT VTI, S                              21.8  cm       ----------    Aortic valve                             Value          Reference  Aortic regurg pressure half-time         797   ms       ----------    Aorta                                    Value          Reference  Aortic root ID, ED                       32    mm       ----------    Left atrium                              Value  Reference  LA ID, A-P, ES                           32    mm       ----------  LA  ID/bsa, A-P                           1.5   cm/m^2   <=2.2  LA volume, S                             71.6  ml       ----------  LA volume/bsa, S                         33.6  ml/m^2   ----------  LA volume, ES, 1-p A4C                   69.9  ml       ----------  LA volume/bsa, ES, 1-p A4C               32.8  ml/m^2   ----------  LA volume, ES, 1-p A2C                   69.6  ml       ----------  LA volume/bsa, ES, 1-p A2C               32.7  ml/m^2   ----------    Mitral valve                             Value          Reference  Mitral E-wave peak velocity              170   cm/s     ----------  Mitral A-wave peak velocity              174   cm/s     ----------  Mitral mean velocity, D                  132   cm/s     ----------  Mitral deceleration time         (H)     267   ms       150 - 230  Mitral pressure half-time                107   ms       ----------  Mitral mean gradient, D                  8     mm Hg    ----------  Mitral peak gradient, D                  12    mm Hg    ----------  Mitral E/A ratio, peak                   1              ----------  Mitral valve area, PHT, DP               2.06  cm^2     ----------  Mitral  valve area/bsa, PHT, DP           0.97  cm^2/m^2 ----------  Mitral valve area, LVOT                  1.04  cm^2     ----------  continuity  Mitral valve area/bsa, LVOT              0.49  cm^2/m^2 ----------  continuity  Mitral annulus VTI, D                    59.3  cm       ----------  Mitral maximal regurg velocity,          505   cm/s     ----------  PISA    Pulmonary arteries                       Value          Reference  PA pressure, S, DP               (H)     54    mm Hg    <=30    Tricuspid valve                          Value          Reference  Tricuspid regurg peak velocity           338   cm/s     ----------  Tricuspid peak RV-RA gradient            46    mm Hg    ----------    Right atrium                             Value           Reference  RA ID, S-I, ES, A4C                      40.6  mm       34 - 49  RA area, ES, A4C                         15.1  cm^2     8.3 - 19.5  RA volume, ES, A/L                       45    ml       ----------  RA volume/bsa, ES, A/L                   21.1  ml/m^2   ----------    Systemic veins                           Value          Reference  Estimated CVP                            8     mm Hg    ----------    Right ventricle  Value          Reference  TAPSE                                    19    mm       ----------  RV pressure, S, DP               (H)     54    mm Hg    <=30  RV s&', lateral, S                        12.2  cm/s     ----------  Legend: (L)  and  (H)  mark values outside specified reference range.  ------------------------------------------------------------------- Prepared and Electronically Authenticated by  Esmond Plants, MD, [facility] 2018-06-21T18:15:49   Impression:  Patient is doing well approximately 3 months status post minimally invasive mitral valve repair  Plan:  We have not recommended any changes to the patient's current medications. The patient has been encouraged to continue to increase her activity without any particular limitations.  The patient has been reminded regarding the importance of dental hygiene and the lifelong need for antibiotic prophylaxis for all dental cleanings and other related invasive procedures.  She will continue to follow-up with [name] on a regular basis. She will return to our office next April for routine follow-up approximately 1 year following surgery. She will call and return sooner should specific problems or questions arise.  I spent in excess of 15 minutes during the conduct of this office consultation and >50% of this time involved direct face-to-face encounter with the patient for counseling and/or coordination of their care.    Valentina Gu. Roxy Manns, MD 10/20/2016 1:19 PM

## 2016-10-20 NOTE — ED Provider Notes (Signed)
[facility] Emergency Department Provider Note  Time seen: 8:52 PM  I have reviewed the triage vital signs and the nursing notes.   HISTORY  Chief Complaint Dizziness    HPI Lauren Bartlett is a 61 y.o. female with a past medical history of COPD, depression, gastric reflux, hypertension, anxiety, presents to the emergency department for nasal congestion and dizziness. According to the patient for the past 2 weeks she has been experiencing nasal congestion and dizziness. She saw her doctor 1 week ago who prescribed her antibiotics for sinusitis. She states she continues to have some dizziness. She states today for approximately 5 minutes she felt like her right side was paralyzed, but then states that went away. She called EMS to bring her to the emergency department. Here the patient denies any numbness or weakness, headache confusion or slurred speech. She continues to state intermittent dizziness although denies any currently. Denies any fever. Continues to state fullness in her ears but denies any pain in her ears.  Past Medical History:  Diagnosis Date  . Anxiety   . Bleeding from the nose   . Chronic diastolic congestive heart failure ([place])    a. 03/2016 Echo: EF 55-60%, Gr2 DD.  [name] Congenital absence of one kidney   . COPD (chronic obstructive pulmonary disease) ([place])   . Depression    treated at [place]  . Dyspnea   . Emphysema/COPD ([place])   . GERD (gastroesophageal reflux disease)   . History of sciatica   . Hypertension   . Hypothyroidism   . Mitral Valve Prolapse & Severe mitral regurgitation s/p MVR    a. 03/2016 Echo: severe MVP involving the posterior leaflet, sev MR;  b. 06/2016 minimally invasive MV repair w/ triangular resection of flail segment (P2) of posterior leaflet, gore-tex neochord placement x 4, Sorin Memo 3D rin Annulosplasty (27mm, catalog # [id number], ser # [id number]).  . Motion sickness    cars  . Pneumonia   . PONV (postoperative  nausea and vomiting)   . Post-op Afib    a. 06/2016 following MV repair-->amio (dose reduced 2/2 QT prolongation), coumadin.  . Pulmonary hypertension ([place])   . S/P patent foramen ovale closure    a. 06/2016 @ time of MV Repair.  . Sleep apnea    O2 at night and PRN    Patient Active Problem List   Diagnosis Date Noted  . Otitis media 09/20/2016  . Encounter for therapeutic drug monitoring 08/13/2016  . S/P minimally invasive mitral valve repair 07/17/2016  . S/P patent foramen ovale closure 07/17/2016  . Chronic diastolic congestive heart failure ([place])   . Hypoxia   . Influenza B 05/20/2016  . COPD exacerbation ([place]) 05/19/2016  . COPD (chronic obstructive pulmonary disease) ([place]) 05/19/2016  . 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  . Constipation 08/05/2015  . Hospital discharge follow-up 08/05/2015  . Chronic respiratory failure with hypoxia ([place])   . Encounter for preventive health examination 03/14/2015  . Breast cancer screening 03/14/2015  . Need for hepatitis C screening test 03/14/2015  . Vitamin D deficiency 01/11/2015  . S/P hysterectomy with oophorectomy 09/22/2013  . Pulmonary hypertension ([place])   . Hypocalcemia 09/10/2012  . Hypothyroidism 05/04/2012  . Severe mitral regurgitation   . Obesity (BMI 30-39.9) 08/04/2011  . Depression   . Hypertension   . Congenital absence of one kidney   . History of sciatica     Past Surgical History:  Procedure Laterality Date  . ABDOMINAL HYSTERECTOMY    .  BREAST BIOPSY Right 2011   [facility]< benign  . CATARACT EXTRACTION W/PHACO Left 11/14/2015   Procedure: CATARACT EXTRACTION PHACO AND INTRAOCULAR LENS PLACEMENT (IOC);  Surgeon: Leandrew Koyanagi, MD;  Location: [facility];  Service: Ophthalmology;  Laterality: Left;  sleep apnea [place]  . CATARACT EXTRACTION W/PHACO Right 12/19/2015   Procedure: CATARACT EXTRACTION PHACO AND INTRAOCULAR LENS PLACEMENT (IOC);  Surgeon: Leandrew Koyanagi, MD;  Location: [facility];   Service: Ophthalmology;  Laterality: Right;  ANXIETY GENEROUS IV SEDATION TORIC LEN  . COMBINED HYSTERECTOMY ABDOMINAL W/ A&P REPAIR / OOPHORECTOMY  1996   benign tumor  . INNER EAR SURGERY     bilateral  . MITRAL VALVE REPAIR Right 07/17/2016   Procedure: MINIMALLY INVASIVE MITRAL VALVE REPAIR (MVR) USING 26 SORIN MEMO 3D ANNULOPLASTY RING;  Surgeon: Rexene Alberts, MD;  Location: [facility];  Service: Open Heart Surgery;  Laterality: Right;  . PATENT FORAMEN OVALE(PFO) CLOSURE N/A 07/17/2016   Procedure: PATENT FORAMEN OVALE (PFO) CLOSURE;  Surgeon: Rexene Alberts, MD;  Location: [place];  Service: Open Heart Surgery;  Laterality: N/A;  . RIGHT/LEFT HEART CATH AND CORONARY ANGIOGRAPHY Bilateral 06/16/2016   Procedure: Right/Left Heart Cath and Coronary Angiography;  Surgeon: Wellington Hampshire, MD;  Location: [facility];  Service: Cardiovascular;  Laterality: Bilateral;  . TEE WITHOUT CARDIOVERSION N/A 05/19/2016   Procedure: TRANSESOPHAGEAL ECHOCARDIOGRAM (TEE);  Surgeon: Wellington Hampshire, MD;  Location: [facility];  Service: Cardiovascular;  Laterality: N/A;  . TEE WITHOUT CARDIOVERSION N/A 06/09/2016   Procedure: TRANSESOPHAGEAL ECHOCARDIOGRAM (TEE);  Surgeon: Wellington Hampshire, MD;  Location: [facility];  Service: Cardiovascular;  Laterality: N/A;  . TEE WITHOUT CARDIOVERSION N/A 06/16/2016   Procedure: TRANSESOPHAGEAL ECHOCARDIOGRAM (TEE);  Surgeon: Wellington Hampshire, MD;  Location: [facility];  Service: Cardiovascular;  Laterality: N/A;  . TEE WITHOUT CARDIOVERSION N/A 07/17/2016   Procedure: TRANSESOPHAGEAL ECHOCARDIOGRAM (TEE);  Surgeon: Rexene Alberts, MD;  Location: [facility];  Service: Open Heart Surgery;  Laterality: N/A;  . TONSILLECTOMY      Prior to Admission medications   Medication Sig Start Date End Date Taking? Authorizing Provider  acetaminophen (TYLENOL) 500 MG tablet Take 500 mg by mouth 2 (two) times daily as needed for moderate pain or headache.    [provider]   albuterol (PROVENTIL HFA;VENTOLIN HFA) 108 (90 Base) MCG/ACT inhaler Inhale 2 puffs into the lungs every 6 (six) hours as needed for wheezing or shortness of breath. Pt uses with SPACER.    [provider]  albuterol (PROVENTIL) (2.5 MG/3ML) 0.083% nebulizer solution Take 2.5 mg by nebulization every 6 (six) hours as needed for wheezing or shortness of breath.    [provider]  aspirin EC 81 MG EC tablet Take 1 tablet (81 mg total) by mouth daily. 07/23/16   Barrett, Erin R, PA-C  benzonatate (TESSALON) 200 MG capsule Take 1 capsule (200 mg total) by mouth 3 (three) times daily as needed for cough. 07/04/16   [name], MD  clarithromycin (BIAXIN) 500 MG tablet Take 500 mg by mouth 2 (two) times daily. 10/13/16 10/27/16  [provider]  docusate sodium (COLACE) 100 MG capsule TAKE 2 CAPSULES BY MOUTH EVERY DAY 08/15/16   [name], MD  doxycycline (ADOXA) 100 MG tablet Take 100 mg by mouth.    [provider]  ergocalciferol (DRISDOL) 50000 units capsule Take 1 capsule (50,000 Units total) by mouth once a week. 09/26/16   [name], MD  famotidine (PEPCID AC) 10 MG chewable tablet Chew 10 mg by mouth daily as needed for heartburn.    [provider]  ferrous sulfate 325 (65 FE) MG tablet Take 325 mg by mouth daily with breakfast.    [provider]  fluticasone (FLONASE) 50 MCG/ACT nasal spray Place 2 sprays into both nostrils daily. 08/18/16   [name], MD  furosemide (LASIX) 40 MG tablet TAKE 1 TABLET BY MOUTH EVERY DAY 10/07/16   [name], MD  Hypromellose (ISOPTO TEARS) 0.5 % SOLN Apply 1 drop to eye 2 (two) times daily.    [provider]  ipratropium (ATROVENT) 0.03 % nasal spray USE 2 SPRAYS NASALLY THREE TIMES DAILY 06/23/16   Laverle Hobby, MD  levothyroxine (SYNTHROID, LEVOTHROID) 125 MCG tablet Take 1 tablet (125 mcg total) by mouth daily before breakfast. 08/18/16   [name], MD   loratadine-pseudoephedrine (CLARITIN-D 24-HOUR) 10-240 MG 24 hr tablet Take 1 tablet by mouth daily.    [provider]  LORazepam (ATIVAN) 0.5 MG tablet Take 1 tablet (0.5 mg total) by mouth every 8 (eight) hours as needed for anxiety. 08/21/16 08/21/17  [name], MD  metoprolol tartrate (LOPRESSOR) 25 MG tablet TABLET ONE-HALF TABLET BY MOUTH TWICE DAILY 10/07/16   [name], MD  OXYGEN Inhale 2 L into the lungs at bedtime as needed (at bedtime and as needed).    [provider]  oxymetazoline (AFRIN) 0.05 % nasal spray Place 1 spray into both nostrils 2 (two) times daily. As needed for nose bleed    [provider]  polyethylene glycol powder (GLYCOLAX/MIRALAX) powder Take 17 g by mouth 2 (two) times daily as needed. Patient taking differently: Take 17 g by mouth daily as needed for mild constipation.  08/03/15   [name], MD  traMADol (ULTRAM) 50 MG tablet Take 1-2 tablets (50-100 mg total) by mouth every 4 (four) hours as needed for moderate pain. 08/21/16   [name], MD  umeclidinium-vilanterol (ANORO ELLIPTA) 62.5-25 MCG/INH AEPB Inhale 1 puff into the lungs daily. 04/28/16   [name], MD  vitamin B-12 (CYANOCOBALAMIN) 1000 MCG tablet Take 1 tablet (1,000 mcg total) by mouth daily. 08/14/16   [name], MD    Allergies  Allergen Reactions  . Codeine Hives and Nausea And Vomiting  . Levaquin [Levofloxacin] Nausea And Vomiting  . Penicillins Hives, Itching and Other (See Comments)    Has patient had a PCN reaction causing immediate rash, facial/tongue/throat swelling, SOB or lightheadedness with hypotension: No Has patient had a PCN reaction causing severe rash involving mucus membranes or skin necrosis: No Has patient had a PCN reaction that required hospitalization No Has patient had a PCN reaction occurring within the last 10 years: No If all of the above answers are "NO", then may proceed with Cephalosporin use.  [name] Zithromax  [Azithromycin] Other (See Comments)    Reaction:  Hallucinations   . Prednisone Palpitations and Other (See Comments)    Reaction:  Hallucinations  Can take injections but not oral meds    Family History  Problem Relation Age of Onset  . Multiple sclerosis Mother   . Hypertension Mother   . Coronary artery disease Father   . Heart disease Father   . Hypertension Father   . Heart disease Brother   . Cancer Brother   . Breast cancer Paternal Aunt     Social History Social History  Substance Use Topics  .  Smoking status: Never Smoker  . Smokeless tobacco: Never Used  . Alcohol use No    Review of Systems Constitutional: Negative for fever. Cardiovascular: Negative for chest pain. Respiratory: Negative for shortness of breath. Gastrointestinal: Negative for abdominal pain Genitourinary: Negative for dysuria. Musculoskeletal: Negative for back pain. Neurological: Negative for . States some numbness of her right side for 5 minutes earlier today which went away on its own. All other ROS negative  ____________________________________________   PHYSICAL EXAM:  VITAL SIGNS: ED Triage Vitals  Enc Vitals Group     BP 10/20/16 1803 128/77     Pulse Rate 10/20/16 1803 91     Resp 10/20/16 1803 18     Temp 10/20/16 1803 98.3 F (36.8 C)     Temp Source 10/20/16 1803 Oral     SpO2 10/20/16 1803 98 %     Weight 10/20/16 1803 218 lb (98.9 kg)     Height 10/20/16 1803 5\' 2"  (1.575 m)     Head Circumference --      Peak Flow --      Pain Score 10/20/16 1802 6     Pain Loc --      Pain Edu? --      Excl. in [place]? --     Constitutional: Alert and oriented. Chronically ill-appearing but in no acute distress. Eyes: Normal exam ENT   Head: Normocephalic and atraumatic   Mouth/Throat: Mucous membranes are moist. No appreciable nasal congestion. Tympanic membranes are clear. Cardiovascular: Normal rate, regular rhythm. No murmur Respiratory: Normal respiratory effort  without tachypnea nor retractions. Breath sounds are clear  Gastrointestinal: Soft and nontender. No distention.   Musculoskeletal: Nontender with normal range of motion in all extremities.  Neurologic:  Normal speech and language. No gross focal neurologic deficits. Equal grip strengths. Equal strength in all extremities. No pronator drift. Cranial nerves intact. Skin:  Skin is warm, dry and intact.  Psychiatric: Mood and affect are normal. Speech and behavior are normal.     INITIAL IMPRESSION / ASSESSMENT AND PLAN / ED COURSE  Pertinent labs & imaging results that were available during my care of the patient were reviewed by me and considered in my medical decision making (see chart for details).  Patient presents to the emergency department with continued dizziness and facial congestion, also states for approximate 5 minutes today she felt like her right leg was numb. Currently the patient appears well, no acute distress. Normal neurological exam. I discussed the workup with the patient including labs, urinalysis and a CT scan of the head. Patient became tearful and states she does not want a CT scan of the head. States she is too scared to get in the CT scanner. I attempted to explain how brief of a test was, however the patient states she does not want to have it performed. As the patient currently appears well with a normal neurological exam we will check labs and if normal have the patient follow-up with her primary care doctor for further recommendations.  Patient's labs are normal. They are asking to be discharged from the emergency department. We will discharge patient home at this time with PCP follow-up.  ____________________________________________   FINAL CLINICAL IMPRESSION(S) / ED DIAGNOSES  Dizziness    Harvest Dark, MD 10/20/16 2155

## 2016-10-20 NOTE — ED Triage Notes (Addendum)
Pt arrived via EMS from home for reports of dizziness. Pt reports has been on antibiotics for ear infection and sinus infection. Pt reports dizziness became worse today. Pt reports some nausea. EMS reports 141/81, 90 HR, 96% RA. Pt reports her ears are popping a lot. Bilateral strong hand grips, facial symmetry intact, speech clear. No protocols per Dr. Kerman Passey.

## 2016-10-21 ENCOUNTER — Encounter: Payer: Self-pay | Admitting: Pulmonary Disease

## 2016-10-21 DIAGNOSIS — J449 Chronic obstructive pulmonary disease, unspecified: Secondary | ICD-10-CM

## 2016-10-22 ENCOUNTER — Ambulatory Visit (INDEPENDENT_AMBULATORY_CARE_PROVIDER_SITE_OTHER): Payer: Medicare Other | Admitting: Family

## 2016-10-22 ENCOUNTER — Encounter: Payer: Self-pay | Admitting: Family

## 2016-10-22 VITALS — BP 126/78 | HR 83 | Temp 97.9°F | Ht 62.0 in | Wt 214.0 lb

## 2016-10-22 DIAGNOSIS — R42 Dizziness and giddiness: Secondary | ICD-10-CM

## 2016-10-22 DIAGNOSIS — H6503 Acute serous otitis media, bilateral: Secondary | ICD-10-CM

## 2016-10-22 MED ORDER — NEOMYCIN-POLYMYXIN-HC 1 % OT SOLN: OTIC | 0 refills | Status: DC

## 2016-10-22 NOTE — Assessment & Plan Note (Signed)
Afebrile. Patient is well-appearing. Reassured by benign exam. Advised patient to go ahead and stop clindamycin aw does not seem to be helping patient. Likely patient suffering from eustachian tube dysfunction would benefit from Flonase, prednisone. We are limited as patient is unable to tolerate these medications. I offered a otic antibiotic to see if this helps his symptoms. Return precautions given.

## 2016-10-22 NOTE — Assessment & Plan Note (Addendum)
Acute on chronic complaint. Reassured by normal neurologic exam.Etiology unclear at this point. Reassured as patient is not orthostatic today (see flow sheet). No syncopal episodes. She's currently following Dr. Tami Ribas for physical therapy rehabilitation for balance which I encouraged her to continue doing. Advised her to have continued, close follow with her PCP for symptom.

## 2016-10-22 NOTE — Progress Notes (Signed)
Subjective:    Patient ID: Lauren Bartlett, female    DOB: 07/06/1955, 61 y.o.   MRN: [id number]  CC: Lauren Bartlett is a 61 y.o. female who presents today for an acute visit.    HPI: seen in the ED for dizziness. States 'stroke was ruled out.'  Chief complaint today is sinus congestion, dizziness, headache. Describes HA as 'mild' and 'annoying' She is recently start on clindamycin and endorses a rash yesterday on chest for a couple hours. Bright red, today has resolved. No SOB, trouble breathing at that time yesterday.   Dr Tami Ribas started clindamycin 8 days ago for right 'inner ear' with little improvement. 'thinks causing dizziness.' No vision changes, severe HA.   Cannot take oral prednisone. Has tried IM prednisone last Monday and no difference. Cannot take flonase as dries out nose and causes nose bleeds  No cough, SOB, wheezing, CP.   Assessment and ED 74/23 for dizziness. Patient doesn't climb the CT head at that time. Neurologic exam was normal and patient was advised to follow-up with primary care provider  Mildly low calcium in ED. Urine negative for leukocytes, nitrites, blood. Normal cbc.   Uses cane. Lives alone.   Notes that dizziness for months and has been seeing ENT with no real improvement. Tearful when thinking about if she could fall. No vertigo, tinnitus. Endorses hearing loss- chronic , 'was born that way.' Awaiting hearing aids.   Doing PT with ENT for balance.   No h/o MI.      No h/o CVA. On asa  HISTORY:  Past Medical History:  Diagnosis Date  . Anxiety   . Bleeding from the nose   . Chronic diastolic congestive heart failure ([place])    a. 03/2016 Echo: EF 55-60%, Gr2 DD.  [name] Congenital absence of one kidney   . COPD (chronic obstructive pulmonary disease) ([place])   . Depression    treated at [facility]  . Dyspnea   . Emphysema/COPD ([place])   . GERD (gastroesophageal reflux disease)   . History of sciatica   . Hypertension   . Hypothyroidism   .  Mitral Valve Prolapse & Severe mitral regurgitation s/p MVR    a. 03/2016 Echo: severe MVP involving the posterior leaflet, sev MR;  b. 06/2016 minimally invasive MV repair w/ triangular resection of flail segment (P2) of posterior leaflet, gore-tex neochord placement x 4, Sorin Memo 3D rin Annulosplasty (82mm, catalog # [id number], ser # [id number]).  . Motion sickness    cars  . Pneumonia   . PONV (postoperative nausea and vomiting)   . Post-op Afib    a. 06/2016 following MV repair-->amio (dose reduced 2/2 QT prolongation), coumadin.  . Pulmonary hypertension ([place])   . S/P patent foramen ovale closure    a. 06/2016 @ time of MV Repair.  . Sleep apnea    O2 at night and PRN   Past Surgical History:  Procedure Laterality Date  . ABDOMINAL HYSTERECTOMY    . BREAST BIOPSY Right 2011   [facility]< benign  . CATARACT EXTRACTION W/PHACO Left 11/14/2015   Procedure: CATARACT EXTRACTION PHACO AND INTRAOCULAR LENS PLACEMENT (IOC);  Surgeon: Leandrew Koyanagi, MD;  Location: [place];  Service: Ophthalmology;  Laterality: Left;  sleep apnea [place]  . CATARACT EXTRACTION W/PHACO Right 12/19/2015   Procedure: CATARACT EXTRACTION PHACO AND INTRAOCULAR LENS PLACEMENT (IOC);  Surgeon: Leandrew Koyanagi, MD;  Location: [place];  Service: Ophthalmology;  Laterality: Right;  ANXIETY GENEROUS IV SEDATION TORIC LEN  .  COMBINED HYSTERECTOMY ABDOMINAL W/ A&P REPAIR / OOPHORECTOMY  1996   benign tumor  . INNER EAR SURGERY     bilateral  . MITRAL VALVE REPAIR Right 07/17/2016   Procedure: MINIMALLY INVASIVE MITRAL VALVE REPAIR (MVR) USING 26 SORIN MEMO 3D ANNULOPLASTY RING;  Surgeon: Rexene Alberts, MD;  Location: [place];  Service: Open Heart Surgery;  Laterality: Right;  . PATENT FORAMEN OVALE(PFO) CLOSURE N/A 07/17/2016   Procedure: PATENT FORAMEN OVALE (PFO) CLOSURE;  Surgeon: Rexene Alberts, MD;  Location: [place];  Service: Open Heart Surgery;  Laterality: N/A;  . RIGHT/LEFT HEART CATH AND CORONARY  ANGIOGRAPHY Bilateral 06/16/2016   Procedure: Right/Left Heart Cath and Coronary Angiography;  Surgeon: Wellington Hampshire, MD;  Location: [place] [facility];  Service: Cardiovascular;  Laterality: Bilateral;  . TEE WITHOUT CARDIOVERSION N/A 05/19/2016   Procedure: TRANSESOPHAGEAL ECHOCARDIOGRAM (TEE);  Surgeon: Wellington Hampshire, MD;  Location: [facility];  Service: Cardiovascular;  Laterality: N/A;  . TEE WITHOUT CARDIOVERSION N/A 06/09/2016   Procedure: TRANSESOPHAGEAL ECHOCARDIOGRAM (TEE);  Surgeon: Wellington Hampshire, MD;  Location: [facility];  Service: Cardiovascular;  Laterality: N/A;  . TEE WITHOUT CARDIOVERSION N/A 06/16/2016   Procedure: TRANSESOPHAGEAL ECHOCARDIOGRAM (TEE);  Surgeon: Wellington Hampshire, MD;  Location: [facility];  Service: Cardiovascular;  Laterality: N/A;  . TEE WITHOUT CARDIOVERSION N/A 07/17/2016   Procedure: TRANSESOPHAGEAL ECHOCARDIOGRAM (TEE);  Surgeon: Rexene Alberts, MD;  Location: [place];  Service: Open Heart Surgery;  Laterality: N/A;  . TONSILLECTOMY     Family History  Problem Relation Age of Onset  . Multiple sclerosis Mother   . Hypertension Mother   . Coronary artery disease Father   . Heart disease Father   . Hypertension Father   . Heart disease Brother   . Cancer Brother   . Breast cancer Paternal Aunt     Allergies: Codeine; Levaquin [levofloxacin]; Penicillins; Zithromax [azithromycin]; and Prednisone Current Outpatient Prescriptions on File Prior to Visit  Medication Sig Dispense Refill  . acetaminophen (TYLENOL) 500 MG tablet Take 500 mg by mouth 2 (two) times daily as needed for moderate pain or headache.    . albuterol (PROVENTIL HFA;VENTOLIN HFA) 108 (90 Base) MCG/ACT inhaler Inhale 2 puffs into the lungs every 6 (six) hours as needed for wheezing or shortness of breath. Pt uses with SPACER.    [name] albuterol (PROVENTIL) (2.5 MG/3ML) 0.083% nebulizer solution Take 2.5 mg by nebulization every 6 (six) hours as needed for wheezing or shortness of breath.     [name] aspirin EC 81 MG EC tablet Take 1 tablet (81 mg total) by mouth daily.    . benzonatate (TESSALON) 200 MG capsule Take 1 capsule (200 mg total) by mouth 3 (three) times daily as needed for cough. 60 capsule 1  . clarithromycin (BIAXIN) 500 MG tablet Take 500 mg by mouth 2 (two) times daily.    [name] docusate sodium (COLACE) 100 MG capsule TAKE 2 CAPSULES BY MOUTH EVERY DAY 60 capsule 5  . doxycycline (ADOXA) 100 MG tablet Take 100 mg by mouth.    . ergocalciferol (DRISDOL) 50000 units capsule Take 1 capsule (50,000 Units total) by mouth once a week. 4 capsule 11  . famotidine (PEPCID AC) 10 MG chewable tablet Chew 10 mg by mouth daily as needed for heartburn.    . ferrous sulfate 325 (65 FE) MG tablet Take 325 mg by mouth daily with breakfast.    . fluticasone (FLONASE) 50 MCG/ACT nasal spray Place 2 sprays into both  nostrils daily. 16 g 2  . furosemide (LASIX) 40 MG tablet TAKE 1 TABLET BY MOUTH EVERY DAY 30 tablet 2  . Hypromellose (ISOPTO TEARS) 0.5 % SOLN Apply 1 drop to eye 2 (two) times daily.    [name] ipratropium (ATROVENT) 0.03 % nasal spray USE 2 SPRAYS NASALLY THREE TIMES DAILY 90 mL 1  . levothyroxine (SYNTHROID, LEVOTHROID) 125 MCG tablet Take 1 tablet (125 mcg total) by mouth daily before breakfast. 30 tablet 5  . loratadine-pseudoephedrine (CLARITIN-D 24-HOUR) 10-240 MG 24 hr tablet Take 1 tablet by mouth daily.    [name] LORazepam (ATIVAN) 0.5 MG tablet Take 1 tablet (0.5 mg total) by mouth every 8 (eight) hours as needed for anxiety. 30 tablet 0  . metoprolol tartrate (LOPRESSOR) 25 MG tablet TABLET ONE-HALF TABLET BY MOUTH TWICE DAILY 60 tablet 2  . OXYGEN Inhale 2 L into the lungs at bedtime as needed (at bedtime and as needed).    [name] oxymetazoline (AFRIN) 0.05 % nasal spray Place 1 spray into both nostrils 2 (two) times daily. As needed for nose bleed    . polyethylene glycol powder (GLYCOLAX/MIRALAX) powder Take 17 g by mouth 2 (two) times daily as needed. (Patient taking differently:  Take 17 g by mouth daily as needed for mild constipation. ) 3350 g 1  . traMADol (ULTRAM) 50 MG tablet Take 1-2 tablets (50-100 mg total) by mouth every 4 (four) hours as needed for moderate pain. 30 tablet 0  . umeclidinium-vilanterol (ANORO ELLIPTA) 62.5-25 MCG/INH AEPB Inhale 1 puff into the lungs daily. 1 each 11  . vitamin B-12 (CYANOCOBALAMIN) 1000 MCG tablet Take 1 tablet (1,000 mcg total) by mouth daily. 90 tablet 3   No current facility-administered medications on file prior to visit.     Social History  Substance Use Topics  . Smoking status: Never Smoker  . Smokeless tobacco: Never Used  . Alcohol use No    Review of Systems  Constitutional: Negative for chills and fever.  HENT: Positive for congestion, ear pain, hearing loss (chronic) and sinus pain. Negative for sinus pressure and sore throat.   Respiratory: Negative for cough, shortness of breath and wheezing.   Cardiovascular: Negative for chest pain and palpitations.  Gastrointestinal: Negative for nausea and vomiting.  Skin: Positive for rash.  Neurological: Positive for dizziness and headaches. Negative for syncope.      Objective:    BP 126/78   Pulse 83   Temp 97.9 F (36.6 C) (Oral)   Ht 5\' 2"  (1.575 m)   Wt 214 lb (97.1 kg)   SpO2 93%   BMI 39.14 kg/m    Physical Exam  Constitutional: She appears well-developed and well-nourished.  HENT:  Head: Normocephalic and atraumatic.  Right Ear: Hearing, tympanic membrane, external ear and ear canal normal. No swelling or tenderness. Tympanic membrane is not erythematous and not bulging. No middle ear effusion.  Left Ear: Tympanic membrane, external ear and ear canal normal. No swelling or tenderness. Tympanic membrane is not erythematous and not bulging.  No middle ear effusion.  Nose: Nose normal. No rhinorrhea. Right sinus exhibits no maxillary sinus tenderness and no frontal sinus tenderness. Left sinus exhibits no maxillary sinus tenderness and no frontal  sinus tenderness.  Mouth/Throat: Uvula is midline, oropharynx is clear and moist and mucous membranes are normal. No posterior oropharyngeal edema or posterior oropharyngeal erythema.  Eyes: Pupils are equal, round, and reactive to light. Conjunctivae, EOM and lids are normal. Lids are everted and swept, no  foreign bodies found.  Normal fundus bilaterally   Cardiovascular: Normal rate, regular rhythm, normal heart sounds and normal pulses.   Pulmonary/Chest: Effort normal and breath sounds normal. She has no wheezes. She has no rhonchi. She has no rales.  Lymphadenopathy:       Head (right side): No submental, no submandibular, no tonsillar, no preauricular, no posterior auricular and no occipital adenopathy present.       Head (left side): No submental, no submandibular, no tonsillar, no preauricular, no posterior auricular and no occipital adenopathy present.    She has no cervical adenopathy.       Right cervical: No superficial cervical, no deep cervical and no posterior cervical adenopathy present.      Left cervical: No superficial cervical, no deep cervical and no posterior cervical adenopathy present.  Neurological: She is alert. She has normal strength. No cranial nerve deficit or sensory deficit. She displays a negative Romberg sign.  Reflex Scores:      Bicep reflexes are 2+ on the right side and 2+ on the left side.      Patellar reflexes are 2+ on the right side and 2+ on the left side. Grip equal and strong bilateral upper extremities. Gait strong and steady. Able to perform  finger-to-nose without difficulty.   Skin: Skin is warm and dry.  Psychiatric: She has a normal mood and affect. Her speech is normal and behavior is normal. Thought content normal.  Vitals reviewed.      Assessment & Plan:   Problem List Items Addressed This Visit      Nervous and Auditory   Otitis media - Primary    Afebrile. Patient is well-appearing. Reassured by benign exam. Advised patient to go  ahead and stop clindamycin aw does not seem to be helping patient. Likely patient suffering from eustachian tube dysfunction would benefit from Flonase, prednisone. We are limited as patient is unable to tolerate these medications. I offered a otic antibiotic to see if this helps his symptoms. Return precautions given.      Relevant Medications   NEOMYCIN-POLYMYXIN-HYDROCORTISONE (CORTISPORIN) 1 % SOLN OTIC solution     Other   Dizziness    Acute on chronic complaint. Reassured by normal neurologic exam.Etiology unclear at this point. Reassured as patient is not orthostatic today (see flow sheet). No syncopal episodes. She's currently following Dr. Tami Ribas for physical therapy rehabilitation for balance which I encouraged her to continue doing. Advised her to have continued, close follow with her PCP for symptom.            I am having Ms. Hayduk start on NEOMYCIN-POLYMYXIN-HYDROCORTISONE. I am also having her maintain her loratadine-pseudoephedrine, polyethylene glycol powder, albuterol, OXYGEN, albuterol, umeclidinium-vilanterol, famotidine, acetaminophen, ipratropium, benzonatate, Hypromellose, aspirin, ferrous sulfate, vitamin B-12, docusate sodium, fluticasone, levothyroxine, LORazepam, traMADol, oxymetazoline, ergocalciferol, furosemide, metoprolol tartrate, doxycycline, and clarithromycin.   Meds ordered this encounter  Medications  . NEOMYCIN-POLYMYXIN-HYDROCORTISONE (CORTISPORIN) 1 % SOLN OTIC solution    Sig: 4 gtt in affected ear(s) tid, max of 10 days. Lie with affected ear upward x 5 minutes    Dispense:  1 Bottle    Refill:  0    Order Specific Question:   Supervising Provider    Answer:   [name] [[id number]]    Return precautions given.   Risks, benefits, and alternatives of the medications and treatment plan prescribed today were discussed, and patient expressed understanding.   Education regarding symptom management and diagnosis given to patient on  AVS.  Continue to follow with [name], MD for routine health maintenance.   Lauren Bartlett and I agreed with plan.   Mable Paris, FNP

## 2016-10-22 NOTE — Patient Instructions (Addendum)
As discussed, we'll go ahead and trial antibiotic eardrops for your right ear to see this improves the pain and possibly dizziness.   You may stop the clindamycin if not helping though I would encourage you to take a probiotic for next 2 weeks to prevent gut infection.  However suspect the dizziness is multifactorial, and unfortunately today cannot tell you why you're having these episodes. I would encourage you  to continue following with Dr Tami Ribas and ensure you continue physical therapy as very important for gait, strength and prevention of dizziness.  Plenty of water is also important and take your time to go from sitting to standing.  Follow up in 2 weeks.

## 2016-10-27 ENCOUNTER — Ambulatory Visit
Admission: RE | Admit: 2016-10-27 | Discharge: 2016-10-27 | Disposition: A | Discharge status: Home / Self Care | Payer: Medicare Other | Source: Ambulatory Visit | Attending: Internal Medicine | Admitting: Internal Medicine

## 2016-10-27 DIAGNOSIS — Z1231 Encounter for screening mammogram for malignant neoplasm of breast: Secondary | ICD-10-CM | POA: Diagnosis not present

## 2016-10-29 ENCOUNTER — Other Ambulatory Visit: Payer: Self-pay | Admitting: Internal Medicine

## 2016-10-29 ENCOUNTER — Telehealth: Payer: Self-pay | Admitting: Internal Medicine

## 2016-10-29 MED ORDER — LOSARTAN POTASSIUM 50 MG PO TABS: 50.0000 mg | ORAL_TABLET | Freq: Every day | ORAL | 0 refills | Status: DC

## 2016-10-29 NOTE — Telephone Encounter (Signed)
1/2 tab BID is what she is currently taking. She has been on this dose since 10/07/16.

## 2016-10-29 NOTE — Telephone Encounter (Signed)
Yes I will call in the losartan, but the metoprolol is important for the heart.  Is she willing to reduce the metoprolol to 1/2 tablet twice daily to see if seh can tolerate it?

## 2016-10-29 NOTE — Telephone Encounter (Signed)
Ok stop metoprolol,   Losartan start at 50 mg daily,  rx snt

## 2016-10-29 NOTE — Telephone Encounter (Signed)
Patient is aware of medication changes and will pick up new meds from pharmacy.

## 2016-10-29 NOTE — Telephone Encounter (Signed)
Reason for call: Medication change  Symptoms: lightheaded/dizziness   Duration: since start of metoprolol   Medications: changed from losartan  Last seen for this problem:   Seen by: Mable Paris, NP on 10/22/16 for otitis media and sinus congestion.  Patient stated dizziness and light headedness started when BP meds were changed at [facility]. MD at facility changed her from losartan to metoprolol. Patient states that dizziness started intermittent and has became chronic. After reading notes, meds were change in the hospital per discharge summary 07/22/2016. Patient would like for her meds to be changed back.

## 2016-10-29 NOTE — Telephone Encounter (Signed)
Pt called and stated that she had open heart surgery in April and then she went to [facility] and they changed her blood pressure medication to metoprolol tartrate (LOPRESSOR) 25 MG tablet and she states that she is having difficulties with dizziness and lightheaded. PT would like to know if Dr. Derrel Nip could change her medication back to what she was taking. Please advise, thank you!  Call pt @ [phone]

## 2016-10-31 ENCOUNTER — Telehealth: Payer: Self-pay | Admitting: *Deleted

## 2016-10-31 NOTE — Telephone Encounter (Signed)
Pt informed of results of ONO. Will continue with 2L of 02 qhs. Nothing further needed.

## 2016-11-03 DIAGNOSIS — R42 Dizziness and giddiness: Secondary | ICD-10-CM | POA: Diagnosis not present

## 2016-11-04 DIAGNOSIS — R42 Dizziness and giddiness: Secondary | ICD-10-CM | POA: Diagnosis not present

## 2016-11-04 DIAGNOSIS — R2681 Unsteadiness on feet: Secondary | ICD-10-CM | POA: Diagnosis not present

## 2016-11-05 ENCOUNTER — Telehealth: Payer: Self-pay | Admitting: *Deleted

## 2016-11-05 DIAGNOSIS — J449 Chronic obstructive pulmonary disease, unspecified: Secondary | ICD-10-CM | POA: Diagnosis not present

## 2016-11-05 NOTE — Telephone Encounter (Signed)
Pt requested to speak with someone about her lasix script  Pt contact [phone]

## 2016-11-06 ENCOUNTER — Other Ambulatory Visit: Payer: Self-pay | Admitting: Family

## 2016-11-06 ENCOUNTER — Encounter: Payer: Self-pay | Admitting: Family

## 2016-11-06 ENCOUNTER — Ambulatory Visit (INDEPENDENT_AMBULATORY_CARE_PROVIDER_SITE_OTHER): Payer: Medicare Other | Admitting: Family

## 2016-11-06 VITALS — BP 126/88 | HR 100 | Temp 97.9°F | Ht 63.0 in | Wt 217.0 lb

## 2016-11-06 DIAGNOSIS — I10 Essential (primary) hypertension: Secondary | ICD-10-CM

## 2016-11-06 DIAGNOSIS — R42 Dizziness and giddiness: Secondary | ICD-10-CM

## 2016-11-06 DIAGNOSIS — I5032 Chronic diastolic (congestive) heart failure: Secondary | ICD-10-CM

## 2016-11-06 LAB — BASIC METABOLIC PANEL
BUN: 16 mg/dL (ref 6–23)
CHLORIDE: 101 meq/L (ref 96–112)
CO2: 34 mEq/L — ABNORMAL HIGH (ref 19–32)
Calcium: 8.8 mg/dL (ref 8.4–10.5)
Creatinine, Ser: 0.91 mg/dL (ref 0.40–1.20)
GFR: 66.81 mL/min (ref >60.00)
Glucose, Bld: 87 mg/dL (ref 70–99)
POTASSIUM: 3.9 meq/L (ref 3.5–5.1)
SODIUM: 141 meq/L (ref 135–145)

## 2016-11-06 MED ORDER — FUROSEMIDE 20 MG PO TABS: 20.0000 mg | ORAL_TABLET | Freq: Every day | ORAL | 1 refills | Status: DC

## 2016-11-06 NOTE — Assessment & Plan Note (Addendum)
Uncontrolled. Reassured by normal HEENT and neurologic exam. Suspect lasix may be contributory. She is not orthostatic ( see flow sheet). Patient is quite distraught by dizziness and is worried that she will not be able to live at home if she continues to feels dizzy. Education provided on the reason for diurectic therapy in context of HF, the importance of daily weights, limiting salt.Advised MRI brain, declines today due to dizziness. She states she will consider CT head if dizziness does not improve; will discuss at f/u.    In this context, we jointly agreed it is reasonable to trial decreasing diuretic therapy to see if her symptoms improve. Decrease Lasix to 20 mg per day. Education provided on heart failure symptoms and exacerbation. Follow up in one monthFollow up in one month.

## 2016-11-06 NOTE — Patient Instructions (Signed)
As discussed, I do not know specific etiology for dizziness. I would again advise a brain MRI.   You're not particularly orthostatic today.   We will try to decrease her Lasix see if this helps with the symptom.   Remember as discussed, you take the Lasix to ensure you do not have fluid accumulation from heart failure. You need to be monitoring weights daily, and also ensuring you have no cough, shortness of breath which may be a signal that your heart failure beginning to flare.   Please follow-up in a couple weeks to 1 month.

## 2016-11-06 NOTE — Assessment & Plan Note (Addendum)
Well controlled today. Continue current regimen. Pending bmp

## 2016-11-06 NOTE — Progress Notes (Signed)
Pre visit review using our clinic review tool, if applicable. No additional management support is needed unless otherwise documented below in the visit note. 

## 2016-11-06 NOTE — Assessment & Plan Note (Addendum)
No adventitious lung sounds to suggest fluid volume over load. Weight is at baseline ( she had been 218 two weeks ago). Advised to continue to follow with dr Fletcher Anon

## 2016-11-06 NOTE — Telephone Encounter (Signed)
Pt is in office seeing Mable Paris, NP and has discussed this with her.

## 2016-11-06 NOTE — Progress Notes (Signed)
Subjective:    Patient ID: Lauren Bartlett, female    DOB: Aug 13, 1955, 61 y.o.   MRN: [id number]  CC: Lauren Bartlett is a 61 y.o. female who presents today for follow up.   HPI: Patient here for reevaluation after seen one week ago for bilateral ear pain. Suspected eustachian tube dysfunction, started on cortisporin.   Has been to ENT 2 days ago and told pain was related to TMJ.   Here today because of dizziness, 'feels a drunkness', 'thinks my medication is  Too strong.' Thinks lasix is too strong. After surgery, started lasix soon after which thinks is when she felt dizzy.   No orthopnea, changes in weight, cough, sob.  Complains of diziness, lightheadedness. Didn't tell Dr Roxy Manns about dizziness as 'wasn't bad then.'   Watches weight. Follows low salt diet.   Denies exertional chest pain or pressure, numbness or tingling radiating to left arm or jaw, palpitations,  frequent headaches, changes in vision, or shortness of breath.   06/2016 MVP and mitral regurgitation repair   08/2016-  dr Fletcher Anon; reviewed note.Mildly orthostatic. Small dose of metoprolol. Advised to avoid sudden changes in position.   09/2016 Dr Roxy Manns- reviewed           HISTORY:  Past Medical History:  Diagnosis Date  . Anxiety   . Bleeding from the nose   . Chronic diastolic congestive heart failure ([place])    a. 03/2016 Echo: EF 55-60%, Gr2 DD.  [name] Congenital absence of one kidney   . COPD (chronic obstructive pulmonary disease) ([place])   . Depression    treated at [facility]  . Dyspnea   . Emphysema/COPD ([place])   . GERD (gastroesophageal reflux disease)   . History of sciatica   . Hypertension   . Hypothyroidism   . Mitral Valve Prolapse & Severe mitral regurgitation s/p MVR    a. 03/2016 Echo: severe MVP involving the posterior leaflet, sev MR;  b. 06/2016 minimally invasive MV repair w/ triangular resection of flail segment (P2) of posterior leaflet, gore-tex neochord placement x 4, Sorin Memo 3D rin  Annulosplasty (58mm, catalog # [id number], ser # [id number]).  . Motion sickness    cars  . Pneumonia   . PONV (postoperative nausea and vomiting)   . Post-op Afib    a. 06/2016 following MV repair-->amio (dose reduced 2/2 QT prolongation), coumadin.  . Pulmonary hypertension ([place])   . S/P patent foramen ovale closure    a. 06/2016 @ time of MV Repair.  . Sleep apnea    O2 at night and PRN   Past Surgical History:  Procedure Laterality Date  . ABDOMINAL HYSTERECTOMY    . BREAST BIOPSY Right 2011   [facility]< benign  . CATARACT EXTRACTION W/PHACO Left 11/14/2015   Procedure: CATARACT EXTRACTION PHACO AND INTRAOCULAR LENS PLACEMENT (IOC);  Surgeon: Leandrew Koyanagi, MD;  Location: [place];  Service: Ophthalmology;  Laterality: Left;  sleep apnea [place]  . CATARACT EXTRACTION W/PHACO Right 12/19/2015   Procedure: CATARACT EXTRACTION PHACO AND INTRAOCULAR LENS PLACEMENT (IOC);  Surgeon: Leandrew Koyanagi, MD;  Location: [place];  Service: Ophthalmology;  Laterality: Right;  ANXIETY GENEROUS IV SEDATION TORIC LEN  . COMBINED HYSTERECTOMY ABDOMINAL W/ A&P REPAIR / OOPHORECTOMY  1996   benign tumor  . INNER EAR SURGERY     bilateral  . MITRAL VALVE REPAIR Right 07/17/2016   Procedure: MINIMALLY INVASIVE MITRAL VALVE REPAIR (MVR) USING 26 SORIN MEMO 3D ANNULOPLASTY RING;  Surgeon: Valentina Gu  Roxy Manns, MD;  Location: [place];  Service: Open Heart Surgery;  Laterality: Right;  . PATENT FORAMEN OVALE(PFO) CLOSURE N/A 07/17/2016   Procedure: PATENT FORAMEN OVALE (PFO) CLOSURE;  Surgeon: Rexene Alberts, MD;  Location: [place];  Service: Open Heart Surgery;  Laterality: N/A;  . RIGHT/LEFT HEART CATH AND CORONARY ANGIOGRAPHY Bilateral 06/16/2016   Procedure: Right/Left Heart Cath and Coronary Angiography;  Surgeon: Wellington Hampshire, MD;  Location: [facility];  Service: Cardiovascular;  Laterality: Bilateral;  . TEE WITHOUT CARDIOVERSION N/A 05/19/2016   Procedure: TRANSESOPHAGEAL  ECHOCARDIOGRAM (TEE);  Surgeon: Wellington Hampshire, MD;  Location: [facility];  Service: Cardiovascular;  Laterality: N/A;  . TEE WITHOUT CARDIOVERSION N/A 06/09/2016   Procedure: TRANSESOPHAGEAL ECHOCARDIOGRAM (TEE);  Surgeon: Wellington Hampshire, MD;  Location: [facility];  Service: Cardiovascular;  Laterality: N/A;  . TEE WITHOUT CARDIOVERSION N/A 06/16/2016   Procedure: TRANSESOPHAGEAL ECHOCARDIOGRAM (TEE);  Surgeon: Wellington Hampshire, MD;  Location: [facility];  Service: Cardiovascular;  Laterality: N/A;  . TEE WITHOUT CARDIOVERSION N/A 07/17/2016   Procedure: TRANSESOPHAGEAL ECHOCARDIOGRAM (TEE);  Surgeon: Rexene Alberts, MD;  Location: [place];  Service: Open Heart Surgery;  Laterality: N/A;  . TONSILLECTOMY     Family History  Problem Relation Age of Onset  . Multiple sclerosis Mother   . Hypertension Mother   . Coronary artery disease Father   . Heart disease Father   . Hypertension Father   . Heart disease Brother   . Cancer Brother   . Breast cancer Paternal Aunt     Allergies: Codeine; Levaquin [levofloxacin]; Penicillins; Zithromax [azithromycin]; and Prednisone Current Outpatient Prescriptions on File Prior to Visit  Medication Sig Dispense Refill  . acetaminophen (TYLENOL) 500 MG tablet Take 500 mg by mouth 2 (two) times daily as needed for moderate pain or headache.    . albuterol (PROVENTIL HFA;VENTOLIN HFA) 108 (90 Base) MCG/ACT inhaler Inhale 2 puffs into the lungs every 6 (six) hours as needed for wheezing or shortness of breath. Pt uses with SPACER.    [name] albuterol (PROVENTIL) (2.5 MG/3ML) 0.083% nebulizer solution Take 2.5 mg by nebulization every 6 (six) hours as needed for wheezing or shortness of breath.    [name] aspirin EC 81 MG EC tablet Take 1 tablet (81 mg total) by mouth daily.    . benzonatate (TESSALON) 200 MG capsule Take 1 capsule (200 mg total) by mouth 3 (three) times daily as needed for cough. 60 capsule 1  . docusate sodium (COLACE) 100 MG capsule TAKE 2 CAPSULES BY  MOUTH EVERY DAY 60 capsule 5  . doxycycline (ADOXA) 100 MG tablet Take 100 mg by mouth.    . ergocalciferol (DRISDOL) 50000 units capsule Take 1 capsule (50,000 Units total) by mouth once a week. 4 capsule 11  . famotidine (PEPCID AC) 10 MG chewable tablet Chew 10 mg by mouth daily as needed for heartburn.    . ferrous sulfate 325 (65 FE) MG tablet Take 325 mg by mouth daily with breakfast.    . fluticasone (FLONASE) 50 MCG/ACT nasal spray Place 2 sprays into both nostrils daily. 16 g 2  . Hypromellose (ISOPTO TEARS) 0.5 % SOLN Apply 1 drop to eye 2 (two) times daily.    [name] ipratropium (ATROVENT) 0.03 % nasal spray USE 2 SPRAYS NASALLY THREE TIMES DAILY 90 mL 1  . levothyroxine (SYNTHROID, LEVOTHROID) 125 MCG tablet Take 1 tablet (125 mcg total) by mouth daily before breakfast. 30 tablet 5  . loratadine-pseudoephedrine (CLARITIN-D 24-HOUR)  10-240 MG 24 hr tablet Take 1 tablet by mouth daily.    [name] LORazepam (ATIVAN) 0.5 MG tablet Take 1 tablet (0.5 mg total) by mouth every 8 (eight) hours as needed for anxiety. 30 tablet 0  . losartan (COZAAR) 50 MG tablet Take 1 tablet (50 mg total) by mouth daily. 90 tablet 0  . metoprolol tartrate (LOPRESSOR) 25 MG tablet TABLET ONE-HALF TABLET BY MOUTH TWICE DAILY 60 tablet 2  . OXYGEN Inhale 2 L into the lungs at bedtime as needed (at bedtime and as needed).    [name] oxymetazoline (AFRIN) 0.05 % nasal spray Place 1 spray into both nostrils 2 (two) times daily. As needed for nose bleed    . polyethylene glycol powder (GLYCOLAX/MIRALAX) powder Take 17 g by mouth 2 (two) times daily as needed. (Patient taking differently: Take 17 g by mouth daily as needed for mild constipation. ) 3350 g 1  . traMADol (ULTRAM) 50 MG tablet Take 1-2 tablets (50-100 mg total) by mouth every 4 (four) hours as needed for moderate pain. 30 tablet 0  . umeclidinium-vilanterol (ANORO ELLIPTA) 62.5-25 MCG/INH AEPB Inhale 1 puff into the lungs daily. 1 each 11  . vitamin B-12 (CYANOCOBALAMIN)  1000 MCG tablet Take 1 tablet (1,000 mcg total) by mouth daily. 90 tablet 3   No current facility-administered medications on file prior to visit.     Social History  Substance Use Topics  . Smoking status: Never Smoker  . Smokeless tobacco: Never Used  . Alcohol use No    Review of Systems  Constitutional: Negative for chills and fever.  Respiratory: Negative for cough and shortness of breath.   Cardiovascular: Negative for chest pain and palpitations.  Gastrointestinal: Negative for nausea and vomiting.  Neurological: Positive for dizziness. Negative for syncope and headaches.      Objective:    BP 126/88   Pulse 100   Temp 97.9 F (36.6 C) (Oral)   Ht 5\' 3"  (1.6 m)   Wt 217 lb (98.4 kg)   SpO2 94%   BMI 38.44 kg/m  BP Readings from Last 3 Encounters:  11/06/16 126/88  10/22/16 126/78  10/20/16 130/75   Wt Readings from Last 3 Encounters:  11/06/16 217 lb (98.4 kg)  10/22/16 214 lb (97.1 kg)  10/20/16 218 lb (98.9 kg)    Physical Exam  Constitutional: She appears well-developed and well-nourished.  HENT:  Head: Normocephalic and atraumatic.  Right Ear: Hearing, tympanic membrane, external ear and ear canal normal. No swelling or tenderness. Tympanic membrane is not erythematous and not bulging. No middle ear effusion.  Left Ear: Tympanic membrane, external ear and ear canal normal. No swelling or tenderness. Tympanic membrane is not erythematous and not bulging.  No middle ear effusion.  Nose: Nose normal. No rhinorrhea. Right sinus exhibits no maxillary sinus tenderness and no frontal sinus tenderness. Left sinus exhibits no maxillary sinus tenderness and no frontal sinus tenderness.  Mouth/Throat: Uvula is midline, oropharynx is clear and moist and mucous membranes are normal. No posterior oropharyngeal edema or posterior oropharyngeal erythema.  Eyes: Pupils are equal, round, and reactive to light. Conjunctivae, EOM and lids are normal. Lids are everted and  swept, no foreign bodies found.  Normal fundus bilaterally   Cardiovascular: Normal rate, regular rhythm, normal heart sounds and normal pulses.   Pulmonary/Chest: Effort normal and breath sounds normal. She has no wheezes. She has no rhonchi. She has no rales.  Lymphadenopathy:       Head (right side):  No submental, no submandibular, no tonsillar, no preauricular, no posterior auricular and no occipital adenopathy present.       Head (left side): No submental, no submandibular, no tonsillar, no preauricular, no posterior auricular and no occipital adenopathy present.    She has no cervical adenopathy.       Right cervical: No superficial cervical, no deep cervical and no posterior cervical adenopathy present.      Left cervical: No superficial cervical, no deep cervical and no posterior cervical adenopathy present.  Neurological: She is alert. She has normal strength. No cranial nerve deficit or sensory deficit. She displays a negative Romberg sign.  Reflex Scores:      Bicep reflexes are 2+ on the right side and 2+ on the left side.      Patellar reflexes are 2+ on the right side and 2+ on the left side. Grip equal and strong bilateral upper extremities. Gait strong and steady. Able to perform  finger-to-nose without difficulty.   Skin: Skin is warm and dry.  Psychiatric: She has a normal mood and affect. Her speech is normal and behavior is normal. Thought content normal.  tearful  Vitals reviewed.      Assessment & Plan:   Problem List Items Addressed This Visit      Cardiovascular and Mediastinum   Hypertension (Chronic)    Well controlled today. Continue current regimen. Pending bmp      Relevant Medications   furosemide (LASIX) 20 MG tablet   Chronic diastolic congestive heart failure (HCC) (Chronic)    No adventitious lung sounds to suggest fluid volume over load. Weight is at baseline ( she had been 218 two weeks ago). Advised to continue to follow with dr Fletcher Anon       Relevant Medications   furosemide (LASIX) 20 MG tablet     Other   Dizziness - Primary    Uncontrolled. Reassured by normal HEENT and neurologic exam. Suspect lasix may be contributory. She is not orthostatic ( see flow sheet). Patient is quite distraught by dizziness and is worried that she will not be able to live at home if she continues to feels dizzy. Education provided on the reason for diurectic therapy in context of HF, the importance of daily weights, limiting salt.Advised MRI brain, declines today due to dizziness. She states she will consider CT head if dizziness does not improve; will discuss at f/u.    In this context, we jointly agreed it is reasonable to trial decreasing diuretic therapy to see if her symptoms improve. Decrease Lasix to 20 mg per day. Education provided on heart failure symptoms and exacerbation. Follow up in one monthFollow up in one month.       Relevant Medications   furosemide (LASIX) 20 MG tablet   Other Relevant Orders   Basic metabolic panel       I have discontinued Lauren Bartlett's furosemide and NEOMYCIN-POLYMYXIN-HYDROCORTISONE. I am also having her start on furosemide. Additionally, I am having her maintain her loratadine-pseudoephedrine, polyethylene glycol powder, albuterol, OXYGEN, albuterol, umeclidinium-vilanterol, famotidine, acetaminophen, ipratropium, benzonatate, Hypromellose, aspirin, ferrous sulfate, vitamin B-12, docusate sodium, fluticasone, levothyroxine, LORazepam, traMADol, oxymetazoline, ergocalciferol, metoprolol tartrate, doxycycline, and losartan.   Meds ordered this encounter  Medications  . furosemide (LASIX) 20 MG tablet    Sig: Take 1 tablet (20 mg total) by mouth daily.    Dispense:  30 tablet    Refill:  1    Order Specific Question:   Supervising Provider    Answer:  TULLO, TERESA L [[id number]]    Return precautions given.   Risks, benefits, and alternatives of the medications and treatment plan prescribed today were  discussed, and patient expressed understanding.   Education regarding symptom management and diagnosis given to patient on AVS.  Continue to follow with [name], MD for routine health maintenance.   Lauren Bartlett and I agreed with plan.   Mable Paris, FNP

## 2016-11-10 ENCOUNTER — Telehealth: Payer: Self-pay | Admitting: Internal Medicine

## 2016-11-10 NOTE — Telephone Encounter (Signed)
Pt called about wanting to have the colorgurard sent to her home. Please advise?  Call pt @ [phone]. Thank you!

## 2016-11-11 DIAGNOSIS — R2681 Unsteadiness on feet: Secondary | ICD-10-CM | POA: Diagnosis not present

## 2016-11-11 DIAGNOSIS — H903 Sensorineural hearing loss, bilateral: Secondary | ICD-10-CM | POA: Diagnosis not present

## 2016-11-11 DIAGNOSIS — R42 Dizziness and giddiness: Secondary | ICD-10-CM | POA: Diagnosis not present

## 2016-11-11 NOTE — Telephone Encounter (Signed)
Order form has been placed in quick sign folder.  

## 2016-11-17 DIAGNOSIS — Z1211 Encounter for screening for malignant neoplasm of colon: Secondary | ICD-10-CM | POA: Diagnosis not present

## 2016-11-17 DIAGNOSIS — Z1212 Encounter for screening for malignant neoplasm of rectum: Secondary | ICD-10-CM | POA: Diagnosis not present

## 2016-11-17 LAB — COLOGUARD: Cologuard: NEGATIVE

## 2016-11-20 ENCOUNTER — Encounter: Payer: Self-pay | Admitting: Family

## 2016-11-20 ENCOUNTER — Ambulatory Visit (INDEPENDENT_AMBULATORY_CARE_PROVIDER_SITE_OTHER): Payer: Medicare Other | Admitting: Family

## 2016-11-20 VITALS — BP 136/94 | HR 81 | Temp 97.7°F | Ht 63.0 in | Wt 219.2 lb

## 2016-11-20 DIAGNOSIS — R42 Dizziness and giddiness: Secondary | ICD-10-CM

## 2016-11-20 DIAGNOSIS — J309 Allergic rhinitis, unspecified: Secondary | ICD-10-CM | POA: Diagnosis not present

## 2016-11-20 NOTE — Progress Notes (Signed)
Subjective:    Patient ID: Lauren Bartlett, female    DOB: [birth year], 61 y.o.   MRN: [id number]  CC: Lauren Bartlett is a 61 y.o. female who presents today for follow up.   HPI: Since last visit dizziness has improved with decreased lasix .  'feels better.' NO falls, syncope.    Thinks 'allergies' is affecting dizziness.  Endorses post nasal drip, dry cough, ear pressure, facial pressure. NO HA, vision changes.  Using claritin D however not sure if helping. Cannot use flonase, normal saline as makes 'nose dry out.'        HISTORY:  Past Medical History:  Diagnosis Date  . Anxiety   . Bleeding from the nose   . Chronic diastolic congestive heart failure ([place])    a. 03/2016 Echo: EF 55-60%, Gr2 DD.  [name] Congenital absence of one kidney   . COPD (chronic obstructive pulmonary disease) ([place])   . Depression    treated at [facility]  . Dyspnea   . Emphysema/COPD ([place])   . GERD (gastroesophageal reflux disease)   . History of sciatica   . Hypertension   . Hypothyroidism   . Mitral Valve Prolapse & Severe mitral regurgitation s/p MVR    a. 03/2016 Echo: severe MVP involving the posterior leaflet, sev MR;  b. 06/2016 minimally invasive MV repair w/ triangular resection of flail segment (P2) of posterior leaflet, gore-tex neochord placement x 4, Sorin Memo 3D rin Annulosplasty (60mm, catalog # [id number], ser # [id number]).  . Motion sickness    cars  . Pneumonia   . PONV (postoperative nausea and vomiting)   . Post-op Afib    a. 06/2016 following MV repair-->amio (dose reduced 2/2 QT prolongation), coumadin.  . Pulmonary hypertension ([place])   . S/P patent foramen ovale closure    a. 06/2016 @ time of MV Repair.  . Sleep apnea    O2 at night and PRN   Past Surgical History:  Procedure Laterality Date  . ABDOMINAL HYSTERECTOMY    . BREAST BIOPSY Right 2011   [facility]< benign  . CATARACT EXTRACTION W/PHACO Left 11/14/2015   Procedure: CATARACT EXTRACTION PHACO AND INTRAOCULAR LENS PLACEMENT  (IOC);  Surgeon: Leandrew Koyanagi, MD;  Location: [facility];  Service: Ophthalmology;  Laterality: Left;  sleep apnea [place]  . CATARACT EXTRACTION W/PHACO Right 12/19/2015   Procedure: CATARACT EXTRACTION PHACO AND INTRAOCULAR LENS PLACEMENT (IOC);  Surgeon: Leandrew Koyanagi, MD;  Location: [facility];  Service: Ophthalmology;  Laterality: Right;  ANXIETY GENEROUS IV SEDATION TORIC LEN  . COMBINED HYSTERECTOMY ABDOMINAL W/ A&P REPAIR / OOPHORECTOMY  1996   benign tumor  . INNER EAR SURGERY     bilateral  . MITRAL VALVE REPAIR Right 07/17/2016   Procedure: MINIMALLY INVASIVE MITRAL VALVE REPAIR (MVR) USING 26 SORIN MEMO 3D ANNULOPLASTY RING;  Surgeon: Rexene Alberts, MD;  Location: [facility];  Service: Open Heart Surgery;  Laterality: Right;  . PATENT FORAMEN OVALE(PFO) CLOSURE N/A 07/17/2016   Procedure: PATENT FORAMEN OVALE (PFO) CLOSURE;  Surgeon: Rexene Alberts, MD;  Location: [facility];  Service: Open Heart Surgery;  Laterality: N/A;  . RIGHT/LEFT HEART CATH AND CORONARY ANGIOGRAPHY Bilateral 06/16/2016   Procedure: Right/Left Heart Cath and Coronary Angiography;  Surgeon: Wellington Hampshire, MD;  Location: [facility];  Service: Cardiovascular;  Laterality: Bilateral;  . TEE WITHOUT CARDIOVERSION N/A 05/19/2016   Procedure: TRANSESOPHAGEAL ECHOCARDIOGRAM (TEE);  Surgeon: Wellington Hampshire, MD;  Location: [facility];  Service:  Cardiovascular;  Laterality: N/A;  . TEE WITHOUT CARDIOVERSION N/A 06/09/2016   Procedure: TRANSESOPHAGEAL ECHOCARDIOGRAM (TEE);  Surgeon: Wellington Hampshire, MD;  Location: [facility];  Service: Cardiovascular;  Laterality: N/A;  . TEE WITHOUT CARDIOVERSION N/A 06/16/2016   Procedure: TRANSESOPHAGEAL ECHOCARDIOGRAM (TEE);  Surgeon: Wellington Hampshire, MD;  Location: [facility];  Service: Cardiovascular;  Laterality: N/A;  . TEE WITHOUT CARDIOVERSION N/A 07/17/2016   Procedure: TRANSESOPHAGEAL ECHOCARDIOGRAM (TEE);  Surgeon: Rexene Alberts, MD;  Location: [facility];  Service: Open Heart Surgery;  Laterality: N/A;  . TONSILLECTOMY     Family History  Problem Relation Age of Onset  . Multiple sclerosis Mother   . Hypertension Mother   . Coronary artery disease Father   . Heart disease Father   . Hypertension Father   . Heart disease Brother   . Cancer Brother   . Breast cancer Paternal Aunt     Allergies: Codeine; Levaquin [levofloxacin]; Penicillins; Zithromax [azithromycin]; and Prednisone Current Outpatient Prescriptions on File Prior to Visit  Medication Sig Dispense Refill  . acetaminophen (TYLENOL) 500 MG tablet Take 500 mg by mouth 2 (two) times daily as needed for moderate pain or headache.    . albuterol (PROVENTIL HFA;VENTOLIN HFA) 108 (90 Base) MCG/ACT inhaler Inhale 2 puffs into the lungs every 6 (six) hours as needed for wheezing or shortness of breath. Pt uses with SPACER.    [name] albuterol (PROVENTIL) (2.5 MG/3ML) 0.083% nebulizer solution Take 2.5 mg by nebulization every 6 (six) hours as needed for wheezing or shortness of breath.    [name] aspirin EC 81 MG EC tablet Take 1 tablet (81 mg total) by mouth daily.    . benzonatate (TESSALON) 200 MG capsule Take 1 capsule (200 mg total) by mouth 3 (three) times daily as needed for cough. 60 capsule 1  . docusate sodium (COLACE) 100 MG capsule TAKE 2 CAPSULES BY MOUTH EVERY DAY 60 capsule 5  . doxycycline (ADOXA) 100 MG tablet Take 100 mg by mouth.    . ergocalciferol (DRISDOL) 50000 units capsule Take 1 capsule (50,000 Units total) by mouth once a week. 4 capsule 11  . famotidine (PEPCID AC) 10 MG chewable tablet Chew 10 mg by mouth daily as needed for heartburn.    . ferrous sulfate 325 (65 FE) MG tablet Take 325 mg by mouth daily with breakfast.    . fluticasone (FLONASE) 50 MCG/ACT nasal spray Place 2 sprays into both nostrils daily. 16 g 2  . furosemide (LASIX) 20 MG tablet Take 1 tablet (20 mg total) by mouth daily. 30 tablet 1  . Hypromellose (ISOPTO TEARS) 0.5 % SOLN Apply 1 drop to  eye 2 (two) times daily.    [name] ipratropium (ATROVENT) 0.03 % nasal spray USE 2 SPRAYS NASALLY THREE TIMES DAILY 90 mL 1  . levothyroxine (SYNTHROID, LEVOTHROID) 125 MCG tablet Take 1 tablet (125 mcg total) by mouth daily before breakfast. 30 tablet 5  . loratadine-pseudoephedrine (CLARITIN-D 24-HOUR) 10-240 MG 24 hr tablet Take 1 tablet by mouth daily.    [name] LORazepam (ATIVAN) 0.5 MG tablet Take 1 tablet (0.5 mg total) by mouth every 8 (eight) hours as needed for anxiety. 30 tablet 0  . losartan (COZAAR) 50 MG tablet Take 1 tablet (50 mg total) by mouth daily. 90 tablet 0  . metoprolol tartrate (LOPRESSOR) 25 MG tablet TABLET ONE-HALF TABLET BY MOUTH TWICE DAILY 60 tablet 2  . OXYGEN Inhale 2 L into the lungs at bedtime as needed (at bedtime  and as needed).    [name] oxymetazoline (AFRIN) 0.05 % nasal spray Place 1 spray into both nostrils 2 (two) times daily. As needed for nose bleed    . polyethylene glycol powder (GLYCOLAX/MIRALAX) powder Take 17 g by mouth 2 (two) times daily as needed. (Patient taking differently: Take 17 g by mouth daily as needed for mild constipation. ) 3350 g 1  . traMADol (ULTRAM) 50 MG tablet Take 1-2 tablets (50-100 mg total) by mouth every 4 (four) hours as needed for moderate pain. 30 tablet 0  . umeclidinium-vilanterol (ANORO ELLIPTA) 62.5-25 MCG/INH AEPB Inhale 1 puff into the lungs daily. 1 each 11  . vitamin B-12 (CYANOCOBALAMIN) 1000 MCG tablet Take 1 tablet (1,000 mcg total) by mouth daily. 90 tablet 3   No current facility-administered medications on file prior to visit.     Social History  Substance Use Topics  . Smoking status: Never Smoker  . Smokeless tobacco: Never Used  . Alcohol use No    Review of Systems  Constitutional: Negative for chills and fever.  HENT: Positive for congestion, rhinorrhea and sinus pressure.   Respiratory: Negative for cough, shortness of breath and wheezing.   Cardiovascular: Negative for chest pain and palpitations.    Gastrointestinal: Negative for nausea and vomiting.  Neurological: Positive for dizziness (chronic). Negative for syncope, weakness and numbness.      Objective:    BP (!) 136/94   Pulse 81   Temp 97.7 F (36.5 C) (Oral)   Ht 5\' 3"  (1.6 m)   Wt 219 lb 3.2 oz (99.4 kg)   SpO2 94%   BMI 38.83 kg/m  BP Readings from Last 3 Encounters:  11/20/16 (!) 136/94  11/06/16 126/88  10/22/16 126/78   Wt Readings from Last 3 Encounters:  11/20/16 219 lb 3.2 oz (99.4 kg)  11/06/16 217 lb (98.4 kg)  10/22/16 214 lb (97.1 kg)    Physical Exam  Constitutional: She appears well-developed and well-nourished.  HENT:  Head: Normocephalic and atraumatic.  Right Ear: Hearing, tympanic membrane, external ear and ear canal normal. No drainage, swelling or tenderness. No foreign bodies. Tympanic membrane is not erythematous and not bulging. No middle ear effusion. No decreased hearing is noted.  Left Ear: Hearing, tympanic membrane, external ear and ear canal normal. No drainage, swelling or tenderness. No foreign bodies. Tympanic membrane is not erythematous and not bulging.  No middle ear effusion. No decreased hearing is noted.  Nose: Nose normal. No rhinorrhea. Right sinus exhibits no maxillary sinus tenderness and no frontal sinus tenderness. Left sinus exhibits no maxillary sinus tenderness and no frontal sinus tenderness.  Mouth/Throat: Uvula is midline, oropharynx is clear and moist and mucous membranes are normal. No oropharyngeal exudate, posterior oropharyngeal edema, posterior oropharyngeal erythema or tonsillar abscesses.  Eyes: Conjunctivae are normal.  Cardiovascular: Regular rhythm, normal heart sounds and normal pulses.   Pulmonary/Chest: Effort normal and breath sounds normal. She has no wheezes. She has no rhonchi. She has no rales.  Lymphadenopathy:       Head (right side): No submental, no submandibular, no tonsillar, no preauricular, no posterior auricular and no occipital  adenopathy present.       Head (left side): No submental, no submandibular, no tonsillar, no preauricular, no posterior auricular and no occipital adenopathy present.    She has no cervical adenopathy.  Neurological: She is alert.  Skin: Skin is warm and dry.  Psychiatric: She has a normal mood and affect. Her speech is normal and  behavior is normal. Thought content normal.  Vitals reviewed.      Assessment & Plan:   Problem List Items Addressed This Visit      Respiratory   Allergic rhinitis - Primary    Patient is well-appearing, afebrile. Working diagnosis chronic allergic rhinitis as playing a role. Education provided on use of Flonase, nasal saline spray, Mucinex as needed. Advised patient to follow-up if symptoms not improved.        Other   Dizziness    Improved today. Reassured as patient feels like a smaller dose of Lasix has helped with her dizziness. We'll continue to follow. At this time, she politely declines a CT of her head.           I am having Ms. Feldmeier maintain her loratadine-pseudoephedrine, polyethylene glycol powder, albuterol, OXYGEN, albuterol, umeclidinium-vilanterol, famotidine, acetaminophen, ipratropium, benzonatate, Hypromellose, aspirin, ferrous sulfate, vitamin B-12, docusate sodium, fluticasone, levothyroxine, LORazepam, traMADol, oxymetazoline, ergocalciferol, metoprolol tartrate, doxycycline, losartan, and furosemide.   No orders of the defined types were placed in this encounter.   Return precautions given.   Risks, benefits, and alternatives of the medications and treatment plan prescribed today were discussed, and patient expressed understanding.   Education regarding symptom management and diagnosis given to patient on AVS.  Continue to follow with [name], MD for routine health maintenance.   Lauren Bartlett and I agreed with plan.   Mable Paris, FNP

## 2016-11-20 NOTE — Patient Instructions (Signed)
As discussed today, I think your symptoms are largely triggered by allergies. I am pleased that your dizziness has improved on the lower dose of Lasix.  You may try using Flonase every third day or even  a couple times a week as opposed to daily which dries your nose out May also try using nasal saline spray a couple times a week to wash out the allergens  [place] of water.    may also try plain Mucinex to help break up any congestion  Let me know if you're not better on this regimen

## 2016-11-20 NOTE — Progress Notes (Signed)
Pre visit review using our clinic review tool, if applicable. No additional management support is needed unless otherwise documented below in the visit note. 

## 2016-11-21 NOTE — Assessment & Plan Note (Signed)
Patient is well-appearing, afebrile. Working diagnosis chronic allergic rhinitis as playing a role. Education provided on use of Flonase, nasal saline spray, Mucinex as needed. Advised patient to follow-up if symptoms not improved.

## 2016-11-21 NOTE — Assessment & Plan Note (Signed)
Improved today. Reassured as patient feels like a smaller dose of Lasix has helped with her dizziness. We'll continue to follow. At this time, she politely declines a CT of her head.

## 2016-11-25 DIAGNOSIS — R42 Dizziness and giddiness: Secondary | ICD-10-CM | POA: Diagnosis not present

## 2016-11-25 DIAGNOSIS — R2681 Unsteadiness on feet: Secondary | ICD-10-CM | POA: Diagnosis not present

## 2016-11-27 ENCOUNTER — Telehealth: Payer: Self-pay | Admitting: *Deleted

## 2016-11-27 DIAGNOSIS — R42 Dizziness and giddiness: Secondary | ICD-10-CM

## 2016-11-27 DIAGNOSIS — I5032 Chronic diastolic (congestive) heart failure: Secondary | ICD-10-CM

## 2016-11-27 MED ORDER — FUROSEMIDE 20 MG PO TABS: 20.0000 mg | ORAL_TABLET | Freq: Every day | ORAL | 0 refills | Status: DC

## 2016-11-27 MED ORDER — LOSARTAN POTASSIUM 50 MG PO TABS: 50.0000 mg | ORAL_TABLET | Freq: Every day | ORAL | 0 refills | Status: DC

## 2016-11-27 NOTE — Telephone Encounter (Signed)
[facility]  Requested medication updates on lasartan and furosemide  Contact [phone]

## 2016-11-27 NOTE — Telephone Encounter (Signed)
rxs have been refilled

## 2016-12-03 NOTE — Telephone Encounter (Signed)
Pt called to follow up on her Cologuard results.   Call pt @ [phone]. Thank you!

## 2016-12-04 NOTE — Telephone Encounter (Signed)
I don't see that we received the results. thanks

## 2016-12-06 DIAGNOSIS — J449 Chronic obstructive pulmonary disease, unspecified: Secondary | ICD-10-CM | POA: Diagnosis not present

## 2016-12-09 ENCOUNTER — Telehealth: Payer: Self-pay | Admitting: Internal Medicine

## 2016-12-09 NOTE — Telephone Encounter (Signed)
Yes

## 2016-12-09 NOTE — Telephone Encounter (Signed)
Pt wanted to follow up on her cologuard results. Please advise? Thank you!

## 2016-12-09 NOTE — Telephone Encounter (Signed)
Spoke with the patient and gave her results, thanks

## 2016-12-09 NOTE — Telephone Encounter (Signed)
Results already called to the patient, see additional telephone note, thanks

## 2016-12-09 NOTE — Telephone Encounter (Signed)
Called [facility] to get the results and they stated that the pt's cologuard was negative on 11/23/2016. They are going to fax over results today. Is it okay to give the pt the results?

## 2016-12-10 DIAGNOSIS — R2681 Unsteadiness on feet: Secondary | ICD-10-CM | POA: Diagnosis not present

## 2016-12-10 DIAGNOSIS — R42 Dizziness and giddiness: Secondary | ICD-10-CM | POA: Diagnosis not present

## 2016-12-14 ENCOUNTER — Telehealth: Payer: Self-pay | Admitting: Internal Medicine

## 2016-12-14 NOTE — Telephone Encounter (Signed)
The results of your cologuard test were negative.   We will repeat this every 3 years  Until you are 85 for colon CA screening.  

## 2016-12-15 NOTE — Telephone Encounter (Signed)
Spoke with pt and informed her of her cologuard results. Pt gave a verbal understanding.  

## 2016-12-29 ENCOUNTER — Telehealth: Payer: Self-pay | Admitting: *Deleted

## 2016-12-29 DIAGNOSIS — J9611 Chronic respiratory failure with hypoxia: Secondary | ICD-10-CM | POA: Diagnosis not present

## 2016-12-29 NOTE — Telephone Encounter (Signed)
Have her take the losartan at night instead of in the morning.  Continue metoprolol twice daily

## 2016-12-29 NOTE — Telephone Encounter (Signed)
Patient says she is not currently taking metoprolol at all. She says she was taken off of it when she started losartan.

## 2016-12-29 NOTE — Telephone Encounter (Signed)
Chart updated

## 2016-12-29 NOTE — Telephone Encounter (Signed)
Pt requested a call for a consult on her blood pressure medication  Pt contact [phone]

## 2016-12-29 NOTE — Telephone Encounter (Signed)
Spoke with pt's nursing aid and she stated that after the pt takes her blood pressure medication she gets dizzy and wobbly when going from sitting to standing. The nurse took the pt's blood pressure at sitting and it was 140/87 then standing and it dropped to 123/67. The nurse stated that the pt was fine before taking her blood pressure medication. Please advise.

## 2016-12-30 DIAGNOSIS — J9611 Chronic respiratory failure with hypoxia: Secondary | ICD-10-CM | POA: Diagnosis not present

## 2016-12-31 DIAGNOSIS — J9611 Chronic respiratory failure with hypoxia: Secondary | ICD-10-CM | POA: Diagnosis not present

## 2017-01-01 ENCOUNTER — Ambulatory Visit
Admission: EM | Admit: 2017-01-01 | Discharge: 2017-01-01 | Disposition: A | Discharge status: Home / Self Care | Payer: Medicare HMO | Attending: Family Medicine | Admitting: Family Medicine

## 2017-01-01 ENCOUNTER — Encounter: Payer: Self-pay | Admitting: Emergency Medicine

## 2017-01-01 DIAGNOSIS — H938X1 Other specified disorders of right ear: Secondary | ICD-10-CM | POA: Diagnosis not present

## 2017-01-01 DIAGNOSIS — R42 Dizziness and giddiness: Secondary | ICD-10-CM

## 2017-01-01 DIAGNOSIS — J9611 Chronic respiratory failure with hypoxia: Secondary | ICD-10-CM | POA: Diagnosis not present

## 2017-01-01 NOTE — ED Provider Notes (Signed)
[facility]    CSN: [id number] Arrival date & time: 01/01/17  1008     History   Chief Complaint Chief Complaint  Patient presents with  . Otalgia   HPI  61 year old female with an extensive PMH presents with ear fullness.   Patient reports "Ear fullness" and "roaring" in the ears since yesterday. R > L. No fever. No recent illness/URI symptoms. No fever. Reports associated dizziness (this seems to be recurrent). No other associated symptoms. No exacerbating or relieving factors. No other complaints or concerns at this time.  Past Medical History:  Diagnosis Date  . Anxiety   . Bleeding from the nose   . Chronic diastolic congestive heart failure ([place])    a. 03/2016 Echo: EF 55-60%, Gr2 DD.  [name] Congenital absence of one kidney   . COPD (chronic obstructive pulmonary disease) ([place])   . Depression    treated at [place]  . Dyspnea   . Emphysema/COPD ([place])   . GERD (gastroesophageal reflux disease)   . History of sciatica   . Hypertension   . Hypothyroidism   . Mitral Valve Prolapse & Severe mitral regurgitation s/p MVR    a. 03/2016 Echo: severe MVP involving the posterior leaflet, sev MR;  b. 06/2016 minimally invasive MV repair w/ triangular resection of flail segment (P2) of posterior leaflet, gore-tex neochord placement x 4, Sorin Memo 3D rin Annulosplasty (7mm, catalog # [id number], ser # [id number]).  . Motion sickness    cars  . Pneumonia   . PONV (postoperative nausea and vomiting)   . Post-op Afib    a. 06/2016 following MV repair-->amio (dose reduced 2/2 QT prolongation), coumadin.  . Pulmonary hypertension ([place])   . S/P patent foramen ovale closure    a. 06/2016 @ time of MV Repair.  . Sleep apnea    O2 at night and PRN    Patient Active Problem List   Diagnosis Date Noted  . Dizziness 10/22/2016  . Encounter for therapeutic drug monitoring 08/13/2016  . S/P minimally invasive mitral valve repair 07/17/2016  . S/P patent foramen ovale closure  07/17/2016  . Chronic diastolic congestive heart failure ([place])   . COPD (chronic obstructive pulmonary disease) ([place]) 05/19/2016  . 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  . Chronic respiratory failure with hypoxia ([place])   . Vitamin D deficiency 01/11/2015  . Allergic rhinitis 07/09/2014  . S/P hysterectomy with oophorectomy 09/22/2013  . Pulmonary hypertension ([place])   . Hypocalcemia 09/10/2012  . Hypothyroidism 05/04/2012  . Severe mitral regurgitation   . Obesity (BMI 30-39.9) 08/04/2011  . Depression   . Hypertension   . Congenital absence of one kidney   . History of sciatica     Past Surgical History:  Procedure Laterality Date  . ABDOMINAL HYSTERECTOMY    . BREAST BIOPSY Right 2011   [facility]< benign  . CATARACT EXTRACTION W/PHACO Left 11/14/2015   Procedure: CATARACT EXTRACTION PHACO AND INTRAOCULAR LENS PLACEMENT (IOC);  Surgeon: Leandrew Koyanagi, MD;  Location: [facility];  Service: Ophthalmology;  Laterality: Left;  sleep apnea [place]  . CATARACT EXTRACTION W/PHACO Right 12/19/2015   Procedure: CATARACT EXTRACTION PHACO AND INTRAOCULAR LENS PLACEMENT (IOC);  Surgeon: Leandrew Koyanagi, MD;  Location: [facility];  Service: Ophthalmology;  Laterality: Right;  ANXIETY GENEROUS IV SEDATION TORIC LEN  . COMBINED HYSTERECTOMY ABDOMINAL W/ A&P REPAIR / OOPHORECTOMY  1996   benign tumor  . INNER EAR SURGERY     bilateral  . MITRAL VALVE REPAIR Right 07/17/2016  Procedure: MINIMALLY INVASIVE MITRAL VALVE REPAIR (MVR) USING 26 SORIN MEMO 3D ANNULOPLASTY RING;  Surgeon: Rexene Alberts, MD;  Location: [facility];  Service: Open Heart Surgery;  Laterality: Right;  . PATENT FORAMEN OVALE(PFO) CLOSURE N/A 07/17/2016   Procedure: PATENT FORAMEN OVALE (PFO) CLOSURE;  Surgeon: Rexene Alberts, MD;  Location: [facility];  Service: Open Heart Surgery;  Laterality: N/A;  . RIGHT/LEFT HEART CATH AND CORONARY ANGIOGRAPHY Bilateral 06/16/2016   Procedure: Right/Left Heart Cath and Coronary  Angiography;  Surgeon: Wellington Hampshire, MD;  Location: [facility];  Service: Cardiovascular;  Laterality: Bilateral;  . TEE WITHOUT CARDIOVERSION N/A 05/19/2016   Procedure: TRANSESOPHAGEAL ECHOCARDIOGRAM (TEE);  Surgeon: Wellington Hampshire, MD;  Location: [facility];  Service: Cardiovascular;  Laterality: N/A;  . TEE WITHOUT CARDIOVERSION N/A 06/09/2016   Procedure: TRANSESOPHAGEAL ECHOCARDIOGRAM (TEE);  Surgeon: Wellington Hampshire, MD;  Location: [facility];  Service: Cardiovascular;  Laterality: N/A;  . TEE WITHOUT CARDIOVERSION N/A 06/16/2016   Procedure: TRANSESOPHAGEAL ECHOCARDIOGRAM (TEE);  Surgeon: Wellington Hampshire, MD;  Location: [facility];  Service: Cardiovascular;  Laterality: N/A;  . TEE WITHOUT CARDIOVERSION N/A 07/17/2016   Procedure: TRANSESOPHAGEAL ECHOCARDIOGRAM (TEE);  Surgeon: Rexene Alberts, MD;  Location: [facility];  Service: Open Heart Surgery;  Laterality: N/A;  . TONSILLECTOMY     OB History    No data available     Home Medications    Prior to Admission medications   Medication Sig Start Date End Date Taking? Authorizing Provider  acetaminophen (TYLENOL) 500 MG tablet Take 500 mg by mouth 2 (two) times daily as needed for moderate pain or headache.    [provider]  albuterol (PROVENTIL HFA;VENTOLIN HFA) 108 (90 Base) MCG/ACT inhaler Inhale 2 puffs into the lungs every 6 (six) hours as needed for wheezing or shortness of breath. Pt uses with SPACER.    [provider]  albuterol (PROVENTIL) (2.5 MG/3ML) 0.083% nebulizer solution Take 2.5 mg by nebulization every 6 (six) hours as needed for wheezing or shortness of breath.    [provider]  aspirin EC 81 MG EC tablet Take 1 tablet (81 mg total) by mouth daily. 07/23/16   Barrett, Erin R, PA-C  doxycycline (ADOXA) 100 MG tablet Take 100 mg by mouth.    [provider]  ergocalciferol (DRISDOL) 50000 units capsule Take 1 capsule (50,000 Units total) by mouth once a week. 09/26/16   [name], MD  famotidine (PEPCID AC) 10 MG chewable tablet Chew 10 mg by mouth daily as needed for heartburn.    [provider]  ferrous sulfate 325 (65 FE) MG tablet Take 325 mg by mouth daily with breakfast.    [provider]  fluticasone (FLONASE) 50 MCG/ACT nasal spray Place 2 sprays into both nostrils daily. 08/18/16   [name], MD  furosemide (LASIX) 20 MG tablet Take 1 tablet (20 mg total) by mouth daily. 11/27/16   [name], MD  Hypromellose (ISOPTO TEARS) 0.5 % SOLN Apply 1 drop to eye 2 (two) times daily.    [provider]  ipratropium (ATROVENT) 0.03 % nasal spray USE 2 SPRAYS NASALLY THREE TIMES DAILY 06/23/16   Laverle Hobby, MD  levothyroxine (SYNTHROID, LEVOTHROID) 125 MCG tablet Take 1 tablet (125 mcg total) by mouth daily before breakfast. 08/18/16   [name], MD  loratadine-pseudoephedrine (CLARITIN-D 24-HOUR) 10-240 MG 24 hr tablet Take 1 tablet by mouth daily.    [provider]  LORazepam (ATIVAN) 0.5 MG tablet Take 1 tablet (0.5 mg total) by mouth every 8 (eight) hours as needed for anxiety. 08/21/16 08/21/17  [name], MD  losartan (COZAAR) 50 MG tablet Take 1 tablet (50 mg total) by mouth daily. 11/27/16   [name], MD  OXYGEN Inhale 2 L into the lungs at bedtime as needed (at bedtime and as needed).    [provider]  oxymetazoline (AFRIN) 0.05 % nasal spray Place 1 spray into both nostrils 2 (two) times daily. As needed for nose bleed    [provider]  polyethylene glycol powder (GLYCOLAX/MIRALAX) powder Take 17 g by mouth 2 (two) times daily as needed. Patient taking differently: Take 17 g by mouth daily as needed for mild constipation.  08/03/15   [name], MD  traMADol (ULTRAM) 50 MG tablet Take 1-2 tablets (50-100 mg total) by mouth every 4 (four) hours as needed for moderate pain. 08/21/16   [name], MD  umeclidinium-vilanterol (ANORO ELLIPTA) 62.5-25 MCG/INH AEPB  Inhale 1 puff into the lungs daily. 04/28/16   [name], MD  vitamin B-12 (CYANOCOBALAMIN) 1000 MCG tablet Take 1 tablet (1,000 mcg total) by mouth daily. 08/14/16   [name], MD    Family History Family History  Problem Relation Age of Onset  . Multiple sclerosis Mother   . Hypertension Mother   . Coronary artery disease Father   . Heart disease Father   . Hypertension Father   . Heart disease Brother   . Cancer Brother   . Breast cancer Paternal Aunt     Social History Social History  Substance Use Topics  . Smoking status: Never Smoker  . Smokeless tobacco: Never Used  . Alcohol use No     Allergies   Codeine; Levaquin [levofloxacin]; Penicillins; Zithromax [azithromycin]; and Prednisone   Review of Systems Review of Systems  Constitutional: Negative.   HENT: Positive for tinnitus. Negative for congestion, rhinorrhea, sinus pain and sinus pressure.   Neurological: Positive for dizziness.   Physical Exam Triage Vital Signs ED Triage Vitals  Enc Vitals Group     BP 01/01/17 1026 130/60     Pulse Rate 01/01/17 1026 81     Resp 01/01/17 1026 16     Temp 01/01/17 1026 97.7 F (36.5 C)     Temp Source 01/01/17 1026 Oral     SpO2 01/01/17 1026 100 %     Weight 01/01/17 1023 219 lb (99.3 kg)     Height 01/01/17 1023 5\' 3"  (1.6 m)     Head Circumference --      Peak Flow --      Pain Score 01/01/17 1023 7     Pain Loc --      Pain Edu? --      Excl. in [place]? --    Updated Vital Signs BP 130/60 (BP Location: Left Arm)   Pulse 81   Temp 97.7 F (36.5 C) (Oral)   Resp 16   Ht 5\' 3"  (1.6 m)   Wt 219 lb (99.3 kg)   SpO2 100%   BMI 38.79 kg/m   Physical Exam  Constitutional: She is oriented to person, place, and time. She appears well-developed. No distress.  Left TM normal.  Right TM without erythema. Dullness noted.  ? Cholesteatoma.   HENT:  Head: Normocephalic and atraumatic.  Eyes: Conjunctivae are normal. Right eye exhibits no discharge.  Left eye exhibits no discharge.  Cardiovascular: Normal  rate and regular rhythm.   No murmur heard. Pulmonary/Chest: Effort normal. She has no wheezes. She has no rales.  Neurological: She is alert and oriented to person, place, and time.  Psychiatric: She has a normal mood and affect.  Vitals reviewed.  UC Treatments / Results  Labs (all labs ordered are listed, but only abnormal results are displayed) Labs Reviewed - No data to display  EKG  EKG Interpretation None      Radiology No results found.  Procedures Procedures (including critical care time)  Medications Ordered in UC Medications - No data to display  Initial Impression / Assessment and Plan / UC Course  I have reviewed the triage vital signs and the nursing notes.  Pertinent labs & imaging results that were available during my care of the patient were reviewed by me and considered in my medical decision making (see chart for details).    60 year old female with tinnitus/fullness. ? Cholesteatoma on exam. Advised to see ENT.  Final Clinical Impressions(s) / UC Diagnoses   Final diagnoses:  Sensation of fullness in right ear   New Prescriptions Discharge Medication List as of 01/01/2017 10:49 AM     Controlled Substance Prescriptions [place] Controlled Substance Registry consulted? No   Coral Spikes, [place] 01/01/17 1109

## 2017-01-01 NOTE — ED Triage Notes (Signed)
Patient c/o pain and fullness in both her ears since yesterday.  Patient denies fevers.

## 2017-01-01 NOTE — Discharge Instructions (Signed)
Call your doctor.  You need to see ENT.  Take care  Dr. Lacinda Axon

## 2017-01-02 DIAGNOSIS — J9611 Chronic respiratory failure with hypoxia: Secondary | ICD-10-CM | POA: Diagnosis not present

## 2017-01-05 DIAGNOSIS — J9611 Chronic respiratory failure with hypoxia: Secondary | ICD-10-CM | POA: Diagnosis not present

## 2017-01-05 DIAGNOSIS — J449 Chronic obstructive pulmonary disease, unspecified: Secondary | ICD-10-CM | POA: Diagnosis not present

## 2017-01-06 DIAGNOSIS — R2681 Unsteadiness on feet: Secondary | ICD-10-CM | POA: Diagnosis not present

## 2017-01-06 DIAGNOSIS — J9611 Chronic respiratory failure with hypoxia: Secondary | ICD-10-CM | POA: Diagnosis not present

## 2017-01-06 DIAGNOSIS — R42 Dizziness and giddiness: Secondary | ICD-10-CM | POA: Diagnosis not present

## 2017-01-07 DIAGNOSIS — J9611 Chronic respiratory failure with hypoxia: Secondary | ICD-10-CM | POA: Diagnosis not present

## 2017-01-08 DIAGNOSIS — J9611 Chronic respiratory failure with hypoxia: Secondary | ICD-10-CM | POA: Diagnosis not present

## 2017-01-09 DIAGNOSIS — J9611 Chronic respiratory failure with hypoxia: Secondary | ICD-10-CM | POA: Diagnosis not present

## 2017-01-12 ENCOUNTER — Telehealth: Payer: Self-pay | Admitting: Internal Medicine

## 2017-01-12 ENCOUNTER — Other Ambulatory Visit: Payer: Self-pay | Admitting: *Deleted

## 2017-01-12 DIAGNOSIS — J9611 Chronic respiratory failure with hypoxia: Secondary | ICD-10-CM | POA: Diagnosis not present

## 2017-01-12 DIAGNOSIS — I5032 Chronic diastolic (congestive) heart failure: Secondary | ICD-10-CM

## 2017-01-12 DIAGNOSIS — R42 Dizziness and giddiness: Secondary | ICD-10-CM

## 2017-01-12 MED ORDER — IPRATROPIUM BROMIDE 0.03 % NA SOLN: NASAL | 0 refills | Status: DC

## 2017-01-12 MED ORDER — FLUTICASONE PROPIONATE 50 MCG/ACT NA SUSP: 2.0000 | Freq: Every day | NASAL | 2 refills | Status: DC

## 2017-01-12 MED ORDER — ALBUTEROL SULFATE HFA 108 (90 BASE) MCG/ACT IN AERS: 2.0000 | INHALATION_SPRAY | Freq: Four times a day (QID) | RESPIRATORY_TRACT | 2 refills | Status: DC | PRN

## 2017-01-12 MED ORDER — LEVOTHYROXINE SODIUM 125 MCG PO TABS: 125.0000 ug | ORAL_TABLET | Freq: Every day | ORAL | 0 refills | Status: DC

## 2017-01-12 MED ORDER — LOSARTAN POTASSIUM 50 MG PO TABS
50.0000 mg | ORAL_TABLET | Freq: Every day | ORAL | 0 refills | Status: DC
Start: 2017-01-12 — End: 2017-04-02

## 2017-01-12 MED ORDER — FUROSEMIDE 20 MG PO TABS: 20.0000 mg | ORAL_TABLET | Freq: Every day | ORAL | 0 refills | Status: DC

## 2017-01-12 MED ORDER — UMECLIDINIUM-VILANTEROL 62.5-25 MCG/INH IN AEPB: 1.0000 | INHALATION_SPRAY | Freq: Every day | RESPIRATORY_TRACT | 11 refills | Status: DC

## 2017-01-12 NOTE — Telephone Encounter (Signed)
Medications were refilled and pt was notified.

## 2017-01-12 NOTE — Telephone Encounter (Signed)
[facility] called asking for new rx's for albuterol (PROVENTIL HFA;VENTOLIN HFA) 108 (90 Base) MCG/ACT inhaler, fluticasone (FLONASE) 50 MCG/ACT nasal spray, furosemide (LASIX) 20 MG tablet, ipratropium (ATROVENT) 0.03 % nasal spray, levothyroxine (SYNTHROID, LEVOTHROID) 125 MCG tablet, and losartan (COZAAR) 50 MG tablet. Please advise, thank you!  Fax @ [phone]

## 2017-01-13 DIAGNOSIS — J9611 Chronic respiratory failure with hypoxia: Secondary | ICD-10-CM | POA: Diagnosis not present

## 2017-01-13 NOTE — Progress Notes (Unsigned)
Error levothyroxine filled.

## 2017-01-14 DIAGNOSIS — J9611 Chronic respiratory failure with hypoxia: Secondary | ICD-10-CM | POA: Diagnosis not present

## 2017-01-14 DIAGNOSIS — H9203 Otalgia, bilateral: Secondary | ICD-10-CM | POA: Diagnosis not present

## 2017-01-15 DIAGNOSIS — J9611 Chronic respiratory failure with hypoxia: Secondary | ICD-10-CM | POA: Diagnosis not present

## 2017-01-16 DIAGNOSIS — J9611 Chronic respiratory failure with hypoxia: Secondary | ICD-10-CM | POA: Diagnosis not present

## 2017-01-19 DIAGNOSIS — J9611 Chronic respiratory failure with hypoxia: Secondary | ICD-10-CM | POA: Diagnosis not present

## 2017-01-20 DIAGNOSIS — J9611 Chronic respiratory failure with hypoxia: Secondary | ICD-10-CM | POA: Diagnosis not present

## 2017-01-21 DIAGNOSIS — J9611 Chronic respiratory failure with hypoxia: Secondary | ICD-10-CM | POA: Diagnosis not present

## 2017-01-22 DIAGNOSIS — J9611 Chronic respiratory failure with hypoxia: Secondary | ICD-10-CM | POA: Diagnosis not present

## 2017-01-23 DIAGNOSIS — J9611 Chronic respiratory failure with hypoxia: Secondary | ICD-10-CM | POA: Diagnosis not present

## 2017-01-23 DIAGNOSIS — Z79899 Other long term (current) drug therapy: Secondary | ICD-10-CM | POA: Diagnosis not present

## 2017-01-23 DIAGNOSIS — R079 Chest pain, unspecified: Secondary | ICD-10-CM | POA: Diagnosis not present

## 2017-01-23 DIAGNOSIS — R42 Dizziness and giddiness: Secondary | ICD-10-CM | POA: Diagnosis not present

## 2017-01-23 DIAGNOSIS — R9431 Abnormal electrocardiogram [ECG] [EKG]: Secondary | ICD-10-CM | POA: Diagnosis not present

## 2017-01-23 DIAGNOSIS — H9209 Otalgia, unspecified ear: Secondary | ICD-10-CM | POA: Diagnosis not present

## 2017-01-23 DIAGNOSIS — Z5181 Encounter for therapeutic drug level monitoring: Secondary | ICD-10-CM | POA: Diagnosis not present

## 2017-01-23 DIAGNOSIS — I10 Essential (primary) hypertension: Secondary | ICD-10-CM | POA: Diagnosis not present

## 2017-01-24 DIAGNOSIS — I10 Essential (primary) hypertension: Secondary | ICD-10-CM | POA: Diagnosis not present

## 2017-01-27 DIAGNOSIS — J9611 Chronic respiratory failure with hypoxia: Secondary | ICD-10-CM | POA: Diagnosis not present

## 2017-01-28 DIAGNOSIS — J9611 Chronic respiratory failure with hypoxia: Secondary | ICD-10-CM | POA: Diagnosis not present

## 2017-01-29 DIAGNOSIS — J9611 Chronic respiratory failure with hypoxia: Secondary | ICD-10-CM | POA: Diagnosis not present

## 2017-01-30 DIAGNOSIS — J9611 Chronic respiratory failure with hypoxia: Secondary | ICD-10-CM | POA: Diagnosis not present

## 2017-02-02 DIAGNOSIS — J9611 Chronic respiratory failure with hypoxia: Secondary | ICD-10-CM | POA: Diagnosis not present

## 2017-02-03 DIAGNOSIS — J9611 Chronic respiratory failure with hypoxia: Secondary | ICD-10-CM | POA: Diagnosis not present

## 2017-02-04 DIAGNOSIS — J9611 Chronic respiratory failure with hypoxia: Secondary | ICD-10-CM | POA: Diagnosis not present

## 2017-02-05 DIAGNOSIS — J449 Chronic obstructive pulmonary disease, unspecified: Secondary | ICD-10-CM | POA: Diagnosis not present

## 2017-02-05 DIAGNOSIS — J9611 Chronic respiratory failure with hypoxia: Secondary | ICD-10-CM | POA: Diagnosis not present

## 2017-02-06 DIAGNOSIS — J9611 Chronic respiratory failure with hypoxia: Secondary | ICD-10-CM | POA: Diagnosis not present

## 2017-02-09 DIAGNOSIS — J9611 Chronic respiratory failure with hypoxia: Secondary | ICD-10-CM | POA: Diagnosis not present

## 2017-02-10 ENCOUNTER — Telehealth: Payer: Self-pay

## 2017-02-10 ENCOUNTER — Ambulatory Visit (INDEPENDENT_AMBULATORY_CARE_PROVIDER_SITE_OTHER): Payer: Medicare HMO

## 2017-02-10 VITALS — BP 126/80 | HR 76 | Temp 98.1°F | Resp 15 | Ht 62.0 in | Wt 225.4 lb

## 2017-02-10 DIAGNOSIS — Z0000 Encounter for general adult medical examination without abnormal findings: Secondary | ICD-10-CM

## 2017-02-10 DIAGNOSIS — J9611 Chronic respiratory failure with hypoxia: Secondary | ICD-10-CM | POA: Diagnosis not present

## 2017-02-10 DIAGNOSIS — M5386 Other specified dorsopathies, lumbar region: Secondary | ICD-10-CM

## 2017-02-10 NOTE — Patient Instructions (Addendum)
  Lauren Bartlett , Thank you for taking time to come for your Medicare Wellness Visit. I appreciate your ongoing commitment to your health goals. Please review the following plan we discussed and let me know if I can assist you in the future.   Follow up with Dr. Derrel Nip as needed.    Influenza vaccine when feeling better.   Have a great day!  These are the goals we discussed: Eat less sweets, low carb foods, increase water intake.   This is a list of the screening recommended for you and due dates:  Health Maintenance  Topic Date Due  . Colon Cancer Screening  12/04/2005  . Flu Shot  10/29/2016  . Mammogram  10/28/2018  . Tetanus Vaccine  11/13/2019  .  Hepatitis C: One time screening is recommended by Center for Disease Control  (CDC) for  adults born from [birth year] through 1965.   Completed  . HIV Screening  Completed  . Pap Smear  Discontinued

## 2017-02-10 NOTE — Progress Notes (Signed)
Subjective:   Lauren Bartlett is a 61 y.o. female who presents for Medicare Annual (Subsequent) preventive examination.  Review of Systems:  No ROS.  Medicare Wellness Visit. Additional risk factors are reflected in the social history.  Cardiac Risk Factors include: hypertension;advanced age (>108men, >28 women);obesity (BMI >30kg/m2)     Objective:     Vitals: BP 126/80 (BP Location: Left Arm, Patient Position: Sitting, Cuff Size: Normal)   Pulse 76   Temp 98.1 F (36.7 C) (Oral)   Resp 15   Ht 5\' 2"  (1.575 m)   Wt 225 lb 6.4 oz (102.2 kg)   SpO2 98%   BMI 41.23 kg/m   Body mass index is 41.23 kg/m.   Tobacco Social History   Tobacco Use  Smoking Status Never Smoker  Smokeless Tobacco Never Used     Counseling given: Not Answered   Past Medical History:  Diagnosis Date  . Anxiety   . Bleeding from the nose   . Chronic diastolic congestive heart failure ([place])    a. 03/2016 Echo: EF 55-60%, Gr2 DD.  [name] Congenital absence of one kidney   . COPD (chronic obstructive pulmonary disease) ([place])   . Depression    treated at [place]  . Dyspnea   . Emphysema/COPD ([place])   . GERD (gastroesophageal reflux disease)   . History of sciatica   . Hypertension   . Hypothyroidism   . Mitral Valve Prolapse & Severe mitral regurgitation s/p MVR    a. 03/2016 Echo: severe MVP involving the posterior leaflet, sev MR;  b. 06/2016 minimally invasive MV repair w/ triangular resection of flail segment (P2) of posterior leaflet, gore-tex neochord placement x 4, Sorin Memo 3D rin Annulosplasty (64mm, catalog # [id number], ser # [id number]).  . Motion sickness    cars  . Pneumonia   . PONV (postoperative nausea and vomiting)   . Post-op Afib    a. 06/2016 following MV repair-->amio (dose reduced 2/2 QT prolongation), coumadin.  . Pulmonary hypertension ([place])   . S/P patent foramen ovale closure    a. 06/2016 @ time of MV Repair.  . Sleep apnea    O2 at night and PRN   Past Surgical  History:  Procedure Laterality Date  . ABDOMINAL HYSTERECTOMY    . BREAST BIOPSY Right 2011   [facility]< benign  . COMBINED HYSTERECTOMY ABDOMINAL W/ A&P REPAIR / OOPHORECTOMY  1996   benign tumor  . INNER EAR SURGERY     bilateral  . TONSILLECTOMY     Family History  Problem Relation Age of Onset  . Multiple sclerosis Mother   . Hypertension Mother   . Coronary artery disease Father   . Heart disease Father   . Hypertension Father   . Heart disease Brother   . Cancer Brother   . Breast cancer Paternal Aunt    Social History   Substance and Sexual Activity  Sexual Activity Not Currently    Outpatient Encounter Medications as of 02/10/2017  Medication Sig  . acetaminophen (TYLENOL) 500 MG tablet Take 500 mg by mouth 2 (two) times daily as needed for moderate pain or headache.  . albuterol (PROVENTIL HFA;VENTOLIN HFA) 108 (90 Base) MCG/ACT inhaler Inhale 2 puffs into the lungs every 6 (six) hours as needed for wheezing or shortness of breath. Pt uses with SPACER.  [name] albuterol (PROVENTIL) (2.5 MG/3ML) 0.083% nebulizer solution Take 2.5 mg by nebulization every 6 (six) hours as needed for wheezing or shortness of breath.  [name]  aspirin EC 81 MG EC tablet Take 1 tablet (81 mg total) by mouth daily.  [name] doxycycline (ADOXA) 100 MG tablet Take 100 mg by mouth.  . ergocalciferol (DRISDOL) 50000 units capsule Take 1 capsule (50,000 Units total) by mouth once a week.  . famotidine (PEPCID AC) 10 MG chewable tablet Chew 10 mg by mouth daily as needed for heartburn.  . ferrous sulfate 325 (65 FE) MG tablet Take 325 mg by mouth daily with breakfast.  . fluticasone (FLONASE) 50 MCG/ACT nasal spray Place 2 sprays into both nostrils daily.  . furosemide (LASIX) 20 MG tablet Take 1 tablet (20 mg total) by mouth daily.  . Hypromellose (ISOPTO TEARS) 0.5 % SOLN Apply 1 drop to eye 2 (two) times daily.  [name] ipratropium (ATROVENT) 0.03 % nasal spray USE 2 SPRAYS NASALLY THREE TIMES DAILY  . levothyroxine  (SYNTHROID, LEVOTHROID) 125 MCG tablet Take 1 tablet (125 mcg total) by mouth daily before breakfast.  . loratadine-pseudoephedrine (CLARITIN-D 24-HOUR) 10-240 MG 24 hr tablet Take 1 tablet by mouth daily.  [name] LORazepam (ATIVAN) 0.5 MG tablet Take 1 tablet (0.5 mg total) by mouth every 8 (eight) hours as needed for anxiety.  [name] losartan (COZAAR) 50 MG tablet Take 1 tablet (50 mg total) by mouth daily.  . OXYGEN Inhale 2 L into the lungs at bedtime as needed (at bedtime and as needed).  [name] oxymetazoline (AFRIN) 0.05 % nasal spray Place 1 spray into both nostrils 2 (two) times daily. As needed for nose bleed  . polyethylene glycol powder (GLYCOLAX/MIRALAX) powder Take 17 g by mouth 2 (two) times daily as needed. (Patient taking differently: Take 17 g by mouth daily as needed for mild constipation. )  . traMADol (ULTRAM) 50 MG tablet Take 1-2 tablets (50-100 mg total) by mouth every 4 (four) hours as needed for moderate pain.  [name] umeclidinium-vilanterol (ANORO ELLIPTA) 62.5-25 MCG/INH AEPB Inhale 1 puff into the lungs daily.  . vitamin B-12 (CYANOCOBALAMIN) 1000 MCG tablet Take 1 tablet (1,000 mcg total) by mouth daily.   No facility-administered encounter medications on file as of 02/10/2017.     Activities of Daily Living In your present state of health, do you have any difficulty performing the following activities: 02/10/2017 08/22/2016  Hearing? Y Y  Comment HOH; R ear hearing aid in progress with Medicare little- left ear   Vision? N N  Difficulty concentrating or making decisions? Y N  Comment Difficulty focusing since her father passed away 2 weeks ago -  Walking or climbing stairs? Y N  Comment Unsteady gait -  Dressing or bathing? Tempie Donning  Comment Staff assists aide  assists as needed.   Doing errands, shopping? N N  Preparing Food and eating ? Y N  Comment Meal prepared by others; self feeds -  Using the Toilet? N N  In the past six months, have you accidently leaked urine? N N  Do you  have problems with loss of bowel control? N N  Managing your Medications? N N  Managing your Finances? N N  Housekeeping or managing your Housekeeping? Y N  Comment Housekeeping assists 5 days a week -  Some recent data might be hidden    Patient Care Team: [name], MD as PCP - General (Internal Medicine)    Assessment:    This is a routine wellness examination for Lauren Bartlett. The goal of the wellness visit is to assist the patient how to close the gaps in care and create a  preventative care plan for the patient.   The roster of all physicians providing medical care to patient is listed in the Snapshot section of the chart.  Taking calcium VIT D as appropriate/Osteoporosis risk reviewed.    Safety issues reviewed; Smoke and carbon monoxide detectors in the home. No firearms in the home.  Wears seatbelts when driving or riding with others. Patient does wear sunscreen or protective clothing when in direct sunlight. No violence in the home.  Patient is alert, normal appearance, oriented to person/place/and time. Correctly identified the president of the [place], recall of 3/3 words, and performing simple calculations. Displays appropriate judgement and can read correct time from watch face.   No new identified risk were noted.  No failures at ADL's or IADL's.  Ambulates with cane/walker.  BMI- discussed the importance of a healthy diet, water intake and the benefits of aerobic exercise. Educational material provided.   Daily fluid intake: 0 cups of caffeine, 4 cups of water.  Dental- every 6 months.  Eye- Visual acuity not assessed per patient preference since they have regular follow up with the ophthalmologist.  Wears corrective lenses when reading.  Sleep patterns- Sleeps 3-4 hours at night since her father passed 2 weeks ago. Oxygen in use set at 2L.  Influenza discussed; deferred for a later date per patient preference.  Exercise Activities and Dietary  recommendations Current Exercise Habits: Home exercise routine, Type of exercise: walking, Time (Minutes): 20, Frequency (Times/Week): 4, Weekly Exercise (Minutes/Week): 80, Intensity: Mild   Fall Risk Fall Risk  02/10/2017 11/06/2016 09/04/2016 08/22/2016 02/11/2016  Falls in the past year? No No (No Data) No No  Comment - - No new falls reported today - -   Depression Screen PHQ 2/9 Scores 02/10/2017 11/06/2016 08/22/2016 02/11/2016  PHQ - 2 Score 1 0 0 0  PHQ- 9 Score 3 - - -     Cognitive Function MMSE - Mini Mental State Exam 02/10/2017 02/11/2016  Orientation to time 5 5  Orientation to Place 5 5  Registration 3 3  Attention/ Calculation 5 5  Recall 3 3  Language- name 2 objects 2 2  Language- repeat 1 1  Language- follow 3 step command 3 3  Language- read & follow direction 1 1  Write a sentence 1 1  Copy design 1 1  Total score 30 30     6CIT Screen 02/11/2016  What Year? 0 points  What month? 0 points  What time? 0 points  Count back from 20 0 points  Months in reverse 0 points    Immunization History  Administered Date(s) Administered  . Influenza Split 01/02/2012  . Influenza,inj,Quad PF,6+ Mos 03/13/2014, 01/08/2015  . Influenza-Unspecified 01/11/2013  . Pneumococcal Conjugate-13 02/27/2015  . Pneumococcal Polysaccharide-23 03/07/2013  . Tdap 11/12/2009   Screening Tests Health Maintenance  Topic Date Due  . COLONOSCOPY  12/04/2005  . INFLUENZA VACCINE  10/29/2016  . MAMMOGRAM  10/28/2018  . TETANUS/TDAP  11/13/2019  . Hepatitis C Screening  Completed  . HIV Screening  Completed  . PAP SMEAR  Discontinued      Plan:   End of life planning; Advanced aging; Advanced directives discussed.  No HCPOA/Living Will.  Additional information declined at this time.  I have personally reviewed and noted the following in the patient's chart:   . Medical and social history . Use of alcohol, tobacco or illicit drugs  . Current medications and  supplements . Functional ability and status . Nutritional status .  Physical activity . Advanced directives . List of other physicians . Hospitalizations, surgeries, and ER visits in previous 12 months . Vitals . Screenings to include cognitive, depression, and falls . Referrals and appointments  In addition, I have reviewed and discussed with patient certain preventive protocols, quality metrics, and best practice recommendations. A written personalized care plan for preventive services as well as general preventive health recommendations were provided to patient.     Varney Biles, LPN  [id number]   Reviewed above information.  Agree with assessment and plan.    Dr Nicki Reaper

## 2017-02-10 NOTE — Telephone Encounter (Signed)
Patient requests a Rx be faxed or written to [facility] for a new roll aid walker.   She believes she received the last one about 6 years ago and states the wheels are now coming off.  New insurance coverage is [employer].   Follow up scheduled with PCP 02/2017.

## 2017-02-11 ENCOUNTER — Telehealth: Payer: Self-pay | Admitting: Internal Medicine

## 2017-02-11 DIAGNOSIS — J9611 Chronic respiratory failure with hypoxia: Secondary | ICD-10-CM | POA: Diagnosis not present

## 2017-02-11 NOTE — Telephone Encounter (Signed)
Is it okay to print off the DME for a new rollator.

## 2017-02-11 NOTE — Telephone Encounter (Signed)
Spoke with pt to let her know that Dr. Derrel Nip is out of the office this afternoon but that we did send her the message about getting an order for the rollater sent to [facility].

## 2017-02-11 NOTE — Telephone Encounter (Signed)
Copied from [place] [phone]. Topic: Quick Communication - See Telephone Encounter >> Feb 11, 2017  3:28 PM Robina Ade, Helene Kelp D wrote: CRM for notification. See Telephone encounter for:  Patient was calling to see if a referral for [place] was made already. She said that when she came to her visit yesterday she asked about it. Please call patient back, thanks. 02/11/17.

## 2017-02-11 NOTE — Telephone Encounter (Signed)
Please advise 

## 2017-02-12 DIAGNOSIS — J9611 Chronic respiratory failure with hypoxia: Secondary | ICD-10-CM | POA: Diagnosis not present

## 2017-02-12 NOTE — Telephone Encounter (Signed)
Order for rollator has been faxed. Pt is aware.

## 2017-02-12 NOTE — Telephone Encounter (Signed)
Yes,  DME written

## 2017-02-13 DIAGNOSIS — J9611 Chronic respiratory failure with hypoxia: Secondary | ICD-10-CM | POA: Diagnosis not present

## 2017-02-16 DIAGNOSIS — M5430 Sciatica, unspecified side: Secondary | ICD-10-CM | POA: Diagnosis not present

## 2017-02-16 DIAGNOSIS — R2689 Other abnormalities of gait and mobility: Secondary | ICD-10-CM | POA: Diagnosis not present

## 2017-02-16 DIAGNOSIS — J9611 Chronic respiratory failure with hypoxia: Secondary | ICD-10-CM | POA: Diagnosis not present

## 2017-02-17 DIAGNOSIS — J9611 Chronic respiratory failure with hypoxia: Secondary | ICD-10-CM | POA: Diagnosis not present

## 2017-02-18 DIAGNOSIS — J9611 Chronic respiratory failure with hypoxia: Secondary | ICD-10-CM | POA: Diagnosis not present

## 2017-02-19 DIAGNOSIS — J9611 Chronic respiratory failure with hypoxia: Secondary | ICD-10-CM | POA: Diagnosis not present

## 2017-02-20 DIAGNOSIS — J9611 Chronic respiratory failure with hypoxia: Secondary | ICD-10-CM | POA: Diagnosis not present

## 2017-02-23 DIAGNOSIS — J9611 Chronic respiratory failure with hypoxia: Secondary | ICD-10-CM | POA: Diagnosis not present

## 2017-02-24 DIAGNOSIS — J9611 Chronic respiratory failure with hypoxia: Secondary | ICD-10-CM | POA: Diagnosis not present

## 2017-02-25 DIAGNOSIS — J9611 Chronic respiratory failure with hypoxia: Secondary | ICD-10-CM | POA: Diagnosis not present

## 2017-02-26 DIAGNOSIS — J9611 Chronic respiratory failure with hypoxia: Secondary | ICD-10-CM | POA: Diagnosis not present

## 2017-02-27 DIAGNOSIS — J9611 Chronic respiratory failure with hypoxia: Secondary | ICD-10-CM | POA: Diagnosis not present

## 2017-03-02 DIAGNOSIS — J9611 Chronic respiratory failure with hypoxia: Secondary | ICD-10-CM | POA: Diagnosis not present

## 2017-03-03 DIAGNOSIS — J9611 Chronic respiratory failure with hypoxia: Secondary | ICD-10-CM | POA: Diagnosis not present

## 2017-03-04 DIAGNOSIS — J9611 Chronic respiratory failure with hypoxia: Secondary | ICD-10-CM | POA: Diagnosis not present

## 2017-03-05 DIAGNOSIS — J9611 Chronic respiratory failure with hypoxia: Secondary | ICD-10-CM | POA: Diagnosis not present

## 2017-03-06 DIAGNOSIS — J9611 Chronic respiratory failure with hypoxia: Secondary | ICD-10-CM | POA: Diagnosis not present

## 2017-03-07 DIAGNOSIS — J449 Chronic obstructive pulmonary disease, unspecified: Secondary | ICD-10-CM | POA: Diagnosis not present

## 2017-03-10 DIAGNOSIS — J9611 Chronic respiratory failure with hypoxia: Secondary | ICD-10-CM | POA: Diagnosis not present

## 2017-03-11 DIAGNOSIS — J9611 Chronic respiratory failure with hypoxia: Secondary | ICD-10-CM | POA: Diagnosis not present

## 2017-03-12 DIAGNOSIS — J9611 Chronic respiratory failure with hypoxia: Secondary | ICD-10-CM | POA: Diagnosis not present

## 2017-03-13 DIAGNOSIS — J9611 Chronic respiratory failure with hypoxia: Secondary | ICD-10-CM | POA: Diagnosis not present

## 2017-03-16 DIAGNOSIS — J9611 Chronic respiratory failure with hypoxia: Secondary | ICD-10-CM | POA: Diagnosis not present

## 2017-03-17 DIAGNOSIS — J9611 Chronic respiratory failure with hypoxia: Secondary | ICD-10-CM | POA: Diagnosis not present

## 2017-03-18 DIAGNOSIS — J9611 Chronic respiratory failure with hypoxia: Secondary | ICD-10-CM | POA: Diagnosis not present

## 2017-03-19 ENCOUNTER — Ambulatory Visit: Payer: Medicare Other | Admitting: Internal Medicine

## 2017-03-19 DIAGNOSIS — J9611 Chronic respiratory failure with hypoxia: Secondary | ICD-10-CM | POA: Diagnosis not present

## 2017-03-20 DIAGNOSIS — J9611 Chronic respiratory failure with hypoxia: Secondary | ICD-10-CM | POA: Diagnosis not present

## 2017-03-23 DIAGNOSIS — J9611 Chronic respiratory failure with hypoxia: Secondary | ICD-10-CM | POA: Diagnosis not present

## 2017-03-25 DIAGNOSIS — J9611 Chronic respiratory failure with hypoxia: Secondary | ICD-10-CM | POA: Diagnosis not present

## 2017-03-26 ENCOUNTER — Ambulatory Visit: Payer: Medicare HMO | Attending: Pulmonary Disease

## 2017-03-26 DIAGNOSIS — J9611 Chronic respiratory failure with hypoxia: Secondary | ICD-10-CM | POA: Diagnosis not present

## 2017-03-26 DIAGNOSIS — J449 Chronic obstructive pulmonary disease, unspecified: Secondary | ICD-10-CM | POA: Diagnosis not present

## 2017-03-26 DIAGNOSIS — J42 Unspecified chronic bronchitis: Secondary | ICD-10-CM

## 2017-03-26 MED ORDER — ALBUTEROL SULFATE (2.5 MG/3ML) 0.083% IN NEBU
2.5000 mg | INHALATION_SOLUTION | Freq: Once | RESPIRATORY_TRACT | Status: AC
  Administered 2017-03-26: 2.5 mg via RESPIRATORY_TRACT
  Filled 2017-03-26: qty 3

## 2017-03-27 DIAGNOSIS — J9611 Chronic respiratory failure with hypoxia: Secondary | ICD-10-CM | POA: Diagnosis not present

## 2017-03-30 DIAGNOSIS — J9611 Chronic respiratory failure with hypoxia: Secondary | ICD-10-CM | POA: Diagnosis not present

## 2017-03-31 DIAGNOSIS — J9611 Chronic respiratory failure with hypoxia: Secondary | ICD-10-CM | POA: Diagnosis not present

## 2017-04-01 DIAGNOSIS — J9611 Chronic respiratory failure with hypoxia: Secondary | ICD-10-CM | POA: Diagnosis not present

## 2017-04-02 ENCOUNTER — Other Ambulatory Visit: Payer: Self-pay | Admitting: Internal Medicine

## 2017-04-02 DIAGNOSIS — R42 Dizziness and giddiness: Secondary | ICD-10-CM

## 2017-04-02 DIAGNOSIS — J9611 Chronic respiratory failure with hypoxia: Secondary | ICD-10-CM | POA: Diagnosis not present

## 2017-04-02 DIAGNOSIS — I5032 Chronic diastolic (congestive) heart failure: Secondary | ICD-10-CM

## 2017-04-03 DIAGNOSIS — J9611 Chronic respiratory failure with hypoxia: Secondary | ICD-10-CM | POA: Diagnosis not present

## 2017-04-06 DIAGNOSIS — J9611 Chronic respiratory failure with hypoxia: Secondary | ICD-10-CM | POA: Diagnosis not present

## 2017-04-07 DIAGNOSIS — J449 Chronic obstructive pulmonary disease, unspecified: Secondary | ICD-10-CM | POA: Diagnosis not present

## 2017-04-07 DIAGNOSIS — J9611 Chronic respiratory failure with hypoxia: Secondary | ICD-10-CM | POA: Diagnosis not present

## 2017-04-08 DIAGNOSIS — J9611 Chronic respiratory failure with hypoxia: Secondary | ICD-10-CM | POA: Diagnosis not present

## 2017-04-09 DIAGNOSIS — J9611 Chronic respiratory failure with hypoxia: Secondary | ICD-10-CM | POA: Diagnosis not present

## 2017-04-10 ENCOUNTER — Ambulatory Visit: Payer: Medicare HMO | Admitting: Pulmonary Disease

## 2017-04-10 DIAGNOSIS — J9611 Chronic respiratory failure with hypoxia: Secondary | ICD-10-CM | POA: Diagnosis not present

## 2017-04-13 DIAGNOSIS — J9611 Chronic respiratory failure with hypoxia: Secondary | ICD-10-CM | POA: Diagnosis not present

## 2017-04-16 DIAGNOSIS — J9611 Chronic respiratory failure with hypoxia: Secondary | ICD-10-CM | POA: Diagnosis not present

## 2017-04-17 DIAGNOSIS — J9611 Chronic respiratory failure with hypoxia: Secondary | ICD-10-CM | POA: Diagnosis not present

## 2017-04-20 DIAGNOSIS — J9611 Chronic respiratory failure with hypoxia: Secondary | ICD-10-CM | POA: Diagnosis not present

## 2017-04-21 ENCOUNTER — Telehealth: Payer: Self-pay | Admitting: Internal Medicine

## 2017-04-21 DIAGNOSIS — J9611 Chronic respiratory failure with hypoxia: Secondary | ICD-10-CM | POA: Diagnosis not present

## 2017-04-21 NOTE — Telephone Encounter (Signed)
Copied from [place]. Topic: Inquiry >> Apr 21, 2017  8:10 AM Ether Griffins B wrote: Reason for CRM: pt had an appt for 04/22/17 but canceled due to the possibility of freezing rain. She is needing Dr. Derrel Nip to fill out a letter about her food trays she has sent to her house through the  insurance company. She is wanting to know if she should mail the form in or what she needs to do to get Dr. Derrel Nip to fill out her form. Her appt was rescheduled but it isn't until March

## 2017-04-22 ENCOUNTER — Ambulatory Visit: Payer: Medicare HMO | Admitting: Internal Medicine

## 2017-04-22 DIAGNOSIS — J9611 Chronic respiratory failure with hypoxia: Secondary | ICD-10-CM | POA: Diagnosis not present

## 2017-04-22 NOTE — Telephone Encounter (Signed)
LMTCB. Need to let pt know that it is okay for her to mail those forms into [place] so that Dr. Derrel Nip can get them filled out.   Address is:   [address] [place] [address] [place] [address]

## 2017-04-23 DIAGNOSIS — J9611 Chronic respiratory failure with hypoxia: Secondary | ICD-10-CM | POA: Diagnosis not present

## 2017-04-24 ENCOUNTER — Ambulatory Visit (INDEPENDENT_AMBULATORY_CARE_PROVIDER_SITE_OTHER): Payer: Medicare HMO | Admitting: Pulmonary Disease

## 2017-04-24 ENCOUNTER — Encounter: Payer: Self-pay | Admitting: Pulmonary Disease

## 2017-04-24 VITALS — BP 94/68 | HR 81 | Ht 62.0 in | Wt 228.0 lb

## 2017-04-24 DIAGNOSIS — G4734 Idiopathic sleep related nonobstructive alveolar hypoventilation: Secondary | ICD-10-CM | POA: Diagnosis not present

## 2017-04-24 DIAGNOSIS — J9611 Chronic respiratory failure with hypoxia: Secondary | ICD-10-CM | POA: Diagnosis not present

## 2017-04-24 DIAGNOSIS — J449 Chronic obstructive pulmonary disease, unspecified: Secondary | ICD-10-CM

## 2017-04-24 NOTE — Patient Instructions (Signed)
Continue Anoro inhaler Continue albuterol inhaler as needed Trial of Arnuity inhaler - sample provided.  Use of the sample and then go off of it.  If you believe this inhaler is beneficial, call here and we will change her prescription  Follow-up in 3-4 months

## 2017-04-27 DIAGNOSIS — J9611 Chronic respiratory failure with hypoxia: Secondary | ICD-10-CM | POA: Diagnosis not present

## 2017-04-28 DIAGNOSIS — J9611 Chronic respiratory failure with hypoxia: Secondary | ICD-10-CM | POA: Diagnosis not present

## 2017-04-28 NOTE — Telephone Encounter (Signed)
LMTCB. Need to let pt know that it is okay for her to mail those forms into [place] so that Dr. Derrel Nip can get them filled out.   Address is:   [address] [place] [address]  [place] may speak with pt.

## 2017-04-28 NOTE — Telephone Encounter (Signed)
Pt states she mailed the forms last week . Possibly wed. Please advise if you do not get in a couple days.

## 2017-04-29 DIAGNOSIS — J9611 Chronic respiratory failure with hypoxia: Secondary | ICD-10-CM | POA: Diagnosis not present

## 2017-04-30 DIAGNOSIS — J9611 Chronic respiratory failure with hypoxia: Secondary | ICD-10-CM | POA: Diagnosis not present

## 2017-04-30 NOTE — Telephone Encounter (Signed)
Paperwork has been completed and faxed. Original copy has been mailed back to pt for her records. Pt is aware.

## 2017-05-01 DIAGNOSIS — J9611 Chronic respiratory failure with hypoxia: Secondary | ICD-10-CM | POA: Diagnosis not present

## 2017-05-01 NOTE — Progress Notes (Signed)
PULMONARY OFFICE FOLLOW UP NOTE  Requesting MD/Service: Derrel Nip  Date of initial consultation: 03/07/16  Reason for consultation: tracheobronchomalacia, DOE  PT PROFILE: 62 y.o. F never smoker with significant lifetime exposure to second hand smoke with multiple medical problems including chronic hypoxic respiratory failure, moderate COPD, mitral valve prolapse and regurgitation, pulm htn by echocardiogram, appearance of tracheomalacia on CT chest previously followed by Dr Raul Del and referred for further evaluation of severe exertional limitation. Initial eval suggested severe mitral regurgitation and impression was that this was a major contributor to her severe DOE  DATA: Echocardiogram 02/01/13: Normal LVEF. MVP with moderate MR, Moderate AI, severe TI. RVSP est 91 mmHg CT chest 07/29/15: Cardiomegaly with left atrial dilatation. Tracheobronchomalacia. Air trapping in the lungs bilaterally, indicative of small airways disease Echocardiogram 06/13/14: Normal LVEF, Severe MR, RVSP est 62 mmHg Spirometry 03/12/15: FVC 1.94 L (73% pred). FEV1 1.19 L (55% pred), FEV1/FVC 61% 6MWT 03/12/15: 225 meters, no desaturation LE venous US 07/29/15: No DVT Echocardiogram 07/31/15: LVEF 60-65%. MV poorly visualized, trivial MR, LA size normal, TV poorly visualized, RVSP not estimated CT chest 03/11/16: Stable changes of tracheobronchomalacia Echocardiogram 04/15/16: Normal LVEF, Severe MR, moderate PAH Mitral valvuloplasty 07/17/16: @ [facility] by Dr Roxy Manns Echocardiogram 09/15/16: LVEF 60-65%. RVSP estimate 54 mmHg ONO 10/21/16: on RA. 45 mins < 89%, lowest SpO2 84% PFTs 03/26/17: FVC: 1.73 > 1.74 L (63 %pred), FEV1: 1.01 > 1.16 L (45> 52%pred, 15% improvement after bronchodilator), FEV1/FVC: 59 >67%, TLC: 3.27 L (73 %pred), DLCO 46 %pred, DLCO/VA 103%   INTERVAL:  Last seen 10/06/16.  No major pulmonary problems since that time.  SUBJ::  This is a routine reevaluation.  She has no new complaints.  She  continues to have minimal exertional dyspnea.. Denies CP, fever, purulent sputum, hemoptysis, LE edema and calf tenderness. She is still wearing O2 with sleep and exertion.   OBJ:  Vitals:   04/24/17 1154 04/24/17 1157  BP:  94/68  Pulse:  81  SpO2:  95%  Weight: 103.4 kg (228 lb)   Height: 5\' 2"  (1.575 m)   RA   EXAM:  Gen: NAD HEENT: WNL Neck: No JVD noted Lungs: BS mildly diminished without wheezes Cardiovascular: Reg, no M noted Abdomen: Soft, nontender, normal BS Ext: No C/C/E Neuro: Limited exam, no focal deficits  DATA:   BMP Latest Ref Rng & Units 11/06/2016 10/20/2016 09/17/2016  Glucose 70 - 99 mg/dL 87 105(H) 80  BUN 6 - 23 mg/dL 16 27(H) 18  Creatinine 0.40 - 1.20 mg/dL 0.91 0.92 0.87  BUN/Creat Ratio 12 - 28 - - -  Sodium 135 - 145 mEq/L 141 140 141  Potassium 3.5 - 5.1 mEq/L 3.9 4.2 4.0  Chloride 96 - 112 mEq/L 101 101 101  CO2 19 - 32 mEq/L 34(H) 29 31  Calcium 8.4 - 10.5 mg/dL 8.8 8.3(L) 8.6    CBC Latest Ref Rng & Units 10/20/2016 08/13/2016 07/21/2016  WBC 3.6 - 11.0 K/uL 9.5 8.6 10.9(H)  Hemoglobin 12.0 - 16.0 g/dL 13.7 10.4(L) 9.0(L)  Hematocrit 35.0 - 47.0 % 42.3 31.7(L) 28.0(L)  Platelets 150 - 440 K/uL 200 225 122(L)    CXR: NNF  IMPRESSION:     ICD-10-CM   1. Moderate to severe COPD with reversible component J44.9   2. Nocturnal hypoxemia G47.34    Despite the fact that she describes minimal or no exertional dyspnea, her PFTs in fact reveal quite significant obstructive lung disease with substantial reversible component.  She assures me  that she is using the Anoro inhaler as prescribed.  She never uses albuterol because she never has significant symptoms to warrant albuterol use.  There is a significant reversible component demonstrated on PFTs, she might benefit from the addition of an inhaled steroid.  PLAN:  Continue Anoro inhaler Continue albuterol inhaler as needed Arnuity inhaler sample provided.  She is to use this sample on a daily  basis and then go off of it for period of time.  If she believes that her respiratory status improved with the addition of the inhaled steroid, we can change the Anoro inhaler to Trelegy  Follow-up in 3-4 months   Merton Border, MD PCCM service Mobile [phone] Pager [phone] 05/01/2017 5:21 PM

## 2017-05-04 DIAGNOSIS — J9611 Chronic respiratory failure with hypoxia: Secondary | ICD-10-CM | POA: Diagnosis not present

## 2017-05-05 DIAGNOSIS — J9611 Chronic respiratory failure with hypoxia: Secondary | ICD-10-CM | POA: Diagnosis not present

## 2017-05-06 DIAGNOSIS — J9611 Chronic respiratory failure with hypoxia: Secondary | ICD-10-CM | POA: Diagnosis not present

## 2017-05-07 DIAGNOSIS — J9611 Chronic respiratory failure with hypoxia: Secondary | ICD-10-CM | POA: Diagnosis not present

## 2017-05-08 DIAGNOSIS — J449 Chronic obstructive pulmonary disease, unspecified: Secondary | ICD-10-CM | POA: Diagnosis not present

## 2017-05-08 DIAGNOSIS — J9611 Chronic respiratory failure with hypoxia: Secondary | ICD-10-CM | POA: Diagnosis not present

## 2017-05-11 DIAGNOSIS — J9611 Chronic respiratory failure with hypoxia: Secondary | ICD-10-CM | POA: Diagnosis not present

## 2017-05-12 DIAGNOSIS — J9611 Chronic respiratory failure with hypoxia: Secondary | ICD-10-CM | POA: Diagnosis not present

## 2017-05-13 DIAGNOSIS — J9611 Chronic respiratory failure with hypoxia: Secondary | ICD-10-CM | POA: Diagnosis not present

## 2017-05-14 DIAGNOSIS — J9611 Chronic respiratory failure with hypoxia: Secondary | ICD-10-CM | POA: Diagnosis not present

## 2017-05-15 DIAGNOSIS — J9611 Chronic respiratory failure with hypoxia: Secondary | ICD-10-CM | POA: Diagnosis not present

## 2017-05-18 DIAGNOSIS — J9611 Chronic respiratory failure with hypoxia: Secondary | ICD-10-CM | POA: Diagnosis not present

## 2017-05-19 DIAGNOSIS — J9611 Chronic respiratory failure with hypoxia: Secondary | ICD-10-CM | POA: Diagnosis not present

## 2017-05-20 DIAGNOSIS — J9611 Chronic respiratory failure with hypoxia: Secondary | ICD-10-CM | POA: Diagnosis not present

## 2017-05-21 DIAGNOSIS — J9611 Chronic respiratory failure with hypoxia: Secondary | ICD-10-CM | POA: Diagnosis not present

## 2017-05-22 DIAGNOSIS — J9611 Chronic respiratory failure with hypoxia: Secondary | ICD-10-CM | POA: Diagnosis not present

## 2017-05-25 DIAGNOSIS — J9611 Chronic respiratory failure with hypoxia: Secondary | ICD-10-CM | POA: Diagnosis not present

## 2017-05-26 DIAGNOSIS — J9611 Chronic respiratory failure with hypoxia: Secondary | ICD-10-CM | POA: Diagnosis not present

## 2017-05-27 DIAGNOSIS — J9611 Chronic respiratory failure with hypoxia: Secondary | ICD-10-CM | POA: Diagnosis not present

## 2017-05-28 DIAGNOSIS — J9611 Chronic respiratory failure with hypoxia: Secondary | ICD-10-CM | POA: Diagnosis not present

## 2017-05-29 DIAGNOSIS — J9611 Chronic respiratory failure with hypoxia: Secondary | ICD-10-CM | POA: Diagnosis not present

## 2017-06-01 DIAGNOSIS — J9611 Chronic respiratory failure with hypoxia: Secondary | ICD-10-CM | POA: Diagnosis not present

## 2017-06-02 DIAGNOSIS — J9611 Chronic respiratory failure with hypoxia: Secondary | ICD-10-CM | POA: Diagnosis not present

## 2017-06-03 DIAGNOSIS — J9611 Chronic respiratory failure with hypoxia: Secondary | ICD-10-CM | POA: Diagnosis not present

## 2017-06-04 DIAGNOSIS — J9611 Chronic respiratory failure with hypoxia: Secondary | ICD-10-CM | POA: Diagnosis not present

## 2017-06-05 DIAGNOSIS — J9611 Chronic respiratory failure with hypoxia: Secondary | ICD-10-CM | POA: Diagnosis not present

## 2017-06-05 DIAGNOSIS — J449 Chronic obstructive pulmonary disease, unspecified: Secondary | ICD-10-CM | POA: Diagnosis not present

## 2017-06-08 DIAGNOSIS — J9611 Chronic respiratory failure with hypoxia: Secondary | ICD-10-CM | POA: Diagnosis not present

## 2017-06-09 ENCOUNTER — Ambulatory Visit: Payer: Medicare HMO | Admitting: Internal Medicine

## 2017-06-09 DIAGNOSIS — J9611 Chronic respiratory failure with hypoxia: Secondary | ICD-10-CM | POA: Diagnosis not present

## 2017-06-10 DIAGNOSIS — J9611 Chronic respiratory failure with hypoxia: Secondary | ICD-10-CM | POA: Diagnosis not present

## 2017-06-11 DIAGNOSIS — J9611 Chronic respiratory failure with hypoxia: Secondary | ICD-10-CM | POA: Diagnosis not present

## 2017-06-12 DIAGNOSIS — J9611 Chronic respiratory failure with hypoxia: Secondary | ICD-10-CM | POA: Diagnosis not present

## 2017-06-15 DIAGNOSIS — J9611 Chronic respiratory failure with hypoxia: Secondary | ICD-10-CM | POA: Diagnosis not present

## 2017-06-16 DIAGNOSIS — J9611 Chronic respiratory failure with hypoxia: Secondary | ICD-10-CM | POA: Diagnosis not present

## 2017-06-17 DIAGNOSIS — J9611 Chronic respiratory failure with hypoxia: Secondary | ICD-10-CM | POA: Diagnosis not present

## 2017-06-18 ENCOUNTER — Telehealth: Payer: Self-pay

## 2017-06-18 DIAGNOSIS — J9611 Chronic respiratory failure with hypoxia: Secondary | ICD-10-CM | POA: Diagnosis not present

## 2017-06-18 NOTE — Telephone Encounter (Signed)
Called patient and made aware she could cancel upcoming appointment.

## 2017-06-18 NOTE — Telephone Encounter (Signed)
-----   Message from Rexene Alberts, MD sent at 06/18/2017  2:46 PM EDT ----- That's fine  ----- Message ----- From: Donnella Sham, RN Sent: 06/18/2017  11:07 AM To: Rexene Alberts, MD  She is requesting to cancel her next appointment with you on 07/27/2017 and just see her Cardiologist in [place] because of transportation issues.  Can she cancel or does she need to make it in?  Thanks,  Caryl Pina

## 2017-06-19 DIAGNOSIS — J9611 Chronic respiratory failure with hypoxia: Secondary | ICD-10-CM | POA: Diagnosis not present

## 2017-06-22 DIAGNOSIS — J9611 Chronic respiratory failure with hypoxia: Secondary | ICD-10-CM | POA: Diagnosis not present

## 2017-06-23 DIAGNOSIS — J9611 Chronic respiratory failure with hypoxia: Secondary | ICD-10-CM | POA: Diagnosis not present

## 2017-06-24 DIAGNOSIS — J9611 Chronic respiratory failure with hypoxia: Secondary | ICD-10-CM | POA: Diagnosis not present

## 2017-06-25 DIAGNOSIS — J9611 Chronic respiratory failure with hypoxia: Secondary | ICD-10-CM | POA: Diagnosis not present

## 2017-06-26 DIAGNOSIS — J9611 Chronic respiratory failure with hypoxia: Secondary | ICD-10-CM | POA: Diagnosis not present

## 2017-06-29 DIAGNOSIS — J9611 Chronic respiratory failure with hypoxia: Secondary | ICD-10-CM | POA: Diagnosis not present

## 2017-06-30 DIAGNOSIS — J9611 Chronic respiratory failure with hypoxia: Secondary | ICD-10-CM | POA: Diagnosis not present

## 2017-07-01 DIAGNOSIS — J9611 Chronic respiratory failure with hypoxia: Secondary | ICD-10-CM | POA: Diagnosis not present

## 2017-07-02 DIAGNOSIS — J9611 Chronic respiratory failure with hypoxia: Secondary | ICD-10-CM | POA: Diagnosis not present

## 2017-07-03 ENCOUNTER — Ambulatory Visit (INDEPENDENT_AMBULATORY_CARE_PROVIDER_SITE_OTHER): Payer: Medicare HMO | Admitting: Cardiovascular Disease

## 2017-07-03 ENCOUNTER — Encounter: Payer: Self-pay | Admitting: Cardiovascular Disease

## 2017-07-03 VITALS — BP 110/68 | HR 75 | Ht 62.0 in | Wt 236.0 lb

## 2017-07-03 DIAGNOSIS — Z9889 Other specified postprocedural states: Secondary | ICD-10-CM

## 2017-07-03 DIAGNOSIS — J9611 Chronic respiratory failure with hypoxia: Secondary | ICD-10-CM | POA: Diagnosis not present

## 2017-07-03 DIAGNOSIS — I10 Essential (primary) hypertension: Secondary | ICD-10-CM

## 2017-07-03 NOTE — Progress Notes (Signed)
Cardiology Office Note   Date:  07/07/2017   ID:  Lauren Bartlett, DOB [birth year], MRN [id number]  PCP:  [name], MD  Cardiologist: Fletcher Anon  Chief Complaint  Patient presents with  . Other    Past due 6 month follow up. Patient c/o SOB but thinks it maybe from her congestion. Patient last seen 08/2016.       History of Present Illness: Lauren Bartlett is a 62 y.o. female who is here today for a follow-up visit regarding mitral regurgitation. The patient has chronic medical conditions that include COPD, mitral valve prolapse, essential hypertension, anxiety and depression. She is a lifelong nonsmoker but reports being exposed to smoking. She underwent successful mitral valve repair and PFO closure in April of 2018. She had postoperative atrial fibrillation that was treated with amiodarone. She was discharged home on warfarin which was subsequently discontinued after she developed epistaxis.  Most recent echocardiogram in 08/2016 showed normal LV systolic function, intact mitral valve repair, mild to moderate tricuspid regurgitation with moderate pulmonary hypertension. Systolic pulmonary pressure was 54 mmHg.  She has been depressed lately after the death of her father and gained significant amount of weight.No chest pain or shortness of breath.  Past Medical History:  Diagnosis Date  . Anxiety   . Bleeding from the nose   . Chronic diastolic congestive heart failure ([place])    a. 03/2016 Echo: EF 55-60%, Gr2 DD.  [name] Congenital absence of one kidney   . COPD (chronic obstructive pulmonary disease) ([place])   . Depression    treated at [facility]  . Dyspnea   . Emphysema/COPD ([place])   . GERD (gastroesophageal reflux disease)   . History of sciatica   . Hypertension   . Hypothyroidism   . Mitral Valve Prolapse & Severe mitral regurgitation s/p MVR    a. 03/2016 Echo: severe MVP involving the posterior leaflet, sev MR;  b. 06/2016 minimally invasive MV repair w/ triangular  resection of flail segment (P2) of posterior leaflet, gore-tex neochord placement x 4, Sorin Memo 3D rin Annulosplasty (6mm, catalog # [id number], ser # [id number]).  . Motion sickness    cars  . Pneumonia   . PONV (postoperative nausea and vomiting)   . Post-op Afib    a. 06/2016 following MV repair-->amio (dose reduced 2/2 QT prolongation), coumadin.  . Pulmonary hypertension ([place])   . S/P patent foramen ovale closure    a. 06/2016 @ time of MV Repair.  . Sleep apnea    O2 at night and PRN    Past Surgical History:  Procedure Laterality Date  . ABDOMINAL HYSTERECTOMY    . BREAST BIOPSY Right 2011   [facility]< benign  . CATARACT EXTRACTION W/PHACO Left 11/14/2015   Procedure: CATARACT EXTRACTION PHACO AND INTRAOCULAR LENS PLACEMENT (IOC);  Surgeon: Leandrew Koyanagi, MD;  Location: [facility];  Service: Ophthalmology;  Laterality: Left;  sleep apnea [place]  . CATARACT EXTRACTION W/PHACO Right 12/19/2015   Procedure: CATARACT EXTRACTION PHACO AND INTRAOCULAR LENS PLACEMENT (IOC);  Surgeon: Leandrew Koyanagi, MD;  Location: [facility];  Service: Ophthalmology;  Laterality: Right;  ANXIETY GENEROUS IV SEDATION TORIC LEN  . COMBINED HYSTERECTOMY ABDOMINAL W/ A&P REPAIR / OOPHORECTOMY  1996   benign tumor  . INNER EAR SURGERY     bilateral  . MITRAL VALVE REPAIR Right 07/17/2016   Procedure: MINIMALLY INVASIVE MITRAL VALVE REPAIR (MVR) USING 26 SORIN MEMO 3D ANNULOPLASTY RING;  Surgeon: Rexene Alberts, MD;  Location:  [facility];  Service: Open Heart Surgery;  Laterality: Right;  . PATENT FORAMEN OVALE(PFO) CLOSURE N/A 07/17/2016   Procedure: PATENT FORAMEN OVALE (PFO) CLOSURE;  Surgeon: Rexene Alberts, MD;  Location: [facility];  Service: Open Heart Surgery;  Laterality: N/A;  . RIGHT/LEFT HEART CATH AND CORONARY ANGIOGRAPHY Bilateral 06/16/2016   Procedure: Right/Left Heart Cath and Coronary Angiography;  Surgeon: Wellington Hampshire, MD;  Location: [facility];  Service: Cardiovascular;   Laterality: Bilateral;  . TEE WITHOUT CARDIOVERSION N/A 05/19/2016   Procedure: TRANSESOPHAGEAL ECHOCARDIOGRAM (TEE);  Surgeon: Wellington Hampshire, MD;  Location: [facility];  Service: Cardiovascular;  Laterality: N/A;  . TEE WITHOUT CARDIOVERSION N/A 06/09/2016   Procedure: TRANSESOPHAGEAL ECHOCARDIOGRAM (TEE);  Surgeon: Wellington Hampshire, MD;  Location: [facility];  Service: Cardiovascular;  Laterality: N/A;  . TEE WITHOUT CARDIOVERSION N/A 06/16/2016   Procedure: TRANSESOPHAGEAL ECHOCARDIOGRAM (TEE);  Surgeon: Wellington Hampshire, MD;  Location: [facility];  Service: Cardiovascular;  Laterality: N/A;  . TEE WITHOUT CARDIOVERSION N/A 07/17/2016   Procedure: TRANSESOPHAGEAL ECHOCARDIOGRAM (TEE);  Surgeon: Rexene Alberts, MD;  Location: [facility];  Service: Open Heart Surgery;  Laterality: N/A;  . TONSILLECTOMY       Current Outpatient Medications  Medication Sig Dispense Refill  . acetaminophen (TYLENOL) 500 MG tablet Take 500 mg by mouth 2 (two) times daily as needed for moderate pain or headache.    . albuterol (PROVENTIL HFA;VENTOLIN HFA) 108 (90 Base) MCG/ACT inhaler Inhale 2 puffs into the lungs every 6 (six) hours as needed for wheezing or shortness of breath. Pt uses with SPACER. 1 Inhaler 2  . albuterol (PROVENTIL) (2.5 MG/3ML) 0.083% nebulizer solution Take 2.5 mg by nebulization every 6 (six) hours as needed for wheezing or shortness of breath.    [name] aspirin EC 81 MG EC tablet Take 1 tablet (81 mg total) by mouth daily.    . ergocalciferol (DRISDOL) 50000 units capsule Take 1 capsule (50,000 Units total) by mouth once a week. 4 capsule 11  . famotidine (PEPCID AC) 10 MG chewable tablet Chew 10 mg by mouth daily as needed for heartburn.    . ferrous sulfate 325 (65 FE) MG tablet Take 325 mg by mouth daily with breakfast.    . fluticasone (FLONASE) 50 MCG/ACT nasal spray Place 2 sprays into both nostrils daily. 16 g 2  . furosemide (LASIX) 20 MG tablet TAKE 1 TABLET EVERY DAY 90 tablet 0  .  Hypromellose (ISOPTO TEARS) 0.5 % SOLN Apply 1 drop to eye 2 (two) times daily.    [name] ipratropium (ATROVENT) 0.03 % nasal spray USE 2 SPRAYS NASALLY THREE TIMES DAILY 90 mL 0  . levothyroxine (SYNTHROID, LEVOTHROID) 125 MCG tablet TAKE 1 TABLET EVERY DAY BEFORE BREAKFAST 90 tablet 0  . loratadine-pseudoephedrine (CLARITIN-D 24-HOUR) 10-240 MG 24 hr tablet Take 1 tablet by mouth daily.    [name] LORazepam (ATIVAN) 0.5 MG tablet Take 1 tablet (0.5 mg total) by mouth every 8 (eight) hours as needed for anxiety. 30 tablet 0  . losartan (COZAAR) 50 MG tablet TAKE 1 TABLET EVERY DAY 90 tablet 0  . OXYGEN Inhale 2 L into the lungs at bedtime as needed (at bedtime and as needed).    [name] oxymetazoline (AFRIN) 0.05 % nasal spray Place 1 spray into both nostrils 2 (two) times daily. As needed for nose bleed    . polyethylene glycol powder (GLYCOLAX/MIRALAX) powder Take 17 g by mouth 2 (two) times daily as needed. (Patient taking differently:  Take 17 g by mouth daily as needed for mild constipation. ) 3350 g 1  . traMADol (ULTRAM) 50 MG tablet Take 1-2 tablets (50-100 mg total) by mouth every 4 (four) hours as needed for moderate pain. 30 tablet 0  . umeclidinium-vilanterol (ANORO ELLIPTA) 62.5-25 MCG/INH AEPB Inhale 1 puff into the lungs daily. 1 each 11  . vitamin B-12 (CYANOCOBALAMIN) 1000 MCG tablet Take 1 tablet (1,000 mcg total) by mouth daily. 90 tablet 3   No current facility-administered medications for this visit.     Allergies:   Codeine; Levaquin [levofloxacin]; Penicillins; Zithromax [azithromycin]; and Prednisone    Social History:  The patient  reports that she has never smoked. She has never used smokeless tobacco. She reports that she does not drink alcohol or use drugs.   Family History:  The patient's family history includes Breast cancer in her paternal aunt; Cancer in her brother; Coronary artery disease in her father; Heart disease in her brother and father; Hypertension in her father and  mother; Multiple sclerosis in her mother.    ROS:  Please see the history of present illness.   Otherwise, review of systems are positive for none.   All other systems are reviewed and negative.    PHYSICAL EXAM: VS:  BP 110/68 (BP Location: Right Arm, Patient Position: Sitting, Cuff Size: Normal)   Pulse 75   Ht 5\' 2"  (1.575 m)   Wt 236 lb (107 kg)   BMI 43.16 kg/m  , BMI Body mass index is 43.16 kg/m. GEN: Well nourished, well developed, in no acute distress  HEENT: normal  Neck: no JVD, carotid bruits, or masses Cardiac: RRR; no rubs, or gallops,no edema . No heart murmurs.  Respiratory:  Mild expiratory wheezing, normal work of breathing GI: soft, nontender, nondistended, + BS MS: no deformity or atrophy  Skin: warm and dry, no rash Neuro:  Strength and sensation are intact Psych: euthymic mood, full affect    EKG:  EKG is ordered today. EKG showed normal sinus rhythm . Inferior T wave changes suggestive of ischemia.  Recent Labs: 09/17/2016: TSH 1.59 09/24/2016: Magnesium 1.9 10/20/2016: ALT 15; Hemoglobin 13.7; Platelets 200 11/06/2016: BUN 16; Creatinine, Ser 0.91; Potassium 3.9; Sodium 141    Lipid Panel    Component Value Date/Time   CHOL 173 03/14/2015 1048   TRIG 85.0 03/14/2015 1048   HDL 57.10 03/14/2015 1048   CHOLHDL 3 03/14/2015 1048   VLDL 17.0 03/14/2015 1048   LDLCALC 99 03/14/2015 1048      Wt Readings from Last 3 Encounters:  07/03/17 236 lb (107 kg)  04/24/17 228 lb (103.4 kg)  02/10/17 225 lb 6.4 oz (102.2 kg)      PAD Screen 05/13/2016  Previous PAD dx? No  Previous surgical procedure? No  Pain with walking? No  Feet/toe relief with dangling? No  Painful, non-healing ulcers? No  Extremities discolored? No      ASSESSMENT AND PLAN:  1.  Severe mitral regurgitation due to mitral valve prolapse:  Status post successful mitral valve repair and PFO closure. She is doing well from a cardiac standpoint.  2. Postoperative atrial  fibrillation: No evidence of recurrent arrhythmia off amiodarone.  4. Essential hypertension: Blood pressure is controlled.   Disposition:   FU with me in 6 months  Signed,  Kathlyn Sacramento, MD  07/07/2017 6:18 PM    [place]

## 2017-07-03 NOTE — Patient Instructions (Signed)
Medication Instructions: Continue same medications.   Labwork: None.   Procedures/Testing: None.   Follow-Up: 6 months with Dr. Arida.   Any Additional Special Instructions Will Be Listed Below (If Applicable).     If you need a refill on your cardiac medications before your next appointment, please call your pharmacy.   

## 2017-07-06 DIAGNOSIS — J9611 Chronic respiratory failure with hypoxia: Secondary | ICD-10-CM | POA: Diagnosis not present

## 2017-07-06 DIAGNOSIS — J449 Chronic obstructive pulmonary disease, unspecified: Secondary | ICD-10-CM | POA: Diagnosis not present

## 2017-07-07 DIAGNOSIS — J9611 Chronic respiratory failure with hypoxia: Secondary | ICD-10-CM | POA: Diagnosis not present

## 2017-07-08 DIAGNOSIS — J9611 Chronic respiratory failure with hypoxia: Secondary | ICD-10-CM | POA: Diagnosis not present

## 2017-07-09 DIAGNOSIS — J9611 Chronic respiratory failure with hypoxia: Secondary | ICD-10-CM | POA: Diagnosis not present

## 2017-07-10 DIAGNOSIS — J9611 Chronic respiratory failure with hypoxia: Secondary | ICD-10-CM | POA: Diagnosis not present

## 2017-07-13 DIAGNOSIS — J9611 Chronic respiratory failure with hypoxia: Secondary | ICD-10-CM | POA: Diagnosis not present

## 2017-07-14 DIAGNOSIS — J9611 Chronic respiratory failure with hypoxia: Secondary | ICD-10-CM | POA: Diagnosis not present

## 2017-07-15 DIAGNOSIS — J9611 Chronic respiratory failure with hypoxia: Secondary | ICD-10-CM | POA: Diagnosis not present

## 2017-07-16 DIAGNOSIS — J9611 Chronic respiratory failure with hypoxia: Secondary | ICD-10-CM | POA: Diagnosis not present

## 2017-07-17 DIAGNOSIS — J9611 Chronic respiratory failure with hypoxia: Secondary | ICD-10-CM | POA: Diagnosis not present

## 2017-07-20 ENCOUNTER — Encounter: Payer: Medicare Other | Admitting: Thoracic Surgery (Cardiothoracic Vascular Surgery)

## 2017-07-20 DIAGNOSIS — J9611 Chronic respiratory failure with hypoxia: Secondary | ICD-10-CM | POA: Diagnosis not present

## 2017-07-21 DIAGNOSIS — J9611 Chronic respiratory failure with hypoxia: Secondary | ICD-10-CM | POA: Diagnosis not present

## 2017-07-22 DIAGNOSIS — J9611 Chronic respiratory failure with hypoxia: Secondary | ICD-10-CM | POA: Diagnosis not present

## 2017-07-23 DIAGNOSIS — J9611 Chronic respiratory failure with hypoxia: Secondary | ICD-10-CM | POA: Diagnosis not present

## 2017-07-24 DIAGNOSIS — J9611 Chronic respiratory failure with hypoxia: Secondary | ICD-10-CM | POA: Diagnosis not present

## 2017-07-27 ENCOUNTER — Encounter: Payer: Medicare Other | Admitting: Thoracic Surgery (Cardiothoracic Vascular Surgery)

## 2017-07-27 DIAGNOSIS — J9611 Chronic respiratory failure with hypoxia: Secondary | ICD-10-CM | POA: Diagnosis not present

## 2017-07-28 ENCOUNTER — Other Ambulatory Visit: Payer: Self-pay | Admitting: Internal Medicine

## 2017-07-28 DIAGNOSIS — I5032 Chronic diastolic (congestive) heart failure: Secondary | ICD-10-CM

## 2017-07-28 DIAGNOSIS — R42 Dizziness and giddiness: Secondary | ICD-10-CM

## 2017-07-28 DIAGNOSIS — J9611 Chronic respiratory failure with hypoxia: Secondary | ICD-10-CM | POA: Diagnosis not present

## 2017-07-29 DIAGNOSIS — J9611 Chronic respiratory failure with hypoxia: Secondary | ICD-10-CM | POA: Diagnosis not present

## 2017-07-30 ENCOUNTER — Ambulatory Visit (INDEPENDENT_AMBULATORY_CARE_PROVIDER_SITE_OTHER): Payer: Medicare HMO | Admitting: Internal Medicine

## 2017-07-30 ENCOUNTER — Encounter: Payer: Self-pay | Admitting: Internal Medicine

## 2017-07-30 VITALS — BP 126/80 | HR 82 | Temp 97.5°F | Resp 15 | Ht 62.0 in | Wt 232.0 lb

## 2017-07-30 DIAGNOSIS — E559 Vitamin D deficiency, unspecified: Secondary | ICD-10-CM | POA: Diagnosis not present

## 2017-07-30 DIAGNOSIS — Z1239 Encounter for other screening for malignant neoplasm of breast: Secondary | ICD-10-CM

## 2017-07-30 DIAGNOSIS — R7301 Impaired fasting glucose: Secondary | ICD-10-CM | POA: Diagnosis not present

## 2017-07-30 DIAGNOSIS — E669 Obesity, unspecified: Secondary | ICD-10-CM

## 2017-07-30 DIAGNOSIS — E785 Hyperlipidemia, unspecified: Secondary | ICD-10-CM | POA: Diagnosis not present

## 2017-07-30 DIAGNOSIS — Z1231 Encounter for screening mammogram for malignant neoplasm of breast: Secondary | ICD-10-CM | POA: Diagnosis not present

## 2017-07-30 DIAGNOSIS — I5032 Chronic diastolic (congestive) heart failure: Secondary | ICD-10-CM

## 2017-07-30 DIAGNOSIS — I10 Essential (primary) hypertension: Secondary | ICD-10-CM

## 2017-07-30 DIAGNOSIS — Z6841 Body Mass Index (BMI) 40.0 and over, adult: Secondary | ICD-10-CM | POA: Diagnosis not present

## 2017-07-30 DIAGNOSIS — E039 Hypothyroidism, unspecified: Secondary | ICD-10-CM | POA: Diagnosis not present

## 2017-07-30 DIAGNOSIS — J9611 Chronic respiratory failure with hypoxia: Secondary | ICD-10-CM | POA: Diagnosis not present

## 2017-07-30 DIAGNOSIS — F4381 Prolonged grief disorder: Secondary | ICD-10-CM

## 2017-07-30 DIAGNOSIS — J42 Unspecified chronic bronchitis: Secondary | ICD-10-CM | POA: Diagnosis not present

## 2017-07-30 DIAGNOSIS — F4321 Adjustment disorder with depressed mood: Secondary | ICD-10-CM

## 2017-07-30 LAB — COMPREHENSIVE METABOLIC PANEL
ALK PHOS: 68 U/L (ref 39–117)
ALT: 16 U/L (ref 0–35)
AST: 20 U/L (ref 0–37)
Albumin: 4 g/dL (ref 3.5–5.2)
BUN: 13 mg/dL (ref 6–23)
CO2: 29 mEq/L (ref 19–32)
CREATININE: 0.81 mg/dL (ref 0.40–1.20)
Calcium: 8.1 mg/dL — ABNORMAL LOW (ref 8.4–10.5)
Chloride: 105 mEq/L (ref 96–112)
GFR: 76.24 mL/min (ref >60.00)
GLUCOSE: 69 mg/dL — AB (ref 70–99)
Potassium: 3.8 mEq/L (ref 3.5–5.1)
SODIUM: 143 meq/L (ref 135–145)
TOTAL PROTEIN: 6.8 g/dL (ref 6.0–8.3)
Total Bilirubin: 0.7 mg/dL (ref 0.2–1.2)

## 2017-07-30 LAB — HEMOGLOBIN A1C: Hgb A1c MFr Bld: 5.3 % (ref 4.6–6.5)

## 2017-07-30 LAB — LDL CHOLESTEROL, DIRECT: Direct LDL: 101 mg/dL

## 2017-07-30 LAB — TSH: TSH: 0.63 u[IU]/mL (ref 0.35–4.50)

## 2017-07-30 LAB — VITAMIN D 25 HYDROXY (VIT D DEFICIENCY, FRACTURES): VITD: 16.84 ng/mL — ABNORMAL LOW (ref 30.00–100.00)

## 2017-07-30 NOTE — Progress Notes (Signed)
Subjective:  Patient ID: Lauren Bartlett, female    DOB: 08-Oct-1955  Age: 62 y.o. MRN: [id number]  CC: The primary encounter diagnosis was Impaired fasting glucose. Diagnoses of Hypothyroidism, unspecified type, Vitamin D deficiency, Hyperlipidemia LDL goal <100, Breast cancer screening, Grief reaction with prolonged bereavement, Essential hypertension, Chronic bronchitis, unspecified chronic bronchitis type ([place]), Chronic diastolic congestive heart failure ([place]), and Obesity (BMI 30-39.9) were also pertinent to this visit.  HPI MARILI VADER presents for follow up on multiple medical issues   Breathing much better since her mitral valve repair,  April 2018.  ECHO June 2018 normal LV , mod pulm htn and mod TR.   Father died in 31-Jan-2023.  They were very close.   attributes weight gain to mood Bailey Mech. Still grieving .  Tearful today  Has 4 brothers locally who are supportive.  Does not want grief counselling , belongs to [employer].  Mother passed away 25 years ago,  [birth year]22 is his birthday.  Has plans to be with friend   Weight gain addressed.  She has gained 16 lb since last visit in June BMI now 71 personal best 191 I April 2017 (BMI 35)    NEEDS REFILL ON MEDS    Outpatient Medications Prior to Visit  Medication Sig Dispense Refill  . acetaminophen (TYLENOL) 500 MG tablet Take 500 mg by mouth 2 (two) times daily as needed for moderate pain or headache.    . albuterol (PROVENTIL HFA;VENTOLIN HFA) 108 (90 Base) MCG/ACT inhaler Inhale 2 puffs into the lungs every 6 (six) hours as needed for wheezing or shortness of breath. Pt uses with SPACER. 1 Inhaler 2  . albuterol (PROVENTIL) (2.5 MG/3ML) 0.083% nebulizer solution Take 2.5 mg by nebulization every 6 (six) hours as needed for wheezing or shortness of breath.    [name] aspirin EC 81 MG EC tablet Take 1 tablet (81 mg total) by mouth daily.    . ergocalciferol (DRISDOL) 50000 units capsule Take 1 capsule (50,000 Units total) by  mouth once a week. 4 capsule 11  . famotidine (PEPCID AC) 10 MG chewable tablet Chew 10 mg by mouth daily as needed for heartburn.    . ferrous sulfate 325 (65 FE) MG tablet Take 325 mg by mouth daily with breakfast.    . fluticasone (FLONASE) 50 MCG/ACT nasal spray USE 2 SPRAYS IN EACH NOSTRIL EVERY DAY 48 g 2  . furosemide (LASIX) 20 MG tablet TAKE 1 TABLET EVERY DAY 90 tablet 1  . Hypromellose (ISOPTO TEARS) 0.5 % SOLN Apply 1 drop to eye 2 (two) times daily.    [name] levothyroxine (SYNTHROID, LEVOTHROID) 125 MCG tablet TAKE 1 TABLET EVERY DAY BEFORE BREAKFAST 90 tablet 1  . loratadine-pseudoephedrine (CLARITIN-D 24-HOUR) 10-240 MG 24 hr tablet Take 1 tablet by mouth daily.    [name] LORazepam (ATIVAN) 0.5 MG tablet Take 1 tablet (0.5 mg total) by mouth every 8 (eight) hours as needed for anxiety. 30 tablet 0  . losartan (COZAAR) 50 MG tablet TAKE 1 TABLET EVERY DAY 90 tablet 1  . OXYGEN Inhale 2 L into the lungs at bedtime as needed (at bedtime and as needed).    [name] oxymetazoline (AFRIN) 0.05 % nasal spray Place 1 spray into both nostrils 2 (two) times daily. As needed for nose bleed    . polyethylene glycol powder (GLYCOLAX/MIRALAX) powder Take 17 g by mouth 2 (two) times daily as needed. (Patient taking differently: Take 17 g by mouth daily as  needed for mild constipation. ) 3350 g 1  . traMADol (ULTRAM) 50 MG tablet Take 1-2 tablets (50-100 mg total) by mouth every 4 (four) hours as needed for moderate pain. 30 tablet 0  . umeclidinium-vilanterol (ANORO ELLIPTA) 62.5-25 MCG/INH AEPB Inhale 1 puff into the lungs daily. 1 each 11  . vitamin B-12 (CYANOCOBALAMIN) 1000 MCG tablet Take 1 tablet (1,000 mcg total) by mouth daily. 90 tablet 3  . ipratropium (ATROVENT) 0.03 % nasal spray USE 2 SPRAYS NASALLY THREE TIMES DAILY (Patient not taking: Reported on 07/30/2017) 90 mL 0   No facility-administered medications prior to visit.     Review of Systems;  Patient denies headache, fevers, malaise,  unintentional weight loss, skin rash, eye pain, sinus congestion and sinus pain, sore throat, dysphagia,  hemoptysis , cough, dyspnea, wheezing, chest pain, palpitations, orthopnea, edema, abdominal pain, nausea, melena, diarrhea, constipation, flank pain, dysuria, hematuria, urinary  Frequency, nocturia, numbness, tingling, seizures,  Focal weakness, Loss of consciousness,  Tremor, insomnia, depression, anxiety, and suicidal ideation.      Objective:  BP 126/80 (BP Location: Left Arm, Patient Position: Sitting, Cuff Size: Large)   Pulse 82   Temp (!) 97.5 F (36.4 C) (Oral)   Resp 15   Ht 5\' 2"  (1.575 m)   Wt 232 lb (105.2 kg)   SpO2 98%   BMI 42.43 kg/m   BP Readings from Last 3 Encounters:  07/30/17 126/80  07/03/17 110/68  04/24/17 94/68    Wt Readings from Last 3 Encounters:  07/30/17 232 lb (105.2 kg)  07/03/17 236 lb (107 kg)  04/24/17 228 lb (103.4 kg)    General appearance: alert, cooperative and appears stated age Ears: normal TM's and external ear canals both ears Throat: lips, mucosa, and tongue normal; teeth and gums normal Neck: no adenopathy, no carotid bruit, supple, symmetrical, trachea midline and thyroid not enlarged, symmetric, no tenderness/mass/nodules Back: symmetric, no curvature. ROM normal. No CVA tenderness. Lungs: clear to auscultation bilaterally Heart: regular rate and rhythm, S1, S2 normal, no murmur, click, rub or gallop Abdomen: soft, non-tender; bowel sounds normal; no masses,  no organomegaly Pulses: 2+ and symmetric Skin: Skin color, texture, turgor normal. No rashes or lesions Lymph nodes: Cervical, supraclavicular, and axillary nodes normal.  Lab Results  Component Value Date   HGBA1C 5.3 07/30/2017   HGBA1C 5.1 07/14/2016   HGBA1C 5.4 07/28/2015    Lab Results  Component Value Date   CREATININE 0.81 07/30/2017   CREATININE 0.91 11/06/2016   CREATININE 0.92 10/20/2016    Lab Results  Component Value Date   WBC 9.5  10/20/2016   HGB 13.7 10/20/2016   HCT 42.3 10/20/2016   PLT 200 10/20/2016   GLUCOSE 69 (L) 07/30/2017   CHOL 173 03/14/2015   TRIG 85.0 03/14/2015   HDL 57.10 03/14/2015   LDLDIRECT 101.0 07/30/2017   LDLCALC 99 03/14/2015   ALT 16 07/30/2017   AST 20 07/30/2017   NA 143 07/30/2017   K 3.8 07/30/2017   CL 105 07/30/2017   CREATININE 0.81 07/30/2017   BUN 13 07/30/2017   CO2 29 07/30/2017   TSH 0.63 07/30/2017   INR 1.4 08/26/2016   HGBA1C 5.3 07/30/2017    No results found.  Assessment & Plan:   Problem List Items Addressed This Visit    Vitamin D deficiency (Chronic)   Relevant Orders   VITAMIN D 25 Hydroxy (Vit-D Deficiency, Fractures) (Completed)   Obesity (BMI 30-39.9) (Chronic)    I have addressed  BMI and recommended a low glycemic index diet utilizing smaller more frequent meals to increase metabolism.  I have also recommeded that patient resume a regimen in exercising with a goal of 30 minutes of aerobic exercise a minimum of 5 days per week.       Hypothyroidism (Chronic)    Thyroid function is maximally active on current dose.  No current changes needed.   Lab Results  Component Value Date   TSH 0.63 07/30/2017         Relevant Orders   TSH (Completed)   Hypertension (Chronic)    Well controlled on current regimen. Renal function stable, no changes today.  Lab Results  Component Value Date   CREATININE 0.81 07/30/2017   Lab Results  Component Value Date   NA 143 07/30/2017   K 3.8 07/30/2017   CL 105 07/30/2017   CO2 29 07/30/2017         Grief reaction with prolonged bereavement    Due to loss of father in October. Patient has adequate coping skills and emotional support .  i have asked patinet to return in one month to examine for signs of unresolving grief.       COPD (chronic obstructive pulmonary disease) (HCC) (Chronic)    By PFT's wth no history of tobacco abuse  Improved symptoms since her mitral valve repair.       Chronic  diastolic congestive heart failure (HCC) (Chronic)    Continue use of lasix snd losartan daily  Lab Results  Component Value Date   CREATININE 0.81 07/30/2017          Other Visit Diagnoses    Impaired fasting glucose    -  Primary   Relevant Orders   Comprehensive metabolic panel (Completed)   Hemoglobin A1c (Completed)   Hyperlipidemia LDL goal <100       Relevant Orders   LDL cholesterol, direct (Completed)   Breast cancer screening       Relevant Orders   MM DIGITAL SCREENING BILATERAL      I have discontinued Jocelyn Lamer B. Burak's ipratropium. I am also having her maintain her loratadine-pseudoephedrine, polyethylene glycol powder, OXYGEN, albuterol, famotidine, acetaminophen, Hypromellose, aspirin, ferrous sulfate, vitamin B-12, LORazepam, traMADol, oxymetazoline, ergocalciferol, albuterol, umeclidinium-vilanterol, losartan, fluticasone, furosemide, and levothyroxine.  No orders of the defined types were placed in this encounter.   Medications Discontinued During This Encounter  Medication Reason  . ipratropium (ATROVENT) 0.03 % nasal spray Discontinued by provider    Follow-up: Return in about 6 months (around 01/30/2018).   [name], MD

## 2017-07-30 NOTE — Patient Instructions (Addendum)
I AM SO SORRY ABOUT THE LOSS OF YOUR FATHER. PLEASE LET ME KNOW IF YOU CHANGE YOUR MIND ABOUT GRIEF COUNSELLING   I'm glad you are trying to lose weight,  Your goal is 192 lbs since you reached it in 2017   Try the Health choice entrees for dinner and lunch .  The "Beef Merlot" is a good choice and is low in sugar.  Avoid the ones with breaded meats and macaroni   Low carb ice cream and low carb yogurt  Has become more popular and make great choices for dessert.  Try the Dannon  Lt n Fit greek yogurt and top with whipped cream   :  Lauren Bartlett  frozen yogurt   Skinny Cow ice cream bars   Weight watchers ice cream  bars

## 2017-07-31 DIAGNOSIS — J9611 Chronic respiratory failure with hypoxia: Secondary | ICD-10-CM | POA: Diagnosis not present

## 2017-08-01 DIAGNOSIS — F4329 Adjustment disorder with other symptoms: Secondary | ICD-10-CM | POA: Insufficient documentation

## 2017-08-01 DIAGNOSIS — F4321 Adjustment disorder with depressed mood: Secondary | ICD-10-CM | POA: Insufficient documentation

## 2017-08-01 DIAGNOSIS — F4381 Prolonged grief disorder: Secondary | ICD-10-CM | POA: Insufficient documentation

## 2017-08-01 NOTE — Assessment & Plan Note (Signed)
I have addressed  BMI and recommended a low glycemic index diet utilizing smaller more frequent meals to increase metabolism.  I have also recommeded that patient resume a regimen in exercising with a goal of 30 minutes of aerobic exercise a minimum of 5 days per week.

## 2017-08-01 NOTE — Assessment & Plan Note (Signed)
Due to loss of father in October. Patient has adequate coping skills and emotional support .  i have asked patinet to return in one month to examine for signs of unresolving grief.

## 2017-08-01 NOTE — Assessment & Plan Note (Addendum)
Continue use of lasix snd losartan daily  Lab Results  Component Value Date   CREATININE 0.81 07/30/2017

## 2017-08-01 NOTE — Assessment & Plan Note (Signed)
By PFT's wth no history of tobacco abuse  Improved symptoms since her mitral valve repair.

## 2017-08-01 NOTE — Assessment & Plan Note (Addendum)
Thyroid function is maximally active on current dose.  No current changes needed.   Lab Results  Component Value Date   TSH 0.63 07/30/2017

## 2017-08-01 NOTE — Assessment & Plan Note (Signed)
Well controlled on current regimen. Renal function stable, no changes today.  Lab Results  Component Value Date   CREATININE 0.81 07/30/2017   Lab Results  Component Value Date   NA 143 07/30/2017   K 3.8 07/30/2017   CL 105 07/30/2017   CO2 29 07/30/2017

## 2017-08-02 ENCOUNTER — Other Ambulatory Visit: Payer: Self-pay | Admitting: Internal Medicine

## 2017-08-02 MED ORDER — ERGOCALCIFEROL 1.25 MG (50000 UT) PO CAPS: 50000.0000 [IU] | ORAL_CAPSULE | ORAL | 11 refills | Status: DC

## 2017-08-03 DIAGNOSIS — J9611 Chronic respiratory failure with hypoxia: Secondary | ICD-10-CM | POA: Diagnosis not present

## 2017-08-04 DIAGNOSIS — J9611 Chronic respiratory failure with hypoxia: Secondary | ICD-10-CM | POA: Diagnosis not present

## 2017-08-05 DIAGNOSIS — J9611 Chronic respiratory failure with hypoxia: Secondary | ICD-10-CM | POA: Diagnosis not present

## 2017-08-05 DIAGNOSIS — J449 Chronic obstructive pulmonary disease, unspecified: Secondary | ICD-10-CM | POA: Diagnosis not present

## 2017-08-06 DIAGNOSIS — J9611 Chronic respiratory failure with hypoxia: Secondary | ICD-10-CM | POA: Diagnosis not present

## 2017-08-07 DIAGNOSIS — J9611 Chronic respiratory failure with hypoxia: Secondary | ICD-10-CM | POA: Diagnosis not present

## 2017-08-10 DIAGNOSIS — J9611 Chronic respiratory failure with hypoxia: Secondary | ICD-10-CM | POA: Diagnosis not present

## 2017-08-11 DIAGNOSIS — J9611 Chronic respiratory failure with hypoxia: Secondary | ICD-10-CM | POA: Diagnosis not present

## 2017-08-12 DIAGNOSIS — J9611 Chronic respiratory failure with hypoxia: Secondary | ICD-10-CM | POA: Diagnosis not present

## 2017-08-13 DIAGNOSIS — J9611 Chronic respiratory failure with hypoxia: Secondary | ICD-10-CM | POA: Diagnosis not present

## 2017-08-14 DIAGNOSIS — J9611 Chronic respiratory failure with hypoxia: Secondary | ICD-10-CM | POA: Diagnosis not present

## 2017-08-15 IMAGING — CT CT ANGIO CHEST
2 of 7 series · 17 of 36 positions shown · IV contrast (APPLIED)
Comparison: None.

CLINICAL DATA: Mitral valve insufficiency and preoperative
evaluation. Evaluate for atherosclerotic disease and occlusive
disease.

EXAM:
CT ANGIOGRAPHY CHEST, ABDOMEN AND PELVIS
TECHNIQUE: Multidetector CT imaging through the chest, abdomen and pelvis was
performed using the standard protocol during bolus administration of
intravenous contrast. Multiplanar reconstructed images and MIPs were
obtained and reviewed to evaluate the vascular anatomy.
CONTRAST:  100 mL Isovue 370

[Series 6: axial arterial · axial · arterial · 0.83mm/px · z∈[-585,-30]mm · 16 of 211 slices shown]
[im 13/211  lung]
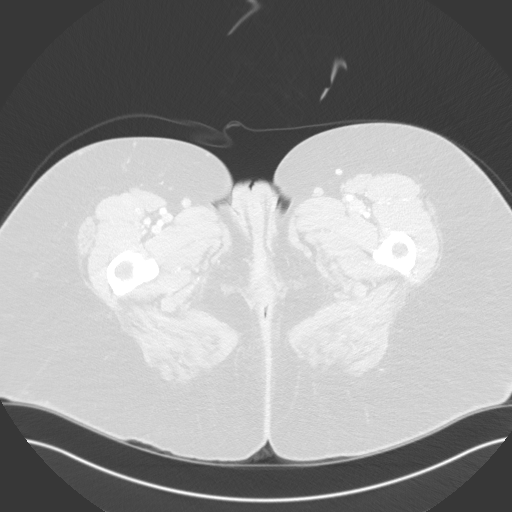
[im 25/211  mediastinal]
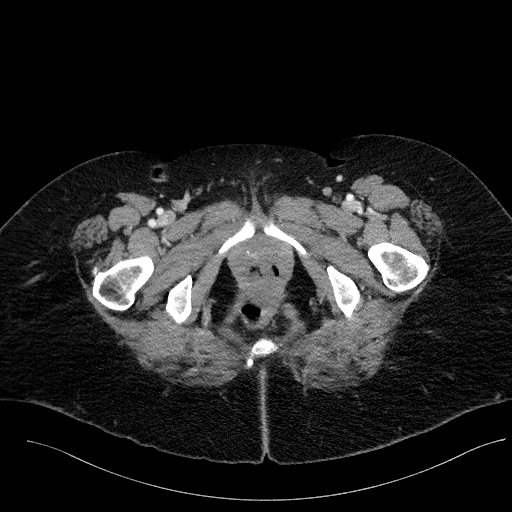
[im 38/211  lung]
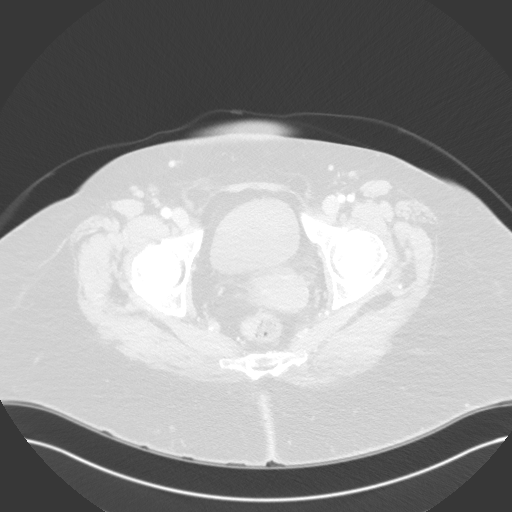
[im 50/211  mediastinal]
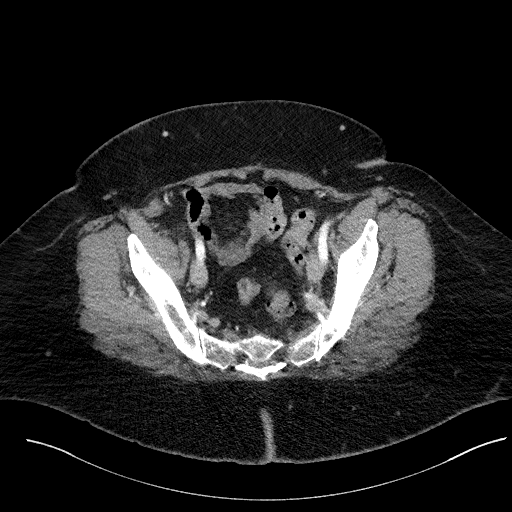
[im 62/211  lung]
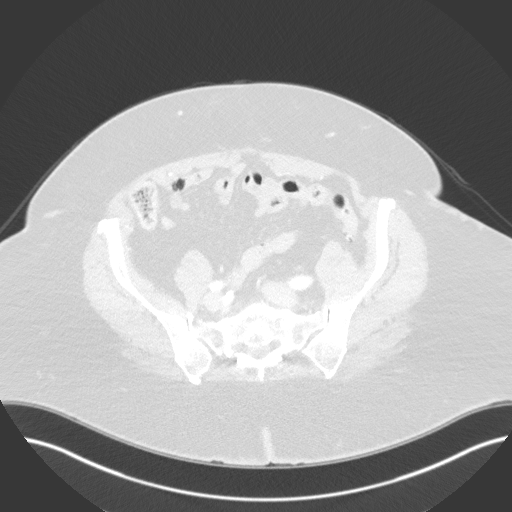
[im 75/211  mediastinal]
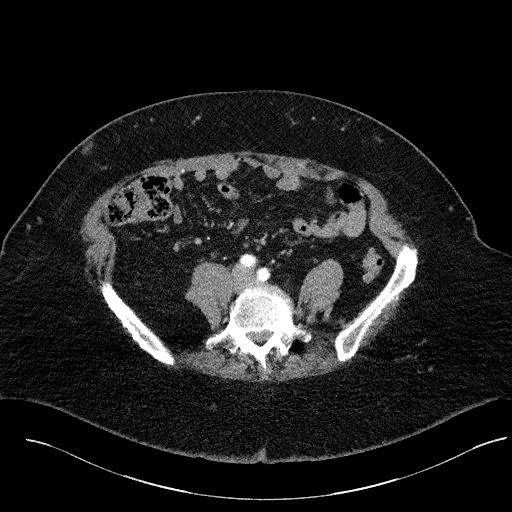
[im 87/211  lung]
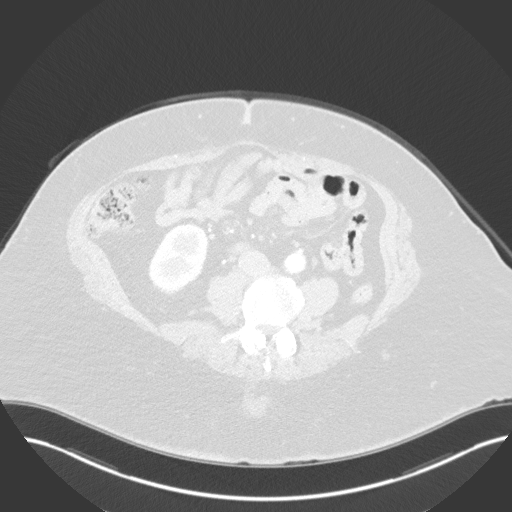
[im 99/211  mediastinal]
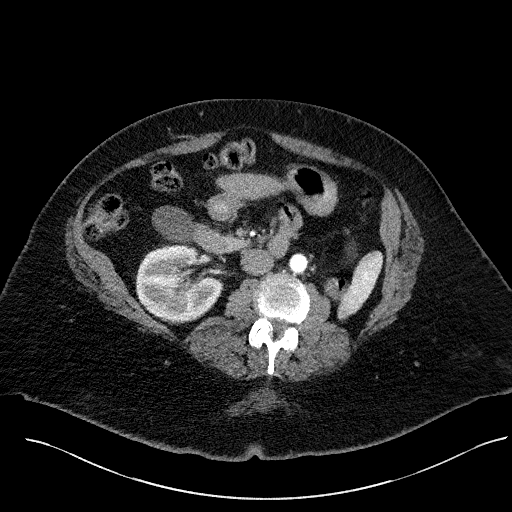
[im 112/211  lung]
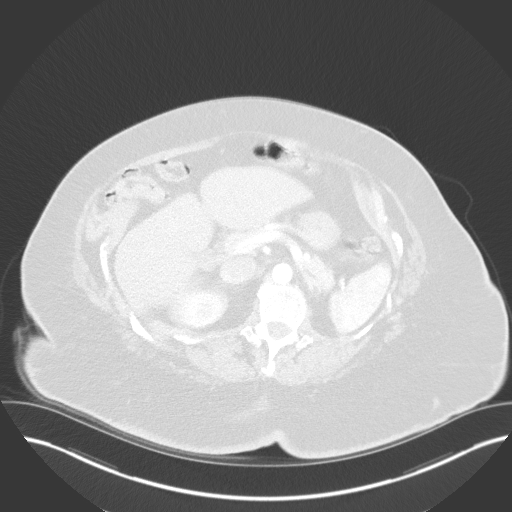
[im 124/211  mediastinal]
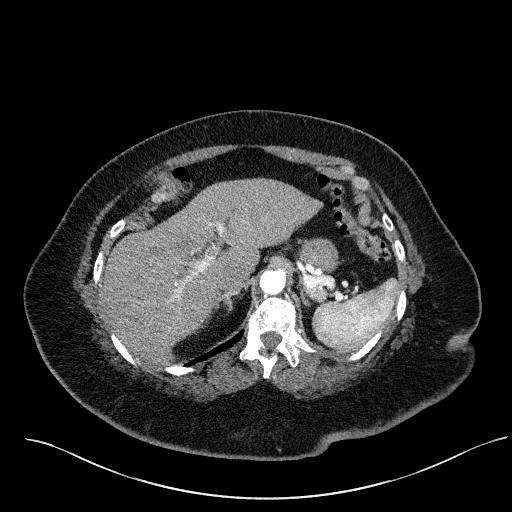
[im 136/211  lung]
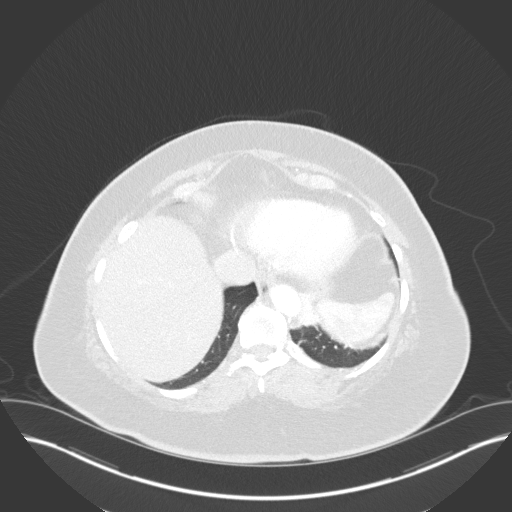
[im 149/211  mediastinal]
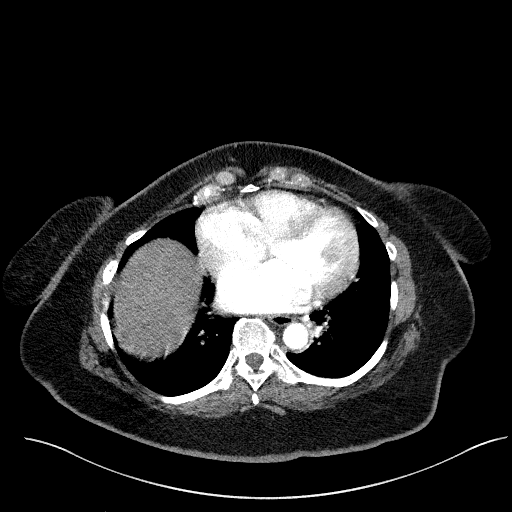
[im 161/211  lung]
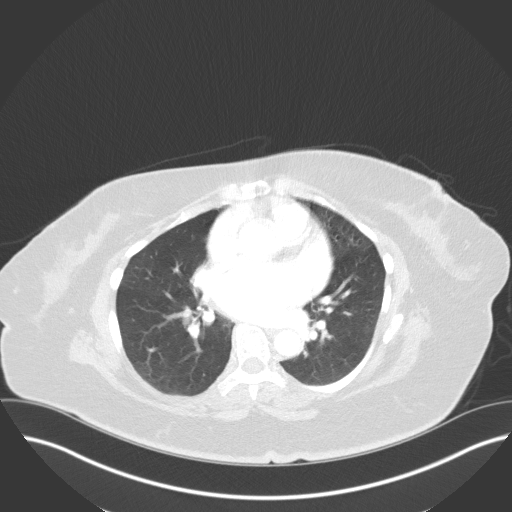
[im 173/211  mediastinal]
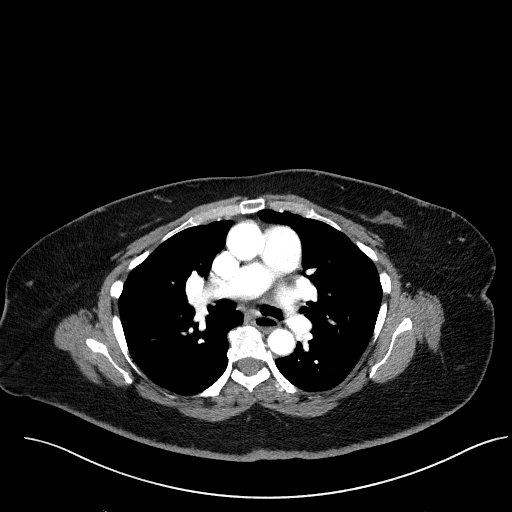
[im 186/211  lung]
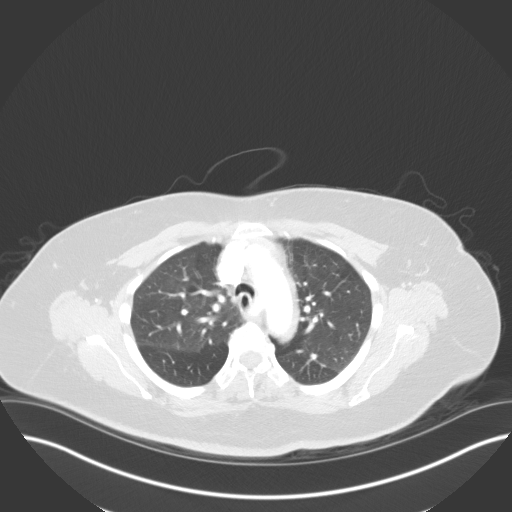
[im 198/211  mediastinal]
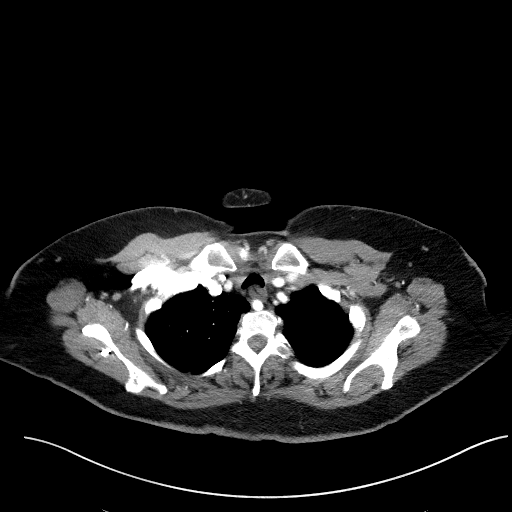

[Series 8: coronals · coronal · 0.99mm/px · 1 of 149 slices shown]
[im 75/149  mediastinal]
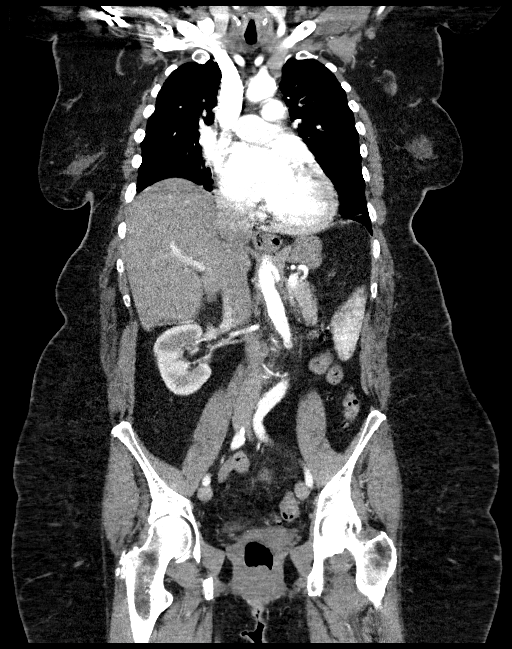

[17 of 36 positions shown; findings below may reference images not displayed]

FINDINGS: CTA CHEST FINDINGS

Cardiovascular: Aberrant right subclavian artery without aneurysm at
the origin. The right subclavian artery is posterior to the
esophagus and distal to the left subclavian artery. Great vessels
are patent. Normal caliber of the thoracic aorta without dissection.
Minimal atherosclerotic disease of the aortic arch. Overall, no
significant atherosclerotic disease in the chest. Main pulmonary
arteries appear to be patent. Enlargement of the main pulmonary
artery measuring up to 3.5 cm. Enlargement of the left atrium
compatible with mitral valve disease.

Mediastinum/Nodes: No lymph node enlargement in the mediastinum,
hila or axillary regions.

Lungs/Pleura: No pleural effusions. Lungs are clear without airspace
disease or consolidation.

Musculoskeletal: Dextroscoliosis of the thoracic spine.

Review of the MIP images confirms the above findings.

CTA ABDOMEN AND PELVIS FINDINGS

VASCULAR

Aorta: The abdominal aorta is mildly tortuous related to the
scoliosis. Normal caliber of the abdominal aorta without aneurysm or
dissection. Minimal calcified plaque in the distal abdominal aorta.

Celiac: Limited evaluation due to motion artifact. Celiac trunk is
widely patent. Main branches appear to be patent.

SMA: SMA is patent without significant plaque or stenosis.

Renals: Solitary right kidney. Right renal artery is patent without
significant plaque or stenosis.

IMA: IMA is patent.

Inflow: Iliac arteries are mildly tortuous without stenosis. Small
amount of calcified plaque in the right common iliac artery and
right internal iliac artery. Proximal femoral arteries are patent.

Veins: No obvious venous abnormality within the limitations of this
arterial phase study.

Review of the MIP images confirms the above findings.

NON-VASCULAR

Hepatobiliary: Normal appearance of the liver and gallbladder.

Pancreas: Normal appearance of the pancreas without inflammation or
duct dilatation.

Spleen: Normal appearance of spleen without enlargement.

Adrenals/Urinary Tract: Bilateral adrenal glands appear normal.
Solitary right kidney. Large amount of motion artifact limits
evaluation of the right kidney but no gross abnormality to the right
kidney. Negative for hydronephrosis. Absent left kidney appears to
be congenital. Normal appearance of the urinary bladder.

Stomach/Bowel: Small hiatal hernia. Scattered colonic diverticula
without acute inflammatory changes. No evidence for bowel
obstruction or bowel dilatation.

Lymphatic: No significant lymph node enlargement in the abdomen or
pelvis.

Reproductive: Status post hysterectomy. No adnexal masses.

Other: No free fluid. Right inguinal hernia containing fat. Small
ventral hernia containing fat near the umbilicus.

Musculoskeletal: Levoscoliosis in the lumbar spine.

Review of the MIP images confirms the above findings.
IMPRESSION: Minimal atherosclerotic disease in the chest, abdomen and pelvis. No
significant arterial stenosis.

Tortuosity of the aorta and iliac arteries related to scoliosis.

Aberrant right subclavian artery.

Solitary right kidney. There appears to be congenital absence of the
left kidney.

## 2017-08-17 DIAGNOSIS — J9611 Chronic respiratory failure with hypoxia: Secondary | ICD-10-CM | POA: Diagnosis not present

## 2017-08-18 DIAGNOSIS — J9611 Chronic respiratory failure with hypoxia: Secondary | ICD-10-CM | POA: Diagnosis not present

## 2017-08-19 DIAGNOSIS — J9611 Chronic respiratory failure with hypoxia: Secondary | ICD-10-CM | POA: Diagnosis not present

## 2017-08-20 DIAGNOSIS — J9611 Chronic respiratory failure with hypoxia: Secondary | ICD-10-CM | POA: Diagnosis not present

## 2017-08-21 DIAGNOSIS — J9611 Chronic respiratory failure with hypoxia: Secondary | ICD-10-CM | POA: Diagnosis not present

## 2017-08-24 DIAGNOSIS — J9611 Chronic respiratory failure with hypoxia: Secondary | ICD-10-CM | POA: Diagnosis not present

## 2017-08-25 DIAGNOSIS — J9611 Chronic respiratory failure with hypoxia: Secondary | ICD-10-CM | POA: Diagnosis not present

## 2017-08-25 IMAGING — CR DG CHEST 1V PORT
1 series · 1 of 1 positions shown · non-contrast
Comparison: 07/17/2016

CLINICAL DATA: Postop atelectasis.

EXAM:
PORTABLE CHEST 1 VIEW

[AP]
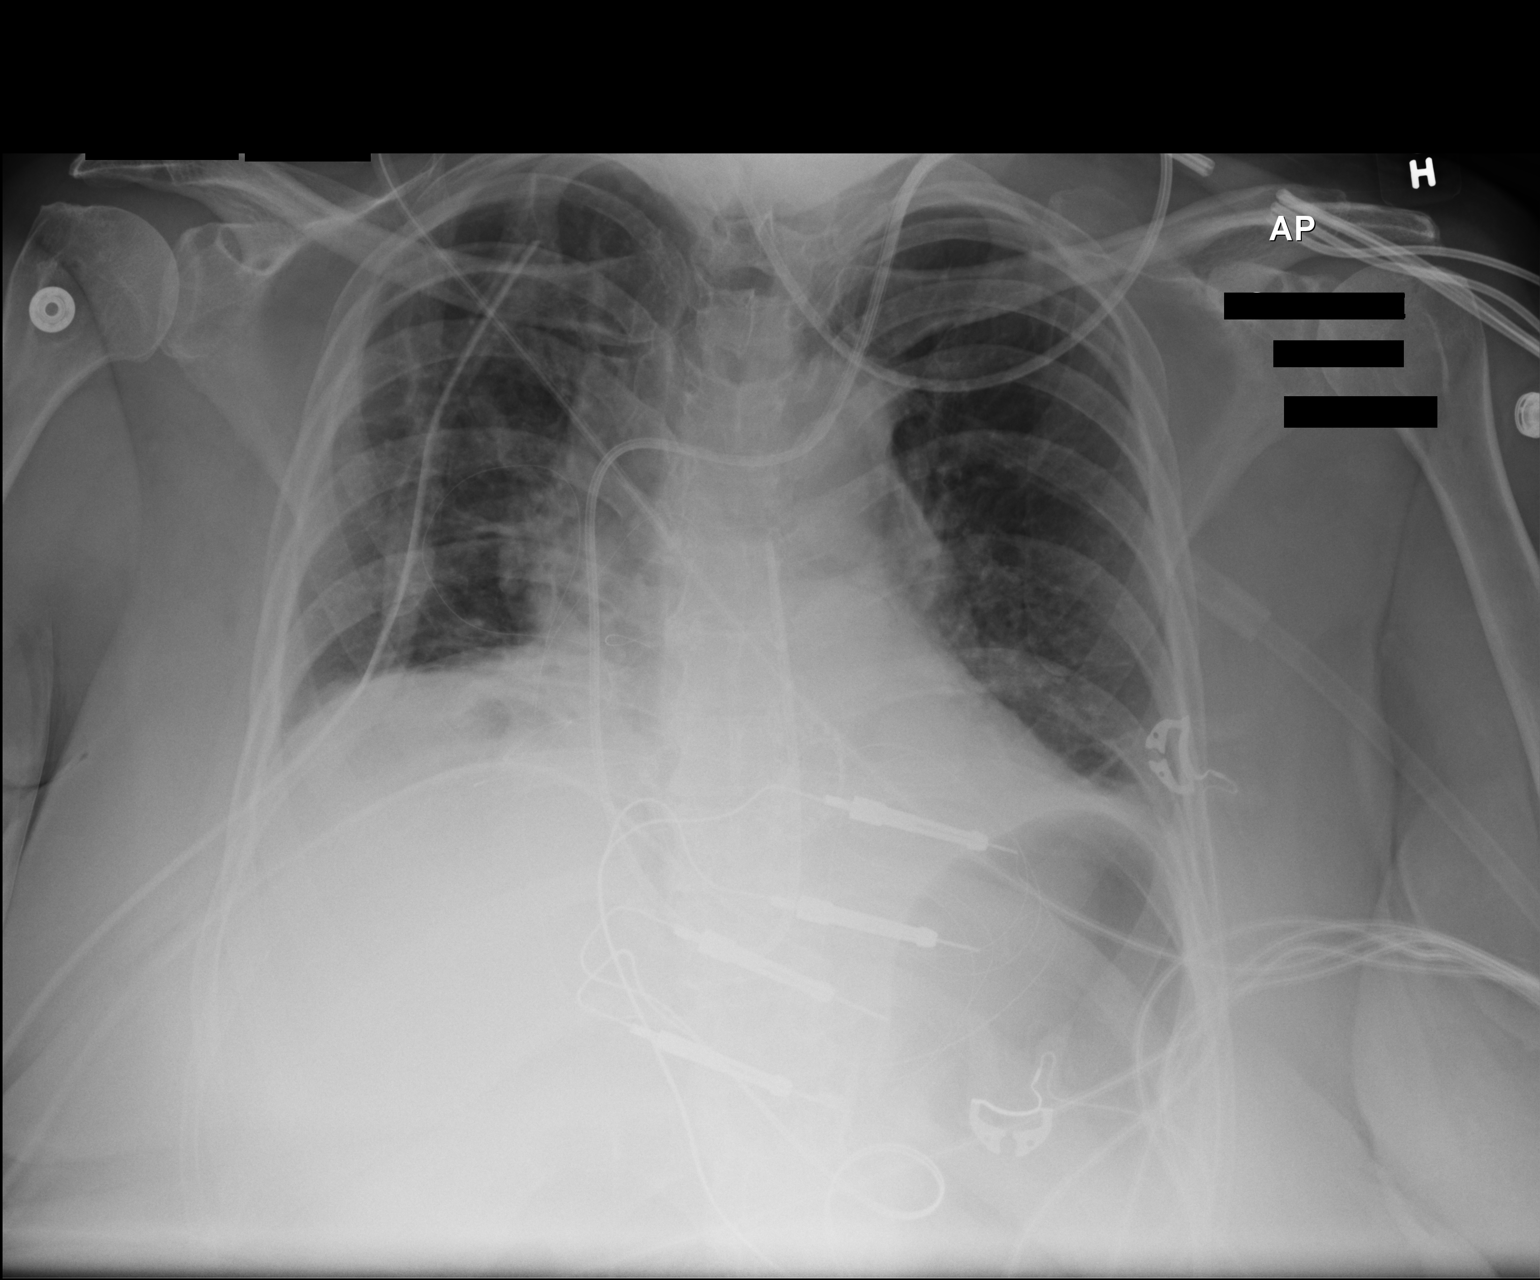

[1 of 1 positions shown; findings below may reference images not displayed]

FINDINGS: Endotracheal tube and nasogastric tube have been removed. Right
chest tube remains in place. No pneumothorax visualized. Swan-Ganz
catheter remains in stable position. Decreased lung volumes are seen
with increased bibasilar atelectasis. Heart size remains stable.
IMPRESSION: Increased bibasilar atelectasis following extubation. No
pneumothorax visualized.

## 2017-08-26 DIAGNOSIS — J9611 Chronic respiratory failure with hypoxia: Secondary | ICD-10-CM | POA: Diagnosis not present

## 2017-08-27 DIAGNOSIS — J9611 Chronic respiratory failure with hypoxia: Secondary | ICD-10-CM | POA: Diagnosis not present

## 2017-08-27 IMAGING — CR DG CHEST 1V PORT
1 series · 1 of 1 positions shown · non-contrast
Comparison: 07/19/2016

CLINICAL DATA: Status post MVR

EXAM:
PORTABLE CHEST 1 VIEW

[AP]
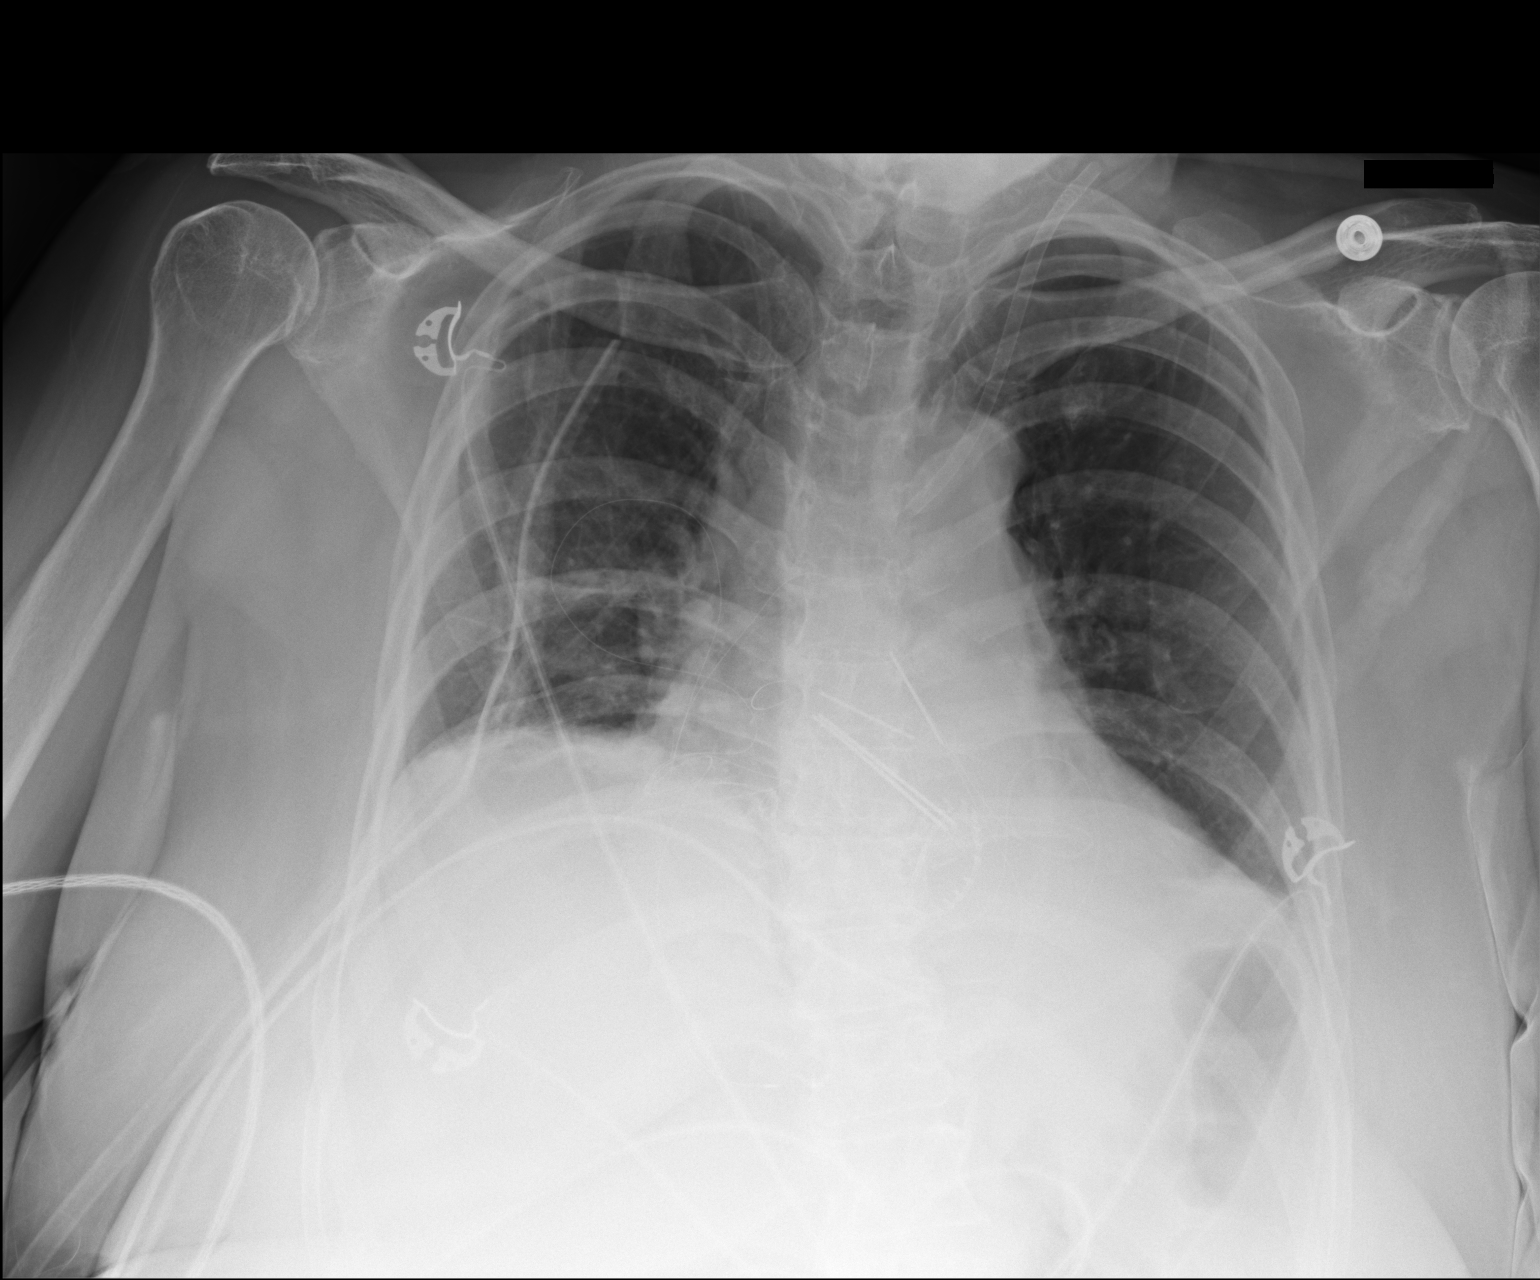

[1 of 1 positions shown; findings below may reference images not displayed]

FINDINGS: Right-sided chest tube remains in place. There is no pneumothorax
identified.

Probable tiny left effusion. Mild bibasilar atelectasis. Stable
cardiomediastinal silhouette. Left-sided catheter tip overlies the
brachiocephalic region.
IMPRESSION: Low lung volumes with persistent bibasilar atelectasis and probable
tiny left effusion.

## 2017-08-28 DIAGNOSIS — J9611 Chronic respiratory failure with hypoxia: Secondary | ICD-10-CM | POA: Diagnosis not present

## 2017-08-28 IMAGING — CR DG CHEST 2V
2 series · 2 of 2 positions shown · non-contrast
Comparison: July 20, 2016

CLINICAL DATA: Status post mitral valve repair

EXAM:
CHEST  2 VIEW

[chest pa]
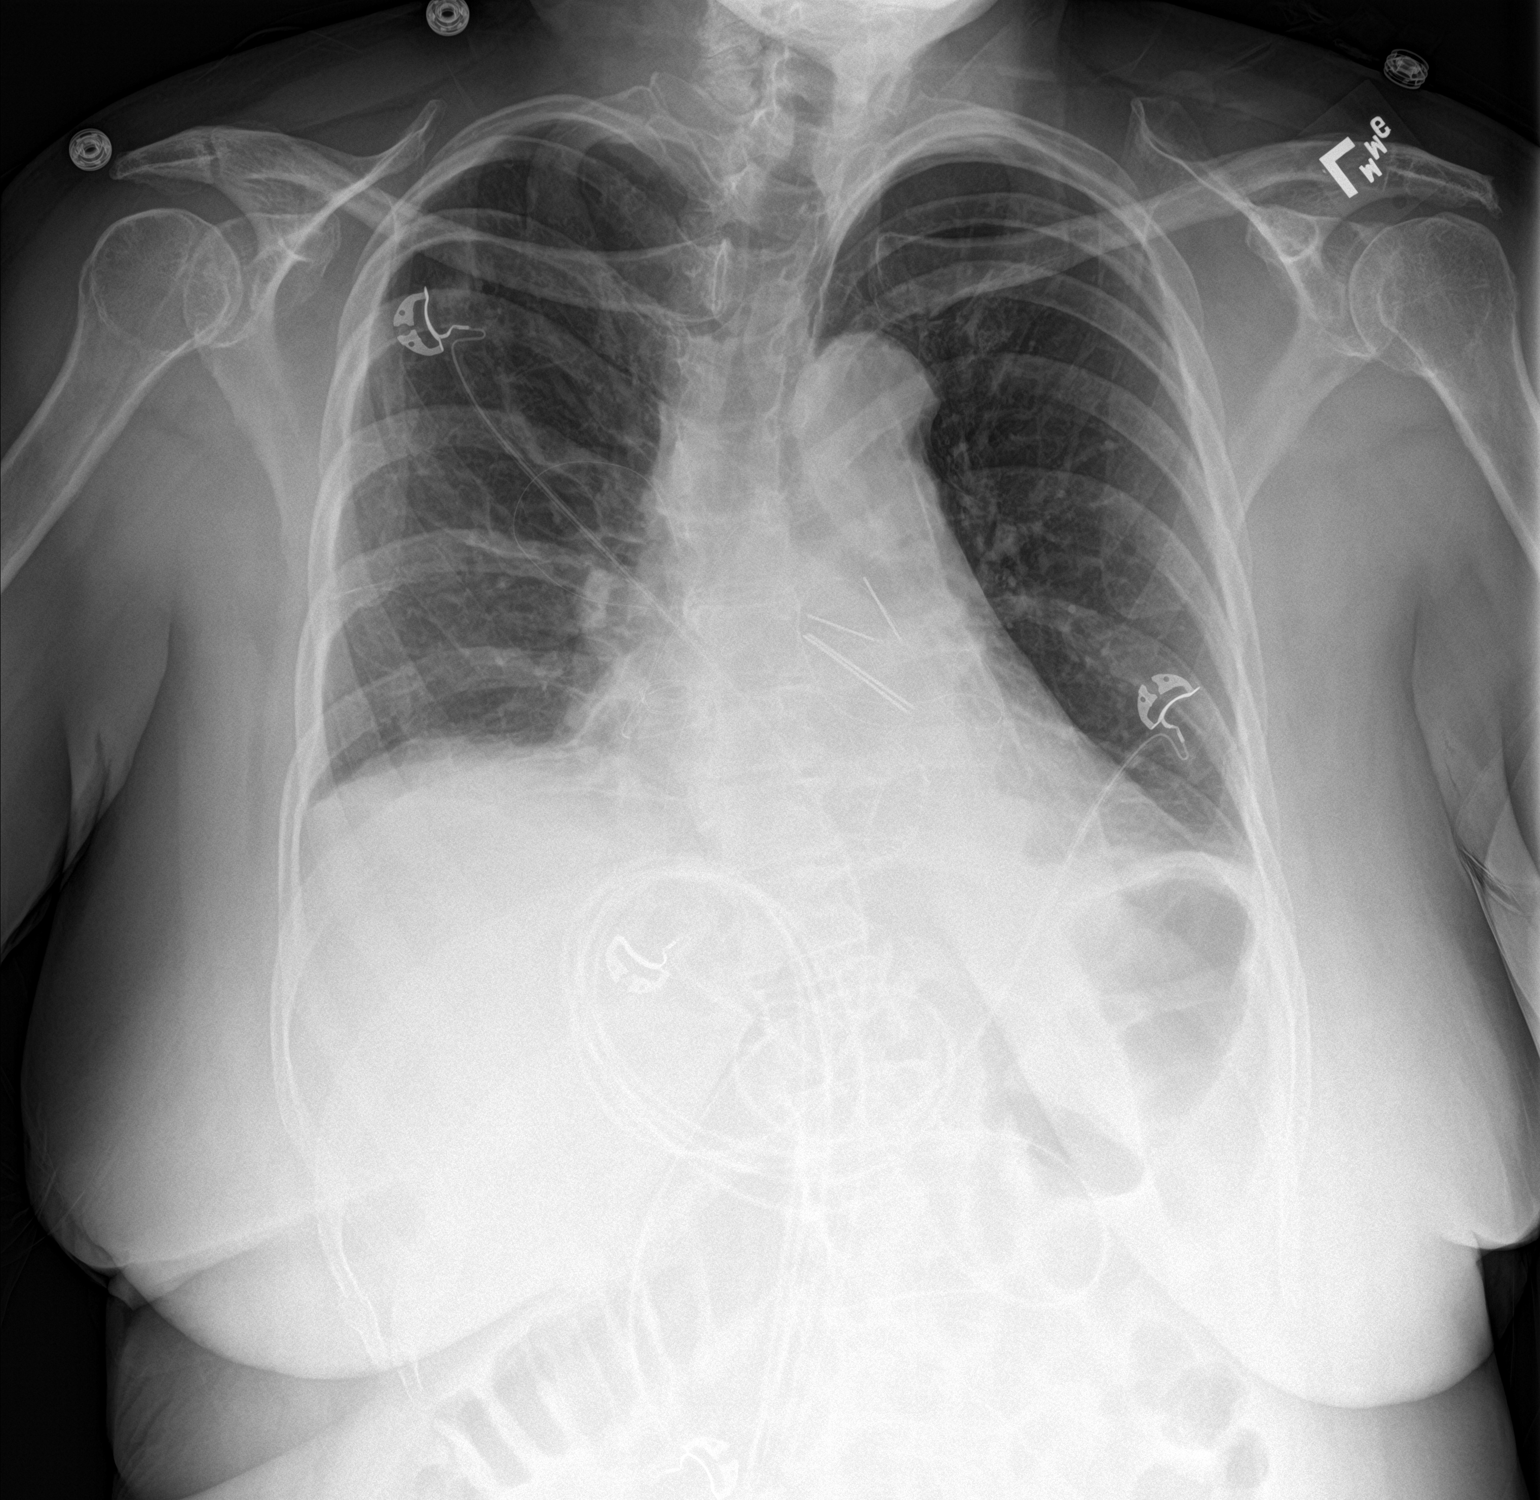

[chest lat]
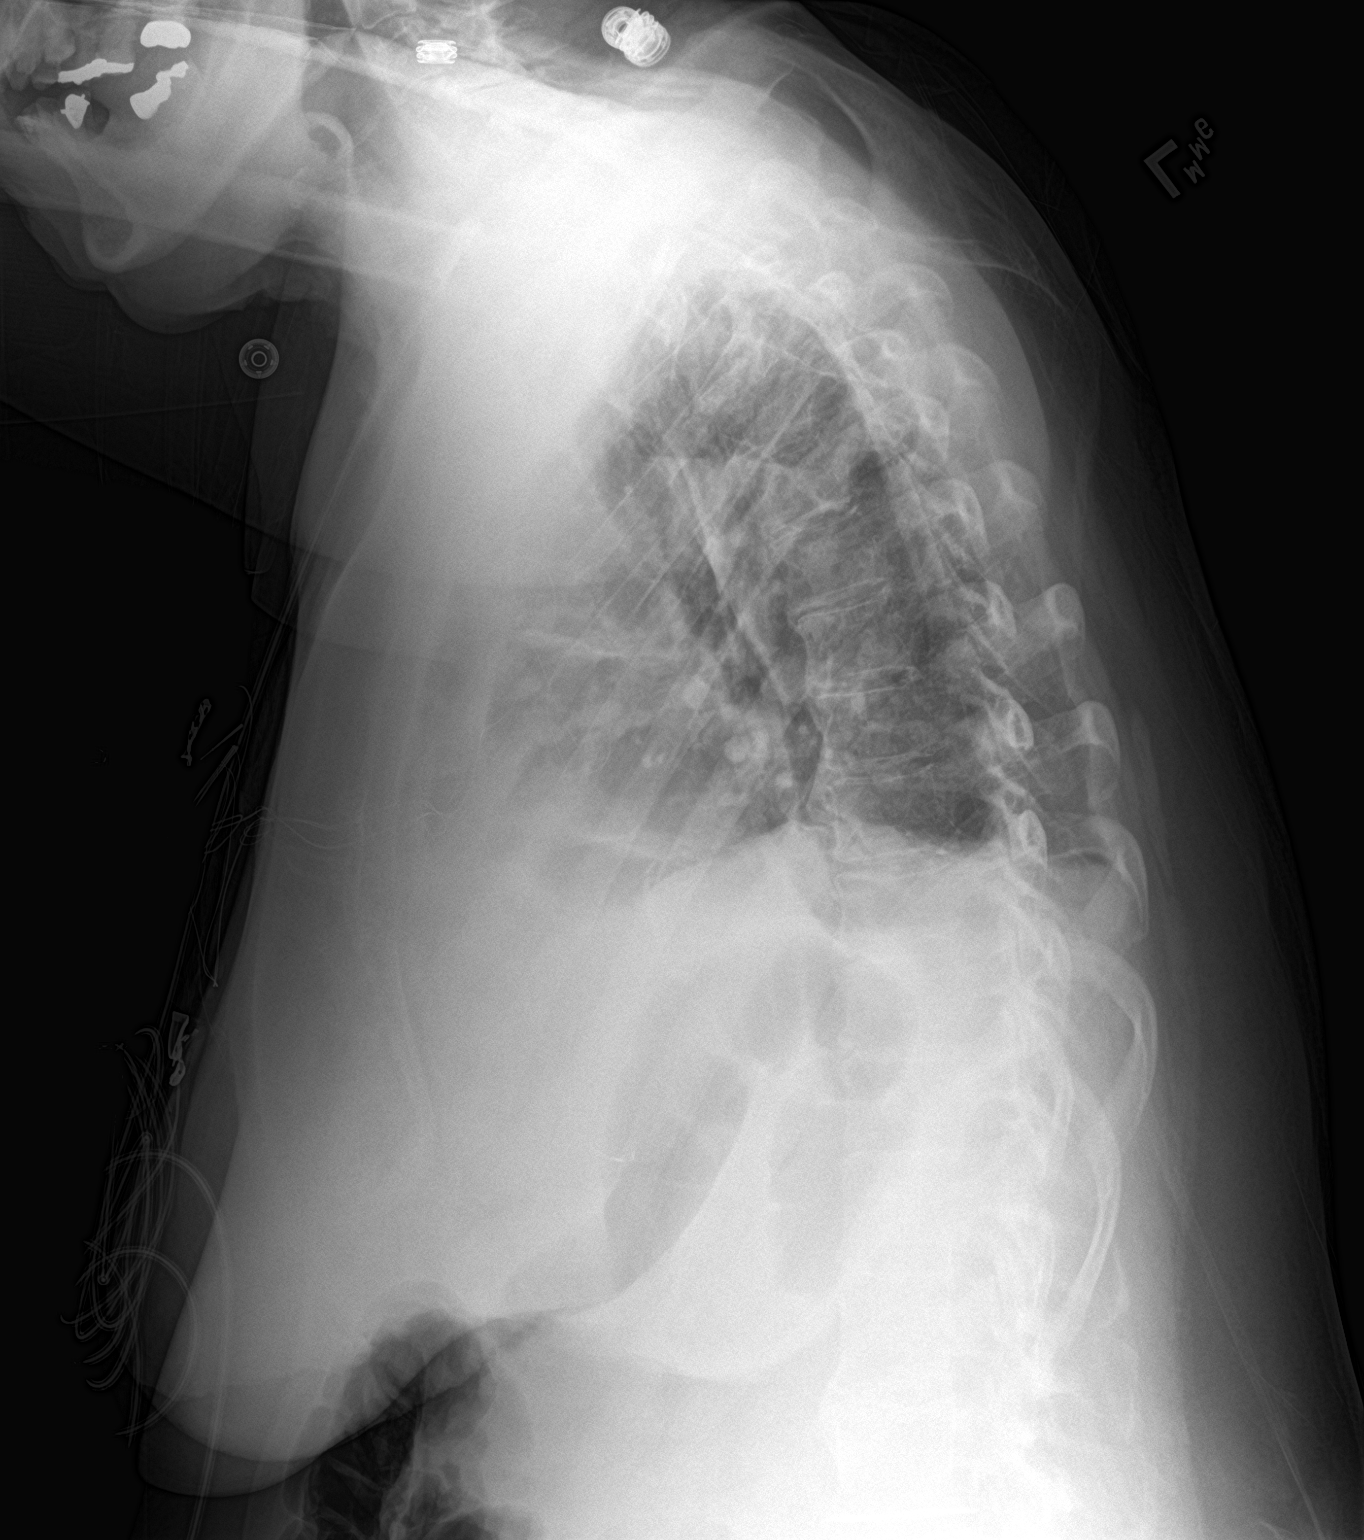

[2 of 2 positions shown; findings below may reference images not displayed]

FINDINGS: No pneumothorax. Nodular density in the right upper lobe not seen on
the patient's July 08, 2016 CT scan or July 09, 2016 chest x-ray
is likely associated with an adjacent EKG lead. Focal atelectasis is
possible. No other nodules or masses. Small bilateral effusions and
atelectasis, greater on the right than the left. No focal
infiltrate. No overt edema. The cardiomediastinal silhouette is
stable.
IMPRESSION: 1. Nodular density projected over the right upper lobe was not
present prior to surgery on July 09, 2016 and is likely either
associated with the EKG lead or represents focal atelectasis or
infiltrate. Recommend a PA and lateral chest x-ray without the EKG
leads before discharge.
2. Persistent small pleural effusions with underlying atelectasis,
right greater than left.

## 2017-09-05 DIAGNOSIS — J449 Chronic obstructive pulmonary disease, unspecified: Secondary | ICD-10-CM | POA: Diagnosis not present

## 2017-09-07 ENCOUNTER — Telehealth: Payer: Self-pay | Admitting: Internal Medicine

## 2017-09-07 ENCOUNTER — Other Ambulatory Visit: Payer: Self-pay | Admitting: Pharmacist

## 2017-09-07 ENCOUNTER — Other Ambulatory Visit: Payer: Self-pay

## 2017-09-07 DIAGNOSIS — J9611 Chronic respiratory failure with hypoxia: Secondary | ICD-10-CM | POA: Diagnosis not present

## 2017-09-07 MED ORDER — ALBUTEROL SULFATE HFA 108 (90 BASE) MCG/ACT IN AERS: 2.0000 | INHALATION_SPRAY | Freq: Four times a day (QID) | RESPIRATORY_TRACT | 5 refills | Status: DC | PRN

## 2017-09-07 NOTE — Patient Outreach (Signed)
[facility]) Care Management  09/07/2017  Lauren Bartlett 1955/10/19 [id number]   Incoming call from [name] in response to the [facility] Medication Adherence Campaign. Speak with patient. HIPAA identifiers verified and verbal consent received.  [name] reports that she takes her losartan 50 mg once daily as directed. Denies any missed doses or barriers to adherence. Counsel patient on the importance of medication adherence. [name] reports that she does use a pillbox as a medication adherence tool.  [name] reports that she has just noticed that she is out of puffs of her albuterol rescue inhaler. Reports that she had ordered a refill from [facility], but that it has not come. Discuss with patient the importance of having her rescue inhaler. Let patient know that I will call her PCP to request that a refill be called into her local [place] so that she can pick this up sooner and not be without her rescue inhaler.  [name] denies any further medication questions/concerns at this time.  PLAN  1) Will call to follow up with Dr. Lupita Dawn office to ask that provider call in a refill of the patient's albuterol rescue inhaler to patient's local [facility].  2) Will close pharmacy episode.  Harlow Asa, PharmD, [facility] [phone]

## 2017-09-07 NOTE — Patient Outreach (Signed)
[facility]) Care Management  09/07/2017  Lauren Bartlett [birth year] [id number]  Call to follow up with Dr. Lupita Dawn office to ask that provider call in a refill of the patient's albuterol rescue inhaler to patient's local [place]. Leave a message with Anderson Malta in Dr. Lupita Dawn office for request.  Harlow Asa, PharmD, [facility] [phone]

## 2017-09-07 NOTE — Telephone Encounter (Signed)
Copied from [place] [id number]. Topic: Quick Communication - Rx Refill/Question >> Sep 07, 2017 11:25 AM Synthia Innocent wrote: Medication: albuterol (PROVENTIL HFA;VENTOLIN HFA) 108 (90 Base) MCG/ACT inhaler   Has the patient contacted their pharmacy? Yes.  [facility] calling  (Agent: If no, request that the patient contact the pharmacy for the refill.) (Agent: If yes, when and what did the pharmacy advise?)  Preferred Pharmacy (with phone number or street name): [facility] in [place] 30 day supply, 90day supply [facility]  Agent: Please be advised that RX refills may take up to 3 business days. We ask that you follow-up with your pharmacy.

## 2017-09-08 ENCOUNTER — Other Ambulatory Visit: Payer: Self-pay | Admitting: Internal Medicine

## 2017-09-08 DIAGNOSIS — J9611 Chronic respiratory failure with hypoxia: Secondary | ICD-10-CM | POA: Diagnosis not present

## 2017-09-09 DIAGNOSIS — J9611 Chronic respiratory failure with hypoxia: Secondary | ICD-10-CM | POA: Diagnosis not present

## 2017-09-10 DIAGNOSIS — J9611 Chronic respiratory failure with hypoxia: Secondary | ICD-10-CM | POA: Diagnosis not present

## 2017-09-11 DIAGNOSIS — J9611 Chronic respiratory failure with hypoxia: Secondary | ICD-10-CM | POA: Diagnosis not present

## 2017-09-12 DIAGNOSIS — J9611 Chronic respiratory failure with hypoxia: Secondary | ICD-10-CM | POA: Diagnosis not present

## 2017-09-14 DIAGNOSIS — J9611 Chronic respiratory failure with hypoxia: Secondary | ICD-10-CM | POA: Diagnosis not present

## 2017-09-15 DIAGNOSIS — J9611 Chronic respiratory failure with hypoxia: Secondary | ICD-10-CM | POA: Diagnosis not present

## 2017-09-16 DIAGNOSIS — J9611 Chronic respiratory failure with hypoxia: Secondary | ICD-10-CM | POA: Diagnosis not present

## 2017-09-17 DIAGNOSIS — J9611 Chronic respiratory failure with hypoxia: Secondary | ICD-10-CM | POA: Diagnosis not present

## 2017-09-18 DIAGNOSIS — J9611 Chronic respiratory failure with hypoxia: Secondary | ICD-10-CM | POA: Diagnosis not present

## 2017-09-19 DIAGNOSIS — J9611 Chronic respiratory failure with hypoxia: Secondary | ICD-10-CM | POA: Diagnosis not present

## 2017-09-21 DIAGNOSIS — J9611 Chronic respiratory failure with hypoxia: Secondary | ICD-10-CM | POA: Diagnosis not present

## 2017-09-22 DIAGNOSIS — J9611 Chronic respiratory failure with hypoxia: Secondary | ICD-10-CM | POA: Diagnosis not present

## 2017-09-23 DIAGNOSIS — J9611 Chronic respiratory failure with hypoxia: Secondary | ICD-10-CM | POA: Diagnosis not present

## 2017-09-24 DIAGNOSIS — J9611 Chronic respiratory failure with hypoxia: Secondary | ICD-10-CM | POA: Diagnosis not present

## 2017-09-25 DIAGNOSIS — H04123 Dry eye syndrome of bilateral lacrimal glands: Secondary | ICD-10-CM | POA: Diagnosis not present

## 2017-09-25 DIAGNOSIS — J9611 Chronic respiratory failure with hypoxia: Secondary | ICD-10-CM | POA: Diagnosis not present

## 2017-09-28 DIAGNOSIS — J9611 Chronic respiratory failure with hypoxia: Secondary | ICD-10-CM | POA: Diagnosis not present

## 2017-09-29 ENCOUNTER — Ambulatory Visit (INDEPENDENT_AMBULATORY_CARE_PROVIDER_SITE_OTHER): Payer: Medicare HMO | Admitting: Family Medicine

## 2017-09-29 ENCOUNTER — Encounter: Payer: Self-pay | Admitting: Family Medicine

## 2017-09-29 VITALS — BP 122/78 | HR 81 | Temp 97.4°F | Resp 16 | Wt 234.0 lb

## 2017-09-29 DIAGNOSIS — R4589 Other symptoms and signs involving emotional state: Secondary | ICD-10-CM | POA: Diagnosis not present

## 2017-09-29 DIAGNOSIS — H938X3 Other specified disorders of ear, bilateral: Secondary | ICD-10-CM

## 2017-09-29 DIAGNOSIS — R300 Dysuria: Secondary | ICD-10-CM

## 2017-09-29 DIAGNOSIS — J9611 Chronic respiratory failure with hypoxia: Secondary | ICD-10-CM | POA: Diagnosis not present

## 2017-09-29 LAB — POCT URINALYSIS DIP (MANUAL ENTRY)
BILIRUBIN UA: NEGATIVE
BILIRUBIN UA: NEGATIVE mg/dL
GLUCOSE UA: NEGATIVE mg/dL
NITRITE UA: NEGATIVE
PH UA: 5.5 (ref 5.0–8.0)
Protein Ur, POC: NEGATIVE mg/dL
Spec Grav, UA: 1.01 (ref 1.010–1.025)
Urobilinogen, UA: 0.2 E.U./dL

## 2017-09-29 LAB — URINALYSIS, MICROSCOPIC ONLY: RBC / HPF: NONE SEEN (ref 0–?)

## 2017-09-29 MED ORDER — TRIMETHOPRIM 100 MG PO TABS: 100.0000 mg | ORAL_TABLET | Freq: Two times a day (BID) | ORAL | 0 refills | Status: DC

## 2017-09-29 NOTE — Progress Notes (Signed)
Patient ID: [name], female   DOB: 07/23/1955, 62 y.o.   MRN: [id number]    PCP: [name], MD  Subjective:  Lauren Bartlett is a 62 y.o. year old very pleasant female patient who presents with Urinary Tract symptoms: symptoms including dysuria, polyuria, frequency, and urgency. Associated strong urine odor -started: one week ago , symptoms are not improving. -previous treatments: No treatments have been tried at home -She reports drinking "very little water" and she states that her urine has been dark so she is trying to increase her water intake.   Ear fullness present x 1 week. She denies pain, drainage, rhinitis, sore throat, sinus pressure/pain. Associated mild congestion was present and has improved. No treatments have been tried at home.   ROS-denies fever, chills, sweats, N/V, flank pain, or blood in urine  Pertinent Past Medical History- HTN, Pulmonary HTN, Chronic diastolic CHF, COPD, Hypothyroidism, congenital absence of one kidney  Medications- reviewed  Current Outpatient Medications  Medication Sig Dispense Refill  . acetaminophen (TYLENOL) 500 MG tablet Take 500 mg by mouth 2 (two) times daily as needed for moderate pain or headache.    . albuterol (PROVENTIL HFA;VENTOLIN HFA) 108 (90 Base) MCG/ACT inhaler INHALE 2 PUFFS BY MOUTH INTO THE LUNGS EVERY 6 HOURS AS NEEDED FOR WHEEZING 8.5 g 6  . albuterol (PROVENTIL) (2.5 MG/3ML) 0.083% nebulizer solution Take 2.5 mg by nebulization every 6 (six) hours as needed for wheezing or shortness of breath.    [name] aspirin EC 81 MG EC tablet Take 1 tablet (81 mg total) by mouth daily.    . ergocalciferol (DRISDOL) 50000 units capsule Take 1 capsule (50,000 Units total) by mouth once a week. 4 capsule 11  . famotidine (PEPCID AC) 10 MG chewable tablet Chew 10 mg by mouth daily as needed for heartburn.    . ferrous sulfate 325 (65 FE) MG tablet Take 325 mg by mouth daily with breakfast.    . fluticasone (FLONASE) 50 MCG/ACT nasal  spray USE 2 SPRAYS IN EACH NOSTRIL EVERY DAY 48 g 2  . furosemide (LASIX) 20 MG tablet TAKE 1 TABLET EVERY DAY 90 tablet 1  . Hypromellose (ISOPTO TEARS) 0.5 % SOLN Apply 1 drop to eye 2 (two) times daily.    [name] levothyroxine (SYNTHROID, LEVOTHROID) 125 MCG tablet TAKE 1 TABLET EVERY DAY BEFORE BREAKFAST 90 tablet 1  . loratadine-pseudoephedrine (CLARITIN-D 24-HOUR) 10-240 MG 24 hr tablet Take 1 tablet by mouth daily.    [name] losartan (COZAAR) 50 MG tablet TAKE 1 TABLET EVERY DAY 90 tablet 1  . OXYGEN Inhale 2 L into the lungs at bedtime as needed (at bedtime and as needed).    [name] oxymetazoline (AFRIN) 0.05 % nasal spray Place 1 spray into both nostrils 2 (two) times daily. As needed for nose bleed    . polyethylene glycol powder (GLYCOLAX/MIRALAX) powder Take 17 g by mouth 2 (two) times daily as needed. (Patient taking differently: Take 17 g by mouth daily as needed for mild constipation. ) 3350 g 1  . traMADol (ULTRAM) 50 MG tablet Take 1-2 tablets (50-100 mg total) by mouth every 4 (four) hours as needed for moderate pain. 30 tablet 0  . umeclidinium-vilanterol (ANORO ELLIPTA) 62.5-25 MCG/INH AEPB Inhale 1 puff into the lungs daily. 1 each 11  . vitamin B-12 (CYANOCOBALAMIN) 1000 MCG tablet Take 1 tablet (1,000 mcg total) by mouth daily. 90 tablet 3   No current facility-administered medications for this visit.  Objective: BP 122/78 (BP Location: Left Arm, Patient Position: Sitting, Cuff Size: Large)   Pulse 81   Temp (!) 97.4 F (36.3 C) (Oral)   Resp 16   Wt 234 lb (106.1 kg)   SpO2 96%   BMI 42.80 kg/m  Gen: NAD, resting comfortably, she will not make eye contact; answers questions while staring at the floor HEENT: TMs normal bilaterally, no cerumen present, oropharynx clear and moist, no sinus tenderness CV: RRR no murmurs rubs or gallops Lungs: CTAB no crackles, wheeze, rhonchi Abdomen: soft/nontender/nondistended/normal bowel sounds. No rebound or guarding.  No CVA tenderness.    Suprapubic tenderness minimally present Ext: no edema Skin: warm, dry, no rash Neuro: grossly normal, moves all extremities, II-Visual fields grossly intact. III/IV/VI-Extraocular movements intact. Pupils reactive bilaterally. V/VII-Smile symmetric, equal eyebrow raise, facial sensation intact VIII- Hearing grossly intact XI-bilateral shoulder shrug XII-midline tongue extension Motor: 5/5 upper and lower extremities bilaterally Ambulates with a cane  Assessment/Plan:  1. Dysuria UA indicates +1 Leukocytes, small blood. History of symptoms and congential absence of kidney with exam today are concerning for developing UTI.  Will send for culture. Empiric treatment with trimethoprim 100 mg BID x 10 days due to allergy profile and chronic conditions present.   Advised patient to complete antibiotic and she should follow up if her symptoms do not improve in 3 to 4 days, worsen, she develops a fever >101, or back pain. Also advised her to increase water intake as she denies drinking water daily. Patient voiced understanding and agreed with plan. - POCT urinalysis dipstick - trimethoprim (TRIMPEX) 100 MG tablet; Take 1 tablet (100 mg total) by mouth 2 (two) times daily.  Dispense: 20 tablet; Refill: 0 - Urine Culture; Future - Urine Microscopic - Urine Culture  2. Sensation of fullness in both ears Ear exam unremarkable bilaterally. After removal and replacement of hearing aids during exam, patient reports that fullness has improved. We discussed symptoms to monitor for including any pain, fever, chills, or drainage. She will follow up if she notices any worsening symptoms.   3. Flat affect History of depression present; she denies any suicidal/homicidal ideation. She reports improvement in mood. She has been followed by PCP for grief reaction with prolonged bereavement who has recommended follow up. Patient verbalizes understanding and will follow up if needed. Neuro exam is reassuring  today; patient made eye contact at the end of the visit and smiled one time prior to leaving.   Finally, we reviewed reasons to return to care including if symptoms worsen or persist or new concerns arise- once again particularly fever, N/V, or flank pain.    Laurita Quint, FNP

## 2017-09-29 NOTE — Patient Instructions (Addendum)
Please complete antibiotic as directed. If you develop symptoms of fever >101, pain in your back, or do not improve with treatment that has been provided in 3 to 4 days, please follow up for further evaluation and treatment.   Please drink water which is important for improvement of your symptoms.   Dysuria Dysuria is pain or discomfort while urinating. The pain or discomfort may be felt in the tube that carries urine out of the bladder (urethra) or in the surrounding tissue of the genitals. The pain may also be felt in the groin area, lower abdomen, and lower back. You may have to urinate frequently or have the sudden feeling that you have to urinate (urgency). Dysuria can affect both men and women, but is more common in women. Dysuria can be caused by many different things, including:  Urinary tract infection in women.  Infection of the kidney or bladder.  Kidney stones or bladder stones.  Certain sexually transmitted infections (STIs), such as chlamydia.  Dehydration.  Inflammation of the vagina.  Use of certain medicines.  Use of certain soaps or scented products that cause irritation.  Follow these instructions at home: Watch your dysuria for any changes. The following actions may help to reduce any discomfort you are feeling:  Drink enough fluid to keep your urine clear or pale yellow.  Empty your bladder often. Avoid holding urine for long periods of time.  After a bowel movement or urination, women should cleanse from front to back, using each tissue only once.  Empty your bladder after sexual intercourse.  Take medicines only as directed by your health care provider.  If you were prescribed an antibiotic medicine, finish it all even if you start to feel better.  Avoid caffeine, tea, and alcohol. They can irritate the bladder and make dysuria worse. In men, alcohol may irritate the prostate.  Keep all follow-up visits as directed by your health care provider. This  is important.  If you had any tests done to find the cause of dysuria, it is your responsibility to obtain your test results. Ask the lab or department performing the test when and how you will get your results. Talk with your health care provider if you have any questions about your results.  Contact a health care provider if:  You develop pain in your back or sides.  You have a fever.  You have nausea or vomiting.  You have blood in your urine.  You are not urinating as often as you usually do. Get help right away if:  You pain is severe and not relieved with medicines.  You are unable to hold down any fluids.  You or someone else notices a change in your mental function.  You have a rapid heartbeat at rest.  You have shaking or chills.  You feel extremely weak. This information is not intended to replace advice given to you by your health care provider. Make sure you discuss any questions you have with your health care provider. Document Released: 12/14/2003 Document Revised: 08/23/2015 Document Reviewed: 11/10/2013 Elsevier Interactive Patient Education  [employer].

## 2017-09-29 NOTE — Progress Notes (Signed)
Lauren Bartlett

## 2017-09-30 DIAGNOSIS — J9611 Chronic respiratory failure with hypoxia: Secondary | ICD-10-CM | POA: Diagnosis not present

## 2017-10-01 DIAGNOSIS — J9611 Chronic respiratory failure with hypoxia: Secondary | ICD-10-CM | POA: Diagnosis not present

## 2017-10-01 LAB — URINE CULTURE
MICRO NUMBER:: 90787074
SPECIMEN QUALITY: ADEQUATE

## 2017-10-02 DIAGNOSIS — J9611 Chronic respiratory failure with hypoxia: Secondary | ICD-10-CM | POA: Diagnosis not present

## 2017-10-03 DIAGNOSIS — J9611 Chronic respiratory failure with hypoxia: Secondary | ICD-10-CM | POA: Diagnosis not present

## 2017-10-05 ENCOUNTER — Ambulatory Visit: Payer: Medicaid Other | Admitting: Pulmonary Disease

## 2017-10-05 DIAGNOSIS — J449 Chronic obstructive pulmonary disease, unspecified: Secondary | ICD-10-CM | POA: Diagnosis not present

## 2017-10-05 DIAGNOSIS — J9611 Chronic respiratory failure with hypoxia: Secondary | ICD-10-CM | POA: Diagnosis not present

## 2017-10-06 DIAGNOSIS — J9611 Chronic respiratory failure with hypoxia: Secondary | ICD-10-CM | POA: Diagnosis not present

## 2017-10-07 DIAGNOSIS — J9611 Chronic respiratory failure with hypoxia: Secondary | ICD-10-CM | POA: Diagnosis not present

## 2017-10-08 DIAGNOSIS — J9611 Chronic respiratory failure with hypoxia: Secondary | ICD-10-CM | POA: Diagnosis not present

## 2017-10-09 DIAGNOSIS — J9611 Chronic respiratory failure with hypoxia: Secondary | ICD-10-CM | POA: Diagnosis not present

## 2017-10-10 DIAGNOSIS — J9611 Chronic respiratory failure with hypoxia: Secondary | ICD-10-CM | POA: Diagnosis not present

## 2017-10-12 DIAGNOSIS — J9611 Chronic respiratory failure with hypoxia: Secondary | ICD-10-CM | POA: Diagnosis not present

## 2017-10-13 DIAGNOSIS — J9611 Chronic respiratory failure with hypoxia: Secondary | ICD-10-CM | POA: Diagnosis not present

## 2017-10-14 DIAGNOSIS — J9611 Chronic respiratory failure with hypoxia: Secondary | ICD-10-CM | POA: Diagnosis not present

## 2017-10-15 DIAGNOSIS — J9611 Chronic respiratory failure with hypoxia: Secondary | ICD-10-CM | POA: Diagnosis not present

## 2017-10-16 DIAGNOSIS — J9611 Chronic respiratory failure with hypoxia: Secondary | ICD-10-CM | POA: Diagnosis not present

## 2017-10-17 DIAGNOSIS — J9611 Chronic respiratory failure with hypoxia: Secondary | ICD-10-CM | POA: Diagnosis not present

## 2017-10-19 DIAGNOSIS — J9611 Chronic respiratory failure with hypoxia: Secondary | ICD-10-CM | POA: Diagnosis not present

## 2017-10-20 DIAGNOSIS — J9611 Chronic respiratory failure with hypoxia: Secondary | ICD-10-CM | POA: Diagnosis not present

## 2017-10-21 DIAGNOSIS — J9611 Chronic respiratory failure with hypoxia: Secondary | ICD-10-CM | POA: Diagnosis not present

## 2017-10-22 DIAGNOSIS — J9611 Chronic respiratory failure with hypoxia: Secondary | ICD-10-CM | POA: Diagnosis not present

## 2017-10-23 DIAGNOSIS — J9611 Chronic respiratory failure with hypoxia: Secondary | ICD-10-CM | POA: Diagnosis not present

## 2017-10-26 DIAGNOSIS — J9611 Chronic respiratory failure with hypoxia: Secondary | ICD-10-CM | POA: Diagnosis not present

## 2017-10-27 DIAGNOSIS — J9611 Chronic respiratory failure with hypoxia: Secondary | ICD-10-CM | POA: Diagnosis not present

## 2017-10-28 ENCOUNTER — Ambulatory Visit: Payer: Medicare HMO

## 2017-10-28 DIAGNOSIS — J9611 Chronic respiratory failure with hypoxia: Secondary | ICD-10-CM | POA: Diagnosis not present

## 2017-10-29 DIAGNOSIS — J9611 Chronic respiratory failure with hypoxia: Secondary | ICD-10-CM | POA: Diagnosis not present

## 2017-10-30 DIAGNOSIS — J9611 Chronic respiratory failure with hypoxia: Secondary | ICD-10-CM | POA: Diagnosis not present

## 2017-10-31 DIAGNOSIS — J9611 Chronic respiratory failure with hypoxia: Secondary | ICD-10-CM | POA: Diagnosis not present

## 2017-11-02 DIAGNOSIS — J9611 Chronic respiratory failure with hypoxia: Secondary | ICD-10-CM | POA: Diagnosis not present

## 2017-11-03 DIAGNOSIS — J9611 Chronic respiratory failure with hypoxia: Secondary | ICD-10-CM | POA: Diagnosis not present

## 2017-11-04 DIAGNOSIS — J9611 Chronic respiratory failure with hypoxia: Secondary | ICD-10-CM | POA: Diagnosis not present

## 2017-11-05 ENCOUNTER — Ambulatory Visit
Admission: RE | Admit: 2017-11-05 | Discharge: 2017-11-05 | Disposition: A | Discharge status: Home / Self Care | Payer: Medicare HMO | Source: Ambulatory Visit | Attending: Internal Medicine | Admitting: Internal Medicine

## 2017-11-05 DIAGNOSIS — J9611 Chronic respiratory failure with hypoxia: Secondary | ICD-10-CM | POA: Diagnosis not present

## 2017-11-05 DIAGNOSIS — Z1231 Encounter for screening mammogram for malignant neoplasm of breast: Secondary | ICD-10-CM | POA: Diagnosis not present

## 2017-11-05 DIAGNOSIS — J449 Chronic obstructive pulmonary disease, unspecified: Secondary | ICD-10-CM | POA: Diagnosis not present

## 2017-11-05 DIAGNOSIS — Z1239 Encounter for other screening for malignant neoplasm of breast: Secondary | ICD-10-CM

## 2017-11-06 DIAGNOSIS — J9611 Chronic respiratory failure with hypoxia: Secondary | ICD-10-CM | POA: Diagnosis not present

## 2017-11-07 DIAGNOSIS — J9611 Chronic respiratory failure with hypoxia: Secondary | ICD-10-CM | POA: Diagnosis not present

## 2017-11-09 DIAGNOSIS — J9611 Chronic respiratory failure with hypoxia: Secondary | ICD-10-CM | POA: Diagnosis not present

## 2017-11-10 DIAGNOSIS — J9611 Chronic respiratory failure with hypoxia: Secondary | ICD-10-CM | POA: Diagnosis not present

## 2017-11-11 DIAGNOSIS — J9611 Chronic respiratory failure with hypoxia: Secondary | ICD-10-CM | POA: Diagnosis not present

## 2017-11-12 DIAGNOSIS — J9611 Chronic respiratory failure with hypoxia: Secondary | ICD-10-CM | POA: Diagnosis not present

## 2017-11-13 DIAGNOSIS — J9611 Chronic respiratory failure with hypoxia: Secondary | ICD-10-CM | POA: Diagnosis not present

## 2017-11-14 DIAGNOSIS — J9611 Chronic respiratory failure with hypoxia: Secondary | ICD-10-CM | POA: Diagnosis not present

## 2017-11-16 DIAGNOSIS — J9611 Chronic respiratory failure with hypoxia: Secondary | ICD-10-CM | POA: Diagnosis not present

## 2017-11-17 DIAGNOSIS — J9611 Chronic respiratory failure with hypoxia: Secondary | ICD-10-CM | POA: Diagnosis not present

## 2017-11-18 DIAGNOSIS — J9611 Chronic respiratory failure with hypoxia: Secondary | ICD-10-CM | POA: Diagnosis not present

## 2017-11-19 DIAGNOSIS — J9611 Chronic respiratory failure with hypoxia: Secondary | ICD-10-CM | POA: Diagnosis not present

## 2017-11-20 DIAGNOSIS — J9611 Chronic respiratory failure with hypoxia: Secondary | ICD-10-CM | POA: Diagnosis not present

## 2017-11-23 DIAGNOSIS — J9611 Chronic respiratory failure with hypoxia: Secondary | ICD-10-CM | POA: Diagnosis not present

## 2017-11-24 DIAGNOSIS — J9611 Chronic respiratory failure with hypoxia: Secondary | ICD-10-CM | POA: Diagnosis not present

## 2017-11-25 DIAGNOSIS — J9611 Chronic respiratory failure with hypoxia: Secondary | ICD-10-CM | POA: Diagnosis not present

## 2017-11-26 ENCOUNTER — Ambulatory Visit (INDEPENDENT_AMBULATORY_CARE_PROVIDER_SITE_OTHER): Payer: Medicare HMO | Admitting: Pulmonary Disease

## 2017-11-26 ENCOUNTER — Encounter: Payer: Self-pay | Admitting: Pulmonary Disease

## 2017-11-26 VITALS — BP 138/98 | HR 96 | Ht 62.0 in | Wt 233.0 lb

## 2017-11-26 DIAGNOSIS — J9611 Chronic respiratory failure with hypoxia: Secondary | ICD-10-CM | POA: Diagnosis not present

## 2017-11-26 DIAGNOSIS — J449 Chronic obstructive pulmonary disease, unspecified: Secondary | ICD-10-CM | POA: Diagnosis not present

## 2017-11-26 DIAGNOSIS — G4734 Idiopathic sleep related nonobstructive alveolar hypoventilation: Secondary | ICD-10-CM

## 2017-11-26 NOTE — Progress Notes (Signed)
PULMONARY OFFICE FOLLOW UP NOTE  Requesting MD/Service: Derrel Nip  Date of initial consultation: 03/07/16  Reason for consultation: tracheobronchomalacia, DOE  PT PROFILE: 62 y.o. F never smoker with significant lifetime exposure to second hand smoke with multiple medical problems including chronic hypoxic respiratory failure, moderate COPD, mitral valve prolapse and regurgitation, pulm htn by echocardiogram, appearance of tracheomalacia on CT chest previously followed by Dr Raul Del and referred for further evaluation of severe exertional limitation. Initial eval suggested severe mitral regurgitation and impression was that this was a major contributor to her severe DOE  DATA: Echocardiogram 02/01/13: Normal LVEF. MVP with moderate MR, Moderate AI, severe TI. RVSP est 91 mmHg CT chest 07/29/15: Cardiomegaly with left atrial dilatation. Tracheobronchomalacia. Air trapping in the lungs bilaterally, indicative of small airways disease Echocardiogram 06/13/14: Normal LVEF, Severe MR, RVSP est 62 mmHg Spirometry 03/12/15: FVC 1.94 L (73% pred). FEV1 1.19 L (55% pred), FEV1/FVC 61% 6MWT 03/12/15: 225 meters, no desaturation LE venous US 07/29/15: No DVT Echocardiogram 07/31/15: LVEF 60-65%. MV poorly visualized, trivial MR, LA size normal, TV poorly visualized, RVSP not estimated CT chest 03/11/16: Stable changes of tracheobronchomalacia Echocardiogram 04/15/16: Normal LVEF, Severe MR, moderate PAH Mitral valvuloplasty 07/17/16: @ [place] by Dr Roxy Manns Echocardiogram 09/15/16: LVEF 60-65%. RVSP estimate 54 mmHg ONO 10/21/16: on RA. 45 mins < 89%, lowest SpO2 84% PFTs 03/26/17: FVC: 1.73 > 1.74 L (63 %pred), FEV1: 1.01 > 1.16 L (45> 52%pred, 15% improvement after bronchodilator), FEV1/FVC: 59 >67%, TLC: 3.27 L (73 %pred), DLCO 46 %pred, DLCO/VA 103%   INTERVAL:  Last seen 04/24/2017.  No major pulmonary problems since that time.  SUBJ::  This is a scheduled office of evaluation.  She has no new  complaints.  Overall, she remains significantly improved after mitral valvuloplasty performed in April 2018.  She continues to use Anoro inhaler daily.  She almost never requires her albuterol rescue inhaler.  She remains on oxygen with sleep and is wearing it compliantly.  She denies CP, fever, purulent sputum, hemoptysis, LE edema and calf tenderness.  Last visit, I provided a sample of an inhaled corticosteroid.  She used a sample but did not discern any benefit from it.  OBJ:  Vitals:   11/26/17 1032 11/26/17 1034  BP:  (!) 138/98  Pulse:  96  SpO2:  96%  Weight: 233 lb (105.7 kg)   Height: 5\' 2"  (1.575 m)   RA   EXAM:  Gen: NAD, moderately obese HEENT: NCAT, sclera white Neck: No JVD Lungs: breath sounds full, no wheezes or other adventitious sounds Cardiovascular: RRR, no murmurs Abdomen: Soft, nontender, normal BS Ext: without clubbing, cyanosis, edema Neuro: grossly intact Skin: Limited exam, no lesions noted   DATA:   BMP Latest Ref Rng & Units 07/30/2017 11/06/2016 10/20/2016  Glucose 70 - 99 mg/dL 69(L) 87 105(H)  BUN 6 - 23 mg/dL 13 16 27(H)  Creatinine 0.40 - 1.20 mg/dL 0.81 0.91 0.92  BUN/Creat Ratio 12 - 28 - - -  Sodium 135 - 145 mEq/L 143 141 140  Potassium 3.5 - 5.1 mEq/L 3.8 3.9 4.2  Chloride 96 - 112 mEq/L 105 101 101  CO2 19 - 32 mEq/L 29 34(H) 29  Calcium 8.4 - 10.5 mg/dL 8.1(L) 8.8 8.3(L)    CBC Latest Ref Rng & Units 10/20/2016 08/13/2016 07/21/2016  WBC 3.6 - 11.0 K/uL 9.5 8.6 10.9(H)  Hemoglobin 12.0 - 16.0 g/dL 13.7 10.4(L) 9.0(L)  Hematocrit 35.0 - 47.0 % 42.3 31.7(L) 28.0(L)  Platelets 150 - 440  K/uL 200 225 122(L)    CXR: NNF  IMPRESSION:     ICD-10-CM   1. COPD, moderate ([place]) J44.9   2. Nocturnal hypoxemia G47.34      PLAN:  Continue Anoro inhaler daily Continue albuterol as needed Continue oxygen with sleep Follow-up in 1 year.  Call sooner if needed   Merton Border, MD PCCM service Mobile [phone] Pager  [phone] 11/26/2017 12:39 PM

## 2017-11-26 NOTE — Patient Instructions (Signed)
Continue Anoro inhaler daily Continue albuterol as needed Continue oxygen with sleep Follow-up in 1 year.  Call sooner if needed

## 2017-11-27 DIAGNOSIS — J9611 Chronic respiratory failure with hypoxia: Secondary | ICD-10-CM | POA: Diagnosis not present

## 2017-11-28 DIAGNOSIS — J9611 Chronic respiratory failure with hypoxia: Secondary | ICD-10-CM | POA: Diagnosis not present

## 2017-11-30 DIAGNOSIS — J9611 Chronic respiratory failure with hypoxia: Secondary | ICD-10-CM | POA: Diagnosis not present

## 2017-12-01 DIAGNOSIS — J9611 Chronic respiratory failure with hypoxia: Secondary | ICD-10-CM | POA: Diagnosis not present

## 2017-12-02 DIAGNOSIS — J9611 Chronic respiratory failure with hypoxia: Secondary | ICD-10-CM | POA: Diagnosis not present

## 2017-12-03 DIAGNOSIS — J9611 Chronic respiratory failure with hypoxia: Secondary | ICD-10-CM | POA: Diagnosis not present

## 2017-12-04 DIAGNOSIS — J9611 Chronic respiratory failure with hypoxia: Secondary | ICD-10-CM | POA: Diagnosis not present

## 2017-12-05 DIAGNOSIS — J9611 Chronic respiratory failure with hypoxia: Secondary | ICD-10-CM | POA: Diagnosis not present

## 2017-12-06 DIAGNOSIS — J449 Chronic obstructive pulmonary disease, unspecified: Secondary | ICD-10-CM | POA: Diagnosis not present

## 2017-12-14 DIAGNOSIS — J9611 Chronic respiratory failure with hypoxia: Secondary | ICD-10-CM | POA: Diagnosis not present

## 2017-12-15 DIAGNOSIS — J9611 Chronic respiratory failure with hypoxia: Secondary | ICD-10-CM | POA: Diagnosis not present

## 2017-12-16 DIAGNOSIS — J9611 Chronic respiratory failure with hypoxia: Secondary | ICD-10-CM | POA: Diagnosis not present

## 2017-12-17 DIAGNOSIS — J9611 Chronic respiratory failure with hypoxia: Secondary | ICD-10-CM | POA: Diagnosis not present

## 2017-12-18 DIAGNOSIS — J9611 Chronic respiratory failure with hypoxia: Secondary | ICD-10-CM | POA: Diagnosis not present

## 2017-12-19 DIAGNOSIS — J9611 Chronic respiratory failure with hypoxia: Secondary | ICD-10-CM | POA: Diagnosis not present

## 2017-12-21 DIAGNOSIS — J9611 Chronic respiratory failure with hypoxia: Secondary | ICD-10-CM | POA: Diagnosis not present

## 2017-12-22 DIAGNOSIS — J9611 Chronic respiratory failure with hypoxia: Secondary | ICD-10-CM | POA: Diagnosis not present

## 2017-12-23 DIAGNOSIS — J9611 Chronic respiratory failure with hypoxia: Secondary | ICD-10-CM | POA: Diagnosis not present

## 2017-12-24 DIAGNOSIS — J9611 Chronic respiratory failure with hypoxia: Secondary | ICD-10-CM | POA: Diagnosis not present

## 2017-12-25 DIAGNOSIS — J9611 Chronic respiratory failure with hypoxia: Secondary | ICD-10-CM | POA: Diagnosis not present

## 2017-12-26 DIAGNOSIS — J9611 Chronic respiratory failure with hypoxia: Secondary | ICD-10-CM | POA: Diagnosis not present

## 2017-12-28 DIAGNOSIS — J9611 Chronic respiratory failure with hypoxia: Secondary | ICD-10-CM | POA: Diagnosis not present

## 2017-12-29 DIAGNOSIS — J9611 Chronic respiratory failure with hypoxia: Secondary | ICD-10-CM | POA: Diagnosis not present

## 2017-12-30 DIAGNOSIS — J9611 Chronic respiratory failure with hypoxia: Secondary | ICD-10-CM | POA: Diagnosis not present

## 2017-12-31 DIAGNOSIS — J9611 Chronic respiratory failure with hypoxia: Secondary | ICD-10-CM | POA: Diagnosis not present

## 2018-01-01 DIAGNOSIS — J9611 Chronic respiratory failure with hypoxia: Secondary | ICD-10-CM | POA: Diagnosis not present

## 2018-01-02 DIAGNOSIS — J9611 Chronic respiratory failure with hypoxia: Secondary | ICD-10-CM | POA: Diagnosis not present

## 2018-01-04 DIAGNOSIS — J9611 Chronic respiratory failure with hypoxia: Secondary | ICD-10-CM | POA: Diagnosis not present

## 2018-01-05 DIAGNOSIS — J9611 Chronic respiratory failure with hypoxia: Secondary | ICD-10-CM | POA: Diagnosis not present

## 2018-01-05 DIAGNOSIS — J449 Chronic obstructive pulmonary disease, unspecified: Secondary | ICD-10-CM | POA: Diagnosis not present

## 2018-01-06 DIAGNOSIS — J9611 Chronic respiratory failure with hypoxia: Secondary | ICD-10-CM | POA: Diagnosis not present

## 2018-01-07 DIAGNOSIS — J9611 Chronic respiratory failure with hypoxia: Secondary | ICD-10-CM | POA: Diagnosis not present

## 2018-01-08 DIAGNOSIS — J9611 Chronic respiratory failure with hypoxia: Secondary | ICD-10-CM | POA: Diagnosis not present

## 2018-01-09 DIAGNOSIS — J9611 Chronic respiratory failure with hypoxia: Secondary | ICD-10-CM | POA: Diagnosis not present

## 2018-01-11 DIAGNOSIS — J9611 Chronic respiratory failure with hypoxia: Secondary | ICD-10-CM | POA: Diagnosis not present

## 2018-01-12 DIAGNOSIS — J9611 Chronic respiratory failure with hypoxia: Secondary | ICD-10-CM | POA: Diagnosis not present

## 2018-01-13 DIAGNOSIS — J9611 Chronic respiratory failure with hypoxia: Secondary | ICD-10-CM | POA: Diagnosis not present

## 2018-01-14 DIAGNOSIS — J9611 Chronic respiratory failure with hypoxia: Secondary | ICD-10-CM | POA: Diagnosis not present

## 2018-01-15 DIAGNOSIS — J9611 Chronic respiratory failure with hypoxia: Secondary | ICD-10-CM | POA: Diagnosis not present

## 2018-01-16 DIAGNOSIS — J9611 Chronic respiratory failure with hypoxia: Secondary | ICD-10-CM | POA: Diagnosis not present

## 2018-01-18 DIAGNOSIS — J9611 Chronic respiratory failure with hypoxia: Secondary | ICD-10-CM | POA: Diagnosis not present

## 2018-01-19 ENCOUNTER — Other Ambulatory Visit: Payer: Self-pay | Admitting: Internal Medicine

## 2018-01-19 DIAGNOSIS — J9611 Chronic respiratory failure with hypoxia: Secondary | ICD-10-CM | POA: Diagnosis not present

## 2018-01-19 DIAGNOSIS — R42 Dizziness and giddiness: Secondary | ICD-10-CM

## 2018-01-19 DIAGNOSIS — I5032 Chronic diastolic (congestive) heart failure: Secondary | ICD-10-CM

## 2018-01-20 ENCOUNTER — Other Ambulatory Visit: Payer: Self-pay | Admitting: Internal Medicine

## 2018-01-20 DIAGNOSIS — J9611 Chronic respiratory failure with hypoxia: Secondary | ICD-10-CM | POA: Diagnosis not present

## 2018-01-21 DIAGNOSIS — J9611 Chronic respiratory failure with hypoxia: Secondary | ICD-10-CM | POA: Diagnosis not present

## 2018-01-22 DIAGNOSIS — J9611 Chronic respiratory failure with hypoxia: Secondary | ICD-10-CM | POA: Diagnosis not present

## 2018-01-25 DIAGNOSIS — J9611 Chronic respiratory failure with hypoxia: Secondary | ICD-10-CM | POA: Diagnosis not present

## 2018-01-26 DIAGNOSIS — J9611 Chronic respiratory failure with hypoxia: Secondary | ICD-10-CM | POA: Diagnosis not present

## 2018-01-27 DIAGNOSIS — J9611 Chronic respiratory failure with hypoxia: Secondary | ICD-10-CM | POA: Diagnosis not present

## 2018-01-28 DIAGNOSIS — J9611 Chronic respiratory failure with hypoxia: Secondary | ICD-10-CM | POA: Diagnosis not present

## 2018-01-29 DIAGNOSIS — J9611 Chronic respiratory failure with hypoxia: Secondary | ICD-10-CM | POA: Diagnosis not present

## 2018-01-30 DIAGNOSIS — J9611 Chronic respiratory failure with hypoxia: Secondary | ICD-10-CM | POA: Diagnosis not present

## 2018-02-01 DIAGNOSIS — J9611 Chronic respiratory failure with hypoxia: Secondary | ICD-10-CM | POA: Diagnosis not present

## 2018-02-02 DIAGNOSIS — J9611 Chronic respiratory failure with hypoxia: Secondary | ICD-10-CM | POA: Diagnosis not present

## 2018-02-03 ENCOUNTER — Telehealth: Payer: Self-pay | Admitting: Internal Medicine

## 2018-02-03 ENCOUNTER — Encounter: Payer: Self-pay | Admitting: Internal Medicine

## 2018-02-03 ENCOUNTER — Ambulatory Visit (INDEPENDENT_AMBULATORY_CARE_PROVIDER_SITE_OTHER): Payer: Medicare HMO | Admitting: Internal Medicine

## 2018-02-03 VITALS — BP 122/74 | HR 82 | Temp 97.9°F | Resp 16 | Ht 62.0 in | Wt 240.0 lb

## 2018-02-03 DIAGNOSIS — I10 Essential (primary) hypertension: Secondary | ICD-10-CM | POA: Diagnosis not present

## 2018-02-03 DIAGNOSIS — Q600 Renal agenesis, unilateral: Secondary | ICD-10-CM | POA: Diagnosis not present

## 2018-02-03 DIAGNOSIS — E559 Vitamin D deficiency, unspecified: Secondary | ICD-10-CM

## 2018-02-03 DIAGNOSIS — I5032 Chronic diastolic (congestive) heart failure: Secondary | ICD-10-CM | POA: Diagnosis not present

## 2018-02-03 DIAGNOSIS — Z6841 Body Mass Index (BMI) 40.0 and over, adult: Secondary | ICD-10-CM | POA: Diagnosis not present

## 2018-02-03 DIAGNOSIS — E669 Obesity, unspecified: Secondary | ICD-10-CM | POA: Diagnosis not present

## 2018-02-03 DIAGNOSIS — J449 Chronic obstructive pulmonary disease, unspecified: Secondary | ICD-10-CM

## 2018-02-03 DIAGNOSIS — F4381 Prolonged grief disorder: Secondary | ICD-10-CM

## 2018-02-03 DIAGNOSIS — E034 Atrophy of thyroid (acquired): Secondary | ICD-10-CM | POA: Diagnosis not present

## 2018-02-03 DIAGNOSIS — Z23 Encounter for immunization: Secondary | ICD-10-CM

## 2018-02-03 DIAGNOSIS — F4329 Adjustment disorder with other symptoms: Secondary | ICD-10-CM

## 2018-02-03 DIAGNOSIS — F4321 Adjustment disorder with depressed mood: Secondary | ICD-10-CM | POA: Diagnosis not present

## 2018-02-03 DIAGNOSIS — J42 Unspecified chronic bronchitis: Secondary | ICD-10-CM

## 2018-02-03 DIAGNOSIS — G4736 Sleep related hypoventilation in conditions classified elsewhere: Secondary | ICD-10-CM

## 2018-02-03 DIAGNOSIS — J9611 Chronic respiratory failure with hypoxia: Secondary | ICD-10-CM | POA: Diagnosis not present

## 2018-02-03 LAB — TSH: TSH: 2.42 u[IU]/mL (ref 0.35–4.50)

## 2018-02-03 LAB — COMPREHENSIVE METABOLIC PANEL
ALT: 14 U/L (ref 0–35)
AST: 17 U/L (ref 0–37)
Albumin: 4.2 g/dL (ref 3.5–5.2)
Alkaline Phosphatase: 66 U/L (ref 39–117)
BILIRUBIN TOTAL: 0.5 mg/dL (ref 0.2–1.2)
BUN: 15 mg/dL (ref 6–23)
CALCIUM: 8.4 mg/dL (ref 8.4–10.5)
CO2: 31 meq/L (ref 19–32)
CREATININE: 0.79 mg/dL (ref 0.40–1.20)
Chloride: 103 mEq/L (ref 96–112)
GFR: 78.34 mL/min (ref >60.00)
Glucose, Bld: 70 mg/dL (ref 70–99)
Potassium: 4.3 mEq/L (ref 3.5–5.1)
SODIUM: 141 meq/L (ref 135–145)
Total Protein: 6.8 g/dL (ref 6.0–8.3)

## 2018-02-03 NOTE — Assessment & Plan Note (Addendum)
Paying out of pocket $13/month due to Medicare non  reimbursement  .  Advised her to continue supplementing with whatever source is less costly:  OTC supplements 10,000 Ius  5 days per week  Or 7,000 Ius daily

## 2018-02-03 NOTE — Patient Instructions (Addendum)
Try usnig Ayr saline nasal gel nightly before your place your oxygen in your nose   If you can find an otc vitamin d for less than $13,   look for 5000 Ius and take daily   or 10, 000 Ius  And take for 5 days every week  I encourage you to keep walking as many days as possible for 30 minutes  Daily  If you walk outside, cover your nose and mouth with a scarf when the temperature is below 50 degrees: this will prevent old air from irritating your lungs

## 2018-02-03 NOTE — Progress Notes (Signed)
Subjective:  Patient ID: Lauren Bartlett, female    DOB: [birth year]  Age: 62 y.o. MRN: [id number]  CC: The primary encounter diagnosis was Chronic diastolic congestive heart failure ([place]). Diagnoses of Essential hypertension, Hypothyroidism due to acquired atrophy of thyroid, Vitamin D deficiency, Need for immunization against influenza, Grief reaction with prolonged bereavement, Congenital absence of one kidney, Chronic bronchitis, unspecified chronic bronchitis type ([place]), Obesity (BMI 30-39.9), and Nocturnal hypoxemia due to obstructive chronic bronchitis ([place]) were also pertinent to this visit.  HPI Lauren Bartlett presents for 6 month follow up on multiple chronic condtions  Including hypertension,  obseity chronic respiratory failure, hypothyroidism.  and grief  Hypertension: patient checks blood pressure twice weekly at home.  Readings have been for the most part < 140/80 at rest . Patient is not intentionally following a reduced salt diet due to financial limitations on food choices and  is taking medications as prescribed  She feels generally well except for recurrent early morning chest tightness .  She stopped exercising during the summer heat and has gained weight  .  Diiet reviewed:  Makes sandwiches.  Uses micro wave dinners if affordable.  Waiting for Meals on Wheels to accept her   Jello with fruit,  Yogurt   Still grieving loss of her father. Getting used to doing things by/for  Herself..  Anticipates that the  Holidays will be more difficult.   But she has an  adequate support system    Has several close riends to go out with .    Occasional insomnia. 2-3 nights per week due to worrying about being alone. [name]   Usiing oxygen at night for OSA.  Nose feels dry,  Has had some epistaxis but not recenlty.  Recommended daily use of  ayr saline nasal gel.  Recurrent Vitamin D deficiency .  She has been having to pay $12 monthly for her Vitamin D supplemets out of pocket    Outpatient  Medications Prior to Visit  Medication Sig Dispense Refill  . acetaminophen (TYLENOL) 500 MG tablet Take 500 mg by mouth 2 (two) times daily as needed for moderate pain or headache.    . albuterol (PROVENTIL HFA;VENTOLIN HFA) 108 (90 Base) MCG/ACT inhaler INHALE 2 PUFFS BY MOUTH INTO THE LUNGS EVERY 6 HOURS AS NEEDED FOR WHEEZING 8.5 g 6  . albuterol (PROVENTIL) (2.5 MG/3ML) 0.083% nebulizer solution Take 2.5 mg by nebulization every 6 (six) hours as needed for wheezing or shortness of breath.    Jearl Klinefelter ELLIPTA 62.5-25 MCG/INH AEPB INHALE 1 PUFF INTO THE LUNGS EVERY DAY 180 each 11  . aspirin EC 81 MG EC tablet Take 1 tablet (81 mg total) by mouth daily.    . ergocalciferol (DRISDOL) 50000 units capsule Take 1 capsule (50,000 Units total) by mouth once a week. 4 capsule 11  . famotidine (PEPCID AC) 10 MG chewable tablet Chew 10 mg by mouth daily as needed for heartburn.    . ferrous sulfate 325 (65 FE) MG tablet Take 325 mg by mouth daily with breakfast.    . fluticasone (FLONASE) 50 MCG/ACT nasal spray USE 2 SPRAYS IN EACH NOSTRIL EVERY DAY 48 g 2  . furosemide (LASIX) 20 MG tablet TAKE 1 TABLET EVERY DAY 90 tablet 1  . Hypromellose (ISOPTO TEARS) 0.5 % SOLN Apply 1 drop to eye 2 (two) times daily.    [name] levothyroxine (SYNTHROID, LEVOTHROID) 125 MCG tablet TAKE 1 TABLET EVERY DAY BEFORE BREAKFAST 90 tablet 1  .  loratadine-pseudoephedrine (CLARITIN-D 24-HOUR) 10-240 MG 24 hr tablet Take 1 tablet by mouth daily.    [name] losartan (COZAAR) 50 MG tablet TAKE 1 TABLET EVERY DAY 90 tablet 1  . OXYGEN Inhale 2 L into the lungs at bedtime as needed (at bedtime and as needed).    [name] oxymetazoline (AFRIN) 0.05 % nasal spray Place 1 spray into both nostrils 2 (two) times daily. As needed for nose bleed    . polyethylene glycol powder (GLYCOLAX/MIRALAX) powder Take 17 g by mouth 2 (two) times daily as needed. (Patient taking differently: Take 17 g by mouth daily as needed for mild constipation. ) 3350 g 1  .  traMADol (ULTRAM) 50 MG tablet Take 1-2 tablets (50-100 mg total) by mouth every 4 (four) hours as needed for moderate pain. 30 tablet 0  . trimethoprim (TRIMPEX) 100 MG tablet Take 1 tablet (100 mg total) by mouth 2 (two) times daily. 20 tablet 0  . vitamin B-12 (CYANOCOBALAMIN) 1000 MCG tablet Take 1 tablet (1,000 mcg total) by mouth daily. 90 tablet 3   No facility-administered medications prior to visit.     Review of Systems;  Patient denies headache, fevers, malaise, unintentional weight loss, skin rash, eye pain, sinus congestion and sinus pain, sore throat, dysphagia,  hemoptysis , cough, dyspnea, wheezing, chest pain, palpitations, orthopnea, edema, abdominal pain, nausea, melena, diarrhea, constipation, flank pain, dysuria, hematuria, urinary  Frequency, nocturia, numbness, tingling, seizures,  Focal weakness, Loss of consciousness,  Tremor, insomnia, depression, anxiety, and suicidal ideation.      Objective:  BP 122/74 (BP Location: Left Arm, Patient Position: Sitting, Cuff Size: Large)   Pulse 82   Temp 97.9 F (36.6 C) (Oral)   Resp 16   Ht 5\' 2"  (1.575 m)   Wt 240 lb (108.9 kg)   SpO2 97%   BMI 43.90 kg/m   BP Readings from Last 3 Encounters:  02/04/18 118/64  02/03/18 122/74  11/26/17 (!) 138/98    Wt Readings from Last 3 Encounters:  02/04/18 239 lb (108.4 kg)  02/03/18 240 lb (108.9 kg)  11/26/17 233 lb (105.7 kg)    General appearance: alert, cooperative and appears stated age Ears: normal TM's and external ear canals both ears Throat: lips, mucosa, and tongue normal; teeth and gums normal Neck: no adenopathy, no carotid bruit, supple, symmetrical, trachea midline and thyroid not enlarged, symmetric, no tenderness/mass/nodules Back: symmetric, no curvature. ROM normal. No CVA tenderness. Lungs: clear to auscultation bilaterally Heart: regular rate and rhythm, S1, S2 normal, no murmur, click, rub or gallop Abdomen: soft, non-tender; bowel sounds normal;  no masses,  no organomegaly Pulses: 2+ and symmetric Skin: Skin color, texture, turgor normal. No rashes or lesions Lymph nodes: Cervical, supraclavicular, and axillary nodes normal.  Lab Results  Component Value Date   HGBA1C 5.3 07/30/2017   HGBA1C 5.1 07/14/2016   HGBA1C 5.4 07/28/2015    Lab Results  Component Value Date   CREATININE 0.79 02/03/2018   CREATININE 0.81 07/30/2017   CREATININE 0.91 11/06/2016    Lab Results  Component Value Date   WBC 9.5 10/20/2016   HGB 13.7 10/20/2016   HCT 42.3 10/20/2016   PLT 200 10/20/2016   GLUCOSE 70 02/03/2018   CHOL 173 03/14/2015   TRIG 85.0 03/14/2015   HDL 57.10 03/14/2015   LDLDIRECT 101.0 07/30/2017   LDLCALC 99 03/14/2015   ALT 14 02/03/2018   AST 17 02/03/2018   NA 141 02/03/2018   K 4.3 02/03/2018   CL  103 02/03/2018   CREATININE 0.79 02/03/2018   BUN 15 02/03/2018   CO2 31 02/03/2018   TSH 2.42 02/03/2018   INR 1.4 08/26/2016   HGBA1C 5.3 07/30/2017    Mm 3d Screen Breast Bilateral  Result Date: 11/05/2017 CLINICAL DATA:  Screening. EXAM: DIGITAL SCREENING BILATERAL MAMMOGRAM WITH TOMO AND CAD COMPARISON:  Previous exam(s). ACR Breast Density Category b: There are scattered areas of fibroglandular density. FINDINGS: There are no findings suspicious for malignancy. Images were processed with CAD. IMPRESSION: No mammographic evidence of malignancy. A result letter of this screening mammogram will be mailed directly to the patient. RECOMMENDATION: Screening mammogram in one year. (Code:SM-B-01Y) BI-RADS CATEGORY  1: Negative. Electronically Signed   By: Kristopher Oppenheim M.D.   On: 11/05/2017 13:46    Assessment & Plan:   Problem List Items Addressed This Visit    Chronic diastolic congestive heart failure ([place]) - Primary (Chronic)    Managed with prn use of  lasix and losartan daily  Lab Results  Component Value Date   CREATININE 0.79 02/03/2018         Congenital absence of one kidney (Chronic)     Incidental finding  Of solitary right kidney,  Patent renal artery during workup in 2018 for respiratory failure and mitral valve regurgitation      COPD (chronic obstructive pulmonary disease) (H[facility]) (Chronic)    She has been having occasional chest tightness in the morning.  Exam is normal today , continue use of albuterol prn and Anoro daily       Grief reaction with prolonged bereavement    Due to loss of father in October of 2018. [name] Patient has adequate coping skills and emotional support .        Hypertension (Chronic)    Well controlled on current regimen. Renal function stable, no changes today.  Lab Results  Component Value Date   CREATININE 0.79 02/03/2018   Lab Results  Component Value Date   NA 141 02/03/2018   K 4.3 02/03/2018   CL 103 02/03/2018   CO2 31 02/03/2018         Relevant Orders   Comprehensive metabolic panel (Completed)   Hypothyroidism (Chronic)    Thyroid function is maximally active on current dose.  No current changes needed.   Lab Results  Component Value Date   TSH 2.42 02/03/2018         Relevant Orders   TSH (Completed)   Nocturnal hypoxemia due to obstructive chronic bronchitis (H[facility])    Continue nocturnal use of supplemental oxygen at 2 L/min      Obesity (BMI 30-39.9) (Chronic)    I have addressed  BMI and recommended wt loss of 10% of body weigh over the next 6 months using a low glycemic index diet and regular exercise a minimum of 5 days per week.        Vitamin D deficiency (Chronic)    Paying out of pocket $13/month due to Medicare non  reimbursement  .  Advised her to continue supplementing with whatever source is less costly:  OTC supplements 10,000 Ius  5 days per week  Or 7,000 Ius daily        Other Visit Diagnoses    Need for immunization against influenza       Relevant Orders   Flu Vaccine QUAD 36+ mos IM (Completed)     A total of 25 minutes of face to face time was spent with patient more than half  of  which was spent in counselling about the above mentioned conditions  and coordination of care    I am having Jocelyn Lamer B. Motton maintain her loratadine-pseudoephedrine, polyethylene glycol powder, OXYGEN, albuterol, famotidine, acetaminophen, Hypromellose, aspirin, ferrous sulfate, vitamin B-12, traMADol, oxymetazoline, fluticasone, ergocalciferol, albuterol, trimethoprim, levothyroxine, furosemide, losartan, and ANORO ELLIPTA.  No orders of the defined types were placed in this encounter.   There are no discontinued medications.  Follow-up: Return in about 6 months (around 08/04/2018) for CPE.   [name], MD

## 2018-02-04 ENCOUNTER — Encounter: Payer: Self-pay | Admitting: Cardiovascular Disease

## 2018-02-04 ENCOUNTER — Ambulatory Visit (INDEPENDENT_AMBULATORY_CARE_PROVIDER_SITE_OTHER): Payer: Medicare HMO | Admitting: Cardiovascular Disease

## 2018-02-04 VITALS — BP 118/64 | HR 77 | Ht 62.0 in | Wt 239.0 lb

## 2018-02-04 DIAGNOSIS — J9611 Chronic respiratory failure with hypoxia: Secondary | ICD-10-CM | POA: Diagnosis not present

## 2018-02-04 DIAGNOSIS — Z9889 Other specified postprocedural states: Secondary | ICD-10-CM | POA: Diagnosis not present

## 2018-02-04 DIAGNOSIS — I10 Essential (primary) hypertension: Secondary | ICD-10-CM

## 2018-02-04 NOTE — Patient Instructions (Signed)
Medication Instructions:  No changes  If you need a refill on your cardiac medications before your next appointment, please call your pharmacy.   Lab work: None ordered  Testing/Procedures: None ordered  Follow-Up: At [facility], you and your health needs are our priority.  As part of our continuing mission to provide you with exceptional heart care, we have created designated Provider Care Teams.  These Care Teams include your primary Cardiologist (physician) and Advanced Practice Providers (APPs -  Physician Assistants and Nurse Practitioners) who all work together to provide you with the care you need, when you need it. You will need a follow up appointment in 12 months.  Please call our office 2 months in advance to schedule this appointment.  You may see Dr. Arida or one of the following Advanced Practice Providers on your designated Care Team:   Christopher Berge, NP Ryan Dunn, PA-C . Jacquelyn Visser, PA-C    

## 2018-02-04 NOTE — Progress Notes (Signed)
Cardiology Office Note   Date:  02/04/2018   ID:  Lauren Bartlett, DOB March 31, 1956, MRN [id number]  PCP:  [name], MD  Cardiologist: Fletcher Anon  Chief Complaint  Patient presents with  . other    6 mo follow up. Medications reviewed verbally.       History of Present Illness: Lauren Bartlett is a 62 y.o. female who is here today for a follow-up visit regarding mitral regurgitation. The patient has chronic medical conditions that include COPD, mitral valve prolapse, essential hypertension, anxiety and depression. She is a lifelong nonsmoker but reports being exposed to smoking. She underwent successful mitral valve repair and PFO closure in April of 2018. She had postoperative atrial fibrillation that was treated with amiodarone. She was discharged home on warfarin which was subsequently discontinued after she developed epistaxis.  Most recent echocardiogram in 08/2016 showed normal LV systolic function, intact mitral valve repair, mild to moderate tricuspid regurgitation with moderate pulmonary hypertension. Systolic pulmonary pressure was 54 mmHg.  She had some depression after the death of her father earlier this year but her mood has improved significantly since then.  She is doing very well from a cardiac standpoint with no chest pain, orthopnea, PND or leg edema.  She reports stable dyspnea likely related to her underlying COPD.  Past Medical History:  Diagnosis Date  . Anxiety   . Bleeding from the nose   . Chronic diastolic congestive heart failure ([place])    a. 03/2016 Echo: EF 55-60%, Gr2 DD.  [name] Congenital absence of one kidney   . COPD (chronic obstructive pulmonary disease) ([place])   . Depression    treated at [facility]  . Dyspnea   . Emphysema/COPD ([place])   . GERD (gastroesophageal reflux disease)   . History of sciatica   . Hypertension   . Hypothyroidism   . Mitral Valve Prolapse & Severe mitral regurgitation s/p MVR    a. 03/2016 Echo: severe MVP involving the  posterior leaflet, sev MR;  b. 06/2016 minimally invasive MV repair w/ triangular resection of flail segment (P2) of posterior leaflet, gore-tex neochord placement x 4, Sorin Memo 3D rin Annulosplasty (20mm, catalog # [id number], ser # [id number]).  . Motion sickness    cars  . Pneumonia   . PONV (postoperative nausea and vomiting)   . Post-op Afib    a. 06/2016 following MV repair-->amio (dose reduced 2/2 QT prolongation), coumadin.  . Pulmonary hypertension ([place])   . S/P patent foramen ovale closure    a. 06/2016 @ time of MV Repair.  . Sleep apnea    O2 at night and PRN    Past Surgical History:  Procedure Laterality Date  . ABDOMINAL HYSTERECTOMY    . BREAST BIOPSY Right 2011   [facility]< benign  . CATARACT EXTRACTION W/PHACO Left 11/14/2015   Procedure: CATARACT EXTRACTION PHACO AND INTRAOCULAR LENS PLACEMENT (IOC);  Surgeon: Leandrew Koyanagi, MD;  Location: [facility];  Service: Ophthalmology;  Laterality: Left;  sleep apnea [place]  . CATARACT EXTRACTION W/PHACO Right 12/19/2015   Procedure: CATARACT EXTRACTION PHACO AND INTRAOCULAR LENS PLACEMENT (IOC);  Surgeon: Leandrew Koyanagi, MD;  Location: [facility];  Service: Ophthalmology;  Laterality: Right;  ANXIETY GENEROUS IV SEDATION TORIC LEN  . COMBINED HYSTERECTOMY ABDOMINAL W/ A&P REPAIR / OOPHORECTOMY  1996   benign tumor  . INNER EAR SURGERY     bilateral  . MITRAL VALVE REPAIR Right 07/17/2016   Procedure: MINIMALLY INVASIVE MITRAL VALVE REPAIR (MVR)  USING 26 SORIN MEMO 3D ANNULOPLASTY RING;  Surgeon: Rexene Alberts, MD;  Location: [facility];  Service: Open Heart Surgery;  Laterality: Right;  . PATENT FORAMEN OVALE(PFO) CLOSURE N/A 07/17/2016   Procedure: PATENT FORAMEN OVALE (PFO) CLOSURE;  Surgeon: Rexene Alberts, MD;  Location: [facility];  Service: Open Heart Surgery;  Laterality: N/A;  . RIGHT/LEFT HEART CATH AND CORONARY ANGIOGRAPHY Bilateral 06/16/2016   Procedure: Right/Left Heart Cath and Coronary Angiography;  Surgeon:  Wellington Hampshire, MD;  Location: [facility];  Service: Cardiovascular;  Laterality: Bilateral;  . TEE WITHOUT CARDIOVERSION N/A 05/19/2016   Procedure: TRANSESOPHAGEAL ECHOCARDIOGRAM (TEE);  Surgeon: Wellington Hampshire, MD;  Location: [facility];  Service: Cardiovascular;  Laterality: N/A;  . TEE WITHOUT CARDIOVERSION N/A 06/09/2016   Procedure: TRANSESOPHAGEAL ECHOCARDIOGRAM (TEE);  Surgeon: Wellington Hampshire, MD;  Location: [facility];  Service: Cardiovascular;  Laterality: N/A;  . TEE WITHOUT CARDIOVERSION N/A 06/16/2016   Procedure: TRANSESOPHAGEAL ECHOCARDIOGRAM (TEE);  Surgeon: Wellington Hampshire, MD;  Location: [facility];  Service: Cardiovascular;  Laterality: N/A;  . TEE WITHOUT CARDIOVERSION N/A 07/17/2016   Procedure: TRANSESOPHAGEAL ECHOCARDIOGRAM (TEE);  Surgeon: Rexene Alberts, MD;  Location: [facility];  Service: Open Heart Surgery;  Laterality: N/A;  . TONSILLECTOMY       Current Outpatient Medications  Medication Sig Dispense Refill  . acetaminophen (TYLENOL) 500 MG tablet Take 500 mg by mouth 2 (two) times daily as needed for moderate pain or headache.    . albuterol (PROVENTIL HFA;VENTOLIN HFA) 108 (90 Base) MCG/ACT inhaler INHALE 2 PUFFS BY MOUTH INTO THE LUNGS EVERY 6 HOURS AS NEEDED FOR WHEEZING 8.5 g 6  . albuterol (PROVENTIL) (2.5 MG/3ML) 0.083% nebulizer solution Take 2.5 mg by nebulization every 6 (six) hours as needed for wheezing or shortness of breath.    Jearl Klinefelter ELLIPTA 62.5-25 MCG/INH AEPB INHALE 1 PUFF INTO THE LUNGS EVERY DAY 180 each 11  . aspirin EC 81 MG EC tablet Take 1 tablet (81 mg total) by mouth daily.    . ergocalciferol (DRISDOL) 50000 units capsule Take 1 capsule (50,000 Units total) by mouth once a week. 4 capsule 11  . famotidine (PEPCID AC) 10 MG chewable tablet Chew 10 mg by mouth daily as needed for heartburn.    . ferrous sulfate 325 (65 FE) MG tablet Take 325 mg by mouth daily with breakfast.    . fluticasone (FLONASE) 50 MCG/ACT nasal spray USE 2  SPRAYS IN EACH NOSTRIL EVERY DAY 48 g 2  . furosemide (LASIX) 20 MG tablet TAKE 1 TABLET EVERY DAY 90 tablet 1  . Hypromellose (ISOPTO TEARS) 0.5 % SOLN Apply 1 drop to eye 2 (two) times daily.    [name] levothyroxine (SYNTHROID, LEVOTHROID) 125 MCG tablet TAKE 1 TABLET EVERY DAY BEFORE BREAKFAST 90 tablet 1  . loratadine-pseudoephedrine (CLARITIN-D 24-HOUR) 10-240 MG 24 hr tablet Take 1 tablet by mouth daily.    [name] losartan (COZAAR) 50 MG tablet TAKE 1 TABLET EVERY DAY 90 tablet 1  . OXYGEN Inhale 2 L into the lungs at bedtime as needed (at bedtime and as needed).    [name] oxymetazoline (AFRIN) 0.05 % nasal spray Place 1 spray into both nostrils 2 (two) times daily. As needed for nose bleed    . polyethylene glycol powder (GLYCOLAX/MIRALAX) powder Take 17 g by mouth 2 (two) times daily as needed. (Patient taking differently: Take 17 g by mouth daily as needed for mild constipation. ) 3350 g 1  .  traMADol (ULTRAM) 50 MG tablet Take 1-2 tablets (50-100 mg total) by mouth every 4 (four) hours as needed for moderate pain. 30 tablet 0  . trimethoprim (TRIMPEX) 100 MG tablet Take 1 tablet (100 mg total) by mouth 2 (two) times daily. 20 tablet 0  . vitamin B-12 (CYANOCOBALAMIN) 1000 MCG tablet Take 1 tablet (1,000 mcg total) by mouth daily. 90 tablet 3   No current facility-administered medications for this visit.     Allergies:   Codeine; Levaquin [levofloxacin]; Penicillins; Zithromax [azithromycin]; and Prednisone    Social History:  The patient  reports that she has never smoked. She has never used smokeless tobacco. She reports that she does not drink alcohol or use drugs.   Family History:  The patient's family history includes Breast cancer in her paternal aunt; Cancer in her brother; Coronary artery disease in her father; Heart disease in her brother and father; Hypertension in her father and mother; Multiple sclerosis in her mother.    ROS:  Please see the history of present illness.   Otherwise,  review of systems are positive for none.   All other systems are reviewed and negative.    PHYSICAL EXAM: VS:  BP 118/64 (BP Location: Right Arm, Patient Position: Sitting, Cuff Size: Normal)   Ht 5\' 2"  (1.575 m)   Wt 239 lb (108.4 kg)   BMI 43.71 kg/m  , BMI Body mass index is 43.71 kg/m. GEN: Well nourished, well developed, in no acute distress  HEENT: normal  Neck: no JVD, carotid bruits, or masses Cardiac: RRR; no rubs, or gallops,no edema . No heart murmurs.  Respiratory: Clear to auscultation bilaterally. GI: soft, nontender, nondistended, + BS MS: no deformity or atrophy  Skin: warm and dry, no rash Neuro:  Strength and sensation are intact Psych: euthymic mood, full affect    EKG:  EKG is ordered today. EKG showed normal sinus rhythm .  Possible left atrial enlargement.  Recent Labs: 02/03/2018: ALT 14; BUN 15; Creatinine, Ser 0.79; Potassium 4.3; Sodium 141; TSH 2.42    Lipid Panel    Component Value Date/Time   CHOL 173 03/14/2015 1048   TRIG 85.0 03/14/2015 1048   HDL 57.10 03/14/2015 1048   CHOLHDL 3 03/14/2015 1048   VLDL 17.0 03/14/2015 1048   LDLCALC 99 03/14/2015 1048   LDLDIRECT 101.0 07/30/2017 1007      Wt Readings from Last 3 Encounters:  02/04/18 239 lb (108.4 kg)  02/03/18 240 lb (108.9 kg)  11/26/17 233 lb (105.7 kg)      PAD Screen 05/13/2016  Previous PAD dx? No  Previous surgical procedure? No  Pain with walking? No  Feet/toe relief with dangling? No  Painful, non-healing ulcers? No  Extremities discolored? No      ASSESSMENT AND PLAN:  1.  Status post successful mitral valve repair and PFO closure for severe mitral regurgitation due to mitral valve prolapse:   She is doing extremely well from a cardiac standpoint.  2. Essential hypertension: Blood pressure is controlled.   3.  COPD: Seems to be stable overall.  She is followed by Dr. Alva Garnet.  Disposition:   FU with me in 12 months  Signed,  Kathlyn Sacramento, MD    02/04/2018 9:31 AM    [place]

## 2018-02-05 DIAGNOSIS — J9611 Chronic respiratory failure with hypoxia: Secondary | ICD-10-CM | POA: Diagnosis not present

## 2018-02-05 DIAGNOSIS — J449 Chronic obstructive pulmonary disease, unspecified: Secondary | ICD-10-CM | POA: Diagnosis not present

## 2018-02-06 DIAGNOSIS — G4736 Sleep related hypoventilation in conditions classified elsewhere: Secondary | ICD-10-CM | POA: Insufficient documentation

## 2018-02-06 DIAGNOSIS — J449 Chronic obstructive pulmonary disease, unspecified: Secondary | ICD-10-CM

## 2018-02-06 DIAGNOSIS — J9611 Chronic respiratory failure with hypoxia: Secondary | ICD-10-CM | POA: Diagnosis not present

## 2018-02-06 NOTE — Assessment & Plan Note (Signed)
I have addressed  BMI and recommended wt loss of 10% of body weigh over the next 6 months using a low glycemic index diet and regular exercise a minimum of 5 days per week.   

## 2018-02-06 NOTE — Assessment & Plan Note (Signed)
Due to loss of father in October of 2018. [name] Patient has adequate coping skills and emotional support .

## 2018-02-06 NOTE — Assessment & Plan Note (Signed)
Managed with prn use of  lasix and losartan daily  Lab Results  Component Value Date   CREATININE 0.79 02/03/2018

## 2018-02-06 NOTE — Assessment & Plan Note (Signed)
Well controlled on current regimen. Renal function stable, no changes today.  Lab Results  Component Value Date   CREATININE 0.79 02/03/2018   Lab Results  Component Value Date   NA 141 02/03/2018   K 4.3 02/03/2018   CL 103 02/03/2018   CO2 31 02/03/2018

## 2018-02-06 NOTE — Assessment & Plan Note (Signed)
Thyroid function is maximally active on current dose.  No current changes needed.   Lab Results  Component Value Date   TSH 2.42 02/03/2018

## 2018-02-06 NOTE — Assessment & Plan Note (Signed)
Continue nocturnal use of supplemental oxygen at 2 L/min 

## 2018-02-06 NOTE — Assessment & Plan Note (Signed)
She has been having occasional chest tightness in the morning.  Exam is normal today , continue use of albuterol prn and Anoro daily

## 2018-02-06 NOTE — Assessment & Plan Note (Addendum)
Incidental finding  Of solitary right kidney,  Patent renal artery during workup in 2018 for respiratory failure and mitral valve regurgitation

## 2018-02-08 ENCOUNTER — Encounter: Payer: Self-pay | Admitting: *Deleted

## 2018-02-08 DIAGNOSIS — J9611 Chronic respiratory failure with hypoxia: Secondary | ICD-10-CM | POA: Diagnosis not present

## 2018-02-09 DIAGNOSIS — J9611 Chronic respiratory failure with hypoxia: Secondary | ICD-10-CM | POA: Diagnosis not present

## 2018-02-10 DIAGNOSIS — J9611 Chronic respiratory failure with hypoxia: Secondary | ICD-10-CM | POA: Diagnosis not present

## 2018-02-11 ENCOUNTER — Ambulatory Visit (INDEPENDENT_AMBULATORY_CARE_PROVIDER_SITE_OTHER): Payer: Medicare HMO

## 2018-02-11 VITALS — BP 118/70 | HR 81 | Temp 97.6°F | Resp 18 | Ht 61.75 in | Wt 239.1 lb

## 2018-02-11 DIAGNOSIS — Z0000 Encounter for general adult medical examination without abnormal findings: Secondary | ICD-10-CM

## 2018-02-11 DIAGNOSIS — J9611 Chronic respiratory failure with hypoxia: Secondary | ICD-10-CM | POA: Diagnosis not present

## 2018-02-11 NOTE — Progress Notes (Addendum)
Subjective:   Lauren Bartlett is a 62 y.o. female who presents for Medicare Annual (Subsequent) preventive examination.  Review of Systems:  No ROS.  Medicare Wellness Visit. Additional risk factors are reflected in the social history. Cardiac Risk Factors include: hypertension;obesity (BMI >30kg/m2)     Objective:     Vitals: BP 118/70 (BP Location: Left Arm, Patient Position: Sitting, Cuff Size: Normal)   Pulse 81   Temp 97.6 F (36.4 C) (Oral)   Resp 18   Ht 5' 1.75" (1.568 m)   Wt 239 lb 1.9 oz (108.5 kg)   SpO2 97%   BMI 44.09 kg/m   Body mass index is 44.09 kg/m.  Advanced Directives 02/11/2018 02/10/2017 01/01/2017 10/20/2016 10/10/2016 09/07/2016 08/22/2016  Does Patient Have a Medical Advance Directive? No No No No No No No  Would patient like information on creating a medical advance directive? No - Patient declined No - Patient declined - - No - Patient declined - No - Patient declined    Tobacco Social History   Tobacco Use  Smoking Status Never Smoker  Smokeless Tobacco Never Used     Counseling given: Not Answered   Clinical Intake:  Pre-visit preparation completed: Yes  Pain : No/denies pain     Nutritional Status: BMI > 30  Obese Diabetes: No  How often do you need to have someone help you when you read instructions, pamphlets, or other written materials from your doctor or pharmacy?: 3 - Sometimes  Interpreter Needed?: No     Past Medical History:  Diagnosis Date  . Anxiety   . Bleeding from the nose   . Chronic diastolic congestive heart failure ([place])    a. 03/2016 Echo: EF 55-60%, Gr2 DD.  [name] Congenital absence of one kidney   . COPD (chronic obstructive pulmonary disease) ([place])   . Depression    treated at [facility]  . Dyspnea   . Emphysema/COPD ([place])   . GERD (gastroesophageal reflux disease)   . History of sciatica   . Hypertension   . Hypothyroidism   . Mitral Valve Prolapse & Severe mitral regurgitation s/p MVR    a.  03/2016 Echo: severe MVP involving the posterior leaflet, sev MR;  b. 06/2016 minimally invasive MV repair w/ triangular resection of flail segment (P2) of posterior leaflet, gore-tex neochord placement x 4, Sorin Memo 3D rin Annulosplasty (32mm, catalog # [id number], ser # [id number]).  . Motion sickness    cars  . Pneumonia   . PONV (postoperative nausea and vomiting)   . Post-op Afib    a. 06/2016 following MV repair-->amio (dose reduced 2/2 QT prolongation), coumadin.  . Pulmonary hypertension ([place])   . S/P patent foramen ovale closure    a. 06/2016 @ time of MV Repair.  . Sleep apnea    O2 at night and PRN   Past Surgical History:  Procedure Laterality Date  . ABDOMINAL HYSTERECTOMY    . BREAST BIOPSY Right 2011   [facility]< benign  . CATARACT EXTRACTION W/PHACO Left 11/14/2015   Procedure: CATARACT EXTRACTION PHACO AND INTRAOCULAR LENS PLACEMENT (IOC);  Surgeon: Leandrew Koyanagi, MD;  Location: [facility];  Service: Ophthalmology;  Laterality: Left;  sleep apnea [place]  . CATARACT EXTRACTION W/PHACO Right 12/19/2015   Procedure: CATARACT EXTRACTION PHACO AND INTRAOCULAR LENS PLACEMENT (IOC);  Surgeon: Leandrew Koyanagi, MD;  Location: [facility];  Service: Ophthalmology;  Laterality: Right;  ANXIETY GENEROUS IV SEDATION TORIC LEN  . COMBINED HYSTERECTOMY ABDOMINAL W/ A&P  REPAIR / OOPHORECTOMY  1996   benign tumor  . INNER EAR SURGERY     bilateral  . MITRAL VALVE REPAIR Right 07/17/2016   Procedure: MINIMALLY INVASIVE MITRAL VALVE REPAIR (MVR) USING 26 SORIN MEMO 3D ANNULOPLASTY RING;  Surgeon: Rexene Alberts, MD;  Location: [facility];  Service: Open Heart Surgery;  Laterality: Right;  . PATENT FORAMEN OVALE(PFO) CLOSURE N/A 07/17/2016   Procedure: PATENT FORAMEN OVALE (PFO) CLOSURE;  Surgeon: Rexene Alberts, MD;  Location: [facility];  Service: Open Heart Surgery;  Laterality: N/A;  . RIGHT/LEFT HEART CATH AND CORONARY ANGIOGRAPHY Bilateral 06/16/2016   Procedure: Right/Left Heart Cath  and Coronary Angiography;  Surgeon: Wellington Hampshire, MD;  Location: [facility];  Service: Cardiovascular;  Laterality: Bilateral;  . TEE WITHOUT CARDIOVERSION N/A 05/19/2016   Procedure: TRANSESOPHAGEAL ECHOCARDIOGRAM (TEE);  Surgeon: Wellington Hampshire, MD;  Location: [facility];  Service: Cardiovascular;  Laterality: N/A;  . TEE WITHOUT CARDIOVERSION N/A 06/09/2016   Procedure: TRANSESOPHAGEAL ECHOCARDIOGRAM (TEE);  Surgeon: Wellington Hampshire, MD;  Location: [facility];  Service: Cardiovascular;  Laterality: N/A;  . TEE WITHOUT CARDIOVERSION N/A 06/16/2016   Procedure: TRANSESOPHAGEAL ECHOCARDIOGRAM (TEE);  Surgeon: Wellington Hampshire, MD;  Location: [facility];  Service: Cardiovascular;  Laterality: N/A;  . TEE WITHOUT CARDIOVERSION N/A 07/17/2016   Procedure: TRANSESOPHAGEAL ECHOCARDIOGRAM (TEE);  Surgeon: Rexene Alberts, MD;  Location: [facility];  Service: Open Heart Surgery;  Laterality: N/A;  . TONSILLECTOMY     Family History  Problem Relation Age of Onset  . Multiple sclerosis Mother   . Hypertension Mother   . Coronary artery disease Father   . Heart disease Father   . Hypertension Father   . Heart disease Brother   . Cancer Brother   . Breast cancer Paternal Aunt    Social History   Socioeconomic History  . Marital status: Single    Spouse name: Not on file  . Number of children: Not on file  . Years of education: Not on file  . Highest education level: Not on file  Occupational History  . Occupation: cna    Comment: part time  Social Needs  . Financial resource strain: Not very hard  . Food insecurity:    Worry: Never true    Inability: Never true  . Transportation needs:    Medical: No    Non-medical: No  Tobacco Use  . Smoking status: Never Smoker  . Smokeless tobacco: Never Used  Substance and Sexual Activity  . Alcohol use: No  . Drug use: No  . Sexual activity: Not Currently  Lifestyle  . Physical activity:    Days per week: 7 days    Minutes per session:  30 min  . Stress: Only a little  Relationships  . Social connections:    Talks on phone: Not on file    Gets together: Not on file    Attends religious service: Not on file    Active member of club or organization: Not on file    Attends meetings of clubs or organizations: Not on file    Relationship status: Not on file  Other Topics Concern  . Not on file  Social History Narrative   Lives in a group home for adults          Outpatient Encounter Medications as of 02/11/2018  Medication Sig  . acetaminophen (TYLENOL) 500 MG tablet Take 500 mg by mouth 2 (two) times daily as needed for moderate  pain or headache.  . albuterol (PROVENTIL HFA;VENTOLIN HFA) 108 (90 Base) MCG/ACT inhaler INHALE 2 PUFFS BY MOUTH INTO THE LUNGS EVERY 6 HOURS AS NEEDED FOR WHEEZING  . albuterol (PROVENTIL) (2.5 MG/3ML) 0.083% nebulizer solution Take 2.5 mg by nebulization every 6 (six) hours as needed for wheezing or shortness of breath.  Jearl Klinefelter ELLIPTA 62.5-25 MCG/INH AEPB INHALE 1 PUFF INTO THE LUNGS EVERY DAY  . aspirin EC 81 MG EC tablet Take 1 tablet (81 mg total) by mouth daily.  . ergocalciferol (DRISDOL) 50000 units capsule Take 1 capsule (50,000 Units total) by mouth once a week.  . famotidine (PEPCID AC) 10 MG chewable tablet Chew 10 mg by mouth daily as needed for heartburn.  . ferrous sulfate 325 (65 FE) MG tablet Take 325 mg by mouth daily with breakfast.  . fluticasone (FLONASE) 50 MCG/ACT nasal spray USE 2 SPRAYS IN EACH NOSTRIL EVERY DAY  . furosemide (LASIX) 20 MG tablet TAKE 1 TABLET EVERY DAY  . Hypromellose (ISOPTO TEARS) 0.5 % SOLN Apply 1 drop to eye 2 (two) times daily.  [name] levothyroxine (SYNTHROID, LEVOTHROID) 125 MCG tablet TAKE 1 TABLET EVERY DAY BEFORE BREAKFAST  . loratadine-pseudoephedrine (CLARITIN-D 24-HOUR) 10-240 MG 24 hr tablet Take 1 tablet by mouth daily.  [name] losartan (COZAAR) 50 MG tablet TAKE 1 TABLET EVERY DAY  . OXYGEN Inhale 2 L into the lungs at bedtime as needed (at  bedtime and as needed).  [name] oxymetazoline (AFRIN) 0.05 % nasal spray Place 1 spray into both nostrils 2 (two) times daily. As needed for nose bleed  . polyethylene glycol powder (GLYCOLAX/MIRALAX) powder Take 17 g by mouth 2 (two) times daily as needed. (Patient taking differently: Take 17 g by mouth daily as needed for mild constipation. )  . traMADol (ULTRAM) 50 MG tablet Take 1-2 tablets (50-100 mg total) by mouth every 4 (four) hours as needed for moderate pain.  [name] trimethoprim (TRIMPEX) 100 MG tablet Take 1 tablet (100 mg total) by mouth 2 (two) times daily.  . vitamin B-12 (CYANOCOBALAMIN) 1000 MCG tablet Take 1 tablet (1,000 mcg total) by mouth daily.   No facility-administered encounter medications on file as of 02/11/2018.     Activities of Daily Living In your present state of health, do you have any difficulty performing the following activities: 02/11/2018  Hearing? Y  Comment Hearing aid, bilateral  Vision? N  Difficulty concentrating or making decisions? N  Walking or climbing stairs? Y  Comment Unsteady gait  Dressing or bathing? Y  Comment Staff assists  Doing errands, shopping? N  Preparing Food and eating ? Y  Comment She does not cook. Self feeds.   Using the Toilet? N  In the past six months, have you accidently leaked urine? N  Do you have problems with loss of bowel control? N  Managing your Medications? N  Managing your Finances? N  Housekeeping or managing your Housekeeping? Y  Comment Staff assists  Some recent data might be hidden    Patient Care Team: [name], MD as PCP - General (Internal Medicine)    Assessment:   This is a routine wellness examination for Paitlyn.  The goal of the wellness visit is to assist the patient how to close the gaps in care and create a preventative care plan for the patient.   The roster of all physicians providing medical care to patient is listed in the Snapshot section of the chart.  Taking calcium VIT D as  appropriate/Osteoporosis  risk reviewed.    Safety issues reviewed; Lives in group home. Smoke and carbon monoxide detectors in the home. No firearms in the home. Wears seatbelts when driving or riding with others. No violence in the home.  They do not have excessive sun exposure.  Discussed the need for sun protection: hats, long sleeves and the use of sunscreen if there is significant sun exposure.  Patient is alert, normal appearance, oriented to person/place/and time. Correctly identified the president of the [place] and recalls of 3/3 words. Performs simple calculations and can read correct time from watch face. Displays appropriate judgement.  Staff aides assist as needed with meal prep, bathing, dressing and housekeeping. . Ambulates with cane.  BMI- discussed the importance of a healthy diet, water intake and the benefits of aerobic exercise. Educational material provided.   24 hour diet recall: Regular diet  Dental- every 6 months.  Sleep patterns- Sleeps fair. Oxygen 2L worn at night.   Health maintenance gaps- closed.  Patient Concerns: None at this time. Follow up with PCP as needed.  Exercise Activities and Dietary recommendations Current Exercise Habits: Home exercise routine, Type of exercise: walking, Time (Minutes): 30, Frequency (Times/Week): 7, Weekly Exercise (Minutes/Week): 210, Intensity: Mild  Goals      Patient Stated   . Increase physical activity (pt-stated)     Walk more for exercise. Lose weight.        Fall Risk Fall Risk  02/11/2018 02/10/2017 11/06/2016 09/04/2016 08/22/2016  Falls in the past year? 0 No No (No Data) No  Comment - - - No new falls reported today -    Depression Screen PHQ 2/9 Scores 02/11/2018 02/10/2017 11/06/2016 08/22/2016  PHQ - 2 Score 0 1 0 0  PHQ- 9 Score - 3 - -     Cognitive Function MMSE - Mini Mental State Exam 02/10/2017 02/11/2016  Orientation to time 5 5  Orientation to Place 5 5  Registration 3 3  Attention/  Calculation 5 5  Recall 3 3  Language- name 2 objects 2 2  Language- repeat 1 1  Language- follow 3 step command 3 3  Language- read & follow direction 1 1  Write a sentence 1 1  Copy design 1 1  Total score 30 30     6CIT Screen 02/11/2018 02/11/2016  What Year? 0 points 0 points  What month? 0 points 0 points  What time? 0 points 0 points  Count back from 20 0 points 0 points  Months in reverse 0 points 0 points    Immunization History  Administered Date(s) Administered  . Influenza Split 01/02/2012  . Influenza,inj,Quad PF,6+ Mos 03/13/2014, 01/08/2015, 02/03/2018  . Influenza-Unspecified 01/11/2013, 12/30/2014  . Pneumococcal Conjugate-13 02/27/2015  . Pneumococcal Polysaccharide-23 03/07/2013  . Tdap 11/12/2009   Screening Tests Health Maintenance  Topic Date Due  . MAMMOGRAM  11/06/2019  . TETANUS/TDAP  11/13/2019  . Fecal DNA (Cologuard)  11/18/2019  . INFLUENZA VACCINE  Completed  . Hepatitis C Screening  Completed  . HIV Screening  Completed  . PAP SMEAR  Discontinued      Plan:    End of life planning; Advance aging; Advanced directives discussed. Copy of current HCPOA/Living Will requested upon completion.    I have personally reviewed and noted the following in the patient's chart:   . Medical and social history . Use of alcohol, tobacco or illicit drugs  . Current medications and supplements . Functional ability and status . Nutritional status . Physical  activity . Advanced directives . List of other physicians . Hospitalizations, surgeries, and ER visits in previous 12 months . Vitals . Screenings to include cognitive, depression, and falls . Referrals and appointments  In addition, I have reviewed and discussed with patient certain preventive protocols, quality metrics, and best practice recommendations. A written personalized care plan for preventive services as well as general preventive health recommendations were provided to patient.      OBrien-Blaney, Anwen Cannedy L, LPN  75/12/2583    I have reviewed the above information and agree with above.   Deborra Medina, MD

## 2018-02-11 NOTE — Patient Instructions (Addendum)
  Lauren Bartlett , Thank you for taking time to come for your Medicare Wellness Visit. I appreciate your ongoing commitment to your health goals. Please review the following plan we discussed and let me know if I can assist you in the future.   These are the goals we discussed: Goals      Patient Stated   . Increase physical activity (pt-stated)     Walk more for exercise. Lose weight.        This is a list of the screening recommended for you and due dates:  Health Maintenance  Topic Date Due  . Mammogram  11/06/2019  . Tetanus Vaccine  11/13/2019  . Cologuard (Stool DNA test)  11/18/2019  . Flu Shot  Completed  .  Hepatitis C: One time screening is recommended by Center for Disease Control  (CDC) for  adults born from [birth year] through 1965.   Completed  . HIV Screening  Completed  . Pap Smear  Discontinued

## 2018-02-12 DIAGNOSIS — J9611 Chronic respiratory failure with hypoxia: Secondary | ICD-10-CM | POA: Diagnosis not present

## 2018-02-13 DIAGNOSIS — J9611 Chronic respiratory failure with hypoxia: Secondary | ICD-10-CM | POA: Diagnosis not present

## 2018-02-15 DIAGNOSIS — J9611 Chronic respiratory failure with hypoxia: Secondary | ICD-10-CM | POA: Diagnosis not present

## 2018-02-16 DIAGNOSIS — J9611 Chronic respiratory failure with hypoxia: Secondary | ICD-10-CM | POA: Diagnosis not present

## 2018-02-17 DIAGNOSIS — J9611 Chronic respiratory failure with hypoxia: Secondary | ICD-10-CM | POA: Diagnosis not present

## 2018-02-18 DIAGNOSIS — J9611 Chronic respiratory failure with hypoxia: Secondary | ICD-10-CM | POA: Diagnosis not present

## 2018-02-19 DIAGNOSIS — J9611 Chronic respiratory failure with hypoxia: Secondary | ICD-10-CM | POA: Diagnosis not present

## 2018-02-20 DIAGNOSIS — J9611 Chronic respiratory failure with hypoxia: Secondary | ICD-10-CM | POA: Diagnosis not present

## 2018-02-22 DIAGNOSIS — J9611 Chronic respiratory failure with hypoxia: Secondary | ICD-10-CM | POA: Diagnosis not present

## 2018-02-23 DIAGNOSIS — J9611 Chronic respiratory failure with hypoxia: Secondary | ICD-10-CM | POA: Diagnosis not present

## 2018-02-24 DIAGNOSIS — J9611 Chronic respiratory failure with hypoxia: Secondary | ICD-10-CM | POA: Diagnosis not present

## 2018-02-25 DIAGNOSIS — J9611 Chronic respiratory failure with hypoxia: Secondary | ICD-10-CM | POA: Diagnosis not present

## 2018-02-26 DIAGNOSIS — J9611 Chronic respiratory failure with hypoxia: Secondary | ICD-10-CM | POA: Diagnosis not present

## 2018-02-27 DIAGNOSIS — J9611 Chronic respiratory failure with hypoxia: Secondary | ICD-10-CM | POA: Diagnosis not present

## 2018-03-07 DIAGNOSIS — J449 Chronic obstructive pulmonary disease, unspecified: Secondary | ICD-10-CM | POA: Diagnosis not present

## 2018-03-08 DIAGNOSIS — J9611 Chronic respiratory failure with hypoxia: Secondary | ICD-10-CM | POA: Diagnosis not present

## 2018-03-09 DIAGNOSIS — J9611 Chronic respiratory failure with hypoxia: Secondary | ICD-10-CM | POA: Diagnosis not present

## 2018-03-10 DIAGNOSIS — J9611 Chronic respiratory failure with hypoxia: Secondary | ICD-10-CM | POA: Diagnosis not present

## 2018-03-11 DIAGNOSIS — J9611 Chronic respiratory failure with hypoxia: Secondary | ICD-10-CM | POA: Diagnosis not present

## 2018-03-12 DIAGNOSIS — J9611 Chronic respiratory failure with hypoxia: Secondary | ICD-10-CM | POA: Diagnosis not present

## 2018-03-13 DIAGNOSIS — J9611 Chronic respiratory failure with hypoxia: Secondary | ICD-10-CM | POA: Diagnosis not present

## 2018-03-15 DIAGNOSIS — J9611 Chronic respiratory failure with hypoxia: Secondary | ICD-10-CM | POA: Diagnosis not present

## 2018-03-16 DIAGNOSIS — J9611 Chronic respiratory failure with hypoxia: Secondary | ICD-10-CM | POA: Diagnosis not present

## 2018-03-17 DIAGNOSIS — J9611 Chronic respiratory failure with hypoxia: Secondary | ICD-10-CM | POA: Diagnosis not present

## 2018-03-18 DIAGNOSIS — J9611 Chronic respiratory failure with hypoxia: Secondary | ICD-10-CM | POA: Diagnosis not present

## 2018-03-19 DIAGNOSIS — J9611 Chronic respiratory failure with hypoxia: Secondary | ICD-10-CM | POA: Diagnosis not present

## 2018-03-20 DIAGNOSIS — J9611 Chronic respiratory failure with hypoxia: Secondary | ICD-10-CM | POA: Diagnosis not present

## 2018-03-22 DIAGNOSIS — J9611 Chronic respiratory failure with hypoxia: Secondary | ICD-10-CM | POA: Diagnosis not present

## 2018-03-23 DIAGNOSIS — J9611 Chronic respiratory failure with hypoxia: Secondary | ICD-10-CM | POA: Diagnosis not present

## 2018-03-24 DIAGNOSIS — J9611 Chronic respiratory failure with hypoxia: Secondary | ICD-10-CM | POA: Diagnosis not present

## 2018-03-25 DIAGNOSIS — J9611 Chronic respiratory failure with hypoxia: Secondary | ICD-10-CM | POA: Diagnosis not present

## 2018-03-26 DIAGNOSIS — J9611 Chronic respiratory failure with hypoxia: Secondary | ICD-10-CM | POA: Diagnosis not present

## 2018-03-27 DIAGNOSIS — J9611 Chronic respiratory failure with hypoxia: Secondary | ICD-10-CM | POA: Diagnosis not present

## 2018-03-29 DIAGNOSIS — J9611 Chronic respiratory failure with hypoxia: Secondary | ICD-10-CM | POA: Diagnosis not present

## 2018-03-30 DIAGNOSIS — J9611 Chronic respiratory failure with hypoxia: Secondary | ICD-10-CM | POA: Diagnosis not present

## 2018-03-31 DIAGNOSIS — J9611 Chronic respiratory failure with hypoxia: Secondary | ICD-10-CM | POA: Diagnosis not present

## 2018-04-01 DIAGNOSIS — J9611 Chronic respiratory failure with hypoxia: Secondary | ICD-10-CM | POA: Diagnosis not present

## 2018-04-02 DIAGNOSIS — J9611 Chronic respiratory failure with hypoxia: Secondary | ICD-10-CM | POA: Diagnosis not present

## 2018-04-03 DIAGNOSIS — J9611 Chronic respiratory failure with hypoxia: Secondary | ICD-10-CM | POA: Diagnosis not present

## 2018-04-05 DIAGNOSIS — J9611 Chronic respiratory failure with hypoxia: Secondary | ICD-10-CM | POA: Diagnosis not present

## 2018-04-06 DIAGNOSIS — J9611 Chronic respiratory failure with hypoxia: Secondary | ICD-10-CM | POA: Diagnosis not present

## 2018-04-07 DIAGNOSIS — J9611 Chronic respiratory failure with hypoxia: Secondary | ICD-10-CM | POA: Diagnosis not present

## 2018-04-08 DIAGNOSIS — J9611 Chronic respiratory failure with hypoxia: Secondary | ICD-10-CM | POA: Diagnosis not present

## 2018-04-09 DIAGNOSIS — J9611 Chronic respiratory failure with hypoxia: Secondary | ICD-10-CM | POA: Diagnosis not present

## 2018-04-10 DIAGNOSIS — J9611 Chronic respiratory failure with hypoxia: Secondary | ICD-10-CM | POA: Diagnosis not present

## 2018-04-12 DIAGNOSIS — J9611 Chronic respiratory failure with hypoxia: Secondary | ICD-10-CM | POA: Diagnosis not present

## 2018-04-13 DIAGNOSIS — J9611 Chronic respiratory failure with hypoxia: Secondary | ICD-10-CM | POA: Diagnosis not present

## 2018-04-14 DIAGNOSIS — J9611 Chronic respiratory failure with hypoxia: Secondary | ICD-10-CM | POA: Diagnosis not present

## 2018-04-15 DIAGNOSIS — J9611 Chronic respiratory failure with hypoxia: Secondary | ICD-10-CM | POA: Diagnosis not present

## 2018-04-16 DIAGNOSIS — J9611 Chronic respiratory failure with hypoxia: Secondary | ICD-10-CM | POA: Diagnosis not present

## 2018-04-17 DIAGNOSIS — J9611 Chronic respiratory failure with hypoxia: Secondary | ICD-10-CM | POA: Diagnosis not present

## 2018-04-26 DIAGNOSIS — J9611 Chronic respiratory failure with hypoxia: Secondary | ICD-10-CM | POA: Diagnosis not present

## 2018-04-27 DIAGNOSIS — J9611 Chronic respiratory failure with hypoxia: Secondary | ICD-10-CM | POA: Diagnosis not present

## 2018-04-28 ENCOUNTER — Ambulatory Visit
Admission: EM | Admit: 2018-04-28 | Discharge: 2018-04-28 | Disposition: A | Discharge status: Home / Self Care | Payer: Medicare HMO | Attending: Family Medicine | Admitting: Family Medicine

## 2018-04-28 ENCOUNTER — Encounter: Payer: Self-pay | Admitting: Emergency Medicine

## 2018-04-28 ENCOUNTER — Other Ambulatory Visit: Payer: Self-pay

## 2018-04-28 DIAGNOSIS — J441 Chronic obstructive pulmonary disease with (acute) exacerbation: Secondary | ICD-10-CM | POA: Diagnosis not present

## 2018-04-28 DIAGNOSIS — J9611 Chronic respiratory failure with hypoxia: Secondary | ICD-10-CM | POA: Diagnosis not present

## 2018-04-28 MED ORDER — METHYLPREDNISOLONE SODIUM SUCC 125 MG IJ SOLR
125.0000 mg | Freq: Once | INTRAMUSCULAR | Status: AC
  Administered 2018-04-28: 125 mg via INTRAMUSCULAR

## 2018-04-28 NOTE — Discharge Instructions (Signed)
Continue current home medications  

## 2018-04-28 NOTE — ED Provider Notes (Signed)
[facility]    CSN: [id number] Arrival date & time: 04/28/18  1229     History   Chief Complaint Chief Complaint  Patient presents with  . Cough  . Wheezing    HPI Lauren Bartlett is a 63 y.o. female.   The history is provided by the patient.  URI  Presenting symptoms: cough   Severity:  Moderate Onset quality:  Sudden Duration:  2 days Timing:  Constant Progression:  Worsening Chronicity:  New Relieved by:  Nothing Ineffective treatments:  Prescription medications and OTC medications Associated symptoms: wheezing   Risk factors: chronic respiratory disease (copd) and sick contacts     Past Medical History:  Diagnosis Date  . Anxiety   . Bleeding from the nose   . Chronic diastolic congestive heart failure ([place])    a. 03/2016 Echo: EF 55-60%, Gr2 DD.  [name] Congenital absence of one kidney   . COPD (chronic obstructive pulmonary disease) ([place])   . Depression    treated at [facility]  . Dyspnea   . Emphysema/COPD ([place])   . GERD (gastroesophageal reflux disease)   . History of sciatica   . Hypertension   . Hypothyroidism   . Mitral Valve Prolapse & Severe mitral regurgitation s/p MVR    a. 03/2016 Echo: severe MVP involving the posterior leaflet, sev MR;  b. 06/2016 minimally invasive MV repair w/ triangular resection of flail segment (P2) of posterior leaflet, gore-tex neochord placement x 4, Sorin Memo 3D rin Annulosplasty (16mm, catalog # [id number], ser # [id number]).  . Motion sickness    cars  . Pneumonia   . PONV (postoperative nausea and vomiting)   . Post-op Afib    a. 06/2016 following MV repair-->amio (dose reduced 2/2 QT prolongation), coumadin.  . Pulmonary hypertension ([place])   . S/P patent foramen ovale closure    a. 06/2016 @ time of MV Repair.  . Sleep apnea    O2 at night and PRN    Patient Active Problem List   Diagnosis Date Noted  . Nocturnal hypoxemia due to obstructive chronic bronchitis ([place]) 02/06/2018  . Grief reaction with  prolonged bereavement 08/01/2017  . Encounter for therapeutic drug monitoring 08/13/2016  . S/P minimally invasive mitral valve repair 07/17/2016  . S/P patent foramen ovale closure 07/17/2016  . Chronic diastolic congestive heart failure ([place])   . COPD (chronic obstructive pulmonary disease) ([place]) 05/19/2016  . 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  . Vitamin D deficiency 01/11/2015  . Allergic rhinitis 07/09/2014  . S/P hysterectomy with oophorectomy 09/22/2013  . Pulmonary hypertension ([place])   . Hypocalcemia 09/10/2012  . Hypothyroidism 05/04/2012  . Obesity (BMI 30-39.9) 08/04/2011  . Hypertension   . Congenital absence of one kidney   . History of sciatica     Past Surgical History:  Procedure Laterality Date  . ABDOMINAL HYSTERECTOMY    . BREAST BIOPSY Right 2011   [facility]< benign  . CATARACT EXTRACTION W/PHACO Left 11/14/2015   Procedure: CATARACT EXTRACTION PHACO AND INTRAOCULAR LENS PLACEMENT (IOC);  Surgeon: Leandrew Koyanagi, MD;  Location: [facility];  Service: Ophthalmology;  Laterality: Left;  sleep apnea Toric  . CATARACT EXTRACTION W/PHACO Right 12/19/2015   Procedure: CATARACT EXTRACTION PHACO AND INTRAOCULAR LENS PLACEMENT (IOC);  Surgeon: Leandrew Koyanagi, MD;  Location: [facility];  Service: Ophthalmology;  Laterality: Right;  ANXIETY GENEROUS IV SEDATION TORIC LEN  . COMBINED HYSTERECTOMY ABDOMINAL W/ A&P REPAIR / OOPHORECTOMY  1996   benign tumor  . INNER EAR SURGERY  bilateral  . MITRAL VALVE REPAIR Right 07/17/2016   Procedure: MINIMALLY INVASIVE MITRAL VALVE REPAIR (MVR) USING 26 SORIN MEMO 3D ANNULOPLASTY RING;  Surgeon: Rexene Alberts, MD;  Location: [facility];  Service: Open Heart Surgery;  Laterality: Right;  . PATENT FORAMEN OVALE(PFO) CLOSURE N/A 07/17/2016   Procedure: PATENT FORAMEN OVALE (PFO) CLOSURE;  Surgeon: Rexene Alberts, MD;  Location: [place];  Service: Open Heart Surgery;  Laterality: N/A;  . RIGHT/LEFT HEART CATH AND CORONARY  ANGIOGRAPHY Bilateral 06/16/2016   Procedure: Right/Left Heart Cath and Coronary Angiography;  Surgeon: Wellington Hampshire, MD;  Location: [facility];  Service: Cardiovascular;  Laterality: Bilateral;  . TEE WITHOUT CARDIOVERSION N/A 05/19/2016   Procedure: TRANSESOPHAGEAL ECHOCARDIOGRAM (TEE);  Surgeon: Wellington Hampshire, MD;  Location: [facility];  Service: Cardiovascular;  Laterality: N/A;  . TEE WITHOUT CARDIOVERSION N/A 06/09/2016   Procedure: TRANSESOPHAGEAL ECHOCARDIOGRAM (TEE);  Surgeon: Wellington Hampshire, MD;  Location: [facility];  Service: Cardiovascular;  Laterality: N/A;  . TEE WITHOUT CARDIOVERSION N/A 06/16/2016   Procedure: TRANSESOPHAGEAL ECHOCARDIOGRAM (TEE);  Surgeon: Wellington Hampshire, MD;  Location: [facility];  Service: Cardiovascular;  Laterality: N/A;  . TEE WITHOUT CARDIOVERSION N/A 07/17/2016   Procedure: TRANSESOPHAGEAL ECHOCARDIOGRAM (TEE);  Surgeon: Rexene Alberts, MD;  Location: [facility];  Service: Open Heart Surgery;  Laterality: N/A;  . TONSILLECTOMY      OB History   No obstetric history on file.      Home Medications    Prior to Admission medications   Medication Sig Start Date End Date Taking? Authorizing Provider  acetaminophen (TYLENOL) 500 MG tablet Take 500 mg by mouth 2 (two) times daily as needed for moderate pain or headache.   Yes [provider]  albuterol (PROVENTIL HFA;VENTOLIN HFA) 108 (90 Base) MCG/ACT inhaler INHALE 2 PUFFS BY MOUTH INTO THE LUNGS EVERY 6 HOURS AS NEEDED FOR WHEEZING 09/08/17  Yes [name], MD  albuterol (PROVENTIL) (2.5 MG/3ML) 0.083% nebulizer solution Take 2.5 mg by nebulization every 6 (six) hours as needed for wheezing or shortness of breath.   Yes [provider]  Celedonio Miyamoto 62.5-25 MCG/INH AEPB INHALE 1 PUFF INTO THE LUNGS EVERY DAY 01/20/18  Yes [name], MD  aspirin EC 81 MG EC tablet Take 1 tablet (81 mg total) by mouth daily. 07/23/16  Yes Barrett, Erin R, PA-C  ergocalciferol (DRISDOL)  50000 units capsule Take 1 capsule (50,000 Units total) by mouth once a week. 08/02/17  Yes [name], MD  famotidine (PEPCID AC) 10 MG chewable tablet Chew 10 mg by mouth daily as needed for heartburn.   Yes [provider]  ferrous sulfate 325 (65 FE) MG tablet Take 325 mg by mouth daily with breakfast.   Yes [provider]  fluticasone (FLONASE) 50 MCG/ACT nasal spray USE 2 SPRAYS IN EACH NOSTRIL EVERY DAY 07/29/17  Yes [name], MD  furosemide (LASIX) 20 MG tablet TAKE 1 TABLET EVERY DAY 01/20/18  Yes [name], MD  Hypromellose (ISOPTO TEARS) 0.5 % SOLN Apply 1 drop to eye 2 (two) times daily.   Yes [provider]  levothyroxine (SYNTHROID, LEVOTHROID) 125 MCG tablet TAKE 1 TABLET EVERY DAY BEFORE BREAKFAST 01/20/18  Yes [name], MD  loratadine-pseudoephedrine (CLARITIN-D 24-HOUR) 10-240 MG 24 hr tablet Take 1 tablet by mouth daily.   Yes [provider]  losartan (COZAAR) 50 MG tablet TAKE 1 TABLET EVERY DAY 01/20/18  Yes Deborra Medina  L, MD  OXYGEN Inhale 2 L into the lungs at bedtime as needed (at bedtime and as needed).   Yes [provider]  oxymetazoline (AFRIN) 0.05 % nasal spray Place 1 spray into both nostrils 2 (two) times daily. As needed for nose bleed   Yes [provider]  polyethylene glycol powder (GLYCOLAX/MIRALAX) powder Take 17 g by mouth 2 (two) times daily as needed. Patient taking differently: Take 17 g by mouth daily as needed for mild constipation.  08/03/15  Yes [name], MD  traMADol (ULTRAM) 50 MG tablet Take 1-2 tablets (50-100 mg total) by mouth every 4 (four) hours as needed for moderate pain. 08/21/16  Yes [name], MD  trimethoprim (TRIMPEX) 100 MG tablet Take 1 tablet (100 mg total) by mouth 2 (two) times daily. 09/29/17  Yes Kordsmeier, Gregary Signs, FNP  vitamin B-12 (CYANOCOBALAMIN) 1000 MCG tablet Take 1 tablet (1,000 mcg total) by mouth daily. 08/14/16  Yes [name], MD     Family History Family History  Problem Relation Age of Onset  . Multiple sclerosis Mother   . Hypertension Mother   . Coronary artery disease Father   . Heart disease Father   . Hypertension Father   . Heart disease Brother   . Cancer Brother   . Breast cancer Paternal Aunt     Social History Social History   Tobacco Use  . Smoking status: Never Smoker  . Smokeless tobacco: Never Used  Substance Use Topics  . Alcohol use: No  . Drug use: No     Allergies   Codeine; Levaquin [levofloxacin]; Penicillins; Zithromax [azithromycin]; and Prednisone   Review of Systems Review of Systems  Respiratory: Positive for cough and wheezing.      Physical Exam Triage Vital Signs ED Triage Vitals  Enc Vitals Group     BP 04/28/18 1257 (!) 146/95     Pulse Rate 04/28/18 1257 79     Resp 04/28/18 1257 20     Temp 04/28/18 1257 98.2 F (36.8 C)     Temp Source 04/28/18 1257 Oral     SpO2 04/28/18 1257 98 %     Weight 04/28/18 1255 240 lb (108.9 kg)     Height 04/28/18 1255 5\' 2"  (1.575 m)     Head Circumference --      Peak Flow --      Pain Score 04/28/18 1255 0     Pain Loc --      Pain Edu? --      Excl. in [place]? --    No data found.  Updated Vital Signs BP (!) 146/95 (BP Location: Right Arm)   Pulse 79   Temp 98.2 F (36.8 C) (Oral)   Resp 20   Ht 5\' 2"  (1.575 m)   Wt 108.9 kg   SpO2 98%   BMI 43.90 kg/m   Visual Acuity Right Eye Distance:   Left Eye Distance:   Bilateral Distance:    Right Eye Near:   Left Eye Near:    Bilateral Near:     Physical Exam Vitals signs and nursing note reviewed.  Constitutional:      General: She is not in acute distress.    Appearance: She is not toxic-appearing or diaphoretic.  Pulmonary:     Effort: Pulmonary effort is normal. No respiratory distress.     Breath sounds: Wheezing present.     Comments: Decreased BS throughout Neurological:     Mental Status: She  is alert.      UC Treatments / Results   Labs (all labs ordered are listed, but only abnormal results are displayed) Labs Reviewed - No data to display  EKG None  Radiology No results found.  Procedures Procedures (including critical care time)  Medications Ordered in UC Medications  methylPREDNISolone sodium succinate (SOLU-MEDROL) 125 mg/2 mL injection 125 mg (125 mg Intramuscular Given 04/28/18 1327)    Initial Impression / Assessment and Plan / UC Course  I have reviewed the triage vital signs and the nursing notes.  Pertinent labs & imaging results that were available during my care of the patient were reviewed by me and considered in my medical decision making (see chart for details).      Final Clinical Impressions(s) / UC Diagnoses   Final diagnoses:  COPD exacerbation ([place])  (mild)   Discharge Instructions     Continue current home medications    ED Prescriptions    None      1. diagnosis reviewed with patient 2. Patient given solumedrol 125mg  IM x 1 3. Recommend continue current medications 4. Follow-up prn if symptoms worsen or don't improve   Controlled Substance Prescriptions [place] Controlled Substance Registry consulted? Not Applicable   Norval Gable, MD 04/28/18 [phone]

## 2018-04-28 NOTE — ED Triage Notes (Signed)
Patient c/o cough and wheezing that started 2 days ago. Patient has history of COPD. Patient has been using her inhaler with minimal relief.

## 2018-04-29 DIAGNOSIS — J9611 Chronic respiratory failure with hypoxia: Secondary | ICD-10-CM | POA: Diagnosis not present

## 2018-04-30 DIAGNOSIS — J9611 Chronic respiratory failure with hypoxia: Secondary | ICD-10-CM | POA: Diagnosis not present

## 2018-05-01 DIAGNOSIS — J9611 Chronic respiratory failure with hypoxia: Secondary | ICD-10-CM | POA: Diagnosis not present

## 2018-05-03 DIAGNOSIS — J9611 Chronic respiratory failure with hypoxia: Secondary | ICD-10-CM | POA: Diagnosis not present

## 2018-05-04 DIAGNOSIS — J9611 Chronic respiratory failure with hypoxia: Secondary | ICD-10-CM | POA: Diagnosis not present

## 2018-05-05 DIAGNOSIS — J9611 Chronic respiratory failure with hypoxia: Secondary | ICD-10-CM | POA: Diagnosis not present

## 2018-05-06 DIAGNOSIS — J9611 Chronic respiratory failure with hypoxia: Secondary | ICD-10-CM | POA: Diagnosis not present

## 2018-05-07 DIAGNOSIS — J9611 Chronic respiratory failure with hypoxia: Secondary | ICD-10-CM | POA: Diagnosis not present

## 2018-05-08 DIAGNOSIS — J449 Chronic obstructive pulmonary disease, unspecified: Secondary | ICD-10-CM | POA: Diagnosis not present

## 2018-05-08 DIAGNOSIS — J9611 Chronic respiratory failure with hypoxia: Secondary | ICD-10-CM | POA: Diagnosis not present

## 2018-05-10 DIAGNOSIS — J9611 Chronic respiratory failure with hypoxia: Secondary | ICD-10-CM | POA: Diagnosis not present

## 2018-05-11 DIAGNOSIS — J9611 Chronic respiratory failure with hypoxia: Secondary | ICD-10-CM | POA: Diagnosis not present

## 2018-05-12 DIAGNOSIS — J9611 Chronic respiratory failure with hypoxia: Secondary | ICD-10-CM | POA: Diagnosis not present

## 2018-05-13 DIAGNOSIS — J9611 Chronic respiratory failure with hypoxia: Secondary | ICD-10-CM | POA: Diagnosis not present

## 2018-05-14 DIAGNOSIS — J9611 Chronic respiratory failure with hypoxia: Secondary | ICD-10-CM | POA: Diagnosis not present

## 2018-05-15 DIAGNOSIS — J9611 Chronic respiratory failure with hypoxia: Secondary | ICD-10-CM | POA: Diagnosis not present

## 2018-05-17 DIAGNOSIS — J9611 Chronic respiratory failure with hypoxia: Secondary | ICD-10-CM | POA: Diagnosis not present

## 2018-05-18 DIAGNOSIS — J9611 Chronic respiratory failure with hypoxia: Secondary | ICD-10-CM | POA: Diagnosis not present

## 2018-05-19 DIAGNOSIS — J9611 Chronic respiratory failure with hypoxia: Secondary | ICD-10-CM | POA: Diagnosis not present

## 2018-05-20 DIAGNOSIS — J9611 Chronic respiratory failure with hypoxia: Secondary | ICD-10-CM | POA: Diagnosis not present

## 2018-05-21 DIAGNOSIS — J9611 Chronic respiratory failure with hypoxia: Secondary | ICD-10-CM | POA: Diagnosis not present

## 2018-05-22 DIAGNOSIS — J9611 Chronic respiratory failure with hypoxia: Secondary | ICD-10-CM | POA: Diagnosis not present

## 2018-05-24 DIAGNOSIS — J9611 Chronic respiratory failure with hypoxia: Secondary | ICD-10-CM | POA: Diagnosis not present

## 2018-05-25 DIAGNOSIS — J9611 Chronic respiratory failure with hypoxia: Secondary | ICD-10-CM | POA: Diagnosis not present

## 2018-05-26 DIAGNOSIS — J9611 Chronic respiratory failure with hypoxia: Secondary | ICD-10-CM | POA: Diagnosis not present

## 2018-05-27 DIAGNOSIS — J9611 Chronic respiratory failure with hypoxia: Secondary | ICD-10-CM | POA: Diagnosis not present

## 2018-05-28 DIAGNOSIS — J9611 Chronic respiratory failure with hypoxia: Secondary | ICD-10-CM | POA: Diagnosis not present

## 2018-05-29 DIAGNOSIS — J9611 Chronic respiratory failure with hypoxia: Secondary | ICD-10-CM | POA: Diagnosis not present

## 2018-05-31 DIAGNOSIS — J9611 Chronic respiratory failure with hypoxia: Secondary | ICD-10-CM | POA: Diagnosis not present

## 2018-06-01 DIAGNOSIS — J9611 Chronic respiratory failure with hypoxia: Secondary | ICD-10-CM | POA: Diagnosis not present

## 2018-06-02 DIAGNOSIS — J9611 Chronic respiratory failure with hypoxia: Secondary | ICD-10-CM | POA: Diagnosis not present

## 2018-06-03 DIAGNOSIS — J9611 Chronic respiratory failure with hypoxia: Secondary | ICD-10-CM | POA: Diagnosis not present

## 2018-06-04 DIAGNOSIS — J9611 Chronic respiratory failure with hypoxia: Secondary | ICD-10-CM | POA: Diagnosis not present

## 2018-06-05 DIAGNOSIS — J9611 Chronic respiratory failure with hypoxia: Secondary | ICD-10-CM | POA: Diagnosis not present

## 2018-06-06 DIAGNOSIS — J449 Chronic obstructive pulmonary disease, unspecified: Secondary | ICD-10-CM | POA: Diagnosis not present

## 2018-06-07 DIAGNOSIS — J9611 Chronic respiratory failure with hypoxia: Secondary | ICD-10-CM | POA: Diagnosis not present

## 2018-06-08 DIAGNOSIS — J9611 Chronic respiratory failure with hypoxia: Secondary | ICD-10-CM | POA: Diagnosis not present

## 2018-06-09 DIAGNOSIS — J9611 Chronic respiratory failure with hypoxia: Secondary | ICD-10-CM | POA: Diagnosis not present

## 2018-06-10 DIAGNOSIS — J9611 Chronic respiratory failure with hypoxia: Secondary | ICD-10-CM | POA: Diagnosis not present

## 2018-06-11 DIAGNOSIS — J9611 Chronic respiratory failure with hypoxia: Secondary | ICD-10-CM | POA: Diagnosis not present

## 2018-06-12 DIAGNOSIS — J9611 Chronic respiratory failure with hypoxia: Secondary | ICD-10-CM | POA: Diagnosis not present

## 2018-06-13 DIAGNOSIS — J9611 Chronic respiratory failure with hypoxia: Secondary | ICD-10-CM | POA: Diagnosis not present

## 2018-06-14 DIAGNOSIS — J9611 Chronic respiratory failure with hypoxia: Secondary | ICD-10-CM | POA: Diagnosis not present

## 2018-06-15 DIAGNOSIS — J9611 Chronic respiratory failure with hypoxia: Secondary | ICD-10-CM | POA: Diagnosis not present

## 2018-06-16 DIAGNOSIS — J9611 Chronic respiratory failure with hypoxia: Secondary | ICD-10-CM | POA: Diagnosis not present

## 2018-06-17 DIAGNOSIS — J9611 Chronic respiratory failure with hypoxia: Secondary | ICD-10-CM | POA: Diagnosis not present

## 2018-06-18 DIAGNOSIS — J9611 Chronic respiratory failure with hypoxia: Secondary | ICD-10-CM | POA: Diagnosis not present

## 2018-06-19 DIAGNOSIS — J9611 Chronic respiratory failure with hypoxia: Secondary | ICD-10-CM | POA: Diagnosis not present

## 2018-06-22 ENCOUNTER — Other Ambulatory Visit: Payer: Self-pay | Admitting: Internal Medicine

## 2018-06-22 DIAGNOSIS — I5032 Chronic diastolic (congestive) heart failure: Secondary | ICD-10-CM

## 2018-06-22 DIAGNOSIS — R42 Dizziness and giddiness: Secondary | ICD-10-CM

## 2018-07-07 DIAGNOSIS — J449 Chronic obstructive pulmonary disease, unspecified: Secondary | ICD-10-CM | POA: Diagnosis not present

## 2018-08-04 ENCOUNTER — Encounter: Payer: Medicare HMO | Admitting: Internal Medicine

## 2018-08-06 DIAGNOSIS — J449 Chronic obstructive pulmonary disease, unspecified: Secondary | ICD-10-CM | POA: Diagnosis not present

## 2018-08-30 DIAGNOSIS — J9611 Chronic respiratory failure with hypoxia: Secondary | ICD-10-CM | POA: Diagnosis not present

## 2018-08-31 DIAGNOSIS — J9611 Chronic respiratory failure with hypoxia: Secondary | ICD-10-CM | POA: Diagnosis not present

## 2018-09-01 DIAGNOSIS — J9611 Chronic respiratory failure with hypoxia: Secondary | ICD-10-CM | POA: Diagnosis not present

## 2018-09-02 DIAGNOSIS — J9611 Chronic respiratory failure with hypoxia: Secondary | ICD-10-CM | POA: Diagnosis not present

## 2018-09-03 DIAGNOSIS — J9611 Chronic respiratory failure with hypoxia: Secondary | ICD-10-CM | POA: Diagnosis not present

## 2018-09-04 DIAGNOSIS — J9611 Chronic respiratory failure with hypoxia: Secondary | ICD-10-CM | POA: Diagnosis not present

## 2018-09-06 DIAGNOSIS — J449 Chronic obstructive pulmonary disease, unspecified: Secondary | ICD-10-CM | POA: Diagnosis not present

## 2018-09-06 DIAGNOSIS — J9611 Chronic respiratory failure with hypoxia: Secondary | ICD-10-CM | POA: Diagnosis not present

## 2018-09-07 DIAGNOSIS — J9611 Chronic respiratory failure with hypoxia: Secondary | ICD-10-CM | POA: Diagnosis not present

## 2018-09-08 DIAGNOSIS — J9611 Chronic respiratory failure with hypoxia: Secondary | ICD-10-CM | POA: Diagnosis not present

## 2018-09-09 DIAGNOSIS — J9611 Chronic respiratory failure with hypoxia: Secondary | ICD-10-CM | POA: Diagnosis not present

## 2018-09-10 DIAGNOSIS — J9611 Chronic respiratory failure with hypoxia: Secondary | ICD-10-CM | POA: Diagnosis not present

## 2018-09-11 DIAGNOSIS — J9611 Chronic respiratory failure with hypoxia: Secondary | ICD-10-CM | POA: Diagnosis not present

## 2018-09-27 DIAGNOSIS — J9611 Chronic respiratory failure with hypoxia: Secondary | ICD-10-CM | POA: Diagnosis not present

## 2018-09-28 DIAGNOSIS — J9611 Chronic respiratory failure with hypoxia: Secondary | ICD-10-CM | POA: Diagnosis not present

## 2018-09-29 ENCOUNTER — Other Ambulatory Visit: Payer: Self-pay

## 2018-09-29 DIAGNOSIS — J9611 Chronic respiratory failure with hypoxia: Secondary | ICD-10-CM | POA: Diagnosis not present

## 2018-09-30 DIAGNOSIS — J9611 Chronic respiratory failure with hypoxia: Secondary | ICD-10-CM | POA: Diagnosis not present

## 2018-10-01 DIAGNOSIS — J9611 Chronic respiratory failure with hypoxia: Secondary | ICD-10-CM | POA: Diagnosis not present

## 2018-10-02 DIAGNOSIS — J9611 Chronic respiratory failure with hypoxia: Secondary | ICD-10-CM | POA: Diagnosis not present

## 2018-10-04 DIAGNOSIS — J9611 Chronic respiratory failure with hypoxia: Secondary | ICD-10-CM | POA: Diagnosis not present

## 2018-10-05 DIAGNOSIS — J9611 Chronic respiratory failure with hypoxia: Secondary | ICD-10-CM | POA: Diagnosis not present

## 2018-10-06 DIAGNOSIS — J9611 Chronic respiratory failure with hypoxia: Secondary | ICD-10-CM | POA: Diagnosis not present

## 2018-10-06 DIAGNOSIS — J449 Chronic obstructive pulmonary disease, unspecified: Secondary | ICD-10-CM | POA: Diagnosis not present

## 2018-10-07 DIAGNOSIS — J9611 Chronic respiratory failure with hypoxia: Secondary | ICD-10-CM | POA: Diagnosis not present

## 2018-10-08 ENCOUNTER — Encounter: Payer: Medicare HMO | Admitting: Internal Medicine

## 2018-10-08 DIAGNOSIS — J9611 Chronic respiratory failure with hypoxia: Secondary | ICD-10-CM | POA: Diagnosis not present

## 2018-10-09 DIAGNOSIS — J9611 Chronic respiratory failure with hypoxia: Secondary | ICD-10-CM | POA: Diagnosis not present

## 2018-10-25 ENCOUNTER — Other Ambulatory Visit: Payer: Self-pay | Admitting: Internal Medicine

## 2018-10-25 DIAGNOSIS — Z1231 Encounter for screening mammogram for malignant neoplasm of breast: Secondary | ICD-10-CM

## 2018-11-06 DIAGNOSIS — J449 Chronic obstructive pulmonary disease, unspecified: Secondary | ICD-10-CM | POA: Diagnosis not present

## 2018-11-10 ENCOUNTER — Other Ambulatory Visit: Payer: Self-pay

## 2018-11-10 ENCOUNTER — Ambulatory Visit
Admission: RE | Admit: 2018-11-10 | Discharge: 2018-11-10 | Disposition: A | Discharge status: Home / Self Care | Payer: Medicare HMO | Source: Ambulatory Visit | Attending: Internal Medicine | Admitting: Internal Medicine

## 2018-11-10 ENCOUNTER — Encounter (INDEPENDENT_AMBULATORY_CARE_PROVIDER_SITE_OTHER): Payer: Self-pay

## 2018-11-10 DIAGNOSIS — Z1231 Encounter for screening mammogram for malignant neoplasm of breast: Secondary | ICD-10-CM | POA: Diagnosis not present

## 2018-12-07 DIAGNOSIS — J449 Chronic obstructive pulmonary disease, unspecified: Secondary | ICD-10-CM | POA: Diagnosis not present

## 2018-12-15 ENCOUNTER — Encounter: Payer: Self-pay | Admitting: Internal Medicine

## 2018-12-15 ENCOUNTER — Ambulatory Visit (INDEPENDENT_AMBULATORY_CARE_PROVIDER_SITE_OTHER): Payer: Medicare HMO | Admitting: Internal Medicine

## 2018-12-15 VITALS — BP 140/85 | Ht 62.0 in | Wt 256.0 lb

## 2018-12-15 DIAGNOSIS — Z9889 Other specified postprocedural states: Secondary | ICD-10-CM | POA: Diagnosis not present

## 2018-12-15 DIAGNOSIS — J449 Chronic obstructive pulmonary disease, unspecified: Secondary | ICD-10-CM | POA: Diagnosis not present

## 2018-12-15 DIAGNOSIS — R600 Localized edema: Secondary | ICD-10-CM

## 2018-12-15 DIAGNOSIS — J42 Unspecified chronic bronchitis: Secondary | ICD-10-CM

## 2018-12-15 DIAGNOSIS — I480 Paroxysmal atrial fibrillation: Secondary | ICD-10-CM

## 2018-12-15 DIAGNOSIS — I5032 Chronic diastolic (congestive) heart failure: Secondary | ICD-10-CM

## 2018-12-15 DIAGNOSIS — R635 Abnormal weight gain: Secondary | ICD-10-CM

## 2018-12-15 DIAGNOSIS — I10 Essential (primary) hypertension: Secondary | ICD-10-CM | POA: Diagnosis not present

## 2018-12-15 DIAGNOSIS — E669 Obesity, unspecified: Secondary | ICD-10-CM | POA: Diagnosis not present

## 2018-12-15 DIAGNOSIS — G4736 Sleep related hypoventilation in conditions classified elsewhere: Secondary | ICD-10-CM

## 2018-12-15 DIAGNOSIS — I7101 Dissection of thoracic aorta: Secondary | ICD-10-CM

## 2018-12-15 DIAGNOSIS — R0601 Orthopnea: Secondary | ICD-10-CM | POA: Diagnosis not present

## 2018-12-15 DIAGNOSIS — I272 Pulmonary hypertension, unspecified: Secondary | ICD-10-CM

## 2018-12-15 DIAGNOSIS — I71019 Dissection of thoracic aorta, unspecified: Secondary | ICD-10-CM

## 2018-12-15 NOTE — Progress Notes (Signed)
Telephone  Note  This visit type was conducted due to national recommendations for restrictions regarding the COVID-19 pandemic (e.g. social distancing).  This format is felt to be most appropriate for this patient at this time.  All issues noted in this document were discussed and addressed.  No physical exam was performed (except for noted visual exam findings with Video Visits).   I connected with@ on 12/15/18 at  1:30 PM EDT by  telephone and verified that I am speaking with the correct person using two identifiers. Location patient: home Location provider: work or home office Persons participating in the virtual visit: patient, provider  I discussed the limitations, risks, security and privacy concerns of performing an evaluation and management service by telephone and the availability of in person appointments. I also discussed with the patient that there may be a patient responsible charge related to this service. The patient expressed understanding and agreed to proceed.  Reason for visit: weight gain   HPI:  63 yr old female with history of pulmonary hypertension secondary to mitral valve dysfunction,  S/p mitral valve repair and PFO closure in [id number] complicated by post operative atrial fibrillation.  Anticoagulation with warfarin as stopped in 2019 due to recurrent epistaxis.  Last seen by cardiology April 2019.  Last ECHO done in  2018 normal LV function moderate pulm HTN ,  With mild/mod TR   Cc: weight gain.  Not exercising , has not walked in 4 months.  Diet reviewed.  She is receiving Meals on wheels for lunch  And eats  cereal with 2% milk for breakfast.  Eats store bought chicken salad  On white bread for dinner    Chronic 2 pillow orthopnea,  No chest pain or SOB.  Has trace pitting edema on directed exam. using lasix daily    She has documented OSA but cannot tolerate mask.  Uses 2l oxygen by [place] at night.   Mood is depressed secondary to COVID restrictions.  Started walking  yesterday 0.25 mile   ROS: Patient denies headache, fevers, malaise, unintentional weight loss, skin rash, eye pain, sinus congestion and sinus pain, sore throat, dysphagia,  hemoptysis , cough, dyspnea, wheezing, chest pain, palpitations, orthopnea, edema, abdominal pain, nausea, melena, diarrhea, constipation, flank pain, dysuria, hematuria, urinary  Frequency, nocturia, numbness, tingling, seizures,  Focal weakness, Loss of consciousness,  Tremor, insomnia,  anxiety, and suicidal ideation.    .  Past Medical History:  Diagnosis Date  . Anxiety   . Bleeding from the nose   . Chronic diastolic congestive heart failure ([place])    a. 03/2016 Echo: EF 55-60%, Gr2 DD.  [name] Congenital absence of one kidney   . COPD (chronic obstructive pulmonary disease) ([place])   . Depression    treated at [facility]  . Dyspnea   . Emphysema/COPD ([place])   . GERD (gastroesophageal reflux disease)   . History of sciatica   . Hypertension   . Hypothyroidism   . Mitral Valve Prolapse & Severe mitral regurgitation s/p MVR    a. 03/2016 Echo: severe MVP involving the posterior leaflet, sev MR;  b. 06/2016 minimally invasive MV repair w/ triangular resection of flail segment (P2) of posterior leaflet, gore-tex neochord placement x 4, Sorin Memo 3D rin Annulosplasty (60mm, catalog # [id number], ser # [id number]).  . Motion sickness    cars  . Pneumonia   . PONV (postoperative nausea and vomiting)   . Post-op Afib    a. 06/2016 following MV repair-->amio (dose reduced  2/2 QT prolongation), coumadin.  . Pulmonary hypertension ([place])   . S/P patent foramen ovale closure    a. 06/2016 @ time of MV Repair.  . Sleep apnea    O2 at night and PRN    Past Surgical History:  Procedure Laterality Date  . ABDOMINAL HYSTERECTOMY    . BREAST BIOPSY Right 2011   [facility]< benign  . CATARACT EXTRACTION W/PHACO Left 11/14/2015   Procedure: CATARACT EXTRACTION PHACO AND INTRAOCULAR LENS PLACEMENT (IOC);  Surgeon: Leandrew Koyanagi, MD;   Location: [facility];  Service: Ophthalmology;  Laterality: Left;  sleep apnea Toric  . CATARACT EXTRACTION W/PHACO Right 12/19/2015   Procedure: CATARACT EXTRACTION PHACO AND INTRAOCULAR LENS PLACEMENT (IOC);  Surgeon: Leandrew Koyanagi, MD;  Location: [facility];  Service: Ophthalmology;  Laterality: Right;  ANXIETY GENEROUS IV SEDATION TORIC LEN  . COMBINED HYSTERECTOMY ABDOMINAL W/ A&P REPAIR / OOPHORECTOMY  1996   benign tumor  . INNER EAR SURGERY     bilateral  . MITRAL VALVE REPAIR Right 07/17/2016   Procedure: MINIMALLY INVASIVE MITRAL VALVE REPAIR (MVR) USING 26 SORIN MEMO 3D ANNULOPLASTY RING;  Surgeon: Rexene Alberts, MD;  Location: [facility];  Service: Open Heart Surgery;  Laterality: Right;  . PATENT FORAMEN OVALE(PFO) CLOSURE N/A 07/17/2016   Procedure: PATENT FORAMEN OVALE (PFO) CLOSURE;  Surgeon: Rexene Alberts, MD;  Location: [facility];  Service: Open Heart Surgery;  Laterality: N/A;  . RIGHT/LEFT HEART CATH AND CORONARY ANGIOGRAPHY Bilateral 06/16/2016   Procedure: Right/Left Heart Cath and Coronary Angiography;  Surgeon: Wellington Hampshire, MD;  Location: [facility];  Service: Cardiovascular;  Laterality: Bilateral;  . TEE WITHOUT CARDIOVERSION N/A 05/19/2016   Procedure: TRANSESOPHAGEAL ECHOCARDIOGRAM (TEE);  Surgeon: Wellington Hampshire, MD;  Location: [facility];  Service: Cardiovascular;  Laterality: N/A;  . TEE WITHOUT CARDIOVERSION N/A 06/09/2016   Procedure: TRANSESOPHAGEAL ECHOCARDIOGRAM (TEE);  Surgeon: Wellington Hampshire, MD;  Location: [facility];  Service: Cardiovascular;  Laterality: N/A;  . TEE WITHOUT CARDIOVERSION N/A 06/16/2016   Procedure: TRANSESOPHAGEAL ECHOCARDIOGRAM (TEE);  Surgeon: Wellington Hampshire, MD;  Location: [facility];  Service: Cardiovascular;  Laterality: N/A;  . TEE WITHOUT CARDIOVERSION N/A 07/17/2016   Procedure: TRANSESOPHAGEAL ECHOCARDIOGRAM (TEE);  Surgeon: Rexene Alberts, MD;  Location: [facility];  Service: Open Heart Surgery;   Laterality: N/A;  . TONSILLECTOMY      Family History  Problem Relation Age of Onset  . Multiple sclerosis Mother   . Hypertension Mother   . Coronary artery disease Father   . Heart disease Father   . Hypertension Father   . Heart disease Brother   . Cancer Brother   . Breast cancer Paternal Aunt     SOCIAL HX: LIVES IN A GROUP HOME  reports that she has never smoked. She has never used smokeless tobacco. She reports that she does not drink alcohol or use drugs.   Current Outpatient Medications:  .  acetaminophen (TYLENOL) 500 MG tablet, Take 500 mg by mouth 2 (two) times daily as needed for moderate pain or headache., Disp: , Rfl:  .  albuterol (PROVENTIL HFA;VENTOLIN HFA) 108 (90 Base) MCG/ACT inhaler, INHALE 2 PUFFS BY MOUTH INTO THE LUNGS EVERY 6 HOURS AS NEEDED FOR WHEEZING, Disp: 8.5 g, Rfl: 6 .  albuterol (PROVENTIL) (2.5 MG/3ML) 0.083% nebulizer solution, Take 2.5 mg by nebulization every 6 (six) hours as needed for wheezing or shortness of breath., Disp: , Rfl:  .  ANORO ELLIPTA 62.5-25 MCG/INH AEPB,  INHALE 1 PUFF INTO THE LUNGS EVERY DAY, Disp: 180 each, Rfl: 11 .  aspirin EC 81 MG EC tablet, Take 1 tablet (81 mg total) by mouth daily., Disp: , Rfl:  .  ergocalciferol (DRISDOL) 50000 units capsule, Take 1 capsule (50,000 Units total) by mouth once a week., Disp: 4 capsule, Rfl: 11 .  famotidine (PEPCID AC) 10 MG chewable tablet, Chew 10 mg by mouth daily as needed for heartburn., Disp: , Rfl:  .  ferrous sulfate 325 (65 FE) MG tablet, Take 325 mg by mouth daily with breakfast., Disp: , Rfl:  .  fluticasone (FLONASE) 50 MCG/ACT nasal spray, USE 2 SPRAYS IN EACH NOSTRIL EVERY DAY, Disp: 48 g, Rfl: 2 .  furosemide (LASIX) 20 MG tablet, TAKE 1 TABLET EVERY DAY, Disp: 90 tablet, Rfl: 1 .  Hypromellose (ISOPTO TEARS) 0.5 % SOLN, Apply 1 drop to eye 2 (two) times daily., Disp: , Rfl:  .  levothyroxine (SYNTHROID, LEVOTHROID) 125 MCG tablet, TAKE 1 TABLET EVERY DAY BEFORE BREAKFAST,  Disp: 90 tablet, Rfl: 1 .  loratadine-pseudoephedrine (CLARITIN-D 24-HOUR) 10-240 MG 24 hr tablet, Take 1 tablet by mouth daily., Disp: , Rfl:  .  losartan (COZAAR) 50 MG tablet, TAKE 1 TABLET EVERY DAY, Disp: 90 tablet, Rfl: 1 .  OXYGEN, Inhale 2 L into the lungs at bedtime as needed (at bedtime and as needed)., Disp: , Rfl:  .  polyethylene glycol powder (GLYCOLAX/MIRALAX) powder, Take 17 g by mouth 2 (two) times daily as needed. (Patient taking differently: Take 17 g by mouth daily as needed for mild constipation. ), Disp: 3350 g, Rfl: 1 .  traMADol (ULTRAM) 50 MG tablet, Take 1-2 tablets (50-100 mg total) by mouth every 4 (four) hours as needed for moderate pain., Disp: 30 tablet, Rfl: 0 .  vitamin B-12 (CYANOCOBALAMIN) 1000 MCG tablet, Take 1 tablet (1,000 mcg total) by mouth daily., Disp: 90 tablet, Rfl: 3 .  oxymetazoline (AFRIN) 0.05 % nasal spray, Place 1 spray into both nostrils 2 (two) times daily. As needed for nose bleed, Disp: , Rfl:   EXAM:   General impression: alert, cooperative and articulate.  No signs of being in distress  Lungs: speech is fluent sentence length suggests that patient is not short of breath and not punctuated by cough, sneezing or sniffing. [name]   Psych: affect normal.  speech is articulate and non pressured .  Denies suicidal thoughts   ASSESSMENT AND PLAN:   Pulmonary hypertension ([place])  Secondary to severe mitral regurgitation, untreated OSA, and moderate to severe obstructive airways disease .  Continue nocturnal oxygen,  Daily lasix,  And resume walking program    Nocturnal hypoxemia due to obstructive chronic bronchitis ([facility]) Continue nocturnal use of supplemental oxygen at 2 L/min  Chronic diastolic congestive heart failure ([place]) Managed with prn use of  lasix and losartan daily  Lab Results  Component Value Date   CREATININE 0.79 02/03/2018     Obesity (BMI 30-39.9) Recent weight gain addressed.  Identified areas of diet that need  addressing and encouraged to walk daily   COPD (chronic obstructive pulmonary disease) ([place]) She denies recurrence of  chest tightness in the morning.     I discussed the assessment and treatment plan with the patient. The patient was provided an opportunity to ask questions and all were answered. The patient agreed with the plan and demonstrated an understanding of the instructions.   The patient was advised to call back or seek an in-person evaluation if the  symptoms worsen or if the condition fails to improve as anticipated.  I provided  25 minutes of non-face-to-face time during this encounter reviewing patient's current problems and post surgeries.  Providing counseling on the above mentioned problems , and coordination  of care .   [name], MD

## 2018-12-18 DIAGNOSIS — I480 Paroxysmal atrial fibrillation: Secondary | ICD-10-CM | POA: Insufficient documentation

## 2018-12-18 DIAGNOSIS — I7101 Dissection of thoracic aorta: Secondary | ICD-10-CM | POA: Insufficient documentation

## 2018-12-18 NOTE — Assessment & Plan Note (Signed)
Managed with prn use of  lasix and losartan daily  Lab Results  Component Value Date   CREATININE 0.79 02/03/2018

## 2018-12-18 NOTE — Assessment & Plan Note (Signed)
She denies recurrence of  chest tightness in the morning.

## 2018-12-18 NOTE — Assessment & Plan Note (Signed)
Continue nocturnal use of supplemental oxygen at 2 L/min 

## 2018-12-18 NOTE — Assessment & Plan Note (Addendum)
Secondary to severe mitral regurgitation, untreated OSA, and moderate to severe obstructive airways disease .  Continue nocturnal oxygen,  Daily lasix,  And resume walking program

## 2018-12-18 NOTE — Assessment & Plan Note (Signed)
Recent weight gain addressed.  Identified areas of diet that need addressing and encouraged to walk daily

## 2018-12-20 ENCOUNTER — Telehealth: Payer: Self-pay | Admitting: *Deleted

## 2018-12-20 NOTE — Telephone Encounter (Signed)
Copied from [place] [id number]. Topic: General - Other >> Dec 20, 2018  9:53 AM Ivar Drape wrote: Reason for CRM:   Patient stated Dr. Derrel Nip wanted to see her in the office in the month of October and she needs to fast for some labs, so she needs an early appt.

## 2019-01-05 ENCOUNTER — Encounter: Payer: Self-pay | Admitting: Nurse Practitioner

## 2019-01-05 ENCOUNTER — Other Ambulatory Visit: Payer: Self-pay

## 2019-01-05 ENCOUNTER — Ambulatory Visit (INDEPENDENT_AMBULATORY_CARE_PROVIDER_SITE_OTHER): Payer: Medicare HMO | Admitting: Nurse Practitioner

## 2019-01-05 VITALS — BP 120/80 | HR 84 | Ht 62.0 in | Wt 251.0 lb

## 2019-01-05 DIAGNOSIS — I5032 Chronic diastolic (congestive) heart failure: Secondary | ICD-10-CM | POA: Diagnosis not present

## 2019-01-05 DIAGNOSIS — Z9889 Other specified postprocedural states: Secondary | ICD-10-CM | POA: Diagnosis not present

## 2019-01-05 DIAGNOSIS — I2721 Secondary pulmonary arterial hypertension: Secondary | ICD-10-CM | POA: Diagnosis not present

## 2019-01-05 DIAGNOSIS — J439 Emphysema, unspecified: Secondary | ICD-10-CM | POA: Diagnosis not present

## 2019-01-05 DIAGNOSIS — I272 Pulmonary hypertension, unspecified: Secondary | ICD-10-CM | POA: Diagnosis not present

## 2019-01-05 DIAGNOSIS — I5030 Unspecified diastolic (congestive) heart failure: Secondary | ICD-10-CM | POA: Diagnosis not present

## 2019-01-05 DIAGNOSIS — Z6841 Body Mass Index (BMI) 40.0 and over, adult: Secondary | ICD-10-CM | POA: Diagnosis not present

## 2019-01-05 DIAGNOSIS — J449 Chronic obstructive pulmonary disease, unspecified: Secondary | ICD-10-CM | POA: Diagnosis not present

## 2019-01-05 NOTE — Progress Notes (Signed)
Office Visit    Patient Name: Lauren Bartlett Date of Encounter: 01/05/2019  Primary Care Provider:  [name], MD Primary Cardiologist:  Kathlyn Sacramento, MD  Chief Complaint    63 y/o ? w/ a h/o MVP & Severe MR s/p MVR (06/2016), HTN, HFpEF, COPD, PAH, congential solitary kidney, and hypothyroidism, who presents for f/u related to HFpEF.  Past Medical History    Past Medical History:  Diagnosis Date   Anxiety    Bleeding from the nose    Chronic diastolic congestive heart failure ([place])    a. 03/2016 Echo: EF 55-60%, Gr2 DD; b. 08/2016 Echo: EF 60-65%, nl diast fxn.   Congenital absence of one kidney    COPD (chronic obstructive pulmonary disease) ([place])    a. 02/2017 PFT: mod/sev obstructive airway dzs w/ significant bronchodilator response.   Depression    [facility]   Dyspnea    Emphysema/COPD [facility])    GERD (gastroesophageal reflux disease)    History of cardiac cath    a. 05/2016 Cath: nl cors.   History of sciatica    Hypertension    Hypothyroidism    Mitral Valve Prolapse & Severe mitral regurgitation s/p MVR    a. 03/2016 Echo: severe MVP involving the posterior leaflet, sev MR;  b. 06/2016 min invasive MV repair w/ triangular resection of flail segment (P2) of posterior leaflet, gore-tex neochord placement x 4, Sorin Memo 3D rin Annulosplasty (11mm, catalog # [id number], ser # [id number]); c. 08/2016 Echo: EF 60-65%, no rwma, triv AI. MV area 2.06 cm^2 (pressure 1/2 time). Nl RV fxn. Mild-mod TR. PASP 31mmHg.   Motion sickness    cars   Pneumonia    PONV (postoperative nausea and vomiting)    Post-op Afib    a. 06/2016 following MV repair-->short-course amio. Coumadin d/c'd 2/2 epistaxis.   Pulmonary hypertension ([place])    a. 08/2016 Echo: PASP 17mmHg.   S/P patent foramen ovale closure    a. 06/2016 @ time of MV Repair.   Sleep apnea    O2 at night and PRN   Past Surgical History:  Procedure Laterality Date   ABDOMINAL HYSTERECTOMY       BREAST BIOPSY Right 2011   [facility]< benign   CATARACT EXTRACTION W/PHACO Left 11/14/2015   Procedure: CATARACT EXTRACTION PHACO AND INTRAOCULAR LENS PLACEMENT (IOC);  Surgeon: Leandrew Koyanagi, MD;  Location: [place];  Service: Ophthalmology;  Laterality: Left;  sleep apnea Toric   CATARACT EXTRACTION W/PHACO Right 12/19/2015   Procedure: CATARACT EXTRACTION PHACO AND INTRAOCULAR LENS PLACEMENT (IOC);  Surgeon: Leandrew Koyanagi, MD;  Location: [place];  Service: Ophthalmology;  Laterality: Right;  ANXIETY GENEROUS IV SEDATION TORIC LEN   COMBINED HYSTERECTOMY ABDOMINAL W/ A&P REPAIR / OOPHORECTOMY  1996   benign tumor   INNER EAR SURGERY     bilateral   MITRAL VALVE REPAIR Right 07/17/2016   Procedure: MINIMALLY INVASIVE MITRAL VALVE REPAIR (MVR) USING 26 SORIN MEMO 3D ANNULOPLASTY RING;  Surgeon: Rexene Alberts, MD;  Location: [place];  Service: Open Heart Surgery;  Laterality: Right;   PATENT FORAMEN OVALE(PFO) CLOSURE N/A 07/17/2016   Procedure: PATENT FORAMEN OVALE (PFO) CLOSURE;  Surgeon: Rexene Alberts, MD;  Location: [place];  Service: Open Heart Surgery;  Laterality: N/A;   RIGHT/LEFT HEART CATH AND CORONARY ANGIOGRAPHY Bilateral 06/16/2016   Procedure: Right/Left Heart Cath and Coronary Angiography;  Surgeon: Wellington Hampshire, MD;  Location: [place] [facility];  Service: Cardiovascular;  Laterality: Bilateral;   TEE WITHOUT CARDIOVERSION N/A 05/19/2016   Procedure: TRANSESOPHAGEAL ECHOCARDIOGRAM (TEE);  Surgeon: Wellington Hampshire, MD;  Location: [facility];  Service: Cardiovascular;  Laterality: N/A;   TEE WITHOUT CARDIOVERSION N/A 06/09/2016   Procedure: TRANSESOPHAGEAL ECHOCARDIOGRAM (TEE);  Surgeon: Wellington Hampshire, MD;  Location: [facility];  Service: Cardiovascular;  Laterality: N/A;   TEE WITHOUT CARDIOVERSION N/A 06/16/2016   Procedure: TRANSESOPHAGEAL ECHOCARDIOGRAM (TEE);  Surgeon: Wellington Hampshire, MD;  Location: [facility];  Service: Cardiovascular;   Laterality: N/A;   TEE WITHOUT CARDIOVERSION N/A 07/17/2016   Procedure: TRANSESOPHAGEAL ECHOCARDIOGRAM (TEE);  Surgeon: Rexene Alberts, MD;  Location: [place];  Service: Open Heart Surgery;  Laterality: N/A;   TONSILLECTOMY      Allergies  Allergies  Allergen Reactions   Codeine Hives and Nausea And Vomiting   Levaquin [Levofloxacin] Nausea And Vomiting   Penicillins Hives, Itching and Other (See Comments)    Has patient had a PCN reaction causing immediate rash, facial/tongue/throat swelling, SOB or lightheadedness with hypotension: No Has patient had a PCN reaction causing severe rash involving mucus membranes or skin necrosis: No Has patient had a PCN reaction that required hospitalization No Has patient had a PCN reaction occurring within the last 10 years: No If all of the above answers are "NO", then may proceed with Cephalosporin use.   Zithromax [Azithromycin] Other (See Comments)    Reaction:  Hallucinations    Prednisone Palpitations and Other (See Comments)    Reaction:  Hallucinations  Can take injections but not oral meds    History of Present Illness    63 y/o ? w/ a h/o MVP & Severe MR s/p MVR, HTN, HFpEF, COPD, PAH, and hypothyroidism.  She underwent successful MV repair w/ PFO closure in 06/2016.  Cath prior to surgery showed nl cors.  F/u echo in 08/2016 showed nl LVEF, nl fxn'ing MV, mild to mod TR, and PAH w/ a PASP of 49mmHg.  She was last seen in clinic in 01/2018, @ which time she reported stable DOE.  She notes that over the past 8 months, she has been more sedentary and staying inside.  She is scared with regards to COVID-19 and has tried to limit her exposure to other people.  In that setting, she is walking less and has gained 12 pounds since November 2019.  She does not think this is impacted her baseline level of dyspnea on exertion.  She occasionally notes lower extremity/pedal edema, especially after sitting for long periods of time.  This typically  resolves with keeping her legs elevated.  She had previously been eating a lot of processed foods and going out to eat.  She is now receiving Meals on Wheels.  She denies chest pain, palpitations, PND, orthopnea, dizziness, syncope, or early satiety.  Home Medications    Prior to Admission medications   Medication Sig Start Date End Date Taking? Authorizing Provider  acetaminophen (TYLENOL) 500 MG tablet Take 500 mg by mouth 2 (two) times daily as needed for moderate pain or headache.    [provider]  albuterol (PROVENTIL HFA;VENTOLIN HFA) 108 (90 Base) MCG/ACT inhaler INHALE 2 PUFFS BY MOUTH INTO THE LUNGS EVERY 6 HOURS AS NEEDED FOR WHEEZING 09/08/17   [name], MD  albuterol (PROVENTIL) (2.5 MG/3ML) 0.083% nebulizer solution Take 2.5 mg by nebulization every 6 (six) hours as needed for wheezing or shortness of breath.    [provider]  Celedonio Miyamoto 62.5-25  MCG/INH AEPB INHALE 1 PUFF INTO THE LUNGS EVERY DAY 01/20/18   [name], MD  aspirin EC 81 MG EC tablet Take 1 tablet (81 mg total) by mouth daily. 07/23/16   Barrett, Erin R, PA-C  ergocalciferol (DRISDOL) 50000 units capsule Take 1 capsule (50,000 Units total) by mouth once a week. 08/02/17   [name], MD  famotidine (PEPCID AC) 10 MG chewable tablet Chew 10 mg by mouth daily as needed for heartburn.    [provider]  ferrous sulfate 325 (65 FE) MG tablet Take 325 mg by mouth daily with breakfast.    [provider]  fluticasone (FLONASE) 50 MCG/ACT nasal spray USE 2 SPRAYS IN EACH NOSTRIL EVERY DAY 07/29/17   [name], MD  furosemide (LASIX) 20 MG tablet TAKE 1 TABLET EVERY DAY 06/22/18   [name], MD  Hypromellose (ISOPTO TEARS) 0.5 % SOLN Apply 1 drop to eye 2 (two) times daily.    [provider]  levothyroxine (SYNTHROID, LEVOTHROID) 125 MCG tablet TAKE 1 TABLET EVERY DAY BEFORE BREAKFAST 06/22/18   [name], MD  loratadine-pseudoephedrine (CLARITIN-D  24-HOUR) 10-240 MG 24 hr tablet Take 1 tablet by mouth daily.    [provider]  losartan (COZAAR) 50 MG tablet TAKE 1 TABLET EVERY DAY 06/22/18   [name], MD  OXYGEN Inhale 2 L into the lungs at bedtime as needed (at bedtime and as needed).    [provider]  oxymetazoline (AFRIN) 0.05 % nasal spray Place 1 spray into both nostrils 2 (two) times daily. As needed for nose bleed    [provider]  polyethylene glycol powder (GLYCOLAX/MIRALAX) powder Take 17 g by mouth 2 (two) times daily as needed. Patient taking differently: Take 17 g by mouth daily as needed for mild constipation.  08/03/15   [name], MD  traMADol (ULTRAM) 50 MG tablet Take 1-2 tablets (50-100 mg total) by mouth every 4 (four) hours as needed for moderate pain. 08/21/16   [name], MD  vitamin B-12 (CYANOCOBALAMIN) 1000 MCG tablet Take 1 tablet (1,000 mcg total) by mouth daily. 08/14/16   [name], MD    Review of Systems    Some degree of chronic dyspnea on exertion which she notes has been stable.  She sometimes notes mild pedal edema especially if legs are dependent for long periods.  Ambulates with a walker.  She denies chest pain, palpitations, PND, orthopnea, dizziness, syncope, or early satiety.  All other systems reviewed and are otherwise negative except as noted above.  Physical Exam    VS:  BP 120/80 (BP Location: Right Arm, Patient Position: Sitting, Cuff Size: Normal)    Pulse 84    Ht 5\' 2"  (1.575 m)    Wt 251 lb (113.9 kg)    SpO2 94%    BMI 45.91 kg/m  , BMI Body mass index is 45.91 kg/m. GEN: Well nourished, well developed, in no acute distress. HEENT: normal. Neck: Supple, no JVD, carotid bruits, or masses. Cardiac: RRR, no murmurs, rubs, or gallops. No clubbing, cyanosis, edema.  Radials/PT 2+ and equal bilaterally.  Respiratory:  Respirations regular and unlabored, clear to auscultation bilaterally. GI: Soft, nontender, nondistended, BS + x 4. MS: no  deformity or atrophy. Skin: warm and dry, no rash. Neuro:  Strength and sensation are intact. Psych: Normal affect.  Accessory Clinical Findings    ECG personally reviewed by me today -regular sinus rhythm, 84 nonspecific  ST changes - no acute changes.  Lab Results  Component Value Date   WBC 9.5 10/20/2016   HGB 13.7 10/20/2016   HCT 42.3 10/20/2016   MCV 87.2 10/20/2016   PLT 200 10/20/2016   Lab Results  Component Value Date   CREATININE 0.79 02/03/2018   BUN 15 02/03/2018   NA 141 02/03/2018   K 4.3 02/03/2018   CL 103 02/03/2018   CO2 31 02/03/2018   Lab Results  Component Value Date   ALT 14 02/03/2018   AST 17 02/03/2018   ALKPHOS 66 02/03/2018   BILITOT 0.5 02/03/2018   Lab Results  Component Value Date   CHOL 173 03/14/2015   HDL 57.10 03/14/2015   LDLCALC 99 03/14/2015   LDLDIRECT 101.0 07/30/2017   TRIG 85.0 03/14/2015   CHOLHDL 3 03/14/2015     Assessment & Plan    1.  HFpEF/pulmonary hypertension: Patient is up 12 pounds since her last visit in November 2019 however, she appears to be euvolemic on examination.  Heart rate and blood pressure are well controlled.  She does frequently eat processed foods and goes out to eat often.  We discussed the importance of limiting sodium intake.  She continues to wear oxygen at night only.  Follow-up echocardiogram to reassess pulmonary hypertension as it has been 2 years.  Continue daily ARB and Lasix.  2.  Status post mitral valve repair: Stable by echo in 2018.  Following up echo in the setting of above.  3.  Essential hypertension: Stable on ARB and diuretic therapy.  4.  COPD: Seems to be well treated with on inhaler therapy.  No active wheezing.  She uses oxygen at night.  5.  Disposition: Follow-up echocardiogram.  Follow-up in clinic in 6 months or sooner if necessary.   Murray Hodgkins, NP 01/05/2019, 12:44 PM

## 2019-01-05 NOTE — Patient Instructions (Signed)
Medication Instructions:  Your physician recommends that you continue on your current medications as directed. Please refer to the Current Medication list given to you today.  If you need a refill on your cardiac medications before your next appointment, please call your pharmacy.   Lab work: None ordered  If you have labs (blood work) drawn today and your tests are completely normal, you will receive your results only by: [name] MyChart Message (if you have MyChart) OR . A paper copy in the mail If you have any lab test that is abnormal or we need to change your treatment, we will call you to review the results.  Testing/Procedures: 1- Echo  Please return to [facility] on ______________ at _______________ AM/PM for an Echocardiogram. Your physician has requested that you have an echocardiogram. Echocardiography is a painless test that uses sound waves to create images of your heart. It provides your doctor with information about the size and shape of your heart and how well your heart's chambers and valves are working. This procedure takes approximately one hour. There are no restrictions for this procedure. Please note; depending on visual quality an IV may need to be placed.    Follow-Up: At [facility], you and your health needs are our priority.  As part of our continuing mission to provide you with exceptional heart care, we have created designated Provider Care Teams.  These Care Teams include your primary Cardiologist (physician) and Advanced Practice Providers (APPs -  Physician Assistants and Nurse Practitioners) who all work together to provide you with the care you need, when you need it. You will need a follow up appointment in 12 months.  Please call our office 2 months in advance to schedule this appointment.  You may see Kathlyn Sacramento, MD or one of the following Advanced Practice Providers on your designated Care Team:   Murray Hodgkins, NP Christell Faith,  PA-C . Marrianne Mood, PA-C

## 2019-01-06 DIAGNOSIS — J449 Chronic obstructive pulmonary disease, unspecified: Secondary | ICD-10-CM | POA: Diagnosis not present

## 2019-01-07 ENCOUNTER — Telehealth: Payer: Self-pay

## 2019-01-07 ENCOUNTER — Other Ambulatory Visit: Payer: Self-pay | Admitting: Internal Medicine

## 2019-01-07 DIAGNOSIS — R42 Dizziness and giddiness: Secondary | ICD-10-CM

## 2019-01-07 DIAGNOSIS — I5032 Chronic diastolic (congestive) heart failure: Secondary | ICD-10-CM

## 2019-01-07 NOTE — Telephone Encounter (Signed)
Copied from [place] [phone]. Topic: General - Other >> Jan 07, 2019 11:43 AM Leward Quan A wrote: Reason for CRM: Patient called to inform Dr Derrel Nip that [facility] will be sending over refill request for her medications. Please be on the look out for this she states that she is running low on medications.

## 2019-01-11 ENCOUNTER — Other Ambulatory Visit: Payer: Self-pay | Admitting: Internal Medicine

## 2019-01-11 MED ORDER — ANORO ELLIPTA 62.5-25 MCG/INH IN AEPB: INHALATION_SPRAY | RESPIRATORY_TRACT | 11 refills | Status: DC

## 2019-01-11 MED ORDER — LEVOTHYROXINE SODIUM 125 MCG PO TABS: ORAL_TABLET | ORAL | 1 refills | Status: DC

## 2019-01-11 MED ORDER — LOSARTAN POTASSIUM 50 MG PO TABS: 50.0000 mg | ORAL_TABLET | Freq: Every day | ORAL | 1 refills | Status: DC

## 2019-01-11 NOTE — Telephone Encounter (Signed)
Medication Refill - Medication:  levothyroxine (SYNTHROID, LEVOTHROID) 125 MCG tablet  losartan (COZAAR) 50 MG tablet ANORO ELLIPTA 62.5-25 MCG/INH AEPB  Has the patient contacted their pharmacy? Yes advised to call.   Preferred Pharmacy (with phone number or street name):  [place], [place] [phone] (Phone) [phone] (Fax)   Agent: Please be advised that RX refills may take up to 3 business days. We ask that you follow-up with your pharmacy.

## 2019-01-12 ENCOUNTER — Other Ambulatory Visit: Payer: Self-pay | Admitting: Internal Medicine

## 2019-01-12 NOTE — Telephone Encounter (Signed)
All medications were sent in on 01/10/2019.

## 2019-01-24 ENCOUNTER — Other Ambulatory Visit: Payer: Self-pay

## 2019-01-26 ENCOUNTER — Encounter: Payer: Self-pay | Admitting: Internal Medicine

## 2019-01-26 ENCOUNTER — Ambulatory Visit (INDEPENDENT_AMBULATORY_CARE_PROVIDER_SITE_OTHER): Payer: Medicare HMO | Admitting: Internal Medicine

## 2019-01-26 ENCOUNTER — Other Ambulatory Visit: Payer: Self-pay

## 2019-01-26 VITALS — BP 138/84 | HR 79 | Temp 97.7°F | Resp 16 | Ht 62.0 in | Wt 250.8 lb

## 2019-01-26 DIAGNOSIS — I10 Essential (primary) hypertension: Secondary | ICD-10-CM

## 2019-01-26 DIAGNOSIS — R0601 Orthopnea: Secondary | ICD-10-CM | POA: Diagnosis not present

## 2019-01-26 DIAGNOSIS — R600 Localized edema: Secondary | ICD-10-CM

## 2019-01-26 DIAGNOSIS — E669 Obesity, unspecified: Secondary | ICD-10-CM

## 2019-01-26 DIAGNOSIS — R635 Abnormal weight gain: Secondary | ICD-10-CM

## 2019-01-26 DIAGNOSIS — E559 Vitamin D deficiency, unspecified: Secondary | ICD-10-CM

## 2019-01-26 DIAGNOSIS — Z23 Encounter for immunization: Secondary | ICD-10-CM | POA: Diagnosis not present

## 2019-01-26 DIAGNOSIS — E034 Atrophy of thyroid (acquired): Secondary | ICD-10-CM

## 2019-01-26 NOTE — Progress Notes (Addendum)
Subjective:  Patient ID: Lauren Bartlett, female    DOB: 07-03-1955  Age: 63 y.o. MRN: [id number]  CC: The primary encounter diagnosis was Vitamin D deficiency. Diagnoses of Localized edema, Orthopnea, Essential hypertension, Weight gain, Need for immunization against influenza, Obesity (BMI 30-39.9), and Hypothyroidism due to acquired atrophy of thyroid were also pertinent to this visit.  HPI Lauren Bartlett presents for one month follow up on depression,  Weight gain and IPG  She has had a 35 lb wt gain since reaching nadir of 215 in 2018  Walking twice daily   1/4 mile.  Gets sob with walking after a few minutes.  No chest pain .  Patient is taking her medications as prescribed and notes no adverse effects.  Home BP readings have been done about once per week and are  generally < 130/80 .  She is avoiding added salt in her diet and walking regularly about 3 times per week for exercise  .  Diet reviewed again.  One serving of vegetables daily and one of  fruit  in  the hot meal provided by "Meals on Wheels."  She has been  stressed and overwhelmed by living alone during the [place] 19 Pandemic. Electronics and internet issues that she cant understand frustrates er  then gets upset and tearful.  Has nephews nearby and a brother   Outpatient Medications Prior to Visit  Medication Sig Dispense Refill  . acetaminophen (TYLENOL) 500 MG tablet Take 500 mg by mouth 2 (two) times daily as needed for moderate pain or headache.    . albuterol (PROVENTIL) (2.5 MG/3ML) 0.083% nebulizer solution Take 2.5 mg by nebulization every 6 (six) hours as needed for wheezing or shortness of breath.    [name] albuterol (VENTOLIN HFA) 108 (90 Base) MCG/ACT inhaler INHALE 2 PUFFS INTO THE LUNGS EVERY 6 (SIX) HOURS AS NEEDED FOR WHEEZING OR SHORTNESS OF BREATH.  (USE WITH SPACER) 18 g 2  . aspirin EC 81 MG EC tablet Take 1 tablet (81 mg total) by mouth daily.    . famotidine (PEPCID AC) 10 MG chewable tablet Chew 10 mg by  mouth daily as needed for heartburn.    . ferrous sulfate 325 (65 FE) MG tablet Take 325 mg by mouth daily with breakfast.    . fluticasone (FLONASE) 50 MCG/ACT nasal spray USE 2 SPRAYS IN EACH NOSTRIL EVERY DAY 48 g 2  . furosemide (LASIX) 20 MG tablet TAKE 1 TABLET EVERY DAY 90 tablet 1  . Hypromellose (ISOPTO TEARS) 0.5 % SOLN Apply 1 drop to eye 2 (two) times daily.    [name] loratadine-pseudoephedrine (CLARITIN-D 24-HOUR) 10-240 MG 24 hr tablet Take 1 tablet by mouth daily.    [name] losartan (COZAAR) 50 MG tablet TAKE 1 TABLET EVERY DAY 90 tablet 1  . OXYGEN Inhale 2 L into the lungs at bedtime as needed (at bedtime and as needed).    . polyethylene glycol powder (GLYCOLAX/MIRALAX) powder Take 17 g by mouth 2 (two) times daily as needed. (Patient taking differently: Take 17 g by mouth daily as needed for mild constipation. ) 3350 g 1  . traMADol (ULTRAM) 50 MG tablet Take 1-2 tablets (50-100 mg total) by mouth every 4 (four) hours as needed for moderate pain. 30 tablet 0  . umeclidinium-vilanterol (ANORO ELLIPTA) 62.5-25 MCG/INH AEPB INHALE 1 PUFF INTO THE LUNGS EVERY DAY 180 each 11  . vitamin B-12 (CYANOCOBALAMIN) 1000 MCG tablet Take 1 tablet (1,000 mcg total) by mouth daily. [place]  tablet 3  . levothyroxine (SYNTHROID) 125 MCG tablet TAKE 1 TABLET EVERY DAY BEFORE BREAKFAST 90 tablet 1  . oxymetazoline (AFRIN) 0.05 % nasal spray Place 1 spray into both nostrils 2 (two) times daily. As needed for nose bleed    . ergocalciferol (DRISDOL) 50000 units capsule Take 1 capsule (50,000 Units total) by mouth once a week. (Patient not taking: Reported on 01/26/2019) 4 capsule 11   No facility-administered medications prior to visit.     Review of Systems;  Patient denies headache, fevers, malaise, unintentional weight loss, skin rash, eye pain, sinus congestion and sinus pain, sore throat, dysphagia,  hemoptysis , cough, dyspnea, wheezing, chest pain, palpitations, orthopnea, edema, abdominal pain, nausea,  melena, diarrhea, constipation, flank pain, dysuria, hematuria, urinary  Frequency, nocturia, numbness, tingling, seizures,  Focal weakness, Loss of consciousness,  Tremor, insomnia, depression, anxiety, and suicidal ideation.      Objective:  BP 138/84 (BP Location: Right Arm, Patient Position: Sitting, Cuff Size: Large)   Pulse 79   Temp 97.7 F (36.5 C) (Temporal)   Resp 16   Ht 5\' 2"  (1.575 m)   Wt 250 lb 12.8 oz (113.8 kg)   SpO2 97%   BMI 45.87 kg/m   BP Readings from Last 3 Encounters:  01/26/19 138/84  01/05/19 120/80  12/15/18 140/85    Wt Readings from Last 3 Encounters:  01/26/19 250 lb 12.8 oz (113.8 kg)  01/05/19 251 lb (113.9 kg)  12/15/18 256 lb (116.1 kg)    General appearance: alert, cooperative and appears stated age Ears: normal TM's and external ear canals both ears Throat: lips, mucosa, and tongue normal; teeth and gums normal Neck: no adenopathy, no carotid bruit, supple, symmetrical, trachea midline and thyroid not enlarged, symmetric, no tenderness/mass/nodules Back: symmetric, no curvature. ROM normal. No CVA tenderness. Lungs: clear to auscultation bilaterally Heart: regular rate and rhythm, S1, S2 normal, no murmur, click, rub or gallop Abdomen: soft, non-tender; bowel sounds normal; no masses,  no organomegaly Pulses: 2+ and symmetric Skin: Skin color, texture, turgor normal. No rashes or lesions Lymph nodes: Cervical, supraclavicular, and axillary nodes normal.  Lab Results  Component Value Date   HGBA1C 5.5 01/26/2019   HGBA1C 5.3 07/30/2017   HGBA1C 5.1 07/14/2016    Lab Results  Component Value Date   CREATININE 0.99 01/26/2019   CREATININE 0.79 02/03/2018   CREATININE 0.81 07/30/2017    Lab Results  Component Value Date   WBC 9.5 10/20/2016   HGB 13.7 10/20/2016   HCT 42.3 10/20/2016   PLT 200 10/20/2016   GLUCOSE 80 01/26/2019   CHOL 200 01/26/2019   TRIG 111.0 01/26/2019   HDL 57.40 01/26/2019   LDLDIRECT 101.0  07/30/2017   LDLCALC 121 (H) 01/26/2019   ALT 15 01/26/2019   AST 19 01/26/2019   NA 138 01/26/2019   K 4.3 01/26/2019   CL 98 01/26/2019   CREATININE 0.99 01/26/2019   BUN 16 01/26/2019   CO2 31 01/26/2019   TSH 5.82 (H) 01/26/2019   INR 1.4 08/26/2016   HGBA1C 5.5 01/26/2019    Mm 3d Screen Breast Bilateral  Result Date: 11/10/2018 CLINICAL DATA:  Screening. EXAM: DIGITAL SCREENING BILATERAL MAMMOGRAM WITH TOMO AND CAD COMPARISON:  Previous exam(s). ACR Breast Density Category b: There are scattered areas of fibroglandular density. FINDINGS: There are no findings suspicious for malignancy. Images were processed with CAD. IMPRESSION: No mammographic evidence of malignancy. A result letter of this screening mammogram will be mailed directly to the  patient. RECOMMENDATION: Screening mammogram in one year. (Code:SM-B-01Y) BI-RADS CATEGORY  1: Negative. Electronically Signed   By: Audie Pinto M.D.   On: 11/10/2018 15:45    Assessment & Plan:   Problem List Items Addressed This Visit      Unprioritized   Hypothyroidism (Chronic)    Patient's thyroid function is underactive on current levothyroxine dose of 125  mcg daily.  UNFORTUNATELY  IT WAS JUST FOR 90 DAys.  Will advise patient to continue 125 mcg daily and take 250 mcg (2 talbets) on Sundays only,  until she uses up the current suppyl.  New rx for 137 mcg sent to local pharmacy with instructiion not to fill but keep on file for next refill       Hypertension (Chronic)    Well controlled on current regimen. Renal function is due  Lab Results  Component Value Date   CREATININE 0.79 02/03/2018   Lab Results  Component Value Date   NA 141 02/03/2018   K 4.3 02/03/2018   CL 103 02/03/2018   CO2 31 02/03/2018         Obesity (BMI 30-39.9) (Chronic)    Recent weight gain addressed.  Identified areas of diet that need addressing and encouraged to increase her distance walking weekly.       Vitamin D deficiency -  Primary (Chronic)   Relevant Orders   VITAMIN D 25 Hydroxy (Vit-D Deficiency, Fractures) (Completed)    Other Visit Diagnoses    Localized edema       Relevant Orders   B Nat Peptide (Completed)   Orthopnea       Relevant Orders   B Nat Peptide (Completed)   Weight gain       Need for immunization against influenza       Relevant Orders   Flu Vaccine QUAD 36+ mos IM (Completed)      I have discontinued Lauren Bartlett's ergocalciferol. I am also having her maintain her loratadine-pseudoephedrine, polyethylene glycol powder, OXYGEN, albuterol, famotidine, acetaminophen, Hypromellose, aspirin, ferrous sulfate, vitamin B-12, traMADol, oxymetazoline, fluticasone, furosemide, albuterol, Anoro Ellipta, and losartan.  No orders of the defined types were placed in this encounter.   Medications Discontinued During This Encounter  Medication Reason  . ergocalciferol (DRISDOL) 50000 units capsule Completed Course    Follow-up: No follow-ups on file.   [name], MD

## 2019-01-26 NOTE — Patient Instructions (Signed)
Increase your walking to 2 laps  around the building   Twice daily  Every month add another lap  See you in 3 months.  Goal is 12 lbs by then

## 2019-01-27 LAB — COMPREHENSIVE METABOLIC PANEL
ALT: 15 U/L (ref 0–35)
AST: 19 U/L (ref 0–37)
Albumin: 4.3 g/dL (ref 3.5–5.2)
Alkaline Phosphatase: 69 U/L (ref 39–117)
BUN: 16 mg/dL (ref 6–23)
CO2: 31 mEq/L (ref 19–32)
Calcium: 8.3 mg/dL — ABNORMAL LOW (ref 8.4–10.5)
Chloride: 98 mEq/L (ref 96–112)
Creatinine, Ser: 0.99 mg/dL (ref 0.40–1.20)
GFR: 56.63 mL/min — ABNORMAL LOW (ref >60.00)
Glucose, Bld: 80 mg/dL (ref 70–99)
Potassium: 4.3 mEq/L (ref 3.5–5.1)
Sodium: 138 mEq/L (ref 135–145)
Total Bilirubin: 0.7 mg/dL (ref 0.2–1.2)
Total Protein: 7.2 g/dL (ref 6.0–8.3)

## 2019-01-27 LAB — LIPID PANEL
Cholesterol: 200 mg/dL (ref 0–200)
HDL: 57.4 mg/dL (ref >39.00)
LDL Cholesterol: 121 mg/dL — ABNORMAL HIGH (ref 0–99)
NonHDL: 142.98
Total CHOL/HDL Ratio: 3
Triglycerides: 111 mg/dL (ref 0.0–149.0)
VLDL: 22.2 mg/dL (ref 0.0–40.0)

## 2019-01-27 LAB — HEMOGLOBIN A1C: Hgb A1c MFr Bld: 5.5 % (ref 4.6–6.5)

## 2019-01-27 LAB — BRAIN NATRIURETIC PEPTIDE: Brain Natriuretic Peptide: 61 pg/mL (ref <100)

## 2019-01-27 NOTE — Assessment & Plan Note (Signed)
Well controlled on current regimen. Renal function is due  Lab Results  Component Value Date   CREATININE 0.79 02/03/2018   Lab Results  Component Value Date   NA 141 02/03/2018   K 4.3 02/03/2018   CL 103 02/03/2018   CO2 31 02/03/2018

## 2019-01-27 NOTE — Assessment & Plan Note (Addendum)
Patient's thyroid function is underactive on current levothyroxine dose of 125  mcg daily.  UNFORTUNATELY  IT WAS JUST FOR 90 DAys.  Will advise patient to continue 125 mcg daily and take 250 mcg (2 talbets) on Sundays only,  until she uses up the current suppyl.  New rx for 137 mcg sent to local pharmacy with instructiion not to fill but keep on file for next refill

## 2019-01-27 NOTE — Assessment & Plan Note (Signed)
Recent weight gain addressed.  Identified areas of diet that need addressing and encouraged to increase her distance walking weekly.

## 2019-01-28 LAB — TSH: TSH: 5.82 u[IU]/mL — ABNORMAL HIGH (ref 0.35–4.50)

## 2019-01-28 LAB — VITAMIN D 25 HYDROXY (VIT D DEFICIENCY, FRACTURES): VITD: 22.42 ng/mL — ABNORMAL LOW (ref 30.00–100.00)

## 2019-01-30 ENCOUNTER — Other Ambulatory Visit: Payer: Self-pay | Admitting: Internal Medicine

## 2019-01-30 DIAGNOSIS — E034 Atrophy of thyroid (acquired): Secondary | ICD-10-CM

## 2019-01-30 MED ORDER — LEVOTHYROXINE SODIUM 137 MCG PO TABS: 137.0000 ug | ORAL_TABLET | Freq: Every day | ORAL | 1 refills | Status: DC

## 2019-01-30 NOTE — Assessment & Plan Note (Signed)
Patient's thyroid function is underactive on current levothyroxine dose of 125  mcg daily.  UNFORTUNATELY  IT WAS JUST FOR 90 DAys.  Will advise patient to continue 125 mcg daily and take 250 mcg (2 talbets) on Sundays only,  until she uses up the current suppyl.  New rx for 137 mcg sent to local pharmacy with instructiion not to fill but keep on file for next refill

## 2019-02-03 ENCOUNTER — Other Ambulatory Visit: Payer: Self-pay | Admitting: Internal Medicine

## 2019-02-03 MED ORDER — ERGOCALCIFEROL 1.25 MG (50000 UT) PO CAPS: 50000.0000 [IU] | ORAL_CAPSULE | ORAL | 0 refills | Status: DC

## 2019-02-03 NOTE — Progress Notes (Signed)
Mega D sent to [facility]

## 2019-02-06 DIAGNOSIS — J449 Chronic obstructive pulmonary disease, unspecified: Secondary | ICD-10-CM | POA: Diagnosis not present

## 2019-02-14 ENCOUNTER — Ambulatory Visit (INDEPENDENT_AMBULATORY_CARE_PROVIDER_SITE_OTHER): Payer: Medicare HMO

## 2019-02-14 ENCOUNTER — Other Ambulatory Visit: Payer: Self-pay

## 2019-02-14 DIAGNOSIS — Z9889 Other specified postprocedural states: Secondary | ICD-10-CM | POA: Diagnosis not present

## 2019-02-14 DIAGNOSIS — R0602 Shortness of breath: Secondary | ICD-10-CM | POA: Diagnosis not present

## 2019-02-15 ENCOUNTER — Telehealth: Payer: Self-pay

## 2019-02-15 ENCOUNTER — Encounter: Payer: Self-pay | Admitting: Internal Medicine

## 2019-02-15 ENCOUNTER — Ambulatory Visit (INDEPENDENT_AMBULATORY_CARE_PROVIDER_SITE_OTHER): Payer: Medicare HMO | Admitting: Internal Medicine

## 2019-02-15 ENCOUNTER — Ambulatory Visit (INDEPENDENT_AMBULATORY_CARE_PROVIDER_SITE_OTHER): Payer: Medicare HMO

## 2019-02-15 VITALS — BP 126/76 | Ht 62.0 in | Wt 252.8 lb

## 2019-02-15 DIAGNOSIS — E669 Obesity, unspecified: Secondary | ICD-10-CM

## 2019-02-15 DIAGNOSIS — J449 Chronic obstructive pulmonary disease, unspecified: Secondary | ICD-10-CM | POA: Diagnosis not present

## 2019-02-15 DIAGNOSIS — E034 Atrophy of thyroid (acquired): Secondary | ICD-10-CM

## 2019-02-15 DIAGNOSIS — G4736 Sleep related hypoventilation in conditions classified elsewhere: Secondary | ICD-10-CM

## 2019-02-15 DIAGNOSIS — Q600 Renal agenesis, unilateral: Secondary | ICD-10-CM | POA: Diagnosis not present

## 2019-02-15 DIAGNOSIS — J42 Unspecified chronic bronchitis: Secondary | ICD-10-CM | POA: Diagnosis not present

## 2019-02-15 DIAGNOSIS — Z0000 Encounter for general adult medical examination without abnormal findings: Secondary | ICD-10-CM | POA: Diagnosis not present

## 2019-02-15 DIAGNOSIS — I10 Essential (primary) hypertension: Secondary | ICD-10-CM

## 2019-02-15 MED ORDER — ERGOCALCIFEROL 1.25 MG (50000 UT) PO CAPS: 50000.0000 [IU] | ORAL_CAPSULE | ORAL | 0 refills | Status: DC

## 2019-02-15 NOTE — Progress Notes (Signed)
Telephone note  This visit type was conducted due to national recommendations for restrictions regarding the COVID-19 pandemic (e.g. social distancing).  This format is felt to be most appropriate for this patient at this time.  All issues noted in this document were discussed and addressed.  No physical exam was performed (except for noted visual exam findings with Video Visits).   I connected with@ on 02/15/19 at  2:00 PM EST by telephone and verified that I am speaking with the correct person using two identifiers. Location patient: home Location provider: work or home office Persons participating in the virtual visit: patient, provider  I discussed the limitations, risks, security and privacy concerns of performing an evaluation and management service by telephone and the availability of in person appointments. I also discussed with the patient that there may be a patient responsible charge related to this service. The patient expressed understanding and agreed to proceed.  Reason for visit:  HPI:  58 yr olf female with pulmonary hypertension, chronic hypoxic respiratory failure managed with nocturnal  supplemental oxygen . ,  Hypothyroidism, obesity and hypertension ,  History of mitral valve  Home blood pressures have been measured /  e a week and have been < 130/80and bp today 127/76.   History of mitral valve  repair  : had cardiology follow up;  ECHO repeated and  unchanged Nov 2020   Feeling beter on higher dose of thyroid   Still anxious somewhat secondary to COVID 19 pandemic. Looking forward to seeing her family   Has started a walking program as of last week.  15 minutes thus far.   ROS: See pertinent positives and negatives per HPI.  Past Medical History:  Diagnosis Date  . Anxiety   . Bleeding from the nose   . Chronic diastolic congestive heart failure ([place])    a. 03/2016 Echo: EF 55-60%, Gr2 DD; b. 08/2016 Echo: EF 60-65%, nl diast fxn.  . Congenital absence of one  kidney   . COPD (chronic obstructive pulmonary disease) ([place])    a. 02/2017 PFT: mod/sev obstructive airway dzs w/ significant bronchodilator response.  . Depression    treated at [facility]  . Dyspnea   . Emphysema/COPD ([place])   . GERD (gastroesophageal reflux disease)   . History of cardiac cath    a. 05/2016 Cath: nl cors.  . History of sciatica   . Hypertension   . Hypothyroidism   . Mitral Valve Prolapse & Severe mitral regurgitation s/p MVR    a. 03/2016 Echo: severe MVP involving the posterior leaflet, sev MR;  b. 06/2016 min invasive MV repair w/ triangular resection of flail segment (P2) of posterior leaflet, gore-tex neochord placement x 4, Sorin Memo 3D rin Annulosplasty (69mm, catalog # [id number], ser # [id number]); c. 08/2016 Echo: EF 60-65%, no rwma, triv AI. MV area 2.06 cm^2 (pressure 1/2 time). Nl RV fxn. Mild-mod TR. PASP 65mmHg.  . Motion sickness    cars  . Pneumonia   . PONV (postoperative nausea and vomiting)   . Post-op Afib    a. 06/2016 following MV repair-->short-course amio. Coumadin d/c'd 2/2 epistaxis.  . Pulmonary hypertension ([place])    a. 08/2016 Echo: PASP 40mmHg.  . S/P patent foramen ovale closure    a. 06/2016 @ time of MV Repair.  . Sleep apnea    O2 at night and PRN    Past Surgical History:  Procedure Laterality Date  . ABDOMINAL HYSTERECTOMY    . BREAST BIOPSY Right 2011  [facility]< benign  . CATARACT EXTRACTION W/PHACO Left 11/14/2015   Procedure: CATARACT EXTRACTION PHACO AND INTRAOCULAR LENS PLACEMENT (IOC);  Surgeon: Leandrew Koyanagi, MD;  Location: [place];  Service: Ophthalmology;  Laterality: Left;  sleep apnea Tor[place]  . CATARACT EXTRACTION W/PHACO Right 12/19/2015   Procedure: CATARACT EXTRACTION PHACO AND INTRAOCULAR LENS PLACEMENT (IOC);  Surgeon: Leandrew Koyanagi, MD;  Location: [place];  Service: Ophthalmology;  Laterality: Right;  ANXIETY GENEROUS IV SEDATION TORIC LEN  . COMBINED HYSTERECTOMY ABDOMINAL W/ A&P REPAIR  / OOPHORECTOMY  1996   benign tumor  . INNER EAR SURGERY     bilateral  . MITRAL VALVE REPAIR Right 07/17/2016   Procedure: MINIMALLY INVASIVE MITRAL VALVE REPAIR (MVR) USING 26 SORIN MEMO 3D ANNULOPLASTY RING;  Surgeon: Rexene Alberts, MD;  Location: [place];  Service: Open Heart Surgery;  Laterality: Right;  . PATENT FORAMEN OVALE(PFO) CLOSURE N/A 07/17/2016   Procedure: PATENT FORAMEN OVALE (PFO) CLOSURE;  Surgeon: Rexene Alberts, MD;  Location: [place];  Service: Open Heart Surgery;  Laterality: N/A;  . RIGHT/LEFT HEART CATH AND CORONARY ANGIOGRAPHY Bilateral 06/16/2016   Procedure: Right/Left Heart Cath and Coronary Angiography;  Surgeon: Wellington Hampshire, MD;  Location: [place] [facility];  Service: Cardiovascular;  Laterality: Bilateral;  . TEE WITHOUT CARDIOVERSION N/A 05/19/2016   Procedure: TRANSESOPHAGEAL ECHOCARDIOGRAM (TEE);  Surgeon: Wellington Hampshire, MD;  Location: [facility];  Service: Cardiovascular;  Laterality: N/A;  . TEE WITHOUT CARDIOVERSION N/A 06/09/2016   Procedure: TRANSESOPHAGEAL ECHOCARDIOGRAM (TEE);  Surgeon: Wellington Hampshire, MD;  Location: [facility];  Service: Cardiovascular;  Laterality: N/A;  . TEE WITHOUT CARDIOVERSION N/A 06/16/2016   Procedure: TRANSESOPHAGEAL ECHOCARDIOGRAM (TEE);  Surgeon: Wellington Hampshire, MD;  Location: [facility];  Service: Cardiovascular;  Laterality: N/A;  . TEE WITHOUT CARDIOVERSION N/A 07/17/2016   Procedure: TRANSESOPHAGEAL ECHOCARDIOGRAM (TEE);  Surgeon: Rexene Alberts, MD;  Location: [place];  Service: Open Heart Surgery;  Laterality: N/A;  . TONSILLECTOMY      Family History  Problem Relation Age of Onset  . Multiple sclerosis Mother   . Hypertension Mother   . Coronary artery disease Father   . Heart disease Father   . Hypertension Father   . Heart disease Brother   . Cancer Brother   . Breast cancer Paternal Aunt     SOCIAL HX:  reports that she has never smoked. She has never used smokeless tobacco. She reports that she does not  drink alcohol or use drugs.   Current Outpatient Medications:  .  acetaminophen (TYLENOL) 500 MG tablet, Take 500 mg by mouth 2 (two) times daily as needed for moderate pain or headache., Disp: , Rfl:  .  albuterol (PROVENTIL) (2.5 MG/3ML) 0.083% nebulizer solution, Take 2.5 mg by nebulization every 6 (six) hours as needed for wheezing or shortness of breath., Disp: , Rfl:  .  albuterol (VENTOLIN HFA) 108 (90 Base) MCG/ACT inhaler, INHALE 2 PUFFS INTO THE LUNGS EVERY 6 (SIX) HOURS AS NEEDED FOR WHEEZING OR SHORTNESS OF BREATH.  (USE WITH SPACER), Disp: 18 g, Rfl: 2 .  aspirin EC 81 MG EC tablet, Take 1 tablet (81 mg total) by mouth daily., Disp: , Rfl:  .  ergocalciferol (DRISDOL) 1.25 MG (50000 UT) capsule, Take 1 capsule (50,000 Units total) by mouth once a week., Disp: 12 capsule, Rfl: 0 .  famotidine (PEPCID AC) 10 MG chewable tablet, Chew 10 mg by mouth daily as needed for heartburn., Disp: , Rfl:  .  ferrous sulfate 325 (65 FE) MG tablet, Take 325 mg by mouth daily with breakfast., Disp: , Rfl:  .  fluticasone (FLONASE) 50 MCG/ACT nasal spray, USE 2 SPRAYS IN EACH NOSTRIL EVERY DAY, Disp: 48 g, Rfl: 2 .  furosemide (LASIX) 20 MG tablet, TAKE 1 TABLET EVERY DAY, Disp: 90 tablet, Rfl: 1 .  Hypromellose (ISOPTO TEARS) 0.5 % SOLN, Apply 1 drop to eye 2 (two) times daily., Disp: , Rfl:  .  levothyroxine (SYNTHROID) 137 MCG tablet, Take 1 tablet (137 mcg total) by mouth daily before breakfast., Disp: 90 tablet, Rfl: 1 .  loratadine-pseudoephedrine (CLARITIN-D 24-HOUR) 10-240 MG 24 hr tablet, Take 1 tablet by mouth daily., Disp: , Rfl:  .  losartan (COZAAR) 50 MG tablet, TAKE 1 TABLET EVERY DAY, Disp: 90 tablet, Rfl: 1 .  OXYGEN, Inhale 2 L into the lungs at bedtime as needed (at bedtime and as needed)., Disp: , Rfl:  .  oxymetazoline (AFRIN) 0.05 % nasal spray, Place 1 spray into both nostrils 2 (two) times daily. As needed for nose bleed, Disp: , Rfl:  .  polyethylene glycol powder  (GLYCOLAX/MIRALAX) powder, Take 17 g by mouth 2 (two) times daily as needed. (Patient taking differently: Take 17 g by mouth daily as needed for mild constipation. ), Disp: 3350 g, Rfl: 1 .  traMADol (ULTRAM) 50 MG tablet, Take 1-2 tablets (50-100 mg total) by mouth every 4 (four) hours as needed for moderate pain., Disp: 30 tablet, Rfl: 0 .  umeclidinium-vilanterol (ANORO ELLIPTA) 62.5-25 MCG/INH AEPB, INHALE 1 PUFF INTO THE LUNGS EVERY DAY, Disp: 180 each, Rfl: 11 .  vitamin B-12 (CYANOCOBALAMIN) 1000 MCG tablet, Take 1 tablet (1,000 mcg total) by mouth daily., Disp: 90 tablet, Rfl: 3  EXAM:  General appearance: alert, cooperative and articulate.  No signs of being in distress  Lungs: not short of breath ,  No cough, speaking in full sentences  Psych: affect normal,dspeech is articulate and non pressured .  Denies suicidal thoughts  ASSESSMENT AND PLAN:  Discussed the following assessment and plan:  Hypothyroidism due to acquired atrophy of thyroid - Plan: TSH  Essential hypertension - Plan: Comprehensive metabolic panel  Congenital absence of one kidney  Obesity (BMI 30-39.9)  Chronic bronchitis, unspecified chronic bronchitis type (HCC)  Nocturnal hypoxemia due to obstructive chronic bronchitis ([place])  Hypertension Well controlled on current regimen. Renal function stable, no changes today.  Congenital absence of one kidney Incidental finding  Of solitary right kidney,  Patent renal artery during workup in 2018 for respiratory failure and mitral valve regurgitation Cr has been normal.   Lab Results  Component Value Date   CREATININE 0.99 01/26/2019     Obesity (BMI 30-39.9) Recent weight gain addressed.  Identified areas of diet that need addressing and encouraged to increase her distance walking weekly.   Hypothyroidism Patient's thyroid functionwass underactive on current levothyroxine dose of 125  mcg daily.   Will advise patient to continue 125 mcg daily and take 250  mcg (2 tablets) on Sundays only,  until she uses up the current suppyl.  New rx for 137 mcg  Has been sent to local pharmacy with instructiion not to fill but keep on file for next refill   Lab Results  Component Value Date   TSH 5.82 (H) 01/26/2019     COPD (chronic obstructive pulmonary disease) ([place]) She denies recurrence of  chest tightness   Nocturnal hypoxemia due to obstructive chronic bronchitis (HCC) Continue nocturnal use of supplemental oxygen  at 2 L/min    I discussed the assessment and treatment plan with the patient. The patient was provided an opportunity to ask questions and all were answered. The patient agreed with the plan and demonstrated an understanding of the instructions.   The patient was advised to call back or seek an in-person evaluation if the symptoms worsen or if the condition fails to improve as anticipated.   I provided  22 minutes of non-face-to-face time during this encounter reviewing patient's current problems and post surgeries.  Providing counseling on the above mentioned problems , and coordination  of care .  [name], MD

## 2019-02-15 NOTE — Patient Instructions (Addendum)
  Ms. Brosh , Thank you for taking time to come for your Medicare Wellness Visit. I appreciate your ongoing commitment to your health goals. Please review the following plan we discussed and let me know if I can assist you in the future.   These are the goals we discussed: Goals      Patient Stated   . Increase physical activity (pt-stated)     Walk more for exercise. Lose weight.        This is a list of the screening recommended for you and due dates:  Health Maintenance  Topic Date Due  . Tetanus Vaccine  11/13/2019  . Cologuard (Stool DNA test)  11/18/2019  . Mammogram  11/09/2020  . Flu Shot  Completed  .  Hepatitis C: One time screening is recommended by Center for Disease Control  (CDC) for  adults born from [birth year] through 1965.   Completed  . HIV Screening  Completed  . Pap Smear  Discontinued

## 2019-02-15 NOTE — Progress Notes (Addendum)
Subjective:   Lauren Bartlett is a 63 y.o. female who presents for Medicare Annual (Subsequent) preventive examination.  Review of Systems:  No ROS.  Medicare Wellness Virtual Visit.  Visual/audio telehealth visit, UTA vital signs.   See social history for additional risk factors.   Cardiac Risk Factors include: hypertension     Objective:     Vitals: There were no vitals taken for this visit.  There is no height or weight on file to calculate BMI.  Advanced Directives 02/15/2019 04/28/2018 02/11/2018 02/10/2017 01/01/2017 10/20/2016 10/10/2016  Does Patient Have a Medical Advance Directive? No No No No No No No  Would patient like information on creating a medical advance directive? No - Patient declined - No - Patient declined No - Patient declined - - No - Patient declined    Tobacco Social History   Tobacco Use  Smoking Status Never Smoker  Smokeless Tobacco Never Used     Counseling given: Not Answered   Clinical Intake:  Pre-visit preparation completed: Yes        Diabetes: No  How often do you need to have someone help you when you read instructions, pamphlets, or other written materials from your doctor or pharmacy?: 1 - Never  Interpreter Needed?: No     Past Medical History:  Diagnosis Date  . Anxiety   . Bleeding from the nose   . Chronic diastolic congestive heart failure ([place])    a. 03/2016 Echo: EF 55-60%, Gr2 DD; b. 08/2016 Echo: EF 60-65%, nl diast fxn.  . Congenital absence of one kidney   . COPD (chronic obstructive pulmonary disease) ([place])    a. 02/2017 PFT: mod/sev obstructive airway dzs w/ significant bronchodilator response.  . Depression    treated at [facility]  . Dyspnea   . Emphysema/COPD ([place])   . GERD (gastroesophageal reflux disease)   . History of cardiac cath    a. 05/2016 Cath: nl cors.  . History of sciatica   . Hypertension   . Hypothyroidism   . Mitral Valve Prolapse & Severe mitral regurgitation s/p MVR    a. 03/2016  Echo: severe MVP involving the posterior leaflet, sev MR;  b. 06/2016 min invasive MV repair w/ triangular resection of flail segment (P2) of posterior leaflet, gore-tex neochord placement x 4, Sorin Memo 3D rin Annulosplasty (55mm, catalog # [id number], ser # [id number]); c. 08/2016 Echo: EF 60-65%, no rwma, triv AI. MV area 2.06 cm^2 (pressure 1/2 time). Nl RV fxn. Mild-mod TR. PASP 71mmHg.  . Motion sickness    cars  . Pneumonia   . PONV (postoperative nausea and vomiting)   . Post-op Afib    a. 06/2016 following MV repair-->short-course amio. Coumadin d/c'd 2/2 epistaxis.  . Pulmonary hypertension ([place])    a. 08/2016 Echo: PASP 35mmHg.  . S/P patent foramen ovale closure    a. 06/2016 @ time of MV Repair.  . Sleep apnea    O2 at night and PRN   Past Surgical History:  Procedure Laterality Date  . ABDOMINAL HYSTERECTOMY    . BREAST BIOPSY Right 2011   [facility]< benign  . CATARACT EXTRACTION W/PHACO Left 11/14/2015   Procedure: CATARACT EXTRACTION PHACO AND INTRAOCULAR LENS PLACEMENT (IOC);  Surgeon: Leandrew Koyanagi, MD;  Location: [place];  Service: Ophthalmology;  Laterality: Left;  sleep apnea [place]  . CATARACT EXTRACTION W/PHACO Right 12/19/2015   Procedure: CATARACT EXTRACTION PHACO AND INTRAOCULAR LENS PLACEMENT (IOC);  Surgeon: Leandrew Koyanagi, MD;  [facility];  Service: Ophthalmology;  Laterality: Right;  ANXIETY GENEROUS IV SEDATION TORIC LEN  . COMBINED HYSTERECTOMY ABDOMINAL W/ A&P REPAIR / OOPHORECTOMY  1996   benign tumor  . INNER EAR SURGERY     bilateral  . MITRAL VALVE REPAIR Right 07/17/2016   Procedure: MINIMALLY INVASIVE MITRAL VALVE REPAIR (MVR) USING 26 SORIN MEMO 3D ANNULOPLASTY RING;  Surgeon: Rexene Alberts, MD;  Location: [place];  Service: Open Heart Surgery;  Laterality: Right;  . PATENT FORAMEN OVALE(PFO) CLOSURE N/A 07/17/2016   Procedure: PATENT FORAMEN OVALE (PFO) CLOSURE;  Surgeon: Rexene Alberts, MD;  Location: [place];  Service: Open  Heart Surgery;  Laterality: N/A;  . RIGHT/LEFT HEART CATH AND CORONARY ANGIOGRAPHY Bilateral 06/16/2016   Procedure: Right/Left Heart Cath and Coronary Angiography;  Surgeon: Wellington Hampshire, MD;  Location: [place] [facility];  Service: Cardiovascular;  Laterality: Bilateral;  . TEE WITHOUT CARDIOVERSION N/A 05/19/2016   Procedure: TRANSESOPHAGEAL ECHOCARDIOGRAM (TEE);  Surgeon: Wellington Hampshire, MD;  Location: [facility];  Service: Cardiovascular;  Laterality: N/A;  . TEE WITHOUT CARDIOVERSION N/A 06/09/2016   Procedure: TRANSESOPHAGEAL ECHOCARDIOGRAM (TEE);  Surgeon: Wellington Hampshire, MD;  Location: [facility];  Service: Cardiovascular;  Laterality: N/A;  . TEE WITHOUT CARDIOVERSION N/A 06/16/2016   Procedure: TRANSESOPHAGEAL ECHOCARDIOGRAM (TEE);  Surgeon: Wellington Hampshire, MD;  Location: [facility];  Service: Cardiovascular;  Laterality: N/A;  . TEE WITHOUT CARDIOVERSION N/A 07/17/2016   Procedure: TRANSESOPHAGEAL ECHOCARDIOGRAM (TEE);  Surgeon: Rexene Alberts, MD;  Location: [place];  Service: Open Heart Surgery;  Laterality: N/A;  . TONSILLECTOMY     Family History  Problem Relation Age of Onset  . Multiple sclerosis Mother   . Hypertension Mother   . Coronary artery disease Father   . Heart disease Father   . Hypertension Father   . Heart disease Brother   . Cancer Brother   . Breast cancer Paternal Aunt    Social History   Socioeconomic History  . Marital status: Single    Spouse name: Not on file  . Number of children: Not on file  . Years of education: Not on file  . Highest education level: Not on file  Occupational History  . Occupation: cna    Comment: part time  Social Needs  . Financial resource strain: Not very hard  . Food insecurity    Worry: Never true    Inability: Never true  . Transportation needs    Medical: No    Non-medical: No  Tobacco Use  . Smoking status: Never Smoker  . Smokeless tobacco: Never Used  Substance and Sexual Activity  . Alcohol use: No   . Drug use: No  . Sexual activity: Not Currently  Lifestyle  . Physical activity    Days per week: 7 days    Minutes per session: 30 min  . Stress: Only a little  Relationships  . Social Herbalist on phone: Not on file    Gets together: Not on file    Attends religious service: Not on file    Active member of club or organization: Not on file    Attends meetings of clubs or organizations: Not on file    Relationship status: Not on file  Other Topics Concern  . Not on file  Social History Narrative   Lives in a group home for adults          Outpatient Encounter Medications as of 02/15/2019  Medication Sig  . acetaminophen (TYLENOL) 500 MG tablet Take 500 mg by mouth 2 (two) times daily as needed for moderate pain or headache.  . albuterol (PROVENTIL) (2.5 MG/3ML) 0.083% nebulizer solution Take 2.5 mg by nebulization every 6 (six) hours as needed for wheezing or shortness of breath.  [name] albuterol (VENTOLIN HFA) 108 (90 Base) MCG/ACT inhaler INHALE 2 PUFFS INTO THE LUNGS EVERY 6 (SIX) HOURS AS NEEDED FOR WHEEZING OR SHORTNESS OF BREATH.  (USE WITH SPACER)  . aspirin EC 81 MG EC tablet Take 1 tablet (81 mg total) by mouth daily.  . famotidine (PEPCID AC) 10 MG chewable tablet Chew 10 mg by mouth daily as needed for heartburn.  . ferrous sulfate 325 (65 FE) MG tablet Take 325 mg by mouth daily with breakfast.  . fluticasone (FLONASE) 50 MCG/ACT nasal spray USE 2 SPRAYS IN EACH NOSTRIL EVERY DAY  . furosemide (LASIX) 20 MG tablet TAKE 1 TABLET EVERY DAY  . Hypromellose (ISOPTO TEARS) 0.5 % SOLN Apply 1 drop to eye 2 (two) times daily.  [name] levothyroxine (SYNTHROID) 137 MCG tablet Take 1 tablet (137 mcg total) by mouth daily before breakfast.  . loratadine-pseudoephedrine (CLARITIN-D 24-HOUR) 10-240 MG 24 hr tablet Take 1 tablet by mouth daily.  [name] losartan (COZAAR) 50 MG tablet TAKE 1 TABLET EVERY DAY  . OXYGEN Inhale 2 L into the lungs at bedtime as needed (at bedtime and as  needed).  [name] oxymetazoline (AFRIN) 0.05 % nasal spray Place 1 spray into both nostrils 2 (two) times daily. As needed for nose bleed  . polyethylene glycol powder (GLYCOLAX/MIRALAX) powder Take 17 g by mouth 2 (two) times daily as needed. (Patient taking differently: Take 17 g by mouth daily as needed for mild constipation. )  . traMADol (ULTRAM) 50 MG tablet Take 1-2 tablets (50-100 mg total) by mouth every 4 (four) hours as needed for moderate pain.  [name] umeclidinium-vilanterol (ANORO ELLIPTA) 62.5-25 MCG/INH AEPB INHALE 1 PUFF INTO THE LUNGS EVERY DAY  . vitamin B-12 (CYANOCOBALAMIN) 1000 MCG tablet Take 1 tablet (1,000 mcg total) by mouth daily.  . ergocalciferol (DRISDOL) 1.25 MG (50000 UT) capsule Take 1 capsule (50,000 Units total) by mouth once a week.   No facility-administered encounter medications on file as of 02/15/2019.     Activities of Daily Living In your present state of health, do you have any difficulty performing the following activities: 02/15/2019  Hearing? Y  Vision? N  Difficulty concentrating or making decisions? N  Walking or climbing stairs? Y  Comment Unsteady gait. Cane/walker in use.  Dressing or bathing? Y  Doing errands, shopping? N  Preparing Food and eating ? Y  Comment She does not cook. Heats meals in the microwave.  Using the Toilet? N  In the past six months, have you accidently leaked urine? Y  Comment Wears depends  Do you have problems with loss of bowel control? N  Managing your Medications? N  Managing your Finances? N  Housekeeping or managing your Housekeeping? Y  Some recent data might be hidden    Patient Care Team: [name], MD as PCP - General (Internal Medicine) Wellington Hampshire, MD as PCP - Cardiology (Cardiology)    Assessment:   This is a routine wellness examination for Lauren Bartlett.  Nurse connected with patient 02/15/19 at 12:00 PM EST by a telephone enabled telemedicine application and verified that I am speaking with the  correct person using two identifiers. Patient stated full name and DOB. Patient  gave permission to continue with virtual visit. Patient's location was at home and Nurse's location was at [place] office.   Health Maintenance Due: Update all pending maintenance due as appropriate.   See completed HM at the end of note.   Eye: Visual acuity not assessed. Virtual visit.   Dental: UTD  Hearing: Hearing aids- yes  Safety:  Patient feels safe at home- yes Patient does have smoke detectors at home- yes Patient does wear sunscreen or protective clothing when in direct sunlight - yes Patient does wear seat belt when in a moving vehicle - yes Patient drives- yes; limited Adequate lighting in walkways free from debris- yes Grab bars and handrails used as appropriate- yes Ambulates with a cane or walker as an assistive device Cell phone on person when ambulating outside of the home- yes  Social: Alcohol intake - no    Smoking history- never   Smokers in home? none Illicit drug use? none  Depression: PHQ 2 &9 complete. See screening below. Denies irritability, anhedonia, sadness/tearfullness.  Stable.   Falls: See screening below.    Medication: Taking as directed and without issues. Pill box in use.  Wears oxygen 2L at bedtime.   Covid-19: Precautions and sickness symptoms discussed. Wears mask, social distancing, hand hygiene as appropriate.   Activities of Daily Living Patient denies needing assistance with: household chores, feeding themselves, getting from bed to chair, getting to the toilet, managing money.   Assisted by aide with house keeping, bathing/showering, dressing as needed.   Meals prepared by [employer] M-F.   Memory: Patient is alert. Patient denies difficulty focusing or concentrating. Correctly identified the president of the [place], season and recall.  Patient likes to read, play games on her cell phone, complete puzzles for brain stimulation.   BMI-  discussed the importance of a healthy diet, water intake and the benefits of aerobic exercise.  Educational material provided.  Physical activity- walking daily about 20 minutes  Diet:  Regular Water: good intake  Other Providers Patient Care Team: [name], MD as PCP - General (Internal Medicine) Wellington Hampshire, MD as PCP - Cardiology (Cardiology)  Exercise Activities and Dietary recommendations Current Exercise Habits: Home exercise routine, Type of exercise: walking, Time (Minutes): 20, Frequency (Times/Week): 5, Weekly Exercise (Minutes/Week): 100, Intensity: Mild  Goals      Patient Stated   . Increase physical activity (pt-stated)     Walk more for exercise. Lose weight.        Fall Risk Fall Risk  02/15/2019 01/26/2019 12/15/2018 02/11/2018 02/10/2017  Falls in the past year? 0 0 0 0 No  Comment - - - - -  Number falls in past yr: 1 - - - -  Risk for fall due to : - Impaired balance/gait Medication side effect - -  Follow up Falls prevention discussed Falls evaluation completed Falls evaluation completed - -  Timed Get Up and Go performed: no, virtual visit  Depression Screen PHQ 2/9 Scores 02/15/2019 02/11/2018 02/10/2017 11/06/2016  PHQ - 2 Score 0 0 1 0  PHQ- 9 Score - - 3 -     Cognitive Function MMSE - Mini Mental State Exam 02/10/2017 02/11/2016  Orientation to time 5 5  Orientation to Place 5 5  Registration 3 3  Attention/ Calculation 5 5  Recall 3 3  Language- name 2 objects 2 2  Language- repeat 1 1  Language- follow 3 step command 3 3  Language- read & follow direction  1 1  Write a sentence 1 1  Copy design 1 1  Total score 30 30     6CIT Screen 02/15/2019 02/11/2018 02/11/2016  What Year? 0 points 0 points 0 points  What month? 0 points 0 points 0 points  What time? 0 points 0 points 0 points  Count back from 20 0 points 0 points 0 points  Months in reverse 0 points 0 points 0 points  Repeat phrase 0 points - -  Total Score 0 -  -    Immunization History  Administered Date(s) Administered  . Influenza Split 01/02/2012  . Influenza,inj,Quad PF,6+ Mos 03/13/2014, 01/08/2015, 02/03/2018, 01/26/2019  . Influenza-Unspecified 01/11/2013, 12/30/2014  . Pneumococcal Conjugate-13 02/27/2015  . Pneumococcal Polysaccharide-23 03/07/2013  . Tdap 11/12/2009   Screening Tests Health Maintenance  Topic Date Due  . TETANUS/TDAP  11/13/2019  . Fecal DNA (Cologuard)  11/18/2019  . MAMMOGRAM  11/09/2020  . INFLUENZA VACCINE  Completed  . Hepatitis C Screening  Completed  . HIV Screening  Completed  . PAP SMEAR-Modifier  Discontinued      Plan:   Keep all routine maintenance appointments.   Follow up today @ 2:00  Medicare Attestation I have personally reviewed: The patient's medical and social history Their use of alcohol, tobacco or illicit drugs Their current medications and supplements The patient's functional ability including ADLs,fall risks, home safety risks, cognitive, and hearing and visual impairment Diet and physical activities Evidence for depression   In addition, I have reviewed and discussed with patient certain preventive protocols, quality metrics, and best practice recommendations. A written personalized care plan for preventive services as well as general preventive health recommendations were provided to patient via mail.     OBrien-Blaney, Alveria Mcglaughlin L, LPN  [phone]     I have reviewed the above information and agree with above.   Deborra Medina, MD

## 2019-02-15 NOTE — Telephone Encounter (Signed)
-----   Message from Theora Gianotti, NP sent at 02/14/2019  3:43 PM EST ----- Nl heart squeezing fxn. Mildly stenotic (tight) mitral valve (previously repaired), which is similar to what was seen on echo in 08/2016.  Right sided heart pressure ~ 45.3 mmHg, which is similar, though slightly less than what we saw in 2018.  Overall, stable findings on this study.

## 2019-02-15 NOTE — Telephone Encounter (Signed)
Call to patient to review Echo results.   No questions or further orders at this time.   Advised pt to call for any further questions or concerns.

## 2019-02-16 NOTE — Assessment & Plan Note (Signed)
Patient's thyroid functionwass underactive on current levothyroxine dose of 125  mcg daily.   Will advise patient to continue 125 mcg daily and take 250 mcg (2 tablets) on Sundays only,  until she uses up the current suppyl.  New rx for 137 mcg  Has been sent to local pharmacy with instructiion not to fill but keep on file for next refill   Lab Results  Component Value Date   TSH 5.82 (H) 01/26/2019

## 2019-02-16 NOTE — Assessment & Plan Note (Signed)
Incidental finding  Of solitary right kidney,  Patent renal artery during workup in 2018 for respiratory failure and mitral valve regurgitation Cr has been normal.   Lab Results  Component Value Date   CREATININE 0.99 01/26/2019

## 2019-02-16 NOTE — Assessment & Plan Note (Signed)
She denies recurrence of  chest tightness

## 2019-02-16 NOTE — Assessment & Plan Note (Signed)
Continue nocturnal use of supplemental oxygen at 2 L/min 

## 2019-02-16 NOTE — Assessment & Plan Note (Signed)
Well controlled on current regimen. Renal function stable, no changes today. 

## 2019-02-16 NOTE — Assessment & Plan Note (Signed)
Recent weight gain addressed.  Identified areas of diet that need addressing and encouraged to increase her distance walking weekly.

## 2019-03-08 DIAGNOSIS — J449 Chronic obstructive pulmonary disease, unspecified: Secondary | ICD-10-CM | POA: Diagnosis not present

## 2019-03-11 ENCOUNTER — Telehealth: Payer: Self-pay | Admitting: Cardiovascular Disease

## 2019-03-11 NOTE — Telephone Encounter (Signed)

## 2019-03-17 ENCOUNTER — Telehealth: Payer: Medicare HMO | Admitting: Cardiovascular Disease

## 2019-04-07 ENCOUNTER — Ambulatory Visit: Payer: Medicare HMO

## 2019-04-07 ENCOUNTER — Other Ambulatory Visit: Payer: Medicare HMO

## 2019-04-08 ENCOUNTER — Other Ambulatory Visit: Payer: Medicare HMO

## 2019-04-08 DIAGNOSIS — J449 Chronic obstructive pulmonary disease, unspecified: Secondary | ICD-10-CM | POA: Diagnosis not present

## 2019-04-12 ENCOUNTER — Ambulatory Visit: Payer: Medicare HMO

## 2019-04-13 ENCOUNTER — Other Ambulatory Visit: Payer: Self-pay

## 2019-04-13 ENCOUNTER — Ambulatory Visit (INDEPENDENT_AMBULATORY_CARE_PROVIDER_SITE_OTHER): Payer: Medicare HMO

## 2019-04-13 ENCOUNTER — Other Ambulatory Visit (INDEPENDENT_AMBULATORY_CARE_PROVIDER_SITE_OTHER): Payer: Medicare HMO

## 2019-04-13 DIAGNOSIS — I10 Essential (primary) hypertension: Secondary | ICD-10-CM | POA: Diagnosis not present

## 2019-04-13 DIAGNOSIS — E034 Atrophy of thyroid (acquired): Secondary | ICD-10-CM

## 2019-04-13 DIAGNOSIS — Z23 Encounter for immunization: Secondary | ICD-10-CM | POA: Diagnosis not present

## 2019-04-14 ENCOUNTER — Other Ambulatory Visit: Payer: Self-pay | Admitting: Internal Medicine

## 2019-04-14 ENCOUNTER — Other Ambulatory Visit: Payer: Self-pay

## 2019-04-14 DIAGNOSIS — E034 Atrophy of thyroid (acquired): Secondary | ICD-10-CM

## 2019-04-14 LAB — COMPREHENSIVE METABOLIC PANEL
ALT: 20 U/L (ref 0–35)
AST: 23 U/L (ref 0–37)
Albumin: 4.4 g/dL (ref 3.5–5.2)
Alkaline Phosphatase: 75 U/L (ref 39–117)
BUN: 19 mg/dL (ref 6–23)
CO2: 29 mEq/L (ref 19–32)
Calcium: 8.3 mg/dL — ABNORMAL LOW (ref 8.4–10.5)
Chloride: 99 mEq/L (ref 96–112)
Creatinine, Ser: 1 mg/dL (ref 0.40–1.20)
GFR: 55.93 mL/min — ABNORMAL LOW (ref >60.00)
Glucose, Bld: 88 mg/dL (ref 70–99)
Potassium: 4.2 mEq/L (ref 3.5–5.1)
Sodium: 139 mEq/L (ref 135–145)
Total Bilirubin: 0.5 mg/dL (ref 0.2–1.2)
Total Protein: 7.7 g/dL (ref 6.0–8.3)

## 2019-04-14 LAB — TSH: TSH: 6.91 u[IU]/mL — ABNORMAL HIGH (ref 0.35–4.50)

## 2019-04-14 MED ORDER — LEVOTHYROXINE SODIUM 150 MCG PO TABS: 150.0000 ug | ORAL_TABLET | Freq: Every day | ORAL | 0 refills | Status: DC

## 2019-04-21 ENCOUNTER — Telehealth: Payer: Self-pay | Admitting: Internal Medicine

## 2019-04-21 NOTE — Telephone Encounter (Signed)
Pt stated that [facility] called her and said that they cant fill her rx for levothyroxine (SYNTHROID) 150 MCG tablet till March.. Do to the new dosage

## 2019-04-21 NOTE — Telephone Encounter (Signed)
I have called [facility] to find out why patient could not have synthroid 150 mcg filled. They stated that it appeared it was too soon to fill & may have been filled bt retail pharmacy. After reviewing records I saw that it had been filled at [facility] then canceled to send to [facility]. However [facility] was not called to cancel. I cancelled script with [facility] & called back [facility] to have them resubmit script. The could nit yet see where I had cancelled with [facility]. Pharm tech stated that it may take up to a hour to show. She said that she would try to run again by then end of her shift.  I will check on this tomorrow with [facility].  I also called to inform patient of what has transpired.

## 2019-04-28 ENCOUNTER — Other Ambulatory Visit: Payer: Self-pay | Admitting: Internal Medicine

## 2019-04-28 NOTE — Telephone Encounter (Signed)
Pt needs refill on levothyroxine (SYNTHROID) 150 MCG tablet sent to Mail order[facility]. [facility] states that it is on hold and pt is about out of this medication. Pt would also like a call back.

## 2019-04-29 NOTE — Telephone Encounter (Signed)
Refill en route to patient.

## 2019-04-29 NOTE — Telephone Encounter (Signed)
Tried to reach patient by phone no answer left voicemail advising patient I called pharmacy and medication has shipped.

## 2019-05-09 DIAGNOSIS — J449 Chronic obstructive pulmonary disease, unspecified: Secondary | ICD-10-CM | POA: Diagnosis not present

## 2019-05-20 ENCOUNTER — Other Ambulatory Visit: Payer: Self-pay | Admitting: Internal Medicine

## 2019-05-21 ENCOUNTER — Encounter: Payer: Self-pay | Admitting: Emergency Medicine

## 2019-05-21 ENCOUNTER — Ambulatory Visit
Admission: EM | Admit: 2019-05-21 | Discharge: 2019-05-21 | Disposition: A | Discharge status: Home / Self Care | Payer: Medicare HMO | Attending: Family Medicine | Admitting: Family Medicine

## 2019-05-21 ENCOUNTER — Other Ambulatory Visit: Payer: Self-pay

## 2019-05-21 DIAGNOSIS — Z9842 Cataract extraction status, left eye: Secondary | ICD-10-CM | POA: Diagnosis not present

## 2019-05-21 DIAGNOSIS — Z888 Allergy status to other drugs, medicaments and biological substances status: Secondary | ICD-10-CM | POA: Diagnosis not present

## 2019-05-21 DIAGNOSIS — Z79890 Hormone replacement therapy (postmenopausal): Secondary | ICD-10-CM | POA: Diagnosis not present

## 2019-05-21 DIAGNOSIS — Z885 Allergy status to narcotic agent status: Secondary | ICD-10-CM | POA: Insufficient documentation

## 2019-05-21 DIAGNOSIS — E039 Hypothyroidism, unspecified: Secondary | ICD-10-CM | POA: Insufficient documentation

## 2019-05-21 DIAGNOSIS — E559 Vitamin D deficiency, unspecified: Secondary | ICD-10-CM | POA: Insufficient documentation

## 2019-05-21 DIAGNOSIS — Z8249 Family history of ischemic heart disease and other diseases of the circulatory system: Secondary | ICD-10-CM | POA: Diagnosis not present

## 2019-05-21 DIAGNOSIS — Z961 Presence of intraocular lens: Secondary | ICD-10-CM | POA: Diagnosis not present

## 2019-05-21 DIAGNOSIS — Z881 Allergy status to other antibiotic agents status: Secondary | ICD-10-CM | POA: Diagnosis not present

## 2019-05-21 DIAGNOSIS — Z20822 Contact with and (suspected) exposure to covid-19: Secondary | ICD-10-CM | POA: Diagnosis not present

## 2019-05-21 DIAGNOSIS — I110 Hypertensive heart disease with heart failure: Secondary | ICD-10-CM | POA: Insufficient documentation

## 2019-05-21 DIAGNOSIS — Q600 Renal agenesis, unilateral: Secondary | ICD-10-CM | POA: Diagnosis not present

## 2019-05-21 DIAGNOSIS — Z79899 Other long term (current) drug therapy: Secondary | ICD-10-CM | POA: Insufficient documentation

## 2019-05-21 DIAGNOSIS — I5032 Chronic diastolic (congestive) heart failure: Secondary | ICD-10-CM | POA: Insufficient documentation

## 2019-05-21 DIAGNOSIS — I272 Pulmonary hypertension, unspecified: Secondary | ICD-10-CM | POA: Insufficient documentation

## 2019-05-21 DIAGNOSIS — Z8774 Personal history of (corrected) congenital malformations of heart and circulatory system: Secondary | ICD-10-CM | POA: Insufficient documentation

## 2019-05-21 DIAGNOSIS — Z820 Family history of epilepsy and other diseases of the nervous system: Secondary | ICD-10-CM | POA: Diagnosis not present

## 2019-05-21 DIAGNOSIS — J441 Chronic obstructive pulmonary disease with (acute) exacerbation: Secondary | ICD-10-CM | POA: Diagnosis not present

## 2019-05-21 DIAGNOSIS — Z7982 Long term (current) use of aspirin: Secondary | ICD-10-CM | POA: Diagnosis not present

## 2019-05-21 DIAGNOSIS — Z9841 Cataract extraction status, right eye: Secondary | ICD-10-CM | POA: Insufficient documentation

## 2019-05-21 DIAGNOSIS — Z880 Allergy status to penicillin: Secondary | ICD-10-CM | POA: Diagnosis not present

## 2019-05-21 DIAGNOSIS — R0602 Shortness of breath: Secondary | ICD-10-CM | POA: Diagnosis present

## 2019-05-21 MED ORDER — DOXYCYCLINE HYCLATE 100 MG PO TABS: 100.0000 mg | ORAL_TABLET | Freq: Two times a day (BID) | ORAL | 0 refills | Status: DC

## 2019-05-21 MED ORDER — ALBUTEROL SULFATE (2.5 MG/3ML) 0.083% IN NEBU: 2.5000 mg | INHALATION_SOLUTION | Freq: Four times a day (QID) | RESPIRATORY_TRACT | 0 refills | Status: DC | PRN

## 2019-05-21 MED ORDER — METHYLPREDNISOLONE SODIUM SUCC 125 MG IJ SOLR
125.0000 mg | Freq: Once | INTRAMUSCULAR | Status: AC
  Administered 2019-05-21: 14:00:00 125 mg via INTRAMUSCULAR

## 2019-05-21 NOTE — ED Provider Notes (Signed)
[facility]    CSN: [id number] Arrival date & time: 05/21/19  1259      History   Chief Complaint Chief Complaint  Patient presents with  . Shortness of Breath  . Cough    HPI Lauren Bartlett is a 64 y.o. female.   64 yo female with a c/o cough, shortness of breath and increased wheezing for the past 3-4 days. States sputum production has changed. Patient has COPD and uses home O2 at night. Denies any fevers, chills, chest pains. States she's out of her nebulizer medication.   Shortness of Breath Associated symptoms: cough   Cough Associated symptoms: shortness of breath     Past Medical History:  Diagnosis Date  . Anxiety   . Bleeding from the nose   . Chronic diastolic congestive heart failure ([place])    a. 03/2016 Echo: EF 55-60%, Gr2 DD; b. 08/2016 Echo: EF 60-65%, nl diast fxn.  . Congenital absence of one kidney   . COPD (chronic obstructive pulmonary disease) ([place])    a. 02/2017 PFT: mod/sev obstructive airway dzs w/ significant bronchodilator response.  . Depression    treated at [facility]  . Dyspnea   . Emphysema/COPD ([place])   . GERD (gastroesophageal reflux disease)   . History of cardiac cath    a. 05/2016 Cath: nl cors.  . History of sciatica   . Hypertension   . Hypothyroidism   . Mitral Valve Prolapse & Severe mitral regurgitation s/p MVR    a. 03/2016 Echo: severe MVP involving the posterior leaflet, sev MR;  b. 06/2016 min invasive MV repair w/ triangular resection of flail segment (P2) of posterior leaflet, gore-tex neochord placement x 4, Sorin Memo 3D rin Annulosplasty (80mm, catalog # [id number], ser # [id number]); c. 08/2016 Echo: EF 60-65%, no rwma, triv AI. MV area 2.06 cm^2 (pressure 1/2 time). Nl RV fxn. Mild-mod TR. PASP 18mmHg.  . Motion sickness    cars  . Pneumonia   . PONV (postoperative nausea and vomiting)   . Post-op Afib    a. 06/2016 following MV repair-->short-course amio. Coumadin d/c'd 2/2 epistaxis.  . Pulmonary hypertension  ([place])    a. 08/2016 Echo: PASP 47mmHg.  . S/P patent foramen ovale closure    a. 06/2016 @ time of MV Repair.  . Sleep apnea    O2 at night and PRN    Patient Active Problem List   Diagnosis Date Noted  . Dissection of thoracic aorta ([place]) 12/18/2018  . PAF (paroxysmal atrial fibrillation) ([place]) 12/18/2018  . Nocturnal hypoxemia due to obstructive chronic bronchitis ([place]) 02/06/2018  . Grief reaction with prolonged bereavement 08/01/2017  . Encounter for therapeutic drug monitoring 08/13/2016  . S/P minimally invasive mitral valve repair 07/17/2016  . S/P patent foramen ovale closure 07/17/2016  . Chronic diastolic congestive heart failure ([place])   . COPD (chronic obstructive pulmonary disease) ([place]) 05/19/2016  . 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  . Vitamin D deficiency 01/11/2015  . Allergic rhinitis 07/09/2014  . S/P hysterectomy with oophorectomy 09/22/2013  . Pulmonary hypertension ([place])   . Hypocalcemia 09/10/2012  . Hypothyroidism 05/04/2012  . Obesity (BMI 30-39.9) 08/04/2011  . Hypertension   . Congenital absence of one kidney   . History of sciatica     Past Surgical History:  Procedure Laterality Date  . ABDOMINAL HYSTERECTOMY    . BREAST BIOPSY Right 2011   [facility]< benign  . CATARACT EXTRACTION W/PHACO Left 11/14/2015   Procedure: CATARACT EXTRACTION PHACO AND INTRAOCULAR LENS PLACEMENT (  [place]);  Surgeon: Leandrew Koyanagi, MD;  Location: [facility];  Service: Ophthalmology;  Laterality: Left;  sleep apnea [place]  . CATARACT EXTRACTION W/PHACO Right 12/19/2015   Procedure: CATARACT EXTRACTION PHACO AND INTRAOCULAR LENS PLACEMENT ([facility]);  Surgeon: Leandrew Koyanagi, MD;  Location: [facility];  Service: Ophthalmology;  Laterality: Right;  ANXIETY GENEROUS IV SEDATION TORIC LEN  . COMBINED HYSTERECTOMY ABDOMINAL W/ A&P REPAIR / OOPHORECTOMY  1996   benign tumor  . INNER EAR SURGERY     bilateral  . MITRAL VALVE REPAIR Right 07/17/2016   Procedure: MINIMALLY  INVASIVE MITRAL VALVE REPAIR (MVR) USING 26 SORIN MEMO 3D ANNULOPLASTY RING;  Surgeon: Rexene Alberts, MD;  Location: [facility];  Service: Open Heart Surgery;  Laterality: Right;  . PATENT FORAMEN OVALE(PFO) CLOSURE N/A 07/17/2016   Procedure: PATENT FORAMEN OVALE (PFO) CLOSURE;  Surgeon: Rexene Alberts, MD;  Location: [facility];  Service: Open Heart Surgery;  Laterality: N/A;  . RIGHT/LEFT HEART CATH AND CORONARY ANGIOGRAPHY Bilateral 06/16/2016   Procedure: Right/Left Heart Cath and Coronary Angiography;  Surgeon: Wellington Hampshire, MD;  Location: [facility];  Service: Cardiovascular;  Laterality: Bilateral;  . TEE WITHOUT CARDIOVERSION N/A 05/19/2016   Procedure: TRANSESOPHAGEAL ECHOCARDIOGRAM (TEE);  Surgeon: Wellington Hampshire, MD;  Location: [facility];  Service: Cardiovascular;  Laterality: N/A;  . TEE WITHOUT CARDIOVERSION N/A 06/09/2016   Procedure: TRANSESOPHAGEAL ECHOCARDIOGRAM (TEE);  Surgeon: Wellington Hampshire, MD;  Location: [facility];  Service: Cardiovascular;  Laterality: N/A;  . TEE WITHOUT CARDIOVERSION N/A 06/16/2016   Procedure: TRANSESOPHAGEAL ECHOCARDIOGRAM (TEE);  Surgeon: Wellington Hampshire, MD;  Location: [facility];  Service: Cardiovascular;  Laterality: N/A;  . TEE WITHOUT CARDIOVERSION N/A 07/17/2016   Procedure: TRANSESOPHAGEAL ECHOCARDIOGRAM (TEE);  Surgeon: Rexene Alberts, MD;  Location: [facility];  Service: Open Heart Surgery;  Laterality: N/A;  . TONSILLECTOMY      OB History   No obstetric history on file.      Home Medications    Prior to Admission medications   Medication Sig Start Date End Date Taking? Authorizing Provider  ANORO ELLIPTA 62.5-25 MCG/INH AEPB INHALE 1 PUFF INTO THE LUNGS EVERY DAY 05/20/19  Yes [name], MD  aspirin EC 81 MG EC tablet Take 1 tablet (81 mg total) by mouth daily. 07/23/16  Yes Barrett, Erin R, PA-C  ergocalciferol (DRISDOL) 1.25 MG (50000 UT) capsule Take 1 capsule (50,000 Units total) by mouth once a week. 02/15/19  Yes [name], MD  famotidine (PEPCID AC) 10 MG chewable tablet Chew 10 mg by mouth daily as needed for heartburn.   Yes [provider]  ferrous sulfate 325 (65 FE) MG tablet Take 325 mg by mouth daily with breakfast.   Yes [provider]  fluticasone (FLONASE) 50 MCG/ACT nasal spray USE 2 SPRAYS IN EACH NOSTRIL EVERY DAY 01/10/19  Yes [name], MD  furosemide (LASIX) 20 MG tablet TAKE 1 TABLET EVERY DAY 01/10/19  Yes [name], MD  levothyroxine (SYNTHROID) 150 MCG tablet Take 1 tablet (150 mcg total) by mouth daily before breakfast. 04/14/19  Yes [name], MD  losartan (COZAAR) 50 MG tablet TAKE 1 TABLET EVERY DAY 01/14/19  Yes [name], MD  OXYGEN Inhale 2 L into the lungs at bedtime as needed (at bedtime and as needed).   Yes [provider]  vitamin B-12 (CYANOCOBALAMIN) 1000 MCG tablet Take 1 tablet (1,000 mcg total) by mouth daily. 08/14/16  Yes [name], MD  acetaminophen (TYLENOL) 500 MG tablet Take 500 mg by mouth 2 (two) times daily as needed for moderate pain or headache.    [provider]  albuterol (PROVENTIL) (2.5 MG/3ML) 0.083% nebulizer solution Take 3 mLs (2.5 mg total) by nebulization every 6 (six) hours as needed for wheezing or shortness of breath. 05/21/19   Norval Gable, MD  doxycycline (VIBRA-TABS) 100 MG tablet Take 1 tablet (100 mg total) by mouth 2 (two) times daily. 05/21/19   Norval Gable, MD  Hypromellose (ISOPTO TEARS) 0.5 % SOLN Apply 1 drop to eye 2 (two) times daily.    [provider]  loratadine-pseudoephedrine (CLARITIN-D 24-HOUR) 10-240 MG 24 hr tablet Take 1 tablet by mouth daily.    [provider]  oxymetazoline (AFRIN) 0.05 % nasal spray Place 1 spray into both nostrils 2 (two) times daily. As needed for nose bleed    [provider]  polyethylene glycol powder (GLYCOLAX/MIRALAX) powder Take 17 g by mouth 2 (two) times daily as needed. Patient taking differently:  Take 17 g by mouth daily as needed for mild constipation.  08/03/15   [name], MD  traMADol (ULTRAM) 50 MG tablet Take 1-2 tablets (50-100 mg total) by mouth every 4 (four) hours as needed for moderate pain. 08/21/16   [name], MD    Family History Family History  Problem Relation Age of Onset  . Multiple sclerosis Mother   . Hypertension Mother   . Coronary artery disease Father   . Heart disease Father   . Hypertension Father   . Heart disease Brother   . Cancer Brother   . Breast cancer Paternal Aunt     Social History Social History   Tobacco Use  . Smoking status: Never Smoker  . Smokeless tobacco: Never Used  Substance Use Topics  . Alcohol use: No  . Drug use: No     Allergies   Codeine, Levaquin [levofloxacin], Penicillins, Zithromax [azithromycin], and Prednisone   Review of Systems Review of Systems  Respiratory: Positive for cough and shortness of breath.      Physical Exam Triage Vital Signs ED Triage Vitals  Enc Vitals Group     BP 05/21/19 1320 (!) 127/105     Pulse Rate 05/21/19 1320 91     Resp 05/21/19 1320 16     Temp 05/21/19 1320 98.4 F (36.9 C)     Temp Source 05/21/19 1320 Oral     SpO2 05/21/19 1320 100 %     Weight 05/21/19 1316 260 lb (117.9 kg)     Height 05/21/19 1316 5\' 2"  (1.575 m)     Head Circumference --      Peak Flow --      Pain Score 05/21/19 1315 0     Pain Loc --      Pain Edu? --      Excl. in [place]? --    No data found.  Updated Vital Signs BP (!) 127/105 (BP Location: Left Arm)   Pulse 91   Temp 98.4 F (36.9 C) (Oral)   Resp 16   Ht 5\' 2"  (1.575 m)   Wt 117.9 kg   SpO2 100%   BMI 47.55 kg/m   Visual Acuity Right Eye Distance:   Left Eye Distance:   Bilateral Distance:    Right Eye Near:   Left Eye Near:    Bilateral Near:     Physical Exam Vitals and nursing note  reviewed.  Constitutional:      General: She is not in acute distress.    Appearance: She is not toxic-appearing or  diaphoretic.  Cardiovascular:     Rate and Rhythm: Normal rate.     Heart sounds: Normal heart sounds.  Pulmonary:     Effort: Pulmonary effort is normal. No respiratory distress.     Breath sounds: No stridor. Wheezing (diffuse, bilateral) present. No rhonchi or rales.  Neurological:     Mental Status: She is alert.      UC Treatments / Results  Labs (all labs ordered are listed, but only abnormal results are displayed) Labs Reviewed  NOVEL CORONAVIRUS, NAA (HOSP ORDER, SEND-OUT TO REF LAB; TAT 18-24 HRS)    EKG   Radiology No results found.  Procedures Procedures (including critical care time)  Medications Ordered in UC Medications  methylPREDNISolone sodium succinate (SOLU-MEDROL) 125 mg/2 mL injection 125 mg (has no administration in time range)    Initial Impression / Assessment and Plan / UC Course  I have reviewed the triage vital signs and the nursing notes.  Pertinent labs & imaging results that were available during my care of the patient were reviewed by me and considered in my medical decision making (see chart for details).      Final Clinical Impressions(s) / UC Diagnoses   Final diagnoses:  COPD exacerbation [facility])    ED Prescriptions    Medication Sig Dispense Auth. Provider   albuterol (PROVENTIL) (2.5 MG/3ML) 0.083% nebulizer solution Take 3 mLs (2.5 mg total) by nebulization every 6 (six) hours as needed for wheezing or shortness of breath. 75 mL Norval Gable, MD   doxycycline (VIBRA-TABS) 100 MG tablet Take 1 tablet (100 mg total) by mouth 2 (two) times daily. 20 tablet Norval Gable, MD      1. diagnosis reviewed with patient 2. rx as per orders above; reviewed possible side effects, interactions, risks and benefits  3. Recommend supportive treatment continued chronic inhalers 4. Follow-up prn if symptoms worsen or don't improve  PDMP not reviewed this encounter.   Norval Gable, MD 05/21/19 1407

## 2019-05-21 NOTE — ED Triage Notes (Signed)
Patient c/o SOB and cough that started Thursday.  Patient denies fevers.  Patient states that she has COPD.  Patient states that she uses oxygen at night.  Patient states that she is out of her nebulizer medication.

## 2019-05-22 LAB — NOVEL CORONAVIRUS, NAA (HOSP ORDER, SEND-OUT TO REF LAB; TAT 18-24 HRS): SARS-CoV-2, NAA: NOT DETECTED

## 2019-05-24 ENCOUNTER — Telehealth: Payer: Self-pay

## 2019-05-24 ENCOUNTER — Telehealth: Payer: Self-pay | Admitting: Internal Medicine

## 2019-05-24 MED ORDER — ALBUTEROL SULFATE (2.5 MG/3ML) 0.083% IN NEBU: 2.5000 mg | INHALATION_SOLUTION | Freq: Four times a day (QID) | RESPIRATORY_TRACT | 0 refills | Status: DC | PRN

## 2019-05-24 NOTE — Telephone Encounter (Signed)
PA has been submitted for Anoro Ellipta inhaler on covermymeds.

## 2019-05-24 NOTE — Telephone Encounter (Signed)
Pt states that she was sick over the weekend and went to urgent care. Pt would like a refill on albuterol (PROVENTIL) (2.5 MG/3ML) 0.083% nebulizer solution

## 2019-05-24 NOTE — Addendum Note (Signed)
Addended byElpidio Galea T on: 05/24/2019 03:44 PM   Modules accepted: Orders

## 2019-05-25 MED ORDER — TIOTROPIUM BROMIDE-OLODATEROL 2.5-2.5 MCG/ACT IN AERS: 2.0000 | INHALATION_SPRAY | Freq: Every day | RESPIRATORY_TRACT | 11 refills | Status: DC

## 2019-05-25 MED ORDER — ALBUTEROL SULFATE (2.5 MG/3ML) 0.083% IN NEBU: 2.5000 mg | INHALATION_SOLUTION | Freq: Four times a day (QID) | RESPIRATORY_TRACT | 1 refills | Status: DC | PRN

## 2019-05-25 NOTE — Telephone Encounter (Signed)
Insurance has denied coverage of the [employer] inhaler. They want the pt to try either stiolto respimat inhaler or bevespi aerosphere HFA aerosol inhaler.

## 2019-05-25 NOTE — Telephone Encounter (Signed)
STiolto sent in s alternative to [employer]

## 2019-05-25 NOTE — Addendum Note (Signed)
Addended by: [name] on: 05/25/2019 03:18 PM   Modules accepted: Orders

## 2019-05-25 NOTE — Addendum Note (Signed)
Addended by: Adair Laundry on: 05/25/2019 03:31 PM   Modules accepted: Orders

## 2019-05-25 NOTE — Telephone Encounter (Signed)
Spoke with pt to let her know of the medication change due to insurance not covering the Anoro any longer.

## 2019-05-29 ENCOUNTER — Other Ambulatory Visit: Payer: Self-pay

## 2019-05-29 ENCOUNTER — Ambulatory Visit (INDEPENDENT_AMBULATORY_CARE_PROVIDER_SITE_OTHER): Payer: Medicare HMO

## 2019-05-29 ENCOUNTER — Ambulatory Visit
Admission: EM | Admit: 2019-05-29 | Discharge: 2019-05-29 | Disposition: A | Discharge status: Home / Self Care | Payer: Medicare HMO | Attending: Emergency Medicine | Admitting: Emergency Medicine

## 2019-05-29 ENCOUNTER — Encounter: Payer: Self-pay | Admitting: Emergency Medicine

## 2019-05-29 DIAGNOSIS — J441 Chronic obstructive pulmonary disease with (acute) exacerbation: Secondary | ICD-10-CM

## 2019-05-29 DIAGNOSIS — R0602 Shortness of breath: Secondary | ICD-10-CM | POA: Diagnosis not present

## 2019-05-29 MED ORDER — IPRATROPIUM-ALBUTEROL 0.5-2.5 (3) MG/3ML IN SOLN
3.0000 mL | Freq: Once | RESPIRATORY_TRACT | Status: AC
  Administered 2019-05-29: 3 mL via RESPIRATORY_TRACT

## 2019-05-29 MED ORDER — ALBUTEROL SULFATE (2.5 MG/3ML) 0.083% IN NEBU: 2.5000 mg | INHALATION_SOLUTION | Freq: Four times a day (QID) | RESPIRATORY_TRACT | 1 refills | Status: DC | PRN

## 2019-05-29 MED ORDER — METHYLPREDNISOLONE SODIUM SUCC 40 MG IJ SOLR
80.0000 mg | Freq: Once | INTRAMUSCULAR | Status: AC
  Administered 2019-05-29: 80 mg via INTRAMUSCULAR

## 2019-05-29 NOTE — Discharge Instructions (Signed)
If you develop worsening wheezing or shortness of breath go to the emergency room.  Recommend following up with Dr. Derrel Nip next week.

## 2019-05-29 NOTE — ED Triage Notes (Signed)
Patient c/o SOB that started getting worse over this past week.  Patient states that she started wheezing this morning.  Patient denies fevers. Patient has COPD.

## 2019-05-29 NOTE — ED Provider Notes (Addendum)
[facility]    CSN: [id number] Arrival date & time: 05/29/19  1024      History   Chief Complaint Chief Complaint  Patient presents with  . Shortness of Breath    HPI Lauren Bartlett is a 64 y.o. female.   HPI  64 year old female presents with shortness of breath that started getting worse over this past week.  She started wheezing this morning and her friend brought her here instead of going on to church. I reviewed all available medical records including her most recent visit from 05/21/2019 through reports of her COPD exacerbations in the past as well as CHF and her diastolic dysfunction. Has a significant past medical history of COPD ,chronic diastolic congestive heart failure, paroxysmal atrial fibrillation and pulmonary hypertension.  She was seen here most recently on 2/20 for COPD exacerbation.  At that time she was placed on doxycycline and given an injection of Solu-Medrol 125 mg.  She states this wheezing and shortness of breath developed gradually over the week.  She has not had any swelling of her ankles.  Is accompanied by a fellow church member who kindly brought her in today      Past Medical History:  Diagnosis Date  . Anxiety   . Bleeding from the nose   . Chronic diastolic congestive heart failure ([place])    a. 03/2016 Echo: EF 55-60%, Gr2 DD; b. 08/2016 Echo: EF 60-65%, nl diast fxn.  . Congenital absence of one kidney   . COPD (chronic obstructive pulmonary disease) ([place])    a. 02/2017 PFT: mod/sev obstructive airway dzs w/ significant bronchodilator response.  . Depression    treated at [place]  . Dyspnea   . Emphysema/COPD ([place])   . GERD (gastroesophageal reflux disease)   . History of cardiac cath    a. 05/2016 Cath: nl cors.  . History of sciatica   . Hypertension   . Hypothyroidism   . Mitral Valve Prolapse & Severe mitral regurgitation s/p MVR    a. 03/2016 Echo: severe MVP involving the posterior leaflet, sev MR;  b. 06/2016 min  invasive MV repair w/ triangular resection of flail segment (P2) of posterior leaflet, gore-tex neochord placement x 4, Sorin Memo 3D rin Annulosplasty (53mm, catalog # [id number], ser # [id number]); c. 08/2016 Echo: EF 60-65%, no rwma, triv AI. MV area 2.06 cm^2 (pressure 1/2 time). Nl RV fxn. Mild-mod TR. PASP 72mmHg.  . Motion sickness    cars  . Pneumonia   . PONV (postoperative nausea and vomiting)   . Post-op Afib    a. 06/2016 following MV repair-->short-course amio. Coumadin d/c'd 2/2 epistaxis.  . Pulmonary hypertension ([place])    a. 08/2016 Echo: PASP 68mmHg.  . S/P patent foramen ovale closure    a. 06/2016 @ time of MV Repair.  . Sleep apnea    O2 at night and PRN    Patient Active Problem List   Diagnosis Date Noted  . Dissection of thoracic aorta ([place]) 12/18/2018  . PAF (paroxysmal atrial fibrillation) ([place]) 12/18/2018  . Nocturnal hypoxemia due to obstructive chronic bronchitis ([place]) 02/06/2018  . Grief reaction with prolonged bereavement 08/01/2017  . Encounter for therapeutic drug monitoring 08/13/2016  . S/P minimally invasive mitral valve repair 07/17/2016  . S/P patent foramen ovale closure 07/17/2016  . Chronic diastolic congestive heart failure ([place])   . COPD (chronic obstructive pulmonary disease) ([place]) 05/19/2016  . 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  . Vitamin D deficiency 01/11/2015  . Allergic rhinitis 07/09/2014  .  S/P hysterectomy with oophorectomy 09/22/2013  . Pulmonary hypertension ([place])   . Hypocalcemia 09/10/2012  . Hypothyroidism 05/04/2012  . Obesity (BMI 30-39.9) 08/04/2011  . Hypertension   . Congenital absence of one kidney   . History of sciatica     Past Surgical History:  Procedure Laterality Date  . ABDOMINAL HYSTERECTOMY    . BREAST BIOPSY Right 2011   [facility]< benign  . CATARACT EXTRACTION W/PHACO Left 11/14/2015   Procedure: CATARACT EXTRACTION PHACO AND INTRAOCULAR LENS PLACEMENT (IOC);  Surgeon: Leandrew Koyanagi, MD;  Location: [place];   Service: Ophthalmology;  Laterality: Left;  sleep apnea [place]  . CATARACT EXTRACTION W/PHACO Right 12/19/2015   Procedure: CATARACT EXTRACTION PHACO AND INTRAOCULAR LENS PLACEMENT (IOC);  Surgeon: Leandrew Koyanagi, MD;  Location: [place];  Service: Ophthalmology;  Laterality: Right;  ANXIETY GENEROUS IV SEDATION TORIC LEN  . COMBINED HYSTERECTOMY ABDOMINAL W/ A&P REPAIR / OOPHORECTOMY  1996   benign tumor  . INNER EAR SURGERY     bilateral  . MITRAL VALVE REPAIR Right 07/17/2016   Procedure: MINIMALLY INVASIVE MITRAL VALVE REPAIR (MVR) USING 26 SORIN MEMO 3D ANNULOPLASTY RING;  Surgeon: Rexene Alberts, MD;  Location: [place];  Service: Open Heart Surgery;  Laterality: Right;  . PATENT FORAMEN OVALE(PFO) CLOSURE N/A 07/17/2016   Procedure: PATENT FORAMEN OVALE (PFO) CLOSURE;  Surgeon: Rexene Alberts, MD;  Location: [place];  Service: Open Heart Surgery;  Laterality: N/A;  . RIGHT/LEFT HEART CATH AND CORONARY ANGIOGRAPHY Bilateral 06/16/2016   Procedure: Right/Left Heart Cath and Coronary Angiography;  Surgeon: Wellington Hampshire, MD;  Location: [facility];  Service: Cardiovascular;  Laterality: Bilateral;  . TEE WITHOUT CARDIOVERSION N/A 05/19/2016   Procedure: TRANSESOPHAGEAL ECHOCARDIOGRAM (TEE);  Surgeon: Wellington Hampshire, MD;  Location: [facility];  Service: Cardiovascular;  Laterality: N/A;  . TEE WITHOUT CARDIOVERSION N/A 06/09/2016   Procedure: TRANSESOPHAGEAL ECHOCARDIOGRAM (TEE);  Surgeon: Wellington Hampshire, MD;  Location: [facility];  Service: Cardiovascular;  Laterality: N/A;  . TEE WITHOUT CARDIOVERSION N/A 06/16/2016   Procedure: TRANSESOPHAGEAL ECHOCARDIOGRAM (TEE);  Surgeon: Wellington Hampshire, MD;  Location: [facility];  Service: Cardiovascular;  Laterality: N/A;  . TEE WITHOUT CARDIOVERSION N/A 07/17/2016   Procedure: TRANSESOPHAGEAL ECHOCARDIOGRAM (TEE);  Surgeon: Rexene Alberts, MD;  Location: [place];  Service: Open Heart Surgery;  Laterality: N/A;  . TONSILLECTOMY       OB History   No obstetric history on file.      Home Medications    Prior to Admission medications   Medication Sig Start Date End Date Taking? Authorizing Provider  acetaminophen (TYLENOL) 500 MG tablet Take 500 mg by mouth 2 (two) times daily as needed for moderate pain or headache.   Yes [provider]  aspirin EC 81 MG EC tablet Take 1 tablet (81 mg total) by mouth daily. 07/23/16  Yes Barrett, Erin R, PA-C  doxycycline (VIBRA-TABS) 100 MG tablet Take 1 tablet (100 mg total) by mouth 2 (two) times daily. 05/21/19  Yes Norval Gable, MD  ergocalciferol (DRISDOL) 1.25 MG (50000 UT) capsule Take 1 capsule (50,000 Units total) by mouth once a week. 02/15/19  Yes [name], MD  famotidine (PEPCID AC) 10 MG chewable tablet Chew 10 mg by mouth daily as needed for heartburn.   Yes [provider]  ferrous sulfate 325 (65 FE) MG tablet Take 325 mg by mouth daily with breakfast.   Yes [provider]  fluticasone (FLONASE) 50 MCG/ACT  nasal spray USE 2 SPRAYS IN EACH NOSTRIL EVERY DAY 01/10/19  Yes [name], MD  furosemide (LASIX) 20 MG tablet TAKE 1 TABLET EVERY DAY 01/10/19  Yes [name], MD  Hypromellose (ISOPTO TEARS) 0.5 % SOLN Apply 1 drop to eye 2 (two) times daily.   Yes [provider]  levothyroxine (SYNTHROID) 150 MCG tablet Take 1 tablet (150 mcg total) by mouth daily before breakfast. 04/14/19  Yes [name], MD  loratadine-pseudoephedrine (CLARITIN-D 24-HOUR) 10-240 MG 24 hr tablet Take 1 tablet by mouth daily.   Yes [provider]  losartan (COZAAR) 50 MG tablet TAKE 1 TABLET EVERY DAY 01/14/19  Yes [name], MD  OXYGEN Inhale 2 L into the lungs at bedtime as needed (at bedtime and as needed).   Yes [provider]  oxymetazoline (AFRIN) 0.05 % nasal spray Place 1 spray into both nostrils 2 (two) times daily. As needed for nose bleed   Yes [provider]  polyethylene glycol powder  (GLYCOLAX/MIRALAX) powder Take 17 g by mouth 2 (two) times daily as needed. Patient taking differently: Take 17 g by mouth daily as needed for mild constipation.  08/03/15  Yes [name], MD  Tiotropium Bromide-Olodaterol 2.5-2.5 MCG/ACT AERS Inhale 2 puffs into the lungs daily. 05/25/19  Yes [name], MD  traMADol (ULTRAM) 50 MG tablet Take 1-2 tablets (50-100 mg total) by mouth every 4 (four) hours as needed for moderate pain. 08/21/16  Yes [name], MD  vitamin B-12 (CYANOCOBALAMIN) 1000 MCG tablet Take 1 tablet (1,000 mcg total) by mouth daily. 08/14/16  Yes [name], MD  albuterol (PROVENTIL) (2.5 MG/3ML) 0.083% nebulizer solution Take 3 mLs (2.5 mg total) by nebulization every 6 (six) hours as needed for wheezing or shortness of breath. 05/29/19   Lorin Picket, PA-C    Family History Family History  Problem Relation Age of Onset  . Multiple sclerosis Mother   . Hypertension Mother   . Coronary artery disease Father   . Heart disease Father   . Hypertension Father   . Heart disease Brother   . Cancer Brother   . Breast cancer Paternal Aunt     Social History Social History   Tobacco Use  . Smoking status: Never Smoker  . Smokeless tobacco: Never Used  Substance Use Topics  . Alcohol use: No  . Drug use: No     Allergies   Codeine, Levaquin [levofloxacin], Penicillins, Zithromax [azithromycin], and Prednisone   Review of Systems Review of Systems  Constitutional: Positive for activity change. Negative for appetite change, chills, diaphoresis, fatigue and fever.  HENT: Positive for congestion.   Respiratory: Positive for cough, shortness of breath and wheezing. Negative for stridor.   Cardiovascular: Negative for chest pain, palpitations and leg swelling.  All other systems reviewed and are negative.    Physical Exam Triage Vital Signs ED Triage Vitals  Enc Vitals Group     BP 05/29/19 1051 131/74     Pulse Rate 05/29/19 1051 97      Resp 05/29/19 1051 17     Temp 05/29/19 1051 98 F (36.7 C)     Temp Source 05/29/19 1051 Oral     SpO2 05/29/19 1051 98 %     Weight 05/29/19 1048 260 lb (117.9 kg)     Height 05/29/19 1048 5\' 3"  (1.6 m)     Head Circumference --      Peak Flow --  Pain Score 05/29/19 1047 0     Pain Loc --      Pain Edu? --      Excl. in [place]? --    No data found.  Updated Vital Signs BP 128/68 (BP Location: Left Arm)   Pulse 91   Temp 98 F (36.7 C) (Oral)   Resp 20   Ht 5\' 3"  (1.6 m)   Wt 260 lb (117.9 kg)   SpO2 96%   BMI 46.06 kg/m   Visual Acuity Right Eye Distance:   Left Eye Distance:   Bilateral Distance:    Right Eye Near:   Left Eye Near:    Bilateral Near:     Physical Exam Vitals and nursing note reviewed.  Constitutional:      General: She is not in acute distress.    Appearance: She is well-developed. She is obese. She is not ill-appearing or toxic-appearing.  HENT:     Head: Normocephalic and atraumatic.     Mouth/Throat:     Mouth: Mucous membranes are moist.     Pharynx: Oropharynx is clear.  Cardiovascular:     Rate and Rhythm: Normal rate and regular rhythm.  Pulmonary:     Breath sounds: No stridor. Wheezing present. No rhonchi or rales.  Chest:     Chest wall: No tenderness or crepitus.  Musculoskeletal:        General: Normal range of motion.     Cervical back: Normal range of motion and neck supple.  Skin:    General: Skin is warm and dry.  Neurological:     General: No focal deficit present.     Mental Status: She is alert and oriented to person, place, and time.  Psychiatric:        Mood and Affect: Mood normal.        Behavior: Behavior normal.      UC Treatments / Results  Labs (all labs ordered are listed, but only abnormal results are displayed) Labs Reviewed - No data to display  EKG   Radiology DG Chest 2 View  Result Date: 05/29/2019 CLINICAL DATA:  Acute shortness of breath for 1 week. EXAM: CHEST - 2 VIEW COMPARISON:   08/04/2016 and prior radiographs FINDINGS: The cardiomediastinal silhouette is unremarkable. Elevated RIGHT hemidiaphragm and RIGHT basilar scarring again noted. There is no evidence of focal airspace disease, pulmonary edema, suspicious pulmonary nodule/mass, pleural effusion, or pneumothorax. No acute bony abnormalities are identified. An apex RIGHT thoracic scoliosis is again noted. IMPRESSION: No evidence of acute cardiopulmonary disease. Electronically Signed   By: Margarette Canada M.D.   On: 05/29/2019 11:17    Procedures Procedures (including critical care time)  Medications Ordered in UC Medications  methylPREDNISolone sodium succinate (SOLU-MEDROL) 40 mg/mL injection 80 mg (80 mg Intramuscular Given 05/29/19 1132)  ipratropium-albuterol (DUONEB) 0.5-2.5 (3) MG/3ML nebulizer solution 3 mL (3 mLs Nebulization Given 05/29/19 1137)  ipratropium-albuterol (DUONEB) 0.5-2.5 (3) MG/3ML nebulizer solution 3 mL (3 mLs Nebulization Given 05/29/19 1155)    Initial Impression / Assessment and Plan / UC Course  I have reviewed the triage vital signs and the nursing notes.  Pertinent labs & imaging results that were available during my care of the patient were reviewed by me and considered in my medical decision making (see chart for details).   64 year old female presents with shortness of breath and lower wheezing that has worsened over this past week. She was seen on 05/21/2019 diagnosed with exacerbation of COPD started on doxycycline  and given 125 mg of prednisone intramuscularly. States that the prednisone helps her immensely when she has a flare. Continue with her medications at home but states that she is out of her nebulizer albuterol medication  She was given 1 nebulizer treatment of DuoNeb. This helped decrease her wheezing but a second 1 was then also ordered. Halfway through the treatment she began coughing and having sputum production. Her nebulizer treatment was stopped at this point. She had  received 80 mg of Solu-Medrol intramuscularly. She did state that she was improved and physical exam showed that her wheezing had decreased markedly. Her coughing did persist with the sputum production. At the time of her discharge her O2 sats were 96% respirations were 20 pulse rate of 91. X-ray of the chest showed no evidence of acute pulmonary disease present. She did state she was feeling much better and was felt that she was ready for discharge. She was given parameters for returning to the emergency room if she developed a fever or if she had worsening symptoms or shortness of breath she should call 911. She left our facility stable in the care of her church member. To follow-up with Dr. Derrel Nip next week.  Final Clinical Impressions(s) / UC Diagnoses   Final diagnoses:  COPD exacerbation [facility])     Discharge Instructions     If you develop worsening wheezing or shortness of breath go to the emergency room.  Recommend following up with Dr. Derrel Nip next week.    ED Prescriptions    Medication Sig Dispense Auth. Provider   albuterol (PROVENTIL) (2.5 MG/3ML) 0.083% nebulizer solution Take 3 mLs (2.5 mg total) by nebulization every 6 (six) hours as needed for wheezing or shortness of breath. 75 mL Lorin Picket, PA-C     PDMP not reviewed this encounter.      Lorin Picket, PA-C 05/29/19 [phone]

## 2019-06-02 ENCOUNTER — Other Ambulatory Visit: Payer: Medicare HMO

## 2019-06-02 ENCOUNTER — Encounter: Payer: Self-pay | Admitting: Nurse Practitioner

## 2019-06-02 ENCOUNTER — Telehealth: Payer: Self-pay | Admitting: Cardiovascular Disease

## 2019-06-02 ENCOUNTER — Ambulatory Visit (INDEPENDENT_AMBULATORY_CARE_PROVIDER_SITE_OTHER): Payer: Medicare HMO | Admitting: Nurse Practitioner

## 2019-06-02 ENCOUNTER — Other Ambulatory Visit: Payer: Self-pay

## 2019-06-02 VITALS — BP 122/72 | HR 86 | Ht 63.0 in | Wt 263.5 lb

## 2019-06-02 DIAGNOSIS — J449 Chronic obstructive pulmonary disease, unspecified: Secondary | ICD-10-CM | POA: Diagnosis not present

## 2019-06-02 DIAGNOSIS — I10 Essential (primary) hypertension: Secondary | ICD-10-CM

## 2019-06-02 DIAGNOSIS — Z9889 Other specified postprocedural states: Secondary | ICD-10-CM

## 2019-06-02 DIAGNOSIS — I2721 Secondary pulmonary arterial hypertension: Secondary | ICD-10-CM

## 2019-06-02 DIAGNOSIS — I5033 Acute on chronic diastolic (congestive) heart failure: Secondary | ICD-10-CM

## 2019-06-02 MED ORDER — FUROSEMIDE 20 MG PO TABS: 20.0000 mg | ORAL_TABLET | Freq: Two times a day (BID) | ORAL | 3 refills | Status: DC

## 2019-06-02 NOTE — Progress Notes (Signed)
Office Visit    Patient Name: Lauren Bartlett Date of Encounter: 06/02/2019  Primary Care Provider:  [name], MD Primary Cardiologist:  Kathlyn Sacramento, MD  Chief Complaint    64 y/o ? w/ a h/o mitral valve prolapse and severe mitral regurgitation status post mitral valve repair (April 2018), hypertension, HFpEF, COPD, pulmonary arterial hypertension, congenital solitary kidney, and hypothyroidism, who presents for follow-up related to HFpEF.  Past Medical History    Past Medical History:  Diagnosis Date  . Anxiety   . Bleeding from the nose   . Chronic diastolic congestive heart failure ([place])    a. 03/2016 Echo: EF 55-60%, Gr2 DD; b. 08/2016 Echo: EF 60-65%, nl diast fxn; c. 01/2019 Echo: EF 55-60%. Nl RV fxn.  . Congenital absence of one kidney   . COPD (chronic obstructive pulmonary disease) ([place])    a. 02/2017 PFT: mod/sev obstructive airway dzs w/ significant bronchodilator response.  . Depression    treated at [place]  . Dyspnea   . Emphysema/COPD ([place])   . GERD (gastroesophageal reflux disease)   . History of cardiac cath    a. 05/2016 Cath: nl cors.  . History of sciatica   . Hypertension   . Hypothyroidism   . Mitral Valve Prolapse & Severe mitral regurgitation s/p MVR    a. 03/2016 Echo: severe MVP involving the posterior leaflet, sev MR;  b. 06/2016 min invasive MV repair w/ triangular resection of flail segment (P2) of posterior leaflet, gore-tex neochord placement x 4, Sorin Memo 3D rin Annulosplasty (41mm, catalog # [id number], ser # [id number]); c. 08/2016 Echo: MV area 2.06 cm^2 (pressure 1/2 time); d. 01/2019 Echo: Mild MS w/ mean grad 32mmHg.  . Motion sickness    cars  . Pneumonia   . PONV (postoperative nausea and vomiting)   . Post-op Afib    a. 06/2016 following MV repair-->short-course amio. Coumadin d/c'd 2/2 epistaxis.  . Pulmonary hypertension ([place])    a. 08/2016 Echo: PASP 49mmHg; b. 01/2019 Echo: RVSP 45.83mmHg.  . S/P patent foramen ovale closure      a. 06/2016 @ time of MV Repair.  . Sleep apnea    O2 at night and PRN   Past Surgical History:  Procedure Laterality Date  . ABDOMINAL HYSTERECTOMY    . BREAST BIOPSY Right 2011   [facility]< benign  . CATARACT EXTRACTION W/PHACO Left 11/14/2015   Procedure: CATARACT EXTRACTION PHACO AND INTRAOCULAR LENS PLACEMENT (IOC);  Surgeon: Leandrew Koyanagi, MD;  Location: [place];  Service: Ophthalmology;  Laterality: Left;  sleep apnea [place]  . CATARACT EXTRACTION W/PHACO Right 12/19/2015   Procedure: CATARACT EXTRACTION PHACO AND INTRAOCULAR LENS PLACEMENT (IOC);  Surgeon: Leandrew Koyanagi, MD;  Location: [place];  Service: Ophthalmology;  Laterality: Right;  ANXIETY GENEROUS IV SEDATION TORIC LEN  . COMBINED HYSTERECTOMY ABDOMINAL W/ A&P REPAIR / OOPHORECTOMY  1996   benign tumor  . INNER EAR SURGERY     bilateral  . MITRAL VALVE REPAIR Right 07/17/2016   Procedure: MINIMALLY INVASIVE MITRAL VALVE REPAIR (MVR) USING 26 SORIN MEMO 3D ANNULOPLASTY RING;  Surgeon: Rexene Alberts, MD;  Location: [place];  Service: Open Heart Surgery;  Laterality: Right;  . PATENT FORAMEN OVALE(PFO) CLOSURE N/A 07/17/2016   Procedure: PATENT FORAMEN OVALE (PFO) CLOSURE;  Surgeon: Rexene Alberts, MD;  Location: [place];  Service: Open Heart Surgery;  Laterality: N/A;  . RIGHT/LEFT HEART CATH AND CORONARY ANGIOGRAPHY Bilateral 06/16/2016   Procedure: Right/Left  Heart Cath and Coronary Angiography;  Surgeon: Wellington Hampshire, MD;  Location: [place] [facility];  Service: Cardiovascular;  Laterality: Bilateral;  . TEE WITHOUT CARDIOVERSION N/A 05/19/2016   Procedure: TRANSESOPHAGEAL ECHOCARDIOGRAM (TEE);  Surgeon: Wellington Hampshire, MD;  Location: [facility];  Service: Cardiovascular;  Laterality: N/A;  . TEE WITHOUT CARDIOVERSION N/A 06/09/2016   Procedure: TRANSESOPHAGEAL ECHOCARDIOGRAM (TEE);  Surgeon: Wellington Hampshire, MD;  Location: [facility];  Service: Cardiovascular;  Laterality: N/A;  . TEE WITHOUT  CARDIOVERSION N/A 06/16/2016   Procedure: TRANSESOPHAGEAL ECHOCARDIOGRAM (TEE);  Surgeon: Wellington Hampshire, MD;  Location: [facility];  Service: Cardiovascular;  Laterality: N/A;  . TEE WITHOUT CARDIOVERSION N/A 07/17/2016   Procedure: TRANSESOPHAGEAL ECHOCARDIOGRAM (TEE);  Surgeon: Rexene Alberts, MD;  Location: [place];  Service: Open Heart Surgery;  Laterality: N/A;  . TONSILLECTOMY      Allergies  Allergies  Allergen Reactions  . Codeine Hives and Nausea And Vomiting  . Levaquin [Levofloxacin] Nausea And Vomiting  . Penicillins Hives, Itching and Other (See Comments)    Has patient had a PCN reaction causing immediate rash, facial/tongue/throat swelling, SOB or lightheadedness with hypotension: No Has patient had a PCN reaction causing severe rash involving mucus membranes or skin necrosis: No Has patient had a PCN reaction that required hospitalization No Has patient had a PCN reaction occurring within the last 10 years: No If all of the above answers are "NO", then may proceed with Cephalosporin use.  [name] Zithromax [Azithromycin] Other (See Comments)    Reaction:  Hallucinations   . Prednisone Palpitations and Other (See Comments)    Reaction:  Hallucinations  Can take injections but not oral meds    History of Present Illness    64 year old female with a history of mitral valve prolapse and severe mitral regurgitation status post mitral valve repair, hypertension, HFpEF, COPD, PAH, and hypothyroidism.  Generally successful mitral valve repair with PFO closure in April 2018.  Catheterization prior to surgery revealed normal coronary arteries.  Follow-up echo in June 2018 showed normal LV function, normal functioning mitral valve, mild to moderate TR, and PAH with a PASP of 54 mmHg.  At her last office visit in October 2020, she reported relative inactivity in the setting of the COVID-19 pandemic and with that, she felt more deconditioned and was up 12 pounds.  Follow-up echocardiogram in  November showed normal LV function with mild mitral stenosis.  RVSP was 45.3 mmHg.  She was recently seen in the emergency department on February 20, in the setting of dyspnea and wheezing.  She was treated with IV Solu-Medrol and prescribed albuterol nebulizer and a 10-day course of doxycycline.  She was seen in the ED again on February 28, due to a 1 week history of progressive dyspnea with cough and congestion.  Per notes, she was wheezing on exam.  She was treated with nebulizer and intramuscular Solu-Medrol.  Chest x-ray showed no acute pulmonary disease.  She was subsequently discharged home as she was feeling better.  Unfortunately, since her most recent ER visit, she has continued to have dyspnea on exertion and expiratory wheezing.  She also notes sinus and chest congestion.  She has had periodic cough which is intermittently productive.  She has not had any fevers or chills.  She has noticed increasing lower extremity edema but denies chest pain, palpitations, PND, orthopnea, dizziness, syncope, or early satiety.  Her weight is 263 pounds today, which is an additional 13 pounds from when I saw  her in October 2020.  In all, she is up 30 pounds since August 2019.  Home Medications    Prior to Admission medications   Medication Sig Start Date End Date Taking? Authorizing Provider  acetaminophen (TYLENOL) 500 MG tablet Take 500 mg by mouth 2 (two) times daily as needed for moderate pain or headache.    [provider]  albuterol (PROVENTIL) (2.5 MG/3ML) 0.083% nebulizer solution Take 3 mLs (2.5 mg total) by nebulization every 6 (six) hours as needed for wheezing or shortness of breath. 05/29/19   Lorin Picket, PA-C  aspirin EC 81 MG EC tablet Take 1 tablet (81 mg total) by mouth daily. 07/23/16   Barrett, Erin R, PA-C  doxycycline (VIBRA-TABS) 100 MG tablet Take 1 tablet (100 mg total) by mouth 2 (two) times daily. 05/21/19   Norval Gable, MD  ergocalciferol (DRISDOL) 1.25 MG (50000  UT) capsule Take 1 capsule (50,000 Units total) by mouth once a week. 02/15/19   [name], MD  famotidine (PEPCID AC) 10 MG chewable tablet Chew 10 mg by mouth daily as needed for heartburn.    [provider]  ferrous sulfate 325 (65 FE) MG tablet Take 325 mg by mouth daily with breakfast.    [provider]  fluticasone (FLONASE) 50 MCG/ACT nasal spray USE 2 SPRAYS IN EACH NOSTRIL EVERY DAY 01/10/19   [name], MD  furosemide (LASIX) 20 MG tablet TAKE 1 TABLET EVERY DAY 01/10/19   [name], MD  Hypromellose (ISOPTO TEARS) 0.5 % SOLN Apply 1 drop to eye 2 (two) times daily.    [provider]  levothyroxine (SYNTHROID) 150 MCG tablet Take 1 tablet (150 mcg total) by mouth daily before breakfast. 04/14/19   [name], MD  loratadine-pseudoephedrine (CLARITIN-D 24-HOUR) 10-240 MG 24 hr tablet Take 1 tablet by mouth daily.    [provider]  losartan (COZAAR) 50 MG tablet TAKE 1 TABLET EVERY DAY 01/14/19   [name], MD  OXYGEN Inhale 2 L into the lungs at bedtime as needed (at bedtime and as needed).    [provider]  oxymetazoline (AFRIN) 0.05 % nasal spray Place 1 spray into both nostrils 2 (two) times daily. As needed for nose bleed    [provider]  polyethylene glycol powder (GLYCOLAX/MIRALAX) powder Take 17 g by mouth 2 (two) times daily as needed. Patient taking differently: Take 17 g by mouth daily as needed for mild constipation.  08/03/15   [name], MD  Tiotropium Bromide-Olodaterol 2.5-2.5 MCG/ACT AERS Inhale 2 puffs into the lungs daily. 05/25/19   [name], MD  traMADol (ULTRAM) 50 MG tablet Take 1-2 tablets (50-100 mg total) by mouth every 4 (four) hours as needed for moderate pain. 08/21/16   [name], MD  vitamin B-12 (CYANOCOBALAMIN) 1000 MCG tablet Take 1 tablet (1,000 mcg total) by mouth daily. 08/14/16   [name], MD    Review of Systems    Ongoing dyspnea with  sinus and chest congestion and occasional productive cough.  She has also noticed increasing lower extremity edema.  She denies fever, chills, chest pain, palpitations, PND, orthopnea, dizziness, syncope, or early satiety.  All other systems reviewed and are otherwise negative except as noted above.  Physical Exam    VS:  BP 122/72 (BP Location: Left Arm, Patient Position: Sitting, Cuff Size: Large)   Pulse 86   Ht 5\' 3"  (1.6 m)   Wt 263  lb 8 oz (119.5 kg)   BMI 46.68 kg/m  , BMI Body mass index is 46.68 kg/m. GEN: Well nourished, well developed, in no acute distress. HEENT: normal. Neck: Supple, moderately elevated JVP, no carotid bruits, or masses. Cardiac: RRR, no murmurs, rubs, or gallops. No clubbing, cyanosis.  2+ bilateral lower extremity edema to the mid calves.  Radials/PT 2+ and equal bilaterally.  Respiratory:  Respirations regular and unlabored, faint expiratory wheezing noted bilaterally.  Bibasilar crackles.   GI: Soft, nontender, nondistended, BS + x 4. MS: no deformity or atrophy. Skin: warm and dry, no rash. Neuro:  Strength and sensation are intact. Psych: Normal affect.  Accessory Clinical Findings    ECG personally reviewed by me today -regular sinus rhythm, 86, left atrial enlargement, nonspecific ST and T changes- no acute changes.  Lab Results  Component Value Date   WBC 9.5 10/20/2016   HGB 13.7 10/20/2016   HCT 42.3 10/20/2016   MCV 87.2 10/20/2016   PLT 200 10/20/2016   Lab Results  Component Value Date   CREATININE 1.00 04/13/2019   BUN 19 04/13/2019   NA 139 04/13/2019   K 4.2 04/13/2019   CL 99 04/13/2019   CO2 29 04/13/2019   Lab Results  Component Value Date   ALT 20 04/13/2019   AST 23 04/13/2019   ALKPHOS 75 04/13/2019   BILITOT 0.5 04/13/2019   Lab Results  Component Value Date   CHOL 200 01/26/2019   HDL 57.40 01/26/2019   LDLCALC 121 (H) 01/26/2019   LDLDIRECT 101.0 07/30/2017   TRIG 111.0 01/26/2019   CHOLHDL 3 01/26/2019      Lab Results  Component Value Date   HGBA1C 5.5 01/26/2019    Assessment & Plan    1.  Acute on chronic HFpEF: Echo in November showed normal LV function with an RVSP of 45.3 mmHg.  Over the past month, patient has been having increasing dyspnea and has been seen in the ER twice.  On both occasions, she was noted to be wheezing and was treated with a steroid injection followed by nebulizers.  She was also placed on doxycycline for 10-day course at her first visit.  Chest x-ray on February 28 did not show any acute cardiopulmonary disease.  She did feel better following steroid injections but over the past week, she has had return of wheezing, chest and sinus congestion, dyspnea, and now progressive lower extremity edema.  Her weight is up 13 pounds since I saw her in October and 30 pounds since the summer 2019.  She does have 2+ bilateral lower extremity edema and moderately elevated JVP on examination.  Fine basilar crackles can also be heard.  Although I think acute exacerbations of COPD are playing a large role in her recent dyspnea and wheezing, perhaps secondary to recent steroid injections, she is retaining fluid and elevated filling pressures are likely complicating management of her respiratory status.  Heart rate and blood pressure stable.  I have asked her to increase Lasix to 40 mg daily for 1 week and we will plan to see her back next week to reevaluate.  She has follow-up with pulmonology on March 17.  2.  Pulmonary arterial hypertension: As noted above, she is volume overloaded and I suspect elevated filling pressures are contributing to her dyspnea as she has little reserve in the setting of COPD.  Diuresis as above.  3.  AECOPD: Patient has been dealing with increased wheezing over the past several weeks.  She  has been seen in the ER twice and recently completed a course of doxycycline.  She is using nebulizers 4 times a day and rescue inhaler 3 times per day.  She does have faint  wheezing on exam today.  As above, diuresing in the setting of mild volume overload.  She follows up with pulmonology in roughly 2 weeks.  4.  Essential hypertension: Stable.  5.  Status post mitral valve repair: Echo November 2020 showed normal LV function with only mild mitral stenosis with a mean gradient of 8 mmHg.  6.  Disposition: Follow-up in clinic in 1 week or sooner if necessary.  Murray Hodgkins, NP 06/02/2019, 12:13 PM

## 2019-06-02 NOTE — Telephone Encounter (Signed)

## 2019-06-02 NOTE — Patient Instructions (Signed)
Medication Instructions:   1. Your physician has recommended you make the following change in your medication:   - LASIX- Take 2 tablets (40MG  total)for 1 week. Then resume 1 Tablet (20MG ) by mouth daily.    *If you need a refill on your cardiac medications before your next appointment, please call your pharmacy*   Lab Work:  1. None Ordered   If you have labs (blood work) drawn today and your tests are completely normal, you will receive your results only by: [name] MyChart Message (if you have MyChart) OR . A paper copy in the mail If you have any lab test that is abnormal or we need to change your treatment, we will call you to review the results.   Testing/Procedures:  1. None Ordered   Follow-Up: At [facility], you and your health needs are our priority.  As part of our continuing mission to provide you with exceptional heart care, we have created designated Provider Care Teams.  These Care Teams include your primary Cardiologist (physician) and Advanced Practice Providers (APPs -  Physician Assistants and Nurse Practitioners) who all work together to provide you with the care you need, when you need it.  We recommend signing up for the patient portal called "MyChart".  Sign up information is provided on this After Visit Summary.  MyChart is used to connect with patients for Virtual Visits (Telemedicine).  Patients are able to view lab/test results, encounter notes, upcoming appointments, etc.  Non-urgent messages can be sent to your provider as well.   To learn more about what you can do with MyChart, go to [email or web address].    Your next appointment:   1 week(s)  The format for your next appointment:   Virtual Visit  - TELEPHONE   Provider:   Ignacia Bayley

## 2019-06-06 DIAGNOSIS — J449 Chronic obstructive pulmonary disease, unspecified: Secondary | ICD-10-CM | POA: Diagnosis not present

## 2019-06-09 ENCOUNTER — Inpatient Hospital Stay
Admission: EM | Admit: 2019-06-09 | Discharge: 2019-06-11 | DRG: 191 | Disposition: A | Discharge status: Home / Self Care | Payer: Medicare HMO | Attending: Family Medicine | Admitting: Family Medicine

## 2019-06-09 ENCOUNTER — Other Ambulatory Visit: Payer: Self-pay

## 2019-06-09 ENCOUNTER — Observation Stay: Payer: Medicare HMO

## 2019-06-09 ENCOUNTER — Encounter: Payer: Self-pay | Admitting: Emergency Medicine

## 2019-06-09 ENCOUNTER — Telehealth: Payer: Medicare HMO | Admitting: Nurse Practitioner

## 2019-06-09 ENCOUNTER — Emergency Department: Payer: Medicare HMO

## 2019-06-09 ENCOUNTER — Telehealth: Payer: Self-pay | Admitting: Nurse Practitioner

## 2019-06-09 DIAGNOSIS — M7989 Other specified soft tissue disorders: Secondary | ICD-10-CM | POA: Diagnosis present

## 2019-06-09 DIAGNOSIS — I110 Hypertensive heart disease with heart failure: Secondary | ICD-10-CM | POA: Diagnosis not present

## 2019-06-09 DIAGNOSIS — I272 Pulmonary hypertension, unspecified: Secondary | ICD-10-CM | POA: Diagnosis not present

## 2019-06-09 DIAGNOSIS — Z9842 Cataract extraction status, left eye: Secondary | ICD-10-CM

## 2019-06-09 DIAGNOSIS — I480 Paroxysmal atrial fibrillation: Secondary | ICD-10-CM | POA: Diagnosis present

## 2019-06-09 DIAGNOSIS — J439 Emphysema, unspecified: Principal | ICD-10-CM | POA: Diagnosis present

## 2019-06-09 DIAGNOSIS — J441 Chronic obstructive pulmonary disease with (acute) exacerbation: Secondary | ICD-10-CM | POA: Diagnosis not present

## 2019-06-09 DIAGNOSIS — Z91018 Allergy to other foods: Secondary | ICD-10-CM

## 2019-06-09 DIAGNOSIS — Z885 Allergy status to narcotic agent status: Secondary | ICD-10-CM

## 2019-06-09 DIAGNOSIS — Z880 Allergy status to penicillin: Secondary | ICD-10-CM | POA: Diagnosis not present

## 2019-06-09 DIAGNOSIS — Z9981 Dependence on supplemental oxygen: Secondary | ICD-10-CM

## 2019-06-09 DIAGNOSIS — E039 Hypothyroidism, unspecified: Secondary | ICD-10-CM | POA: Diagnosis present

## 2019-06-09 DIAGNOSIS — Z90710 Acquired absence of both cervix and uterus: Secondary | ICD-10-CM | POA: Diagnosis not present

## 2019-06-09 DIAGNOSIS — R0602 Shortness of breath: Secondary | ICD-10-CM | POA: Diagnosis not present

## 2019-06-09 DIAGNOSIS — Z9841 Cataract extraction status, right eye: Secondary | ICD-10-CM

## 2019-06-09 DIAGNOSIS — G4730 Sleep apnea, unspecified: Secondary | ICD-10-CM | POA: Diagnosis present

## 2019-06-09 DIAGNOSIS — Z881 Allergy status to other antibiotic agents status: Secondary | ICD-10-CM

## 2019-06-09 DIAGNOSIS — J9601 Acute respiratory failure with hypoxia: Secondary | ICD-10-CM | POA: Diagnosis present

## 2019-06-09 DIAGNOSIS — F329 Major depressive disorder, single episode, unspecified: Secondary | ICD-10-CM | POA: Diagnosis present

## 2019-06-09 DIAGNOSIS — Z20822 Contact with and (suspected) exposure to covid-19: Secondary | ICD-10-CM | POA: Diagnosis not present

## 2019-06-09 DIAGNOSIS — K219 Gastro-esophageal reflux disease without esophagitis: Secondary | ICD-10-CM | POA: Diagnosis present

## 2019-06-09 DIAGNOSIS — J449 Chronic obstructive pulmonary disease, unspecified: Secondary | ICD-10-CM | POA: Diagnosis not present

## 2019-06-09 DIAGNOSIS — Z7982 Long term (current) use of aspirin: Secondary | ICD-10-CM

## 2019-06-09 DIAGNOSIS — R600 Localized edema: Secondary | ICD-10-CM | POA: Diagnosis not present

## 2019-06-09 DIAGNOSIS — Z8774 Personal history of (corrected) congenital malformations of heart and circulatory system: Secondary | ICD-10-CM

## 2019-06-09 DIAGNOSIS — Z961 Presence of intraocular lens: Secondary | ICD-10-CM | POA: Diagnosis present

## 2019-06-09 DIAGNOSIS — E8770 Fluid overload, unspecified: Secondary | ICD-10-CM | POA: Diagnosis not present

## 2019-06-09 DIAGNOSIS — Z79899 Other long term (current) drug therapy: Secondary | ICD-10-CM

## 2019-06-09 DIAGNOSIS — R609 Edema, unspecified: Secondary | ICD-10-CM

## 2019-06-09 DIAGNOSIS — Z8249 Family history of ischemic heart disease and other diseases of the circulatory system: Secondary | ICD-10-CM

## 2019-06-09 DIAGNOSIS — I5032 Chronic diastolic (congestive) heart failure: Secondary | ICD-10-CM | POA: Diagnosis not present

## 2019-06-09 DIAGNOSIS — R062 Wheezing: Secondary | ICD-10-CM | POA: Diagnosis present

## 2019-06-09 DIAGNOSIS — Z952 Presence of prosthetic heart valve: Secondary | ICD-10-CM

## 2019-06-09 DIAGNOSIS — Z79890 Hormone replacement therapy (postmenopausal): Secondary | ICD-10-CM

## 2019-06-09 LAB — COMPREHENSIVE METABOLIC PANEL
ALT: 19 U/L (ref 0–44)
AST: 18 U/L (ref 15–41)
Albumin: 4.1 g/dL (ref 3.5–5.0)
Alkaline Phosphatase: 59 U/L (ref 38–126)
Anion gap: 10 (ref 5–15)
BUN: 12 mg/dL (ref 8–23)
CO2: 27 mmol/L (ref 22–32)
Calcium: 7.8 mg/dL — ABNORMAL LOW (ref 8.9–10.3)
Chloride: 100 mmol/L (ref 98–111)
Creatinine, Ser: 0.75 mg/dL (ref 0.44–1.00)
GFR calc Af Amer: >60 mL/min (ref >60)
GFR calc non Af Amer: >60 mL/min (ref >60)
Glucose, Bld: 93 mg/dL (ref 70–99)
Potassium: 4 mmol/L (ref 3.5–5.1)
Sodium: 137 mmol/L (ref 135–145)
Total Bilirubin: 0.9 mg/dL (ref 0.3–1.2)
Total Protein: 7.5 g/dL (ref 6.5–8.1)

## 2019-06-09 LAB — CBC WITH DIFFERENTIAL/PLATELET
Abs Immature Granulocytes: 0.03 10*3/uL (ref 0.00–0.07)
Basophils Absolute: 0 10*3/uL (ref 0.0–0.1)
Basophils Relative: 1 %
Eosinophils Absolute: 0.5 10*3/uL (ref 0.0–0.5)
Eosinophils Relative: 6 %
HCT: 44 % (ref 36.0–46.0)
Hemoglobin: 13.9 g/dL (ref 12.0–15.0)
Immature Granulocytes: 0 %
Lymphocytes Relative: 13 %
Lymphs Abs: 1 10*3/uL (ref 0.7–4.0)
MCH: 30.5 pg (ref 26.0–34.0)
MCHC: 31.6 g/dL (ref 30.0–36.0)
MCV: 96.5 fL (ref 80.0–100.0)
Monocytes Absolute: 0.6 10*3/uL (ref 0.1–1.0)
Monocytes Relative: 8 %
Neutro Abs: 5.5 10*3/uL (ref 1.7–7.7)
Neutrophils Relative %: 72 %
Platelets: 164 10*3/uL (ref 150–400)
RBC: 4.56 MIL/uL (ref 3.87–5.11)
RDW: 13.8 % (ref 11.5–15.5)
WBC: 7.8 10*3/uL (ref 4.0–10.5)
nRBC: 0 % (ref 0.0–0.2)

## 2019-06-09 LAB — BLOOD GAS, VENOUS
Acid-Base Excess: 6.1 mmol/L — ABNORMAL HIGH (ref 0.0–2.0)
Bicarbonate: 34 mmol/L — ABNORMAL HIGH (ref 20.0–28.0)
FIO2: 0.21
O2 Saturation: 35.7 %
Patient temperature: 37
pCO2, Ven: 63 mmHg — ABNORMAL HIGH (ref 44.0–60.0)
pH, Ven: 7.34 (ref 7.250–7.430)
pO2, Ven: <31 mmHg — CL (ref 32.0–45.0)

## 2019-06-09 LAB — RESPIRATORY PANEL BY RT PCR (FLU A&B, COVID)
Influenza A by PCR: NEGATIVE
Influenza B by PCR: NEGATIVE
SARS Coronavirus 2 by RT PCR: NEGATIVE

## 2019-06-09 LAB — BRAIN NATRIURETIC PEPTIDE: B Natriuretic Peptide: 65 pg/mL (ref 0.0–100.0)

## 2019-06-09 LAB — TROPONIN I (HIGH SENSITIVITY)
Troponin I (High Sensitivity): 5 ng/L (ref <18)
Troponin I (High Sensitivity): 7 ng/L (ref <18)

## 2019-06-09 MED ORDER — ASPIRIN 81 MG PO TBEC
81.0000 mg | DELAYED_RELEASE_TABLET | Freq: Every day | ORAL | Status: DC
  Administered 2019-06-10 – 2019-06-11 (×2): 81 mg via ORAL
  Filled 2019-06-09 (×4): qty 1

## 2019-06-09 MED ORDER — ACETAMINOPHEN 325 MG PO TABS: 650.0000 mg | ORAL_TABLET | Freq: Four times a day (QID) | ORAL | Status: DC | PRN

## 2019-06-09 MED ORDER — FAMOTIDINE 20 MG PO TABS: 10.0000 mg | ORAL_TABLET | Freq: Every day | ORAL | Status: DC | PRN

## 2019-06-09 MED ORDER — ENOXAPARIN SODIUM 40 MG/0.4ML ~~LOC~~ SOLN
40.0000 mg | Freq: Two times a day (BID) | SUBCUTANEOUS | Status: DC
  Administered 2019-06-09 – 2019-06-11 (×4): 40 mg via SUBCUTANEOUS
  Filled 2019-06-09 (×4): qty 0.4

## 2019-06-09 MED ORDER — METHYLPREDNISOLONE SODIUM SUCC 40 MG IJ SOLR
40.0000 mg | Freq: Two times a day (BID) | INTRAMUSCULAR | Status: DC
  Administered 2019-06-10: 04:00:00 40 mg via INTRAVENOUS
  Filled 2019-06-09: qty 1

## 2019-06-09 MED ORDER — IPRATROPIUM-ALBUTEROL 0.5-2.5 (3) MG/3ML IN SOLN
3.0000 mL | Freq: Four times a day (QID) | RESPIRATORY_TRACT | Status: DC
  Administered 2019-06-09 – 2019-06-11 (×8): 3 mL via RESPIRATORY_TRACT
  Filled 2019-06-09 (×8): qty 3

## 2019-06-09 MED ORDER — ALBUTEROL SULFATE (2.5 MG/3ML) 0.083% IN NEBU
2.5000 mg | INHALATION_SOLUTION | RESPIRATORY_TRACT | Status: DC | PRN
  Administered 2019-06-11: 2.5 mg via RESPIRATORY_TRACT
  Filled 2019-06-09: qty 3

## 2019-06-09 MED ORDER — GUAIFENESIN-DM 100-10 MG/5ML PO SYRP
5.0000 mL | ORAL_SOLUTION | ORAL | Status: DC | PRN
  Administered 2019-06-09: 5 mL via ORAL
  Filled 2019-06-09: qty 5

## 2019-06-09 MED ORDER — FUROSEMIDE 10 MG/ML IJ SOLN: 20.0000 mg | Freq: Every day | INTRAMUSCULAR | Status: DC

## 2019-06-09 MED ORDER — LOSARTAN POTASSIUM 50 MG PO TABS
50.0000 mg | ORAL_TABLET | Freq: Every day | ORAL | Status: DC
  Administered 2019-06-10 – 2019-06-11 (×2): 50 mg via ORAL
  Filled 2019-06-09 (×2): qty 1

## 2019-06-09 MED ORDER — LEVOTHYROXINE SODIUM 50 MCG PO TABS
150.0000 ug | ORAL_TABLET | Freq: Every day | ORAL | Status: DC
  Administered 2019-06-10 – 2019-06-11 (×2): 150 ug via ORAL
  Filled 2019-06-09 (×2): qty 1

## 2019-06-09 MED ORDER — DOXYCYCLINE HYCLATE 100 MG PO TABS
100.0000 mg | ORAL_TABLET | Freq: Two times a day (BID) | ORAL | Status: DC
  Administered 2019-06-09 – 2019-06-11 (×4): 100 mg via ORAL
  Filled 2019-06-09 (×4): qty 1

## 2019-06-09 MED ORDER — VITAMIN B-12 1000 MCG PO TABS
1000.0000 ug | ORAL_TABLET | Freq: Every day | ORAL | Status: DC
  Administered 2019-06-09 – 2019-06-11 (×3): 1000 ug via ORAL
  Filled 2019-06-09 (×3): qty 1

## 2019-06-09 MED ORDER — FUROSEMIDE 10 MG/ML IJ SOLN
20.0000 mg | Freq: Once | INTRAMUSCULAR | Status: AC
  Administered 2019-06-09: 14:00:00 20 mg via INTRAVENOUS
  Filled 2019-06-09: qty 4

## 2019-06-09 MED ORDER — FUROSEMIDE 10 MG/ML IJ SOLN
20.0000 mg | Freq: Two times a day (BID) | INTRAMUSCULAR | Status: DC
  Administered 2019-06-10 – 2019-06-11 (×3): 20 mg via INTRAVENOUS
  Filled 2019-06-09 (×4): qty 2

## 2019-06-09 MED ORDER — FERROUS SULFATE 325 (65 FE) MG PO TABS
325.0000 mg | ORAL_TABLET | Freq: Every day | ORAL | Status: DC
  Administered 2019-06-10 – 2019-06-11 (×2): 325 mg via ORAL
  Filled 2019-06-09 (×2): qty 1

## 2019-06-09 MED ORDER — IPRATROPIUM-ALBUTEROL 0.5-2.5 (3) MG/3ML IN SOLN
3.0000 mL | Freq: Once | RESPIRATORY_TRACT | Status: AC
  Administered 2019-06-09: 16:00:00 3 mL via RESPIRATORY_TRACT
  Filled 2019-06-09: qty 3

## 2019-06-09 MED ORDER — METHYLPREDNISOLONE SODIUM SUCC 125 MG IJ SOLR
125.0000 mg | Freq: Once | INTRAMUSCULAR | Status: AC
  Administered 2019-06-09: 16:00:00 125 mg via INTRAVENOUS
  Filled 2019-06-09: qty 2

## 2019-06-09 MED ORDER — ACETAMINOPHEN 650 MG RE SUPP: 650.0000 mg | Freq: Four times a day (QID) | RECTAL | Status: DC | PRN

## 2019-06-09 NOTE — Telephone Encounter (Signed)
Spoke with the patient. Patient sts that she has had increased SOB in the last 24 hours. She is on continuous O2 at home. She has on overnight weight gain of 4lbs. Patient sts that she did not sleep last night due to orthopnea. She is also complains of increased bilateral LE edema.  She denies chest pain, palpitations, n/v. Cough, pre-syncope and syncope. Patient does sound sob on the phone. She was considering going to the ED this morning. She is scheduled for Telemedicine visit with Ignacia Bayley, NP this afternoon. She was offered to change her appt to an in office but she declined.  Patient sts that her CNA will be at her home at 10am and can drive her to the ED.  Advised the patient that I agree that she should be evaluated in the ED. Advised her that if her symptoms worsen prior to her CNAs arrival she is to call 911.  Patient verbalized understanding.

## 2019-06-09 NOTE — ED Triage Notes (Signed)
Pt presents to ED via POV, states hx of COPD, emphysema, and CHF. Pt with audible wheezing in triage. Pt states she has fluid buildup around her lungs. Pt c/o increasing SOB starting last night. Pt states unable to eat/drink due to SOB or take her medication. Pt is able to speak in complete sentences upon arrival to ED.

## 2019-06-09 NOTE — H&P (Signed)
History and Physical    [name] [id number] DOB: [birth year] DOA: 06/09/2019  PCP: [name], MD  Patient coming from: home  I have personally briefly reviewed patient's old medical records in [place]  Chief Complaint: wheezing, SOb  HPI: Lauren Bartlett is Lauren Bartlett 64 y.o. female with medical history significant of COPD on 2 L at night, HFpEF, depression, GERD, history of mitral valve repair and multiple other medical problems presenting with shortness of breath.  Her symptoms started about 3 weeks ago.  She's been wheezing.  Notes it improves with steroids, but is unable to take this PO.  Steroids help for Rilynn Habel little.  She noticed swelling in her legs yesterday, worse in left.  She notes albuterol sometimes helps with wheezing.  No fevers, chills, CP.  No N/V.  No sick or covid contacts.  No smoking or etoh.  ED Course: Imaging, EKG, labs, lasix.  Admit for concern for overload vs COPD exacerbation.  Review of Systems: As per HPI otherwise 10 point review of systems negative.   Past Medical History:  Diagnosis Date  . Anxiety   . Bleeding from the nose   . Chronic diastolic congestive heart failure ([place])    Lauren Bartlett. 03/2016 Echo: EF 55-60%, Gr2 DD; b. 08/2016 Echo: EF 60-65%, nl diast fxn; c. 01/2019 Echo: EF 55-60%. Nl RV fxn.  . Congenital absence of one kidney   . COPD (chronic obstructive pulmonary disease) ([place])    Brandan Bartlett. 02/2017 PFT: mod/sev obstructive airway dzs w/ significant bronchodilator response.  . Depression    treated at [facility]  . Dyspnea   . Emphysema/COPD ([place])   . GERD (gastroesophageal reflux disease)   . History of cardiac cath    Lauren Bartlett. 05/2016 Cath: nl cors.  . History of sciatica   . Hypertension   . Hypothyroidism   . Mitral Valve Prolapse & Severe mitral regurgitation s/p MVR    Lauren Bartlett. 03/2016 Echo: severe MVP involving the posterior leaflet, sev MR;  b. 06/2016 min invasive MV repair w/ triangular resection of flail segment (P2) of posterior leaflet,  gore-tex neochord placement x 4, Sorin Memo 3D rin Annulosplasty (58mm, catalog # [id number], ser # [id number]); c. 08/2016 Echo: MV area 2.06 cm^2 (pressure 1/2 time); d. 01/2019 Echo: Mild MS w/ mean grad 69mmHg.  . Motion sickness    cars  . Pneumonia   . PONV (postoperative nausea and vomiting)   . Post-op Afib    Lauren Bartlett. 06/2016 following MV repair-->short-course amio. Coumadin d/c'd 2/2 epistaxis.  . Pulmonary hypertension ([place])    Lauren Bartlett. 08/2016 Echo: PASP 43mmHg; b. 01/2019 Echo: RVSP 45.18mmHg.  . S/P patent foramen ovale closure    Lauren Bartlett. 06/2016 @ time of MV Repair.  . Sleep apnea    O2 at night and PRN    Past Surgical History:  Procedure Laterality Date  . ABDOMINAL HYSTERECTOMY    . BREAST BIOPSY Right 2011   [facility]< benign  . CATARACT EXTRACTION W/PHACO Left 11/14/2015   Procedure: CATARACT EXTRACTION PHACO AND INTRAOCULAR LENS PLACEMENT (IOC);  Surgeon: Leandrew Koyanagi, MD;  Location: [facility];  Service: Ophthalmology;  Laterality: Left;  sleep apnea Tor[place]  . CATARACT EXTRACTION W/PHACO Right 12/19/2015   Procedure: CATARACT EXTRACTION PHACO AND INTRAOCULAR LENS PLACEMENT (IOC);  Surgeon: Leandrew Koyanagi, MD;  Location: [facility];  Service: Ophthalmology;  Laterality: Right;  ANXIETY GENEROUS IV SEDATION TORIC LEN  . COMBINED HYSTERECTOMY ABDOMINAL W/ Truman Aceituno&P REPAIR / OOPHORECTOMY  1996  benign tumor  . INNER EAR SURGERY     bilateral  . MITRAL VALVE REPAIR Right 07/17/2016   Procedure: MINIMALLY INVASIVE MITRAL VALVE REPAIR (MVR) USING 26 SORIN MEMO 3D ANNULOPLASTY RING;  Surgeon: Rexene Alberts, MD;  Location: [facility];  Service: Open Heart Surgery;  Laterality: Right;  . PATENT FORAMEN OVALE(PFO) CLOSURE N/Rowen Hur 07/17/2016   Procedure: PATENT FORAMEN OVALE (PFO) CLOSURE;  Surgeon: Rexene Alberts, MD;  Location: [facility];  Service: Open Heart Surgery;  Laterality: N/Jamari Diana;  . RIGHT/LEFT HEART CATH AND CORONARY ANGIOGRAPHY Bilateral 06/16/2016   Procedure: Right/Left Heart Cath and  Coronary Angiography;  Surgeon: Wellington Hampshire, MD;  Location: [facility];  Service: Cardiovascular;  Laterality: Bilateral;  . TEE WITHOUT CARDIOVERSION N/Delainy Mcelhiney 05/19/2016   Procedure: TRANSESOPHAGEAL ECHOCARDIOGRAM (TEE);  Surgeon: Wellington Hampshire, MD;  Location: [facility];  Service: Cardiovascular;  Laterality: N/Caylen Kuwahara;  . TEE WITHOUT CARDIOVERSION N/Josemiguel Gries 06/09/2016   Procedure: TRANSESOPHAGEAL ECHOCARDIOGRAM (TEE);  Surgeon: Wellington Hampshire, MD;  Location: [facility];  Service: Cardiovascular;  Laterality: N/Hayzel Ruberg;  . TEE WITHOUT CARDIOVERSION N/Ashland Wiseman 06/16/2016   Procedure: TRANSESOPHAGEAL ECHOCARDIOGRAM (TEE);  Surgeon: Wellington Hampshire, MD;  Location: [facility];  Service: Cardiovascular;  Laterality: N/Keijuan Schellhase;  . TEE WITHOUT CARDIOVERSION N/Taelor Moncada 07/17/2016   Procedure: TRANSESOPHAGEAL ECHOCARDIOGRAM (TEE);  Surgeon: Rexene Alberts, MD;  Location: [place];  Service: Open Heart Surgery;  Laterality: N/Loveta Dellis;  . TONSILLECTOMY       reports that she has never smoked. She has never used smokeless tobacco. She reports that she does not drink alcohol or use drugs.  Allergies  Allergen Reactions  . Codeine Hives and Nausea And Vomiting  . Levaquin [Levofloxacin] Nausea And Vomiting  . Penicillins Hives, Itching and Other (See Comments)    Has patient had Danely Bayliss PCN reaction causing immediate rash, facial/tongue/throat swelling, SOB or lightheadedness with hypotension: No Has patient had Peyson Delao PCN reaction causing severe rash involving mucus membranes or skin necrosis: No Has patient had Evander Macaraeg PCN reaction that required hospitalization No Has patient had Kalika Smay PCN reaction occurring within the last 10 years: No If all of the above answers are "NO", then may proceed with Cephalosporin use.  [name] Zithromax [Azithromycin] Other (See Comments)    Reaction:  Hallucinations   . Prednisone Palpitations and Other (See Comments)    Reaction:  Hallucinations  Can take injections but not oral meds    Family History  Problem Relation Age of  Onset  . Multiple sclerosis Mother   . Hypertension Mother   . Coronary artery disease Father   . Heart disease Father   . Hypertension Father   . Heart disease Brother   . Cancer Brother   . Breast cancer Paternal Aunt    Prior to Admission medications   Medication Sig Start Date End Date Taking? Authorizing Provider  aspirin EC 81 MG EC tablet Take 1 tablet (81 mg total) by mouth daily. 07/23/16  Yes Barrett, Erin R, PA-C  ergocalciferol (DRISDOL) 1.25 MG (50000 UT) capsule Take 1 capsule (50,000 Units total) by mouth once Dustan Hyams week. 02/15/19  Yes [name], MD  ferrous sulfate 325 (65 FE) MG tablet Take 325 mg by mouth daily with breakfast.   Yes [provider]  furosemide (LASIX) 20 MG tablet Take 1 tablet (20 mg total) by mouth 2 (two) times daily. 06/02/19  Yes Theora Gianotti, NP  levothyroxine (SYNTHROID) 150 MCG tablet Take 1 tablet (150 mcg total) by mouth daily  before breakfast. 04/14/19  Yes [name], MD  losartan (COZAAR) 50 MG tablet TAKE 1 TABLET EVERY DAY 01/14/19  Yes [name], MD  Tiotropium Bromide-Olodaterol 2.5-2.5 MCG/ACT AERS Inhale 2 puffs into the lungs daily. 05/25/19  Yes [name], MD  vitamin B-12 (CYANOCOBALAMIN) 1000 MCG tablet Take 1 tablet (1,000 mcg total) by mouth daily. 08/14/16  Yes [name], MD  acetaminophen (TYLENOL) 500 MG tablet Take 500 mg by mouth 2 (two) times daily as needed for moderate pain or headache.    [provider]  albuterol (PROVENTIL) (2.5 MG/3ML) 0.083% nebulizer solution Take 3 mLs (2.5 mg total) by nebulization every 6 (six) hours as needed for wheezing or shortness of breath. 05/29/19   Lorin Picket, PA-C  doxycycline (VIBRA-TABS) 100 MG tablet Take 1 tablet (100 mg total) by mouth 2 (two) times daily. Patient not taking: Reported on 06/09/2019 05/21/19   Norval Gable, MD  famotidine (PEPCID AC) 10 MG chewable tablet Chew 10 mg by mouth daily as needed for heartburn.     [provider]  fluticasone (FLONASE) 50 MCG/ACT nasal spray USE 2 SPRAYS IN EACH NOSTRIL EVERY DAY 01/10/19   [name], MD  loratadine-pseudoephedrine (CLARITIN-D 24-HOUR) 10-240 MG 24 hr tablet Take 1 tablet by mouth daily.    [provider]  OXYGEN Inhale 2 L into the lungs at bedtime as needed (at bedtime and as needed).    [provider]  oxymetazoline (AFRIN) 0.05 % nasal spray Place 1 spray into both nostrils 2 (two) times daily. As needed for nose bleed    [provider]  polyethylene glycol powder (GLYCOLAX/MIRALAX) powder Take 17 g by mouth 2 (two) times daily as needed. Patient taking differently: Take 17 g by mouth daily as needed for mild constipation.  08/03/15   [name], MD  traMADol (ULTRAM) 50 MG tablet Take 1-2 tablets (50-100 mg total) by mouth every 4 (four) hours as needed for moderate pain. Patient not taking: Reported on 06/09/2019 08/21/16   [name], MD    Physical Exam: Vitals:   06/09/19 1330 06/09/19 1356 06/09/19 1540 06/09/19 1759  BP:   104/85 135/80  Pulse: 84  91 86  Resp: 15  18 17   Temp:    97.8 F (36.6 C)  TempSrc:      SpO2: 100% 98% 100% 100%  Weight:    115.8 kg  Height:    5\' 2"  (1.575 m)    Constitutional: NAD, calm, comfortable Vitals:   06/09/19 1330 06/09/19 1356 06/09/19 1540 06/09/19 1759  BP:   104/85 135/80  Pulse: 84  91 86  Resp: 15  18 17   Temp:    97.8 F (36.6 C)  TempSrc:      SpO2: 100% 98% 100% 100%  Weight:    115.8 kg  Height:    5\' 2"  (1.575 m)   Eyes: PERRL, lids and conjunctivae normal ENMT: Mucous membranes are moist. Posterior pharynx clear of any exudate or lesions.Normal dentition.  Neck: normal, supple, no masses, no thyromegaly Respiratory: diffuse wheezing Cardiovascular: Regular rate and rhythm, no murmurs / rubs / gallops.  Abdomen: no tenderness, no masses palpated. No hepatosplenomegaly. Bowel sounds positive.  Musculoskeletal: bilateral LE  edema Skin: no rashes, lesions, ulcers. No induration Neurologic: CN 2-12 grossly intact. Sensation intact, DTR normal. Strength 5/5 in all 4.  Psychiatric: Normal judgment and insight. Alert and oriented x 3. Normal mood.   Labs  on Admission: I have personally reviewed following labs and imaging studies  CBC: Recent Labs  Lab 06/09/19 1136  WBC 7.8  NEUTROABS 5.5  HGB 13.9  HCT 44.0  MCV 96.5  PLT [id number]   Basic Metabolic Panel: Recent Labs  Lab 06/09/19 1136  NA 137  K 4.0  CL 100  CO2 27  GLUCOSE 93  BUN 12  CREATININE 0.75  CALCIUM 7.8*   GFR: Estimated Creatinine Clearance: 86.8 mL/min (by C-G formula based on SCr of 0.75 mg/dL). Liver Function Tests: Recent Labs  Lab 06/09/19 1136  AST 18  ALT 19  ALKPHOS 59  BILITOT 0.9  PROT 7.5  ALBUMIN 4.1   No results for input(s): LIPASE, AMYLASE in the last 168 hours. No results for input(s): AMMONIA in the last 168 hours. Coagulation Profile: No results for input(s): INR, PROTIME in the last 168 hours. Cardiac Enzymes: No results for input(s): CKTOTAL, CKMB, CKMBINDEX, TROPONINI in the last 168 hours. BNP (last 3 results) No results for input(s): PROBNP in the last 8760 hours. HbA1C: No results for input(s): HGBA1C in the last 72 hours. CBG: No results for input(s): GLUCAP in the last 168 hours. Lipid Profile: No results for input(s): CHOL, HDL, LDLCALC, TRIG, CHOLHDL, LDLDIRECT in the last 72 hours. Thyroid Function Tests: No results for input(s): TSH, T4TOTAL, FREET4, T3FREE, THYROIDAB in the last 72 hours. Anemia Panel: No results for input(s): VITAMINB12, FOLATE, FERRITIN, TIBC, IRON, RETICCTPCT in the last 72 hours. Urine analysis:    Component Value Date/Time   COLORURINE YELLOW (Jadyn Barge) 10/20/2016 2051   APPEARANCEUR CLEAR (Rudell Marlowe) 10/20/2016 2051   APPEARANCEUR Clear 03/09/2013 1636   LABSPEC 1.011 10/20/2016 2051   LABSPEC 1.005 03/09/2013 1636   PHURINE 6.0 10/20/2016 2051   GLUCOSEU NEGATIVE  10/20/2016 2051   GLUCOSEU Negative 03/09/2013 1636   HGBUR NEGATIVE 10/20/2016 2051   BILIRUBINUR negative 09/29/2017 0900   BILIRUBINUR Negative 03/09/2013 1636   KETONESUR negative 09/29/2017 0900   KETONESUR NEGATIVE 10/20/2016 2051   PROTEINUR negative 09/29/2017 0900   PROTEINUR NEGATIVE 10/20/2016 2051   UROBILINOGEN 0.2 09/29/2017 0900   NITRITE Negative 09/29/2017 0900   NITRITE NEGATIVE 10/20/2016 2051   LEUKOCYTESUR Small (1+) (Kort Stettler) 09/29/2017 0900   LEUKOCYTESUR Negative 03/09/2013 1636    Radiological Exams on Admission: DG Chest Port 1 View  Result Date: 06/09/2019 CLINICAL DATA:  Shortness of breath. Wheezing. EXAM: PORTABLE CHEST 1 VIEW COMPARISON:  Radiograph 05/29/2019 FINDINGS: Chronic elevation of right hemidiaphragm with adjacent atelectasis or scarring. Mild cardiomegaly, stable from prior. Prosthetic cardiac valve. Unchanged mediastinal contours. Mild vascular congestion without overt pulmonary edema. Subsegmental atelectasis at the left lung base. No acute airspace disease. No large pleural effusion. No pneumothorax. No acute osseous abnormalities are seen. IMPRESSION: 1. Vascular congestion. Mild cardiomegaly stable. 2. Chronic elevation of right hemidiaphragm with adjacent atelectasis or scarring. Electronically Signed   By: Keith Rake M.D.   On: 06/09/2019 12:35    EKG: Independently reviewed. NSR  Assessment/Plan Active Problems:   Wheezing  Acute Hypoxic Respiratory Failure 2/2 COPD Exacerbation  COPD with Chronic O2 Requirement, on 2 L at night:  Wheezing noted on exam, suspect 2/2 COPD exacerbation Steroids, duonebs, prn albuterol, doxycycline CXR with concern for vascular congestion IS, flutter Negative COVID and influenza testing  Lower extremity swelling  History of HFpEF: concern for volume overload.  Normal BNP.  CXR with vascular congestion.  Follow LE US Echo with EF 0000000, normal systolic function ([id number]) Will follow responsive  with  IV lasix I/O, daily weights  Hypertension: continue losartan, lasix via IV  Hypothyroidism: continue synthroid  GERD: pepcid  Hx Mitral Valve Repair for MVP and Severe Regurgitation: noted, 2018  DVT prophylaxis: lovenox  Code Status: full  Family Communication: none at bedside  Disposition Plan: pending further improvement  Consults called: none  Admission status: inpatient    Fayrene Helper MD [facility] Pager AMION  If 7PM-7AM, please contact night-coverage [email or web address] Use universal [place] password for that web site. If you do not have the password, please call the hospital operator.  06/09/2019, 6:08 PM

## 2019-06-09 NOTE — Progress Notes (Signed)
Anticoagulation monitoring(Lovenox):  64 yo  female ordered Lovenox 40 mg Q24h  Filed Weights   06/09/19 1124 06/09/19 1759  Weight: 260 lb (117.9 kg) 255 lb 4.8 oz (115.8 kg)   BMI  46.7   Lab Results  Component Value Date   CREATININE 0.75 06/09/2019   CREATININE 1.00 04/13/2019   CREATININE 0.99 01/26/2019   Estimated Creatinine Clearance: 86.8 mL/min (by C-G formula based on SCr of 0.75 mg/dL). Hemoglobin & Hematocrit     Component Value Date/Time   HGB 13.9 06/09/2019 1136   HGB 11.7 05/13/2016 1442   HCT 44.0 06/09/2019 1136   HCT 36.5 05/13/2016 1442     Per Protocol for Patient with estCrcl > 30 ml/min and BMI > 40, will transition to Lovenox 40 mg Q12h.

## 2019-06-09 NOTE — ED Provider Notes (Signed)
[facility] Emergency Department Provider Note  ____________________________________________   First MD Initiated Contact with Patient 06/09/19 1153     (approximate)  I have reviewed the triage vital signs and the nursing notes.  History  Chief Complaint Shortness of Breath    HPI Lauren Bartlett is a 64 y.o. female with hx of dHF, COPD, wears oxygen nightly, s/p MVR, pHTN, pAF who presents to the ED for acute on chronic SOB and wheezing. Patient states over the last several weeks she has had increasing SOB, constant since onset. Over the last 24 hours it has significantly worsened. No identifiable alleviating components. She believes she is retaining fluid as she has developed some new LE edema and admits to non-compliance with her diet, which she suspects is worsening her symptoms. Cough productive of some whitish fluid. No chest pain. No fevers, nausea, vomiting.     Past Medical Hx Past Medical History:  Diagnosis Date  . Anxiety   . Bleeding from the nose   . Chronic diastolic congestive heart failure ([place])    a. 03/2016 Echo: EF 55-60%, Gr2 DD; b. 08/2016 Echo: EF 60-65%, nl diast fxn; c. 01/2019 Echo: EF 55-60%. Nl RV fxn.  . Congenital absence of one kidney   . COPD (chronic obstructive pulmonary disease) ([place])    a. 02/2017 PFT: mod/sev obstructive airway dzs w/ significant bronchodilator response.  . Depression    treated at [place]  . Dyspnea   . Emphysema/COPD ([place])   . GERD (gastroesophageal reflux disease)   . History of cardiac cath    a. 05/2016 Cath: nl cors.  . History of sciatica   . Hypertension   . Hypothyroidism   . Mitral Valve Prolapse & Severe mitral regurgitation s/p MVR    a. 03/2016 Echo: severe MVP involving the posterior leaflet, sev MR;  b. 06/2016 min invasive MV repair w/ triangular resection of flail segment (P2) of posterior leaflet, gore-tex neochord placement x 4, Sorin Memo 3D rin Annulosplasty (73mm, catalog #  [id number], ser # [id number]); c. 08/2016 Echo: MV area 2.06 cm^2 (pressure 1/2 time); d. 01/2019 Echo: Mild MS w/ mean grad 44mmHg.  . Motion sickness    cars  . Pneumonia   . PONV (postoperative nausea and vomiting)   . Post-op Afib    a. 06/2016 following MV repair-->short-course amio. Coumadin d/c'd 2/2 epistaxis.  . Pulmonary hypertension ([place])    a. 08/2016 Echo: PASP 70mmHg; b. 01/2019 Echo: RVSP 45.52mmHg.  . S/P patent foramen ovale closure    a. 06/2016 @ time of MV Repair.  . Sleep apnea    O2 at night and PRN    Problem List Patient Active Problem List   Diagnosis Date Noted  . Dissection of thoracic aorta ([place]) 12/18/2018  . PAF (paroxysmal atrial fibrillation) ([place]) 12/18/2018  . Nocturnal hypoxemia due to obstructive chronic bronchitis ([place]) 02/06/2018  . Grief reaction with prolonged bereavement 08/01/2017  . Encounter for therapeutic drug monitoring 08/13/2016  . S/P minimally invasive mitral valve repair 07/17/2016  . S/P patent foramen ovale closure 07/17/2016  . Chronic diastolic congestive heart failure ([place])   . COPD (chronic obstructive pulmonary disease) ([place]) 05/19/2016  . 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  . Vitamin D deficiency 01/11/2015  . Allergic rhinitis 07/09/2014  . S/P hysterectomy with oophorectomy 09/22/2013  . Pulmonary hypertension ([place])   . Hypocalcemia 09/10/2012  . Hypothyroidism 05/04/2012  . Obesity (BMI 30-39.9) 08/04/2011  . Hypertension   . Congenital absence of one  kidney   . History of sciatica     Past Surgical Hx Past Surgical History:  Procedure Laterality Date  . ABDOMINAL HYSTERECTOMY    . BREAST BIOPSY Right 2011   [facility]< benign  . CATARACT EXTRACTION W/PHACO Left 11/14/2015   Procedure: CATARACT EXTRACTION PHACO AND INTRAOCULAR LENS PLACEMENT (IOC);  Surgeon: Leandrew Koyanagi, MD;  Location: [place];  Service: Ophthalmology;  Laterality: Left;  sleep apnea [place]  . CATARACT EXTRACTION W/PHACO Right 12/19/2015   Procedure:  CATARACT EXTRACTION PHACO AND INTRAOCULAR LENS PLACEMENT (IOC);  Surgeon: Leandrew Koyanagi, MD;  Location: [place];  Service: Ophthalmology;  Laterality: Right;  ANXIETY GENEROUS IV SEDATION TORIC LEN  . COMBINED HYSTERECTOMY ABDOMINAL W/ A&P REPAIR / OOPHORECTOMY  1996   benign tumor  . INNER EAR SURGERY     bilateral  . MITRAL VALVE REPAIR Right 07/17/2016   Procedure: MINIMALLY INVASIVE MITRAL VALVE REPAIR (MVR) USING 26 SORIN MEMO 3D ANNULOPLASTY RING;  Surgeon: Rexene Alberts, MD;  Location: [place];  Service: Open Heart Surgery;  Laterality: Right;  . PATENT FORAMEN OVALE(PFO) CLOSURE N/A 07/17/2016   Procedure: PATENT FORAMEN OVALE (PFO) CLOSURE;  Surgeon: Rexene Alberts, MD;  Location: [place];  Service: Open Heart Surgery;  Laterality: N/A;  . RIGHT/LEFT HEART CATH AND CORONARY ANGIOGRAPHY Bilateral 06/16/2016   Procedure: Right/Left Heart Cath and Coronary Angiography;  Surgeon: Wellington Hampshire, MD;  Location: [place] [facility];  Service: Cardiovascular;  Laterality: Bilateral;  . TEE WITHOUT CARDIOVERSION N/A 05/19/2016   Procedure: TRANSESOPHAGEAL ECHOCARDIOGRAM (TEE);  Surgeon: Wellington Hampshire, MD;  Location: [facility];  Service: Cardiovascular;  Laterality: N/A;  . TEE WITHOUT CARDIOVERSION N/A 06/09/2016   Procedure: TRANSESOPHAGEAL ECHOCARDIOGRAM (TEE);  Surgeon: Wellington Hampshire, MD;  Location: [facility];  Service: Cardiovascular;  Laterality: N/A;  . TEE WITHOUT CARDIOVERSION N/A 06/16/2016   Procedure: TRANSESOPHAGEAL ECHOCARDIOGRAM (TEE);  Surgeon: Wellington Hampshire, MD;  Location: [facility];  Service: Cardiovascular;  Laterality: N/A;  . TEE WITHOUT CARDIOVERSION N/A 07/17/2016   Procedure: TRANSESOPHAGEAL ECHOCARDIOGRAM (TEE);  Surgeon: Rexene Alberts, MD;  Location: [place];  Service: Open Heart Surgery;  Laterality: N/A;  . TONSILLECTOMY      Medications Prior to Admission medications   Medication Sig Start Date End Date Taking? Authorizing Provider  aspirin EC  81 MG EC tablet Take 1 tablet (81 mg total) by mouth daily. 07/23/16  Yes Barrett, Erin R, PA-C  ergocalciferol (DRISDOL) 1.25 MG (50000 UT) capsule Take 1 capsule (50,000 Units total) by mouth once a week. 02/15/19  Yes [name], MD  ferrous sulfate 325 (65 FE) MG tablet Take 325 mg by mouth daily with breakfast.   Yes [provider]  furosemide (LASIX) 20 MG tablet Take 1 tablet (20 mg total) by mouth 2 (two) times daily. 06/02/19  Yes Theora Gianotti, NP  levothyroxine (SYNTHROID) 150 MCG tablet Take 1 tablet (150 mcg total) by mouth daily before breakfast. 04/14/19  Yes [name], MD  losartan (COZAAR) 50 MG tablet TAKE 1 TABLET EVERY DAY 01/14/19  Yes [name], MD  Tiotropium Bromide-Olodaterol 2.5-2.5 MCG/ACT AERS Inhale 2 puffs into the lungs daily. 05/25/19  Yes [name], MD  vitamin B-12 (CYANOCOBALAMIN) 1000 MCG tablet Take 1 tablet (1,000 mcg total) by mouth daily. 08/14/16  Yes [name], MD  acetaminophen (TYLENOL) 500 MG tablet Take 500 mg by mouth 2 (two) times daily as needed for moderate pain or  headache.    [provider]  albuterol (PROVENTIL) (2.5 MG/3ML) 0.083% nebulizer solution Take 3 mLs (2.5 mg total) by nebulization every 6 (six) hours as needed for wheezing or shortness of breath. 05/29/19   Lorin Picket, PA-C  doxycycline (VIBRA-TABS) 100 MG tablet Take 1 tablet (100 mg total) by mouth 2 (two) times daily. Patient not taking: Reported on 06/09/2019 05/21/19   Norval Gable, MD  famotidine (PEPCID AC) 10 MG chewable tablet Chew 10 mg by mouth daily as needed for heartburn.    [provider]  fluticasone (FLONASE) 50 MCG/ACT nasal spray USE 2 SPRAYS IN EACH NOSTRIL EVERY DAY 01/10/19   [name], MD  loratadine-pseudoephedrine (CLARITIN-D 24-HOUR) 10-240 MG 24 hr tablet Take 1 tablet by mouth daily.    [provider]  OXYGEN Inhale 2 L into the lungs at bedtime as needed (at bedtime and as  needed).    [provider]  oxymetazoline (AFRIN) 0.05 % nasal spray Place 1 spray into both nostrils 2 (two) times daily. As needed for nose bleed    [provider]  polyethylene glycol powder (GLYCOLAX/MIRALAX) powder Take 17 g by mouth 2 (two) times daily as needed. Patient taking differently: Take 17 g by mouth daily as needed for mild constipation.  08/03/15   [name], MD  traMADol (ULTRAM) 50 MG tablet Take 1-2 tablets (50-100 mg total) by mouth every 4 (four) hours as needed for moderate pain. Patient not taking: Reported on 06/09/2019 08/21/16   [name], MD    Allergies Codeine, Levaquin [levofloxacin], Penicillins, Zithromax [azithromycin], and Prednisone  Family Hx Family History  Problem Relation Age of Onset  . Multiple sclerosis Mother   . Hypertension Mother   . Coronary artery disease Father   . Heart disease Father   . Hypertension Father   . Heart disease Brother   . Cancer Brother   . Breast cancer Paternal Aunt     Social Hx Social History   Tobacco Use  . Smoking status: Never Smoker  . Smokeless tobacco: Never Used  Substance Use Topics  . Alcohol use: No  . Drug use: No     Review of Systems  Constitutional: Negative for fever, chills. Eyes: Negative for visual changes. ENT: Negative for sore throat. Cardiovascular: Negative for chest pain. Respiratory: + for shortness of breath. Gastrointestinal: Negative for nausea, vomiting.  Genitourinary: Negative for dysuria. Musculoskeletal: + for leg swelling. Skin: Negative for rash. Neurological: Negative for headaches.   Physical Exam  Vital Signs: ED Triage Vitals  Enc Vitals Group     BP 06/09/19 1124 108/82     Pulse Rate 06/09/19 1124 93     Resp 06/09/19 1132 (!) 28     Temp 06/09/19 1124 97.7 F (36.5 C)     Temp Source 06/09/19 1124 Oral     SpO2 06/09/19 1124 100 %     Weight 06/09/19 1124 260 lb (117.9 kg)     Height 06/09/19 1124 5\' 2"  (1.575 m)       Head Circumference --      Peak Flow --      Pain Score 06/09/19 1130 0     Pain Loc --      Pain Edu? --      Excl. in [place]? --     Constitutional: Alert and oriented. Chronically ill appearing. Appears older than stated age. Head: Normocephalic. Atraumatic. Eyes: Conjunctivae clear, sclera anicteric. Pupils equal and symmetric.  Nose: No masses or lesions. No congestion or rhinorrhea. Mouth/Throat: Wearing mask.  Neck: No stridor. Trachea midline.  Cardiovascular: Normal rate, regular rhythm. Extremities well perfused. Respiratory: Slight tachypnea. Wheezing bilaterally. On 2-3 L [place], does not normally wear oxygen during day.  Gastrointestinal: Soft. Non-distended. Non-tender.  Genitourinary: Deferred. Musculoskeletal: Symmetric pitting edema to proximal ankle.  Neurologic:  Normal speech and language. No gross focal or lateralizing neurologic deficits are appreciated.  Skin: Skin is warm, dry and intact. No rash noted. Psychiatric: Mood and affect are appropriate for situation.  EKG  Personally reviewed and interpreted by myself.   Rate: 89 Rhythm: sinus Axis: normal Intervals: WNL TWI in aVL No STEMI    Radiology  CXR: IMPRESSION:  1. Vascular congestion. Mild cardiomegaly stable.  2. Chronic elevation of right hemidiaphragm with adjacent  atelectasis or scarring.    Procedures  Procedure(s) performed (including critical care):  Procedures   Initial Impression / Assessment and Plan / MDM / ED Course  64 y.o. female who presents to the ED for SOB, wheezing, LE edema  Ddx: HF exacerbation, COPD exacerbation, PNA, pulmonary infection, worsening pHTN. Likely combination of several.   Will evaluate with labs, VBG, imaging, EKG  Labs fairly unremarkable. VBG without acidosis and minimal hypercarbia. XR with vascular congestion. Good response to Lasix, but still with fair amount of wheezing. Will administer steroids and nebulizer for likely concomitant COPD.  Given new day time oxygen requirement will admit for continued treatment. Patient agreeable. Discussed w/ hospitalist for admission.     _______________________________  As part of my medical decision making I have reviewed available labs, radiology tests, reviewed old records.  Final Clinical Impression(s) / ED Diagnosis  Final diagnoses:  SOB (shortness of breath)  Wheezing       Note:  This document was prepared using Dragon voice recognition software and may include unintentional dictation errors.   Lilia Pro., MD 06/09/19 1550

## 2019-06-09 NOTE — ED Notes (Signed)
Date and time results received: 06/09/19 2:41 PM (use smartphrase ".now" to insert current time)  Test: pO2 Critical Value: 20  Name of Provider Notified: Dr. Joan Mayans  Orders Received? Or Actions Taken?: no new orders at this time

## 2019-06-09 NOTE — Telephone Encounter (Signed)
Pt c/o swelling: STAT is pt has developed SOB within 24 hours  1) How much weight have you gained and in what time span? unsure  2) If swelling, where is the swelling located? Legs and ankles  3) Are you currently taking a fluid pill? yes  4) Are you currently SOB? yes  5) Do you have a log of your daily weights (if so, list)?   6) Have you gained 3 pounds in a day or 5 pounds in a week? Yes, 3 lbs one day  7) Have you traveled recently? No  Patient is very emotional and has not been able to rest. Patient is having difficulty breathing as well.    Patient has a virtual visit with Sharolyn Douglas today at 1:55, patient nurse will not be able to bring her that late. Patient stating she might have to go to the ED

## 2019-06-10 ENCOUNTER — Observation Stay: Payer: Medicare HMO

## 2019-06-10 DIAGNOSIS — E8770 Fluid overload, unspecified: Secondary | ICD-10-CM | POA: Diagnosis present

## 2019-06-10 DIAGNOSIS — J439 Emphysema, unspecified: Secondary | ICD-10-CM | POA: Diagnosis present

## 2019-06-10 DIAGNOSIS — Z90710 Acquired absence of both cervix and uterus: Secondary | ICD-10-CM | POA: Diagnosis not present

## 2019-06-10 DIAGNOSIS — F329 Major depressive disorder, single episode, unspecified: Secondary | ICD-10-CM | POA: Diagnosis present

## 2019-06-10 DIAGNOSIS — R062 Wheezing: Secondary | ICD-10-CM | POA: Diagnosis present

## 2019-06-10 DIAGNOSIS — Z7982 Long term (current) use of aspirin: Secondary | ICD-10-CM | POA: Diagnosis not present

## 2019-06-10 DIAGNOSIS — I272 Pulmonary hypertension, unspecified: Secondary | ICD-10-CM | POA: Diagnosis present

## 2019-06-10 DIAGNOSIS — J9601 Acute respiratory failure with hypoxia: Secondary | ICD-10-CM | POA: Diagnosis present

## 2019-06-10 DIAGNOSIS — K219 Gastro-esophageal reflux disease without esophagitis: Secondary | ICD-10-CM | POA: Diagnosis present

## 2019-06-10 DIAGNOSIS — J449 Chronic obstructive pulmonary disease, unspecified: Secondary | ICD-10-CM | POA: Diagnosis not present

## 2019-06-10 DIAGNOSIS — Z961 Presence of intraocular lens: Secondary | ICD-10-CM | POA: Diagnosis present

## 2019-06-10 DIAGNOSIS — I110 Hypertensive heart disease with heart failure: Secondary | ICD-10-CM | POA: Diagnosis present

## 2019-06-10 DIAGNOSIS — Z8774 Personal history of (corrected) congenital malformations of heart and circulatory system: Secondary | ICD-10-CM | POA: Diagnosis not present

## 2019-06-10 DIAGNOSIS — Z9841 Cataract extraction status, right eye: Secondary | ICD-10-CM | POA: Diagnosis not present

## 2019-06-10 DIAGNOSIS — G4730 Sleep apnea, unspecified: Secondary | ICD-10-CM | POA: Diagnosis present

## 2019-06-10 DIAGNOSIS — Z881 Allergy status to other antibiotic agents status: Secondary | ICD-10-CM | POA: Diagnosis not present

## 2019-06-10 DIAGNOSIS — E039 Hypothyroidism, unspecified: Secondary | ICD-10-CM | POA: Diagnosis present

## 2019-06-10 DIAGNOSIS — Z20822 Contact with and (suspected) exposure to covid-19: Secondary | ICD-10-CM | POA: Diagnosis present

## 2019-06-10 DIAGNOSIS — I5032 Chronic diastolic (congestive) heart failure: Secondary | ICD-10-CM | POA: Diagnosis present

## 2019-06-10 DIAGNOSIS — Z880 Allergy status to penicillin: Secondary | ICD-10-CM | POA: Diagnosis not present

## 2019-06-10 DIAGNOSIS — J441 Chronic obstructive pulmonary disease with (acute) exacerbation: Secondary | ICD-10-CM | POA: Diagnosis present

## 2019-06-10 DIAGNOSIS — M7989 Other specified soft tissue disorders: Secondary | ICD-10-CM | POA: Diagnosis present

## 2019-06-10 DIAGNOSIS — Z91018 Allergy to other foods: Secondary | ICD-10-CM | POA: Diagnosis not present

## 2019-06-10 DIAGNOSIS — R600 Localized edema: Secondary | ICD-10-CM | POA: Diagnosis not present

## 2019-06-10 DIAGNOSIS — Z952 Presence of prosthetic heart valve: Secondary | ICD-10-CM | POA: Diagnosis not present

## 2019-06-10 DIAGNOSIS — Z885 Allergy status to narcotic agent status: Secondary | ICD-10-CM | POA: Diagnosis not present

## 2019-06-10 DIAGNOSIS — Z9842 Cataract extraction status, left eye: Secondary | ICD-10-CM | POA: Diagnosis not present

## 2019-06-10 DIAGNOSIS — I480 Paroxysmal atrial fibrillation: Secondary | ICD-10-CM | POA: Diagnosis present

## 2019-06-10 DIAGNOSIS — R0602 Shortness of breath: Secondary | ICD-10-CM | POA: Diagnosis not present

## 2019-06-10 DIAGNOSIS — Z8249 Family history of ischemic heart disease and other diseases of the circulatory system: Secondary | ICD-10-CM | POA: Diagnosis not present

## 2019-06-10 LAB — CBC
HCT: 45 % (ref 36.0–46.0)
Hemoglobin: 14.1 g/dL (ref 12.0–15.0)
MCH: 29.9 pg (ref 26.0–34.0)
MCHC: 31.3 g/dL (ref 30.0–36.0)
MCV: 95.5 fL (ref 80.0–100.0)
Platelets: 163 10*3/uL (ref 150–400)
RBC: 4.71 MIL/uL (ref 3.87–5.11)
RDW: 13.6 % (ref 11.5–15.5)
WBC: 6 10*3/uL (ref 4.0–10.5)
nRBC: 0 % (ref 0.0–0.2)

## 2019-06-10 LAB — COMPREHENSIVE METABOLIC PANEL
ALT: 19 U/L (ref 0–44)
AST: 19 U/L (ref 15–41)
Albumin: 3.8 g/dL (ref 3.5–5.0)
Alkaline Phosphatase: 62 U/L (ref 38–126)
Anion gap: 9 (ref 5–15)
BUN: 18 mg/dL (ref 8–23)
CO2: 28 mmol/L (ref 22–32)
Calcium: 7.5 mg/dL — ABNORMAL LOW (ref 8.9–10.3)
Chloride: 103 mmol/L (ref 98–111)
Creatinine, Ser: 0.82 mg/dL (ref 0.44–1.00)
GFR calc Af Amer: >60 mL/min (ref >60)
GFR calc non Af Amer: >60 mL/min (ref >60)
Glucose, Bld: 111 mg/dL — ABNORMAL HIGH (ref 70–99)
Potassium: 4.4 mmol/L (ref 3.5–5.1)
Sodium: 140 mmol/L (ref 135–145)
Total Bilirubin: 0.8 mg/dL (ref 0.3–1.2)
Total Protein: 7.3 g/dL (ref 6.5–8.1)

## 2019-06-10 LAB — HIV ANTIBODY (ROUTINE TESTING W REFLEX): HIV Screen 4th Generation wRfx: NONREACTIVE

## 2019-06-10 MED ORDER — DEXAMETHASONE SODIUM PHOSPHATE 10 MG/ML IJ SOLN
10.0000 mg | INTRAMUSCULAR | Status: DC
  Administered 2019-06-10: 17:00:00 10 mg via INTRAVENOUS
  Filled 2019-06-10: qty 1

## 2019-06-10 MED ORDER — SODIUM CHLORIDE 0.9% FLUSH
10.0000 mL | Freq: Two times a day (BID) | INTRAVENOUS | Status: DC
  Administered 2019-06-10 – 2019-06-11 (×2): 10 mL via INTRAVENOUS

## 2019-06-10 MED ORDER — ORAL CARE MOUTH RINSE
15.0000 mL | Freq: Two times a day (BID) | OROMUCOSAL | Status: DC
  Administered 2019-06-10: 21:00:00 15 mL via OROMUCOSAL

## 2019-06-10 NOTE — Progress Notes (Signed)
PROGRESS NOTE    Lauren Bartlett  OIN:[id number] DOB: 05-May-1955 DOA: 06/09/2019 PCP: [name], MD   Brief Narrative:  Lauren Bartlett is a 64 y.o. female with medical history significant of COPD on 2 L at night, HFpEF, depression, GERD, history of mitral valve repair and multiple other medical problems presenting with shortness of breath.  Her symptoms started about 3 weeks ago.  She's been wheezing.  Notes it improves with steroids, but is unable to take this PO.  Steroids help for a little.  She noticed swelling in her legs yesterday, worse in left.  She notes albuterol sometimes helps with wheezing.  No fevers, chills, CP.  No N/V.  No sick or covid contacts.  No smoking or etoh.  Assessment & Plan:   Active Problems:   Wheezing  Acute Hypoxic Respiratory Failure 2/2 COPD Exacerbation  COPD with Chronic O2 Requirement, on 2 L at night:  Wheezing noted on exam, suspect 2/2 COPD exacerbation - improved today, but persistent Steroids -> transition to dexamethason IVe, duonebs, prn albuterol, doxycycline CXR with concern for vascular congestion IS, flutter Negative COVID and influenza testing Repeat CXR with improved aeration at the R base, no evidence of acute disease  Lower extremity swelling  History of HFpEF: concern for volume overload.  Normal BNP.  CXR with vascular congestion.  Follow LE US Echo with EF 70-96%, normal systolic function (28/3662) Continue IV lasix I/O, daily weights  Hypertension: continue losartan, lasix via IV  Hypothyroidism: continue synthroid  GERD: pepcid  Hx Mitral Valve Repair for MVP and Severe Regurgitation: noted, 2018  DVT prophylaxis: lovenox Code Status: full  Family Communication: none at bedside Disposition Plan:  . Patient came from: home            . Anticipated d/c place: home . Barriers to d/c OR conditions which need to be met to effect a safe d/c: persistent wheezing and SOB with intolerance to oral steroids,  continues to require inpatient treatment of COPD exacerbation as well as IV lasix  Consultants:   none  Procedures: LE US IMPRESSION: No femoropopliteal DVT nor evidence of DVT within the visualized calf veins.  Antimicrobials:  Anti-infectives (From admission, onward)   Start     Dose/Rate Route Frequency Ordered Stop   06/09/19 2200  doxycycline (VIBRA-TABS) tablet 100 mg     100 mg Oral Every 12 hours 06/09/19 1809 06/14/19 2159        Subjective: Persistent SOB and wheezing, though some improvement  Objective: Vitals:   06/10/19 0737 06/10/19 0922 06/10/19 1118 06/10/19 1209  BP:  131/76  136/73  Pulse:  95  (!) 105  Resp:  18  18  Temp:  97.9 F (36.6 C)  97.9 F (36.6 C)  TempSrc:  Oral  Oral  SpO2: 96% 93% 94% 96%  Weight:      Height:        Intake/Output Summary (Last 24 hours) at 06/10/2019 1359 Last data filed at 06/10/2019 1300 Gross per 24 hour  Intake 360 ml  Output 1400 ml  Net -1040 ml   Filed Weights   06/09/19 1124 06/09/19 1759 06/10/19 0539  Weight: 117.9 kg 115.8 kg 117.1 kg    Examination:  General exam: Appears calm and comfortable  Respiratory system: continued wheezing, though slightly improved Cardiovascular system: S1 & S2 heard, RRR.  Gastrointestinal system: Abdomen is nondistended, soft and nontender. Central nervous system: Alert and oriented. No focal neurological deficits. Extremities: 1+ LE  edema Skin: No rashes, lesions or ulcers Psychiatry: Judgement and insight appear normal. Mood & affect appropriate.     Data Reviewed: I have personally reviewed following labs and imaging studies  CBC: Recent Labs  Lab 06/09/19 1136 06/10/19 0441  WBC 7.8 6.0  NEUTROABS 5.5  --   HGB 13.9 14.1  HCT 44.0 45.0  MCV 96.5 95.5  PLT 164 696   Basic Metabolic Panel: Recent Labs  Lab 06/09/19 1136 06/10/19 0441  NA 137 140  K 4.0 4.4  CL 100 103  CO2 27 28  GLUCOSE 93 111*  BUN 12 18  CREATININE 0.75 0.82  CALCIUM  7.8* 7.5*   GFR: Estimated Creatinine Clearance: 85.2 mL/min (by C-G formula based on SCr of 0.82 mg/dL). Liver Function Tests: Recent Labs  Lab 06/09/19 1136 06/10/19 0441  AST 18 19  ALT 19 19  ALKPHOS 59 62  BILITOT 0.9 0.8  PROT 7.5 7.3  ALBUMIN 4.1 3.8   No results for input(s): LIPASE, AMYLASE in the last 168 hours. No results for input(s): AMMONIA in the last 168 hours. Coagulation Profile: No results for input(s): INR, PROTIME in the last 168 hours. Cardiac Enzymes: No results for input(s): CKTOTAL, CKMB, CKMBINDEX, TROPONINI in the last 168 hours. BNP (last 3 results) No results for input(s): PROBNP in the last 8760 hours. HbA1C: No results for input(s): HGBA1C in the last 72 hours. CBG: No results for input(s): GLUCAP in the last 168 hours. Lipid Profile: No results for input(s): CHOL, HDL, LDLCALC, TRIG, CHOLHDL, LDLDIRECT in the last 72 hours. Thyroid Function Tests: No results for input(s): TSH, T4TOTAL, FREET4, T3FREE, THYROIDAB in the last 72 hours. Anemia Panel: No results for input(s): VITAMINB12, FOLATE, FERRITIN, TIBC, IRON, RETICCTPCT in the last 72 hours. Sepsis Labs: No results for input(s): PROCALCITON, LATICACIDVEN in the last 168 hours.  Recent Results (from the past 240 hour(s))  Respiratory Panel by RT PCR (Flu Anyelina Claycomb&B, Covid) - Nasopharyngeal Swab     Status: None   Collection Time: 06/09/19  3:38 PM   Specimen: Nasopharyngeal Swab  Result Value Ref Range Status   SARS Coronavirus 2 by RT PCR NEGATIVE NEGATIVE Final    Comment: (NOTE) SARS-CoV-2 target nucleic acids are NOT DETECTED. The SARS-CoV-2 RNA is generally detectable in upper respiratoy specimens during the acute phase of infection. The lowest concentration of SARS-CoV-2 viral copies this assay can detect is 131 copies/mL. Liliah Dorian negative result does not preclude SARS-Cov-2 infection and should not be used as the sole basis for treatment or other patient management decisions. Sarita Hakanson negative  result may occur with  improper specimen collection/handling, submission of specimen other than nasopharyngeal swab, presence of viral mutation(s) within the areas targeted by this assay, and inadequate number of viral copies (<131 copies/mL). Jamarea Selner negative result must be combined with clinical observations, patient history, and epidemiological information. The expected result is Negative. Fact Sheet for Patients:  PinkCheek.be Fact Sheet for Healthcare Providers:  GravelBags.it This test is not yet ap proved or cleared by the [place] FDA and  has been authorized for detection and/or diagnosis of SARS-CoV-2 by FDA under an Emergency Use Authorization (EUA). This EUA will remain  in effect (meaning this test can be used) for the duration of the COVID-19 declaration under Section 564(b)(1) of the Act, 21 U.S.C. section 360bbb-3(b)(1), unless the authorization is terminated or revoked sooner.    Influenza Layann Bluett by PCR NEGATIVE NEGATIVE Final   Influenza B by PCR NEGATIVE NEGATIVE Final  Comment: (NOTE) The Xpert Xpress SARS-CoV-2/FLU/RSV assay is intended as an aid in  the diagnosis of influenza from Nasopharyngeal swab specimens and  should not be used as a sole basis for treatment. Nasal washings and  aspirates are unacceptable for Xpert Xpress SARS-CoV-2/FLU/RSV  testing. Fact Sheet for Patients: PinkCheek.be Fact Sheet for Healthcare Providers: GravelBags.it This test is not yet approved or cleared by the [place] FDA and  has been authorized for detection and/or diagnosis of SARS-CoV-2 by  FDA under an Emergency Use Authorization (EUA). This EUA will remain  in effect (meaning this test can be used) for the duration of the  Covid-19 declaration under Section 564(b)(1) of the Act, 21  U.S.C. section 360bbb-3(b)(1), unless the authorization is  terminated or  revoked. Performed at [facility], [address] [place], [place] [address]          Radiology Studies: X-ray chest PA and lateral  Result Date: 06/10/2019 CLINICAL DATA:  Shortness of breath EXAM: CHEST - 2 VIEW COMPARISON:  Yesterday FINDINGS: Normal heart size and mediastinal contours. Mitral valve replacement. Chronic elevation of the right diaphragm. There is no edema, consolidation, effusion, or pneumothorax. IMPRESSION: Improved aeration at the right base.  No evidence of acute disease. Electronically Signed   By: Monte Fantasia M.D.   On: 06/10/2019 07:52   US Venous Img Lower Bilateral (DVT)  Result Date: 06/10/2019 CLINICAL DATA:  shortness of breath and edema in the lower extremity EXAM: Bilateral LOWER EXTREMITY VENOUS DOPPLER ULTRASOUND TECHNIQUE: Gray-scale sonography with compression, as well as color and duplex ultrasound, were performed to evaluate the deep venous system(s) from the level of the common femoral vein through the popliteal and proximal calf veins. COMPARISON:  None. FINDINGS: VENOUS Normal compressibility of the common femoral, superficial femoral, and popliteal veins, as well as the visualized calf veins. Visualized portions of profunda femoral vein and great saphenous vein unremarkable. No filling defects to suggest DVT on grayscale or color Doppler imaging. Doppler waveforms show normal direction of venous flow, normal respiratory phasicity and response to augmentation. Limited views of the contralateral common femoral vein are unremarkable. OTHER None. Limitations: none IMPRESSION: No femoropopliteal DVT nor evidence of DVT within the visualized calf veins. Electronically Signed   By: Prudencio Pair M.D.   On: 06/10/2019 08:46   DG Chest Port 1 View  Result Date: 06/09/2019 CLINICAL DATA:  Shortness of breath. Wheezing. EXAM: PORTABLE CHEST 1 VIEW COMPARISON:  Radiograph 05/29/2019 FINDINGS: Chronic elevation of right hemidiaphragm with adjacent  atelectasis or scarring. Mild cardiomegaly, stable from prior. Prosthetic cardiac valve. Unchanged mediastinal contours. Mild vascular congestion without overt pulmonary edema. Subsegmental atelectasis at the left lung base. No acute airspace disease. No large pleural effusion. No pneumothorax. No acute osseous abnormalities are seen. IMPRESSION: 1. Vascular congestion. Mild cardiomegaly stable. 2. Chronic elevation of right hemidiaphragm with adjacent atelectasis or scarring. Electronically Signed   By: Keith Rake M.D.   On: 06/09/2019 12:35        Scheduled Meds: . aspirin  81 mg Oral Daily  . dexamethasone (DECADRON) injection  10 mg Intravenous Q24H  . doxycycline  100 mg Oral Q12H  . enoxaparin (LOVENOX) injection  40 mg Subcutaneous Q12H  . ferrous sulfate  325 mg Oral Q breakfast  . furosemide  20 mg Intravenous BID  . ipratropium-albuterol  3 mL Nebulization QID  . levothyroxine  150 mcg Oral Q0600  . losartan  50 mg Oral Daily  . mouth rinse  15 mL Mouth Rinse BID  . vitamin B-12  1,000 mcg Oral Daily   Continuous Infusions:   LOS: 0 days    Time spent: over 30 min    Fayrene Helper, MD [facility]   To contact the attending provider between 7A-7P or the covering provider during after hours 7P-7A, please log into the web site [email or web address] and access using universal [place] password for that web site. If you do not have the password, please call the hospital operator.  06/10/2019, 1:59 PM

## 2019-06-11 DIAGNOSIS — J441 Chronic obstructive pulmonary disease with (acute) exacerbation: Secondary | ICD-10-CM

## 2019-06-11 LAB — COMPREHENSIVE METABOLIC PANEL
ALT: 23 U/L (ref 0–44)
AST: 26 U/L (ref 15–41)
Albumin: 3.6 g/dL (ref 3.5–5.0)
Alkaline Phosphatase: 52 U/L (ref 38–126)
Anion gap: 10 (ref 5–15)
BUN: 30 mg/dL — ABNORMAL HIGH (ref 8–23)
CO2: 28 mmol/L (ref 22–32)
Calcium: 7.2 mg/dL — ABNORMAL LOW (ref 8.9–10.3)
Chloride: 99 mmol/L (ref 98–111)
Creatinine, Ser: 0.89 mg/dL (ref 0.44–1.00)
GFR calc Af Amer: >60 mL/min (ref >60)
GFR calc non Af Amer: >60 mL/min (ref >60)
Glucose, Bld: 105 mg/dL — ABNORMAL HIGH (ref 70–99)
Potassium: 4 mmol/L (ref 3.5–5.1)
Sodium: 137 mmol/L (ref 135–145)
Total Bilirubin: 0.9 mg/dL (ref 0.3–1.2)
Total Protein: 7 g/dL (ref 6.5–8.1)

## 2019-06-11 LAB — MAGNESIUM: Magnesium: 2.1 mg/dL (ref 1.7–2.4)

## 2019-06-11 LAB — CBC WITH DIFFERENTIAL/PLATELET
Abs Immature Granulocytes: 0.04 10*3/uL (ref 0.00–0.07)
Basophils Absolute: 0 10*3/uL (ref 0.0–0.1)
Basophils Relative: 0 %
Eosinophils Absolute: 0 10*3/uL (ref 0.0–0.5)
Eosinophils Relative: 0 %
HCT: 42.6 % (ref 36.0–46.0)
Hemoglobin: 13.6 g/dL (ref 12.0–15.0)
Immature Granulocytes: 0 %
Lymphocytes Relative: 6 %
Lymphs Abs: 0.6 10*3/uL — ABNORMAL LOW (ref 0.7–4.0)
MCH: 30.6 pg (ref 26.0–34.0)
MCHC: 31.9 g/dL (ref 30.0–36.0)
MCV: 95.7 fL (ref 80.0–100.0)
Monocytes Absolute: 0.5 10*3/uL (ref 0.1–1.0)
Monocytes Relative: 5 %
Neutro Abs: 8.3 10*3/uL — ABNORMAL HIGH (ref 1.7–7.7)
Neutrophils Relative %: 89 %
Platelets: 167 10*3/uL (ref 150–400)
RBC: 4.45 MIL/uL (ref 3.87–5.11)
RDW: 13.7 % (ref 11.5–15.5)
WBC: 9.4 10*3/uL (ref 4.0–10.5)
nRBC: 0 % (ref 0.0–0.2)

## 2019-06-11 LAB — PHOSPHORUS: Phosphorus: 4.2 mg/dL (ref 2.5–4.6)

## 2019-06-11 MED ORDER — ALBUTEROL SULFATE (2.5 MG/3ML) 0.083% IN NEBU: 2.5000 mg | INHALATION_SOLUTION | Freq: Four times a day (QID) | RESPIRATORY_TRACT | 1 refills | Status: DC | PRN

## 2019-06-11 MED ORDER — FLUTICASONE PROPIONATE HFA 110 MCG/ACT IN AERO: 2.0000 | INHALATION_SPRAY | Freq: Two times a day (BID) | RESPIRATORY_TRACT | 2 refills | Status: DC

## 2019-06-11 MED ORDER — DEXAMETHASONE SODIUM PHOSPHATE 10 MG/ML IJ SOLN
10.0000 mg | INTRAMUSCULAR | Status: DC
  Administered 2019-06-11: 11:00:00 10 mg via INTRAVENOUS
  Filled 2019-06-11: qty 1

## 2019-06-11 MED ORDER — DOXYCYCLINE HYCLATE 100 MG PO TABS: 100.0000 mg | ORAL_TABLET | Freq: Two times a day (BID) | ORAL | 0 refills | Status: AC

## 2019-06-11 MED ORDER — ALBUTEROL SULFATE HFA 108 (90 BASE) MCG/ACT IN AERS: 2.0000 | INHALATION_SPRAY | Freq: Four times a day (QID) | RESPIRATORY_TRACT | 1 refills | Status: DC | PRN

## 2019-06-11 NOTE — Plan of Care (Signed)
  Problem: Education: Goal: Knowledge of General Education information will improve Description: Including pain rating scale, medication(s)/side effects and non-pharmacologic comfort measures Outcome: Progressing   Problem: Education: Goal: Ability to demonstrate management of disease process will improve Outcome: Progressing   

## 2019-06-11 NOTE — Progress Notes (Addendum)
SATURATION QUALIFICATIONS: (This note is used to comply with regulatory documentation for home oxygen)  Patient Saturations on Room Air at Rest = 96%  Patient Saturations on Room Air while Ambulating = 86%  Patient Saturations on Room air while back at rest =97%  Patient Saturations on 2 liters while ambulating= 94%

## 2019-06-11 NOTE — TOC Transition Note (Addendum)
Transition of Care [facility]) - CM/SW Discharge Note   Patient Details  Name: [name] MRN: [id number] Date of Birth: [birth year]  [facility] CM/SW Contact:  Marshell Garfinkel, RN Phone Number: 06/11/2019, 4:01 PM   Clinical Narrative:     Orders faxed to [facility] for O2. Will fax nebulizer also when available; MD notified. Patient said her was old. New Nebulizer order faxed to [facility].        Patient Goals and CMS Choice   CMS Medicare.gov Compare Post Acute Care list provided to:: Patient Choice offered to / list presented to : Patient  Discharge Placement                       Discharge Plan and Services                DME Arranged: Nebulizer machine, Oxygen DME Agency: [facility] Date DME Agency Contacted: 06/11/19 Time DME Agency Contacted: 1600 Representative spoke with at DME Agency: [place] ([place]) Interventions     Readmission Risk Interventions No flowsheet data found.

## 2019-06-11 NOTE — Plan of Care (Signed)
  Problem: Education: Goal: Knowledge of General Education information will improve Description: Including pain rating scale, medication(s)/side effects and non-pharmacologic comfort measures Outcome: Progressing   Problem: Pain Managment: Goal: General experience of comfort will improve Outcome: Completed/Met   Problem: Safety: Goal: Ability to remain free from injury will improve Outcome: Completed/Met

## 2019-06-11 NOTE — Progress Notes (Signed)
Pt's home O2 delivered and pt transported out to private vehicle via wheelchair. All personal belongings and discharge paperwork sent home with patient.   Earleen Reaper, RN

## 2019-06-11 NOTE — Discharge Summary (Signed)
Physician Discharge Summary  Lauren Bartlett [id number] DOB: 1956/02/21 DOA: 06/09/2019  PCP: [name], MD  Admit date: 06/09/2019 Discharge date: 06/11/2019  Time spent: 40 minutes  Recommendations for Outpatient Follow-up:  1. Follow outpatient CBC/CMP 2. Continue triple therapy outpatient with stiolto plus flovent (she's not comfortable with stiolto use yet, planning to use anoro ellipta until she's able to follow with outpatient provider) 3. Discharged with supplemental oxygen with activity and at night, follow for continued need 4. Continue lasix at current dose 5. Follow with pulmonology outpatient 6. Follow CXR outpatient   Discharge Diagnoses:  Active Problems:   Wheezing   COPD exacerbation [facility])   Discharge Condition: stable  Diet recommendation: heart healthy  Filed Weights   06/10/19 0539 06/11/19 0022 06/11/19 0500  Weight: 117.1 kg 115.7 kg 115.7 kg   History of present illness:  DAMITRA Bartlett Lauren Bartlett 64 y.o.femalewith medical history significant ofCOPD on 2 L at night, HFpEF, depression, GERD, history of mitral valve repairand multiple other medical problems presenting with shortness of breath.  Her symptoms started about 3 weeks ago. She's been wheezing. Notes it improves with steroids, but is unable to take this PO. Steroids help for Roniesha Hollingshead little. She noticed swelling in her legs yesterday, worse in left. She notes albuterol sometimes helps with wheezing. No fevers, chills, CP. No N/V. No sick or covid contacts. No smoking or etoh.  She was seen for Lauren Bartlett COPD exacerbation with possible contribution from HF.  She's improved with steroids, abx, and nebs as well as IV lasix.  Discharged on 3/13 with supplemental O2 as well as flovent.  Hospital Course:  Acute Hypoxic Respiratory Failure 2/2 COPD Exacerbation  COPD with Chronic O2 Requirement, on 2 L at night:  Wheezing noted on exam, suspect 2/2 COPD exacerbation - improved on day of discharge, requiring  2 L with activity - discharged with O2 She's intolerant to oral steroids Will start flovent to add to stiolto (she's not comfortable with this, will use anoro ellipta until she gets teaching from pulm) CXR with concern for vascular congestion IS, flutter Negative COVID and influenza testing Repeat CXR with improved aeration at the R base, no evidence of acute disease  Lower extremity swelling  History of HFpEF:concern for volume overload. Normal BNP. CXR with vascular congestion.  Follow LE [place] - negative Echo with EF 0000000, normal systolic function ([id number]) Resume home lasix I/O, daily weights  Hypertension: continue losartan, PO lasix  Hypothyroidism: continue synthroid  GERD: pepcid  Hx Mitral Valve Repair for MVP and Severe Regurgitation: noted, 2018  Procedures: LE US IMPRESSION: No femoropopliteal DVT nor evidence of DVT within the visualized calf veins.  Consultations:  none  Discharge Exam: Vitals:   06/11/19 0806 06/11/19 1310  BP: 112/62 130/77  Pulse: 78 94  Resp: 16 14  Temp: 97.7 F (36.5 C) 97.8 F (36.6 C)  SpO2: 95% 96%   Feeling better - comfortable with d/c Brother at bedside  General: No acute distress. Cardiovascular: Heart sounds show Jackson Coffield regular rate, and rhythm.No gallops or rubs. No murmurs. No JVD. Lungs: Clear to auscultation bilaterally  Abdomen: Soft, nontender, nondistended Neurological: Alert and oriented 3. Moves all extremities 4 . Cranial nerves II through XII grossly intact. Skin: Warm and dry. No rashes or lesions. Extremities: No clubbing or cyanosis. No edema.  Discharge Instructions   Discharge Instructions    Call MD for:  difficulty breathing, headache or visual disturbances   Complete by: As directed  Call MD for:  extreme fatigue   Complete by: As directed    Call MD for:  hives   Complete by: As directed    Call MD for:  persistant dizziness or light-headedness   Complete by: As directed    Call  MD for:  persistant nausea and vomiting   Complete by: As directed    Call MD for:  redness, tenderness, or signs of infection (pain, swelling, redness, odor or green/yellow discharge around incision site)   Complete by: As directed    Call MD for:  severe uncontrolled pain   Complete by: As directed    Call MD for:  temperature >100.4   Complete by: As directed    Diet - low sodium heart healthy   Complete by: As directed    Diet - low sodium heart healthy   Complete by: As directed    Discharge instructions   Complete by: As directed    You were seen for Lauren Bartlett COPD exacerbation.  You've improved with IV steroids and antibiotics.  We'll prescribe flovent for you to use with your stiolto (bring this to your next appointment so they can teach you how to use it - you can use the anoro until then).   Continue your lasix.   We're changing your oxygen.  Use 2 Liters with activity and at night.  Please follow with pulmonology for additional changes.  Return for new, recurrent, or worsening symptoms.  Please ask your PCP to request records from this hospitalization so they know what was done and what the next steps will be.   For home use only DME Nebulizer machine   Complete by: As directed    Patient needs Onnika Siebel nebulizer to treat with the following condition: COPD (chronic obstructive pulmonary disease) ([place])   Length of Need: Lifetime   Increase activity slowly   Complete by: As directed    Increase activity slowly   Complete by: As directed      Allergies as of 06/11/2019      Reactions   Codeine Hives, Nausea And Vomiting   Levaquin [levofloxacin] Nausea And Vomiting   Penicillins Hives, Itching, Other (See Comments)   Has patient had Kam Rahimi PCN reaction causing immediate rash, facial/tongue/throat swelling, SOB or lightheadedness with hypotension: No Has patient had Merleen Picazo PCN reaction causing severe rash involving mucus membranes or skin necrosis: No Has patient had Mahkai Fangman PCN reaction that  required hospitalization No Has patient had Jakia Kennebrew PCN reaction occurring within the last 10 years: No If all of the above answers are "NO", then may proceed with Cephalosporin use.   Zithromax [azithromycin] Other (See Comments)   Reaction:  Hallucinations    Prednisone Palpitations, Other (See Comments)   Reaction:  Hallucinations  Can take injections but not oral meds      Medication List    TAKE these medications   acetaminophen 500 MG tablet Commonly known as: TYLENOL Take 500 mg by mouth 2 (two) times daily as needed for moderate pain or headache.   albuterol (2.5 MG/3ML) 0.083% nebulizer solution Commonly known as: PROVENTIL Take 3 mLs (2.5 mg total) by nebulization every 6 (six) hours as needed for wheezing or shortness of breath. What changed: Another medication with the same name was added. Make sure you understand how and when to take each.   albuterol 108 (90 Base) MCG/ACT inhaler Commonly known as: VENTOLIN HFA Inhale 2 puffs into the lungs every 6 (six) hours as needed for wheezing or shortness  of breath. What changed: You were already taking Sharif Rendell medication with the same name, and this prescription was added. Make sure you understand how and when to take each.   aspirin 81 MG EC tablet Take 1 tablet (81 mg total) by mouth daily.   doxycycline 100 MG tablet Commonly known as: VIBRA-TABS Take 1 tablet (100 mg total) by mouth every 12 (twelve) hours for 3 days. What changed: when to take this   ergocalciferol 1.25 MG (50000 UT) capsule Commonly known as: Drisdol Take 1 capsule (50,000 Units total) by mouth once [place] Vi week.   famotidine 10 MG chewable tablet Commonly known as: PEPCID AC Chew 10 mg by mouth daily as needed for heartburn.   ferrous sulfate 325 (65 FE) MG tablet Take 325 mg by mouth daily with breakfast.   fluticasone 110 MCG/ACT inhaler Commonly known as: FLOVENT HFA Inhale 2 puffs into the lungs 2 (two) times daily.   fluticasone 50 MCG/ACT nasal  spray Commonly known as: FLONASE USE 2 SPRAYS IN EACH NOSTRIL EVERY DAY   furosemide 20 MG tablet Commonly known as: LASIX Take 1 tablet (20 mg total) by mouth 2 (two) times daily.   levothyroxine 150 MCG tablet Commonly known as: SYNTHROID Take 1 tablet (150 mcg total) by mouth daily before breakfast.   loratadine-pseudoephedrine 10-240 MG 24 hr tablet Commonly known as: CLARITIN-D 24-hour Take 1 tablet by mouth daily.   losartan 50 MG tablet Commonly known as: COZAAR TAKE 1 TABLET EVERY DAY   OXYGEN Inhale 2 L into the lungs at bedtime as needed (at bedtime and as needed).   oxymetazoline 0.05 % nasal spray Commonly known as: AFRIN Place 1 spray into both nostrils 2 (two) times daily. As needed for nose bleed   polyethylene glycol powder 17 GM/SCOOP powder Commonly known as: GLYCOLAX/MIRALAX Take 17 g by mouth 2 (two) times daily as needed. What changed:   when to take this  reasons to take this   Tiotropium Bromide-Olodaterol 2.5-2.5 MCG/ACT Aers Inhale 2 puffs into the lungs daily.   traMADol 50 MG tablet Commonly known as: ULTRAM Take 1-2 tablets (50-100 mg total) by mouth every 4 (four) hours as needed for moderate pain.   vitamin B-12 1000 MCG tablet Commonly known as: CYANOCOBALAMIN Take 1 tablet (1,000 mcg total) by mouth daily.            Durable Medical Equipment  (From admission, onward)         Start     Ordered   06/11/19 1539  DME Oxygen  Once    Comments: SATURATION QUALIFICATIONS: (This note is used to comply with regulatory documentation for home oxygen)  Patient Saturations on Room Air at Rest = 96%  Patient Saturations on Room Air while Ambulating = 86%  Patient Saturations on Room air while back at rest =97%  Patient Saturations on 2 liters while ambulating= 94%  Question Answer Comment  Length of Need 12 Months   Mode or (Route) Nasal cannula   Liters per Minute 2   Frequency Continuous (stationary and portable oxygen  unit needed)   Oxygen conserving device Yes   Oxygen delivery system Gas      06/11/19 1542   06/11/19 0000  For home use only DME Nebulizer machine    Question Answer Comment  Patient needs Alizeh Madril nebulizer to treat with the following condition COPD (chronic obstructive pulmonary disease) ([place])   Length of Need Lifetime      06/11/19 1615  Allergies  Allergen Reactions  . Codeine Hives and Nausea And Vomiting  . Levaquin [Levofloxacin] Nausea And Vomiting  . Penicillins Hives, Itching and Other (See Comments)    Has patient had Monae Topping PCN reaction causing immediate rash, facial/tongue/throat swelling, SOB or lightheadedness with hypotension: No Has patient had Connery Shiffler PCN reaction causing severe rash involving mucus membranes or skin necrosis: No Has patient had Shamiya Demeritt PCN reaction that required hospitalization No Has patient had Guage Efferson PCN reaction occurring within the last 10 years: No If all of the above answers are "NO", then may proceed with Cephalosporin use.  [name] Zithromax [Azithromycin] Other (See Comments)    Reaction:  Hallucinations   . Prednisone Palpitations and Other (See Comments)    Reaction:  Hallucinations  Can take injections but not oral meds   Follow-up Information    [facility] Follow up on 06/21/2019.   Specialty: Cardiology Why: at 12:30pm. Enter through the [facility] entrance Contact information: [address] [place] [phone]           The results of significant diagnostics from this hospitalization (including imaging, microbiology, ancillary and laboratory) are listed below for reference.    Significant Diagnostic Studies: X-ray chest PA and lateral  Result Date: 06/10/2019 CLINICAL DATA:  Shortness of breath EXAM: CHEST - 2 VIEW COMPARISON:  Yesterday FINDINGS: Normal heart size and mediastinal contours. Mitral valve replacement. Chronic elevation of the right  diaphragm. There is no edema, consolidation, effusion, or pneumothorax. IMPRESSION: Improved aeration at the right base.  No evidence of acute disease. Electronically Signed   By: Monte Fantasia M.D.   On: 06/10/2019 07:52   DG Chest 2 View  Result Date: 05/29/2019 CLINICAL DATA:  Acute shortness of breath for 1 week. EXAM: CHEST - 2 VIEW COMPARISON:  08/04/2016 and prior radiographs FINDINGS: The cardiomediastinal silhouette is unremarkable. Elevated RIGHT hemidiaphragm and RIGHT basilar scarring again noted. There is no evidence of focal airspace disease, pulmonary edema, suspicious pulmonary nodule/mass, pleural effusion, or pneumothorax. No acute bony abnormalities are identified. An apex RIGHT thoracic scoliosis is again noted. IMPRESSION: No evidence of acute cardiopulmonary disease. Electronically Signed   By: Margarette Canada M.D.   On: 05/29/2019 11:17   US Venous Img Lower Bilateral (DVT)  Result Date: 06/10/2019 CLINICAL DATA:  shortness of breath and edema in the lower extremity EXAM: Bilateral LOWER EXTREMITY VENOUS DOPPLER ULTRASOUND TECHNIQUE: Gray-scale sonography with compression, as well as color and duplex ultrasound, were performed to evaluate the deep venous system(s) from the level of the common femoral vein through the popliteal and proximal calf veins. COMPARISON:  None. FINDINGS: VENOUS Normal compressibility of the common femoral, superficial femoral, and popliteal veins, as well as the visualized calf veins. Visualized portions of profunda femoral vein and great saphenous vein unremarkable. No filling defects to suggest DVT on grayscale or color Doppler imaging. Doppler waveforms show normal direction of venous flow, normal respiratory phasicity and response to augmentation. Limited views of the contralateral common femoral vein are unremarkable. OTHER None. Limitations: none IMPRESSION: No femoropopliteal DVT nor evidence of DVT within the visualized calf veins. Electronically Signed    By: Prudencio Pair M.D.   On: 06/10/2019 08:46   DG Chest Port 1 View  Result Date: 06/09/2019 CLINICAL DATA:  Shortness of breath. Wheezing. EXAM: PORTABLE CHEST 1 VIEW COMPARISON:  Radiograph 05/29/2019 FINDINGS: Chronic elevation of right hemidiaphragm with adjacent atelectasis or scarring. Mild cardiomegaly, stable from  prior. Prosthetic cardiac valve. Unchanged mediastinal contours. Mild vascular congestion without overt pulmonary edema. Subsegmental atelectasis at the left lung base. No acute airspace disease. No large pleural effusion. No pneumothorax. No acute osseous abnormalities are seen. IMPRESSION: 1. Vascular congestion. Mild cardiomegaly stable. 2. Chronic elevation of right hemidiaphragm with adjacent atelectasis or scarring. Electronically Signed   By: Keith Rake M.D.   On: 06/09/2019 12:35    Microbiology: Recent Results (from the past 240 hour(s))  Respiratory Panel by RT PCR (Flu Ananiah Maciolek&B, Covid) - Nasopharyngeal Swab     Status: None   Collection Time: 06/09/19  3:38 PM   Specimen: Nasopharyngeal Swab  Result Value Ref Range Status   SARS Coronavirus 2 by RT PCR NEGATIVE NEGATIVE Final    Comment: (NOTE) SARS-CoV-2 target nucleic acids are NOT DETECTED. The SARS-CoV-2 RNA is generally detectable in upper respiratoy specimens during the acute phase of infection. The lowest concentration of SARS-CoV-2 viral copies this assay can detect is 131 copies/mL. Marquitta Persichetti negative result does not preclude SARS-Cov-2 infection and should not be used as the sole basis for treatment or other patient management decisions. Lakira Ogando negative result may occur with  improper specimen collection/handling, submission of specimen other than nasopharyngeal swab, presence of viral mutation(s) within the areas targeted by this assay, and inadequate number of viral copies (<131 copies/mL). Alaia Lordi negative result must be combined with clinical observations, patient history, and epidemiological information.  The expected result is Negative. Fact Sheet for Patients:  PinkCheek.be Fact Sheet for Healthcare Providers:  GravelBags.it This test is not yet ap proved or cleared by the [place] FDA and  has been authorized for detection and/or diagnosis of SARS-CoV-2 by FDA under an Emergency Use Authorization (EUA). This EUA will remain  in effect (meaning this test can be used) for the duration of the COVID-19 declaration under Section 564(b)(1) of the Act, 21 U.S.C. section 360bbb-3(b)(1), unless the authorization is terminated or revoked sooner.    Influenza Maryrose Colvin by PCR NEGATIVE NEGATIVE Final   Influenza B by PCR NEGATIVE NEGATIVE Final    Comment: (NOTE) The Xpert Xpress SARS-CoV-2/FLU/RSV assay is intended as an aid in  the diagnosis of influenza from Nasopharyngeal swab specimens and  should not be used as Shamiah Kahler sole basis for treatment. Nasal washings and  aspirates are unacceptable for Xpert Xpress SARS-CoV-2/FLU/RSV  testing. Fact Sheet for Patients: PinkCheek.be Fact Sheet for Healthcare Providers: GravelBags.it This test is not yet approved or cleared by the [place] FDA and  has been authorized for detection and/or diagnosis of SARS-CoV-2 by  FDA under an Emergency Use Authorization (EUA). This EUA will remain  in effect (meaning this test can be used) for the duration of the  Covid-19 declaration under Section 564(b)(1) of the Act, 21  U.S.C. section 360bbb-3(b)(1), unless the authorization is  terminated or revoked. Performed at [facility], [address] [place], [place] [address]      Labs: Basic Metabolic Panel: Recent Labs  Lab 06/09/19 1136 06/10/19 0441 06/11/19 0648  NA 137 140 137  K 4.0 4.4 4.0  CL 100 103 99  CO2 27 28 28   GLUCOSE 93 111* 105*  BUN 12 18 30*  CREATININE 0.75 0.82 0.89  CALCIUM 7.8* 7.5* 7.2*  MG  --   --   2.1  PHOS  --   --  4.2   Liver Function Tests: Recent Labs  Lab 06/09/19 1136 06/10/19 0441 06/11/19 0648  AST 18 19 26   ALT 19  19 23  ALKPHOS 59 62 52  BILITOT 0.9 0.8 0.9  PROT 7.5 7.3 7.0  ALBUMIN 4.1 3.8 3.6   No results for input(s): LIPASE, AMYLASE in the last 168 hours. No results for input(s): AMMONIA in the last 168 hours. CBC: Recent Labs  Lab 06/09/19 1136 06/10/19 0441 06/11/19 0648  WBC 7.8 6.0 9.4  NEUTROABS 5.5  --  8.3*  HGB 13.9 14.1 13.6  HCT 44.0 45.0 42.6  MCV 96.5 95.5 95.7  PLT 164 163 167   Cardiac Enzymes: No results for input(s): CKTOTAL, CKMB, CKMBINDEX, TROPONINI in the last 168 hours. BNP: BNP (last 3 results) Recent Labs    01/26/19 1458 06/09/19 1136  BNP 61 65.0    ProBNP (last 3 results) No results for input(s): PROBNP in the last 8760 hours.  CBG: No results for input(s): GLUCAP in the last 168 hours.     Signed:  Fayrene Helper MD.  [facility] 06/11/2019, 8:33 PM

## 2019-06-13 ENCOUNTER — Telehealth: Payer: Self-pay | Admitting: Family

## 2019-06-13 NOTE — Telephone Encounter (Signed)
Unable to reach patient regarding her new patient CHF Clinic appt we have scheduled after her recent hospital d/c for 3/23 at 1230pm.     Alyse Low, NT

## 2019-06-14 ENCOUNTER — Telehealth: Payer: Self-pay

## 2019-06-14 NOTE — Telephone Encounter (Signed)
Failed attempt to reach patient regarding transition of care. No answer.

## 2019-06-15 ENCOUNTER — Ambulatory Visit (INDEPENDENT_AMBULATORY_CARE_PROVIDER_SITE_OTHER): Payer: Medicare HMO | Admitting: Acute Care

## 2019-06-15 ENCOUNTER — Other Ambulatory Visit: Payer: Self-pay

## 2019-06-15 ENCOUNTER — Encounter: Payer: Self-pay | Admitting: Acute Care

## 2019-06-15 VITALS — BP 124/74 | HR 90 | Temp 97.3°F | Ht 62.0 in | Wt 261.0 lb

## 2019-06-15 DIAGNOSIS — K766 Portal hypertension: Secondary | ICD-10-CM

## 2019-06-15 DIAGNOSIS — J449 Chronic obstructive pulmonary disease, unspecified: Secondary | ICD-10-CM | POA: Diagnosis not present

## 2019-06-15 DIAGNOSIS — I509 Heart failure, unspecified: Secondary | ICD-10-CM | POA: Diagnosis not present

## 2019-06-15 DIAGNOSIS — G4736 Sleep related hypoventilation in conditions classified elsewhere: Secondary | ICD-10-CM

## 2019-06-15 DIAGNOSIS — J441 Chronic obstructive pulmonary disease with (acute) exacerbation: Secondary | ICD-10-CM

## 2019-06-15 DIAGNOSIS — I2721 Secondary pulmonary arterial hypertension: Secondary | ICD-10-CM | POA: Diagnosis not present

## 2019-06-15 NOTE — Telephone Encounter (Signed)
Second failed attempt to reach patient for transition of care. No answer. Nurse left message for patient to call back and schedule with her PMD as needed.

## 2019-06-15 NOTE — Patient Instructions (Addendum)
It is good to meet you today. We will give you some instruction on using the Stialto inhaler Use Anoro until it is gone, then switch to [employer] twice daily. Rinse mouth after use. Use your nebulizer treatments to help with secretion clearance. Use your Flutter valve 3-4 blows twice daily.Lauren Bartlett  Machine) This should increase your ability to clear secretions. Continue working on increasing activity.  Continue Lasix as you have been doing Follow up with PCP for labs and dosing adjustments. Watch salt intake Meet with nutritionist next week as scheduled.  Continue wearing your oxygen at bedtime. We will call [employer] to come and replace your oxygen.  Follow up in 3 months with Dr. Duwayne Heck Note your daily symptoms > remember "red flags" for COPD:  Increase in cough, increase in sputum production, increase in shortness of breath or activity intolerance. If you notice these symptoms, please call to be seen.   Please contact office for sooner follow up if symptoms do not improve or worsen or seek emergency care

## 2019-06-15 NOTE — Telephone Encounter (Signed)
Transition Care Management Follow-up Telephone Call  Date of discharge and from where: 06/11/19 from [facility]  How have you been since you were released from the hospital? "I am doing better now." My breathing is better and I saw pulmonary today, said I can put oxygen on during the day if I need to, but right now I only need it at bedtime."  Denies shortness of breath, wheezing, nausea, emesis, headache, dizziness, chest pain, abd pain, diarrhea and all other symptoms. BM/Urine no issue. Intake/output appropriate. 32 ounce fluid intake daily. Using nebulizer as directed.   Any questions or concerns? When do I stop taking lasix twice daily and return to once daily?  Items Reviewed:  Did the pt receive and understand the discharge instructions provided? Yes  Medications obtained and verified? Yes  Any new allergies since your discharge? No  Dietary orders reviewed? Low sodium, heart healthy  Do you have support at home? Yes, my brothers check on me daily and I have aide assist every day for 2 hours.   Functional Questionnaire: (I = Independent and D = Dependent) ADLs: Aide assist  Meal Prep- meals on wheels in use  Eating- I  Maintaining continence- I  Transferring/Ambulation- Walker/cane in use.  Managing Meds- I  Follow up appointments reviewed:   PCP Hospital f/u appt confirmed? Scheduled to see Dr.Tullo on 06/20/19 @ 11:00. Labs due.   Cardiology f/u appt confirmed? Scheduled 06/21/19 @ 12:30.  Are transportation arrangements needed? Yes, patient states scheduled.   If their condition worsens, is the pt aware to call PCP or go to the Emergency Dept.? Yes  Was the patient provided with contact information for the PCP's office or ED? Yes  Was to pt encouraged to call back with questions or concerns? Yes

## 2019-06-15 NOTE — Progress Notes (Signed)
History of Present Illness Lauren Bartlett is a 64 y.o. female never smoker  with medical history significant ofCOPD on 2 L at night, HFpEF, depression, GERD, history of mitral valve repair( 06/2016), Pulmonary Hypertension , solitary kidney, and hypothyroidism .She was previously followed by Dr. Alva Garnet.   PT PROFILE: 64 y.o. F never smoker with significant lifetime exposure to second hand smoke with multiple medical problems including chronic hypoxic respiratory failure, moderate COPD, mitral valve prolapse and regurgitation, pulm htn by echocardiogram, appearance of tracheomalacia on CT chest previously followed by Dr Raul Del and referred for further evaluation of severe exertional limitation. Initial eval suggested severe mitral regurgitation and impression was that this was a major contributor to her severe DOE   06/15/2019  Pt. Presents for follow up. She was recently in the hospital 06/09/2019-06/11/2019 for a COPD exacerbation. She was treated with antibiotics, prednisone  And BD. She presents today stating she has had increased congestion in her chest . She does note that her secretions are clear.She just took her last dose of Doxycycline today. She denies any fever. She is concerned that she is getting sick again. She states she has an allergy to mucinex, so she cannot use his for chest congestion.. She does have a flutter valve and an IS, which she has not been using, but will use for mucociliary clearance. She has received IV/IM steroid for her recent COPD flres, but I an concerned the steroids may be contributing to her weight gain, fluid retention. On exam she is in no distress. She has no fever, and her says are 98% today on RA.She has no wheezing on exam. She does not have crackles per bases,. Her oxygen tank does not work today. It is unlocked with the key, and there is no flow from the tank.. We will have [facility] replace it. She states she is compliant with her Lasix daily. She was seen  by cardiology 3/4/021. They did increase her Lasix to 40 mg x 1 week . She has a history of increase in weight over the last several months. Her ankles are not edematous today. She states she has had a significant improvement in the swelling.She has scheduled lab work this week with cardiology.   Test Results: CXR 3/12 2021 Normal heart size and mediastinal contours. Mitral valve replacement. Chronic elevation of the right diaphragm. There is no edema, consolidation, effusion, or pneumothorax. Improved aeration at the right base.  No evidence of acute disease.  06/10/2019 Bilateral Venous US( DVT) No femoropopliteal DVT nor evidence of DVT within the visualized calf veins  Echo 01/2019 Left ventricular ejection fraction, by visual estimation, is 55 to  60%. The left ventricle has normal function. There is mildly increased  left ventricular hypertrophy.  2. Global right ventricle has normal systolic function.The right  ventricular size is normal. No increase in right ventricular wall  thickness.  3. The mitral valve has been repaired/replaced. No evidence of mitral  valve regurgitation. Mild mitral stenosis with mean gradient of 8 mm Hg.  4. The tricuspid valve is normal in structure. Tricuspid valve  regurgitation is trivial.  5. The aortic valve is normal in structure. Aortic valve regurgitation is  not visualized. No evidence of aortic valve sclerosis or stenosis.  6. Moderately elevated pulmonary artery systolic pressure.  7. The inferior vena cava is dilated in size with >50% respiratory  variability, suggesting right atrial pressure of 8 mmHg.  8. The tricuspid regurgitant velocity is 3.05 m/s, and with an  assumed  right atrial pressure of 8 mmHg, the estimated right ventricular systolic  pressure is moderately elevated at 45.3 mmHg.   Echocardiogram 02/01/13: Normal LVEF. MVP with moderate MR, Moderate AI, severe TI. RVSP est 91 mmHg Chronic diastolic congestive heart  failure ([place])     a. 03/2016 Echo: EF 55-60%, Gr2 DD; b. 08/2016 Echo: EF 60-65%, nl diast fxn; c. 01/2019 Echo: EF 55-60%. Nl RV fxn.    CT chest 07/29/15: Cardiomegaly with left atrial dilatation. Tracheobronchomalacia. Air trapping in the lungs bilaterally, indicative of small airways disease Echocardiogram 06/13/14: Normal LVEF, Severe MR, RVSP est 62 mmHg Spirometry 03/12/15: FVC 1.94 L (73% pred). FEV1 1.19 L (55% pred), FEV1/FVC 61% 6MWT 03/12/15: 225 meters, no desaturation LE venous US 07/29/15: No DVT Echocardiogram 07/31/15: LVEF 60-65%. MV poorly visualized, trivial MR, LA size normal, TV poorly visualized, RVSP not estimated CT chest 03/11/16: Stable changes of tracheobronchomalacia Echocardiogram 04/15/16: Normal LVEF, Severe MR, moderate PAH Mitral valvuloplasty 07/17/16: @ [facility] by Dr Roxy Manns Echocardiogram 09/15/16: LVEF 60-65%. RVSP estimate 54 mmHg ONO 10/21/16: on RA. 45 mins < 89%, lowest SpO2 84% PFTs 03/26/17: FVC: 1.73 > 1.74 L (63 %pred), FEV1: 1.01 > 1.16 L (45> 52%pred, 15% improvement after bronchodilator), FEV1/FVC: 59 >67%, TLC: 3.27 L (73 %pred), DLCO 46 %pred, DLCO/VA 103% PFT: mod/sev obstructive airway dzs w/ significant bronchodilator response   CBC Latest Ref Rng & Units 06/11/2019 06/10/2019 06/09/2019  WBC 4.0 - 10.5 K/uL 9.4 6.0 7.8  Hemoglobin 12.0 - 15.0 g/dL 13.6 14.1 13.9  Hematocrit 36.0 - 46.0 % 42.6 45.0 44.0  Platelets 150 - 400 K/uL 167 163 164    BMP Latest Ref Rng & Units 06/11/2019 06/10/2019 06/09/2019  Glucose 70 - 99 mg/dL 105(H) 111(H) 93  BUN 8 - 23 mg/dL 30(H) 18 12  Creatinine 0.44 - 1.00 mg/dL 0.89 0.82 0.75  BUN/Creat Ratio 12 - 28 - - -  Sodium 135 - 145 mmol/L 137 140 137  Potassium 3.5 - 5.1 mmol/L 4.0 4.4 4.0  Chloride 98 - 111 mmol/L 99 103 100  CO2 22 - 32 mmol/L 28 28 27   Calcium 8.9 - 10.3 mg/dL 7.2(L) 7.5(L) 7.8(L)    BNP    Component Value Date/Time   BNP 65.0 06/09/2019 1136   BNP 61 01/26/2019 1458     ProBNP    Component Value Date/Time   PROBNP 67.0 09/22/2013 1141    PFT    Component Value Date/Time   FEV1PRE 1.12 07/14/2016 1302   FEV1POST 1.30 07/14/2016 1302   FVCPRE 1.82 07/14/2016 1302   FVCPOST 1.94 07/14/2016 1302   DLCOUNC 15.72 07/14/2016 1302   PREFEV1FVCRT 62 07/14/2016 1302   PSTFEV1FVCRT 67 07/14/2016 1302    X-ray chest PA and lateral  Result Date: 06/10/2019 CLINICAL DATA:  Shortness of breath EXAM: CHEST - 2 VIEW COMPARISON:  Yesterday FINDINGS: Normal heart size and mediastinal contours. Mitral valve replacement. Chronic elevation of the right diaphragm. There is no edema, consolidation, effusion, or pneumothorax. IMPRESSION: Improved aeration at the right base.  No evidence of acute disease. Electronically Signed   By: Monte Fantasia M.D.   On: 06/10/2019 07:52   DG Chest 2 View  Result Date: 05/29/2019 CLINICAL DATA:  Acute shortness of breath for 1 week. EXAM: CHEST - 2 VIEW COMPARISON:  08/04/2016 and prior radiographs FINDINGS: The cardiomediastinal silhouette is unremarkable. Elevated RIGHT hemidiaphragm and RIGHT basilar scarring again noted. There is no evidence of focal airspace disease, pulmonary edema, suspicious pulmonary  nodule/mass, pleural effusion, or pneumothorax. No acute bony abnormalities are identified. An apex RIGHT thoracic scoliosis is again noted. IMPRESSION: No evidence of acute cardiopulmonary disease. Electronically Signed   By: Margarette Canada M.D.   On: 05/29/2019 11:17   US Venous Img Lower Bilateral (DVT)  Result Date: 06/10/2019 CLINICAL DATA:  shortness of breath and edema in the lower extremity EXAM: Bilateral LOWER EXTREMITY VENOUS DOPPLER ULTRASOUND TECHNIQUE: Gray-scale sonography with compression, as well as color and duplex ultrasound, were performed to evaluate the deep venous system(s) from the level of the common femoral vein through the popliteal and proximal calf veins. COMPARISON:  None. FINDINGS: VENOUS Normal  compressibility of the common femoral, superficial femoral, and popliteal veins, as well as the visualized calf veins. Visualized portions of profunda femoral vein and great saphenous vein unremarkable. No filling defects to suggest DVT on grayscale or color Doppler imaging. Doppler waveforms show normal direction of venous flow, normal respiratory phasicity and response to augmentation. Limited views of the contralateral common femoral vein are unremarkable. OTHER None. Limitations: none IMPRESSION: No femoropopliteal DVT nor evidence of DVT within the visualized calf veins. Electronically Signed   By: Prudencio Pair M.D.   On: 06/10/2019 08:46   DG Chest Port 1 View  Result Date: 06/09/2019 CLINICAL DATA:  Shortness of breath. Wheezing. EXAM: PORTABLE CHEST 1 VIEW COMPARISON:  Radiograph 05/29/2019 FINDINGS: Chronic elevation of right hemidiaphragm with adjacent atelectasis or scarring. Mild cardiomegaly, stable from prior. Prosthetic cardiac valve. Unchanged mediastinal contours. Mild vascular congestion without overt pulmonary edema. Subsegmental atelectasis at the left lung base. No acute airspace disease. No large pleural effusion. No pneumothorax. No acute osseous abnormalities are seen. IMPRESSION: 1. Vascular congestion. Mild cardiomegaly stable. 2. Chronic elevation of right hemidiaphragm with adjacent atelectasis or scarring. Electronically Signed   By: Keith Rake M.D.   On: 06/09/2019 12:35     Past medical hx Past Medical History:  Diagnosis Date  . Anxiety   . Bleeding from the nose   . Chronic diastolic congestive heart failure ([place])    a. 03/2016 Echo: EF 55-60%, Gr2 DD; b. 08/2016 Echo: EF 60-65%, nl diast fxn; c. 01/2019 Echo: EF 55-60%. Nl RV fxn.  . Congenital absence of one kidney   . COPD (chronic obstructive pulmonary disease) ([place])    a. 02/2017 PFT: mod/sev obstructive airway dzs w/ significant bronchodilator response.  . Depression    treated at [facility]  .  Dyspnea   . Emphysema/COPD ([place])   . GERD (gastroesophageal reflux disease)   . History of cardiac cath    a. 05/2016 Cath: nl cors.  . History of sciatica   . Hypertension   . Hypothyroidism   . Mitral Valve Prolapse & Severe mitral regurgitation s/p MVR    a. 03/2016 Echo: severe MVP involving the posterior leaflet, sev MR;  b. 06/2016 min invasive MV repair w/ triangular resection of flail segment (P2) of posterior leaflet, gore-tex neochord placement x 4, Sorin Memo 3D rin Annulosplasty (108mm, catalog # [id number], ser # [id number]); c. 08/2016 Echo: MV area 2.06 cm^2 (pressure 1/2 time); d. 01/2019 Echo: Mild MS w/ mean grad 30mmHg.  . Motion sickness    cars  . Pneumonia   . PONV (postoperative nausea and vomiting)   . Post-op Afib    a. 06/2016 following MV repair-->short-course amio. Coumadin d/c'd 2/2 epistaxis.  . Pulmonary hypertension ([place])    a. 08/2016 Echo: PASP 15mmHg; b. 01/2019 Echo: RVSP 45.74mmHg.  . S/P patent  foramen ovale closure    a. 06/2016 @ time of MV Repair.  . Sleep apnea    O2 at night and PRN     Social History   Tobacco Use  . Smoking status: Never Smoker  . Smokeless tobacco: Never Used  Substance Use Topics  . Alcohol use: No  . Drug use: No    Ms.Curd reports that she has never smoked. She has never used smokeless tobacco. She reports that she does not drink alcohol or use drugs.  Tobacco Cessation: Never smoker  Past surgical hx, Family hx, Social hx all reviewed.  Current Outpatient Medications on File Prior to Visit  Medication Sig  . acetaminophen (TYLENOL) 500 MG tablet Take 500 mg by mouth 2 (two) times daily as needed for moderate pain or headache.  . albuterol (PROVENTIL) (2.5 MG/3ML) 0.083% nebulizer solution Take 3 mLs (2.5 mg total) by nebulization every 6 (six) hours as needed for wheezing or shortness of breath.  [name] albuterol (VENTOLIN HFA) 108 (90 Base) MCG/ACT inhaler Inhale 2 puffs into the lungs every 6 (six) hours as needed for  wheezing or shortness of breath.  [name] aspirin EC 81 MG EC tablet Take 1 tablet (81 mg total) by mouth daily.  . ergocalciferol (DRISDOL) 1.25 MG (50000 UT) capsule Take 1 capsule (50,000 Units total) by mouth once a week.  . famotidine (PEPCID AC) 10 MG chewable tablet Chew 10 mg by mouth daily as needed for heartburn.  . ferrous sulfate 325 (65 FE) MG tablet Take 325 mg by mouth daily with breakfast.  . fluticasone (FLONASE) 50 MCG/ACT nasal spray USE 2 SPRAYS IN EACH NOSTRIL EVERY DAY  . fluticasone (FLOVENT HFA) 110 MCG/ACT inhaler Inhale 2 puffs into the lungs 2 (two) times daily.  . furosemide (LASIX) 20 MG tablet Take 1 tablet (20 mg total) by mouth 2 (two) times daily.  [name] levothyroxine (SYNTHROID) 150 MCG tablet Take 1 tablet (150 mcg total) by mouth daily before breakfast.  . loratadine-pseudoephedrine (CLARITIN-D 24-HOUR) 10-240 MG 24 hr tablet Take 1 tablet by mouth daily.  [name] losartan (COZAAR) 50 MG tablet TAKE 1 TABLET EVERY DAY  . OXYGEN Inhale 2 L into the lungs at bedtime as needed (at bedtime and as needed).  [name] oxymetazoline (AFRIN) 0.05 % nasal spray Place 1 spray into both nostrils 2 (two) times daily. As needed for nose bleed  . polyethylene glycol powder (GLYCOLAX/MIRALAX) powder Take 17 g by mouth 2 (two) times daily as needed. (Patient taking differently: Take 17 g by mouth daily as needed for mild constipation. )  . traMADol (ULTRAM) 50 MG tablet Take 1-2 tablets (50-100 mg total) by mouth every 4 (four) hours as needed for moderate pain.  [name] umeclidinium-vilanterol (ANORO ELLIPTA) 62.5-25 MCG/INH AEPB Inhale 1 puff into the lungs daily.  . vitamin B-12 (CYANOCOBALAMIN) 1000 MCG tablet Take 1 tablet (1,000 mcg total) by mouth daily.   No current facility-administered medications on file prior to visit.     Allergies  Allergen Reactions  . Codeine Hives and Nausea And Vomiting  . Levaquin [Levofloxacin] Nausea And Vomiting  . Penicillins Hives, Itching and Other (See  Comments)    Has patient had a PCN reaction causing immediate rash, facial/tongue/throat swelling, SOB or lightheadedness with hypotension: No Has patient had a PCN reaction causing severe rash involving mucus membranes or skin necrosis: No Has patient had a PCN reaction that required hospitalization No Has patient had a PCN reaction occurring within the last 10 years:  No If all of the above answers are "NO", then may proceed with Cephalosporin use.  [name] Zithromax [Azithromycin] Other (See Comments)    Reaction:  Hallucinations   . Prednisone Palpitations and Other (See Comments)    Reaction:  Hallucinations  Can take injections but not oral meds    Review Of Systems:  Constitutional:   No  weight loss, night sweats,  Fevers, chills,+ fatigue, or  lassitude.  HEENT:   No headaches,  Difficulty swallowing,  Tooth/dental problems, or  Sore throat,                No sneezing, itching, ear ache, nasal congestion, post nasal drip,   CV:  No chest pain,  Orthopnea, PND, improved swelling in lower extremities, No anasarca, dizziness, palpitations, syncope.   GI  No heartburn, indigestion, abdominal pain, nausea, vomiting, diarrhea, change in bowel habits, loss of appetite, bloody stools.   Resp: + shortness of breath with exertion less at rest.  No excess mucus, no productive cough,  No non-productive cough,  No coughing up of blood.  No change in color of mucus.  + wheezing.  No chest wall deformity, chest congestion and tightness  Skin: no rash or lesions.  GU: no dysuria, change in color of urine, no urgency or frequency.  No flank pain, no hematuria   MS:  No joint pain or swelling.  + decreased range of motion.  No back pain.  Psych:  No change in mood or affect. No depression or anxiety.  No memory loss.   Vital Signs BP 124/74 (BP Location: Left Arm, Cuff Size: Normal)   Pulse 90   Temp (!) 97.3 F (36.3 C) (Temporal)   Ht 5\' 2"  (1.575 m)   Wt 261 lb (118.4 kg)   SpO2 98%  Comment: on RA  BMI 47.74 kg/m    Physical Exam:  General- No distress,  A&Ox3, pleasant , in wheel chair ENT: No sinus tenderness, TM clear, pale nasal mucosa, no oral exudate,no post nasal drip, no LAN, No JVD Cardiac: S1, S2, regular rate and rhythm, no murmur Chest: No wheeze/ rales/ dullness; no accessory muscle use, no nasal flaring, no sternal retractions, slightly diminished per bases Abd.: Soft Non-tender Ext: No clubbing cyanosis, trace L ankle edema Neuro:  Deconditioned at baseline, MAE x 4, A&O x 3 Skin: No rashes, No lesions, warm and dry Psych: normal mood and behavior   Assessment/Plan COPD Plan We will give you some instruction on using the Stialto inhaler today ( Insurance change) Use Anoro until it is gone, then switch to [employer] twice daily. Rinse mouth after use. Use your albuterol nebulizer treatments to help with secretion clearance. Use your Flutter valve 3-4 blows twice daily.Nyoka Cowden  Machine) This should increase your ability to clear secretions. Continue working on increasing activity.  Continue wearing your oxygen at bedtime. We will call [employer] to come and replace your oxygen.  Follow up in 3 months with Dr. Duwayne Heck Note your daily symptoms > remember "red flags" for COPD:  Increase in cough, increase in sputum production, increase in shortness of breath or activity intolerance. If you notice these symptoms, please call to be seen.   Please contact office for sooner follow up if symptoms do not improve or worsen or seek emergency care   Heart Failure Stable echo 01/2019 Continue Lasix dosing per cardiology Low salt diet Follow up this week with cards for OV and labs  PAH right atrial pressure  of 8 mmHg, the estimated right ventricular systolic  Plan Lasix Therapy per Cards Continue oxygen at 2 L with ambulation  Nocturnal Hypoxemia Continue oxygen at 2 L [place]  with sleep   This appointment was 30 min long with over  50% of the time in direct face-to-face patient care, assessment, plan of care, and follow-up.   Magdalen Spatz, NP 06/15/2019  7:23 PM

## 2019-06-16 ENCOUNTER — Other Ambulatory Visit: Payer: Self-pay

## 2019-06-17 ENCOUNTER — Other Ambulatory Visit: Payer: Medicare HMO

## 2019-06-17 ENCOUNTER — Inpatient Hospital Stay: Payer: Medicare HMO | Admitting: Internal Medicine

## 2019-06-20 ENCOUNTER — Other Ambulatory Visit: Payer: Self-pay | Admitting: Internal Medicine

## 2019-06-20 ENCOUNTER — Ambulatory Visit (INDEPENDENT_AMBULATORY_CARE_PROVIDER_SITE_OTHER): Payer: Medicare HMO | Admitting: Internal Medicine

## 2019-06-20 ENCOUNTER — Encounter: Payer: Self-pay | Admitting: Internal Medicine

## 2019-06-20 ENCOUNTER — Other Ambulatory Visit: Payer: Self-pay

## 2019-06-20 VITALS — BP 116/82 | HR 100 | Temp 96.2°F | Resp 16 | Ht 62.0 in | Wt 257.6 lb

## 2019-06-20 DIAGNOSIS — E034 Atrophy of thyroid (acquired): Secondary | ICD-10-CM

## 2019-06-20 DIAGNOSIS — G4736 Sleep related hypoventilation in conditions classified elsewhere: Secondary | ICD-10-CM

## 2019-06-20 DIAGNOSIS — J42 Unspecified chronic bronchitis: Secondary | ICD-10-CM | POA: Diagnosis not present

## 2019-06-20 DIAGNOSIS — E669 Obesity, unspecified: Secondary | ICD-10-CM | POA: Diagnosis not present

## 2019-06-20 DIAGNOSIS — Z09 Encounter for follow-up examination after completed treatment for conditions other than malignant neoplasm: Secondary | ICD-10-CM

## 2019-06-20 DIAGNOSIS — I5032 Chronic diastolic (congestive) heart failure: Secondary | ICD-10-CM

## 2019-06-20 DIAGNOSIS — J449 Chronic obstructive pulmonary disease, unspecified: Secondary | ICD-10-CM | POA: Diagnosis not present

## 2019-06-20 DIAGNOSIS — I480 Paroxysmal atrial fibrillation: Secondary | ICD-10-CM | POA: Diagnosis not present

## 2019-06-20 DIAGNOSIS — J441 Chronic obstructive pulmonary disease with (acute) exacerbation: Secondary | ICD-10-CM

## 2019-06-20 LAB — COMPREHENSIVE METABOLIC PANEL
ALT: 26 U/L (ref 0–35)
AST: 28 U/L (ref 0–37)
Albumin: 4 g/dL (ref 3.5–5.2)
Alkaline Phosphatase: 68 U/L (ref 39–117)
BUN: 12 mg/dL (ref 6–23)
CO2: 28 mEq/L (ref 19–32)
Calcium: 7.6 mg/dL — ABNORMAL LOW (ref 8.4–10.5)
Chloride: 99 mEq/L (ref 96–112)
Creatinine, Ser: 0.89 mg/dL (ref 0.40–1.20)
GFR: 63.95 mL/min (ref >60.00)
Glucose, Bld: 97 mg/dL (ref 70–99)
Potassium: 4.5 mEq/L (ref 3.5–5.1)
Sodium: 137 mEq/L (ref 135–145)
Total Bilirubin: 0.5 mg/dL (ref 0.2–1.2)
Total Protein: 7.3 g/dL (ref 6.0–8.3)

## 2019-06-20 LAB — TSH: TSH: 1.8 u[IU]/mL (ref 0.35–4.50)

## 2019-06-20 NOTE — Progress Notes (Signed)
Subjective:  Patient ID: Lauren Bartlett, female    DOB: 09-14-1955  Age: 64 y.o. MRN: [id number]  CC: The primary encounter diagnosis was Hypothyroidism due to acquired atrophy of thyroid. Diagnoses of Hypercalcemia, PAF (paroxysmal atrial fibrillation) ([place]), Obesity (BMI 30-39.9), Chronic bronchitis, unspecified chronic bronchitis type ([place]), Chronic diastolic congestive heart failure ([place]), Hypocalcemia, Nocturnal hypoxemia due to obstructive chronic bronchitis ([place]), COPD exacerbation ([place]), and Hospital discharge follow-up were also pertinent to this visit.  HPI TROIAN BURRIS presents for [place]. This visit occurred during the SARS-CoV-2 public health emergency.  Safety protocols were in place, including screening questions prior to the visit, additional usage of staff PPE, and extensive cleaning of exam room while observing appropriate contact time as indicated for disinfecting solutions.   Patient was admitted to [facility] on March 11 with a 3 week history of wheezing, with multiple ER visits in the weeks preceding admission.   Discharge diagnosis was wheezing and  "COPD exacerbation " and potential contributing diagnosis of diastolic heart failure.  COVID TEST WAS NEGATIVE.  BNP   65.     treated with iv steroids:  Was prescribed  flovent and anoro ellipta at [facility] on March 13 with pulmonary follow up done on March 17 She was diuresed with  IV lasix during hospitalization because the home dose of  40 mg daily that she had been taking  for one week prescribed by cardiology had not helped reduce her reported  fluid retention .  Since she has been home she is taking 40 mg daily and has been restricting her salt intake.  She staes that her left leg continues to feel and look swollen.  She has no history of DVT or surgery on left lower extremity and DVT was ruled out during admission.  EF is normal by nov 2020 ECHO  Daily weights at home since discharge have been decreasing 260 initially on  March 14 Now 257.    Outpatient Medications Prior to Visit  Medication Sig Dispense Refill  . acetaminophen (TYLENOL) 500 MG tablet Take 500 mg by mouth 2 (two) times daily as needed for moderate pain or headache.    . albuterol (PROVENTIL) (2.5 MG/3ML) 0.083% nebulizer solution Take 3 mLs (2.5 mg total) by nebulization every 6 (six) hours as needed for wheezing or shortness of breath. 75 mL 1  . albuterol (VENTOLIN HFA) 108 (90 Base) MCG/ACT inhaler Inhale 2 puffs into the lungs every 6 (six) hours as needed for wheezing or shortness of breath. 6.7 g 1  . aspirin EC 81 MG EC tablet Take 1 tablet (81 mg total) by mouth daily.    . ergocalciferol (DRISDOL) 1.25 MG (50000 UT) capsule Take 1 capsule (50,000 Units total) by mouth once a week. 12 capsule 0  . famotidine (PEPCID AC) 10 MG chewable tablet Chew 10 mg by mouth daily as needed for heartburn.    . ferrous sulfate 325 (65 FE) MG tablet Take 325 mg by mouth daily with breakfast.    . fluticasone (FLONASE) 50 MCG/ACT nasal spray USE 2 SPRAYS IN EACH NOSTRIL EVERY DAY 48 g 2  . fluticasone (FLOVENT HFA) 110 MCG/ACT inhaler Inhale 2 puffs into the lungs 2 (two) times daily. 1 Inhaler 2  . furosemide (LASIX) 20 MG tablet Take 1 tablet (20 mg total) by mouth 2 (two) times daily. 90 tablet 3  . levothyroxine (SYNTHROID) 150 MCG tablet Take 1 tablet (150 mcg total) by mouth daily before breakfast. 90 tablet  0  . loratadine-pseudoephedrine (CLARITIN-D 24-HOUR) 10-240 MG 24 hr tablet Take 1 tablet by mouth daily.    [name] losartan (COZAAR) 50 MG tablet TAKE 1 TABLET EVERY DAY 90 tablet 1  . OXYGEN Inhale 2 L into the lungs at bedtime as needed (at bedtime and as needed).    [name] oxymetazoline (AFRIN) 0.05 % nasal spray Place 1 spray into both nostrils 2 (two) times daily. As needed for nose bleed    . polyethylene glycol powder (GLYCOLAX/MIRALAX) powder Take 17 g by mouth 2 (two) times daily as needed. (Patient taking differently: Take 17 g by mouth daily as  needed for mild constipation. ) 3350 g 1  . traMADol (ULTRAM) 50 MG tablet Take 1-2 tablets (50-100 mg total) by mouth every 4 (four) hours as needed for moderate pain. 30 tablet 0  . umeclidinium-vilanterol (ANORO ELLIPTA) 62.5-25 MCG/INH AEPB Inhale 1 puff into the lungs daily.    . vitamin B-12 (CYANOCOBALAMIN) 1000 MCG tablet Take 1 tablet (1,000 mcg total) by mouth daily. 90 tablet 3   No facility-administered medications prior to visit.    Review of Systems;  Patient denies headache, fevers, malaise, unintentional weight loss, skin rash, eye pain, sinus congestion and sinus pain, sore throat, dysphagia,  hemoptysis , cough, dyspnea, wheezing, chest pain, palpitations, orthopnea, edema, abdominal pain, nausea, melena, diarrhea, constipation, flank pain, dysuria, hematuria, urinary  Frequency, nocturia, numbness, tingling, seizures,  Focal weakness, Loss of consciousness,  Tremor, insomnia, depression, anxiety, and suicidal ideation.      Objective:  BP 116/82 (BP Location: Left Arm, Patient Position: Sitting, Cuff Size: Large)   Pulse 100   Temp (!) 96.2 F (35.7 C) (Temporal)   Resp 16   Ht 5\' 2"  (1.575 m)   Wt 257 lb 9.6 oz (116.8 kg)   SpO2 98%   BMI 47.12 kg/m   BP Readings from Last 3 Encounters:  06/20/19 116/82  06/15/19 124/74  06/11/19 130/77    Wt Readings from Last 3 Encounters:  06/20/19 257 lb 9.6 oz (116.8 kg)  06/15/19 261 lb (118.4 kg)  06/11/19 255 lb (115.7 kg)    General appearance: alert, cooperative and appears stated age Ears: normal TM's and external ear canals both ears Throat: lips, mucosa, and tongue normal; teeth and gums normal Neck: no adenopathy, no carotid bruit, supple, symmetrical, trachea midline and thyroid not enlarged, symmetric, no tenderness/mass/nodules Back: symmetric, no curvature. ROM normal. No CVA tenderness. Lungs: clear to auscultation bilaterally Heart: regular rate and rhythm, S1, S2 normal, no murmur, click, rub or  gallop Abdomen: soft, non-tender; bowel sounds normal; no masses,  no organomegaly Pulses: 2+ and symmetric Skin: Skin color, texture, turgor normal. No rashes or lesions. Non pitting edema noted  Lymph nodes: Cervical, supraclavicular, and axillary nodes normal.  Lab Results  Component Value Date   HGBA1C 5.5 01/26/2019   HGBA1C 5.3 07/30/2017   HGBA1C 5.1 07/14/2016    Lab Results  Component Value Date   CREATININE 0.89 06/20/2019   CREATININE 0.89 06/11/2019   CREATININE 0.82 06/10/2019    Lab Results  Component Value Date   WBC 9.4 06/11/2019   HGB 13.6 06/11/2019   HCT 42.6 06/11/2019   PLT 167 06/11/2019   GLUCOSE 97 06/20/2019   CHOL 200 01/26/2019   TRIG 111.0 01/26/2019   HDL 57.40 01/26/2019   LDLDIRECT 101.0 07/30/2017   LDLCALC 121 (H) 01/26/2019   ALT 26 06/20/2019   AST 28 06/20/2019   NA 137 06/20/2019  K 4.5 slight hemolysis 06/20/2019   CL 99 06/20/2019   CREATININE 0.89 06/20/2019   BUN 12 06/20/2019   CO2 28 06/20/2019   TSH 1.80 06/20/2019   INR 1.4 08/26/2016   HGBA1C 5.5 01/26/2019    X-ray chest PA and lateral  Result Date: 06/10/2019 CLINICAL DATA:  Shortness of breath EXAM: CHEST - 2 VIEW COMPARISON:  Yesterday FINDINGS: Normal heart size and mediastinal contours. Mitral valve replacement. Chronic elevation of the right diaphragm. There is no edema, consolidation, effusion, or pneumothorax. IMPRESSION: Improved aeration at the right base.  No evidence of acute disease. Electronically Signed   By: Monte Fantasia M.D.   On: 06/10/2019 07:52   US Venous Img Lower Bilateral (DVT)  Result Date: 06/10/2019 CLINICAL DATA:  shortness of breath and edema in the lower extremity EXAM: Bilateral LOWER EXTREMITY VENOUS DOPPLER ULTRASOUND TECHNIQUE: Gray-scale sonography with compression, as well as color and duplex ultrasound, were performed to evaluate the deep venous system(s) from the level of the common femoral vein through the popliteal and  proximal calf veins. COMPARISON:  None. FINDINGS: VENOUS Normal compressibility of the common femoral, superficial femoral, and popliteal veins, as well as the visualized calf veins. Visualized portions of profunda femoral vein and great saphenous vein unremarkable. No filling defects to suggest DVT on grayscale or color Doppler imaging. Doppler waveforms show normal direction of venous flow, normal respiratory phasicity and response to augmentation. Limited views of the contralateral common femoral vein are unremarkable. OTHER None. Limitations: none IMPRESSION: No femoropopliteal DVT nor evidence of DVT within the visualized calf veins. Electronically Signed   By: Prudencio Pair M.D.   On: 06/10/2019 08:46   DG Chest Port 1 View  Result Date: 06/09/2019 CLINICAL DATA:  Shortness of breath. Wheezing. EXAM: PORTABLE CHEST 1 VIEW COMPARISON:  Radiograph 05/29/2019 FINDINGS: Chronic elevation of right hemidiaphragm with adjacent atelectasis or scarring. Mild cardiomegaly, stable from prior. Prosthetic cardiac valve. Unchanged mediastinal contours. Mild vascular congestion without overt pulmonary edema. Subsegmental atelectasis at the left lung base. No acute airspace disease. No large pleural effusion. No pneumothorax. No acute osseous abnormalities are seen. IMPRESSION: 1. Vascular congestion. Mild cardiomegaly stable. 2. Chronic elevation of right hemidiaphragm with adjacent atelectasis or scarring. Electronically Signed   By: Keith Rake M.D.   On: 06/09/2019 12:35    Assessment & Plan:   Problem List Items Addressed This Visit      Unprioritized   Obesity (BMI 30-39.9) (Chronic)    With significant weight gain since the COVID PANDEMIC started.  Encouraged to resume walking program and low fat, low salt diet      Hypothyroidism - Primary (Chronic)     Thyroid function is WNL on current dose.  No current changes needed.  Lab Results  Component Value Date   TSH 1.80 06/20/2019          Relevant Orders   TSH (Completed)   Magnesium   COPD (chronic obstructive pulmonary disease) (HCC) (Chronic)    Recent asthma exacerbation was treated iwht inhaled and parenteral steroids to resolution.  Continue follow up with pulmonary      Chronic diastolic congestive heart failure ([place]) (Chronic)    Managed with previously managed with prn use of  lasix and losartan daily.  Currently taking 40 mg lasix daily since discharge with no change in Cr.    Lab Results  Component Value Date   CREATININE 0.89 06/20/2019   Will continue  Daily use of lasix at 20 mg  dose       Hypocalcemia    Recurrent,  With prior screening unrevealing.  PTH, vitamin D and magnesium levels are needed.  Endocrinology referral needed given recurrence.   Lab Results  Component Value Date   CALCIUM 7.6 (L) 06/20/2019   PHOS 4.2 06/11/2019        Relevant Orders   PTH, intact (no Ca)   VITAMIN D 25 Hydroxy (Vit-D Deficiency, Fractures)   Ambulatory referral to Endocrinology   Hospital discharge follow-up    Patient is stable post discharge from [facility] on March 13 and has no new issues or questions about discharge plans at the visit today for hospital follow up. All labs , imaging studies and progress notes from admission were reviewed with patient today        Nocturnal hypoxemia due to obstructive chronic bronchitis (HCC)    Continue nocturnal use of supplemental oxygen at 2 L/min      PAF (paroxysmal atrial fibrillation) (HCC)   COPD exacerbation (HCC)    RESOLVED WITH steroids and bronchodilators.  COVID TEST NEGATIVE,  FOLLOW UP WITH PULMONOLOGY FOR PFTS Last done in 2018 and incomplete due to patient's claustrophobia        Other Visit Diagnoses    Hypercalcemia       Relevant Orders   Calcium, ionized (Completed)   Comprehensive metabolic panel (Completed)      I am having Jocelyn Lamer B. Wrisley maintain her loratadine-pseudoephedrine, polyethylene glycol powder, OXYGEN, famotidine,  acetaminophen, aspirin, ferrous sulfate, vitamin B-12, traMADol, oxymetazoline, fluticasone, losartan, ergocalciferol, levothyroxine, furosemide, fluticasone, albuterol, albuterol, and Anoro Ellipta.  No orders of the defined types were placed in this encounter.   There are no discontinued medications.  Follow-up: Return in about 3 months (around 09/20/2019).   [name], MD

## 2019-06-20 NOTE — Patient Instructions (Addendum)
Reduce your Lasix dose to 20 mg daily starting  tomorrow  continue to weight yourself daily  After you empty your bladder   continue to monitor  Your weight daily and take an extra dose of lasix  if your overnight weight gain is 2 lbs,  or your weekly weekly weight gain is 5 lbs.    Get back to walking daily .  Work your way up to 20 minutes over the next 2 months.

## 2019-06-21 ENCOUNTER — Ambulatory Visit: Payer: Medicare HMO | Admitting: Family

## 2019-06-21 LAB — CALCIUM, IONIZED: Calcium, Ion: 4 mg/dL — ABNORMAL LOW (ref 4.8–5.6)

## 2019-06-21 NOTE — Assessment & Plan Note (Addendum)
Managed with previously managed with prn use of  lasix and losartan daily.  Currently taking 40 mg lasix daily since discharge with no change in Cr.    Lab Results  Component Value Date   CREATININE 0.89 06/20/2019   Will continue  Daily use of lasix at 20 mg  dose

## 2019-06-21 NOTE — Assessment & Plan Note (Signed)
  Thyroid function is WNL on current dose.  No current changes needed.  Lab Results  Component Value Date   TSH 1.80 06/20/2019

## 2019-06-21 NOTE — Assessment & Plan Note (Signed)
Continue nocturnal use of supplemental oxygen at 2 L/min

## 2019-06-21 NOTE — Assessment & Plan Note (Signed)
Recent asthma exacerbation was treated iwht inhaled and parenteral steroids to resolution.  Continue follow up with pulmonary

## 2019-06-21 NOTE — Assessment & Plan Note (Signed)
With significant weight gain since the [place] started.  Encouraged to resume walking program and low fat, low salt diet

## 2019-06-21 NOTE — Assessment & Plan Note (Addendum)
RESOLVED WITH steroids and bronchodilators.  COVID TEST NEGATIVE,  FOLLOW UP WITH PULMONOLOGY FOR PFTS Last done in 2018 and incomplete due to patient's claustrophobia

## 2019-06-21 NOTE — Assessment & Plan Note (Addendum)
Recurrent,  With prior screening unrevealing.  PTH, vitamin D and magnesium levels are needed.  Endocrinology referral needed given recurrence.   Lab Results  Component Value Date   CALCIUM 7.6 (L) 06/20/2019   PHOS 4.2 06/11/2019

## 2019-06-21 NOTE — Assessment & Plan Note (Addendum)
Patient is stable post discharge from [facility] on March 13 and has no new issues or questions about discharge plans at the visit today for hospital follow up. All labs , imaging studies and progress notes from admission were reviewed with patient today

## 2019-06-22 ENCOUNTER — Other Ambulatory Visit: Payer: Self-pay | Admitting: Internal Medicine

## 2019-06-22 DIAGNOSIS — E034 Atrophy of thyroid (acquired): Secondary | ICD-10-CM

## 2019-06-23 NOTE — Telephone Encounter (Signed)
Rx sent to mail order pharmacy  

## 2019-06-28 ENCOUNTER — Other Ambulatory Visit: Payer: Self-pay | Admitting: Internal Medicine

## 2019-06-28 NOTE — Telephone Encounter (Signed)
Got a message that the endocrine consult was closed [facility] patient cant go to [place] bc she lives in a group home and doesn't have transportation.   I don't understand something:   IF PATIENT CAN'T GO TO [place] FOR ENDOCRINOLOGY CONSULT,  THEN why isn't an alternative sought for her? PLEASE MAKE REFERRAL TO [place] ENDOCRINOLOGIST.  If this requires a 2nd consult made ;let me know

## 2019-06-29 ENCOUNTER — Other Ambulatory Visit: Payer: Medicare HMO

## 2019-07-03 NOTE — Progress Notes (Signed)
Patient ID: SAANVIKA MCPHAUL, female    DOB: 1955/08/01, 64 y.o.   MRN: [id number]  HPI  Ms Vivirito is a 64 y/o female with a history of HTN, CKD, thyroid disease, depression, COPD, anxiety, pulmonary HTN, sleep apnea, GERD and chronic heart failure.   Echo report from 02/14/2019 reviewed and showed an EF of 55-60% along with mild LVH, mild AS and moderately elevated PA pressure.   Catheterization done 06/16/16 and showed:  The left ventricular systolic function is normal.  LV end diastolic pressure is normal.  The left ventricular ejection fraction is 55-65% by visual estimate.  There is severe (4+) mitral regurgitation.  1. Normal coronary arteries. Catheter-induced spasm in the ostial right coronary artery with no significant pressure dampening. This improved with catheter pullback. 2. Normal LV systolic function. 3. Severe mitral regurgitation by left ventricular angiography with dilated left atrium. 4. Right heart catheterization showed mildly elevated filling pressures, prominent V wave on pulmonary capillary wedge pressure, moderate pulmonary hypertension and high cardiac output. RA pressure: 6 mmHg, RV pressure: 53/7, PA pressure: 52/15 with a mean of 35 mmHg, pulmonary capillary wedge pressure: 17 mmHg. Cardiac output was 6.9 with a cardiac index of 3.5  Admitted 06/09/19 due to COPD exacerbation. Intolerant to oral steroids. Needed oxygen. Negative for COVID/ influenza. LE ultrasound negative for DVT. Given IV steroids and antibiotics. Discharged after 2 days.   She presents today for her initial visit with a chief complaint of minimal shortness of breath upon moderate exertion. She describes this as chronic in nature having been present for several years. She has associated fatigue, wheezing, pedal edema, palpitations and anxiety along with this. She denies any difficulty sleeping, abdominal distention, chest pain, dizziness, cough or weight gain.   Past Medical History:  Diagnosis  Date  . Anxiety   . Bleeding from the nose   . Chronic diastolic congestive heart failure ([place])    a. 03/2016 Echo: EF 55-60%, Gr2 DD; b. 08/2016 Echo: EF 60-65%, nl diast fxn; c. 01/2019 Echo: EF 55-60%. Nl RV fxn.  . Congenital absence of one kidney   . COPD (chronic obstructive pulmonary disease) ([place])    a. 02/2017 PFT: mod/sev obstructive airway dzs w/ significant bronchodilator response.  . Depression    treated at [place]  . Dyspnea   . Emphysema/COPD ([place])   . GERD (gastroesophageal reflux disease)   . History of cardiac cath    a. 05/2016 Cath: nl cors.  . History of sciatica   . Hypertension   . Hypothyroidism   . Mitral Valve Prolapse & Severe mitral regurgitation s/p MVR    a. 03/2016 Echo: severe MVP involving the posterior leaflet, sev MR;  b. 06/2016 min invasive MV repair w/ triangular resection of flail segment (P2) of posterior leaflet, gore-tex neochord placement x 4, Sorin Memo 3D rin Annulosplasty (31mm, catalog # [id number], ser # [id number]); c. 08/2016 Echo: MV area 2.06 cm^2 (pressure 1/2 time); d. 01/2019 Echo: Mild MS w/ mean grad 39mmHg.  . Motion sickness    cars  . Pneumonia   . PONV (postoperative nausea and vomiting)   . Post-op Afib    a. 06/2016 following MV repair-->short-course amio. Coumadin d/c'd 2/2 epistaxis.  . Pulmonary hypertension ([place])    a. 08/2016 Echo: PASP 88mmHg; b. 01/2019 Echo: RVSP 45.21mmHg.  . S/P patent foramen ovale closure    a. 06/2016 @ time of MV Repair.  . Sleep apnea    O2 at night and PRN  Past Surgical History:  Procedure Laterality Date  . ABDOMINAL HYSTERECTOMY    . BREAST BIOPSY Right 2011   [facility]< benign  . CATARACT EXTRACTION W/PHACO Left 11/14/2015   Procedure: CATARACT EXTRACTION PHACO AND INTRAOCULAR LENS PLACEMENT (IOC);  Surgeon: Leandrew Koyanagi, MD;  Location: [place];  Service: Ophthalmology;  Laterality: Left;  sleep apnea [place]  . CATARACT EXTRACTION W/PHACO Right 12/19/2015   Procedure: CATARACT  EXTRACTION PHACO AND INTRAOCULAR LENS PLACEMENT (IOC);  Surgeon: Leandrew Koyanagi, MD;  Location: [place];  Service: Ophthalmology;  Laterality: Right;  ANXIETY GENEROUS IV SEDATION TORIC LEN  . COMBINED HYSTERECTOMY ABDOMINAL W/ A&P REPAIR / OOPHORECTOMY  1996   benign tumor  . INNER EAR SURGERY     bilateral  . MITRAL VALVE REPAIR Right 07/17/2016   Procedure: MINIMALLY INVASIVE MITRAL VALVE REPAIR (MVR) USING 26 SORIN MEMO 3D ANNULOPLASTY RING;  Surgeon: Rexene Alberts, MD;  Location: [place];  Service: Open Heart Surgery;  Laterality: Right;  . PATENT FORAMEN OVALE(PFO) CLOSURE N/A 07/17/2016   Procedure: PATENT FORAMEN OVALE (PFO) CLOSURE;  Surgeon: Rexene Alberts, MD;  Location: [place];  Service: Open Heart Surgery;  Laterality: N/A;  . RIGHT/LEFT HEART CATH AND CORONARY ANGIOGRAPHY Bilateral 06/16/2016   Procedure: Right/Left Heart Cath and Coronary Angiography;  Surgeon: Wellington Hampshire, MD;  Location: [place] [facility];  Service: Cardiovascular;  Laterality: Bilateral;  . TEE WITHOUT CARDIOVERSION N/A 05/19/2016   Procedure: TRANSESOPHAGEAL ECHOCARDIOGRAM (TEE);  Surgeon: Wellington Hampshire, MD;  Location: [facility];  Service: Cardiovascular;  Laterality: N/A;  . TEE WITHOUT CARDIOVERSION N/A 06/09/2016   Procedure: TRANSESOPHAGEAL ECHOCARDIOGRAM (TEE);  Surgeon: Wellington Hampshire, MD;  Location: [facility];  Service: Cardiovascular;  Laterality: N/A;  . TEE WITHOUT CARDIOVERSION N/A 06/16/2016   Procedure: TRANSESOPHAGEAL ECHOCARDIOGRAM (TEE);  Surgeon: Wellington Hampshire, MD;  Location: [facility];  Service: Cardiovascular;  Laterality: N/A;  . TEE WITHOUT CARDIOVERSION N/A 07/17/2016   Procedure: TRANSESOPHAGEAL ECHOCARDIOGRAM (TEE);  Surgeon: Rexene Alberts, MD;  Location: [place];  Service: Open Heart Surgery;  Laterality: N/A;  . TONSILLECTOMY     Family History  Problem Relation Age of Onset  . Multiple sclerosis Mother   . Hypertension Mother   . Coronary artery disease  Father   . Heart disease Father   . Hypertension Father   . Heart disease Brother   . Cancer Brother   . Breast cancer Paternal Aunt    Social History   Tobacco Use  . Smoking status: Never Smoker  . Smokeless tobacco: Never Used  Substance Use Topics  . Alcohol use: No   Allergies  Allergen Reactions  . Codeine Hives and Nausea And Vomiting  . Levaquin [Levofloxacin] Nausea And Vomiting  . Penicillins Hives, Itching and Other (See Comments)    Has patient had a PCN reaction causing immediate rash, facial/tongue/throat swelling, SOB or lightheadedness with hypotension: No Has patient had a PCN reaction causing severe rash involving mucus membranes or skin necrosis: No Has patient had a PCN reaction that required hospitalization No Has patient had a PCN reaction occurring within the last 10 years: No If all of the above answers are "NO", then may proceed with Cephalosporin use.  [name] Zithromax [Azithromycin] Other (See Comments)    Reaction:  Hallucinations   . Prednisone Palpitations and Other (See Comments)    Reaction:  Hallucinations  Can take injections but not oral meds   Prior to Admission medications   Medication Sig  Start Date End Date Taking? Authorizing Provider  acetaminophen (TYLENOL) 500 MG tablet Take 500 mg by mouth 2 (two) times daily as needed for moderate pain or headache.   Yes [provider]  albuterol (PROVENTIL) (2.5 MG/3ML) 0.083% nebulizer solution INHALE THE CONTENTS OF 1 VIAL VIA NEBULIZER EVERY 6 HOURS AS NEEDED FOR WHEEZING OR FOR SHORTNESS OF BREATH 06/23/19  Yes [name], MD  albuterol (VENTOLIN HFA) 108 (90 Base) MCG/ACT inhaler INHALE 2 PUFFS INTO THE LUNGS EVERY 6 (SIX) HOURS AS NEEDED FOR WHEEZING OR SHORTNESS OF BREATH.  (USE WITH SPACER) 06/29/19  Yes [name], MD  aspirin EC 81 MG EC tablet Take 1 tablet (81 mg total) by mouth daily. 07/23/16  Yes Barrett, Erin R, PA-C  docusate sodium (COLACE) 100 MG capsule Take 100 mg by  mouth daily as needed for mild constipation.   Yes [provider]  ergocalciferol (DRISDOL) 1.25 MG (50000 UT) capsule Take 1 capsule (50,000 Units total) by mouth once a week. 02/15/19  Yes [name], MD  famotidine (PEPCID AC) 10 MG chewable tablet Chew 20 mg by mouth daily as needed for heartburn.    Yes [provider]  ferrous sulfate 325 (65 FE) MG tablet Take 325 mg by mouth daily with breakfast.   Yes [provider]  fluticasone (FLONASE) 50 MCG/ACT nasal spray USE 2 SPRAYS IN EACH NOSTRIL EVERY DAY 01/10/19  Yes [name], MD  fluticasone (FLOVENT HFA) 110 MCG/ACT inhaler Inhale 2 puffs into the lungs 2 (two) times daily. 06/11/19 06/10/20 Yes Elodia Florence., MD  furosemide (LASIX) 20 MG tablet Take 1 tablet (20 mg total) by mouth 2 (two) times daily. 06/02/19  Yes Theora Gianotti, NP  levothyroxine (SYNTHROID) 150 MCG tablet TAKE 1 TABLET (150 MCG TOTAL) BY MOUTH DAILY BEFORE BREAKFAST. (DOSE INCREASED). 06/23/19  Yes [name], MD  loratadine-pseudoephedrine (CLARITIN-D 24-HOUR) 10-240 MG 24 hr tablet Take 1 tablet by mouth daily.   Yes [provider]  losartan (COZAAR) 50 MG tablet TAKE 1 TABLET EVERY DAY 01/14/19  Yes [name], MD  OXYGEN Inhale 2 L into the lungs at bedtime as needed (at bedtime and as needed).   Yes [provider]  oxymetazoline (AFRIN) 0.05 % nasal spray Place 1 spray into both nostrils 2 (two) times daily. As needed for nose bleed   Yes [provider]  polyethylene glycol powder (GLYCOLAX/MIRALAX) powder Take 17 g by mouth 2 (two) times daily as needed. Patient taking differently: Take 17 g by mouth daily as needed for mild constipation.  08/03/15  Yes [name], MD  traMADol (ULTRAM) 50 MG tablet Take 1-2 tablets (50-100 mg total) by mouth every 4 (four) hours as needed for moderate pain. 08/21/16  Yes [name], MD  umeclidinium-vilanterol (ANORO ELLIPTA)  62.5-25 MCG/INH AEPB Inhale 1 puff into the lungs daily.   Yes [provider]  vitamin B-12 (CYANOCOBALAMIN) 1000 MCG tablet Take 1 tablet (1,000 mcg total) by mouth daily. 08/14/16  Yes [name], MD    Review of Systems  Constitutional: Positive for fatigue. Negative for appetite change.  HENT: Positive for sneezing. Negative for congestion, postnasal drip and sore throat.   Eyes: Negative.   Respiratory: Positive for shortness of breath (minimal) and wheezing (sporadic). Negative for cough and chest tightness.   Cardiovascular: Positive for palpitations (at times) and leg swelling. Negative for chest pain.  Gastrointestinal: Negative for abdominal  distention and abdominal pain.  Endocrine: Negative.   Genitourinary: Negative.   Musculoskeletal: Negative for back pain and neck pain.  Skin: Negative.   Allergic/Immunologic: Negative.   Neurological: Negative for dizziness and light-headedness.  Hematological: Negative for adenopathy. Does not bruise/bleed easily.  Psychiatric/Behavioral: Negative for dysphoric mood and sleep disturbance (sleeping on 4 pillows along with oxygen on 2L). The patient is nervous/anxious (at times).     Vitals:   07/04/19 1214  BP: (!) 148/92  Pulse: 98  Resp: 18  SpO2: 95%  Weight: 257 lb 6 oz (116.7 kg)  Height: 5\' 3"  (1.6 m)   Wt Readings from Last 3 Encounters:  07/04/19 257 lb 6 oz (116.7 kg)  06/20/19 257 lb 9.6 oz (116.8 kg)  06/15/19 261 lb (118.4 kg)   Lab Results  Component Value Date   CREATININE 0.89 06/20/2019   CREATININE 0.89 06/11/2019   CREATININE 0.82 06/10/2019    Physical Exam Vitals and nursing note reviewed.  Constitutional:      Appearance: Normal appearance.  HENT:     Head: Normocephalic and atraumatic.  Cardiovascular:     Rate and Rhythm: Normal rate and regular rhythm.  Pulmonary:     Effort: Pulmonary effort is normal.     Breath sounds: No wheezing or rales.  Abdominal:     General: There  is no distension.     Palpations: Abdomen is soft.     Tenderness: There is no abdominal tenderness.  Musculoskeletal:        General: No tenderness.     Cervical back: Normal range of motion and neck supple.     Right lower leg: No edema.     Left lower leg: No edema.  Skin:    General: Skin is warm and dry.  Neurological:     General: No focal deficit present.     Mental Status: She is alert and oriented to person, place, and time.  Psychiatric:        Mood and Affect: Mood is anxious.        Behavior: Behavior normal.        Thought Content: Thought content normal.     Assessment & Plan:  1: Chronic HF with preserved ejection fraction- - NYHA class II - euvolemic today - weighing daily and reminded to call for an overnight weight gain of >2 pounds or a weekly weight gain of >5 pounds - not adding salt and has been reading food labels for sodium content; says that she doesn't eat fast food but does receive meals on wheels - understands that she needs to keep daily sodium content to 2000mg  sodium / day; written dietary information and low sodium cookbook given to the patient - saw cardiology Sharolyn Douglas) 06/02/19 - BNP 06/09/19 was 65.0  2: HTN- - BP mildly elevated today but she admits that she was anxious coming today - saw PCP Derrel Nip) 06/20/19 - BMP 06/20/19 reviewed and showed sodium 137, potassium 4.5, creatinine 0.89 and GFR 63.95  3: COPD-  - saw pulmonology Elie Confer) 06/15/19 - wearing oxygen at 2L at bedtime and PRN during the day   Medication bottles were reviewed.   Return in 6 weeks or sooner for any questions/problems before then.

## 2019-07-04 ENCOUNTER — Other Ambulatory Visit: Payer: Self-pay

## 2019-07-04 ENCOUNTER — Encounter: Payer: Self-pay | Admitting: Family

## 2019-07-04 ENCOUNTER — Ambulatory Visit: Payer: Medicare HMO | Attending: Family | Admitting: Family

## 2019-07-04 VITALS — BP 148/92 | HR 98 | Resp 18 | Ht 63.0 in | Wt 257.4 lb

## 2019-07-04 DIAGNOSIS — Z8249 Family history of ischemic heart disease and other diseases of the circulatory system: Secondary | ICD-10-CM | POA: Insufficient documentation

## 2019-07-04 DIAGNOSIS — Z9981 Dependence on supplemental oxygen: Secondary | ICD-10-CM | POA: Insufficient documentation

## 2019-07-04 DIAGNOSIS — Z888 Allergy status to other drugs, medicaments and biological substances status: Secondary | ICD-10-CM | POA: Insufficient documentation

## 2019-07-04 DIAGNOSIS — Z79890 Hormone replacement therapy (postmenopausal): Secondary | ICD-10-CM | POA: Insufficient documentation

## 2019-07-04 DIAGNOSIS — E039 Hypothyroidism, unspecified: Secondary | ICD-10-CM | POA: Insufficient documentation

## 2019-07-04 DIAGNOSIS — Z79899 Other long term (current) drug therapy: Secondary | ICD-10-CM | POA: Diagnosis not present

## 2019-07-04 DIAGNOSIS — I130 Hypertensive heart and chronic kidney disease with heart failure and stage 1 through stage 4 chronic kidney disease, or unspecified chronic kidney disease: Secondary | ICD-10-CM | POA: Diagnosis not present

## 2019-07-04 DIAGNOSIS — I5032 Chronic diastolic (congestive) heart failure: Secondary | ICD-10-CM

## 2019-07-04 DIAGNOSIS — Z7982 Long term (current) use of aspirin: Secondary | ICD-10-CM | POA: Insufficient documentation

## 2019-07-04 DIAGNOSIS — N189 Chronic kidney disease, unspecified: Secondary | ICD-10-CM | POA: Insufficient documentation

## 2019-07-04 DIAGNOSIS — I4891 Unspecified atrial fibrillation: Secondary | ICD-10-CM | POA: Insufficient documentation

## 2019-07-04 DIAGNOSIS — Z885 Allergy status to narcotic agent status: Secondary | ICD-10-CM | POA: Diagnosis not present

## 2019-07-04 DIAGNOSIS — Z881 Allergy status to other antibiotic agents status: Secondary | ICD-10-CM | POA: Insufficient documentation

## 2019-07-04 DIAGNOSIS — J449 Chronic obstructive pulmonary disease, unspecified: Secondary | ICD-10-CM | POA: Insufficient documentation

## 2019-07-04 DIAGNOSIS — K219 Gastro-esophageal reflux disease without esophagitis: Secondary | ICD-10-CM | POA: Insufficient documentation

## 2019-07-04 DIAGNOSIS — G4730 Sleep apnea, unspecified: Secondary | ICD-10-CM | POA: Insufficient documentation

## 2019-07-04 DIAGNOSIS — Z880 Allergy status to penicillin: Secondary | ICD-10-CM | POA: Insufficient documentation

## 2019-07-04 DIAGNOSIS — Z7951 Long term (current) use of inhaled steroids: Secondary | ICD-10-CM | POA: Insufficient documentation

## 2019-07-04 DIAGNOSIS — I10 Essential (primary) hypertension: Secondary | ICD-10-CM

## 2019-07-04 NOTE — Patient Instructions (Signed)
Continue weighing daily and call for an overnight weight gain of > 2 pounds or a weekly weight gain of >5 pounds. 

## 2019-07-05 ENCOUNTER — Other Ambulatory Visit: Payer: Self-pay

## 2019-07-05 ENCOUNTER — Other Ambulatory Visit (INDEPENDENT_AMBULATORY_CARE_PROVIDER_SITE_OTHER): Payer: Medicare HMO

## 2019-07-05 DIAGNOSIS — E034 Atrophy of thyroid (acquired): Secondary | ICD-10-CM

## 2019-07-05 LAB — MAGNESIUM: Magnesium: 1.9 mg/dL (ref 1.5–2.5)

## 2019-07-05 LAB — VITAMIN D 25 HYDROXY (VIT D DEFICIENCY, FRACTURES): VITD: 45.02 ng/mL (ref 30.00–100.00)

## 2019-07-05 LAB — TSH: TSH: 1.21 u[IU]/mL (ref 0.35–4.50)

## 2019-07-05 NOTE — Telephone Encounter (Signed)
I called pt and got a ok with pt to re sent referral to [facility] endo Dr Gabriel Carina pt agreed. I went over the location and asked if the location is ok pt was ok with it. Referral was re sent.

## 2019-07-06 ENCOUNTER — Other Ambulatory Visit: Payer: Self-pay | Admitting: Internal Medicine

## 2019-07-06 LAB — PARATHYROID HORMONE, INTACT (NO CA): PTH: 41 pg/mL (ref 14–64)

## 2019-07-06 LAB — CALCIUM, IONIZED: Calcium, Ion: 4.1 mg/dL — ABNORMAL LOW (ref 4.8–5.6)

## 2019-07-07 DIAGNOSIS — J449 Chronic obstructive pulmonary disease, unspecified: Secondary | ICD-10-CM | POA: Diagnosis not present

## 2019-07-12 DIAGNOSIS — J449 Chronic obstructive pulmonary disease, unspecified: Secondary | ICD-10-CM | POA: Diagnosis not present

## 2019-07-13 ENCOUNTER — Other Ambulatory Visit: Payer: Self-pay | Admitting: Internal Medicine

## 2019-08-06 DIAGNOSIS — J449 Chronic obstructive pulmonary disease, unspecified: Secondary | ICD-10-CM | POA: Diagnosis not present

## 2019-08-09 ENCOUNTER — Other Ambulatory Visit: Payer: Self-pay

## 2019-08-09 MED ORDER — FLUTICASONE PROPIONATE HFA 110 MCG/ACT IN AERO: 2.0000 | INHALATION_SPRAY | Freq: Two times a day (BID) | RESPIRATORY_TRACT | 2 refills | Status: DC

## 2019-08-09 MED ORDER — ERGOCALCIFEROL 1.25 MG (50000 UT) PO CAPS: 50000.0000 [IU] | ORAL_CAPSULE | ORAL | 0 refills | Status: DC

## 2019-08-11 DIAGNOSIS — J449 Chronic obstructive pulmonary disease, unspecified: Secondary | ICD-10-CM | POA: Diagnosis not present

## 2019-08-12 ENCOUNTER — Telehealth: Payer: Self-pay | Admitting: Internal Medicine

## 2019-08-12 NOTE — Telephone Encounter (Signed)
Patient needs her stioltorespinat, inhaler refilled through [facility]. I do not see this on patients medication list.

## 2019-08-12 NOTE — Telephone Encounter (Signed)
Please CALL PATIENT,  I DO NOT SEE IT EITHER

## 2019-08-12 NOTE — Telephone Encounter (Signed)
Left message for patient to return call back.  

## 2019-08-15 ENCOUNTER — Ambulatory Visit: Payer: Medicare HMO | Admitting: Family

## 2019-08-15 MED ORDER — STIOLTO RESPIMAT 2.5-2.5 MCG/ACT IN AERS: 2.0000 | INHALATION_SPRAY | Freq: Two times a day (BID) | RESPIRATORY_TRACT | 2 refills | Status: DC

## 2019-08-16 ENCOUNTER — Other Ambulatory Visit: Payer: Self-pay | Admitting: Internal Medicine

## 2019-08-17 NOTE — Progress Notes (Signed)
Patient ID: Lauren Bartlett, female    DOB: 06-Aug-1955, 64 y.o.   MRN: [id number]  HPI  Ms Tennis is a 64 y/o female with a history of HTN, CKD, thyroid disease, depression, COPD, anxiety, pulmonary HTN, sleep apnea, GERD and chronic heart failure.   Echo report from 02/14/2019 reviewed and showed an EF of 55-60% along with mild LVH, mild AS and moderately elevated PA pressure.   Catheterization done 06/16/16 and showed:  The left ventricular systolic function is normal.  LV end diastolic pressure is normal.  The left ventricular ejection fraction is 55-65% by visual estimate.  There is severe (4+) mitral regurgitation.  1. Normal coronary arteries. Catheter-induced spasm in the ostial right coronary artery with no significant pressure dampening. This improved with catheter pullback. 2. Normal LV systolic function. 3. Severe mitral regurgitation by left ventricular angiography with dilated left atrium. 4. Right heart catheterization showed mildly elevated filling pressures, prominent V wave on pulmonary capillary wedge pressure, moderate pulmonary hypertension and high cardiac output. RA pressure: 6 mmHg, RV pressure: 53/7, PA pressure: 52/15 with a mean of 35 mmHg, pulmonary capillary wedge pressure: 17 mmHg. Cardiac output was 6.9 with a cardiac index of 3.5  Admitted 06/09/19 due to COPD exacerbation. Intolerant to oral steroids. Needed oxygen. Negative for COVID/ influenza. LE ultrasound negative for DVT. Given IV steroids and antibiotics. Discharged after 2 days.   She presents today for a follow-up visit with a chief complaint of minimal fatigue upon moderate exertion. She describes this as chronic in nature having been present for several years. She has associated shortness of breath, wheezing and sneezing along with this. She denies any difficulty sleeping, dizziness, abdominal distention, palpitations, pedal edema, chest pain, cough or weight gain.   Past Medical History:  Diagnosis  Date  . Anxiety   . Bleeding from the nose   . Chronic diastolic congestive heart failure ([place])    a. 03/2016 Echo: EF 55-60%, Gr2 DD; b. 08/2016 Echo: EF 60-65%, nl diast fxn; c. 01/2019 Echo: EF 55-60%. Nl RV fxn.  . Congenital absence of one kidney   . COPD (chronic obstructive pulmonary disease) ([place])    a. 02/2017 PFT: mod/sev obstructive airway dzs w/ significant bronchodilator response.  . Depression    treated at [place]  . Dyspnea   . Emphysema/COPD ([place])   . GERD (gastroesophageal reflux disease)   . History of cardiac cath    a. 05/2016 Cath: nl cors.  . History of sciatica   . Hypertension   . Hypothyroidism   . Mitral Valve Prolapse & Severe mitral regurgitation s/p MVR    a. 03/2016 Echo: severe MVP involving the posterior leaflet, sev MR;  b. 06/2016 min invasive MV repair w/ triangular resection of flail segment (P2) of posterior leaflet, gore-tex neochord placement x 4, Sorin Memo 3D rin Annulosplasty (46mm, catalog # [id number], ser # [id number]); c. 08/2016 Echo: MV area 2.06 cm^2 (pressure 1/2 time); d. 01/2019 Echo: Mild MS w/ mean grad 69mmHg.  . Motion sickness    cars  . Pneumonia   . PONV (postoperative nausea and vomiting)   . Post-op Afib    a. 06/2016 following MV repair-->short-course amio. Coumadin d/c'd 2/2 epistaxis.  . Pulmonary hypertension ([place])    a. 08/2016 Echo: PASP 66mmHg; b. 01/2019 Echo: RVSP 45.59mmHg.  . S/P patent foramen ovale closure    a. 06/2016 @ time of MV Repair.  . Sleep apnea    O2 at night and PRN  Past Surgical History:  Procedure Laterality Date  . ABDOMINAL HYSTERECTOMY    . BREAST BIOPSY Right 2011   [facility]< benign  . CATARACT EXTRACTION W/PHACO Left 11/14/2015   Procedure: CATARACT EXTRACTION PHACO AND INTRAOCULAR LENS PLACEMENT (IOC);  Surgeon: Leandrew Koyanagi, MD;  Location: [facility];  Service: Ophthalmology;  Laterality: Left;  sleep apnea [place]  . CATARACT EXTRACTION W/PHACO Right 12/19/2015   Procedure: CATARACT  EXTRACTION PHACO AND INTRAOCULAR LENS PLACEMENT (IOC);  Surgeon: Leandrew Koyanagi, MD;  Location: [facility];  Service: Ophthalmology;  Laterality: Right;  ANXIETY GENEROUS IV SEDATION TORIC LEN  . COMBINED HYSTERECTOMY ABDOMINAL W/ A&P REPAIR / OOPHORECTOMY  1996   benign tumor  . INNER EAR SURGERY     bilateral  . MITRAL VALVE REPAIR Right 07/17/2016   Procedure: MINIMALLY INVASIVE MITRAL VALVE REPAIR (MVR) USING 26 SORIN MEMO 3D ANNULOPLASTY RING;  Surgeon: Rexene Alberts, MD;  Location: [place];  Service: Open Heart Surgery;  Laterality: Right;  . PATENT FORAMEN OVALE(PFO) CLOSURE N/A 07/17/2016   Procedure: PATENT FORAMEN OVALE (PFO) CLOSURE;  Surgeon: Rexene Alberts, MD;  Location: [facility];  Service: Open Heart Surgery;  Laterality: N/A;  . RIGHT/LEFT HEART CATH AND CORONARY ANGIOGRAPHY Bilateral 06/16/2016   Procedure: Right/Left Heart Cath and Coronary Angiography;  Surgeon: Wellington Hampshire, MD;  Location: [facility];  Service: Cardiovascular;  Laterality: Bilateral;  . TEE WITHOUT CARDIOVERSION N/A 05/19/2016   Procedure: TRANSESOPHAGEAL ECHOCARDIOGRAM (TEE);  Surgeon: Wellington Hampshire, MD;  Location: [facility];  Service: Cardiovascular;  Laterality: N/A;  . TEE WITHOUT CARDIOVERSION N/A 06/09/2016   Procedure: TRANSESOPHAGEAL ECHOCARDIOGRAM (TEE);  Surgeon: Wellington Hampshire, MD;  Location: [facility];  Service: Cardiovascular;  Laterality: N/A;  . TEE WITHOUT CARDIOVERSION N/A 06/16/2016   Procedure: TRANSESOPHAGEAL ECHOCARDIOGRAM (TEE);  Surgeon: Wellington Hampshire, MD;  Location: [facility];  Service: Cardiovascular;  Laterality: N/A;  . TEE WITHOUT CARDIOVERSION N/A 07/17/2016   Procedure: TRANSESOPHAGEAL ECHOCARDIOGRAM (TEE);  Surgeon: Rexene Alberts, MD;  Location: [place];  Service: Open Heart Surgery;  Laterality: N/A;  . TONSILLECTOMY     Family History  Problem Relation Age of Onset  . Multiple sclerosis Mother   . Hypertension Mother   . Coronary artery disease  Father   . Heart disease Father   . Hypertension Father   . Heart disease Brother   . Cancer Brother   . Breast cancer Paternal Aunt    Social History   Tobacco Use  . Smoking status: Never Smoker  . Smokeless tobacco: Never Used  Substance Use Topics  . Alcohol use: No   Allergies  Allergen Reactions  . Codeine Hives and Nausea And Vomiting  . Levaquin [Levofloxacin] Nausea And Vomiting  . Penicillins Hives, Itching and Other (See Comments)    Has patient had a PCN reaction causing immediate rash, facial/tongue/throat swelling, SOB or lightheadedness with hypotension: No Has patient had a PCN reaction causing severe rash involving mucus membranes or skin necrosis: No Has patient had a PCN reaction that required hospitalization No Has patient had a PCN reaction occurring within the last 10 years: No If all of the above answers are "NO", then may proceed with Cephalosporin use.  [name] Zithromax [Azithromycin] Other (See Comments)    Reaction:  Hallucinations   . Prednisone Palpitations and Other (See Comments)    Reaction:  Hallucinations  Can take injections but not oral meds   Prior to Admission medications   Medication Sig  Start Date End Date Taking? Authorizing Provider  acetaminophen (TYLENOL) 500 MG tablet Take 500 mg by mouth 2 (two) times daily as needed for moderate pain or headache.   Yes [provider]  albuterol (PROVENTIL) (2.5 MG/3ML) 0.083% nebulizer solution INHALE THE CONTENTS OF 1 VIAL VIA NEBULIZER EVERY 6 HOURS AS NEEDED FOR WHEEZING OR FOR SHORTNESS OF BREATH  07/07/19  Yes [name], MD  albuterol (VENTOLIN HFA) 108 (90 Base) MCG/ACT inhaler INHALE 2 PUFFS INTO THE LUNGS EVERY 6 (SIX) HOURS AS NEEDED FOR WHEEZING OR SHORTNESS OF BREATH.  (USE WITH SPACER) 06/29/19  Yes [name], MD  aspirin EC 81 MG EC tablet Take 1 tablet (81 mg total) by mouth daily. 07/23/16  Yes Barrett, Erin R, PA-C  Calcium Carb-Cholecalciferol 600-800 MG-UNIT TABS Take 1  tablet by mouth in the morning and at bedtime.   Yes [provider]  docusate sodium (COLACE) 100 MG capsule Take 100 mg by mouth daily as needed for mild constipation.   Yes [provider]  ergocalciferol (DRISDOL) 1.25 MG (50000 UT) capsule Take 1 capsule (50,000 Units total) by mouth once a week. 08/09/19  Yes [name], MD  famotidine (PEPCID AC) 10 MG chewable tablet Chew 20 mg by mouth daily as needed for heartburn.    Yes [provider]  ferrous sulfate 325 (65 FE) MG tablet Take 325 mg by mouth daily with breakfast.   Yes [provider]  fluticasone (FLONASE) 50 MCG/ACT nasal spray USE 2 SPRAYS IN EACH NOSTRIL EVERY DAY 01/10/19  Yes [name], MD  fluticasone (FLOVENT HFA) 110 MCG/ACT inhaler Inhale 2 puffs into the lungs 2 (two) times daily. 08/09/19 08/08/20 Yes [name], MD  furosemide (LASIX) 20 MG tablet Take 1 tablet (20 mg total) by mouth 2 (two) times daily. 06/02/19  Yes Theora Gianotti, NP  levothyroxine (SYNTHROID) 150 MCG tablet TAKE 1 TABLET (150 MCG TOTAL) BY MOUTH DAILY BEFORE BREAKFAST. (DOSE INCREASED). 06/23/19  Yes [name], MD  loratadine (CLARITIN) 10 MG tablet Take 10 mg by mouth daily.   Yes [provider]  losartan (COZAAR) 50 MG tablet TAKE 1 TABLET (50 MG TOTAL) BY MOUTH DAILY. 08/16/19  Yes [name], MD  OXYGEN Inhale 2 L into the lungs at bedtime as needed (at bedtime and as needed).   Yes [provider]  polyethylene glycol powder (GLYCOLAX/MIRALAX) powder Take 17 g by mouth 2 (two) times daily as needed. Patient taking differently: Take 17 g by mouth daily as needed for mild constipation.  08/03/15  Yes [name], MD  STIOLTO RESPIMAT 2.5-2.5 MCG/ACT AERS Inhale 2 puffs into the lungs in the morning and at bedtime. 08/15/19  Yes [name], MD  traMADol (ULTRAM) 50 MG tablet Take 1-2 tablets (50-100 mg total) by mouth every 4 (four) hours as needed for moderate  pain. 08/21/16  Yes [name], MD  vitamin B-12 (CYANOCOBALAMIN) 1000 MCG tablet Take 1 tablet (1,000 mcg total) by mouth daily. 08/14/16  Yes [name], MD  oxymetazoline (AFRIN) 0.05 % nasal spray Place 1 spray into both nostrils 2 (two) times daily. As needed for nose bleed    [provider]     Review of Systems  Constitutional: Positive for fatigue. Negative for appetite change.  HENT: Positive for sneezing. Negative for congestion, postnasal drip and sore throat.   Eyes: Negative.   Respiratory: Positive for shortness of breath (  minimal) and wheezing (sporadic). Negative for cough and chest tightness.   Cardiovascular: Negative for chest pain, palpitations and leg swelling.  Gastrointestinal: Negative for abdominal distention and abdominal pain.  Endocrine: Negative.   Genitourinary: Negative.   Musculoskeletal: Negative for back pain and neck pain.  Skin: Negative.   Allergic/Immunologic: Negative.   Neurological: Negative for dizziness and light-headedness.  Hematological: Negative for adenopathy. Does not bruise/bleed easily.  Psychiatric/Behavioral: Negative for dysphoric mood and sleep disturbance (sleeping on 3 pillows along with oxygen on 2L). The patient is not nervous/anxious.    Vitals:   08/18/19 1049  BP: (!) 141/88  Pulse: 82  Resp: 20  SpO2: 95%  Weight: 252 lb 4 oz (114.4 kg)  Height: 5\' 2"  (1.575 m)   Wt Readings from Last 3 Encounters:  08/18/19 252 lb 4 oz (114.4 kg)  07/04/19 257 lb 6 oz (116.7 kg)  06/20/19 257 lb 9.6 oz (116.8 kg)   Lab Results  Component Value Date   CREATININE 0.89 06/20/2019   CREATININE 0.89 06/11/2019   CREATININE 0.82 06/10/2019    Physical Exam Vitals and nursing note reviewed.  Constitutional:      Appearance: Normal appearance.  HENT:     Head: Normocephalic and atraumatic.  Cardiovascular:     Rate and Rhythm: Normal rate and regular rhythm.  Pulmonary:     Effort: Pulmonary effort is normal.      Breath sounds: No wheezing or rales.  Abdominal:     General: There is no distension.     Palpations: Abdomen is soft.     Tenderness: There is no abdominal tenderness.  Musculoskeletal:        General: No tenderness.     Cervical back: Normal range of motion and neck supple.     Right lower leg: Edema (trace pitting) present.     Left lower leg: Edema (trace pitting) present.  Skin:    General: Skin is warm and dry.  Neurological:     General: No focal deficit present.     Mental Status: She is alert and oriented to person, place, and time.  Psychiatric:        Mood and Affect: Mood normal.        Behavior: Behavior normal.        Thought Content: Thought content normal.    Assessment & Plan:  1: Chronic HF with preserved ejection fraction- - NYHA class II - euvolemic today - weighing daily and reminded to call for an overnight weight gain of >2 pounds or a weekly weight gain of >5 pounds - weight down 5.2 pounds from last visit here 6 weeks ago - not adding salt and has been reading food labels for sodium content; says that she doesn't eat fast food but does receive meals on wheels - understands that she needs to keep daily sodium content to 2000mg  sodium / day - encouraged her to elevate her legs when sitting for long periods of time - saw cardiology Sharolyn Douglas) 06/02/19 - BNP 06/09/19 was 65.0 - PharmD reconciled medications with the patient - received her first COVID vaccine and has her second one scheduled  2: HTN- - BP mildly elevated today but says that at home, it's lower - saw PCP Derrel Nip) 06/20/19 - BMP 06/20/19 reviewed and showed sodium 137, potassium 4.5, creatinine 0.89 and GFR 63.95  3: COPD-  - saw pulmonology Elie Confer) 06/15/19 - wearing oxygen at 2L at bedtime and PRN during the day   Medication bottles  were reviewed.   Return in 6 months or sooner for any questions/problems before then.

## 2019-08-18 ENCOUNTER — Other Ambulatory Visit: Payer: Self-pay

## 2019-08-18 ENCOUNTER — Ambulatory Visit: Payer: Medicare HMO | Attending: Family | Admitting: Family

## 2019-08-18 ENCOUNTER — Encounter: Payer: Self-pay | Admitting: Family

## 2019-08-18 VITALS — BP 141/88 | HR 82 | Resp 20 | Ht 62.0 in | Wt 252.2 lb

## 2019-08-18 DIAGNOSIS — I130 Hypertensive heart and chronic kidney disease with heart failure and stage 1 through stage 4 chronic kidney disease, or unspecified chronic kidney disease: Secondary | ICD-10-CM | POA: Diagnosis not present

## 2019-08-18 DIAGNOSIS — Z79899 Other long term (current) drug therapy: Secondary | ICD-10-CM | POA: Diagnosis not present

## 2019-08-18 DIAGNOSIS — Z8249 Family history of ischemic heart disease and other diseases of the circulatory system: Secondary | ICD-10-CM | POA: Insufficient documentation

## 2019-08-18 DIAGNOSIS — E039 Hypothyroidism, unspecified: Secondary | ICD-10-CM | POA: Insufficient documentation

## 2019-08-18 DIAGNOSIS — Z7982 Long term (current) use of aspirin: Secondary | ICD-10-CM | POA: Insufficient documentation

## 2019-08-18 DIAGNOSIS — Z881 Allergy status to other antibiotic agents status: Secondary | ICD-10-CM | POA: Insufficient documentation

## 2019-08-18 DIAGNOSIS — I10 Essential (primary) hypertension: Secondary | ICD-10-CM

## 2019-08-18 DIAGNOSIS — Z885 Allergy status to narcotic agent status: Secondary | ICD-10-CM | POA: Insufficient documentation

## 2019-08-18 DIAGNOSIS — Z8774 Personal history of (corrected) congenital malformations of heart and circulatory system: Secondary | ICD-10-CM | POA: Diagnosis not present

## 2019-08-18 DIAGNOSIS — Z880 Allergy status to penicillin: Secondary | ICD-10-CM | POA: Diagnosis not present

## 2019-08-18 DIAGNOSIS — Z888 Allergy status to other drugs, medicaments and biological substances status: Secondary | ICD-10-CM | POA: Insufficient documentation

## 2019-08-18 DIAGNOSIS — Z79890 Hormone replacement therapy (postmenopausal): Secondary | ICD-10-CM | POA: Insufficient documentation

## 2019-08-18 DIAGNOSIS — N189 Chronic kidney disease, unspecified: Secondary | ICD-10-CM | POA: Insufficient documentation

## 2019-08-18 DIAGNOSIS — I5032 Chronic diastolic (congestive) heart failure: Secondary | ICD-10-CM

## 2019-08-18 DIAGNOSIS — Z7951 Long term (current) use of inhaled steroids: Secondary | ICD-10-CM | POA: Insufficient documentation

## 2019-08-18 DIAGNOSIS — K219 Gastro-esophageal reflux disease without esophagitis: Secondary | ICD-10-CM | POA: Diagnosis not present

## 2019-08-18 DIAGNOSIS — Z9981 Dependence on supplemental oxygen: Secondary | ICD-10-CM | POA: Diagnosis not present

## 2019-08-18 DIAGNOSIS — J449 Chronic obstructive pulmonary disease, unspecified: Secondary | ICD-10-CM | POA: Insufficient documentation

## 2019-08-18 NOTE — Patient Instructions (Signed)
Continue weighing daily and call for an overnight weight gain of > 2 pounds or a weekly weight gain of >5 pounds. 

## 2019-08-18 NOTE — Progress Notes (Signed)
[facility] - PHARMACIST COUNSELING NOTE  ADHERENCE ASSESSMENT  Adherence strategy: Patient takes medications from pill box. Brought medications with her to visit today.    Do you ever forget to take your medication? [] Yes (1) [x] No (0)  Do you ever skip doses due to side effects? [] Yes (1) [x] No (0)  Do you have trouble affording your medicines? [] Yes (1) [x] No (0)  Are you ever unable to pick up your medication due to transportation difficulties? [] Yes (1) [x] No (0)  Do you ever stop taking your medications because you don't believe they are helping? [] Yes (1) [x] No (0)  Total score 0   Recommendations given to patient about increasing adherence: None needed  Guideline-Directed Medical Therapy/Evidence Based Medicine  ACE/ARB/ARNI: Losartan 50 mg daily Beta Blocker: None Aldosterone Antagonist: None Diuretic: Furosemide 20 mg BID    SUBJECTIVE  HPI: Patient is a 64 y/o F with PMH as below who presents to CHF clinic for follow-up. She was hospitalized 3/11-3/13 for COPD exacerbation with possible contribution from HF.   Past Medical History:  Diagnosis Date  . Anxiety   . Bleeding from the nose   . Chronic diastolic congestive heart failure ([place])    a. 03/2016 Echo: EF 55-60%, Gr2 DD; b. 08/2016 Echo: EF 60-65%, nl diast fxn; c. 01/2019 Echo: EF 55-60%. Nl RV fxn.  . Congenital absence of one kidney   . COPD (chronic obstructive pulmonary disease) ([place])    a. 02/2017 PFT: mod/sev obstructive airway dzs w/ significant bronchodilator response.  . Depression    treated at [facility]  . Dyspnea   . Emphysema/COPD ([place])   . GERD (gastroesophageal reflux disease)   . History of cardiac cath    a. 05/2016 Cath: nl cors.  . History of sciatica   . Hypertension   . Hypothyroidism   . Mitral Valve Prolapse & Severe mitral regurgitation s/p MVR    a. 03/2016 Echo: severe MVP involving the posterior leaflet, sev MR;  b. 06/2016 min  invasive MV repair w/ triangular resection of flail segment (P2) of posterior leaflet, gore-tex neochord placement x 4, Sorin Memo 3D rin Annulosplasty (73mm, catalog # [id number], ser # [id number]); c. 08/2016 Echo: MV area 2.06 cm^2 (pressure 1/2 time); d. 01/2019 Echo: Mild MS w/ mean grad 56mmHg.  . Motion sickness    cars  . Pneumonia   . PONV (postoperative nausea and vomiting)   . Post-op Afib    a. 06/2016 following MV repair-->short-course amio. Coumadin d/c'd 2/2 epistaxis.  . Pulmonary hypertension ([place])    a. 08/2016 Echo: PASP 31mmHg; b. 01/2019 Echo: RVSP 45.93mmHg.  . S/P patent foramen ovale closure    a. 06/2016 @ time of MV Repair.  . Sleep apnea    O2 at night and PRN        OBJECTIVE   Vital signs: HR 82, BP 141/88, weight (pounds) 252 ECHO: Date 02/14/19, EF 55-60%, notes: Mild LVH, evidence of mitral valve replacement with no regurgitation noted, moderately elevated PA systolic pressure Cath: Date 06/16/16, EF 55-65%, notes: normal coronary arteries, severe mitral regurgitation  BMP Latest Ref Rng & Units 06/20/2019 06/11/2019 06/10/2019  Glucose 70 - 99 mg/dL 97 105(H) 111(H)  BUN 6 - 23 mg/dL 12 30(H) 18  Creatinine 0.40 - 1.20 mg/dL 0.89 0.89 0.82  BUN/Creat Ratio 12 - 28 - - -  Sodium 135 - 145 mEq/L 137 137 140  Potassium 3.5 - 5.1 mEq/L 4.5 slight hemolysis 4.0  4.4  Chloride 96 - 112 mEq/L 99 99 103  CO2 19 - 32 mEq/L 28 28 28   Calcium 8.4 - 10.5 mg/dL 7.6(L) 7.2(L) 7.5(L)    ASSESSMENT  Patient is well appearing in no acute distress. Endorses shortness of breath upon exertion. She endorses taking medications daily as prescribed. Denies missed doses / adverse effects of therapy. She endorses weighing herself daily at home with weights ranging 252-253 lbs.  PLAN  1). CHF (EF 55-60%) -Furosemide 20 mg BID for fluid management -Continue low sodium diet -Continue daily weights -Discussed with provider - no changes to medication regimen today  2).  Hypertension -Losartan 50 mg daily, furosemide 20 mg BID -Checks BP at home which she reports has been normal -Denies signs/symptoms of hypotension (dizziness / faintness) or hypertension (headache / blurry vision)  3). COPD -Stiolto respimat 2 puff BID, Flovent 2 puff BID, albuterol HFA PRN, albuterol nebs PRN, wears supplemental oxygen at night -Reports she is a never-smoker -Endorses swish + spit after using Flovent  4). Hypothyroidism -Levothyroxine 150 mcg QAM -TSH 1.21 on 07/05/19  5). Nutritional supplements -Calcium carbonate 600 mg + 800 units D3 BID, ergocalciferol 50k units weekly, ferrous sulfate 325 mg QAM, vitamin B12 1000 mcg daily  6). GERD -Famotidine 20 mg daily PRN  7). Pain -Tramadol 50-100 mg q4h PRN  8). Allergies -Loratadine 10 mg daily, Flonase 2 sprays NS daily  9). Constipation -Miralax 17 g BID PRN, docusate 100 mg BID PRN  10). Primary ASCVD prophylaxis -Aspirin 81 mg daily   Time spent: 15 minutes  [place] Resident 08/18/2019 10:28 AM    Current Outpatient Medications:  .  acetaminophen (TYLENOL) 500 MG tablet, Take 500 mg by mouth 2 (two) times daily as needed for moderate pain or headache., Disp: , Rfl:  .  albuterol (PROVENTIL) (2.5 MG/3ML) 0.083% nebulizer solution, INHALE THE CONTENTS OF 1 VIAL VIA NEBULIZER EVERY 6 HOURS AS NEEDED FOR WHEEZING OR FOR SHORTNESS OF BREATH , Disp: 90 mL, Rfl: 1 .  albuterol (VENTOLIN HFA) 108 (90 Base) MCG/ACT inhaler, INHALE 2 PUFFS INTO THE LUNGS EVERY 6 (SIX) HOURS AS NEEDED FOR WHEEZING OR SHORTNESS OF BREATH.  (USE WITH SPACER), Disp: 18 g, Rfl: 1 .  aspirin EC 81 MG EC tablet, Take 1 tablet (81 mg total) by mouth daily., Disp: , Rfl:  .  docusate sodium (COLACE) 100 MG capsule, Take 100 mg by mouth daily as needed for mild constipation., Disp: , Rfl:  .  ergocalciferol (DRISDOL) 1.25 MG (50000 UT) capsule, Take 1 capsule (50,000 Units total) by mouth once a week., Disp: 12 capsule,  Rfl: 0 .  famotidine (PEPCID AC) 10 MG chewable tablet, Chew 20 mg by mouth daily as needed for heartburn. , Disp: , Rfl:  .  ferrous sulfate 325 (65 FE) MG tablet, Take 325 mg by mouth daily with breakfast., Disp: , Rfl:  .  fluticasone (FLONASE) 50 MCG/ACT nasal spray, USE 2 SPRAYS IN EACH NOSTRIL EVERY DAY, Disp: 48 g, Rfl: 2 .  fluticasone (FLOVENT HFA) 110 MCG/ACT inhaler, Inhale 2 puffs into the lungs 2 (two) times daily., Disp: 1 Inhaler, Rfl: 2 .  furosemide (LASIX) 20 MG tablet, Take 1 tablet (20 mg total) by mouth 2 (two) times daily., Disp: 90 tablet, Rfl: 3 .  levothyroxine (SYNTHROID) 150 MCG tablet, TAKE 1 TABLET (150 MCG TOTAL) BY MOUTH DAILY BEFORE BREAKFAST. (DOSE INCREASED)., Disp: 90 tablet, Rfl: 0 .  loratadine-pseudoephedrine (CLARITIN-D 24-HOUR) 10-240 MG 24  hr tablet, Take 1 tablet by mouth daily., Disp: , Rfl:  .  losartan (COZAAR) 50 MG tablet, TAKE 1 TABLET (50 MG TOTAL) BY MOUTH DAILY., Disp: 90 tablet, Rfl: 1 .  OXYGEN, Inhale 2 L into the lungs at bedtime as needed (at bedtime and as needed)., Disp: , Rfl:  .  oxymetazoline (AFRIN) 0.05 % nasal spray, Place 1 spray into both nostrils 2 (two) times daily. As needed for nose bleed, Disp: , Rfl:  .  polyethylene glycol powder (GLYCOLAX/MIRALAX) powder, Take 17 g by mouth 2 (two) times daily as needed. (Patient taking differently: Take 17 g by mouth daily as needed for mild constipation. ), Disp: 3350 g, Rfl: 1 .  STIOLTO RESPIMAT 2.5-2.5 MCG/ACT AERS, Inhale 2 puffs into the lungs in the morning and at bedtime., Disp: 4 g, Rfl: 2 .  traMADol (ULTRAM) 50 MG tablet, Take 1-2 tablets (50-100 mg total) by mouth every 4 (four) hours as needed for moderate pain., Disp: 30 tablet, Rfl: 0 .  vitamin B-12 (CYANOCOBALAMIN) 1000 MCG tablet, Take 1 tablet (1,000 mcg total) by mouth daily., Disp: 90 tablet, Rfl: 3   COUNSELING POINTS/CLINICAL PEARLS  Losartan (Goal: 150 mg once daily)  Warn female patient to avoid pregnancy and to  report a pregnancy that occurs during therapy.  Side effects may include dizziness, upper respiratory infection, nasal congestion, and back pain.  Warn patient to avoid use of potassium supplements or potassium-containing salt substitutes unless they consult healthcare provider. Furosemide  Drug causes sun-sensitivity. Advise patient to use sunscreen and avoid tanning beds. Patient should avoid activities requiring coordination until drug effects are realized, as drug may cause dizziness, vertigo, or blurred vision. This drug may cause hyperglycemia, hyperuricemia, constipation, diarrhea, loss of appetite, nausea, vomiting, purpuric disorder, cramps, spasticity, asthenia, headache, paresthesia, or scaling eczema. Instruct patient to report unusual bleeding/bruising or signs/symptoms of hypotension, infection, pancreatitis, or ototoxicity (tinnitus, hearing impairment). Advise patient to report signs/symptoms of a severe skin reactions (flu-like symptoms, spreading red rash, or skin/mucous membrane blistering) or erythema multiforme. Instruct patient to eat high-potassium foods during drug therapy, as directed by healthcare professional.  Patient should not drink alcohol while taking this drug.  DRUGS TO AVOID IN HEART FAILURE  Drug or Class Mechanism  Analgesics . NSAIDs . COX-2 inhibitors . Glucocorticoids  Sodium and water retention, increased systemic vascular resistance, decreased response to diuretics   Diabetes Medications . Metformin . Thiazolidinediones o Rosiglitazone (Avandia) o Pioglitazone (Actos) . DPP4 Inhibitors o Saxagliptin (Onglyza) o Sitagliptin (Januvia)   Lactic acidosis Possible calcium channel blockade   Unknown  Antiarrhythmics . Class I  o Flecainide o Disopyramide . Class III o Sotalol . Other o Dronedarone  Negative inotrope, proarrhythmic   Proarrhythmic, beta blockade  Negative inotrope  Antihypertensives . Alpha  Blockers o Doxazosin . Calcium Channel Blockers o Diltiazem o Verapamil o Nifedipine . Central Alpha Adrenergics o Moxonidine . Peripheral Vasodilators o Minoxidil  Increases renin and aldosterone  Negative inotrope    Possible sympathetic withdrawal  Unknown  Anti-infective . Itraconazole . Amphotericin B  Negative inotrope Unknown  Hematologic . Anagrelide . Cilostazol   Possible inhibition of PD IV Inhibition of PD III causing arrhythmias  Neurologic/Psychiatric . Stimulants . Anti-Seizure Drugs o Carbamazepine o Pregabalin . Antidepressants o Tricyclics o Citalopram . Parkinsons o Bromocriptine o Pergolide o Pramipexole . Antipsychotics o Clozapine . Antimigraine o Ergotamine o Methysergide . Appetite suppressants . Bipolar o Lithium  Peripheral alpha and beta agonist activity  Negative inotrope and chronotrope Calcium channel blockade  Negative inotrope, proarrhythmic Dose-dependent QT prolongation  Excessive serotonin activity/valvular damage Excessive serotonin activity/valvular damage Unknown  IgE mediated hypersensitivy, calcium channel blockade  Excessive serotonin activity/valvular damage Excessive serotonin activity/valvular damage Valvular damage  Direct myofibrillar degeneration, adrenergic stimulation  Antimalarials . Chloroquine . Hydroxychloroquine Intracellular inhibition of lysosomal enzymes  Urologic Agents . Alpha Blockers o Doxazosin o Prazosin o Tamsulosin o Terazosin  Increased renin and aldosterone  Adapted from Page RL, et al. "Drugs That May Cause or Exacerbate Heart Failure: A Scientific Statement from the [employer]." Circulation 2016; [id number]. DOI: 10.1161/CIR.0000000000000426   MEDICATION ADHERENCES TIPS AND STRATEGIES 1. Taking medication as prescribed improves patient outcomes in heart failure (reduces hospitalizations, improves symptoms, increases survival) 2. Side effects of  medications can be managed by decreasing doses, switching agents, stopping drugs, or adding additional therapy. Please let someone in the [facility] know if you have having bothersome side effects so we can modify your regimen. Do not alter your medication regimen without talking to [place].  3. Medication reminders can help patients remember to take drugs on time. If you are missing or forgetting doses you can try linking behaviors, using pill boxes, or an electronic reminder like an alarm on your phone or an app. Some people can also get automated phone calls as medication reminders.

## 2019-08-24 ENCOUNTER — Telehealth: Payer: Self-pay | Admitting: Internal Medicine

## 2019-08-24 MED ORDER — STIOLTO RESPIMAT 2.5-2.5 MCG/ACT IN AERS: INHALATION_SPRAY | RESPIRATORY_TRACT | 2 refills | Status: DC

## 2019-08-24 NOTE — Telephone Encounter (Signed)
[employer] called for pt needing verification on directions forSTIOLTO RESPIMAT 2.5-2.5 MCG/ACT AERS

## 2019-08-24 NOTE — Telephone Encounter (Signed)
Resent rx to [facility] mail order pharmacy.

## 2019-08-24 NOTE — Telephone Encounter (Signed)
What is unclear about 2 puffs twice daily?:)

## 2019-09-06 DIAGNOSIS — J449 Chronic obstructive pulmonary disease, unspecified: Secondary | ICD-10-CM | POA: Diagnosis not present

## 2019-09-11 ENCOUNTER — Other Ambulatory Visit: Payer: Self-pay | Admitting: Internal Medicine

## 2019-09-11 DIAGNOSIS — J449 Chronic obstructive pulmonary disease, unspecified: Secondary | ICD-10-CM | POA: Diagnosis not present

## 2019-09-11 DIAGNOSIS — E034 Atrophy of thyroid (acquired): Secondary | ICD-10-CM

## 2019-09-14 ENCOUNTER — Ambulatory Visit: Payer: Medicare HMO | Admitting: Internal Medicine

## 2019-10-04 ENCOUNTER — Other Ambulatory Visit: Payer: Self-pay | Admitting: Internal Medicine

## 2019-10-06 ENCOUNTER — Telehealth (INDEPENDENT_AMBULATORY_CARE_PROVIDER_SITE_OTHER): Payer: Medicare HMO | Admitting: Family

## 2019-10-06 ENCOUNTER — Encounter: Payer: Self-pay | Admitting: Family

## 2019-10-06 ENCOUNTER — Other Ambulatory Visit: Payer: Self-pay | Admitting: *Deleted

## 2019-10-06 VITALS — BP 120/75 | HR 81 | Ht 62.0 in | Wt 252.1 lb

## 2019-10-06 DIAGNOSIS — I2721 Secondary pulmonary arterial hypertension: Secondary | ICD-10-CM

## 2019-10-06 DIAGNOSIS — J449 Chronic obstructive pulmonary disease, unspecified: Secondary | ICD-10-CM

## 2019-10-06 DIAGNOSIS — Z9889 Other specified postprocedural states: Secondary | ICD-10-CM

## 2019-10-06 DIAGNOSIS — I5032 Chronic diastolic (congestive) heart failure: Secondary | ICD-10-CM | POA: Diagnosis not present

## 2019-10-06 MED ORDER — FUROSEMIDE 20 MG PO TABS: ORAL_TABLET | ORAL | 1 refills | Status: DC

## 2019-10-06 MED ORDER — FUROSEMIDE 20 MG PO TABS: 20.0000 mg | ORAL_TABLET | Freq: Two times a day (BID) | ORAL | 0 refills | Status: DC

## 2019-10-06 NOTE — Patient Instructions (Addendum)
Medication Instructions:  No medication changes today.   Continue Lasix 20mg  once daily.   You may take an extra tablet as needed for weight gain of 2 lbs overnight or 5 lbs in one week.   *If you need a refill on your cardiac medications before your next appointment, please call your pharmacy*   Lab Work: None ordered today.   Testing/Procedures: None ordered today.   Follow-Up: At [facility], you and your health needs are our priority.  As part of our continuing mission to provide you with exceptional heart care, we have created designated Provider Care Teams.  These Care Teams include your primary Cardiologist (physician) and Advanced Practice Providers (APPs -  Physician Assistants and Nurse Practitioners) who all work together to provide you with the care you need, when you need it.  We recommend signing up for the patient portal called "MyChart".  Sign up information is provided on this After Visit Summary.  MyChart is used to connect with patients for Virtual Visits (Telemedicine).  Patients are able to view lab/test results, encounter notes, upcoming appointments, etc.  Non-urgent messages can be sent to your provider as well.   To learn more about what you can do with MyChart, go to [email or web address].    Your next appointment:   6 month(s)  The format for your next appointment:   In Person  Provider:   You may see Kathlyn Sacramento, MD or one of the following Advanced Practice Providers on your designated Care Team:    Murray Hodgkins, NP  Christell Faith, PA-C  Marrianne Mood, PA-C  Other Instructions   Continue your low salt diet.    Recommend you elevate your legs when sitting as this can help prevent swelling.

## 2019-10-06 NOTE — Telephone Encounter (Signed)
Requested Prescriptions   Signed Prescriptions Disp Refills  . furosemide (LASIX) 20 MG tablet 180 tablet 0    Sig: Take 1 tablet (20 mg total) by mouth 2 (two) times daily.    Authorizing Provider: Theora Gianotti    Ordering User: Britt Bottom

## 2019-10-06 NOTE — Progress Notes (Signed)
Virtual Visit via Telephone Note   This visit type was conducted due to national recommendations for restrictions regarding the COVID-19 Pandemic (e.g. social distancing) in an effort to limit this patient's exposure and mitigate transmission in our community.  Due to her co-morbid illnesses, this patient is at least at moderate risk for complications without adequate follow up.  This format is felt to be most appropriate for this patient at this time.  The patient did not have access to video technology/had technical difficulties with video requiring transitioning to audio format only (telephone).  All issues noted in this document were discussed and addressed.  No physical exam could be performed with this format.  Please refer to the patient's chart for her  consent to telehealth for [facility].   The patient was identified using 2 identifiers.  Date:  10/06/2019   ID:  Lauren Bartlett, DOB [birth year], MRN [id number]  Patient Location: Home Provider Location: Office  PCP:  [name], MD  Cardiologist:  Kathlyn Sacramento, MD  Electrophysiologist:  None   Evaluation Performed:  Follow-Up Visit  Chief Complaint:  Follow up of HFpEF  History of Present Illness:    Lauren Bartlett is a 64 y.o. female with mitral valve prolapse and severe MR s/p mitral valve repair (06/2016), HTN, HFpEF, COPD, pulmonary arterial hypertension, congenital solitary kidney, hypothyroidism.   She had mitral valve repair due to mitral valve prolapse and severe mitral regurgitation 06/2016.  Cardiac catheterization prior to surgery revealed normal coronary arteries.  Follow-up echo June 2018 with normal LV function, normally functioning MV, mild to moderate TR, PAH with PASP of 82mmHg. follow echocardiogram November 2020 with normal LV function and mild mitral stenosis.  RVSP 45.3 mmHg.  Multiple ED visits February 2021 due to dyspnea treated treated with Solu-Medrol.  Seen in clinic 06/02/2019 with increasing  dyspnea.  Her Lasix was increased to 40 mg daily for for one week due to fluid retention likely in the setting of steroids.  Admitted to [facility] 06/09/19 - 06/11/19 for COPD exacerbation with possible contribution from HFpEF. Treated with steroids, abx, IV lasix. She was discharged with supplemental O2.   Seen at [facility] HF clinic 07/04/19 and 08/18/19 with stable HFpEF and no medication changes.    Today she reports feeling well.  She reports no chest pain, pressure, tightness.  She reports no shortness of breath at rest.  Tells me her dyspnea on exertion is stable.  She is trying to be more active.    When asked about palpitations she tells me they occur very rarely and she only notices when she is sleeping and wakes up from a dream.  Tells me she might have been dreaming about something worries some and this wakes her up.  She does not have daytime palpitations.  Tells me her swelling in her ankles comes and goes.  Does endorse eating more sodium over the weekend but her weight has been stable. Typically gets low-sodium meals through meals on wheels. She does endorse that she could sit with her feet elevated more often than she does.  She reports only mild wheezing which she attributes to her COPD and tells me is at her baseline.  She has a dry cough that is longstanding but no sputum production or signs or symptoms of infection per her report.  She has not had to use her albuterol inhaler recently.  The patient does not have symptoms concerning for COVID-19 infection (fever, chills, cough, or new shortness  of breath).    Past Medical History:  Diagnosis Date  . Anxiety   . Bleeding from the nose   . Chronic diastolic congestive heart failure ([place])    a. 03/2016 Echo: EF 55-60%, Gr2 DD; b. 08/2016 Echo: EF 60-65%, nl diast fxn; c. 01/2019 Echo: EF 55-60%. Nl RV fxn.  . Congenital absence of one kidney   . COPD (chronic obstructive pulmonary disease) ([place])    a. 02/2017 PFT: mod/sev obstructive airway  dzs w/ significant bronchodilator response.  . Depression    treated at [place]  . Dyspnea   . Emphysema/COPD ([place])   . GERD (gastroesophageal reflux disease)   . History of cardiac cath    a. 05/2016 Cath: nl cors.  . History of sciatica   . Hypertension   . Hypothyroidism   . Mitral Valve Prolapse & Severe mitral regurgitation s/p MVR    a. 03/2016 Echo: severe MVP involving the posterior leaflet, sev MR;  b. 06/2016 min invasive MV repair w/ triangular resection of flail segment (P2) of posterior leaflet, gore-tex neochord placement x 4, Sorin Memo 3D rin Annulosplasty (66mm, catalog # [id number], ser # [id number]); c. 08/2016 Echo: MV area 2.06 cm^2 (pressure 1/2 time); d. 01/2019 Echo: Mild MS w/ mean grad 24mmHg.  . Motion sickness    cars  . Pneumonia   . PONV (postoperative nausea and vomiting)   . Post-op Afib    a. 06/2016 following MV repair-->short-course amio. Coumadin d/c'd 2/2 epistaxis.  . Pulmonary hypertension ([place])    a. 08/2016 Echo: PASP 37mmHg; b. 01/2019 Echo: RVSP 45.63mmHg.  . S/P patent foramen ovale closure    a. 06/2016 @ time of MV Repair.  . Sleep apnea    O2 at night and PRN   Past Surgical History:  Procedure Laterality Date  . ABDOMINAL HYSTERECTOMY    . BREAST BIOPSY Right 2011   [facility]< benign  . CATARACT EXTRACTION W/PHACO Left 11/14/2015   Procedure: CATARACT EXTRACTION PHACO AND INTRAOCULAR LENS PLACEMENT (IOC);  Surgeon: Leandrew Koyanagi, MD;  Location: [place];  Service: Ophthalmology;  Laterality: Left;  sleep apnea [place]  . CATARACT EXTRACTION W/PHACO Right 12/19/2015   Procedure: CATARACT EXTRACTION PHACO AND INTRAOCULAR LENS PLACEMENT (IOC);  Surgeon: Leandrew Koyanagi, MD;  Location: [place];  Service: Ophthalmology;  Laterality: Right;  ANXIETY GENEROUS IV SEDATION TORIC LEN  . COMBINED HYSTERECTOMY ABDOMINAL W/ A&P REPAIR / OOPHORECTOMY  1996   benign tumor  . INNER EAR SURGERY     bilateral  . MITRAL VALVE REPAIR  Right 07/17/2016   Procedure: MINIMALLY INVASIVE MITRAL VALVE REPAIR (MVR) USING 26 SORIN MEMO 3D ANNULOPLASTY RING;  Surgeon: Rexene Alberts, MD;  Location: [place];  Service: Open Heart Surgery;  Laterality: Right;  . PATENT FORAMEN OVALE(PFO) CLOSURE N/A 07/17/2016   Procedure: PATENT FORAMEN OVALE (PFO) CLOSURE;  Surgeon: Rexene Alberts, MD;  Location: [place];  Service: Open Heart Surgery;  Laterality: N/A;  . RIGHT/LEFT HEART CATH AND CORONARY ANGIOGRAPHY Bilateral 06/16/2016   Procedure: Right/Left Heart Cath and Coronary Angiography;  Surgeon: Wellington Hampshire, MD;  Location: [place] CV [facility];  Service: Cardiovascular;  Laterality: Bilateral;  . TEE WITHOUT CARDIOVERSION N/A 05/19/2016   Procedure: TRANSESOPHAGEAL ECHOCARDIOGRAM (TEE);  Surgeon: Wellington Hampshire, MD;  Location: [facility];  Service: Cardiovascular;  Laterality: N/A;  . TEE WITHOUT CARDIOVERSION N/A 06/09/2016   Procedure: TRANSESOPHAGEAL ECHOCARDIOGRAM (TEE);  Surgeon: Wellington Hampshire, MD;  Location: [facility];  Service:  Cardiovascular;  Laterality: N/A;  . TEE WITHOUT CARDIOVERSION N/A 06/16/2016   Procedure: TRANSESOPHAGEAL ECHOCARDIOGRAM (TEE);  Surgeon: Wellington Hampshire, MD;  Location: [facility];  Service: Cardiovascular;  Laterality: N/A;  . TEE WITHOUT CARDIOVERSION N/A 07/17/2016   Procedure: TRANSESOPHAGEAL ECHOCARDIOGRAM (TEE);  Surgeon: Rexene Alberts, MD;  Location: [place];  Service: Open Heart Surgery;  Laterality: N/A;  . TONSILLECTOMY       Current Meds  Medication Sig  . acetaminophen (TYLENOL) 500 MG tablet Take 500 mg by mouth 2 (two) times daily as needed for moderate pain or headache.  . albuterol (PROVENTIL) (2.5 MG/3ML) 0.083% nebulizer solution INHALE THE CONTENTS OF 1 VIAL VIA NEBULIZER EVERY 6 HOURS AS NEEDED FOR WHEEZING OR FOR SHORTNESS OF BREATH   . albuterol (VENTOLIN HFA) 108 (90 Base) MCG/ACT inhaler INHALE 2 PUFFS INTO THE LUNGS EVERY 6 (SIX) HOURS AS NEEDED FOR WHEEZING OR SHORTNESS OF BREATH.   (USE WITH SPACER)  . aspirin EC 81 MG EC tablet Take 1 tablet (81 mg total) by mouth daily.  . Calcium Carb-Cholecalciferol 600-800 MG-UNIT TABS Take 1 tablet by mouth in the morning and at bedtime.  . docusate sodium (COLACE) 100 MG capsule Take 100 mg by mouth daily as needed for mild constipation.  . ergocalciferol (DRISDOL) 1.25 MG (50000 UT) capsule Take 1 capsule (50,000 Units total) by mouth once a week.  . famotidine (PEPCID AC) 10 MG chewable tablet Chew 20 mg by mouth daily as needed for heartburn.   . ferrous sulfate 325 (65 FE) MG tablet Take 325 mg by mouth daily with breakfast.  . fluticasone (FLONASE) 50 MCG/ACT nasal spray USE 2 SPRAYS IN EACH NOSTRIL EVERY DAY  . fluticasone (FLOVENT HFA) 110 MCG/ACT inhaler Inhale 2 puffs into the lungs 2 (two) times daily.  [name] levothyroxine (SYNTHROID) 150 MCG tablet TAKE 1 TABLET EVERY DAY BEFORE BREAKFAST (INCREASED DOSE)  . loratadine (CLARITIN) 10 MG tablet Take 10 mg by mouth daily.  [name] losartan (COZAAR) 50 MG tablet TAKE 1 TABLET (50 MG TOTAL) BY MOUTH DAILY.  [name] OXYGEN Inhale 2 L into the lungs at bedtime as needed (at bedtime and as needed).  [name] oxymetazoline (AFRIN) 0.05 % nasal spray Place 1 spray into both nostrils 2 (two) times daily. As needed for nose bleed  . polyethylene glycol powder (GLYCOLAX/MIRALAX) powder Take 17 g by mouth 2 (two) times daily as needed. (Patient taking differently: Take 17 g by mouth daily as needed for mild constipation. )  . STIOLTO RESPIMAT 2.5-2.5 MCG/ACT AERS Inhale two puffs into lungs twice daily.  . traMADol (ULTRAM) 50 MG tablet Take 1-2 tablets (50-100 mg total) by mouth every 4 (four) hours as needed for moderate pain.  . vitamin B-12 (CYANOCOBALAMIN) 1000 MCG tablet Take 1 tablet (1,000 mcg total) by mouth daily.  . [DISCONTINUED] furosemide (LASIX) 20 MG tablet Take 1 tablet (20 mg total) by mouth 2 (two) times daily.     Allergies:   Codeine, Levaquin [levofloxacin], Penicillins, Zithromax  [azithromycin], and Prednisone   Social History   Tobacco Use  . Smoking status: Never Smoker  . Smokeless tobacco: Never Used  Vaping Use  . Vaping Use: Never used  Substance Use Topics  . Alcohol use: No  . Drug use: No     Family Hx: The patient's family history includes Breast cancer in her paternal aunt; Cancer in her brother; Coronary artery disease in her father; Heart disease in her brother and father; Hypertension in her father  and mother; Multiple sclerosis in her mother.  ROS:   Please see the history of present illness.    Review of Systems  Constitutional: Negative for chills, fever and malaise/fatigue.  Cardiovascular: Positive for dyspnea on exertion and leg swelling. Negative for chest pain, near-syncope, orthopnea, palpitations and syncope.  Respiratory: Positive for cough (nonproductive) and wheezing. Negative for shortness of breath.   Gastrointestinal: Negative for nausea and vomiting.  Neurological: Negative for dizziness, light-headedness and weakness.   All other systems reviewed and are negative.  Prior CV studies:   The following studies were reviewed today:  Echo 01/2019  1. Left ventricular ejection fraction, by visual estimation, is 55 to  60%. The left ventricle has normal function. There is mildly increased  left ventricular hypertrophy.   2. Global right ventricle has normal systolic function.The right  ventricular size is normal. No increase in right ventricular wall  thickness.   3. The mitral valve has been repaired/replaced. No evidence of mitral  valve regurgitation. Mild mitral stenosis with mean gradient of 8 mm Hg.   4. The tricuspid valve is normal in structure. Tricuspid valve  regurgitation is trivial.   5. The aortic valve is normal in structure. Aortic valve regurgitation is  not visualized. No evidence of aortic valve sclerosis or stenosis.   6. Moderately elevated pulmonary artery systolic pressure.   7. The inferior vena cava  is dilated in size with >50% respiratory  variability, suggesting right atrial pressure of 8 mmHg.   8. The tricuspid regurgitant velocity is 3.05 m/s, and with an assumed  right atrial pressure of 8 mmHg, the estimated right ventricular systolic  pressure is moderately elevated at 45.3 mmHg.   Labs/Other Tests and Data Reviewed:    EKG:  No ECG reviewed.  Recent Labs: 06/09/2019: B Natriuretic Peptide 65.0 06/11/2019: Hemoglobin 13.6; Platelets 167 06/20/2019: ALT 26; BUN 12; Creatinine, Ser 0.89; Potassium 4.5 slight hemolysis; Sodium 137 07/05/2019: Magnesium 1.9; TSH 1.21   Recent Lipid Panel Lab Results  Component Value Date/Time   CHOL 200 01/26/2019 02:58 PM   TRIG 111.0 01/26/2019 02:58 PM   HDL 57.40 01/26/2019 02:58 PM   CHOLHDL 3 01/26/2019 02:58 PM   LDLCALC 121 (H) 01/26/2019 02:58 PM   LDLDIRECT 101.0 07/30/2017 10:07 AM    Wt Readings from Last 3 Encounters:  10/06/19 252 lb 1.6 oz (114.4 kg)  08/18/19 252 lb 4 oz (114.4 kg)  07/04/19 257 lb 6 oz (116.7 kg)     Objective:    Vital Signs:  BP 120/75   Pulse 81   Ht 5\' 2"  (1.575 m)   Wt 252 lb 1.6 oz (114.4 kg)   BMI 46.11 kg/m    VITAL SIGNS:  reviewed  ASSESSMENT & PLAN:    1. HFpEF -Echo November 2020 with normal LV function, RVSP 45.3 mmHg. Reports weights are stable. Reports DOE improving. Stable LE edema, encouraged low sodium diet and elevating lower extremities when sitting. Continue ARB. Continue Lasix 20mg  daily with extra tablet as needed for weight gain of 2lbs overnight or 5lbs in 1 week.  2. Pulmonary arterial hypertension - Reports no worsening SOB/DOE. Continue Lasix at present dose.   3. COPD - Stable without signs of acute exacerbation. Continue present inhalers. Continue to follow with pulmonology.   4. HTN -s/p mitral valve repair -Echo November 2020 with normal LV function and mild mitral stenosis with a mean gradient of 8 mmHg.  Time:   Today, I have spent 15 minutes  with the  patient with telehealth technology discussing the above problems.     Medication Adjustments/Labs and Tests Ordered: Current medicines are reviewed at length with the patient today.  Concerns regarding medicines are outlined above.   Tests Ordered: No orders of the defined types were placed in this encounter.   Medication Changes: No orders of the defined types were placed in this encounter.   Follow Up: In November with [facility] HF clinic as previously scheduled.  In Person in 6 month(s) with [name].   Signed, Loel Dubonnet, NP  10/06/2019 11:02 AM    [place]

## 2019-10-06 NOTE — Telephone Encounter (Signed)
Scheduled today

## 2019-10-07 ENCOUNTER — Other Ambulatory Visit: Payer: Self-pay | Admitting: Internal Medicine

## 2019-10-11 ENCOUNTER — Ambulatory Visit: Payer: Medicare HMO | Admitting: Pulmonary Disease

## 2019-10-11 DIAGNOSIS — J449 Chronic obstructive pulmonary disease, unspecified: Secondary | ICD-10-CM | POA: Diagnosis not present

## 2019-10-20 ENCOUNTER — Other Ambulatory Visit: Payer: Self-pay

## 2019-10-20 MED ORDER — ERGOCALCIFEROL 1.25 MG (50000 UT) PO CAPS: 50000.0000 [IU] | ORAL_CAPSULE | ORAL | 0 refills | Status: DC

## 2019-10-28 ENCOUNTER — Ambulatory Visit: Payer: Medicare HMO | Admitting: Internal Medicine

## 2019-11-02 ENCOUNTER — Ambulatory Visit (INDEPENDENT_AMBULATORY_CARE_PROVIDER_SITE_OTHER): Payer: Medicare HMO | Admitting: Internal Medicine

## 2019-11-02 ENCOUNTER — Encounter: Payer: Self-pay | Admitting: Internal Medicine

## 2019-11-02 ENCOUNTER — Other Ambulatory Visit: Payer: Self-pay

## 2019-11-02 DIAGNOSIS — E669 Obesity, unspecified: Secondary | ICD-10-CM

## 2019-11-02 DIAGNOSIS — E034 Atrophy of thyroid (acquired): Secondary | ICD-10-CM | POA: Diagnosis not present

## 2019-11-02 LAB — BASIC METABOLIC PANEL
BUN: 18 mg/dL (ref 6–23)
CO2: 31 mEq/L (ref 19–32)
Calcium: 8.2 mg/dL — ABNORMAL LOW (ref 8.4–10.5)
Chloride: 101 mEq/L (ref 96–112)
Creatinine, Ser: 0.87 mg/dL (ref 0.40–1.20)
GFR: 65.57 mL/min (ref >60.00)
Glucose, Bld: 82 mg/dL (ref 70–99)
Potassium: 4.1 mEq/L (ref 3.5–5.1)
Sodium: 139 mEq/L (ref 135–145)

## 2019-11-02 LAB — VITAMIN D 25 HYDROXY (VIT D DEFICIENCY, FRACTURES): VITD: 47.35 ng/mL (ref 30.00–100.00)

## 2019-11-02 LAB — MAGNESIUM: Magnesium: 1.9 mg/dL (ref 1.5–2.5)

## 2019-11-02 LAB — TSH: TSH: 0.58 u[IU]/mL (ref 0.35–4.50)

## 2019-11-02 NOTE — Patient Instructions (Signed)
Continue your calcium and vitamin D supplements as you are doing.   KEEP THE APPOINTMENT IN SEPTEMBER WITH DR SOLUM   EXCELLENT WORK ON THE WEIGHT LOSS!!

## 2019-11-02 NOTE — Progress Notes (Signed)
Subjective:  Patient ID: Lauren Bartlett, female    DOB: 11/09/1955  Age: 64 y.o. MRN: [id number]  CC: The primary encounter diagnosis was Hypocalcemia. Diagnoses of Hypothyroidism due to acquired atrophy of thyroid and Obesity (BMI 30-39.9) were also pertinent to this visit.  HPI Lauren Bartlett presents for 3 month follow up on obesity, PAF, COPD with pulmonary hypertension   This visit occurred during the SARS-CoV-2 public health emergency.  Safety protocols were in place, including screening questions prior to the visit, additional usage of staff PPE, and extensive cleaning of exam room while observing appropriate contact time as indicated for disinfecting solutions.     She has lost 13 lbs since March (last OV).  Feeling better  More energy,  sleeping better,  Averages 4-5 hours per night due to nocturia,  but does not nap during the day  Nocturia:  Stops liquids at 7 pm bedtime is  10 pm  Has 3 voids per night . Has urgency with small volume of urine.  Takes furosemide at 9 am   Hypocalcemia:  Endocrinology referral made in March, but she has not been seen due to transportation issues. She has appt In September with Dr Gabriel Carina Taking 1000 IUs of  Vitamin D and  1300 calcium  2000 mg salt reduction  Diet  Helping fluid retention and breathing     Outpatient Medications Prior to Visit  Medication Sig Dispense Refill  . acetaminophen (TYLENOL) 500 MG tablet Take 500 mg by mouth 2 (two) times daily as needed for moderate pain or headache.    [name] aspirin EC 81 MG EC tablet Take 1 tablet (81 mg total) by mouth daily.    . Calcium Carb-Cholecalciferol 600-800 MG-UNIT TABS Take 1 tablet by mouth in the morning and at bedtime.    . docusate sodium (COLACE) 100 MG capsule Take 100 mg by mouth daily as needed for mild constipation.    . ergocalciferol (DRISDOL) 1.25 MG (50000 UT) capsule Take 1 capsule (50,000 Units total) by mouth once a week. 12 capsule 0  . famotidine (PEPCID AC) 10 MG chewable  tablet Chew 20 mg by mouth daily as needed for heartburn.     . ferrous sulfate 325 (65 FE) MG tablet Take 325 mg by mouth daily with breakfast.    . FLOVENT HFA 110 MCG/ACT inhaler INHALE 2 PUFFS TWICE DAILY 36 g 1  . fluticasone (FLONASE) 50 MCG/ACT nasal spray USE 2 SPRAYS IN EACH NOSTRIL EVERY DAY 48 g 2  . furosemide (LASIX) 20 MG tablet 20mg  (one tablet) once daily. May take additional tablet as needed for weight gain of 2 lbs overnight or 5 lbs in one week. 135 tablet 1  . levothyroxine (SYNTHROID) 150 MCG tablet TAKE 1 TABLET EVERY DAY BEFORE BREAKFAST (INCREASED DOSE) 90 tablet 0  . loratadine (CLARITIN) 10 MG tablet Take 10 mg by mouth daily.    [name] losartan (COZAAR) 50 MG tablet TAKE 1 TABLET (50 MG TOTAL) BY MOUTH DAILY. 90 tablet 1  . OXYGEN Inhale 2 L into the lungs at bedtime as needed (at bedtime and as needed).    [name] oxymetazoline (AFRIN) 0.05 % nasal spray Place 1 spray into both nostrils 2 (two) times daily. As needed for nose bleed    . polyethylene glycol powder (GLYCOLAX/MIRALAX) powder Take 17 g by mouth 2 (two) times daily as needed. (Patient taking differently: Take 17 g by mouth daily as needed for mild constipation. ) 3350 g 1  .  STIOLTO RESPIMAT 2.5-2.5 MCG/ACT AERS Inhale two puffs into lungs twice daily. 4 g 2  . traMADol (ULTRAM) 50 MG tablet Take 1-2 tablets (50-100 mg total) by mouth every 4 (four) hours as needed for moderate pain. 30 tablet 0  . vitamin B-12 (CYANOCOBALAMIN) 1000 MCG tablet Take 1 tablet (1,000 mcg total) by mouth daily. 90 tablet 3  . albuterol (PROVENTIL) (2.5 MG/3ML) 0.083% nebulizer solution INHALE THE CONTENTS OF 1 VIAL VIA NEBULIZER EVERY 6 HOURS AS NEEDED FOR WHEEZING OR FOR SHORTNESS OF BREATH  90 mL 1  . albuterol (VENTOLIN HFA) 108 (90 Base) MCG/ACT inhaler INHALE 2 PUFFS INTO THE LUNGS EVERY 6 (SIX) HOURS AS NEEDED FOR WHEEZING OR SHORTNESS OF BREATH.  (USE WITH SPACER) 18 g 1   No facility-administered medications prior to visit.     Review of Systems;  Patient denies headache, fevers, malaise, unintentional weight loss, skin rash, eye pain, sinus congestion and sinus pain, sore throat, dysphagia,  hemoptysis , cough, dyspnea, wheezing, chest pain, palpitations, orthopnea, edema, abdominal pain, nausea, melena, diarrhea, constipation, flank pain, dysuria, hematuria, urinary  Frequency, nocturia, numbness, tingling, seizures,  Focal weakness, Loss of consciousness,  Tremor, insomnia, depression, anxiety, and suicidal ideation.      Objective:  BP 136/70 (BP Location: Left Arm, Patient Position: Sitting, Cuff Size: Large)   Pulse 77   Temp 98.2 F (36.8 C) (Oral)   Resp 17   Ht 5\' 2"  (1.575 m)   Wt 248 lb (112.5 kg)   SpO2 96%   BMI 45.36 kg/m   BP Readings from Last 3 Encounters:  11/02/19 136/70  10/06/19 120/75  08/18/19 (!) 141/88    Wt Readings from Last 3 Encounters:  11/02/19 248 lb (112.5 kg)  10/06/19 252 lb 1.6 oz (114.4 kg)  08/18/19 252 lb 4 oz (114.4 kg)    General appearance: alert, cooperative and appears stated age Ears: normal TM's and external ear canals both ears Throat: lips, mucosa, and tongue normal; teeth and gums normal Neck: no adenopathy, no carotid bruit, supple, symmetrical, trachea midline and thyroid not enlarged, symmetric, no tenderness/mass/nodules Back: symmetric, no curvature. ROM normal. No CVA tenderness. Lungs: clear to auscultation bilaterally Heart: regular rate and rhythm, S1, S2 normal, no murmur, click, rub or gallop Abdomen: soft, non-tender; bowel sounds normal; no masses,  no organomegaly Pulses: 2+ and symmetric Skin: Skin color, texture, turgor normal. No rashes or lesions Lymph nodes: Cervical, supraclavicular, and axillary nodes normal.  Lab Results  Component Value Date   HGBA1C 5.5 01/26/2019   HGBA1C 5.3 07/30/2017   HGBA1C 5.1 07/14/2016    Lab Results  Component Value Date   CREATININE 0.87 11/02/2019   CREATININE 0.89 06/20/2019    CREATININE 0.89 06/11/2019    Lab Results  Component Value Date   WBC 9.4 06/11/2019   HGB 13.6 06/11/2019   HCT 42.6 06/11/2019   PLT 167 06/11/2019   GLUCOSE 82 11/02/2019   CHOL 200 01/26/2019   TRIG 111.0 01/26/2019   HDL 57.40 01/26/2019   LDLDIRECT 101.0 07/30/2017   LDLCALC 121 (H) 01/26/2019   ALT 26 06/20/2019   AST 28 06/20/2019   NA 139 11/02/2019   K 4.1 11/02/2019   CL 101 11/02/2019   CREATININE 0.87 11/02/2019   BUN 18 11/02/2019   CO2 31 11/02/2019   TSH 0.58 11/02/2019   INR 1.4 08/26/2016   HGBA1C 5.5 01/26/2019    X-ray chest PA and lateral  Result Date: 06/10/2019 CLINICAL DATA:  Shortness of breath EXAM: CHEST - 2 VIEW COMPARISON:  Yesterday FINDINGS: Normal heart size and mediastinal contours. Mitral valve replacement. Chronic elevation of the right diaphragm. There is no edema, consolidation, effusion, or pneumothorax. IMPRESSION: Improved aeration at the right base.  No evidence of acute disease. Electronically Signed   By: Monte Fantasia M.D.   On: 06/10/2019 07:52   US Venous Img Lower Bilateral (DVT)  Result Date: 06/10/2019 CLINICAL DATA:  shortness of breath and edema in the lower extremity EXAM: Bilateral LOWER EXTREMITY VENOUS DOPPLER ULTRASOUND TECHNIQUE: Gray-scale sonography with compression, as well as color and duplex ultrasound, were performed to evaluate the deep venous system(s) from the level of the common femoral vein through the popliteal and proximal calf veins. COMPARISON:  None. FINDINGS: VENOUS Normal compressibility of the common femoral, superficial femoral, and popliteal veins, as well as the visualized calf veins. Visualized portions of profunda femoral vein and great saphenous vein unremarkable. No filling defects to suggest DVT on grayscale or color Doppler imaging. Doppler waveforms show normal direction of venous flow, normal respiratory phasicity and response to augmentation. Limited views of the contralateral common femoral  vein are unremarkable. OTHER None. Limitations: none IMPRESSION: No femoropopliteal DVT nor evidence of DVT within the visualized calf veins. Electronically Signed   By: Prudencio Pair M.D.   On: 06/10/2019 08:46   DG Chest Port 1 View  Result Date: 06/09/2019 CLINICAL DATA:  Shortness of breath. Wheezing. EXAM: PORTABLE CHEST 1 VIEW COMPARISON:  Radiograph 05/29/2019 FINDINGS: Chronic elevation of right hemidiaphragm with adjacent atelectasis or scarring. Mild cardiomegaly, stable from prior. Prosthetic cardiac valve. Unchanged mediastinal contours. Mild vascular congestion without overt pulmonary edema. Subsegmental atelectasis at the left lung base. No acute airspace disease. No large pleural effusion. No pneumothorax. No acute osseous abnormalities are seen. IMPRESSION: 1. Vascular congestion. Mild cardiomegaly stable. 2. Chronic elevation of right hemidiaphragm with adjacent atelectasis or scarring. Electronically Signed   By: Keith Rake M.D.   On: 06/09/2019 12:35    Assessment & Plan:   Problem List Items Addressed This Visit      Unprioritized   Hypocalcemia - Primary    Chronic , appears to be due to hypoparathyroidism.  Awaiting referral to Endocrinology .  Continue calcium supplementation and Vitamin D supplementation   Lab Results  Component Value Date   CALCIUM 8.2 (L) 11/02/2019   PHOS 4.2 06/11/2019        Relevant Orders   Basic metabolic panel (Completed)   Magnesium (Completed)   VITAMIN D 25 Hydroxy (Vit-D Deficiency, Fractures) (Completed)   PTH, intact (no Ca) (Completed)   Calcium, ionized (Completed)   Hypothyroidism (Chronic)     Thyroid function is WNL on current dose.  No current changes needed.  Lab Results  Component Value Date   TSH 0.58 11/02/2019         Relevant Orders   TSH (Completed)   Obesity (BMI 30-39.9) (Chronic)    I have congratulated her in reduction of   BMI and encouraged  Continued weight loss with goal of 10% of body weigh  over the next 6 months using a low glycemic index diet and regular exercise a minimum of 5 days per week.           I am having Jocelyn Lamer B. Franklyn maintain her polyethylene glycol powder, OXYGEN, famotidine, acetaminophen, aspirin, ferrous sulfate, vitamin B-12, traMADol, oxymetazoline, fluticasone, docusate sodium, losartan, loratadine, Calcium Carb-Cholecalciferol, Stiolto Respimat, levothyroxine, furosemide, Flovent HFA, and ergocalciferol.  No  orders of the defined types were placed in this encounter.   There are no discontinued medications.  Follow-up: Return in about 3 months (around 02/02/2020).   [name], MD

## 2019-11-03 ENCOUNTER — Other Ambulatory Visit: Payer: Self-pay | Admitting: Internal Medicine

## 2019-11-03 LAB — CALCIUM, IONIZED: Calcium, Ion: 4.36 mg/dL — ABNORMAL LOW (ref 4.8–5.6)

## 2019-11-03 LAB — PARATHYROID HORMONE, INTACT (NO CA): PTH: 28 pg/mL (ref 14–64)

## 2019-11-03 NOTE — Assessment & Plan Note (Signed)
I have congratulated her in reduction of   BMI and encouraged  Continued weight loss with goal of 10% of body weigh over the next 6 months using a low glycemic index diet and regular exercise a minimum of 5 days per week.    

## 2019-11-03 NOTE — Assessment & Plan Note (Signed)
°  Thyroid function is WNL on current dose.  No current changes needed.  Lab Results  Component Value Date   TSH 0.58 11/02/2019

## 2019-11-03 NOTE — Assessment & Plan Note (Signed)
Chronic , appears to be due to hypoparathyroidism.  Awaiting referral to Endocrinology .  Continue calcium supplementation and Vitamin D supplementation   Lab Results  Component Value Date   CALCIUM 8.2 (L) 11/02/2019   PHOS 4.2 06/11/2019

## 2019-11-06 DIAGNOSIS — J449 Chronic obstructive pulmonary disease, unspecified: Secondary | ICD-10-CM | POA: Diagnosis not present

## 2019-11-11 DIAGNOSIS — J449 Chronic obstructive pulmonary disease, unspecified: Secondary | ICD-10-CM | POA: Diagnosis not present

## 2019-11-17 ENCOUNTER — Other Ambulatory Visit: Payer: Self-pay | Admitting: Internal Medicine

## 2019-11-17 DIAGNOSIS — Z1231 Encounter for screening mammogram for malignant neoplasm of breast: Secondary | ICD-10-CM

## 2019-11-19 ENCOUNTER — Other Ambulatory Visit: Payer: Self-pay | Admitting: Internal Medicine

## 2019-11-19 DIAGNOSIS — E034 Atrophy of thyroid (acquired): Secondary | ICD-10-CM

## 2019-11-26 ENCOUNTER — Ambulatory Visit
Admission: EM | Admit: 2019-11-26 | Discharge: 2019-11-26 | Disposition: A | Discharge status: Home / Self Care | Payer: Medicare HMO | Attending: Internal Medicine | Admitting: Internal Medicine

## 2019-11-26 ENCOUNTER — Other Ambulatory Visit: Payer: Self-pay

## 2019-11-26 DIAGNOSIS — Z7982 Long term (current) use of aspirin: Secondary | ICD-10-CM | POA: Diagnosis not present

## 2019-11-26 DIAGNOSIS — J449 Chronic obstructive pulmonary disease, unspecified: Secondary | ICD-10-CM | POA: Insufficient documentation

## 2019-11-26 DIAGNOSIS — Z90721 Acquired absence of ovaries, unilateral: Secondary | ICD-10-CM | POA: Diagnosis not present

## 2019-11-26 DIAGNOSIS — Z90710 Acquired absence of both cervix and uterus: Secondary | ICD-10-CM | POA: Diagnosis not present

## 2019-11-26 DIAGNOSIS — Z952 Presence of prosthetic heart valve: Secondary | ICD-10-CM | POA: Insufficient documentation

## 2019-11-26 DIAGNOSIS — I110 Hypertensive heart disease with heart failure: Secondary | ICD-10-CM | POA: Insufficient documentation

## 2019-11-26 DIAGNOSIS — Z79890 Hormone replacement therapy (postmenopausal): Secondary | ICD-10-CM | POA: Diagnosis not present

## 2019-11-26 DIAGNOSIS — Z880 Allergy status to penicillin: Secondary | ICD-10-CM | POA: Insufficient documentation

## 2019-11-26 DIAGNOSIS — E559 Vitamin D deficiency, unspecified: Secondary | ICD-10-CM | POA: Diagnosis not present

## 2019-11-26 DIAGNOSIS — E039 Hypothyroidism, unspecified: Secondary | ICD-10-CM | POA: Diagnosis not present

## 2019-11-26 DIAGNOSIS — J069 Acute upper respiratory infection, unspecified: Secondary | ICD-10-CM | POA: Diagnosis not present

## 2019-11-26 DIAGNOSIS — E669 Obesity, unspecified: Secondary | ICD-10-CM | POA: Insufficient documentation

## 2019-11-26 DIAGNOSIS — I272 Pulmonary hypertension, unspecified: Secondary | ICD-10-CM | POA: Diagnosis not present

## 2019-11-26 DIAGNOSIS — Z8679 Personal history of other diseases of the circulatory system: Secondary | ICD-10-CM | POA: Insufficient documentation

## 2019-11-26 DIAGNOSIS — Z885 Allergy status to narcotic agent status: Secondary | ICD-10-CM | POA: Insufficient documentation

## 2019-11-26 DIAGNOSIS — Z881 Allergy status to other antibiotic agents status: Secondary | ICD-10-CM | POA: Insufficient documentation

## 2019-11-26 DIAGNOSIS — Z7951 Long term (current) use of inhaled steroids: Secondary | ICD-10-CM | POA: Diagnosis not present

## 2019-11-26 DIAGNOSIS — Z20822 Contact with and (suspected) exposure to covid-19: Secondary | ICD-10-CM | POA: Insufficient documentation

## 2019-11-26 DIAGNOSIS — I5032 Chronic diastolic (congestive) heart failure: Secondary | ICD-10-CM | POA: Insufficient documentation

## 2019-11-26 DIAGNOSIS — Z9861 Coronary angioplasty status: Secondary | ICD-10-CM | POA: Diagnosis not present

## 2019-11-26 DIAGNOSIS — Z79899 Other long term (current) drug therapy: Secondary | ICD-10-CM | POA: Diagnosis not present

## 2019-11-26 DIAGNOSIS — K219 Gastro-esophageal reflux disease without esophagitis: Secondary | ICD-10-CM | POA: Insufficient documentation

## 2019-11-26 DIAGNOSIS — Z888 Allergy status to other drugs, medicaments and biological substances status: Secondary | ICD-10-CM | POA: Insufficient documentation

## 2019-11-26 DIAGNOSIS — I480 Paroxysmal atrial fibrillation: Secondary | ICD-10-CM | POA: Diagnosis not present

## 2019-11-26 DIAGNOSIS — Z8774 Personal history of (corrected) congenital malformations of heart and circulatory system: Secondary | ICD-10-CM | POA: Insufficient documentation

## 2019-11-26 DIAGNOSIS — Z8249 Family history of ischemic heart disease and other diseases of the circulatory system: Secondary | ICD-10-CM | POA: Insufficient documentation

## 2019-11-26 NOTE — Discharge Instructions (Signed)
Please use your Flonase and saline nasal drops consistently Use humidifier at bedtime No indication for antibiotics If symptoms worsen please return to the urgent care to be reevaluated

## 2019-11-26 NOTE — ED Triage Notes (Signed)
Patient c/o cough, congestion and sinus congestion that started 2 days ago.  Patient denies fevers.

## 2019-11-27 LAB — SARS CORONAVIRUS 2 (TAT 6-24 HRS): SARS Coronavirus 2: NEGATIVE

## 2019-11-28 NOTE — ED Provider Notes (Signed)
[facility]    CSN: [id number] Arrival date & time: 11/26/19  1202      History   Chief Complaint Chief Complaint  Patient presents with  . Sinus Problem  . Cough    HPI Lauren Bartlett is a 64 y.o. female comes to the urgent care with complains of cough, nasal congestion and sinus pressure of 2 days duration.  Patient denies any shortness of breath, wheezing or chest pain.  She denies any sick contacts.  She has been vaccinated against COVID-19 virus.  Patient denies any orthopnea, paroxysmal nocturnal dyspnea or lower extremity swelling.  No wheezing.  HPI  Past Medical History:  Diagnosis Date  . Anxiety   . Bleeding from the nose   . Chronic diastolic congestive heart failure ([place])    a. 03/2016 Echo: EF 55-60%, Gr2 DD; b. 08/2016 Echo: EF 60-65%, nl diast fxn; c. 01/2019 Echo: EF 55-60%. Nl RV fxn.  . Congenital absence of one kidney   . COPD (chronic obstructive pulmonary disease) ([place])    a. 02/2017 PFT: mod/sev obstructive airway dzs w/ significant bronchodilator response.  . Depression    treated at [facility]  . Dyspnea   . Emphysema/COPD ([place])   . GERD (gastroesophageal reflux disease)   . History of cardiac cath    a. 05/2016 Cath: nl cors.  . History of sciatica   . Hypertension   . Hypothyroidism   . Mitral Valve Prolapse & Severe mitral regurgitation s/p MVR    a. 03/2016 Echo: severe MVP involving the posterior leaflet, sev MR;  b. 06/2016 min invasive MV repair w/ triangular resection of flail segment (P2) of posterior leaflet, gore-tex neochord placement x 4, Sorin Memo 3D rin Annulosplasty (77mm, catalog # [id number], ser # [id number]); c. 08/2016 Echo: MV area 2.06 cm^2 (pressure 1/2 time); d. 01/2019 Echo: Mild MS w/ mean grad 64mmHg.  . Motion sickness    cars  . Pneumonia   . PONV (postoperative nausea and vomiting)   . Post-op Afib    a. 06/2016 following MV repair-->short-course amio. Coumadin d/c'd 2/2 epistaxis.  . Pulmonary hypertension ([place])     a. 08/2016 Echo: PASP 95mmHg; b. 01/2019 Echo: RVSP 45.23mmHg.  . S/P patent foramen ovale closure    a. 06/2016 @ time of MV Repair.  . Sleep apnea    O2 at night and PRN    Patient Active Problem List   Diagnosis Date Noted  . COPD exacerbation ([place]) 06/10/2019  . PAF (paroxysmal atrial fibrillation) ([place]) 12/18/2018  . Nocturnal hypoxemia due to obstructive chronic bronchitis ([place]) 02/06/2018  . Grief reaction with prolonged bereavement 08/01/2017  . Encounter for therapeutic drug monitoring 08/13/2016  . S/P minimally invasive mitral valve repair 07/17/2016  . S/P patent foramen ovale closure 07/17/2016  . Chronic diastolic congestive heart failure ([place])   . COPD (chronic obstructive pulmonary disease) ([place]) 05/19/2016  . 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  . Hospital discharge follow-up 08/05/2015  . Vitamin D deficiency 01/11/2015  . Allergic rhinitis 07/09/2014  . S/P hysterectomy with oophorectomy 09/22/2013  . Pulmonary hypertension ([place])   . Hypocalcemia 09/10/2012  . Hypothyroidism 05/04/2012  . Obesity (BMI 30-39.9) 08/04/2011  . Hypertension   . Congenital absence of one kidney   . History of sciatica     Past Surgical History:  Procedure Laterality Date  . ABDOMINAL HYSTERECTOMY    . BREAST BIOPSY Right 2011   [facility]< benign  . CATARACT EXTRACTION W/PHACO Left 11/14/2015   Procedure: CATARACT  EXTRACTION PHACO AND INTRAOCULAR LENS PLACEMENT (IOC);  Surgeon: Leandrew Koyanagi, MD;  Location: [facility];  Service: Ophthalmology;  Laterality: Left;  sleep apnea [place]  . CATARACT EXTRACTION W/PHACO Right 12/19/2015   Procedure: CATARACT EXTRACTION PHACO AND INTRAOCULAR LENS PLACEMENT (IOC);  Surgeon: Leandrew Koyanagi, MD;  Location: [facility];  Service: Ophthalmology;  Laterality: Right;  ANXIETY GENEROUS IV SEDATION TORIC LEN  . COMBINED HYSTERECTOMY ABDOMINAL W/ A&P REPAIR / OOPHORECTOMY  1996   benign tumor  . INNER EAR SURGERY     bilateral  .  MITRAL VALVE REPAIR Right 07/17/2016   Procedure: MINIMALLY INVASIVE MITRAL VALVE REPAIR (MVR) USING 26 SORIN MEMO 3D ANNULOPLASTY RING;  Surgeon: Rexene Alberts, MD;  Location: [place];  Service: Open Heart Surgery;  Laterality: Right;  . PATENT FORAMEN OVALE(PFO) CLOSURE N/A 07/17/2016   Procedure: PATENT FORAMEN OVALE (PFO) CLOSURE;  Surgeon: Rexene Alberts, MD;  Location: [place] [place];  Service: Open Heart Surgery;  Laterality: N/A;  . RIGHT/LEFT HEART CATH AND CORONARY ANGIOGRAPHY Bilateral 06/16/2016   Procedure: Right/Left Heart Cath and Coronary Angiography;  Surgeon: Wellington Hampshire, MD;  Location: [facility];  Service: Cardiovascular;  Laterality: Bilateral;  . TEE WITHOUT CARDIOVERSION N/A 05/19/2016   Procedure: TRANSESOPHAGEAL ECHOCARDIOGRAM (TEE);  Surgeon: Wellington Hampshire, MD;  Location: [facility];  Service: Cardiovascular;  Laterality: N/A;  . TEE WITHOUT CARDIOVERSION N/A 06/09/2016   Procedure: TRANSESOPHAGEAL ECHOCARDIOGRAM (TEE);  Surgeon: Wellington Hampshire, MD;  Location: [facility];  Service: Cardiovascular;  Laterality: N/A;  . TEE WITHOUT CARDIOVERSION N/A 06/16/2016   Procedure: TRANSESOPHAGEAL ECHOCARDIOGRAM (TEE);  Surgeon: Wellington Hampshire, MD;  Location: [facility];  Service: Cardiovascular;  Laterality: N/A;  . TEE WITHOUT CARDIOVERSION N/A 07/17/2016   Procedure: TRANSESOPHAGEAL ECHOCARDIOGRAM (TEE);  Surgeon: Rexene Alberts, MD;  Location: [place];  Service: Open Heart Surgery;  Laterality: N/A;  . TONSILLECTOMY      OB History   No obstetric history on file.      Home Medications    Prior to Admission medications   Medication Sig Start Date End Date Taking? Authorizing Provider  acetaminophen (TYLENOL) 500 MG tablet Take 500 mg by mouth 2 (two) times daily as needed for moderate pain or headache.    [provider]  albuterol (PROVENTIL) (2.5 MG/3ML) 0.083% nebulizer solution INHALE THE CONTENTS OF 1 VIAL VIA NEBULIZER EVERY 6 HOURS AS NEEDED FOR WHEEZING  OR FOR SHORTNESS OF BREATH 11/03/19   [name], MD  albuterol (VENTOLIN HFA) 108 (90 Base) MCG/ACT inhaler INHALE 2 PUFFS INTO THE LUNGS EVERY 6 HOURS AS NEEDED FOR WHEEZING OR SHORTNESS OF BREATH.  (USE WITH SPACER) 11/03/19   [name], MD  aspirin EC 81 MG EC tablet Take 1 tablet (81 mg total) by mouth daily. 07/23/16   Barrett, Lodema Hong, PA-C  Calcium Carb-Cholecalciferol 600-800 MG-UNIT TABS Take 1 tablet by mouth in the morning and at bedtime.    [provider]  docusate sodium (COLACE) 100 MG capsule Take 100 mg by mouth daily as needed for mild constipation.    [provider]  ergocalciferol (DRISDOL) 1.25 MG (50000 UT) capsule Take 1 capsule (50,000 Units total) by mouth once a week. 10/20/19   [name], MD  famotidine (PEPCID AC) 10 MG chewable tablet Chew 20 mg by mouth daily as needed for heartburn.     [provider]  ferrous sulfate 325 (65 FE) MG tablet Take 325 mg  by mouth daily with breakfast.    [provider]  FLOVENT HFA 110 MCG/ACT inhaler INHALE 2 PUFFS TWICE DAILY 10/10/19   [name], MD  fluticasone (FLONASE) 50 MCG/ACT nasal spray USE 2 SPRAYS IN EACH NOSTRIL EVERY DAY 01/10/19   [name], MD  furosemide (LASIX) 20 MG tablet 20mg  (one tablet) once daily. May take additional tablet as needed for weight gain of 2 lbs overnight or 5 lbs in one week. 10/06/19   Loel Dubonnet, NP  levothyroxine (SYNTHROID) 150 MCG tablet TAKE 1 TABLET EVERY DAY BEFORE BREAKFAST (INCREASED DOSE) 11/21/19   [name], MD  loratadine (CLARITIN) 10 MG tablet Take 10 mg by mouth daily.    [provider]  losartan (COZAAR) 50 MG tablet TAKE 1 TABLET (50 MG TOTAL) BY MOUTH DAILY. 08/16/19   [name], MD  OXYGEN Inhale 2 L into the lungs at bedtime as needed (at bedtime and as needed).    [provider]  oxymetazoline (AFRIN) 0.05 % nasal spray Place 1 spray into both nostrils 2 (two) times daily. As needed  for nose bleed    [provider]  polyethylene glycol powder (GLYCOLAX/MIRALAX) powder Take 17 g by mouth 2 (two) times daily as needed. Patient taking differently: Take 17 g by mouth daily as needed for mild constipation.  08/03/15   [name], MD  STIOLTO RESPIMAT 2.5-2.5 MCG/ACT AERS Inhale two puffs into lungs twice daily. 08/24/19   [name], MD  traMADol (ULTRAM) 50 MG tablet Take 1-2 tablets (50-100 mg total) by mouth every 4 (four) hours as needed for moderate pain. 08/21/16   [name], MD  vitamin B-12 (CYANOCOBALAMIN) 1000 MCG tablet Take 1 tablet (1,000 mcg total) by mouth daily. 08/14/16   [name], MD    Family History Family History  Problem Relation Age of Onset  . Multiple sclerosis Mother   . Hypertension Mother   . Coronary artery disease Father   . Heart disease Father   . Hypertension Father   . Heart disease Brother   . Cancer Brother   . Breast cancer Paternal Aunt     Social History Social History   Tobacco Use  . Smoking status: Never Smoker  . Smokeless tobacco: Never Used  Vaping Use  . Vaping Use: Never used  Substance Use Topics  . Alcohol use: No  . Drug use: No     Allergies   Codeine, Levaquin [levofloxacin], Penicillins, Zithromax [azithromycin], and Prednisone   Review of Systems Review of Systems  Constitutional: Negative for activity change, chills and fever.  HENT: Positive for congestion, postnasal drip, rhinorrhea and sinus pressure. Negative for ear discharge, ear pain and sinus pain.   Respiratory: Positive for wheezing. Negative for cough and shortness of breath.   Cardiovascular: Negative.   Gastrointestinal: Negative.   Genitourinary: Negative.   Neurological: Negative.      Physical Exam Triage Vital Signs ED Triage Vitals  Enc Vitals Group     BP 11/26/19 1234 (!) 143/86     Pulse Rate 11/26/19 1234 84     Resp 11/26/19 1234 14     Temp 11/26/19 1234 97.9 F (36.6 C)     Temp  Source 11/26/19 1234 Oral     SpO2 11/26/19 1234 97 %     Weight 11/26/19 1232 248 lb 0.3 oz (112.5 kg)     Height --      Head Circumference --  Peak Flow --      Pain Score 11/26/19 1232 0     Pain Loc --      Pain Edu? --      Excl. in [place]? --    No data found.  Updated Vital Signs BP (!) 143/86 (BP Location: Left Arm)   Pulse 84   Temp 97.9 F (36.6 C) (Oral)   Resp 14   Wt 112.5 kg   SpO2 97%   BMI 45.36 kg/m   Visual Acuity Right Eye Distance:   Left Eye Distance:   Bilateral Distance:    Right Eye Near:   Left Eye Near:    Bilateral Near:     Physical Exam Vitals and nursing note reviewed.  Constitutional:      General: She is not in acute distress.    Appearance: She is not ill-appearing.  HENT:     Right Ear: Tympanic membrane normal.     Left Ear: Tympanic membrane normal.  Cardiovascular:     Rate and Rhythm: Normal rate and regular rhythm.     Pulses: Normal pulses.     Heart sounds: Normal heart sounds. No murmur heard.  No friction rub.  Pulmonary:     Effort: Pulmonary effort is normal.     Breath sounds: Normal breath sounds.  Musculoskeletal:        General: Normal range of motion.  Skin:    General: Skin is warm.     Capillary Refill: Capillary refill takes less than 2 seconds.  Neurological:     Mental Status: She is alert.      UC Treatments / Results  Labs (all labs ordered are listed, but only abnormal results are displayed) Labs Reviewed  SARS CORONAVIRUS 2 (TAT 6-24 HRS)    EKG   Radiology No results found.  Procedures Procedures (including critical care time)  Medications Ordered in UC Medications - No data to display  Initial Impression / Assessment and Plan / UC Course  I have reviewed the triage vital signs and the nursing notes.  Pertinent labs & imaging results that were available during my care of the patient were reviewed by me and considered in my medical decision making (see chart for details).       1.  Viral URI with cough: COVID-19 PCR test has been sent Patient is advised to use a bronchodilator attends, patient is advised to quarantine until COVID-19 test results are available Return precautions given Patient is currently euvolemic and has no indication for diuresis. Final Clinical Impressions(s) / UC Diagnoses   Final diagnoses:  Viral URI with cough     Discharge Instructions     Please use your Flonase and saline nasal drops consistently Use humidifier at bedtime No indication for antibiotics If symptoms worsen please return to the urgent care to be reevaluated    ED Prescriptions    None     PDMP not reviewed this encounter.   Chase Picket, MD 11/28/19 1341

## 2019-11-29 ENCOUNTER — Ambulatory Visit: Payer: Medicare HMO | Admitting: Pulmonary Disease

## 2019-12-06 ENCOUNTER — Ambulatory Visit
Admission: RE | Admit: 2019-12-06 | Discharge: 2019-12-06 | Disposition: A | Discharge status: Home / Self Care | Payer: Medicare HMO | Source: Ambulatory Visit | Attending: Internal Medicine | Admitting: Internal Medicine

## 2019-12-06 ENCOUNTER — Other Ambulatory Visit: Payer: Self-pay

## 2019-12-06 DIAGNOSIS — Z1231 Encounter for screening mammogram for malignant neoplasm of breast: Secondary | ICD-10-CM | POA: Insufficient documentation

## 2019-12-07 DIAGNOSIS — J449 Chronic obstructive pulmonary disease, unspecified: Secondary | ICD-10-CM | POA: Diagnosis not present

## 2019-12-12 ENCOUNTER — Other Ambulatory Visit: Payer: Self-pay | Admitting: Internal Medicine

## 2019-12-12 DIAGNOSIS — J449 Chronic obstructive pulmonary disease, unspecified: Secondary | ICD-10-CM | POA: Diagnosis not present

## 2019-12-19 ENCOUNTER — Other Ambulatory Visit: Payer: Self-pay | Admitting: Internal Medicine

## 2019-12-20 ENCOUNTER — Telehealth: Payer: Self-pay | Admitting: Internal Medicine

## 2019-12-20 DIAGNOSIS — Z1211 Encounter for screening for malignant neoplasm of colon: Secondary | ICD-10-CM

## 2019-12-20 NOTE — Telephone Encounter (Signed)
Pt received a letter regarding getting the cologuard test done. She would like to get this done and wants to know what she needs to do to get it done?

## 2019-12-21 NOTE — Telephone Encounter (Signed)
Patient called and notified that cologuard was ordered.

## 2019-12-21 NOTE — Telephone Encounter (Signed)
Are you okay with  me ordering cologard for patient?

## 2020-01-06 DIAGNOSIS — J449 Chronic obstructive pulmonary disease, unspecified: Secondary | ICD-10-CM | POA: Diagnosis not present

## 2020-01-10 DIAGNOSIS — Z1211 Encounter for screening for malignant neoplasm of colon: Secondary | ICD-10-CM | POA: Diagnosis not present

## 2020-01-10 LAB — COLOGUARD: Cologuard: NEGATIVE

## 2020-01-17 ENCOUNTER — Other Ambulatory Visit: Payer: Self-pay | Admitting: Internal Medicine

## 2020-01-17 DIAGNOSIS — E034 Atrophy of thyroid (acquired): Secondary | ICD-10-CM

## 2020-01-18 ENCOUNTER — Other Ambulatory Visit: Payer: Self-pay

## 2020-01-18 DIAGNOSIS — J449 Chronic obstructive pulmonary disease, unspecified: Secondary | ICD-10-CM

## 2020-01-18 MED ORDER — FUROSEMIDE 20 MG PO TABS: ORAL_TABLET | ORAL | 0 refills | Status: DC

## 2020-01-21 LAB — EXTERNAL GENERIC LAB PROCEDURE: COLOGUARD: NEGATIVE

## 2020-01-21 LAB — COLOGUARD: COLOGUARD: NEGATIVE

## 2020-01-25 ENCOUNTER — Other Ambulatory Visit: Payer: Self-pay | Admitting: Internal Medicine

## 2020-01-26 ENCOUNTER — Telehealth: Payer: Self-pay | Admitting: Internal Medicine

## 2020-01-26 LAB — COLOGUARD: Cologuard: NEGATIVE

## 2020-01-26 NOTE — Telephone Encounter (Signed)
The results of patient's cologuard is negative.   We will repeat in 3 years  For colon CA screening. . It has been abstracted

## 2020-01-26 NOTE — Telephone Encounter (Signed)
Spoke with pt in regards to her cologuard results. Pt gave a verbal understanding.

## 2020-01-26 NOTE — Telephone Encounter (Signed)
Pt returned your call.  

## 2020-01-26 NOTE — Telephone Encounter (Signed)
LMTCB

## 2020-01-30 DIAGNOSIS — J449 Chronic obstructive pulmonary disease, unspecified: Secondary | ICD-10-CM | POA: Diagnosis not present

## 2020-02-02 ENCOUNTER — Encounter: Payer: Self-pay | Admitting: Internal Medicine

## 2020-02-02 ENCOUNTER — Ambulatory Visit (INDEPENDENT_AMBULATORY_CARE_PROVIDER_SITE_OTHER): Payer: Medicare HMO | Admitting: Internal Medicine

## 2020-02-02 ENCOUNTER — Other Ambulatory Visit: Payer: Self-pay

## 2020-02-02 VITALS — BP 120/74 | HR 75 | Temp 97.7°F | Resp 15 | Ht 62.0 in | Wt 247.6 lb

## 2020-02-02 DIAGNOSIS — E034 Atrophy of thyroid (acquired): Secondary | ICD-10-CM | POA: Diagnosis not present

## 2020-02-02 DIAGNOSIS — J42 Unspecified chronic bronchitis: Secondary | ICD-10-CM

## 2020-02-02 DIAGNOSIS — E669 Obesity, unspecified: Secondary | ICD-10-CM

## 2020-02-02 DIAGNOSIS — E559 Vitamin D deficiency, unspecified: Secondary | ICD-10-CM | POA: Diagnosis not present

## 2020-02-02 DIAGNOSIS — I5032 Chronic diastolic (congestive) heart failure: Secondary | ICD-10-CM

## 2020-02-02 DIAGNOSIS — R6889 Other general symptoms and signs: Secondary | ICD-10-CM | POA: Diagnosis not present

## 2020-02-02 DIAGNOSIS — I10 Essential (primary) hypertension: Secondary | ICD-10-CM

## 2020-02-02 DIAGNOSIS — Z23 Encounter for immunization: Secondary | ICD-10-CM

## 2020-02-02 NOTE — Patient Instructions (Signed)
Please return in February for repeat labs and office visit    Low-Sodium Eating Plan Sodium, which is an element that makes up salt, helps you maintain a healthy balance of fluids in your body. Too much sodium can increase your blood pressure and cause fluid and waste to be held in your body. Your health care provider or dietitian may recommend following this plan if you have high blood pressure (hypertension), kidney disease, liver disease, or heart failure. Eating less sodium can help lower your blood pressure, reduce swelling, and protect your heart, liver, and kidneys. What are tips for following this plan? General guidelines  Most people on this plan should limit their sodium intake to 1,500-2,000 mg (milligrams) of sodium each day. Reading food labels   The Nutrition Facts label lists the amount of sodium in one serving of the food. If you eat more than one serving, you must multiply the listed amount of sodium by the number of servings.  Choose foods with less than 140 mg of sodium per serving.  Avoid foods with 300 mg of sodium or more per serving. Shopping  Look for lower-sodium products, often labeled as "low-sodium" or "no salt added."  Always check the sodium content even if foods are labeled as "unsalted" or "no salt added".  Buy fresh foods. ? Avoid canned foods and premade or frozen meals. ? Avoid canned, cured, or processed meats  Buy breads that have less than 80 mg of sodium per slice. Cooking  Eat more home-cooked food and less restaurant, buffet, and fast food.  Avoid adding salt when cooking. Use salt-free seasonings or herbs instead of table salt or sea salt. Check with your health care provider or pharmacist before using salt substitutes.  Cook with plant-based oils, such as canola, sunflower, or olive oil. Meal planning  When eating at a restaurant, ask that your food be prepared with less salt or no salt, if possible.  Avoid foods that contain MSG  (monosodium glutamate). MSG is sometimes added to [place] food, bouillon, and some canned foods. What foods are recommended? The items listed may not be a complete list. Talk with your dietitian about what dietary choices are best for you. Grains Low-sodium cereals, including oats, puffed wheat and rice, and shredded wheat. Low-sodium crackers. Unsalted rice. Unsalted pasta. Low-sodium bread. Whole-grain breads and whole-grain pasta. Vegetables Fresh or frozen vegetables. "No salt added" canned vegetables. "No salt added" tomato sauce and paste. Low-sodium or reduced-sodium tomato and vegetable juice. Fruits Fresh, frozen, or canned fruit. Fruit juice. Meats and other protein foods Fresh or frozen (no salt added) meat, poultry, seafood, and fish. Low-sodium canned tuna and salmon. Unsalted nuts. Dried peas, beans, and lentils without added salt. Unsalted canned beans. Eggs. Unsalted nut butters. Dairy Milk. Soy milk. Cheese that is naturally low in sodium, such as ricotta cheese, fresh mozzarella, or Swiss cheese Low-sodium or reduced-sodium cheese. Cream cheese. Yogurt. Fats and oils Unsalted butter. Unsalted margarine with no trans fat. Vegetable oils such as canola or olive oils. Seasonings and other foods Fresh and dried herbs and spices. Salt-free seasonings. Low-sodium mustard and ketchup. Sodium-free salad dressing. Sodium-free light mayonnaise. Fresh or refrigerated horseradish. Lemon juice. Vinegar. Homemade, reduced-sodium, or low-sodium soups. Unsalted popcorn and pretzels. Low-salt or salt-free chips. What foods are not recommended? The items listed may not be a complete list. Talk with your dietitian about what dietary choices are best for you. Grains Instant hot cereals. Bread stuffing, pancake, and biscuit mixes. Croutons. Seasoned rice or  pasta mixes. Noodle soup cups. Boxed or frozen macaroni and cheese. Regular salted crackers. Self-rising flour. Vegetables Sauerkraut, pickled  vegetables, and relishes. Olives. [place] fries. Onion rings. Regular canned vegetables (not low-sodium or reduced-sodium). Regular canned tomato sauce and paste (not low-sodium or reduced-sodium). Regular tomato and vegetable juice (not low-sodium or reduced-sodium). Frozen vegetables in sauces. Meats and other protein foods Meat or fish that is salted, canned, smoked, spiced, or pickled. Bacon, ham, sausage, hotdogs, corned beef, chipped beef, packaged lunch meats, salt pork, jerky, pickled herring, anchovies, regular canned tuna, sardines, salted nuts. Dairy Processed cheese and cheese spreads. Cheese curds. Blue cheese. Feta cheese. String cheese. Regular cottage cheese. Buttermilk. Canned milk. Fats and oils Salted butter. Regular margarine. Ghee. Bacon fat. Seasonings and other foods Onion salt, garlic salt, seasoned salt, table salt, and sea salt. Canned and packaged gravies. Worcestershire sauce. Tartar sauce. Barbecue sauce. Teriyaki sauce. Soy sauce, including reduced-sodium. Steak sauce. Fish sauce. Oyster sauce. Cocktail sauce. Horseradish that you find on the shelf. Regular ketchup and mustard. Meat flavorings and tenderizers. Bouillon cubes. Hot sauce and Tabasco sauce. Premade or packaged marinades. Premade or packaged taco seasonings. Relishes. Regular salad dressings. Salsa. Potato and tortilla chips. Corn chips and puffs. Salted popcorn and pretzels. Canned or dried soups. Pizza. Frozen entrees and pot pies. Summary  Eating less sodium can help lower your blood pressure, reduce swelling, and protect your heart, liver, and kidneys.  Most people on this plan should limit their sodium intake to 1,500-2,000 mg (milligrams) of sodium each day.  Canned, boxed, and frozen foods are high in sodium. Restaurant foods, fast foods, and pizza are also very high in sodium. You also get sodium by adding salt to food.  Try to cook at home, eat more fresh fruits and vegetables, and eat less fast  food, canned, processed, or prepared foods. This information is not intended to replace advice given to you by your health care provider. Make sure you discuss any questions you have with your health care provider. Document Revised: 02/27/2017 Document Reviewed: 03/10/2016 Elsevier Patient Education  2020 [employer].

## 2020-02-02 NOTE — Progress Notes (Signed)
Subjective:  Patient ID: Lauren Bartlett, female    DOB: 1956-03-08  Age: 64 y.o. MRN: [id number]  CC: There were no encounter diagnoses.  HPI Lauren Bartlett presents for follow up on PAF, COPD  And hypothyroidism  This visit occurred during the SARS-CoV-2 public health emergency.  Safety protocols were in place, including screening questions prior to the visit, additional usage of staff PPE, and extensive cleaning of exam room while observing appropriate contact time as indicated for disinfecting solutions.    Patient has received both doses of the available COVID 19 vaccine without complications.  Patient continues to mask when outside of the home except when walking in yard or at safe distances from others .  Patient denies any change in mood or development of unhealthy behaviors resuting from the pandemic's restriction of activities and socialization.    Patient feels generally well , is walking daily for exercise, and trying to lose weight. No recent episodes of dyspnea  Patient is taking her medications as prescribed and notes no adverse effects.  Home BP readings have been done about once per week and are  generally < 130/80 .  She is avoiding added salt in her diet and walking regularly about 3 times per week for exercise  .     Outpatient Medications Prior to Visit  Medication Sig Dispense Refill  . acetaminophen (TYLENOL) 500 MG tablet Take 500 mg by mouth 2 (two) times daily as needed for moderate pain or headache.    . albuterol (PROVENTIL) (2.5 MG/3ML) 0.083% nebulizer solution INHALE THE CONTENTS OF 1 VIAL VIA NEBULIZER EVERY 6 HOURS AS NEEDED FOR WHEEZING OR FOR SHORTNESS OF BREATH 90 mL 1  . albuterol (VENTOLIN HFA) 108 (90 Base) MCG/ACT inhaler INHALE 2 PUFFS INTO THE LUNGS EVERY 6 HOURS AS NEEDED FOR WHEEZING OR SHORTNESS OF BREATH.  (USE WITH SPACER) 18 g 1  . aspirin EC 81 MG EC tablet Take 1 tablet (81 mg total) by mouth daily.    . Calcium Carb-Cholecalciferol 600-800  MG-UNIT TABS Take 1 tablet by mouth in the morning and at bedtime.    . docusate sodium (COLACE) 100 MG capsule Take 100 mg by mouth daily as needed for mild constipation.    . ergocalciferol (DRISDOL) 1.25 MG (50000 UT) capsule Take 1 capsule (50,000 Units total) by mouth once a week. 12 capsule 0  . famotidine (PEPCID AC) 10 MG chewable tablet Chew 20 mg by mouth daily as needed for heartburn.     . ferrous sulfate 325 (65 FE) MG tablet Take 325 mg by mouth daily with breakfast.    . FLOVENT HFA 110 MCG/ACT inhaler INHALE 2 PUFFS TWICE DAILY 36 g 1  . fluticasone (FLONASE) 50 MCG/ACT nasal spray USE 2 SPRAYS IN EACH NOSTRIL EVERY DAY 48 g 2  . furosemide (LASIX) 20 MG tablet 20mg  (one tablet) once daily. May take additional tablet as needed for weight gain of 2 lbs overnight or 5 lbs in one week. 90 tablet 0  . levothyroxine (SYNTHROID) 150 MCG tablet TAKE 1 TABLET EVERY DAY BEFORE BREAKFAST (INCREASED DOSE) 90 tablet 0  . loratadine (CLARITIN) 10 MG tablet Take 10 mg by mouth daily.    [name] losartan (COZAAR) 50 MG tablet TAKE 1 TABLET EVERY DAY 90 tablet 1  . OXYGEN Inhale 2 L into the lungs at bedtime as needed (at bedtime and as needed).    [name] oxymetazoline (AFRIN) 0.05 % nasal spray Place 1 spray into both  nostrils 2 (two) times daily. As needed for nose bleed    . polyethylene glycol powder (GLYCOLAX/MIRALAX) powder Take 17 g by mouth 2 (two) times daily as needed. (Patient taking differently: Take 17 g by mouth daily as needed for mild constipation. ) 3350 g 1  . STIOLTO RESPIMAT 2.5-2.5 MCG/ACT AERS Inhale two puffs into lungs twice daily. 4 g 2  . traMADol (ULTRAM) 50 MG tablet Take 1-2 tablets (50-100 mg total) by mouth every 4 (four) hours as needed for moderate pain. 30 tablet 0  . vitamin B-12 (CYANOCOBALAMIN) 1000 MCG tablet Take 1 tablet (1,000 mcg total) by mouth daily. 90 tablet 3   No facility-administered medications prior to visit.    Review of Systems;  Patient denies  headache, fevers, malaise, unintentional weight loss, skin rash, eye pain, sinus congestion and sinus pain, sore throat, dysphagia,  hemoptysis , cough, dyspnea, wheezing, chest pain, palpitations, orthopnea, edema, abdominal pain, nausea, melena, diarrhea, constipation, flank pain, dysuria, hematuria, urinary  Frequency, nocturia, numbness, tingling, seizures,  Focal weakness, Loss of consciousness,  Tremor, insomnia, depression, anxiety, and suicidal ideation.      Objective:  BP 120/74 (BP Location: Left Arm, Patient Position: Sitting, Cuff Size: Normal)   Pulse 75   Temp 97.7 F (36.5 C) (Oral)   Resp 15   Ht 5\' 2"  (1.575 m)   Wt 247 lb 9.6 oz (112.3 kg)   SpO2 99%   BMI 45.29 kg/m   BP Readings from Last 3 Encounters:  02/02/20 120/74  11/26/19 (!) 143/86  11/02/19 136/70    Wt Readings from Last 3 Encounters:  02/02/20 247 lb 9.6 oz (112.3 kg)  11/26/19 248 lb 0.3 oz (112.5 kg)  11/02/19 248 lb (112.5 kg)    General appearance: alert, cooperative and appears stated age Ears: normal TM's and external ear canals both ears Throat: lips, mucosa, and tongue normal; teeth and gums normal Neck: no adenopathy, no carotid bruit, supple, symmetrical, trachea midline and thyroid not enlarged, symmetric, no tenderness/mass/nodules Back: symmetric, no curvature. ROM normal. No CVA tenderness. Lungs: clear to auscultation bilaterally Heart: regular rate and rhythm, S1, S2 normal, no murmur, click, rub or gallop Abdomen: soft, non-tender; bowel sounds normal; no masses,  no organomegaly Pulses: 2+ and symmetric Skin: Skin color, texture, turgor normal. No rashes or lesions Lymph nodes: Cervical, supraclavicular, and axillary nodes normal.  Lab Results  Component Value Date   HGBA1C 5.5 01/26/2019   HGBA1C 5.3 07/30/2017   HGBA1C 5.1 07/14/2016    Lab Results  Component Value Date   CREATININE 0.87 11/02/2019   CREATININE 0.89 06/20/2019   CREATININE 0.89 06/11/2019    Lab  Results  Component Value Date   WBC 9.4 06/11/2019   HGB 13.6 06/11/2019   HCT 42.6 06/11/2019   PLT 167 06/11/2019   GLUCOSE 82 11/02/2019   CHOL 200 01/26/2019   TRIG 111.0 01/26/2019   HDL 57.40 01/26/2019   LDLDIRECT 101.0 07/30/2017   LDLCALC 121 (H) 01/26/2019   ALT 26 06/20/2019   AST 28 06/20/2019   NA 139 11/02/2019   K 4.1 11/02/2019   CL 101 11/02/2019   CREATININE 0.87 11/02/2019   BUN 18 11/02/2019   CO2 31 11/02/2019   TSH 0.58 11/02/2019   INR 1.4 08/26/2016   HGBA1C 5.5 01/26/2019    MM 3D SCREEN BREAST BILATERAL  Result Date: 12/06/2019 CLINICAL DATA:  Screening. EXAM: DIGITAL SCREENING BILATERAL MAMMOGRAM WITH TOMO AND CAD COMPARISON:  Previous exam(s). ACR Breast  Density Category c: The breast tissue is heterogeneously dense, which may obscure small masses. FINDINGS: There are no findings suspicious for malignancy. Images were processed with CAD. IMPRESSION: No mammographic evidence of malignancy. A result letter of this screening mammogram will be mailed directly to the patient. RECOMMENDATION: Screening mammogram in one year. (Code:SM-B-01Y) BI-RADS CATEGORY  1: Negative. Electronically Signed   By: Lovey Newcomer M.D.   On: 12/06/2019 13:35    Assessment & Plan:   Problem List Items Addressed This Visit    None      I am having Lauren Bartlett maintain her polyethylene glycol powder, OXYGEN, famotidine, acetaminophen, aspirin, ferrous sulfate, vitamin B-12, traMADol, oxymetazoline, fluticasone, docusate sodium, loratadine, Calcium Carb-Cholecalciferol, Stiolto Respimat, Flovent HFA, ergocalciferol, albuterol, losartan, levothyroxine, furosemide, and albuterol.  No orders of the defined types were placed in this encounter.   There are no discontinued medications.  Follow-up: No follow-ups on file.   [name], MD

## 2020-02-04 NOTE — Assessment & Plan Note (Addendum)
Managed with daily use of  lasix and losartan and prn extra doses for wt gain of 2 lb o overnight. .  Currently taking 20 mg lasix daily  Lab Results  Component Value Date   CREATININE 0.87 11/02/2019   Will continue  Daily use of lasix at 20 mg  dose

## 2020-02-04 NOTE — Assessment & Plan Note (Signed)
No Recent asthma exacerbations  Continue follow up with pulmonary

## 2020-02-04 NOTE — Assessment & Plan Note (Signed)
Well controlled on current regimen of losartan 50 mg and furosemide 0 mg daily . Renal function stable, no changes today.

## 2020-02-06 DIAGNOSIS — J449 Chronic obstructive pulmonary disease, unspecified: Secondary | ICD-10-CM | POA: Diagnosis not present

## 2020-02-10 ENCOUNTER — Other Ambulatory Visit: Payer: Self-pay | Admitting: Internal Medicine

## 2020-02-16 ENCOUNTER — Ambulatory Visit (INDEPENDENT_AMBULATORY_CARE_PROVIDER_SITE_OTHER): Payer: Medicare HMO

## 2020-02-16 ENCOUNTER — Telehealth: Payer: Self-pay | Admitting: Family

## 2020-02-16 VITALS — Ht 62.0 in | Wt 247.0 lb

## 2020-02-16 DIAGNOSIS — Z0000 Encounter for general adult medical examination without abnormal findings: Secondary | ICD-10-CM | POA: Diagnosis not present

## 2020-02-16 NOTE — Telephone Encounter (Signed)
Virtual Visit Pre-Appointment Phone Call  "Ms Warr, I am calling you today to discuss your upcoming appointment. We are currently trying to limit exposure to the virus that causes COVID-19 by seeing patients at home rather than in the office."  1. "What is the BEST phone number to call the day of the visit?" - include this in appointment notes  2. Do you have or have access to (through a family member/friend) a smartphone with video capability that we can use for your visit?" a. If yes - list this number in appt notes as cell (if different from BEST phone #) and list the appointment type as a VIDEO visit in appointment notes b. If no - list the appointment type as a PHONE visit in appointment notes  3. Confirm consent - "In the setting of the current Covid19 crisis, you are scheduled for a (phone or video) visit with your provider on (date) at (time).  Just as we do with many in-office visits, in order for you to participate in this visit, we must obtain consent.  If you'd like, I can send this to your mychart (if signed up) or email for you to review.  Otherwise, I can obtain your verbal consent now.  All virtual visits are billed to your insurance company just like a normal visit would be.  By agreeing to a virtual visit, we'd like you to understand that the technology does not allow for your provider to perform an examination, and thus may limit your provider's ability to fully assess your condition. If your provider identifies any concerns that need to be evaluated in person, we will make arrangements to do so.  Finally, though the technology is pretty good, we cannot assure that it will always work on either your or our end, and in the setting of a video visit, we may have to convert it to a phone-only visit.  In either situation, we cannot ensure that we have a secure connection.  Are you willing to proceed?" STAFF: Did the patient verbally acknowledge consent to telehealth visit? Document  YES/NO here: yes  4. Advise patient to be prepared - "Two hours prior to your appointment, go ahead and check your blood pressure, pulse, oxygen saturation, and your weight (if you have the equipment to check those) and write them all down. When your visit starts, your provider will ask you for this information. If you have an Apple Watch or Kardia device, please plan to have heart rate information ready on the day of your appointment. Please have a pen and paper handy nearby the day of the visit as well."  5. Give patient instructions for MyChart download to smartphone OR [email or web address] as below if video visit (depending on what platform provider is using)  6. Inform patient they will receive a phone call 15 minutes prior to their appointment time (may be from unknown caller ID) so they should be prepared to answer    TELEPHONE CALL NOTE  MEKHI LASCOLA has been deemed a candidate for a follow-up tele-health visit to limit community exposure during the Covid-19 pandemic. I spoke with the patient via phone to ensure availability of phone/video source, confirm preferred email & phone number, and discuss instructions and expectations.  I reminded PAMULA LUTHER to be prepared with any vital sign and/or heart rhythm information that could potentially be obtained via home monitoring, at the time of her visit. I reminded CLOTHILDE TIPPETTS to expect a phone call prior to  her visit.  Alisa Graff, [place] 02/16/2020 10:08 AM   INSTRUCTIONS FOR DOWNLOADING THE MYCHART APP TO SMARTPHONE  - The patient must first make sure to have activated MyChart and know their login information - If Apple, go to [employer] and type in MyChart in the search bar and download the app. If Android, ask patient to go to [employer] and type in [place] in the search bar and download the app. The app is free but as with any other app downloads, their phone may require them to verify saved payment information or Apple/Android  password.  - The patient will need to then log into the app with their MyChart username and password, and select [facility] as their healthcare provider to link the account. When it is time for your visit, go to the MyChart app, find appointments, and click Begin Video Visit. Be sure to Select Allow for your device to access the Microphone and Camera for your visit. You will then be connected, and your provider will be with you shortly.  **If they have any issues connecting, or need assistance please contact MyChart service desk [phone])**  **If using a computer, in order to ensure the best quality for their visit they will need to use either of the following Internet Browsers: [employer], or Google Chrome**  IF USING DOXIMITY or DOXY.ME - The patient will receive a link just prior to their visit by text.     FULL LENGTH CONSENT FOR TELE-HEALTH VISIT   I hereby voluntarily request, consent and authorize [facility] and its employed or contracted physicians, physician assistants, nurse practitioners or other licensed health care professionals (the Practitioner), to provide me with telemedicine health care services (the Services") as deemed necessary by the treating Practitioner. I acknowledge and consent to receive the Services by the Practitioner via telemedicine. I understand that the telemedicine visit will involve communicating with the Practitioner through live audiovisual communication technology and the disclosure of certain medical information by electronic transmission. I acknowledge that I have been given the opportunity to request an in-person assessment or other available alternative prior to the telemedicine visit and am voluntarily participating in the telemedicine visit.  I understand that I have the right to withhold or withdraw my consent to the use of telemedicine in the course of my care at any time, without affecting my right to future care or treatment, and  that the Practitioner or I may terminate the telemedicine visit at any time. I understand that I have the right to inspect all information obtained and/or recorded in the course of the telemedicine visit and may receive copies of available information for a reasonable fee.  I understand that some of the potential risks of receiving the Services via telemedicine include:   Delay or interruption in medical evaluation due to technological equipment failure or disruption;  Information transmitted may not be sufficient (e.g. poor resolution of images) to allow for appropriate medical decision making by the Practitioner; and/or   In rare instances, security protocols could fail, causing a breach of personal health information.  Furthermore, I acknowledge that it is my responsibility to provide information about my medical history, conditions and care that is complete and accurate to the best of my ability. I acknowledge that Practitioner's advice, recommendations, and/or decision may be based on factors not within their control, such as incomplete or inaccurate data provided by me or distortions of diagnostic images or specimens that may result from electronic transmissions.  I understand that the practice of medicine is not an exact science and that Practitioner makes no warranties or guarantees regarding treatment outcomes. I acknowledge that I will receive a copy of this consent concurrently upon execution via email to the email address I last provided but may also request a printed copy by calling the office of [facility].    I understand that my insurance will be billed for this visit.   I have read or had this consent read to me.  I understand the contents of this consent, which adequately explains the benefits and risks of the Services being provided via telemedicine.   I have been provided ample opportunity to ask questions regarding this consent and the Services and have had my  questions answered to my satisfaction.  I give my informed consent for the services to be provided through the use of telemedicine in my medical care  By participating in this telemedicine visit I agree to the above.

## 2020-02-16 NOTE — Patient Instructions (Addendum)
Lauren Bartlett , Thank you for taking time to come for your Medicare Wellness Visit. I appreciate your ongoing commitment to your health goals. Please review the following plan we discussed and let me know if I can assist you in the future.   These are the goals we discussed: Goals      Patient Stated   .  Increase physical activity (pt-stated)      Walk more for exercise. Lose weight.        This is a list of the screening recommended for you and due dates:  Health Maintenance  Topic Date Due  . Tetanus Vaccine  11/13/2019  . Mammogram  12/05/2021  . Cologuard (Stool DNA test)  01/26/2023  . Flu Shot  Completed  . COVID-19 Vaccine  Completed  .  Hepatitis C: One time screening is recommended by Center for Disease Control  (CDC) for  adults born from [birth year] through 1965.   Completed  . HIV Screening  Completed  . Pap Smear  Discontinued    Immunizations Immunization History  Administered Date(s) Administered  . Influenza Split 01/02/2012  . Influenza,inj,Quad PF,6+ Mos 03/13/2014, 01/08/2015, 02/03/2018, 01/26/2019, 02/02/2020  . Influenza-Unspecified 01/11/2013, 12/30/2014  . PFIZER SARS-COV-2 Vaccination 08/12/2019, 09/07/2019  . Pneumococcal Conjugate-13 02/27/2015  . Pneumococcal Polysaccharide-23 03/07/2013, 04/13/2019  . Tdap 11/12/2009   Keep all routine maintenance appointments.   Follow up 05/04/20 @ 1:00  Advanced directives: not yet completd  Conditions/risks identified: none new  Follow up in one year for your annual wellness visit.  Preventive Care 40-64 Years, Female Preventive care refers to lifestyle choices and visits with your health care provider that can promote health and wellness. What does preventive care include?  A yearly physical exam. This is also called an annual well check.  Dental exams once or twice a year.  Routine eye exams. Ask your health care provider how often you should have your eyes checked.  Personal lifestyle choices,  including:  Daily care of your teeth and gums.  Regular physical activity.  Eating a healthy diet.  Avoiding tobacco and drug use.  Limiting alcohol use.  Practicing safe sex.  Taking low-dose aspirin every day starting at age 38. What happens during an annual well check? The services and screenings done by your health care provider during your annual well check will depend on your age, overall health, lifestyle risk factors, and family history of disease. Counseling  Your health care provider may ask you questions about your:  Alcohol use.  Tobacco use.  Drug use.  Emotional well-being.  Home and relationship well-being.  Sexual activity.  Eating habits.  Work and work Statistician. Screening  You may have the following tests or measurements:  Height, weight, and BMI.  Blood pressure.  Lipid and cholesterol levels. These may be checked every 5 years, or more frequently if you are over 59 years old.  Skin check.  Lung cancer screening. You may have this screening every year starting at age 58 if you have a 30-pack-year history of smoking and currently smoke or have quit within the past 15 years.  Fecal occult blood test (FOBT) of the stool. You may have this test every year starting at age 62.  Flexible sigmoidoscopy or colonoscopy. You may have a sigmoidoscopy every 5 years or a colonoscopy every 10 years starting at age 68.  Prostate cancer screening. Recommendations will vary depending on your family history and other risks.  Hepatitis C blood test.  Hepatitis B blood  test.  Sexually transmitted disease (STD) testing.  Diabetes screening. This is done by checking your blood sugar (glucose) after you have not eaten for a while (fasting). You may have this done every 1-3 years. Discuss your test results, treatment options, and if necessary, the need for more tests with your health care provider. Vaccines  Your health care provider may recommend certain  vaccines, such as:  Influenza vaccine. This is recommended every year.  Tetanus, diphtheria, and acellular pertussis (Tdap, Td) vaccine. You may need a Td booster every 10 years.  Zoster vaccine. You may need this after age 72.  Pneumococcal 13-valent conjugate (PCV13) vaccine. You may need this if you have certain conditions and have not been vaccinated.  Pneumococcal polysaccharide (PPSV23) vaccine. You may need one or two doses if you smoke cigarettes or if you have certain conditions. Talk to your health care provider about which screenings and vaccines you need and how often you need them. This information is not intended to replace advice given to you by your health care provider. Make sure you discuss any questions you have with your health care provider. Document Released: 04/13/2015 Document Revised: 12/05/2015 Document Reviewed: 01/16/2015 Elsevier Interactive Patient Education  2017 [place] Prevention in the Home Falls can cause injuries. They can happen to people of all ages. There are many things you can do to make your home safe and to help prevent falls. What can I do on the outside of my home?  Regularly fix the edges of walkways and driveways and fix any cracks.  Remove anything that might make you trip as you walk through a door, such as a raised step or threshold.  Trim any bushes or trees on the path to your home.  Use bright outdoor lighting.  Clear any walking paths of anything that might make someone trip, such as rocks or tools.  Regularly check to see if handrails are loose or broken. Make sure that both sides of any steps have handrails.  Any raised decks and porches should have guardrails on the edges.  Have any leaves, snow, or ice cleared regularly.  Use sand or salt on walking paths during winter.  Clean up any spills in your garage right away. This includes oil or grease spills. What can I do in the bathroom?  Use night  lights.  Install grab bars by the toilet and in the tub and shower. Do not use towel bars as grab bars.  Use non-skid mats or decals in the tub or shower.  If you need to sit down in the shower, use a plastic, non-slip stool.  Keep the floor dry. Clean up any water that spills on the floor as soon as it happens.  Remove soap buildup in the tub or shower regularly.  Attach bath mats securely with double-sided non-slip rug tape.  Do not have throw rugs and other things on the floor that can make you trip. What can I do in the bedroom?  Use night lights.  Make sure that you have a light by your bed that is easy to reach.  Do not use any sheets or blankets that are too big for your bed. They should not hang down onto the floor.  Have a firm chair that has side arms. You can use this for support while you get dressed.  Do not have throw rugs and other things on the floor that can make you trip. What can I do in the kitchen?  Clean up any spills right away.  Avoid walking on wet floors.  Keep items that you use a lot in easy-to-reach places.  If you need to reach something above you, use a strong step stool that has a grab bar.  Keep electrical cords out of the way.  Do not use floor polish or wax that makes floors slippery. If you must use wax, use non-skid floor wax.  Do not have throw rugs and other things on the floor that can make you trip. What can I do with my stairs?  Do not leave any items on the stairs.  Make sure that there are handrails on both sides of the stairs and use them. Fix handrails that are broken or loose. Make sure that handrails are as long as the stairways.  Check any carpeting to make sure that it is firmly attached to the stairs. Fix any carpet that is loose or worn.  Avoid having throw rugs at the top or bottom of the stairs. If you do have throw rugs, attach them to the floor with carpet tape.  Make sure that you have a light switch at the  top of the stairs and the bottom of the stairs. If you do not have them, ask someone to add them for you. What else can I do to help prevent falls?  Wear shoes that:  Do not have high heels.  Have rubber bottoms.  Are comfortable and fit you well.  Are closed at the toe. Do not wear sandals.  If you use a stepladder:  Make sure that it is fully opened. Do not climb a closed stepladder.  Make sure that both sides of the stepladder are locked into place.  Ask someone to hold it for you, if possible.  Clearly mark and make sure that you can see:  Any grab bars or handrails.  First and last steps.  Where the edge of each step is.  Use tools that help you move around (mobility aids) if they are needed. These include:  Canes.  Walkers.  Scooters.  Crutches.  Turn on the lights when you go into a dark area. Replace any light bulbs as soon as they burn out.  Set up your furniture so you have a clear path. Avoid moving your furniture around.  If any of your floors are uneven, fix them.  If there are any pets around you, be aware of where they are.  Review your medicines with your doctor. Some medicines can make you feel dizzy. This can increase your chance of falling. Ask your doctor what other things that you can do to help prevent falls. This information is not intended to replace advice given to you by your health care provider. Make sure you discuss any questions you have with your health care provider. Document Released: 01/11/2009 Document Revised: 08/23/2015 Document Reviewed: 04/21/2014 Elsevier Interactive Patient Education  2017 [employer].

## 2020-02-16 NOTE — Progress Notes (Addendum)
Subjective:   Lauren Bartlett is a 64 y.o. female who presents for Medicare Annual (Subsequent) preventive examination.  Review of Systems    No ROS.  Medicare Wellness Virtual Visit.   Cardiac Risk Factors include: advanced age (>24men, >2 women);hypertension     Objective:    Today's Vitals   02/16/20 1228  Weight: 247 lb (112 kg)  Height: 5\' 2"  (1.575 m)   Body mass index is 45.18 kg/m.  Advanced Directives 02/16/2020 11/26/2019 06/09/2019 06/09/2019 05/29/2019 05/21/2019 02/15/2019  Does Patient Have a Medical Advance Directive? No No No No No No No  Would patient like information on creating a medical advance directive? No - Patient declined - - - - - No - Patient declined    Current Medications (verified) Outpatient Encounter Medications as of 02/16/2020  Medication Sig   acetaminophen (TYLENOL) 500 MG tablet Take 500 mg by mouth 2 (two) times daily as needed for moderate pain or headache.   albuterol (PROVENTIL) (2.5 MG/3ML) 0.083% nebulizer solution INHALE THE CONTENTS OF 1 VIAL VIA NEBULIZER EVERY 6 HOURS AS NEEDED FOR WHEEZING OR FOR SHORTNESS OF BREATH   albuterol (VENTOLIN HFA) 108 (90 Base) MCG/ACT inhaler INHALE 2 PUFFS INTO THE LUNGS EVERY 6 HOURS AS NEEDED FOR WHEEZING OR SHORTNESS OF BREATH.  (USE WITH SPACER)   aspirin EC 81 MG EC tablet Take 1 tablet (81 mg total) by mouth daily.   Calcium Carb-Cholecalciferol 600-800 MG-UNIT TABS Take 1 tablet by mouth in the morning and at bedtime.   docusate sodium (COLACE) 100 MG capsule Take 100 mg by mouth daily as needed for mild constipation.   ergocalciferol (DRISDOL) 1.25 MG (50000 UT) capsule Take 1 capsule (50,000 Units total) by mouth once a week.   famotidine (PEPCID AC) 10 MG chewable tablet Chew 20 mg by mouth daily as needed for heartburn.    ferrous sulfate 325 (65 FE) MG tablet Take 325 mg by mouth daily with breakfast.   FLOVENT HFA 110 MCG/ACT inhaler INHALE 2 PUFFS TWICE DAILY   fluticasone (FLONASE) 50  MCG/ACT nasal spray USE 2 SPRAYS IN EACH NOSTRIL EVERY DAY   furosemide (LASIX) 20 MG tablet 20mg  (one tablet) once daily. May take additional tablet as needed for weight gain of 2 lbs overnight or 5 lbs in one week.   levothyroxine (SYNTHROID) 150 MCG tablet TAKE 1 TABLET EVERY DAY BEFORE BREAKFAST (INCREASED DOSE)   loratadine (CLARITIN) 10 MG tablet Take 10 mg by mouth daily.   losartan (COZAAR) 50 MG tablet TAKE 1 TABLET EVERY DAY   OXYGEN Inhale 2 L into the lungs at bedtime as needed (at bedtime and as needed).   oxymetazoline (AFRIN) 0.05 % nasal spray Place 1 spray into both nostrils 2 (two) times daily. As needed for nose bleed   polyethylene glycol powder (GLYCOLAX/MIRALAX) powder Take 17 g by mouth 2 (two) times daily as needed. (Patient taking differently: Take 17 g by mouth daily as needed for mild constipation. )   STIOLTO RESPIMAT 2.5-2.5 MCG/ACT AERS Inhale two puffs into lungs twice daily.   traMADol (ULTRAM) 50 MG tablet Take 1-2 tablets (50-100 mg total) by mouth every 4 (four) hours as needed for moderate pain.   vitamin B-12 (CYANOCOBALAMIN) 1000 MCG tablet Take 1 tablet (1,000 mcg total) by mouth daily.   No facility-administered encounter medications on file as of 02/16/2020.    Allergies (verified) Codeine, Levaquin [levofloxacin], Penicillins, Zithromax [azithromycin], and Prednisone   History: Past Medical History:  Diagnosis Date  Anxiety    Bleeding from the nose    Chronic diastolic congestive heart failure ([place])    a. 03/2016 Echo: EF 55-60%, Gr2 DD; b. 08/2016 Echo: EF 60-65%, nl diast fxn; c. 01/2019 Echo: EF 55-60%. Nl RV fxn.   Congenital absence of one kidney    COPD (chronic obstructive pulmonary disease) ([place])    a. 02/2017 PFT: mod/sev obstructive airway dzs w/ significant bronchodilator response.   Depression    [facility]   Dyspnea    Emphysema/COPD [facility])    GERD (gastroesophageal reflux disease)    History of cardiac cath    a.  05/2016 Cath: nl cors.   History of sciatica    Hypertension    Hypothyroidism    Mitral Valve Prolapse & Severe mitral regurgitation s/p MVR    a. 03/2016 Echo: severe MVP involving the posterior leaflet, sev MR;  b. 06/2016 min invasive MV repair w/ triangular resection of flail segment (P2) of posterior leaflet, gore-tex neochord placement x 4, Sorin Memo 3D rin Annulosplasty (1mm, catalog # [id number], ser # [id number]); c. 08/2016 Echo: MV area 2.06 cm^2 (pressure 1/2 time); d. 01/2019 Echo: Mild MS w/ mean grad 67mmHg.   Motion sickness    cars   Pneumonia    PONV (postoperative nausea and vomiting)    Post-op Afib    a. 06/2016 following MV repair-->short-course amio. Coumadin d/c'd 2/2 epistaxis.   Pulmonary hypertension ([place])    a. 08/2016 Echo: PASP 73mmHg; b. 01/2019 Echo: RVSP 45.35mmHg.   S/P patent foramen ovale closure    a. 06/2016 @ time of MV Repair.   Sleep apnea    O2 at night and PRN   Past Surgical History:  Procedure Laterality Date   ABDOMINAL HYSTERECTOMY     BREAST BIOPSY Right 2011   [facility]< benign   CATARACT EXTRACTION W/PHACO Left 11/14/2015   Procedure: CATARACT EXTRACTION PHACO AND INTRAOCULAR LENS PLACEMENT (IOC);  Surgeon: Leandrew Koyanagi, MD;  Location: [place];  Service: Ophthalmology;  Laterality: Left;  sleep apnea [place]   CATARACT EXTRACTION W/PHACO Right 12/19/2015   Procedure: CATARACT EXTRACTION PHACO AND INTRAOCULAR LENS PLACEMENT (IOC);  Surgeon: Leandrew Koyanagi, MD;  Location: [place];  Service: Ophthalmology;  Laterality: Right;  ANXIETY GENEROUS IV SEDATION TORIC LEN   COMBINED HYSTERECTOMY ABDOMINAL W/ A&P REPAIR / OOPHORECTOMY  1996   benign tumor   INNER EAR SURGERY     bilateral   MITRAL VALVE REPAIR Right 07/17/2016   Procedure: MINIMALLY INVASIVE MITRAL VALVE REPAIR (MVR) USING 26 SORIN MEMO 3D ANNULOPLASTY RING;  Surgeon: Rexene Alberts, MD;  Location: [place];  Service: Open Heart Surgery;  Laterality: Right;   PATENT  FORAMEN OVALE(PFO) CLOSURE N/A 07/17/2016   Procedure: PATENT FORAMEN OVALE (PFO) CLOSURE;  Surgeon: Rexene Alberts, MD;  Location: [place];  Service: Open Heart Surgery;  Laterality: N/A;   RIGHT/LEFT HEART CATH AND CORONARY ANGIOGRAPHY Bilateral 06/16/2016   Procedure: Right/Left Heart Cath and Coronary Angiography;  Surgeon: Wellington Hampshire, MD;  Location: [place] [facility];  Service: Cardiovascular;  Laterality: Bilateral;   TEE WITHOUT CARDIOVERSION N/A 05/19/2016   Procedure: TRANSESOPHAGEAL ECHOCARDIOGRAM (TEE);  Surgeon: Wellington Hampshire, MD;  Location: [facility];  Service: Cardiovascular;  Laterality: N/A;   TEE WITHOUT CARDIOVERSION N/A 06/09/2016   Procedure: TRANSESOPHAGEAL ECHOCARDIOGRAM (TEE);  Surgeon: Wellington Hampshire, MD;  Location: [facility];  Service: Cardiovascular;  Laterality: N/A;   TEE WITHOUT CARDIOVERSION N/A 06/16/2016  Procedure: TRANSESOPHAGEAL ECHOCARDIOGRAM (TEE);  Surgeon: Wellington Hampshire, MD;  Location: [facility];  Service: Cardiovascular;  Laterality: N/A;   TEE WITHOUT CARDIOVERSION N/A 07/17/2016   Procedure: TRANSESOPHAGEAL ECHOCARDIOGRAM (TEE);  Surgeon: Rexene Alberts, MD;  Location: [place];  Service: Open Heart Surgery;  Laterality: N/A;   TONSILLECTOMY     Family History  Problem Relation Age of Onset   Multiple sclerosis Mother    Hypertension Mother    Coronary artery disease Father    Heart disease Father    Hypertension Father    Heart disease Brother    Cancer Brother    Breast cancer Paternal Aunt    Social History   Socioeconomic History   Marital status: Single    Spouse name: Not on file   Number of children: Not on file   Years of education: Not on file   Highest education level: Not on file  Occupational History   Occupation: cna    Comment: part time  Tobacco Use   Smoking status: Never Smoker   Smokeless tobacco: Never Used  Scientific laboratory technician Use: Never used  Substance and Sexual Activity   Alcohol use: No   Drug use: No    Sexual activity: Not Currently  Other Topics Concern   Not on file  Social History Narrative   Lives in a group home for adults         Social Determinants of Health   Financial Resource Strain: Low Risk    Difficulty of Paying Living Expenses: Not hard at all  Food Insecurity: No Food Insecurity   Worried About Charity fundraiser in the Last Year: Never true   [place] in the Last Year: Never true  Transportation Needs: No Transportation Needs   Lack of Transportation (Medical): No   Lack of Transportation (Non-Medical): No  Physical Activity:    Days of Exercise per Week: Not on file   Minutes of Exercise per Session: Not on file  Stress: No Stress Concern Present   Feeling of Stress : Not at all  Social Connections: Unknown   Frequency of Communication with Friends and Family: More than three times a week   Frequency of Social Gatherings with Friends and Family: More than three times a week   Attends Religious Services: More than 4 times per year   Active Member of [employer] or Organizations: Not on file   Attends Archivist Meetings: Not on file   Marital Status: Never married    Tobacco Counseling Counseling given: Not Answered   Clinical Intake:  Pre-visit preparation completed: Yes        Diabetes: No  How often do you need to have someone help you when you read instructions, pamphlets, or other written materials from your doctor or pharmacy?: 1 - Never   Interpreter Needed?: No    Activities of Daily Living In your present state of health, do you have any difficulty performing the following activities: 02/16/2020 08/18/2019  Hearing? Y Y  Comment Hearing aid -  Vision? N N  Difficulty concentrating or making decisions? N N  Walking or climbing stairs? Y Y  Comment Walker/cane in use -  Dressing or bathing? N N  Doing errands, shopping? N N  Preparing Food and eating ? Y -  Comment Aide assist -  Using the Toilet? N -  In the  past six months, have you accidently leaked urine? N -  Do you  have problems with loss of bowel control? N -  Managing your Medications? N -  Managing your Finances? N -  Housekeeping or managing your Housekeeping? Y -  Comment Aide assist -  Some recent data might be hidden    Patient Care Team: [name], MD as PCP - General (Internal Medicine) Wellington Hampshire, MD as PCP - Cardiology (Cardiology)  Indicate any recent Medical Services you may have received from other than [facility] providers in the past year (date may be approximate).     Assessment:   This is a routine wellness examination for [place].  I connected with Jaydence today by telephone and verified that I am speaking with the correct person using two identifiers. Location patient: home Location provider: work Persons participating in the virtual visit: patient, Marine scientist.    I discussed the limitations, risks, security and privacy concerns of performing an evaluation and management service by telephone and the availability of in person appointments. The patient expressed understanding and verbally consented to this telephonic visit.    Interactive audio and video telecommunications were attempted between this provider and patient, however failed, due to patient having technical difficulties OR patient did not have access to video capability.  We continued and completed visit with audio only.  Some vital signs may be absent or patient reported.   Hearing/Vision screen  Hearing Screening   125Hz  250Hz  500Hz  1000Hz  2000Hz  3000Hz  4000Hz  6000Hz  8000Hz   Right ear:           Left ear:           Comments: Followed by [facility]  Hearing aids, bilateral  Vision Screening Comments: Followed by [facility]  Wears corrective lenses when reading  Cataract extraction, bilateral  They have regular follow up with the ophthalmologist  Dietary issues and exercise activities discussed: Current Exercise Habits: Home exercise  routine, Type of exercise: walking, Intensity: Mild  Regular diet Good water intake  Goals       Patient Stated     Increase physical activity (pt-stated)      Walk more for exercise. Lose weight.        Depression Screen PHQ 2/9 Scores 02/16/2020 02/15/2019 02/11/2018 02/10/2017 11/06/2016 08/22/2016 02/11/2016  PHQ - 2 Score 0 0 0 1 0 0 0  PHQ- 9 Score - - - 3 - - -    Fall Risk Fall Risk  02/16/2020 02/02/2020 11/02/2019 08/18/2019 07/04/2019  Falls in the past year? 0 0 0 0 0  Comment - - - - -  Number falls in past yr: 0 - - 0 0  Injury with Fall? 0 - - 0 0  Risk for fall due to : - - - Impaired balance/gait -  Follow up Falls evaluation completed Falls evaluation completed Falls evaluation completed Falls evaluation completed Falls evaluation completed   Handrails in use when climbing stairs? Yes Home free of loose throw rugs in walkways, pet beds, electrical cords, etc? Yes  Adequate lighting in your home to reduce risk of falls? Yes   ASSISTIVE DEVICES UTILIZED TO PREVENT FALLS: Life alert? Yes  Use of a cane, walker or w/c? Yes , walker/cane Grab bars in the bathroom? Yes   Shower chair or bench in shower? Yes  Elevated toilet seat or a handicapped toilet? Yes   TIMED UP AND GO: Was the test performed? No . Virtual visit.   Cognitive Function:  MMSE - Mini Mental State Exam 02/10/2017 02/11/2016  Orientation to time  5 5  Orientation to Place 5 5  Registration 3 3  Attention/ Calculation 5 5  Recall 3 3  Language- name 2 objects 2 2  Language- repeat 1 1  Language- follow 3 step command 3 3  Language- read & follow direction 1 1  Write a sentence 1 1  Copy design 1 1  Total score 30 30     6CIT Screen 02/16/2020 02/15/2019 02/11/2018 02/11/2016  What Year? 0 points 0 points 0 points 0 points  What month? 0 points 0 points 0 points 0 points  What time? 0 points 0 points 0 points 0 points  Count back from 20 0 points 0 points 0 points 0 points  Months in  reverse 0 points 0 points 0 points 0 points  Repeat phrase 0 points 0 points - -  Total Score 0 0 - -    Immunizations Immunization History  Administered Date(s) Administered   Influenza Split 01/02/2012   Influenza,inj,Quad PF,6+ Mos 03/13/2014, 01/08/2015, 02/03/2018, 01/26/2019, 02/02/2020   Influenza-Unspecified 01/11/2013, 12/30/2014   PFIZER SARS-COV-2 Vaccination 08/12/2019, 09/07/2019   Pneumococcal Conjugate-13 02/27/2015   Pneumococcal Polysaccharide-23 03/07/2013, 04/13/2019   Tdap 11/12/2009    TDAP status: Due, Education has been provided regarding the importance of this vaccine. Advised may receive this vaccine at local pharmacy or Health Dept. Aware to provide a copy of the vaccination record if obtained from local pharmacy or Health Dept. Verbalized acceptance and understanding. Deferred.   Health Maintenance Health Maintenance  Topic Date Due   TETANUS/TDAP  11/13/2019   MAMMOGRAM  12/05/2021   Fecal DNA (Cologuard)  01/26/2023   INFLUENZA VACCINE  Completed   COVID-19 Vaccine  Completed   Hepatitis C Screening  Completed   HIV Screening  Completed   PAP SMEAR-Modifier  Discontinued   Cologuard- 01/26/20. Repeat every 3 years.   Mammogram status: Completed 12/06/19. Repeat every year   Lung Cancer Screening: (Low Dose CT Chest recommended if Age 51-80 years, 30 pack-year currently smoking OR have quit w/in 15years.) does not qualify.   Hepatitis C Screening: Completed 01/08/15.  Vision Screening: Recommended annual ophthalmology exams for early detection of glaucoma and other disorders of the eye. Is the patient up to date with their annual eye exam?  Yes   Dental Screening: Recommended annual dental exams for proper oral hygiene.  Community Resource Referral / Chronic Care Management: CRR required this visit?  No   CCM required this visit?  No      Plan:   Keep all routine maintenance appointments.   Follow up 05/04/20 @ 1:00  I have personally  reviewed and noted the following in the patient's chart:   Medical and social history Use of alcohol, tobacco or illicit drugs  Current medications and supplements Functional ability and status Nutritional status Physical activity Advanced directives List of other physicians Hospitalizations, surgeries, and ER visits in previous 12 months Vitals Screenings to include cognitive, depression, and falls Referrals and appointments  In addition, I have reviewed and discussed with patient certain preventive protocols, quality metrics, and best practice recommendations. A written personalized care plan for preventive services as well as general preventive health recommendations were provided to patient via mail.     OBrien-Blaney, Rim Thatch L, LPN   [id number]     I have reviewed the above information and agree with above.   Deborra Medina, MD

## 2020-02-20 ENCOUNTER — Ambulatory Visit: Payer: Medicare HMO | Attending: Family | Admitting: Family

## 2020-02-20 ENCOUNTER — Encounter: Payer: Self-pay | Admitting: Family

## 2020-02-20 ENCOUNTER — Other Ambulatory Visit: Payer: Self-pay

## 2020-02-20 VITALS — Wt 248.5 lb

## 2020-02-20 DIAGNOSIS — I10 Essential (primary) hypertension: Secondary | ICD-10-CM

## 2020-02-20 DIAGNOSIS — J449 Chronic obstructive pulmonary disease, unspecified: Secondary | ICD-10-CM

## 2020-02-20 DIAGNOSIS — I5032 Chronic diastolic (congestive) heart failure: Secondary | ICD-10-CM

## 2020-02-20 NOTE — Progress Notes (Signed)
Virtual Visit via Telephone Note   Evaluation Performed:  Follow-up visit  This visit type was conducted due to national recommendations for restrictions regarding the COVID-19 Pandemic (e.g. social distancing).  This format is felt to be most appropriate for this patient at this time.  All issues noted in this document were discussed and addressed.  No physical exam was performed (except for noted visual exam findings with Video Visits).  Please refer to the patient's chart (MyChart message for video visits and phone note for telephone visits) for the patient's consent to telehealth for [facility]  Date:  02/20/2020   ID:  Lauren Bartlett, DOB 1956/01/23, MRN [id number]  Patient Location:  [place] [id number]   Provider location:   [facility] [address], [place] [address]  PCP:  [name], MD  Cardiologist:  Kathlyn Sacramento, MD  Electrophysiologist:  None   Chief Complaint:  Shortness of breath  History of Present Illness:    Lauren Bartlett is a 64 y.o. female who presents via audio/video conferencing for a telehealth visit today.  Patient verified DOB and address.  The patient does not have symptoms concerning for COVID-19 infection (fever, chills, cough, or new SHORTNESS OF BREATH).   Patient reports minimal shortness of breath upon moderate exertion. She describes this as chronic in nature having been present for several years. Continues to wear her oxygen at bedtime. She has associated fatigue along with this. She denies any dizziness, pedal edema/ abdominal distention, palpitations, chest pain, wheezing, cough or weight gain.   Has received 2 covid vaccines and her flu vaccine for this season  Prior CV studies:   The following studies were reviewed today:  Echo report from 02/14/2019 reviewed and showed an EF of 55-60% along with mild LVH, mild MS and moderately elevated PA pressure of 45.3 mmHg.    Past Medical History:  Diagnosis Date   Anxiety    Bleeding from the nose    Chronic diastolic congestive heart failure ([place])    a. 03/2016 Echo: EF 55-60%, Gr2 DD; b. 08/2016 Echo: EF 60-65%, nl diast fxn; c. 01/2019 Echo: EF 55-60%. Nl RV fxn.   Congenital absence of one kidney    COPD (chronic obstructive pulmonary disease) ([place])    a. 02/2017 PFT: mod/sev obstructive airway dzs w/ significant bronchodilator response.   Depression    [facility]   Dyspnea    Emphysema/COPD [facility])    GERD (gastroesophageal reflux disease)    History of cardiac cath    a. 05/2016 Cath: nl cors.   History of sciatica    Hypertension    Hypothyroidism    Mitral Valve Prolapse & Severe mitral regurgitation s/p MVR    a. 03/2016 Echo: severe MVP involving the posterior leaflet, sev MR;  b. 06/2016 min invasive MV repair w/ triangular resection of flail segment (P2) of posterior leaflet, gore-tex neochord placement x 4, Sorin Memo 3D rin Annulosplasty (34mm, catalog # [id number], ser # [id number]); c. 08/2016 Echo: MV area 2.06 cm^2 (pressure 1/2 time); d. 01/2019 Echo: Mild MS w/ mean grad 43mmHg.   Motion sickness    cars   Pneumonia    PONV (postoperative nausea and vomiting)    Post-op Afib    a. 06/2016 following MV repair-->short-course amio. Coumadin d/c'd 2/2 epistaxis.   Pulmonary hypertension ([place])    a. 08/2016 Echo: PASP 22mmHg; b. 01/2019 Echo: RVSP 45.90mmHg.  S/P patent foramen ovale closure    a. 06/2016 @ time of MV Repair.   Sleep apnea    O2 at night and PRN   Past Surgical History:  Procedure Laterality Date   ABDOMINAL HYSTERECTOMY     BREAST BIOPSY Right 2011   [facility]< benign   CATARACT EXTRACTION W/PHACO Left 11/14/2015   Procedure: CATARACT EXTRACTION PHACO AND INTRAOCULAR LENS PLACEMENT (IOC);  Surgeon: Leandrew Koyanagi, MD;  Location: [place];  Service: Ophthalmology;  Laterality: Left;  sleep apnea Toric   CATARACT EXTRACTION W/PHACO  Right 12/19/2015   Procedure: CATARACT EXTRACTION PHACO AND INTRAOCULAR LENS PLACEMENT (IOC);  Surgeon: Leandrew Koyanagi, MD;  Location: [place];  Service: Ophthalmology;  Laterality: Right;  ANXIETY GENEROUS IV SEDATION TORIC LEN   COMBINED HYSTERECTOMY ABDOMINAL W/ A&P REPAIR / OOPHORECTOMY  1996   benign tumor   INNER EAR SURGERY     bilateral   MITRAL VALVE REPAIR Right 07/17/2016   Procedure: MINIMALLY INVASIVE MITRAL VALVE REPAIR (MVR) USING 26 SORIN MEMO 3D ANNULOPLASTY RING;  Surgeon: Rexene Alberts, MD;  Location: [place];  Service: Open Heart Surgery;  Laterality: Right;   PATENT FORAMEN OVALE(PFO) CLOSURE N/A 07/17/2016   Procedure: PATENT FORAMEN OVALE (PFO) CLOSURE;  Surgeon: Rexene Alberts, MD;  Location: [place];  Service: Open Heart Surgery;  Laterality: N/A;   RIGHT/LEFT HEART CATH AND CORONARY ANGIOGRAPHY Bilateral 06/16/2016   Procedure: Right/Left Heart Cath and Coronary Angiography;  Surgeon: Wellington Hampshire, MD;  Location: [place] [facility];  Service: Cardiovascular;  Laterality: Bilateral;   TEE WITHOUT CARDIOVERSION N/A 05/19/2016   Procedure: TRANSESOPHAGEAL ECHOCARDIOGRAM (TEE);  Surgeon: Wellington Hampshire, MD;  Location: [facility];  Service: Cardiovascular;  Laterality: N/A;   TEE WITHOUT CARDIOVERSION N/A 06/09/2016   Procedure: TRANSESOPHAGEAL ECHOCARDIOGRAM (TEE);  Surgeon: Wellington Hampshire, MD;  Location: [facility];  Service: Cardiovascular;  Laterality: N/A;   TEE WITHOUT CARDIOVERSION N/A 06/16/2016   Procedure: TRANSESOPHAGEAL ECHOCARDIOGRAM (TEE);  Surgeon: Wellington Hampshire, MD;  Location: [facility];  Service: Cardiovascular;  Laterality: N/A;   TEE WITHOUT CARDIOVERSION N/A 07/17/2016   Procedure: TRANSESOPHAGEAL ECHOCARDIOGRAM (TEE);  Surgeon: Rexene Alberts, MD;  Location: [place];  Service: Open Heart Surgery;  Laterality: N/A;   TONSILLECTOMY       Current Meds  Medication Sig   acetaminophen (TYLENOL) 500 MG tablet Take 500 mg by mouth  2 (two) times daily as needed for moderate pain or headache.   albuterol (PROVENTIL) (2.5 MG/3ML) 0.083% nebulizer solution INHALE THE CONTENTS OF 1 VIAL VIA NEBULIZER EVERY 6 HOURS AS NEEDED FOR WHEEZING OR FOR SHORTNESS OF BREATH   albuterol (VENTOLIN HFA) 108 (90 Base) MCG/ACT inhaler INHALE 2 PUFFS INTO THE LUNGS EVERY 6 HOURS AS NEEDED FOR WHEEZING OR SHORTNESS OF BREATH.  (USE WITH SPACER)   aspirin EC 81 MG EC tablet Take 1 tablet (81 mg total) by mouth daily.   Calcium Carb-Cholecalciferol 600-800 MG-UNIT TABS Take 1 tablet by mouth in the morning, at noon, and at bedtime.    docusate sodium (COLACE) 100 MG capsule Take 100 mg by mouth daily as needed for mild constipation.   ergocalciferol (DRISDOL) 1.25 MG (50000 UT) capsule Take 1 capsule (50,000 Units total) by mouth once a week.   famotidine (PEPCID AC) 10 MG chewable tablet Chew 20 mg by mouth daily as needed for heartburn.    ferrous sulfate 325 (65 FE) MG tablet Take 325 mg by mouth daily with breakfast.  FLOVENT HFA 110 MCG/ACT inhaler INHALE 2 PUFFS TWICE DAILY   fluticasone (FLONASE) 50 MCG/ACT nasal spray USE 2 SPRAYS IN EACH NOSTRIL EVERY DAY (Patient taking differently: as needed. )   furosemide (LASIX) 20 MG tablet 20mg  (one tablet) once daily. May take additional tablet as needed for weight gain of 2 lbs overnight or 5 lbs in one week. (Patient taking differently: Take 10 mg by mouth daily. May take additional tablet as needed for weight gain of 2 lbs overnight or 5 lbs in one week.)   levothyroxine (SYNTHROID) 150 MCG tablet TAKE 1 TABLET EVERY DAY BEFORE BREAKFAST (INCREASED DOSE)   loratadine (CLARITIN) 10 MG tablet Take 10 mg by mouth daily.   losartan (COZAAR) 50 MG tablet TAKE 1 TABLET EVERY DAY   OXYGEN Inhale 2 L into the lungs at bedtime as needed (at bedtime and as needed).   oxymetazoline (AFRIN) 0.05 % nasal spray Place 1 spray into both nostrils 2 (two) times daily. As needed for nose bleed    polyethylene glycol powder (GLYCOLAX/MIRALAX) powder Take 17 g by mouth 2 (two) times daily as needed. (Patient taking differently: Take 17 g by mouth daily as needed for mild constipation. )   STIOLTO RESPIMAT 2.5-2.5 MCG/ACT AERS Inhale two puffs into lungs twice daily.   traMADol (ULTRAM) 50 MG tablet Take 1-2 tablets (50-100 mg total) by mouth every 4 (four) hours as needed for moderate pain.   vitamin B-12 (CYANOCOBALAMIN) 1000 MCG tablet Take 1 tablet (1,000 mcg total) by mouth daily.     Allergies:   Codeine, Levaquin [levofloxacin], Penicillins, Zithromax [azithromycin], and Prednisone   Social History   Tobacco Use   Smoking status: Never Smoker   Smokeless tobacco: Never Used  Vaping Use   Vaping Use: Never used  Substance Use Topics   Alcohol use: No   Drug use: No     Family Hx: The patient's family history includes Breast cancer in her paternal aunt; Cancer in her brother; Coronary artery disease in her father; Heart disease in her brother and father; Hypertension in her father and mother; Multiple sclerosis in her mother.  ROS:   Please see the history of present illness.     All other systems reviewed and are negative.   Labs/Other Tests and Data Reviewed:    Recent Labs: 06/09/2019: B Natriuretic Peptide 65.0 06/11/2019: Hemoglobin 13.6; Platelets 167 06/20/2019: ALT 26 11/02/2019: BUN 18; Creatinine, Ser 0.87; Magnesium 1.9; Potassium 4.1; Sodium 139; TSH 0.58   Recent Lipid Panel Lab Results  Component Value Date/Time   CHOL 200 01/26/2019 02:58 PM   TRIG 111.0 01/26/2019 02:58 PM   HDL 57.40 01/26/2019 02:58 PM   CHOLHDL 3 01/26/2019 02:58 PM   LDLCALC 121 (H) 01/26/2019 02:58 PM   LDLDIRECT 101.0 07/30/2017 10:07 AM    Wt Readings from Last 3 Encounters:  02/20/20 248 lb 8 oz (112.7 kg)  02/16/20 247 lb (112 kg)  02/02/20 247 lb 9.6 oz (112.3 kg)     Exam:    Vital Signs:  Wt 248 lb 8 oz (112.7 kg)    BMI 45.45 kg/m    Well nourished,  well developed female in no  acute distress.   ASSESSMENT & PLAN:    1.     Patient ID: JEANINNE LODICO, female    DOB: April 05, 1955, 64 y.o.   MRN: [id number]  HPI  Ms Ariola is a 64 y/o female with a history of HTN, CKD, thyroid disease, depression, COPD, anxiety,  pulmonary HTN, sleep apnea, GERD and chronic heart failure.   Echo report from 02/14/2019 reviewed and showed an EF of 55-60% along with mild LVH, mild AS and moderately elevated PA pressure.   Catheterization done 06/16/16 and showed:  The left ventricular systolic function is normal.  LV end diastolic pressure is normal.  The left ventricular ejection fraction is 55-65% by visual estimate.  There is severe (4+) mitral regurgitation.  1. Normal coronary arteries. Catheter-induced spasm in the ostial right coronary artery with no significant pressure dampening. This improved with catheter pullback. 2. Normal LV systolic function. 3. Severe mitral regurgitation by left ventricular angiography with dilated left atrium. 4. Right heart catheterization showed mildly elevated filling pressures, prominent V wave on pulmonary capillary wedge pressure, moderate pulmonary hypertension and high cardiac output. RA pressure: 6 mmHg, RV pressure: 53/7, PA pressure: 52/15 with a mean of 35 mmHg, pulmonary capillary wedge pressure: 17 mmHg. Cardiac output was 6.9 with a cardiac index of 3.5  Admitted 06/09/19 due to COPD exacerbation. Intolerant to oral steroids. Needed oxygen. Negative for COVID/ influenza. LE ultrasound negative for DVT. Given IV steroids and antibiotics. Discharged after 2 days.   She presents today for a follow-up visit with a chief complaint of minimal fatigue upon moderate exertion. She describes this as chronic in nature having been present for several years. She has associated shortness of breath, wheezing and sneezing along with this. She denies any difficulty sleeping, dizziness, abdominal distention, palpitations, pedal  edema, chest pain, cough or weight gain.   Past Medical History:  Diagnosis Date   Anxiety    Bleeding from the nose    Chronic diastolic congestive heart failure ([place])    a. 03/2016 Echo: EF 55-60%, Gr2 DD; b. 08/2016 Echo: EF 60-65%, nl diast fxn; c. 01/2019 Echo: EF 55-60%. Nl RV fxn.   Congenital absence of one kidney    COPD (chronic obstructive pulmonary disease) ([place])    a. 02/2017 PFT: mod/sev obstructive airway dzs w/ significant bronchodilator response.   Depression    [facility] at [facility]   Dyspnea    Emphysema/COPD [facility])    GERD (gastroesophageal reflux disease)    History of cardiac cath    a. 05/2016 Cath: nl cors.   History of sciatica    Hypertension    Hypothyroidism    Mitral Valve Prolapse & Severe mitral regurgitation s/p MVR    a. 03/2016 Echo: severe MVP involving the posterior leaflet, sev MR;  b. 06/2016 min invasive MV repair w/ triangular resection of flail segment (P2) of posterior leaflet, gore-tex neochord placement x 4, Sorin Memo 3D rin Annulosplasty (32mm, catalog # [id number], ser # [id number]); c. 08/2016 Echo: MV area 2.06 cm^2 (pressure 1/2 time); d. 01/2019 Echo: Mild MS w/ mean grad 71mmHg.   Motion sickness    cars   Pneumonia    PONV (postoperative nausea and vomiting)    Post-op Afib    a. 06/2016 following MV repair-->short-course amio. Coumadin d/c'd 2/2 epistaxis.   Pulmonary hypertension ([place])    a. 08/2016 Echo: PASP 59mmHg; b. 01/2019 Echo: RVSP 45.59mmHg.   S/P patent foramen ovale closure    a. 06/2016 @ time of MV Repair.   Sleep apnea    O2 at night and PRN   Past Surgical History:  Procedure Laterality Date   ABDOMINAL HYSTERECTOMY     BREAST BIOPSY Right 2011   [facility]< benign   CATARACT EXTRACTION W/PHACO Left 11/14/2015   Procedure: CATARACT EXTRACTION PHACO AND  INTRAOCULAR LENS PLACEMENT (IOC);  Surgeon: Leandrew Koyanagi, MD;  Location: [place];  Service: Ophthalmology;  Laterality: Left;  sleep  apnea Toric   CATARACT EXTRACTION W/PHACO Right 12/19/2015   Procedure: CATARACT EXTRACTION PHACO AND INTRAOCULAR LENS PLACEMENT (IOC);  Surgeon: Leandrew Koyanagi, MD;  Location: [place];  Service: Ophthalmology;  Laterality: Right;  ANXIETY GENEROUS IV SEDATION TORIC LEN   COMBINED HYSTERECTOMY ABDOMINAL W/ A&P REPAIR / OOPHORECTOMY  1996   benign tumor   INNER EAR SURGERY     bilateral   MITRAL VALVE REPAIR Right 07/17/2016   Procedure: MINIMALLY INVASIVE MITRAL VALVE REPAIR (MVR) USING 26 SORIN MEMO 3D ANNULOPLASTY RING;  Surgeon: Rexene Alberts, MD;  Location: [place];  Service: Open Heart Surgery;  Laterality: Right;   PATENT FORAMEN OVALE(PFO) CLOSURE N/A 07/17/2016   Procedure: PATENT FORAMEN OVALE (PFO) CLOSURE;  Surgeon: Rexene Alberts, MD;  Location: [place];  Service: Open Heart Surgery;  Laterality: N/A;   RIGHT/LEFT HEART CATH AND CORONARY ANGIOGRAPHY Bilateral 06/16/2016   Procedure: Right/Left Heart Cath and Coronary Angiography;  Surgeon: Wellington Hampshire, MD;  Location: [place] [facility];  Service: Cardiovascular;  Laterality: Bilateral;   TEE WITHOUT CARDIOVERSION N/A 05/19/2016   Procedure: TRANSESOPHAGEAL ECHOCARDIOGRAM (TEE);  Surgeon: Wellington Hampshire, MD;  Location: [facility];  Service: Cardiovascular;  Laterality: N/A;   TEE WITHOUT CARDIOVERSION N/A 06/09/2016   Procedure: TRANSESOPHAGEAL ECHOCARDIOGRAM (TEE);  Surgeon: Wellington Hampshire, MD;  Location: [facility];  Service: Cardiovascular;  Laterality: N/A;   TEE WITHOUT CARDIOVERSION N/A 06/16/2016   Procedure: TRANSESOPHAGEAL ECHOCARDIOGRAM (TEE);  Surgeon: Wellington Hampshire, MD;  Location: [facility];  Service: Cardiovascular;  Laterality: N/A;   TEE WITHOUT CARDIOVERSION N/A 07/17/2016   Procedure: TRANSESOPHAGEAL ECHOCARDIOGRAM (TEE);  Surgeon: Rexene Alberts, MD;  Location: [place];  Service: Open Heart Surgery;  Laterality: N/A;   TONSILLECTOMY     Family History  Problem Relation Age of Onset    Multiple sclerosis Mother    Hypertension Mother    Coronary artery disease Father    Heart disease Father    Hypertension Father    Heart disease Brother    Cancer Brother    Breast cancer Paternal Aunt    Social History   Tobacco Use   Smoking status: Never Smoker   Smokeless tobacco: Never Used  Substance Use Topics   Alcohol use: No   Allergies  Allergen Reactions   Codeine Hives and Nausea And Vomiting   Levaquin [Levofloxacin] Nausea And Vomiting   Penicillins Hives, Itching and Other (See Comments)    Has patient had a PCN reaction causing immediate rash, facial/tongue/throat swelling, SOB or lightheadedness with hypotension: No Has patient had a PCN reaction causing severe rash involving mucus membranes or skin necrosis: No Has patient had a PCN reaction that required hospitalization No Has patient had a PCN reaction occurring within the last 10 years: No If all of the above answers are "NO", then may proceed with Cephalosporin use.   Zithromax [Azithromycin] Other (See Comments)    Reaction:  Hallucinations    Prednisone Palpitations and Other (See Comments)    Reaction:  Hallucinations  Can take injections but not oral meds   Prior to Admission medications   Medication Sig Start Date End Date Taking? Authorizing Provider  acetaminophen (TYLENOL) 500 MG tablet Take 500 mg by mouth 2 (two) times daily as needed for moderate pain or headache.   Yes [provider]  albuterol (  PROVENTIL) (2.5 MG/3ML) 0.083% nebulizer solution INHALE THE CONTENTS OF 1 VIAL VIA NEBULIZER EVERY 6 HOURS AS NEEDED FOR WHEEZING OR FOR SHORTNESS OF BREATH  07/07/19  Yes [name], MD  albuterol (VENTOLIN HFA) 108 (90 Base) MCG/ACT inhaler INHALE 2 PUFFS INTO THE LUNGS EVERY 6 (SIX) HOURS AS NEEDED FOR WHEEZING OR SHORTNESS OF BREATH.  (USE WITH SPACER) 06/29/19  Yes [name], MD  aspirin EC 81 MG EC tablet Take 1 tablet (81 mg total) by mouth daily. 07/23/16  Yes  Barrett, Erin R, PA-C  Calcium Carb-Cholecalciferol 600-800 MG-UNIT TABS Take 1 tablet by mouth in the morning and at bedtime.   Yes [provider]  docusate sodium (COLACE) 100 MG capsule Take 100 mg by mouth daily as needed for mild constipation.   Yes [provider]  ergocalciferol (DRISDOL) 1.25 MG (50000 UT) capsule Take 1 capsule (50,000 Units total) by mouth once a week. 08/09/19  Yes [name], MD  famotidine (PEPCID AC) 10 MG chewable tablet Chew 20 mg by mouth daily as needed for heartburn.    Yes [provider]  ferrous sulfate 325 (65 FE) MG tablet Take 325 mg by mouth daily with breakfast.   Yes [provider]  fluticasone (FLONASE) 50 MCG/ACT nasal spray USE 2 SPRAYS IN EACH NOSTRIL EVERY DAY 01/10/19  Yes [name], MD  fluticasone (FLOVENT HFA) 110 MCG/ACT inhaler Inhale 2 puffs into the lungs 2 (two) times daily. 08/09/19 08/08/20 Yes [name], MD  furosemide (LASIX) 20 MG tablet Take 1 tablet (20 mg total) by mouth 2 (two) times daily. 06/02/19  Yes Theora Gianotti, NP  levothyroxine (SYNTHROID) 150 MCG tablet TAKE 1 TABLET (150 MCG TOTAL) BY MOUTH DAILY BEFORE BREAKFAST. (DOSE INCREASED). 06/23/19  Yes [name], MD  loratadine (CLARITIN) 10 MG tablet Take 10 mg by mouth daily.   Yes [provider]  losartan (COZAAR) 50 MG tablet TAKE 1 TABLET (50 MG TOTAL) BY MOUTH DAILY. 08/16/19  Yes [name], MD  OXYGEN Inhale 2 L into the lungs at bedtime as needed (at bedtime and as needed).   Yes [provider]  polyethylene glycol powder (GLYCOLAX/MIRALAX) powder Take 17 g by mouth 2 (two) times daily as needed. Patient taking differently: Take 17 g by mouth daily as needed for mild constipation.  08/03/15  Yes [name], MD  STIOLTO RESPIMAT 2.5-2.5 MCG/ACT AERS Inhale 2 puffs into the lungs in the morning and at bedtime. 08/15/19  Yes [name], MD  traMADol (ULTRAM) 50 MG tablet Take  1-2 tablets (50-100 mg total) by mouth every 4 (four) hours as needed for moderate pain. 08/21/16  Yes [name], MD  vitamin B-12 (CYANOCOBALAMIN) 1000 MCG tablet Take 1 tablet (1,000 mcg total) by mouth daily. 08/14/16  Yes [name], MD  oxymetazoline (AFRIN) 0.05 % nasal spray Place 1 spray into both nostrils 2 (two) times daily. As needed for nose bleed    [provider]     Review of Systems  Constitutional: Positive for fatigue. Negative for appetite change.  HENT: Positive for sneezing. Negative for congestion, postnasal drip and sore throat.   Eyes: Negative.   Respiratory: Positive for shortness of breath (minimal) and wheezing (sporadic). Negative for cough and chest tightness.   Cardiovascular: Negative for chest pain, palpitations and leg swelling.  Gastrointestinal: Negative for abdominal distention and abdominal pain.  Endocrine: Negative.   Genitourinary: Negative.  Musculoskeletal: Negative for back pain and neck pain.  Skin: Negative.   Allergic/Immunologic: Negative.   Neurological: Negative for dizziness and light-headedness.  Hematological: Negative for adenopathy. Does not bruise/bleed easily.  Psychiatric/Behavioral: Negative for dysphoric mood and sleep disturbance (sleeping on 3 pillows along with oxygen on 2L). The patient is not nervous/anxious.    Vitals:   02/20/20 1027  Weight: 248 lb 8 oz (112.7 kg)   Wt Readings from Last 3 Encounters:  02/20/20 248 lb 8 oz (112.7 kg)  02/16/20 247 lb (112 kg)  02/02/20 247 lb 9.6 oz (112.3 kg)   Lab Results  Component Value Date   CREATININE 0.87 11/02/2019   CREATININE 0.89 06/20/2019   CREATININE 0.89 06/11/2019    Physical Exam Vitals and nursing note reviewed.  Constitutional:      Appearance: Normal appearance.  HENT:     Head: Normocephalic and atraumatic.  Cardiovascular:     Rate and Rhythm: Normal rate and regular rhythm.  Pulmonary:     Effort: Pulmonary effort is normal.      Breath sounds: No wheezing or rales.  Abdominal:     General: There is no distension.     Palpations: Abdomen is soft.     Tenderness: There is no abdominal tenderness.  Musculoskeletal:        General: No tenderness.     Cervical back: Normal range of motion and neck supple.     Right lower leg: Edema (trace pitting) present.     Left lower leg: Edema (trace pitting) present.  Skin:    General: Skin is warm and dry.  Neurological:     General: No focal deficit present.     Mental Status: She is alert and oriented to person, place, and time.  Psychiatric:        Mood and Affect: Mood normal.        Behavior: Behavior normal.        Thought Content: Thought content normal.    Assessment & Plan:  1: Chronic HF with preserved ejection fraction- - NYHA class II - euvolemic today per patient's report - weighing daily and reminded to call for an overnight weight gain of >2 pounds or a weekly weight gain of >5 pounds - weight down 4 pounds from last visit here 6 months ago - not adding salt and has been reading food labels for sodium content; says that she doesn't eat fast food but does receive meals on wheels - understands that she needs to keep daily sodium content to 2000mg  sodium / day - encouraged her to elevate her legs when sitting for long periods of time - had tele visit with cardiology Gilford Rile) 10/06/19 - BNP 06/09/19 was 65.0  2: HTN- - says that her BP has been "good" when she checks it - saw PCP Derrel Nip) 02/02/20 - BMP 11/02/19 reviewed and showed sodium 139, potassium 4.1, creatinine 0.87 and GFR 65.57  3: COPD-  - saw pulmonology Elie Confer) 06/15/19 - wearing oxygen at 2L at bedtime and PRN during the day   COVID-19 Education: The signs and symptoms of COVID-19 were discussed with the patient and how to seek care for testing (follow up with PCP or arrange E-visit).  The importance of social distancing was discussed today.  Patient Risk:   After full review of this  patients clinical status, I feel that they are at least moderate risk at this time.  Time:   Today, I have spent 20 minutes with the patient  with telehealth technology discussing medications, diet and weights     Medication Adjustments/Labs and Tests Ordered: Current medicines are reviewed at length with the patient today.  Concerns regarding medicines are outlined above.   Tests Ordered: No orders of the defined types were placed in this encounter.  Medication Changes: No orders of the defined types were placed in this encounter.   Disposition: Follow-up in 6 months or sooner for any questions/problems before then. She requests a virtual visit again.   Signed, Alisa Graff, FNP  02/20/2020 10:33 AM    [facility]

## 2020-02-21 DIAGNOSIS — R6889 Other general symptoms and signs: Secondary | ICD-10-CM | POA: Diagnosis not present

## 2020-02-22 ENCOUNTER — Other Ambulatory Visit: Payer: Self-pay | Admitting: Internal Medicine

## 2020-03-02 ENCOUNTER — Other Ambulatory Visit: Payer: Self-pay

## 2020-03-02 ENCOUNTER — Emergency Department
Admission: EM | Admit: 2020-03-02 | Discharge: 2020-03-02 | Disposition: A | Discharge status: Home / Self Care | Payer: Medicare HMO | Attending: Emergency Medicine | Admitting: Emergency Medicine

## 2020-03-02 DIAGNOSIS — I110 Hypertensive heart disease with heart failure: Secondary | ICD-10-CM | POA: Insufficient documentation

## 2020-03-02 DIAGNOSIS — R040 Epistaxis: Secondary | ICD-10-CM | POA: Insufficient documentation

## 2020-03-02 DIAGNOSIS — R58 Hemorrhage, not elsewhere classified: Secondary | ICD-10-CM | POA: Diagnosis not present

## 2020-03-02 DIAGNOSIS — J441 Chronic obstructive pulmonary disease with (acute) exacerbation: Secondary | ICD-10-CM | POA: Diagnosis not present

## 2020-03-02 DIAGNOSIS — I5032 Chronic diastolic (congestive) heart failure: Secondary | ICD-10-CM | POA: Diagnosis not present

## 2020-03-02 DIAGNOSIS — Z79899 Other long term (current) drug therapy: Secondary | ICD-10-CM | POA: Insufficient documentation

## 2020-03-02 DIAGNOSIS — Z7982 Long term (current) use of aspirin: Secondary | ICD-10-CM | POA: Insufficient documentation

## 2020-03-02 DIAGNOSIS — G4489 Other headache syndrome: Secondary | ICD-10-CM | POA: Diagnosis not present

## 2020-03-02 DIAGNOSIS — R0902 Hypoxemia: Secondary | ICD-10-CM | POA: Diagnosis not present

## 2020-03-02 DIAGNOSIS — E039 Hypothyroidism, unspecified: Secondary | ICD-10-CM | POA: Diagnosis not present

## 2020-03-02 MED ORDER — LIDOCAINE VISCOUS HCL 2 % MT SOLN
15.0000 mL | Freq: Once | OROMUCOSAL | Status: AC
  Administered 2020-03-02: 15 mL via OROMUCOSAL
  Filled 2020-03-02: qty 15

## 2020-03-02 MED ORDER — CEPHALEXIN 500 MG PO CAPS: 500.0000 mg | ORAL_CAPSULE | Freq: Two times a day (BID) | ORAL | 0 refills | Status: DC

## 2020-03-02 MED ORDER — OXYMETAZOLINE HCL 0.05 % NA SOLN
1.0000 | Freq: Once | NASAL | Status: AC
  Administered 2020-03-02: 1 via NASAL
  Filled 2020-03-02: qty 30

## 2020-03-02 NOTE — ED Triage Notes (Signed)
Pt to ED via [facility] from home. Per EMS pt called out for uncontrolled nosebleed for a few hours. Pt placed nose clip on 1hr PTA. Pt stating prior nosebleed had to be cauterized a year ago. Pt stating she feels a little confused. Pt A&Ox4. Pt denies blood thinner use.

## 2020-03-02 NOTE — ED Notes (Signed)
Patient's face cleaned of dried blood.  Patient states she feels presentable

## 2020-03-02 NOTE — ED Provider Notes (Signed)
[facility] Emergency Department Provider Note   ____________________________________________    I have reviewed the triage vital signs and the nursing notes.   HISTORY  Chief Complaint Epistaxis     HPI Lauren Bartlett is a 64 y.o. female who presents with complaints of epistaxis.  She reports she is not on blood thinners.  She reports that started this morning.  She thinks she is bleeding from both nostrils.  She reports this is happened before approximately 1 year ago and she required cauterization.  No lightheadedness.  No fevers chills.  No trauma.  Has not taken anything for this, did put a nose clip on 1 hour prior to arrival.  Past Medical History:  Diagnosis Date  . Anxiety   . Bleeding from the nose   . Chronic diastolic congestive heart failure ([place])    a. 03/2016 Echo: EF 55-60%, Gr2 DD; b. 08/2016 Echo: EF 60-65%, nl diast fxn; c. 01/2019 Echo: EF 55-60%. Nl RV fxn.  . Congenital absence of one kidney   . COPD (chronic obstructive pulmonary disease) ([place])    a. 02/2017 PFT: mod/sev obstructive airway dzs w/ significant bronchodilator response.  . Depression    treated at [facility]  . Dyspnea   . Emphysema/COPD ([place])   . GERD (gastroesophageal reflux disease)   . History of cardiac cath    a. 05/2016 Cath: nl cors.  . History of sciatica   . Hypertension   . Hypothyroidism   . Mitral Valve Prolapse & Severe mitral regurgitation s/p MVR    a. 03/2016 Echo: severe MVP involving the posterior leaflet, sev MR;  b. 06/2016 min invasive MV repair w/ triangular resection of flail segment (P2) of posterior leaflet, gore-tex neochord placement x 4, Sorin Memo 3D rin Annulosplasty (48mm, catalog # [id number], ser # [id number]); c. 08/2016 Echo: MV area 2.06 cm^2 (pressure 1/2 time); d. 01/2019 Echo: Mild MS w/ mean grad 39mmHg.  . Motion sickness    cars  . Pneumonia   . PONV (postoperative nausea and vomiting)   . Post-op Afib    a. 06/2016 following MV  repair-->short-course amio. Coumadin d/c'd 2/2 epistaxis.  . Pulmonary hypertension ([place])    a. 08/2016 Echo: PASP 29mmHg; b. 01/2019 Echo: RVSP 45.57mmHg.  . S/P patent foramen ovale closure    a. 06/2016 @ time of MV Repair.  . Sleep apnea    O2 at night and PRN    Patient Active Problem List   Diagnosis Date Noted  . COPD exacerbation ([place]) 06/10/2019  . PAF (paroxysmal atrial fibrillation) ([place]) 12/18/2018  . Nocturnal hypoxemia due to obstructive chronic bronchitis ([place]) 02/06/2018  . Grief reaction with prolonged bereavement 08/01/2017  . Encounter for therapeutic drug monitoring 08/13/2016  . S/P minimally invasive mitral valve repair 07/17/2016  . S/P patent foramen ovale closure 07/17/2016  . Chronic diastolic congestive heart failure ([place])   . COPD (chronic obstructive pulmonary disease) ([place]) 05/19/2016  . 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  . Hospital discharge follow-up 08/05/2015  . Vitamin D deficiency 01/11/2015  . Allergic rhinitis 07/09/2014  . S/P hysterectomy with oophorectomy 09/22/2013  . Pulmonary hypertension ([place])   . Hypocalcemia 09/10/2012  . Hypothyroidism 05/04/2012  . Obesity (BMI 30-39.9) 08/04/2011  . Hypertension   . Congenital absence of one kidney   . History of sciatica     Past Surgical History:  Procedure Laterality Date  . ABDOMINAL HYSTERECTOMY    . BREAST BIOPSY Right 2011   [facility]< benign  .  CATARACT EXTRACTION W/PHACO Left 11/14/2015   Procedure: CATARACT EXTRACTION PHACO AND INTRAOCULAR LENS PLACEMENT (IOC);  Surgeon: Leandrew Koyanagi, MD;  Location: [facility];  Service: Ophthalmology;  Laterality: Left;  sleep apnea [place]  . CATARACT EXTRACTION W/PHACO Right 12/19/2015   Procedure: CATARACT EXTRACTION PHACO AND INTRAOCULAR LENS PLACEMENT (IOC);  Surgeon: Leandrew Koyanagi, MD;  Location: [place];  Service: Ophthalmology;  Laterality: Right;  ANXIETY GENEROUS IV SEDATION TORIC LEN  . COMBINED HYSTERECTOMY ABDOMINAL W/  A&P REPAIR / OOPHORECTOMY  1996   benign tumor  . INNER EAR SURGERY     bilateral  . MITRAL VALVE REPAIR Right 07/17/2016   Procedure: MINIMALLY INVASIVE MITRAL VALVE REPAIR (MVR) USING 26 SORIN MEMO 3D ANNULOPLASTY RING;  Surgeon: Rexene Alberts, MD;  Location: [facility] [place];  Service: Open Heart Surgery;  Laterality: Right;  . PATENT FORAMEN OVALE(PFO) CLOSURE N/A 07/17/2016   Procedure: PATENT FORAMEN OVALE (PFO) CLOSURE;  Surgeon: Rexene Alberts, MD;  Location: [facility];  Service: Open Heart Surgery;  Laterality: N/A;  . RIGHT/LEFT HEART CATH AND CORONARY ANGIOGRAPHY Bilateral 06/16/2016   Procedure: Right/Left Heart Cath and Coronary Angiography;  Surgeon: Wellington Hampshire, MD;  Location: [facility];  Service: Cardiovascular;  Laterality: Bilateral;  . TEE WITHOUT CARDIOVERSION N/A 05/19/2016   Procedure: TRANSESOPHAGEAL ECHOCARDIOGRAM (TEE);  Surgeon: Wellington Hampshire, MD;  Location: [facility];  Service: Cardiovascular;  Laterality: N/A;  . TEE WITHOUT CARDIOVERSION N/A 06/09/2016   Procedure: TRANSESOPHAGEAL ECHOCARDIOGRAM (TEE);  Surgeon: Wellington Hampshire, MD;  Location: [facility];  Service: Cardiovascular;  Laterality: N/A;  . TEE WITHOUT CARDIOVERSION N/A 06/16/2016   Procedure: TRANSESOPHAGEAL ECHOCARDIOGRAM (TEE);  Surgeon: Wellington Hampshire, MD;  Location: [facility];  Service: Cardiovascular;  Laterality: N/A;  . TEE WITHOUT CARDIOVERSION N/A 07/17/2016   Procedure: TRANSESOPHAGEAL ECHOCARDIOGRAM (TEE);  Surgeon: Rexene Alberts, MD;  Location: [place];  Service: Open Heart Surgery;  Laterality: N/A;  . TONSILLECTOMY      Prior to Admission medications   Medication Sig Start Date End Date Taking? Authorizing Provider  acetaminophen (TYLENOL) 500 MG tablet Take 500 mg by mouth 2 (two) times daily as needed for moderate pain or headache.    [provider]  albuterol (PROVENTIL) (2.5 MG/3ML) 0.083% nebulizer solution INHALE THE CONTENTS OF 1 VIAL VIA NEBULIZER EVERY 6 HOURS AS NEEDED  FOR WHEEZING OR FOR SHORTNESS OF BREATH 02/13/20   [name], MD  albuterol (VENTOLIN HFA) 108 (90 Base) MCG/ACT inhaler INHALE 2 PUFFS INTO THE LUNGS EVERY 6 HOURS AS NEEDED FOR WHEEZING OR SHORTNESS OF BREATH.  (USE WITH SPACER) 11/03/19   [name], MD  aspirin EC 81 MG EC tablet Take 1 tablet (81 mg total) by mouth daily. 07/23/16   Barrett, Lodema Hong, PA-C  Calcium Carb-Cholecalciferol 600-800 MG-UNIT TABS Take 1 tablet by mouth in the morning, at noon, and at bedtime.     [provider]  cephALEXin (KEFLEX) 500 MG capsule Take 1 capsule (500 mg total) by mouth 2 (two) times daily. 03/02/20   Lavonia Drafts, MD  docusate sodium (COLACE) 100 MG capsule Take 100 mg by mouth daily as needed for mild constipation.    [provider]  ergocalciferol (DRISDOL) 1.25 MG (50000 UT) capsule Take 1 capsule (50,000 Units total) by mouth once a week. 10/20/19   [name], MD  famotidine (PEPCID AC) 10 MG chewable tablet Chew 20 mg by mouth daily as needed for heartburn.  [provider]  ferrous sulfate 325 (65 FE) MG tablet Take 325 mg by mouth daily with breakfast.    [provider]  FLOVENT HFA 110 MCG/ACT inhaler INHALE 2 PUFFS TWICE DAILY 02/27/20   [name], MD  fluticasone (FLONASE) 50 MCG/ACT nasal spray USE 2 SPRAYS IN EACH NOSTRIL EVERY DAY Patient taking differently: as needed.  01/10/19   [name], MD  furosemide (LASIX) 20 MG tablet 20mg  (one tablet) once daily. May take additional tablet as needed for weight gain of 2 lbs overnight or 5 lbs in one week. Patient taking differently: Take 10 mg by mouth daily. May take additional tablet as needed for weight gain of 2 lbs overnight or 5 lbs in one week. 01/18/20   Loel Dubonnet, NP  levothyroxine (SYNTHROID) 150 MCG tablet TAKE 1 TABLET EVERY DAY BEFORE BREAKFAST (INCREASED DOSE) 01/18/20   [name], MD  loratadine (CLARITIN) 10 MG tablet Take 10 mg by mouth daily.     [provider]  losartan (COZAAR) 50 MG tablet TAKE 1 TABLET EVERY DAY 12/20/19   [name], MD  OXYGEN Inhale 2 L into the lungs at bedtime as needed (at bedtime and as needed).    [provider]  oxymetazoline (AFRIN) 0.05 % nasal spray Place 1 spray into both nostrils 2 (two) times daily. As needed for nose bleed    [provider]  polyethylene glycol powder (GLYCOLAX/MIRALAX) powder Take 17 g by mouth 2 (two) times daily as needed. Patient taking differently: Take 17 g by mouth daily as needed for mild constipation.  08/03/15   [name], MD  STIOLTO RESPIMAT 2.5-2.5 MCG/ACT AERS Inhale two puffs into lungs twice daily. 08/24/19   [name], MD  traMADol (ULTRAM) 50 MG tablet Take 1-2 tablets (50-100 mg total) by mouth every 4 (four) hours as needed for moderate pain. 08/21/16   [name], MD  vitamin B-12 (CYANOCOBALAMIN) 1000 MCG tablet Take 1 tablet (1,000 mcg total) by mouth daily. 08/14/16   [name], MD     Allergies Codeine, Levaquin [levofloxacin], Penicillins, Zithromax [azithromycin], and Prednisone  Family History  Problem Relation Age of Onset  . Multiple sclerosis Mother   . Hypertension Mother   . Coronary artery disease Father   . Heart disease Father   . Hypertension Father   . Heart disease Brother   . Cancer Brother   . Breast cancer Paternal Aunt     Social History Social History   Tobacco Use  . Smoking status: Never Smoker  . Smokeless tobacco: Never Used  Vaping Use  . Vaping Use: Never used  Substance Use Topics  . Alcohol use: No  . Drug use: No    Review of Systems  Constitutional: No fever/chills Eyes: No visual changes.  ENT: As above Cardiovascular: Denies chest pain. Respiratory: Denies shortness of breath. Gastrointestinal: No abdominal pain.    Genitourinary: Negative for dysuria. Musculoskeletal: Negative for back pain. Skin: Negative for rash. Neurological: Negative for  headaches or weakness   ____________________________________________   PHYSICAL EXAM:  VITAL SIGNS: ED Triage Vitals  Enc Vitals Group     BP 03/02/20 1259 (!) 141/72     Pulse Rate 03/02/20 1259 93     Resp 03/02/20 1259 (!) 174     Temp 03/02/20 1259 98.5 F (36.9 C)     Temp Source 03/02/20 1259 Oral     SpO2 03/02/20 1259 96 %  Weight 03/02/20 1256 112.7 kg (248 lb 7.7 oz)     Height 03/02/20 1256 1.575 m (5\' 2" )     Head Circumference --      Peak Flow --      Pain Score 03/02/20 1256 0     Pain Loc --      Pain Edu? --      Excl. in [place]? --     Constitutional: Alert and oriented. No acute distress. Eyes: Conjunctivae are normal.   Nose: Nasal clamp in place, no active nasal bleed Mouth/Throat: Mucous membranes are moist.  Small amount of blood in the posterior pharynx  Cardiovascular: Normal rate, regular rhythm. Grossly normal heart sounds.  Good peripheral circulation. Respiratory: Normal respiratory effort.  No retractions. Lungs CTAB. Gastrointestinal: Soft and nontender. No distention.  No CVA tenderness.  Musculoskeletal:   Warm and well perfused Neurologic:  Normal speech and language. No gross focal neurologic deficits are appreciated.  Skin:  Skin is warm, dry and intact. No rash noted. Psychiatric: Mood and affect are normal. Speech and behavior are normal.  ____________________________________________   LABS (all labs ordered are listed, but only abnormal results are displayed)  Labs Reviewed - No data to display ____________________________________________  EKG  None ____________________________________________  RADIOLOGY  None ____________________________________________   PROCEDURES  Procedure(s) performed: yes  .Epistaxis Management  Date/Time: 03/02/2020 2:45 PM Performed by: Lavonia Drafts, MD Authorized by: Lavonia Drafts, MD   Consent:    Consent obtained:  Verbal   Consent given by:  Patient   Risks discussed:   Bleeding, nasal injury, infection and pain   Alternatives discussed:  No treatment Anesthesia (see MAR for exact dosages):    Anesthesia method:  Topical application   Topical anesthetic:  Lidocaine gel Procedure details:    Treatment site:  L anterior   Treatment method:  Merocel sponge Post-procedure details:    Assessment:  Bleeding stopped   Patient tolerance of procedure:  Tolerated with difficulty     Critical Care performed: No ____________________________________________   INITIAL IMPRESSION / ASSESSMENT AND PLAN / ED COURSE  Pertinent labs & imaging results that were available during my care of the patient were reviewed by me and considered in my medical decision making (see chart for details).  Patient presents with epistaxis as detailed above.  No history of thrombocytopenia.  Will remove clamp and use Afrin and viscous lidocaine to numb up the nasal passages in case we need to insert a Merisel   Merisel inserted after continued bleeding from the left naris, patient tolerated with difficulty however bleeding controlled.  We will have her follow-up closely with ENT, antibiotic prophylaxis given as she notes a history of sinus infections   ____________________________________________   FINAL CLINICAL IMPRESSION(S) / ED DIAGNOSES  Final diagnoses:  Epistaxis  Anterior epistaxis        Note:  This document was prepared using Dragon voice recognition software and may include unintentional dictation errors.   Lavonia Drafts, MD 03/02/20 1446

## 2020-03-02 NOTE — ED Notes (Signed)
Basic labs sent. °

## 2020-03-05 ENCOUNTER — Other Ambulatory Visit: Payer: Self-pay | Admitting: Internal Medicine

## 2020-03-07 DIAGNOSIS — J340 Abscess, furuncle and carbuncle of nose: Secondary | ICD-10-CM | POA: Diagnosis not present

## 2020-03-07 DIAGNOSIS — R040 Epistaxis: Secondary | ICD-10-CM | POA: Diagnosis not present

## 2020-03-07 DIAGNOSIS — J449 Chronic obstructive pulmonary disease, unspecified: Secondary | ICD-10-CM | POA: Diagnosis not present

## 2020-03-12 DIAGNOSIS — J449 Chronic obstructive pulmonary disease, unspecified: Secondary | ICD-10-CM | POA: Diagnosis not present

## 2020-03-23 ENCOUNTER — Other Ambulatory Visit: Payer: Self-pay | Admitting: Internal Medicine

## 2020-03-23 DIAGNOSIS — E034 Atrophy of thyroid (acquired): Secondary | ICD-10-CM

## 2020-04-05 ENCOUNTER — Telehealth: Payer: Self-pay | Admitting: Pulmonary Disease

## 2020-04-05 ENCOUNTER — Telehealth: Payer: Self-pay | Admitting: Cardiovascular Disease

## 2020-04-05 NOTE — Telephone Encounter (Signed)
Former Dr. Sung Amabile patient. New to Dr. Jayme Cloud.  Patient is questioning if 04/16/2020 visit can be by phone due to covid/hospital concerns.   Dr. Jayme Cloud, please advise. Thanks.

## 2020-04-05 NOTE — Telephone Encounter (Signed)
It would not be my preferred mode as I have never seen her however, will accommodate her this 1 time.

## 2020-04-05 NOTE — Telephone Encounter (Signed)
°  Patient Consent for Virtual Visit         Lauren Bartlett has provided verbal consent on 04/05/2020 for a virtual visit (video or telephone).   CONSENT FOR VIRTUAL VISIT FOR:  Lauren Bartlett  By participating in this virtual visit I agree to the following:  I hereby voluntarily request, consent and authorize [facility] and its employed or contracted physicians, physician assistants, nurse practitioners or other licensed health care professionals (the Practitioner), to provide me with telemedicine health care services (the Services") as deemed necessary by the treating Practitioner. I acknowledge and consent to receive the Services by the Practitioner via telemedicine. I understand that the telemedicine visit will involve communicating with the Practitioner through live audiovisual communication technology and the disclosure of certain medical information by electronic transmission. I acknowledge that I have been given the opportunity to request an in-person assessment or other available alternative prior to the telemedicine visit and am voluntarily participating in the telemedicine visit.  I understand that I have the right to withhold or withdraw my consent to the use of telemedicine in the course of my care at any time, without affecting my right to future care or treatment, and that the Practitioner or I may terminate the telemedicine visit at any time. I understand that I have the right to inspect all information obtained and/or recorded in the course of the telemedicine visit and may receive copies of available information for a reasonable fee.  I understand that some of the potential risks of receiving the Services via telemedicine include:   Delay or interruption in medical evaluation due to technological equipment failure or disruption;  Information transmitted may not be sufficient (e.g. poor resolution of images) to allow for appropriate medical decision making by the Practitioner;  and/or   In rare instances, security protocols could fail, causing a breach of personal health information.  Furthermore, I acknowledge that it is my responsibility to provide information about my medical history, conditions and care that is complete and accurate to the best of my ability. I acknowledge that Practitioner's advice, recommendations, and/or decision may be based on factors not within their control, such as incomplete or inaccurate data provided by me or distortions of diagnostic images or specimens that may result from electronic transmissions. I understand that the practice of medicine is not an exact science and that Practitioner makes no warranties or guarantees regarding treatment outcomes. I acknowledge that a copy of this consent can be made available to me via my patient portal [facility] MyChart), or I can request a printed copy by calling the office of [facility].    I understand that my insurance will be billed for this visit.   I have read or had this consent read to me.  I understand the contents of this consent, which adequately explains the benefits and risks of the Services being provided via telemedicine.   I have been provided ample opportunity to ask questions regarding this consent and the Services and have had my questions answered to my satisfaction.  I give my informed consent for the services to be provided through the use of telemedicine in my medical care

## 2020-04-05 NOTE — Telephone Encounter (Signed)
Patient is aware of below message and voiced her understanding.  appt note has been updated to reflect phone visit.  Nothing further needed.

## 2020-04-07 ENCOUNTER — Other Ambulatory Visit: Payer: Self-pay | Admitting: Family

## 2020-04-07 DIAGNOSIS — J449 Chronic obstructive pulmonary disease, unspecified: Secondary | ICD-10-CM | POA: Diagnosis not present

## 2020-04-08 NOTE — Progress Notes (Signed)
Virtual Visit via Telephone Note   This visit type was conducted due to national recommendations for restrictions regarding the COVID-19 Pandemic (e.g. social distancing) in an effort to limit this patient's exposure and mitigate transmission in our community.  Due to her co-morbid illnesses, this patient is at least at moderate risk for complications without adequate follow up.  This format is felt to be most appropriate for this patient at this time.  The patient did not have access to video technology/had technical difficulties with video requiring transitioning to audio format only (telephone).  All issues noted in this document were discussed and addressed.  No physical exam could be performed with this format.  Please refer to the patient's chart for her  consent to telehealth for [facility].   Date:  04/09/2020   ID:  Lauren Bartlett, DOB [birth year], MRN [id number]  Patient Location: Home Provider Location: Office/Clinic  PCP:  [name], MD  Cardiologist:  Kathlyn Sacramento, MD  Electrophysiologist:  None   Evaluation Performed:  Follow-Up Visit  Chief Complaint:  65 y.o. female with history of mitral valve prolapse and severe mitral regurgitation s/p mitral valve repair (April 2018), hypertension, HFpEF, COPD, pulmonary arterial HTN, congenital solitary kidney, hypothyroidism, and who presents for follow-up.  Chief Complaint  Patient presents with   Follow-up    6 month; "doing well."      History of Present Illness:    Lauren Bartlett is a 65 y.o. female with history of mitral valve prolapse and severe MR s/p mitral valve repair 06/2016, hypertension, HFpEF, COPD, pulmonary arterial hypertension, congenital solitary kidney, hypothyroidism, and who is seen today via telemedicine for follow-up of hypertension and valvular/cardiac disease.  She has a history of mitral valve disease s/p repair due to mitral valve prolapse and severe MR 06/2016.  Catheterization prior to valvular  repair revealed normal cors.  Echo 08/2016 showed normal LVEF, mild to moderate TR, PAH with PASP 54 mmHg.  01/2019 echo showed normal LV SF, mild mitral stenosis, RVSP 45.3 mmHg.  He had multiple ED visits in February 2021 due to dyspnea and treated with Solu-Medrol.  She was seen in our clinic with increasing dyspnea and Lasix increased to 40 mg daily for 1 week due to fluid retention, suspected 2/2 recent steroids.  She was admitted 06/09/2019 to 06/11/2019 for COPD exacerbation and possible exacerbation of HFpEF.  She was treated with steroids, antibiotics, and IV diuresed with improvement in symptoms.  She was discharged with supplemental oxygen.  She was last seen via telemedicine 10/06/2019 and reportedly felt well from a cardiac standpoint.  She reported stable dyspnea on exertion.  She had some mild wheezing, attributed to her COPD, and reportedly at her baseline.  She also reported a longstanding / unchanged /chronic dry cough.  She had not recently used her albuterol.  She was trying to be more active.  She reported palpitations that woke her from sleep/dreaming.  She felt as if they may be 2/2 her dream.  No reported daytime palpitations.  She reported mild ankle edema.  She did feel as if she was eating more sodium over the weekend with weight stable.  She reported obtaining low-sodium meals through [employer].  She intended to elevate her feet more often.  Recommendations were for continued Lasix 20 mg daily with extra tablet as needed for weight gain of 2 pounds overnight or 5 pounds in 1 week.  She was advised to continue to follow-up with pulmonology  as directed.  Today, 04/09/2020, she is seen via telemedicine for follow-up of her cardiac conditions.  She denies any symptoms consistent with angina.  She continues to note stable dyspnea on exertion when compared with previous visits.  She has a dry cough and some wheezing, unchanged from baseline.  She continues to note episodes of waking  overnight, reporting she believes that she wakes due to a bad dream.  After waking, and suspected as 2/2 her bad dream, she then notes racing heart rate.  She does not feel as if she wakes due to racing heart rate or palpitations --rather that she wakes due to a bad dream with subsequent racing heart rate and palpitations.  She reports walking around her home for activity.  She estimates at least 1 mile per day.  She is not walking as far as she used to in the past, given that she gets tired more easily as of late.  She is eager to start walking outside again; however, she reports that she is unable to walk in the cold and will need to wait until it is warmer outside.  She reportedly has an aide at home.  In addition, she receives low-salt and heart healthy [employer] food and reports a healthy diet.  She continues to note periodic swelling in her ankles that improves with leg elevation.  She has recently noted some nosebleeds, attributed to the dry air.  She is sleeping with a humidifier.  She monitors her BP at home with SBP 120s.  She is sleeping with 2-3 pillows, which she reports is not new for her and the same as previous visits.  She is taking Lasix 20 mg daily and monitoring her weight.  Upon discussion of updating her echo, she reports that she does not do well in the cold and would prefer to avoid exposure to coronavirus but would be amenable to updating her echo and coming into the office sometime during spring 2022.  No signs or symptoms of bleeding.  She reports medication compliance.  The patient does not have symptoms concerning for COVID-19 infection (fever, chills, cough, or new shortness of breath).   Past Medical History:  Diagnosis Date   Anxiety    Bleeding from the nose    Chronic diastolic congestive heart failure ([place])    a. 03/2016 Echo: EF 55-60%, Gr2 DD; b. 08/2016 Echo: EF 60-65%, nl diast fxn; c. 01/2019 Echo: EF 55-60%. Nl RV fxn.   Congenital absence of one kidney     COPD (chronic obstructive pulmonary disease) ([place])    a. 02/2017 PFT: mod/sev obstructive airway dzs w/ significant bronchodilator response.   Depression    [facility] at [facility]   Dyspnea    Emphysema/COPD [facility])    GERD (gastroesophageal reflux disease)    History of cardiac cath    a. 05/2016 Cath: nl cors.   History of sciatica    Hypertension    Hypothyroidism    Mitral Valve Prolapse & Severe mitral regurgitation s/p MVR    a. 03/2016 Echo: severe MVP involving the posterior leaflet, sev MR;  b. 06/2016 min invasive MV repair w/ triangular resection of flail segment (P2) of posterior leaflet, gore-tex neochord placement x 4, Sorin Memo 3D rin Annulosplasty (40mm, catalog # [id number], ser # [id number]); c. 08/2016 Echo: MV area 2.06 cm^2 (pressure 1/2 time); d. 01/2019 Echo: Mild MS w/ mean grad 60mmHg.   Motion sickness    cars   Pneumonia    PONV (  postoperative nausea and vomiting)    Post-op Afib    a. 06/2016 following MV repair-->short-course amio. Coumadin d/c'd 2/2 epistaxis.   Pulmonary hypertension ([place])    a. 08/2016 Echo: PASP 80mmHg; b. 01/2019 Echo: RVSP 45.78mmHg.   S/P patent foramen ovale closure    a. 06/2016 @ time of MV Repair.   Sleep apnea    O2 at night and PRN   Past Surgical History:  Procedure Laterality Date   ABDOMINAL HYSTERECTOMY     BREAST BIOPSY Right 2011   [facility]< benign   CATARACT EXTRACTION W/PHACO Left 11/14/2015   Procedure: CATARACT EXTRACTION PHACO AND INTRAOCULAR LENS PLACEMENT (IOC);  Surgeon: Leandrew Koyanagi, MD;  Location: [place];  Service: Ophthalmology;  Laterality: Left;  sleep apnea [place]   CATARACT EXTRACTION W/PHACO Right 12/19/2015   Procedure: CATARACT EXTRACTION PHACO AND INTRAOCULAR LENS PLACEMENT (IOC);  Surgeon: Leandrew Koyanagi, MD;  Location: [place];  Service: Ophthalmology;  Laterality: Right;  ANXIETY GENEROUS IV SEDATION TORIC LEN   COMBINED HYSTERECTOMY ABDOMINAL W/ A&P REPAIR /  OOPHORECTOMY  1996   benign tumor   INNER EAR SURGERY     bilateral   MITRAL VALVE REPAIR Right 07/17/2016   Procedure: MINIMALLY INVASIVE MITRAL VALVE REPAIR (MVR) USING 26 SORIN MEMO 3D ANNULOPLASTY RING;  Surgeon: Rexene Alberts, MD;  Location: [place];  Service: Open Heart Surgery;  Laterality: Right;   PATENT FORAMEN OVALE(PFO) CLOSURE N/A 07/17/2016   Procedure: PATENT FORAMEN OVALE (PFO) CLOSURE;  Surgeon: Rexene Alberts, MD;  Location: [place];  Service: Open Heart Surgery;  Laterality: N/A;   RIGHT/LEFT HEART CATH AND CORONARY ANGIOGRAPHY Bilateral 06/16/2016   Procedure: Right/Left Heart Cath and Coronary Angiography;  Surgeon: Wellington Hampshire, MD;  Location: [place] [facility];  Service: Cardiovascular;  Laterality: Bilateral;   TEE WITHOUT CARDIOVERSION N/A 05/19/2016   Procedure: TRANSESOPHAGEAL ECHOCARDIOGRAM (TEE);  Surgeon: Wellington Hampshire, MD;  Location: [facility];  Service: Cardiovascular;  Laterality: N/A;   TEE WITHOUT CARDIOVERSION N/A 06/09/2016   Procedure: TRANSESOPHAGEAL ECHOCARDIOGRAM (TEE);  Surgeon: Wellington Hampshire, MD;  Location: [facility];  Service: Cardiovascular;  Laterality: N/A;   TEE WITHOUT CARDIOVERSION N/A 06/16/2016   Procedure: TRANSESOPHAGEAL ECHOCARDIOGRAM (TEE);  Surgeon: Wellington Hampshire, MD;  Location: [facility];  Service: Cardiovascular;  Laterality: N/A;   TEE WITHOUT CARDIOVERSION N/A 07/17/2016   Procedure: TRANSESOPHAGEAL ECHOCARDIOGRAM (TEE);  Surgeon: Rexene Alberts, MD;  Location: [place];  Service: Open Heart Surgery;  Laterality: N/A;   TONSILLECTOMY       Current Meds  Medication Sig   acetaminophen (TYLENOL) 500 MG tablet Take 500 mg by mouth 2 (two) times daily as needed for moderate pain or headache.   albuterol (PROVENTIL) (2.5 MG/3ML) 0.083% nebulizer solution INHALE THE CONTENTS OF 1 VIAL VIA NEBULIZER EVERY 6 HOURS AS NEEDED FOR WHEEZING OR FOR SHORTNESS OF BREATH   albuterol (VENTOLIN HFA) 108 (90 Base) MCG/ACT inhaler  INHALE 2 PUFFS INTO THE LUNGS EVERY 6 HOURS AS NEEDED FOR WHEEZING OR SHORTNESS OF BREATH.  (USE WITH SPACER)   aspirin EC 81 MG EC tablet Take 1 tablet (81 mg total) by mouth daily.   Calcium Carb-Cholecalciferol 600-800 MG-UNIT TABS Take 1 tablet by mouth in the morning, at noon, and at bedtime.    docusate sodium (COLACE) 100 MG capsule Take 100 mg by mouth daily as needed for mild constipation.   ergocalciferol (DRISDOL) 1.25 MG (50000 UT) capsule Take 1 capsule (50,000 Units total)  by mouth once a week.   famotidine (PEPCID AC) 10 MG chewable tablet Chew 20 mg by mouth daily as needed for heartburn.    ferrous sulfate 325 (65 FE) MG tablet Take 325 mg by mouth daily with breakfast.   FLOVENT HFA 110 MCG/ACT inhaler INHALE 2 PUFFS TWICE DAILY   fluticasone (FLONASE) 50 MCG/ACT nasal spray USE 2 SPRAYS IN EACH NOSTRIL EVERY DAY (Patient taking differently: as needed.)   furosemide (LASIX) 20 MG tablet 20mg  (one tablet) once daily. May take additional tablet as needed for weight gain of 2 lbs overnight or 5 lbs in one week. (Patient taking differently: Take 10 mg by mouth daily. May take additional tablet as needed for weight gain of 2 lbs overnight or 5 lbs in one week.)   levothyroxine (SYNTHROID) 150 MCG tablet TAKE 1 TABLET EVERY DAY BEFORE BREAKFAST (INCREASED DOSE)   loratadine (CLARITIN) 10 MG tablet Take 10 mg by mouth daily.   losartan (COZAAR) 50 MG tablet TAKE 1 TABLET EVERY DAY   OXYGEN Inhale 2 L into the lungs at bedtime as needed (at bedtime and as needed).   oxymetazoline (AFRIN) 0.05 % nasal spray Place 1 spray into both nostrils 2 (two) times daily. As needed for nose bleed   polyethylene glycol powder (GLYCOLAX/MIRALAX) powder Take 17 g by mouth 2 (two) times daily as needed. (Patient taking differently: Take 17 g by mouth daily as needed for mild constipation.)   Tiotropium Bromide-Olodaterol (STIOLTO RESPIMAT) 2.5-2.5 MCG/ACT AERS INHALE  2  PUFFS  INTO  THE   LUNGS EVERY DAY   traMADol (ULTRAM) 50 MG tablet Take 1-2 tablets (50-100 mg total) by mouth every 4 (four) hours as needed for moderate pain.   vitamin B-12 (CYANOCOBALAMIN) 1000 MCG tablet Take 1 tablet (1,000 mcg total) by mouth daily.     Allergies:   Codeine, Levaquin [levofloxacin], Penicillins, Zithromax [azithromycin], and Prednisone   Social History   Tobacco Use   Smoking status: Never Smoker   Smokeless tobacco: Never Used  Vaping Use   Vaping Use: Never used  Substance Use Topics   Alcohol use: No   Drug use: No     Family Hx: The patient's family history includes Breast cancer in her paternal aunt; Cancer in her brother; Coronary artery disease in her father; Heart disease in her brother and father; Hypertension in her father and mother; Multiple sclerosis in her mother.  ROS:   Please see the history of present illness.    She denies chest pain, pnd, n, v, dizziness, syncope, weight gain, or early satiety.  She reports tachypalpitations after waking from bad dreams at night.  She believes she wakes in the middle the night due to bad dreams that cause racing heart rate, rather than waking due to racing heart rate.  She does not wake up short of breath.  She reports fatigue with exertion, attributed to deconditioning and with an eagerness to start walking outside once the weather is warmer.  She reports stable dyspnea with mild wheezing and dry cough, as noted at previous visits.  She reports ongoing two-pillow orthopnea that is unchanged from previous visits.  No recent falls.  She reports mild ankle edema, unchanged from previous visits. All other systems reviewed and are negative.   Objective:    Vital Signs:  BP 127/80    Pulse 83    Ht 5\' 2"  (1.575 m)    Wt 247 lb 6 oz (112.2 kg)    BMI 45.25  kg/m    VITAL SIGNS:  reviewed  Accessory clinical findings reviewed:    EKG:  No ECG reviewed.  Previous vitals reviewed today:    Temp Readings from Last 3  Encounters:  03/02/20 98.5 F (36.9 C) (Oral)  02/02/20 97.7 F (36.5 C) (Oral)  11/26/19 97.9 F (36.6 C) (Oral)   BP Readings from Last 3 Encounters:  04/09/20 127/80  03/02/20 (!) 153/93  02/02/20 120/74   Pulse Readings from Last 3 Encounters:  04/09/20 83  03/02/20 91  02/02/20 75    Wt Readings from Last 3 Encounters:  04/09/20 247 lb 6 oz (112.2 kg)  03/02/20 248 lb 7.7 oz (112.7 kg)  02/20/20 248 lb 8 oz (112.7 kg)    - Labs reviewed today:     Lab Results  Component Value Date   WBC 9.4 06/11/2019   HGB 13.6 06/11/2019   HCT 42.6 06/11/2019   MCV 95.7 06/11/2019   PLT 167 06/11/2019   Lab Results  Component Value Date   CREATININE 0.87 11/02/2019   BUN 18 11/02/2019   NA 139 11/02/2019   K 4.1 11/02/2019   CL 101 11/02/2019   CO2 31 11/02/2019   Lab Results  Component Value Date   ALT 26 06/20/2019   AST 28 06/20/2019   ALKPHOS 68 06/20/2019   BILITOT 0.5 06/20/2019   Lab Results  Component Value Date   CHOL 200 01/26/2019   HDL 57.40 01/26/2019   LDLCALC 121 (H) 01/26/2019   LDLDIRECT 101.0 07/30/2017   TRIG 111.0 01/26/2019   CHOLHDL 3 01/26/2019    Lab Results  Component Value Date   HGBA1C 5.5 01/26/2019   Lab Results  Component Value Date   TSH 0.58 11/02/2019     Prior CV Studies reviewed today:    The following studies were reviewed today:  [place] LE 05/2019 IMPRESSION: No femoropopliteal DVT nor evidence of DVT within the visualized calf veins.  Echo 01/2019 1. Left ventricular ejection fraction, by visual estimation, is 55 to  60%. The left ventricle has normal function. There is mildly increased  left ventricular hypertrophy.  2. Global right ventricle has normal systolic function.The right  ventricular size is normal. No increase in right ventricular wall  thickness.  3. The mitral valve has been repaired/replaced. No evidence of mitral  valve regurgitation. Mild mitral stenosis with mean gradient of 8 mm Hg.   4. The tricuspid valve is normal in structure. Tricuspid valve  regurgitation is trivial.  5. The aortic valve is normal in structure. Aortic valve regurgitation is  not visualized. No evidence of aortic valve sclerosis or stenosis.  6. Moderately elevated pulmonary artery systolic pressure.  7. The inferior vena cava is dilated in size with >50% respiratory  variability, suggesting right atrial pressure of 8 mmHg.  8. The tricuspid regurgitant velocity is 3.05 m/s, and with an assumed  right atrial pressure of 8 mmHg, the estimated right ventricular systolic  pressure is moderately elevated at 45.3 mmHg.   [facility] 05/2016  The left ventricular systolic function is normal.  LV end diastolic pressure is normal.  The left ventricular ejection fraction is 55-65% by visual estimate.  There is severe (4+) mitral regurgitation. 1. Normal coronary arteries. Catheter-induced spasm in the ostial right coronary artery with no significant pressure dampening. This improved with catheter pullback. 2. Normal LV systolic function. 3. Severe mitral regurgitation by left ventricular angiography with dilated left atrium. 4. Right heart catheterization showed mildly elevated filling pressures,  prominent V wave on pulmonary capillary wedge pressure, moderate pulmonary hypertension and high cardiac output. RA pressure: 6 mmHg, RV pressure: 53/7, PA pressure: 52/15 with a mean of 35 mmHg, pulmonary capillary wedge pressure: 17 mmHg. Cardiac output was 6.9 with a cardiac index of 3.5 Recommendations: Recommend evaluation for mitral valve repair. The patient needs to see pulmonary in order to make sure she is a candidate.   ASSESSMENT & PLAN:    Chronic HFpEF Pulmonary arterial hypertension -- Reports stable symptoms compared with previous visits and with consideration of known COPD.  She has stable dyspnea, though she does note increasing fatigue with less distance ambulated.   She attributes fatigue  to deconditioning.  She denies any increase in ankle edema or signs/symptoms of worsening volume status.  She notes stable weight.  Also reports unchanged wheezing and dry cough from previous visits. She finds it difficult to walk in the colder weather and she looks forward to warmer weather.  Previous echo shows normal LVSF and RVSP 45.3 mmHg.  Tentative plan for repeat echo spring 2022 to monitor heart pressures and valvular disease.  Based on updated echo, could consider referral to pulmonary hypertension clinic if ongoing symptoms and RVSP still elevated as above.  For now, continue current sliding scale Lasix at 20 mg daily with additional tablet for weight gain of 2 pounds overnight or 5 pounds in a week.  Continue daily weights, BP checks.  Continue to monitor fluid intake and salt intake.  Recommend increase activity as tolerated.  Continue to follow-up with pulmonology as directed.  COPD -- Reports stable dyspnea, wheezing, and dry cough in the setting of her COPD.  Continue current inhalers as directed.  Continue to follow with pulmonology as directed.  Essential hypertension -- Reports BP well controlled on current medications.  Most recent reported BP with diastolic pressure borderline.  No medication changes recommended today.  Continue to log BP at home.  Call the office if consistently elevated over 130/80.  Reviewed total daily recommendations for salt and fluids.  S/p mitral valve repair -- She denies any signs or symptoms of worsening valvular disease.  Previous echo as above with normal LV SF and only mild mitral stenosis and mean gradient of 8 mmHg.  Recommend continue to monitor with periodic echo.  She is agreeable to obtaining an echo when next able to come into the office, estimated approximately spring 2022.  Ongoing heart rate and blood pressure control recommended.  COVID-19 Education: The signs and symptoms of COVID-19 were discussed with the patient and how to seek care for  testing (follow up with PCP or arrange E-visit).  The importance of social distancing was discussed today.  Time:   Today, I have spent 30 minutes total in reviewing the pt chart and with the patient and telehealth technology discussing the above problems.     Medication Adjustments/Labs and Tests Ordered: Current medicines are reviewed at length with the patient today.  Concerns regarding medicines are outlined above.   Medication changes: None Labs ordered: None Studies / Imaging ordered: Echo Future considerations: ?PHTN clinic if indicated Disposition: In Person in 6 month(s)  Signed, Magda Bernheim  04/09/2020  [place]

## 2020-04-09 ENCOUNTER — Encounter: Payer: Self-pay | Admitting: Physician Assistant

## 2020-04-09 ENCOUNTER — Telehealth (INDEPENDENT_AMBULATORY_CARE_PROVIDER_SITE_OTHER): Payer: Medicare HMO | Admitting: Physician Assistant

## 2020-04-09 ENCOUNTER — Other Ambulatory Visit: Payer: Self-pay

## 2020-04-09 VITALS — BP 127/80 | HR 83 | Ht 62.0 in | Wt 247.4 lb

## 2020-04-09 DIAGNOSIS — I10 Essential (primary) hypertension: Secondary | ICD-10-CM | POA: Diagnosis not present

## 2020-04-09 DIAGNOSIS — Z9889 Other specified postprocedural states: Secondary | ICD-10-CM | POA: Diagnosis not present

## 2020-04-09 DIAGNOSIS — Z79899 Other long term (current) drug therapy: Secondary | ICD-10-CM | POA: Diagnosis not present

## 2020-04-09 DIAGNOSIS — I5032 Chronic diastolic (congestive) heart failure: Secondary | ICD-10-CM | POA: Diagnosis not present

## 2020-04-09 DIAGNOSIS — E034 Atrophy of thyroid (acquired): Secondary | ICD-10-CM

## 2020-04-09 DIAGNOSIS — J449 Chronic obstructive pulmonary disease, unspecified: Secondary | ICD-10-CM | POA: Diagnosis not present

## 2020-04-09 DIAGNOSIS — I050 Rheumatic mitral stenosis: Secondary | ICD-10-CM

## 2020-04-09 DIAGNOSIS — I2721 Secondary pulmonary arterial hypertension: Secondary | ICD-10-CM | POA: Diagnosis not present

## 2020-04-09 NOTE — Patient Instructions (Signed)
Medication Instructions:  Your physician recommends that you continue on your current medications as directed. Please refer to the Current Medication list given to you today.  *If you need a refill on your cardiac medications before your next appointment, please call your pharmacy*   Lab Work: None ordered    Testing/Procedures: Your physician has requested that you have an echocardiogram in the Spring. Our office will call you to schedule.  Echocardiography is a painless test that uses sound waves to create images of your heart. It provides your doctor with information about the size and shape of your heart and how well your heart's chambers and valves are working. This procedure takes approximately one hour. There are no restrictions for this procedure.   Follow-Up: At [facility], you and your health needs are our priority.  As part of our continuing mission to provide you with exceptional heart care, we have created designated Provider Care Teams.  These Care Teams include your primary Cardiologist (physician) and Advanced Practice Providers (APPs -  Physician Assistants and Nurse Practitioners) who all work together to provide you with the care you need, when you need it.  We recommend signing up for the patient portal called "MyChart".  Sign up information is provided on this After Visit Summary.  MyChart is used to connect with patients for Virtual Visits (Telemedicine).  Patients are able to view lab/test results, encounter notes, upcoming appointments, etc.  Non-urgent messages can be sent to your provider as well.   To learn more about what you can do with MyChart, go to [email or web address].    Your next appointment:   6 month(s)  The format for your next appointment:   In Person  Provider:   You may see Kathlyn Sacramento, MD or one of the following Advanced Practice Providers on your designated Care Team:     Marrianne Mood, [place]

## 2020-04-12 DIAGNOSIS — J449 Chronic obstructive pulmonary disease, unspecified: Secondary | ICD-10-CM | POA: Diagnosis not present

## 2020-04-16 ENCOUNTER — Ambulatory Visit: Payer: Medicare HMO | Admitting: Pulmonary Disease

## 2020-05-04 ENCOUNTER — Ambulatory Visit: Payer: Medicare HMO | Admitting: Internal Medicine

## 2020-05-08 DIAGNOSIS — J449 Chronic obstructive pulmonary disease, unspecified: Secondary | ICD-10-CM | POA: Diagnosis not present

## 2020-05-11 ENCOUNTER — Other Ambulatory Visit: Payer: Self-pay | Admitting: Internal Medicine

## 2020-05-13 DIAGNOSIS — J449 Chronic obstructive pulmonary disease, unspecified: Secondary | ICD-10-CM | POA: Diagnosis not present

## 2020-05-21 ENCOUNTER — Other Ambulatory Visit: Payer: Self-pay | Admitting: Internal Medicine

## 2020-05-23 ENCOUNTER — Ambulatory Visit (INDEPENDENT_AMBULATORY_CARE_PROVIDER_SITE_OTHER): Payer: Medicare HMO | Admitting: Pulmonary Disease

## 2020-05-23 ENCOUNTER — Other Ambulatory Visit: Payer: Self-pay

## 2020-05-23 DIAGNOSIS — J449 Chronic obstructive pulmonary disease, unspecified: Secondary | ICD-10-CM

## 2020-05-23 DIAGNOSIS — G4736 Sleep related hypoventilation in conditions classified elsewhere: Secondary | ICD-10-CM

## 2020-05-23 DIAGNOSIS — I2721 Secondary pulmonary arterial hypertension: Secondary | ICD-10-CM | POA: Diagnosis not present

## 2020-05-23 DIAGNOSIS — K766 Portal hypertension: Secondary | ICD-10-CM | POA: Diagnosis not present

## 2020-05-23 NOTE — Patient Instructions (Signed)
We will see the patient in follow-up in 3 months time  She is to call us sooner should any new problems arise.

## 2020-05-23 NOTE — Progress Notes (Signed)
Subjective:    Patient ID: Lauren Bartlett, female    DOB: [birth year], 65 y.o.   MRN: [id number]  Requesting MD/Service: Derrel Nip          Date of initial consultation: 03/07/16           Reason for consultation: tracheobronchomalacia, DOE  PT PROFILE: 66 y.o. F never smoker with significant lifetime exposure to second hand smoke with multiple medical problems including chronic hypoxic respiratory failure, moderate COPD, mitral valve prolapse and regurgitation, pulm htn by echocardiogram, appearance of tracheomalacia on CT chest previously followed by Dr Raul Del and referred for further evaluation of severe exertional limitation. Initial eval suggested severe mitral regurgitation and impression was that this was a major contributor to her severe DOE  DATA: Echocardiogram 02/01/13: Normal LVEF. MVP with moderate MR, Moderate AI, severe TI. RVSP est 91 mmHg CT chest 07/29/15: Cardiomegaly with left atrial dilatation. Tracheobronchomalacia. Air trapping in the lungs bilaterally, indicative of small airways disease Echocardiogram 06/13/14: Normal LVEF, Severe MR, RVSP est 62 mmHg Spirometry 03/12/15: FVC 1.94 L (73% pred). FEV1 1.19 L (55% pred), FEV1/FVC 61% 6MWT 03/12/15: 225 meters, no desaturation LE venous US 07/29/15: No DVT Echocardiogram 07/31/15: LVEF 60-65%. MV poorly visualized, trivial MR, LA size normal, TV poorly visualized, RVSP not estimated CT chest 03/11/16: Stable changes of tracheobronchomalacia Echocardiogram 04/15/16: Normal LVEF, Severe MR, moderate PAH Mitral valvuloplasty 07/17/16: @ [place] by Dr Roxy Manns Echocardiogram 09/15/16: LVEF 60-65%. RVSP estimate 54 mmHg ONO 10/21/16: on RA. 45 mins < 89%, lowest SpO2 84% PFTs 03/26/17: FVC: 1.73 > 1.74 L (63 %pred), FEV1: 1.01 > 1.16 L (45> 52%pred, 15% improvement after bronchodilator), FEV1/FVC: 59 >67%, TLC: 3.27 L (73 %pred), DLCO 46 %pred, DLCO/VA 103%   INTERVAL:  Last seen  05/2019 by Eric Form NP.  At that time she had  been admitted to [facility] for COPD exacerbation between 11 March through 13 March. No major pulmonary problems since her last visit. Virtual Visit Via Video or Telephone Note:   This visit type was conducted due to national recommendations for restrictions regarding the COVID-19 pandemic .  This format is felt to be most appropriate for this patient at this time.  All issues noted in this document were discussed and addressed.  No physical exam was performed (except for noted visual exam findings with Video Visits).    I connected with Lauren Bartlett by telephone at 10:55 AM and verified that I was speaking with the correct person using two identifiers. Location patient: home Location provider: [facility] Persons participating in the virtual visit: patient, physician   I discussed the limitations, risks, security and privacy concerns of performing an evaluation and management service by video and the availability of in person appointments. The patient expressed understanding and agreed to proceed.  HPI  Her only complaint today is that she has been battling "allergies".  On [place] doing well.  No complaints otherwise.  She has not had fevers, chills or sweats.  Nasal hygiene is helping her.  Uses oxygen nocturnally.  Compliant with oxygen and feels this helps her.  Last visit here 15 June 2019 seen by Eric Form, NP.  No major problems since then.  She has moderate pulmonary hypertension and has had issues with mitral valve replacement.  She follows with cardiology for this.  Review of Systems A 10 point review of systems was performed and it is as noted above otherwise negative.  We reviewed her medications and they are as  noted.    Objective:   Physical Exam  No physical exam performed as the visit was via telephone.  Patient did not exhibit conversational dyspnea during the visit.      Assessment & Plan:     ICD-10-CM   1. Chronic asthmatic bronchitis  (HCC)  J44.9    Continue Stiolto and Flovent Seems to be well controlled with this  2. Nocturnal hypoxemia due to obstructive chronic bronchitis (HCC)  J44.9    G47.36    Continue oxygen at 2 L/min, compliant Notes to be refreshed upon awakening in the morning  3. PAH (pulmonary arterial hypertension) with portal hypertension (HCC)  I27.21    K76.6    This issue adds complexity to her management Adds to her hypoxia Continue supplemental O2   We will see the patient in follow-up in 3 months time she is to contact us prior to that time should any new problems arise.  Continue medications as they currently are.  Continue supplemental O2.  Total visit time of non-face-to-face encounter :15 minutes.  Renold Don, MD [facility]   *This note was dictated using voice recognition software/Dragon.  Despite best efforts to proofread, errors can occur which can change the meaning.  Any change was purely unintentional.

## 2020-05-25 DIAGNOSIS — H04123 Dry eye syndrome of bilateral lacrimal glands: Secondary | ICD-10-CM | POA: Diagnosis not present

## 2020-05-29 ENCOUNTER — Other Ambulatory Visit: Payer: Self-pay | Admitting: Internal Medicine

## 2020-06-01 ENCOUNTER — Ambulatory Visit: Payer: Medicare HMO | Admitting: Internal Medicine

## 2020-06-05 DIAGNOSIS — J449 Chronic obstructive pulmonary disease, unspecified: Secondary | ICD-10-CM | POA: Diagnosis not present

## 2020-06-06 ENCOUNTER — Other Ambulatory Visit: Payer: Self-pay | Admitting: Internal Medicine

## 2020-06-06 DIAGNOSIS — E034 Atrophy of thyroid (acquired): Secondary | ICD-10-CM

## 2020-06-10 DIAGNOSIS — J449 Chronic obstructive pulmonary disease, unspecified: Secondary | ICD-10-CM | POA: Diagnosis not present

## 2020-06-13 ENCOUNTER — Other Ambulatory Visit: Payer: Self-pay

## 2020-06-13 ENCOUNTER — Other Ambulatory Visit: Payer: Self-pay | Admitting: Internal Medicine

## 2020-06-13 ENCOUNTER — Encounter: Payer: Self-pay | Admitting: Internal Medicine

## 2020-06-13 ENCOUNTER — Ambulatory Visit (INDEPENDENT_AMBULATORY_CARE_PROVIDER_SITE_OTHER): Payer: Medicare HMO | Admitting: Internal Medicine

## 2020-06-13 VITALS — BP 128/86 | HR 85 | Temp 97.9°F | Resp 16 | Ht 62.0 in | Wt 247.2 lb

## 2020-06-13 DIAGNOSIS — E559 Vitamin D deficiency, unspecified: Secondary | ICD-10-CM

## 2020-06-13 DIAGNOSIS — I10 Essential (primary) hypertension: Secondary | ICD-10-CM | POA: Diagnosis not present

## 2020-06-13 DIAGNOSIS — Z6841 Body Mass Index (BMI) 40.0 and over, adult: Secondary | ICD-10-CM | POA: Diagnosis not present

## 2020-06-13 DIAGNOSIS — E034 Atrophy of thyroid (acquired): Secondary | ICD-10-CM | POA: Diagnosis not present

## 2020-06-13 DIAGNOSIS — I5032 Chronic diastolic (congestive) heart failure: Secondary | ICD-10-CM | POA: Diagnosis not present

## 2020-06-13 DIAGNOSIS — E669 Obesity, unspecified: Secondary | ICD-10-CM

## 2020-06-13 DIAGNOSIS — J42 Unspecified chronic bronchitis: Secondary | ICD-10-CM

## 2020-06-13 DIAGNOSIS — J449 Chronic obstructive pulmonary disease, unspecified: Secondary | ICD-10-CM | POA: Diagnosis not present

## 2020-06-13 DIAGNOSIS — R040 Epistaxis: Secondary | ICD-10-CM

## 2020-06-13 LAB — CBC WITH DIFFERENTIAL/PLATELET
Basophils Absolute: 0 10*3/uL (ref 0.0–0.1)
Basophils Relative: 0.5 % (ref 0.0–3.0)
Eosinophils Absolute: 0.2 10*3/uL (ref 0.0–0.7)
Eosinophils Relative: 4 % (ref 0.0–5.0)
HCT: 43.5 % (ref 36.0–46.0)
Hemoglobin: 14.4 g/dL (ref 12.0–15.0)
Lymphocytes Relative: 15.2 % (ref 12.0–46.0)
Lymphs Abs: 0.9 10*3/uL (ref 0.7–4.0)
MCHC: 33.2 g/dL (ref 30.0–36.0)
MCV: 91.7 fl (ref 78.0–100.0)
Monocytes Absolute: 0.6 10*3/uL (ref 0.1–1.0)
Monocytes Relative: 9.4 % (ref 3.0–12.0)
Neutro Abs: 4.4 10*3/uL (ref 1.4–7.7)
Neutrophils Relative %: 70.9 % (ref 43.0–77.0)
Platelets: 163 10*3/uL (ref 150.0–400.0)
RBC: 4.75 Mil/uL (ref 3.87–5.11)
RDW: 14.1 % (ref 11.5–15.5)
WBC: 6.2 10*3/uL (ref 4.0–10.5)

## 2020-06-13 LAB — COMPREHENSIVE METABOLIC PANEL
ALT: 14 U/L (ref 0–35)
AST: 17 U/L (ref 0–37)
Albumin: 4.1 g/dL (ref 3.5–5.2)
Alkaline Phosphatase: 71 U/L (ref 39–117)
BUN: 16 mg/dL (ref 6–23)
CO2: 33 mEq/L — ABNORMAL HIGH (ref 19–32)
Calcium: 8.1 mg/dL — ABNORMAL LOW (ref 8.4–10.5)
Chloride: 101 mEq/L (ref 96–112)
Creatinine, Ser: 0.84 mg/dL (ref 0.40–1.20)
GFR: 73.39 mL/min (ref >60.00)
Glucose, Bld: 76 mg/dL (ref 70–99)
Potassium: 4 mEq/L (ref 3.5–5.1)
Sodium: 143 mEq/L (ref 135–145)
Total Bilirubin: 0.6 mg/dL (ref 0.2–1.2)
Total Protein: 6.9 g/dL (ref 6.0–8.3)

## 2020-06-13 LAB — LIPID PANEL
Cholesterol: 179 mg/dL (ref 0–200)
HDL: 60.6 mg/dL (ref >39.00)
LDL Cholesterol: 102 mg/dL — ABNORMAL HIGH (ref 0–99)
NonHDL: 118.45
Total CHOL/HDL Ratio: 3
Triglycerides: 82 mg/dL (ref 0.0–149.0)
VLDL: 16.4 mg/dL (ref 0.0–40.0)

## 2020-06-13 LAB — TSH: TSH: 1.03 u[IU]/mL (ref 0.35–4.50)

## 2020-06-13 LAB — PROTIME-INR
INR: 1.1 ratio — ABNORMAL HIGH (ref 0.8–1.0)
Prothrombin Time: 12.2 s (ref 9.6–13.1)

## 2020-06-13 LAB — VITAMIN D 25 HYDROXY (VIT D DEFICIENCY, FRACTURES): VITD: 56.17 ng/mL (ref 30.00–100.00)

## 2020-06-13 NOTE — Patient Instructions (Addendum)
You may increase claritin use to 2 times daily for allergies   You can Increase use of ayr to several times daily   Begin your walking program.  Start at 50%.  Your goal is to lose 50 lbs this year.    Your weekend dinner could be  The following:  [employer] Choice low carb Power bowl   Lean Cuisine  Beef Merlot is very low carb   If your labs are normal  Consider a trial of wellbutrin to help your mood (it will not cause weight gain)

## 2020-06-13 NOTE — Progress Notes (Signed)
Subjective:  Patient ID: Lauren Bartlett, female    DOB: 03-18-1956  Age: 65 y.o. MRN: [id number]  CC: The primary encounter diagnosis was Epistaxis. Diagnoses of Obesity (BMI 30-39.9), Vitamin D deficiency, Hypothyroidism due to acquired atrophy of thyroid, Chronic diastolic congestive heart failure ([place]), Hypocalcemia, Chronic bronchitis, unspecified chronic bronchitis type ([place]), Recurrent epistaxis, and Primary hypertension were also pertinent to this visit.  HPI Lauren Bartlett presents for follow up on obesity and  Hypertension.  Several new issues   This visit occurred during the SARS-CoV-2 public health emergency.  Safety protocols were in place, including screening questions prior to the visit, additional usage of staff PPE, and extensive cleaning of exam room while observing appropriate contact time as indicated for disinfecting solutions.    Cc:  Low energy for the past month.    Depression screen:   Several family losses over th last 1-2 years.  Still grieiving, but denies feeling loss of optimism and has several  Has social events 2-3 /week .  Drives and has a car .  No trouble paying for meds.      2) Recurrent epistaxis.  2nd one required cautizeration  Dec 2021.   Has not used flonase in months.  Using AYR.  Had a minor bleed last week.  Uses afrin prn.  Uses humidifed oxygen at night via nasal cannula .  3) Obesity :  weight gain of 57 lbs since 2017,  Personal   Best  191   Not walking regularly yet. Diet reviewed    4) Patient is taking her medications as prescribed and notes no adverse effects.  Home BP readings have been done about once per week and are  generally < 130/80 .  She is avoiding added salt in her diet .    Outpatient Medications Prior to Visit  Medication Sig Dispense Refill  . acetaminophen (TYLENOL) 500 MG tablet Take 500 mg by mouth 2 (two) times daily as needed for moderate pain or headache.    . albuterol (VENTOLIN HFA) 108 (90 Base) MCG/ACT inhaler INHALE 2  PUFFS INTO THE LUNGS EVERY 6 HOURS AS NEEDED FOR WHEEZING OR SHORTNESS OF BREATH.  (USE WITH SPACER) 18 g 1  . aspirin EC 81 MG EC tablet Take 1 tablet (81 mg total) by mouth daily.    . Calcium Carb-Cholecalciferol 600-800 MG-UNIT TABS Take 1 tablet by mouth in the morning, at noon, and at bedtime.     . docusate sodium (COLACE) 100 MG capsule Take 100 mg by mouth daily as needed for mild constipation.    . famotidine (PEPCID AC) 10 MG chewable tablet Chew 20 mg by mouth daily as needed for heartburn.     . ferrous sulfate 325 (65 FE) MG tablet Take 325 mg by mouth daily with breakfast.    . FLOVENT HFA 110 MCG/ACT inhaler INHALE 2 PUFFS TWICE DAILY 36 g 1  . furosemide (LASIX) 20 MG tablet TAKE 1 TABLET EVERY DAY . MAY TAKE AN ADDITIONAL TABLET AS NEEDED FOR WEIGHT GAIN OF 2 LBS OVERNIGHT OR 5 LBS IN ONE WEEK 90 tablet 3  . levothyroxine (SYNTHROID) 150 MCG tablet TAKE 1 TABLET EVERY DAY BEFORE BREAKFAST (INCREASED DOSE) 90 tablet 1  . loratadine (CLARITIN) 10 MG tablet Take 10 mg by mouth daily.    [name] losartan (COZAAR) 50 MG tablet TAKE 1 TABLET EVERY DAY 90 tablet 1  . OXYGEN Inhale 2 L into the lungs at bedtime as needed (at bedtime  and as needed).    [name] oxymetazoline (AFRIN) 0.05 % nasal spray Place 1 spray into both nostrils 2 (two) times daily. As needed for nose bleed    . polyethylene glycol powder (GLYCOLAX/MIRALAX) powder Take 17 g by mouth 2 (two) times daily as needed. (Patient taking differently: Take 17 g by mouth daily as needed for mild constipation.) 3350 g 1  . Tiotropium Bromide-Olodaterol (STIOLTO RESPIMAT) 2.5-2.5 MCG/ACT AERS INHALE  2  PUFFS  INTO  THE  LUNGS EVERY DAY 12 g 1  . traMADol (ULTRAM) 50 MG tablet Take 1-2 tablets (50-100 mg total) by mouth every 4 (four) hours as needed for moderate pain. 30 tablet 0  . vitamin B-12 (CYANOCOBALAMIN) 1000 MCG tablet Take 1 tablet (1,000 mcg total) by mouth daily. 90 tablet 3  . albuterol (PROVENTIL) (2.5 MG/3ML) 0.083% nebulizer  solution INHALE THE CONTENTS OF 1 VIAL VIA NEBULIZER EVERY 6 HOURS AS NEEDED FOR WHEEZING OR FOR SHORTNESS OF BREATH 90 mL 1  . ergocalciferol (DRISDOL) 1.25 MG (50000 UT) capsule Take 1 capsule (50,000 Units total) by mouth once a week. 12 capsule 0  . fluticasone (FLONASE) 50 MCG/ACT nasal spray USE 2 SPRAYS IN EACH NOSTRIL EVERY DAY (Patient taking differently: as needed.) 48 g 2   No facility-administered medications prior to visit.    Review of Systems;  Patient denies headache, fevers, malaise, unintentional weight loss, skin rash, eye pain, sinus congestion and sinus pain, sore throat, dysphagia,  hemoptysis , cough, dyspnea, wheezing, chest pain, palpitations, orthopnea, edema, abdominal pain, nausea, melena, diarrhea, constipation, flank pain, dysuria, hematuria, urinary  Frequency, nocturia, numbness, tingling, seizures,  Focal weakness, Loss of consciousness,  Tremor, insomnia, depression, anxiety, and suicidal ideation.      Objective:  BP 128/86 (BP Location: Left Arm, Patient Position: Sitting, Cuff Size: Large)   Pulse 85   Temp 97.9 F (36.6 C) (Oral)   Resp 16   Ht 5\' 2"  (1.575 m)   Wt 247 lb 3.2 oz (112.1 kg)   SpO2 93%   BMI 45.21 kg/m   BP Readings from Last 3 Encounters:  06/13/20 128/86  04/09/20 127/80  03/02/20 (!) 153/93    Wt Readings from Last 3 Encounters:  06/13/20 247 lb 3.2 oz (112.1 kg)  04/09/20 247 lb 6 oz (112.2 kg)  03/02/20 248 lb 7.7 oz (112.7 kg)    General appearance: alert, cooperative and appears stated age Ears: normal TM's and external ear canals both ears Throat: lips, mucosa, and tongue normal; teeth and gums normal Neck: no adenopathy, no carotid bruit, supple, symmetrical, trachea midline and thyroid not enlarged, symmetric, no tenderness/mass/nodules Back: symmetric, no curvature. ROM normal. No CVA tenderness. Lungs: clear to auscultation bilaterally Heart: regular rate and rhythm, S1, S2 normal, no murmur, click, rub or  gallop Abdomen: soft, non-tender; bowel sounds normal; no masses,  no organomegaly Pulses: 2+ and symmetric Skin: Skin color, texture, turgor normal. No rashes or lesions Lymph nodes: Cervical, supraclavicular, and axillary nodes normal.  Lab Results  Component Value Date   HGBA1C 5.5 01/26/2019   HGBA1C 5.3 07/30/2017   HGBA1C 5.1 07/14/2016    Lab Results  Component Value Date   CREATININE 0.84 06/13/2020   CREATININE 0.87 11/02/2019   CREATININE 0.89 06/20/2019    Lab Results  Component Value Date   WBC 6.2 06/13/2020   HGB 14.4 06/13/2020   HCT 43.5 06/13/2020   PLT 163.0 06/13/2020   GLUCOSE 76 06/13/2020   CHOL 179 06/13/2020  TRIG 82.0 06/13/2020   HDL 60.60 06/13/2020   LDLDIRECT 101.0 07/30/2017   LDLCALC 102 (H) 06/13/2020   ALT 14 06/13/2020   AST 17 06/13/2020   NA 143 06/13/2020   K 4.0 06/13/2020   CL 101 06/13/2020   CREATININE 0.84 06/13/2020   BUN 16 06/13/2020   CO2 33 (H) 06/13/2020   TSH 1.03 06/13/2020   INR 1.1 (H) 06/13/2020   HGBA1C 5.5 01/26/2019    No results found.  Assessment & Plan:   Problem List Items Addressed This Visit      Unprioritized   Chronic diastolic congestive heart failure (HCC) (Chronic)    Managed with daily use of  lasix and losartan and prn extra doses for wt gain of 2 lb  overnight. .  Currently taking 20 mg lasix daily  Lab Results  Component Value Date   CREATININE 0.84 06/13/2020        COPD (chronic obstructive pulmonary disease) (HCC) (Chronic)    She denies any recent asthma exacerbations  Continue current regimen and  follow up with pulmonary as advised.       Hypertension (Chronic)    Well controlled on current regimen of losartan 50 mg daily  Renal function stable, no changes today.  Lab Results  Component Value Date   CREATININE 0.84 06/13/2020   Lab Results  Component Value Date   NA 143 06/13/2020   K 4.0 06/13/2020   CL 101 06/13/2020   CO2 33 (H) 06/13/2020          Hypocalcemia    Chronic,  Presumed due to primary hypoparathyroidism per Dr Sherren Mocha evaluation Sept 2021. she will need to take calcium and vitamin D for the rest of her life. Her magnesium has been normal. As long as she is 8.0 or higher, we do not need to change anything with her regimen. If it falls despite her regimen ,   we could add calcitriol to her.  Lab Results  Component Value Date   CALCIUM 8.1 (L) 06/13/2020   PHOS 4.2 06/11/2019        Hypothyroidism (Chronic)    Managed with levothyroxine 150 mcg  Lab Results  Component Value Date   TSH 1.03 06/13/2020         Obesity (BMI 30-39.9) (Chronic)    Reviewed her 57 lb weight gain since 2017,  Caused largely by inactivity and use of Meals on Wheels .  Diet reviewed.  Low cost prepared meals from [employer] recommended as alternatives to sandwiches, canned soup and fast food. Screening for thyroid diabetes done  Lab Results  Component Value Date   TSH 1.03 06/13/2020          Recurrent epistaxis    Secondary to dry nasal passages aggravated by nocturnal oxygen use and allergic rhinitis managed with claritin and flonase.  Increase use of ayr gel .  CBC and INR are normal  Lab Results  Component Value Date   INR 1.1 (H) 06/13/2020   INR 1.4 08/26/2016   INR 1.5 08/18/2016   Lab Results  Component Value Date   WBC 6.2 06/13/2020   HGB 14.4 06/13/2020   HCT 43.5 06/13/2020   MCV 91.7 06/13/2020   PLT 163.0 06/13/2020         Vitamin D deficiency (Chronic)    She will need to continue Vitamin D supplementation for life to maintain a calcium level above 8.0  Last vitamin D Lab Results  Component Value Date  VD25OH 56.17 06/13/2020          Other Visit Diagnoses    Epistaxis    -  Primary   Relevant Orders   CBC with Differential/Platelet (Completed)   Protime-INR (Completed)      I have discontinued Jocelyn Lamer B. Fyfe's fluticasone and ergocalciferol. I am also having her maintain her  polyethylene glycol powder, OXYGEN, famotidine, acetaminophen, aspirin, ferrous sulfate, vitamin B-12, traMADol, oxymetazoline, docusate sodium, loratadine, Calcium Carb-Cholecalciferol, Flovent HFA, Stiolto Respimat, furosemide, losartan, albuterol, and levothyroxine.  No orders of the defined types were placed in this encounter.   Medications Discontinued During This Encounter  Medication Reason  . fluticasone (FLONASE) 50 MCG/ACT nasal spray   . ergocalciferol (DRISDOL) 1.25 MG (50000 UT) capsule     Follow-up: Return in about 3 months (around 09/13/2020).   [name], MD

## 2020-06-14 LAB — CALCIUM, IONIZED: Calcium, Ion: 4.36 mg/dL — ABNORMAL LOW (ref 4.8–5.6)

## 2020-06-16 DIAGNOSIS — R040 Epistaxis: Secondary | ICD-10-CM | POA: Insufficient documentation

## 2020-06-16 HISTORY — DX: Epistaxis: R04.0

## 2020-06-16 NOTE — Assessment & Plan Note (Addendum)
Chronic,  Presumed due to primary hypoparathyroidism per Dr Sherren Mocha evaluation Sept 2021. she will need to take calcium and vitamin D for the rest of her life. Her magnesium has been normal. As long as she is 8.0 or higher, we do not need to change anything with her regimen. If it falls despite her regimen ,   we could add calcitriol to her.  Lab Results  Component Value Date   CALCIUM 8.1 (L) 06/13/2020   PHOS 4.2 06/11/2019

## 2020-06-16 NOTE — Assessment & Plan Note (Signed)
She will need to continue Vitamin D supplementation for life to maintain a calcium level above 8.0  Last vitamin D Lab Results  Component Value Date   VD25OH 56.17 06/13/2020

## 2020-06-16 NOTE — Assessment & Plan Note (Signed)
Reviewed her 67 lb weight gain since 2017,  Caused largely by inactivity and use of Meals on Wheels .  Diet reviewed.  Low cost prepared meals from [employer] recommended as alternatives to sandwiches, canned soup and fast food. Screening for thyroid diabetes done  Lab Results  Component Value Date   TSH 1.03 06/13/2020

## 2020-06-16 NOTE — Assessment & Plan Note (Signed)
Managed with daily use of  lasix and losartan and prn extra doses for wt gain of 2 lb  overnight. .  Currently taking 20 mg lasix daily  Lab Results  Component Value Date   CREATININE 0.84 06/13/2020

## 2020-06-16 NOTE — Assessment & Plan Note (Signed)
Managed with levothyroxine 150 mcg  Lab Results  Component Value Date   TSH 1.03 06/13/2020

## 2020-06-16 NOTE — Assessment & Plan Note (Signed)
She denies any recent asthma exacerbations  Continue current regimen and  follow up with pulmonary as advised.

## 2020-06-16 NOTE — Assessment & Plan Note (Signed)
Well controlled on current regimen of losartan 50 mg daily  Renal function stable, no changes today.  Lab Results  Component Value Date   CREATININE 0.84 06/13/2020   Lab Results  Component Value Date   NA 143 06/13/2020   K 4.0 06/13/2020   CL 101 06/13/2020   CO2 33 (H) 06/13/2020

## 2020-06-16 NOTE — Assessment & Plan Note (Signed)
Secondary to dry nasal passages aggravated by nocturnal oxygen use and allergic rhinitis managed with claritin and flonase.  Increase use of ayr gel .  CBC and INR are normal  Lab Results  Component Value Date   INR 1.1 (H) 06/13/2020   INR 1.4 08/26/2016   INR 1.5 08/18/2016   Lab Results  Component Value Date   WBC 6.2 06/13/2020   HGB 14.4 06/13/2020   HCT 43.5 06/13/2020   MCV 91.7 06/13/2020   PLT 163.0 06/13/2020

## 2020-06-29 ENCOUNTER — Other Ambulatory Visit: Payer: Self-pay | Admitting: Internal Medicine

## 2020-07-04 ENCOUNTER — Telehealth: Payer: Self-pay | Admitting: Internal Medicine

## 2020-07-04 NOTE — Telephone Encounter (Signed)
Patient called and is confused on what medication was called in for her. It looks like she may be talking about the conversation she had with Fransisco Beau when he called her about her lab results.

## 2020-07-06 DIAGNOSIS — J449 Chronic obstructive pulmonary disease, unspecified: Secondary | ICD-10-CM | POA: Diagnosis not present

## 2020-07-06 MED ORDER — ERGOCALCIFEROL 1.25 MG (50000 UT) PO CAPS: 50000.0000 [IU] | ORAL_CAPSULE | ORAL | 0 refills | Status: DC

## 2020-07-06 NOTE — Telephone Encounter (Signed)
Called pt and she stated that she was confused when the gentleman called her the other day about her lab work and the medications he was talking about. I reread the message to the pt and she stated that she is taking the megadose of vitamin d once weekly and taking an OTC vitamin d supplement daily.

## 2020-07-06 NOTE — Telephone Encounter (Signed)
Medication has been refilled. Pt is aware that she should continue taking the megadose and the OTC supplement.

## 2020-07-06 NOTE — Telephone Encounter (Signed)
She should keep doing whatever regimen she is doing.  Ai didn't seen the megadose on her medication list; that's why I wanted Fransisco Beau to ask if she was taking it. She will need a refill unless another doctor is doing that refill for her

## 2020-07-11 DIAGNOSIS — J449 Chronic obstructive pulmonary disease, unspecified: Secondary | ICD-10-CM | POA: Diagnosis not present

## 2020-07-17 ENCOUNTER — Other Ambulatory Visit: Payer: Self-pay | Admitting: Internal Medicine

## 2020-07-23 ENCOUNTER — Other Ambulatory Visit: Payer: Self-pay | Admitting: Internal Medicine

## 2020-07-24 ENCOUNTER — Encounter: Payer: Self-pay | Admitting: Pulmonary Disease

## 2020-07-24 ENCOUNTER — Other Ambulatory Visit: Payer: Self-pay

## 2020-07-24 ENCOUNTER — Ambulatory Visit (INDEPENDENT_AMBULATORY_CARE_PROVIDER_SITE_OTHER): Payer: Medicare HMO

## 2020-07-24 DIAGNOSIS — Z9889 Other specified postprocedural states: Secondary | ICD-10-CM

## 2020-07-24 DIAGNOSIS — I050 Rheumatic mitral stenosis: Secondary | ICD-10-CM | POA: Diagnosis not present

## 2020-07-24 DIAGNOSIS — Z954 Presence of other heart-valve replacement: Secondary | ICD-10-CM | POA: Diagnosis not present

## 2020-07-24 LAB — ECHOCARDIOGRAM COMPLETE
AR max vel: 3.09 cm2
AV Area VTI: 3.32 cm2
AV Area mean vel: 2.83 cm2
AV Mean grad: 3 mmHg
AV Peak grad: 5.5 mmHg
Ao pk vel: 1.17 m/s
Area-P 1/2: 2.74 cm2
Calc EF: 53.1 %
MV VTI: 1.28 cm2
Single Plane A2C EF: 53.1 %
Single Plane A4C EF: 54.2 %

## 2020-07-25 ENCOUNTER — Telehealth: Payer: Self-pay | Admitting: *Deleted

## 2020-07-25 NOTE — Telephone Encounter (Signed)
Reviewed results with patient and she verbalized understanding with no further questions at this time.  

## 2020-07-25 NOTE — Telephone Encounter (Signed)
-----   Message from Arvil Chaco, PA-C sent at 07/25/2020 10:45 AM EDT ----- Echo shows relatively stable findings from 2020 echo: --Normal heart squeeze or pump function. --Mildly elevated pressures coming from the lungs as seen in the past, but with right heart pressures slightly improved from 2020 --Mild enlargement of the top left chamber of the heart, which is new, and which we can monitor. --Moderate stiffness of the mitral valve with evidence of the repair with mean gradient unchanged from 2020.

## 2020-07-25 NOTE — Telephone Encounter (Signed)
Left voicemail message for patient to call back for review of results  

## 2020-08-02 ENCOUNTER — Other Ambulatory Visit: Payer: Self-pay | Admitting: Internal Medicine

## 2020-08-05 DIAGNOSIS — J449 Chronic obstructive pulmonary disease, unspecified: Secondary | ICD-10-CM | POA: Diagnosis not present

## 2020-08-10 DIAGNOSIS — J449 Chronic obstructive pulmonary disease, unspecified: Secondary | ICD-10-CM | POA: Diagnosis not present

## 2020-08-14 ENCOUNTER — Other Ambulatory Visit: Payer: Self-pay | Admitting: Internal Medicine

## 2020-08-17 ENCOUNTER — Telehealth: Payer: Self-pay | Admitting: Family

## 2020-08-17 NOTE — Telephone Encounter (Signed)
Virtual Visit Pre-Appointment Phone Call  "Ms Wyffels, I am calling you today to discuss your upcoming appointment. We are currently trying to limit exposure to the virus that causes COVID-19 by seeing patients at home rather than in the office."  1. "What is the BEST phone number to call the day of the visit?" - include this in appointment notes  2. "Do you have or have access to (through a family member/friend) a smartphone with video capability that we can use for your visit?" a. If yes - list this number in appt notes as "cell" (if different from BEST phone #) and list the appointment type as a VIDEO visit in appointment notes b. If no - list the appointment type as a PHONE visit in appointment notes  3. Confirm consent - "In the setting of the current Covid19 crisis, you are scheduled for a (phone or video) visit with your provider on (date) at (time).  Just as we do with many in-office visits, in order for you to participate in this visit, we must obtain consent.  If you'd like, I can send this to your mychart (if signed up) or email for you to review.  Otherwise, I can obtain your verbal consent now.  All virtual visits are billed to your insurance company just like a normal visit would be.  By agreeing to a virtual visit, we'd like you to understand that the technology does not allow for your provider to perform an examination, and thus may limit your provider's ability to fully assess your condition. If your provider identifies any concerns that need to be evaluated in person, we will make arrangements to do so.  Finally, though the technology is pretty good, we cannot assure that it will always work on either your or our end, and in the setting of a video visit, we may have to convert it to a phone-only visit.  In either situation, we cannot ensure that we have a secure connection.  Are you willing to proceed?" STAFF: Did the patient verbally acknowledge consent to telehealth visit? Document  YES/NO here: yes  4. Advise patient to be prepared - "Two hours prior to your appointment, go ahead and check your blood pressure, pulse, oxygen saturation, and your weight (if you have the equipment to check those) and write them all down. When your visit starts, your provider will ask you for this information. If you have an Apple Watch or Kardia device, please plan to have heart rate information ready on the day of your appointment. Please have a pen and paper handy nearby the day of the visit as well."  5. Give patient instructions for MyChart download to smartphone OR Doximity/Doxy.me as below if video visit (depending on what platform provider is using)  6. Inform patient they will receive a phone call 15 minutes prior to their appointment time (may be from unknown caller ID) so they should be prepared to answer    TELEPHONE CALL NOTE  LOLETHA BERTINI has been deemed a candidate for a follow-up tele-health visit to limit community exposure during the Covid-19 pandemic. I spoke with the patient via phone to ensure availability of phone/video source, confirm preferred email & phone number, and discuss instructions and expectations.  I reminded Lauren Bartlett to be prepared with any vital sign and/or heart rhythm information that could potentially be obtained via home monitoring, at the time of her visit. I reminded Lauren Bartlett to expect a phone call prior to  her visit.  Alisa Graff, [place] 08/17/2020 11:01 AM   INSTRUCTIONS FOR DOWNLOADING THE MYCHART APP TO SMARTPHONE  - The patient must first make sure to have activated MyChart and know their login information - If Apple, go to [employer] and type in MyChart in the search bar and download the app. If Android, ask patient to go to [employer] and type in [place] in the search bar and download the app. The app is free but as with any other app downloads, their phone may require them to verify saved payment information or Apple/Android  password.  - The patient will need to then log into the app with their MyChart username and password, and select [facility] as their healthcare provider to link the account. When it is time for your visit, go to the MyChart app, find appointments, and click Begin Video Visit. Be sure to Select Allow for your device to access the Microphone and Camera for your visit. You will then be connected, and your provider will be with you shortly.  **If they have any issues connecting, or need assistance please contact MyChart service desk [phone])**  **If using a computer, in order to ensure the best quality for their visit they will need to use either of the following Internet Browsers: [employer], or Google Chrome**  IF USING DOXIMITY or DOXY.ME - The patient will receive a link just prior to their visit by text.     FULL LENGTH CONSENT FOR TELE-HEALTH VISIT   I hereby voluntarily request, consent and authorize [facility] and its employed or contracted physicians, physician assistants, nurse practitioners or other licensed health care professionals (the Practitioner), to provide me with telemedicine health care services (the "Services") as deemed necessary by the treating Practitioner. I acknowledge and consent to receive the Services by the Practitioner via telemedicine. I understand that the telemedicine visit will involve communicating with the Practitioner through live audiovisual communication technology and the disclosure of certain medical information by electronic transmission. I acknowledge that I have been given the opportunity to request an in-person assessment or other available alternative prior to the telemedicine visit and am voluntarily participating in the telemedicine visit.  I understand that I have the right to withhold or withdraw my consent to the use of telemedicine in the course of my care at any time, without affecting my right to future care or treatment, and  that the Practitioner or I may terminate the telemedicine visit at any time. I understand that I have the right to inspect all information obtained and/or recorded in the course of the telemedicine visit and may receive copies of available information for a reasonable fee.  I understand that some of the potential risks of receiving the Services via telemedicine include:  [name] Delay or interruption in medical evaluation due to technological equipment failure or disruption; . Information transmitted may not be sufficient (e.g. poor resolution of images) to allow for appropriate medical decision making by the Practitioner; and/or  . In rare instances, security protocols could fail, causing a breach of personal health information.  Furthermore, I acknowledge that it is my responsibility to provide information about my medical history, conditions and care that is complete and accurate to the best of my ability. I acknowledge that Practitioner's advice, recommendations, and/or decision may be based on factors not within their control, such as incomplete or inaccurate data provided by me or distortions of diagnostic images or specimens that may result from electronic transmissions.  I understand that the practice of medicine is not an exact science and that Practitioner makes no warranties or guarantees regarding treatment outcomes. I acknowledge that I will receive a copy of this consent concurrently upon execution via email to the email address I last provided but may also request a printed copy by calling the office of [facility].    I understand that my insurance will be billed for this visit.   I have read or had this consent read to me. . I understand the contents of this consent, which adequately explains the benefits and risks of the Services being provided via telemedicine.  . I have been provided ample opportunity to ask questions regarding this consent and the Services and have had my  questions answered to my satisfaction. . I give my informed consent for the services to be provided through the use of telemedicine in my medical care  By participating in this telemedicine visit I agree to the above.

## 2020-08-20 ENCOUNTER — Encounter: Payer: Self-pay | Admitting: Family

## 2020-08-20 ENCOUNTER — Other Ambulatory Visit: Payer: Self-pay

## 2020-08-20 ENCOUNTER — Ambulatory Visit: Payer: Medicare HMO | Attending: Family | Admitting: Family

## 2020-08-20 VITALS — Wt 247.5 lb

## 2020-08-20 DIAGNOSIS — I10 Essential (primary) hypertension: Secondary | ICD-10-CM

## 2020-08-20 DIAGNOSIS — J449 Chronic obstructive pulmonary disease, unspecified: Secondary | ICD-10-CM

## 2020-08-20 DIAGNOSIS — I5032 Chronic diastolic (congestive) heart failure: Secondary | ICD-10-CM

## 2020-08-20 NOTE — Patient Instructions (Signed)
Continue weighing daily and call for an overnight weight gain of > 2 pounds or a weekly weight gain of >5 pounds. 

## 2020-08-20 NOTE — Progress Notes (Signed)
Virtual Visit via Telephone Note   Evaluation Performed:  Follow-up visit  This visit type was conducted due to national recommendations for restrictions regarding the COVID-19 Pandemic (e.g. social distancing).  This format is felt to be most appropriate for this patient at this time.  All issues noted in this document were discussed and addressed.  No physical exam was performed (except for noted visual exam findings with Video Visits).  Please refer to the patient's chart (MyChart message for video visits and phone note for telephone visits) for the patient's consent to telehealth for [facility]  Date:  08/20/2020   ID:  [name], DOB 1955/09/07, MRN [id number]  Patient Location:  [place] [address]   Provider location:   [facility] [address], [place] [address]  PCP:  [name], MD  Cardiologist:  Kathlyn Sacramento, MD  Electrophysiologist:  None   Chief Complaint:  Shortness of breath  History of Present Illness:    Lauren Bartlett is a 65 y.o. female who presents via audio/video conferencing for a telehealth visit today.  Patient verified DOB and address.  The patient does not have symptoms concerning for COVID-19 infection (fever, chills, cough, or new SHORTNESS OF BREATH). She has minimal shortness of breath upon moderate exertion. She describes this as being chronic in nature having been present for several years. She has associated fatigue, dry cough and light-headedness with sudden position changes along with this. She denies any pedal/ abdominal swelling, palpitations, chest pain, difficulty sleeping or weight gain.   Continues to wear her oxygen at 2L at bedtime.   Prior CV studies:   The following studies were reviewed today:  Echo report from 07/24/20 reviewed and showed an EF of 55-60% along with mildly elevated PA pressure, mild LAE and moderate MS.   Past Medical History:   Diagnosis Date  . Anxiety   . Bleeding from the nose   . Chronic diastolic congestive heart failure ([place])    a. 03/2016 Echo: EF 55-60%, Gr2 DD; b. 08/2016 Echo: EF 60-65%, nl diast fxn; c. 01/2019 Echo: EF 55-60%. Nl RV fxn.  . Congenital absence of one kidney   . COPD (chronic obstructive pulmonary disease) ([place])    a. 02/2017 PFT: mod/sev obstructive airway dzs w/ significant bronchodilator response.  . Depression    treated at [facility]  . Dyspnea   . Emphysema/COPD ([place])   . GERD (gastroesophageal reflux disease)   . History of cardiac cath    a. 05/2016 Cath: nl cors.  . History of sciatica   . Hypertension   . Hypothyroidism   . Mitral Valve Prolapse & Severe mitral regurgitation s/p MVR    a. 03/2016 Echo: severe MVP involving the posterior leaflet, sev MR;  b. 06/2016 min invasive MV repair w/ triangular resection of flail segment (P2) of posterior leaflet, gore-tex neochord placement x 4, Sorin Memo 3D rin Annulosplasty (66mm, catalog # [id number], ser # [id number]); c. 08/2016 Echo: MV area 2.06 cm^2 (pressure 1/2 time); d. 01/2019 Echo: Mild MS w/ mean grad 23mmHg.  . Motion sickness    cars  . Pneumonia   . PONV (postoperative nausea and vomiting)   . Post-op Afib    a. 06/2016 following MV repair-->short-course amio. Coumadin d/c'd 2/2 epistaxis.  . Pulmonary hypertension ([place])    a. 08/2016 Echo: PASP 7mmHg; b. 01/2019 Echo: RVSP 45.22mmHg.  . S/P patent foramen ovale  closure    a. 06/2016 @ time of MV Repair.  . Sleep apnea    O2 at night and PRN   Past Surgical History:  Procedure Laterality Date  . ABDOMINAL HYSTERECTOMY    . BREAST BIOPSY Right 2011   [facility]< benign  . CATARACT EXTRACTION W/PHACO Left 11/14/2015   Procedure: CATARACT EXTRACTION PHACO AND INTRAOCULAR LENS PLACEMENT (IOC);  Surgeon: Leandrew Koyanagi, MD;  Location: [facility];  Service: Ophthalmology;  Laterality: Left;  sleep apnea [place]  . CATARACT EXTRACTION W/PHACO Right 12/19/2015    Procedure: CATARACT EXTRACTION PHACO AND INTRAOCULAR LENS PLACEMENT (IOC);  Surgeon: Leandrew Koyanagi, MD;  Location: [facility];  Service: Ophthalmology;  Laterality: Right;  ANXIETY GENEROUS IV SEDATION TORIC LEN  . COMBINED HYSTERECTOMY ABDOMINAL W/ A&P REPAIR / OOPHORECTOMY  1996   benign tumor  . INNER EAR SURGERY     bilateral  . MITRAL VALVE REPAIR Right 07/17/2016   Procedure: MINIMALLY INVASIVE MITRAL VALVE REPAIR (MVR) USING 26 SORIN MEMO 3D ANNULOPLASTY RING;  Surgeon: Rexene Alberts, MD;  Location: [facility];  Service: Open Heart Surgery;  Laterality: Right;  . PATENT FORAMEN OVALE(PFO) CLOSURE N/A 07/17/2016   Procedure: PATENT FORAMEN OVALE (PFO) CLOSURE;  Surgeon: Rexene Alberts, MD;  Location: [facility];  Service: Open Heart Surgery;  Laterality: N/A;  . RIGHT/LEFT HEART CATH AND CORONARY ANGIOGRAPHY Bilateral 06/16/2016   Procedure: Right/Left Heart Cath and Coronary Angiography;  Surgeon: Wellington Hampshire, MD;  Location: [facility];  Service: Cardiovascular;  Laterality: Bilateral;  . TEE WITHOUT CARDIOVERSION N/A 05/19/2016   Procedure: TRANSESOPHAGEAL ECHOCARDIOGRAM (TEE);  Surgeon: Wellington Hampshire, MD;  Location: [facility];  Service: Cardiovascular;  Laterality: N/A;  . TEE WITHOUT CARDIOVERSION N/A 06/09/2016   Procedure: TRANSESOPHAGEAL ECHOCARDIOGRAM (TEE);  Surgeon: Wellington Hampshire, MD;  Location: [facility];  Service: Cardiovascular;  Laterality: N/A;  . TEE WITHOUT CARDIOVERSION N/A 06/16/2016   Procedure: TRANSESOPHAGEAL ECHOCARDIOGRAM (TEE);  Surgeon: Wellington Hampshire, MD;  Location: [facility];  Service: Cardiovascular;  Laterality: N/A;  . TEE WITHOUT CARDIOVERSION N/A 07/17/2016   Procedure: TRANSESOPHAGEAL ECHOCARDIOGRAM (TEE);  Surgeon: Rexene Alberts, MD;  Location: [facility];  Service: Open Heart Surgery;  Laterality: N/A;  . TONSILLECTOMY       Current Meds  Medication Sig  . acetaminophen (TYLENOL) 500 MG tablet Take 500 mg by mouth 2 (two) times daily  as needed for moderate pain or headache.  . albuterol (PROVENTIL) (2.5 MG/3ML) 0.083% nebulizer solution INHALE THE CONTENTS OF 1 VIAL VIA NEBULIZER EVERY 6 HOURS AS NEEDED FOR WHEEZING  OR FOR SHORTNESS OF BREATH  . albuterol (VENTOLIN HFA) 108 (90 Base) MCG/ACT inhaler INHALE 2 PUFFS INTO THE LUNGS EVERY 6 HOURS AS NEEDED FOR WHEEZING OR SHORTNESS OF BREATH (USE WITH SPACER)  . aspirin EC 81 MG EC tablet Take 1 tablet (81 mg total) by mouth daily.  . Calcium Carb-Cholecalciferol 600-800 MG-UNIT TABS Take 1 tablet by mouth in the morning, at noon, and at bedtime.   . docusate sodium (COLACE) 100 MG capsule Take 100 mg by mouth daily as needed for mild constipation.  . ergocalciferol (DRISDOL) 1.25 MG (50000 UT) capsule Take 1 capsule (50,000 Units total) by mouth once a week.  . famotidine (PEPCID AC) 10 MG chewable tablet Chew 20 mg by mouth daily as needed for heartburn.   . ferrous sulfate 325 (65 FE) MG tablet Take 325 mg by mouth daily with breakfast.  . FLOVENT HFA  110 MCG/ACT inhaler INHALE 2 PUFFS TWICE DAILY  . furosemide (LASIX) 20 MG tablet TAKE 1 TABLET EVERY DAY . MAY TAKE AN ADDITIONAL TABLET AS NEEDED FOR WEIGHT GAIN OF 2 LBS OVERNIGHT OR 5 LBS IN ONE WEEK  . levothyroxine (SYNTHROID) 150 MCG tablet TAKE 1 TABLET EVERY DAY BEFORE BREAKFAST (INCREASED DOSE)  . loratadine (CLARITIN) 10 MG tablet Take 10 mg by mouth daily.  [name] losartan (COZAAR) 50 MG tablet TAKE 1 TABLET EVERY DAY  . OXYGEN Inhale 2 L into the lungs at bedtime as needed (at bedtime and as needed).  [name] oxymetazoline (AFRIN) 0.05 % nasal spray Place 1 spray into both nostrils 2 (two) times daily. As needed for nose bleed  . polyethylene glycol powder (GLYCOLAX/MIRALAX) powder Take 17 g by mouth 2 (two) times daily as needed. (Patient taking differently: Take 17 g by mouth daily as needed for mild constipation.)  . STIOLTO RESPIMAT 2.5-2.5 MCG/ACT AERS INHALE  2  PUFFS  INTO  THE  LUNGS EVERY DAY  . traMADol (ULTRAM) 50 MG  tablet Take 1-2 tablets (50-100 mg total) by mouth every 4 (four) hours as needed for moderate pain.  . vitamin B-12 (CYANOCOBALAMIN) 1000 MCG tablet Take 1 tablet (1,000 mcg total) by mouth daily.     Allergies:   Codeine, Levaquin [levofloxacin], Penicillins, Zithromax [azithromycin], and Prednisone   Social History   Tobacco Use  . Smoking status: Never Smoker  . Smokeless tobacco: Never Used  Vaping Use  . Vaping Use: Never used  Substance Use Topics  . Alcohol use: No  . Drug use: No     Family Hx: The patient's family history includes Breast cancer in her paternal aunt; Cancer in her brother; Coronary artery disease in her father; Heart disease in her brother and father; Hypertension in her father and mother; Multiple sclerosis in her mother.  ROS:   Please see the history of present illness.     All other systems reviewed and are negative.   Labs/Other Tests and Data Reviewed:    Recent Labs: 11/02/2019: Magnesium 1.9 06/13/2020: ALT 14; BUN 16; Creatinine, Ser 0.84; Hemoglobin 14.4; Platelets 163.0; Potassium 4.0; Sodium 143; TSH 1.03   Recent Lipid Panel Lab Results  Component Value Date/Time   CHOL 179 06/13/2020 11:06 AM   TRIG 82.0 06/13/2020 11:06 AM   HDL 60.60 06/13/2020 11:06 AM   CHOLHDL 3 06/13/2020 11:06 AM   LDLCALC 102 (H) 06/13/2020 11:06 AM   LDLDIRECT 101.0 07/30/2017 10:07 AM    Wt Readings from Last 3 Encounters:  08/20/20 247 lb 8 oz (112.3 kg)  06/13/20 247 lb 3.2 oz (112.1 kg)  04/09/20 247 lb 6 oz (112.2 kg)     Exam:    Vital Signs:  Wt 247 lb 8 oz (112.3 kg) Comment: self-reported  BMI 45.27 kg/m    Well nourished, well developed female in no  acute distress.   ASSESSMENT & PLAN:    1. Chronic HF with preserved ejection fraction- - NYHA class II - euvolemic today per patient's report - weighing daily and reminded to call for an overnight weight gain of >2 pounds or a weekly weight gain of >5 pounds - patient says that her  weight has been stable - not adding salt and has been reading food labels for sodium content; says that she doesn't eat fast food but does receive meals on wheels - understands that she needs to keep daily sodium content to 2000mg  sodium / day - had  tele visit with cardiology Mickle Plumb) 04/09/20 - BNP 06/09/19 was 65.0  2: HTN- - says that her BP was good yesterday when she checked it - saw PCP Derrel Nip) 06/13/20 - BMP 06/13/20 reviewed and showed sodium 143, potassium 4.0, creatinine 0.84 and GFR 73.39  3: COPD-  - saw pulmonology Patsey Berthold) 05/23/20 - wearing oxygen at 2L at bedtime    COVID-19 Education: The signs and symptoms of COVID-19 were discussed with the patient and how to seek care for testing (follow up with PCP or arrange E-visit).  The importance of social distancing was discussed today.  Patient Risk:   After full review of this patients clinical status, I feel that they are at least moderate risk at this time.  Time:   Today, I have spent 8 minutes with the patient with telehealth technology discussing symptoms, medications and weight gain.     Medication Adjustments/Labs and Tests Ordered: Current medicines are reviewed at length with the patient today.  Concerns regarding medicines are outlined above.   Tests Ordered: No orders of the defined types were placed in this encounter.  Medication Changes: No orders of the defined types were placed in this encounter.   Disposition:  Due to HF stability, will not make a return appointment for patient at this time. Advised patient that she could call back at anytime for any questions or to schedule another appointment and she was comfortable with this plan.   Signed, Alisa Graff, FNP  08/20/2020 10:33 AM    [facility]

## 2020-08-28 ENCOUNTER — Other Ambulatory Visit: Payer: Self-pay

## 2020-08-28 DIAGNOSIS — J449 Chronic obstructive pulmonary disease, unspecified: Secondary | ICD-10-CM

## 2020-08-28 MED ORDER — FUROSEMIDE 20 MG PO TABS: ORAL_TABLET | ORAL | 0 refills | Status: DC

## 2020-09-05 ENCOUNTER — Other Ambulatory Visit: Payer: Self-pay | Admitting: Internal Medicine

## 2020-09-05 DIAGNOSIS — J449 Chronic obstructive pulmonary disease, unspecified: Secondary | ICD-10-CM | POA: Diagnosis not present

## 2020-09-10 DIAGNOSIS — J449 Chronic obstructive pulmonary disease, unspecified: Secondary | ICD-10-CM | POA: Diagnosis not present

## 2020-09-14 ENCOUNTER — Telehealth (INDEPENDENT_AMBULATORY_CARE_PROVIDER_SITE_OTHER): Payer: Medicare HMO | Admitting: Internal Medicine

## 2020-09-14 ENCOUNTER — Encounter: Payer: Self-pay | Admitting: Internal Medicine

## 2020-09-14 DIAGNOSIS — F4329 Adjustment disorder with other symptoms: Secondary | ICD-10-CM | POA: Diagnosis not present

## 2020-09-14 DIAGNOSIS — G4736 Sleep related hypoventilation in conditions classified elsewhere: Secondary | ICD-10-CM | POA: Diagnosis not present

## 2020-09-14 DIAGNOSIS — F4381 Prolonged grief disorder: Secondary | ICD-10-CM

## 2020-09-14 DIAGNOSIS — I5032 Chronic diastolic (congestive) heart failure: Secondary | ICD-10-CM

## 2020-09-14 DIAGNOSIS — I10 Essential (primary) hypertension: Secondary | ICD-10-CM | POA: Diagnosis not present

## 2020-09-14 DIAGNOSIS — I700 Atherosclerosis of aorta: Secondary | ICD-10-CM | POA: Diagnosis not present

## 2020-09-14 DIAGNOSIS — E669 Obesity, unspecified: Secondary | ICD-10-CM | POA: Diagnosis not present

## 2020-09-14 DIAGNOSIS — J449 Chronic obstructive pulmonary disease, unspecified: Secondary | ICD-10-CM | POA: Diagnosis not present

## 2020-09-14 DIAGNOSIS — I272 Pulmonary hypertension, unspecified: Secondary | ICD-10-CM | POA: Diagnosis not present

## 2020-09-14 DIAGNOSIS — J42 Unspecified chronic bronchitis: Secondary | ICD-10-CM | POA: Diagnosis not present

## 2020-09-14 NOTE — Progress Notes (Signed)
Telephone  Note  This visit type was conducted due to national recommendations for restrictions regarding the COVID-19 pandemic (e.g. social distancing).  This format is felt to be most appropriate for this patient at this time.  All issues noted in this document were discussed and addressed.  No physical exam was performed (except for noted visual exam findings with Video Visits).   I connected withNAME@ on 09/14/20 at 10:30 AM EDT by a telephone and verified that I am speaking with the correct person using two identifiers. Location patient: home Location provider: work or home office Persons participating in the virtual visit: patient, provider  I discussed the limitations, risks, security and privacy concerns of performing an evaluation and management service by telephone and the availability of in person appointments. I also discussed with the patient that there may be a patient responsible charge related to this service. The patient expressed understanding and agreed to proceed.  Reason for visit:  follow up on grief/depression, morbid obesity,  pulmonary hypertension and aortic atherosclerosis   HPI:   Aortic arch atherosclerosis:  Rminimal atherosclerosis of the aortic arch on 4/018 CT chest .  NO mention on recent films.  LDL 102 in March and HDL 61. .  Patient  Is not interested in taking a statin despite a discussion today of the  role of statin therapy in stablizing placque and preventing events.    Pulmonary hypertension:  she is not dyspneic at rest.    Morbid obesity: she has been trying to increase her activity by walking inside her home and in the hallway.  Sometimes sob if  temp is > 75.  Fiet reviewed:  meals on wheel  provides her hot meal of the day,  she eats cereal and sandwiches on whole wheat for the other twp meals daily     Grief :  she reports that her daily symptoms of sadness have resolved and in general her mood has improved.    OS: See pertinent positives and  negatives per HPI.  Past Medical History:  Diagnosis Date   Anxiety    Bleeding from the nose    Chronic diastolic congestive heart failure ([place])    a. 03/2016 Echo: EF 55-60%, Gr2 DD; b. 08/2016 Echo: EF 60-65%, nl diast fxn; c. 01/2019 Echo: EF 55-60%. Nl RV fxn.   Congenital absence of one kidney    COPD (chronic obstructive pulmonary disease) ([place])    a. 02/2017 PFT: mod/sev obstructive airway dzs w/ significant bronchodilator response.   Depression    [facility]   Dyspnea    Emphysema/COPD [facility])    GERD (gastroesophageal reflux disease)    History of cardiac cath    a. 05/2016 Cath: nl cors.   History of sciatica    Hypertension    Hypothyroidism    Mitral Valve Prolapse & Severe mitral regurgitation s/p MVR    a. 03/2016 Echo: severe MVP involving the posterior leaflet, sev MR;  b. 06/2016 min invasive MV repair w/ triangular resection of flail segment (P2) of posterior leaflet, gore-tex neochord placement x 4, Sorin Memo 3D rin Annulosplasty (57mm, catalog # [id number], ser # [id number]); c. 08/2016 Echo: MV area 2.06 cm^2 (pressure 1/2 time); d. 01/2019 Echo: Mild MS w/ mean grad 55mmHg.   Motion sickness    cars   Pneumonia    PONV (postoperative nausea and vomiting)    Post-op Afib    a. 06/2016 following MV repair-->short-course amio. Coumadin d/c'd 2/2 epistaxis.  Pulmonary hypertension ([place])    a. 08/2016 Echo: PASP 25mmHg; b. 01/2019 Echo: RVSP 45.96mmHg.   S/P patent foramen ovale closure    a. 06/2016 @ time of MV Repair.   Sleep apnea    O2 at night and PRN    Past Surgical History:  Procedure Laterality Date   ABDOMINAL HYSTERECTOMY     BREAST BIOPSY Right 2011   [facility]< benign   CATARACT EXTRACTION W/PHACO Left 11/14/2015   Procedure: CATARACT EXTRACTION PHACO AND INTRAOCULAR LENS PLACEMENT (IOC);  Surgeon: Leandrew Koyanagi, MD;  Location: [place];  Service: Ophthalmology;  Laterality: Left;  sleep apnea [place]   CATARACT EXTRACTION W/PHACO Right  12/19/2015   Procedure: CATARACT EXTRACTION PHACO AND INTRAOCULAR LENS PLACEMENT (IOC);  Surgeon: Leandrew Koyanagi, MD;  Location: [place];  Service: Ophthalmology;  Laterality: Right;  ANXIETY GENEROUS IV SEDATION TORIC LEN   COMBINED HYSTERECTOMY ABDOMINAL W/ A&P REPAIR / OOPHORECTOMY  1996   benign tumor   INNER EAR SURGERY     bilateral   MITRAL VALVE REPAIR Right 07/17/2016   Procedure: MINIMALLY INVASIVE MITRAL VALVE REPAIR (MVR) USING 26 SORIN MEMO 3D ANNULOPLASTY RING;  Surgeon: Rexene Alberts, MD;  Location: [place];  Service: Open Heart Surgery;  Laterality: Right;   PATENT FORAMEN OVALE(PFO) CLOSURE N/A 07/17/2016   Procedure: PATENT FORAMEN OVALE (PFO) CLOSURE;  Surgeon: Rexene Alberts, MD;  Location: [place];  Service: Open Heart Surgery;  Laterality: N/A;   RIGHT/LEFT HEART CATH AND CORONARY ANGIOGRAPHY Bilateral 06/16/2016   Procedure: Right/Left Heart Cath and Coronary Angiography;  Surgeon: Wellington Hampshire, MD;  Location: [place] [facility];  Service: Cardiovascular;  Laterality: Bilateral;   TEE WITHOUT CARDIOVERSION N/A 05/19/2016   Procedure: TRANSESOPHAGEAL ECHOCARDIOGRAM (TEE);  Surgeon: Wellington Hampshire, MD;  Location: [facility];  Service: Cardiovascular;  Laterality: N/A;   TEE WITHOUT CARDIOVERSION N/A 06/09/2016   Procedure: TRANSESOPHAGEAL ECHOCARDIOGRAM (TEE);  Surgeon: Wellington Hampshire, MD;  Location: [facility];  Service: Cardiovascular;  Laterality: N/A;   TEE WITHOUT CARDIOVERSION N/A 06/16/2016   Procedure: TRANSESOPHAGEAL ECHOCARDIOGRAM (TEE);  Surgeon: Wellington Hampshire, MD;  Location: [facility];  Service: Cardiovascular;  Laterality: N/A;   TEE WITHOUT CARDIOVERSION N/A 07/17/2016   Procedure: TRANSESOPHAGEAL ECHOCARDIOGRAM (TEE);  Surgeon: Rexene Alberts, MD;  Location: [place];  Service: Open Heart Surgery;  Laterality: N/A;   TONSILLECTOMY      Family History  Problem Relation Age of Onset   Multiple sclerosis Mother    Hypertension Mother     Coronary artery disease Father    Heart disease Father    Hypertension Father    Heart disease Brother    Cancer Brother    Breast cancer Paternal Aunt     SOCIAL HX:   reports that she has never smoked. She has never used smokeless tobacco. She reports that she does not drink alcohol and does not use drugs.    Current Outpatient Medications:    acetaminophen (TYLENOL) 500 MG tablet, Take 500 mg by mouth 2 (two) times daily as needed for moderate pain or headache., Disp: , Rfl:    albuterol (PROVENTIL) (2.5 MG/3ML) 0.083% nebulizer solution, INHALE THE CONTENTS OF 1 VIAL VIA NEBULIZER EVERY 6 HOURS AS NEEDED FOR WHEEZING  OR FOR SHORTNESS OF BREATH, Disp: 90 mL, Rfl: 1   albuterol (VENTOLIN HFA) 108 (90 Base) MCG/ACT inhaler, INHALE 2 PUFFS INTO THE LUNGS EVERY 6 HOURS AS NEEDED FOR WHEEZING OR SHORTNESS OF BREATH (USE  WITH SPACER), Disp: 1 each, Rfl: 1   aspirin EC 81 MG EC tablet, Take 1 tablet (81 mg total) by mouth daily., Disp: , Rfl:    Calcium Carb-Cholecalciferol 600-800 MG-UNIT TABS, Take 1 tablet by mouth in the morning, at noon, and at bedtime. , Disp: , Rfl:    docusate sodium (COLACE) 100 MG capsule, Take 100 mg by mouth daily as needed for mild constipation., Disp: , Rfl:    ergocalciferol (DRISDOL) 1.25 MG (50000 UT) capsule, Take 1 capsule (50,000 Units total) by mouth once a week., Disp: 12 capsule, Rfl: 0   famotidine (PEPCID AC) 10 MG chewable tablet, Chew 20 mg by mouth daily as needed for heartburn. , Disp: , Rfl:    ferrous sulfate 325 (65 FE) MG tablet, Take 325 mg by mouth daily with breakfast., Disp: , Rfl:    FLOVENT HFA 110 MCG/ACT inhaler, INHALE 2 PUFFS TWICE DAILY, Disp: 36 g, Rfl: 1   furosemide (LASIX) 20 MG tablet, TAKE 1 TABLET EVERY DAY . MAY TAKE AN ADDITIONAL TABLET AS NEEDED FOR WEIGHT GAIN OF 2 LBS OVERNIGHT OR 5 LBS IN ONE WEEK, Disp: 90 tablet, Rfl: 0   levothyroxine (SYNTHROID) 150 MCG tablet, TAKE 1 TABLET EVERY DAY BEFORE BREAKFAST (INCREASED DOSE),  Disp: 90 tablet, Rfl: 1   loratadine (CLARITIN) 10 MG tablet, Take 10 mg by mouth daily., Disp: , Rfl:    losartan (COZAAR) 50 MG tablet, TAKE 1 TABLET EVERY DAY, Disp: 90 tablet, Rfl: 1   OXYGEN, Inhale 2 L into the lungs at bedtime as needed (at bedtime and as needed)., Disp: , Rfl:    oxymetazoline (AFRIN) 0.05 % nasal spray, Place 1 spray into both nostrils 2 (two) times daily. As needed for nose bleed, Disp: , Rfl:    polyethylene glycol powder (GLYCOLAX/MIRALAX) powder, Take 17 g by mouth 2 (two) times daily as needed. (Patient taking differently: Take 17 g by mouth daily as needed for mild constipation.), Disp: 3350 g, Rfl: 1   STIOLTO RESPIMAT 2.5-2.5 MCG/ACT AERS, INHALE  2  PUFFS  INTO  THE  LUNGS EVERY DAY, Disp: 12 g, Rfl: 1   traMADol (ULTRAM) 50 MG tablet, Take 1-2 tablets (50-100 mg total) by mouth every 4 (four) hours as needed for moderate pain., Disp: 30 tablet, Rfl: 0   vitamin B-12 (CYANOCOBALAMIN) 1000 MCG tablet, Take 1 tablet (1,000 mcg total) by mouth daily., Disp: 90 tablet, Rfl: 3  EXAM:  General impression: alert, cooperative and articulate.  No signs of being in distress  Lungs: speech is fluent sentence length suggests that patient is not short of breath and not punctuated by cough, sneezing or sniffing. [name]   Psych: affect normal.  speech is articulate and non pressured .  Denies suicidal thoughts   ASSESSMENT AND PLAN:  Discussed the following assessment and plan:  Chronic diastolic congestive heart failure (HCC)  Chronic bronchitis, unspecified chronic bronchitis type (HCC)  Primary hypertension  Nocturnal hypoxemia due to obstructive chronic bronchitis (HCC)  Obesity (BMI 30-39.9)  Grief reaction with prolonged bereavement  Aortic atherosclerosis (HCC)  Pulmonary hypertension (HCC)  Chronic diastolic congestive heart failure ([place]) Managed with daily use of  lasix and losartan and prn extra doses for wt gain of 2 lb  overnight. .  Currently taking 20  mg lasix daily  Lab Results  Component Value Date   CREATININE 0.84 06/13/2020    COPD (chronic obstructive pulmonary disease) ([place]) She denies any recent asthma exacerbations.  She has  been advised to continue current regimen and  follow up with pulmonary as advised.   Hypertension Home readings reviewed,  HTN is well controlled on current regimen of losartan 50 mg daily  Renal function stable, no changes today.  Lab Results  Component Value Date   CREATININE 0.84 06/13/2020   Lab Results  Component Value Date   NA 143 06/13/2020   K 4.0 06/13/2020   CL 101 06/13/2020   CO2 33 (H) 06/13/2020     Nocturnal hypoxemia due to obstructive chronic bronchitis (HCC) She Continues to require nocturnal use of supplemental oxygen at 2 L/min  Obesity (BMI 30-39.9) Reviewed her 84 lb weight gain since 2017,  Caused largely by inactivity and use of Meals on Wheels .  Diet reviewed.  Low cost prepared meals from [employer] recommended as alternatives to sandwiches, canned soup and fast food. Screening for thyroid diabetes done  Lab Results  Component Value Date   TSH 1.03 06/13/2020      Grief reaction with prolonged bereavement Paying out of pocket $13/month due to Medicare non  reimbursement  .  Advised her to continue supplementing with whatever source is less costly:  OTC supplements 10,000 Ius  5 days per week  Or 7,000 Ius daily   Aortic atherosclerosis (HCC)  Aortic atherosclerosis :  Discussed need for statin therapy given documented evidence of moderate  atherosclerosis in the aorta noted on recent  CT of abdomen and  pelvis and the prognostic implications of this finding. She declines therapy at this time   Pulmonary hypertension ([place]) Mild by April 2022 ECHO,  She is asymptomatic at rest but has dyspnea with exertion . Follow up with cardiology and pulmonology  As needed.  A total of 25 minutes of  telephone time was spent with patient more than half of which was spent  in counselling about her diet and activity level,  the treatment for aortic atherosclerosis,  itions  and coordination of care     I discussed the assessment and treatment plan with the patient. The patient was provided an opportunity to ask questions and all were answered. The patient agreed with the plan and demonstrated an understanding of the instructions.   The patient was advised to call back or seek an in-person evaluation if the symptoms worsen or if the condition fails to improve as anticipated.   I spent 30 minutes dedicated to the care of this patient on the date of this encounter to include pre-visit review of his medical history,  Face-to-face time with the patient , and post visit ordering of testing and therapeutics.    [name], MD

## 2020-09-16 DIAGNOSIS — I700 Atherosclerosis of aorta: Secondary | ICD-10-CM | POA: Insufficient documentation

## 2020-09-16 NOTE — Assessment & Plan Note (Signed)
She Continues to require nocturnal use of supplemental oxygen at 2 L/min

## 2020-09-16 NOTE — Assessment & Plan Note (Signed)
  Aortic atherosclerosis :  Discussed need for statin therapy given documented evidence of moderate  atherosclerosis in the aorta noted on recent  CT of abdomen and  pelvis and the prognostic implications of this finding. She declines therapy at this time

## 2020-09-16 NOTE — Assessment & Plan Note (Signed)
She denies any recent asthma exacerbations.  She has been advised to continue current regimen and  follow up with pulmonary as advised.

## 2020-09-16 NOTE — Assessment & Plan Note (Addendum)
Mild by April 2022 ECHO,  She is asymptomatic at rest but has dyspnea with exertion . Follow up with cardiology and pulmonology  As needed.

## 2020-09-16 NOTE — Assessment & Plan Note (Signed)
Home readings reviewed,  HTN is well controlled on current regimen of losartan 50 mg daily  Renal function stable, no changes today.  Lab Results  Component Value Date   CREATININE 0.84 06/13/2020   Lab Results  Component Value Date   NA 143 06/13/2020   K 4.0 06/13/2020   CL 101 06/13/2020   CO2 33 (H) 06/13/2020

## 2020-09-16 NOTE — Assessment & Plan Note (Signed)
Managed with daily use of  lasix and losartan and prn extra doses for wt gain of 2 lb  overnight. .  Currently taking 20 mg lasix daily  Lab Results  Component Value Date   CREATININE 0.84 06/13/2020

## 2020-09-16 NOTE — Assessment & Plan Note (Signed)
Reviewed her 67 lb weight gain since 2017,  Caused largely by inactivity and use of Meals on Wheels .  Diet reviewed.  Low cost prepared meals from [employer] recommended as alternatives to sandwiches, canned soup and fast food. Screening for thyroid diabetes done  Lab Results  Component Value Date   TSH 1.03 06/13/2020

## 2020-09-16 NOTE — Assessment & Plan Note (Signed)
Paying out of pocket $13/month due to Medicare non  reimbursement  .  Advised her to continue supplementing with whatever source is less costly:  OTC supplements 10,000 Ius  5 days per week  Or 7,000 Ius daily

## 2020-09-26 ENCOUNTER — Other Ambulatory Visit: Payer: Self-pay | Admitting: Internal Medicine

## 2020-10-02 ENCOUNTER — Other Ambulatory Visit: Payer: Self-pay

## 2020-10-02 DIAGNOSIS — J449 Chronic obstructive pulmonary disease, unspecified: Secondary | ICD-10-CM

## 2020-10-02 MED ORDER — FUROSEMIDE 20 MG PO TABS: ORAL_TABLET | ORAL | 0 refills | Status: DC

## 2020-10-03 ENCOUNTER — Other Ambulatory Visit: Payer: Self-pay | Admitting: Internal Medicine

## 2020-10-05 DIAGNOSIS — J449 Chronic obstructive pulmonary disease, unspecified: Secondary | ICD-10-CM | POA: Diagnosis not present

## 2020-10-15 ENCOUNTER — Other Ambulatory Visit: Payer: Self-pay | Admitting: Internal Medicine

## 2020-11-05 ENCOUNTER — Telehealth (INDEPENDENT_AMBULATORY_CARE_PROVIDER_SITE_OTHER): Payer: Medicare HMO | Admitting: Physician Assistant

## 2020-11-05 ENCOUNTER — Encounter: Payer: Self-pay | Admitting: Physician Assistant

## 2020-11-05 ENCOUNTER — Other Ambulatory Visit: Payer: Self-pay

## 2020-11-05 VITALS — BP 124/79 | HR 93 | Ht 62.0 in | Wt 250.0 lb

## 2020-11-05 DIAGNOSIS — I480 Paroxysmal atrial fibrillation: Secondary | ICD-10-CM

## 2020-11-05 DIAGNOSIS — I10 Essential (primary) hypertension: Secondary | ICD-10-CM

## 2020-11-05 DIAGNOSIS — J449 Chronic obstructive pulmonary disease, unspecified: Secondary | ICD-10-CM | POA: Diagnosis not present

## 2020-11-05 DIAGNOSIS — I5033 Acute on chronic diastolic (congestive) heart failure: Secondary | ICD-10-CM

## 2020-11-05 DIAGNOSIS — R079 Chest pain, unspecified: Secondary | ICD-10-CM | POA: Diagnosis not present

## 2020-11-05 DIAGNOSIS — E034 Atrophy of thyroid (acquired): Secondary | ICD-10-CM

## 2020-11-05 DIAGNOSIS — Z9889 Other specified postprocedural states: Secondary | ICD-10-CM | POA: Diagnosis not present

## 2020-11-05 DIAGNOSIS — I5032 Chronic diastolic (congestive) heart failure: Secondary | ICD-10-CM

## 2020-11-05 NOTE — Patient Instructions (Signed)
Medication Instructions:  No changes at this time.  *If you need a refill on your cardiac medications before your next appointment, please call your pharmacy*   Lab Work: None  If you have labs (blood work) drawn today and your tests are completely normal, you will receive your results only by: [place] (if you have MyChart) OR A paper copy in the mail If you have any lab test that is abnormal or we need to change your treatment, we will call you to review the results.   Testing/Procedures: None   Follow-Up: At [facility], you and your health needs are our priority.  As part of our continuing mission to provide you with exceptional heart care, we have created designated Provider Care Teams.  These Care Teams include your primary Cardiologist (physician) and Advanced Practice Providers (APPs -  Physician Assistants and Nurse Practitioners) who all work together to provide you with the care you need, when you need it.  We recommend signing up for the patient portal called "MyChart".  Sign up information is provided on this After Visit Summary.  MyChart is used to connect with patients for Virtual Visits (Telemedicine).  Patients are able to view lab/test results, encounter notes, upcoming appointments, etc.  Non-urgent messages can be sent to your provider as well.   To learn more about what you can do with MyChart, go to [email or web address].    Your next appointment:   1 month(s)  The format for your next appointment:   Virtual Visit   Provider:   You may see Kathlyn [place], MD or one of the following Advanced Practice Providers on your designated Care Team:   Murray Hodgkins, NP Christell Faith, PA-C Marrianne Mood, PA-C Cadence [place], [place]

## 2020-11-05 NOTE — Progress Notes (Signed)
Virtual Visit via Telephone Note   This visit type was conducted due to national recommendations for restrictions regarding the COVID-19 Pandemic (e.g. social distancing) in an effort to limit this patient's exposure and mitigate transmission in our community.  Due to her co-morbid illnesses, this patient is at least at moderate risk for complications without adequate follow up.  This format is felt to be most appropriate for this patient at this time.  The patient did not have access to video technology/had technical difficulties with video requiring transitioning to audio format only (telephone).  All issues noted in this document were discussed and addressed.  No physical exam could be performed with this format.  Please refer to the patient's chart for her  consent to telehealth for [facility].   Date:  11/05/2020   ID:  Lauren Bartlett, DOB 11/12/1955, MRN [id number]  Patient Location: Home Provider Location: Office/Clinic  PCP:  [name], MD  Cardiologist:  Kathlyn Sacramento, MD  Electrophysiologist:  None   Evaluation Performed:  Follow-Up Visit  Chief Complaint:  65 y.o. female with history of mitral valve prolapse and severe mitral regurgitation s/p mitral valve repair (April 2018), hypertension, HFpEF, COPD, pulmonary arterial HTN, congenital solitary kidney, hypothyroidism, and who is contacted via telephone only visit for 6 month follow-up and after her recent echo.  Chief Complaint  Patient presents with   Follow-up    6 month F/U-No new cardiac concerns     History of Present Illness:    JAIE ANDA is a 65 y.o. female with history of mitral valve prolapse and severe MR s/p mitral valve repair 06/2016, hypertension, HFpEF, COPD, pulmonary arterial hypertension, congenital solitary kidney, hypothyroidism, and who is seen today via telephone only visit for follow-up of hypertension and valvular/cardiac disease.  She has a history of mitral valve disease s/p repair due  to mitral valve prolapse and severe MR 06/2016.  Catheterization prior to valvular repair revealed normal cors.  Echo 08/2016 showed normal LVEF, mild to moderate TR, PAH with PASP 54 mmHg.  01/2019 echo showed normal LV SF, mild mitral stenosis, RVSP 45.3 mmHg.  She had multiple ED visits in February 2021 due to dyspnea and treated with Solu-Medrol.  She was seen in our clinic with increasing dyspnea and Lasix increased to 40 mg daily for 1 week due to fluid retention, suspected 2/2 recent steroids.  She was admitted 06/09/2019 to 06/11/2019 for COPD exacerbation and possible exacerbation of HFpEF.  She was treated with steroids, antibiotics, and IV diuresed with improvement in symptoms.  She was discharged with supplemental oxygen.  She was seen via telemedicine 10/06/2019 and reportedly felt well from a cardiac standpoint.  She reported stable dyspnea on exertion.  She had some mild wheezing, attributed to her COPD, and reportedly at her baseline.  She also reported a longstanding / unchanged /chronic dry cough.  She had not recently used her albuterol.  She was trying to be more active.  She reported palpitations that woke her from sleep/dreaming.  She felt as if they may be 2/2 her dream.  No reported daytime palpitations.  She reported mild ankle edema.  She did feel as if she was eating more sodium over the weekend with weight stable.  She reported obtaining low-sodium meals through [employer].  She intended to elevate her feet more often.  Recommendations were for continued Lasix 20 mg daily with extra tablet as needed for weight gain of 2 pounds overnight or 5  pounds in 1 week.  She was advised to continue to follow-up with pulmonology as directed.  Contacted via telemedicine 04/09/2020 and noted stable dyspnea on exertion.  She had dry cough with some wheezing.  She noted some episodes of waking overnight, attributed to bad dreams.  These wakings were usually associated with racing heart rate.  She was  walking around her home for activity, estimating 1 mile per day.  She could not walk as far she used to in the past and was more tired easily.  She was eager to start walking outside but wanted to wait until it was warmer.  She had an aide at home.  She was receiving low-salt and heart healthy [employer] Whe[employer].  She noted periodic swelling in her ankles, improving with elevation.  She had some nosebleeds, attributed to dry air.  She was sleeping with a humidifier. Home SBP 120s.  She was sleeping with 2-3 pillows.she was taking Lasix 20 mg daily and monitoring her weight.   07/24/2020 echo with stable findings from 2020 and showing EF 55 to 60%.  Images were suboptimal to evaluate for R WMA or diastolic parameters.  There is mildly elevated PASP.  Mild LAE.  Moderate MS was noted with mean mitral valve gradient 8.0 mmHg.  A prosthetic angioplasty ring was present in the mitral position.  Aortic valve not well visualized but appeared to be functioning well.  RAP 8 mmHg  Today, 11/05/2020, she returns to clinic and notes that she has recently felt short winded.  She attributes this to the heat, rather than volume.  She reports that she was very busy and running around in the heat all weekend.  She recently went out of town to a bridal shower in [place].  She also went to church.  In addition feeling short winded, she has been gaining weight recently.  It was discussed that this can be volume with recommendations regarding increased diuresis discussed, though the patient feels strongly that her symptoms are due to her weekend events.  She reports the weight gain is due to items that she ate over the weekend.  We did discuss that caloric intake results and slower weight gain.  Home BP 124/72.  She reports using 3 pillows.  She has taken Lasix 20 mg every day.  She is not using extra Lasix.  She reports some PND, waking short of breath.  She also continues to wake with racing heart rate.  She reports taking 2 sleep  study tests that were negative for sleep apnea.  She reports it is too warm to walk outside.  She is trying to walk up and down the halls of her home, as well as the grocery store.  She does note some chest pain or shortness of breath with this activity.  She also notes shortness of breath when getting up to move around.  Sometimes, she has to stop and catch her breath, though the symptoms are inconsistent.  She reports need to make an appointment with pulmonology.  The patient does not have symptoms concerning for COVID-19 infection (fever, chills, cough, or new shortness of breath).   Past Medical History:  Diagnosis Date   Anxiety    Bleeding from the nose    Chronic diastolic congestive heart failure ([place])    a. 03/2016 Echo: EF 55-60%, Gr2 DD; b. 08/2016 Echo: EF 60-65%, nl diast fxn; c. 01/2019 Echo: EF 55-60%. Nl RV fxn.   Congenital absence of one kidney  COPD (chronic obstructive pulmonary disease) ([place])    a. 02/2017 PFT: mod/sev obstructive airway dzs w/ significant bronchodilator response.   Dep[facility]    [facility]   Dyspnea    Emphysema/COPD [facility])    GERD (gastroesophageal reflux disease)    History of cardiac cath    a. 05/2016 Cath: nl cors.   History of sciatica    Hypertension    Hypothyroidism    Mitral Valve Prolapse & Severe mitral regurgitation s/p MVR    a. 03/2016 Echo: severe MVP involving the posterior leaflet, sev MR;  b. 06/2016 min invasive MV repair w/ triangular resection of flail segment (P2) of posterior leaflet, gore-tex neochord placement x 4, Sorin Memo 3D rin Annulosplasty (46m, catalog # [id number] ser # [id number]; c. 08/2016 Echo: MV area 2.06 cm^2 (pressure 1/2 time); d. 01/2019 Echo: Mild MS w/ mean grad 857mg.   Motion sickness    cars   Pneumonia    PONV (postoperative nausea and vomiting)    Post-op Afib    a. 06/2016 following MV repair-->short-course amio. Coumadin d/c'd 2/2 epistaxis.   Pulmonary hypertension ([place]   a. 08/2016 Echo:  PASP 5449m; b. 01/2019 Echo: RVSP 45.3mm1m   S/P patent foramen ovale closure    a. 06/2016 @ time of MV Repair.   Sleep apnea    O2 at night and PRN   Past Surgical History:  Procedure Laterality Date   ABDOMINAL HYSTERECTOMY     BREAST BIOPSY Right 2011   [facility]< benign   CATARACT EXTRACTION W/PHACO Left 11/14/2015   Procedure: CATARACT EXTRACTION PHACO AND INTRAOCULAR LENS PLACEMENT (IOC);  Surgeon: ChadLeandrew Koyanagi;  [facility];  Laterality: Left;  sleep apnea Toric   CATARACT EXTRACTION W/PHACO Right 12/19/2015   Procedure: CATARACT EXTRACTION PHACO AND INTRAOCULAR LENS PLACEMENT (IOC);  Surgeon: ChadLeandrew Koyanagi;  [place] [facility];  Laterality: Right;  ANXIETY GENEROUS IV SEDATION TORIC LEN   COMBINED HYSTERECTOMY ABDOMINAL W/ A&P REPAIR / OOPHORECTOMY  1996   benign tumor   INNER EAR SURGERY     bilateral   MITRAL VALVE REPAIR Right 07/17/2016   Procedure: MINIMALLY INVASIVE MITRAL VALVE REPAIR (MVR) USING 26 SORIN MEMO 3D ANNULOPLASTY RING;  Surgeon: ClarRexene Alberts;  [facility];  Laterality: Right;   PATENT FORAMEN OVALE(PFO) CLOSURE N/A 07/17/2016   Procedure: PATENT FORAMEN OVALE (PFO) CLOSURE;  Surgeon: ClarRexene Alberts;  [facility];  Laterality: N/A;   RIGHT/LEFT HEART CATH AND CORONARY ANGIOGRAPHY Bilateral 06/16/2016   Procedure: Right/Left Heart Cath and Coronary Angiography;  Surgeon: MuhaWellington Hampshire;  [facility];  Service: Cardiovascular;  Laterality: Bilateral;   TEE WITHOUT CARDIOVERSION N/A 05/19/2016   Procedure: TRANSESOPHAGEAL ECHOCARDIOGRAM (TEE);  Surgeon: MuhaWellington Hampshire;  Location: [facility];  Service: Cardiovascular;  Laterality: N/A;   TEE WITHOUT CARDIOVERSION N/A 06/09/2016   Procedure: TRANSESOPHAGEAL ECHOCARDIOGRAM (TEE);  Surgeon: MuhaWellington Hampshire;  Location:  [facility] ORS;  Service: Cardiovascular;  Laterality: N/A;   TEE WITHOUT CARDIOVERSION N/A 06/16/2016   Procedure: TRANSESOPHAGEAL ECHOCARDIOGRAM (TEE);  Surgeon: MuhaWellington Hampshire;  Location: [facility] OR[facility];  Service: Cardiovascular;  Laterality: N/A;   TEE WITHOUT CARDIOVERSION N/A 07/17/2016   Procedure: TRANSESOPHAGEAL ECHOCARDIOGRAM (TEE);  Surgeon: ClarRexene Alberts;  [facility];  Laterality: N/A;  TONSILLECTOMY       Current Meds  Medication Sig   acetaminophen (TYLENOL) 500 MG tablet Take 500 mg by mouth 2 (two) times daily as needed for moderate pain or headache.   albuterol (PROVENTIL) (2.5 MG/3ML) 0.083% nebulizer solution INHALE THE CONTENTS OF 1 VIAL VIA NEBULIZER EVERY 6 HOURS AS NEEDED FOR WHEEZING  OR FOR SHORTNESS OF BREATH   albuterol (VENTOLIN HFA) 108 (90 Base) MCG/ACT inhaler INHALE 2 PUFFS INTO THE LUNGS EVERY 6 HOURS AS NEEDED FOR WHEEZING OR SHORTNESS OF BREATH (USE WITH SPACER)   aspirin EC 81 MG EC tablet Take 1 tablet (81 mg total) by mouth daily.   Calcium Carb-Cholecalciferol 600-800 MG-UNIT TABS Take 1 tablet by mouth in the morning, at noon, and at bedtime.    docusate sodium (COLACE) 100 MG capsule Take 100 mg by mouth daily.   famotidine (PEPCID AC) 10 MG chewable tablet Chew 20 mg by mouth daily as needed for heartburn.    ferrous sulfate 325 (65 FE) MG tablet Take 325 mg by mouth daily with breakfast.   FLOVENT HFA 110 MCG/ACT inhaler INHALE 2 PUFFS TWICE DAILY   furosemide (LASIX) 20 MG tablet TAKE 1 TABLET EVERY DAY . MAY TAKE AN ADDITIONAL TABLET AS NEEDED FOR WEIGHT GAIN OF 2 LBS OVERNIGHT OR 5 LBS IN ONE WEEK   levothyroxine (SYNTHROID) 150 MCG tablet TAKE 1 TABLET EVERY DAY BEFORE BREAKFAST (INCREASED DOSE)   loratadine (CLARITIN) 10 MG tablet Take 10 mg by mouth daily.   losartan (COZAAR) 50 MG tablet TAKE 1 TABLET EVERY DAY   OXYGEN Inhale 2 L into the lungs at bedtime as needed (at bedtime and as needed).   oxymetazoline  (AFRIN) 0.05 % nasal spray Place 1 spray into both nostrils 2 (two) times daily. As needed for nose bleed   polyethylene glycol powder (GLYCOLAX/MIRALAX) powder Take 17 g by mouth 2 (two) times daily as needed.   STIOLTO RESPIMAT 2.5-2.5 MCG/ACT AERS INHALE  2  PUFFS  INTO  THE  LUNGS EVERY DAY   traMADol (ULTRAM) 50 MG tablet Take 1-2 tablets (50-100 mg total) by mouth every 4 (four) hours as needed for moderate pain.   vitamin B-12 (CYANOCOBALAMIN) 1000 MCG tablet Take 1 tablet (1,000 mcg total) by mouth daily.   Vitamin D, Ergocalciferol, (DRISDOL) 1.25 MG (50000 UNIT) CAPS capsule TAKE 1 CAPSULE (50,000 UNITS TOTAL) BY MOUTH ONCE A WEEK.     Allergies:   Codeine, Levaquin [levofloxacin], Penicillins, Zithromax [azithromycin], and Prednisone   Social History   Tobacco Use   Smoking status: Never   Smokeless tobacco: Never  Vaping Use   Vaping Use: Never used  Substance Use Topics   Alcohol use: No   Drug use: No     Family Hx: The patient's family history includes Breast cancer in her paternal aunt; Cancer in her brother; Coronary artery disease in her father; Heart disease in her brother and father; Hypertension in her father and mother; Multiple sclerosis in her mother.  ROS:   Please see the history of present illness.     She reports some chest pain and shortness of breath with exertion.  No chest pain or shortness of breath at rest.  She reports waking with shortness of breath and racing heart rate.  She reports ongoing orthopnea, now consistently 3 pillows.  She reports weight gain, though she denies that this is due to volume, and she feels strongly it is due to caloric intake over the previous days.  No nausea, vomiting, dizziness, syncope.  No early satiety.  She reports stable lower extremity edema.  All other systems reviewed and are negative.   Objective:    Vital Signs:  BP 124/79 (BP Location: Left Arm)   Pulse 93   Ht '5\' 2"'$  (1.575 m)   Wt 250 lb (113.4 kg)   BMI  45.73 kg/m    VITAL SIGNS:  reviewed  Accessory clinical findings reviewed:    EKG:  No ECG reviewed.  Previous vitals reviewed today:    Temp Readings from Last 3 Encounters:  06/13/20 97.9 F (36.6 C) (Oral)  03/02/20 98.5 F (36.9 C) (Oral)  02/02/20 97.7 F (36.5 C) (Oral)   BP Readings from Last 3 Encounters:  11/05/20 124/79  09/14/20 140/78  06/13/20 128/86   Pulse Readings from Last 3 Encounters:  11/05/20 93  09/14/20 90  06/13/20 85    Wt Readings from Last 3 Encounters:  11/05/20 250 lb (113.4 kg)  09/14/20 249 lb 6.4 oz (113.1 kg)  08/20/20 247 lb 8 oz (112.3 kg)    - Labs reviewed today:     Lab Results  Component Value Date   WBC 6.2 06/13/2020   HGB 14.4 06/13/2020   HCT 43.5 06/13/2020   MCV 91.7 06/13/2020   PLT 163.0 06/13/2020   Lab Results  Component Value Date   CREATININE 0.84 06/13/2020   BUN 16 06/13/2020   NA 143 06/13/2020   K 4.0 06/13/2020   CL 101 06/13/2020   CO2 33 (H) 06/13/2020   Lab Results  Component Value Date   ALT 14 06/13/2020   AST 17 06/13/2020   ALKPHOS 71 06/13/2020   BILITOT 0.6 06/13/2020   Lab Results  Component Value Date   CHOL 179 06/13/2020   HDL 60.60 06/13/2020   LDLCALC 102 (H) 06/13/2020   LDLDIRECT 101.0 07/30/2017   TRIG 82.0 06/13/2020   CHOLHDL 3 06/13/2020    Lab Results  Component Value Date   HGBA1C 5.5 01/26/2019   Lab Results  Component Value Date   TSH 1.03 06/13/2020     Prior CV Studies reviewed today:    The following studies were reviewed today:  Echo 07/24/2020 1. Left ventricular ejection fraction, by estimation, is 55 to 60%. The  left ventricle has normal function. Left ventricular endocardial border  not optimally defined to evaluate regional wall motion. Left ventricular  diastolic parameters are  indeterminate.   2. Right ventricular systolic function is normal. The right ventricular  size is normal. There is mildly elevated pulmonary artery systolic   pressure.   3. Left atrial size was mildly dilated.   4. The mitral valve was not well visualized. No evidence of mitral valve  regurgitation. Moderate mitral stenosis. The mean mitral valve gradient is  8.0 mmHg. There is a prosthetic annuloplasty ring present in the mitral  position. Procedure Date: 06/2016.   5. The aortic valve was not well visualized. Aortic valve regurgitation  is not visualized. No aortic stenosis is present.   6. The inferior vena cava is normal in size with <50% respiratory  variability, suggesting right atrial pressure of 8 mmHg.   [place] LE 05/2019 IMPRESSION: No femoropopliteal DVT nor evidence of DVT within the visualized calf veins.  Echo 01/2019  1. Left ventricular ejection fraction, by visual estimation, is 55 to  60%. The left ventricle has normal function. There is mildly increased  left ventricular hypertrophy.   2. Global right ventricle has normal systolic  function.The right  ventricular size is normal. No increase in right ventricular wall  thickness.   3. The mitral valve has been repaired/replaced. No evidence of mitral  valve regurgitation. Mild mitral stenosis with mean gradient of 8 mm Hg.   4. The tricuspid valve is normal in structure. Tricuspid valve  regurgitation is trivial.   5. The aortic valve is normal in structure. Aortic valve regurgitation is  not visualized. No evidence of aortic valve sclerosis or stenosis.   6. Moderately elevated pulmonary artery systolic pressure.   7. The inferior vena cava is dilated in size with >50% respiratory  variability, suggesting right atrial pressure of 8 mmHg.   8. The tricuspid regurgitant velocity is 3.05 m/s, and with an assumed  right atrial pressure of 8 mmHg, the estimated right ventricular systolic  pressure is moderately elevated at 45.3 mmHg.   [facility] The left ventricular systolic function is normal. LV end diastolic pressure is normal. The left ventricular ejection fraction  is 55-65% by visual estimate. There is severe (4+) mitral regurgitation. 1. Normal coronary arteries. Catheter-induced spasm in the ostial right coronary artery with no significant pressure dampening. This improved with catheter pullback. 2. Normal LV systolic function. 3. Severe mitral regurgitation by left ventricular angiography with dilated left atrium. 4. Right heart catheterization showed mildly elevated filling pressures, prominent V wave on pulmonary capillary wedge pressure, moderate pulmonary hypertension and high cardiac output. RA pressure: 6 mmHg, RV pressure: 53/7, PA pressure: 52/15 with a mean of 35 mmHg, pulmonary capillary wedge pressure: 17 mmHg. Cardiac output was 6.9 with a cardiac index of 3.5 Recommendations: Recommend evaluation for mitral valve repair. The patient needs to see pulmonary in order to make sure she is a candidate.   ASSESSMENT & PLAN:    Chronic HFpEF Pulmonary arterial hypertension -- Reports slightly worsened symptoms when compared with previous visits and with consideration of known COPD.  Updated echo reviewed at length. She has increased shortness of breath with activity, new PND, increased orthopnea, and new weight gain.  We discussed the symptoms is consistent with volume overload.  She feels strongly that her symptoms are not volume but rather an very eventful weekend and caloric intake. It was reviewed that this was less likely the case and recommendation was for increased diuresis. At this time, she politely declines increase of diuresis. Currently, she is taking Lasix at 20 mg daily with additional 1 tablet for weight gain of 2lbs overnight or 5lbs in a week. She has yet to take an extra tablet, despite wt gain. After a long discussion, she is agreeable to call the office if her sx persist past M/T of this week, at which time increased diuresis is recommended for at least 3 days with BMET. Reminded to continue daily weights, BP checks.  Continue to  monitor fluid intake and salt intake.  Recommend increase activity as tolerated.   COPD -- Continue current inhalers as directed.  Continue to follow with pulmonology as directed. Number for pulmonology provided today.  Essential hypertension -- Reports BP well controlled on current medications.  As above, she prefers to defer any increase in diuresis recommended today. Continue ARB. Continue to log BP at home.  Call the office if consistently elevated over 130/80.  Reviewed total daily recommendations for salt and fluids.   S/p mitral valve repair --Reports some sx of CP, as well as DOE/SOB as above. Updated echo with moderate MS and MV gradient 8.34mHg.  Recommend continue to monitor with  periodic echo. Ongoing heart rate and blood pressure control recommended.  Chest pain of unknown etiology --Reports CP and SOB with exertion. Considered is CP due to cor insufficiency versus volume, given suspect she is volume up. Also considered is anginal equivalent, given her SOB, as well as SOB due to MV dz. She declines medication changes or further workup today. Continue current medications.   COVID-19 Education: The signs and symptoms of COVID-19 were discussed with the patient and how to seek care for testing (follow up with PCP or arrange E-visit).  The importance of social distancing was discussed today.  Time:   Today, I have spent 30 minutes total in reviewing the pt chart and with the patient and telehealth technology discussing the above problems.     Medication Adjustments/Labs and Tests Ordered: Current medicines are reviewed at length with the patient today.  Concerns regarding medicines are outlined above.   Medication changes:Declines medication changes, including recommended increased diuresis for at least 3d (if not more) as outlined above and with repeat BMET. Agreeable to call the office if ongoing sx, given suspicion that volume up. Also will need to reassess CP at next  visit. Disposition: RTC 1 month, sooner if needed   Signed, Magda Bernheim  11/05/2020  [facility]

## 2020-11-06 ENCOUNTER — Other Ambulatory Visit: Payer: Self-pay

## 2020-11-06 DIAGNOSIS — J449 Chronic obstructive pulmonary disease, unspecified: Secondary | ICD-10-CM

## 2020-11-06 MED ORDER — FUROSEMIDE 20 MG PO TABS: ORAL_TABLET | ORAL | 0 refills | Status: DC

## 2020-11-06 NOTE — Telephone Encounter (Signed)
Refill sent for Furosemide 20 mg  

## 2020-11-07 ENCOUNTER — Other Ambulatory Visit: Payer: Self-pay | Admitting: Internal Medicine

## 2020-11-07 DIAGNOSIS — E034 Atrophy of thyroid (acquired): Secondary | ICD-10-CM

## 2020-11-26 ENCOUNTER — Other Ambulatory Visit: Payer: Self-pay

## 2020-11-26 ENCOUNTER — Emergency Department: Payer: Medicare HMO

## 2020-11-26 ENCOUNTER — Emergency Department
Admission: EM | Admit: 2020-11-26 | Discharge: 2020-11-26 | Disposition: A | Discharge status: Home / Self Care | Payer: Medicare HMO | Attending: Emergency Medicine | Admitting: Emergency Medicine

## 2020-11-26 DIAGNOSIS — I110 Hypertensive heart disease with heart failure: Secondary | ICD-10-CM | POA: Insufficient documentation

## 2020-11-26 DIAGNOSIS — R0602 Shortness of breath: Secondary | ICD-10-CM | POA: Diagnosis not present

## 2020-11-26 DIAGNOSIS — Z7982 Long term (current) use of aspirin: Secondary | ICD-10-CM | POA: Diagnosis not present

## 2020-11-26 DIAGNOSIS — Z7951 Long term (current) use of inhaled steroids: Secondary | ICD-10-CM | POA: Insufficient documentation

## 2020-11-26 DIAGNOSIS — R053 Chronic cough: Secondary | ICD-10-CM | POA: Diagnosis not present

## 2020-11-26 DIAGNOSIS — I517 Cardiomegaly: Secondary | ICD-10-CM | POA: Diagnosis not present

## 2020-11-26 DIAGNOSIS — J449 Chronic obstructive pulmonary disease, unspecified: Secondary | ICD-10-CM | POA: Insufficient documentation

## 2020-11-26 DIAGNOSIS — Z79899 Other long term (current) drug therapy: Secondary | ICD-10-CM | POA: Diagnosis not present

## 2020-11-26 DIAGNOSIS — I5032 Chronic diastolic (congestive) heart failure: Secondary | ICD-10-CM | POA: Insufficient documentation

## 2020-11-26 DIAGNOSIS — R040 Epistaxis: Secondary | ICD-10-CM | POA: Diagnosis not present

## 2020-11-26 DIAGNOSIS — E039 Hypothyroidism, unspecified: Secondary | ICD-10-CM | POA: Diagnosis not present

## 2020-11-26 DIAGNOSIS — R0902 Hypoxemia: Secondary | ICD-10-CM | POA: Diagnosis not present

## 2020-11-26 DIAGNOSIS — R58 Hemorrhage, not elsewhere classified: Secondary | ICD-10-CM | POA: Diagnosis not present

## 2020-11-26 LAB — COMPREHENSIVE METABOLIC PANEL
ALT: 30 U/L (ref 0–44)
AST: 27 U/L (ref 15–41)
Albumin: 3.9 g/dL (ref 3.5–5.0)
Alkaline Phosphatase: 79 U/L (ref 38–126)
Anion gap: 9 (ref 5–15)
BUN: 21 mg/dL (ref 8–23)
CO2: 28 mmol/L (ref 22–32)
Calcium: 8 mg/dL — ABNORMAL LOW (ref 8.9–10.3)
Chloride: 102 mmol/L (ref 98–111)
Creatinine, Ser: 0.88 mg/dL (ref 0.44–1.00)
GFR, Estimated: >60 mL/min (ref >60)
Glucose, Bld: 89 mg/dL (ref 70–99)
Potassium: 4.1 mmol/L (ref 3.5–5.1)
Sodium: 139 mmol/L (ref 135–145)
Total Bilirubin: 0.6 mg/dL (ref 0.3–1.2)
Total Protein: 7.2 g/dL (ref 6.5–8.1)

## 2020-11-26 LAB — CBC WITH DIFFERENTIAL/PLATELET
Abs Immature Granulocytes: 0.03 10*3/uL (ref 0.00–0.07)
Basophils Absolute: 0 10*3/uL (ref 0.0–0.1)
Basophils Relative: 1 %
Eosinophils Absolute: 0.1 10*3/uL (ref 0.0–0.5)
Eosinophils Relative: 2 %
HCT: 44.7 % (ref 36.0–46.0)
Hemoglobin: 14.8 g/dL (ref 12.0–15.0)
Immature Granulocytes: 0 %
Lymphocytes Relative: 13 %
Lymphs Abs: 1.1 10*3/uL (ref 0.7–4.0)
MCH: 31.4 pg (ref 26.0–34.0)
MCHC: 33.1 g/dL (ref 30.0–36.0)
MCV: 94.7 fL (ref 80.0–100.0)
Monocytes Absolute: 0.5 10*3/uL (ref 0.1–1.0)
Monocytes Relative: 6 %
Neutro Abs: 6.6 10*3/uL (ref 1.7–7.7)
Neutrophils Relative %: 78 %
Platelets: 167 10*3/uL (ref 150–400)
RBC: 4.72 MIL/uL (ref 3.87–5.11)
RDW: 13.4 % (ref 11.5–15.5)
WBC: 8.4 10*3/uL (ref 4.0–10.5)
nRBC: 0 % (ref 0.0–0.2)

## 2020-11-26 LAB — BRAIN NATRIURETIC PEPTIDE: B Natriuretic Peptide: 77.1 pg/mL (ref 0.0–100.0)

## 2020-11-26 NOTE — ED Notes (Signed)
See triage note  Presents via EMS with nose bleed  States she developed a nosebleed about 12n today  Arrives to room with nose clamp in place  No bleeding noted at present

## 2020-11-26 NOTE — ED Provider Notes (Signed)
[facility] Emergency Department Provider Note  ____________________________________________  Time seen: Approximately 4:43 PM  I have reviewed the triage vital signs and the nursing notes.   HISTORY  Chief Complaint Epistaxis    HPI Lauren Bartlett is a 65 y.o. female who presents the emergency department complaining of epistaxis out of the left nares.  Patient has a history of recurrent epistaxis, states that she carries a nasal clip with her everywhere she goes as well as Afrin.  She states that it typically takes 15 to 20 minutes to resolve symptoms but today it lasted longer so she presented for evaluation.  Patient had a nasal clip on for 3 hours from the onset tube and we removed in the emergency department.  She denied any postnasal bleeding.  Again has been wearing the nasal clip for 3 hours.  She used Afrin prior to arrival.  No trauma to the face.  She states that she has felt a little bit more weak and a little bit more short of breath than normal.  She does have chronic shortness of breath chronic cough and states that she uses oxygen at nighttime.  Patient does not wear self though she does have a history of CHF but does not endorse any significant weight gain or increase in peripheral edema.  No chest pain.  Patient with no fevers, chills, nasal congestion or sore throat.       Past Medical History:  Diagnosis Date   Anxiety    Bleeding from the nose    Chronic diastolic congestive heart failure ([place])    a. 03/2016 Echo: EF 55-60%, Gr2 DD; b. 08/2016 Echo: EF 60-65%, nl diast fxn; c. 01/2019 Echo: EF 55-60%. Nl RV fxn.   Congenital absence of one kidney    COPD (chronic obstructive pulmonary disease) ([place])    a. 02/2017 PFT: mod/sev obstructive airway dzs w/ significant bronchodilator response.   Dep[facility]    [facility]   Dyspnea    Emphysema/COPD [facility])    GERD (gastroesophageal reflux disease)    History of cardiac cath    a.  05/2016 Cath: nl cors.   History of sciatica    Hypertension    Hypothyroidism    Mitral Valve Prolapse & Severe mitral regurgitation s/p MVR    a. 03/2016 Echo: severe MVP involving the posterior leaflet, sev MR;  b. 06/2016 min invasive MV repair w/ triangular resection of flail segment (P2) of posterior leaflet, gore-tex neochord placement x 4, Sorin Memo 3D rin Annulosplasty (28m, catalog # [id number] ser # [id number]; c. 08/2016 Echo: MV area 2.06 cm^2 (pressure 1/2 time); d. 01/2019 Echo: Mild MS w/ mean grad 841mg.   Motion sickness    cars   Pneumonia    PONV (postoperative nausea and vomiting)    Post-op Afib    a. 06/2016 following MV repair-->short-course amio. Coumadin d/c'd 2/2 epistaxis.   [place]   a. 08/2016 Echo: PASP 5429m; b. 01/2019 Echo: RVSP 45.3mm29m   S/P patent foramen ovale closure    a. 06/2016 @ time of MV Repair.   Sleep apnea    O2 at night and PRN    Patient Active Problem List   Diagnosis Date Noted   Aortic atherosclerosis (HCC)[place]/19/2022   Recurrent epistaxis 06/16/2020   PAF (paroxysmal atrial fibrillation) (HCC)[place]/19/2020   Nocturnal hypoxemia due to obstructive chronic bronchitis (HCC)[place]/11/2017   Encounter for therapeutic drug monitoring 08/13/2016   S/P minimally invasive  mitral valve repair 07/17/2016   S/P patent foramen ovale closure 07/17/2016   Chronic diastolic congestive heart failure (HCC)    COPD (chronic obstructive pulmonary disease) ([place]) 05/19/2016   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   Vitamin D deficiency 01/11/2015   Allergic rhinitis 07/09/2014   S/P hysterectomy with oophorectomy 09/22/2013   Pulmonary hypertension ([place])    Hypocalcemia 09/10/2012   Hypothyroidism 05/04/2012   Obesity (BMI 30-39.9) 08/04/2011   Hypertension    Congenital absence of one kidney    History of sciatica     Past Surgical History:  Procedure Laterality Date   ABDOMINAL HYSTERECTOMY     BREAST BIOPSY Right 2011   [facility]< benign    CATARACT EXTRACTION W/PHACO Left 11/14/2015   Procedure: CATARACT EXTRACTION PHACO AND INTRAOCULAR LENS PLACEMENT ([place]);  Surgeon: Leandrew Koyanagi, MD;  Location: [place];  Service: Ophthalmology;  Laterality: Left;  sleep apnea [place]   CATARACT EXTRACTION W/PHACO Right 12/19/2015   Procedure: CATARACT EXTRACTION PHACO AND INTRAOCULAR LENS PLACEMENT (I[facility]);  Surgeon: Leandrew Koyanagi, MD;  Location: [place];  Service: Ophthalmology;  Laterality: Right;  ANXIETY GENEROUS IV SEDATION TORIC LEN   COMBINED HYSTERECTOMY ABDOMINAL W/ A&P REPAIR / OOPHORECTOMY  1996   benign tumor   INNER EAR SURGERY     bilateral   MITRAL VALVE REPAIR Right 07/17/2016   Procedure: MINIMALLY INVASIVE MITRAL VALVE REPAIR (MVR) USING 26 SORIN MEMO 3D ANNULOPLASTY RING;  Surgeon: Rexene Alberts, MD;  Location: [place];  Service: Open Heart Surgery;  Laterality: Right;   PATENT FORAMEN OVALE(PFO) CLOSURE N/A 07/17/2016   Procedure: PATENT FORAMEN OVALE (PFO) CLOSURE;  Surgeon: Rexene Alberts, MD;  Location: [place];  Service: Open Heart Surgery;  Laterality: N/A;   RIGHT/LEFT HEART CATH AND CORONARY ANGIOGRAPHY Bilateral 06/16/2016   Procedure: Right/Left Heart Cath and Coronary Angiography;  Surgeon: Wellington Hampshire, MD;  Location: [place] CV [facility];  Service: Cardiovascular;  Laterality: Bilateral;   TEE WITHOUT CARDIOVERSION N/A 05/19/2016   Procedure: TRANSESOPHAGEAL ECHOCARDIOGRAM (TEE);  Surgeon: Wellington Hampshire, MD;  Location: [facility];  Service: Cardiovascular;  Laterality: N/A;   TEE WITHOUT CARDIOVERSION N/A 06/09/2016   Procedure: TRANSESOPHAGEAL ECHOCARDIOGRAM (TEE);  Surgeon: Wellington Hampshire, MD;  Location: [facility];  Service: Cardiovascular;  Laterality: N/A;   TEE WITHOUT CARDIOVERSION N/A 06/16/2016   Procedure: TRANSESOPHAGEAL ECHOCARDIOGRAM (TEE);  Surgeon: Wellington Hampshire, MD;  Location: [facility];  Service: Cardiovascular;  Laterality: N/A;   TEE WITHOUT CARDIOVERSION N/A  07/17/2016   Procedure: TRANSESOPHAGEAL ECHOCARDIOGRAM (TEE);  Surgeon: Rexene Alberts, MD;  Location: [place];  Service: Open Heart Surgery;  Laterality: N/A;   TONSILLECTOMY      Prior to Admission medications   Medication Sig Start Date End Date Taking? Authorizing Provider  acetaminophen (TYLENOL) 500 MG tablet Take 500 mg by mouth 2 (two) times daily as needed for moderate pain or headache.    [provider]  albuterol (PROVENTIL) (2.5 MG/3ML) 0.083% nebulizer solution INHALE THE CONTENTS OF 1 VIAL VIA NEBULIZER EVERY 6 HOURS AS NEEDED FOR WHEEZING  OR FOR SHORTNESS OF BREATH 08/03/20   [name], MD  albuterol (VENTOLIN HFA) 108 (90 Base) MCG/ACT inhaler INHALE 2 PUFFS INTO THE LUNGS EVERY 6 HOURS AS NEEDED FOR WHEEZING OR SHORTNESS OF BREATH (USE WITH SPACER) 10/16/20   [name], MD  aspirin EC 81 MG EC tablet Take 1 tablet (81 mg total) by mouth daily. 07/23/16   Barrett, Lodema Hong, PA-C  Calcium Carb-Cholecalciferol 600-800 MG-UNIT TABS Take 1 tablet by mouth in the morning, at noon, and at bedtime.     [provider]  docusate sodium (COLACE) 100 MG capsule Take 100 mg by mouth daily.    [provider]  famotidine (PEPCID AC) 10 MG chewable tablet Chew 20 mg by mouth daily as needed for heartburn.     [provider]  ferrous sulfate 325 (65 FE) MG tablet Take 325 mg by mouth daily with breakfast.    [provider]  FLOVENT HFA 110 MCG/ACT inhaler INHALE 2 PUFFS TWICE DAILY 07/18/20   [name], MD  furosemide (LASIX) 20 MG tablet TAKE 1 TABLET EVERY DAY . MAY TAKE AN ADDITIONAL TABLET AS NEEDED FOR WEIGHT GAIN OF 2 LBS OVERNIGHT OR 5 LBS IN ONE WEEK 11/06/20   Marrianne Mood D, PA-C  levothyroxine (SYNTHROID) 150 MCG tablet TAKE 1 TABLET EVERY DAY BEFORE BREAKFAST (INCREASED DOSE) 11/07/20   [name], MD  loratadine (CLARITIN) 10 MG tablet Take 10 mg by mouth daily.    [provider]  losartan (COZAAR) 50 MG  tablet TAKE 1 TABLET EVERY DAY 09/27/20   [name], MD  OXYGEN Inhale 2 L into the lungs at bedtime as needed (at bedtime and as needed).    [provider]  oxymetazoline (AFRIN) 0.05 % nasal spray Place 1 spray into both nostrils 2 (two) times daily. As needed for nose bleed    [provider]  polyethylene glycol powder (GLYCOLAX/MIRALAX) powder Take 17 g by mouth 2 (two) times daily as needed. 08/03/15   [name], MD  STIOLTO RESPIMAT 2.5-2.5 MCG/ACT AERS INHALE  2  PUFFS  INTO  THE  LUNGS EVERY DAY 07/24/20   [name], MD  traMADol (ULTRAM) 50 MG tablet Take 1-2 tablets (50-100 mg total) by mouth every 4 (four) hours as needed for moderate pain. 08/21/16   [name], MD  vitamin B-12 (CYANOCOBALAMIN) 1000 MCG tablet Take 1 tablet (1,000 mcg total) by mouth daily. 08/14/16   [name], MD  Vitamin D, Ergocalciferol, (DRISDOL) 1.25 MG (50000 UNIT) CAPS capsule TAKE 1 CAPSULE (50,000 UNITS TOTAL) BY MOUTH ONCE A WEEK. 10/04/20   [name], MD    Allergies Codeine, Levaquin [levofloxacin], Penicillins, Zithromax [azithromycin], and Prednisone  Family History  Problem Relation Age of Onset   Multiple sclerosis Mother    Hypertension Mother    Coronary artery disease Father    Heart disease Father    Hypertension Father    Heart disease Brother    Cancer Brother    Breast cancer Paternal Aunt     Social History Social History   Tobacco Use   Smoking status: Never   Smokeless tobacco: Never  Vaping Use   Vaping Use: Never used  Substance Use Topics   Alcohol use: No   Drug use: No     Review of Systems  Constitutional: No fever/chills.  Increased weakness from baseline Eyes: No visual changes. No discharge ENT: Left-sided epistaxis Cardiovascular: no chest pain. Respiratory: Increased chronic cough and increased shortness of breath. Gastrointestinal: No abdominal pain.  No nausea, no vomiting.  No diarrhea.  No  constipation. Musculoskeletal: Negative for musculoskeletal pain. Skin: Negative for rash, abrasions, lacerations, ecchymosis. Neurological: Negative for headaches, focal weakness or numbness.  10 System ROS otherwise negative.  ____________________________________________   PHYSICAL EXAM:  VITAL SIGNS: ED Triage Vitals [11/26/20 1417]  Enc Vitals Group  BP 135/68     Pulse Rate 92     Resp 20     Temp 98.7 F (37.1 C)     Temp Source Oral     SpO2 97 %     Weight      Height      Head Circumference      Peak Flow      Pain Score 5     Pain Loc      Pain Edu?      Excl. in [place]?      Constitutional: Alert and oriented. Well appearing and in no acute distress. Eyes: Conjunctivae are normal. PERRL. EOMI. Head: Atraumatic. ENT:      Ears:       Nose: No congestion/rhinnorhea.  Visualization of the bilateral nares reveals no active epistaxis at the left nares.  There is dried blood noted.  No visible signs of trauma to the nose.  It appears that this was likely a posterior nosebleed.  No active bleeding at this time.      Mouth/Throat: Mucous membranes are moist.  Neck: No stridor.    Cardiovascular: Normal rate, regular rhythm. Normal S1 and S2.  Good peripheral circulation.  Patient with peripheral edema in the lower extremities, appears chronic with no cellulitic changes.  This is nonpitting Respiratory: Normal respiratory effort without tachypnea or retractions. Lungs CTAB specifically no wheezing, rales or rhonchi. Good air entry to the bases with no decreased or absent breath sounds. Musculoskeletal: Full range of motion to all extremities. No gross deformities appreciated. Neurologic:  Normal speech and language. No gross focal neurologic deficits are appreciated.  Skin:  Skin is warm, dry and intact. No rash noted. Psychiatric: Mood and affect are normal. Speech and behavior are normal. Patient exhibits appropriate insight and  judgement.   ____________________________________________   LABS (all labs ordered are listed, but only abnormal results are displayed)  Labs Reviewed  COMPREHENSIVE METABOLIC PANEL - Abnormal; Notable for the following components:      Result Value   Calcium 8.0 (*)    All other components within normal limits  CBC WITH DIFFERENTIAL/PLATELET  BRAIN NATRIURETIC PEPTIDE   ____________________________________________  EKG   ____________________________________________  RADIOLOGY   DG Chest 2 View  Result Date: 11/26/2020 CLINICAL DATA:  Shortness of breath, chronic cough EXAM: CHEST - 2 VIEW COMPARISON:  None. FINDINGS: Mild cardiomegaly. Both lungs are clear. Disc degenerative disease the thoracic spine. IMPRESSION: Mild cardiomegaly without acute abnormality of the lungs. Electronically Signed   By: Eddie Candle M.D.   On: 11/26/2020 17:41    ____________________________________________    PROCEDURES  Procedure(s) performed:    Procedures    Medications - No data to display   ____________________________________________   INITIAL IMPRESSION / ASSESSMENT AND PLAN / ED COURSE  Pertinent labs & imaging results that were available during my care of the patient were reviewed by me and considered in my medical decision making (see chart for details).  Review of the [place] CSRS was performed in accordance of the [place] prior to dispensing any controlled drugs.           Patient's diagnosis is consistent with epistaxis.  Patient presented to the emergency department complaining of epistaxis earlier today.  Patient had no trauma.  She does have a history of recurrent epistaxis.  No bleeding currently patient had used Afrin and a nasal clip prior to arrival.  Basic labs were checked and patient had stated that she had felt  a little more short of breath than normal and chest x-ray was also performed.  No evidence of consolidation concerning for pneumonia.  No evidence of  bronchitis.  No increased fluid burden.  This point patient is stable for discharge.  We have discussed the likelihood of return of epistaxis over the next several days.  Patient knows how to treat same at home.  If bleeding continues that she cannot control she will return to the emergency department.  There is no evidence for packing currently.  No indication for follow-up with ENT currently for epistaxis unless this recurs. Patient is given ED precautions to return to the ED for any worsening or new symptoms.     ____________________________________________  FINAL CLINICAL IMPRESSION(S) / ED DIAGNOSES  Final diagnoses:  Epistaxis      NEW MEDICATIONS STARTED DURING THIS VISIT:  ED Discharge Orders     None           This chart was dictated using voice recognition software/Dragon. Despite best efforts to proofread, errors can occur which can change the meaning. Any change was purely unintentional.    Darletta Moll, PA-C 11/26/20 [place] May, MD 11/26/20 [phone]

## 2020-11-26 NOTE — ED Triage Notes (Signed)
Pt comes via EMs from home with c/o nose bleed. Pt states she had headache around 12 noon, pt states she then had the nose bleed around 1230. Clamp in place bleeding controlled. No blood thinners. VSS  Pt denies any other complaints.

## 2020-12-06 DIAGNOSIS — J449 Chronic obstructive pulmonary disease, unspecified: Secondary | ICD-10-CM | POA: Diagnosis not present

## 2020-12-11 ENCOUNTER — Other Ambulatory Visit: Payer: Self-pay

## 2020-12-11 ENCOUNTER — Telehealth (INDEPENDENT_AMBULATORY_CARE_PROVIDER_SITE_OTHER): Payer: Medicare HMO | Admitting: Physician Assistant

## 2020-12-11 ENCOUNTER — Encounter: Payer: Self-pay | Admitting: Physician Assistant

## 2020-12-11 VITALS — BP 127/79 | HR 89 | Ht 62.0 in | Wt 250.0 lb

## 2020-12-11 DIAGNOSIS — J449 Chronic obstructive pulmonary disease, unspecified: Secondary | ICD-10-CM

## 2020-12-11 DIAGNOSIS — I2721 Secondary pulmonary arterial hypertension: Secondary | ICD-10-CM

## 2020-12-11 DIAGNOSIS — I10 Essential (primary) hypertension: Secondary | ICD-10-CM | POA: Diagnosis not present

## 2020-12-11 DIAGNOSIS — I5032 Chronic diastolic (congestive) heart failure: Secondary | ICD-10-CM

## 2020-12-11 DIAGNOSIS — I050 Rheumatic mitral stenosis: Secondary | ICD-10-CM | POA: Diagnosis not present

## 2020-12-11 NOTE — Progress Notes (Signed)
Virtual Visit via Telephone Note   This visit type was conducted due to national recommendations for restrictions regarding the COVID-19 Pandemic (e.g. social distancing) in an effort to limit this patient's exposure and mitigate transmission in our community.  Due to her co-morbid illnesses, this patient is at least at moderate risk for complications without adequate follow up.  This format is felt to be most appropriate for this patient at this time.  The patient did not have access to video technology/had technical difficulties with video requiring transitioning to audio format only (telephone).  All issues noted in this document were discussed and addressed.  No physical exam could be performed with this format.  Please refer to the patient's chart for her  consent to telehealth for [facility].   Date:  12/11/2020   ID:  Lauren Bartlett, DOB Dec 14, 1955, MRN [id number]  Patient Location: Home Provider Location: Office/Clinic  PCP:  [name], MD  Cardiologist:  Kathlyn Sacramento, MD  Electrophysiologist:  None   Evaluation Performed:  Follow-Up Visit  Chief Complaint:  65 y.o. female with history of mitral valve prolapse and severe mitral regurgitation s/p mitral valve repair (April 2018), hypertension, HFpEF, COPD, pulmonary arterial HTN, congenital solitary kidney, hypothyroidism, and who is contacted via telephone only for 1 month follow-up.  Chief Complaint  Patient presents with   Other    1 month f/u no complaints today. Meds reviewed verbally with pt.     History of Present Illness:    HELVI SRADER is a 65 y.o. female with history of mitral valve prolapse and severe MR s/p mitral valve repair 06/2016, hypertension, HFpEF, COPD, pulmonary arterial hypertension, congenital solitary kidney, hypothyroidism, and who is seen today via telephone only visit for follow-up of hypertension and valvular/cardiac disease.  She has a history of mitral valve disease s/p repair due to  mitral valve prolapse and severe MR 06/2016.  Catheterization prior to valvular repair revealed normal cors.  Echo 08/2016 showed normal LVEF, mild to moderate TR, PAH with PASP 54 mmHg.  01/2019 echo showed normal LV SF, mild mitral stenosis, RVSP 45.3 mmHg.  She had multiple ED visits in February 2021 due to dyspnea and treated with Solu-Medrol.  She was seen in our clinic with increasing dyspnea and Lasix increased to 40 mg daily for 1 week due to fluid retention, suspected 2/2 recent steroids.  She was admitted 06/09/2019 to 06/11/2019 for COPD exacerbation and possible exacerbation of HFpEF.  She was treated with steroids, antibiotics, and IV diuresed with improvement in symptoms.  She was discharged with supplemental oxygen.  She was seen via telemedicine 10/06/2019 and reportedly felt well from a cardiac standpoint.  She reported stable dyspnea on exertion.  She had some mild wheezing, attributed to her COPD, and reportedly at her baseline.  She also reported a longstanding / unchanged /chronic dry cough.  She had not recently used her albuterol.  She was trying to be more active.  She reported palpitations that woke her from sleep/dreaming.  She felt as if they may be 2/2 her dream.  No reported daytime palpitations.  She reported mild ankle edema.  She did feel as if she was eating more sodium over the weekend with weight stable.  She reported obtaining low-sodium meals through [employer].  She intended to elevate her feet more often.  Recommendations were for continued Lasix 20 mg daily with extra tablet as needed for weight gain of 2 pounds overnight or 5 pounds  in 1 week.  She was advised to continue to follow-up with pulmonology as directed.  Contacted via telemedicine 04/09/2020 and noted stable dyspnea on exertion.  She had dry cough with some wheezing.  She noted some episodes of waking overnight, attributed to bad dreams.  These wakings were usually associated with racing heart rate.  She was  walking around her home for activity, estimating 1 mile per day.  She could not walk as far she used to in the past and was more tired easily.  She was eager to start walking outside but wanted to wait until it was warmer.  She had an aide at home.  She was receiving low-salt and heart healthy [employer] Whe[employer].  She noted periodic swelling in her ankles, improving with elevation.  She had some nosebleeds, attributed to dry air.  She was sleeping with a humidifier. Home SBP 120s.  She was sleeping with 2-3 pillows.she was taking Lasix 20 mg daily and monitoring her weight.   07/24/2020 echo with stable findings from 2020 and showing EF 55 to 60%.  Images were suboptimal to evaluate for R WMA or diastolic parameters.  There is mildly elevated PASP.  Mild LAE.  Moderate MS was noted with mean mitral valve gradient 8.0 mmHg.  A prosthetic angioplasty ring was present in the mitral position.  Aortic valve not well visualized but appeared to be functioning well.  RAP 8 mmHg.  Telemedicine visit 11/05/2020 with report of shortness of breath, dyspnea and inconsistent chest pain with exertion, fatigue, weight gain, PND, racing heart rate, and chronic orthopnea.  She attributed her symptoms to the current heat and reported being very busy and running around all that weekend with a bridal shower and church.  She had recently gained weight, which she attributed to caloric intake.  She was using 3 pillows.  She had recent PND, waking with shortness of breath, which was new for her.  She continues to wake with racing heart rate.  She did report to sleep study is negative for apnea.  She was taking Lasix 20 mg every day.  Increased diuresis discussed with patient preference to defer.  She was relatively sedentary other than running errands, given she reported it too warm to walk outside.  She was walking up and down the halls of her home, as well as at the grocery store.  She noted some inconsistent chest pain or shortness of  breath with this activity with recommendation for in person visit at follow-up within the next few weeks.  Today, 12/11/2020, her in person clinic visit has been switched again to telemedicine.  She reports feeling significantly improved from her 11/05/2020 visit.  Her previously reported inconsistent chest pain and dyspnea have improved/resolved since her previous visit.  She reports new fatigue, especially noted 9/12, and with unclear etiology.  She still feels her earlier symptoms on 11/05/2020 were due to a busy weekend in the heat with higher caloric intake.  Since her last visit, she denies any weight gain or loss- she reports her weight is about the same as previous at 250 pounds.  BP 127/79.  Unchanged 3 pillow orthopnea.  She denies taking any additional Lasix between visits. She denies any recent tachypalpitations or PND.  No presyncope, syncope.  Stable LEE reported.  No abdominal distention or early satiety.  She remains relatively sedentary but continues to walk up and down the hall for activity.  She is still avoiding going outside due to the heat.  Salt  and fluid restrictions reviewed, as well as her current diet.  She reports an upcoming trip to the beach with her church, and she is very excited for this trip.  She will work to avoid salt when eating out, though she notes that they are planning to go to the grocery store and eat in as much as possible.  The patient does not have symptoms concerning for COVID-19 infection (fever, chills, cough, or new shortness of breath).   Past Medical History:  Diagnosis Date   Anxiety    Bleeding from the nose    Chronic diastolic congestive heart failure ([place])    a. 03/2016 Echo: EF 55-60%, Gr2 DD; b. 08/2016 Echo: EF 60-65%, nl diast fxn; c. 01/2019 Echo: EF 55-60%. Nl RV fxn.   Congenital absence of one kidney    COPD (chronic obstructive pulmonary disease) ([place])    a. 02/2017 PFT: mod/sev obstructive airway dzs w/ significant bronchodilator response.    Depression    [facility]   Dyspnea    Emphysema/COPD [facility])    GERD (gastroesophageal reflux disease)    History of cardiac cath    a. 05/2016 Cath: nl cors.   History of sciatica    Hypertension    Hypothyroidism    Mitral Valve Prolapse & Severe mitral regurgitation s/p MVR    a. 03/2016 Echo: severe MVP involving the posterior leaflet, sev MR;  b. 06/2016 min invasive MV repair w/ triangular resection of flail segment (P2) of posterior leaflet, gore-tex neochord placement x 4, Sorin Memo 3D rin Annulosplasty (61m, catalog # [id number] ser # [id number]; c. 08/2016 Echo: MV area 2.06 cm^2 (pressure 1/2 time); d. 01/2019 Echo: Mild MS w/ mean grad 834mg.   Motion sickness    cars   Pneumonia    PONV (postoperative nausea and vomiting)    Post-op Afib    a. 06/2016 following MV repair-->short-course amio. Coumadin d/c'd 2/2 epistaxis.   Pulmonary hypertension ([facility]. [place]   a. 08/2016 Echo: PASP 5451m; b. 01/2019 Echo: RVSP 45.3mm41m   S/P patent foramen ovale closure    a. 06/2016 @ time of MV Repair.   Sleep apnea    O2 at night and PRN   Past Surgical History:  Procedure Laterality Date   ABDOMINAL HYSTERECTOMY     BREAST BIOPSY Right 2011   [facility]< benign   CATARACT EXTRACTION W/PHACO Left 11/14/2015   Procedure: CATARACT EXTRACTION PHACO AND INTRAOCULAR LENS PLACEMENT (IOC);  Surgeon: ChadLeandrew Koyanagi;  [facility];  Laterality: Left;  sleep apnea Tor[place]   CATARACT EXTRACTION W/PHACO Right 12/19/2015   Procedure: CATARACT EXTRACTION PHACO AND INTRAOCULAR LENS PLACEMENT (IOC);  Surgeon: ChadLeandrew Koyanagi;  Location: [facility]: Ophthalmology;  Laterality: Right;  ANXIETY GENEROUS IV SEDATION TORIC LEN   COMBINED HYSTERECTOMY ABDOMINAL W/ A&P REPAIR / OOPHORECTOMY  1996   benign tumor   INNER EAR SURGERY     bilateral   MITRAL VALVE REPAIR Right 07/17/2016   Procedure: MINIMALLY INVASIVE MITRAL VALVE REPAIR  (MVR) USING 26 SORIN MEMO 3D ANNULOPLASTY RING;  Surgeon: ClarRexene Alberts;  Location: [facility]ervice: Open Heart Surgery;  Laterality: Right;   PATENT FORAMEN OVALE(PFO) CLOSURE N/A 07/17/2016   Procedure: PATENT FORAMEN OVALE (PFO) CLOSURE;  Surgeon: ClarRexene Alberts;  Location: [facility]omaervice: Open Heart Surgery;  Laterality: N/A;   RIGHT/LEFT HEART CATH AND CORONARY ANGIOGRAPHY Bilateral 06/16/2016   Procedure: Right/Left Heart  Cath and Coronary Angiography;  Surgeon: Wellington Hampshire, MD;  Location: [facility];  Service: Cardiovascular;  Laterality: Bilateral;   TEE WITHOUT CARDIOVERSION N/A 05/19/2016   Procedure: TRANSESOPHAGEAL ECHOCARDIOGRAM (TEE);  Surgeon: Wellington Hampshire, MD;  Location: [facility] OR[facility];  Service: Cardiovascular;  Laterality: N/A;   TEE WITHOUT CARDIOVERSION N/A 06/09/2016   Procedure: TRANSESOPHAGEAL ECHOCARDIOGRAM (TEE);  Surgeon: Wellington Hampshire, MD;  Location: [facility] ORS;  Service: Cardiovascular;  Laterality: N/A;   TEE WITHOUT CARDIOVERSION N/A 06/16/2016   Procedure: TRANSESOPHAGEAL ECHOCARDIOGRAM (TEE);  Surgeon: Wellington Hampshire, MD;  Location: [facility] ORS;  Service: Cardiovascular;  Laterality: N/A;   TEE WITHOUT CARDIOVERSION N/A 07/17/2016   Procedure: TRANSESOPHAGEAL ECHOCARDIOGRAM (TEE);  Surgeon: Rexene Alberts, MD;  Location: [place];  Service: Open Heart Surgery;  Laterality: N/A;   TONSILLECTOMY       Current Meds  Medication Sig   acetaminophen (TYLENOL) 500 MG tablet Take 500 mg by mouth 2 (two) times daily as needed for moderate pain or headache.   albuterol (PROVENTIL) (2.5 MG/3ML) 0.083% nebulizer solution INHALE THE CONTENTS OF 1 VIAL VIA NEBULIZER EVERY 6 HOURS AS NEEDED FOR WHEEZING  OR FOR SHORTNESS OF BREATH   albuterol (VENTOLIN HFA) 108 (90 Base) MCG/ACT inhaler INHALE 2 PUFFS INTO THE LUNGS EVERY 6 HOURS AS NEEDED FOR WHEEZING OR SHORTNESS OF BREATH (USE WITH SPACER)   aspirin EC 81 MG EC tablet Take 1 tablet (81 mg total) by mouth  daily.   Calcium Carb-Cholecalciferol 600-800 MG-UNIT TABS Take 1 tablet by mouth in the morning, at noon, and at bedtime.    docusate sodium (COLACE) 100 MG capsule Take 100 mg by mouth daily.   famotidine (PEPCID AC) 10 MG chewable tablet Chew 20 mg by mouth daily as needed for heartburn.    ferrous sulfate 325 (65 FE) MG tablet Take 325 mg by mouth daily with breakfast.   FLOVENT HFA 110 MCG/ACT inhaler INHALE 2 PUFFS TWICE DAILY   furosemide (LASIX) 20 MG tablet TAKE 1 TABLET EVERY DAY . MAY TAKE AN ADDITIONAL TABLET AS NEEDED FOR WEIGHT GAIN OF 2 LBS OVERNIGHT OR 5 LBS IN ONE WEEK   levothyroxine (SYNTHROID) 150 MCG tablet TAKE 1 TABLET EVERY DAY BEFORE BREAKFAST (INCREASED DOSE)   loratadine (CLARITIN) 10 MG tablet Take 10 mg by mouth daily.   losartan (COZAAR) 50 MG tablet TAKE 1 TABLET EVERY DAY   OXYGEN Inhale 2 L into the lungs at bedtime as needed (at bedtime and as needed).   oxymetazoline (AFRIN) 0.05 % nasal spray Place 1 spray into both nostrils 2 (two) times daily. As needed for nose bleed   polyethylene glycol powder (GLYCOLAX/MIRALAX) powder Take 17 g by mouth 2 (two) times daily as needed.   STIOLTO RESPIMAT 2.5-2.5 MCG/ACT AERS INHALE  2  PUFFS  INTO  THE  LUNGS EVERY DAY   traMADol (ULTRAM) 50 MG tablet Take 1-2 tablets (50-100 mg total) by mouth every 4 (four) hours as needed for moderate pain.   vitamin B-12 (CYANOCOBALAMIN) 1000 MCG tablet Take 1 tablet (1,000 mcg total) by mouth daily.   Vitamin D, Ergocalciferol, (DRISDOL) 1.25 MG (50000 UNIT) CAPS capsule TAKE 1 CAPSULE (50,000 UNITS TOTAL) BY MOUTH ONCE A WEEK.     Allergies:   Codeine, Levaquin [levofloxacin], Penicillins, Zithromax [azithromycin], and Prednisone   Social History   Tobacco Use   Smoking status: Never   Smokeless tobacco: Never  Vaping Use   Vaping Use: Never used  Substance  Use Topics   Alcohol use: No   Drug use: No     Family Hx: The patient's family history includes Breast cancer in  her paternal aunt; Cancer in her brother; Coronary artery disease in her father; Heart disease in her brother and father; Hypertension in her father and mother; Multiple sclerosis in her mother.  ROS:   She reports fatigue, especially noted the day prior to this visit.  She has resolution of previous inconsistent chest pain and shortness of breath with exertion.  No further PND or tachypalpitations.  She reports ongoing orthopnea, consistently 3 pillows.  She reports weight has remained the same. No nausea, vomiting, dizziness, syncope.  No early satiety.  She reports stable lower extremity edema.  All other systems reviewed and are negative.   Objective:    Vital Signs:  BP 127/79 (BP Location: Left Arm, Patient Position: Sitting, Cuff Size: Normal)   Pulse 89   Ht '5\' 2"'$  (1.575 m)   Wt 250 lb (113.4 kg)   BMI 45.73 kg/m    VITAL SIGNS:  reviewed  Accessory clinical findings reviewed:    EKG:  No ECG reviewed.  Previous vitals reviewed today:    Temp Readings from Last 3 Encounters:  11/26/20 98.7 F (37.1 C) (Oral)  06/13/20 97.9 F (36.6 C) (Oral)  03/02/20 98.5 F (36.9 C) (Oral)   BP Readings from Last 3 Encounters:  12/11/20 127/79  11/26/20 130/70  11/05/20 124/79   Pulse Readings from Last 3 Encounters:  12/11/20 89  11/26/20 88  11/05/20 93    Wt Readings from Last 3 Encounters:  12/11/20 250 lb (113.4 kg)  11/26/20 249 lb 1.9 oz (113 kg)  11/05/20 250 lb (113.4 kg)    - Labs reviewed today:     Lab Results  Component Value Date   WBC 8.4 11/26/2020   HGB 14.8 11/26/2020   HCT 44.7 11/26/2020   MCV 94.7 11/26/2020   PLT 167 11/26/2020   Lab Results  Component Value Date   CREATININE 0.88 11/26/2020   BUN 21 11/26/2020   NA 139 11/26/2020   K 4.1 11/26/2020   CL 102 11/26/2020   CO2 28 11/26/2020   Lab Results  Component Value Date   ALT 30 11/26/2020   AST 27 11/26/2020   ALKPHOS 79 11/26/2020   BILITOT 0.6 11/26/2020   Lab Results   Component Value Date   CHOL 179 06/13/2020   HDL 60.60 06/13/2020   LDLCALC 102 (H) 06/13/2020   LDLDIRECT 101.0 07/30/2017   TRIG 82.0 06/13/2020   CHOLHDL 3 06/13/2020    Lab Results  Component Value Date   HGBA1C 5.5 01/26/2019   Lab Results  Component Value Date   TSH 1.03 06/13/2020     Prior CV Studies reviewed today:    The following studies were reviewed today:  Echo 07/24/2020 1. Left ventricular ejection fraction, by estimation, is 55 to 60%. The  left ventricle has normal function. Left ventricular endocardial border  not optimally defined to evaluate regional wall motion. Left ventricular  diastolic parameters are  indeterminate.   2. Right ventricular systolic function is normal. The right ventricular  size is normal. There is mildly elevated pulmonary artery systolic  pressure.   3. Left atrial size was mildly dilated.   4. The mitral valve was not well visualized. No evidence of mitral valve  regurgitation. Moderate mitral stenosis. The mean mitral valve gradient is  8.0 mmHg. There is a prosthetic annuloplasty ring  present in the mitral  position. Procedure Date: 06/2016.   5. The aortic valve was not well visualized. Aortic valve regurgitation  is not visualized. No aortic stenosis is present.   6. The inferior vena cava is normal in size with <50% respiratory  variability, suggesting right atrial pressure of 8 mmHg.   [place] LE 05/2019 IMPRESSION: No femoropopliteal DVT nor evidence of DVT within the visualized calf veins.  Echo 01/2019  1. Left ventricular ejection fraction, by visual estimation, is 55 to  60%. The left ventricle has normal function. There is mildly increased  left ventricular hypertrophy.   2. Global right ventricle has normal systolic function.The right  ventricular size is normal. No increase in right ventricular wall  thickness.   3. The mitral valve has been repaired/replaced. No evidence of mitral  valve regurgitation. Mild  mitral stenosis with mean gradient of 8 mm Hg.   4. The tricuspid valve is normal in structure. Tricuspid valve  regurgitation is trivial.   5. The aortic valve is normal in structure. Aortic valve regurgitation is  not visualized. No evidence of aortic valve sclerosis or stenosis.   6. Moderately elevated pulmonary artery systolic pressure.   7. The inferior vena cava is dilated in size with >50% respiratory  variability, suggesting right atrial pressure of 8 mmHg.   8. The tricuspid regurgitant velocity is 3.05 m/s, and with an assumed  right atrial pressure of 8 mmHg, the estimated right ventricular systolic  pressure is moderately elevated at 45.3 mmHg.   [facility] 05/2016 The left ventricular systolic function is normal. LV end diastolic pressure is normal. The left ventricular ejection fraction is 55-65% by visual estimate. There is severe (4+) mitral regurgitation. 1. Normal coronary arteries. Catheter-induced spasm in the ostial right coronary artery with no significant pressure dampening. This improved with catheter pullback. 2. Normal LV systolic function. 3. Severe mitral regurgitation by left ventricular angiography with dilated left atrium. 4. Right heart catheterization showed mildly elevated filling pressures, prominent V wave on pulmonary capillary wedge pressure, moderate pulmonary hypertension and high cardiac output. RA pressure: 6 mmHg, RV pressure: 53/7, PA pressure: 52/15 with a mean of 35 mmHg, pulmonary capillary wedge pressure: 17 mmHg. Cardiac output was 6.9 with a cardiac index of 3.5 Recommendations: Recommend evaluation for mitral valve repair. The patient needs to see pulmonary in order to make sure she is a candidate.   ASSESSMENT & PLAN:    Chronic HFpEF Pulmonary arterial hypertension -- Reports improvement in previous inconsistent symptoms of CP/SOB/DOE with exertion, though does note new fatigue. Recent echo as above.  Continue Lasix at 20 mg daily with  additional 1 tablet for weight gain of 2lbs overnight or 5lbs in a week. Continue daily weights, BP checks.  Continue to monitor fluid intake and salt intake.  Recommend increase activity as tolerated. In person visit recommended at next follow-up to allow for EKG/labs/in person vitals and physical exam.    COPD -- Continue current inhalers as directed.  Continue to follow with pulmonology as directed. Number for pulmonology provided today.  Essential hypertension --BP well controlled.  Continue current medications.  We discussed avoiding salt at the beach/salt and fluid restrictions with patient understanding..   S/p mitral valve repair --Recent echo with moderate MS and MV gradient 8.47mHg.  Recommend continue to monitor with periodic echo. Ongoing heart rate and blood pressure control recommended.  COVID-19 Education: The signs and symptoms of COVID-19 were discussed with the patient and how to seek care  for testing (follow up with PCP or arrange E-visit).  The importance of social distancing was discussed today.  Time:   Today, I have spent 30 minutes total in reviewing the pt chart and with the patient and telehealth technology discussing the above problems.     Medication Adjustments/Labs and Tests Ordered: Current medicines are reviewed at length with the patient today.  Concerns regarding medicines are outlined above.   Disposition: No medication changes or further work-up at this time.  RTC in person at next follow-up / after the beach  Signed, Magda Bernheim  12/11/2020  [place]

## 2020-12-11 NOTE — Patient Instructions (Signed)
Medication Instructions:   Your physician recommends that you continue on your current medications as directed. Please refer to the Current Medication list given to you today.  *If you need a refill on your cardiac medications before your next appointment, please call your pharmacy*   Lab Work:  None ordered  Testing/Procedures:  None ordered   Follow-Up: At [facility], you and your health needs are our priority.  As part of our continuing mission to provide you with exceptional heart care, we have created designated Provider Care Teams.  These Care Teams include your primary Cardiologist (physician) and Advanced Practice Providers (APPs -  Physician Assistants and Nurse Practitioners) who all work together to provide you with the care you need, when you need it.  We recommend signing up for the patient portal called "MyChart".  Sign up information is provided on this After Visit Summary.  MyChart is used to connect with patients for Virtual Visits (Telemedicine).  Patients are able to view lab/test results, encounter notes, upcoming appointments, etc.  Non-urgent messages can be sent to your provider as well.   To learn more about what you can do with MyChart, go to [email or web address].    Your next appointment:   6 month(s)  The format for your next appointment:   In Person  Provider:   You may see Kathlyn Sacramento, MD or one of the following Advanced Practice Providers on your designated Care Team:   Murray Hodgkins, NP Christell Faith, PA-C Marrianne Mood, PA-C Cadence Sopchoppy, [place]

## 2020-12-13 ENCOUNTER — Encounter: Payer: Self-pay | Admitting: Physician Assistant

## 2020-12-20 ENCOUNTER — Other Ambulatory Visit: Payer: Self-pay

## 2020-12-20 ENCOUNTER — Ambulatory Visit (INDEPENDENT_AMBULATORY_CARE_PROVIDER_SITE_OTHER): Payer: Medicare HMO | Admitting: Internal Medicine

## 2020-12-20 ENCOUNTER — Encounter: Payer: Self-pay | Admitting: Internal Medicine

## 2020-12-20 VITALS — BP 122/80 | HR 89 | Temp 96.6°F | Ht 62.0 in | Wt 250.8 lb

## 2020-12-20 DIAGNOSIS — I480 Paroxysmal atrial fibrillation: Secondary | ICD-10-CM | POA: Diagnosis not present

## 2020-12-20 DIAGNOSIS — E034 Atrophy of thyroid (acquired): Secondary | ICD-10-CM | POA: Diagnosis not present

## 2020-12-20 DIAGNOSIS — R040 Epistaxis: Secondary | ICD-10-CM | POA: Diagnosis not present

## 2020-12-20 DIAGNOSIS — Z23 Encounter for immunization: Secondary | ICD-10-CM

## 2020-12-20 DIAGNOSIS — J42 Unspecified chronic bronchitis: Secondary | ICD-10-CM | POA: Diagnosis not present

## 2020-12-20 DIAGNOSIS — I10 Essential (primary) hypertension: Secondary | ICD-10-CM

## 2020-12-20 LAB — BASIC METABOLIC PANEL
BUN: 19 mg/dL (ref 6–23)
CO2: 32 mEq/L (ref 19–32)
Calcium: 8.3 mg/dL — ABNORMAL LOW (ref 8.4–10.5)
Chloride: 99 mEq/L (ref 96–112)
Creatinine, Ser: 0.97 mg/dL (ref 0.40–1.20)
GFR: 61.52 mL/min (ref >60.00)
Glucose, Bld: 76 mg/dL (ref 70–99)
Potassium: 4.5 mEq/L (ref 3.5–5.1)
Sodium: 141 mEq/L (ref 135–145)

## 2020-12-20 LAB — TSH: TSH: 0.95 u[IU]/mL (ref 0.35–5.50)

## 2020-12-20 LAB — HEMOGLOBIN A1C: Hgb A1c MFr Bld: 5.6 % (ref 4.6–6.5)

## 2020-12-20 NOTE — Progress Notes (Signed)
Subjective:  Patient ID: Lauren Bartlett, female    DOB: 1955-08-09  Age: 65 y.o. MRN: [id number]  CC: The primary encounter diagnosis was Hypothyroidism due to acquired atrophy of thyroid. Diagnoses of Hypocalcemia, Morbid obesity ([place]), Primary hypertension, PAF (paroxysmal atrial fibrillation) ([place]), Chronic bronchitis, unspecified chronic bronchitis type ([place]), Need for immunization against influenza, and Recurrent epistaxis were also pertinent to this visit.  HPI AQUILLA SHAMBLEY presents for  Chief Complaint  Patient presents with   Follow-up    Follow up on hypothyroidism, hypertension   Hypertension: patient checks blood pressure twice weekly at home.  Readings have been < 140/80 at rest . Patient is following a reduced salt diet most days and is taking medications as prescribed : losartan 50 m daily   .hypothyroidism:  taking levothyroxine  150 mcg (higher dose ) since August 10   Sneezing . Scratchy throat for the last 2 days,  no body aches,  taking claritin once daily.  Had another  episode of epistaxis stopped by ER physician  no packing needed, just pinched and iced it.    Wears supplemental oxygen at night,  using AYR saline gel and mist once daily  per ENT    Outpatient Medications Prior to Visit  Medication Sig Dispense Refill   acetaminophen (TYLENOL) 500 MG tablet Take 500 mg by mouth 2 (two) times daily as needed for moderate pain or headache.     albuterol (PROVENTIL) (2.5 MG/3ML) 0.083% nebulizer solution INHALE THE CONTENTS OF 1 VIAL VIA NEBULIZER EVERY 6 HOURS AS NEEDED FOR WHEEZING  OR FOR SHORTNESS OF BREATH 90 mL 1   albuterol (VENTOLIN HFA) 108 (90 Base) MCG/ACT inhaler INHALE 2 PUFFS INTO THE LUNGS EVERY 6 HOURS AS NEEDED FOR WHEEZING OR SHORTNESS OF BREATH (USE WITH SPACER) 1 each 1   aspirin EC 81 MG EC tablet Take 1 tablet (81 mg total) by mouth daily.     Calcium Carb-Cholecalciferol 600-800 MG-UNIT TABS Take 1 tablet by mouth in the morning, at noon, and at  bedtime.      docusate sodium (COLACE) 100 MG capsule Take 100 mg by mouth daily.     famotidine (PEPCID AC) 10 MG chewable tablet Chew 20 mg by mouth daily as needed for heartburn.      ferrous sulfate 325 (65 FE) MG tablet Take 325 mg by mouth daily with breakfast.     FLOVENT HFA 110 MCG/ACT inhaler INHALE 2 PUFFS TWICE DAILY 36 g 1   furosemide (LASIX) 20 MG tablet TAKE 1 TABLET EVERY DAY . MAY TAKE AN ADDITIONAL TABLET AS NEEDED FOR WEIGHT GAIN OF 2 LBS OVERNIGHT OR 5 LBS IN ONE WEEK 90 tablet 0   levothyroxine (SYNTHROID) 150 MCG tablet TAKE 1 TABLET EVERY DAY BEFORE BREAKFAST (INCREASED DOSE) 90 tablet 1   loratadine (CLARITIN) 10 MG tablet Take 10 mg by mouth daily.     losartan (COZAAR) 50 MG tablet TAKE 1 TABLET EVERY DAY 90 tablet 1   OXYGEN Inhale 2 L into the lungs at bedtime as needed (at bedtime and as needed).     oxymetazoline (AFRIN) 0.05 % nasal spray Place 1 spray into both nostrils 2 (two) times daily. As needed for nose bleed     polyethylene glycol powder (GLYCOLAX/MIRALAX) powder Take 17 g by mouth 2 (two) times daily as needed. 3350 g 1   STIOLTO RESPIMAT 2.5-2.5 MCG/ACT AERS INHALE  2  PUFFS  INTO  THE  LUNGS EVERY DAY 12 g  1   traMADol (ULTRAM) 50 MG tablet Take 1-2 tablets (50-100 mg total) by mouth every 4 (four) hours as needed for moderate pain. 30 tablet 0   vitamin B-12 (CYANOCOBALAMIN) 1000 MCG tablet Take 1 tablet (1,000 mcg total) by mouth daily. 90 tablet 3   Vitamin D, Ergocalciferol, (DRISDOL) 1.25 MG (50000 UNIT) CAPS capsule TAKE 1 CAPSULE (50,000 UNITS TOTAL) BY MOUTH ONCE A WEEK. 12 capsule 0   No facility-administered medications prior to visit.    Review of Systems;  Patient denies headache, fevers, malaise, unintentional weight loss, skin rash, eye pain, sinus congestion and sinus pain, sore throat, dysphagia,  hemoptysis , cough, dyspnea, wheezing, chest pain, palpitations, orthopnea, edema, abdominal pain, nausea, melena, diarrhea, constipation,  flank pain, dysuria, hematuria, urinary  Frequency, nocturia, numbness, tingling, seizures,  Focal weakness, Loss of consciousness,  Tremor, insomnia, depression, anxiety, and suicidal ideation.      Objective:  BP 122/80 (BP Location: Left Arm, Patient Position: Sitting, Cuff Size: Large)   Pulse 89   Temp (!) 96.6 F (35.9 C) (Temporal)   Ht 5\' 2"  (1.575 m)   Wt 250 lb 12.8 oz (113.8 kg)   SpO2 99%   BMI 45.87 kg/m   BP Readings from Last 3 Encounters:  12/20/20 122/80  12/11/20 127/79  11/26/20 130/70    Wt Readings from Last 3 Encounters:  12/20/20 250 lb 12.8 oz (113.8 kg)  12/11/20 250 lb (113.4 kg)  11/26/20 249 lb 1.9 oz (113 kg)    General appearance: alert, cooperative and appears stated age Ears: normal TM's and external ear canals both ears Throat: lips, mucosa, and tongue normal; teeth and gums normal Neck: no adenopathy, no carotid bruit, supple, symmetrical, trachea midline and thyroid not enlarged, symmetric, no tenderness/mass/nodules Back: symmetric, no curvature. ROM normal. No CVA tenderness. Lungs: clear to auscultation bilaterally Heart: regular rate and rhythm, S1, S2 normal, no murmur, click, rub or gallop Abdomen: soft, non-tender; bowel sounds normal; no masses,  no organomegaly Pulses: 2+ and symmetric Skin: Skin color, texture, turgor normal. No rashes or lesions Lymph nodes: Cervical, supraclavicular, and axillary nodes normal.  Lab Results  Component Value Date   HGBA1C 5.6 12/20/2020   HGBA1C 5.5 01/26/2019   HGBA1C 5.3 07/30/2017    Lab Results  Component Value Date   CREATININE 0.97 12/20/2020   CREATININE 0.88 11/26/2020   CREATININE 0.84 06/13/2020    Lab Results  Component Value Date   WBC 8.4 11/26/2020   HGB 14.8 11/26/2020   HCT 44.7 11/26/2020   PLT 167 11/26/2020   GLUCOSE 76 12/20/2020   CHOL 179 06/13/2020   TRIG 82.0 06/13/2020   HDL 60.60 06/13/2020   LDLDIRECT 101.0 07/30/2017   LDLCALC 102 (H) 06/13/2020    ALT 30 11/26/2020   AST 27 11/26/2020   NA 141 12/20/2020   K 4.5 12/20/2020   CL 99 12/20/2020   CREATININE 0.97 12/20/2020   BUN 19 12/20/2020   CO2 32 12/20/2020   TSH 0.95 12/20/2020   INR 1.1 (H) 06/13/2020   HGBA1C 5.6 12/20/2020    DG Chest 2 View  Result Date: 11/26/2020 CLINICAL DATA:  Shortness of breath, chronic cough EXAM: CHEST - 2 VIEW COMPARISON:  None. FINDINGS: Mild cardiomegaly. Both lungs are clear. Disc degenerative disease the thoracic spine. IMPRESSION: Mild cardiomegaly without acute abnormality of the lungs. Electronically Signed   By: Eddie Candle M.D.   On: 11/26/2020 17:41    Assessment & Plan:   Problem  List Items Addressed This Visit       Unprioritized   COPD (chronic obstructive pulmonary disease) ([place]) (Chronic)    She denies any recent asthma exacerbations.  She has been advised to continue current regimen and  follow up with pulmonary as advised.       Hypertension (Chronic)    Well controlled on current regimen of losartan 50 mg daily. Renal function stable, no changes today.      Hypothyroidism - Primary (Chronic)   Relevant Orders   TSH (Completed)   Hypocalcemia    Secondary to primary hypoparathyroidism. Managed without calcitriol as long as her level stays 8.0 or above.  Continue 50,000 Is of  vitamin D along with calcium supplements bid;  scheduled to see Endocrinology in January . Rechecking ionized level today      Relevant Orders   Calcium, ionized (Completed)   Basic metabolic panel (Completed)   Morbid obesity ([place])    With concurrent hypotension and chronic nocturnal hypoxia.  Encouraged to make better choices for meals,  Resume walking daily for 30 minutes.  Was able to lose3 35 lbs in 2018 with regular exercise.  Screening for DM today.  Lab Results  Component Value Date   HGBA1C 5.6 12/20/2020   Lab Results  Component Value Date   TSH 0.95 12/20/2020   Lab Results  Component Value Date   CHOL 179 06/13/2020    HDL 60.60 06/13/2020   LDLCALC 102 (H) 06/13/2020   LDLDIRECT 101.0 07/30/2017   TRIG 82.0 06/13/2020   CHOLHDL 3 06/13/2020          Relevant Orders   Hemoglobin A1c (Completed)   PAF (paroxysmal atrial fibrillation) ([facility])    She remains in sinus rhythm currently.        Recurrent epistaxis    Secondary to use of supplemental oxygen drying her nasal passages . Advised to use ayr gel and saline several times daily        Other Visit Diagnoses     Need for immunization against influenza       Relevant Orders   Flu Vaccine QUAD High Dose(Fluad) (Completed)     No orders of the defined types were placed in this encounter.   There are no discontinued medications.  Follow-up: No follow-ups on file.   [name], MD

## 2020-12-20 NOTE — Assessment & Plan Note (Addendum)
With concurrent hypotension and chronic nocturnal hypoxia.  Encouraged to make better choices for meals,  Resume walking daily for 30 minutes.  Was able to lose3 35 lbs in 2018 with regular exercise.  Screening for DM today.  Lab Results  Component Value Date   HGBA1C 5.6 12/20/2020   Lab Results  Component Value Date   TSH 0.95 12/20/2020   Lab Results  Component Value Date   CHOL 179 06/13/2020   HDL 60.60 06/13/2020   LDLCALC 102 (H) 06/13/2020   LDLDIRECT 101.0 07/30/2017   TRIG 82.0 06/13/2020   CHOLHDL 3 06/13/2020

## 2020-12-20 NOTE — Patient Instructions (Addendum)
  I recommend getting into a routine  with your walking .  Use the [employer] outlet  as your walking track  Start with your vacation. Use the boardwalk [place] !!  Take the furosemide with you in case you eat too much salt  I want you to lose 35 lbs over the next year.   Trade the banana for blueberries,strawberries  See if one of these foods is on your foodstamp list:  (Protein shakes) Atkins Premier Protein Muscle Milk Advantage  Microwave entrees: Marine scientist and Healthy Choice "steamers" and low carb Power bowls   Choose low sodium popcorn,  carrots .  Cheese sticks will provide calcium too!

## 2020-12-20 NOTE — Assessment & Plan Note (Signed)
Well controlled on current regimen of losartan 50 mg daily. Renal function stable, no changes today. 

## 2020-12-20 NOTE — Assessment & Plan Note (Signed)
She denies any recent asthma exacerbations.  She has been advised to continue current regimen and  follow up with pulmonary as advised.

## 2020-12-20 NOTE — Assessment & Plan Note (Addendum)
Secondary to primary hypoparathyroidism. Managed without calcitriol as long as her level stays 8.0 or above.  Continue 50,000 Is of  vitamin D along with calcium supplements bid;  scheduled to see Endocrinology in January . Rechecking ionized level today

## 2020-12-20 NOTE — Assessment & Plan Note (Signed)
She remains in sinus rhythm currently.

## 2020-12-21 LAB — CALCIUM, IONIZED: Calcium, Ion: 4.14 mg/dL — ABNORMAL LOW (ref 4.8–5.6)

## 2020-12-22 NOTE — Assessment & Plan Note (Signed)
Secondary to use of supplemental oxygen drying her nasal passages . Advised to use ayr gel and saline several times daily

## 2020-12-27 ENCOUNTER — Ambulatory Visit: Payer: Medicare HMO | Admitting: Pulmonary Disease

## 2021-01-05 DIAGNOSIS — J449 Chronic obstructive pulmonary disease, unspecified: Secondary | ICD-10-CM | POA: Diagnosis not present

## 2021-01-08 ENCOUNTER — Other Ambulatory Visit: Payer: Self-pay

## 2021-01-08 MED ORDER — ALBUTEROL SULFATE (2.5 MG/3ML) 0.083% IN NEBU: INHALATION_SOLUTION | RESPIRATORY_TRACT | 1 refills | Status: DC

## 2021-01-08 MED ORDER — ALBUTEROL SULFATE HFA 108 (90 BASE) MCG/ACT IN AERS: INHALATION_SPRAY | RESPIRATORY_TRACT | 1 refills | Status: DC

## 2021-01-08 MED ORDER — STIOLTO RESPIMAT 2.5-2.5 MCG/ACT IN AERS: INHALATION_SPRAY | RESPIRATORY_TRACT | 1 refills | Status: DC

## 2021-01-15 ENCOUNTER — Telehealth: Payer: Self-pay | Admitting: Internal Medicine

## 2021-01-15 DIAGNOSIS — E034 Atrophy of thyroid (acquired): Secondary | ICD-10-CM

## 2021-01-15 DIAGNOSIS — J449 Chronic obstructive pulmonary disease, unspecified: Secondary | ICD-10-CM

## 2021-01-15 MED ORDER — FLUTICASONE PROPIONATE HFA 110 MCG/ACT IN AERO
2.0000 | INHALATION_SPRAY | Freq: Two times a day (BID) | RESPIRATORY_TRACT | 1 refills | Status: DC
Start: 2021-01-15 — End: 2021-06-04

## 2021-01-15 MED ORDER — LEVOTHYROXINE SODIUM 150 MCG PO TABS
150.0000 ug | ORAL_TABLET | Freq: Every day | ORAL | 1 refills | Status: DC
Start: 2021-01-15 — End: 2021-06-04

## 2021-01-15 MED ORDER — FUROSEMIDE 20 MG PO TABS: ORAL_TABLET | ORAL | 0 refills | Status: DC

## 2021-01-15 MED ORDER — ALBUTEROL SULFATE HFA 108 (90 BASE) MCG/ACT IN AERS: INHALATION_SPRAY | RESPIRATORY_TRACT | 1 refills | Status: DC

## 2021-01-15 MED ORDER — ALBUTEROL SULFATE (2.5 MG/3ML) 0.083% IN NEBU: INHALATION_SOLUTION | RESPIRATORY_TRACT | 1 refills | Status: DC

## 2021-01-15 MED ORDER — LOSARTAN POTASSIUM 50 MG PO TABS: 50.0000 mg | ORAL_TABLET | Freq: Every day | ORAL | 1 refills | Status: DC

## 2021-01-15 NOTE — Telephone Encounter (Signed)
Patient has switched insurance companies and she is needing all her medication called into [facility]. The patient will be her Thursday to bring a copy of her card.

## 2021-01-16 ENCOUNTER — Telehealth: Payer: Self-pay | Admitting: Internal Medicine

## 2021-01-16 MED ORDER — STIOLTO RESPIMAT 2.5-2.5 MCG/ACT IN AERS: INHALATION_SPRAY | RESPIRATORY_TRACT | 1 refills | Status: DC

## 2021-01-16 NOTE — Telephone Encounter (Signed)
LMTCB. Need to find out what pharmacy this medication needs to be sent to.

## 2021-01-16 NOTE — Telephone Encounter (Signed)
Medication has been refilled and pt is aware.  

## 2021-01-16 NOTE — Telephone Encounter (Signed)
  Encourage patient to contact the pharmacy for refills or they can request refills through [place]:  Please schedule appointment if longer than 1 year  NEXT APPOINTMENT DATE:03/27/2021  MEDICATION:stiolto Respimat  Is the patient out of medication? yes  PHARMACY:   Let patient know to contact pharmacy at the end of the day to make sure medication is ready.  Please notify patient to allow 48-72 hours to process  CLINICAL FILLS OUT ALL BELOW:   LAST REFILL:  QTY:  REFILL DATE:    OTHER COMMENTS:    Okay for refill?  Please advise

## 2021-01-16 NOTE — Telephone Encounter (Signed)
Patient is returning your call from earlier.She stated the pharmacy she is using is [facility].

## 2021-02-05 DIAGNOSIS — J449 Chronic obstructive pulmonary disease, unspecified: Secondary | ICD-10-CM | POA: Diagnosis not present

## 2021-02-14 ENCOUNTER — Ambulatory Visit: Payer: Medicare HMO | Admitting: Pulmonary Disease

## 2021-02-18 ENCOUNTER — Ambulatory Visit (INDEPENDENT_AMBULATORY_CARE_PROVIDER_SITE_OTHER): Payer: Medicare Other

## 2021-02-18 VITALS — Ht 62.0 in | Wt 250.0 lb

## 2021-02-18 DIAGNOSIS — Z0000 Encounter for general adult medical examination without abnormal findings: Secondary | ICD-10-CM

## 2021-02-18 NOTE — Progress Notes (Addendum)
Subjective:   Lauren Bartlett is a 65 y.o. female who presents for Medicare Annual (Subsequent) preventive examination.  Review of Systems    No ROS.  Medicare Wellness Virtual Visit.  Visual/audio telehealth visit, UTA vital signs.   See social history for additional risk factors.   Cardiac Risk Factors include: advanced age (>74men, >73 women);hypertension     Objective:    Today's Vitals   02/18/21 1234  Weight: 250 lb (113.4 kg)  Height: 5\' 2"  (1.575 m)   Body mass index is 45.73 kg/m.  Advanced Directives 02/18/2021 11/26/2020 03/02/2020 02/16/2020 11/26/2019 06/09/2019 06/09/2019  Does Patient Have a Medical Advance Directive? No No No No No No No  Would patient like information on creating a medical advance directive? No - Patient declined - No - Patient declined No - Patient declined - - -    Current Medications (verified) Outpatient Encounter Medications as of 02/18/2021  Medication Sig   acetaminophen (TYLENOL) 500 MG tablet Take 500 mg by mouth 2 (two) times daily as needed for moderate pain or headache.   albuterol (PROVENTIL) (2.5 MG/3ML) 0.083% nebulizer solution INHALE THE CONTENTS OF 1 VIAL VIA NEBULIZER EVERY 6 HOURS AS NEEDED FOR WHEEZING  OR FOR SHORTNESS OF BREATH   albuterol (VENTOLIN HFA) 108 (90 Base) MCG/ACT inhaler INHALE 2 PUFFS INTO THE LUNGS EVERY 6 HOURS AS NEEDED FOR WHEEZING OR SHORTNESS OF BREATH (USE WITH SPACER)   aspirin EC 81 MG EC tablet Take 1 tablet (81 mg total) by mouth daily.   Calcium Carb-Cholecalciferol 600-800 MG-UNIT TABS Take 1 tablet by mouth in the morning, at noon, and at bedtime.    docusate sodium (COLACE) 100 MG capsule Take 100 mg by mouth daily.   famotidine (PEPCID AC) 10 MG chewable tablet Chew 20 mg by mouth daily as needed for heartburn.    ferrous sulfate 325 (65 FE) MG tablet Take 325 mg by mouth daily with breakfast.   fluticasone (FLOVENT HFA) 110 MCG/ACT inhaler Inhale 2 puffs into the lungs 2 (two) times daily.    furosemide (LASIX) 20 MG tablet TAKE 1 TABLET EVERY DAY . MAY TAKE AN ADDITIONAL TABLET AS NEEDED FOR WEIGHT GAIN OF 2 LBS OVERNIGHT OR 5 LBS IN ONE WEEK   levothyroxine (SYNTHROID) 150 MCG tablet Take 1 tablet (150 mcg total) by mouth daily before breakfast.   loratadine (CLARITIN) 10 MG tablet Take 10 mg by mouth daily.   losartan (COZAAR) 50 MG tablet Take 1 tablet (50 mg total) by mouth daily.   OXYGEN Inhale 2 L into the lungs at bedtime as needed (at bedtime and as needed).   oxymetazoline (AFRIN) 0.05 % nasal spray Place 1 spray into both nostrils 2 (two) times daily. As needed for nose bleed   polyethylene glycol powder (GLYCOLAX/MIRALAX) powder Take 17 g by mouth 2 (two) times daily as needed.   Tiotropium Bromide-Olodaterol (STIOLTO RESPIMAT) 2.5-2.5 MCG/ACT AERS INHALE  2  PUFFS  INTO  THE  LUNGS EVERY DAY   traMADol (ULTRAM) 50 MG tablet Take 1-2 tablets (50-100 mg total) by mouth every 4 (four) hours as needed for moderate pain.   vitamin B-12 (CYANOCOBALAMIN) 1000 MCG tablet Take 1 tablet (1,000 mcg total) by mouth daily.   Vitamin D, Ergocalciferol, (DRISDOL) 1.25 MG (50000 UNIT) CAPS capsule TAKE 1 CAPSULE (50,000 UNITS TOTAL) BY MOUTH ONCE A WEEK.   No facility-administered encounter medications on file as of 02/18/2021.    Allergies (verified) Codeine, Levaquin [levofloxacin], Penicillins, Zithromax [azithromycin],  and Prednisone   History: Past Medical History:  Diagnosis Date   Anxiety    Bleeding from the nose    Chronic diastolic congestive heart failure ([place])    a. 03/2016 Echo: EF 55-60%, Gr2 DD; b. 08/2016 Echo: EF 60-65%, nl diast fxn; c. 01/2019 Echo: EF 55-60%. Nl RV fxn.   Congenital absence of one kidney    COPD (chronic obstructive pulmonary disease) ([place])    a. 02/2017 PFT: mod/sev obstructive airway dzs w/ significant bronchodilator response.   Dep[facility]    [facility]   Dyspnea    Emphysema/COPD [facility])    GERD (gastroesophageal reflux  disease)    History of cardiac cath    a. 05/2016 Cath: nl cors.   History of sciatica    Hypertension    Hypothyroidism    Mitral Valve Prolapse & Severe mitral regurgitation s/p MVR    a. 03/2016 Echo: severe MVP involving the posterior leaflet, sev MR;  b. 06/2016 min invasive MV repair w/ triangular resection of flail segment (P2) of posterior leaflet, gore-tex neochord placement x 4, Sorin Memo 3D rin Annulosplasty (43mm, catalog # [id number], ser # [id number]); c. 08/2016 Echo: MV area 2.06 cm^2 (pressure 1/2 time); d. 01/2019 Echo: Mild MS w/ mean grad 44mmHg.   Motion sickness    cars   Pneumonia    PONV (postoperative nausea and vomiting)    Post-op Afib    a. 06/2016 following MV repair-->short-course amio. Coumadin d/c'd 2/2 epistaxis.   Pulmonary hypertension ([place])    a. 08/2016 Echo: PASP 23mmHg; b. 01/2019 Echo: RVSP 45.97mmHg.   S/P patent foramen ovale closure    a. 06/2016 @ time of MV Repair.   Sleep apnea    O2 at night and PRN   Past Surgical History:  Procedure Laterality Date   ABDOMINAL HYSTERECTOMY     BREAST BIOPSY Right 2011   [facility]< benign   CATARACT EXTRACTION W/PHACO Left 11/14/2015   Procedure: CATARACT EXTRACTION PHACO AND INTRAOCULAR LENS PLACEMENT (IOC);  Surgeon: Leandrew Koyanagi, MD;  Location: [place];  Service: Ophthalmology;  Laterality: Left;  sleep apnea [place]   CATARACT EXTRACTION W/PHACO Right 12/19/2015   Procedure: CATARACT EXTRACTION PHACO AND INTRAOCULAR LENS PLACEMENT (IOC);  Surgeon: Leandrew Koyanagi, MD;  Location: [place];  Service: Ophthalmology;  Laterality: Right;  ANXIETY GENEROUS IV SEDATION TORIC LEN   COMBINED HYSTERECTOMY ABDOMINAL W/ A&P REPAIR / OOPHORECTOMY  1996   benign tumor   INNER EAR SURGERY     bilateral   MITRAL VALVE REPAIR Right 07/17/2016   Procedure: MINIMALLY INVASIVE MITRAL VALVE REPAIR (MVR) USING 26 SORIN MEMO 3D ANNULOPLASTY RING;  Surgeon: Rexene Alberts, MD;  Location: [place];  Service: Open  Heart Surgery;  Laterality: Right;   PATENT FORAMEN OVALE(PFO) CLOSURE N/A 07/17/2016   Procedure: PATENT FORAMEN OVALE (PFO) CLOSURE;  Surgeon: Rexene Alberts, MD;  Location: [place];  Service: Open Heart Surgery;  Laterality: N/A;   RIGHT/LEFT HEART CATH AND CORONARY ANGIOGRAPHY Bilateral 06/16/2016   Procedure: Right/Left Heart Cath and Coronary Angiography;  Surgeon: Wellington Hampshire, MD;  Location: [place] [facility];  Service: Cardiovascular;  Laterality: Bilateral;   TEE WITHOUT CARDIOVERSION N/A 05/19/2016   Procedure: TRANSESOPHAGEAL ECHOCARDIOGRAM (TEE);  Surgeon: Wellington Hampshire, MD;  Location: [facility];  Service: Cardiovascular;  Laterality: N/A;   TEE WITHOUT CARDIOVERSION N/A 06/09/2016   Procedure: TRANSESOPHAGEAL ECHOCARDIOGRAM (TEE);  Surgeon: Wellington Hampshire, MD;  Location: [facility];  Service:  Cardiovascular;  Laterality: N/A;   TEE WITHOUT CARDIOVERSION N/A 06/16/2016   Procedure: TRANSESOPHAGEAL ECHOCARDIOGRAM (TEE);  Surgeon: Wellington Hampshire, MD;  Location: [facility];  Service: Cardiovascular;  Laterality: N/A;   TEE WITHOUT CARDIOVERSION N/A 07/17/2016   Procedure: TRANSESOPHAGEAL ECHOCARDIOGRAM (TEE);  Surgeon: Rexene Alberts, MD;  Location: [place];  Service: Open Heart Surgery;  Laterality: N/A;   TONSILLECTOMY     Family History  Problem Relation Age of Onset   Multiple sclerosis Mother    Hypertension Mother    Coronary artery disease Father    Heart disease Father    Hypertension Father    Heart disease Brother    Cancer Brother    Breast cancer Paternal Aunt    Social History   Socioeconomic History   Marital status: Single    Spouse name: Not on file   Number of children: Not on file   Years of education: Not on file   Highest education level: Not on file  Occupational History   Occupation: cna    Comment: part time  Tobacco Use   Smoking status: Never   Smokeless tobacco: Never  Vaping Use   Vaping Use: Never used  Substance and Sexual Activity    Alcohol use: No   Drug use: No   Sexual activity: Not Currently  Other Topics Concern   Not on file  Social History Narrative   Lives in a group home for adults         Social Determinants of Health   Financial Resource Strain: Low Risk    Difficulty of Paying Living Expenses: Not hard at all  Food Insecurity: No Food Insecurity   Worried About Charity fundraiser in the Last Year: Never true   [place] in the Last Year: Never true  Transportation Needs: No Transportation Needs   Lack of Transportation (Medical): No   Lack of Transportation (Non-Medical): No  Physical Activity: Insufficiently Active   Days of Exercise per Week: 7 days   Minutes of Exercise per Session: 10 min  Stress: No Stress Concern Present   Feeling of Stress : Not at all  Social Connections: Unknown   Frequency of Communication with Friends and Family: More than three times a week   Frequency of Social Gatherings with Friends and Family: More than three times a week   Attends Religious Services: More than 4 times per year   Active Member of [employer] or Organizations: Not on file   Attends Archivist Meetings: Not on file   Marital Status: Never married    Tobacco Counseling Counseling given: Not Answered   Clinical Intake:  Pre-visit preparation completed: Yes        Diabetes: No  How often do you need to have someone help you when you read instructions, pamphlets, or other written materials from your doctor or pharmacy?: 1 - Never    Interpreter Needed?: No      Activities of Daily Living In your present state of health, do you have any difficulty performing the following activities: 02/18/2021  Hearing? Y  Comment Hearing aids  Vision? N  Difficulty concentrating or making decisions? N  Walking or climbing stairs? Y  Comment Unsteady gait. Cane and/or walker in use.  Dressing or bathing? N  Doing errands, shopping? N  Preparing Food and eating ? Y  Comment Meals  on Wheels assist. Self feeds.  Using the Toilet? N  In the past six months, have  you accidently leaked urine? Y  Comment Managed with daily pad  Do you have problems with loss of bowel control? N  Managing your Medications? N  Managing your Finances? N  Comment Brother assist as needed  Housekeeping or managing your Housekeeping? Y  Comment Maid assist  Some recent data might be hidden    Patient Care Team: [name], MD as PCP - General (Internal Medicine) Wellington Hampshire, MD as PCP - Cardiology (Cardiology)  Indicate any recent Medical Services you may have received from other than [facility] providers in the past year (date may be approximate).     Assessment:   This is a routine wellness examination for Rodnisha.  Virtual Visit via Telephone Note  I connected with  Lauren Bartlett on 02/18/21 at 12:30 PM EST by telephone and verified that I am speaking with the correct person using two identifiers.  Location: Patient: home Provider: office Persons participating in the virtual visit: patient/Nurse Health Advisor   I discussed the limitations, risks, security and privacy concerns of performing an evaluation and management service by telephone and the availability of in person appointments. The patient expressed understanding and agreed to proceed.  Interactive audio and video telecommunications were attempted between this nurse and patient, however failed, due to patient having technical difficulties OR patient did not have access to video capability.  We continued and completed visit with audio only.  Some vital signs may be absent or patient reported.   Hearing/Vision screen Hearing Screening - Comments:: Patient is able to hear conversational tones without difficulty.  No issues reported. Vision Screening - Comments:: Followed by [facility]  Wears corrective lenses when reading  Cataract extraction, bilateral  They have regular follow up with the  ophthalmologist  Dietary issues and exercise activities discussed: Current Exercise Habits: Home exercise routine, Type of exercise: walking, Time (Minutes): 10, Frequency (Times/Week): 7, Weekly Exercise (Minutes/Week): 70, Intensity: Mild Regular diet. Monitors sodium.  Good fluid intake   Goals Addressed               This Visit's Progress     Patient Stated     Weight goal 200lb (pt-stated)        Walk more for exercise. Lose weight. Monitor sodium intake.        Depression Screen PHQ 2/9 Scores 02/18/2021 12/20/2020 09/14/2020 02/16/2020 02/15/2019 02/11/2018 02/10/2017  PHQ - 2 Score 0 0 0 0 0 0 1  PHQ- 9 Score - - 1 - - - 3    Fall Risk Fall Risk  02/18/2021 12/20/2020 09/14/2020 06/13/2020 02/16/2020  Falls in the past year? 0 0 0 0 0  Comment - - - - -  Number falls in past yr: 0 - - - 0  Injury with Fall? - - - - 0  Risk for fall due to : - - - - -  Follow up Falls evaluation completed Falls evaluation completed Falls evaluation completed Falls evaluation completed Falls evaluation completed    [place]: Home free of loose throw rugs in walkways, pet beds, electrical cords, etc? Yes  Adequate lighting in your home to reduce risk of falls? Yes   ASSISTIVE DEVICES UTILIZED TO PREVENT FALLS: Life alert? Yes  Use of a cane, walker or w/c? Yes  Grab bars in the bathroom? Yes  Shower chair or bench in shower? Yes  Elevated toilet seat or a handicapped toilet? No   TIMED UP AND GO:  Was the test performed? No .   Cognitive Function: MMSE - Mini Mental State Exam 02/10/2017 02/11/2016  Orientation to time 5 5  Orientation to Place 5 5  Registration 3 3  Attention/ Calculation 5 5  Recall 3 3  Language- name 2 objects 2 2  Language- repeat 1 1  Language- follow 3 step command 3 3  Language- read & follow direction 1 1  Write a sentence 1 1  Copy design 1 1  Total score 30 30     6CIT Screen 02/18/2021 02/16/2020  02/15/2019 02/11/2018 02/11/2016  What Year? 0 points 0 points 0 points 0 points 0 points  What month? 0 points 0 points 0 points 0 points 0 points  What time? 0 points 0 points 0 points 0 points 0 points  Count back from 20 2 points 0 points 0 points 0 points 0 points  Months in reverse 0 points 0 points 0 points 0 points 0 points  Repeat phrase 0 points 0 points 0 points - -  Total Score 2 0 0 - -    Immunizations Immunization History  Administered Date(s) Administered   Fluad Quad(high Dose 65+) 12/20/2020   Influenza Split 01/02/2012   Influenza,inj,Quad PF,6+ Mos 03/13/2014, 01/08/2015, 02/03/2018, 01/26/2019, 02/02/2020   Influenza-Unspecified 01/11/2013, 12/30/2014   PFIZER(Purple Top)SARS-COV-2 Vaccination 08/12/2019, 09/07/2019   Pneumococcal Conjugate-13 02/27/2015   Pneumococcal Polysaccharide-23 03/07/2013, 04/13/2019   Tdap 11/12/2009   TDAP status: Due, Education has been provided regarding the importance of this vaccine. Advised may receive this vaccine at local pharmacy or Health Dept. Aware to provide a copy of the vaccination record if obtained from local pharmacy or Health Dept. Verbalized acceptance and understanding. Deferred.  Covid vaccines- 2 completed.   Shingrix Completed?: No.    Education has been provided regarding the importance of this vaccine. Patient has been advised to call insurance company to determine out of pocket expense if they have not yet received this vaccine. Advised may also receive vaccine at local pharmacy or Health Dept. Verbalized acceptance and understanding.  Screening Tests Health Maintenance  Topic Date Due   COVID-19 Vaccine (3 - Pfizer risk series) 03/06/2021 (Originally 10/05/2019)   DEXA SCAN  03/27/2021 (Originally 12/04/2020)   TETANUS/TDAP  03/27/2021 (Originally 11/13/2019)   Zoster Vaccines- Shingrix (1 of 2) 05/21/2021 (Originally 12/05/1974)   MAMMOGRAM  12/05/2021   Fecal DNA (Cologuard)  01/26/2023   Pneumonia Vaccine 35+  Years old (47 - PPSV23 if available, else PCV20) 04/12/2024   INFLUENZA VACCINE  Completed   Hepatitis C Screening  Completed   HIV Screening  Completed   HPV VACCINES  Aged Out   PAP SMEAR-Modifier  Discontinued    Health Maintenance There are no preventive care reminders to display for this patient.  Mammogram- deferred per patient   Bone density- deferred per patient  Lung Cancer Screening: (Low Dose CT Chest recommended if Age 78-80 years, 30 pack-year currently smoking OR have quit w/in 15years.) does not qualify.   Vision Screening: Recommended annual ophthalmology exams for early detection of glaucoma and other disorders of the eye.  Dental Screening: Recommended annual dental exams for proper oral hygiene  Community Resource Referral / Chronic Care Management: CRR required this visit?  No   CCM required this visit?  No      Plan:   Keep all routine maintenance appointments.   I have personally reviewed and noted the following in the patient's chart:   Medical and social history Use of  alcohol, tobacco or illicit drugs  Current medications and supplements including opioid prescriptions. Not taking opioid.  Functional ability and status Nutritional status Physical activity Advanced directives List of other physicians Hospitalizations, surgeries, and ER visits in previous 12 months Vitals Screenings to include cognitive, depression, and falls Referrals and appointments  In addition, I have reviewed and discussed with patient certain preventive protocols, quality metrics, and best practice recommendations. A written personalized care plan for preventive services as well as general preventive health recommendations were provided to patient.     OBrien-Blaney, Finneas Mathe L, LPN   [id number]      I have reviewed the above information and agree with above.   Deborra Medina, MD

## 2021-02-18 NOTE — Patient Instructions (Addendum)
[name] , Thank you for taking time to come for your Medicare Wellness Visit. I appreciate your ongoing commitment to your health goals. Please review the following plan we discussed and let me know if I can assist you in the future.   These are the goals we discussed:  Goals       Patient Stated     Weight goal 200lb (pt-stated)      Walk more for exercise. Lose weight. Monitor sodium intake.         This is a list of the screening recommended for you and due dates:  Health Maintenance  Topic Date Due   COVID-19 Vaccine (3 - Pfizer risk series) 03/06/2021*   DEXA scan (bone density measurement)  03/27/2021*   Tetanus Vaccine  03/27/2021*   Zoster (Shingles) Vaccine (1 of 2) 05/21/2021*   Mammogram  12/05/2021   Cologuard (Stool DNA test)  01/26/2023   Pneumonia Vaccine (4 - PPSV23 if available, else PCV20) 04/12/2024   Flu Shot  Completed   Hepatitis C Screening: USPSTF Recommendation to screen - Ages 18-79 yo.  Completed   HIV Screening  Completed   HPV Vaccine  Aged Out   Pap Smear  Discontinued  *Topic was postponed. The date shown is not the original due date.    Advanced directives: End of life planning; Advance aging; Advanced directives discussed.  Copy of current HCPOA/Living Will requested.    Conditions/risks identified: not yet completed  Follow up in one year for your annual wellness visit    Preventive Care 65 Years and Older, Female Preventive care refers to lifestyle choices and visits with your health care provider that can promote health and wellness. What does preventive care include? A yearly physical exam. This is also called an annual well check. Dental exams once or twice a year. Routine eye exams. Ask your health care provider how often you should have your eyes checked. Personal lifestyle choices, including: Daily care of your teeth and gums. Regular physical activity. Eating a healthy diet. Avoiding tobacco and drug use. Limiting  alcohol use. Practicing safe sex. Taking low-dose aspirin every day. Taking vitamin and mineral supplements as recommended by your health care provider. What happens during an annual well check? The services and screenings done by your health care provider during your annual well check will depend on your age, overall health, lifestyle risk factors, and family history of disease. Counseling  Your health care provider may ask you questions about your: Alcohol use. Tobacco use. Drug use. Emotional well-being. Home and relationship well-being. Sexual activity. Eating habits. History of falls. Memory and ability to understand (cognition). Work and work Statistician. Reproductive health. Screening  You may have the following tests or measurements: Height, weight, and BMI. Blood pressure. Lipid and cholesterol levels. These may be checked every 5 years, or more frequently if you are over 33 years old. Skin check. Lung cancer screening. You may have this screening every year starting at age 67 if you have a 30-pack-year history of smoking and currently smoke or have quit within the past 15 years. Fecal occult blood test (FOBT) of the stool. You may have this test every year starting at age 51. Flexible sigmoidoscopy or colonoscopy. You may have a sigmoidoscopy every 5 years or a colonoscopy every 10 years starting at age 41. Hepatitis C blood test. Hepatitis B blood test. Sexually transmitted disease (STD) testing. Diabetes screening. This is done by checking your blood sugar (glucose) after you have not  eaten for a while (fasting). You may have this done every 1-3 years. Bone density scan. This is done to screen for osteoporosis. You may have this done starting at age 55. Mammogram. This may be done every 1-2 years. Talk to your health care provider about how often you should have regular mammograms. Talk with your health care provider about your test results, treatment options, and if  necessary, the need for more tests. Vaccines  Your health care provider may recommend certain vaccines, such as: Influenza vaccine. This is recommended every year. Tetanus, diphtheria, and acellular pertussis (Tdap, Td) vaccine. You may need a Td booster every 10 years. Zoster vaccine. You may need this after age 43. Pneumococcal 13-valent conjugate (PCV13) vaccine. One dose is recommended after age 74. Pneumococcal polysaccharide (PPSV23) vaccine. One dose is recommended after age 25. Talk to your health care provider about which screenings and vaccines you need and how often you need them. This information is not intended to replace advice given to you by your health care provider. Make sure you discuss any questions you have with your health care provider. Document Released: 04/13/2015 Document Revised: 12/05/2015 Document Reviewed: 01/16/2015 Elsevier Interactive Patient Education  2017 [place] Prevention in the Home Falls can cause injuries. They can happen to people of all ages. There are many things you can do to make your home safe and to help prevent falls. What can I do on the outside of my home? Regularly fix the edges of walkways and driveways and fix any cracks. Remove anything that might make you trip as you walk through a door, such as a raised step or threshold. Trim any bushes or trees on the path to your home. Use bright outdoor lighting. Clear any walking paths of anything that might make someone trip, such as rocks or tools. Regularly check to see if handrails are loose or broken. Make sure that both sides of any steps have handrails. Any raised decks and porches should have guardrails on the edges. Have any leaves, snow, or ice cleared regularly. Use sand or salt on walking paths during winter. Clean up any spills in your garage right away. This includes oil or grease spills. What can I do in the bathroom? Use night lights. Install grab bars by the toilet  and in the tub and shower. Do not use towel bars as grab bars. Use non-skid mats or decals in the tub or shower. If you need to sit down in the shower, use a plastic, non-slip stool. Keep the floor dry. Clean up any water that spills on the floor as soon as it happens. Remove soap buildup in the tub or shower regularly. Attach bath mats securely with double-sided non-slip rug tape. Do not have throw rugs and other things on the floor that can make you trip. What can I do in the bedroom? Use night lights. Make sure that you have a light by your bed that is easy to reach. Do not use any sheets or blankets that are too big for your bed. They should not hang down onto the floor. Have a firm chair that has side arms. You can use this for support while you get dressed. Do not have throw rugs and other things on the floor that can make you trip. What can I do in the kitchen? Clean up any spills right away. Avoid walking on wet floors. Keep items that you use a lot in easy-to-reach places. If you need to reach  something above you, use a strong step stool that has a grab bar. Keep electrical cords out of the way. Do not use floor polish or wax that makes floors slippery. If you must use wax, use non-skid floor wax. Do not have throw rugs and other things on the floor that can make you trip. What can I do with my stairs? Do not leave any items on the stairs. Make sure that there are handrails on both sides of the stairs and use them. Fix handrails that are broken or loose. Make sure that handrails are as long as the stairways. Check any carpeting to make sure that it is firmly attached to the stairs. Fix any carpet that is loose or worn. Avoid having throw rugs at the top or bottom of the stairs. If you do have throw rugs, attach them to the floor with carpet tape. Make sure that you have a light switch at the top of the stairs and the bottom of the stairs. If you do not have them, ask someone to add  them for you. What else can I do to help prevent falls? Wear shoes that: Do not have high heels. Have rubber bottoms. Are comfortable and fit you well. Are closed at the toe. Do not wear sandals. If you use a stepladder: Make sure that it is fully opened. Do not climb a closed stepladder. Make sure that both sides of the stepladder are locked into place. Ask someone to hold it for you, if possible. Clearly mark and make sure that you can see: Any grab bars or handrails. First and last steps. Where the edge of each step is. Use tools that help you move around (mobility aids) if they are needed. These include: Canes. Walkers. Scooters. Crutches. Turn on the lights when you go into a dark area. Replace any light bulbs as soon as they burn out. Set up your furniture so you have a clear path. Avoid moving your furniture around. If any of your floors are uneven, fix them. If there are any pets around you, be aware of where they are. Review your medicines with your doctor. Some medicines can make you feel dizzy. This can increase your chance of falling. Ask your doctor what other things that you can do to help prevent falls. This information is not intended to replace advice given to you by your health care provider. Make sure you discuss any questions you have with your health care provider. Document Released: 01/11/2009 Document Revised: 08/23/2015 Document Reviewed: 04/21/2014 Elsevier Interactive Patient Education  2017 [employer].

## 2021-02-27 ENCOUNTER — Telehealth: Payer: Self-pay | Admitting: Internal Medicine

## 2021-02-27 MED ORDER — VITAMIN D (ERGOCALCIFEROL) 1.25 MG (50000 UNIT) PO CAPS: 50000.0000 [IU] | ORAL_CAPSULE | ORAL | 0 refills | Status: DC

## 2021-02-27 NOTE — Telephone Encounter (Signed)
Medication has been sent to [facility] in [place] and pt is aware.

## 2021-02-27 NOTE — Telephone Encounter (Signed)
Patient changed insurances . She is needing her Vitamin D, Ergocalciferol, (DRISDOL) 1.25 MG (50000 UNIT) CAPS capsule Called into [facility] [place].

## 2021-03-06 ENCOUNTER — Ambulatory Visit: Payer: Medicare HMO | Admitting: Pulmonary Disease

## 2021-03-07 DIAGNOSIS — J449 Chronic obstructive pulmonary disease, unspecified: Secondary | ICD-10-CM | POA: Diagnosis not present

## 2021-03-18 ENCOUNTER — Other Ambulatory Visit: Payer: Self-pay | Admitting: Internal Medicine

## 2021-03-18 DIAGNOSIS — J449 Chronic obstructive pulmonary disease, unspecified: Secondary | ICD-10-CM

## 2021-03-27 ENCOUNTER — Ambulatory Visit: Payer: Medicare HMO | Admitting: Internal Medicine

## 2021-04-04 ENCOUNTER — Encounter: Payer: Self-pay | Admitting: Pulmonary Disease

## 2021-04-04 ENCOUNTER — Other Ambulatory Visit: Payer: Self-pay

## 2021-04-04 ENCOUNTER — Ambulatory Visit (INDEPENDENT_AMBULATORY_CARE_PROVIDER_SITE_OTHER): Payer: Medicare Other | Admitting: Pulmonary Disease

## 2021-04-04 VITALS — BP 120/82 | HR 87 | Temp 97.5°F | Ht 62.0 in | Wt 254.8 lb

## 2021-04-04 DIAGNOSIS — I2721 Secondary pulmonary arterial hypertension: Secondary | ICD-10-CM

## 2021-04-04 DIAGNOSIS — G4736 Sleep related hypoventilation in conditions classified elsewhere: Secondary | ICD-10-CM

## 2021-04-04 DIAGNOSIS — J449 Chronic obstructive pulmonary disease, unspecified: Secondary | ICD-10-CM | POA: Diagnosis not present

## 2021-04-04 DIAGNOSIS — K766 Portal hypertension: Secondary | ICD-10-CM | POA: Diagnosis not present

## 2021-04-04 NOTE — Progress Notes (Signed)
Subjective:    Patient ID: Lauren Bartlett, female    DOB: [birth year], 66 y.o.   MRN: [id number] Patient Care Team: [name], MD as PCP - General (Internal Medicine) Wellington Hampshire, MD as PCP - Cardiology (Cardiology)  Chief Complaint  Patient presents with  . Follow-up   Requesting MD/Service: Derrel Nip          Date of initial consultation: 03/07/16 by Dr. Merton Border         Reason for consultation: tracheobronchomalacia, DOE   PT PROFILE: 66 y.o. F never smoker with significant lifetime exposure to second hand smoke with multiple medical problems including chronic hypoxic respiratory failure, moderate COPD, mitral valve prolapse and regurgitation, pulm htn by echocardiogram, appearance of tracheomalacia on CT chest previously followed by Dr Raul Del and referred for further evaluation of severe exertional limitation. Initial eval suggested severe mitral regurgitation and impression was that this was a major contributor to her severe DOE   DATA: Echocardiogram 02/01/13: Normal LVEF. MVP with moderate MR, Moderate AI, severe TI. RVSP est 91 mmHg CT chest 07/29/15: Cardiomegaly with left atrial dilatation. Tracheobronchomalacia. Air trapping in the lungs bilaterally, indicative of small airways disease Echocardiogram 06/13/14: Normal LVEF, Severe MR, RVSP est 62 mmHg Spirometry 03/12/15: FVC 1.94 L (73% pred). FEV1 1.19 L (55% pred), FEV1/FVC 61% 6MWT 03/12/15: 225 meters, no desaturation LE venous US 07/29/15: No DVT Echocardiogram 07/31/15: LVEF 60-65%. MV poorly visualized, trivial MR, LA size normal, TV poorly visualized, RVSP not estimated CT chest 03/11/16: Stable changes of tracheobronchomalacia Echocardiogram 04/15/16: Normal LVEF, Severe MR, moderate PAH Mitral valvuloplasty 07/17/16: @ [facility] by Dr Roxy Manns Echocardiogram 09/15/16: LVEF 60-65%. RVSP estimate 54 mmHg ONO 10/21/16: on RA. 45 mins < 89%, lowest SpO2 84% PFTs 03/26/17: FVC: 1.73 > 1.74 L (63 %pred), FEV1: 1.01 >  1.16 L (45> 52%pred, 15% improvement after bronchodilator), FEV1/FVC: 59 >67%, TLC: 3.27 L (73 %pred), DLCO 46 %pred, DLCO/VA 103%     INTERVAL:  Last evaluated by me on 23 May 2020 this was a virtual visit.  At that time she did not have very many concerns.  She was instructed to continue her Stiolto and Flovent.  She does have nocturnal hypoxemia and is on oxygen.  HPI Patient presents today for follow-up.  This is a scheduled visit after her virtual visit in February.  She has been "getting along well".  She is compliant with her nocturnal oxygen and notes that this therapy helps her.   Review of Systems A 10 point review of systems was performed and it is as noted above otherwise negative.  Patient Active Problem List   Diagnosis Date Noted  . Osteopenia determined by x-ray 08/28/2021  . Acute bronchitis with COPD ([place]) 07/11/2021  . Unsteady gait when walking 04/29/2021  . Aortic atherosclerosis ([place]) 09/16/2020  . Recurrent epistaxis 06/16/2020  . PAF (paroxysmal atrial fibrillation) ([place]) 12/18/2018  . Nocturnal hypoxemia due to obstructive chronic bronchitis ([place]) 02/06/2018  . Encounter for therapeutic drug monitoring 08/13/2016  . S/P minimally invasive mitral valve repair 07/17/2016  . S/P patent foramen ovale closure 07/17/2016  . Chronic diastolic congestive heart failure ([place])   . COPD (chronic obstructive pulmonary disease) ([place]) 05/19/2016  . Vitamin D deficiency 01/11/2015  . Allergic rhinitis 07/09/2014  . S/P hysterectomy with oophorectomy 09/22/2013  . Pulmonary hypertension ([place])   . Hypocalcemia 09/10/2012  . Hypothyroidism 05/04/2012  . Morbid obesity ([place]) 08/04/2011  . Hypertension   . Congenital absence of one  kidney   . History of sciatica    Social History   Tobacco Use  . Smoking status: Never  . Smokeless tobacco: Never  Substance Use Topics  . Alcohol use: No   Allergies  Allergen Reactions  . Codeine Hives and Nausea And Vomiting   . Levaquin [Levofloxacin] Nausea And Vomiting  . Penicillins Hives, Itching and Other (See Comments)    Has patient had a PCN reaction causing immediate rash, facial/tongue/throat swelling, SOB or lightheadedness with hypotension: No Has patient had a PCN reaction causing severe rash involving mucus membranes or skin necrosis: No Has patient had a PCN reaction that required hospitalization No Has patient had a PCN reaction occurring within the last 10 years: No If all of the above answers are "NO", then may proceed with Cephalosporin use.  [name] Zithromax [Azithromycin] Other (See Comments)    Reaction:  Hallucinations   . Prednisone Palpitations and Other (See Comments)    Reaction:  Hallucinations  Can take injections but not oral meds   .meds    Objective:   Physical Exam BP 120/82 (BP Location: Left Arm, Patient Position: Sitting, Cuff Size: Normal)   Pulse 87   Temp (!) 97.5 F (36.4 C) (Oral)   Ht 5\' 2"  (1.575 m)   Wt 254 lb 12.8 oz (115.6 kg)   SpO2 95%   BMI 46.60 kg/m  GENERAL: Obese woman, no acute distress, presents in transport chair.  No conversational dyspnea. HEAD: Normocephalic, atraumatic.  EYES: Pupils equal, round, reactive to light.  No scleral icterus.  MOUTH: Nose/mouth/throat not examined due to masking requirements for COVID 19. NECK: Supple. No thyromegaly. Trachea midline. No JVD.  No adenopathy. PULMONARY: Good air entry bilaterally.  No adventitious sounds. CARDIOVASCULAR: S1 and S2. Regular rate and rhythm.  Accentuated P2. ABDOMEN: Obese otherwise benign. MUSCULOSKELETAL: No joint deformity, no clubbing, no edema.  NEUROLOGIC: No overt focal deficit, gait not tested patient presents in transport chair. SKIN: Intact,warm,dry. PSYCH: Flat affect.     Assessment & Plan:

## 2021-04-04 NOTE — Patient Instructions (Signed)
Continue your oxygen at nighttime  Continue Stiolto and Flovent as you are doing  Your lungs sounded really good today  We will see you in follow-up in 4 months time call sooner should any new problems arise

## 2021-04-07 DIAGNOSIS — J449 Chronic obstructive pulmonary disease, unspecified: Secondary | ICD-10-CM | POA: Diagnosis not present

## 2021-04-22 DIAGNOSIS — E559 Vitamin D deficiency, unspecified: Secondary | ICD-10-CM | POA: Diagnosis not present

## 2021-04-25 LAB — HM HEPATITIS C SCREENING LAB: HM Hepatitis Screen: NEGATIVE

## 2021-04-29 ENCOUNTER — Ambulatory Visit (INDEPENDENT_AMBULATORY_CARE_PROVIDER_SITE_OTHER): Payer: Medicare Other | Admitting: Internal Medicine

## 2021-04-29 ENCOUNTER — Encounter: Payer: Self-pay | Admitting: Internal Medicine

## 2021-04-29 ENCOUNTER — Other Ambulatory Visit: Payer: Self-pay

## 2021-04-29 VITALS — BP 112/80 | HR 89 | Temp 97.9°F | Ht 62.0 in | Wt 256.4 lb

## 2021-04-29 DIAGNOSIS — R2689 Other abnormalities of gait and mobility: Secondary | ICD-10-CM

## 2021-04-29 DIAGNOSIS — R29898 Other symptoms and signs involving the musculoskeletal system: Secondary | ICD-10-CM

## 2021-04-29 DIAGNOSIS — E559 Vitamin D deficiency, unspecified: Secondary | ICD-10-CM

## 2021-04-29 DIAGNOSIS — I480 Paroxysmal atrial fibrillation: Secondary | ICD-10-CM | POA: Diagnosis not present

## 2021-04-29 DIAGNOSIS — I700 Atherosclerosis of aorta: Secondary | ICD-10-CM

## 2021-04-29 DIAGNOSIS — G4736 Sleep related hypoventilation in conditions classified elsewhere: Secondary | ICD-10-CM | POA: Diagnosis not present

## 2021-04-29 DIAGNOSIS — J449 Chronic obstructive pulmonary disease, unspecified: Secondary | ICD-10-CM

## 2021-04-29 DIAGNOSIS — E039 Hypothyroidism, unspecified: Secondary | ICD-10-CM

## 2021-04-29 DIAGNOSIS — R2681 Unsteadiness on feet: Secondary | ICD-10-CM | POA: Diagnosis not present

## 2021-04-29 LAB — COMPREHENSIVE METABOLIC PANEL
ALT: 19 U/L (ref 0–35)
AST: 20 U/L (ref 0–37)
Albumin: 4.1 g/dL (ref 3.5–5.2)
Alkaline Phosphatase: 76 U/L (ref 39–117)
BUN: 13 mg/dL (ref 6–23)
CO2: 32 mEq/L (ref 19–32)
Calcium: 7.4 mg/dL — ABNORMAL LOW (ref 8.4–10.5)
Chloride: 102 mEq/L (ref 96–112)
Creatinine, Ser: 0.91 mg/dL (ref 0.40–1.20)
GFR: 66.25 mL/min (ref >60.00)
Glucose, Bld: 82 mg/dL (ref 70–99)
Potassium: 3.8 mEq/L (ref 3.5–5.1)
Sodium: 144 mEq/L (ref 135–145)
Total Bilirubin: 0.6 mg/dL (ref 0.2–1.2)
Total Protein: 6.8 g/dL (ref 6.0–8.3)

## 2021-04-29 LAB — MAGNESIUM: Magnesium: 1.9 mg/dL (ref 1.5–2.5)

## 2021-04-29 LAB — VITAMIN D 25 HYDROXY (VIT D DEFICIENCY, FRACTURES): VITD: 60.85 ng/mL (ref 30.00–100.00)

## 2021-04-29 LAB — LIPID PANEL
Cholesterol: 192 mg/dL (ref 0–200)
HDL: 59.9 mg/dL (ref >39.00)
LDL Cholesterol: 114 mg/dL — ABNORMAL HIGH (ref 0–99)
NonHDL: 132.33
Total CHOL/HDL Ratio: 3
Triglycerides: 92 mg/dL (ref 0.0–149.0)
VLDL: 18.4 mg/dL (ref 0.0–40.0)

## 2021-04-29 LAB — TSH: TSH: 0.65 u[IU]/mL (ref 0.35–5.50)

## 2021-04-29 MED ORDER — TETANUS-DIPHTH-ACELL PERTUSSIS 5-2.5-18.5 LF-MCG/0.5 IM SUSY: 0.5000 mL | PREFILLED_SYRINGE | Freq: Once | INTRAMUSCULAR | 0 refills | Status: AC

## 2021-04-29 MED ORDER — ZOSTER VAC RECOMB ADJUVANTED 50 MCG/0.5ML IM SUSR: 0.5000 mL | Freq: Once | INTRAMUSCULAR | 1 refills | Status: AC

## 2021-04-29 NOTE — Assessment & Plan Note (Addendum)
HER LEVEL IS < 8. Confirmed  WITH IONIZED CALCIUM,  Normal  magnesium.  Seen by Dr Honor Junes last week Advised to continue D/calcium supplements and recheck.   Will start calcitriol   Lab Results  Component Value Date   CALCIUM 7.4 (L) 04/29/2021   PHOS 4.2 06/11/2019

## 2021-04-29 NOTE — Assessment & Plan Note (Signed)
She has deferred statin use repeatedly

## 2021-04-29 NOTE — Assessment & Plan Note (Signed)
With concurrent hypotension and chronic nocturnal hypoxia.  Encouraged to make better choices for meals,  Resume walking daily for 30 minutes.  Was able to lose  35 lbs in 2018 with regular exercise.  Screening for DM has been done and a1c is still 5.6  Lab Results  Component Value Date   HGBA1C 5.6 12/20/2020   Lab Results  Component Value Date   TSH 0.95 12/20/2020   Lab Results  Component Value Date   CHOL 179 06/13/2020   HDL 60.60 06/13/2020   LDLCALC 102 (H) 06/13/2020   LDLDIRECT 101.0 07/30/2017   TRIG 82.0 06/13/2020   CHOLHDL 3 06/13/2020

## 2021-04-29 NOTE — Progress Notes (Addendum)
Subjective:  Patient ID: Lauren Bartlett, female    DOB: 1955-07-12  Age: 66 y.o. MRN: [id number]  CC: The primary encounter diagnosis was Unsteady gait when walking. Diagnoses of Bilateral leg weakness, Balance problem, Hypocalcemia, Vitamin D deficiency, Acquired hypothyroidism, Aortic atherosclerosis ([place]), PAF (paroxysmal atrial fibrillation) ([place]), Morbid obesity ([place]), and Nocturnal hypoxemia due to obstructive chronic bronchitis ([place]) were also pertinent to this visit.   This visit occurred during the SARS-CoV-2 public health emergency.  Safety protocols were in place, including screening questions prior to the visit, additional usage of staff PPE, and extensive cleaning of exam room while observing appropriate contact time as indicated for disinfecting solutions.    HPI Lauren Bartlett presents for follow up on morbid obesity, PAF.  And hypertension  Chief Complaint  Patient presents with   Follow-up    1) Hypertension: patient checks blood pressure twice weekly at home.  Readings have been for the most part < 140/80 at rest . Patient is following a reduced salt diet most days and is taking medications as prescribed   2) Morbid obesity:  has gained 46 lbs since 2018.  Not walking regularly due to loss of balance and feeling unsteady.  Doesn't want to walk alone. . Uses a cane  but wants a walker.  Home PT recommended.  Has a home health aide 2.5 hrs per day from [facility].  Lives in a private APT.  No falls   3) Aortic arch atherosclerosis:  Reviewed findings of prior CT scan today..  Patient  remains  apprehensive about  taking a statin.  Discussed the role of statin therapy in stablizing placque and preventing events.  Lipids to be repeated today  4) Hypocalcemia:  seen last week by Endocrincology.  No changes to regimen  repeat today   5) Had a home health RN eval last week  a1c 5.6     periperal circulation checked R foot ABI 0.75 , left was 0.95 . No history of DM or tobacco  abuse. Denies claudication symptoms.   Outpatient Medications Prior to Visit  Medication Sig Dispense Refill   acetaminophen (TYLENOL) 500 MG tablet Take 500 mg by mouth 2 (two) times daily as needed for moderate pain or headache.     albuterol (PROVENTIL) (2.5 MG/3ML) 0.083% nebulizer solution INHALE THE CONTENTS OF 1 VIAL VIA NEBULIZER EVERY 6 HOURS AS NEEDED FOR WHEEZING  OR FOR SHORTNESS OF BREATH 90 mL 1   albuterol (VENTOLIN HFA) 108 (90 Base) MCG/ACT inhaler INHALE 2 PUFFS INTO THE LUNGS EVERY 6 HOURS AS NEEDED FOR WHEEZING OR SHORTNESS OF BREATH (USE WITH SPACER) 1 each 1   aspirin EC 81 MG EC tablet Take 1 tablet (81 mg total) by mouth daily.     Calcium Carb-Cholecalciferol 600-800 MG-UNIT TABS Take 1 tablet by mouth in the morning, at noon, and at bedtime.      docusate sodium (COLACE) 100 MG capsule Take 100 mg by mouth daily.     famotidine (PEPCID AC) 10 MG chewable tablet Chew 20 mg by mouth daily as needed for heartburn.      ferrous sulfate 325 (65 FE) MG tablet Take 325 mg by mouth daily with breakfast.     fluticasone (FLOVENT HFA) 110 MCG/ACT inhaler Inhale 2 puffs into the lungs 2 (two) times daily. 36 g 1   furosemide (LASIX) 20 MG tablet TAKE 1 TABLET DAILY, MAY  TAKE AN ADDITIONAL TAB AS  NEEDED FOR WEIGHT GAIN OF 2 LBS  OVERNIGHT OR 5 LBS IN 1 WEEK 90 tablet 3   levothyroxine (SYNTHROID) 150 MCG tablet Take 1 tablet (150 mcg total) by mouth daily before breakfast. 90 tablet 1   loratadine (CLARITIN) 10 MG tablet Take 10 mg by mouth daily.     losartan (COZAAR) 50 MG tablet Take 1 tablet (50 mg total) by mouth daily. 90 tablet 1   OXYGEN Inhale 2 L into the lungs at bedtime as needed (at bedtime and as needed).     oxymetazoline (AFRIN) 0.05 % nasal spray Place 1 spray into both nostrils 2 (two) times daily. As needed for nose bleed     Tiotropium Bromide-Olodaterol (STIOLTO RESPIMAT) 2.5-2.5 MCG/ACT AERS INHALE  2  PUFFS  INTO  THE  LUNGS EVERY DAY 12 g 1   traMADol  (ULTRAM) 50 MG tablet Take 1-2 tablets (50-100 mg total) by mouth every 4 (four) hours as needed for moderate pain. 30 tablet 0   vitamin B-12 (CYANOCOBALAMIN) 1000 MCG tablet Take 1 tablet (1,000 mcg total) by mouth daily. 90 tablet 3   Vitamin D, Ergocalciferol, (DRISDOL) 1.25 MG (50000 UNIT) CAPS capsule Take 1 capsule (50,000 Units total) by mouth once a week. 12 capsule 0   polyethylene glycol powder (GLYCOLAX/MIRALAX) powder Take 17 g by mouth 2 (two) times daily as needed. (Patient not taking: Reported on 04/29/2021) 3350 g 1   No facility-administered medications prior to visit.    Review of Systems;  Patient denies headache, fevers, malaise, unintentional weight loss, skin rash, eye pain, sinus congestion and sinus pain, sore throat, dysphagia,  hemoptysis , cough, dyspnea, wheezing, chest pain, palpitations, orthopnea, edema, abdominal pain, nausea, melena, diarrhea, constipation, flank pain, dysuria, hematuria, urinary  Frequency, nocturia, numbness, tingling, seizures,  Focal weakness, Loss of consciousness,  Tremor, insomnia, depression, anxiety, and suicidal ideation.      Objective:  BP 112/80 (BP Location: Left Arm, Patient Position: Sitting, Cuff Size: Large)    Pulse 89    Temp 97.9 F (36.6 C) (Oral)    Ht 5\' 2"  (1.575 m)    Wt 256 lb 6.4 oz (116.3 kg)    SpO2 97%    BMI 46.90 kg/m   BP Readings from Last 3 Encounters:  04/29/21 112/80  04/04/21 120/82  12/20/20 122/80    Wt Readings from Last 3 Encounters:  04/29/21 256 lb 6.4 oz (116.3 kg)  04/04/21 254 lb 12.8 oz (115.6 kg)  02/18/21 250 lb (113.4 kg)    General appearance: alert, cooperative and appears stated age Ears: normal TM's and external ear canals both ears Throat: lips, mucosa, and tongue normal; teeth and gums normal Neck: no adenopathy, no carotid bruit, supple, symmetrical, trachea midline and thyroid not enlarged, symmetric, no tenderness/mass/nodules Back: symmetric, no curvature. ROM normal. No  CVA tenderness. Lungs: clear to auscultation bilaterally Heart: regular rate and rhythm, S1, S2 normal, no murmur, click, rub or gallop Abdomen: soft, non-tender; bowel sounds normal; no masses,  no organomegaly Pulses: 2+ and symmetric ,  faint bilaterally in feet.  Cap refill brisk.  Skin: Skin color, texture, turgor normal. No rashes or lesions Lymph nodes: Cervical, supraclavicular, and axillary nodes normal.  Lab Results  Component Value Date   HGBA1C 5.6 12/20/2020   HGBA1C 5.5 01/26/2019   HGBA1C 5.3 07/30/2017    Lab Results  Component Value Date   CREATININE 0.91 04/29/2021   CREATININE 0.97 12/20/2020   CREATININE 0.88 11/26/2020    Lab Results  Component Value Date  WBC 8.4 11/26/2020   HGB 14.8 11/26/2020   HCT 44.7 11/26/2020   PLT 167 11/26/2020   GLUCOSE 82 04/29/2021   CHOL 192 04/29/2021   TRIG 92.0 04/29/2021   HDL 59.90 04/29/2021   LDLDIRECT 101.0 07/30/2017   LDLCALC 114 (H) 04/29/2021   ALT 19 04/29/2021   AST 20 04/29/2021   NA 144 04/29/2021   K 3.8 04/29/2021   CL 102 04/29/2021   CREATININE 0.91 04/29/2021   BUN 13 04/29/2021   CO2 32 04/29/2021   TSH 0.65 04/29/2021   INR 1.1 (H) 06/13/2020   HGBA1C 5.6 12/20/2020    Assessment & Plan:   Problem List Items Addressed This Visit     Hypothyroidism (Chronic)   Relevant Orders   TSH (Completed)   Vitamin D deficiency (Chronic)   Relevant Orders   VITAMIN D 25 Hydroxy (Vit-D Deficiency, Fractures) (Completed)   Morbid obesity ([place])    With concurrent hypotension and chronic nocturnal hypoxia.  Encouraged to make better choices for meals,  Resume walking daily for 30 minutes.  Was able to lose  35 lbs in 2018 with regular exercise.  Screening for DM has been done and a1c is still 5.6  Lab Results  Component Value Date   HGBA1C 5.6 12/20/2020   Lab Results  Component Value Date   TSH 0.95 12/20/2020   Lab Results  Component Value Date   CHOL 179 06/13/2020   HDL 60.60  06/13/2020   LDLCALC 102 (H) 06/13/2020   LDLDIRECT 101.0 07/30/2017   TRIG 82.0 06/13/2020   CHOLHDL 3 06/13/2020          Hypocalcemia    HER LEVEL IS < 8. Confirmed  WITH IONIZED CALCIUM,  Normal  magnesium.  Seen by Dr Honor Junes last week Advised to continue D/calcium supplements and recheck.   Will start calcitriol   Lab Results  Component Value Date   CALCIUM 7.4 (L) 04/29/2021   PHOS 4.2 06/11/2019        Relevant Medications   calcitRIOL (ROCALTROL) 0.25 MCG capsule   Other Relevant Orders   Calcium, ionized (Completed)   Comprehensive metabolic panel (Completed)   Magnesium (Completed)   Nocturnal hypoxemia due to obstructive chronic bronchitis ([facility])    She Continues to require nocturnal use of supplemental oxygen at 2 L/min      PAF (paroxysmal atrial fibrillation) ([place])    She is in sinsu rhythm today and denies racing heart,  Dyspnea except with walking,  But is severely deconditioned       Aortic atherosclerosis ([place])    She has deferred statin use repeatedly      Relevant Orders   Lipid panel (Completed)   Unsteady gait when walking - Primary    Leading to fear of falling and reduced physical activity .  She denies dizziness, vertigo, and orthostasis.  Home PT ordered to strengthen proximal muscles,  Improve balance and assess need for walker       Relevant Orders   Ambulatory referral to [place]   Other Visit Diagnoses     Bilateral leg weakness       Relevant Orders   Ambulatory referral to [place] problem       Relevant Orders   Ambulatory referral to [place]       I spent 40 minutes dedicated to the care of this patient on the date of this encounter to include pre-visit review of patient's medical history,  most  recent imaging studies, Face-to-face time with the patient , and post visit ordering of testing and therapeutics.    Follow-up: No follow-ups on file.   [name], MD

## 2021-04-29 NOTE — Patient Instructions (Addendum)
Ask the visiting Lauren Bartlett to walk with you  every day for up to 30 minutes   I have ordered home physical therapy to evaluate your need for a walker   I am checking your cholesterol today too and will  recommend medication if your LDL is > 70, because you are at increased risk of stroke because you have aortic atherosclerosis.   The Tdap (tetanus-diphtheria-whooping cough vaccine ) and Shingrix vaccines are now  COVERED BY MEDICARE if you get them at your pharmacy with the scripts I have given you    You can expect 24 hours of flu like symptoms after receiving the shingles vaccine, so plan accordingly.

## 2021-04-29 NOTE — Assessment & Plan Note (Signed)
She Continues to require nocturnal use of supplemental oxygen at 2 L/min

## 2021-04-29 NOTE — Assessment & Plan Note (Signed)
She is in sinsu rhythm today and denies racing heart,  Dyspnea except with walking,  But is severely deconditioned

## 2021-04-29 NOTE — Assessment & Plan Note (Signed)
Leading to fear of falling and reduced physical activity .  She denies dizziness, vertigo, and orthostasis.  Home PT ordered to strengthen proximal muscles,  Improve balance and assess need for walker

## 2021-04-30 LAB — CALCIUM, IONIZED: Calcium, Ion: 3.7 mg/dL — ABNORMAL LOW (ref 4.8–5.6)

## 2021-05-01 ENCOUNTER — Other Ambulatory Visit: Payer: Self-pay | Admitting: Internal Medicine

## 2021-05-01 ENCOUNTER — Telehealth: Payer: Self-pay | Admitting: Internal Medicine

## 2021-05-01 DIAGNOSIS — E039 Hypothyroidism, unspecified: Secondary | ICD-10-CM | POA: Diagnosis not present

## 2021-05-01 DIAGNOSIS — Z9181 History of falling: Secondary | ICD-10-CM | POA: Diagnosis not present

## 2021-05-01 DIAGNOSIS — I700 Atherosclerosis of aorta: Secondary | ICD-10-CM | POA: Diagnosis not present

## 2021-05-01 DIAGNOSIS — E559 Vitamin D deficiency, unspecified: Secondary | ICD-10-CM | POA: Diagnosis not present

## 2021-05-01 DIAGNOSIS — I10 Essential (primary) hypertension: Secondary | ICD-10-CM | POA: Diagnosis not present

## 2021-05-01 DIAGNOSIS — J449 Chronic obstructive pulmonary disease, unspecified: Secondary | ICD-10-CM | POA: Diagnosis not present

## 2021-05-01 DIAGNOSIS — Z7982 Long term (current) use of aspirin: Secondary | ICD-10-CM | POA: Diagnosis not present

## 2021-05-01 DIAGNOSIS — I480 Paroxysmal atrial fibrillation: Secondary | ICD-10-CM | POA: Diagnosis not present

## 2021-05-01 MED ORDER — CALCITRIOL 0.25 MCG PO CAPS: 0.2500 ug | ORAL_CAPSULE | Freq: Every day | ORAL | 1 refills | Status: DC

## 2021-05-01 NOTE — Telephone Encounter (Signed)
Spoke with pt and informed her of her lab results and the medication that has been sent to pharmacy. Pt stated that she will call the office back to schedule the lab appt after she gets the medication started. Labs have been faxed to Dr. Ladell Pier.

## 2021-05-01 NOTE — Telephone Encounter (Signed)
I reviewed Dr. Sherren Mocha note from last week and he had advised starting calcitriol if her calcium level dropped.  So I have sent anrx for calcitriol to her pharmacy.  Continue calcium and vitamin D   Recheck calcium level 2 weeks

## 2021-05-01 NOTE — Addendum Note (Signed)
Addended by: [name] on: 05/01/2021 01:25 PM   Modules accepted: Orders

## 2021-05-07 DIAGNOSIS — I480 Paroxysmal atrial fibrillation: Secondary | ICD-10-CM | POA: Diagnosis not present

## 2021-05-07 DIAGNOSIS — E559 Vitamin D deficiency, unspecified: Secondary | ICD-10-CM | POA: Diagnosis not present

## 2021-05-07 DIAGNOSIS — J449 Chronic obstructive pulmonary disease, unspecified: Secondary | ICD-10-CM | POA: Diagnosis not present

## 2021-05-07 DIAGNOSIS — Z9181 History of falling: Secondary | ICD-10-CM | POA: Diagnosis not present

## 2021-05-07 DIAGNOSIS — I10 Essential (primary) hypertension: Secondary | ICD-10-CM | POA: Diagnosis not present

## 2021-05-07 DIAGNOSIS — Z7982 Long term (current) use of aspirin: Secondary | ICD-10-CM | POA: Diagnosis not present

## 2021-05-07 DIAGNOSIS — I700 Atherosclerosis of aorta: Secondary | ICD-10-CM | POA: Diagnosis not present

## 2021-05-07 DIAGNOSIS — E039 Hypothyroidism, unspecified: Secondary | ICD-10-CM | POA: Diagnosis not present

## 2021-05-08 ENCOUNTER — Other Ambulatory Visit: Payer: Self-pay

## 2021-05-08 DIAGNOSIS — E559 Vitamin D deficiency, unspecified: Secondary | ICD-10-CM | POA: Diagnosis not present

## 2021-05-08 DIAGNOSIS — I700 Atherosclerosis of aorta: Secondary | ICD-10-CM | POA: Diagnosis not present

## 2021-05-08 DIAGNOSIS — E039 Hypothyroidism, unspecified: Secondary | ICD-10-CM | POA: Diagnosis not present

## 2021-05-08 DIAGNOSIS — Z7982 Long term (current) use of aspirin: Secondary | ICD-10-CM | POA: Diagnosis not present

## 2021-05-08 DIAGNOSIS — I480 Paroxysmal atrial fibrillation: Secondary | ICD-10-CM | POA: Diagnosis not present

## 2021-05-08 DIAGNOSIS — I10 Essential (primary) hypertension: Secondary | ICD-10-CM | POA: Diagnosis not present

## 2021-05-08 DIAGNOSIS — J449 Chronic obstructive pulmonary disease, unspecified: Secondary | ICD-10-CM | POA: Diagnosis not present

## 2021-05-08 DIAGNOSIS — Z9181 History of falling: Secondary | ICD-10-CM | POA: Diagnosis not present

## 2021-05-10 DIAGNOSIS — Z9181 History of falling: Secondary | ICD-10-CM | POA: Diagnosis not present

## 2021-05-10 DIAGNOSIS — E039 Hypothyroidism, unspecified: Secondary | ICD-10-CM | POA: Diagnosis not present

## 2021-05-10 DIAGNOSIS — I480 Paroxysmal atrial fibrillation: Secondary | ICD-10-CM | POA: Diagnosis not present

## 2021-05-10 DIAGNOSIS — E559 Vitamin D deficiency, unspecified: Secondary | ICD-10-CM | POA: Diagnosis not present

## 2021-05-10 DIAGNOSIS — I10 Essential (primary) hypertension: Secondary | ICD-10-CM | POA: Diagnosis not present

## 2021-05-10 DIAGNOSIS — I700 Atherosclerosis of aorta: Secondary | ICD-10-CM | POA: Diagnosis not present

## 2021-05-10 DIAGNOSIS — J449 Chronic obstructive pulmonary disease, unspecified: Secondary | ICD-10-CM | POA: Diagnosis not present

## 2021-05-10 DIAGNOSIS — Z7982 Long term (current) use of aspirin: Secondary | ICD-10-CM | POA: Diagnosis not present

## 2021-05-13 DIAGNOSIS — I10 Essential (primary) hypertension: Secondary | ICD-10-CM | POA: Diagnosis not present

## 2021-05-13 DIAGNOSIS — I480 Paroxysmal atrial fibrillation: Secondary | ICD-10-CM | POA: Diagnosis not present

## 2021-05-13 DIAGNOSIS — I700 Atherosclerosis of aorta: Secondary | ICD-10-CM | POA: Diagnosis not present

## 2021-05-13 DIAGNOSIS — E559 Vitamin D deficiency, unspecified: Secondary | ICD-10-CM | POA: Diagnosis not present

## 2021-05-13 DIAGNOSIS — J449 Chronic obstructive pulmonary disease, unspecified: Secondary | ICD-10-CM | POA: Diagnosis not present

## 2021-05-13 DIAGNOSIS — E039 Hypothyroidism, unspecified: Secondary | ICD-10-CM | POA: Diagnosis not present

## 2021-05-13 DIAGNOSIS — Z7982 Long term (current) use of aspirin: Secondary | ICD-10-CM | POA: Diagnosis not present

## 2021-05-13 DIAGNOSIS — Z9181 History of falling: Secondary | ICD-10-CM | POA: Diagnosis not present

## 2021-05-14 DIAGNOSIS — I10 Essential (primary) hypertension: Secondary | ICD-10-CM | POA: Diagnosis not present

## 2021-05-14 DIAGNOSIS — J449 Chronic obstructive pulmonary disease, unspecified: Secondary | ICD-10-CM | POA: Diagnosis not present

## 2021-05-14 DIAGNOSIS — E039 Hypothyroidism, unspecified: Secondary | ICD-10-CM | POA: Diagnosis not present

## 2021-05-14 DIAGNOSIS — I480 Paroxysmal atrial fibrillation: Secondary | ICD-10-CM | POA: Diagnosis not present

## 2021-05-14 DIAGNOSIS — Z9181 History of falling: Secondary | ICD-10-CM | POA: Diagnosis not present

## 2021-05-14 DIAGNOSIS — I700 Atherosclerosis of aorta: Secondary | ICD-10-CM | POA: Diagnosis not present

## 2021-05-14 DIAGNOSIS — Z7982 Long term (current) use of aspirin: Secondary | ICD-10-CM | POA: Diagnosis not present

## 2021-05-14 DIAGNOSIS — E559 Vitamin D deficiency, unspecified: Secondary | ICD-10-CM | POA: Diagnosis not present

## 2021-05-16 ENCOUNTER — Telehealth: Payer: Self-pay | Admitting: Internal Medicine

## 2021-05-16 NOTE — Telephone Encounter (Signed)
Faxed orders today at 11:48 am.

## 2021-05-16 NOTE — Telephone Encounter (Signed)
Sammie Bench from [facility] called about a order dated 2/10, order # [id number]. They have not received the order. Fax number is [phone]. Phone number is [phone]

## 2021-05-20 DIAGNOSIS — I480 Paroxysmal atrial fibrillation: Secondary | ICD-10-CM | POA: Diagnosis not present

## 2021-05-20 DIAGNOSIS — I700 Atherosclerosis of aorta: Secondary | ICD-10-CM | POA: Diagnosis not present

## 2021-05-20 DIAGNOSIS — J449 Chronic obstructive pulmonary disease, unspecified: Secondary | ICD-10-CM | POA: Diagnosis not present

## 2021-05-20 DIAGNOSIS — E039 Hypothyroidism, unspecified: Secondary | ICD-10-CM | POA: Diagnosis not present

## 2021-05-20 DIAGNOSIS — E559 Vitamin D deficiency, unspecified: Secondary | ICD-10-CM | POA: Diagnosis not present

## 2021-05-20 DIAGNOSIS — Z7982 Long term (current) use of aspirin: Secondary | ICD-10-CM | POA: Diagnosis not present

## 2021-05-20 DIAGNOSIS — Z9181 History of falling: Secondary | ICD-10-CM | POA: Diagnosis not present

## 2021-05-20 DIAGNOSIS — I10 Essential (primary) hypertension: Secondary | ICD-10-CM | POA: Diagnosis not present

## 2021-05-21 ENCOUNTER — Telehealth: Payer: Self-pay | Admitting: Internal Medicine

## 2021-05-21 DIAGNOSIS — E559 Vitamin D deficiency, unspecified: Secondary | ICD-10-CM | POA: Diagnosis not present

## 2021-05-21 DIAGNOSIS — Z9181 History of falling: Secondary | ICD-10-CM | POA: Diagnosis not present

## 2021-05-21 DIAGNOSIS — I480 Paroxysmal atrial fibrillation: Secondary | ICD-10-CM | POA: Diagnosis not present

## 2021-05-21 DIAGNOSIS — I10 Essential (primary) hypertension: Secondary | ICD-10-CM | POA: Diagnosis not present

## 2021-05-21 DIAGNOSIS — I700 Atherosclerosis of aorta: Secondary | ICD-10-CM | POA: Diagnosis not present

## 2021-05-21 DIAGNOSIS — Z7982 Long term (current) use of aspirin: Secondary | ICD-10-CM | POA: Diagnosis not present

## 2021-05-21 DIAGNOSIS — E039 Hypothyroidism, unspecified: Secondary | ICD-10-CM | POA: Diagnosis not present

## 2021-05-21 DIAGNOSIS — J449 Chronic obstructive pulmonary disease, unspecified: Secondary | ICD-10-CM | POA: Diagnosis not present

## 2021-05-27 ENCOUNTER — Other Ambulatory Visit: Payer: Self-pay

## 2021-05-27 ENCOUNTER — Other Ambulatory Visit (INDEPENDENT_AMBULATORY_CARE_PROVIDER_SITE_OTHER): Payer: Medicare Other

## 2021-05-27 LAB — BASIC METABOLIC PANEL
BUN: 18 mg/dL (ref 6–23)
CO2: 33 mEq/L — ABNORMAL HIGH (ref 19–32)
Calcium: 8.7 mg/dL (ref 8.4–10.5)
Chloride: 102 mEq/L (ref 96–112)
Creatinine, Ser: 0.97 mg/dL (ref 0.40–1.20)
GFR: 61.33 mL/min (ref >60.00)
Glucose, Bld: 67 mg/dL — ABNORMAL LOW (ref 70–99)
Potassium: 3.7 mEq/L (ref 3.5–5.1)
Sodium: 143 mEq/L (ref 135–145)

## 2021-05-27 MED ORDER — VITAMIN D (ERGOCALCIFEROL) 1.25 MG (50000 UNIT) PO CAPS: 50000.0000 [IU] | ORAL_CAPSULE | ORAL | 0 refills | Status: DC

## 2021-05-27 NOTE — Telephone Encounter (Signed)
Medication has been refilled.

## 2021-05-27 NOTE — Telephone Encounter (Signed)
Pt need refill on vitamin D sent to [facility] [place]

## 2021-05-27 NOTE — Addendum Note (Signed)
Addended by: Adair Laundry on: 05/27/2021 10:45 AM   Modules accepted: Orders

## 2021-05-28 DIAGNOSIS — Z7982 Long term (current) use of aspirin: Secondary | ICD-10-CM | POA: Diagnosis not present

## 2021-05-28 DIAGNOSIS — J449 Chronic obstructive pulmonary disease, unspecified: Secondary | ICD-10-CM | POA: Diagnosis not present

## 2021-05-28 DIAGNOSIS — Z9181 History of falling: Secondary | ICD-10-CM | POA: Diagnosis not present

## 2021-05-28 DIAGNOSIS — I10 Essential (primary) hypertension: Secondary | ICD-10-CM | POA: Diagnosis not present

## 2021-05-28 DIAGNOSIS — E039 Hypothyroidism, unspecified: Secondary | ICD-10-CM | POA: Diagnosis not present

## 2021-05-28 DIAGNOSIS — E559 Vitamin D deficiency, unspecified: Secondary | ICD-10-CM | POA: Diagnosis not present

## 2021-05-28 DIAGNOSIS — I700 Atherosclerosis of aorta: Secondary | ICD-10-CM | POA: Diagnosis not present

## 2021-05-28 DIAGNOSIS — I480 Paroxysmal atrial fibrillation: Secondary | ICD-10-CM | POA: Diagnosis not present

## 2021-05-28 LAB — CALCIUM, IONIZED: Calcium, Ion: 4.6 mg/dL — ABNORMAL LOW (ref 4.8–5.6)

## 2021-06-04 ENCOUNTER — Other Ambulatory Visit: Payer: Self-pay | Admitting: Internal Medicine

## 2021-06-04 DIAGNOSIS — I10 Essential (primary) hypertension: Secondary | ICD-10-CM | POA: Diagnosis not present

## 2021-06-04 DIAGNOSIS — E034 Atrophy of thyroid (acquired): Secondary | ICD-10-CM

## 2021-06-04 DIAGNOSIS — R040 Epistaxis: Secondary | ICD-10-CM | POA: Diagnosis not present

## 2021-06-04 DIAGNOSIS — J340 Abscess, furuncle and carbuncle of nose: Secondary | ICD-10-CM | POA: Diagnosis not present

## 2021-06-05 DIAGNOSIS — J449 Chronic obstructive pulmonary disease, unspecified: Secondary | ICD-10-CM | POA: Diagnosis not present

## 2021-06-07 DIAGNOSIS — I480 Paroxysmal atrial fibrillation: Secondary | ICD-10-CM | POA: Diagnosis not present

## 2021-06-07 DIAGNOSIS — J449 Chronic obstructive pulmonary disease, unspecified: Secondary | ICD-10-CM | POA: Diagnosis not present

## 2021-06-07 DIAGNOSIS — I10 Essential (primary) hypertension: Secondary | ICD-10-CM | POA: Diagnosis not present

## 2021-06-07 DIAGNOSIS — Z9181 History of falling: Secondary | ICD-10-CM | POA: Diagnosis not present

## 2021-06-07 DIAGNOSIS — Z7982 Long term (current) use of aspirin: Secondary | ICD-10-CM | POA: Diagnosis not present

## 2021-06-07 DIAGNOSIS — E039 Hypothyroidism, unspecified: Secondary | ICD-10-CM | POA: Diagnosis not present

## 2021-06-07 DIAGNOSIS — E559 Vitamin D deficiency, unspecified: Secondary | ICD-10-CM | POA: Diagnosis not present

## 2021-06-07 DIAGNOSIS — I700 Atherosclerosis of aorta: Secondary | ICD-10-CM | POA: Diagnosis not present

## 2021-06-08 DIAGNOSIS — I480 Paroxysmal atrial fibrillation: Secondary | ICD-10-CM | POA: Diagnosis not present

## 2021-06-08 DIAGNOSIS — I700 Atherosclerosis of aorta: Secondary | ICD-10-CM | POA: Diagnosis not present

## 2021-06-08 DIAGNOSIS — I10 Essential (primary) hypertension: Secondary | ICD-10-CM | POA: Diagnosis not present

## 2021-06-08 DIAGNOSIS — E559 Vitamin D deficiency, unspecified: Secondary | ICD-10-CM | POA: Diagnosis not present

## 2021-06-08 DIAGNOSIS — Z7982 Long term (current) use of aspirin: Secondary | ICD-10-CM | POA: Diagnosis not present

## 2021-06-08 DIAGNOSIS — E039 Hypothyroidism, unspecified: Secondary | ICD-10-CM | POA: Diagnosis not present

## 2021-06-08 DIAGNOSIS — Z9181 History of falling: Secondary | ICD-10-CM | POA: Diagnosis not present

## 2021-06-08 DIAGNOSIS — J449 Chronic obstructive pulmonary disease, unspecified: Secondary | ICD-10-CM | POA: Diagnosis not present

## 2021-06-10 DIAGNOSIS — J449 Chronic obstructive pulmonary disease, unspecified: Secondary | ICD-10-CM | POA: Diagnosis not present

## 2021-06-10 DIAGNOSIS — E039 Hypothyroidism, unspecified: Secondary | ICD-10-CM | POA: Diagnosis not present

## 2021-06-10 DIAGNOSIS — E559 Vitamin D deficiency, unspecified: Secondary | ICD-10-CM | POA: Diagnosis not present

## 2021-06-10 DIAGNOSIS — I700 Atherosclerosis of aorta: Secondary | ICD-10-CM | POA: Diagnosis not present

## 2021-06-10 DIAGNOSIS — I10 Essential (primary) hypertension: Secondary | ICD-10-CM | POA: Diagnosis not present

## 2021-06-10 DIAGNOSIS — Z9181 History of falling: Secondary | ICD-10-CM | POA: Diagnosis not present

## 2021-06-10 DIAGNOSIS — I480 Paroxysmal atrial fibrillation: Secondary | ICD-10-CM | POA: Diagnosis not present

## 2021-06-10 DIAGNOSIS — Z7982 Long term (current) use of aspirin: Secondary | ICD-10-CM | POA: Diagnosis not present

## 2021-06-18 ENCOUNTER — Telehealth: Payer: Self-pay | Admitting: Internal Medicine

## 2021-06-18 ENCOUNTER — Emergency Department
Admission: EM | Admit: 2021-06-18 | Discharge: 2021-06-18 | Disposition: A | Discharge status: Home / Self Care | Payer: Medicare Other | Attending: Emergency Medicine | Admitting: Emergency Medicine

## 2021-06-18 ENCOUNTER — Emergency Department: Payer: Medicare Other

## 2021-06-18 DIAGNOSIS — R1084 Generalized abdominal pain: Secondary | ICD-10-CM | POA: Diagnosis not present

## 2021-06-18 DIAGNOSIS — Q600 Renal agenesis, unilateral: Secondary | ICD-10-CM | POA: Diagnosis not present

## 2021-06-18 DIAGNOSIS — S39011A Strain of muscle, fascia and tendon of abdomen, initial encounter: Secondary | ICD-10-CM

## 2021-06-18 DIAGNOSIS — R350 Frequency of micturition: Secondary | ICD-10-CM | POA: Diagnosis not present

## 2021-06-18 DIAGNOSIS — M160 Bilateral primary osteoarthritis of hip: Secondary | ICD-10-CM | POA: Diagnosis not present

## 2021-06-18 DIAGNOSIS — R079 Chest pain, unspecified: Secondary | ICD-10-CM | POA: Diagnosis not present

## 2021-06-18 DIAGNOSIS — K5730 Diverticulosis of large intestine without perforation or abscess without bleeding: Secondary | ICD-10-CM | POA: Diagnosis not present

## 2021-06-18 DIAGNOSIS — R1030 Lower abdominal pain, unspecified: Secondary | ICD-10-CM | POA: Diagnosis not present

## 2021-06-18 DIAGNOSIS — S3991XA Unspecified injury of abdomen, initial encounter: Secondary | ICD-10-CM | POA: Diagnosis present

## 2021-06-18 DIAGNOSIS — I700 Atherosclerosis of aorta: Secondary | ICD-10-CM | POA: Diagnosis not present

## 2021-06-18 DIAGNOSIS — R109 Unspecified abdominal pain: Secondary | ICD-10-CM

## 2021-06-18 DIAGNOSIS — E039 Hypothyroidism, unspecified: Secondary | ICD-10-CM | POA: Diagnosis not present

## 2021-06-18 DIAGNOSIS — I10 Essential (primary) hypertension: Secondary | ICD-10-CM | POA: Insufficient documentation

## 2021-06-18 DIAGNOSIS — R300 Dysuria: Secondary | ICD-10-CM | POA: Diagnosis not present

## 2021-06-18 DIAGNOSIS — J449 Chronic obstructive pulmonary disease, unspecified: Secondary | ICD-10-CM | POA: Diagnosis not present

## 2021-06-18 DIAGNOSIS — X58XXXA Exposure to other specified factors, initial encounter: Secondary | ICD-10-CM | POA: Diagnosis not present

## 2021-06-18 DIAGNOSIS — R1012 Left upper quadrant pain: Secondary | ICD-10-CM | POA: Diagnosis not present

## 2021-06-18 DIAGNOSIS — R0789 Other chest pain: Secondary | ICD-10-CM | POA: Diagnosis not present

## 2021-06-18 LAB — URINALYSIS, ROUTINE W REFLEX MICROSCOPIC
Bilirubin Urine: NEGATIVE
Glucose, UA: NEGATIVE mg/dL
Ketones, ur: NEGATIVE mg/dL
Nitrite: NEGATIVE
Protein, ur: 30 mg/dL — AB
Specific Gravity, Urine: 1.018 (ref 1.005–1.030)
pH: 5 (ref 5.0–8.0)

## 2021-06-18 LAB — CBC WITH DIFFERENTIAL/PLATELET
Abs Immature Granulocytes: 0.01 10*3/uL (ref 0.00–0.07)
Basophils Absolute: 0 10*3/uL (ref 0.0–0.1)
Basophils Relative: 1 %
Eosinophils Absolute: 0.2 10*3/uL (ref 0.0–0.5)
Eosinophils Relative: 5 %
HCT: 44.6 % (ref 36.0–46.0)
Hemoglobin: 14.1 g/dL (ref 12.0–15.0)
Immature Granulocytes: 0 %
Lymphocytes Relative: 20 %
Lymphs Abs: 1 10*3/uL (ref 0.7–4.0)
MCH: 29.9 pg (ref 26.0–34.0)
MCHC: 31.6 g/dL (ref 30.0–36.0)
MCV: 94.7 fL (ref 80.0–100.0)
Monocytes Absolute: 0.5 10*3/uL (ref 0.1–1.0)
Monocytes Relative: 10 %
Neutro Abs: 3.3 10*3/uL (ref 1.7–7.7)
Neutrophils Relative %: 64 %
Platelets: 168 10*3/uL (ref 150–400)
RBC: 4.71 MIL/uL (ref 3.87–5.11)
RDW: 13.2 % (ref 11.5–15.5)
WBC: 5.1 10*3/uL (ref 4.0–10.5)
nRBC: 0 % (ref 0.0–0.2)

## 2021-06-18 LAB — COMPREHENSIVE METABOLIC PANEL
ALT: 19 U/L (ref 0–44)
AST: 21 U/L (ref 15–41)
Albumin: 3.8 g/dL (ref 3.5–5.0)
Alkaline Phosphatase: 65 U/L (ref 38–126)
Anion gap: 10 (ref 5–15)
BUN: 13 mg/dL (ref 8–23)
CO2: 30 mmol/L (ref 22–32)
Calcium: 8.6 mg/dL — ABNORMAL LOW (ref 8.9–10.3)
Chloride: 99 mmol/L (ref 98–111)
Creatinine, Ser: 0.88 mg/dL (ref 0.44–1.00)
GFR, Estimated: >60 mL/min (ref >60)
Glucose, Bld: 100 mg/dL — ABNORMAL HIGH (ref 70–99)
Potassium: 3.8 mmol/L (ref 3.5–5.1)
Sodium: 139 mmol/L (ref 135–145)
Total Bilirubin: 0.7 mg/dL (ref 0.3–1.2)
Total Protein: 7.3 g/dL (ref 6.5–8.1)

## 2021-06-18 LAB — LIPASE, BLOOD: Lipase: 29 U/L (ref 11–51)

## 2021-06-18 MED ORDER — SULFAMETHOXAZOLE-TRIMETHOPRIM 800-160 MG PO TABS
1.0000 | ORAL_TABLET | Freq: Once | ORAL | Status: AC
  Administered 2021-06-18: 1 via ORAL
  Filled 2021-06-18: qty 1

## 2021-06-18 MED ORDER — LIDOCAINE 5 % EX PTCH: 1.0000 | MEDICATED_PATCH | Freq: Two times a day (BID) | CUTANEOUS | 0 refills | Status: AC

## 2021-06-18 MED ORDER — LIDOCAINE 5 % EX PTCH
1.0000 | MEDICATED_PATCH | CUTANEOUS | Status: DC
  Administered 2021-06-18: 1 via TRANSDERMAL
  Filled 2021-06-18: qty 1

## 2021-06-18 MED ORDER — SULFAMETHOXAZOLE-TRIMETHOPRIM 800-160 MG PO TABS: 1.0000 | ORAL_TABLET | Freq: Two times a day (BID) | ORAL | 0 refills | Status: DC

## 2021-06-18 NOTE — Telephone Encounter (Signed)
Pt called in stating she has pain on the left side of her body. Pt may think its a UTI. sent to access nurse ?

## 2021-06-18 NOTE — ED Notes (Signed)
Pt given hat in toilet to collect urine sample, pt had bowel movement in hat with urine sample, unable to obtain sample at this time. Stool dark in nature, MD Joni Fears notified and MD testing fecal occult sample on stool.  ?

## 2021-06-18 NOTE — ED Triage Notes (Signed)
Pt BIBA for left flank and LLQ ABD pain x 1 week s/p exercising. Pt states it sort of hurts when standing up to walk but denies pain on palaption. Pt states she has urinary urgency ?

## 2021-06-18 NOTE — ED Provider Notes (Signed)
? ?[facility] ?Provider Note ? ? ? Event Date/Time  ? First MD Initiated Contact with Patient 06/18/21 1028   ?  (approximate) ? ? ?History  ? ?Flank Pain (LLQ pain/) ? ? ?HPI ? ?Lauren Bartlett is a 66 y.o. female with a past medical history of hypertension, congenital solitary kidney, hypothyroidism, COPD who comes ED complaining of left flank pain for the past week.  Pain started after doing OT exercises with her walker and increasing the number of reps.  Denies chest pain or shortness of breath, no exertional symptoms.  No vomiting or diarrhea or constipation.  She does report some urinary frequency and dysuria.  No fever ?  ? ? ?Physical Exam  ? ?Triage Vital Signs: ?ED Triage Vitals  ?Enc Vitals Group  ?   BP 06/18/21 1021 (!) 154/102  ?   Pulse Rate 06/18/21 1021 89  ?   Resp 06/18/21 1021 20  ?   Temp 06/18/21 1021 97.9 ?F (36.6 ?C)  ?   Temp Source 06/18/21 1021 Oral  ?   SpO2 06/18/21 1021 100 %  ?   Weight 06/18/21 1023 253 lb (114.8 kg)  ?   Height 06/18/21 1023 '5\' 5"'$  (1.651 m)  ?   Head Circumference --   ?   Peak Flow --   ?   Pain Score 06/18/21 1020 3  ?   Pain Loc --   ?   Pain Edu? --   ?   Excl. in [place]? --   ? ? ?Most recent vital signs: ?Vitals:  ? 06/18/21 1330 06/18/21 1400  ?BP: (!) 105/53 123/71  ?Pulse: 86 84  ?Resp: 16 16  ?Temp:    ?SpO2: 97% 99%  ? ? ? ?General: Awake, no distress.  ?CV:  Good peripheral perfusion.  Regular rate and rhythm ?Resp:  Normal effort.  Clear to auscultation bilaterally ?Abd:  No distention.  Abdomen is soft and nontender, no peritoneal signs.  There is an area lateral to the left upper quadrant of focal muscular tenderness with a palpable knot in the abdominal wall musculature of the obliques which reproduces her pain ?Other:  No significant lower extremity edema or rash.  Moist oral mucosa. ? ? ?ED Results / Procedures / Treatments  ? ?Labs ?(all labs ordered are listed, but only abnormal results are displayed) ?Labs Reviewed   ?COMPREHENSIVE METABOLIC PANEL - Abnormal; Notable for the following components:  ?    Result Value  ? Glucose, Bld 100 (*)   ? Calcium 8.6 (*)   ? All other components within normal limits  ?URINALYSIS, ROUTINE W REFLEX MICROSCOPIC - Abnormal; Notable for the following components:  ? Color, Urine YELLOW (*)   ? APPearance CLOUDY (*)   ? Hgb urine dipstick MODERATE (*)   ? Protein, ur 30 (*)   ? Leukocytes,Ua MODERATE (*)   ? Bacteria, UA MANY (*)   ? All other components within normal limits  ?URINE CULTURE  ?CBC WITH DIFFERENTIAL/PLATELET  ?LIPASE, BLOOD  ? ? ? ?EKG ? ?Interpreted by me ?Sinus rhythm rate of 87.  Normal axis and intervals.  Poor R wave progression.  Normal ST segments and T waves.  No ischemic changes. ? ? ?RADIOLOGY ?CT abdomen pelvis viewed and interpreted by me, negative for ureterolithiasis, SBO, or free air.  Radiology report reviewed. ? ? ? ?PROCEDURES: ? ?Critical Care performed: No ? ?Procedures ? ? ?MEDICATIONS ORDERED IN ED: ?Medications  ?lidocaine (LIDODERM) 5 % 1  patch (1 patch Transdermal Patch Applied 06/18/21 1241)  ?sulfamethoxazole-trimethoprim (BACTRIM DS) 800-160 MG per tablet 1 tablet (1 tablet Oral Given 06/18/21 1420)  ? ? ? ?IMPRESSION / MDM / ASSESSMENT AND PLAN / ED COURSE  ?I reviewed the triage vital signs and the nursing notes. ?             ?               ? ?Differential diagnosis includes, but is not limited to, kidney stone, gastritis, diverticulitis, cystitis ? ?**The patient is on the cardiac monitor to evaluate for evidence of arrhythmia and/or significant heart rate changes.**} ? ?Clinical Course as of 06/18/21 1426  ?Tue Jun 18, 2021  ?1037 L abd side pain that started after doing increased OT exercises. Does have L lat LUQ tenderness with a palpable muscle not, c/w strain of abd olique.  [PS]  ?  ?Clinical Course User Index ?[PS] Carrie Mew, MD  ? ? ? ?----------------------------------------- ?2:29 PM on  06/18/2021 ?----------------------------------------- ?Urinalysis shows signs of UTI consistent with cystitis.  Vital signs are normal, no CVA tenderness, nontoxic.  Doubt pyelonephritis.  Not septic.  Will start on Bactrim given prior Klebsiella infection that was not susceptible to cephalosporins as seen on review of outside records.  Patient is not requiring admission because rest of the work-up is unremarkable and exam is overall reassuring. ? ?FINAL CLINICAL IMPRESSION(S) / ED DIAGNOSES  ? ?Final diagnoses:  ?Left flank pain  ?Abdominal muscle strain, initial encounter  ? ? ? ?Rx / DC Orders  ? ?ED Discharge Orders   ? ?      Ordered  ?  sulfamethoxazole-trimethoprim (BACTRIM DS) 800-160 MG tablet  2 times daily       ? 06/18/21 1425  ?  lidocaine (LIDODERM) 5 %  Every 12 hours       ? 06/18/21 1425  ? ?  ?  ? ?  ? ? ? ?Note:  This document was prepared using Dragon voice recognition software and may include unintentional dictation errors. ?  ?Carrie Mew, MD ?06/18/21 1430 ? ?

## 2021-06-18 NOTE — Telephone Encounter (Signed)
Pt was advised to call 911 due to having the chest pain. Attempted to reach out to the pt to follow up no answer.  ?

## 2021-06-20 LAB — URINE CULTURE

## 2021-07-06 DIAGNOSIS — J449 Chronic obstructive pulmonary disease, unspecified: Secondary | ICD-10-CM | POA: Diagnosis not present

## 2021-07-11 ENCOUNTER — Ambulatory Visit (INDEPENDENT_AMBULATORY_CARE_PROVIDER_SITE_OTHER): Payer: Medicare Other | Admitting: Internal Medicine

## 2021-07-11 ENCOUNTER — Ambulatory Visit (INDEPENDENT_AMBULATORY_CARE_PROVIDER_SITE_OTHER): Payer: Medicare Other

## 2021-07-11 ENCOUNTER — Encounter: Payer: Self-pay | Admitting: Internal Medicine

## 2021-07-11 ENCOUNTER — Telehealth: Payer: Self-pay

## 2021-07-11 VITALS — BP 128/84 | HR 113 | Temp 98.3°F | Ht 65.0 in | Wt 249.4 lb

## 2021-07-11 DIAGNOSIS — R051 Acute cough: Secondary | ICD-10-CM | POA: Diagnosis not present

## 2021-07-11 DIAGNOSIS — J440 Chronic obstructive pulmonary disease with acute lower respiratory infection: Secondary | ICD-10-CM | POA: Diagnosis not present

## 2021-07-11 DIAGNOSIS — J209 Acute bronchitis, unspecified: Secondary | ICD-10-CM | POA: Insufficient documentation

## 2021-07-11 DIAGNOSIS — R300 Dysuria: Secondary | ICD-10-CM

## 2021-07-11 DIAGNOSIS — R062 Wheezing: Secondary | ICD-10-CM | POA: Diagnosis not present

## 2021-07-11 DIAGNOSIS — J9811 Atelectasis: Secondary | ICD-10-CM | POA: Diagnosis not present

## 2021-07-11 DIAGNOSIS — R0602 Shortness of breath: Secondary | ICD-10-CM | POA: Diagnosis not present

## 2021-07-11 LAB — CBC WITH DIFFERENTIAL/PLATELET
Basophils Absolute: 0.1 10*3/uL (ref 0.0–0.1)
Basophils Relative: 0.8 % (ref 0.0–3.0)
Eosinophils Absolute: 0.2 10*3/uL (ref 0.0–0.7)
Eosinophils Relative: 3.5 % (ref 0.0–5.0)
HCT: 44.3 % (ref 36.0–46.0)
Hemoglobin: 14.7 g/dL (ref 12.0–15.0)
Lymphocytes Relative: 12.3 % (ref 12.0–46.0)
Lymphs Abs: 0.8 10*3/uL (ref 0.7–4.0)
MCHC: 33.3 g/dL (ref 30.0–36.0)
MCV: 93.2 fl (ref 78.0–100.0)
Monocytes Absolute: 1 10*3/uL (ref 0.1–1.0)
Monocytes Relative: 15.5 % — ABNORMAL HIGH (ref 3.0–12.0)
Neutro Abs: 4.3 10*3/uL (ref 1.4–7.7)
Neutrophils Relative %: 67.9 % (ref 43.0–77.0)
Platelets: 188 10*3/uL (ref 150.0–400.0)
RBC: 4.75 Mil/uL (ref 3.87–5.11)
RDW: 14.2 % (ref 11.5–15.5)
WBC: 6.3 10*3/uL (ref 4.0–10.5)

## 2021-07-11 MED ORDER — ALBUTEROL SULFATE (2.5 MG/3ML) 0.083% IN NEBU: INHALATION_SOLUTION | RESPIRATORY_TRACT | 1 refills | Status: DC

## 2021-07-11 MED ORDER — CALCITRIOL 0.25 MCG PO CAPS: 0.2500 ug | ORAL_CAPSULE | Freq: Every day | ORAL | 1 refills | Status: DC

## 2021-07-11 MED ORDER — BENZONATATE 200 MG PO CAPS
200.0000 mg | ORAL_CAPSULE | Freq: Three times a day (TID) | ORAL | 0 refills | Status: DC | PRN
Start: 2021-07-11 — End: 2022-08-19

## 2021-07-11 MED ORDER — DOXYCYCLINE HYCLATE 100 MG PO TABS: 100.0000 mg | ORAL_TABLET | Freq: Two times a day (BID) | ORAL | 0 refills | Status: DC

## 2021-07-11 MED ORDER — METHYLPREDNISOLONE ACETATE 40 MG/ML IJ SUSP
40.0000 mg | Freq: Once | INTRAMUSCULAR | Status: AC
  Administered 2021-07-11: 80 mg via INTRAMUSCULAR

## 2021-07-11 NOTE — Telephone Encounter (Signed)
Patient called office and x-ray note from Dr Derrel Nip was read. Patient understood and will call back if symptoms develop. ?

## 2021-07-11 NOTE — Telephone Encounter (Signed)
LMTCB in regards to xray results.  ?

## 2021-07-11 NOTE — Assessment & Plan Note (Signed)
IM methylprednisolone 80 mg given.  Continue albuterol, flonase, increase claritin to bid and adding doxycycline.  cxr ordered; will add 2nd abx if infiltrate is seen  ?

## 2021-07-11 NOTE — Patient Instructions (Addendum)
Increase claritin to twice daily  ? ?Continue using your albuterol through your nebulizer. ? ?Continue using flonase ,  your steroid nasal spray ? ?Tessalon perles (prescribed) for the cough.  You can add or substitute Robitussin DM ? ?Doxycycline for the bronchitis/sinusitis  ? ?You received a steroid injection today  ? ? ? ? ?

## 2021-07-11 NOTE — Assessment & Plan Note (Signed)
Recent empiric UTI treatment by ER on March 21 reviewed.  UA was suggestive  But culture was negative  ?

## 2021-07-11 NOTE — Progress Notes (Signed)
? ?Subjective:  ?Patient ID: Lauren Bartlett, female    DOB: [birth year]  Age: 66 y.o. MRN: [id number] ? ?CC: The primary encounter diagnosis was Acute cough. Diagnoses of Hypocalcemia, Acute bronchitis with COPD ([place]), and Dysuria were also pertinent to this visit. ? ? ?This visit occurred during the SARS-CoV-2 public health emergency.  Safety protocols were in place, including screening questions prior to the visit, additional usage of staff PPE, and extensive cleaning of exam room while observing appropriate contact time as indicated for disinfecting solutions.   ? ?HPI ?Lauren Bartlett presents for  ER follow up ?Chief Complaint  ?Patient presents with  ? Follow-up  ?  Follow up from the ER on 06/18/2021. Pt was diagnosed with a UTI, prescribed Bactrim.  ? ?Treated empirically for UTI with Septra by ER on march 21.  Urine culture was negative for dominant species.   She tolerated the medication without diarrhea   Symptoms have resolved.  ? ?New onset cough , started with a sore throat on Monday .  Spent the weekend in  contact with lots of people at an outdoor revival .on Sunday.   Eyes  red,  sinuses hurting  coughing constantly dry cough,  some frontal headache .  Short of breath with exertion . Using nebulizer with albuterol.  No fevers or body aches.   ? ? ?Outpatient Medications Prior to Visit  ?Medication Sig Dispense Refill  ? acetaminophen (TYLENOL) 500 MG tablet Take 500 mg by mouth 2 (two) times daily as needed for moderate pain or headache.    ? albuterol (VENTOLIN HFA) 108 (90 Base) MCG/ACT inhaler INHALE 2 PUFFS INTO THE LUNGS EVERY 6 HOURS AS NEEDED FOR WHEEZING OR SHORTNESS OF BREATH (USE WITH SPACER) 1 each 1  ? aspirin EC 81 MG EC tablet Take 1 tablet (81 mg total) by mouth daily.    ? Calcium Carb-Cholecalciferol 600-800 MG-UNIT TABS Take 1 tablet by mouth in the morning, at noon, and at bedtime.     ? docusate sodium (COLACE) 100 MG capsule Take 100 mg by mouth daily.    ? famotidine (PEPCID AC) 10 MG  chewable tablet Chew 20 mg by mouth daily as needed for heartburn.     ? ferrous sulfate 325 (65 FE) MG tablet Take 325 mg by mouth daily with breakfast.    ? FLOVENT HFA 110 MCG/ACT inhaler USE 2 INHALATIONS BY MOUTH  TWICE DAILY 36 g 3  ? furosemide (LASIX) 20 MG tablet TAKE 1 TABLET DAILY, MAY  TAKE AN ADDITIONAL TAB AS  NEEDED FOR WEIGHT GAIN OF 2 LBS OVERNIGHT OR 5 LBS IN 1 WEEK 90 tablet 3  ? levothyroxine (SYNTHROID) 150 MCG tablet TAKE 1 TABLET BY MOUTH  DAILY BEFORE BREAKFAST 90 tablet 3  ? lidocaine (LIDODERM) 5 % Place 1 patch onto the skin every 12 (twelve) hours. Remove & Discard patch within 12 hours or as directed by MD 15 patch 0  ? loratadine (CLARITIN) 10 MG tablet Take 10 mg by mouth daily.    ? losartan (COZAAR) 50 MG tablet TAKE 1 TABLET BY MOUTH  DAILY 90 tablet 3  ? OXYGEN Inhale 2 L into the lungs at bedtime as needed (at bedtime and as needed).    ? oxymetazoline (AFRIN) 0.05 % nasal spray Place 1 spray into both nostrils 2 (two) times daily. As needed for nose bleed    ? polyethylene glycol powder (GLYCOLAX/MIRALAX) powder Take 17 g by mouth 2 (two) times daily as  needed. 3350 g 1  ? Tiotropium Bromide-Olodaterol (STIOLTO RESPIMAT) 2.5-2.5 MCG/ACT AERS INHALE 2 INHALATIONS BY  MOUTH INTO THE LUNGS DAILY 12 g 3  ? traMADol (ULTRAM) 50 MG tablet Take 1-2 tablets (50-100 mg total) by mouth every 4 (four) hours as needed for moderate pain. 30 tablet 0  ? vitamin B-12 (CYANOCOBALAMIN) 1000 MCG tablet Take 1 tablet (1,000 mcg total) by mouth daily. 90 tablet 3  ? Vitamin D, Ergocalciferol, (DRISDOL) 1.25 MG (50000 UNIT) CAPS capsule Take 1 capsule (50,000 Units total) by mouth once a week. 12 capsule 0  ? albuterol (PROVENTIL) (2.5 MG/3ML) 0.083% nebulizer solution INHALE THE CONTENTS OF 1 VIAL VIA NEBULIZER EVERY 6 HOURS AS NEEDED FOR WHEEZING  OR FOR SHORTNESS OF BREATH 90 mL 1  ? calcitRIOL (ROCALTROL) 0.25 MCG capsule Take 1 capsule (0.25 mcg total) by mouth daily. 30 capsule 1  ?  sulfamethoxazole-trimethoprim (BACTRIM DS) 800-160 MG tablet Take 1 tablet by mouth 2 (two) times daily. 14 tablet 0  ? ?No facility-administered medications prior to visit.  ? ? ?Review of Systems; ? ?Patient denies headache, fevers, malaise, unintentional weight loss, skin rash, eye pain, sinus congestion and sinus pain, sore throat, dysphagia,  hemoptysis , cough, dyspnea, wheezing, chest pain, palpitations, orthopnea, edema, abdominal pain, nausea, melena, diarrhea, constipation, flank pain, dysuria, hematuria, urinary  Frequency, nocturia, numbness, tingling, seizures,  Focal weakness, Loss of consciousness,  Tremor, insomnia, depression, anxiety, and suicidal ideation.   ? ? ? ?Objective:  ?BP 128/84 (BP Location: Left Arm, Patient Position: Sitting, Cuff Size: Large)   Pulse (!) 113   Temp 98.3 ?F (36.8 ?C) (Oral)   Ht '5\' 5"'$  (1.651 m)   Wt 249 lb 6.4 oz (113.1 kg)   SpO2 94%   BMI 41.50 kg/m?  ? ?BP Readings from Last 3 Encounters:  ?07/11/21 128/84  ?06/18/21 139/72  ?04/29/21 112/80  ? ? ?Wt Readings from Last 3 Encounters:  ?07/11/21 249 lb 6.4 oz (113.1 kg)  ?06/18/21 253 lb (114.8 kg)  ?04/29/21 256 lb 6.4 oz (116.3 kg)  ? ? ?General appearance: alert, cooperative and appears stated age ?Ears: normal TM's and external ear canals both ears ?Throat: lips, mucosa, and tongue normal; teeth and gums normal ?Neck: no adenopathy, no carotid bruit, supple, symmetrical, trachea midline and thyroid not enlarged, symmetric, no tenderness/mass/nodules ?Back: symmetric, no curvature. ROM normal. No CVA tenderness. ?Lungs: clear to auscultation bilaterally ?Heart: regular rate and rhythm, S1, S2 normal, no murmur, click, rub or gallop ?Abdomen: soft, non-tender; bowel sounds normal; no masses,  no organomegaly ?Pulses: 2+ and symmetric ?Skin: Skin color, texture, turgor normal. No rashes or lesions ?Lymph nodes: Cervical, supraclavicular, and axillary nodes normal. ? ?Lab Results  ?Component Value Date  ? HGBA1C  5.6 12/20/2020  ? HGBA1C 5.5 01/26/2019  ? HGBA1C 5.3 07/30/2017  ? ? ?Lab Results  ?Component Value Date  ? CREATININE 0.88 06/18/2021  ? CREATININE 0.97 05/27/2021  ? CREATININE 0.91 04/29/2021  ? ? ?Lab Results  ?Component Value Date  ? WBC 5.1 06/18/2021  ? HGB 14.1 06/18/2021  ? HCT 44.6 06/18/2021  ? PLT 168 06/18/2021  ? GLUCOSE 100 (H) 06/18/2021  ? CHOL 192 04/29/2021  ? TRIG 92.0 04/29/2021  ? HDL 59.90 04/29/2021  ? LDLDIRECT 101.0 07/30/2017  ? LDLCALC 114 (H) 04/29/2021  ? ALT 19 06/18/2021  ? AST 21 06/18/2021  ? NA 139 06/18/2021  ? K 3.8 06/18/2021  ? CL 99 06/18/2021  ? CREATININE 0.88 06/18/2021  ?  BUN 13 06/18/2021  ? CO2 30 06/18/2021  ? TSH 0.65 04/29/2021  ? INR 1.1 (H) 06/13/2020  ? HGBA1C 5.6 12/20/2020  ? ? ?CT Renal Stone Study ? ?Result Date: 06/18/2021 ?CLINICAL DATA:  Flank pain, suspected kidney stone in a 66 year old female. EXAM: CT ABDOMEN AND PELVIS WITHOUT CONTRAST TECHNIQUE: Multidetector CT imaging of the abdomen and pelvis was performed following the standard protocol without IV contrast. RADIATION DOSE REDUCTION: This exam was performed according to the departmental dose-optimization program which includes automated exposure control, adjustment of the mA and/or kV according to patient size and/or use of iterative reconstruction technique. COMPARISON:  None FINDINGS: Lower chest: Basilar atelectasis with elevation of the RIGHT hemidiaphragm that is been present on multiple prior studies. No effusion. No consolidative changes. Signs of mitral valve replacement. Cardiomegaly similar to prior imaging. No pericardial effusion. Cardiac structures are incompletely imaged. Hepatobiliary: Smooth mildly lobular hepatic contours. No pericholecystic stranding. No biliary duct dilation grossly on noncontrast imaging. Pancreas: Normal contour without signs of inflammation. Spleen: Normal. Adrenals/Urinary Tract: Adrenal glands are normal. Smooth contour of the RIGHT kidney.  LEFT kidney is  absent. Urinary bladder without signs of adjacent stranding. No hydronephrosis or nephrolithiasis. Stomach/Bowel: Stomach under distended no adjacent stranding. No sign of small bowel obstruction. Appendix not visuali

## 2021-07-11 NOTE — Telephone Encounter (Signed)
noted 

## 2021-07-11 NOTE — Telephone Encounter (Signed)
-----   Message from [name], MD sent at 07/11/2021  1:25 PM EDT ----- ?Your chest x ray is clear.  There is no sign of pneumonia so I do not feel you need a second antibiotic at this time, just the doxycycline.  If you develop fever (T > 100.4) or  diarrhea or worsening shortness of breath, pease let me know. ? ?Regards, ? ? ?Dr. Derrel Nip ?  ?

## 2021-07-18 ENCOUNTER — Ambulatory Visit (INDEPENDENT_AMBULATORY_CARE_PROVIDER_SITE_OTHER): Payer: Medicare Other | Admitting: Internal Medicine

## 2021-07-18 ENCOUNTER — Encounter: Payer: Self-pay | Admitting: Internal Medicine

## 2021-07-18 VITALS — BP 136/86 | HR 92 | Temp 97.6°F | Ht 65.0 in | Wt 249.4 lb

## 2021-07-18 DIAGNOSIS — J209 Acute bronchitis, unspecified: Secondary | ICD-10-CM

## 2021-07-18 DIAGNOSIS — J440 Chronic obstructive pulmonary disease with acute lower respiratory infection: Secondary | ICD-10-CM | POA: Diagnosis not present

## 2021-07-18 MED ORDER — METHYLPREDNISOLONE ACETATE 40 MG/ML IJ SUSP
40.0000 mg | Freq: Once | INTRAMUSCULAR | Status: AC
  Administered 2021-07-18: 40 mg via INTRAMUSCULAR

## 2021-07-18 NOTE — Patient Instructions (Addendum)
Please continue using your albuterol,  either through the nebulizer or through the metered dose inhaler (MDI/puffer) every 6  hours for at least the next week, and then as needed for wheezing ? ?Increase your allergy pill,  loratadine to every 12 hours during the pollen season (through July) ?

## 2021-07-18 NOTE — Progress Notes (Signed)
? ?Subjective:  ?Patient ID: Lauren Bartlett, female    DOB: [birth year]  Age: 66 y.o. MRN: [id number] ? ?CC: The encounter diagnosis was Acute bronchitis with COPD ([place]). ? ? ?This visit occurred during the SARS-CoV-2 public health emergency.  Safety protocols were in place, including screening questions prior to the visit, additional usage of staff PPE, and extensive cleaning of exam room while observing appropriate contact time as indicated for disinfecting solutions.   ? ?HPI ?Lauren Bartlett presents for  one week follow up on recent COPD exacerbation treated with IM steroids on April 13 .  Chest x ray was negative for infiltrates,  was treated with doxycycline and ICS, and bronchodilators.  She has had a slow recovery and felt the worse on Sunday April  16th,  but started feeling marginally better on Monday.  Ambulator sats at home have been noted to drop to 90% briefly with exertion,  but recover quickly to 96%.  She is no longer using oxygen during the day.  She is using  her albuterol nebulizer once daily, but using the albuterol MDI during the day and evening (prefers the MDI bc she  doesn't think she is using it right ) as well as the Flovent MDI 2 puffs twice daily.  Denies fevers,  headachea, nausea and diarrhea .  Still wheezing  ? ?Also having a problem with her oxygen concentrator leaking fluid.  She tried to call [employer] and was treated rather rudely by the customer service provider.  ?Chief Complaint  ?Patient presents with  ? Follow-up  ?  Follow up on cough  ? ? ? ? ?Outpatient Medications Prior to Visit  ?Medication Sig Dispense Refill  ? acetaminophen (TYLENOL) 500 MG tablet Take 500 mg by mouth 2 (two) times daily as needed for moderate pain or headache.    ? albuterol (PROVENTIL) (2.5 MG/3ML) 0.083% nebulizer solution INHALE THE CONTENTS OF 1 VIAL VIA NEBULIZER EVERY 6 HOURS AS NEEDED FOR WHEEZING  OR FOR SHORTNESS OF BREATH 180 mL 1  ? albuterol (VENTOLIN HFA) 108 (90 Base) MCG/ACT inhaler INHALE 2  PUFFS INTO THE LUNGS EVERY 6 HOURS AS NEEDED FOR WHEEZING OR SHORTNESS OF BREATH (USE WITH SPACER) 1 each 1  ? aspirin EC 81 MG EC tablet Take 1 tablet (81 mg total) by mouth daily.    ? benzonatate (TESSALON) 200 MG capsule Take 1 capsule (200 mg total) by mouth 3 (three) times daily as needed for cough. 20 capsule 0  ? calcitRIOL (ROCALTROL) 0.25 MCG capsule Take 1 capsule (0.25 mcg total) by mouth daily. 90 capsule 1  ? Calcium Carb-Cholecalciferol 600-800 MG-UNIT TABS Take 1 tablet by mouth in the morning, at noon, and at bedtime.     ? docusate sodium (COLACE) 100 MG capsule Take 100 mg by mouth daily.    ? famotidine (PEPCID AC) 10 MG chewable tablet Chew 20 mg by mouth daily as needed for heartburn.     ? ferrous sulfate 325 (65 FE) MG tablet Take 325 mg by mouth daily with breakfast.    ? FLOVENT HFA 110 MCG/ACT inhaler USE 2 INHALATIONS BY MOUTH  TWICE DAILY 36 g 3  ? furosemide (LASIX) 20 MG tablet TAKE 1 TABLET DAILY, MAY  TAKE AN ADDITIONAL TAB AS  NEEDED FOR WEIGHT GAIN OF 2 LBS OVERNIGHT OR 5 LBS IN 1 WEEK 90 tablet 3  ? levothyroxine (SYNTHROID) 150 MCG tablet TAKE 1 TABLET BY MOUTH  DAILY BEFORE BREAKFAST 90 tablet 3  ?  lidocaine (LIDODERM) 5 % Place 1 patch onto the skin every 12 (twelve) hours. Remove & Discard patch within 12 hours or as directed by MD 15 patch 0  ? loratadine (CLARITIN) 10 MG tablet Take 10 mg by mouth daily.    ? losartan (COZAAR) 50 MG tablet TAKE 1 TABLET BY MOUTH  DAILY 90 tablet 3  ? OXYGEN Inhale 2 L into the lungs at bedtime as needed (at bedtime and as needed).    ? oxymetazoline (AFRIN) 0.05 % nasal spray Place 1 spray into both nostrils 2 (two) times daily. As needed for nose bleed    ? polyethylene glycol powder (GLYCOLAX/MIRALAX) powder Take 17 g by mouth 2 (two) times daily as needed. 3350 g 1  ? Tiotropium Bromide-Olodaterol (STIOLTO RESPIMAT) 2.5-2.5 MCG/ACT AERS INHALE 2 INHALATIONS BY  MOUTH INTO THE LUNGS DAILY 12 g 3  ? traMADol (ULTRAM) 50 MG tablet Take 1-2  tablets (50-100 mg total) by mouth every 4 (four) hours as needed for moderate pain. 30 tablet 0  ? vitamin B-12 (CYANOCOBALAMIN) 1000 MCG tablet Take 1 tablet (1,000 mcg total) by mouth daily. 90 tablet 3  ? Vitamin D, Ergocalciferol, (DRISDOL) 1.25 MG (50000 UNIT) CAPS capsule Take 1 capsule (50,000 Units total) by mouth once a week. 12 capsule 0  ? doxycycline (VIBRA-TABS) 100 MG tablet Take 1 tablet (100 mg total) by mouth 2 (two) times daily. Take with food (Patient not taking: Reported on 07/18/2021) 14 tablet 0  ? ?No facility-administered medications prior to visit.  ? ? ?Review of Systems; ? ?Patient denies headache, fevers, malaise, unintentional weight loss, skin rash, eye pain, sinus congestion and sinus pain, sore throat, dysphagia,  hemoptysis ,  chest pain, palpitations, orthopnea, edema, abdominal pain, nausea, melena, diarrhea, constipation, flank pain, dysuria, hematuria, urinary  Frequency, nocturia, numbness, tingling, seizures,  Focal weakness, Loss of consciousness,  Tremor, insomnia, depression, anxiety, and suicidal ideation.   ? ? ? ?Objective:  ?BP 136/86 (BP Location: Left Arm, Patient Position: Sitting, Cuff Size: Large)   Pulse 92   Temp 97.6 ?F (36.4 ?C) (Oral)   Ht '5\' 5"'$  (1.651 m)   Wt 249 lb 6.4 oz (113.1 kg)   SpO2 97%   BMI 41.50 kg/m?  ? ?BP Readings from Last 3 Encounters:  ?07/18/21 136/86  ?07/11/21 128/84  ?06/18/21 139/72  ? ? ?Wt Readings from Last 3 Encounters:  ?07/18/21 249 lb 6.4 oz (113.1 kg)  ?07/11/21 249 lb 6.4 oz (113.1 kg)  ?06/18/21 253 lb (114.8 kg)  ? ? ?General appearance: alert, cooperative and appears stated age ?Ears: normal TM's and external ear canals both ears ?Throat: lips, mucosa, and tongue normal; teeth and gums normal ?Neck: no adenopathy, no carotid bruit, supple, symmetrical, trachea midline and thyroid not enlarged, symmetric, no tenderness/mass/nodules ?Back: symmetric, no curvature. ROM normal. No CVA tenderness. ?Lungs:  bilateral wheezing  ( resolved after albuterol neb done in house)  ?Heart: regular rate and rhythm, S1, S2 normal, no murmur, click, rub or gallop ?Abdomen: soft, non-tender; bowel sounds normal; no masses,  no organomegaly ?Pulses: 2+ and symmetric ?Skin: Skin color, texture, turgor normal. No rashes or lesions ?Lymph nodes: Cervical, supraclavicular, and axillary nodes normal. ? ?Lab Results  ?Component Value Date  ? HGBA1C 5.6 12/20/2020  ? HGBA1C 5.5 01/26/2019  ? HGBA1C 5.3 07/30/2017  ? ? ?Lab Results  ?Component Value Date  ? CREATININE 0.88 06/18/2021  ? CREATININE 0.97 05/27/2021  ? CREATININE 0.91 04/29/2021  ? ? ?Lab  Results  ?Component Value Date  ? WBC 6.3 07/11/2021  ? HGB 14.7 07/11/2021  ? HCT 44.3 07/11/2021  ? PLT 188.0 07/11/2021  ? GLUCOSE 100 (H) 06/18/2021  ? CHOL 192 04/29/2021  ? TRIG 92.0 04/29/2021  ? HDL 59.90 04/29/2021  ? LDLDIRECT 101.0 07/30/2017  ? LDLCALC 114 (H) 04/29/2021  ? ALT 19 06/18/2021  ? AST 21 06/18/2021  ? NA 139 06/18/2021  ? K 3.8 06/18/2021  ? CL 99 06/18/2021  ? CREATININE 0.88 06/18/2021  ? BUN 13 06/18/2021  ? CO2 30 06/18/2021  ? TSH 0.65 04/29/2021  ? INR 1.1 (H) 06/13/2020  ? HGBA1C 5.6 12/20/2020  ? ? ?CT Renal Stone Study ? ?Result Date: 06/18/2021 ?CLINICAL DATA:  Flank pain, suspected kidney stone in a 66 year old female. EXAM: CT ABDOMEN AND PELVIS WITHOUT CONTRAST TECHNIQUE: Multidetector CT imaging of the abdomen and pelvis was performed following the standard protocol without IV contrast. RADIATION DOSE REDUCTION: This exam was performed according to the departmental dose-optimization program which includes automated exposure control, adjustment of the mA and/or kV according to patient size and/or use of iterative reconstruction technique. COMPARISON:  None FINDINGS: Lower chest: Basilar atelectasis with elevation of the RIGHT hemidiaphragm that is been present on multiple prior studies. No effusion. No consolidative changes. Signs of mitral valve replacement. Cardiomegaly  similar to prior imaging. No pericardial effusion. Cardiac structures are incompletely imaged. Hepatobiliary: Smooth mildly lobular hepatic contours. No pericholecystic stranding. No biliary duct dilation gro

## 2021-07-20 ENCOUNTER — Encounter: Payer: Self-pay | Admitting: Internal Medicine

## 2021-07-20 MED ORDER — ALBUTEROL SULFATE (2.5 MG/3ML) 0.083% IN NEBU: 2.5000 mg | INHALATION_SOLUTION | Freq: Once | RESPIRATORY_TRACT | Status: DC

## 2021-07-20 NOTE — Assessment & Plan Note (Signed)
Gradually resolving after first IM dose of depo medrol, abx,  ICS  and bronchodilators,  Chest x ray without infiltrates.  Repeatin the steroid dose today  ?

## 2021-08-05 DIAGNOSIS — J449 Chronic obstructive pulmonary disease, unspecified: Secondary | ICD-10-CM | POA: Diagnosis not present

## 2021-08-20 ENCOUNTER — Other Ambulatory Visit: Payer: Self-pay | Admitting: Internal Medicine

## 2021-08-20 DIAGNOSIS — J449 Chronic obstructive pulmonary disease, unspecified: Secondary | ICD-10-CM

## 2021-08-28 ENCOUNTER — Encounter: Payer: Self-pay | Admitting: Internal Medicine

## 2021-08-28 ENCOUNTER — Ambulatory Visit (INDEPENDENT_AMBULATORY_CARE_PROVIDER_SITE_OTHER): Payer: Medicare Other | Admitting: Internal Medicine

## 2021-08-28 VITALS — BP 130/82 | HR 107 | Temp 97.4°F | Ht 65.0 in | Wt 248.0 lb

## 2021-08-28 DIAGNOSIS — E034 Atrophy of thyroid (acquired): Secondary | ICD-10-CM | POA: Diagnosis not present

## 2021-08-28 DIAGNOSIS — J440 Chronic obstructive pulmonary disease with acute lower respiratory infection: Secondary | ICD-10-CM | POA: Diagnosis not present

## 2021-08-28 DIAGNOSIS — J42 Unspecified chronic bronchitis: Secondary | ICD-10-CM | POA: Diagnosis not present

## 2021-08-28 DIAGNOSIS — J209 Acute bronchitis, unspecified: Secondary | ICD-10-CM | POA: Diagnosis not present

## 2021-08-28 DIAGNOSIS — I5032 Chronic diastolic (congestive) heart failure: Secondary | ICD-10-CM

## 2021-08-28 DIAGNOSIS — M8580 Other specified disorders of bone density and structure, unspecified site: Secondary | ICD-10-CM | POA: Diagnosis not present

## 2021-08-28 MED ORDER — TRELEGY ELLIPTA 100-62.5-25 MCG/ACT IN AEPB
1.0000 | INHALATION_SPRAY | Freq: Every day | RESPIRATORY_TRACT | 11 refills | Status: DC
Start: 2021-08-28 — End: 2021-08-28

## 2021-08-28 MED ORDER — TRELEGY ELLIPTA 100-62.5-25 MCG/ACT IN AEPB: 1.0000 | INHALATION_SPRAY | Freq: Every day | RESPIRATORY_TRACT | 3 refills | Status: DC

## 2021-08-28 NOTE — Assessment & Plan Note (Signed)
Thyroid function is WNL on current dose.  No current changes needed.   Lab Results  Component Value Date   TSH 0.65 04/29/2021

## 2021-08-28 NOTE — Assessment & Plan Note (Signed)
encouraged to start walking daily ,  Which she has resumed

## 2021-08-28 NOTE — Assessment & Plan Note (Signed)
Secondary  To parathyroid deficiency and/or recurrent steroid use  DEX ordered

## 2021-08-28 NOTE — Assessment & Plan Note (Signed)
Moderate to severe, with bronchospasm   She has been advised to continue current regimen and  follow up with pulmonary as advised.

## 2021-08-28 NOTE — Patient Instructions (Addendum)
Please pick up the new inhaler called Trelegy   This replaces Stiolto and Flovent   Use once daily    Use your albuterol before you go for a walk   Please make an appt with Dr Patsey Berthold as soon as possible.    I have ordered a bone density  test  called a DEXA   scan.   Please call to make your appointment at [place] [phone]

## 2021-08-28 NOTE — Assessment & Plan Note (Signed)
Most recent [facility] reviewed Feb 2020.  EF 55 to 60%  NVH noted ,  Pulmonary hypertension noted.

## 2021-08-28 NOTE — Progress Notes (Signed)
Subjective:  Patient ID: Lauren Bartlett, female    DOB: 1955-11-08  Age: 66 y.o. MRN: [id number]  CC: The primary encounter diagnosis was Osteopenia determined by x-ray. Diagnoses of Hypocalcemia, Chronic bronchitis, unspecified chronic bronchitis type ([place]), Acute bronchitis with COPD ([place]), Morbid obesity ([place]), Chronic diastolic congestive heart failure ([place]), and Hypothyroidism due to acquired atrophy of thyroid were also pertinent to this visit.   HPI Lauren Bartlett presents for follow up on multiple issues  Chief Complaint  Patient presents with   Follow-up    Follow up on hypertension, hypothyroidism   Lauren Bartlett is a 66 yr old female with moderate to severe COPD with bronchospasm by 2018 PFTS, pulmonary hypertension,  morbid obesity , and hypocalcemia who presents for follow up.  1) COPD exacerbation: treated April 13 with I/m steroids and doxycycline,  still wheezing at follow up on April 20 ..  chest x ray was negative for infiltrates. Steroid dose repeated  Still wheezing but less often.  Denies fevers, chest pain, dyspnea.  Has resume walking program to work on weight loss .  2) HTN:  Patient is taking her medications as prescribed and notes no adverse effects.  Home BP readings have been done about once per week and are  generally < 130/80 .  She is avoiding added salt in her diet and walking regularly about 3 times per week for exercise  .   3) Hypothyroidism: taking levothyroxine   Outpatient Medications Prior to Visit  Medication Sig Dispense Refill   acetaminophen (TYLENOL) 500 MG tablet Take 500 mg by mouth 2 (two) times daily as needed for moderate pain or headache.     albuterol (PROVENTIL) (2.5 MG/3ML) 0.083% nebulizer solution INHALE THE CONTENTS OF 1 VIAL VIA NEBULIZER EVERY 6 HOURS AS NEEDED FOR WHEEZING  OR FOR SHORTNESS OF BREATH 180 mL 1   albuterol (VENTOLIN HFA) 108 (90 Base) MCG/ACT inhaler INHALE 2 PUFFS INTO THE LUNGS EVERY 6 HOURS AS NEEDED FOR WHEEZING OR  SHORTNESS OF BREATH (USE WITH SPACER) 1 each 1   aspirin EC 81 MG EC tablet Take 1 tablet (81 mg total) by mouth daily.     benzonatate (TESSALON) 200 MG capsule Take 1 capsule (200 mg total) by mouth 3 (three) times daily as needed for cough. 20 capsule 0   calcitRIOL (ROCALTROL) 0.25 MCG capsule Take 1 capsule (0.25 mcg total) by mouth daily. 90 capsule 1   Calcium Carb-Cholecalciferol 600-800 MG-UNIT TABS Take 1 tablet by mouth in the morning, at noon, and at bedtime.      docusate sodium (COLACE) 100 MG capsule Take 100 mg by mouth daily.     famotidine (PEPCID AC) 10 MG chewable tablet Chew 20 mg by mouth daily as needed for heartburn.      ferrous sulfate 325 (65 FE) MG tablet Take 325 mg by mouth daily with breakfast.     furosemide (LASIX) 20 MG tablet TAKE 1 TABLET BY MOUTH DAILY MAY TAKE AN ADDITIONAL TABLET AS  NEEDED FOR WEIGHT GAIN OF 2 LBS  OVERNIGHT OR 5 LBS IN 1 WEEK 180 tablet 3   levothyroxine (SYNTHROID) 150 MCG tablet TAKE 1 TABLET BY MOUTH  DAILY BEFORE BREAKFAST 90 tablet 3   lidocaine (LIDODERM) 5 % Place 1 patch onto the skin every 12 (twelve) hours. Remove & Discard patch within 12 hours or as directed by MD 15 patch 0   loratadine (CLARITIN) 10 MG tablet Take 10 mg by mouth daily.  losartan (COZAAR) 50 MG tablet TAKE 1 TABLET BY MOUTH  DAILY 90 tablet 3   OXYGEN Inhale 2 L into the lungs at bedtime as needed (at bedtime and as needed).     oxymetazoline (AFRIN) 0.05 % nasal spray Place 1 spray into both nostrils 2 (two) times daily. As needed for nose bleed     polyethylene glycol powder (GLYCOLAX/MIRALAX) powder Take 17 g by mouth 2 (two) times daily as needed. 3350 g 1   traMADol (ULTRAM) 50 MG tablet Take 1-2 tablets (50-100 mg total) by mouth every 4 (four) hours as needed for moderate pain. 30 tablet 0   vitamin B-12 (CYANOCOBALAMIN) 1000 MCG tablet Take 1 tablet (1,000 mcg total) by mouth daily. 90 tablet 3   Vitamin D, Ergocalciferol, (DRISDOL) 1.25 MG (50000  UNIT) CAPS capsule TAKE 1 CAPSULE BY MOUTH 1 TIME A WEEK 12 capsule 0   FLOVENT HFA 110 MCG/ACT inhaler USE 2 INHALATIONS BY MOUTH  TWICE DAILY 36 g 3   Tiotropium Bromide-Olodaterol (STIOLTO RESPIMAT) 2.5-2.5 MCG/ACT AERS INHALE 2 INHALATIONS BY  MOUTH INTO THE LUNGS DAILY 12 g 3   Facility-Administered Medications Prior to Visit  Medication Dose Route Frequency Provider Last Rate Last Admin   albuterol (PROVENTIL) (2.5 MG/3ML) 0.083% nebulizer solution 2.5 mg  2.5 mg Nebulization Once [name], MD        Review of Systems;  Patient denies headache, fevers, malaise, unintentional weight loss, skin rash, eye pain, sinus congestion and sinus pain, sore throat, dysphagia,  hemoptysis ,  chest pain, palpitations, orthopnea, edema, abdominal pain, nausea, melena, diarrhea, constipation, flank pain, dysuria, hematuria, urinary  Frequency, nocturia, numbness, tingling, seizures,  Focal weakness, Loss of consciousness,  Tremor, insomnia, depression, anxiety, and suicidal ideation.      Objective:  BP 130/82 (BP Location: Left Arm, Patient Position: Sitting, Cuff Size: Large)   Pulse (!) 107   Temp (!) 97.4 F (36.3 C) (Oral)   Ht '5\' 5"'$  (1.651 m)   Wt 248 lb (112.5 kg)   SpO2 95%   BMI 41.27 kg/m   BP Readings from Last 3 Encounters:  08/28/21 130/82  07/18/21 136/86  07/11/21 128/84    Wt Readings from Last 3 Encounters:  08/28/21 248 lb (112.5 kg)  07/18/21 249 lb 6.4 oz (113.1 kg)  07/11/21 249 lb 6.4 oz (113.1 kg)    General appearance: alert, cooperative and appears stated age.  Wearing supplemental oxygen  Ears: normal TM's and external ear canals both ears Throat: lips, mucosa, and tongue normal; teeth and gums normal Neck: no adenopathy, no carotid bruit, supple, symmetrical, trachea midline and thyroid not enlarged, symmetric, no tenderness/mass/nodules Back: symmetric, no curvature. ROM normal. No CVA tenderness. Lungs: clear to auscultation bilaterally with  occasional scattered intermittent wheezies.  Heart: regular rate and rhythm, S1, S2 normal, no murmur, click, rub or gallop Abdomen: soft, non-tender; bowel sounds normal; no masses,  no organomegaly Pulses: 2+ and symmetric Skin: Skin color, texture, turgor normal. No rashes or lesions Lymph nodes: Cervical, supraclavicular, and axillary nodes normal.  Lab Results  Component Value Date   HGBA1C 5.6 12/20/2020   HGBA1C 5.5 01/26/2019   HGBA1C 5.3 07/30/2017    Lab Results  Component Value Date   CREATININE 0.88 06/18/2021   CREATININE 0.97 05/27/2021   CREATININE 0.91 04/29/2021    Lab Results  Component Value Date   WBC 6.3 07/11/2021   HGB 14.7 07/11/2021   HCT 44.3 07/11/2021   PLT 188.0 07/11/2021  GLUCOSE 100 (H) 06/18/2021   CHOL 192 04/29/2021   TRIG 92.0 04/29/2021   HDL 59.90 04/29/2021   LDLDIRECT 101.0 07/30/2017   LDLCALC 114 (H) 04/29/2021   ALT 19 06/18/2021   AST 21 06/18/2021   NA 139 06/18/2021   K 3.8 06/18/2021   CL 99 06/18/2021   CREATININE 0.88 06/18/2021   BUN 13 06/18/2021   CO2 30 06/18/2021   TSH 0.65 04/29/2021   INR 1.1 (H) 06/13/2020   HGBA1C 5.6 12/20/2020    CT Renal Stone Study  Result Date: 06/18/2021 CLINICAL DATA:  Flank pain, suspected kidney stone in a 66 year old female. EXAM: CT ABDOMEN AND PELVIS WITHOUT CONTRAST TECHNIQUE: Multidetector CT imaging of the abdomen and pelvis was performed following the standard protocol without IV contrast. RADIATION DOSE REDUCTION: This exam was performed according to the departmental dose-optimization program which includes automated exposure control, adjustment of the mA and/or kV according to patient size and/or use of iterative reconstruction technique. COMPARISON:  None FINDINGS: Lower chest: Basilar atelectasis with elevation of the RIGHT hemidiaphragm that is been present on multiple prior studies. No effusion. No consolidative changes. Signs of mitral valve replacement. Cardiomegaly  similar to prior imaging. No pericardial effusion. Cardiac structures are incompletely imaged. Hepatobiliary: Smooth mildly lobular hepatic contours. No pericholecystic stranding. No biliary duct dilation grossly on noncontrast imaging. Pancreas: Normal contour without signs of inflammation. Spleen: Normal. Adrenals/Urinary Tract: Adrenal glands are normal. Smooth contour of the RIGHT kidney.  LEFT kidney is absent. Urinary bladder without signs of adjacent stranding. No hydronephrosis or nephrolithiasis. Stomach/Bowel: Stomach under distended no adjacent stranding. No sign of small bowel obstruction. Appendix not visualized, no secondary signs to suggest appendicitis. Sigmoid diverticular disease. Query mild increased vascularity along the sigmoid colon without frank stranding, no signs of abscess. Colon otherwise unremarkable. Vascular/Lymphatic: Aortic atherosclerosis. No sign of aneurysm. Smooth contour of the IVC. There is no gastrohepatic or hepatoduodenal ligament lymphadenopathy. No retroperitoneal or mesenteric lymphadenopathy. No pelvic sidewall lymphadenopathy. Limited assessment of vascular structures in the absence of intravenous contrast. Reproductive: Unremarkable by CT.  No adnexal masses. Other: No ascites.  No pneumoperitoneum. Musculoskeletal: Spinal degenerative changes. No acute or destructive bone process. Degenerative changes of bilateral hips. Levo convexity of the lumbar spine with mild rotary component in the setting of degenerative change. IMPRESSION: 1. Sigmoid diverticular disease with question of mild inflammation, could represent low level diverticulitis in the appropriate clinical setting, though without substantial surrounding stranding or signs of complication at this time. 2. Solitary RIGHT kidney.  No hydronephrosis or nephrolithiasis. 3. Cardiomegaly, signs of valvular replacement and aortic atherosclerosis. Aortic Atherosclerosis (ICD10-I70.0). Electronically Signed   By:  Zetta Bills M.D.   On: 06/18/2021 11:11    Assessment & Plan:   Problem List Items Addressed This Visit     Chronic diastolic congestive heart failure ([place]) (Chronic)    Most recent [facility] reviewed Feb 2020.  EF 55 to 60%  NVH noted ,  Pulmonary hypertension noted.        COPD (chronic obstructive pulmonary disease) (HCC) (Chronic)    Moderate to severe, with bronchospasm   She has been advised to continue current regimen and  follow up with pulmonary as advised.        Relevant Medications   Fluticasone-Umeclidin-Vilant (TRELEGY ELLIPTA) 100-62.5-25 MCG/ACT AEPB   Hypothyroidism (Chronic)    Thyroid function is WNL on current dose.  No current changes needed.   Lab Results  Component Value Date   TSH 0.65 04/29/2021  Acute bronchitis with COPD ([place])    Current epispde has been slow to resolve.  Chest x ray negative for infiltrates.  Changing inhaled therapy from Flovent/Stiolto to o Trelegy Ellipta . Follow up with Pulmonology        Relevant Medications   Fluticasone-Umeclidin-Vilant (TRELEGY ELLIPTA) 100-62.5-25 MCG/ACT AEPB   Hypocalcemia    Presumed by endocrinology eval to be due to mild secondary hypoparathyroidism.  Continue calcium supplementation and calcitriol, dose adjust as needed  Lab Results  Component Value Date   CALCIUM 8.6 (L) 06/18/2021   PHOS 4.2 06/11/2019           Relevant Orders   Calcium, ionized   Morbid obesity ([place])    encouraged to start walking daily ,  Which she has resumed        Osteopenia determined by x-ray - Primary    Secondary  To parathyroid deficiency and/or recurrent steroid use  DEX ordered        Relevant Orders   DG Bone Density    I spent a total of   43  minutes with this patient in a face to face visit on the date of this encounter reviewing the last office visit with me in May,  her  most recent   ECHOs/p mitral valve replacement,  patient'ss diet and eating habits, home blood pressure readings ,   most recent imaging study ,   and post visit ordering of testing and therapeutics.    Follow-up: No follow-ups on file.   [name], MD

## 2021-08-28 NOTE — Assessment & Plan Note (Addendum)
Presumed by endocrinology eval to be due to mild secondary hypoparathyroidism.  Continue calcium supplementation and calcitriol, dose adjust as needed  Lab Results  Component Value Date   CALCIUM 8.6 (L) 06/18/2021   PHOS 4.2 06/11/2019

## 2021-08-28 NOTE — Assessment & Plan Note (Signed)
Current epispde has been slow to resolve.  Chest x ray negative for infiltrates.  Changing inhaled therapy from Flovent/Stiolto to o Trelegy Ellipta . Follow up with Pulmonology

## 2021-08-29 LAB — CALCIUM, IONIZED: Calcium, Ion: 4.7 mg/dL (ref 4.7–5.5)

## 2021-09-05 DIAGNOSIS — J449 Chronic obstructive pulmonary disease, unspecified: Secondary | ICD-10-CM | POA: Diagnosis not present

## 2021-09-13 ENCOUNTER — Other Ambulatory Visit: Payer: Self-pay | Admitting: Internal Medicine

## 2021-09-25 ENCOUNTER — Other Ambulatory Visit: Payer: Medicare Other

## 2021-09-27 DIAGNOSIS — E559 Vitamin D deficiency, unspecified: Secondary | ICD-10-CM | POA: Diagnosis not present

## 2021-10-05 DIAGNOSIS — J449 Chronic obstructive pulmonary disease, unspecified: Secondary | ICD-10-CM | POA: Diagnosis not present

## 2021-10-14 ENCOUNTER — Other Ambulatory Visit: Payer: Medicare Other

## 2021-11-05 DIAGNOSIS — J449 Chronic obstructive pulmonary disease, unspecified: Secondary | ICD-10-CM | POA: Diagnosis not present

## 2021-11-06 ENCOUNTER — Ambulatory Visit
Admission: RE | Admit: 2021-11-06 | Discharge: 2021-11-06 | Disposition: A | Discharge status: Home / Self Care | Payer: Medicare Other | Source: Ambulatory Visit | Attending: Internal Medicine | Admitting: Internal Medicine

## 2021-11-06 DIAGNOSIS — Z13820 Encounter for screening for osteoporosis: Secondary | ICD-10-CM | POA: Insufficient documentation

## 2021-11-06 DIAGNOSIS — E039 Hypothyroidism, unspecified: Secondary | ICD-10-CM | POA: Insufficient documentation

## 2021-11-06 DIAGNOSIS — Z780 Asymptomatic menopausal state: Secondary | ICD-10-CM | POA: Insufficient documentation

## 2021-11-06 DIAGNOSIS — M8580 Other specified disorders of bone density and structure, unspecified site: Secondary | ICD-10-CM | POA: Insufficient documentation

## 2021-11-06 DIAGNOSIS — J449 Chronic obstructive pulmonary disease, unspecified: Secondary | ICD-10-CM | POA: Diagnosis not present

## 2021-11-13 ENCOUNTER — Other Ambulatory Visit: Payer: Self-pay | Admitting: Internal Medicine

## 2021-11-15 DIAGNOSIS — H04123 Dry eye syndrome of bilateral lacrimal glands: Secondary | ICD-10-CM | POA: Diagnosis not present

## 2021-11-21 ENCOUNTER — Other Ambulatory Visit: Payer: Self-pay

## 2021-11-21 MED ORDER — VITAMIN D (ERGOCALCIFEROL) 1.25 MG (50000 UNIT) PO CAPS: ORAL_CAPSULE | ORAL | 3 refills | Status: DC

## 2021-11-21 NOTE — Telephone Encounter (Signed)
Refilled: 08/20/2021 Last OV: 08/28/2021 Next OV: 01/23/2022 Last Vitamin D level: 04/29/2021 and it was normal

## 2021-11-25 ENCOUNTER — Ambulatory Visit (INDEPENDENT_AMBULATORY_CARE_PROVIDER_SITE_OTHER): Payer: Medicare Other | Admitting: Pulmonary Disease

## 2021-11-25 ENCOUNTER — Telehealth: Payer: Self-pay | Admitting: Pulmonary Disease

## 2021-11-25 ENCOUNTER — Encounter: Payer: Self-pay | Admitting: Pulmonary Disease

## 2021-11-25 VITALS — BP 134/78 | HR 94 | Temp 98.2°F | Ht 65.0 in | Wt 246.0 lb

## 2021-11-25 DIAGNOSIS — G4736 Sleep related hypoventilation in conditions classified elsewhere: Secondary | ICD-10-CM | POA: Diagnosis not present

## 2021-11-25 DIAGNOSIS — J449 Chronic obstructive pulmonary disease, unspecified: Secondary | ICD-10-CM

## 2021-11-25 DIAGNOSIS — J440 Chronic obstructive pulmonary disease with acute lower respiratory infection: Secondary | ICD-10-CM

## 2021-11-25 DIAGNOSIS — J209 Acute bronchitis, unspecified: Secondary | ICD-10-CM

## 2021-11-25 DIAGNOSIS — J4541 Moderate persistent asthma with (acute) exacerbation: Secondary | ICD-10-CM

## 2021-11-25 MED ORDER — METHYLPREDNISOLONE ACETATE 80 MG/ML IJ SUSP
80.0000 mg | Freq: Once | INTRAMUSCULAR | Status: AC
  Administered 2021-11-25: 80 mg via INTRAMUSCULAR

## 2021-11-25 MED ORDER — DOXYCYCLINE HYCLATE 100 MG PO TABS: 100.0000 mg | ORAL_TABLET | Freq: Two times a day (BID) | ORAL | 0 refills | Status: AC

## 2021-11-25 NOTE — Patient Instructions (Addendum)
We are giving you a shot of Medrol.  Please check a COVID test when you get home and let us know the results.  I have sent a prescription of doxycycline to your pharmacy.  This is 1 tablet/capsule twice a day.  This is an antibiotic.  We will see you in follow-up in 3 months time call sooner should any new problems arise.

## 2021-11-25 NOTE — Telephone Encounter (Signed)
Excellent

## 2021-11-25 NOTE — Telephone Encounter (Signed)
Spoke to patient.  Patient wanted to make Dr. Patsey Berthold aware that she tested negative for covid. Nothing further needed,  Routing to LG as an [place]

## 2021-11-25 NOTE — Progress Notes (Unsigned)
Subjective:    Patient ID: Lauren Bartlett, female    DOB: 01/17/1956, 66 y.o.   MRN: [id number] Patient Care Team: [name], MD as PCP - General (Internal Medicine) Wellington Hampshire, MD as PCP - Cardiology (Cardiology)  Chief Complaint  Patient presents with   Follow-up    C/o chest congestion, wheezing, prod cough with white to green sputum and occ SOB with exertion.   Requesting MD/Service: Derrel Nip          Date of initial consultation: 03/07/16 by Dr. Alva Garnet         Reason for consultation: tracheobronchomalacia, DOE   PT PROFILE: 66 y.o. F never smoker with significant lifetime exposure to second hand smoke with multiple medical problems including chronic hypoxic respiratory failure, moderate COPD, mitral valve prolapse and regurgitation, pulm htn by echocardiogram, appearance of tracheomalacia on CT chest previously followed by Dr Raul Del and referred for further evaluation of severe exertional limitation. Initial eval suggested severe mitral regurgitation and impression was that this was a major contributor to her severe DOE   DATA: Echocardiogram 02/01/13: Normal LVEF. MVP with moderate MR, Moderate AI, severe TI. RVSP est 91 mmHg CT chest 07/29/15: Cardiomegaly with left atrial dilatation. Tracheobronchomalacia. Air trapping in the lungs bilaterally, indicative of small airways disease Echocardiogram 06/13/14: Normal LVEF, Severe MR, RVSP est 62 mmHg Spirometry 03/12/15: FVC 1.94 L (73% pred). FEV1 1.19 L (55% pred), FEV1/FVC 61% 6MWT 03/12/15: 225 meters, no desaturation LE venous US 07/29/15: No DVT Echocardiogram 07/31/15: LVEF 60-65%. MV poorly visualized, trivial MR, LA size normal, TV poorly visualized, RVSP not estimated CT chest 03/11/16: Stable changes of tracheobronchomalacia Echocardiogram 04/15/16: Normal LVEF, Severe MR, moderate PAH Mitral valvuloplasty 07/17/16: @ [place] by Dr Roxy Manns Echocardiogram 09/15/16: LVEF 60-65%. RVSP estimate 54 mmHg ONO 10/21/16: on  RA. 45 mins < 89%, lowest SpO2 84% PFTs 03/26/17: FVC: 1.73 > 1.74 L (63 %pred), FEV1: 1.01 > 1.16 L (45> 52%pred, 15% improvement after bronchodilator), FEV1/FVC: 59 >67%, TLC: 3.27 L (73 %pred), DLCO 46 %pred, DLCO/VA 103%     INTERVAL:  Last seen  05/2019 by Eric Form NP.  At that time she had been admitted to [facility] for COPD exacerbation between 11 March through 13 March. No major pulmonary problems since her last visit. HPI    Review of Systems A 10 point review of systems was performed and it is as noted above otherwise negative.  Patient Active Problem List   Diagnosis Date Noted   Osteopenia determined by x-ray 08/28/2021   Acute bronchitis with COPD ([place]) 07/11/2021   Unsteady gait when walking 04/29/2021   Aortic atherosclerosis ([place]) 09/16/2020   Recurrent epistaxis 06/16/2020   PAF (paroxysmal atrial fibrillation) ([place]) 12/18/2018   Nocturnal hypoxemia due to obstructive chronic bronchitis ([place]) 02/06/2018   Encounter for therapeutic drug monitoring 08/13/2016   S/P minimally invasive mitral valve repair 07/17/2016   S/P patent foramen ovale closure 07/17/2016   Chronic diastolic congestive heart failure (HCC)    COPD (chronic obstructive pulmonary disease) ([place]) 05/19/2016   Vitamin D deficiency 01/11/2015   Allergic rhinitis 07/09/2014   S/P hysterectomy with oophorectomy 09/22/2013   Pulmonary hypertension ([place])    Hypocalcemia 09/10/2012   Hypothyroidism 05/04/2012   Morbid obesity ([place]) 08/04/2011   Hypertension    Congenital absence of one kidney    History of sciatica    Social History   Tobacco Use   Smoking status: Never   Smokeless tobacco: Never  Substance Use Topics  Alcohol use: No   Allergies  Allergen Reactions   Codeine Hives and Nausea And Vomiting   Levaquin [Levofloxacin] Nausea And Vomiting   Penicillins Hives, Itching and Other (See Comments)    Has patient had a PCN reaction causing immediate rash, facial/tongue/throat swelling, SOB  or lightheadedness with hypotension: No Has patient had a PCN reaction causing severe rash involving mucus membranes or skin necrosis: No Has patient had a PCN reaction that required hospitalization No Has patient had a PCN reaction occurring within the last 10 years: No If all of the above answers are "NO", then may proceed with Cephalosporin use.   Zithromax [Azithromycin] Other (See Comments)    Reaction:  Hallucinations    Prednisone Palpitations and Other (See Comments)    Reaction:  Hallucinations  Can take injections but not oral meds   Current Meds  Medication Sig   acetaminophen (TYLENOL) 500 MG tablet Take 500 mg by mouth 2 (two) times daily as needed for moderate pain or headache.   albuterol (PROVENTIL) (2.5 MG/3ML) 0.083% nebulizer solution INHALE THE CONTENTS OF 1 VIAL VIA NEBULIZER EVERY 6 HOURS AS NEEDED FOR WHEEZING  OR FOR SHORTNESS OF BREATH   albuterol (VENTOLIN HFA) 108 (90 Base) MCG/ACT inhaler INHALE 2 INHALATIONS BY  MOUTH INTO THE LUNGS EVERY  6 HOURS AS NEEDED FOR  WHEEZING OR SHORTNESS OF  BREATH (USE WITH SPACER)   aspirin EC 81 MG EC tablet Take 1 tablet (81 mg total) by mouth daily.   benzonatate (TESSALON) 200 MG capsule Take 1 capsule (200 mg total) by mouth 3 (three) times daily as needed for cough.   calcitRIOL (ROCALTROL) 0.25 MCG capsule TAKE 1 CAPSULE BY MOUTH DAILY   Calcium Carb-Cholecalciferol 600-800 MG-UNIT TABS Take 1 tablet by mouth in the morning, at noon, and at bedtime.    docusate sodium (COLACE) 100 MG capsule Take 100 mg by mouth daily.   famotidine (PEPCID AC) 10 MG chewable tablet Chew 20 mg by mouth daily as needed for heartburn.    ferrous sulfate 325 (65 FE) MG tablet Take 325 mg by mouth daily with breakfast.   Fluticasone-Umeclidin-Vilant (TRELEGY ELLIPTA) 100-62.5-25 MCG/ACT AEPB Inhale 1 puff into the lungs daily.   furosemide (LASIX) 20 MG tablet TAKE 1 TABLET BY MOUTH DAILY MAY TAKE AN ADDITIONAL TABLET AS  NEEDED FOR WEIGHT GAIN OF  2 LBS  OVERNIGHT OR 5 LBS IN 1 WEEK   levothyroxine (SYNTHROID) 150 MCG tablet TAKE 1 TABLET BY MOUTH  DAILY BEFORE BREAKFAST   lidocaine (LIDODERM) 5 % Place 1 patch onto the skin every 12 (twelve) hours. Remove & Discard patch within 12 hours or as directed by MD   loratadine (CLARITIN) 10 MG tablet Take 10 mg by mouth daily.   losartan (COZAAR) 50 MG tablet TAKE 1 TABLET BY MOUTH  DAILY   OXYGEN Inhale 2 L into the lungs at bedtime as needed (at bedtime and as needed).   oxymetazoline (AFRIN) 0.05 % nasal spray Place 1 spray into both nostrils 2 (two) times daily. As needed for nose bleed   polyethylene glycol powder (GLYCOLAX/MIRALAX) powder Take 17 g by mouth 2 (two) times daily as needed.   traMADol (ULTRAM) 50 MG tablet Take 1-2 tablets (50-100 mg total) by mouth every 4 (four) hours as needed for moderate pain.   vitamin B-12 (CYANOCOBALAMIN) 1000 MCG tablet Take 1 tablet (1,000 mcg total) by mouth daily.   Vitamin D, Ergocalciferol, (DRISDOL) 1.25 MG (50000 UNIT) CAPS capsule TAKE 1  CAPSULE BY MOUTH 1 TIME A WEEK   Immunization History  Administered Date(s) Administered   Fluad Quad(high Dose 65+) 12/20/2020   Influenza Split 01/02/2012   Influenza,inj,Quad PF,6+ Mos 03/13/2014, 01/08/2015, 02/03/2018, 01/26/2019, 02/02/2020   Influenza-Unspecified 01/11/2013, 12/30/2014   PFIZER(Purple Top)SARS-COV-2 Vaccination 08/12/2019, 09/07/2019   Pneumococcal Conjugate-13 02/27/2015   Pneumococcal Polysaccharide-23 03/07/2013, 04/13/2019   Tdap 11/12/2009        Objective:   Physical Exam BP 134/78 (BP Location: Left Arm, Cuff Size: Large)   Pulse 94   Temp 98.2 F (36.8 C) (Temporal)   Ht '5\' 5"'$  (1.651 m)   Wt 246 lb (111.6 kg)   SpO2 97%   BMI 40.94 kg/m         Assessment & Plan:

## 2021-11-27 ENCOUNTER — Encounter: Payer: Self-pay | Admitting: Pulmonary Disease

## 2021-11-28 ENCOUNTER — Emergency Department
Admission: EM | Admit: 2021-11-28 | Discharge: 2021-11-28 | Disposition: A | Discharge status: Home / Self Care | Payer: Medicare Other | Attending: Emergency Medicine | Admitting: Emergency Medicine

## 2021-11-28 ENCOUNTER — Emergency Department: Payer: Medicare Other

## 2021-11-28 ENCOUNTER — Other Ambulatory Visit: Payer: Self-pay

## 2021-11-28 DIAGNOSIS — R0602 Shortness of breath: Secondary | ICD-10-CM | POA: Diagnosis not present

## 2021-11-28 DIAGNOSIS — I110 Hypertensive heart disease with heart failure: Secondary | ICD-10-CM | POA: Diagnosis not present

## 2021-11-28 DIAGNOSIS — Z7951 Long term (current) use of inhaled steroids: Secondary | ICD-10-CM | POA: Diagnosis not present

## 2021-11-28 DIAGNOSIS — J9811 Atelectasis: Secondary | ICD-10-CM | POA: Diagnosis not present

## 2021-11-28 DIAGNOSIS — E039 Hypothyroidism, unspecified: Secondary | ICD-10-CM | POA: Diagnosis not present

## 2021-11-28 DIAGNOSIS — I5032 Chronic diastolic (congestive) heart failure: Secondary | ICD-10-CM | POA: Diagnosis not present

## 2021-11-28 DIAGNOSIS — Z743 Need for continuous supervision: Secondary | ICD-10-CM | POA: Diagnosis not present

## 2021-11-28 DIAGNOSIS — R6889 Other general symptoms and signs: Secondary | ICD-10-CM | POA: Diagnosis not present

## 2021-11-28 DIAGNOSIS — Q279 Congenital malformation of peripheral vascular system, unspecified: Secondary | ICD-10-CM | POA: Diagnosis not present

## 2021-11-28 DIAGNOSIS — J441 Chronic obstructive pulmonary disease with (acute) exacerbation: Secondary | ICD-10-CM | POA: Insufficient documentation

## 2021-11-28 DIAGNOSIS — R079 Chest pain, unspecified: Secondary | ICD-10-CM | POA: Diagnosis not present

## 2021-11-28 DIAGNOSIS — R059 Cough, unspecified: Secondary | ICD-10-CM | POA: Diagnosis not present

## 2021-11-28 DIAGNOSIS — R918 Other nonspecific abnormal finding of lung field: Secondary | ICD-10-CM | POA: Diagnosis not present

## 2021-11-28 DIAGNOSIS — J80 Acute respiratory distress syndrome: Secondary | ICD-10-CM | POA: Diagnosis not present

## 2021-11-28 LAB — CBC
HCT: 47.7 % — ABNORMAL HIGH (ref 36.0–46.0)
Hemoglobin: 14.7 g/dL (ref 12.0–15.0)
MCH: 29.9 pg (ref 26.0–34.0)
MCHC: 30.8 g/dL (ref 30.0–36.0)
MCV: 97.1 fL (ref 80.0–100.0)
Platelets: 161 10*3/uL (ref 150–400)
RBC: 4.91 MIL/uL (ref 3.87–5.11)
RDW: 13.3 % (ref 11.5–15.5)
WBC: 6.8 10*3/uL (ref 4.0–10.5)
nRBC: 0 % (ref 0.0–0.2)

## 2021-11-28 LAB — BASIC METABOLIC PANEL
Anion gap: 10 (ref 5–15)
BUN: 15 mg/dL (ref 8–23)
CO2: 29 mmol/L (ref 22–32)
Calcium: 8.6 mg/dL — ABNORMAL LOW (ref 8.9–10.3)
Chloride: 103 mmol/L (ref 98–111)
Creatinine, Ser: 0.94 mg/dL (ref 0.44–1.00)
GFR, Estimated: >60 mL/min (ref >60)
Glucose, Bld: 102 mg/dL — ABNORMAL HIGH (ref 70–99)
Potassium: 4 mmol/L (ref 3.5–5.1)
Sodium: 142 mmol/L (ref 135–145)

## 2021-11-28 LAB — TROPONIN I (HIGH SENSITIVITY): Troponin I (High Sensitivity): 4 ng/L (ref <18)

## 2021-11-28 MED ORDER — IPRATROPIUM-ALBUTEROL 0.5-2.5 (3) MG/3ML IN SOLN
3.0000 mL | Freq: Once | RESPIRATORY_TRACT | Status: AC
  Administered 2021-11-28: 3 mL via RESPIRATORY_TRACT
  Filled 2021-11-28: qty 3

## 2021-11-28 MED ORDER — ALBUTEROL SULFATE HFA 108 (90 BASE) MCG/ACT IN AERS: 2.0000 | INHALATION_SPRAY | RESPIRATORY_TRACT | Status: DC | PRN

## 2021-11-28 MED ORDER — LORAZEPAM 2 MG/ML IJ SOLN
1.0000 mg | Freq: Once | INTRAMUSCULAR | Status: DC
  Filled 2021-11-28: qty 1

## 2021-11-28 MED ORDER — METHYLPREDNISOLONE SODIUM SUCC 125 MG IJ SOLR
125.0000 mg | Freq: Once | INTRAMUSCULAR | Status: AC
Start: 2021-11-28 — End: 2021-11-28
  Administered 2021-11-28: 125 mg via INTRAVENOUS
  Filled 2021-11-28: qty 2

## 2021-11-28 MED ORDER — IOHEXOL 350 MG/ML SOLN
75.0000 mL | Freq: Once | INTRAVENOUS | Status: AC | PRN
  Administered 2021-11-28: 75 mL via INTRAVENOUS

## 2021-11-28 NOTE — Discharge Instructions (Signed)
Continue taking doxycycline as prescribed.  If you feel short of breath, please use your albuterol and your home oxygen if needed.  Follow-up with primary care/pulmonology.  Return to the emergency department if you develop chest pain, increasing shortness of breath, or fever.

## 2021-11-28 NOTE — ED Triage Notes (Addendum)
Pt comes via EMs from home with c/o lung infection dx on Monday. Pt states she is still SOB and just not feeling any better. Pt states cough as well.   BP-154/108 98% RA HR-96  No fevers at home.   Pt has some labored breathing noted and wheezing present.

## 2021-11-28 NOTE — ED Provider Notes (Signed)
[facility] Provider Note    Event Date/Time   First MD Initiated Contact with Patient 11/28/21 1127     (approximate)   History   Shortness of Breath   HPI  Lauren Bartlett is a 66 y.o. female with history of COPD on home O2, hypertension, hypothyroidism, and remaining history as listed below and in EMR presents to the ER for treatment and evaluation of congestion and wheezing.  Patient was evaluated by pulmonology 3 days ago and was given steroid injection and prescription for doxycycline.  Patient states that she has been compliant with medications.  She has had to wear the oxygen off-and-on throughout the day instead of just at night.  She is concerned pneumonia may not be improving.  She denies fever.  Past Medical History:  Diagnosis Date   Anxiety    Bleeding from the nose    Chronic diastolic congestive heart failure ([place])    a. 03/2016 Echo: EF 55-60%, Gr2 DD; b. 08/2016 Echo: EF 60-65%, nl diast fxn; c. 01/2019 Echo: EF 55-60%. Nl RV fxn.   Congenital absence of one kidney    COPD (chronic obstructive pulmonary disease) ([place])    a. 02/2017 PFT: mod/sev obstructive airway dzs w/ significant bronchodilator response.   Depression    [facility]   Dyspnea    Emphysema/COPD [facility])    GERD (gastroesophageal reflux disease)    History of cardiac cath    a. 05/2016 Cath: nl cors.   History of sciatica    Hypertension    Hypothyroidism    Mitral Valve Prolapse & Severe mitral regurgitation s/p MVR    a. 03/2016 Echo: severe MVP involving the posterior leaflet, sev MR;  b. 06/2016 min invasive MV repair w/ triangular resection of flail segment (P2) of posterior leaflet, gore-tex neochord placement x 4, Sorin Memo 3D rin Annulosplasty (53m, catalog # [id number] ser # [id number]; c. 08/2016 Echo: MV area 2.06 cm^2 (pressure 1/2 time); d. 01/2019 Echo: Mild MS w/ mean grad 882mg.   Motion sickness    cars   Pneumonia    PONV (postoperative nausea and  vomiting)    Post-op Afib    a. 06/2016 following MV repair-->short-course amio. Coumadin d/c'd 2/2 epistaxis.   [place]   a. 08/2016 Echo: PASP 5492m; b. 01/2019 Echo: RVSP 45.3mm8m   S/P patent foramen ovale closure    a. 06/2016 @ time of MV Repair.   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     Physical Exam   Triage Vital Signs: ED Triage Vitals  Enc Vitals Group     BP 11/28/21 1100 122/61     Pulse Rate 11/28/21 1100 88     Resp 11/28/21 1100 20     Temp 11/28/21 1100 97.9 F (36.6 C)     Temp Source 11/28/21 1100 Oral     SpO2 11/28/21 1100 98 %     Weight 11/28/21 1101 246 lb (111.6 kg)     Height 11/28/21 1101 '5\' 3"'$  (1.6 m)     Head Circumference --      Peak Flow --      Pain Score 11/28/21 1051 0     Pain Loc --      Pain Edu? --      Excl. in [place]? [place]     Most recent vital signs: Vitals:   11/28/21 1330 11/28/21 1435  BP: (!) 114/38 (!)[name]  109/93  Pulse: 86 97  Resp: 17 20  Temp:    SpO2: 100% 95%    General: Awake, no distress.  CV:  Good peripheral perfusion.  Resp:  Normal effort.  Diffuse wheezing on auscultation. Abd:  No distention.  Other:     ED Results / Procedures / Treatments   Labs (all labs ordered are listed, but only abnormal results are displayed) Labs Reviewed  CBC - Abnormal; Notable for the following components:      Result Value   HCT 47.7 (*)    All other components within normal limits  BASIC METABOLIC PANEL - Abnormal; Notable for the following components:   Glucose, Bld 102 (*)    Calcium 8.6 (*)    All other components within normal limits  TROPONIN I (HIGH SENSITIVITY)     EKG  Normal sinus rhythm with a rate of 89.  No ectopy.   RADIOLOGY  Chest x-ray negative for acute concerns.  CTA chest to rule out PE negative for acute concerns.  I have independently reviewed and interpreted imaging as well as reviewed report from radiology.  PROCEDURES:  Critical Care  performed: No  Procedures   MEDICATIONS ORDERED IN ED:  Medications  albuterol (VENTOLIN HFA) 108 (90 Base) MCG/ACT inhaler 2 puff (has no administration in time range)  LORazepam (ATIVAN) injection 1 mg (1 mg Intravenous Patient Refused/Not Given 11/28/21 1347)  ipratropium-albuterol (DUONEB) 0.5-2.5 (3) MG/3ML nebulizer solution 3 mL (3 mLs Nebulization Given 11/28/21 1253)  methylPREDNISolone sodium succinate (SOLU-MEDROL) 125 mg/2 mL injection 125 mg (125 mg Intravenous Given 11/28/21 1258)  iohexol (OMNIPAQUE) 350 MG/ML injection 75 mL (75 mLs Intravenous Contrast Given 11/28/21 1354)     IMPRESSION / MDM / ASSESSMENT AND PLAN / ED COURSE   I reviewed the triage vital signs and the nursing notes.  Differential diagnosis includes, but is not limited to: COPD exacerbation, pneumonia, PE  Patient's presentation is most consistent with acute presentation with potential threat to life or bodily function.  66 year old female presenting to the emergency department for treatment and evaluation of chest congestion and shortness of breath.  See HPI for further details.  Oxygen saturation on room air is 95% with respiratory rate of 20.  No acute distress.  On exam, she does have diffuse wheezing.  Albuterol and Solu-Medrol ordered.  Awaiting results of labs and chest x-ray.  Chest x-ray without acute concerns.  Due to her multiple comorbidities, plan will be to get a CTA chest to rule out PE. Wheezing has resolved after albuterol and Solu-medrol  CTA image of the chest negative for acute concerns including mass and PE. Results of labs, EKG, and imaging discussed with patient who feels reassured. Admission for observation offered, but patient would like to go home. She was encouraged to continue using her oxygen as advised by pulmonology and continue the doxycycline. ER return precautions were discussed and she is to return if she develops increased shortness of breath that does not respond to  oxygen or albuterol, fever, or chest pain. She will follow up with primary care/pulmonology.      FINAL CLINICAL IMPRESSION(S) / ED DIAGNOSES   Final diagnoses:  COPD exacerbation ([place])     Rx / DC Orders   ED Discharge Orders     None        Note:  This document was prepared using Dragon voice recognition software and may include unintentional dictation errors.   Victorino Dike, FNP 11/28/21 1531  Carrie Mew, MD 11/28/21 [phone]

## 2021-12-06 DIAGNOSIS — J449 Chronic obstructive pulmonary disease, unspecified: Secondary | ICD-10-CM | POA: Diagnosis not present

## 2021-12-22 ENCOUNTER — Ambulatory Visit (INDEPENDENT_AMBULATORY_CARE_PROVIDER_SITE_OTHER): Payer: Medicare Other

## 2021-12-22 ENCOUNTER — Ambulatory Visit
Admission: EM | Admit: 2021-12-22 | Discharge: 2021-12-22 | Disposition: A | Discharge status: Home / Self Care | Payer: Medicare Other | Attending: Family Medicine | Admitting: Family Medicine

## 2021-12-22 DIAGNOSIS — R0602 Shortness of breath: Secondary | ICD-10-CM | POA: Diagnosis not present

## 2021-12-22 DIAGNOSIS — Z20822 Contact with and (suspected) exposure to covid-19: Secondary | ICD-10-CM | POA: Insufficient documentation

## 2021-12-22 DIAGNOSIS — J441 Chronic obstructive pulmonary disease with (acute) exacerbation: Secondary | ICD-10-CM | POA: Diagnosis not present

## 2021-12-22 LAB — SARS CORONAVIRUS 2 BY RT PCR: SARS Coronavirus 2 by RT PCR: NEGATIVE

## 2021-12-22 MED ORDER — DOXYCYCLINE HYCLATE 100 MG PO CAPS: 100.0000 mg | ORAL_CAPSULE | Freq: Two times a day (BID) | ORAL | 0 refills | Status: AC

## 2021-12-22 MED ORDER — DEXAMETHASONE SODIUM PHOSPHATE 10 MG/ML IJ SOLN
10.0000 mg | Freq: Once | INTRAMUSCULAR | Status: AC
  Administered 2021-12-22: 10 mg via INTRAMUSCULAR

## 2021-12-22 MED ORDER — DEXAMETHASONE SODIUM PHOSPHATE 10 MG/ML IJ SOLN: 10.0000 mg | Freq: Once | INTRAMUSCULAR | Status: DC

## 2021-12-22 NOTE — Discharge Instructions (Signed)
Someone will call you if your COVID test is positive.  Stop by the pharmacy to pick up your prescriptions.   Follow-up with your cardiologist and pulmonologist as scheduled.   Go to ED for red flag symptoms, including; fevers you cannot reduce with Tylenol/Motrin, severe headaches, vision changes, numbness/weakness in part of the body, lethargy, confusion, intractable vomiting, severe dehydration, chest pain, breathing difficulty, severe persistent abdominal or pelvic pain, signs of severe infection (increased redness, swelling of an area), feeling faint or passing out, dizziness, etc. You should especially go to the ED for sudden acute worsening of condition, if you do not elect to go at this time.

## 2021-12-22 NOTE — ED Provider Notes (Signed)
[facility]    CSN: [id number] Arrival date & time: 12/22/21  1433      History   Chief Complaint Chief Complaint  Patient presents with   Cough    HPI Lauren Bartlett is a 66 y.o. female.   HPI  Lauren Bartlett here for shortness of breath and wheezing for the past 3 days.  She has been using her albuterol inhaler once daily without improvement.  She has not yet taken a COVID test.  Endorses nasal congestion and slight headache.  Denies fever, chills, nausea, vomiting, diarrhea, chest pain, neck pain, jaw pain, back pain, abdominal pain, and worsening of her chronic leg swelling. Has hx of COPD. States she has never smoked but was around others who smoked. Follows with Dr Patsey Berthold and was last seen in August. She had open heart surgery.  Follows with both pulmonology and cardiology.    Past Medical History:  Diagnosis Date   Anxiety    Bleeding from the nose    Chronic diastolic congestive heart failure ([place])    a. 03/2016 Echo: EF 55-60%, Gr2 DD; b. 08/2016 Echo: EF 60-65%, nl diast fxn; c. 01/2019 Echo: EF 55-60%. Nl RV fxn.   Congenital absence of one kidney    COPD (chronic obstructive pulmonary disease) ([place])    a. 02/2017 PFT: mod/sev obstructive airway dzs w/ significant bronchodilator response.   Depression    [facility]   Dyspnea    Emphysema/COPD [facility])    GERD (gastroesophageal reflux disease)    History of cardiac cath    a. 05/2016 Cath: nl cors.   History of sciatica    Hypertension    Hypothyroidism    Mitral Valve Prolapse & Severe mitral regurgitation s/p MVR    a. 03/2016 Echo: severe MVP involving the posterior leaflet, sev MR;  b. 06/2016 min invasive MV repair w/ triangular resection of flail segment (P2) of posterior leaflet, gore-tex neochord placement x 4, Sorin Memo 3D rin Annulosplasty (34m, catalog # [id number] ser # [id number]; c. 08/2016 Echo: MV area 2.06 cm^2 (pressure 1/2 time); d. 01/2019 Echo: Mild MS w/ mean grad 866mg.   Motion  sickness    cars   Pneumonia    PONV (postoperative nausea and vomiting)    Post-op Afib    a. 06/2016 following MV repair-->short-course amio. Coumadin d/c'd 2/2 epistaxis.   [place]   a. 08/2016 Echo: PASP 5446m; b. 01/2019 Echo: RVSP 45.3mm52m   S/P patent foramen ovale closure    a. 06/2016 @ time of MV Repair.   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    Patient Active Problem List   Diagnosis Date Noted   Osteopenia determined by x-ray 08/28/2021   Acute bronchitis with COPD (HCC)[place]/13/2023   Unsteady gait when walking 04/29/2021   Aortic atherosclerosis (HCC)[place]/19/2022   Recurrent epistaxis 06/16/2020   PAF (paroxysmal atrial fibrillation) (HCC)[place]/19/2020   Nocturnal hypoxemia due to obstructive chronic bronchitis (HCC)[place]/11/2017   Encounter for therapeutic drug monitoring 08/13/2016   S/P minimally invasive mitral valve repair 07/17/2016   S/P patent foramen ovale closure 07/17/2016   Chronic diastolic congestive heart failure (HCC)    COPD (chronic obstructive pulmonary disease) (HCC)[place]/19/2018   Vitamin D deficiency 01/11/2015   Allergic rhinitis 07/09/2014   S/P hysterectomy with oophorectomy 09/22/2013   Pulmonary hypertension (HCC)[place] Hypocalcemia 09/10/2012   Hypothyroidism 05/04/2012   Morbid obesity (  [place]) 08/04/2011   Hypertension    Congenital absence of one kidney    History of sciatica     Past Surgical History:  Procedure Laterality Date   ABDOMINAL HYSTERECTOMY     BREAST BIOPSY Right 2011   [facility]< benign   CATARACT EXTRACTION W/PHACO Left 11/14/2015   Procedure: CATARACT EXTRACTION PHACO AND INTRAOCULAR LENS PLACEMENT ([place]);  Surgeon: Leandrew Koyanagi, MD;  Location: [place];  Service: Ophthalmology;  Laterality: Left;  sleep apnea [place]   CATARACT EXTRACTION W/PHACO Right 12/19/2015   Procedure: CATARACT EXTRACTION PHACO AND INTRAOCULAR LENS PLACEMENT (IOC);  Surgeon: Leandrew Koyanagi, MD;  Location: [place];  Service: Ophthalmology;  Laterality: Right;  ANXIETY GENEROUS IV SEDATION TORIC LEN   COMBINED HYSTERECTOMY ABDOMINAL W/ A&P REPAIR / OOPHORECTOMY  1996   benign tumor   INNER EAR SURGERY     bilateral   MITRAL VALVE REPAIR Right 07/17/2016   Procedure: MINIMALLY INVASIVE MITRAL VALVE REPAIR (MVR) USING 26 SORIN MEMO 3D ANNULOPLASTY RING;  Surgeon: Rexene Alberts, MD;  Location: [place];  Service: Open Heart Surgery;  Laterality: Right;   PATENT FORAMEN OVALE(PFO) CLOSURE N/A 07/17/2016   Procedure: PATENT FORAMEN OVALE (PFO) CLOSURE;  Surgeon: Rexene Alberts, MD;  Location: [place];  Service: Open Heart Surgery;  Laterality: N/A;   RIGHT/LEFT HEART CATH AND CORONARY ANGIOGRAPHY Bilateral 06/16/2016   Procedure: Right/Left Heart Cath and Coronary Angiography;  Surgeon: Wellington Hampshire, MD;  Location: [place] [facility];  Service: Cardiovascular;  Laterality: Bilateral;   TEE WITHOUT CARDIOVERSION N/A 05/19/2016   Procedure: TRANSESOPHAGEAL ECHOCARDIOGRAM (TEE);  Surgeon: Wellington Hampshire, MD;  Location: [facility];  Service: Cardiovascular;  Laterality: N/A;   TEE WITHOUT CARDIOVERSION N/A 06/09/2016   Procedure: TRANSESOPHAGEAL ECHOCARDIOGRAM (TEE);  Surgeon: Wellington Hampshire, MD;  Location: [facility];  Service: Cardiovascular;  Laterality: N/A;   TEE WITHOUT CARDIOVERSION N/A 06/16/2016   Procedure: TRANSESOPHAGEAL ECHOCARDIOGRAM (TEE);  Surgeon: Wellington Hampshire, MD;  Location: [facility];  Service: Cardiovascular;  Laterality: N/A;   TEE WITHOUT CARDIOVERSION N/A 07/17/2016   Procedure: TRANSESOPHAGEAL ECHOCARDIOGRAM (TEE);  Surgeon: Rexene Alberts, MD;  Location: [place];  Service: Open Heart Surgery;  Laterality: N/A;   TONSILLECTOMY      OB History   No obstetric history on file.      Home Medications    Prior to Admission medications   Medication Sig Start Date End Date Taking? Authorizing Provider  doxycycline (VIBRAMYCIN) 100 MG capsule  Take 1 capsule (100 mg total) by mouth 2 (two) times daily for 7 days. 12/22/21 12/29/21 Yes Welda Azzarello, DO  acetaminophen (TYLENOL) 500 MG tablet Take 500 mg by mouth 2 (two) times daily as needed for moderate pain or headache.    [provider]  albuterol (PROVENTIL) (2.5 MG/3ML) 0.083% nebulizer solution INHALE THE CONTENTS OF 1 VIAL VIA NEBULIZER EVERY 6 HOURS AS NEEDED FOR WHEEZING  OR FOR SHORTNESS OF BREATH 07/11/21   [name], MD  albuterol (VENTOLIN HFA) 108 (90 Base) MCG/ACT inhaler INHALE 2 INHALATIONS BY  MOUTH INTO THE LUNGS EVERY  6 HOURS AS NEEDED FOR  WHEEZING OR SHORTNESS OF  BREATH (USE WITH SPACER) 11/13/21   [name], MD  aspirin EC 81 MG EC tablet Take 1 tablet (81 mg total) by mouth daily. 07/23/16   Barrett, Erin R, PA-C  benzonatate (TESSALON) 200 MG capsule Take 1 capsule (200 mg total) by mouth 3 (three) times daily as  needed for cough. 07/11/21   [name], MD  calcitRIOL (ROCALTROL) 0.25 MCG capsule TAKE 1 CAPSULE BY MOUTH DAILY 09/15/21   Dutch Quint B, FNP  Calcium Carb-Cholecalciferol 600-800 MG-UNIT TABS Take 1 tablet by mouth in the morning, at noon, and at bedtime.     [provider]  docusate sodium (COLACE) 100 MG capsule Take 100 mg by mouth daily.    [provider]  famotidine (PEPCID AC) 10 MG chewable tablet Chew 20 mg by mouth daily as needed for heartburn.     [provider]  ferrous sulfate 325 (65 FE) MG tablet Take 325 mg by mouth daily with breakfast.    [provider]  Fluticasone-Umeclidin-Vilant (TRELEGY ELLIPTA) 100-62.5-25 MCG/ACT AEPB Inhale 1 puff into the lungs daily. 08/28/21   [name], MD  furosemide (LASIX) 20 MG tablet TAKE 1 TABLET BY MOUTH DAILY MAY TAKE AN ADDITIONAL TABLET AS  NEEDED FOR WEIGHT GAIN OF 2 LBS  OVERNIGHT OR 5 LBS IN 1 WEEK 08/20/21   [name], MD  levothyroxine (SYNTHROID) 150 MCG tablet TAKE 1 TABLET BY MOUTH  DAILY BEFORE BREAKFAST 06/04/21    Dutch Quint B, FNP  lidocaine (LIDODERM) 5 % Place 1 patch onto the skin every 12 (twelve) hours. Remove & Discard patch within 12 hours or as directed by MD 06/18/21   Carrie Mew, MD  loratadine (CLARITIN) 10 MG tablet Take 10 mg by mouth daily.    [provider]  losartan (COZAAR) 50 MG tablet TAKE 1 TABLET BY MOUTH  DAILY 06/04/21   Dutch Quint B, FNP  OXYGEN Inhale 2 L into the lungs at bedtime as needed (at bedtime and as needed).    [provider]  oxymetazoline (AFRIN) 0.05 % nasal spray Place 1 spray into both nostrils 2 (two) times daily. As needed for nose bleed    [provider]  polyethylene glycol powder (GLYCOLAX/MIRALAX) powder Take 17 g by mouth 2 (two) times daily as needed. 08/03/15   [name], MD  traMADol (ULTRAM) 50 MG tablet Take 1-2 tablets (50-100 mg total) by mouth every 4 (four) hours as needed for moderate pain. 08/21/16   [name], MD  vitamin B-12 (CYANOCOBALAMIN) 1000 MCG tablet Take 1 tablet (1,000 mcg total) by mouth daily. 08/14/16   [name], MD  Vitamin D, Ergocalciferol, (DRISDOL) 1.25 MG (50000 UNIT) CAPS capsule TAKE 1 CAPSULE BY MOUTH 1 TIME A WEEK 11/21/21   [name], MD    Family History Family History  Problem Relation Age of Onset   Multiple sclerosis Mother    Hypertension Mother    Coronary artery disease Father    Heart disease Father    Hypertension Father    Heart disease Brother    Cancer Brother    Breast cancer Paternal Aunt     Social History Social History   Tobacco Use   Smoking status: Never   Smokeless tobacco: Never  Vaping Use   Vaping Use: Never used  Substance Use Topics   Alcohol use: No   Drug use: No     Allergies   Codeine, Levaquin [levofloxacin], Penicillins, Zithromax [azithromycin], and Prednisone   Review of Systems Review of Systems : :negative unless otherwise stated in HPI.      Physical Exam Triage Vital Signs ED Triage Vitals  Enc  Vitals Group     BP 12/22/21 1454 124/84     Pulse Rate 12/22/21 1454  98     Resp 12/22/21 1454 20     Temp 12/22/21 1454 98.4 F (36.9 C)     Temp Source 12/22/21 1454 Oral     SpO2 12/22/21 1454 95 %     Weight 12/22/21 1455 248 lb (112.5 kg)     Height 12/22/21 1455 '5\' 2"'$  (1.575 m)     Head Circumference --      Peak Flow --      Pain Score 12/22/21 1457 0     Pain Loc --      Pain Edu? --      Excl. in [place]? --    No data found.  Updated Vital Signs BP 124/84 (BP Location: Left Arm)   Pulse 98   Temp 98.4 F (36.9 C) (Oral)   Resp 20   Ht '5\' 2"'$  (1.575 m)   Wt 112.5 kg   SpO2 95%   BMI 45.36 kg/m   Visual Acuity Right Eye Distance:   Left Eye Distance:   Bilateral Distance:    Right Eye Near:   Left Eye Near:    Bilateral Near:     Physical Exam  GEN: chronically ill-appearing appearing female, in no acute distress  CV: regular rate and rhythm RESP: no increased work of breathing, diffuse expiratory wheezing, no rales, no rhonchi MSK: baseline chronic LE edema (per pt), walking cane present, seated in wheelchair  SKIN: warm, dry NEURO: alert, moves all extremities appropriately PSYCH: Normal affect, appropriate speech and behavior   UC Treatments / Results  Labs (all labs ordered are listed, but only abnormal results are displayed) Labs Reviewed  SARS CORONAVIRUS 2 BY RT PCR    EKG   Radiology DG Chest 2 View  Result Date: 12/22/2021 CLINICAL DATA:  Shortness of breath EXAM: CHEST - 2 VIEW COMPARISON:  11/28/2021 chest x-ray and chest CT, radiograph 11/26/2020 FINDINGS: Chronic elevation of the right diaphragm with atelectasis or scar at the right base. No acute consolidation, pleural effusion, or pneumothorax. Stable cardiomediastinal silhouette. Valve prosthesis. Aortic atherosclerosis. IMPRESSION: No active cardiopulmonary disease. Chronic elevation of right diaphragm with atelectasis or scar at the right base Electronically Signed   By: Donavan Foil M.D.   On: 12/22/2021 15:11    Procedures Procedures (including critical care time)  Medications Ordered in UC Medications  dexamethasone (DECADRON) injection 10 mg (10 mg Intramuscular Given 12/22/21 1533)    Initial Impression / Assessment and Plan / UC Course  I have reviewed the triage vital signs and the nursing notes.  Pertinent labs & imaging results that were available during my care of the patient were reviewed by me and considered in my medical decision making (see chart for details).       Patient is a 66 y.o. year old female who presents for shortness of breath for the past 3 days.  VSS.  Overall patient is mostly well-appearing, well-hydrated and without respiratory distress.  On exam, she has diffuse expiratory wheezing.  Unable to give her a DuoNeb as she has a pending COVID test.  CXR obtained and personally reviewed is without pneumothorax, focal pneumonia or pleural effusion.  Radiologist reports atelectasis versus scarring at the right lung base.  COVID test is negative.  I suspect she has an acute exacerbation of COPD.  States that she cannot take oral prednisone.  She prefers injections.  Given 10 mg Decadron IM today.  Reviewed recent note from pulmonology.  Prescription written for doxycycline as well.  She  is to follow-up with her pulmonologist as scheduled.   Discussed MDM, treatment plan and plan for follow-up with patient who agrees with plan.       Final Clinical Impressions(s) / UC Diagnoses   Final diagnoses:  COPD exacerbation [facility])     Discharge Instructions      Someone will call you if your COVID test is positive.  Stop by the pharmacy to pick up your prescriptions.   Follow-up with your cardiologist and pulmonologist as scheduled.   Go to ED for red flag symptoms, including; fevers you cannot reduce with Tylenol/Motrin, severe headaches, vision changes, numbness/weakness in part of the body, lethargy, confusion, intractable vomiting,  severe dehydration, chest pain, breathing difficulty, severe persistent abdominal or pelvic pain, signs of severe infection (increased redness, swelling of an area), feeling faint or passing out, dizziness, etc. You should especially go to the ED for sudden acute worsening of condition, if you do not elect to go at this time.       ED Prescriptions     Medication Sig Dispense Auth. Provider   doxycycline (VIBRAMYCIN) 100 MG capsule Take 1 capsule (100 mg total) by mouth 2 (two) times daily for 7 days. 14 capsule Shiza Thelen, DO      PDMP not reviewed this encounter.   Lyndee Hensen, DO 12/22/21 1916

## 2021-12-22 NOTE — ED Triage Notes (Signed)
Patient reports wheezing and shortness of breathe x day 3

## 2021-12-30 ENCOUNTER — Ambulatory Visit: Payer: Medicare Other | Admitting: Internal Medicine

## 2022-01-05 DIAGNOSIS — J449 Chronic obstructive pulmonary disease, unspecified: Secondary | ICD-10-CM | POA: Diagnosis not present

## 2022-01-20 ENCOUNTER — Ambulatory Visit: Payer: Medicare Other | Admitting: Medical

## 2022-01-23 ENCOUNTER — Ambulatory Visit: Payer: Medicare Other | Admitting: Internal Medicine

## 2022-01-25 ENCOUNTER — Other Ambulatory Visit: Payer: Self-pay | Admitting: Family

## 2022-01-25 DIAGNOSIS — E034 Atrophy of thyroid (acquired): Secondary | ICD-10-CM

## 2022-02-04 ENCOUNTER — Telehealth: Payer: Self-pay

## 2022-02-04 DIAGNOSIS — E034 Atrophy of thyroid (acquired): Secondary | ICD-10-CM

## 2022-02-04 NOTE — Telephone Encounter (Signed)
Pt called stating that [facility] has reached out to Dr.Tullo in regards to some medication refills.  She is wondering if Dr. Derrel Nip has received the notice.

## 2022-02-05 DIAGNOSIS — J449 Chronic obstructive pulmonary disease, unspecified: Secondary | ICD-10-CM | POA: Diagnosis not present

## 2022-02-06 MED ORDER — LOSARTAN POTASSIUM 50 MG PO TABS: 50.0000 mg | ORAL_TABLET | Freq: Every day | ORAL | 3 refills | Status: DC

## 2022-02-06 MED ORDER — LEVOTHYROXINE SODIUM 150 MCG PO TABS: 150.0000 ug | ORAL_TABLET | Freq: Every day | ORAL | 3 refills | Status: DC

## 2022-02-06 MED ORDER — ALBUTEROL SULFATE (2.5 MG/3ML) 0.083% IN NEBU: INHALATION_SOLUTION | RESPIRATORY_TRACT | 1 refills | Status: DC

## 2022-02-06 MED ORDER — TRELEGY ELLIPTA 100-62.5-25 MCG/ACT IN AEPB: 1.0000 | INHALATION_SPRAY | Freq: Every day | RESPIRATORY_TRACT | 3 refills | Status: DC

## 2022-02-06 NOTE — Addendum Note (Signed)
Addended by: Adair Laundry on: 02/06/2022 02:07 PM   Modules accepted: Orders

## 2022-02-06 NOTE — Telephone Encounter (Signed)
Medication has been refilled. Pt is aware.

## 2022-02-10 ENCOUNTER — Ambulatory Visit: Payer: Medicare Other | Attending: Medical | Admitting: Medical

## 2022-02-10 ENCOUNTER — Encounter: Payer: Self-pay | Admitting: Medical

## 2022-02-10 VITALS — BP 128/83 | HR 86 | Ht 62.0 in | Wt 249.0 lb

## 2022-02-10 DIAGNOSIS — I050 Rheumatic mitral stenosis: Secondary | ICD-10-CM | POA: Diagnosis not present

## 2022-02-10 DIAGNOSIS — Z9889 Other specified postprocedural states: Secondary | ICD-10-CM

## 2022-02-10 DIAGNOSIS — I10 Essential (primary) hypertension: Secondary | ICD-10-CM

## 2022-02-10 DIAGNOSIS — I5032 Chronic diastolic (congestive) heart failure: Secondary | ICD-10-CM | POA: Diagnosis not present

## 2022-02-10 DIAGNOSIS — I2721 Secondary pulmonary arterial hypertension: Secondary | ICD-10-CM

## 2022-02-10 NOTE — Progress Notes (Signed)
Cardiology Office Note:    Date:  02/10/2022   ID:  Lauren Bartlett, DOB October 31, 1955, MRN [id number]  PCP:  [name], MD  [facility] Cardiologist:  Kathlyn Sacramento, MD  [facility] Electrophysiologist:  None   Referring MD: [name], MD   Chief Complaint: 6 month follow-up  History of Present Illness:    Lauren Bartlett is a 66 y.o. female with a hx of mitral valve prolapse, severe mitral regurgitation status post mitral valve repair in April 2018, hypertension, HFpEF, COPD, pulmonary arterial hypertension, congenital solitary kidney, hypothyroidism who presents for 35-monthfollow-up.  Patient has a history of mitral valve disease status postrepair due to mitral valve prolapse and severe MR in April 2018.  Catheterization prior to valvular repair revealed normal coronaries.  Echo 08/2016 showed normal LVEF, mild to moderate TR, PAH with past 54 mmHg.  Echo in November 2020 showed normal LVSF, mild mitral stenosis, RVSP 45.3 mmHg.  Echo April 2022 showed stable findings with a EF 55 to 60%, mildly elevated PASP, mild LAE, moderate MS was noted with a mean mitral valve gradient 8 mmHg, prosthetic angioplasty ring was present in the mitral position.  Aortic valve is not well visualized but appears to be functioning well.  Last seen 12/11/2020 via telemedicine.  No changes were made.  Today, the patient reports she is going to get hearing aids. No other significant changes. The patient has some wheezing. She denies chest pain or shortness of breath. She uses a cane or a walker. She has an aid that comes every day. She denies lower leg edema, orthopnea or pnd. She is not taking Asprin due to nosebleeds.   Past Medical History:  Diagnosis Date   Anxiety    Bleeding from the nose    Chronic diastolic congestive heart failure ([place]    a. 03/2016 Echo: EF 55-60%, Gr2 DD; b. 08/2016 Echo: EF 60-65%, nl diast fxn; c. 01/2019 Echo: EF 55-60%. Nl RV fxn.   Congenital absence of one  kidney    COPD (chronic obstructive pulmonary disease) ([place]    a. 02/2017 PFT: mod/sev obstructive airway dzs w/ significant bronchodilator response.   Depression    [facility]   Dyspnea    Emphysema/COPD ([facility]    GERD (gastroesophageal reflux disease)    History of cardiac cath    a. 05/2016 Cath: nl cors.   History of sciatica    Hypertension    Hypothyroidism    Mitral Valve Prolapse & Severe mitral regurgitation s/p MVR    a. 03/2016 Echo: severe MVP involving the posterior leaflet, sev MR;  b. 06/2016 min invasive MV repair w/ triangular resection of flail segment (P2) of posterior leaflet, gore-tex neochord placement x 4, Sorin Memo 3D rin Annulosplasty (217m catalog # [id number] # [id number] c. 08/2016 Echo: MV area 2.06 cm^2 (pressure 1/2 time); d. 01/2019 Echo: Mild MS w/ mean grad 46m31m.   Motion sickness    cars   Pneumonia    PONV (postoperative nausea and vomiting)    Post-op Afib    a. 06/2016 following MV repair-->short-course amio. Coumadin d/c'd 2/2 epistaxis.   Pulmonary hypertension ([place]  a. 08/2016 Echo: PASP 37m12m b. 01/2019 Echo: RVSP 45.3mmH83m  S/P patent foramen ovale closure    a. 06/2016 @ time of MV Repair.   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    Past Surgical History:  Procedure  Laterality Date   ABDOMINAL HYSTERECTOMY     BREAST BIOPSY Right 2011   [facility]< benign   CATARACT EXTRACTION W/PHACO Left 11/14/2015   Procedure: CATARACT EXTRACTION PHACO AND INTRAOCULAR LENS PLACEMENT (IOC);  Surgeon: Leandrew Koyanagi, MD;  Location: [place];  Service: Ophthalmology;  Laterality: Left;  sleep apnea Toric   CATARACT EXTRACTION W/PHACO Right 12/19/2015   Procedure: CATARACT EXTRACTION PHACO AND INTRAOCULAR LENS PLACEMENT (IOC);  Surgeon: Leandrew Koyanagi, MD;  Location: [place];  Service: Ophthalmology;  Laterality: Right;  ANXIETY GENEROUS IV SEDATION TORIC LEN   COMBINED HYSTERECTOMY  ABDOMINAL W/ A&P REPAIR / OOPHORECTOMY  1996   benign tumor   INNER EAR SURGERY     bilateral   MITRAL VALVE REPAIR Right 07/17/2016   Procedure: MINIMALLY INVASIVE MITRAL VALVE REPAIR (MVR) USING 26 SORIN MEMO 3D ANNULOPLASTY RING;  Surgeon: Rexene Alberts, MD;  Location: [place];  Service: Open Heart Surgery;  Laterality: Right;   PATENT FORAMEN OVALE(PFO) CLOSURE N/A 07/17/2016   Procedure: PATENT FORAMEN OVALE (PFO) CLOSURE;  Surgeon: Rexene Alberts, MD;  Location: [place];  Service: Open Heart Surgery;  Laterality: N/A;   RIGHT/LEFT HEART CATH AND CORONARY ANGIOGRAPHY Bilateral 06/16/2016   Procedure: Right/Left Heart Cath and Coronary Angiography;  Surgeon: Wellington Hampshire, MD;  Location: [place] [facility];  Service: Cardiovascular;  Laterality: Bilateral;   TEE WITHOUT CARDIOVERSION N/A 05/19/2016   Procedure: TRANSESOPHAGEAL ECHOCARDIOGRAM (TEE);  Surgeon: Wellington Hampshire, MD;  Location: [facility];  Service: Cardiovascular;  Laterality: N/A;   TEE WITHOUT CARDIOVERSION N/A 06/09/2016   Procedure: TRANSESOPHAGEAL ECHOCARDIOGRAM (TEE);  Surgeon: Wellington Hampshire, MD;  Location: [facility];  Service: Cardiovascular;  Laterality: N/A;   TEE WITHOUT CARDIOVERSION N/A 06/16/2016   Procedure: TRANSESOPHAGEAL ECHOCARDIOGRAM (TEE);  Surgeon: Wellington Hampshire, MD;  Location: [facility];  Service: Cardiovascular;  Laterality: N/A;   TEE WITHOUT CARDIOVERSION N/A 07/17/2016   Procedure: TRANSESOPHAGEAL ECHOCARDIOGRAM (TEE);  Surgeon: Rexene Alberts, MD;  Location: [place];  Service: Open Heart Surgery;  Laterality: N/A;   TONSILLECTOMY      Current Medications: Current Meds  Medication Sig   acetaminophen (TYLENOL) 500 MG tablet Take 500 mg by mouth 2 (two) times daily as needed for moderate pain or headache.   albuterol (PROVENTIL) (2.5 MG/3ML) 0.083% nebulizer solution INHALE THE CONTENTS OF 1 VIAL VIA NEBULIZER EVERY 6 HOURS AS NEEDED FOR WHEEZING  OR FOR SHORTNESS OF BREATH   albuterol (VENTOLIN HFA)  108 (90 Base) MCG/ACT inhaler INHALE 2 INHALATIONS BY  MOUTH INTO THE LUNGS EVERY  6 HOURS AS NEEDED FOR  WHEEZING OR SHORTNESS OF  BREATH (USE WITH SPACER)   aspirin EC 81 MG EC tablet Take 1 tablet (81 mg total) by mouth daily.   benzonatate (TESSALON) 200 MG capsule Take 1 capsule (200 mg total) by mouth 3 (three) times daily as needed for cough.   calcitRIOL (ROCALTROL) 0.25 MCG capsule TAKE 1 CAPSULE BY MOUTH DAILY   Calcium Carb-Cholecalciferol 600-800 MG-UNIT TABS Take 1 tablet by mouth in the morning, at noon, and at bedtime.    docusate sodium (COLACE) 100 MG capsule Take 100 mg by mouth daily.   famotidine (PEPCID AC) 10 MG chewable tablet Chew 20 mg by mouth daily as needed for heartburn.    ferrous sulfate 325 (65 FE) MG tablet Take 325 mg by mouth daily with breakfast.   Fluticasone-Umeclidin-Vilant (TRELEGY ELLIPTA) 100-62.5-25 MCG/ACT AEPB Inhale 1 puff into the lungs daily.  furosemide (LASIX) 20 MG tablet TAKE 1 TABLET BY MOUTH DAILY MAY TAKE AN ADDITIONAL TABLET AS  NEEDED FOR WEIGHT GAIN OF 2 LBS  OVERNIGHT OR 5 LBS IN 1 WEEK   levothyroxine (SYNTHROID) 150 MCG tablet Take 1 tablet (150 mcg total) by mouth daily before breakfast.   lidocaine (LIDODERM) 5 % Place 1 patch onto the skin every 12 (twelve) hours. Remove & Discard patch within 12 hours or as directed by MD   loratadine (CLARITIN) 10 MG tablet Take 10 mg by mouth daily.   losartan (COZAAR) 50 MG tablet Take 1 tablet (50 mg total) by mouth daily.   OXYGEN Inhale 2 L into the lungs at bedtime as needed (at bedtime and as needed).   oxymetazoline (AFRIN) 0.05 % nasal spray Place 1 spray into both nostrils 2 (two) times daily. As needed for nose bleed   polyethylene glycol powder (GLYCOLAX/MIRALAX) powder Take 17 g by mouth 2 (two) times daily as needed.   traMADol (ULTRAM) 50 MG tablet Take 1-2 tablets (50-100 mg total) by mouth every 4 (four) hours as needed for moderate pain.   vitamin B-12 (CYANOCOBALAMIN) 1000 MCG  tablet Take 1 tablet (1,000 mcg total) by mouth daily.   Vitamin D, Ergocalciferol, (DRISDOL) 1.25 MG (50000 UNIT) CAPS capsule TAKE 1 CAPSULE BY MOUTH 1 TIME A WEEK   Current Facility-Administered Medications for the 02/10/22 encounter (Office Visit) with Kathlen Mody, Josecarlos Harriott H, PA-C  Medication   albuterol (PROVENTIL) (2.5 MG/3ML) 0.083% nebulizer solution 2.5 mg     Allergies:   Codeine, Levaquin [levofloxacin], Penicillins, Zithromax [azithromycin], and Prednisone   Social History   Socioeconomic History   Marital status: Single    Spouse name: Not on file   Number of children: Not on file   Years of education: Not on file   Highest education level: Not on file  Occupational History   Occupation: cna    Comment: part time  Tobacco Use   Smoking status: Never   Smokeless tobacco: Never  Vaping Use   Vaping Use: Never used  Substance and Sexual Activity   Alcohol use: No   Drug use: No   Sexual activity: Not Currently  Other Topics Concern   Not on file  Social History Narrative   Lives in a group home for adults         Social Determinants of Health   Financial Resource Strain: Low Risk  (02/18/2021)   Overall Financial Resource Strain (CARDIA)    Difficulty of Paying Living Expenses: Not hard at all  Food Insecurity: No Food Insecurity (02/18/2021)   Hunger Vital Sign    Worried About Running Out of Food in the Last Year: Never true    Ran Out of Food in the Last Year: Never true  Transportation Needs: No Transportation Needs (02/18/2021)   PRAPARE - Hydrologist (Medical): No    Lack of Transportation (Non-Medical): No  Physical Activity: Insufficiently Active (02/18/2021)   Exercise Vital Sign    Days of Exercise per Week: 7 days    Minutes of Exercise per Session: 10 min  Stress: No Stress Concern Present (02/18/2021)   [place]    Feeling of Stress : Not at all   Social Connections: Unknown (02/18/2021)   Social Connection and Isolation Panel [NHANES]    Frequency of Communication with Friends and Family: More than three times a week    Frequency of Social  Gatherings with Friends and Family: More than three times a week    Attends Religious Services: More than 4 times per year    Active Member of [employer] or Organizations: Not on file    Attends Archivist Meetings: Not on file    Marital Status: Never married     Family History: The patient's family history includes Breast cancer in her paternal aunt; Cancer in her brother; Coronary artery disease in her father; Heart disease in her brother and father; Hypertension in her father and mother; Multiple sclerosis in her mother.  ROS:   Please see the history of present illness.     All other systems reviewed and are negative.  EKGs/Labs/Other Studies Reviewed:    The following studies were reviewed today:  Echo 06/2020  1. Left ventricular ejection fraction, by estimation, is 55 to 60%. The  left ventricle has normal function. Left ventricular endocardial border  not optimally defined to evaluate regional wall motion. Left ventricular  diastolic parameters are  indeterminate.   2. Right ventricular systolic function is normal. The right ventricular  size is normal. There is mildly elevated pulmonary artery systolic  pressure.   3. Left atrial size was mildly dilated.   4. The mitral valve was not well visualized. No evidence of mitral valve  regurgitation. Moderate mitral stenosis. The mean mitral valve gradient is  8.0 mmHg. There is a prosthetic annuloplasty ring present in the mitral  position. Procedure Date: 06/2016.   5. The aortic valve was not well visualized. Aortic valve regurgitation  is not visualized. No aortic stenosis is present.   6. The inferior vena cava is normal in size with <50% respiratory  variability, suggesting right atrial pressure of 8 mmHg.   Cardiac  cath 2018  The left ventricular systolic function is normal. LV end diastolic pressure is normal. The left ventricular ejection fraction is 55-65% by visual estimate. There is severe (4+) mitral regurgitation.   1. Normal coronary arteries. Catheter-induced spasm in the ostial right coronary artery with no significant pressure dampening. This improved with catheter pullback. 2. Normal LV systolic function. 3. Severe mitral regurgitation by left ventricular angiography with dilated left atrium. 4. Right heart catheterization showed mildly elevated filling pressures, prominent V wave on pulmonary capillary wedge pressure, moderate pulmonary hypertension and high cardiac output. RA pressure: 6 mmHg, RV pressure: 53/7, PA pressure: 52/15 with a mean of 35 mmHg, pulmonary capillary wedge pressure: 17 mmHg. Cardiac output was 6.9 with a cardiac index of 3.5   Recommendations: Recommend evaluation for mitral valve repair. The patient needs to see pulmonary in order to make sure she is a candidate.    EKG:  EKG is ordered today.  The ekg ordered today demonstrates NSR, 86bpm, TWI aVL  Recent Labs: 04/29/2021: Magnesium 1.9; TSH 0.65 06/18/2021: ALT 19 11/28/2021: BUN 15; Creatinine, Ser 0.94; Hemoglobin 14.7; Platelets 161; Potassium 4.0; Sodium 142  Recent Lipid Panel    Component Value Date/Time   CHOL 192 04/29/2021 1045   TRIG 92.0 04/29/2021 1045   HDL 59.90 04/29/2021 1045   CHOLHDL 3 04/29/2021 1045   VLDL 18.4 04/29/2021 1045   LDLCALC 114 (H) 04/29/2021 1045   LDLDIRECT 101.0 07/30/2017 1007    Physical Exam:    VS:  BP 128/83 (BP Location: Right Arm, Patient Position: Sitting, Cuff Size: Large)   Pulse 86   Ht '5\' 2"'$  (1.575 m)   Wt 249 lb (112.9 kg)   SpO2 96%   BMI  45.54 kg/m     Wt Readings from Last 3 Encounters:  02/10/22 249 lb (112.9 kg)  12/22/21 248 lb (112.5 kg)  11/28/21 246 lb (111.6 kg)     GEN:  Well nourished, well developed in no acute distress HEENT:  Normal NECK: No JVD; No carotid bruits LYMPHATICS: No lymphadenopathy CARDIAC: RRR, no murmurs, rubs, gallops RESPIRATORY:  Clear to auscultation without rales, wheezing or rhonchi  ABDOMEN: Soft, non-tender, non-distended MUSCULOSKELETAL:  No edema; No deformity  SKIN: Warm and dry NEUROLOGIC:  Alert and oriented x 3 PSYCHIATRIC:  Normal affect   ASSESSMENT:    1. Chronic heart failure with preserved ejection fraction ([place])   2. S/P mitral valve repair   3. Mitral valve stenosis, unspecified etiology   4. H/O mitral valve repair   5. PAH (pulmonary artery hypertension) ([place])   6. Essential hypertension    PLAN:    In order of problems listed above:  Chronic HFpEF Pulmonary HTN Patient has trace lower leg edema on exam.  She takes Lasix 20 mg daily.  Patient advised to take extra Lasix as needed.  Continue to monitor salt and fluid intake.  Continue losartan.  COPD No wheezing on exam.Continue follow-up with pulmonology  HTN BP is well controlled.  Continue current medications.  This post mitral valve repair Most recent echo showed moderate MS and MV gradient 8 mmHg.  No significant murmur on exam. I will repeat an echo.  Disposition: Follow up in 6 month(s) with MD/APP    Signed, Tilda Samudio Ninfa Meeker, PA-C  02/10/2022 12:16 PM    [facility]

## 2022-02-10 NOTE — Patient Instructions (Signed)
Medication Instructions:  Your physician recommends that you continue on your current medications as directed. Please refer to the Current Medication list given to you today.  *If you need a refill on your cardiac medications before your next appointment, please call your pharmacy*   Lab Work: None ordered  If you have labs (blood work) drawn today and your tests are completely normal, you will receive your results only by: [place] (if you have MyChart) OR A paper copy in the mail If you have any lab test that is abnormal or we need to change your treatment, we will call you to review the results.   Testing/Procedures: Your physician has requested that you have an echocardiogram. Echocardiography is a painless test that uses sound waves to create images of your heart. It provides your doctor with information about the size and shape of your heart and how well your heart's chambers and valves are working. This procedure takes approximately one hour. There are no restrictions for this procedure. Please do NOT wear cologne, perfume, aftershave, or lotions (deodorant is allowed). Please arrive 15 minutes prior to your appointment time.  Follow-Up: At [facility], you and your health needs are our priority.  As part of our continuing mission to provide you with exceptional heart care, we have created designated Provider Care Teams.  These Care Teams include your primary Cardiologist (physician) and Advanced Practice Providers (APPs -  Physician Assistants and Nurse Practitioners) who all work together to provide you with the care you need, when you need it.  We recommend signing up for the patient portal called "MyChart".  Sign up information is provided on this After Visit Summary.  MyChart is used to connect with patients for Virtual Visits (Telemedicine).  Patients are able to view lab/test results, encounter notes, upcoming appointments, etc.  Non-urgent messages can be sent  to your provider as well.   To learn more about what you can do with MyChart, go to [email or web address].    Your next appointment:   6 month(s)  The format for your next appointment:   In Person  Provider:   You may see Kathlyn Sacramento, MD or one of the following Advanced Practice Providers on your designated Care Team:   Murray Hodgkins, NP Christell Faith, PA-C Cadence Kathlen Mody, PA-C Gerrie Nordmann, NP  Important Information About Sugar

## 2022-02-12 ENCOUNTER — Ambulatory Visit: Payer: Medicare Other | Admitting: Internal Medicine

## 2022-02-17 DIAGNOSIS — H903 Sensorineural hearing loss, bilateral: Secondary | ICD-10-CM | POA: Diagnosis not present

## 2022-02-19 ENCOUNTER — Ambulatory Visit (INDEPENDENT_AMBULATORY_CARE_PROVIDER_SITE_OTHER): Payer: Medicare Other

## 2022-02-19 VITALS — Ht 62.0 in | Wt 249.0 lb

## 2022-02-19 DIAGNOSIS — Z1231 Encounter for screening mammogram for malignant neoplasm of breast: Secondary | ICD-10-CM | POA: Diagnosis not present

## 2022-02-19 DIAGNOSIS — Z0000 Encounter for general adult medical examination without abnormal findings: Secondary | ICD-10-CM | POA: Diagnosis not present

## 2022-02-19 NOTE — Patient Instructions (Addendum)
Lauren Bartlett , Thank you for taking time to come for your Medicare Wellness Visit. I appreciate your ongoing commitment to your health goals. Please review the following plan we discussed and let me know if I can assist you in the future.   These are the goals we discussed:  Goals       Patient Stated     Weight goal 200lb (pt-stated)      Walk more for exercise Lose weight Monitor sodium intake         This is a list of the screening recommended for you and due dates:  Health Maintenance  Topic Date Due   Mammogram  12/05/2021   COVID-19 Vaccine (3 - Pfizer risk series) 05/01/2022*   Zoster (Shingles) Vaccine (1 of 2) 05/22/2022*   Flu Shot  06/29/2022*   Cologuard (Stool DNA test)  01/26/2023   Medicare Annual Wellness Visit  02/20/2023   Pneumonia Vaccine (3 - PPSV23 or PCV20) 04/12/2024   DEXA scan (bone density measurement)  Completed   Hepatitis C Screening: USPSTF Recommendation to screen - Ages 38-79 yo.  Completed   HPV Vaccine  Aged Out  *Topic was postponed. The date shown is not the original due date.    Conditions/risks identified: none new  Mammogram ordered- Number provided for scheduling. [phone]. [place].   Next appointment: Follow up in one year for your annual wellness visit    Preventive Care 65 Years and Older, Female Preventive care refers to lifestyle choices and visits with your health care provider that can promote health and wellness. What does preventive care include? A yearly physical exam. This is also called an annual well check. Dental exams once or twice a year. Routine eye exams. Ask your health care provider how often you should have your eyes checked. Personal lifestyle choices, including: Daily care of your teeth and gums. Regular physical activity. Eating a healthy diet. Avoiding tobacco and drug use. Limiting alcohol use. Practicing safe sex. Taking low-dose aspirin every day. Taking vitamin and mineral supplements as  recommended by your health care provider. What happens during an annual well check? The services and screenings done by your health care provider during your annual well check will depend on your age, overall health, lifestyle risk factors, and family history of disease. Counseling  Your health care provider may ask you questions about your: Alcohol use. Tobacco use. Drug use. Emotional well-being. Home and relationship well-being. Sexual activity. Eating habits. History of falls. Memory and ability to understand (cognition). Work and work Statistician. Reproductive health. Screening  You may have the following tests or measurements: Height, weight, and BMI. Blood pressure. Lipid and cholesterol levels. These may be checked every 5 years, or more frequently if you are over 62 years old. Skin check. Lung cancer screening. You may have this screening every year starting at age 44 if you have a 30-pack-year history of smoking and currently smoke or have quit within the past 15 years. Fecal occult blood test (FOBT) of the stool. You may have this test every year starting at age 69. Flexible sigmoidoscopy or colonoscopy. You may have a sigmoidoscopy every 5 years or a colonoscopy every 10 years starting at age 65. Hepatitis C blood test. Hepatitis B blood test. Sexually transmitted disease (STD) testing. Diabetes screening. This is done by checking your blood sugar (glucose) after you have not eaten for a while (fasting). You may have this done every 1-3 years. Bone density scan. This is done to screen for  osteoporosis. You may have this done starting at age 33. Mammogram. This may be done every 1-2 years. Talk to your health care provider about how often you should have regular mammograms. Talk with your health care provider about your test results, treatment options, and if necessary, the need for more tests. Vaccines  Your health care provider may recommend certain vaccines, such  as: Influenza vaccine. This is recommended every year. Tetanus, diphtheria, and acellular pertussis (Tdap, Td) vaccine. You may need a Td booster every 10 years. Zoster vaccine. You may need this after age 54. Pneumococcal 13-valent conjugate (PCV13) vaccine. One dose is recommended after age 76. Pneumococcal polysaccharide (PPSV23) vaccine. One dose is recommended after age 68. Talk to your health care provider about which screenings and vaccines you need and how often you need them. This information is not intended to replace advice given to you by your health care provider. Make sure you discuss any questions you have with your health care provider. Document Released: 04/13/2015 Document Revised: 12/05/2015 Document Reviewed: 01/16/2015 Elsevier Interactive Patient Education  2017 [place] Prevention in the Home Falls can cause injuries. They can happen to people of all ages. There are many things you can do to make your home safe and to help prevent falls. What can I do on the outside of my home? Regularly fix the edges of walkways and driveways and fix any cracks. Remove anything that might make you trip as you walk through a door, such as a raised step or threshold. Trim any bushes or trees on the path to your home. Use bright outdoor lighting. Clear any walking paths of anything that might make someone trip, such as rocks or tools. Regularly check to see if handrails are loose or broken. Make sure that both sides of any steps have handrails. Any raised decks and porches should have guardrails on the edges. Have any leaves, snow, or ice cleared regularly. Use sand or salt on walking paths during winter. Clean up any spills in your garage right away. This includes oil or grease spills. What can I do in the bathroom? Use night lights. Install grab bars by the toilet and in the tub and shower. Do not use towel bars as grab bars. Use non-skid mats or decals in the tub or  shower. If you need to sit down in the shower, use a plastic, non-slip stool. Keep the floor dry. Clean up any water that spills on the floor as soon as it happens. Remove soap buildup in the tub or shower regularly. Attach bath mats securely with double-sided non-slip rug tape. Do not have throw rugs and other things on the floor that can make you trip. What can I do in the bedroom? Use night lights. Make sure that you have a light by your bed that is easy to reach. Do not use any sheets or blankets that are too big for your bed. They should not hang down onto the floor. Have a firm chair that has side arms. You can use this for support while you get dressed. Do not have throw rugs and other things on the floor that can make you trip. What can I do in the kitchen? Clean up any spills right away. Avoid walking on wet floors. Keep items that you use a lot in easy-to-reach places. If you need to reach something above you, use a strong step stool that has a grab bar. Keep electrical cords out of the way. Do not  use floor polish or wax that makes floors slippery. If you must use wax, use non-skid floor wax. Do not have throw rugs and other things on the floor that can make you trip. What can I do with my stairs? Do not leave any items on the stairs. Make sure that there are handrails on both sides of the stairs and use them. Fix handrails that are broken or loose. Make sure that handrails are as long as the stairways. Check any carpeting to make sure that it is firmly attached to the stairs. Fix any carpet that is loose or worn. Avoid having throw rugs at the top or bottom of the stairs. If you do have throw rugs, attach them to the floor with carpet tape. Make sure that you have a light switch at the top of the stairs and the bottom of the stairs. If you do not have them, ask someone to add them for you. What else can I do to help prevent falls? Wear shoes that: Do not have high heels. Have  rubber bottoms. Are comfortable and fit you well. Are closed at the toe. Do not wear sandals. If you use a stepladder: Make sure that it is fully opened. Do not climb a closed stepladder. Make sure that both sides of the stepladder are locked into place. Ask someone to hold it for you, if possible. Clearly mark and make sure that you can see: Any grab bars or handrails. First and last steps. Where the edge of each step is. Use tools that help you move around (mobility aids) if they are needed. These include: Canes. Walkers. Scooters. Crutches. Turn on the lights when you go into a dark area. Replace any light bulbs as soon as they burn out. Set up your furniture so you have a clear path. Avoid moving your furniture around. If any of your floors are uneven, fix them. If there are any pets around you, be aware of where they are. Review your medicines with your doctor. Some medicines can make you feel dizzy. This can increase your chance of falling. Ask your doctor what other things that you can do to help prevent falls. This information is not intended to replace advice given to you by your health care provider. Make sure you discuss any questions you have with your health care provider. Document Released: 01/11/2009 Document Revised: 08/23/2015 Document Reviewed: 04/21/2014 Elsevier Interactive Patient Education  2017 [place]. Opioid Pain Medicine Management Opioids are powerful medicines that are used to treat moderate to severe pain. When used for short periods of time, they can help you to: Sleep better. Do better in physical or occupational therapy. Feel better in the first few days after an injury. Recover from surgery. Opioids should be taken with the supervision of a trained health care provider. They should be taken for the shortest period of time possible. This is because opioids can be addictive, and the longer you take opioids, the greater your risk of addiction. This  addiction can also be called opioid use disorder. What are the risks? Using opioid pain medicines for longer than 3 days increases your risk of side effects. Side effects include: Constipation. Nausea and vomiting. Breathing difficulties (respiratory depression). Drowsiness. Confusion. Opioid use disorder. Itching. Taking opioid pain medicine for a long period of time can affect your ability to do daily tasks. It also puts you at risk for: Motor vehicle crashes. Depression. Suicide. Heart attack. Overdose, which can be life-threatening. What is a pain  treatment plan? A pain treatment plan is an agreement between you and your health care provider. Pain is unique to each person, and treatments vary depending on your condition. To manage your pain, you and your health care provider need to work together. To help you do this: Discuss the goals of your treatment, including how much pain you might expect to have and how you will manage the pain. Review the risks and benefits of taking opioid medicines. Remember that a good treatment plan uses more than one approach and minimizes the chance of side effects. Be honest about the amount of medicines you take and about any drug or alcohol use. Get pain medicine prescriptions from only one health care provider. Pain can be managed with many types of alternative treatments. Ask your health care provider to refer you to one or more specialists who can help you manage pain through: Physical or occupational therapy. Counseling (cognitive behavioral therapy). Good nutrition. Biofeedback. Massage. Meditation. Non-opioid medicine. Following a gentle exercise program. How to use opioid pain medicine Taking medicine Take your pain medicine exactly as told by your health care provider. Take it only when you need it. If your pain gets less severe, you may take less than your prescribed dose if your health care provider approves. If you are not having  pain, do nottake pain medicine unless your health care provider tells you to take it. If your pain is severe, do nottry to treat it yourself by taking more pills than instructed on your prescription. Contact your health care provider for help. Write down the times when you take your pain medicine. It is easy to become confused while on pain medicine. Writing the time can help you avoid overdose. Take other over-the-counter or prescription medicines only as told by your health care provider. Keeping yourself and others safe  While you are taking opioid pain medicine: Do not drive, use machinery, or power tools. Do not sign legal documents. Do not drink alcohol. Do not take sleeping pills. Do not supervise children by yourself. Do not do activities that require climbing or being in high places. Do not go to a lake, river, ocean, spa, or swimming pool. Do not share your pain medicine with anyone. Keep pain medicine in a locked cabinet or in a secure area where pets and children cannot reach it. Stopping your use of opioids If you have been taking opioid medicine for more than a few weeks, you may need to slowly decrease (taper) how much you take until you stop completely. Tapering your use of opioids can decrease your risk of symptoms of withdrawal, such as: Pain and cramping in the abdomen. Nausea. Sweating. Sleepiness. Restlessness. Uncontrollable shaking (tremors). Cravings for the medicine. Do not attempt to taper your use of opioids on your own. Talk with your health care provider about how to do this. Your health care provider may prescribe a step-down schedule based on how much medicine you are taking and how long you have been taking it. Getting rid of leftover pills Do not save any leftover pills. Get rid of leftover pills safely by: Taking the medicine to a prescription take-back program. This is usually offered by the county or law enforcement. Bringing them to a pharmacy that  has a drug disposal container. Flushing them down the toilet. Check the label or package insert of your medicine to see whether this is safe to do. Throwing them out in the trash. Check the label or package insert of your medicine  to see whether this is safe to do. If it is safe to throw it out, remove the medicine from the original container, put it into a sealable bag or container, and mix it with used coffee grounds, food scraps, dirt, or cat litter before putting it in the trash. Follow these instructions at home: Activity Do exercises as told by your health care provider. Avoid activities that make your pain worse. Return to your normal activities as told by your health care provider. Ask your health care provider what activities are safe for you. General instructions You may need to take these actions to prevent or treat constipation: Drink enough fluid to keep your urine pale yellow. Take over-the-counter or prescription medicines. Eat foods that are high in fiber, such as beans, whole grains, and fresh fruits and vegetables. Limit foods that are high in fat and processed sugars, such as fried or sweet foods. Keep all follow-up visits. This is important. Where to find support If you have been taking opioids for a long time, you may benefit from receiving support for quitting from a local support group or counselor. Ask your health care provider for a referral to these resources in your area. Where to find more information Centers for Disease Control and Prevention (CDC): http://www.wolf.info/ [place] Food and Drug Administration (FDA): [place] Get help right away if: You may have taken too much of an opioid (overdosed). Common symptoms of an overdose: Your breathing is slower or more shallow than normal. You have a very slow heartbeat (pulse). You have slurred speech. You have nausea and vomiting. Your pupils become very small. You have other potential symptoms: You are very confused. You  faint or feel like you will faint. You have cold, clammy skin. You have blue lips or fingernails. You have thoughts of harming yourself or harming others. These symptoms may represent a serious problem that is an emergency. Do not wait to see if the symptoms will go away. Get medical help right away. Call your local emergency services (911 in the [place] Do not drive yourself to the hospital.  If you ever feel like you may hurt yourself or others, or have thoughts about taking your own life, get help right away. Go to your nearest emergency department or: Call your local emergency services (911 in the [place] Call the [facility] 979-336-0802 in the [place] Call a suicide crisis helpline, such as the [place] at 430 393 1393 or 988 in the [place]. This is open 24 hours a day in the [place] Text the Crisis Text Line at (920)640-3385 (in the [place].). Summary Opioid medicines can help you manage moderate to severe pain for a short period of time. A pain treatment plan is an agreement between you and your health care provider. Discuss the goals of your treatment, including how much pain you might expect to have and how you will manage the pain. If you think that you or someone else may have taken too much of an opioid, get medical help right away. This information is not intended to replace advice given to you by your health care provider. Make sure you discuss any questions you have with your health care provider. Document Revised: 10/10/2020 Document Reviewed: 06/27/2020 Elsevier Patient Education  [place].

## 2022-02-19 NOTE — Progress Notes (Addendum)
Subjective:   Lauren Bartlett is a 66 y.o. female who presents for Medicare Annual (Subsequent) preventive examination.  Review of Systems    No ROS.  Medicare Wellness Virtual Visit.  Visual/audio telehealth visit, UTA vital signs.   See social history for additional risk factors.   Cardiac Risk Factors include: advanced age (>14mn, >>1women)     Objective:    Today's Vitals   02/19/22 1548  Weight: 249 lb (112.9 kg)  Height: '5\' 2"'$  (1.575 m)   Body mass index is 45.54 kg/m.     02/19/2022    3:50 PM 12/22/2021    2:58 PM 11/28/2021   10:54 AM 06/18/2021   10:24 AM 02/18/2021   12:43 PM 11/26/2020    2:19 PM 03/02/2020   12:58 PM  Advanced Directives  Does Patient Have a Medical Advance Directive? No No No No No No No  Would patient like information on creating a medical advance directive? No - Patient declined    No - Patient declined  No - Patient declined    Current Medications (verified) Outpatient Encounter Medications as of 02/19/2022  Medication Sig   acetaminophen (TYLENOL) 500 MG tablet Take 500 mg by mouth 2 (two) times daily as needed for moderate pain or headache.   albuterol (PROVENTIL) (2.5 MG/3ML) 0.083% nebulizer solution INHALE THE CONTENTS OF 1 VIAL VIA NEBULIZER EVERY 6 HOURS AS NEEDED FOR WHEEZING  OR FOR SHORTNESS OF BREATH   albuterol (VENTOLIN HFA) 108 (90 Base) MCG/ACT inhaler INHALE 2 INHALATIONS BY  MOUTH INTO THE LUNGS EVERY  6 HOURS AS NEEDED FOR  WHEEZING OR SHORTNESS OF  BREATH (USE WITH SPACER)   aspirin EC 81 MG EC tablet Take 1 tablet (81 mg total) by mouth daily.   benzonatate (TESSALON) 200 MG capsule Take 1 capsule (200 mg total) by mouth 3 (three) times daily as needed for cough.   calcitRIOL (ROCALTROL) 0.25 MCG capsule TAKE 1 CAPSULE BY MOUTH DAILY   Calcium Carb-Cholecalciferol 600-800 MG-UNIT TABS Take 1 tablet by mouth in the morning, at noon, and at bedtime.    docusate sodium (COLACE) 100 MG capsule Take 100 mg by mouth daily.    famotidine (PEPCID AC) 10 MG chewable tablet Chew 20 mg by mouth daily as needed for heartburn.    ferrous sulfate 325 (65 FE) MG tablet Take 325 mg by mouth daily with breakfast.   Fluticasone-Umeclidin-Vilant (TRELEGY ELLIPTA) 100-62.5-25 MCG/ACT AEPB Inhale 1 puff into the lungs daily.   furosemide (LASIX) 20 MG tablet TAKE 1 TABLET BY MOUTH DAILY MAY TAKE AN ADDITIONAL TABLET AS  NEEDED FOR WEIGHT GAIN OF 2 LBS  OVERNIGHT OR 5 LBS IN 1 WEEK   levothyroxine (SYNTHROID) 150 MCG tablet Take 1 tablet (150 mcg total) by mouth daily before breakfast.   lidocaine (LIDODERM) 5 % Place 1 patch onto the skin every 12 (twelve) hours. Remove & Discard patch within 12 hours or as directed by MD   loratadine (CLARITIN) 10 MG tablet Take 10 mg by mouth daily.   losartan (COZAAR) 50 MG tablet Take 1 tablet (50 mg total) by mouth daily.   OXYGEN Inhale 2 L into the lungs at bedtime as needed (at bedtime and as needed).   oxymetazoline (AFRIN) 0.05 % nasal spray Place 1 spray into both nostrils 2 (two) times daily. As needed for nose bleed   polyethylene glycol powder (GLYCOLAX/MIRALAX) powder Take 17 g by mouth 2 (two) times daily as needed.   traMADol (Veatrice Bourbon  50 MG tablet Take 1-2 tablets (50-100 mg total) by mouth every 4 (four) hours as needed for moderate pain.   vitamin B-12 (CYANOCOBALAMIN) 1000 MCG tablet Take 1 tablet (1,000 mcg total) by mouth daily.   Vitamin D, Ergocalciferol, (DRISDOL) 1.25 MG (50000 UNIT) CAPS capsule TAKE 1 CAPSULE BY MOUTH 1 TIME A WEEK   Facility-Administered Encounter Medications as of 02/19/2022  Medication   albuterol (PROVENTIL) (2.5 MG/3ML) 0.083% nebulizer solution 2.5 mg    Allergies (verified) Codeine, Levaquin [levofloxacin], Penicillins, Zithromax [azithromycin], and Prednisone   History: Past Medical History:  Diagnosis Date   Anxiety    Bleeding from the nose    Chronic diastolic congestive heart failure ([place])    a. 03/2016 Echo: EF 55-60%, Gr2 DD; b.  08/2016 Echo: EF 60-65%, nl diast fxn; c. 01/2019 Echo: EF 55-60%. Nl RV fxn.   Congenital absence of one kidney    COPD (chronic obstructive pulmonary disease) ([place])    a. 02/2017 PFT: mod/sev obstructive airway dzs w/ significant bronchodilator response.   Depression    [facility]   Dyspnea    Emphysema/COPD [facility])    GERD (gastroesophageal reflux disease)    History of cardiac cath    a. 05/2016 Cath: nl cors.   History of sciatica    Hypertension    Hypothyroidism    Mitral Valve Prolapse & Severe mitral regurgitation s/p MVR    a. 03/2016 Echo: severe MVP involving the posterior leaflet, sev MR;  b. 06/2016 min invasive MV repair w/ triangular resection of flail segment (P2) of posterior leaflet, gore-tex neochord placement x 4, Sorin Memo 3D rin Annulosplasty (75m, catalog # [id number] ser # [id number]; c. 08/2016 Echo: MV area 2.06 cm^2 (pressure 1/2 time); d. 01/2019 Echo: Mild MS w/ mean grad 824mg.   Motion sickness    cars   Pneumonia    PONV (postoperative nausea and vomiting)    Post-op Afib    a. 06/2016 following MV repair-->short-course amio. Coumadin d/c'd 2/2 epistaxis.   [place]   a. 08/2016 Echo: PASP 5485m; b. 01/2019 Echo: RVSP 45.3mm55m   S/P patent foramen ovale closure    a. 06/2016 @ time of MV Repair.   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   Past Surgical History:  Procedure Laterality Date   ABDOMINAL HYSTERECTOMY     BREAST BIOPSY Right 2011   [facility]< benign   CATARACT EXTRACTION W/PHACO Left 11/14/2015   Procedure: CATARACT EXTRACTION PHACO AND INTRAOCULAR LENS PLACEMENT (IOC);  Surgeon: ChadLeandrew Koyanagi;  [place];  Laterality: Left;  sleep apnea Tor[place]   CATARACT EXTRACTION W/PHACO Right 12/19/2015   Procedure: CATARACT EXTRACTION PHACO AND INTRAOCULAR LENS PLACEMENT (IOC);  Surgeon: ChadLeandrew Koyanagi;  [place]  [place];  Laterality: Right;  ANXIETY GENEROUS IV SEDATION TORIC LEN   COMBINED HYSTERECTOMY ABDOMINAL W/ A&P REPAIR / OOPHORECTOMY  1996   benign tumor   INNER EAR SURGERY     bilateral   MITRAL VALVE REPAIR Right 07/17/2016   Procedure: MINIMALLY INVASIVE MITRAL VALVE REPAIR (MVR) USING 26 SORIN MEMO 3D ANNULOPLASTY RING;  Surgeon: ClarRexene Alberts;  [place] [facility] [place] Open [place] Surgery;  Laterality: Right;   PATENT FORAMEN OVALE(PFO) CLOSURE N/A 07/17/2016   Procedure: PATENT FORAMEN OVALE (PFO) CLOSURE;  Surgeon: ClarRexene Alberts;  [place] [facility] [place] Open Heart Surgery;  Laterality: N/A;   RIGHT/LEFT HEART CATH AND CORONARY ANGIOGRAPHY Bilateral 06/16/2016   Procedure: Right/Left Heart Cath and Coronary Angiography;  Surgeon: Wellington Hampshire, MD;  Location[place] CV LAB;  Service: Cardiovascular;  Laterality: Bilateral;   TEE WITHOUT CARDIOVERSION N/A 05/19/2016   Procedure: TRANSESOPHAGEAL ECHOCARDIOGRAM (TEE);  Surgeon: Wellington Hampshire, MD;  Location: [facility] ORS;  Service: Cardiovascular;  Laterality: N/A;   TEE WITHOUT CARDIOVERSION N/A 06/09/2016   Procedure: TRANSESOPHAGEAL ECHOCARDIOGRAM (TEE);  Surgeon: Wellington Hampshire, MD;  Location: [facility] ORS;  Service: Cardiovascular;  Laterality: N/A;   TEE WITHOUT CARDIOVERSION N/A 06/16/2016   Procedure: TRANSESOPHAGEAL ECHOCARDIOGRAM (TEE);  Surgeon: Wellington Hampshire, MD;  Location: [facility] ORS;  Service: Cardiovascular;  Laterality: N/A;   TEE WITHOUT CARDIOVERSION N/A 07/17/2016   Procedure: TRANSESOPHAGEAL ECHOCARDIOGRAM (TEE);  Surgeon: Rexene Alberts, MD;  Location: [place];  Service: Open Heart Surgery;  Laterality: N/A;   TONSILLECTOMY     Family History  Problem Relation Age of Onset   Multiple sclerosis Mother    Hypertension Mother    Coronary artery disease Father    Heart disease Father    Hypertension Father    Heart disease Brother    Cancer Brother    Breast cancer Paternal Aunt    Social  History   Socioeconomic History   Marital status: Single    Spouse name: Not on file   Number of children: Not on file   Years of education: Not on file   Highest education level: Not on file  Occupational History   Occupation: cna    Comment: part time  Tobacco Use   Smoking status: Never   Smokeless tobacco: Never  Vaping Use   Vaping Use: Never used  Substance and Sexual Activity   Alcohol use: No   Drug use: No   Sexual activity: Not Currently  Other Topics Concern   Not on file  Social History Narrative   Lives in a group home for adults         Social Determinants of Health   Financial Resource Strain: Low Risk  (02/19/2022)   Overall Financial Resource Strain (CARDIA)    Difficulty of Paying Living Expenses: Not hard at all  Food Insecurity: No Food Insecurity (02/19/2022)   Hunger Vital Sign    Worried About Running Out of Food in the Last Year: Never true    Ran Out of Food in the Last Year: Never true  Transportation Needs: No Transportation Needs (02/19/2022)   PRAPARE - Hydrologist (Medical): No    Lack of Transportation (Non-Medical): No  Physical Activity: Insufficiently Active (02/19/2022)   Exercise Vital Sign    Days of Exercise per Week: 7 days    Minutes of Exercise per Session: 20 min  Stress: No Stress Concern Present (02/19/2022)   [place]    Feeling of Stress : Not at all  Social Connections: Unknown (02/19/2022)   Social Connection and Isolation Panel [NHANES]    Frequency of Communication with Friends and Family: More than three times a week    Frequency of Social Gatherings with Friends and Family: More than three times a week    Attends Religious Services: More than 4 times per year    Active Member of [employer] or Organizations: Not on file    Attends Archivist Meetings: Not on file    Marital Status: Never married  Tobacco  Counseling Counseling given: Not Answered   Clinical Intake:  Pre-visit preparation completed: Yes        Diabetes: No  How often do you need to have someone help you when you read instructions, pamphlets, or other written materials from your doctor or pharmacy?: 1 - Never    Interpreter Needed?: No      Activities of Daily Living    02/19/2022    3:52 PM  In your present state of health, do you have any difficulty performing the following activities:  Hearing? 1  Vision? 0  Difficulty concentrating or making decisions? 0  Walking or climbing stairs? 1  Dressing or bathing? 0  Doing errands, shopping? 0  Preparing Food and eating ? N  Using the Toilet? N  In the past six months, have you accidently leaked urine? N  Do you have problems with loss of bowel control? N  Managing your Medications? N  Managing your Finances? N  Housekeeping or managing your Housekeeping? N    Patient Care Team: [name], MD as PCP - General (Internal Medicine) Wellington Hampshire, MD as PCP - Cardiology (Cardiology)  Indicate any recent Medical Services you may have received from other than [facility] providers in the past year (date may be approximate).     Assessment:   This is a routine wellness examination for Lauren Bartlett.  I connected with  Lauren Bartlett on 02/19/22 by a audio enabled telemedicine application and verified that I am speaking with the correct person using two identifiers.  Patient Location: Home  Provider Location: Office/Clinic  I discussed the limitations of evaluation and management by telemedicine. The patient expressed understanding and agreed to proceed.   Hearing/Vision screen Hearing Screening - Comments:: Hearing aid, bilateral  Vision Screening - Comments::  Followed by [facility] Wears corrective lenses when reading Cataract extraction, bilateral They have regular follow up with the ophthalmologist    Dietary issues and exercise  activities discussed: Current Exercise Habits: Home exercise routine, Type of exercise: walking, Time (Minutes): 20, Frequency (Times/Week): 7, Weekly Exercise (Minutes/Week): 140, Intensity: Mild Low sodium diet    Goals Addressed               This Visit's Progress     Patient Stated     Weight goal 200lb (pt-stated)   On track     Walk more for exercise Lose weight Monitor sodium intake        Depression Screen    02/19/2022    3:49 PM 08/28/2021    9:55 AM 07/18/2021   12:34 PM 02/18/2021   12:40 PM 12/20/2020   10:35 AM 09/14/2020   10:36 AM 02/16/2020   12:33 PM  PHQ 2/9 Scores  PHQ - 2 Score 0 0 0 0 0 0 0  PHQ- 9 Score      1     Fall Risk    02/19/2022    3:51 PM 08/28/2021    9:55 AM 07/18/2021   12:34 PM 02/18/2021   12:43 PM 12/20/2020   10:35 AM  Fall Risk   Falls in the past year? 0 0 0 0 0  Number falls in past yr: 0   0   Injury with Fall? 0      Risk for fall due to : Impaired balance/gait No Fall Risks No Fall Risks    Risk for fall due to: Comment Cane/walker in use      Follow up Falls  evaluation completed;Falls prevention discussed;Education provided Falls evaluation completed Falls evaluation completed Falls evaluation completed Falls evaluation completed    FALL RISK PREVENTION PERTAINING TO THE HOME: Home free of loose throw rugs in walkways, pet beds, electrical cords, etc? Yes  Adequate lighting in your home to reduce risk of falls? Yes   ASSISTIVE DEVICES UTILIZED TO PREVENT FALLS: Life alert? Yes  Use of a cane, walker or w/c? Yes   TIMED UP AND GO: Was the test performed? No .   Cognitive Function:    02/10/2017   11:43 AM 02/11/2016   10:30 AM  MMSE - Mini Mental State Exam  Orientation to time 5 5  Orientation to Place 5 5  Registration 3 3  Attention/ Calculation 5 5  Recall 3 3  Language- name 2 objects 2 2  Language- repeat 1 1  Language- follow 3 step command 3 3  Language- read & follow direction 1 1  Write a  sentence 1 1  Copy design 1 1  Total score 30 30        02/19/2022    3:52 PM 02/18/2021   12:47 PM 02/16/2020   12:38 PM 02/15/2019   12:19 PM 02/11/2018   11:34 AM  6CIT Screen  What Year? 0 points 0 points 0 points 0 points 0 points  What month? 0 points 0 points 0 points 0 points 0 points  What time? 0 points 0 points 0 points 0 points 0 points  Count back from 20 0 points 2 points 0 points 0 points 0 points  Months in reverse 0 points 0 points 0 points 0 points 0 points  Repeat phrase 0 points 0 points 0 points 0 points   Total Score 0 points 2 points 0 points 0 points     Immunizations Immunization History  Administered Date(s) Administered   Fluad Quad(high Dose 65+) 12/20/2020   Influenza Split 01/02/2012   Influenza,inj,Quad PF,6+ Mos 03/13/2014, 01/08/2015, 02/03/2018, 01/26/2019, 02/02/2020   Influenza-Unspecified 01/11/2013, 12/30/2014   PFIZER(Purple Top)SARS-COV-2 Vaccination 08/12/2019, 09/07/2019   Pneumococcal Conjugate-13 02/27/2015   Pneumococcal Polysaccharide-23 03/07/2013, 04/13/2019   Tdap 11/12/2009   Shingrix Completed?: No.    Education has been provided regarding the importance of this vaccine. Patient has been advised to call insurance company to determine out of pocket expense if they have not yet received this vaccine. Advised may also receive vaccine at local pharmacy or Health Dept. Verbalized acceptance and understanding.  Screening Tests Health Maintenance  Topic Date Due   MAMMOGRAM  12/05/2021   COVID-19 Vaccine (3 - Pfizer risk series) 05/01/2022 (Originally 10/05/2019)   Zoster Vaccines- Shingrix (1 of 2) 05/22/2022 (Originally 12/05/1974)   INFLUENZA VACCINE  06/29/2022 (Originally 10/29/2021)   Fecal DNA (Cologuard)  01/26/2023   Medicare Annual Wellness (AWV)  02/20/2023   Pneumonia Vaccine 65+ Years old (3 - PPSV23 or PCV20) 04/12/2024   DEXA SCAN  Completed   Hepatitis C Screening  Completed   HPV VACCINES  Aged Out    Health  Maintenance  Health Maintenance Due  Topic Date Due   MAMMOGRAM  12/05/2021   Mammogram ordered- [phone] for scheduling.   Lung Cancer Screening: (Low Dose CT Chest recommended if Age 42-80 years, 30 pack-year currently smoking OR have quit w/in 15years.) does not qualify.   Hepatitis C Screening: Completed 04/05/21.  Vision Screening: Recommended annual ophthalmology exams for early detection of glaucoma and other disorders of the eye.  Dental Screening: Recommended annual dental exams for proper  oral hygiene  Community Resource Referral / Chronic Care Management: CRR required this visit?  No   CCM required this visit?  No      Plan:     I have personally reviewed and noted the following in the patient's chart:   Medical and social history Use of alcohol, tobacco or illicit drugs  Current medications and supplements including opioid prescriptions. Patient is currently taking opioid prescriptions. Information provided to patient regarding non-opioid alternatives. Patient advised to discuss non-opioid treatment plan with their provider. Taking Tramadol. Followed by PCP.  Functional ability and status Nutritional status Physical activity Advanced directives List of other physicians Hospitalizations, surgeries, and ER visits in previous 12 months Vitals Screenings to include cognitive, depression, and falls Referrals and appointments  In addition, I have reviewed and discussed with patient certain preventive protocols, quality metrics, and best practice recommendations. A written personalized care plan for preventive services as well as general preventive health recommendations were provided to patient.     [place], LPN   [id number]     I have reviewed the above information and agree with above.   Deborra Medina, MD

## 2022-02-25 ENCOUNTER — Ambulatory Visit: Payer: Medicare Other | Admitting: Pulmonary Disease

## 2022-03-04 ENCOUNTER — Ambulatory Visit (INDEPENDENT_AMBULATORY_CARE_PROVIDER_SITE_OTHER): Payer: Medicare Other

## 2022-03-04 ENCOUNTER — Encounter: Payer: Self-pay | Admitting: Internal Medicine

## 2022-03-04 ENCOUNTER — Ambulatory Visit (INDEPENDENT_AMBULATORY_CARE_PROVIDER_SITE_OTHER): Payer: Medicare Other | Admitting: Internal Medicine

## 2022-03-04 VITALS — BP 110/70 | HR 89 | Temp 97.6°F | Ht 62.0 in | Wt 252.2 lb

## 2022-03-04 DIAGNOSIS — J42 Unspecified chronic bronchitis: Secondary | ICD-10-CM | POA: Diagnosis not present

## 2022-03-04 DIAGNOSIS — Z79899 Other long term (current) drug therapy: Secondary | ICD-10-CM | POA: Diagnosis not present

## 2022-03-04 DIAGNOSIS — J9811 Atelectasis: Secondary | ICD-10-CM | POA: Diagnosis not present

## 2022-03-04 DIAGNOSIS — E034 Atrophy of thyroid (acquired): Secondary | ICD-10-CM

## 2022-03-04 DIAGNOSIS — J441 Chronic obstructive pulmonary disease with (acute) exacerbation: Secondary | ICD-10-CM

## 2022-03-04 DIAGNOSIS — R062 Wheezing: Secondary | ICD-10-CM | POA: Diagnosis not present

## 2022-03-04 DIAGNOSIS — I10 Essential (primary) hypertension: Secondary | ICD-10-CM | POA: Diagnosis not present

## 2022-03-04 DIAGNOSIS — R059 Cough, unspecified: Secondary | ICD-10-CM | POA: Diagnosis not present

## 2022-03-04 DIAGNOSIS — R7301 Impaired fasting glucose: Secondary | ICD-10-CM

## 2022-03-04 DIAGNOSIS — E785 Hyperlipidemia, unspecified: Secondary | ICD-10-CM

## 2022-03-04 LAB — COMPREHENSIVE METABOLIC PANEL
ALT: 16 U/L (ref 0–35)
AST: 17 U/L (ref 0–37)
Albumin: 4.4 g/dL (ref 3.5–5.2)
Alkaline Phosphatase: 72 U/L (ref 39–117)
BUN: 15 mg/dL (ref 6–23)
CO2: 33 mEq/L — ABNORMAL HIGH (ref 19–32)
Calcium: 8.3 mg/dL — ABNORMAL LOW (ref 8.4–10.5)
Chloride: 99 mEq/L (ref 96–112)
Creatinine, Ser: 0.83 mg/dL (ref 0.40–1.20)
GFR: 73.55 mL/min (ref >60.00)
Glucose, Bld: 83 mg/dL (ref 70–99)
Potassium: 4 mEq/L (ref 3.5–5.1)
Sodium: 141 mEq/L (ref 135–145)
Total Bilirubin: 0.7 mg/dL (ref 0.2–1.2)
Total Protein: 7.1 g/dL (ref 6.0–8.3)

## 2022-03-04 LAB — CBC WITH DIFFERENTIAL/PLATELET
Basophils Absolute: 0 10*3/uL (ref 0.0–0.1)
Basophils Relative: 0.8 % (ref 0.0–3.0)
Eosinophils Absolute: 0.3 10*3/uL (ref 0.0–0.7)
Eosinophils Relative: 4.4 % (ref 0.0–5.0)
HCT: 43.7 % (ref 36.0–46.0)
Hemoglobin: 14.4 g/dL (ref 12.0–15.0)
Lymphocytes Relative: 15 % (ref 12.0–46.0)
Lymphs Abs: 0.9 10*3/uL (ref 0.7–4.0)
MCHC: 33.1 g/dL (ref 30.0–36.0)
MCV: 94.9 fl (ref 78.0–100.0)
Monocytes Absolute: 0.5 10*3/uL (ref 0.1–1.0)
Monocytes Relative: 8 % (ref 3.0–12.0)
Neutro Abs: 4.3 10*3/uL (ref 1.4–7.7)
Neutrophils Relative %: 71.8 % (ref 43.0–77.0)
Platelets: 174 10*3/uL (ref 150.0–400.0)
RBC: 4.6 Mil/uL (ref 3.87–5.11)
RDW: 13.8 % (ref 11.5–15.5)
WBC: 6 10*3/uL (ref 4.0–10.5)

## 2022-03-04 LAB — LDL CHOLESTEROL, DIRECT: Direct LDL: 101 mg/dL

## 2022-03-04 LAB — LIPID PANEL
Cholesterol: 204 mg/dL — ABNORMAL HIGH (ref 0–200)
HDL: 77.4 mg/dL (ref >39.00)
LDL Cholesterol: 108 mg/dL — ABNORMAL HIGH (ref 0–99)
NonHDL: 126.82
Total CHOL/HDL Ratio: 3
Triglycerides: 94 mg/dL (ref 0.0–149.0)
VLDL: 18.8 mg/dL (ref 0.0–40.0)

## 2022-03-04 LAB — MICROALBUMIN / CREATININE URINE RATIO
Creatinine,U: 56.8 mg/dL
Microalb Creat Ratio: 1.2 mg/g (ref 0.0–30.0)
Microalb, Ur: <0.7 mg/dL (ref 0.0–1.9)

## 2022-03-04 LAB — TSH: TSH: 0.36 u[IU]/mL (ref 0.35–5.50)

## 2022-03-04 LAB — HEMOGLOBIN A1C: Hgb A1c MFr Bld: 5.5 % (ref 4.6–6.5)

## 2022-03-04 MED ORDER — ALBUTEROL SULFATE HFA 108 (90 BASE) MCG/ACT IN AERS: INHALATION_SPRAY | RESPIRATORY_TRACT | 6 refills | Status: DC

## 2022-03-04 MED ORDER — DOXYCYCLINE HYCLATE 100 MG PO TABS: 100.0000 mg | ORAL_TABLET | Freq: Two times a day (BID) | ORAL | 0 refills | Status: DC

## 2022-03-04 MED ORDER — METHYLPREDNISOLONE ACETATE 40 MG/ML IJ SUSP
40.0000 mg | Freq: Once | INTRAMUSCULAR | Status: AC
  Administered 2022-03-04: 40 mg via INTRAMUSCULAR

## 2022-03-04 NOTE — Patient Instructions (Addendum)
1) YOUR BONE DENSITY IS NORMAL   WE WILL REPEAT YOUR TEST IN 5 YEARS   2) I am treating you for  bronchitis  please do the following   A)  increase   your water intake to 3 bottles of water daily  TO HELP THIN OUT THE MUCUS   B) Prednisone shot today (depo medrol 40 mg given)  C) I have refilled the albuterol MDI through mail order.  Use the nebulized albuterol every 6 hours  until you receive it   D) USE AYR FIRST TO MOISTURIZE NOSE,  CONTINUE ALL WINTER LONG   E) Use afrin  nasal spray AFTER THE AYR  SPRAY,  2 times daily for 5 days,  then STOP    F) Doxycycline 100 mg twice daily for 7 days (ANTIBIOTIC SENT TO [facility])

## 2022-03-04 NOTE — Assessment & Plan Note (Signed)
encouraged to start walking daily and limit her sweets

## 2022-03-04 NOTE — Assessment & Plan Note (Signed)
Treating current mild exacerbation with Depo Medrol 40 mg IM dose,  doxycycline.  Continue use of albuterol nebs. Chest x ray pending

## 2022-03-04 NOTE — Progress Notes (Signed)
Subjective:  Patient ID: Lauren Bartlett, female    DOB: [birth year]  Age: 66 y.o. MRN: [id number]  CC: The primary encounter diagnosis was Primary hypertension. Diagnoses of Hypothyroidism due to acquired atrophy of thyroid, Hyperlipidemia, unspecified hyperlipidemia type, Impaired fasting glucose, Long-term use of high-risk medication, COPD with acute exacerbation ([place]), Chronic bronchitis, unspecified chronic bronchitis type ([place]), and Morbid obesity ([place]) were also pertinent to this visit.   HPI Lauren Bartlett presents for  Chief Complaint  Patient presents with   Follow-up    4 month follow up     1)  she has been dealing with a URI for the last 2 days .  Some wheezing,  sinus pressure and productive cough .  Some wheezing   2) weight gain despite walking daily.  Eating too much holiday food. Has a sweet tooth   3) edema . Using furosemide 20 mg daily amd weighing daily  no recent overnight changes in weight.     Outpatient Medications Prior to Visit  Medication Sig Dispense Refill   acetaminophen (TYLENOL) 500 MG tablet Take 500 mg by mouth 2 (two) times daily as needed for moderate pain or headache.     albuterol (PROVENTIL) (2.5 MG/3ML) 0.083% nebulizer solution INHALE THE CONTENTS OF 1 VIAL VIA NEBULIZER EVERY 6 HOURS AS NEEDED FOR WHEEZING  OR FOR SHORTNESS OF BREATH 180 mL 1   aspirin EC 81 MG EC tablet Take 1 tablet (81 mg total) by mouth daily.     benzonatate (TESSALON) 200 MG capsule Take 1 capsule (200 mg total) by mouth 3 (three) times daily as needed for cough. 20 capsule 0   calcitRIOL (ROCALTROL) 0.25 MCG capsule TAKE 1 CAPSULE BY MOUTH DAILY 100 capsule 2   Calcium Carb-Cholecalciferol 600-800 MG-UNIT TABS Take 1 tablet by mouth in the morning, at noon, and at bedtime.      docusate sodium (COLACE) 100 MG capsule Take 100 mg by mouth daily.     famotidine (PEPCID AC) 10 MG chewable tablet Chew 20 mg by mouth daily as needed for heartburn.      ferrous sulfate 325 (65  FE) MG tablet Take 325 mg by mouth daily with breakfast.     Fluticasone-Umeclidin-Vilant (TRELEGY ELLIPTA) 100-62.5-25 MCG/ACT AEPB Inhale 1 puff into the lungs daily. 3 each 3   furosemide (LASIX) 20 MG tablet TAKE 1 TABLET BY MOUTH DAILY MAY TAKE AN ADDITIONAL TABLET AS  NEEDED FOR WEIGHT GAIN OF 2 LBS  OVERNIGHT OR 5 LBS IN 1 WEEK 180 tablet 3   levothyroxine (SYNTHROID) 150 MCG tablet Take 1 tablet (150 mcg total) by mouth daily before breakfast. 90 tablet 3   lidocaine (LIDODERM) 5 % Place 1 patch onto the skin every 12 (twelve) hours. Remove & Discard patch within 12 hours or as directed by MD 15 patch 0   loratadine (CLARITIN) 10 MG tablet Take 10 mg by mouth daily.     losartan (COZAAR) 50 MG tablet Take 1 tablet (50 mg total) by mouth daily. 90 tablet 3   OXYGEN Inhale 2 L into the lungs at bedtime as needed (at bedtime and as needed).     oxymetazoline (AFRIN) 0.05 % nasal spray Place 1 spray into both nostrils 2 (two) times daily. As needed for nose bleed     polyethylene glycol powder (GLYCOLAX/MIRALAX) powder Take 17 g by mouth 2 (two) times daily as needed. 3350 g 1   traMADol (ULTRAM) 50 MG tablet Take 1-2 tablets (50-100  mg total) by mouth every 4 (four) hours as needed for moderate pain. 30 tablet 0   vitamin B-12 (CYANOCOBALAMIN) 1000 MCG tablet Take 1 tablet (1,000 mcg total) by mouth daily. 90 tablet 3   Vitamin D, Ergocalciferol, (DRISDOL) 1.25 MG (50000 UNIT) CAPS capsule TAKE 1 CAPSULE BY MOUTH 1 TIME A WEEK 12 capsule 3   albuterol (VENTOLIN HFA) 108 (90 Base) MCG/ACT inhaler INHALE 2 INHALATIONS BY  MOUTH INTO THE LUNGS EVERY  6 HOURS AS NEEDED FOR  WHEEZING OR SHORTNESS OF  BREATH (USE WITH SPACER) 36 g 6   Facility-Administered Medications Prior to Visit  Medication Dose Route Frequency Provider Last Rate Last Admin   albuterol (PROVENTIL) (2.5 MG/3ML) 0.083% nebulizer solution 2.5 mg  2.5 mg Nebulization Once Tullo, Teresa L, MD        Review of Systems;  Patient  denies headache, fevers, malaise, unintentional weight loss, skin rash, eye pain, sinus congestion and sinus pain, sore throat, dysphagia,  hemoptysis , c chest pain, palpitations, rthopnea, edema, abdominal pain, nausea, melena, diarrhea, constipation, flank pain, dysuria, hematuria, urinary  Frequency, nocturia, numbness, tingling, seizures,  Focal weakness, Loss of consciousness,  Tremor, insomnia, depression, anxiety, and suicidal ideation.      Objective:  BP 110/70   Pulse 89   Temp 97.6 F (36.4 C) (Oral)   Ht 5' 2" (1.575 m)   Wt 252 lb 3.2 oz (114.4 kg)   SpO2 93%   BMI 46.13 kg/m   BP Readings from Last 3 Encounters:  03/04/22 110/70  02/10/22 128/83  12/22/21 124/84    Wt Readings from Last 3 Encounters:  03/04/22 252 lb 3.2 oz (114.4 kg)  02/19/22 249 lb (112.9 kg)  02/10/22 249 lb (112.9 kg)    General appearance: alert, cooperative and appears stated age Ears: normal TM's and external ear canals both ears Throat: lips, mucosa, and tongue normal; teeth and gums normal Neck: no adenopathy, no carotid bruit, supple, symmetrical, trachea midline and thyroid not enlarged, symmetric, no tenderness/mass/nodules Back: symmetric, no curvature. ROM normal. No CVA tenderness. Lungs: bilateral end expiratory wheezing , no ronchi or egophony Heart: regular rate and rhythm, S1, S2 normal, no murmur, click, rub or gallop Abdomen: soft, non-tender; bowel sounds normal; no masses,  no organomegaly Pulses: 2+ and symmetric Skin: Skin color, texture, turgor normal. No rashes or lesions Lymph nodes: Cervical, supraclavicular, and axillary nodes normal. Neuro:  awake and interactive with normal mood and affect. Higher cortical functions are normal. Speech is clear without word-finding difficulty or dysarthria. Extraocular movements are intact. Visual fields of both eyes are grossly intact. Sensation to light touch is grossly intact bilaterally of upper and lower extremities. Motor  examination shows 4+/5 symmetric hand grip and upper extremity and 5/5 lower extremity strength. There is no pronation or drift. Gait is non-ataxic   Lab Results  Component Value Date   HGBA1C 5.6 12/20/2020   HGBA1C 5.5 01/26/2019   HGBA1C 5.3 07/30/2017    Lab Results  Component Value Date   CREATININE 0.94 11/28/2021   CREATININE 0.88 06/18/2021   CREATININE 0.97 05/27/2021    Lab Results  Component Value Date   WBC 6.8 11/28/2021   HGB 14.7 11/28/2021   HCT 47.7 (H) 11/28/2021   PLT 161 11/28/2021   GLUCOSE 102 (H) 11/28/2021   CHOL 192 04/29/2021   TRIG 92.0 04/29/2021   HDL 59.90 04/29/2021   LDLDIRECT 101.0 07/30/2017   LDLCALC 114 (H) 04/29/2021   ALT 19 06/18/2021     AST 21 06/18/2021   NA 142 11/28/2021   K 4.0 11/28/2021   CL 103 11/28/2021   CREATININE 0.94 11/28/2021   BUN 15 11/28/2021   CO2 29 11/28/2021   TSH 0.65 04/29/2021   INR 1.1 (H) 06/13/2020   HGBA1C 5.6 12/20/2020    DG Chest 2 View  Result Date: 12/22/2021 CLINICAL DATA:  Shortness of breath EXAM: CHEST - 2 VIEW COMPARISON:  11/28/2021 chest x-ray and chest CT, radiograph 11/26/2020 FINDINGS: Chronic elevation of the right diaphragm with atelectasis or scar at the right base. No acute consolidation, pleural effusion, or pneumothorax. Stable cardiomediastinal silhouette. Valve prosthesis. Aortic atherosclerosis. IMPRESSION: No active cardiopulmonary disease. Chronic elevation of right diaphragm with atelectasis or scar at the right base Electronically Signed   By: Kim  Fujinaga M.D.   On: 12/22/2021 15:11    Assessment & Plan:   Problem List Items Addressed This Visit     COPD (chronic obstructive pulmonary disease) (HCC) (Chronic)    Treating current mild exacerbation with Depo Medrol 40 mg IM dose,  doxycycline.  Continue use of albuterol nebs. Chest x ray pending       Relevant Medications   albuterol (VENTOLIN HFA) 108 (90 Base) MCG/ACT inhaler   Hypertension - Primary (Chronic)     Well controlled on current regimen of losartan 50 mg daily. Renal function stable, no changes today.      Relevant Orders   Comp Met (CMET)   Microalbumin / creatinine urine ratio   Hypothyroidism (Chronic)   Relevant Orders   TSH   Morbid obesity (HCC)    encouraged to start walking daily and limit her sweets        Other Visit Diagnoses     Hyperlipidemia, unspecified hyperlipidemia type       Relevant Orders   Lipid Profile   Direct LDL   Impaired fasting glucose       Relevant Orders   HgB A1c   Long-term use of high-risk medication       Relevant Orders   CBC with Differential/Platelet   COPD with acute exacerbation (HCC)       Relevant Medications   albuterol (VENTOLIN HFA) 108 (90 Base) MCG/ACT inhaler   methylPREDNISolone acetate (DEPO-MEDROL) injection 40 mg (Completed)   Other Relevant Orders   DG Chest 2 View       I spent a total of   minutes with this patient in a face to face visit on the date of this encounter reviewing the last office visit with me in       ,  most recent visit with cardiology ,    ,  patient's diet and exercise habits, home blood pressure /blod sugar readings, recent ER visit including labs and imaging studies ,   and post visit ordering of testing and therapeutics.    Follow-up: No follow-ups on file.   Teresa L Tullo, MD 

## 2022-03-04 NOTE — Assessment & Plan Note (Signed)
Well controlled on current regimen of losartan 50 mg daily. Renal function stable, no changes today.

## 2022-03-07 DIAGNOSIS — J449 Chronic obstructive pulmonary disease, unspecified: Secondary | ICD-10-CM | POA: Diagnosis not present

## 2022-03-11 NOTE — Telephone Encounter (Signed)
Error

## 2022-03-14 ENCOUNTER — Ambulatory Visit
Admission: EM | Admit: 2022-03-14 | Discharge: 2022-03-14 | Disposition: A | Discharge status: Home / Self Care | Payer: Medicare Other | Attending: Nurse Practitioner | Admitting: Nurse Practitioner

## 2022-03-14 DIAGNOSIS — J441 Chronic obstructive pulmonary disease with (acute) exacerbation: Secondary | ICD-10-CM

## 2022-03-14 DIAGNOSIS — R062 Wheezing: Secondary | ICD-10-CM

## 2022-03-14 MED ORDER — METHYLPREDNISOLONE ACETATE 40 MG/ML IJ SUSP
40.0000 mg | Freq: Once | INTRAMUSCULAR | Status: AC
  Administered 2022-03-14: 40 mg via INTRAMUSCULAR

## 2022-03-14 NOTE — ED Provider Notes (Signed)
[facility]    CSN: [id number] Arrival date & time: 03/14/22  1026      History   Chief Complaint Chief Complaint  Patient presents with   Wheezing    HPI Lauren Bartlett is a 66 y.o. female.   HPI  Lauren Bartlett  has a past medical history of COPD (chronic obstructive pulmonary disease) . She is in today for wheezing. She was seen by her PCP Dec. 5 and had a full work up which was negative. She has been doing well on her home treatment. She uses oxygen 2 liters at night. She is also having nasal congestion and pressure with drainage into her throat. Denies fever, chills headache, dizziness, visual changes, shortness of breath, dyspnea on exertion, chest pain, nausea, vomiting or any edema.   Past Medical History:  Diagnosis Date   Anxiety    Bleeding from the nose    Chronic diastolic congestive heart failure ([place])    a. 03/2016 Echo: EF 55-60%, Gr2 DD; b. 08/2016 Echo: EF 60-65%, nl diast fxn; c. 01/2019 Echo: EF 55-60%. Nl RV fxn.   Congenital absence of one kidney    COPD (chronic obstructive pulmonary disease) ([place])    a. 02/2017 PFT: mod/sev obstructive airway dzs w/ significant bronchodilator response.   Dep[facility]    [facility]   Dyspnea    Emphysema/COPD [facility])    GERD (gastroesophageal reflux disease)    History of cardiac cath    a. 05/2016 Cath: nl cors.   History of sciatica    Hypertension    Hypothyroidism    Mitral Valve Prolapse & Severe mitral regurgitation s/p MVR    a. 03/2016 Echo: severe MVP involving the posterior leaflet, sev MR;  b. 06/2016 min invasive MV repair w/ triangular resection of flail segment (P2) of posterior leaflet, gore-tex neochord placement x 4, Sorin Memo 3D rin Annulosplasty (70m, catalog # [id number] ser # [id number]; c. 08/2016 Echo: MV area 2.06 cm^2 (pressure 1/2 time); d. 01/2019 Echo: Mild Lauren w/ mean grad 838mg.   Motion sickness    cars   Pneumonia    PONV (postoperative nausea and vomiting)    Post-op Afib     a. 06/2016 following MV repair-->short-course amio. Coumadin d/c'd 2/2 epistaxis.   [place]ulmonary hypertension ([place]   a. 08/2016 Echo: PASP 5486m; b. 01/2019 Echo: RVSP 45.3mm61m   S/P patent foramen ovale closure    a. 06/2016 @ time of MV Repair.   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    Patient Active Problem List   Diagnosis Date Noted   Osteopenia determined by x-ray 08/28/2021   Acute bronchitis with COPD (HCC)[place]/13/2023   Unsteady gait when walking 04/29/2021   Aortic atherosclerosis (HCC)[place]/19/2022   Recurrent epistaxis 06/16/2020   PAF (paroxysmal atrial fibrillation) (HCC)[place]/19/2020   Nocturnal hypoxemia due to obstructive chronic bronchitis 02/06/2018   Encounter for therapeutic drug monitoring 08/13/2016   S/P minimally invasive mitral valve repair 07/17/2016   S/P patent foramen ovale closure 07/17/2016   Chronic diastolic congestive heart failure (HCC)[place] COPD (chronic obstructive pulmonary disease) (HCC)[place]/19/2018   Vitamin D deficiency 01/11/2015   Allergic rhinitis 07/09/2014   S/P hysterectomy with oophorectomy 09/22/2013   Pulmonary hypertension (HCC)[place] Hypocalcemia 09/10/2012   Hypothyroidism 05/04/2012   Morbid obesity (HCC)[place]/08/2011   Hypertension    Congenital absence of one kidney    History  of sciatica     Past Surgical History:  Procedure Laterality Date   ABDOMINAL HYSTERECTOMY     BREAST BIOPSY Right 2011   [facility]< benign   CATARACT EXTRACTION W/PHACO Left 11/14/2015   Procedure: CATARACT EXTRACTION PHACO AND INTRAOCULAR LENS PLACEMENT (IOC);  Surgeon: Leandrew Koyanagi, MD;  Location: [place];  Service: Ophthalmology;  Laterality: Left;  sleep apnea [place]   CATARACT EXTRACTION W/PHACO Right 12/19/2015   Procedure: CATARACT EXTRACTION PHACO AND INTRAOCULAR LENS PLACEMENT (IOC);  Surgeon: Leandrew Koyanagi, MD;  Location: [place];  Service: Ophthalmology;  Laterality: Right;  ANXIETY GENEROUS IV  SEDATION TORIC LEN   COMBINED HYSTERECTOMY ABDOMINAL W/ A&P REPAIR / OOPHORECTOMY  1996   benign tumor   INNER EAR SURGERY     bilateral   MITRAL VALVE REPAIR Right 07/17/2016   Procedure: MINIMALLY INVASIVE MITRAL VALVE REPAIR (MVR) USING 26 SORIN MEMO 3D ANNULOPLASTY RING;  Surgeon: Rexene Alberts, MD;  Location: [place];  Service: Open Heart Surgery;  Laterality: Right;   PATENT FORAMEN OVALE(PFO) CLOSURE N/A 07/17/2016   Procedure: PATENT FORAMEN OVALE (PFO) CLOSURE;  Surgeon: Rexene Alberts, MD;  Location: [place];  Service: Open Heart Surgery;  Laterality: N/A;   RIGHT/LEFT HEART CATH AND CORONARY ANGIOGRAPHY Bilateral 06/16/2016   Procedure: Right/Left Heart Cath and Coronary Angiography;  Surgeon: Wellington Hampshire, MD;  Location: [place] [facility];  Service: Cardiovascular;  Laterality: Bilateral;   TEE WITHOUT CARDIOVERSION N/A 05/19/2016   Procedure: TRANSESOPHAGEAL ECHOCARDIOGRAM (TEE);  Surgeon: Wellington Hampshire, MD;  Location: [facility];  Service: Cardiovascular;  Laterality: N/A;   TEE WITHOUT CARDIOVERSION N/A 06/09/2016   Procedure: TRANSESOPHAGEAL ECHOCARDIOGRAM (TEE);  Surgeon: Wellington Hampshire, MD;  Location: [facility];  Service: Cardiovascular;  Laterality: N/A;   TEE WITHOUT CARDIOVERSION N/A 06/16/2016   Procedure: TRANSESOPHAGEAL ECHOCARDIOGRAM (TEE);  Surgeon: Wellington Hampshire, MD;  Location: [facility];  Service: Cardiovascular;  Laterality: N/A;   TEE WITHOUT CARDIOVERSION N/A 07/17/2016   Procedure: TRANSESOPHAGEAL ECHOCARDIOGRAM (TEE);  Surgeon: Rexene Alberts, MD;  Location: [place];  Service: Open Heart Surgery;  Laterality: N/A;   TONSILLECTOMY      OB History   No obstetric history on file.      Home Medications    Prior to Admission medications   Medication Sig Start Date End Date Taking? Authorizing Provider  acetaminophen (TYLENOL) 500 MG tablet Take 500 mg by mouth 2 (two) times daily as needed for moderate pain or headache.    [provider]   albuterol (PROVENTIL) (2.5 MG/3ML) 0.083% nebulizer solution INHALE THE CONTENTS OF 1 VIAL VIA NEBULIZER EVERY 6 HOURS AS NEEDED FOR WHEEZING  OR FOR SHORTNESS OF BREATH 02/06/22   [name], MD  albuterol (VENTOLIN HFA) 108 (90 Base) MCG/ACT inhaler INHALE 2 INHALATIONS BY  MOUTH INTO THE LUNGS EVERY  6 HOURS AS NEEDED FOR  WHEEZING OR SHORTNESS OF  BREATH (USE WITH SPACER) 03/04/22   [name], MD  aspirin EC 81 MG EC tablet Take 1 tablet (81 mg total) by mouth daily. 07/23/16   Barrett, Erin R, PA-C  benzonatate (TESSALON) 200 MG capsule Take 1 capsule (200 mg total) by mouth 3 (three) times daily as needed for cough. 07/11/21   [name], MD  calcitRIOL (ROCALTROL) 0.25 MCG capsule TAKE 1 CAPSULE BY MOUTH DAILY 09/15/21   Dutch Quint B, FNP  Calcium Carb-Cholecalciferol 600-800 MG-UNIT TABS Take 1 tablet by mouth in the morning, at  noon, and at bedtime.     [provider]  docusate sodium (COLACE) 100 MG capsule Take 100 mg by mouth daily.    [provider]  doxycycline (VIBRA-TABS) 100 MG tablet Take 1 tablet (100 mg total) by mouth 2 (two) times daily. 03/04/22   [name], MD  famotidine (PEPCID AC) 10 MG chewable tablet Chew 20 mg by mouth daily as needed for heartburn.     [provider]  ferrous sulfate 325 (65 FE) MG tablet Take 325 mg by mouth daily with breakfast.    [provider]  Fluticasone-Umeclidin-Vilant (TRELEGY ELLIPTA) 100-62.5-25 MCG/ACT AEPB Inhale 1 puff into the lungs daily. 02/06/22   [name], MD  furosemide (LASIX) 20 MG tablet TAKE 1 TABLET BY MOUTH DAILY MAY TAKE AN ADDITIONAL TABLET AS  NEEDED FOR WEIGHT GAIN OF 2 LBS  OVERNIGHT OR 5 LBS IN 1 WEEK 08/20/21   [name], MD  levothyroxine (SYNTHROID) 150 MCG tablet Take 1 tablet (150 mcg total) by mouth daily before breakfast. 02/06/22   [name], MD  lidocaine (LIDODERM) 5 % Place 1 patch onto the skin every 12 (twelve) hours. Remove & Discard  patch within 12 hours or as directed by MD 06/18/21   Carrie Mew, MD  loratadine (CLARITIN) 10 MG tablet Take 10 mg by mouth daily.    [provider]  losartan (COZAAR) 50 MG tablet Take 1 tablet (50 mg total) by mouth daily. 02/06/22   [name], MD  OXYGEN Inhale 2 L into the lungs at bedtime as needed (at bedtime and as needed).    [provider]  oxymetazoline (AFRIN) 0.05 % nasal spray Place 1 spray into both nostrils 2 (two) times daily. As needed for nose bleed    [provider]  polyethylene glycol powder (GLYCOLAX/MIRALAX) powder Take 17 g by mouth 2 (two) times daily as needed. 08/03/15   [name], MD  traMADol (ULTRAM) 50 MG tablet Take 1-2 tablets (50-100 mg total) by mouth every 4 (four) hours as needed for moderate pain. 08/21/16   [name], MD  vitamin B-12 (CYANOCOBALAMIN) 1000 MCG tablet Take 1 tablet (1,000 mcg total) by mouth daily. 08/14/16   [name], MD  Vitamin D, Ergocalciferol, (DRISDOL) 1.25 MG (50000 UNIT) CAPS capsule TAKE 1 CAPSULE BY MOUTH 1 TIME A WEEK 11/21/21   [name], MD    Family History Family History  Problem Relation Age of Onset   Multiple sclerosis Mother    Hypertension Mother    Coronary artery disease Father    Heart disease Father    Hypertension Father    Heart disease Brother    Cancer Brother    Breast cancer Paternal Aunt     Social History Social History   Tobacco Use   Smoking status: Never   Smokeless tobacco: Never  Vaping Use   Vaping Use: Never used  Substance Use Topics   Alcohol use: No   Drug use: No     Allergies   Codeine, Levaquin [levofloxacin], Penicillins, Zithromax [azithromycin], and Prednisone   Review of Systems Review of Systems   Physical Exam Triage Vital Signs ED Triage Vitals [03/14/22 1220]  Enc Vitals Group     BP 134/70     Pulse Rate 86     Resp 18     Temp 98.1 F (36.7 C)     Temp Source Oral     SpO2  99 %     Weight       Height      Head Circumference      Peak Flow      Pain Score      Pain Loc      Pain Edu?      Excl. in [place]?    No data found.  Updated Vital Signs BP 134/70 (BP Location: Left Arm)   Pulse 86   Temp 98.1 F (36.7 C) (Oral)   Resp 18   SpO2 99%   Visual Acuity Right Eye Distance:   Left Eye Distance:   Bilateral Distance:    Right Eye Near:   Left Eye Near:    Bilateral Near:     Physical Exam Constitutional:      General: She is not in acute distress.    Appearance: She is obese.  HENT:     Head: Normocephalic and atraumatic.     Right Ear: Tympanic membrane normal.     Left Ear: Tympanic membrane normal.     Nose: Nose normal.     Mouth/Throat:     Mouth: Mucous membranes are moist.  Cardiovascular:     Rate and Rhythm: Normal rate and regular rhythm.     Pulses: Normal pulses.     Heart sounds: Normal heart sounds.  Pulmonary:     Breath sounds: Wheezing (left bronchole) present.     Comments: Diminished BS throughout Musculoskeletal:     Cervical back: Normal range of motion.     Comments: In Wheelchair  Skin:    General: Skin is warm.     Capillary Refill: Capillary refill takes less than 2 seconds.  Neurological:     General: No focal deficit present.     Mental Status: She is alert and oriented to person, place, and time.  Psychiatric:        Mood and Affect: Mood normal.        Behavior: Behavior normal.      UC Treatments / Results  Labs (all labs ordered are listed, but only abnormal results are displayed) Labs Reviewed - No data to display  EKG   Radiology No results found.  Procedures Procedures (including critical care time)  Medications Ordered in UC Medications  methylPREDNISolone acetate (DEPO-MEDROL) injection 40 mg (has no administration in time range)    Initial Impression / Assessment and Plan / UC Course  I have reviewed the triage vital signs and the nursing notes.  Pertinent labs & imaging results that were  available during my care of the patient were reviewed by me and considered in my medical decision making (see chart for details).     Wheezing  Final Clinical Impressions(s) / UC Diagnoses   Final diagnoses:  Wheezing  COPD exacerbation ([place])     Discharge Instructions      You have wheezing in the left bronchiole. You were given methylPREDNISolone acetate (DEPO-MEDROL) injection 40 mg  IM x1 .  The recommendation is to take Tylenol Sinus as directed and continue with home medications as prescribed.      ED Prescriptions   None    PDMP not reviewed this encounter.   Dionisio David [place], NP 03/14/22 1314

## 2022-03-14 NOTE — ED Triage Notes (Signed)
Pt reports a history of COPD. Pt reports some wheezing since 03/02/2022 when she was seen by her PCP. Pt was given a steroid injection, however symptoms have not improved. Last use neb this morning around 10 am.

## 2022-03-14 NOTE — Discharge Instructions (Signed)
You have wheezing in the left bronchiole. You were given methylPREDNISolone acetate (DEPO-MEDROL) injection 40 mg  IM x1 .  The recommendation is to take Tylenol Sinus as directed and continue with home medications as prescribed.

## 2022-04-03 ENCOUNTER — Ambulatory Visit (INDEPENDENT_AMBULATORY_CARE_PROVIDER_SITE_OTHER): Payer: Medicare Other

## 2022-04-03 ENCOUNTER — Ambulatory Visit
Admission: EM | Admit: 2022-04-03 | Discharge: 2022-04-03 | Disposition: A | Discharge status: Home / Self Care | Payer: Medicare Other | Attending: Family Medicine | Admitting: Family Medicine

## 2022-04-03 DIAGNOSIS — J441 Chronic obstructive pulmonary disease with (acute) exacerbation: Secondary | ICD-10-CM | POA: Insufficient documentation

## 2022-04-03 DIAGNOSIS — R0602 Shortness of breath: Secondary | ICD-10-CM

## 2022-04-03 DIAGNOSIS — M7989 Other specified soft tissue disorders: Secondary | ICD-10-CM | POA: Insufficient documentation

## 2022-04-03 DIAGNOSIS — Z1152 Encounter for screening for COVID-19: Secondary | ICD-10-CM | POA: Insufficient documentation

## 2022-04-03 LAB — RESP PANEL BY RT-PCR (RSV, FLU A&B, COVID)  RVPGX2
Influenza A by PCR: NEGATIVE
Influenza B by PCR: NEGATIVE
Resp Syncytial Virus by PCR: NEGATIVE
SARS Coronavirus 2 by RT PCR: NEGATIVE

## 2022-04-03 MED ORDER — DEXAMETHASONE SODIUM PHOSPHATE 10 MG/ML IJ SOLN
10.0000 mg | Freq: Once | INTRAMUSCULAR | Status: AC
  Administered 2022-04-03: 10 mg via INTRAMUSCULAR

## 2022-04-03 MED ORDER — IPRATROPIUM-ALBUTEROL 0.5-2.5 (3) MG/3ML IN SOLN
3.0000 mL | Freq: Once | RESPIRATORY_TRACT | Status: AC
  Administered 2022-04-03: 3 mL via RESPIRATORY_TRACT

## 2022-04-03 MED ORDER — DOXYCYCLINE HYCLATE 100 MG PO TABS: 100.0000 mg | ORAL_TABLET | Freq: Two times a day (BID) | ORAL | 0 refills | Status: AC

## 2022-04-03 NOTE — Discharge Instructions (Addendum)
Your COVID flu and RSV testing was negative.  I suspect you are having a flareup of your COPD.  You were given a breathing treatment and a steroid injection here.  Stop by the pharmacy to pick up your antibiotics.  Go to ED for red flag symptoms, including; fevers you cannot reduce with Tylenol/Motrin, severe headaches, vision changes, numbness/weakness in part of the body, lethargy, confusion, intractable vomiting, severe dehydration, chest pain, breathing difficulty, severe persistent abdominal or pelvic pain, signs of severe infection (increased redness, swelling of an area), feeling faint or passing out, dizziness, etc. You should especially go to the ED for sudden acute worsening of condition if you do not elect to go at this time.

## 2022-04-03 NOTE — ED Provider Notes (Signed)
[facility]    CSN: [id number] Arrival date & time: 04/03/22  1805      History   Chief Complaint Chief Complaint  Patient presents with   Shortness of Breath    HPI MARKERIA GOETSCH is a 67 y.o. female.   HPI   Amany presents for shortness of breath and wheezing for the past 3 days.  States that she has been using her inhalers and oxygen as needed where she normally only uses it at bedtime.  She endorses rhinorrhea and nasal congestion.  There has been no fever, chills, nausea, vomiting, chest pain.  She feels like she needs another steroid injection.  She has some swelling in her legs and uses a cane to get around.  Has known COPD without history of smoking.  She follows with cardiology and pulmonology..       Past Medical History:  Diagnosis Date   Anxiety    Bleeding from the nose    Chronic diastolic congestive heart failure ([place])    a. 03/2016 Echo: EF 55-60%, Gr2 DD; b. 08/2016 Echo: EF 60-65%, nl diast fxn; c. 01/2019 Echo: EF 55-60%. Nl RV fxn.   Congenital absence of one kidney    COPD (chronic obstructive pulmonary disease) ([place])    a. 02/2017 PFT: mod/sev obstructive airway dzs w/ significant bronchodilator response.   Dep[facility]    [facility]   Dyspnea    Emphysema/COPD [facility])    GERD (gastroesophageal reflux disease)    History of cardiac cath    a. 05/2016 Cath: nl cors.   History of sciatica    Hypertension    Hypothyroidism    Mitral Valve Prolapse & Severe mitral regurgitation s/p MVR    a. 03/2016 Echo: severe MVP involving the posterior leaflet, sev MR;  b. 06/2016 min invasive MV repair w/ triangular resection of flail segment (P2) of posterior leaflet, gore-tex neochord placement x 4, Sorin Memo 3D rin Annulosplasty (53m, catalog # [id number] ser # [id number]; c. 08/2016 Echo: MV area 2.06 cm^2 (pressure 1/2 time); d. 01/2019 Echo: Mild MS w/ mean grad 871mg.   Motion sickness    cars   Pneumonia    PONV (postoperative nausea and  vomiting)    Post-op Afib    a. 06/2016 following MV repair-->short-course amio. Coumadin d/c'd 2/2 epistaxis.   Pulmonary hypertension ([facility]   a. 08/2016 Echo: PASP 5442m; b. 01/2019 Echo: RVSP 45.3mm76m   S/P patent foramen ovale closure    a. 06/2016 @ time of MV Repair.   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    Patient Active Problem List   Diagnosis Date Noted   Osteopenia determined by x-ray 08/28/2021   Acute bronchitis with COPD (HCC)[place]/13/2023   Unsteady gait when walking 04/29/2021   Aortic atherosclerosis (HCC)[place]/19/2022   Recurrent epistaxis 06/16/2020   PAF (paroxysmal atrial fibrillation) (HCC)[place]/19/2020   Nocturnal hypoxemia due to obstructive chronic bronchitis 02/06/2018   Encounter for therapeutic drug monitoring 08/13/2016   S/P minimally invasive mitral valve repair 07/17/2016   S/P patent foramen ovale closure 07/17/2016   Chronic diastolic congestive heart failure (HCC)[place] COPD (chronic obstructive pulmonary disease) (HCC)[place]/19/2018   Vitamin D deficiency 01/11/2015   Allergic rhinitis 07/09/2014   S/P hysterectomy with oophorectomy 09/22/2013   Pulmonary hypertension (HCC)[place] Hypocalcemia 09/10/2012   Hypothyroidism 05/04/2012   Morbid obesity (HCC)[place]/08/2011   Hypertension  Congenital absence of one kidney    History of sciatica     Past Surgical History:  Procedure Laterality Date   ABDOMINAL HYSTERECTOMY     BREAST BIOPSY Right 2011   [facility]< benign   CATARACT EXTRACTION W/PHACO Left 11/14/2015   Procedure: CATARACT EXTRACTION PHACO AND INTRAOCULAR LENS PLACEMENT (IOC);  Surgeon: Leandrew Koyanagi, MD;  Location: [place];  Service: Ophthalmology;  Laterality: Left;  sleep apnea [place]   CATARACT EXTRACTION W/PHACO Right 12/19/2015   Procedure: CATARACT EXTRACTION PHACO AND INTRAOCULAR LENS PLACEMENT (IOC);  Surgeon: Leandrew Koyanagi, MD;  Location: [place];  Service: Ophthalmology;   Laterality: Right;  ANXIETY GENEROUS IV SEDATION TORIC LEN   COMBINED HYSTERECTOMY ABDOMINAL W/ A&P REPAIR / OOPHORECTOMY  1996   benign tumor   INNER EAR SURGERY     bilateral   MITRAL VALVE REPAIR Right 07/17/2016   Procedure: MINIMALLY INVASIVE MITRAL VALVE REPAIR (MVR) USING 26 SORIN MEMO 3D ANNULOPLASTY RING;  Surgeon: Rexene Alberts, MD;  Location: [place];  Service: Open Heart Surgery;  Laterality: Right;   PATENT FORAMEN OVALE(PFO) CLOSURE N/A 07/17/2016   Procedure: PATENT FORAMEN OVALE (PFO) CLOSURE;  Surgeon: Rexene Alberts, MD;  Location: [place];  Service: Open Heart Surgery;  Laterality: N/A;   RIGHT/LEFT HEART CATH AND CORONARY ANGIOGRAPHY Bilateral 06/16/2016   Procedure: Right/Left Heart Cath and Coronary Angiography;  Surgeon: Wellington Hampshire, MD;  Location: [facility];  Service: Cardiovascular;  Laterality: Bilateral;   TEE WITHOUT CARDIOVERSION N/A 05/19/2016   Procedure: TRANSESOPHAGEAL ECHOCARDIOGRAM (TEE);  Surgeon: Wellington Hampshire, MD;  Location: [facility];  Service: Cardiovascular;  Laterality: N/A;   TEE WITHOUT CARDIOVERSION N/A 06/09/2016   Procedure: TRANSESOPHAGEAL ECHOCARDIOGRAM (TEE);  Surgeon: Wellington Hampshire, MD;  Location: [facility];  Service: Cardiovascular;  Laterality: N/A;   TEE WITHOUT CARDIOVERSION N/A 06/16/2016   Procedure: TRANSESOPHAGEAL ECHOCARDIOGRAM (TEE);  Surgeon: Wellington Hampshire, MD;  Location: [facility];  Service: Cardiovascular;  Laterality: N/A;   TEE WITHOUT CARDIOVERSION N/A 07/17/2016   Procedure: TRANSESOPHAGEAL ECHOCARDIOGRAM (TEE);  Surgeon: Rexene Alberts, MD;  Location: [place];  Service: Open Heart Surgery;  Laterality: N/A;   TONSILLECTOMY      OB History   No obstetric history on file.      Home Medications    Prior to Admission medications   Medication Sig Start Date End Date Taking? Authorizing Provider  acetaminophen (TYLENOL) 500 MG tablet Take 500 mg by mouth 2 (two) times daily as needed for moderate pain or  headache.   Yes [provider]  albuterol (PROVENTIL) (2.5 MG/3ML) 0.083% nebulizer solution INHALE THE CONTENTS OF 1 VIAL VIA NEBULIZER EVERY 6 HOURS AS NEEDED FOR WHEEZING  OR FOR SHORTNESS OF BREATH 02/06/22  Yes [name], MD  albuterol (VENTOLIN HFA) 108 (90 Base) MCG/ACT inhaler INHALE 2 INHALATIONS BY  MOUTH INTO THE LUNGS EVERY  6 HOURS AS NEEDED FOR  WHEEZING OR SHORTNESS OF  BREATH (USE WITH SPACER) 03/04/22  Yes [name], MD  aspirin EC 81 MG EC tablet Take 1 tablet (81 mg total) by mouth daily. 07/23/16  Yes Barrett, Erin R, PA-C  benzonatate (TESSALON) 200 MG capsule Take 1 capsule (200 mg total) by mouth 3 (three) times daily as needed for cough. 07/11/21  Yes [name], MD  calcitRIOL (ROCALTROL) 0.25 MCG capsule TAKE 1 CAPSULE BY MOUTH DAILY 09/15/21  Yes Dutch Quint B, FNP  Calcium Carb-Cholecalciferol 600-800 MG-UNIT TABS  Take 1 tablet by mouth in the morning, at noon, and at bedtime.    Yes [provider]  docusate sodium (COLACE) 100 MG capsule Take 100 mg by mouth daily.   Yes [provider]  famotidine (PEPCID AC) 10 MG chewable tablet Chew 20 mg by mouth daily as needed for heartburn.    Yes [provider]  ferrous sulfate 325 (65 FE) MG tablet Take 325 mg by mouth daily with breakfast.   Yes [provider]  Fluticasone-Umeclidin-Vilant (TRELEGY ELLIPTA) 100-62.5-25 MCG/ACT AEPB Inhale 1 puff into the lungs daily. 02/06/22  Yes [name], MD  furosemide (LASIX) 20 MG tablet TAKE 1 TABLET BY MOUTH DAILY MAY TAKE AN ADDITIONAL TABLET AS  NEEDED FOR WEIGHT GAIN OF 2 LBS  OVERNIGHT OR 5 LBS IN 1 WEEK 08/20/21  Yes [name], MD  levothyroxine (SYNTHROID) 150 MCG tablet Take 1 tablet (150 mcg total) by mouth daily before breakfast. 02/06/22  Yes [name], MD  lidocaine (LIDODERM) 5 % Place 1 patch onto the skin every 12 (twelve) hours. Remove & Discard patch within 12 hours or as directed by MD 06/18/21   Yes Carrie Mew, MD  loratadine (CLARITIN) 10 MG tablet Take 10 mg by mouth daily.   Yes [provider]  losartan (COZAAR) 50 MG tablet Take 1 tablet (50 mg total) by mouth daily. 02/06/22  Yes [name], MD  OXYGEN Inhale 2 L into the lungs at bedtime as needed (at bedtime and as needed).   Yes [provider]  oxymetazoline (AFRIN) 0.05 % nasal spray Place 1 spray into both nostrils 2 (two) times daily. As needed for nose bleed   Yes [provider]  polyethylene glycol powder (GLYCOLAX/MIRALAX) powder Take 17 g by mouth 2 (two) times daily as needed. 08/03/15  Yes [name], MD  traMADol (ULTRAM) 50 MG tablet Take 1-2 tablets (50-100 mg total) by mouth every 4 (four) hours as needed for moderate pain. 08/21/16  Yes [name], MD  vitamin B-12 (CYANOCOBALAMIN) 1000 MCG tablet Take 1 tablet (1,000 mcg total) by mouth daily. 08/14/16  Yes [name], MD  Vitamin D, Ergocalciferol, (DRISDOL) 1.25 MG (50000 UNIT) CAPS capsule TAKE 1 CAPSULE BY MOUTH 1 TIME A WEEK 11/21/21  Yes [name], MD  doxycycline (VIBRA-TABS) 100 MG tablet Take 1 tablet (100 mg total) by mouth 2 (two) times daily for 7 days. 04/03/22 04/10/22  Lyndee Hensen, DO    Family History Family History  Problem Relation Age of Onset   Multiple sclerosis Mother    Hypertension Mother    Coronary artery disease Father    Heart disease Father    Hypertension Father    Heart disease Brother    Cancer Brother    Breast cancer Paternal Aunt     Social History Social History   Tobacco Use   Smoking status: Never   Smokeless tobacco: Never  Vaping Use   Vaping Use: Never used  Substance Use Topics   Alcohol use: No   Drug use: No     Allergies   Codeine, Levaquin [levofloxacin], Penicillins, Zithromax [azithromycin], and Prednisone   Review of Systems Review of Systems: negative unless otherwise stated in HPI.      Physical Exam Triage Vital Signs ED Triage  Vitals  Enc Vitals Group     BP 04/03/22 1929 (!) 145/67     Pulse Rate 04/03/22 1929 86  Resp 04/03/22 1929 18     Temp 04/03/22 1929 98.1 F (36.7 C)     Temp Source 04/03/22 1929 Oral     SpO2 04/03/22 1929 97 %     Weight 04/03/22 1930 250 lb (113.4 kg)     Height 04/03/22 1930 '5\' 2"'$  (1.575 m)     Head Circumference --      Peak Flow --      Pain Score 04/03/22 1929 0     Pain Loc --      Pain Edu? --      Excl. in [place]? --    No data found.  Updated Vital Signs BP (!) 145/67 (BP Location: Left Arm)   Pulse 86   Temp 98.1 F (36.7 C) (Oral)   Resp 18   Ht '5\' 2"'$  (1.575 m)   Wt 113.4 kg   SpO2 97%   BMI 45.73 kg/m   Visual Acuity Right Eye Distance:   Left Eye Distance:   Bilateral Distance:    Right Eye Near:   Left Eye Near:    Bilateral Near:     Physical Exam GEN: pleasant, chronically ill-appearing appearing female, in no acute distress  HENT: +nasal discharge, nares patent, moist membranes CV: regular rate and rhythm RESP: no increased work of breathing, diffuse expiratory wheezing, no rales, no rhonchi MSK: baseline chronic LE edema (per pt), seated in wheelchair  SKIN: warm, dry NEURO: alert, moves all extremities appropriately PSYCH: Normal affect, appropriate speech and behavior     UC Treatments / Results  Labs (all labs ordered are listed, but only abnormal results are displayed) Labs Reviewed  RESP PANEL BY RT-PCR (RSV, FLU A&B, COVID)  [id number]    EKG   Radiology DG Chest 2 View  Result Date: 04/03/2022 CLINICAL DATA:  Shortness of breath EXAM: CHEST - 2 VIEW COMPARISON:  03/04/2022 FINDINGS: Cardiomegaly. Unchanged severe elevation of the right hemidiaphragm. Disc degenerative disease of the thoracic spine. IMPRESSION: Cardiomegaly. Unchanged severe elevation of the right hemidiaphragm. No acute appearing airspace opacity. Electronically Signed   By: Delanna Ahmadi M.D.   On: 04/03/2022 19:29    Procedures Procedures (including  critical care time)  Medications Ordered in UC Medications  dexamethasone (DECADRON) injection 10 mg (10 mg Intramuscular Given 04/03/22 1950)  ipratropium-albuterol (DUONEB) 0.5-2.5 (3) MG/3ML nebulizer solution 3 mL (3 mLs Nebulization Given 04/03/22 2003)    Initial Impression / Assessment and Plan / UC Course  I have reviewed the triage vital signs and the nursing notes.  Pertinent labs & imaging results that were available during my care of the patient were reviewed by me and considered in my medical decision making (see chart for details).       Pt is a 67 y.o. female with COPD with bedtime oxygen who presents for  shortness of breath and wheezing for the past 3 days.  Overall patient is mostly chronically ill-appearing, well-hydrated and without respiratory distress.  On exam, she has diffuse expiratory wheezing. COVID, influenza and RSV testing are negative. Reports she cannot take oral prednisone.  She prefers injections.  Given 10 mg Decadron IM today.  Reviewed recent note from pulmonology. Given a DuoNeb and IM Decadron 10 mg. CXR obtained and personally reviewed is without pneumothorax, focal pneumonia or pleural effusion.  Radiologist reports stable right hemidiaphragm elevation.  I suspect she has an acute exacerbation of COPD.   Prescription written for doxycycline as well.  She is to follow-up with her pulmonologist as  needed.     Final Clinical Impressions(s) / UC Diagnoses   Final diagnoses:  COPD exacerbation [facility])     Discharge Instructions      Your COVID flu and RSV testing was negative.  I suspect you are having a flareup of your COPD.  You were given a breathing treatment and a steroid injection here.  Stop by the pharmacy to pick up your antibiotics.  Go to ED for red flag symptoms, including; fevers you cannot reduce with Tylenol/Motrin, severe headaches, vision changes, numbness/weakness in part of the body, lethargy, confusion, intractable vomiting, severe  dehydration, chest pain, breathing difficulty, severe persistent abdominal or pelvic pain, signs of severe infection (increased redness, swelling of an area), feeling faint or passing out, dizziness, etc. You should especially go to the ED for sudden acute worsening of condition if you do not elect to go at this time.       ED Prescriptions     Medication Sig Dispense Auth. Provider   doxycycline (VIBRA-TABS) 100 MG tablet Take 1 tablet (100 mg total) by mouth 2 (two) times daily for 7 days. 14 tablet Calyssa Zobrist, Ronnette Juniper, DO      PDMP not reviewed this encounter.   Lyndee Hensen, DO 04/03/22 2216

## 2022-04-03 NOTE — ED Triage Notes (Signed)
Pt c/o sob & wheezing x3 days, has hx of COPD, states she needs a steroid injection.

## 2022-04-07 DIAGNOSIS — J449 Chronic obstructive pulmonary disease, unspecified: Secondary | ICD-10-CM | POA: Diagnosis not present

## 2022-04-10 ENCOUNTER — Other Ambulatory Visit: Payer: Medicare Other

## 2022-04-17 ENCOUNTER — Ambulatory Visit (INDEPENDENT_AMBULATORY_CARE_PROVIDER_SITE_OTHER): Payer: Medicare Other | Admitting: Pulmonary Disease

## 2022-04-17 ENCOUNTER — Encounter: Payer: Self-pay | Admitting: Pulmonary Disease

## 2022-04-17 VITALS — BP 124/82 | HR 86 | Temp 97.8°F | Ht 62.0 in | Wt 250.0 lb

## 2022-04-17 DIAGNOSIS — J4489 Other specified chronic obstructive pulmonary disease: Secondary | ICD-10-CM | POA: Diagnosis not present

## 2022-04-17 DIAGNOSIS — K766 Portal hypertension: Secondary | ICD-10-CM | POA: Diagnosis not present

## 2022-04-17 DIAGNOSIS — G4736 Sleep related hypoventilation in conditions classified elsewhere: Secondary | ICD-10-CM

## 2022-04-17 DIAGNOSIS — I2721 Secondary pulmonary arterial hypertension: Secondary | ICD-10-CM | POA: Diagnosis not present

## 2022-04-17 DIAGNOSIS — J4541 Moderate persistent asthma with (acute) exacerbation: Secondary | ICD-10-CM

## 2022-04-17 MED ORDER — DOXYCYCLINE HYCLATE 100 MG PO TABS: 100.0000 mg | ORAL_TABLET | Freq: Two times a day (BID) | ORAL | 0 refills | Status: DC

## 2022-04-17 MED ORDER — TRELEGY ELLIPTA 200-62.5-25 MCG/ACT IN AEPB: 1.0000 | INHALATION_SPRAY | Freq: Every day | RESPIRATORY_TRACT | 4 refills | Status: DC

## 2022-04-17 MED ORDER — ALBUTEROL SULFATE (2.5 MG/3ML) 0.083% IN NEBU: INHALATION_SOLUTION | RESPIRATORY_TRACT | 1 refills | Status: DC

## 2022-04-17 MED ORDER — TRELEGY ELLIPTA 200-62.5-25 MCG/ACT IN AEPB: 1.0000 | INHALATION_SPRAY | Freq: Every day | RESPIRATORY_TRACT | 0 refills | Status: DC

## 2022-04-17 MED ORDER — METHYLPREDNISOLONE ACETATE 80 MG/ML IJ SUSP
120.0000 mg | Freq: Once | INTRAMUSCULAR | Status: AC
  Administered 2022-04-17: 120 mg via INTRAMUSCULAR

## 2022-04-17 NOTE — Progress Notes (Signed)
Subjective:    Patient ID: Lauren Bartlett, female    DOB: 1956-02-24, 67 y.o.   MRN: [id number] Patient Care Team: [name], MD as PCP - General (Internal Medicine) Wellington Hampshire, MD as PCP - Cardiology (Cardiology)  Requesting MD/Service: Dr. Deborra Medina        Date of initial consultation: 03/07/16 by Dr. Merton Border         Reason for consultation: tracheobronchomalacia, DOE   PT PROFILE: 67 y.o. F never smoker with significant lifetime exposure to second hand smoke with multiple medical problems including chronic hypoxic respiratory failure, moderate COPD, mitral valve prolapse and regurgitation, pulm htn by echocardiogram, appearance of tracheomalacia on CT chest previously followed by Dr Raul Del and referred for further evaluation of severe exertional limitation. Initial eval suggested severe mitral regurgitation and impression was that this was a major contributor to her severe DOE   DATA: Echocardiogram 02/01/13: Normal LVEF. MVP with moderate MR, Moderate AI, severe TI. RVSP est 91 mmHg CT chest 07/29/15: Cardiomegaly with left atrial dilatation. Tracheobronchomalacia. Air trapping in the lungs bilaterally, indicative of small airways disease Echocardiogram 06/13/14: Normal LVEF, Severe MR, RVSP est 62 mmHg Spirometry 03/12/15: FVC 1.94 L (73% pred). FEV1 1.19 L (55% pred), FEV1/FVC 61% 6MWT 03/12/15: 225 meters, no desaturation LE venous US 07/29/15: No DVT Echocardiogram 07/31/15: LVEF 60-65%. MV poorly visualized, trivial MR, LA size normal, TV poorly visualized, RVSP not estimated CT chest 03/11/16: Stable changes of tracheobronchomalacia Echocardiogram 04/15/16: Normal LVEF, Severe MR, moderate PAH Mitral valvuloplasty 07/17/16: @ [place] by Dr Roxy Manns Echocardiogram 09/15/16: LVEF 60-65%. RVSP estimate 54 mmHg ONO 10/21/16: on RA. 45 mins < 89%, lowest SpO2 84% PFTs 03/26/17: FVC: 1.73 > 1.74 L (63 %pred), FEV1: 1.01 > 1.16 L (45> 52%pred, 15% improvement after  bronchodilator), FEV1/FVC: 59 >67%, TLC: 3.27 L (73 %pred), DLCO 46 %pred, DLCO/VA 103% 07/24/2020 echocardiogram: LVEF 55 to 60%, mildly elevated pulmonary artery systolic pressure mildly dilated left atrium, prosthetic annuloplasty ring in the mitral position.  INTERVAL:  Last seen by me on 25 November 2021.  At that time she had a minor exacerbation and was treated with doxycycline.  Did well after that.  Chief Complaint  Patient presents with   Follow-up    SOB and wheezing for 2-3 days. Cough with green sputum this morning.    HPI Patient is a 67 year old lifelong never smoker with chronic asthmatic bronchitis and tracheobronchomalacia.  This is a scheduled visit.  She was last seen on 25 November 2021 and at that time she had a minor exacerbation that was treated with doxycycline.  She prefers to get steroids as injectables due to perceived side effects from p.o. steroids.  She was seen in urgent care on 15 December and for January with an exacerbation of asthmatic bronchitis.  She has not noticed increased O2 requirements.  Uses oxygen at nighttime.  At her 2 visits with urgent care she did not receive antibiotics.  She presents today stating that her sputum has changed from pale yellow to green.  Pale yellow to green.  She has not had any fevers, chills or sweats.  Does note some wheezing over the last 2 to 3 days.  She ran out of her albuterol for nebulizer use.  Does compliant with nocturnal oxygen.  She has not had any orthopnea or paroxysmal nocturnal dyspnea.  She does not endorse any other symptomatology.   Review of Systems A 10 point review of systems was performed and  it is as noted above otherwise negative.  Patient Active Problem List   Diagnosis Date Noted   Osteopenia determined by x-ray 08/28/2021   Asthmatic Bronchitis, chronic 07/11/2021   Unsteady gait when walking 04/29/2021   Aortic atherosclerosis ([place]) 09/16/2020   Recurrent epistaxis 06/16/2020   PAF (paroxysmal  atrial fibrillation) ([place]) 12/18/2018   Nocturnal hypoxemia due to obstructive chronic bronchitis 02/06/2018   Encounter for therapeutic drug monitoring 08/13/2016   S/P minimally invasive mitral valve repair 07/17/2016   S/P patent foramen ovale closure 07/17/2016   Chronic diastolic congestive heart failure ([place])    Vitamin D deficiency 01/11/2015   Allergic rhinitis 07/09/2014   S/P hysterectomy with oophorectomy 09/22/2013   Pulmonary hypertension ([place])    Hypocalcemia 09/10/2012   Hypothyroidism 05/04/2012   Morbid obesity ([place]) 08/04/2011   Hypertension    Congenital absence of one kidney    History of sciatica    Social History   Tobacco Use   Smoking status: Never   Smokeless tobacco: Never  Substance Use Topics   Alcohol use: No   Allergies  Allergen Reactions   Codeine Hives and Nausea And Vomiting   Levaquin [Levofloxacin] Nausea And Vomiting   Penicillins Hives, Itching and Other (See Comments)    Has patient had a PCN reaction causing immediate rash, facial/tongue/throat swelling, SOB or lightheadedness with hypotension: No Has patient had a PCN reaction causing severe rash involving mucus membranes or skin necrosis: No Has patient had a PCN reaction that required hospitalization No Has patient had a PCN reaction occurring within the last 10 years: No If all of the above answers are "NO", then may proceed with Cephalosporin use.   Zithromax [Azithromycin] Other (See Comments)    Reaction:  Hallucinations    Prednisone Palpitations and Other (See Comments)    Reaction:  Hallucinations  Can take injections but not oral meds   Current Meds  Medication Sig   acetaminophen (TYLENOL) 500 MG tablet Take 500 mg by mouth 2 (two) times daily as needed for moderate pain or headache.   albuterol (PROVENTIL) (2.5 MG/3ML) 0.083% nebulizer solution INHALE THE CONTENTS OF 1 VIAL VIA NEBULIZER EVERY 6 HOURS AS NEEDED FOR WHEEZING  OR FOR SHORTNESS OF BREATH   albuterol  (VENTOLIN HFA) 108 (90 Base) MCG/ACT inhaler INHALE 2 INHALATIONS BY  MOUTH INTO THE LUNGS EVERY  6 HOURS AS NEEDED FOR  WHEEZING OR SHORTNESS OF  BREATH (USE WITH SPACER)   aspirin EC 81 MG EC tablet Take 1 tablet (81 mg total) by mouth daily.   benzonatate (TESSALON) 200 MG capsule Take 1 capsule (200 mg total) by mouth 3 (three) times daily as needed for cough.   calcitRIOL (ROCALTROL) 0.25 MCG capsule TAKE 1 CAPSULE BY MOUTH DAILY   Calcium Carb-Cholecalciferol 600-800 MG-UNIT TABS Take 1 tablet by mouth in the morning, at noon, and at bedtime.    docusate sodium (COLACE) 100 MG capsule Take 100 mg by mouth daily.   famotidine (PEPCID AC) 10 MG chewable tablet Chew 20 mg by mouth daily as needed for heartburn.    ferrous sulfate 325 (65 FE) MG tablet Take 325 mg by mouth daily with breakfast.   Fluticasone-Umeclidin-Vilant (TRELEGY ELLIPTA) 100-62.5-25 MCG/ACT AEPB Inhale 1 puff into the lungs daily.   furosemide (LASIX) 20 MG tablet TAKE 1 TABLET BY MOUTH DAILY MAY TAKE AN ADDITIONAL TABLET AS  NEEDED FOR WEIGHT GAIN OF 2 LBS  OVERNIGHT OR 5 LBS IN 1 WEEK   levothyroxine (SYNTHROID) 150  MCG tablet Take 1 tablet (150 mcg total) by mouth daily before breakfast.   lidocaine (LIDODERM) 5 % Place 1 patch onto the skin every 12 (twelve) hours. Remove & Discard patch within 12 hours or as directed by MD   loratadine (CLARITIN) 10 MG tablet Take 10 mg by mouth daily.   losartan (COZAAR) 50 MG tablet Take 1 tablet (50 mg total) by mouth daily.   OXYGEN Inhale 2 L into the lungs at bedtime as needed (at bedtime and as needed).   oxymetazoline (AFRIN) 0.05 % nasal spray Place 1 spray into both nostrils 2 (two) times daily. As needed for nose bleed   polyethylene glycol powder (GLYCOLAX/MIRALAX) powder Take 17 g by mouth 2 (two) times daily as needed.   traMADol (ULTRAM) 50 MG tablet Take 1-2 tablets (50-100 mg total) by mouth every 4 (four) hours as needed for moderate pain.   vitamin B-12  (CYANOCOBALAMIN) 1000 MCG tablet Take 1 tablet (1,000 mcg total) by mouth daily.   Vitamin D, Ergocalciferol, (DRISDOL) 1.25 MG (50000 UNIT) CAPS capsule TAKE 1 CAPSULE BY MOUTH 1 TIME A WEEK   Immunization History  Administered Date(s) Administered   Fluad Quad(high Dose 65+) 12/20/2020   Influenza Split 01/02/2012   Influenza,inj,Quad PF,6+ Mos 03/13/2014, 01/08/2015, 02/03/2018, 01/26/2019, 02/02/2020   Influenza-Unspecified 01/11/2013, 12/30/2014   PFIZER(Purple Top)SARS-COV-2 Vaccination 08/12/2019, 09/07/2019   Pneumococcal Conjugate-13 02/27/2015   Pneumococcal Polysaccharide-23 03/07/2013, 04/13/2019   Tdap 11/12/2009       Objective:   Physical Exam BP 124/82 (BP Location: Left Arm, Cuff Size: Large)   Pulse 86   Temp 97.8 F (36.6 C)   Ht '5\' 2"'$  (1.575 m)   Wt 250 lb (113.4 kg) Comment: per patient. she is in a wheelchair  SpO2 97%   BMI 45.73 kg/m '  SpO2: 97 % O2 Device: None (Room air)   GENERAL: Obese woman, no acute distress, presents in transport chair.  No conversational dyspnea.  Looks older than stated age. HEAD: Normocephalic, atraumatic.  EYES: Pupils equal, round, reactive to light.  No scleral icterus.  MOUTH: Oral mucosa moist.  No thrush. NECK: Supple. No thyromegaly. Trachea midline. No JVD.  No adenopathy. PULMONARY: Good air entry bilaterally.  He has scattered wheezes and rhonchi throughout. CARDIOVASCULAR: S1 and S2. Regular rate and rhythm.  Accentuated P2. ABDOMEN: Obese otherwise benign. MUSCULOSKELETAL: No joint deformity, no clubbing, no edema.  NEUROLOGIC: No overt focal deficit, gait not tested patient presents in transport chair. SKIN: Intact,warm,dry. PSYCH: Flat affect.        Assessment & Plan:     ICD-10-CM   1. Moderate persistent asthmatic bronchitis with acute exacerbation  J45.41 methylPREDNISolone acetate (DEPO-MEDROL) injection 120 mg   Received Depo-Medrol 120 mg IM x 1. Doxycycline 100 mg twice daily for 7  days Increase Trelegy strength to 200 Refill albuterol nebulizer solution    2. Nocturnal hypoxemia due to obstructive chronic bronchitis  J44.89    G47.36    Oxygen supplementation 2 L/min nocturnally Patient compliant and notes benefit of therapy    3. PAH (pulmonary arterial hypertension) with portal hypertension ([facility])  I27.21    K76.6    This issue adds complexity to her management     Meds ordered this encounter  Medications   albuterol (PROVENTIL) (2.5 MG/3ML) 0.083% nebulizer solution    Sig: INHALE THE CONTENTS OF 1 VIAL VIA NEBULIZER EVERY 6 HOURS AS NEEDED FOR WHEEZING  OR FOR SHORTNESS OF BREATH    Dispense:  180 mL    Refill:  1   Fluticasone-Umeclidin-Vilant (TRELEGY ELLIPTA) 200-62.5-25 MCG/ACT AEPB    Sig: Inhale 1 puff into the lungs daily.    Dispense:  90 each    Refill:  4   doxycycline (VIBRA-TABS) 100 MG tablet    Sig: Take 1 tablet (100 mg total) by mouth 2 (two) times daily.    Dispense:  14 tablet    Refill:  0   Fluticasone-Umeclidin-Vilant (TRELEGY ELLIPTA) 200-62.5-25 MCG/ACT AEPB    Sig: Inhale 1 puff into the lungs daily.    Dispense:  28 each    Refill:  0    Order Specific Question:   Lot Number?    Answer:   [id number]    Order Specific Question:   Expiration Date?    Answer:   08/30/2023    Order Specific Question:   Quantity    Answer:   2   methylPREDNISolone acetate (DEPO-MEDROL) injection 120 mg   Will see the patient in follow-up in 4 to 6 weeks time.  She is to contact us prior to that time should any new difficulties arise.    Renold Don, MD Advanced Bronchoscopy PCCM [facility]-    *This note was dictated using voice recognition software/Dragon.  Despite best efforts to proofread, errors can occur which can change the meaning. Any transcriptional errors that result from this process are unintentional and may not be fully corrected at the time of dictation.

## 2022-04-17 NOTE — Patient Instructions (Signed)
We gave you a shot today to help with your breathing.  I have sent the prescriptions to your [facility] pharmacy for the albuterol and the stronger Trelegy.  You can take the doxycycline 1 capsule twice a day, I sent another prescription to your [facility] pharmacy as you requested.  We will see you in follow-up in 4 to 6 weeks time call sooner should any new problems arise.

## 2022-04-25 DIAGNOSIS — E559 Vitamin D deficiency, unspecified: Secondary | ICD-10-CM | POA: Diagnosis not present

## 2022-04-26 ENCOUNTER — Other Ambulatory Visit: Payer: Self-pay | Admitting: Internal Medicine

## 2022-04-26 DIAGNOSIS — J449 Chronic obstructive pulmonary disease, unspecified: Secondary | ICD-10-CM

## 2022-04-30 ENCOUNTER — Encounter: Payer: Self-pay | Admitting: Pulmonary Disease

## 2022-05-07 DIAGNOSIS — M79675 Pain in left toe(s): Secondary | ICD-10-CM | POA: Diagnosis not present

## 2022-05-07 DIAGNOSIS — B351 Tinea unguium: Secondary | ICD-10-CM | POA: Diagnosis not present

## 2022-05-07 DIAGNOSIS — M79674 Pain in right toe(s): Secondary | ICD-10-CM | POA: Diagnosis not present

## 2022-05-07 DIAGNOSIS — I87303 Chronic venous hypertension (idiopathic) without complications of bilateral lower extremity: Secondary | ICD-10-CM | POA: Diagnosis not present

## 2022-05-08 DIAGNOSIS — J449 Chronic obstructive pulmonary disease, unspecified: Secondary | ICD-10-CM | POA: Diagnosis not present

## 2022-05-22 ENCOUNTER — Other Ambulatory Visit: Payer: Medicare Other

## 2022-05-27 ENCOUNTER — Ambulatory Visit: Payer: Medicare Other | Admitting: Pulmonary Disease

## 2022-06-06 DIAGNOSIS — J449 Chronic obstructive pulmonary disease, unspecified: Secondary | ICD-10-CM | POA: Diagnosis not present

## 2022-06-12 ENCOUNTER — Ambulatory Visit
Admission: RE | Admit: 2022-06-12 | Discharge: 2022-06-12 | Disposition: A | Discharge status: Home / Self Care | Payer: 59 | Source: Ambulatory Visit | Attending: Internal Medicine | Admitting: Internal Medicine

## 2022-06-12 DIAGNOSIS — Z1231 Encounter for screening mammogram for malignant neoplasm of breast: Secondary | ICD-10-CM | POA: Insufficient documentation

## 2022-06-18 ENCOUNTER — Ambulatory Visit: Payer: Medicare Other | Admitting: Pulmonary Disease

## 2022-06-30 ENCOUNTER — Ambulatory Visit (INDEPENDENT_AMBULATORY_CARE_PROVIDER_SITE_OTHER): Payer: 59 | Admitting: Pulmonary Disease

## 2022-06-30 ENCOUNTER — Encounter: Payer: Self-pay | Admitting: Pulmonary Disease

## 2022-06-30 VITALS — BP 126/82 | HR 82 | Temp 97.0°F | Ht 62.0 in | Wt 250.0 lb

## 2022-06-30 DIAGNOSIS — K766 Portal hypertension: Secondary | ICD-10-CM

## 2022-06-30 DIAGNOSIS — J398 Other specified diseases of upper respiratory tract: Secondary | ICD-10-CM | POA: Diagnosis not present

## 2022-06-30 DIAGNOSIS — I2721 Secondary pulmonary arterial hypertension: Secondary | ICD-10-CM | POA: Diagnosis not present

## 2022-06-30 DIAGNOSIS — I5032 Chronic diastolic (congestive) heart failure: Secondary | ICD-10-CM

## 2022-06-30 DIAGNOSIS — G4736 Sleep related hypoventilation in conditions classified elsewhere: Secondary | ICD-10-CM | POA: Diagnosis not present

## 2022-06-30 DIAGNOSIS — J4489 Other specified chronic obstructive pulmonary disease: Secondary | ICD-10-CM

## 2022-06-30 NOTE — Patient Instructions (Addendum)
On the days for you notice some increased congestion from postnasal drip or allergy you may use Zyrtec over-the-counter.  This would be better than the Claritin.  This is 1 tablet at bedtime.  This should help with those allergies.  Continue taking your Trelegy.  You are doing well.  Your lungs sounded very clear today.  We will see you in follow-up in 4 to 6 months time call sooner should any new problems arise.

## 2022-06-30 NOTE — Progress Notes (Signed)
Subjective:    Patient ID: Lauren Bartlett, female    DOB: 13-Aug-1955, 67 y.o.   MRN: [id number] Patient Care Team: [name], MD as PCP - General (Internal Medicine) Wellington Hampshire, MD as PCP - Cardiology (Cardiology) Tyler Pita, MD as Consulting Physician (Pulmonary Disease)  Requesting MD/Service: Dr. Deborra Medina        Date of initial consultation: 03/07/16 by Dr. Merton Border         Reason for consultation: tracheobronchomalacia, DOE   PT PROFILE: 67 y.o. F never smoker with significant lifetime exposure to second hand smoke with multiple medical problems including chronic hypoxic respiratory failure, moderate COPD, mitral valve prolapse and regurgitation, pulm htn by echocardiogram, appearance of tracheomalacia on CT chest previously followed by Dr Raul Del and referred for further evaluation of severe exertional limitation. Initial eval suggested severe mitral regurgitation and impression was that this was a major contributor to her severe DOE   DATA: Echocardiogram 02/01/13: Normal LVEF. MVP with moderate MR, Moderate AI, severe TI. RVSP est 91 mmHg CT chest 07/29/15: Cardiomegaly with left atrial dilatation. Tracheobronchomalacia. Air trapping in the lungs bilaterally, indicative of small airways disease Echocardiogram 06/13/14: Normal LVEF, Severe MR, RVSP est 62 mmHg Spirometry 03/12/15: FVC 1.94 L (73% pred). FEV1 1.19 L (55% pred), FEV1/FVC 61% 6MWT 03/12/15: 225 meters, no desaturation LE venous US 07/29/15: No DVT Echocardiogram 07/31/15: LVEF 60-65%. MV poorly visualized, trivial MR, LA size normal, TV poorly visualized, RVSP not estimated CT chest 03/11/16: Stable changes of tracheobronchomalacia Echocardiogram 04/15/16: Normal LVEF, Severe MR, moderate PAH Mitral valvuloplasty 07/17/16: @ [place] by Dr Roxy Manns Echocardiogram 09/15/16: LVEF 60-65%. RVSP estimate 54 mmHg ONO 10/21/16: on RA. 45 mins < 89%, lowest SpO2 84% PFTs 03/26/17: FVC: 1.73 > 1.74 L (63  %pred), FEV1: 1.01 > 1.16 L (45> 52%pred, 15% improvement after bronchodilator), FEV1/FVC: 59 >67%, TLC: 3.27 L (73 %pred), DLCO 46 %pred, DLCO/VA 103% 07/24/2020 echocardiogram: LVEF 55 to 60%, mildly elevated pulmonary artery systolic pressure mildly dilated left atrium, prosthetic annuloplasty ring in the mitral position.   INTERVAL:  Last seen by me on 17 April 2022.  At that time she had a minor exacerbation and was treated with Depo-Medrol, doxycycline and Trelegy was increased to the 200 strength.  Did well after that.   Chief Complaint  Patient presents with   Follow-up    No SOB. Wheezing today. No cough.   HPI Patient is a 67 year old very complex lifelong never smoker with chronic asthmatic bronchitis and tracheobronchomalacia.  This is a scheduled visit.  She was last seen on 17 April 2022 and at that time she had a minor exacerbation that was treated with doxycycline Depo-Medrol and had an increase on her dose of Trelegy to 200.  The patient states that since she had those changes instituted, she has done very well.  She notes that she feels the Trelegy is doing better for her.  Today she has noted some "wheezing" and around the neck area.  She does note increased postnasal drip and nasal congestion.  She believes due to season change.  But overall she does not feel any shortness of breath, she has not had any fevers, chills or sweats.  She has not had to use her albuterol rescue of late.  She is compliant with nocturnal oxygen.  She has not had any orthopnea or paroxysmal nocturnal dyspnea.  She does not endorse any other symptomatology.  Overall she feels that she is doing "better".  Review of Systems A 10 point review of systems was performed and it is as noted above otherwise negative.  Patient Active Problem List   Diagnosis Date Noted   Osteopenia determined by x-ray 08/28/2021   Acute bronchitis with COPD 07/11/2021   Unsteady gait when walking 04/29/2021   Aortic  atherosclerosis 09/16/2020   Recurrent epistaxis 06/16/2020   PAF (paroxysmal atrial fibrillation) 12/18/2018   Nocturnal hypoxemia due to obstructive chronic bronchitis 02/06/2018   Encounter for therapeutic drug monitoring 08/13/2016   S/P minimally invasive mitral valve repair 07/17/2016   S/P patent foramen ovale closure 07/17/2016   Chronic diastolic congestive heart failure    COPD (chronic obstructive pulmonary disease) 05/19/2016   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   Vitamin D deficiency 01/11/2015   Allergic rhinitis 07/09/2014   S/P hysterectomy with oophorectomy 09/22/2013   Pulmonary hypertension ([place])    Hypocalcemia 09/10/2012   Hypothyroidism 05/04/2012   Morbid obesity 08/04/2011   Hypertension    Congenital absence of one kidney    History of sciatica     Social History   Tobacco Use   Smoking status: Never   Smokeless tobacco: Never  Substance Use Topics   Alcohol use: No   Allergies  Allergen Reactions   Codeine Hives and Nausea And Vomiting   Levaquin [Levofloxacin] Nausea And Vomiting   Penicillins Hives, Itching and Other (See Comments)    Has patient had a PCN reaction causing immediate rash, facial/tongue/throat swelling, SOB or lightheadedness with hypotension: No Has patient had a PCN reaction causing severe rash involving mucus membranes or skin necrosis: No Has patient had a PCN reaction that required hospitalization No Has patient had a PCN reaction occurring within the last 10 years: No If all of the above answers are "NO", then may proceed with Cephalosporin use.   Zithromax [Azithromycin] Other (See Comments)    Reaction:  Hallucinations    Prednisone Palpitations and Other (See Comments)    Reaction:  Hallucinations  Can take injections but not oral meds   Current Meds  Medication Sig   acetaminophen (TYLENOL) 500 MG tablet Take 500 mg by mouth 2 (two) times daily as needed for moderate pain or headache.   albuterol (PROVENTIL) (2.5 MG/3ML)  0.083% nebulizer solution INHALE THE CONTENTS OF 1 VIAL VIA NEBULIZER EVERY 6 HOURS AS NEEDED FOR WHEEZING  OR FOR SHORTNESS OF BREATH   albuterol (VENTOLIN HFA) 108 (90 Base) MCG/ACT inhaler INHALE 2 INHALATIONS BY  MOUTH INTO THE LUNGS EVERY  6 HOURS AS NEEDED FOR  WHEEZING OR SHORTNESS OF  BREATH (USE WITH SPACER)   aspirin EC 81 MG EC tablet Take 1 tablet (81 mg total) by mouth daily.   benzonatate (TESSALON) 200 MG capsule Take 1 capsule (200 mg total) by mouth 3 (three) times daily as needed for cough.   calcitRIOL (ROCALTROL) 0.25 MCG capsule TAKE 1 CAPSULE BY MOUTH DAILY   Calcium Carb-Cholecalciferol 600-800 MG-UNIT TABS Take 1 tablet by mouth in the morning, at noon, and at bedtime.    docusate sodium (COLACE) 100 MG capsule Take 100 mg by mouth daily.   doxycycline (VIBRA-TABS) 100 MG tablet Take 1 tablet (100 mg total) by mouth 2 (two) times daily.   famotidine (PEPCID AC) 10 MG chewable tablet Chew 20 mg by mouth daily as needed for heartburn.    ferrous sulfate 325 (65 FE) MG tablet Take 325 mg by mouth daily with breakfast.   Fluticasone-Umeclidin-Vilant (TRELEGY ELLIPTA) 200-62.5-25 MCG/ACT AEPB Inhale 1 puff into the lungs  daily.   furosemide (LASIX) 20 MG tablet TAKE 1 TABLET BY MOUTH DAILY .  MAY TAKE AN ADDITIONAL TABLET AS NEEDED FOR WEIGHT GAIN OF 2 LBS  OVERNIGHT OR 5 LBS IN 1 WEEK   levothyroxine (SYNTHROID) 150 MCG tablet Take 1 tablet (150 mcg total) by mouth daily before breakfast.   lidocaine (LIDODERM) 5 % Place 1 patch onto the skin every 12 (twelve) hours. Remove & Discard patch within 12 hours or as directed by MD   loratadine (CLARITIN) 10 MG tablet Take 10 mg by mouth daily.   losartan (COZAAR) 50 MG tablet Take 1 tablet (50 mg total) by mouth daily.   OXYGEN Inhale 2 L into the lungs at bedtime as needed (at bedtime and as needed).   oxymetazoline (AFRIN) 0.05 % nasal spray Place 1 spray into both nostrils 2 (two) times daily. As needed for nose bleed    polyethylene glycol powder (GLYCOLAX/MIRALAX) powder Take 17 g by mouth 2 (two) times daily as needed.   traMADol (ULTRAM) 50 MG tablet Take 1-2 tablets (50-100 mg total) by mouth every 4 (four) hours as needed for moderate pain.   vitamin B-12 (CYANOCOBALAMIN) 1000 MCG tablet Take 1 tablet (1,000 mcg total) by mouth daily.   Vitamin D, Ergocalciferol, (DRISDOL) 1.25 MG (50000 UNIT) CAPS capsule TAKE 1 CAPSULE BY MOUTH 1 TIME A WEEK       Objective:   Physical Exam BP 126/82 (BP Location: Left Wrist, Cuff Size: Normal)   Pulse 82   Temp (!) 97 F (36.1 C)   Ht 5\' 2"  (1.575 m)   Wt 250 lb (113.4 kg) Comment: per patient. in a wheelchair today  SpO2 96%   BMI 45.73 kg/m   SpO2: 96 %. RA  GENERAL: Obese woman, no acute distress, presents in transport chair.  No conversational dyspnea.  Looks older than stated age. HEAD: Normocephalic, atraumatic.  EYES: Pupils equal, round, reactive to light.  No scleral icterus.  MOUTH: Oral mucosa moist.  No thrush. NECK: Supple. No thyromegaly. Trachea midline. No JVD.  No adenopathy. PULMONARY: Good air entry bilaterally.  No adventitious sounds noted today. CARDIOVASCULAR: S1 and S2. Regular rate and rhythm.  Accentuated P2. ABDOMEN: Obese otherwise benign. MUSCULOSKELETAL: No joint deformity, no clubbing, no edema.  NEUROLOGIC: No overt focal deficit, gait not tested patient presents in transport chair. SKIN: Intact,warm,dry. PSYCH: Flat affect.       Assessment & Plan:     ICD-10-CM   1. Chronic asthmatic bronchitis  J44.89    Continue Trelegy Continue as needed albuterol Appears well compensated    2. Tracheomalacia  J39.8    This issue adds complexity to her management Adds exercise limitation to the patient    3. Nocturnal hypoxemia due to obstructive chronic bronchitis  J44.89    G47.36    Patient compliant with oxygen therapy Continue oxygen at 2 L/min nocturnally    4. PAH (pulmonary arterial hypertension) with portal  hypertension  I27.21    K76.6    This issue adds complexity to her management Continue nocturnal oxygen supplementation Continue management per cardiology    5. Chronic diastolic congestive heart failure  I50.32    This issue adds complexity to her management Continue follow-up with cardiology     Overall Lauren Bartlett appears to be well compensated.  She is to continue her current regimen.  We will see her in follow-up in 4 to 6 months time she is to contact us prior to that time should  any new difficulties arise.   Renold Don, MD Advanced Bronchoscopy PCCM [facility]    *This note was dictated using voice recognition software/Dragon.  Despite best efforts to proofread, errors can occur which can change the meaning. Any transcriptional errors that result from this process are unintentional and may not be fully corrected at the time of dictation.

## 2022-07-04 ENCOUNTER — Ambulatory Visit: Payer: Medicare Other | Admitting: Internal Medicine

## 2022-07-07 DIAGNOSIS — J449 Chronic obstructive pulmonary disease, unspecified: Secondary | ICD-10-CM | POA: Diagnosis not present

## 2022-07-09 ENCOUNTER — Ambulatory Visit (INDEPENDENT_AMBULATORY_CARE_PROVIDER_SITE_OTHER): Payer: 59

## 2022-07-09 ENCOUNTER — Other Ambulatory Visit: Payer: Self-pay

## 2022-07-09 ENCOUNTER — Encounter: Payer: Self-pay | Admitting: Emergency Medicine

## 2022-07-09 ENCOUNTER — Ambulatory Visit
Admission: EM | Admit: 2022-07-09 | Discharge: 2022-07-09 | Disposition: A | Discharge status: Home / Self Care | Payer: 59 | Attending: Family Medicine | Admitting: Family Medicine

## 2022-07-09 DIAGNOSIS — J441 Chronic obstructive pulmonary disease with (acute) exacerbation: Secondary | ICD-10-CM | POA: Diagnosis not present

## 2022-07-09 DIAGNOSIS — R059 Cough, unspecified: Secondary | ICD-10-CM

## 2022-07-09 DIAGNOSIS — J9621 Acute and chronic respiratory failure with hypoxia: Secondary | ICD-10-CM

## 2022-07-09 DIAGNOSIS — R062 Wheezing: Secondary | ICD-10-CM

## 2022-07-09 DIAGNOSIS — R0602 Shortness of breath: Secondary | ICD-10-CM | POA: Diagnosis not present

## 2022-07-09 MED ORDER — DEXAMETHASONE SODIUM PHOSPHATE 10 MG/ML IJ SOLN
10.0000 mg | Freq: Once | INTRAMUSCULAR | Status: AC
  Administered 2022-07-09: 10 mg via INTRAMUSCULAR

## 2022-07-09 MED ORDER — DOXYCYCLINE HYCLATE 100 MG PO TABS: 100.0000 mg | ORAL_TABLET | Freq: Two times a day (BID) | ORAL | 0 refills | Status: DC

## 2022-07-09 MED ORDER — IPRATROPIUM-ALBUTEROL 0.5-2.5 (3) MG/3ML IN SOLN
3.0000 mL | Freq: Once | RESPIRATORY_TRACT | Status: AC
  Administered 2022-07-09: 3 mL via RESPIRATORY_TRACT

## 2022-07-09 NOTE — ED Provider Notes (Signed)
[facility]    CSN: [id number] Arrival date & time: 07/09/22  1236      History   Chief Complaint Chief Complaint  Patient presents with   Cough   Shortness of Breath    HPI Lauren Bartlett is a 67 y.o. female.   HPI   Lauren Bartlett presents for cough with wheezing and shortness of breath.Uses 2 L O2 at night at baseline but has been having to use her oxygen throughout the day for the last couple of days.  She is having a productive cough with yellow-green sputum.  She requested a steroid injection.  Fever, nausea, vomiting, diarrhea.  She does note that she is hoarse and has some nasal congestion.  She follows with pulmonology.      Past Medical History:  Diagnosis Date   Anxiety    Asthmatic bronchitis , chronic    Bleeding from the nose    Chronic diastolic congestive heart failure    a. 03/2016 Echo: EF 55-60%, Gr2 DD; b. 08/2016 Echo: EF 60-65%, nl diast fxn; c. 01/2019 Echo: EF 55-60%. Nl RV fxn.   Congenital absence of one kidney    COPD (chronic obstructive pulmonary disease)    a. 02/2017 PFT: mod/sev obstructive airway dzs w/ significant bronchodilator response.   Dep[facility]    [facility]   Dyspnea    Emphysema/COPD    GERD (gastroesophageal reflux disease)    History of cardiac cath    a. 05/2016 Cath: nl cors.   History of sciatica    Hypertension    Hypothyroidism    Mitral Valve Prolapse & Severe mitral regurgitation s/p MVR    a. 03/2016 Echo: severe MVP involving the posterior leaflet, sev MR;  b. 06/2016 min invasive MV repair w/ triangular resection of flail segment (P2) of posterior leaflet, gore-tex neochord placement x 4, Sorin Memo 3D rin Annulosplasty (26mm, catalog # [id number], ser # [id number]); c. 08/2016 Echo: MV area 2.06 cm^2 (pressure 1/2 time); d. 01/2019 Echo: Mild MS w/ mean grad .   Motion sickness    cars   Pneumonia    PONV (postoperative nausea and vomiting)    Post-op Afib    a. 06/2016 following MV  repair-->short-course amio. Coumadin d/c'd 2/2 epistaxis.   Pulmonary hypertension    a. 08/2016 Echo: PASP ; b. 01/2019 Echo: RVSP 45.77mmHg.   S/P patent foramen ovale closure    a. 06/2016 @ time of MV Repair.   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    Patient Active Problem List   Diagnosis Date Noted   Osteopenia determined by x-ray 08/28/2021   Acute bronchitis with COPD 07/11/2021   Unsteady gait when walking 04/29/2021   Aortic atherosclerosis 09/16/2020   Recurrent epistaxis 06/16/2020   PAF (paroxysmal atrial fibrillation) 12/18/2018   Nocturnal hypoxemia due to obstructive chronic bronchitis 02/06/2018   Encounter for therapeutic drug monitoring 08/13/2016   S/P minimally invasive mitral valve repair 07/17/2016   S/P patent foramen ovale closure 07/17/2016   Chronic diastolic congestive heart failure    COPD (chronic obstructive pulmonary disease) 05/19/2016   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   Vitamin D deficiency 01/11/2015   Allergic rhinitis 07/09/2014   S/P hysterectomy with oophorectomy 09/22/2013   Pulmonary hypertension (HCC)    Hypocalcemia 09/10/2012   Hypothyroidism 05/04/2012   Morbid obesity 08/04/2011   Hypertension    Congenital absence of one kidney    History of sciatica  Past Surgical History:  Procedure Laterality Date   ABDOMINAL HYSTERECTOMY     BREAST BIOPSY Right 2011   [facility]< benign   CATARACT EXTRACTION W/PHACO Left 11/14/2015   Procedure: CATARACT EXTRACTION PHACO AND INTRAOCULAR LENS PLACEMENT (IOC);  Surgeon: Lockie Molahadwick Brasington, MD;  [facility];  Service: Ophthalmology;  Laterality: Left;  sleep apnea Toric   CATARACT EXTRACTION W/PHACO Right 12/19/2015   Procedure: CATARACT EXTRACTION PHACO AND INTRAOCULAR LENS PLACEMENT (IOC);  Surgeon: Lockie Molahadwick Brasington, MD;  [facility];  Service: Ophthalmology;  Laterality: Right;  ANXIETY GENEROUS IV SEDATION TORIC LEN   COMBINED  HYSTERECTOMY ABDOMINAL W/ A&P REPAIR / OOPHORECTOMY  1996   benign tumor   INNER EAR SURGERY     bilateral   MITRAL VALVE REPAIR Right 07/17/2016   Procedure: MINIMALLY INVASIVE MITRAL VALVE REPAIR (MVR) USING 26 SORIN MEMO 3D ANNULOPLASTY RING;  Surgeon: Purcell Nailslarence H Owen, MD;  Location: [facility] OR;  Service: Open Heart Surgery;  Laterality: Right;   PATENT FORAMEN OVALE(PFO) CLOSURE N/A 07/17/2016   Procedure: PATENT FORAMEN OVALE (PFO) CLOSURE;  Surgeon: Purcell Nailslarence H Owen, MD;  Location: [facility] OR;  Service: Open Heart Surgery;  Laterality: N/A;   RIGHT/LEFT HEART CATH AND CORONARY ANGIOGRAPHY Bilateral 06/16/2016   Procedure: Right/Left Heart Cath and Coronary Angiography;  Surgeon: Iran OuchMuhammad A Arida, MD;  Location: [facility];  Service: Cardiovascular;  Laterality: Bilateral;   TEE WITHOUT CARDIOVERSION N/A 05/19/2016   Procedure: TRANSESOPHAGEAL ECHOCARDIOGRAM (TEE);  Surgeon: Iran OuchMuhammad A Arida, MD;  Location: [facility] ORS;  Service: Cardiovascular;  Laterality: N/A;   TEE WITHOUT CARDIOVERSION N/A 06/09/2016   Procedure: TRANSESOPHAGEAL ECHOCARDIOGRAM (TEE);  Surgeon: Iran OuchMuhammad A Arida, MD;  Location: [facility] OR[facility];  Service: Cardiovascular;  Laterality: N/A;   TEE WITHOUT CARDIOVERSION N/A 06/16/2016   Procedure: TRANSESOPHAGEAL ECHOCARDIOGRAM (TEE);  Surgeon: Iran OuchMuhammad A Arida, MD;  Location: [facility] OR[facility];  Service: Cardiovascular;  Laterality: N/A;   TEE WITHOUT CARDIOVERSION N/A 07/17/2016   Procedure: TRANSESOPHAGEAL ECHOCARDIOGRAM (TEE);  Surgeon: Purcell Nailslarence H Owen, MD;  [facility];  Service: Open Heart Surgery;  Laterality: N/A;   TONSILLECTOMY      OB History   No obstetric history on file.      Home Medications    Prior to Admission medications   Medication Sig Start Date End Date Taking? Authorizing Provider  acetaminophen (TYLENOL) 500 MG tablet Take 500 mg by mouth 2 (two) times daily as needed for moderate pain or headache.    [provider]  albuterol (PROVENTIL) (2.5  MG/3ML) 0.083% nebulizer solution INHALE THE CONTENTS OF 1 VIAL VIA NEBULIZER EVERY 6 HOURS AS NEEDED FOR WHEEZING  OR FOR SHORTNESS OF BREATH 04/17/22   Salena SanerGonzalez, Carmen L, MD  albuterol (VENTOLIN HFA) 108 (90 Base) MCG/ACT inhaler INHALE 2 INHALATIONS BY  MOUTH INTO THE LUNGS EVERY  6 HOURS AS NEEDED FOR  WHEEZING OR SHORTNESS OF  BREATH (USE WITH SPACER) 03/04/22   Sherlene Shamsullo, Teresa L, MD  aspirin EC 81 MG EC tablet Take 1 tablet (81 mg total) by mouth daily. 07/23/16   Barrett, Erin R, PA-C  benzonatate (TESSALON) 200 MG capsule Take 1 capsule (200 mg total) by mouth 3 (three) times daily as needed for cough. 07/11/21   Sherlene Shamsullo, Teresa L, MD  calcitRIOL (ROCALTROL) 0.25 MCG capsule TAKE 1 CAPSULE BY MOUTH DAILY 09/15/21   Worthy RancherWebb, Padonda B, FNP  Calcium Carb-Cholecalciferol 600-800 MG-UNIT TABS Take 1 tablet by mouth in the morning, at noon, and at bedtime.  [provider]  docusate sodium (COLACE) 100 MG capsule Take 100 mg by mouth daily.    [provider]  doxycycline (VIBRA-TABS) 100 MG tablet Take 1 tablet (100 mg total) by mouth 2 (two) times daily. 07/09/22   Katha Cabal, DO  famotidine (PEPCID AC) 10 MG chewable tablet Chew 20 mg by mouth daily as needed for heartburn.     [provider]  ferrous sulfate 325 (65 FE) MG tablet Take 325 mg by mouth daily with breakfast.    [provider]  Fluticasone-Umeclidin-Vilant (TRELEGY ELLIPTA) 200-62.5-25 MCG/ACT AEPB Inhale 1 puff into the lungs daily. 04/17/22   Salena Saner, MD  Fluticasone-Umeclidin-Vilant (TRELEGY ELLIPTA) 200-62.5-25 MCG/ACT AEPB Inhale 1 puff into the lungs daily. Patient not taking: Reported on 06/30/2022 04/17/22   Salena Saner, MD  furosemide (LASIX) 20 MG tablet TAKE 1 TABLET BY MOUTH DAILY .  MAY TAKE AN ADDITIONAL TABLET AS NEEDED FOR WEIGHT GAIN OF 2 LBS  OVERNIGHT OR 5 LBS IN 1 WEEK 04/28/22   Sherlene Shams, MD  levothyroxine (SYNTHROID) 150 MCG tablet Take 1 tablet (150 mcg  total) by mouth daily before breakfast. 02/06/22   Sherlene Shams, MD  lidocaine (LIDODERM) 5 % Place 1 patch onto the skin every 12 (twelve) hours. Remove & Discard patch within 12 hours or as directed by MD 06/18/21   Sharman Cheek, MD  loratadine (CLARITIN) 10 MG tablet Take 10 mg by mouth daily.    [provider]  losartan (COZAAR) 50 MG tablet Take 1 tablet (50 mg total) by mouth daily. 02/06/22   Sherlene Shams, MD  OXYGEN Inhale 2 L into the lungs at bedtime as needed (at bedtime and as needed).    [provider]  oxymetazoline (AFRIN) 0.05 % nasal spray Place 1 spray into both nostrils 2 (two) times daily. As needed for nose bleed    [provider]  polyethylene glycol powder (GLYCOLAX/MIRALAX) powder Take 17 g by mouth 2 (two) times daily as needed. 08/03/15   Sherlene Shams, MD  traMADol (ULTRAM) 50 MG tablet Take 1-2 tablets (50-100 mg total) by mouth every 4 (four) hours as needed for moderate pain. 08/21/16   Sherlene Shams, MD  vitamin B-12 (CYANOCOBALAMIN) 1000 MCG tablet Take 1 tablet (1,000 mcg total) by mouth daily. 08/14/16   Sherlene Shams, MD  Vitamin D, Ergocalciferol, (DRISDOL) 1.25 MG (50000 UNIT) CAPS capsule TAKE 1 CAPSULE BY MOUTH 1 TIME A WEEK 11/21/21   Sherlene Shams, MD    Family History Family History  Problem Relation Age of Onset   Multiple sclerosis Mother    Hypertension Mother    Coronary artery disease Father    Heart disease Father    Hypertension Father    Heart disease Brother    Cancer Brother    Breast cancer Paternal Aunt     Social History Social History   Tobacco Use   Smoking status: Never   Smokeless tobacco: Never  Vaping Use   Vaping Use: Never used  Substance Use Topics   Alcohol use: No   Drug use: No     Allergies   Codeine, Levaquin [levofloxacin], Penicillins, Zithromax [azithromycin], and Prednisone   Review of Systems Review of Systems: negative unless otherwise stated in HPI.       Physical Exam Triage Vital Signs ED Triage Vitals  Enc Vitals Group     BP 07/09/22 1242 137/84     Pulse  Rate 07/09/22 1242 (!) 111     Resp 07/09/22 1242 20     Temp 07/09/22 1242 98.4 F (36.9 C)     Temp Source 07/09/22 1242 Oral     SpO2 07/09/22 1242 90 %     Weight --      Height --      Head Circumference --      Peak Flow --      Pain Score 07/09/22 1241 0     Pain Loc --      Pain Edu? --      Excl. in GC? --    No data found.  Updated Vital Signs BP 137/84 (BP Location: Left Arm)   Pulse (!) 111   Temp 98.4 F (36.9 C) (Oral)   Resp 20   SpO2 90%   Visual Acuity Right Eye Distance:   Left Eye Distance:   Bilateral Distance:    Right Eye Near:   Left Eye Near:    Bilateral Near:     Physical Exam GEN:     alert, chronically ill appearing female in no distress    HENT:  mucus membranes moist, oropharyngeal without erythema, lesions or exudate, no tonsillar hypertrophy, clear nasal discharge EYES:   pupils equal and reactive, no scleral injection or discharge NECK:  normal ROM RESP:  no increased work of breathing, diffuse coarse breath sounds with expiratory wheezing CVS:   regular rhythm, tachycardic Skin:   warm and dry   UC Treatments / Results  Labs (all labs ordered are listed, but only abnormal results are displayed) Labs Reviewed - No data to display  EKG   Radiology DG Chest 2 View  Result Date: 07/09/2022 CLINICAL DATA:  Cough, shortness of breath, wheezing EXAM: CHEST - 2 VIEW COMPARISON:  04/03/2022 FINDINGS: The heart size and mediastinal contours are within normal limits. Unchanged severe elevation of the right hemidiaphragm with associated scarring or atelectasis. Mild diffuse bilateral interstitial opacity. Disc degenerative disease of the thoracic spine. IMPRESSION: 1. Unchanged severe elevation of the right hemidiaphragm with associated scarring or atelectasis. 2. Mild diffuse bilateral interstitial opacity, likely edema or  atypical/viral infection. No focal airspace opacity. Electronically Signed   By: Jearld Lesch M.D.   On: 07/09/2022 14:15     Procedures Procedures (including critical care time)  Medications Ordered in UC Medications  ipratropium-albuterol (DUONEB) 0.5-2.5 (3) MG/3ML nebulizer solution 3 mL (3 mLs Nebulization Given 07/09/22 1345)  dexamethasone (DECADRON) injection 10 mg (10 mg Intramuscular Given 07/09/22 1356)    Initial Impression / Assessment and Plan / UC Course  I have reviewed the triage vital signs and the nursing notes.  Pertinent labs & imaging results that were available during my care of the patient were reviewed by me and considered in my medical decision making (see chart for details).       Pt is a 67 y.o. female who presents for  respiratory symptoms. Lauren Bartlett is afebrile here without recent antipyretics. She is tachycardic. Satting 90% on room air. Overall pt is chronically-ill appearing, well hydrated, without respiratory distress. Pulmonary exam is remarkable for coarse breath sounds and expiratory wheezing.  Chest xray personally reviewed by me without  pleural effusion, cardiomegaly or pneumothorax.  Radiologist notes bilateral interstitial opacity likely atypical versus viral infection.  Given 10 mg IM Decadron and a DuoNeb breathing treatment with some improvement of her lung sounds.  We were able to wean her off oxygen here.  Unfortunately she has allergies to  penicillins, azithromycin, Levaquin.  Prescribed doxycycline twice daily for 7 days.  Turn and ED precautions given and patient voiced understanding. Typical duration of symptoms discussed.   Return and ED precautions given and voiced understanding. Discussed MDM, treatment plan and plan for follow-up with patient who agrees with plan.     Final Clinical Impressions(s) / UC Diagnoses   Final diagnoses:  COPD exacerbation  Acute on chronic respiratory failure with hypoxia     Discharge Instructions       You were given a breathing treatment here.  Your chest x-ray did not show pneumonia.  I suspect you are having a exacerbation of COPD.  You are given a injection and steroid that will help your symptoms.  Stop by the pharmacy to pick up your antibiotics.     ED Prescriptions     Medication Sig Dispense Auth. Provider   doxycycline (VIBRA-TABS) 100 MG tablet Take 1 tablet (100 mg total) by mouth 2 (two) times daily. 14 tablet Harley Fitzwater, Seward Meth, DO      PDMP not reviewed this encounter.   Katha Cabal, DO 07/14/22 1239

## 2022-07-09 NOTE — Discharge Instructions (Signed)
You were given a breathing treatment here.  Your chest x-ray did not show pneumonia.  I suspect you are having a exacerbation of COPD.  You are given a injection and steroid that will help your symptoms.  Stop by the pharmacy to pick up your antibiotics.

## 2022-07-09 NOTE — ED Triage Notes (Signed)
Pt presents with a productive cough, SOB and wheezing x 3 days. Pt has a history of COPD and is on 2 liter of O2 at night. Pt has tried breathing treatments and her inhaler with no relief.

## 2022-07-14 ENCOUNTER — Ambulatory Visit
Admission: EM | Admit: 2022-07-14 | Discharge: 2022-07-14 | Disposition: A | Discharge status: Home / Self Care | Payer: 59 | Attending: Family Medicine | Admitting: Family Medicine

## 2022-07-14 DIAGNOSIS — J441 Chronic obstructive pulmonary disease with (acute) exacerbation: Secondary | ICD-10-CM | POA: Diagnosis not present

## 2022-07-14 MED ORDER — DEXAMETHASONE SODIUM PHOSPHATE 10 MG/ML IJ SOLN
10.0000 mg | Freq: Once | INTRAMUSCULAR | Status: AC
  Administered 2022-07-14: 10 mg via INTRAMUSCULAR

## 2022-07-14 MED ORDER — IPRATROPIUM-ALBUTEROL 0.5-2.5 (3) MG/3ML IN SOLN
3.0000 mL | Freq: Once | RESPIRATORY_TRACT | Status: AC
  Administered 2022-07-14: 3 mL via RESPIRATORY_TRACT

## 2022-07-14 NOTE — ED Provider Notes (Signed)
[facility]    CSN: [id number] Arrival date & time: 07/14/22  1053      History   Chief Complaint Chief Complaint  Patient presents with   Shortness of Breath   Hoarse    HPI Lauren Bartlett is a 67 y.o. female.   HPI   Lauren Bartlett presents for continued shortness of breath.  Her symptoms have improved since she was seen in the urgent care on 07/09/2022.  She feels like she needs another steroid injection.  She uses 2 L of oxygen at night but has been needing it throughout the day.  States that on Saturday she had a bad day and needed to use it more than she normally does.  Yesterday she did not need to use it until bedtime which is when she normally uses it.  Requests a steroid injection.        Past Medical History:  Diagnosis Date   Anxiety    Asthmatic bronchitis , chronic    Bleeding from the nose    Chronic diastolic congestive heart failure    a. 03/2016 Echo: EF 55-60%, Gr2 DD; b. 08/2016 Echo: EF 60-65%, nl diast fxn; c. 01/2019 Echo: EF 55-60%. Nl RV fxn.   Congenital absence of one kidney    COPD (chronic obstructive pulmonary disease)    a. 02/2017 PFT: mod/sev obstructive airway dzs w/ significant bronchodilator response.   Dep[facility]    [facility]   Dyspnea    Emphysema/COPD    GERD (gastroesophageal reflux disease)    History of cardiac cath    a. 05/2016 Cath: nl cors.   History of sciatica    Hypertension    Hypothyroidism    Mitral Valve Prolapse & Severe mitral regurgitation s/p MVR    a. 03/2016 Echo: severe MVP involving the posterior leaflet, sev MR;  b. 06/2016 min invasive MV repair w/ triangular resection of flail segment (P2) of posterior leaflet, gore-tex neochord placement x 4, Sorin Memo 3D rin Annulosplasty (26mm, catalog # [id number], ser # [id number]); c. 08/2016 Echo: MV area 2.06 cm^2 (pressure 1/2 time); d. 01/2019 Echo: Mild MS w/ mean grad .   Motion sickness    cars   Pneumonia    PONV (postoperative nausea and  vomiting)    Post-op Afib    a. 06/2016 following MV repair-->short-course amio. Coumadin d/c'd 2/2 epistaxis.   Pulmonary hypertension    a. 08/2016 Echo: PASP ; b. 01/2019 Echo: RVSP 45.54mmHg.   S/P patent foramen ovale closure    a. 06/2016 @ time of MV Repair.   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    Patient Active Problem List   Diagnosis Date Noted   Osteopenia determined by x-ray 08/28/2021   Acute bronchitis with COPD 07/11/2021   Unsteady gait when walking 04/29/2021   Aortic atherosclerosis 09/16/2020   Recurrent epistaxis 06/16/2020   PAF (paroxysmal atrial fibrillation) 12/18/2018   Nocturnal hypoxemia due to obstructive chronic bronchitis 02/06/2018   Encounter for therapeutic drug monitoring 08/13/2016   S/P minimally invasive mitral valve repair 07/17/2016   S/P patent foramen ovale closure 07/17/2016   Chronic diastolic congestive heart failure    COPD (chronic obstructive pulmonary disease) 05/19/2016   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   Vitamin D deficiency 01/11/2015   Allergic rhinitis 07/09/2014   S/P hysterectomy with oophorectomy 09/22/2013   Pulmonary hypertension (HCC)    Hypocalcemia 09/10/2012   Hypothyroidism 05/04/2012   Morbid obesity  08/04/2011   Hypertension    Congenital absence of one kidney    History of sciatica     Past Surgical History:  Procedure Laterality Date   ABDOMINAL HYSTERECTOMY     BREAST BIOPSY Right 2011   [facility]< benign   CATARACT EXTRACTION W/PHACO Left 11/14/2015   Procedure: CATARACT EXTRACTION PHACO AND INTRAOCULAR LENS PLACEMENT (IOC);  Surgeon: Lockie Mola, MD;  Location: [facility];  Service: Ophthalmology;  Laterality: Left;  sleep apnea Toric   CATARACT EXTRACTION W/PHACO Right 12/19/2015   Procedure: CATARACT EXTRACTION PHACO AND INTRAOCULAR LENS PLACEMENT (IOC);  Surgeon: Lockie Mola, MD;  Location: [facility];  Service: Ophthalmology;  Laterality: Right;   ANXIETY GENEROUS IV SEDATION TORIC LEN   COMBINED HYSTERECTOMY ABDOMINAL W/ A&P REPAIR / OOPHORECTOMY  1996   benign tumor   INNER EAR SURGERY     bilateral   MITRAL VALVE REPAIR Right 07/17/2016   Procedure: MINIMALLY INVASIVE MITRAL VALVE REPAIR (MVR) USING 26 SORIN MEMO 3D ANNULOPLASTY RING;  Surgeon: Purcell Nails, MD;  Location: [facility] OR;  Service: Open Heart Surgery;  Laterality: Right;   PATENT FORAMEN OVALE(PFO) CLOSURE N/A 07/17/2016   Procedure: PATENT FORAMEN OVALE (PFO) CLOSURE;  Surgeon: Purcell Nails, MD;  Location: [facility] OR;  Service: Open Heart Surgery;  Laterality: N/A;   RIGHT/LEFT HEART CATH AND CORONARY ANGIOGRAPHY Bilateral 06/16/2016   Procedure: Right/Left Heart Cath and Coronary Angiography;  Surgeon: Iran Ouch, MD;  Location: [facility];  Service: Cardiovascular;  Laterality: Bilateral;   TEE WITHOUT CARDIOVERSION N/A 05/19/2016   Procedure: TRANSESOPHAGEAL ECHOCARDIOGRAM (TEE);  Surgeon: Iran Ouch, MD;  Location: [facility];  Service: Cardiovascular;  Laterality: N/A;   TEE WITHOUT CARDIOVERSION N/A 06/09/2016   Procedure: TRANSESOPHAGEAL ECHOCARDIOGRAM (TEE);  Surgeon: Iran Ouch, MD;  Location: [facility];  Service: Cardiovascular;  Laterality: N/A;   TEE WITHOUT CARDIOVERSION N/A 06/16/2016   Procedure: TRANSESOPHAGEAL ECHOCARDIOGRAM (TEE);  Surgeon: Iran Ouch, MD;  Location: [facility];  Service: Cardiovascular;  Laterality: N/A;   TEE WITHOUT CARDIOVERSION N/A 07/17/2016   Procedure: TRANSESOPHAGEAL ECHOCARDIOGRAM (TEE);  Surgeon: Purcell Nails, MD;  Location: [facility] OR;  Service: Open Heart Surgery;  Laterality: N/A;   TONSILLECTOMY      OB History   No obstetric history on file.      Home Medications    Prior to Admission medications   Medication Sig Start Date End Date Taking? Authorizing Provider  acetaminophen (TYLENOL) 500 MG tablet Take 500 mg by mouth 2 (two) times daily as needed for moderate pain or headache.   Yes  [provider]  albuterol (PROVENTIL) (2.5 MG/3ML) 0.083% nebulizer solution INHALE THE CONTENTS OF 1 VIAL VIA NEBULIZER EVERY 6 HOURS AS NEEDED FOR WHEEZING  OR FOR SHORTNESS OF BREATH 04/17/22  Yes Salena Saner, MD  albuterol (VENTOLIN HFA) 108 (90 Base) MCG/ACT inhaler INHALE 2 INHALATIONS BY  MOUTH INTO THE LUNGS EVERY  6 HOURS AS NEEDED FOR  WHEEZING OR SHORTNESS OF  BREATH (USE WITH SPACER) 03/04/22  Yes Sherlene Shams, MD  aspirin EC 81 MG EC tablet Take 1 tablet (81 mg total) by mouth daily. 07/23/16  Yes Barrett, Erin R, PA-C  benzonatate (TESSALON) 200 MG capsule Take 1 capsule (200 mg total) by mouth 3 (three) times daily as needed for cough. 07/11/21  Yes Sherlene Shams, MD  calcitRIOL (ROCALTROL) 0.25 MCG capsule TAKE 1 CAPSULE BY MOUTH DAILY 09/15/21  Yes Worthy Rancher B,  FNP  Calcium Carb-Cholecalciferol 600-800 MG-UNIT TABS Take 1 tablet by mouth in the morning, at noon, and at bedtime.    Yes [provider]  docusate sodium (COLACE) 100 MG capsule Take 100 mg by mouth daily.   Yes [provider]  doxycycline (VIBRA-TABS) 100 MG tablet Take 1 tablet (100 mg total) by mouth 2 (two) times daily. 07/09/22  Yes Arelyn Gauer, DO  famotidine (PEPCID AC) 10 MG chewable tablet Chew 20 mg by mouth daily as needed for heartburn.    Yes [provider]  ferrous sulfate 325 (65 FE) MG tablet Take 325 mg by mouth daily with breakfast.   Yes [provider]  Fluticasone-Umeclidin-Vilant (TRELEGY ELLIPTA) 200-62.5-25 MCG/ACT AEPB Inhale 1 puff into the lungs daily. 04/17/22  Yes Salena Saner, MD  furosemide (LASIX) 20 MG tablet TAKE 1 TABLET BY MOUTH DAILY .  MAY TAKE AN ADDITIONAL TABLET AS NEEDED FOR WEIGHT GAIN OF 2 LBS  OVERNIGHT OR 5 LBS IN 1 WEEK 04/28/22  Yes Sherlene Shams, MD  levothyroxine (SYNTHROID) 150 MCG tablet Take 1 tablet (150 mcg total) by mouth daily before breakfast. 02/06/22  Yes Sherlene Shams, MD  lidocaine (LIDODERM) 5  % Place 1 patch onto the skin every 12 (twelve) hours. Remove & Discard patch within 12 hours or as directed by MD 06/18/21  Yes Sharman Cheek, MD  loratadine (CLARITIN) 10 MG tablet Take 10 mg by mouth daily.   Yes [provider]  losartan (COZAAR) 50 MG tablet Take 1 tablet (50 mg total) by mouth daily. 02/06/22  Yes Sherlene Shams, MD  OXYGEN Inhale 2 L into the lungs at bedtime as needed (at bedtime and as needed).   Yes [provider]  oxymetazoline (AFRIN) 0.05 % nasal spray Place 1 spray into both nostrils 2 (two) times daily. As needed for nose bleed   Yes [provider]  polyethylene glycol powder (GLYCOLAX/MIRALAX) powder Take 17 g by mouth 2 (two) times daily as needed. 08/03/15  Yes Sherlene Shams, MD  traMADol (ULTRAM) 50 MG tablet Take 1-2 tablets (50-100 mg total) by mouth every 4 (four) hours as needed for moderate pain. 08/21/16  Yes Sherlene Shams, MD  vitamin B-12 (CYANOCOBALAMIN) 1000 MCG tablet Take 1 tablet (1,000 mcg total) by mouth daily. 08/14/16  Yes Sherlene Shams, MD  Vitamin D, Ergocalciferol, (DRISDOL) 1.25 MG (50000 UNIT) CAPS capsule TAKE 1 CAPSULE BY MOUTH 1 TIME A WEEK 11/21/21  Yes Sherlene Shams, MD  Fluticasone-Umeclidin-Vilant (TRELEGY ELLIPTA) 200-62.5-25 MCG/ACT AEPB Inhale 1 puff into the lungs daily. Patient not taking: Reported on 06/30/2022 04/17/22   Salena Saner, MD    Family History Family History  Problem Relation Age of Onset   Multiple sclerosis Mother    Hypertension Mother    Coronary artery disease Father    Heart disease Father    Hypertension Father    Heart disease Brother    Cancer Brother    Breast cancer Paternal Aunt     Social History Social History   Tobacco Use   Smoking status: Never   Smokeless tobacco: Never  Vaping Use   Vaping Use: Never used  Substance Use Topics   Alcohol use: No   Drug use: No     Allergies   Codeine, Levaquin [levofloxacin], Penicillins, Zithromax  [azithromycin], and Prednisone   Review of Systems Review of Systems: negative unless otherwise stated in HPI.      Physical  Exam Triage Vital Signs ED Triage Vitals  Enc Vitals Group     BP 07/14/22 1119 (!) 137/57     Pulse Rate 07/14/22 1119 91     Resp 07/14/22 1119 16     Temp 07/14/22 1119 98.2 F (36.8 C)     Temp Source 07/14/22 1119 Oral     SpO2 07/14/22 1119 94 %     Weight 07/14/22 1128 250 lb (113.4 kg)     Height 07/14/22 1128  (1.575 m)     Head Circumference --      Peak Flow --      Pain Score 07/14/22 1128 0     Pain Loc --      Pain Edu? --      Excl. in GC? --    No data found.  Updated Vital Signs BP (!) 137/57 (BP Location: Left Arm)   Pulse 91   Temp 98.2 F (36.8 C) (Oral)   Resp 16   Ht  (1.575 m)   Wt 113.4 kg   SpO2 94%   BMI 45.73 kg/m   Visual Acuity Right Eye Distance:   Left Eye Distance:   Bilateral Distance:    Right Eye Near:   Left Eye Near:    Bilateral Near:     Physical Exam GEN:     alert, chronically ill-appearing female in no distress    HENT:  mucus membranes moist, no nasal discharge EYES:   no scleral injection or discharge NECK:  normal ROM  RESP:  no increased work of breathing, diffuse expiratory wheezing without rales or rhonchi, satting adequately on room air CVS:   regular rate and rhythm Skin:   warm and dry, no rash on visible skin    UC Treatments / Results  Labs (all labs ordered are listed, but only abnormal results are displayed) Labs Reviewed - No data to display  EKG   Radiology No results found.  Procedures Procedures (including critical care time)  Medications Ordered in UC Medications  dexamethasone (DECADRON) injection 10 mg (10 mg Intramuscular Given 07/14/22 1216)  ipratropium-albuterol (DUONEB) 0.5-2.5 (3) MG/3ML nebulizer solution 3 mL (3 mLs Nebulization Given 07/14/22 1221)    Initial Impression / Assessment and Plan / UC Course  I have reviewed the triage  vital signs and the nursing notes.  Pertinent labs & imaging results that were available during my care of the patient were reviewed by me and considered in my medical decision making (see chart for details).       Pt is a 67 y.o. female with history of COPD and supplemental oxygen use at bedtime who presents for ongoing respiratory symptoms. Zakhia is afebrile here without recent antipyretics. Satting 94% on room air. Overall pt is non-toxic appearing, well hydrated, without respiratory distress. Pulmonary exam is remarkable for diffuse expiratory wheezing.  COVID and influenza testing deferred due to duration of symptoms.  Chest x-ray on 07/09/2022 without acute pneumonia.  Her lung sounds are improving.  Given 10 mg IM Decadron and DuoNeb prior to discharge.  Recommended patient be seen in the emergency department if her flareup does not into the next 3 days.  Continue doxycycline as previously prescribed.  Discussed symptomatic treatment.  Typical duration of symptoms discussed.   Return and ED precautions given and voiced understanding. Discussed MDM, treatment plan and plan for follow-up with patient  who agrees with plan.     Final Clinical Impressions(s) / UC Diagnoses   Final  diagnoses:  COPD exacerbation     Discharge Instructions      You were given a breathing treatment and a steroid injection here.  Continue taking your antibiotics as previously prescribed.  I suspect your COPD flare will end soon.  If it does not you may need to be evaluated in the emergency department.      ED Prescriptions   None    PDMP not reviewed this encounter.   Katha Cabal, DO 07/14/22 1232

## 2022-07-14 NOTE — ED Triage Notes (Signed)
Pt c/o sob & hoarseness x2 days. Was seen on 4/10 for the same sx's, states she is feeling slightly better & is wanting another steroid shot.

## 2022-07-14 NOTE — Discharge Instructions (Signed)
You were given a breathing treatment and a steroid injection here.  Continue taking your antibiotics as previously prescribed.  I suspect your COPD flare will end soon.  If it does not you may need to be evaluated in the emergency department.

## 2022-07-23 ENCOUNTER — Ambulatory Visit: Payer: Medicare Other | Admitting: Internal Medicine

## 2022-08-05 ENCOUNTER — Encounter: Payer: Self-pay | Admitting: Pulmonary Disease

## 2022-08-05 ENCOUNTER — Ambulatory Visit (INDEPENDENT_AMBULATORY_CARE_PROVIDER_SITE_OTHER): Payer: 59 | Admitting: Pulmonary Disease

## 2022-08-05 VITALS — BP 122/80 | HR 86 | Temp 97.5°F | Ht 62.0 in | Wt 250.0 lb

## 2022-08-05 DIAGNOSIS — I5032 Chronic diastolic (congestive) heart failure: Secondary | ICD-10-CM | POA: Diagnosis not present

## 2022-08-05 DIAGNOSIS — I2729 Other secondary pulmonary hypertension: Secondary | ICD-10-CM | POA: Diagnosis not present

## 2022-08-05 DIAGNOSIS — I2721 Secondary pulmonary arterial hypertension: Secondary | ICD-10-CM

## 2022-08-05 DIAGNOSIS — J4489 Other specified chronic obstructive pulmonary disease: Secondary | ICD-10-CM

## 2022-08-05 DIAGNOSIS — G4736 Sleep related hypoventilation in conditions classified elsewhere: Secondary | ICD-10-CM | POA: Diagnosis not present

## 2022-08-05 DIAGNOSIS — J986 Disorders of diaphragm: Secondary | ICD-10-CM | POA: Diagnosis not present

## 2022-08-05 DIAGNOSIS — J301 Allergic rhinitis due to pollen: Secondary | ICD-10-CM | POA: Diagnosis not present

## 2022-08-05 DIAGNOSIS — J398 Other specified diseases of upper respiratory tract: Secondary | ICD-10-CM

## 2022-08-05 DIAGNOSIS — Z6841 Body Mass Index (BMI) 40.0 and over, adult: Secondary | ICD-10-CM

## 2022-08-05 DIAGNOSIS — K766 Portal hypertension: Secondary | ICD-10-CM | POA: Diagnosis not present

## 2022-08-05 MED ORDER — ALBUTEROL SULFATE (2.5 MG/3ML) 0.083% IN NEBU: INHALATION_SOLUTION | RESPIRATORY_TRACT | 6 refills | Status: DC

## 2022-08-05 NOTE — Progress Notes (Signed)
Subjective:    Patient ID: Lauren Bartlett, female    DOB: [birth year], 67 y.o.   MRN: [id number] Patient Care Team: Sherlene Shams, MD as PCP - General (Internal Medicine) Iran Ouch, MD as PCP - Cardiology (Cardiology) Salena Saner, MD as Consulting Physician (Pulmonary Disease)  Requesting MD/Service: Dr. Duncan Dull        Date of initial consultation: 03/07/2016 by Dr. Billy Fischer         Reason for consultation: tracheobronchomalacia, DOE   PT PROFILE: 67 y.o. F never smoker, with significant lifetime exposure to second hand smoke, with multiple medical problems including chronic hypoxic respiratory failure, moderate COPD, mitral valve prolapse and regurgitation, pulm htn by echocardiogram, appearance of tracheomalacia on CT chest previously followed by Dr Meredeth Ide and referred for further evaluation of severe exertional limitation. Initial eval suggested severe mitral regurgitation and impression was that this was a major contributor to her severe DOE   DATA: Echocardiogram 02/01/13: Normal LVEF. MVP with moderate MR, Moderate AI, severe TI. RVSP est 91 mmHg CT chest 07/29/15: Cardiomegaly with left atrial dilatation. Tracheobronchomalacia. Air trapping in the lungs bilaterally, indicative of small airways disease Echocardiogram 06/13/14: Normal LVEF, Severe MR, RVSP est 62 mmHg Spirometry 03/12/15: FVC 1.94 L (73% pred). FEV1 1.19 L (55% pred), FEV1/FVC 61% 03/12/15: 225 meters, no desaturation LE venous US 07/29/15: No DVT Echocardiogram 07/31/15: LVEF 60-65%. MV poorly visualized, trivial MR, LA size normal, TV poorly visualized, RVSP not estimated CT chest 03/11/16: Stable changes of tracheobronchomalacia Echocardiogram 04/15/16: Normal LVEF, Severe MR, moderate PAH Mitral valvuloplasty 07/17/16: @ [facility] by Dr Cornelius Moras Echocardiogram 09/15/16: LVEF 60-65%. RVSP estimate 54 mmHg ONO 10/21/16: on RA. 45 mins < 89%, lowest SpO2 84% PFTs 03/26/17: FVC: 1.73 > 1.74 L  (63 %pred), FEV1: 1.01 > 1.16 L (45> 52%pred, 15% improvement after bronchodilator), FEV1/FVC: 59 >67%, TLC: 3.27 L (73 %pred), DLCO 46 %pred, DLCO/VA 103% 07/24/2020 echocardiogram: LVEF 55 to 60%, mildly elevated pulmonary artery systolic pressure mildly dilated left atrium, prosthetic annuloplasty ring in the mitral position.   INTERVAL:  Last seen by me on 30 June 2022 .  At that time she was doing well with the increased dose of Trelegy to 200.  2 visits to the ED since her last visit visits for evaluation of shortness of breath.  Chief Complaint  Patient presents with   Follow-up    No SOB. Some wheezing in the mornings. Cough with green sputum in the morning and clear in the afternoon.   HPI Lauren Bartlett is a 67 year old very complex lifelong never smoker with chronic asthmatic bronchitis and tracheobronchomalacia who follows for the issue of dyspnea.  This is a scheduled visit.  She was last seen on 30 June 2022 and at that time she had noticed improved on her asthmatic bronchitis in terms with increased dose of Trelegy to 200.  The patient states that since she had those changes instituted, she has done very well.  She notes that she feels the Trelegy is doing better for her.  Despite feeling that she is doing better she actually had 2 visits to the emergency room for shortness of breath.  She has multiple reasons for her shortness of breath that are not related to her asthmatic bronchitis.  She does not describe any fevers, chills or sweats.  No change in cough or sputum production.  No other symptomatology.  She does note increased postnasal drip and nasal congestion.  She believes due to  season change.  This responds well to Zyrtec however, Zyrtec does make her sleepy even if she takes it at nighttime and she tries to avoid it.  Overall, she does not feel any shortness of breath over her baseline.  She has not had to use her albuterol rescue of late.  She is compliant with nocturnal oxygen.  She has  not had any orthopnea or paroxysmal nocturnal dyspnea.  She does not endorse any other symptomatology.  Overall she feels that she is doing "better".  She does request a refill of her Proventil solution to be sent to her mail order pharmacy.   Review of Systems A 10 point review of systems was performed and it is as noted above otherwise negative.  Patient Active Problem List   Diagnosis Date Noted   Osteopenia determined by x-ray 08/28/2021   Acute bronchitis with COPD (HCC) 07/11/2021   Unsteady gait when walking 04/29/2021   Aortic atherosclerosis (HCC) 09/16/2020   Recurrent epistaxis 06/16/2020   PAF (paroxysmal atrial fibrillation) (HCC) 12/18/2018   Nocturnal hypoxemia due to obstructive chronic bronchitis 02/06/2018   Encounter for therapeutic drug monitoring 08/13/2016   S/P minimally invasive mitral valve repair 07/17/2016   S/P patent foramen ovale closure 07/17/2016   Chronic diastolic congestive heart failure (HCC)    COPD (chronic obstructive pulmonary disease) (HCC) 05/19/2016   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   Vitamin D deficiency 01/11/2015   Allergic rhinitis 07/09/2014   S/P hysterectomy with oophorectomy 09/22/2013   Pulmonary hypertension (HCC)    Hypocalcemia 09/10/2012   Hypothyroidism 05/04/2012   Morbid obesity (HCC) 08/04/2011   Hypertension    Congenital absence of one kidney    History of sciatica    Social History   Tobacco Use   Smoking status: Never   Smokeless tobacco: Never  Substance Use Topics   Alcohol use: No   Allergies  Allergen Reactions   Codeine Hives and Nausea And Vomiting   Levaquin [Levofloxacin] Nausea And Vomiting   Penicillins Hives, Itching and Other (See Comments)    Has patient had a PCN reaction causing immediate rash, facial/tongue/throat swelling, SOB or lightheadedness with hypotension: No Has patient had a PCN reaction causing severe rash involving mucus membranes or skin necrosis: No Has patient had a PCN reaction  that required hospitalization No Has patient had a PCN reaction occurring within the last 10 years: No If all of the above answers are "NO", then may proceed with Cephalosporin use.   Zithromax [Azithromycin] Other (See Comments)    Reaction:  Hallucinations    Prednisone Palpitations and Other (See Comments)    Reaction:  Hallucinations  Can take injections but not oral meds   Current Meds  Medication Sig   acetaminophen (TYLENOL) 500 MG tablet Take 500 mg by mouth 2 (two) times daily as needed for moderate pain or headache.   albuterol (VENTOLIN HFA) 108 (90 Base) MCG/ACT inhaler INHALE 2 INHALATIONS BY  MOUTH INTO THE LUNGS EVERY  6 HOURS AS NEEDED FOR  WHEEZING OR SHORTNESS OF  BREATH (USE WITH SPACER)   aspirin EC 81 MG EC tablet Take 1 tablet (81 mg total) by mouth daily.   benzonatate (TESSALON) 200 MG capsule Take 1 capsule (200 mg total) by mouth 3 (three) times daily as needed for cough.   calcitRIOL (ROCALTROL) 0.25 MCG capsule TAKE 1 CAPSULE BY MOUTH DAILY   Calcium Carb-Cholecalciferol 600-800 MG-UNIT TABS Take 1 tablet by mouth in the morning, at noon, and at bedtime.  docusate sodium (COLACE) 100 MG capsule Take 100 mg by mouth daily.   famotidine (PEPCID AC) 10 MG chewable tablet Chew 20 mg by mouth daily as needed for heartburn.    ferrous sulfate 325 (65 FE) MG tablet Take 325 mg by mouth daily with breakfast.   Fluticasone-Umeclidin-Vilant (TRELEGY ELLIPTA) 200-62.5-25 MCG/ACT AEPB Inhale 1 puff into the lungs daily.   furosemide (LASIX) 20 MG tablet TAKE 1 TABLET BY MOUTH DAILY .  MAY TAKE AN ADDITIONAL TABLET AS NEEDED FOR WEIGHT GAIN OF 2 LBS  OVERNIGHT OR 5 LBS IN 1 WEEK   levothyroxine (SYNTHROID) 150 MCG tablet Take 1 tablet (150 mcg total) by mouth daily before breakfast.   lidocaine (LIDODERM) 5 % Place 1 patch onto the skin every 12 (twelve) hours. Remove & Discard patch within 12 hours or as directed by MD   loratadine (CLARITIN) 10 MG tablet Take 10 mg by mouth  daily.   losartan (COZAAR) 50 MG tablet Take 1 tablet (50 mg total) by mouth daily.   OXYGEN Inhale 2 L into the lungs at bedtime as needed (at bedtime and as needed).   oxymetazoline (AFRIN) 0.05 % nasal spray Place 1 spray into both nostrils 2 (two) times daily. As needed for nose bleed   polyethylene glycol powder (GLYCOLAX/MIRALAX) powder Take 17 g by mouth 2 (two) times daily as needed.   traMADol (ULTRAM) 50 MG tablet Take 1-2 tablets (50-100 mg total) by mouth every 4 (four) hours as needed for moderate pain.   vitamin B-12 (CYANOCOBALAMIN) 1000 MCG tablet Take 1 tablet (1,000 mcg total) by mouth daily.   Vitamin D, Ergocalciferol, (DRISDOL) 1.25 MG (50000 UNIT) CAPS capsule TAKE 1 CAPSULE BY MOUTH 1 TIME A WEEK   [DISCONTINUED] albuterol (PROVENTIL) (2.5 MG/3ML) 0.083% nebulizer solution INHALE THE CONTENTS OF 1 VIAL VIA NEBULIZER EVERY 6 HOURS AS NEEDED FOR WHEEZING  OR FOR SHORTNESS OF BREATH   Immunization History  Administered Date(s) Administered   Fluad Quad(high Dose 65+) 12/20/2020   Influenza Split 01/02/2012   Influenza,inj,Quad PF,6+ Mos 03/13/2014, 01/08/2015, 02/03/2018, 01/26/2019, 02/02/2020   Influenza-Unspecified 01/11/2013, 12/30/2014   PFIZER(Purple Top)SARS-COV-2 Vaccination 08/12/2019, 09/07/2019   Pneumococcal Conjugate-13 02/27/2015   Pneumococcal Polysaccharide-23 03/07/2013, 04/13/2019   Tdap 11/12/2009       Objective:   Physical Exam BP 122/80 (BP Location: Left Arm, Cuff Size: Large)   Pulse 86   Temp (!) 97.5 F (36.4 C)   Ht 5\' 2"  (1.575 m)   Wt 250 lb (113.4 kg) Comment: per patient. in a wheelchair  SpO2 97%   BMI 45.73 kg/m   SpO2: 97 % O2 Device: None (Room air)  GENERAL: Obese woman, no acute distress, presents in transport chair.  No conversational dyspnea.  Looks older than stated age. HEAD: Normocephalic, atraumatic.  EYES: Pupils equal, round, reactive to light.  No scleral icterus.  MOUTH: Oral mucosa moist.  No thrush. NECK:  Supple. No thyromegaly. Trachea midline. No JVD.  No adenopathy. PULMONARY: Good air entry bilaterally.  Slightly diminished breath sounds on the right base (elevated right hemidiaphragm).  No adventitious sounds noted today. CARDIOVASCULAR: S1 and S2. Regular rate and rhythm.  Accentuated P2. ABDOMEN: Obese otherwise benign. MUSCULOSKELETAL: No joint deformity, no clubbing, no edema.  NEUROLOGIC: No overt focal deficit, gait not tested patient presents in transport chair. SKIN: Intact,warm,dry. PSYCH: Flat affect.     Assessment & Plan:     ICD-10-CM   1. Chronic asthmatic bronchitis  J44.89    Continue  Trelegy 200 Continue as needed albuterol Appears compensated today    2. Tracheomalacia  J39.8    This issue adds to her dyspnea Dynamic collapse of major airways    3. Nocturnal hypoxemia due to pulmonary hypertension (HCC)  I27.29    G47.36    Multifactorial PAH/asthmatic bronchitis Patient compliant with oxygen therapy Continue oxygen at 2 L/min nocturnally    4. PAH (pulmonary arterial hypertension) with portal hypertension (HCC)  I27.21    K76.6    Continue supplemental oxygen Continue follow-up with cardiology    5. Chronic diastolic congestive heart failure (HCC)  I50.32    This issue adds complexity to her management Patient follows with cardiology    6. Morbid obesity with BMI of 45.0-49.9, adult (HCC)  E66.01    Z68.42    This issue adds complexity to her management She would benefit from weight loss Likely has issues with alveolar hypoventilation    7. Elevated hemidiaphragm - RIGHT  J98.6    Worse after mitral annuloplasty 2018 This issue adds to her symptom of dyspnea    8. Seasonal allergic rhinitis due to pollen  J30.1    May use either Claritin or Allegra Patient states Zyrtec makes her sleepy Nasal hygiene     Meds ordered this encounter  Medications   albuterol (PROVENTIL) (2.5 MG/3ML) 0.083% nebulizer solution    Sig: INHALE THE CONTENTS OF  1 VIAL VIA NEBULIZER EVERY 6 HOURS AS NEEDED FOR WHEEZING  OR FOR SHORTNESS OF BREATH    Dispense:  180 mL    Refill:  6   Will see the patient in follow-up in 3 to 4 months time she is to call sooner should any new problems arise.  Gailen Shelter, MD Advanced Bronchoscopy PCCM [facility]    *This note was dictated using voice recognition software/Dragon.  Despite best efforts to proofread, errors can occur which can change the meaning. Any transcriptional errors that result from this process are unintentional and may not be fully corrected at the time of dictation.

## 2022-08-05 NOTE — Patient Instructions (Signed)
Continue Trelegy as you are doing.  Continue your other medications as you are doing.  Continue oxygen at nighttime.  We have sent the prescription to the mail order pharmacy for you.  If the Zyrtec makes you sleepy you can switch to using either Claritin or Allegra for your allergies.  We will see you in follow-up in 3 to 4 months time call sooner should any new problems arise.

## 2022-08-06 ENCOUNTER — Encounter: Payer: Self-pay | Admitting: Pulmonary Disease

## 2022-08-06 DIAGNOSIS — J449 Chronic obstructive pulmonary disease, unspecified: Secondary | ICD-10-CM | POA: Diagnosis not present

## 2022-08-07 ENCOUNTER — Ambulatory Visit: Payer: 59 | Attending: Medical

## 2022-08-07 DIAGNOSIS — I050 Rheumatic mitral stenosis: Secondary | ICD-10-CM

## 2022-08-07 DIAGNOSIS — Z9889 Other specified postprocedural states: Secondary | ICD-10-CM | POA: Diagnosis not present

## 2022-08-07 DIAGNOSIS — Z954 Presence of other heart-valve replacement: Secondary | ICD-10-CM | POA: Diagnosis not present

## 2022-08-07 LAB — ECHOCARDIOGRAM COMPLETE
AR max vel: 3.01 cm2
AV Area VTI: 2.74 cm2
AV Area mean vel: 2.82 cm2
AV Mean grad: 3 mmHg
AV Peak grad: 5.6 mmHg
Ao pk vel: 1.18 m/s
Area-P 1/2: 4.06 cm2
Calc EF: 55.1 %
MV VTI: 1.34 cm2
S' Lateral: 2.6 cm
Single Plane A2C EF: 54.9 %
Single Plane A4C EF: 57.1 %

## 2022-08-14 ENCOUNTER — Encounter: Payer: Self-pay | Admitting: Cardiovascular Disease

## 2022-08-14 ENCOUNTER — Ambulatory Visit: Payer: 59 | Attending: Cardiovascular Disease | Admitting: Cardiovascular Disease

## 2022-08-14 VITALS — BP 110/70 | HR 91 | Ht 62.0 in | Wt 249.5 lb

## 2022-08-14 DIAGNOSIS — I5032 Chronic diastolic (congestive) heart failure: Secondary | ICD-10-CM

## 2022-08-14 DIAGNOSIS — J449 Chronic obstructive pulmonary disease, unspecified: Secondary | ICD-10-CM

## 2022-08-14 DIAGNOSIS — Z9889 Other specified postprocedural states: Secondary | ICD-10-CM | POA: Diagnosis not present

## 2022-08-14 DIAGNOSIS — I10 Essential (primary) hypertension: Secondary | ICD-10-CM

## 2022-08-14 DIAGNOSIS — Z954 Presence of other heart-valve replacement: Secondary | ICD-10-CM | POA: Diagnosis not present

## 2022-08-14 NOTE — Progress Notes (Signed)
Cardiology Office Note   Date:  08/14/2022   ID:  Lauren Bartlett, DOB May 30, 1955, MRN [id number]  PCP:  Sherlene Shams, MD  Cardiologist: Kirke Corin  Chief Complaint  Patient presents with   Follow-up    6 month f/u no complaints today. Meds reviewed verbally with pt.      History of Present Illness: Lauren Bartlett is a 67 y.o. female who is here today for a follow-up visit regarding mitral regurgitation status post mitral valve repair in April 2018. The patient has chronic medical conditions that include COPD, mitral valve prolapse, essential hypertension, anxiety and depression. She is a lifelong nonsmoker but reports being exposed to smoking. She underwent successful mitral valve repair and PFO closure in April of 2018. She had postoperative atrial fibrillation that was treated with amiodarone.  Cardiac catheterization before surgery showed normal coronary arteries.  Echocardiogram in April 2022 showed normal LV systolic function with mild pulmonary hypertension, moderate mitral stenosis with a mean gradient of 8 mmHg and no significant regurgitation. She had multiple urgent care visits over the last 6 months for COPD exasperation.  She had an echocardiogram done last week which showed normal LV systolic function, mild pulmonary hypertension, intact mitral valve repair with stable gradient of 7 mmHg and moderate tricuspid regurgitation.  She has been doing reasonably well overall with no chest pain.  No significant dyspnea today.  She lives by herself and continues to drive.  She gets Meals on Wheels.  She reports improvement in her depression.  Past Medical History:  Diagnosis Date   Anxiety    Asthmatic bronchitis , chronic    Bleeding from the nose    Chronic diastolic congestive heart failure (HCC)    a. 03/2016 Echo: EF 55-60%, Gr2 DD; b. 08/2016 Echo: EF 60-65%, nl diast fxn; c. 01/2019 Echo: EF 55-60%. Nl RV fxn.   Congenital absence of one kidney    COPD (chronic  obstructive pulmonary disease) (HCC)    a. 02/2017 PFT: mod/sev obstructive airway dzs w/ significant bronchodilator response.   Depression    [facility]   Dyspnea    Emphysema/COPD [facility])    GERD (gastroesophageal reflux disease)    History of cardiac cath    a. 05/2016 Cath: nl cors.   History of sciatica    Hypertension    Hypothyroidism    Mitral Valve Prolapse & Severe mitral regurgitation s/p MVR    a. 03/2016 Echo: severe MVP involving the posterior leaflet, sev MR;  b. 06/2016 min invasive MV repair w/ triangular resection of flail segment (P2) of posterior leaflet, gore-tex neochord placement x 4, Sorin Memo 3D rin Annulosplasty (26mm, catalog # [id number], ser # [id number]); c. 08/2016 Echo: MV area 2.06 cm^2 (pressure 1/2 time); d. 01/2019 Echo: Mild MS w/ mean grad .   Motion sickness    cars   Pneumonia    PONV (postoperative nausea and vomiting)    Post-op Afib    a. 06/2016 following MV repair-->short-course amio. Coumadin d/c'd 2/2 epistaxis.   Pulmonary hypertension (HCC)    a. 08/2016 Echo: PASP ; b. 01/2019 Echo: RVSP 45.26mmHg.   S/P patent foramen ovale closure    a. 06/2016 @ time of MV Repair.   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    Past Surgical History:  Procedure Laterality Date   ABDOMINAL HYSTERECTOMY     BREAST BIOPSY Right 2011   [facility]< benign  CATARACT EXTRACTION W/PHACO Left 11/14/2015   Procedure: CATARACT EXTRACTION PHACO AND INTRAOCULAR LENS PLACEMENT (IOC);  Surgeon: Lockie Mola, MD;  Location: [facility];  Service: Ophthalmology;  Laterality: Left;  sleep apnea Toric   CATARACT EXTRACTION W/PHACO Right 12/19/2015   Procedure: CATARACT EXTRACTION PHACO AND INTRAOCULAR LENS PLACEMENT (IOC);  Surgeon: Lockie Mola, MD;  Location: [facility];  Service: Ophthalmology;  Laterality: Right;  ANXIETY GENEROUS IV SEDATION TORIC LEN   COMBINED HYSTERECTOMY ABDOMINAL W/ A&P REPAIR /  OOPHORECTOMY  1996   benign tumor   INNER EAR SURGERY     bilateral   MITRAL VALVE REPAIR Right 07/17/2016   Procedure: MINIMALLY INVASIVE MITRAL VALVE REPAIR (MVR) USING 26 SORIN MEMO 3D ANNULOPLASTY RING;  Surgeon: Purcell Nails, MD;  Location: [facility] OR;  Service: Open Heart Surgery;  Laterality: Right;   PATENT FORAMEN OVALE(PFO) CLOSURE N/A 07/17/2016   Procedure: PATENT FORAMEN OVALE (PFO) CLOSURE;  Surgeon: Purcell Nails, MD;  Location: [facility] OR;  Service: Open Heart Surgery;  Laterality: N/A;   RIGHT/LEFT HEART CATH AND CORONARY ANGIOGRAPHY Bilateral 06/16/2016   Procedure: Right/Left Heart Cath and Coronary Angiography;  Surgeon: Iran Ouch, MD;  Location: [facility];  Service: Cardiovascular;  Laterality: Bilateral;   TEE WITHOUT CARDIOVERSION N/A 05/19/2016   Procedure: TRANSESOPHAGEAL ECHOCARDIOGRAM (TEE);  Surgeon: Iran Ouch, MD;  Location: [facility] ORS;  Service: Cardiovascular;  Laterality: N/A;   TEE WITHOUT CARDIOVERSION N/A 06/09/2016   Procedure: TRANSESOPHAGEAL ECHOCARDIOGRAM (TEE);  Surgeon: Iran Ouch, MD;  Location: [facility] ORS;  Service: Cardiovascular;  Laterality: N/A;   TEE WITHOUT CARDIOVERSION N/A 06/16/2016   Procedure: TRANSESOPHAGEAL ECHOCARDIOGRAM (TEE);  Surgeon: Iran Ouch, MD;  Location: [facility] OR[facility];  Service: Cardiovascular;  Laterality: N/A;   TEE WITHOUT CARDIOVERSION N/A 07/17/2016   Procedure: TRANSESOPHAGEAL ECHOCARDIOGRAM (TEE);  Surgeon: Purcell Nails, MD;  Location: [facility] OR;  Service: Open Heart Surgery;  Laterality: N/A;   TONSILLECTOMY       Current Outpatient Medications  Medication Sig Dispense Refill   acetaminophen (TYLENOL) 500 MG tablet Take 500 mg by mouth 2 (two) times daily as needed for moderate pain or headache.     albuterol (PROVENTIL) (2.5 MG/3ML) 0.083% nebulizer solution INHALE THE CONTENTS OF 1 VIAL VIA NEBULIZER EVERY 6 HOURS AS NEEDED FOR WHEEZING  OR FOR SHORTNESS OF BREATH 180 mL 6   albuterol  (VENTOLIN HFA) 108 (90 Base) MCG/ACT inhaler INHALE 2 INHALATIONS BY  MOUTH INTO THE LUNGS EVERY  6 HOURS AS NEEDED FOR  WHEEZING OR SHORTNESS OF  BREATH (USE WITH SPACER) 36 g 6   aspirin EC 81 MG EC tablet Take 1 tablet (81 mg total) by mouth daily.     benzonatate (TESSALON) 200 MG capsule Take 1 capsule (200 mg total) by mouth 3 (three) times daily as needed for cough. 20 capsule 0   calcitRIOL (ROCALTROL) 0.25 MCG capsule TAKE 1 CAPSULE BY MOUTH DAILY 100 capsule 2   Calcium Carb-Cholecalciferol 600-800 MG-UNIT TABS Take 1 tablet by mouth in the morning, at noon, and at bedtime.      docusate sodium (COLACE) 100 MG capsule Take 100 mg by mouth daily.     doxycycline (VIBRA-TABS) 100 MG tablet Take 1 tablet (100 mg total) by mouth 2 (two) times daily. 14 tablet 0   famotidine (PEPCID AC) 10 MG chewable tablet Chew 20 mg by mouth daily as needed for heartburn.      ferrous sulfate 325 (65 FE)  MG tablet Take 325 mg by mouth daily with breakfast.     Fluticasone-Umeclidin-Vilant (TRELEGY ELLIPTA) 200-62.5-25 MCG/ACT AEPB Inhale 1 puff into the lungs daily. 90 each 4   furosemide (LASIX) 20 MG tablet TAKE 1 TABLET BY MOUTH DAILY .  MAY TAKE AN ADDITIONAL TABLET AS NEEDED FOR WEIGHT GAIN OF 2 LBS  OVERNIGHT OR 5 LBS IN 1 WEEK 200 tablet 2   levothyroxine (SYNTHROID) 150 MCG tablet Take 1 tablet (150 mcg total) by mouth daily before breakfast. 90 tablet 3   lidocaine (LIDODERM) 5 % Place 1 patch onto the skin every 12 (twelve) hours. Remove & Discard patch within 12 hours or as directed by MD 15 patch 0   loratadine (CLARITIN) 10 MG tablet Take 10 mg by mouth daily.     losartan (COZAAR) 50 MG tablet Take 1 tablet (50 mg total) by mouth daily. 90 tablet 3   OXYGEN Inhale 2 L into the lungs at bedtime as needed (at bedtime and as needed).     oxymetazoline (AFRIN) 0.05 % nasal spray Place 1 spray into both nostrils 2 (two) times daily. As needed for nose bleed     polyethylene glycol powder  (GLYCOLAX/MIRALAX) powder Take 17 g by mouth 2 (two) times daily as needed. 3350 g 1   traMADol (ULTRAM) 50 MG tablet Take 1-2 tablets (50-100 mg total) by mouth every 4 (four) hours as needed for moderate pain. 30 tablet 0   vitamin B-12 (CYANOCOBALAMIN) 1000 MCG tablet Take 1 tablet (1,000 mcg total) by mouth daily. 90 tablet 3   Vitamin D, Ergocalciferol, (DRISDOL) 1.25 MG (50000 UNIT) CAPS capsule TAKE 1 CAPSULE BY MOUTH 1 TIME A WEEK 12 capsule 3   No current facility-administered medications for this visit.    Allergies:   Codeine, Levaquin [levofloxacin], Penicillins, Zithromax [azithromycin], and Prednisone    Social History:  The patient  reports that she has never smoked. She has never used smokeless tobacco. She reports that she does not drink alcohol and does not use drugs.   Family History:  The patient's family history includes Breast cancer in her paternal aunt; Cancer in her brother; Coronary artery disease in her father; Heart disease in her brother and father; Hypertension in her father and mother; Multiple sclerosis in her mother.    ROS:  Please see the history of present illness.   Otherwise, review of systems are positive for none.   All other systems are reviewed and negative.    PHYSICAL EXAM: VS:  BP 110/70 (BP Location: Left Arm, Patient Position: Sitting, Cuff Size: Large)   Pulse 91   Ht 5\' 2"  (1.575 m)   Wt 249 lb 8 oz (113.2 kg)   SpO2 96%   BMI 45.63 kg/m  , BMI Body mass index is 45.63 kg/m. GEN: Well nourished, well developed, in no acute distress  HEENT: normal  Neck: no JVD, carotid bruits, or masses Cardiac: RRR; no rubs, or gallops,no edema . No heart murmurs.  Respiratory: Clear to auscultation bilaterally. GI: soft, nontender, nondistended, + BS MS: no deformity or atrophy  Skin: warm and dry, no rash Neuro:  Strength and sensation are intact Psych: euthymic mood, full affect    EKG:  EKG is ordered today. EKG showed normal sinus rhythm  with no significant ST or T wave changes.  Recent Labs: 03/04/2022: ALT 16; BUN 15; Creatinine, Ser 0.83; Hemoglobin 14.4; Platelets 174.0; Potassium 4.0; Sodium 141; TSH 0.36    Lipid Panel  Component Value Date/Time   CHOL 204 (H) 03/04/2022 1223   TRIG 94.0 03/04/2022 1223   HDL 77.40 03/04/2022 1223   CHOLHDL 3 03/04/2022 1223   VLDL 18.8 03/04/2022 1223   LDLCALC 108 (H) 03/04/2022 1223   LDLDIRECT 101.0 03/04/2022 1223      Wt Readings from Last 3 Encounters:  08/14/22 249 lb 8 oz (113.2 kg)  08/05/22 250 lb (113.4 kg)  07/14/22 250 lb (113.4 kg)         05/13/2016    1:49 PM  PAD Screen  Previous PAD dx? No  Previous surgical procedure? No  Pain with walking? No  Feet/toe relief with dangling? No  Painful, non-healing ulcers? No  Extremities discolored? No      ASSESSMENT AND PLAN:  1.  Status post successful mitral valve repair and PFO closure for severe mitral regurgitation due to mitral valve prolapse:   She is doing extremely well from a cardiac standpoint.  I reviewed the results of recent echocardiogram with her.  Mitral valve repair was intact.  The gradient was moderately elevated at 7 mmHg but stable compared to most recent echocardiogram.  2. Essential hypertension: Blood pressure is controlled.   3.  COPD: Seems to be stable overall.  She is followed by Dr. Jayme Cloud.  4.  Chronic diastolic heart failure with mild pulmonary hypertension.  She appears to be euvolemic on small dose furosemide 20 mg daily.    Disposition:   FU with me in 12 months  Signed,  Lorine Bears, MD  08/14/2022 10:54 AM    [facility]

## 2022-08-14 NOTE — Patient Instructions (Signed)
Medication Instructions:  No changes *If you need a refill on your cardiac medications before your next appointment, please call your pharmacy*   Lab Work: None ordered If you have labs (blood work) drawn today and your tests are completely normal, you will receive your results only by: MyChart Message (if you have MyChart) OR A paper copy in the mail If you have any lab test that is abnormal or we need to change your treatment, we will call you to review the results.   Testing/Procedures: None ordered   Follow-Up: At [facility], you and your health needs are our priority.  As part of our continuing mission to provide you with exceptional heart care, we have created designated Provider Care Teams.  These Care Teams include your primary Cardiologist (physician) and Advanced Practice Providers (APPs -  Physician Assistants and Nurse Practitioners) who all work together to provide you with the care you need, when you need it.  We recommend signing up for the patient portal called "MyChart".  Sign up information is provided on this After Visit Summary.  MyChart is used to connect with patients for Virtual Visits (Telemedicine).  Patients are able to view lab/test results, encounter notes, upcoming appointments, etc.  Non-urgent messages can be sent to your provider as well.   To learn more about what you can do with MyChart, go to [email or web address].    Your next appointment:   12 month(s)  Provider:   You may see Muhammad Arida, MD or one of the following Advanced Practice Providers on your designated Care Team:   Christopher Berge, NP Ryan Dunn, PA-C Cadence Furth, PA-C Sheri Hammock, NP    

## 2022-08-18 DIAGNOSIS — H903 Sensorineural hearing loss, bilateral: Secondary | ICD-10-CM | POA: Diagnosis not present

## 2022-08-18 DIAGNOSIS — H90A31 Mixed conductive and sensorineural hearing loss, unilateral, right ear with restricted hearing on the contralateral side: Secondary | ICD-10-CM | POA: Diagnosis not present

## 2022-08-19 ENCOUNTER — Encounter: Payer: Self-pay | Admitting: Internal Medicine

## 2022-08-19 ENCOUNTER — Ambulatory Visit (INDEPENDENT_AMBULATORY_CARE_PROVIDER_SITE_OTHER): Payer: 59 | Admitting: Internal Medicine

## 2022-08-19 VITALS — BP 130/74 | HR 89 | Temp 97.8°F | Ht 62.0 in | Wt 250.8 lb

## 2022-08-19 DIAGNOSIS — G4736 Sleep related hypoventilation in conditions classified elsewhere: Secondary | ICD-10-CM | POA: Diagnosis not present

## 2022-08-19 DIAGNOSIS — R2681 Unsteadiness on feet: Secondary | ICD-10-CM | POA: Diagnosis not present

## 2022-08-19 DIAGNOSIS — I2729 Other secondary pulmonary hypertension: Secondary | ICD-10-CM

## 2022-08-19 DIAGNOSIS — T466X5A Adverse effect of antihyperlipidemic and antiarteriosclerotic drugs, initial encounter: Secondary | ICD-10-CM

## 2022-08-19 DIAGNOSIS — I700 Atherosclerosis of aorta: Secondary | ICD-10-CM | POA: Diagnosis not present

## 2022-08-19 DIAGNOSIS — Z6841 Body Mass Index (BMI) 40.0 and over, adult: Secondary | ICD-10-CM

## 2022-08-19 DIAGNOSIS — E559 Vitamin D deficiency, unspecified: Secondary | ICD-10-CM

## 2022-08-19 LAB — COMPREHENSIVE METABOLIC PANEL
ALT: 9 U/L (ref 0–35)
AST: 14 U/L (ref 0–37)
Albumin: 4 g/dL (ref 3.5–5.2)
Alkaline Phosphatase: 61 U/L (ref 39–117)
BUN: 17 mg/dL (ref 6–23)
CO2: 31 mEq/L (ref 19–32)
Calcium: 8.9 mg/dL (ref 8.4–10.5)
Chloride: 100 mEq/L (ref 96–112)
Creatinine, Ser: 0.85 mg/dL (ref 0.40–1.20)
GFR: 71.25 mL/min (ref >60.00)
Glucose, Bld: 84 mg/dL (ref 70–99)
Potassium: 4.2 mEq/L (ref 3.5–5.1)
Sodium: 141 mEq/L (ref 135–145)
Total Bilirubin: 0.6 mg/dL (ref 0.2–1.2)
Total Protein: 7.2 g/dL (ref 6.0–8.3)

## 2022-08-19 LAB — VITAMIN D 25 HYDROXY (VIT D DEFICIENCY, FRACTURES): VITD: 76 ng/mL (ref 30.00–100.00)

## 2022-08-19 MED ORDER — CALCITRIOL 0.25 MCG PO CAPS: 0.2500 ug | ORAL_CAPSULE | Freq: Every day | ORAL | 2 refills | Status: DC

## 2022-08-19 NOTE — Patient Instructions (Addendum)
Fexofenadine  is generic  for allegra and will not make you sleepy .  You can get this at [facility] or at [facility] and take instead of zyrtec.   Do not apply oxygen  during the day unless you are lying dow,  or if your oxygen level is  90 % or below and use your albuterol    Days of High humidity and poor air quality are days when you should stay inside   Try to do your walks in the early morning before it gets hot and use your albuterol inhaler first .  I want you to get down to 230 lbs (your personal best in July 2019)  Your mammogram was normal

## 2022-08-19 NOTE — Progress Notes (Unsigned)
Subjective:  Patient ID: Lauren Bartlett, female    DOB: 03-Mar-1956  Age: 67 y.o. MRN: [id number]  CC: The primary encounter diagnosis was Aortic atherosclerosis (HCC). Diagnoses of Hypocalcemia, Myalgia due to statin, and Nocturnal hypoxemia due to pulmonary hypertension (HCC) were also pertinent to this visit.   HPI Lauren Bartlett presents for follow up and  Chief Complaint  Patient presents with   Medical Management of Chronic Issues  including aortic atherosclerosis,  obesity,  chronic diastolic heart failure secondary to pulmonary hypertension,  COPD (non tobacco) ,  and LVH,  and hypocalcemia  1) Aortic atherosclerosis :  she continues to defer therapy  as she is statin intolerant  2) COPD/pulmonary hypertension/nocturnal hypoxemia :  multifactorial with pulmonary hypertension,, tracheomalacia, ad MVR .  She is using furosemide and losartan.  Improved symptoms with increased dose of Trelegy prescribed by Dr Jayme Cloud.  Using  Nocturnal oxygen   3) IDA;  taking iron. Energy level has improved  and she is trying to get more exercise  Outpatient Medications Prior to Visit  Medication Sig Dispense Refill   acetaminophen (TYLENOL) 500 MG tablet Take 500 mg by mouth 2 (two) times daily as needed for moderate pain or headache.     albuterol (PROVENTIL) (2.5 MG/3ML) 0.083% nebulizer solution INHALE THE CONTENTS OF 1 VIAL VIA NEBULIZER EVERY 6 HOURS AS NEEDED FOR WHEEZING  OR FOR SHORTNESS OF BREATH 180 mL 6   albuterol (VENTOLIN HFA) 108 (90 Base) MCG/ACT inhaler INHALE 2 INHALATIONS BY  MOUTH INTO THE LUNGS EVERY  6 HOURS AS NEEDED FOR  WHEEZING OR SHORTNESS OF  BREATH (USE WITH SPACER) 36 g 6   aspirin EC 81 MG EC tablet Take 1 tablet (81 mg total) by mouth daily.     Calcium Carb-Cholecalciferol 600-800 MG-UNIT TABS Take 1 tablet by mouth in the morning, at noon, and at bedtime.      docusate sodium (COLACE) 100 MG capsule Take 100 mg by mouth daily.     famotidine (PEPCID AC) 10 MG chewable  tablet Chew 20 mg by mouth daily as needed for heartburn.      ferrous sulfate 325 (65 FE) MG tablet Take 325 mg by mouth daily with breakfast.     Fluticasone-Umeclidin-Vilant (TRELEGY ELLIPTA) 200-62.5-25 MCG/ACT AEPB Inhale 1 puff into the lungs daily. 90 each 4   furosemide (LASIX) 20 MG tablet TAKE 1 TABLET BY MOUTH DAILY .  MAY TAKE AN ADDITIONAL TABLET AS NEEDED FOR WEIGHT GAIN OF 2 LBS  OVERNIGHT OR 5 LBS IN 1 WEEK 200 tablet 2   levothyroxine (SYNTHROID) 150 MCG tablet Take 1 tablet (150 mcg total) by mouth daily before breakfast. 90 tablet 3   lidocaine (LIDODERM) 5 % Place 1 patch onto the skin every 12 (twelve) hours. Remove & Discard patch within 12 hours or as directed by MD 15 patch 0   loratadine (CLARITIN) 10 MG tablet Take 10 mg by mouth daily.     losartan (COZAAR) 50 MG tablet Take 1 tablet (50 mg total) by mouth daily. 90 tablet 3   OXYGEN Inhale 2 L into the lungs at bedtime as needed (at bedtime and as needed).     oxymetazoline (AFRIN) 0.05 % nasal spray Place 1 spray into both nostrils 2 (two) times daily. As needed for nose bleed     polyethylene glycol powder (GLYCOLAX/MIRALAX) powder Take 17 g by mouth 2 (two) times daily as needed. 3350 g 1   traMADol (ULTRAM) 50  MG tablet Take 1-2 tablets (50-100 mg total) by mouth every 4 (four) hours as needed for moderate pain. 30 tablet 0   vitamin B-12 (CYANOCOBALAMIN) 1000 MCG tablet Take 1 tablet (1,000 mcg total) by mouth daily. 90 tablet 3   Vitamin D, Ergocalciferol, (DRISDOL) 1.25 MG (50000 UNIT) CAPS capsule TAKE 1 CAPSULE BY MOUTH 1 TIME A WEEK 12 capsule 3   calcitRIOL (ROCALTROL) 0.25 MCG capsule TAKE 1 CAPSULE BY MOUTH DAILY 100 capsule 2   benzonatate (TESSALON) 200 MG capsule Take 1 capsule (200 mg total) by mouth 3 (three) times daily as needed for cough. (Patient not taking: Reported on 08/19/2022) 20 capsule 0   doxycycline (VIBRA-TABS) 100 MG tablet Take 1 tablet (100 mg total) by mouth 2 (two) times daily. (Patient  not taking: Reported on 08/19/2022) 14 tablet 0   No facility-administered medications prior to visit.    Review of Systems;  Patient denies headache, fevers, malaise, unintentional weight loss, skin rash, eye pain, sinus congestion and sinus pain, sore throat, dysphagia,  hemoptysis , cough, dyspnea, wheezing, chest pain, palpitations, orthopnea, edema, abdominal pain, nausea, melena, diarrhea, constipation, flank pain, dysuria, hematuria, urinary  Frequency, nocturia, numbness, tingling, seizures,  Focal weakness, Loss of consciousness,  Tremor, insomnia, depression, anxiety, and suicidal ideation.      Objective:  BP 130/74   Pulse 89   Temp 97.8 F (36.6 C) (Oral)   Ht 5\' 2"  (1.575 m)   Wt 250 lb 12.8 oz (113.8 kg)   SpO2 93%   BMI 45.87 kg/m   BP Readings from Last 3 Encounters:  08/19/22 130/74  08/14/22 110/70  08/05/22 122/80    Wt Readings from Last 3 Encounters:  08/19/22 250 lb 12.8 oz (113.8 kg)  08/14/22 249 lb 8 oz (113.2 kg)  08/05/22 250 lb (113.4 kg)    Physical Exam Vitals reviewed.  Constitutional:      General: She is not in acute distress.    Appearance: Normal appearance. She is normal weight. She is not ill-appearing, toxic-appearing or diaphoretic.  HENT:     Head: Normocephalic.  Eyes:     General: No scleral icterus.       Right eye: No discharge.        Left eye: No discharge.     Conjunctiva/sclera: Conjunctivae normal.  Cardiovascular:     Rate and Rhythm: Normal rate and regular rhythm.     Heart sounds: Normal heart sounds.  Pulmonary:     Effort: Pulmonary effort is normal. No respiratory distress.     Breath sounds: Normal breath sounds.  Musculoskeletal:        General: Normal range of motion.  Skin:    General: Skin is warm and dry.  Neurological:     General: No focal deficit present.     Mental Status: She is alert and oriented to person, place, and time. Mental status is at baseline.  Psychiatric:        Mood and Affect:  Mood normal.        Behavior: Behavior normal.        Thought Content: Thought content normal.        Judgment: Judgment normal.   Lab Results  Component Value Date   HGBA1C 5.5 03/04/2022   HGBA1C 5.6 12/20/2020   HGBA1C 5.5 01/26/2019    Lab Results  Component Value Date   CREATININE 0.85 08/19/2022   CREATININE 0.83 03/04/2022   CREATININE 0.94 11/28/2021    Lab Results  Component Value Date   WBC 6.0 03/04/2022   HGB 14.4 03/04/2022   HCT 43.7 03/04/2022   PLT 174.0 03/04/2022   GLUCOSE 84 08/19/2022   CHOL 204 (H) 03/04/2022   TRIG 94.0 03/04/2022   HDL 77.40 03/04/2022   LDLDIRECT 101.0 03/04/2022   LDLCALC 108 (H) 03/04/2022   ALT 9 08/19/2022   AST 14 08/19/2022   NA 141 08/19/2022   K 4.2 08/19/2022   CL 100 08/19/2022   CREATININE 0.85 08/19/2022   BUN 17 08/19/2022   CO2 31 08/19/2022   TSH 0.36 03/04/2022   INR 1.1 (H) 06/13/2020   HGBA1C 5.5 03/04/2022   MICROALBUR <0.7 03/04/2022    No results found.  Assessment & Plan:  .Aortic atherosclerosis (HCC) Assessment & Plan: She has deferred statin use repeatedly due to prior trials causing myalgias.    Hypocalcemia -     Calcitriol; Take 1 capsule (0.25 mcg total) by mouth daily.  Dispense: 100 capsule; Refill: 2 -     Comprehensive metabolic panel -     VITAMIN D 25 Hydroxy (Vit-D Deficiency, Fractures) -     PTH, intact and calcium  Myalgia due to statin  Nocturnal hypoxemia due to pulmonary hypertension (HCC) Assessment & Plan: She Continues to require nocturnal use of supplemental oxygen at 2 L/min due to pulmonary hypertension    Follow-up: No follow-ups on file.   Sherlene Shams, MD

## 2022-08-19 NOTE — Assessment & Plan Note (Signed)
She has deferred statin use repeatedly due to prior trials causing myalgias.

## 2022-08-19 NOTE — Assessment & Plan Note (Signed)
She Continues to require nocturnal use of supplemental oxygen at 2 L/min due to pulmonary hypertension

## 2022-08-20 LAB — PTH, INTACT AND CALCIUM
Calcium: 8.9 mg/dL (ref 8.6–10.4)
PTH: 41 pg/mL (ref 16–77)

## 2022-08-20 NOTE — Assessment & Plan Note (Signed)
At goal.  Continue current supplementation

## 2022-08-20 NOTE — Assessment & Plan Note (Signed)
have addressed  BMI and recommended wt loss of 10% of body weight over the next 6 months using a low glycemic index diet and regular participation in aerobic exercise a minimum of 30 minutes, a  minimum of 5 days per week.  

## 2022-08-20 NOTE — Assessment & Plan Note (Addendum)
Leading to fear of falling and reduced physical activity .  She denies dizziness, vertigo, and orthostasis.  Home PT  ws ordered at last visit to strengthen proximal muscles,  Improve balance and assess need for walker

## 2022-09-06 DIAGNOSIS — J449 Chronic obstructive pulmonary disease, unspecified: Secondary | ICD-10-CM | POA: Diagnosis not present

## 2022-09-25 ENCOUNTER — Other Ambulatory Visit: Payer: Self-pay | Admitting: Internal Medicine

## 2022-09-25 DIAGNOSIS — E034 Atrophy of thyroid (acquired): Secondary | ICD-10-CM

## 2022-10-06 DIAGNOSIS — J449 Chronic obstructive pulmonary disease, unspecified: Secondary | ICD-10-CM | POA: Diagnosis not present

## 2022-11-06 DIAGNOSIS — J449 Chronic obstructive pulmonary disease, unspecified: Secondary | ICD-10-CM | POA: Diagnosis not present

## 2022-12-07 DIAGNOSIS — J449 Chronic obstructive pulmonary disease, unspecified: Secondary | ICD-10-CM | POA: Diagnosis not present

## 2022-12-10 ENCOUNTER — Ambulatory Visit: Payer: 59 | Admitting: Internal Medicine

## 2022-12-12 DIAGNOSIS — Z961 Presence of intraocular lens: Secondary | ICD-10-CM | POA: Diagnosis not present

## 2022-12-12 DIAGNOSIS — H43813 Vitreous degeneration, bilateral: Secondary | ICD-10-CM | POA: Diagnosis not present

## 2022-12-12 DIAGNOSIS — H26493 Other secondary cataract, bilateral: Secondary | ICD-10-CM | POA: Diagnosis not present

## 2022-12-12 DIAGNOSIS — H04123 Dry eye syndrome of bilateral lacrimal glands: Secondary | ICD-10-CM | POA: Diagnosis not present

## 2022-12-18 ENCOUNTER — Other Ambulatory Visit: Payer: Self-pay | Admitting: Internal Medicine

## 2022-12-18 DIAGNOSIS — J449 Chronic obstructive pulmonary disease, unspecified: Secondary | ICD-10-CM

## 2022-12-29 ENCOUNTER — Ambulatory Visit: Payer: 59 | Admitting: Pulmonary Disease

## 2022-12-30 ENCOUNTER — Telehealth: Payer: Self-pay | Admitting: Internal Medicine

## 2022-12-30 NOTE — Telephone Encounter (Signed)
Lauren Bartlett from [employer] called stating they are changing the manufacturer for levothyroxine from amneal to lupin reference # [id number]

## 2022-12-30 NOTE — Telephone Encounter (Signed)
Verbal was given for the okay to change the manufacturer of the levothyroxine.

## 2023-01-06 DIAGNOSIS — J449 Chronic obstructive pulmonary disease, unspecified: Secondary | ICD-10-CM | POA: Diagnosis not present

## 2023-01-22 ENCOUNTER — Encounter: Payer: Self-pay | Admitting: Internal Medicine

## 2023-01-22 ENCOUNTER — Ambulatory Visit (INDEPENDENT_AMBULATORY_CARE_PROVIDER_SITE_OTHER): Payer: 59 | Admitting: Internal Medicine

## 2023-01-22 VITALS — BP 130/78 | HR 85 | Temp 97.4°F | Ht 62.0 in | Wt 247.8 lb

## 2023-01-22 DIAGNOSIS — R7301 Impaired fasting glucose: Secondary | ICD-10-CM

## 2023-01-22 DIAGNOSIS — I700 Atherosclerosis of aorta: Secondary | ICD-10-CM

## 2023-01-22 DIAGNOSIS — I2729 Other secondary pulmonary hypertension: Secondary | ICD-10-CM

## 2023-01-22 DIAGNOSIS — J440 Chronic obstructive pulmonary disease with acute lower respiratory infection: Secondary | ICD-10-CM

## 2023-01-22 DIAGNOSIS — I10 Essential (primary) hypertension: Secondary | ICD-10-CM

## 2023-01-22 DIAGNOSIS — J209 Acute bronchitis, unspecified: Secondary | ICD-10-CM | POA: Diagnosis not present

## 2023-01-22 DIAGNOSIS — Z79899 Other long term (current) drug therapy: Secondary | ICD-10-CM | POA: Diagnosis not present

## 2023-01-22 DIAGNOSIS — G4736 Sleep related hypoventilation in conditions classified elsewhere: Secondary | ICD-10-CM | POA: Diagnosis not present

## 2023-01-22 DIAGNOSIS — E034 Atrophy of thyroid (acquired): Secondary | ICD-10-CM

## 2023-01-22 DIAGNOSIS — E785 Hyperlipidemia, unspecified: Secondary | ICD-10-CM

## 2023-01-22 LAB — HEMOGLOBIN A1C: Hgb A1c MFr Bld: 5.5 % (ref 4.6–6.5)

## 2023-01-22 LAB — LIPID PANEL
Cholesterol: 199 mg/dL (ref 0–200)
HDL: 64.5 mg/dL (ref >39.00)
LDL Cholesterol: 115 mg/dL — ABNORMAL HIGH (ref 0–99)
NonHDL: 134.39
Total CHOL/HDL Ratio: 3
Triglycerides: 95 mg/dL (ref 0.0–149.0)
VLDL: 19 mg/dL (ref 0.0–40.0)

## 2023-01-22 LAB — MICROALBUMIN / CREATININE URINE RATIO
Creatinine,U: 40.9 mg/dL
Microalb Creat Ratio: 1.7 mg/g (ref 0.0–30.0)
Microalb, Ur: <0.7 mg/dL (ref 0.0–1.9)

## 2023-01-22 LAB — LDL CHOLESTEROL, DIRECT: Direct LDL: 99 mg/dL

## 2023-01-22 LAB — CBC WITH DIFFERENTIAL/PLATELET
Basophils Absolute: 0 10*3/uL (ref 0.0–0.1)
Basophils Relative: 0.7 % (ref 0.0–3.0)
Eosinophils Absolute: 0.2 10*3/uL (ref 0.0–0.7)
Eosinophils Relative: 3.1 % (ref 0.0–5.0)
HCT: 43.5 % (ref 36.0–46.0)
Hemoglobin: 13.9 g/dL (ref 12.0–15.0)
Lymphocytes Relative: 14.1 % (ref 12.0–46.0)
Lymphs Abs: 0.9 10*3/uL (ref 0.7–4.0)
MCHC: 31.9 g/dL (ref 30.0–36.0)
MCV: 94 fL (ref 78.0–100.0)
Monocytes Absolute: 0.6 10*3/uL (ref 0.1–1.0)
Monocytes Relative: 9.2 % (ref 3.0–12.0)
Neutro Abs: 4.8 10*3/uL (ref 1.4–7.7)
Neutrophils Relative %: 72.9 % (ref 43.0–77.0)
Platelets: 172 10*3/uL (ref 150.0–400.0)
RBC: 4.63 Mil/uL (ref 3.87–5.11)
RDW: 14.1 % (ref 11.5–15.5)
WBC: 6.6 10*3/uL (ref 4.0–10.5)

## 2023-01-22 LAB — COMPREHENSIVE METABOLIC PANEL
ALT: 15 U/L (ref 0–35)
AST: 16 U/L (ref 0–37)
Albumin: 4 g/dL (ref 3.5–5.2)
Alkaline Phosphatase: 77 U/L (ref 39–117)
BUN: 18 mg/dL (ref 6–23)
CO2: 34 meq/L — ABNORMAL HIGH (ref 19–32)
Calcium: 8.5 mg/dL (ref 8.4–10.5)
Chloride: 100 meq/L (ref 96–112)
Creatinine, Ser: 0.89 mg/dL (ref 0.40–1.20)
GFR: 67.22 mL/min (ref >60.00)
Glucose, Bld: 82 mg/dL (ref 70–99)
Potassium: 4.1 meq/L (ref 3.5–5.1)
Sodium: 141 meq/L (ref 135–145)
Total Bilirubin: 0.6 mg/dL (ref 0.2–1.2)
Total Protein: 6.7 g/dL (ref 6.0–8.3)

## 2023-01-22 LAB — TSH: TSH: 0.34 u[IU]/mL — ABNORMAL LOW (ref 0.35–5.50)

## 2023-01-22 MED ORDER — BUDESONIDE 0.25 MG/2ML IN SUSP: 0.2500 mg | Freq: Two times a day (BID) | RESPIRATORY_TRACT | 12 refills | Status: DC

## 2023-01-22 MED ORDER — DOXYCYCLINE HYCLATE 100 MG PO TABS: 100.0000 mg | ORAL_TABLET | Freq: Two times a day (BID) | ORAL | 0 refills | Status: DC

## 2023-01-22 MED ORDER — METHYLPREDNISOLONE ACETATE 40 MG/ML IJ SUSP
40.0000 mg | Freq: Once | INTRAMUSCULAR | Status: AC
  Administered 2023-01-22: 40 mg via INTRAMUSCULAR

## 2023-01-22 NOTE — Patient Instructions (Addendum)
You can use Any salt water nasal spray to moisturize your nose.   Look for "Ocean" if [facility] doesn't have AYR  Use Afrin for nosebleeds   I am treating you for bronchitis/asthma flare  with doxycycline.  This is an antibiotic  You received a methylprednisolone injection today to help with the wheezing since you do not tolerate prednisone  I am also getting you a new medicine to use in your nebulized called "budesonide" when you are having an asthma flare , but we have to get prior authorization from your insurance.

## 2023-01-22 NOTE — Progress Notes (Signed)
Subjective:  Patient ID: Lauren Bartlett, female    DOB: [birth year]  Age: 67 y.o. MRN: [id number]  CC: The primary encounter diagnosis was Primary hypertension. Diagnoses of Hypothyroidism due to acquired atrophy of thyroid, Hyperlipidemia, unspecified hyperlipidemia type, Impaired fasting glucose, Long-term use of high-risk medication, Acute bronchitis with COPD (HCC), Aortic atherosclerosis (HCC), Morbid obesity (HCC), and Nocturnal hypoxemia due to pulmonary hypertension (HCC) were also pertinent to this visit.   HPI Lauren Bartlett presents for  Chief Complaint  Patient presents with   Medical Management of Chronic Issues    C/o congestion cough   Recurrent epistaxis.  Managed with afrin.  Wears humidified oxygen at night. And using ayr  nasal spray   Cough and sinus congestion   sputum has been green today     Outpatient Medications Prior to Visit  Medication Sig Dispense Refill   acetaminophen (TYLENOL) 500 MG tablet Take 500 mg by mouth 2 (two) times daily as needed for moderate pain or headache.     albuterol (PROVENTIL) (2.5 MG/3ML) 0.083% nebulizer solution INHALE THE CONTENTS OF 1 VIAL VIA NEBULIZER EVERY 6 HOURS AS NEEDED FOR WHEEZING  OR FOR SHORTNESS OF BREATH 180 mL 6   albuterol (VENTOLIN HFA) 108 (90 Base) MCG/ACT inhaler INHALE 2 INHALATIONS BY  MOUTH INTO THE LUNGS EVERY  6 HOURS AS NEEDED FOR  WHEEZING OR SHORTNESS OF  BREATH (USE WITH SPACER) 36 g 6   aspirin EC 81 MG EC tablet Take 1 tablet (81 mg total) by mouth daily.     calcitRIOL (ROCALTROL) 0.25 MCG capsule Take 1 capsule (0.25 mcg total) by mouth daily. 100 capsule 2   Calcium Carb-Cholecalciferol 600-800 MG-UNIT TABS Take 1 tablet by mouth in the morning, at noon, and at bedtime.      docusate sodium (COLACE) 100 MG capsule Take 100 mg by mouth daily.     famotidine (PEPCID AC) 10 MG chewable tablet Chew 20 mg by mouth daily as needed for heartburn.      ferrous sulfate 325 (65 FE) MG tablet Take 325 mg by  mouth daily with breakfast.     Fluticasone-Umeclidin-Vilant (TRELEGY ELLIPTA) 200-62.5-25 MCG/ACT AEPB Inhale 1 puff into the lungs daily. 90 each 4   furosemide (LASIX) 20 MG tablet TAKE 1 TABLET BY MOUTH DAILY .  MAY TAKE AN ADDITIONAL TABLET AS NEEDED FOR WEIGHT GAIN OF 2 LBS  OVERNIGHT OR 5 LBS IN 1 WEEK 200 tablet 2   levothyroxine (SYNTHROID) 150 MCG tablet TAKE 1 TABLET BY MOUTH DAILY  BEFORE BREAKFAST 100 tablet 1   lidocaine (LIDODERM) 5 % Place 1 patch onto the skin every 12 (twelve) hours. Remove & Discard patch within 12 hours or as directed by MD 15 patch 0   loratadine (CLARITIN) 10 MG tablet Take 10 mg by mouth daily.     losartan (COZAAR) 50 MG tablet TAKE 1 TABLET BY MOUTH DAILY 100 tablet 1   OXYGEN Inhale 2 L into the lungs at bedtime as needed (at bedtime and as needed).     oxymetazoline (AFRIN) 0.05 % nasal spray Place 1 spray into both nostrils 2 (two) times daily. As needed for nose bleed     polyethylene glycol powder (GLYCOLAX/MIRALAX) powder Take 17 g by mouth 2 (two) times daily as needed. 3350 g 1   traMADol (ULTRAM) 50 MG tablet Take 1-2 tablets (50-100 mg total) by mouth every 4 (four) hours as needed for moderate pain. 30 tablet 0   vitamin  B-12 (CYANOCOBALAMIN) 1000 MCG tablet Take 1 tablet (1,000 mcg total) by mouth daily. 90 tablet 3   Vitamin D, Ergocalciferol, (DRISDOL) 1.25 MG (50000 UNIT) CAPS capsule TAKE 1 CAPSULE BY MOUTH 1 TIME A WEEK 12 capsule 3   No facility-administered medications prior to visit.    Review of Systems;  Patient denies headache, fevers, malaise, unintentional weight loss, skin rash, eye pain, sinus congestion and sinus pain, sore throat, dysphagia,  hemoptysis , cough, dyspnea, wheezing, chest pain, palpitations, orthopnea, edema, abdominal pain, nausea, melena, diarrhea, constipation, flank pain, dysuria, hematuria, urinary  Frequency, nocturia, numbness, tingling, seizures,  Focal weakness, Loss of consciousness,  Tremor, insomnia,  depression, anxiety, and suicidal ideation.      Objective:  BP 130/78   Pulse 85   Temp (!) 97.4 F (36.3 C) (Oral)   Ht 5\' 2"  (1.575 m)   Wt 247 lb 12.8 oz (112.4 kg)   SpO2 96%   BMI 45.32 kg/m   BP Readings from Last 3 Encounters:  01/22/23 130/78  08/19/22 130/74  08/14/22 110/70    Wt Readings from Last 3 Encounters:  01/22/23 247 lb 12.8 oz (112.4 kg)  08/19/22 250 lb 12.8 oz (113.8 kg)  08/14/22 249 lb 8 oz (113.2 kg)    Physical Exam  Lab Results  Component Value Date   HGBA1C 5.5 01/22/2023   HGBA1C 5.5 03/04/2022   HGBA1C 5.6 12/20/2020    Lab Results  Component Value Date   CREATININE 0.89 01/22/2023   CREATININE 0.85 08/19/2022   CREATININE 0.83 03/04/2022    Lab Results  Component Value Date   WBC 6.6 01/22/2023   HGB 13.9 01/22/2023   HCT 43.5 01/22/2023   PLT 172.0 01/22/2023   GLUCOSE 82 01/22/2023   CHOL 199 01/22/2023   TRIG 95.0 01/22/2023   HDL 64.50 01/22/2023   LDLDIRECT 99.0 01/22/2023   LDLCALC 115 (H) 01/22/2023   ALT 15 01/22/2023   AST 16 01/22/2023   NA 141 01/22/2023   K 4.1 01/22/2023   CL 100 01/22/2023   CREATININE 0.89 01/22/2023   BUN 18 01/22/2023   CO2 34 (H) 01/22/2023   TSH 0.34 (L) 01/22/2023   INR 1.1 (H) 06/13/2020   HGBA1C 5.5 01/22/2023   MICROALBUR <0.7 01/22/2023    No results found.  Assessment & Plan:  .Primary hypertension Assessment & Plan: Well controlled on current regimen of losartan 50 mg daily. Renal function stable, no changes today.  Orders: -     Comprehensive metabolic panel -     Microalbumin / creatinine urine ratio  Hypothyroidism due to acquired atrophy of thyroid -     TSH  Hyperlipidemia, unspecified hyperlipidemia type -     Lipid panel -     LDL cholesterol, direct  Impaired fasting glucose -     Comprehensive metabolic panel -     Hemoglobin A1c  Long-term use of high-risk medication -     Comprehensive metabolic panel -     CBC with  Differential/Platelet  Acute bronchitis with COPD (HCC) Assessment & Plan: Treating wit IM steroid injection and doxycycline   Orders: -     methylPREDNISolone Acetate  Aortic atherosclerosis (HCC) Assessment & Plan: She has deferred statin use repeatedly due to prior trials causing myalgias.    Morbid obesity (HCC) Assessment & Plan:  have addressed  BMI and recommended wt loss of 10% of body weight over the next 6 months using a low glycemic index diet and regular participation  in aerobic exercise a minimum of 30 minutes, a  minimum of 5 days per week.     Nocturnal hypoxemia due to pulmonary hypertension (HCC) Assessment & Plan: She Continues to require nocturnal use of supplemental oxygen at 2 L/min due to pulmonary hypertension    Other orders -     Doxycycline Hyclate; Take 1 tablet (100 mg total) by mouth 2 (two) times daily.  Dispense: 14 tablet; Refill: 0 -     Budesonide; Take 2 mLs (0.25 mg total) by nebulization in the morning and at bedtime.  Dispense: 60 mL; Refill: 12    Follow-up: Return in about 1 week (around 01/29/2023).   Sherlene Shams, MD

## 2023-01-24 NOTE — Assessment & Plan Note (Signed)
Well controlled on current regimen of losartan 50 mg daily. Renal function stable, no changes today. 

## 2023-01-24 NOTE — Assessment & Plan Note (Signed)
She has deferred statin use repeatedly due to prior trials causing myalgias.

## 2023-01-24 NOTE — Assessment & Plan Note (Signed)
She Continues to require nocturnal use of supplemental oxygen at 2 L/min due to pulmonary hypertension

## 2023-01-24 NOTE — Assessment & Plan Note (Signed)
have addressed  BMI and recommended wt loss of 10% of body weight over the next 6 months using a low glycemic index diet and regular participation in aerobic exercise a minimum of 30 minutes, a  minimum of 5 days per week.

## 2023-01-24 NOTE — Assessment & Plan Note (Signed)
Treating wit IM steroid injection and doxycycline

## 2023-01-25 MED ORDER — LEVOTHYROXINE SODIUM 125 MCG PO TABS: 125.0000 ug | ORAL_TABLET | Freq: Every day | ORAL | 3 refills | Status: DC

## 2023-01-25 NOTE — Addendum Note (Signed)
Addended by: Sherlene Shams on: 01/25/2023 10:49 PM   Modules accepted: Orders

## 2023-01-25 NOTE — Assessment & Plan Note (Signed)
Thyroid function is overactive on current 150 mcg dose. levothyroxine  I have sent a lower dose of levothyroxine to her pharmacy and would like patient to change immediatley.

## 2023-01-29 ENCOUNTER — Ambulatory Visit (INDEPENDENT_AMBULATORY_CARE_PROVIDER_SITE_OTHER): Payer: 59 | Admitting: Nurse Practitioner

## 2023-01-29 ENCOUNTER — Encounter: Payer: Self-pay | Admitting: Nurse Practitioner

## 2023-01-29 VITALS — BP 116/70 | HR 94 | Temp 97.4°F | Ht 62.0 in | Wt 246.6 lb

## 2023-01-29 DIAGNOSIS — J440 Chronic obstructive pulmonary disease with acute lower respiratory infection: Secondary | ICD-10-CM

## 2023-01-29 DIAGNOSIS — J209 Acute bronchitis, unspecified: Secondary | ICD-10-CM | POA: Diagnosis not present

## 2023-01-29 NOTE — Patient Instructions (Addendum)
Complete the course of doxycycline. Continue to take budesonide nebulizer in the morning and night for 1 week and then use as needed.

## 2023-01-29 NOTE — Progress Notes (Signed)
Established Patient Office Visit  Subjective:  Patient ID: Lauren Bartlett, female    DOB: [birth year]  Age: 67 y.o. MRN: [id number]  CC:  Chief Complaint  Patient presents with   Bronchitis   Follow-up    1 week bronchitis follow up    HPI  Lauren Bartlett presents for follow up on bronchitis. She has been taking doxycyline and  Budesonide nebulizer twice a day.  Denise wheezing, has some baseline SOB with exertion.   HPI   Past Medical History:  Diagnosis Date   Anxiety    Asthmatic bronchitis , chronic (HCC)    Bleeding from the nose    Chronic diastolic congestive heart failure (HCC)    a. 03/2016 Echo: EF 55-60%, Gr2 DD; b. 08/2016 Echo: EF 60-65%, nl diast fxn; c. 01/2019 Echo: EF 55-60%. Nl RV fxn.   Congenital absence of one kidney    COPD (chronic obstructive pulmonary disease) (HCC)    a. 02/2017 PFT: mod/sev obstructive airway dzs w/ significant bronchodilator response.   Depression    [facility]   Dyspnea    Emphysema/COPD [facility])    GERD (gastroesophageal reflux disease)    History of cardiac cath    a. 05/2016 Cath: nl cors.   History of sciatica    Hypertension    Hypothyroidism    Mitral Valve Prolapse & Severe mitral regurgitation s/p MVR    a. 03/2016 Echo: severe MVP involving the posterior leaflet, sev MR;  b. 06/2016 min invasive MV repair w/ triangular resection of flail segment (P2) of posterior leaflet, gore-tex neochord placement x 4, Sorin Memo 3D rin Annulosplasty (26mm, catalog # [id number], ser # [id number]); c. 08/2016 Echo: MV area 2.06 cm^2 (pressure 1/2 time); d. 01/2019 Echo: Mild MS w/ mean grad .   Motion sickness    cars   Pneumonia    PONV (postoperative nausea and vomiting)    Post-op Afib    a. 06/2016 following MV repair-->short-course amio. Coumadin d/c'd 2/2 epistaxis.   Pulmonary hypertension (HCC)    a. 08/2016 Echo: PASP ; b. 01/2019 Echo: RVSP 45.16mmHg.   S/P patent foramen ovale closure    a. 06/2016 @ time of MV  Repair.   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    Past Surgical History:  Procedure Laterality Date   ABDOMINAL HYSTERECTOMY     BREAST BIOPSY Right 2011   [facility]< benign   CATARACT EXTRACTION W/PHACO Left 11/14/2015   Procedure: CATARACT EXTRACTION PHACO AND INTRAOCULAR LENS PLACEMENT (IOC);  Surgeon: Lockie Mola, MD;  Location: [facility];  Service: Ophthalmology;  Laterality: Left;  sleep apnea Toric   CATARACT EXTRACTION W/PHACO Right 12/19/2015   Procedure: CATARACT EXTRACTION PHACO AND INTRAOCULAR LENS PLACEMENT (IOC);  Surgeon: Lockie Mola, MD;  Location: [facility];  Service: Ophthalmology;  Laterality: Right;  ANXIETY GENEROUS IV SEDATION TORIC LEN   COMBINED HYSTERECTOMY ABDOMINAL W/ A&P REPAIR / OOPHORECTOMY  1996   benign tumor   INNER EAR SURGERY     bilateral   MITRAL VALVE REPAIR Right 07/17/2016   Procedure: MINIMALLY INVASIVE MITRAL VALVE REPAIR (MVR) USING 26 SORIN MEMO 3D ANNULOPLASTY RING;  Surgeon: Purcell Nails, MD;  Location: [facility] OR;  Service: Open Heart Surgery;  Laterality: Right;   PATENT FORAMEN OVALE(PFO) CLOSURE N/A 07/17/2016   Procedure: PATENT FORAMEN OVALE (PFO) CLOSURE;  Surgeon: Purcell Nails, MD;  Location: [facility] OR;  Service: Open Heart  Surgery;  Laterality: N/A;   RIGHT/LEFT HEART CATH AND CORONARY ANGIOGRAPHY Bilateral 06/16/2016   Procedure: Right/Left Heart Cath and Coronary Angiography;  Surgeon: Iran Ouch, MD;  Location: [facility];  Service: Cardiovascular;  Laterality: Bilateral;   TEE WITHOUT CARDIOVERSION N/A 05/19/2016   Procedure: TRANSESOPHAGEAL ECHOCARDIOGRAM (TEE);  Surgeon: Iran Ouch, MD;  Location: [facility] ORS;  Service: Cardiovascular;  Laterality: N/A;   TEE WITHOUT CARDIOVERSION N/A 06/09/2016   Procedure: TRANSESOPHAGEAL ECHOCARDIOGRAM (TEE);  Surgeon: Iran Ouch, MD;  Location: [facility] ORS;  Service: Cardiovascular;  Laterality: N/A;   TEE WITHOUT  CARDIOVERSION N/A 06/16/2016   Procedure: TRANSESOPHAGEAL ECHOCARDIOGRAM (TEE);  Surgeon: Iran Ouch, MD;  Location: [facility] ORS;  Service: Cardiovascular;  Laterality: N/A;   TEE WITHOUT CARDIOVERSION N/A 07/17/2016   Procedure: TRANSESOPHAGEAL ECHOCARDIOGRAM (TEE);  Surgeon: Purcell Nails, MD;  Location: [facility] OR;  Service: Open Heart Surgery;  Laterality: N/A;   TONSILLECTOMY      Family History  Problem Relation Age of Onset   Multiple sclerosis Mother    Hypertension Mother    Coronary artery disease Father    Heart disease Father    Hypertension Father    Heart disease Brother    Cancer Brother    Breast cancer Paternal Aunt     Social History   Socioeconomic History   Marital status: Single    Spouse name: Not on file   Number of children: Not on file   Years of education: Not on file   Highest education level: Not on file  Occupational History   Occupation: cna    Comment: part time  Tobacco Use   Smoking status: Never   Smokeless tobacco: Never  Vaping Use   Vaping status: Never Used  Substance and Sexual Activity   Alcohol use: No   Drug use: No   Sexual activity: Not Currently  Other Topics Concern   Not on file  Social History Narrative   Lives in a group home for adults         Social Determinants of Health   Financial Resource Strain: Low Risk  (02/19/2022)   Overall Financial Resource Strain (CARDIA)    Difficulty of Paying Living Expenses: Not hard at all  Food Insecurity: No Food Insecurity (02/19/2022)   Hunger Vital Sign    Worried About Running Out of Food in the Last Year: Never true    Ran Out of Food in the Last Year: Never true  Transportation Needs: No Transportation Needs (02/19/2022)   PRAPARE - Administrator, Civil Service (Medical): No    Lack of Transportation (Non-Medical): No  Physical Activity: Insufficiently Active (02/19/2022)   Exercise Vital Sign    Days of Exercise per Week: 7 days    Minutes of Exercise  per Session: 20 min  Stress: No Stress Concern Present (02/19/2022)   [employer] of Occupational Health - Occupational Stress Questionnaire    Feeling of Stress : Not at all  Social Connections: Unknown (02/19/2022)   Social Connection and Isolation Panel [NHANES]    Frequency of Communication with Friends and Family: More than three times a week    Frequency of Social Gatherings with Friends and Family: More than three times a week    Attends Religious Services: More than 4 times per year    Active Member of [employer] or Organizations: Not on file    Attends Banker Meetings: Not on file    Marital  Status: Never married  Intimate Partner Violence: Not At Risk (02/19/2022)   Humiliation, Afraid, Rape, and Kick questionnaire    Fear of Current or Ex-Partner: No    Emotionally Abused: No    Physically Abused: No    Sexually Abused: No     Outpatient Medications Prior to Visit  Medication Sig Dispense Refill   acetaminophen (TYLENOL) 500 MG tablet Take 500 mg by mouth 2 (two) times daily as needed for moderate pain or headache.     albuterol (PROVENTIL) (2.5 MG/3ML) 0.083% nebulizer solution INHALE THE CONTENTS OF 1 VIAL VIA NEBULIZER EVERY 6 HOURS AS NEEDED FOR WHEEZING  OR FOR SHORTNESS OF BREATH 180 mL 6   albuterol (VENTOLIN HFA) 108 (90 Base) MCG/ACT inhaler INHALE 2 INHALATIONS BY  MOUTH INTO THE LUNGS EVERY  6 HOURS AS NEEDED FOR  WHEEZING OR SHORTNESS OF  BREATH (USE WITH SPACER) 36 g 6   aspirin EC 81 MG EC tablet Take 1 tablet (81 mg total) by mouth daily.     budesonide (PULMICORT) 0.25 MG/2ML nebulizer solution Take 2 mLs (0.25 mg total) by nebulization in the morning and at bedtime. 60 mL 12   calcitRIOL (ROCALTROL) 0.25 MCG capsule Take 1 capsule (0.25 mcg total) by mouth daily. 100 capsule 2   Calcium Carb-Cholecalciferol 600-800 MG-UNIT TABS Take 1 tablet by mouth in the morning, at noon, and at bedtime.      docusate sodium (COLACE) 100 MG capsule Take 100  mg by mouth daily.     doxycycline (VIBRA-TABS) 100 MG tablet Take 1 tablet (100 mg total) by mouth 2 (two) times daily. 14 tablet 0   famotidine (PEPCID AC) 10 MG chewable tablet Chew 20 mg by mouth daily as needed for heartburn.      ferrous sulfate 325 (65 FE) MG tablet Take 325 mg by mouth daily with breakfast.     Fluticasone-Umeclidin-Vilant (TRELEGY ELLIPTA) 200-62.5-25 MCG/ACT AEPB Inhale 1 puff into the lungs daily. 90 each 4   furosemide (LASIX) 20 MG tablet TAKE 1 TABLET BY MOUTH DAILY .  MAY TAKE AN ADDITIONAL TABLET AS NEEDED FOR WEIGHT GAIN OF 2 LBS  OVERNIGHT OR 5 LBS IN 1 WEEK 200 tablet 2   levothyroxine (SYNTHROID) 125 MCG tablet Take 1 tablet (125 mcg total) by mouth daily. 90 tablet 3   lidocaine (LIDODERM) 5 % Place 1 patch onto the skin every 12 (twelve) hours. Remove & Discard patch within 12 hours or as directed by MD 15 patch 0   loratadine (CLARITIN) 10 MG tablet Take 10 mg by mouth daily.     losartan (COZAAR) 50 MG tablet TAKE 1 TABLET BY MOUTH DAILY 100 tablet 1   OXYGEN Inhale 2 L into the lungs at bedtime as needed (at bedtime and as needed).     oxymetazoline (AFRIN) 0.05 % nasal spray Place 1 spray into both nostrils 2 (two) times daily. As needed for nose bleed     polyethylene glycol powder (GLYCOLAX/MIRALAX) powder Take 17 g by mouth 2 (two) times daily as needed. 3350 g 1   traMADol (ULTRAM) 50 MG tablet Take 1-2 tablets (50-100 mg total) by mouth every 4 (four) hours as needed for moderate pain. 30 tablet 0   vitamin B-12 (CYANOCOBALAMIN) 1000 MCG tablet Take 1 tablet (1,000 mcg total) by mouth daily. 90 tablet 3   Vitamin D, Ergocalciferol, (DRISDOL) 1.25 MG (50000 UNIT) CAPS capsule TAKE 1 CAPSULE BY MOUTH 1 TIME A WEEK 12 capsule 3  No facility-administered medications prior to visit.    Allergies  Allergen Reactions   Codeine Hives and Nausea And Vomiting   Levaquin [Levofloxacin] Nausea And Vomiting   Penicillins Hives, Itching and Other (See  Comments)    Has patient had a PCN reaction causing immediate rash, facial/tongue/throat swelling, SOB or lightheadedness with hypotension: No Has patient had a PCN reaction causing severe rash involving mucus membranes or skin necrosis: No Has patient had a PCN reaction that required hospitalization No Has patient had a PCN reaction occurring within the last 10 years: No If all of the above answers are "NO", then may proceed with Cephalosporin use.   Zithromax [Azithromycin] Other (See Comments)    Reaction:  Hallucinations    Prednisone Palpitations and Other (See Comments)    Reaction:  Hallucinations  Can take injections but not oral meds    ROS Review of Systems Negative unless indicated in HPI.    Objective:    Physical Exam HENT:     Right Ear: Tympanic membrane normal. Tympanic membrane is not erythematous.     Left Ear: Tympanic membrane normal. Tympanic membrane is not erythematous.     Nose:     Right Turbinates: Not enlarged.     Left Turbinates: Not enlarged.     Right Sinus: No maxillary sinus tenderness or frontal sinus tenderness.     Left Sinus: No maxillary sinus tenderness or frontal sinus tenderness.     Mouth/Throat:     Mouth: Mucous membranes are moist.     Pharynx: No pharyngeal swelling, oropharyngeal exudate or posterior oropharyngeal erythema.     Tonsils: No tonsillar exudate.  Cardiovascular:     Rate and Rhythm: Normal rate and regular rhythm.  Pulmonary:     Effort: Pulmonary effort is normal.     Breath sounds: Normal breath sounds. No stridor. No wheezing.  Neurological:     General: No focal deficit present.     Mental Status: She is oriented to person, place, and time. Mental status is at baseline.  Psychiatric:        Mood and Affect: Mood normal.        Behavior: Behavior normal.        Thought Content: Thought content normal.        Judgment: Judgment normal.     BP 116/70   Pulse 94   Temp (!) 97.4 F (36.3 C)   Ht 5\' 2"   (1.575 m)   Wt 246 lb 9.6 oz (111.9 kg)   SpO2 95%   BMI 45.10 kg/m  Wt Readings from Last 3 Encounters:  01/29/23 246 lb 9.6 oz (111.9 kg)  01/22/23 247 lb 12.8 oz (112.4 kg)  08/19/22 250 lb 12.8 oz (113.8 kg)     Health Maintenance  Topic Date Due   Zoster Vaccines- Shingrix (1 of 2) Never done   DTaP/Tdap/Td (2 - Td or Tdap) 11/13/2019   Fecal DNA (Cologuard)  01/26/2023   Medicare Annual Wellness (AWV)  02/20/2023   COVID-19 Vaccine (3 - Pfizer risk series) 02/06/2023 (Originally 10/05/2019)   INFLUENZA VACCINE  06/29/2023 (Originally 10/30/2022)   Pneumonia Vaccine 40+ Years old (3 of 3 - PPSV23 or PCV20) 04/12/2024   MAMMOGRAM  06/11/2024   DEXA SCAN  Completed   Hepatitis C Screening  Completed   HPV VACCINES  Aged Out    There are no preventive care reminders to display for this patient.  Lab Results  Component Value Date   TSH 0.34 (  L) 01/22/2023   Lab Results  Component Value Date   WBC 6.6 01/22/2023   HGB 13.9 01/22/2023   HCT 43.5 01/22/2023   MCV 94.0 01/22/2023   PLT 172.0 01/22/2023   Lab Results  Component Value Date   NA 141 01/22/2023   K 4.1 01/22/2023   CO2 34 (H) 01/22/2023   GLUCOSE 82 01/22/2023   BUN 18 01/22/2023   CREATININE 0.89 01/22/2023   BILITOT 0.6 01/22/2023   ALKPHOS 77 01/22/2023   AST 16 01/22/2023   ALT 15 01/22/2023   PROT 6.7 01/22/2023   ALBUMIN 4.0 01/22/2023   CALCIUM 8.5 01/22/2023   ANIONGAP 10 11/28/2021   GFR 67.22 01/22/2023   Lab Results  Component Value Date   CHOL 199 01/22/2023   Lab Results  Component Value Date   HDL 64.50 01/22/2023   Lab Results  Component Value Date   LDLCALC 115 (H) 01/22/2023   Lab Results  Component Value Date   TRIG 95.0 01/22/2023   Lab Results  Component Value Date   CHOLHDL 3 01/22/2023   Lab Results  Component Value Date   HGBA1C 5.5 01/22/2023      Assessment & Plan:  Acute bronchitis with COPD (HCC) Assessment & Plan: Course of doxycycline and  continue to use budesonide nebulizer twice daily for the week and then as needed. Patient will let us know if symptoms do not improve.     Follow-up: Return in about 4 months (around 05/29/2023) for  chronic management PCP and sooner if symptoms worsen or fail to improve.   Kara Dies, NP

## 2023-02-06 DIAGNOSIS — J449 Chronic obstructive pulmonary disease, unspecified: Secondary | ICD-10-CM | POA: Diagnosis not present

## 2023-02-06 NOTE — Assessment & Plan Note (Signed)
Course of doxycycline and continue to use budesonide nebulizer twice daily for the week and then as needed. Patient will let us know if symptoms do not improve.

## 2023-02-12 ENCOUNTER — Telehealth: Payer: Self-pay

## 2023-02-12 DIAGNOSIS — Z1211 Encounter for screening for malignant neoplasm of colon: Secondary | ICD-10-CM

## 2023-02-12 NOTE — Telephone Encounter (Signed)
Patient states she received a letter telling her to call her PCP to order a cologuard test.  Patient states she would like for Dr. Duncan Dull to order her a cologuard test.  Patient states she just saw Dr. Darrick Huntsman a couple of weeks ago for this issue.

## 2023-02-12 NOTE — Telephone Encounter (Signed)
Cologuard has been ordered. 

## 2023-02-23 ENCOUNTER — Ambulatory Visit (INDEPENDENT_AMBULATORY_CARE_PROVIDER_SITE_OTHER): Payer: 59 | Admitting: *Deleted

## 2023-02-23 VITALS — Ht 62.0 in | Wt 248.0 lb

## 2023-02-23 DIAGNOSIS — Z0000 Encounter for general adult medical examination without abnormal findings: Secondary | ICD-10-CM | POA: Diagnosis not present

## 2023-02-23 NOTE — Patient Instructions (Signed)
Lauren Bartlett , Thank you for taking time to come for your Medicare Wellness Visit. I appreciate your ongoing commitment to your health goals. Please review the following plan we discussed and let me know if I can assist you in the future.   Referrals/Orders/Follow-Ups/Clinician Recommendations: Consider updating your vaccines  This is a list of the screening recommended for you and due dates:  Health Maintenance  Topic Date Due   Zoster (Shingles) Vaccine (1 of 2) Never done   COVID-19 Vaccine (3 - Pfizer risk series) 10/05/2019   DTaP/Tdap/Td vaccine (2 - Td or Tdap) 11/13/2019   Cologuard (Stool DNA test)  01/26/2023   Flu Shot  06/29/2023*   Medicare Annual Wellness Visit  02/23/2024   Pneumonia Vaccine (3 of 3 - PPSV23 or PCV20) 04/12/2024   Mammogram  06/11/2024   DEXA scan (bone density measurement)  Completed   Hepatitis C Screening  Completed   HPV Vaccine  Aged Out  *Topic was postponed. The date shown is not the original due date.    Advanced directives: (Declined) Advance directive discussed with you today. Even though you declined this today, please call our office should you change your mind, and we can give you the proper paperwork for you to fill out.  Next Medicare Annual Wellness Visit scheduled for next year: Yes 02/29/24 @ 9:30  Managing Pain Without Opioids Opioids are strong medicines used to treat moderate to severe pain. For some people, especially those who have long-term (chronic) pain, opioids may not be the best choice for pain management due to: Side effects like nausea, constipation, and sleepiness. The risk of addiction (opioid use disorder). The longer you take opioids, the greater your risk of addiction. Pain that lasts for more than 3 months is called chronic pain. Managing chronic pain usually requires more than one approach and is often provided by a team of health care providers working together (multidisciplinary approach). Pain management may be done at  a pain management center or pain clinic. How to manage pain without the use of opioids Use non-opioid medicines Non-opioid medicines for pain may include: Over-the-counter or prescription non-steroidal anti-inflammatory drugs (NSAIDs). These may be the first medicines used for pain. They work well for muscle and bone pain, and they reduce swelling. Acetaminophen. This over-the-counter medicine may work well for milder pain but not swelling. Antidepressants. These may be used to treat chronic pain. A certain type of antidepressant (tricyclics) is often used. These medicines are given in lower doses for pain than when used for depression. Anticonvulsants. These are usually used to treat seizures but may also reduce nerve (neuropathic) pain. Muscle relaxants. These relieve pain caused by sudden muscle tightening (spasms). You may also use a pain medicine that is applied to the skin as a patch, cream, or gel (topical analgesic), such as a numbing medicine. These may cause fewer side effects than medicines taken by mouth. Do certain therapies as directed Some therapies can help with pain management. They include: Physical therapy. You will do exercises to gain strength and flexibility. A physical therapist may teach you exercises to move and stretch parts of your body that are weak, stiff, or painful. You can learn these exercises at physical therapy visits and practice them at home. Physical therapy may also involve: Massage. Heat wraps or applying heat or cold to affected areas. Electrical signals that interrupt pain signals (transcutaneous electrical nerve stimulation, TENS). Weak lasers that reduce pain and swelling (low-level laser therapy). Signals from your body that  help you learn to regulate pain (biofeedback). Occupational therapy. This helps you to learn ways to function at home and work with less pain. Recreational therapy. This involves trying new activities or hobbies, such as a physical  activity or drawing. Mental health therapy, including: Cognitive behavioral therapy (CBT). This helps you learn coping skills for dealing with pain. Acceptance and commitment therapy (ACT) to change the way you think and react to pain. Relaxation therapies, including muscle relaxation exercises and mindfulness-based stress reduction. Pain management counseling. This may be individual, family, or group counseling.  Receive medical treatments Medical treatments for pain management include: Nerve block injections. These may include a pain blocker and anti-inflammatory medicines. You may have injections: Near the spine to relieve chronic back or neck pain. Into joints to relieve back or joint pain. Into nerve areas that supply a painful area to relieve body pain. Into muscles (trigger point injections) to relieve some painful muscle conditions. A medical device placed near your spine to help block pain signals and relieve nerve pain or chronic back pain (spinal cord stimulation device). Acupuncture. Follow these instructions at home Medicines Take over-the-counter and prescription medicines only as told by your health care provider. If you are taking pain medicine, ask your health care providers about possible side effects to watch out for. Do not drive or use heavy machinery while taking prescription opioid pain medicine. Lifestyle  Do not use drugs or alcohol to reduce pain. If you drink alcohol, limit how much you have to: 0-1 drink a day for women who are not pregnant. 0-2 drinks a day for men. Know how much alcohol is in a drink. In the [place] one drink equals one 12 oz bottle of beer (355 mL), one 5 oz glass of wine (148 mL), or one 1 oz glass of hard liquor (44 mL). Do not use any products that contain nicotine or tobacco. These products include cigarettes, chewing tobacco, and vaping devices, such as e-cigarettes. If you need help quitting, ask your health care provider. Eat a healthy  diet and maintain a healthy weight. Poor diet and excess weight may make pain worse. Eat foods that are high in fiber. These include fresh fruits and vegetables, whole grains, and beans. Limit foods that are high in fat and processed sugars, such as fried and sweet foods. Exercise regularly. Exercise lowers stress and may help relieve pain. Ask your health care provider what activities and exercises are safe for you. If your health care provider approves, join an exercise class that combines movement and stress reduction. Examples include yoga and tai chi. Get enough sleep. Lack of sleep may make pain worse. Lower stress as much as possible. Practice stress reduction techniques as told by your therapist. General instructions Work with all your pain management providers to find the treatments that work best for you. You are an important member of your pain management team. There are many things you can do to reduce pain on your own. Consider joining an online or in-person support group for people who have chronic pain. Keep all follow-up visits. This is important. Where to find more information You can find more information about managing pain without opioids from: American Academy of Pain Medicine: painmed.org Institute for Chronic Pain: instituteforchronicpain.org American Chronic Pain Association: theacpa.org Contact a health care provider if: You have side effects from pain medicine. Your pain gets worse or does not get better with treatments or home therapy. You are struggling with anxiety or depression. Summary Many types  of pain can be managed without opioids. Chronic pain may respond better to pain management without opioids. Pain is best managed when you and a team of health care providers work together. Pain management without opioids may include non-opioid medicines, medical treatments, physical therapy, mental health therapy, and lifestyle changes. Tell your health care providers  if your pain gets worse or is not being managed well enough. This information is not intended to replace advice given to you by your health care provider. Make sure you discuss any questions you have with your health care provider. Document Revised: 06/27/2020 Document Reviewed: 06/27/2020 Elsevier Patient Education  2024 [employer].

## 2023-02-23 NOTE — Progress Notes (Cosign Needed)
Subjective:   Lauren Bartlett is a 67 y.o. female who presents for Medicare Annual (Subsequent) preventive examination.  Visit Complete: Virtual I connected with  Lauren Bartlett on 02/23/23 by a audio enabled telemedicine application and verified that I am speaking with the correct person using two identifiers.  Patient Location: Home  Provider Location: Office/Clinic  I discussed the limitations of evaluation and management by telemedicine. The patient expressed understanding and agreed to proceed.  Vital Signs: Because this visit was a virtual/telehealth visit, some criteria may be missing or patient reported. Any vitals not documented were not able to be obtained and vitals that have been documented are patient reported.  Cardiac Risk Factors include: advanced age (>9men, >75 women);obesity (BMI >30kg/m2);hypertension     Objective:    Today's Vitals   02/23/23 1038  Weight: 248 lb (112.5 kg)  Height: 5\' 2"  (1.575 m)   Body mass index is 45.36 kg/m.     02/23/2023   10:53 AM 02/19/2022    3:50 PM 12/22/2021    2:58 PM 11/28/2021   10:54 AM 06/18/2021   10:24 AM 02/18/2021   12:43 PM 11/26/2020    2:19 PM  Advanced Directives  Does Patient Have a Medical Advance Directive? No No No No No No No  Would patient like information on creating a medical advance directive? No - Patient declined No - Patient declined    No - Patient declined     Current Medications (verified) Outpatient Encounter Medications as of 02/23/2023  Medication Sig   acetaminophen (TYLENOL) 500 MG tablet Take 500 mg by mouth 2 (two) times daily as needed for moderate pain or headache.   albuterol (PROVENTIL) (2.5 MG/3ML) 0.083% nebulizer solution INHALE THE CONTENTS OF 1 VIAL VIA NEBULIZER EVERY 6 HOURS AS NEEDED FOR WHEEZING  OR FOR SHORTNESS OF BREATH   albuterol (VENTOLIN HFA) 108 (90 Base) MCG/ACT inhaler INHALE 2 INHALATIONS BY  MOUTH INTO THE LUNGS EVERY  6 HOURS AS NEEDED FOR  WHEEZING OR SHORTNESS  OF  BREATH (USE WITH SPACER)   aspirin EC 81 MG EC tablet Take 1 tablet (81 mg total) by mouth daily.   budesonide (PULMICORT) 0.25 MG/2ML nebulizer solution Take 2 mLs (0.25 mg total) by nebulization in the morning and at bedtime.   calcitRIOL (ROCALTROL) 0.25 MCG capsule Take 1 capsule (0.25 mcg total) by mouth daily.   Calcium Carb-Cholecalciferol 600-800 MG-UNIT TABS Take 1 tablet by mouth in the morning, at noon, and at bedtime.    docusate sodium (COLACE) 100 MG capsule Take 100 mg by mouth daily.   famotidine (PEPCID AC) 10 MG chewable tablet Chew 20 mg by mouth daily as needed for heartburn.    ferrous sulfate 325 (65 FE) MG tablet Take 325 mg by mouth daily with breakfast.   Fluticasone-Umeclidin-Vilant (TRELEGY ELLIPTA) 200-62.5-25 MCG/ACT AEPB Inhale 1 puff into the lungs daily.   furosemide (LASIX) 20 MG tablet TAKE 1 TABLET BY MOUTH DAILY .  MAY TAKE AN ADDITIONAL TABLET AS NEEDED FOR WEIGHT GAIN OF 2 LBS  OVERNIGHT OR 5 LBS IN 1 WEEK   levothyroxine (SYNTHROID) 125 MCG tablet Take 1 tablet (125 mcg total) by mouth daily.   lidocaine (LIDODERM) 5 % Place 1 patch onto the skin every 12 (twelve) hours. Remove & Discard patch within 12 hours or as directed by MD   loratadine (CLARITIN) 10 MG tablet Take 10 mg by mouth daily.   losartan (COZAAR) 50 MG tablet TAKE 1 TABLET BY  MOUTH DAILY   OXYGEN Inhale 2 L into the lungs at bedtime as needed (at bedtime and as needed).   oxymetazoline (AFRIN) 0.05 % nasal spray Place 1 spray into both nostrils 2 (two) times daily. As needed for nose bleed   polyethylene glycol powder (GLYCOLAX/MIRALAX) powder Take 17 g by mouth 2 (two) times daily as needed.   traMADol (ULTRAM) 50 MG tablet Take 1-2 tablets (50-100 mg total) by mouth every 4 (four) hours as needed for moderate pain.   vitamin B-12 (CYANOCOBALAMIN) 1000 MCG tablet Take 1 tablet (1,000 mcg total) by mouth daily.   Vitamin D, Ergocalciferol, (DRISDOL) 1.25 MG (50000 UNIT) CAPS capsule TAKE 1  CAPSULE BY MOUTH 1 TIME A WEEK   doxycycline (VIBRA-TABS) 100 MG tablet Take 1 tablet (100 mg total) by mouth 2 (two) times daily. (Patient not taking: Reported on 02/23/2023)   No facility-administered encounter medications on file as of 02/23/2023.    Allergies (verified) Codeine, Levaquin [levofloxacin], Penicillins, Zithromax [azithromycin], and Prednisone   History: Past Medical History:  Diagnosis Date   Anxiety    Asthmatic bronchitis , chronic (HCC)    Bleeding from the nose    Chronic diastolic congestive heart failure (HCC)    a. 03/2016 Echo: EF 55-60%, Gr2 DD; b. 08/2016 Echo: EF 60-65%, nl diast fxn; c. 01/2019 Echo: EF 55-60%. Nl RV fxn.   Congenital absence of one kidney    COPD (chronic obstructive pulmonary disease) (HCC)    a. 02/2017 PFT: mod/sev obstructive airway dzs w/ significant bronchodilator response.   Depression    [facility]   Dyspnea    Emphysema/COPD [facility])    GERD (gastroesophageal reflux disease)    History of cardiac cath    a. 05/2016 Cath: nl cors.   History of sciatica    Hypertension    Hypothyroidism    Mitral Valve Prolapse & Severe mitral regurgitation s/p MVR    a. 03/2016 Echo: severe MVP involving the posterior leaflet, sev MR;  b. 06/2016 min invasive MV repair w/ triangular resection of flail segment (P2) of posterior leaflet, gore-tex neochord placement x 4, Sorin Memo 3D rin Annulosplasty (26mm, catalog # [id number], ser # [id number]); c. 08/2016 Echo: MV area 2.06 cm^2 (pressure 1/2 time); d. 01/2019 Echo: Mild MS w/ mean grad .   Motion sickness    cars   Pneumonia    PONV (postoperative nausea and vomiting)    Post-op Afib    a. 06/2016 following MV repair-->short-course amio. Coumadin d/c'd 2/2 epistaxis.   Pulmonary hypertension (HCC)    a. 08/2016 Echo: PASP ; b. 01/2019 Echo: RVSP 45.35mmHg.   S/P patent foramen ovale closure    a. 06/2016 @ time of MV Repair.   Sleep apnea    O2 at night and PRN   Tracheomalacia  02/29/2016   Past Surgical History:  Procedure Laterality Date   ABDOMINAL HYSTERECTOMY     BREAST BIOPSY Right 2011   [facility]< benign   CATARACT EXTRACTION W/PHACO Left 11/14/2015   Procedure: CATARACT EXTRACTION PHACO AND INTRAOCULAR LENS PLACEMENT (IOC);  Surgeon: Lockie Mola, MD;  Location: [facility];  Service: Ophthalmology;  Laterality: Left;  sleep apnea Toric   CATARACT EXTRACTION W/PHACO Right 12/19/2015   Procedure: CATARACT EXTRACTION PHACO AND INTRAOCULAR LENS PLACEMENT (IOC);  Surgeon: Lockie Mola, MD;  Location: [facility];  Service: Ophthalmology;  Laterality: Right;  ANXIETY GENEROUS IV SEDATION TORIC LEN   COMBINED HYSTERECTOMY ABDOMINAL W/ A&P REPAIR / OOPHORECTOMY  1996   benign tumor   INNER EAR SURGERY     bilateral   MITRAL VALVE REPAIR Right 07/17/2016   Procedure: MINIMALLY INVASIVE MITRAL VALVE REPAIR (MVR) USING 26 SORIN MEMO 3D ANNULOPLASTY RING;  Surgeon: Purcell Nails, MD;  Location: [facility] OR;  Service: Open Heart Surgery;  Laterality: Right;   PATENT FORAMEN OVALE(PFO) CLOSURE N/A 07/17/2016   Procedure: PATENT FORAMEN OVALE (PFO) CLOSURE;  Surgeon: Purcell Nails, MD;  Location: [facility] OR;  Service: Open Heart Surgery;  Laterality: N/A;   RIGHT/LEFT HEART CATH AND CORONARY ANGIOGRAPHY Bilateral 06/16/2016   Procedure: Right/Left Heart Cath and Coronary Angiography;  Surgeon: Iran Ouch, MD;  Location: [facility] INVASIVE [facility] LAB;  Service: Cardiovascular;  Laterality: Bilateral;   TEE WITHOUT CARDIOVERSION N/A 05/19/2016   Procedure: TRANSESOPHAGEAL ECHOCARDIOGRAM (TEE);  Surgeon: Iran Ouch, MD;  Location: [facility] ORS;  Service: Cardiovascular;  Laterality: N/A;   TEE WITHOUT CARDIOVERSION N/A 06/09/2016   Procedure: TRANSESOPHAGEAL ECHOCARDIOGRAM (TEE);  Surgeon: Iran Ouch, MD;  Location: [facility] ORS;  Service: Cardiovascular;  Laterality: N/A;   TEE WITHOUT CARDIOVERSION N/A 06/16/2016   Procedure: TRANSESOPHAGEAL  ECHOCARDIOGRAM (TEE);  Surgeon: Iran Ouch, MD;  Location: [facility] ORS;  Service: Cardiovascular;  Laterality: N/A;   TEE WITHOUT CARDIOVERSION N/A 07/17/2016   Procedure: TRANSESOPHAGEAL ECHOCARDIOGRAM (TEE);  Surgeon: Purcell Nails, MD;  Location[facility] OR;  Service: Open Heart Surgery;  Laterality: N/A;   TONSILLECTOMY     Family History  Problem Relation Age of Onset   Multiple sclerosis Mother    Hypertension Mother    Coronary artery disease Father    Heart disease Father    Hypertension Father    Heart disease Brother    Cancer Brother    Breast cancer Paternal Aunt    Social History   Socioeconomic History   Marital status: Single    Spouse name: Not on file   Number of children: Not on file   Years of education: Not on file   Highest education level: Not on file  Occupational History   Occupation: cna    Comment: part time  Tobacco Use   Smoking status: Never   Smokeless tobacco: Never  Vaping Use   Vaping status: Never Used  Substance and Sexual Activity   Alcohol use: No   Drug use: No   Sexual activity: Not Currently  Other Topics Concern   Not on file  Social History Narrative   Lives in a group home for adults         Social Determinants of Health   Financial Resource Strain: Low Risk  (02/23/2023)   Overall Financial Resource Strain (CARDIA)    Difficulty of Paying Living Expenses: Not hard at all  Food Insecurity: No Food Insecurity (02/23/2023)   Hunger Vital Sign    Worried About Running Out of Food in the Last Year: Never true    Ran Out of Food in the Last Year: Never true  Transportation Needs: No Transportation Needs (02/23/2023)   PRAPARE - Administrator, Civil Service (Medical): No    Lack of Transportation (Non-Medical): No  Physical Activity: Inactive (02/23/2023)   Exercise Vital Sign    Days of Exercise per Week: 0 days    Minutes of Exercise per Session: 0 min  Stress: No Stress Concern Present (02/23/2023)    [employer] of Occupational Health - Occupational Stress Questionnaire    Feeling of Stress : Only a little  Social  Connections: Moderately Isolated (02/23/2023)   Social Connection and Isolation Panel [NHANES]    Frequency of Communication with Friends and Family: More than three times a week    Frequency of Social Gatherings with Friends and Family: Twice a week    Attends Religious Services: More than 4 times per year    Active Member of [employer] or Organizations: No    Attends Engineer, structural: Never    Marital Status: Never married    Tobacco Counseling Counseling given: Not Answered   Clinical Intake:  Pre-visit preparation completed: Yes  Pain : No/denies pain     BMI - recorded: 45.36 Nutritional Status: BMI > 30  Obese Nutritional Risks: None Diabetes: No  How often do you need to have someone help you when you read instructions, pamphlets, or other written materials from your doctor or pharmacy?: 1 - Never  Interpreter Needed?: No  Information entered by :: R. Zeeshan Korte LPN   Activities of Daily Living    02/23/2023   10:40 AM  In your present state of health, do you have any difficulty performing the following activities:  Hearing? 1  Comment wears aids  Vision? 0  Comment readers  Difficulty concentrating or making decisions? 0  Walking or climbing stairs? 1  Dressing or bathing? 1  Doing errands, shopping? 1  Preparing Food and eating ? Y  Using the Toilet? N  In the past six months, have you accidently leaked urine? Y  Comment wears diapers at times  Do you have problems with loss of bowel control? N  Managing your Medications? N  Managing your Finances? N  Housekeeping or managing your Housekeeping? Y    Patient Care Team: Sherlene Shams, MD as PCP - General (Internal Medicine) Iran Ouch, MD as PCP - Cardiology (Cardiology) Salena Saner, MD as Consulting Physician (Pulmonary Disease)  Indicate any recent Medical  Services you may have received from other than [facility] providers in the past year (date may be approximate).     Assessment:   This is a routine wellness examination for Carisha.  Hearing/Vision screen Hearing Screening - Comments:: Wears aids Vision Screening - Comments:: readers   Goals Addressed             This Visit's Progress    Patient Stated       Wants to lose weight and exercise       Depression Screen    02/23/2023   10:48 AM 01/29/2023   11:51 AM 01/22/2023   11:42 AM 08/19/2022   10:08 AM 03/04/2022   11:52 AM 02/19/2022    3:49 PM 08/28/2021    9:55 AM  PHQ 2/9 Scores  PHQ - 2 Score 1 0 0 0 0 0 0  PHQ- 9 Score 4 2 2         Fall Risk    02/23/2023   10:44 AM 01/29/2023   11:51 AM 08/19/2022   10:08 AM 03/04/2022   11:52 AM 02/19/2022    3:51 PM  Fall Risk   Falls in the past year? 0 0 0 0 0  Number falls in past yr: 0 0 0  0  Injury with Fall? 0 0 0  0  Risk for fall due to : No Fall Risks No Fall Risks No Fall Risks Impaired balance/gait Impaired balance/gait  Risk for fall due to: Comment     Cane/walker in use  Follow up Falls prevention discussed;Falls evaluation completed Falls evaluation completed Falls  evaluation completed Falls evaluation completed Falls evaluation completed;Falls prevention discussed;Education provided    MEDICARE RISK AT HOME: Medicare Risk at Home Any stairs in or around the home?: No If so, are there any without handrails?: Yes Home free of loose throw rugs in walkways, pet beds, electrical cords, etc?: Yes Adequate lighting in your home to reduce risk of falls?: Yes Life alert?: Yes Use of a cane, walker or w/c?: Yes Grab bars in the bathroom?: Yes Shower chair or bench in shower?: Yes Elevated toilet seat or a handicapped toilet?: No   Cognitive Function:    02/10/2017   11:43 AM 02/11/2016   10:30 AM  MMSE - Mini Mental State Exam  Orientation to time 5 5  Orientation to Place 5 5  Registration 3 3   Attention/ Calculation 5 5  Recall 3 3  Language- name 2 objects 2 2  Language- repeat 1 1  Language- follow 3 step command 3 3  Language- read & follow direction 1 1  Write a sentence 1 1  Copy design 1 1  Total score 30 30        02/23/2023   10:54 AM 02/19/2022    3:52 PM 02/18/2021   12:47 PM 02/16/2020   12:38 PM 02/15/2019   12:19 PM  6CIT Screen  What Year? 0 points 0 points 0 points 0 points 0 points  What month? 0 points 0 points 0 points 0 points 0 points  What time? 0 points 0 points 0 points 0 points 0 points  Count back from 20 0 points 0 points 2 points 0 points 0 points  Months in reverse 0 points 0 points 0 points 0 points 0 points  Repeat phrase 0 points 0 points 0 points 0 points 0 points  Total Score 0 points 0 points 2 points 0 points 0 points    Immunizations Immunization History  Administered Date(s) Administered   Fluad Quad(high Dose 65+) 12/20/2020   Influenza Split 01/02/2012   Influenza,inj,Quad PF,6+ Mos 03/13/2014, 01/08/2015, 02/03/2018, 01/26/2019, 02/02/2020   Influenza-Unspecified 01/11/2013, 12/30/2014   PFIZER(Purple Top)SARS-COV-2 Vaccination 08/12/2019, 09/07/2019   Pneumococcal Conjugate-13 02/27/2015   Pneumococcal Polysaccharide-23 03/07/2013, 04/13/2019   Tdap 11/12/2009    TDAP status: Up to date  Flu Vaccine status: Due, Education has been provided regarding the importance of this vaccine. Advised may receive this vaccine at local pharmacy or Health Dept. Aware to provide a copy of the vaccination record if obtained from local pharmacy or Health Dept. Verbalized acceptance and understanding.  Pneumococcal vaccine status: Up to date  Covid-19 vaccine status: Declined, Education has been provided regarding the importance of this vaccine but patient still declined. Advised may receive this vaccine at local pharmacy or Health Dept.or vaccine clinic. Aware to provide a copy of the vaccination record if obtained from local pharmacy  or Health Dept. Verbalized acceptance and understanding.  Qualifies for Shingles Vaccine? Yes   Zostavax completed No   Shingrix Completed?: No.    Education has been provided regarding the importance of this vaccine. Patient has been advised to call insurance company to determine out of pocket expense if they have not yet received this vaccine. Advised may also receive vaccine at local pharmacy or Health Dept. Verbalized acceptance and understanding.  Screening Tests Health Maintenance  Topic Date Due   Zoster Vaccines- Shingrix (1 of 2) Never done   COVID-19 Vaccine (3 - Pfizer risk series) 10/05/2019   DTaP/Tdap/Td (2 - Td or Tdap) 11/13/2019  Fecal DNA (Cologuard)  01/26/2023   Medicare Annual Wellness (AWV)  02/20/2023   INFLUENZA VACCINE  06/29/2023 (Originally 10/30/2022)   Pneumonia Vaccine 13+ Years old (3 of 3 - PPSV23 or PCV20) 04/12/2024   MAMMOGRAM  06/11/2024   DEXA SCAN  Completed   Hepatitis C Screening  Completed   HPV VACCINES  Aged Out    Health Maintenance  Health Maintenance Due  Topic Date Due   Zoster Vaccines- Shingrix (1 of 2) Never done   COVID-19 Vaccine (3 - Pfizer risk series) 10/05/2019   DTaP/Tdap/Td (2 - Td or Tdap) 11/13/2019   Fecal DNA (Cologuard)  01/26/2023   Medicare Annual Wellness (AWV)  02/20/2023    Colorectal cancer screening: Type of screening: Cologuard. Completed patient stated that she mailed it in last week. Repeat every 3 years  Mammogram status: Completed 05/2022. Repeat every year  Bone Density status: Completed 10/2021. Results reflect: Bone density results: OSTEOPENIA. Repeat every 2 years.  Lung Cancer Screening: (Low Dose CT Chest recommended if Age 63-80 years, 20 pack-year currently smoking OR have quit w/in 15years.) does not qualify.     Additional Screening:  Hepatitis C Screening: does qualify; Completed 03/2021  Vision Screening: Recommended annual ophthalmology exams for early detection of glaucoma and other  disorders of the eye. Is the patient up to date with their annual eye exam?  Yes  Who is the provider or what is the name of the office in which the patient attends annual eye exams? [facility] If pt is not established with a provider, would they like to be referred to a provider to establish care? No .   Dental Screening: Recommended annual dental exams for proper oral hygiene    Community Resource Referral / Chronic Care Management: CRR required this visit?  No   CCM required this visit?  No     Plan:     I have personally reviewed and noted the following in the patient's chart:   Medical and social history Use of alcohol, tobacco or illicit drugs  Current medications and supplements including opioid prescriptions. Patient is currently taking opioid prescriptions. Information provided to patient regarding non-opioid alternatives. Patient advised to discuss non-opioid treatment plan with their provider. Functional ability and status Nutritional status Physical activity Advanced directives List of other physicians Hospitalizations, surgeries, and ER visits in previous 12 months Vitals Screenings to include cognitive, depression, and falls Referrals and appointments  In addition, I have reviewed and discussed with patient certain preventive protocols, quality metrics, and best practice recommendations. A written personalized care plan for preventive services as well as general preventive health recommendations were provided to patient.     Sydell Axon, LPN   16/12/9602   After Visit Summary: (Pick Up) Due to this being a telephonic visit, with patients personalized plan was offered to patient and patient has requested to Pick up at office.  Nurse Notes: None     I have reviewed the above information and agree with above.   Duncan Dull, MD

## 2023-03-05 ENCOUNTER — Other Ambulatory Visit: Payer: Self-pay

## 2023-03-05 ENCOUNTER — Other Ambulatory Visit: Payer: Self-pay | Admitting: Internal Medicine

## 2023-03-05 DIAGNOSIS — Z1211 Encounter for screening for malignant neoplasm of colon: Secondary | ICD-10-CM

## 2023-03-05 DIAGNOSIS — E034 Atrophy of thyroid (acquired): Secondary | ICD-10-CM

## 2023-03-05 LAB — COLOGUARD

## 2023-03-08 DIAGNOSIS — J449 Chronic obstructive pulmonary disease, unspecified: Secondary | ICD-10-CM | POA: Diagnosis not present

## 2023-03-10 ENCOUNTER — Other Ambulatory Visit: Payer: 59

## 2023-03-11 ENCOUNTER — Ambulatory Visit: Payer: 59 | Admitting: Pulmonary Disease

## 2023-03-16 DIAGNOSIS — Z1211 Encounter for screening for malignant neoplasm of colon: Secondary | ICD-10-CM | POA: Diagnosis not present

## 2023-03-17 ENCOUNTER — Other Ambulatory Visit (INDEPENDENT_AMBULATORY_CARE_PROVIDER_SITE_OTHER): Payer: 59

## 2023-03-17 DIAGNOSIS — E034 Atrophy of thyroid (acquired): Secondary | ICD-10-CM

## 2023-03-17 LAB — TSH: TSH: 2 u[IU]/mL (ref 0.35–5.50)

## 2023-03-21 LAB — COLOGUARD: COLOGUARD: NEGATIVE

## 2023-04-02 ENCOUNTER — Ambulatory Visit: Payer: Self-pay | Admitting: Internal Medicine

## 2023-04-02 ENCOUNTER — Telehealth: Payer: Self-pay | Admitting: Internal Medicine

## 2023-04-02 ENCOUNTER — Other Ambulatory Visit: Payer: Self-pay | Admitting: Internal Medicine

## 2023-04-02 MED ORDER — LOSARTAN POTASSIUM 50 MG PO TABS: 50.0000 mg | ORAL_TABLET | Freq: Every day | ORAL | 2 refills | Status: DC

## 2023-04-02 NOTE — Telephone Encounter (Addendum)
 Answer Assessment - Initial Assessment Questions 1. BLOOD PRESSURE: What is the blood pressure? Did you take at least two measurements 5 minutes apart?     129/89  2. ONSET: When did you take your blood pressure?     This morning  3. HOW: How did you take your blood pressure? (e.g., automatic home BP monitor, visiting nurse)     Home BP cuff  4. MEDICINES: Are you taking any medicines for blood pressure? Have you missed any doses recently?     Losartan  50 mg  5. OTHER SYMPTOMS: Do you have any symptoms? (e.g., blurred vision, chest pain, difficulty breathing, headache, weakness)     No  Protocols used: Blood Pressure - High-A-AH   Chief Complaint: Elevated BP Symptoms: None Pertinent Negatives: Patient denies headaches Disposition: [] ED /[] Urgent Care (no appt availability in office) / [] Appointment(In office/virtual)/ []  [place]/ [x] Home Care/ [] Refused Recommended Disposition /[]  Mobile Bus/ [x]  Follow-up with PCP  Additional Notes: Patient stated she lost her Losartan  medication, and the pharmacy would not refill it because it is too soon. Patient is requesting new prescription to be sent. Refill request has already been sent to the office per call center agent.  Call was transferred to this RN because patient mentioned a possible reaction to not taking her BP medication. During assessment, patient reported that she feels fine today. She was concerned because she had an elevated BP reading yesterday. She does not remember what the BP reading was. Patient does not remember the last time she took the medication, but she mentioned that she thinks she has been without medication for 1 week. I asked her to check her BP while on the phone and the reading was 129/89. Patient denies symptoms at this time. Patient is aware that a message is being sent and the office will follow up with her.

## 2023-04-02 NOTE — Telephone Encounter (Signed)
 Spoke with pt to let her know that the losartan has been sent to [facility] in [place] and to follow up on how pt is feeling. Pt stated that she feels fine.

## 2023-04-02 NOTE — Telephone Encounter (Signed)
 Copied from CRM [id number]. Topic: Clinical - Red Word Triage >> Apr 02, 2023  8:45 AM Lauren Bartlett D wrote: Red Word that prompted transfer to Nurse Triage: allergic reaction

## 2023-04-02 NOTE — Telephone Encounter (Signed)
 Copied from CRM [id number]. Topic: Clinical - Medication Refill >> Apr 02, 2023  8:39 AM Rolin D wrote: Most Recent Primary Care Visit:  Provider: [facility]  Department: [facility]  Visit Type: LAB  Date: 03/17/2023  Medication: losartan  (COZAAR ) 50 MG tablet  Has the patient contacted their pharmacy? Yes (Agent: If no, request that the patient contact the pharmacy for the refill. If patient does not wish to contact the pharmacy document the reason why and proceed with request.) (Agent: If yes, when and what did the pharmacy advise?)  Patient stated she lost her medication and needed it refilled but pharmacy stated that an authorization would need to be done since it was just refilled.   Is this the correct pharmacy for this prescription? Yes If no, delete pharmacy and type the correct one.  This is the patient's preferred pharmacy:  [id number] [place] - [address] AT [facility] OF [address] [place] [address] Phone: [phone] Fax: [phone]     Has the prescription been filled recently? Yes  Is the patient out of the medication? Yes  Has the patient been seen for an appointment in the last year OR does the patient have an upcoming appointment? Yes  Can we respond through MyChart? No  Agent: Please be advised that Rx refills may take up to 3 business days. We ask that you follow-up with your pharmacy.

## 2023-04-08 ENCOUNTER — Other Ambulatory Visit: Payer: Self-pay | Admitting: Pulmonary Disease

## 2023-04-08 DIAGNOSIS — J449 Chronic obstructive pulmonary disease, unspecified: Secondary | ICD-10-CM | POA: Diagnosis not present

## 2023-04-09 ENCOUNTER — Ambulatory Visit: Payer: 59 | Admitting: Pulmonary Disease

## 2023-04-17 ENCOUNTER — Other Ambulatory Visit: Payer: Self-pay

## 2023-04-17 NOTE — Telephone Encounter (Signed)
E-Prescribing Status: Receipt confirmed by pharmacy (03/04/2022 12:09 PM EST)

## 2023-05-05 ENCOUNTER — Other Ambulatory Visit: Payer: Self-pay

## 2023-05-05 MED ORDER — ALBUTEROL SULFATE HFA 108 (90 BASE) MCG/ACT IN AERS: INHALATION_SPRAY | RESPIRATORY_TRACT | 6 refills | Status: DC

## 2023-05-06 ENCOUNTER — Other Ambulatory Visit: Payer: Self-pay | Admitting: Internal Medicine

## 2023-05-06 DIAGNOSIS — Z1231 Encounter for screening mammogram for malignant neoplasm of breast: Secondary | ICD-10-CM

## 2023-05-07 ENCOUNTER — Ambulatory Visit: Payer: 59 | Admitting: Pulmonary Disease

## 2023-05-07 ENCOUNTER — Encounter: Payer: Self-pay | Admitting: Pulmonary Disease

## 2023-05-07 VITALS — BP 122/80 | HR 86 | Temp 96.9°F | Ht 62.0 in | Wt 254.0 lb

## 2023-05-07 DIAGNOSIS — I2721 Secondary pulmonary arterial hypertension: Secondary | ICD-10-CM

## 2023-05-07 DIAGNOSIS — I5032 Chronic diastolic (congestive) heart failure: Secondary | ICD-10-CM

## 2023-05-07 DIAGNOSIS — I2729 Other secondary pulmonary hypertension: Secondary | ICD-10-CM

## 2023-05-07 DIAGNOSIS — J398 Other specified diseases of upper respiratory tract: Secondary | ICD-10-CM

## 2023-05-07 DIAGNOSIS — J986 Disorders of diaphragm: Secondary | ICD-10-CM | POA: Diagnosis not present

## 2023-05-07 DIAGNOSIS — G4736 Sleep related hypoventilation in conditions classified elsewhere: Secondary | ICD-10-CM | POA: Diagnosis not present

## 2023-05-07 DIAGNOSIS — J4489 Other specified chronic obstructive pulmonary disease: Secondary | ICD-10-CM

## 2023-05-07 DIAGNOSIS — K766 Portal hypertension: Secondary | ICD-10-CM | POA: Diagnosis not present

## 2023-05-07 DIAGNOSIS — J209 Acute bronchitis, unspecified: Secondary | ICD-10-CM

## 2023-05-07 DIAGNOSIS — Z6841 Body Mass Index (BMI) 40.0 and over, adult: Secondary | ICD-10-CM

## 2023-05-07 MED ORDER — ALBUTEROL SULFATE HFA 108 (90 BASE) MCG/ACT IN AERS: INHALATION_SPRAY | RESPIRATORY_TRACT | 6 refills | Status: DC

## 2023-05-07 MED ORDER — DOXYCYCLINE HYCLATE 100 MG PO TABS: 100.0000 mg | ORAL_TABLET | Freq: Two times a day (BID) | ORAL | 0 refills | Status: DC

## 2023-05-07 NOTE — Patient Instructions (Signed)
 VISIT SUMMARY:  Today, we discussed your ongoing respiratory issues, including chronic asthmatic bronchitis and acute bronchitis. We also reviewed your current medications and made some adjustments to help manage your symptoms more effectively.  YOUR PLAN:  -CHRONIC ASTHMATIC BRONCHITIS: Chronic asthmatic bronchitis is a long-term inflammation of the airways that causes difficulty breathing. We will refill your albuterol  rescue inhaler and prescribe doxycycline  100 mg to be taken twice daily for 7 days. Please take the medication with food but avoid dairy products within an hour of taking it. Also, avoid sun exposure as the medication can make your skin more sensitive to sunlight. Continue using your nebulizer at least twice a day. Follow up in 2 months.  -ACUTE BRONCHITIS: Acute bronchitis is a sudden inflammation of the airways, often due to infection, causing symptoms like green sputum and chest tightness. We have prescribed doxycycline  100 mg twice daily for 7 days to address a possible bacterial infection. Use your albuterol  rescue inhaler as needed for chest tightness and continue using your nebulizer at least twice a day. Follow up in 2 months.  -TRACHEOMALACIA: Tracheomalacia is a condition where the windpipe is weak and can collapse, contributing to breathing difficulties. No new changes or complaints were discussed today.  -NOCTURNAL HYPOXEMIA DUE TO PULMONARY HYPERTENSION: Nocturnal hypoxemia is low oxygen  levels during sleep, often due to pulmonary hypertension. No new changes or complaints were discussed today.  INSTRUCTIONS:  Please follow up in 2 months to review your progress and make any necessary adjustments to your treatment plan.

## 2023-05-07 NOTE — Progress Notes (Signed)
 Subjective:    Patient ID: Lauren Bartlett, female    DOB: 1955-05-08, 68 y.o.   MRN: [id number]  Patient Care Team: Marylynn Verneita CROME, MD as PCP - General (Internal Medicine) Darron Deatrice LABOR, MD as PCP - Cardiology (Cardiology) Tamea Dedra CROME, MD as Consulting Physician (Pulmonary Disease)  Chief Complaint  Patient presents with   Follow-up    Cough with green sputum and wheezing for a couple of days. No SOB.    BACKGROUND/INTERVAL: Nathan is a 68 year old very complex lifelong never smoker with chronic asthmatic bronchitis and tracheobronchomalacia who follows for the issue of dyspnea.  This is a scheduled visit.  She was last seen on 05 Aug 2022 and at that time appeared well compensated.  HPI Discussed the use of AI scribe software for clinical note transcription with the patient, who gave verbal consent to proceed.  History of Present Illness   Lauren Bartlett is a 68 year old female with chronic asthmatic bronchitis, tracheomalacia, right hemidiaphragm paralysis and nocturnal hypoxemia due to pulmonary hypertension who presents for follow-up.  She has experienced a sensation of having a 'frog in her throat' for about a week, with no known exposure to sick individuals. This symptom is accompanied by the production of green sputum in the morning, which becomes clear as the day progresses.  She feels tightness in her chest, which she attributes to recent changes in the weather. No significant wheezing is noted.  She is currently using Trelegy, which she finds helpful in managing her symptoms. She requires a refill for her albuterol  rescue inhaler. She has a nebulizer at home and uses it a couple of times a day, although it makes her feel nervous.  She is prescribed doxycycline  to be taken twice a day for a week, with instructions to avoid dairy products close to the time of taking the medication.     DATA: Echocardiogram 02/01/13: Normal LVEF. MVP with moderate MR, Moderate AI,  severe TI. RVSP est 91 mmHg CT chest 07/29/15: Cardiomegaly with left atrial dilatation. Tracheobronchomalacia. Air trapping in the lungs bilaterally, indicative of small airways disease Echocardiogram 06/13/14: Normal LVEF, Severe MR, RVSP est 62 mmHg Spirometry 03/12/15: FVC 1.94 L (73% pred). FEV1 1.19 L (55% pred), FEV1/FVC 61% 03/12/15: 225 meters, no desaturation LE venous US  07/29/15: No DVT Echocardiogram 07/31/15: LVEF 60-65%. MV poorly visualized, trivial MR, LA size normal, TV poorly visualized, RVSP not estimated CT chest 03/11/16: Stable changes of tracheobronchomalacia Echocardiogram 04/15/16: Normal LVEF, Severe MR, moderate PAH Mitral valvuloplasty 07/17/16: @ [facility] by Dr Dusty Echocardiogram 09/15/16: LVEF 60-65%. RVSP estimate 54 mmHg ONO 10/21/16: on RA. 45 mins < 89%, lowest SpO2 84% PFTs 03/26/17: FVC: 1.73 > 1.74 L (63 %pred), FEV1: 1.01 > 1.16 L (45> 52%pred, 15% improvement after bronchodilator), FEV1/FVC: 59 >67%, TLC: 3.27 L (73 %pred), DLCO 46 %pred, DLCO/VA 103% 07/24/2020 echocardiogram: LVEF 55 to 60%, mildly elevated pulmonary artery systolic pressure mildly dilated left atrium, prosthetic annuloplasty ring in the mitral position.    Review of Systems A 10 point review of systems was performed and it is as noted above otherwise negative.   Patient Active Problem List   Diagnosis Date Noted   Myalgia due to statin 08/19/2022   Osteopenia determined by x-ray 08/28/2021   Acute bronchitis with COPD (HCC) 07/11/2021   Unsteady gait when walking 04/29/2021   Aortic atherosclerosis (HCC) 09/16/2020   Recurrent epistaxis 06/16/2020   PAF (paroxysmal atrial fibrillation) (HCC) 12/18/2018   Nocturnal  hypoxemia due to pulmonary hypertension (HCC) 02/06/2018   Encounter for therapeutic drug monitoring 08/13/2016   S/P minimally invasive mitral valve repair 07/17/2016   S/P patent foramen ovale closure 07/17/2016   Chronic diastolic congestive heart failure (HCC)     COPD (chronic obstructive pulmonary disease) (HCC) 05/19/2016   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   Vitamin D  deficiency 01/11/2015   Allergic rhinitis 07/09/2014   S/P hysterectomy with oophorectomy 09/22/2013   Pulmonary hypertension (HCC)    Hypocalcemia 09/10/2012   Hypothyroidism 05/04/2012   Morbid obesity (HCC) 08/04/2011   Hypertension    Congenital absence of one kidney    History of sciatica     Social History   Tobacco Use   Smoking status: Never   Smokeless tobacco: Never  Substance Use Topics   Alcohol  use: No    Allergies  Allergen Reactions   Codeine Hives and Nausea And Vomiting   Levaquin  [Levofloxacin ] Nausea And Vomiting   Penicillins Hives, Itching and Other (See Comments)    Has patient had a PCN reaction causing immediate rash, facial/tongue/throat swelling, SOB or lightheadedness with hypotension: No Has patient had a PCN reaction causing severe rash involving mucus membranes or skin necrosis: No Has patient had a PCN reaction that required hospitalization No Has patient had a PCN reaction occurring within the last 10 years: No If all of the above answers are NO, then may proceed with Cephalosporin use.   Zithromax  [Azithromycin ] Other (See Comments)    Reaction:  Hallucinations    Prednisone  Palpitations and Other (See Comments)    Reaction:  Hallucinations  Can take injections but not oral meds    No outpatient medications have been marked as taking for the 05/07/23 encounter (Office Visit) with Tamea Dedra CROME, MD.    Immunization History  Administered Date(s) Administered   Fluad Quad(high Dose 65+) 12/20/2020   Influenza Split 01/02/2012   Influenza,inj,Quad PF,6+ Mos 03/13/2014, 01/08/2015, 02/03/2018, 01/26/2019, 02/02/2020   Influenza-Unspecified 01/11/2013, 12/30/2014   PFIZER(Purple Top)SARS-COV-2 Vaccination 08/12/2019, 09/07/2019   Pneumococcal Conjugate-13 02/27/2015   Pneumococcal Polysaccharide-23 03/07/2013, 04/13/2019   Tdap  11/12/2009, 05/23/2021        Objective:     BP 122/80 (BP Location: Left Arm, Cuff Size: Large)   Pulse 86   Temp (!) 96.9 F (36.1 C)   Ht 5' 2 (1.575 m)   Wt 254 lb (115.2 kg) Comment: per patient. in a wheelchair today  SpO2 95%   BMI 46.46 kg/m   SpO2: 95 % O2 Device: None (Room air)  GENERAL: Obese woman, no acute distress, presents in transport chair.  No conversational dyspnea.  Looks older than stated age. HEAD: Normocephalic, atraumatic.  EYES: Pupils equal, round, reactive to light.  No scleral icterus.  MOUTH: Oral mucosa moist.  No thrush. NECK: Supple. No thyromegaly. Trachea midline. No JVD.  No adenopathy. PULMONARY: Good air entry bilaterally.  Slightly diminished breath sounds on the right base (elevated right hemidiaphragm).  No adventitious sounds noted today. CARDIOVASCULAR: S1 and S2. Regular rate and rhythm.  Accentuated P2. ABDOMEN: Obese otherwise benign. MUSCULOSKELETAL: No joint deformity, no clubbing, no edema.  NEUROLOGIC: No overt focal deficit, gait not tested patient presents in transport chair. SKIN: Intact,warm,dry. PSYCH: Flat affect.  Assessment & Plan:     ICD-10-CM   1. Chronic asthmatic bronchitis (HCC)  J44.89     2. Acute bronchitis, unspecified organism  J20.9     3. Tracheomalacia  J39.8     4. Nocturnal hypoxemia due to pulmonary  hypertension (HCC)  I27.29    G47.36     5. PAH (pulmonary arterial hypertension) with portal hypertension (HCC)  I27.21    K76.6     6. Elevated hemidiaphragm - RIGHT  J98.6     7. Chronic diastolic congestive heart failure (HCC)  I50.32     8. Morbid obesity with BMI of 45.0-49.9, adult (HCC)  E66.01    Z68.42      Meds ordered this encounter  Medications   doxycycline  (VIBRA -TABS) 100 MG tablet    Sig: Take 1 tablet (100 mg total) by mouth 2 (two) times daily.    Dispense:  14 tablet    Refill:  0   albuterol  (VENTOLIN  HFA) 108 (90 Base) MCG/ACT inhaler    Sig: INHALE 2  INHALATIONS BY  MOUTH INTO THE LUNGS EVERY  6 HOURS AS NEEDED FOR  WHEEZING OR SHORTNESS OF  BREATH (USE WITH SPACER)    Dispense:  18 g    Refill:  6    Requesting 1 year supply   Discussion:    Chronic Asthmatic Bronchitis Vicky presents with a week-long history of morning green sputum production, which clears later in the day. She is currently using Trelegy effectively. No significant wheezing noted. - Refill albuterol  rescue inhaler - Prescribe doxycycline  100 mg twice daily for 7 days - Advise to take doxycycline  with food but avoid dairy products within an hour of taking the medication - Advise to avoid sun exposure due to photosensitivity risk with doxycycline  - Advise to use nebulizer at least twice a day - Follow up in 2 months or as needed  Acute Bronchitis Symptoms of green sputum production and chest tightness suggest acute bronchitis. Doxycycline  prescribed due to possible bacterial infection. Steroid treatment deemed unnecessary due to lack of significant wheezing/bronchospasm. - Prescribe doxycycline  100 mg twice daily for 7 days - Advise to use albuterol  rescue inhaler as needed for tightness - Advise to use nebulizer at least twice a day - Follow up in 2 months  Tracheomalacia/Diaphragm Paralysis These issues contribute to her respiratory symptoms. No new complaints or changes discussed.  Nocturnal Hypoxemia due to Pulmonary Hypertension Nocturnal hypoxemia secondary to pulmonary hypertension.  Patient compliant with oxygen  therapy, notes benefit of the same.  Obesity Hypoventilation Syndrome Extreme obesity with associated hypoventilation. No new complaints or changes discussed.  - Weight loss recommended  Advised if symptoms do not improve or worsen, to please contact office for sooner follow up or seek emergency care.    I spent 32 minutes of dedicated to the care of this patient on the date of this encounter to include pre-visit review of records, face-to-face  time with the patient discussing conditions above, post visit ordering of testing, clinical documentation with the electronic health record, making appropriate referrals as documented, and communicating necessary findings to members of the patients care team.     C. Leita Sanders, MD Advanced Bronchoscopy P[facility]    *This note was generated using voice recognition software/Dragon and/or AI transcription program.  Despite best efforts to proofread, errors can occur which can change the meaning. Any transcriptional errors that result from this process are unintentional and may not be fully corrected at the time of dictation.

## 2023-05-09 DIAGNOSIS — J449 Chronic obstructive pulmonary disease, unspecified: Secondary | ICD-10-CM | POA: Diagnosis not present

## 2023-05-15 ENCOUNTER — Encounter: Payer: Self-pay | Admitting: Pulmonary Disease

## 2023-06-02 ENCOUNTER — Ambulatory Visit: Payer: 59 | Admitting: Internal Medicine

## 2023-06-06 DIAGNOSIS — J449 Chronic obstructive pulmonary disease, unspecified: Secondary | ICD-10-CM | POA: Diagnosis not present

## 2023-06-08 ENCOUNTER — Other Ambulatory Visit: Payer: Self-pay | Admitting: Internal Medicine

## 2023-06-16 ENCOUNTER — Ambulatory Visit: Payer: 59

## 2023-06-18 ENCOUNTER — Ambulatory Visit
Admission: RE | Admit: 2023-06-18 | Discharge: 2023-06-18 | Disposition: A | Discharge status: Home / Self Care | Source: Ambulatory Visit | Attending: Internal Medicine | Admitting: Internal Medicine

## 2023-06-18 DIAGNOSIS — Z1231 Encounter for screening mammogram for malignant neoplasm of breast: Secondary | ICD-10-CM | POA: Insufficient documentation

## 2023-07-06 DIAGNOSIS — H905 Unspecified sensorineural hearing loss: Secondary | ICD-10-CM | POA: Diagnosis not present

## 2023-07-07 DIAGNOSIS — J449 Chronic obstructive pulmonary disease, unspecified: Secondary | ICD-10-CM | POA: Diagnosis not present

## 2023-07-09 ENCOUNTER — Ambulatory Visit: Payer: 59 | Admitting: Pulmonary Disease

## 2023-07-10 ENCOUNTER — Other Ambulatory Visit: Payer: Self-pay | Admitting: Pulmonary Disease

## 2023-07-13 ENCOUNTER — Ambulatory Visit: Payer: 59 | Admitting: Internal Medicine

## 2023-07-31 DIAGNOSIS — E559 Vitamin D deficiency, unspecified: Secondary | ICD-10-CM | POA: Diagnosis not present

## 2023-08-06 DIAGNOSIS — J449 Chronic obstructive pulmonary disease, unspecified: Secondary | ICD-10-CM | POA: Diagnosis not present

## 2023-08-14 ENCOUNTER — Encounter: Payer: Self-pay | Admitting: Pulmonary Disease

## 2023-08-14 ENCOUNTER — Ambulatory Visit: Admitting: Pulmonary Disease

## 2023-08-14 VITALS — BP 132/86 | HR 84 | Ht 62.0 in | Wt 243.0 lb

## 2023-08-14 DIAGNOSIS — G4736 Sleep related hypoventilation in conditions classified elsewhere: Secondary | ICD-10-CM

## 2023-08-14 DIAGNOSIS — J4489 Other specified chronic obstructive pulmonary disease: Secondary | ICD-10-CM | POA: Diagnosis not present

## 2023-08-14 DIAGNOSIS — K766 Portal hypertension: Secondary | ICD-10-CM

## 2023-08-14 DIAGNOSIS — Z6841 Body Mass Index (BMI) 40.0 and over, adult: Secondary | ICD-10-CM

## 2023-08-14 DIAGNOSIS — I2729 Other secondary pulmonary hypertension: Secondary | ICD-10-CM

## 2023-08-14 DIAGNOSIS — J301 Allergic rhinitis due to pollen: Secondary | ICD-10-CM | POA: Diagnosis not present

## 2023-08-14 DIAGNOSIS — J398 Other specified diseases of upper respiratory tract: Secondary | ICD-10-CM

## 2023-08-14 DIAGNOSIS — Z9889 Other specified postprocedural states: Secondary | ICD-10-CM

## 2023-08-14 DIAGNOSIS — J986 Disorders of diaphragm: Secondary | ICD-10-CM | POA: Diagnosis not present

## 2023-08-14 DIAGNOSIS — I2721 Secondary pulmonary arterial hypertension: Secondary | ICD-10-CM

## 2023-08-14 DIAGNOSIS — I5032 Chronic diastolic (congestive) heart failure: Secondary | ICD-10-CM

## 2023-08-14 NOTE — Patient Instructions (Signed)
 VISIT SUMMARY:  You came in for a follow-up visit to manage your chronic asthmatic bronchitis, chronic elevation of the right hemidiaphragm, and pulmonary artery hypertension. You mentioned feeling better than during your last visit and are using a cane for mobility. You continue to use Trelegy and your rescue inhaler, and you are also using oxygen  at night. Weather changes, especially hot weather, make your symptoms worse. You are also trying to lose weight.  YOUR PLAN:  -CHRONIC ASTHMATIC BRONCHITIS: Chronic asthmatic bronchitis is a long-term inflammation of the airways in the lungs. You are managing it well with your current treatment. Continue using Trelegy as prescribed and your rescue inhaler as needed, which is currently once daily. Your lung sounds are clear on examination, and you should continue using nighttime oxygen  therapy.  -PULMONARY HYPERTENSION WITH NOCTURNAL HYPOXEMIA: Pulmonary hypertension is high blood pressure in the lungs' arteries, and nocturnal hypoxemia means low oxygen  levels at night. This condition is well-managed with your nighttime oxygen  therapy, so you should continue this treatment.  INSTRUCTIONS:  Please continue with your current medications and nighttime oxygen  therapy. If you experience any worsening of symptoms or have any concerns, schedule a follow-up appointment.  Otherwise we will see you in follow-up in 6 months time please call sooner should any new problems arise.

## 2023-08-14 NOTE — Progress Notes (Signed)
 Subjective:    Patient ID: Lauren Bartlett, female    DOB: [birth year], 68 y.o.   MRN: [id number]  Patient Care Team: Thersia Flax, MD as PCP - General (Internal Medicine) Wenona Hamilton, MD as PCP - Cardiology (Cardiology) Marc Senior, MD as Consulting Physician (Pulmonary Disease)  Chief Complaint  Patient presents with   Chronic asthmatic bronchitis    BACKGROUND/INTERVAL: Lauren Bartlett is a 68 year old very complex lifelong never smoker with chronic asthmatic bronchitis and tracheobronchomalacia who follows for the issue of dyspnea.  This is a scheduled visit.  She was last seen on 07 May 2023 and at that time had a mild episode of bronchitis with doxycycline .   HPI Discussed the use of AI scribe software for clinical note transcription with the patient, who gave verbal consent to proceed.  History of Present Illness   Lauren Bartlett is a 68 year old female with chronic asthmatic bronchitis, chronic elevation of the right hemidiaphragm, tracheomalacia and pulmonary artery hypertension who presents for follow-up.  She has a very complex medical history as noted below.  She feels better than during her last visit and uses a cane for mobility. She continues to use Trelegy, which she believes helps, especially after the increase in strength. She uses her rescue inhaler once a day, primarily in the morning due to allergies.   She continues to use oxygen  at night.  She notes benefit of the therapy.  Weather changes, particularly when it is very hot, exacerbate her symptoms.  She is trying to lose weight.  She was encouraged to discuss this with her primary care physician.     DATA: Echocardiogram 02/01/13: Normal LVEF. MVP with moderate MR, Moderate AI, severe TI. RVSP est 91 mmHg CT chest 07/29/15: Cardiomegaly with left atrial dilatation. Tracheobronchomalacia. Air trapping in the lungs bilaterally, indicative of small airways disease Echocardiogram 06/13/14: Normal LVEF, Severe  MR, RVSP est 62 mmHg Spirometry 03/12/15: FVC 1.94 L (73% pred). FEV1 1.19 L (55% pred), FEV1/FVC 61% 03/12/15: 225 meters, no desaturation LE venous US  07/29/15: No DVT Echocardiogram 07/31/15: LVEF 60-65%. MV poorly visualized, trivial MR, LA size normal, TV poorly visualized, RVSP not estimated CT chest 03/11/16: Stable changes of tracheobronchomalacia Echocardiogram 04/15/16: Normal LVEF, Severe MR, moderate PAH Mitral valvuloplasty 07/17/16: @ [facility] by Dr Alva Jewels Echocardiogram 09/15/16: LVEF 60-65%. RVSP estimate 54 mmHg ONO 10/21/16: on RA. 45 mins < 89%, lowest SpO2 84% PFTs 03/26/17: FVC: 1.73 > 1.74 L (63 %pred), FEV1: 1.01 > 1.16 L (45> 52%pred, 15% improvement after bronchodilator), FEV1/FVC: 59 >67%, TLC: 3.27 L (73 %pred), DLCO 46 %pred, DLCO/VA 103% 07/24/2020 echocardiogram: LVEF 55 to 60%, mildly elevated pulmonary artery systolic pressure mildly dilated left atrium, prosthetic annuloplasty ring in the mitral position. 08/07/2022 echocardiogram: LVEF 60 to 65%, normal LV function, no regional wall motion abnormalities.  Moderate left ventricular hypertrophy.  Indeterminate diastolic parameters.  Normal right ventricular systolic function.  Mildly elevated pulmonary artery systolic pressure.  RV systolic pressure is 41.5 mmHg, left atrial size mildly dilated.  Mitral valve has been repaired/replaced, mild mitral stenosis.  Prosthetic annuloplasty ring present in the mitral position (06/2016).  Moderate tricuspid valve regurgitation.  No aortic stenosis.    Review of Systems A 10 point review of systems was performed and it is as noted above otherwise negative.   Patient Active Problem List   Diagnosis Date Noted   Myalgia due to statin 08/19/2022   Osteopenia determined by x-ray 08/28/2021   Acute  bronchitis with COPD (HCC) 07/11/2021   Unsteady gait when walking 04/29/2021   Aortic atherosclerosis (HCC) 09/16/2020   Recurrent epistaxis 06/16/2020   PAF (paroxysmal atrial  fibrillation) (HCC) 12/18/2018   Nocturnal hypoxemia due to pulmonary hypertension (HCC) 02/06/2018   Encounter for therapeutic drug monitoring 08/13/2016   S/P minimally invasive mitral valve repair 07/17/2016   S/P patent foramen ovale closure 07/17/2016   Chronic diastolic congestive heart failure (HCC)    COPD (chronic obstructive pulmonary disease) (HCC) 05/19/2016   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   Vitamin D  deficiency 01/11/2015   Allergic rhinitis 07/09/2014   S/P hysterectomy with oophorectomy 09/22/2013   Pulmonary hypertension (HCC)    Hypocalcemia 09/10/2012   Hypothyroidism 05/04/2012   Morbid obesity (HCC) 08/04/2011   Hypertension    Congenital absence of one kidney    History of sciatica     Social History   Tobacco Use   Smoking status: Never   Smokeless tobacco: Never  Substance Use Topics   Alcohol  use: No    Allergies  Allergen Reactions   Codeine Hives and Nausea And Vomiting   Levaquin  [Levofloxacin ] Nausea And Vomiting   Penicillins Hives, Itching and Other (See Comments)    Has patient had a PCN reaction causing immediate rash, facial/tongue/throat swelling, SOB or lightheadedness with hypotension: No Has patient had a PCN reaction causing severe rash involving mucus membranes or skin necrosis: No Has patient had a PCN reaction that required hospitalization No Has patient had a PCN reaction occurring within the last 10 years: No If all of the above answers are "NO", then may proceed with Cephalosporin use.   Zithromax  [Azithromycin ] Other (See Comments)    Reaction:  Hallucinations    Prednisone  Palpitations and Other (See Comments)    Reaction:  Hallucinations  Can take injections but not oral meds    Current Meds  Medication Sig   acetaminophen  (TYLENOL ) 500 MG tablet Take 500 mg by mouth 2 (two) times daily as needed for moderate pain or headache.   albuterol  (PROVENTIL ) (2.5 MG/3ML) 0.083% nebulizer solution INHALE THE CONTENTS OF 1 VIAL  VIA  NEBULIZER EVERY 6 HOURS AS  NEEDED FOR WHEEZING OR FOR  SHORTNESS OF BREATH   albuterol  (VENTOLIN  HFA) 108 (90 Base) MCG/ACT inhaler INHALE 2 INHALATIONS BY  MOUTH INTO THE LUNGS EVERY  6 HOURS AS NEEDED FOR  WHEEZING OR SHORTNESS OF  BREATH (USE WITH SPACER)   aspirin  EC 81 MG EC tablet Take 1 tablet (81 mg total) by mouth daily.   budesonide  (PULMICORT ) 0.25 MG/2ML nebulizer solution Take 2 mLs (0.25 mg total) by nebulization in the morning and at bedtime.   calcitRIOL  (ROCALTROL ) 0.25 MCG capsule TAKE 1 CAPSULE BY MOUTH DAILY   Calcium  Carb-Cholecalciferol 600-800 MG-UNIT TABS Take 1 tablet by mouth in the morning, at noon, and at bedtime.    docusate sodium  (COLACE) 100 MG capsule Take 100 mg by mouth daily.   doxycycline  (VIBRA -TABS) 100 MG tablet Take 1 tablet (100 mg total) by mouth 2 (two) times daily.   famotidine  (PEPCID  AC) 10 MG chewable tablet Chew 20 mg by mouth daily as needed for heartburn.    ferrous sulfate  325 (65 FE) MG tablet Take 325 mg by mouth daily with breakfast.   Fluticasone -Umeclidin-Vilant (TRELEGY ELLIPTA ) 200-62.5-25 MCG/ACT AEPB USE 1 INHALATION BY MOUTH DAILY   furosemide  (LASIX ) 20 MG tablet TAKE 1 TABLET BY MOUTH DAILY .  MAY TAKE AN ADDITIONAL TABLET AS NEEDED FOR WEIGHT GAIN OF 2 LBS  OVERNIGHT  OR 5 LBS IN 1 WEEK   hydrochlorothiazide  (HYDRODIURIL ) 25 MG tablet Take 25 mg by mouth.   levothyroxine  (SYNTHROID ) 125 MCG tablet Take 1 tablet (125 mcg total) by mouth daily.   lidocaine  (LIDODERM ) 5 % Place 1 patch onto the skin every 12 (twelve) hours. Remove & Discard patch within 12 hours or as directed by MD   loratadine  (CLARITIN ) 10 MG tablet Take 10 mg by mouth daily.   losartan  (COZAAR ) 50 MG tablet Take 1 tablet (50 mg total) by mouth daily.   metoprolol  tartrate (LOPRESSOR ) 25 MG tablet    OXYGEN  Inhale 2 L into the lungs at bedtime as needed (at bedtime and as needed).   oxymetazoline  (AFRIN) 0.05 % nasal spray Place 1 spray into both nostrils 2 (two)  times daily. As needed for nose bleed   polyethylene glycol powder (GLYCOLAX /MIRALAX ) powder Take 17 g by mouth 2 (two) times daily as needed.   traMADol  (ULTRAM ) 50 MG tablet Take 1-2 tablets (50-100 mg total) by mouth every 4 (four) hours as needed for moderate pain.   vitamin B-12 (CYANOCOBALAMIN ) 1000 MCG tablet Take 1 tablet (1,000 mcg total) by mouth daily.   Vitamin D , Ergocalciferol , (DRISDOL ) 1.25 MG (50000 UNIT) CAPS capsule TAKE 1 CAPSULE BY MOUTH 1 TIME A WEEK    Immunization History  Administered Date(s) Administered   Fluad Quad(high Dose 65+) 12/20/2020   Influenza Split 01/02/2012   Influenza,inj,Quad PF,6+ Mos 03/13/2014, 01/08/2015, 02/03/2018, 01/26/2019, 02/02/2020   Influenza-Unspecified 01/11/2013, 12/30/2014   PFIZER(Purple Top)SARS-COV-2 Vaccination 08/12/2019, 09/07/2019   Pneumococcal Conjugate-13 02/27/2015   Pneumococcal Polysaccharide-23 03/07/2013, 04/13/2019   Tdap 11/12/2009, 05/23/2021        Objective:   BP 132/86   Pulse 84   Ht 5\' 2"  (1.575 m)   Wt 243 lb (110.2 kg)   SpO2 94%   BMI 44.45 kg/m   SpO2: 94 %  GENERAL: Obese woman, no acute distress, presents in transport chair.  No conversational dyspnea.  Looks older than stated age. HEAD: Normocephalic, atraumatic.  EYES: Pupils equal, round, reactive to light.  No scleral icterus.  MOUTH: Oral mucosa moist.  No thrush. NECK: Supple. No thyromegaly. Trachea midline. No JVD.  No adenopathy. PULMONARY: Good air entry bilaterally.  Slightly diminished breath sounds on the right base (elevated right hemidiaphragm).  No adventitious sounds noted today. CARDIOVASCULAR: S1 and S2. Regular rate and rhythm.  Accentuated P2. ABDOMEN: Obese otherwise benign. MUSCULOSKELETAL: No joint deformity, no clubbing, no edema.  NEUROLOGIC: No overt focal deficit, gait not tested patient presents in transport chair. SKIN: Intact,warm,dry. PSYCH: Flat affect.    Assessment & Plan:     ICD-10-CM   1.  Chronic asthmatic bronchitis (HCC)  J44.89     2. Nocturnal hypoxemia due to pulmonary hypertension (HCC)  I27.29    G47.36     3. PAH (pulmonary arterial hypertension) with portal hypertension (HCC)  I27.21    K76.6     4. Seasonal allergic rhinitis due to pollen  J30.1     5. Elevated hemidiaphragm - RIGHT  J98.6     6. Tracheomalacia  J39.8     7. Chronic diastolic congestive heart failure (HCC)  I50.32     8. S/P minimally invasive mitral valve repair  Z98.890     9. Morbid obesity with BMI of 45.0-49.9, adult (HCC)  E66.01    Z68.42      Discussion:    Chronic asthmatic bronchitis Chronic asthmatic bronchitis is being managed with current  treatment. She reports effective use of Trelegy, especially after increasing the strength. She uses her rescue inhaler once daily, primarily in the morning due to allergies. Lung sounds are clear on examination. - Continue Trelegy as prescribed - Use rescue inhaler as needed, currently once daily - Continue nighttime oxygen  therapy  Pulmonary hypertension with nocturnal hypoxemia Pulmonary hypertension with nocturnal hypoxemia is well-managed with nighttime oxygen  therapy. - Continue nighttime oxygen  therapy     Advised if symptoms do not improve or worsen, to please contact office for sooner follow up or seek emergency care.    I spent 34 minutes of dedicated to the care of this patient on the date of this encounter to include pre-visit review of records, face-to-face time with the patient discussing conditions above, post visit ordering of testing, clinical documentation with the electronic health record, making appropriate referrals as documented, and communicating necessary findings to members of the patients care team.     C. Chloe Counter, MD [facility]    *This note was generated using voice recognition software/Dragon and/or AI transcription program.  Despite best efforts to  proofread, errors can occur which can change the meaning. Any transcriptional errors that result from this process are unintentional and may not be fully corrected at the time of dictation.

## 2023-08-20 ENCOUNTER — Encounter: Payer: Self-pay | Admitting: Internal Medicine

## 2023-08-20 ENCOUNTER — Ambulatory Visit (INDEPENDENT_AMBULATORY_CARE_PROVIDER_SITE_OTHER): Admitting: Internal Medicine

## 2023-08-20 DIAGNOSIS — R2681 Unsteadiness on feet: Secondary | ICD-10-CM

## 2023-08-20 DIAGNOSIS — M8580 Other specified disorders of bone density and structure, unspecified site: Secondary | ICD-10-CM

## 2023-08-20 DIAGNOSIS — I480 Paroxysmal atrial fibrillation: Secondary | ICD-10-CM

## 2023-08-20 DIAGNOSIS — G4736 Sleep related hypoventilation in conditions classified elsewhere: Secondary | ICD-10-CM | POA: Diagnosis not present

## 2023-08-20 DIAGNOSIS — E559 Vitamin D deficiency, unspecified: Secondary | ICD-10-CM

## 2023-08-20 DIAGNOSIS — I700 Atherosclerosis of aorta: Secondary | ICD-10-CM

## 2023-08-20 DIAGNOSIS — I2729 Other secondary pulmonary hypertension: Secondary | ICD-10-CM

## 2023-08-20 NOTE — Assessment & Plan Note (Signed)
 Normalized with e calcium  supplementation and calcitriol , dose adjust as needed stopping vitamin D  as level was 98 pm  May 2    Lab Results  Component Value Date   CALCIUM  8.5 01/22/2023   PHOS 4.2 06/11/2019

## 2023-08-20 NOTE — Patient Instructions (Addendum)
 Stop your Vitamin megadose  supplement  that you were taking weekly.   your level was recently borderline high   Continue chewable calcium  . It has 800 Ius of calcium  in each dose. Ok to continue   Ok to increase claritin  to 2 times daily , or change dosing to nighttime   Ol to add flonase  if sneezing persists    Your personal best weight was 215 in 2018

## 2023-08-20 NOTE — Progress Notes (Unsigned)
 Subjective:  Patient ID: Lauren Bartlett, female    DOB: [birth year]  Age: 68 y.o. MRN: [id number]  CC: There were no encounter diagnoses.   HPI Lauren Bartlett presents for  Chief Complaint  Patient presents with   Medical Management of Chronic Issues    6 month follow up     Hypocalcemia  Grief: several friends recently   Outpatient Medications Prior to Visit  Medication Sig Dispense Refill   acetaminophen  (TYLENOL ) 500 MG tablet Take 500 mg by mouth 2 (two) times daily as needed for moderate pain or headache.     albuterol  (PROVENTIL ) (2.5 MG/3ML) 0.083% nebulizer solution INHALE THE CONTENTS OF 1 VIAL  VIA NEBULIZER EVERY 6 HOURS AS  NEEDED FOR WHEEZING OR FOR  SHORTNESS OF BREATH 1260 mL 2   albuterol  (VENTOLIN  HFA) 108 (90 Base) MCG/ACT inhaler INHALE 2 INHALATIONS BY  MOUTH INTO THE LUNGS EVERY  6 HOURS AS NEEDED FOR  WHEEZING OR SHORTNESS OF  BREATH (USE WITH SPACER) 18 g 6   aspirin  EC 81 MG EC tablet Take 1 tablet (81 mg total) by mouth daily.     budesonide  (PULMICORT ) 0.25 MG/2ML nebulizer solution Take 2 mLs (0.25 mg total) by nebulization in the morning and at bedtime. 60 mL 12   calcitRIOL  (ROCALTROL ) 0.25 MCG capsule TAKE 1 CAPSULE BY MOUTH DAILY 100 capsule 2   Calcium  Carb-Cholecalciferol 600-800 MG-UNIT TABS Take 1 tablet by mouth in the morning, at noon, and at bedtime.      docusate sodium  (COLACE) 100 MG capsule Take 100 mg by mouth daily.     famotidine  (PEPCID  AC) 10 MG chewable tablet Chew 20 mg by mouth daily as needed for heartburn.      ferrous sulfate  325 (65 FE) MG tablet Take 325 mg by mouth daily with breakfast.     Fluticasone -Umeclidin-Vilant (TRELEGY ELLIPTA ) 200-62.5-25 MCG/ACT AEPB USE 1 INHALATION BY MOUTH DAILY 180 each 1   furosemide  (LASIX ) 20 MG tablet TAKE 1 TABLET BY MOUTH DAILY .  MAY TAKE AN ADDITIONAL TABLET AS NEEDED FOR WEIGHT GAIN OF 2 LBS  OVERNIGHT OR 5 LBS IN 1 WEEK 200 tablet 2   hydrochlorothiazide  (HYDRODIURIL ) 25 MG tablet Take 25  mg by mouth.     levothyroxine  (SYNTHROID ) 125 MCG tablet Take 1 tablet (125 mcg total) by mouth daily. 90 tablet 3   lidocaine  (LIDODERM ) 5 % Place 1 patch onto the skin every 12 (twelve) hours. Remove & Discard patch within 12 hours or as directed by MD 15 patch 0   loratadine  (CLARITIN ) 10 MG tablet Take 10 mg by mouth daily.     losartan  (COZAAR ) 50 MG tablet Take 1 tablet (50 mg total) by mouth daily. 100 tablet 2   metoprolol  tartrate (LOPRESSOR ) 25 MG tablet      OXYGEN  Inhale 2 L into the lungs at bedtime as needed (at bedtime and as needed).     oxymetazoline  (AFRIN) 0.05 % nasal spray Place 1 spray into both nostrils 2 (two) times daily. As needed for nose bleed     polyethylene glycol powder (GLYCOLAX /MIRALAX ) powder Take 17 g by mouth 2 (two) times daily as needed. 3350 g 1   traMADol  (ULTRAM ) 50 MG tablet Take 1-2 tablets (50-100 mg total) by mouth every 4 (four) hours as needed for moderate pain. 30 tablet 0   vitamin B-12 (CYANOCOBALAMIN ) 1000 MCG tablet Take 1 tablet (1,000 mcg total) by mouth daily. 90 tablet 3   Vitamin D ,  Ergocalciferol , (DRISDOL ) 1.25 MG (50000 UNIT) CAPS capsule TAKE 1 CAPSULE BY MOUTH 1 TIME A WEEK (Patient taking differently: TAKE 1 CAPSULE BY MOUTH 2 times monthly) 12 capsule 3   doxycycline  (VIBRA -TABS) 100 MG tablet Take 1 tablet (100 mg total) by mouth 2 (two) times daily. (Patient not taking: Reported on 08/20/2023) 14 tablet 0   No facility-administered medications prior to visit.    Review of Systems;  Patient denies headache, fevers, malaise, unintentional weight loss, skin rash, eye pain, sinus congestion and sinus pain, sore throat, dysphagia,  hemoptysis , cough, dyspnea, wheezing, chest pain, palpitations, orthopnea, edema, abdominal pain, nausea, melena, diarrhea, constipation, flank pain, dysuria, hematuria, urinary  Frequency, nocturia, numbness, tingling, seizures,  Focal weakness, Loss of consciousness,  Tremor, insomnia, depression, anxiety,  and suicidal ideation.      Objective:  BP 120/68   Pulse 94   Ht 5\' 2"  (1.575 m)   Wt 248 lb (112.5 kg)   SpO2 96%   BMI 45.36 kg/m   BP Readings from Last 3 Encounters:  08/20/23 120/68  08/14/23 132/86  05/07/23 122/80    Wt Readings from Last 3 Encounters:  08/20/23 248 lb (112.5 kg)  08/14/23 243 lb (110.2 kg)  05/07/23 254 lb (115.2 kg)    Physical Exam  Lab Results  Component Value Date   HGBA1C 5.5 01/22/2023   HGBA1C 5.5 03/04/2022   HGBA1C 5.6 12/20/2020    Lab Results  Component Value Date   CREATININE 0.89 01/22/2023   CREATININE 0.85 08/19/2022   CREATININE 0.83 03/04/2022    Lab Results  Component Value Date   WBC 6.6 01/22/2023   HGB 13.9 01/22/2023   HCT 43.5 01/22/2023   PLT 172.0 01/22/2023   GLUCOSE 82 01/22/2023   CHOL 199 01/22/2023   TRIG 95.0 01/22/2023   HDL 64.50 01/22/2023   LDLDIRECT 99.0 01/22/2023   LDLCALC 115 (H) 01/22/2023   ALT 15 01/22/2023   AST 16 01/22/2023   NA 141 01/22/2023   K 4.1 01/22/2023   CL 100 01/22/2023   CREATININE 0.89 01/22/2023   BUN 18 01/22/2023   CO2 34 (H) 01/22/2023   TSH 2.00 03/17/2023   INR 1.1 (H) 06/13/2020   HGBA1C 5.5 01/22/2023   MICROALBUR <0.7 01/22/2023    MM 3D SCREENING MAMMOGRAM BILATERAL BREAST Result Date: 06/22/2023 CLINICAL DATA:  Screening. EXAM: DIGITAL SCREENING BILATERAL MAMMOGRAM WITH TOMOSYNTHESIS AND CAD TECHNIQUE: Bilateral screening digital craniocaudal and mediolateral oblique mammograms were obtained. Bilateral screening digital breast tomosynthesis was performed. The images were evaluated with computer-aided detection. Best images possible per technologist communication. COMPARISON:  Previous exam(s). ACR Breast Density Category b: There are scattered areas of fibroglandular density. FINDINGS: There are no findings suspicious for malignancy. IMPRESSION: No mammographic evidence of malignancy. A result letter of this screening mammogram will be mailed directly to  the patient. RECOMMENDATION: Screening mammogram in one year. (Code:[id number]-B-01Y) BI-RADS CATEGORY  1: Negative. Electronically Signed   By: Clancy Crimes M.D.   On: 06/22/2023 13:16    Assessment & Plan:  .There are no diagnoses linked to this encounter.   I spent 34 minutes on the day of this face to face encounter reviewing patient's  most recent visit with cardiology,  nephrology,  and neurology,  prior relevant surgical and non surgical procedures, recent  labs and imaging studies, counseling on weight management,  reviewing the assessment and plan with patient, and post visit ordering and reviewing of  diagnostics and therapeutics with patient  .  Follow-up: No follow-ups on file.   Thersia Flax, MD

## 2023-08-21 ENCOUNTER — Other Ambulatory Visit: Payer: Self-pay | Admitting: Internal Medicine

## 2023-08-21 DIAGNOSIS — J449 Chronic obstructive pulmonary disease, unspecified: Secondary | ICD-10-CM

## 2023-08-24 ENCOUNTER — Encounter: Payer: Self-pay | Admitting: Internal Medicine

## 2023-08-24 NOTE — Assessment & Plan Note (Signed)
 Leading to fear of falling and reduced physical activity .  She denies dizziness, vertigo, and orthostasis.  Encouraged to resume walking daily

## 2023-08-24 NOTE — Assessment & Plan Note (Addendum)
 T scores were normal in August 2023 .  She has been supplementing calcium  and vitamin D .  Repeat in 2028

## 2023-08-24 NOTE — Assessment & Plan Note (Signed)
 She Continues to require nocturnal use of supplemental oxygen  at 2 L/min due to  pnocturnal hyoxemia secondary to pulmonary hypertension

## 2023-08-24 NOTE — Assessment & Plan Note (Signed)
 She is in sinus  rhythm today and denies racing heart,.  She is not dyspneic at rest.  Encouraged to resume walking daily

## 2023-08-24 NOTE — Assessment & Plan Note (Signed)
She has deferred statin use repeatedly due to prior trials causing myalgias.

## 2023-08-24 NOTE — Assessment & Plan Note (Signed)
 Resolved with supplementation .  Advised to continue calcium /D but stop the bi weekly megadse given current level of 98

## 2023-09-06 DIAGNOSIS — J449 Chronic obstructive pulmonary disease, unspecified: Secondary | ICD-10-CM | POA: Diagnosis not present

## 2023-09-09 ENCOUNTER — Other Ambulatory Visit: Payer: Self-pay | Admitting: Internal Medicine

## 2023-09-09 DIAGNOSIS — E034 Atrophy of thyroid (acquired): Secondary | ICD-10-CM

## 2023-10-06 DIAGNOSIS — J449 Chronic obstructive pulmonary disease, unspecified: Secondary | ICD-10-CM | POA: Diagnosis not present

## 2023-10-14 ENCOUNTER — Ambulatory Visit
Admission: EM | Admit: 2023-10-14 | Discharge: 2023-10-14 | Disposition: A | Discharge status: Home / Self Care | Attending: Family Medicine | Admitting: Family Medicine

## 2023-10-14 ENCOUNTER — Encounter: Payer: Self-pay | Admitting: Emergency Medicine

## 2023-10-14 ENCOUNTER — Ambulatory Visit (INDEPENDENT_AMBULATORY_CARE_PROVIDER_SITE_OTHER)

## 2023-10-14 DIAGNOSIS — R0602 Shortness of breath: Secondary | ICD-10-CM | POA: Diagnosis not present

## 2023-10-14 DIAGNOSIS — R9389 Abnormal findings on diagnostic imaging of other specified body structures: Secondary | ICD-10-CM | POA: Diagnosis not present

## 2023-10-14 DIAGNOSIS — J069 Acute upper respiratory infection, unspecified: Secondary | ICD-10-CM | POA: Diagnosis not present

## 2023-10-14 DIAGNOSIS — J441 Chronic obstructive pulmonary disease with (acute) exacerbation: Secondary | ICD-10-CM | POA: Diagnosis not present

## 2023-10-14 DIAGNOSIS — J4489 Other specified chronic obstructive pulmonary disease: Secondary | ICD-10-CM | POA: Diagnosis not present

## 2023-10-14 DIAGNOSIS — R051 Acute cough: Secondary | ICD-10-CM

## 2023-10-14 DIAGNOSIS — R059 Cough, unspecified: Secondary | ICD-10-CM | POA: Diagnosis not present

## 2023-10-14 LAB — RESP PANEL BY RT-PCR (RSV, FLU A&B, COVID)  RVPGX2
Influenza A by PCR: NEGATIVE
Influenza B by PCR: NEGATIVE
Resp Syncytial Virus by PCR: NEGATIVE
SARS Coronavirus 2 by RT PCR: NEGATIVE

## 2023-10-14 MED ORDER — DOXYCYCLINE HYCLATE 100 MG PO CAPS: 100.0000 mg | ORAL_CAPSULE | Freq: Two times a day (BID) | ORAL | 0 refills | Status: DC

## 2023-10-14 MED ORDER — DEXAMETHASONE SODIUM PHOSPHATE 10 MG/ML IJ SOLN
10.0000 mg | Freq: Once | INTRAMUSCULAR | Status: AC
  Administered 2023-10-14: 10 mg via INTRAMUSCULAR

## 2023-10-14 NOTE — ED Triage Notes (Signed)
 Pt presents with a productive cough, chest congestin and SOB x 2-3 days.

## 2023-10-14 NOTE — Discharge Instructions (Addendum)
 Lauren Bartlett, your RSV, influenza and COVID are all negative. You have a viral respiratory infection that will gradually improve over the next 7-10 days. Cough may last up to 3 weeks.   If your were prescribed medication, stop by the pharmacy to pick them up.   You can take Tylenol  and/or Ibuprofen as needed for fever reduction and pain relief.    For cough: honey 1/2 to 1 teaspoon (you can dilute the honey in water or another fluid).  You can also use guaifenesin  and dextromethorphan  for cough. You can use a humidifier for chest congestion and cough.  If you don't have a humidifier, you can sit in the bathroom with the hot shower running.     For congestion: take a daily anti-histamine like Zyrtec , Claritin , and a oral decongestant, such as pseudoephedrine .  You can also use Flonase  1-2 sprays in each nostril daily.     It is important to stay hydrated: drink plenty of fluids (water, gatorade/powerade/pedialyte, juices, or teas) to keep your throat moisturized and help further relieve irritation/discomfort.    Return or go to the Emergency Department if symptoms worsen or do not improve in the next few days

## 2023-10-14 NOTE — ED Provider Notes (Signed)
 [facility]    CSN: [id number] Arrival date & time: 10/14/23  1012      History   Chief Complaint Chief Complaint  Patient presents with   Cough   Nasal Congestion   Shortness of Breath    HPI Lauren Bartlett is a 68 y.o. female.   HPI  History obtained from the patient. Lauren Bartlett presents for productive cough, nasal congestin with shortness of breath that started 2-3 days ago. The people who sit out on the porch have been smoking and the smoke comes into her apartment.  Has been using SafeTussin cough medication which is helping some.  Uses Afrin nasal spray.     Has COPD without history of smoking. Has exposure to second hand smoke.      Past Medical History:  Diagnosis Date   Anxiety    Asthmatic bronchitis , chronic (HCC)    Bleeding from the nose    Chronic diastolic congestive heart failure (HCC)    a. 03/2016 Echo: EF 55-60%, Gr2 DD; b. 08/2016 Echo: EF 60-65%, nl diast fxn; c. 01/2019 Echo: EF 55-60%. Nl RV fxn.   Congenital absence of one kidney    COPD (chronic obstructive pulmonary disease) (HCC)    a. 02/2017 PFT: mod/sev obstructive airway dzs w/ significant bronchodilator response.   Dep[facility]    [facility]   Dyspnea    Emphysema/COPD [facility])    GERD (gastroesophageal reflux disease)    History of cardiac cath    a. 05/2016 Cath: nl cors.   History of sciatica    Hypertension    Hypothyroidism    Mitral Valve Prolapse & Severe mitral regurgitation s/p MVR    a. 03/2016 Echo: severe MVP involving the posterior leaflet, sev MR;  b. 06/2016 min invasive MV repair w/ triangular resection of flail segment (P2) of posterior leaflet, gore-tex neochord placement x 4, Sorin Memo 3D rin Annulosplasty (26mm, catalog # [id number], ser # [id number]); c. 08/2016 Echo: MV area 2.06 cm^2 (pressure 1/2 time); d. 01/2019 Echo: Mild MS w/ mean grad .   Motion sickness    cars   Pneumonia    PONV (postoperative nausea and vomiting)    Post-op Afib    a.  06/2016 following MV repair-->short-course amio. Coumadin  d/c'd 2/2 epistaxis.   Pulmonary hypertension (HCC)    a. 08/2016 Echo: PASP ; b. 01/2019 Echo: RVSP 45.15mmHg.   Recurrent epistaxis 06/16/2020   S/P patent foramen ovale closure    a. 06/2016 @ time of MV Repair.   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    Patient Active Problem List   Diagnosis Date Noted   Myalgia due to statin 08/19/2022   Osteopenia determined by x-ray 08/28/2021   Unsteady gait when walking 04/29/2021   Aortic atherosclerosis (HCC) 09/16/2020   PAF (paroxysmal atrial fibrillation) (HCC) 12/18/2018   Nocturnal hypoxemia due to pulmonary hypertension (HCC) 02/06/2018   Encounter for therapeutic drug monitoring 08/13/2016   S/P minimally invasive mitral valve repair 07/17/2016   S/P patent foramen ovale closure 07/17/2016   Chronic diastolic congestive heart failure (HCC)    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   Vitamin D  deficiency 01/11/2015   Allergic rhinitis 07/09/2014   S/P hysterectomy with oophorectomy 09/22/2013   Pulmonary hypertension (HCC)    Hypocalcemia 09/10/2012   Hypothyroidism 05/04/2012   Morbid obesity (HCC) 08/04/2011   Hypertension    Congenital absence of one kidney    History of sciatica  Past Surgical History:  Procedure Laterality Date   ABDOMINAL HYSTERECTOMY     BREAST BIOPSY Right 2011   [facility]< benign   CATARACT EXTRACTION W/PHACO Left 11/14/2015   Procedure: CATARACT EXTRACTION PHACO AND INTRAOCULAR LENS PLACEMENT (IOC);  Surgeon: Dene Etienne, MD;  Location: [facility];  Service: Ophthalmology;  Laterality: Left;  sleep apnea Toric   CATARACT EXTRACTION W/PHACO Right 12/19/2015   Procedure: CATARACT EXTRACTION PHACO AND INTRAOCULAR LENS PLACEMENT (IOC);  Surgeon: Dene Etienne, MD;  Location: [facility];  Service: Ophthalmology;  Laterality: Right;  ANXIETY GENEROUS IV SEDATION TORIC LEN   COMBINED HYSTERECTOMY  ABDOMINAL W/ A&P REPAIR / OOPHORECTOMY  1996   benign tumor   INNER EAR SURGERY     bilateral   MITRAL VALVE REPAIR Right 07/17/2016   Procedure: MINIMALLY INVASIVE MITRAL VALVE REPAIR (MVR) USING 26 SORIN MEMO 3D ANNULOPLASTY RING;  Surgeon: Sudie VEAR Laine, MD;  Location: [facility] OR;  Service: Open Heart Surgery;  Laterality: Right;   PATENT FORAMEN OVALE(PFO) CLOSURE N/A 07/17/2016   Procedure: PATENT FORAMEN OVALE (PFO) CLOSURE;  Surgeon: Sudie VEAR Laine, MD;  Location: [facility] OR;  Service: Open Heart Surgery;  Laterality: N/A;   RIGHT/LEFT HEART CATH AND CORONARY ANGIOGRAPHY Bilateral 06/16/2016   Procedure: Right/Left Heart Cath and Coronary Angiography;  Surgeon: Deatrice DELENA Cage, MD;  Location: [facility];  Service: Cardiovascular;  Laterality: Bilateral;   TEE WITHOUT CARDIOVERSION N/A 05/19/2016   Procedure: TRANSESOPHAGEAL ECHOCARDIOGRAM (TEE);  Surgeon: Deatrice DELENA Cage, MD;  Location: [facility] ORS;  Service: Cardiovascular;  Laterality: N/A;   TEE WITHOUT CARDIOVERSION N/A 06/09/2016   Procedure: TRANSESOPHAGEAL ECHOCARDIOGRAM (TEE);  Surgeon: Deatrice DELENA Cage, MD;  Location: [facility] ORS;  Service: Cardiovascular;  Laterality: N/A;   TEE WITHOUT CARDIOVERSION N/A 06/16/2016   Procedure: TRANSESOPHAGEAL ECHOCARDIOGRAM (TEE);  Surgeon: Deatrice DELENA Cage, MD;  Location: [facility] ORS;  Service: Cardiovascular;  Laterality: N/A;   TEE WITHOUT CARDIOVERSION N/A 07/17/2016   Procedure: TRANSESOPHAGEAL ECHOCARDIOGRAM (TEE);  Surgeon: Sudie VEAR Laine, MD;  Location: [facility] OR;  Service: Open Heart Surgery;  Laterality: N/A;   TONSILLECTOMY      OB History   No obstetric history on file.      Home Medications    Prior to Admission medications   Medication Sig Start Date End Date Taking? Authorizing Provider  doxycycline  (VIBRAMYCIN ) 100 MG capsule Take 1 capsule (100 mg total) by mouth 2 (two) times daily. 10/14/23  Yes Jyoti Harju, DO  acetaminophen  (TYLENOL ) 500 MG tablet Take 500 mg by  mouth 2 (two) times daily as needed for moderate pain or headache.    [provider]  albuterol  (PROVENTIL ) (2.5 MG/3ML) 0.083% nebulizer solution INHALE THE CONTENTS OF 1 VIAL  VIA NEBULIZER EVERY 6 HOURS AS  NEEDED FOR WHEEZING OR FOR  SHORTNESS OF BREATH 04/09/23   Tamea Dedra CROME, MD  albuterol  (VENTOLIN  HFA) 108 (90 Base) MCG/ACT inhaler INHALE 2 INHALATIONS BY  MOUTH INTO THE LUNGS EVERY  6 HOURS AS NEEDED FOR  WHEEZING OR SHORTNESS OF  BREATH (USE WITH SPACER) 05/07/23   Tamea Dedra CROME, MD  aspirin  EC 81 MG EC tablet Take 1 tablet (81 mg total) by mouth daily. 07/23/16   Barrett, Erin R, PA-C  budesonide  (PULMICORT ) 0.25 MG/2ML nebulizer solution Take 2 mLs (0.25 mg total) by nebulization in the morning and at bedtime. 01/22/23   Marylynn Verneita CROME, MD  calcitRIOL  (ROCALTROL ) 0.25 MCG capsule TAKE 1 CAPSULE BY MOUTH DAILY 06/09/23  Marylynn Verneita CROME, MD  Calcium  Carb-Cholecalciferol 600-800 MG-UNIT TABS Take 1 tablet by mouth in the morning, at noon, and at bedtime.     [provider]  docusate sodium  (COLACE) 100 MG capsule Take 100 mg by mouth daily.    [provider]  famotidine  (PEPCID  AC) 10 MG chewable tablet Chew 20 mg by mouth daily as needed for heartburn.     [provider]  ferrous sulfate  325 (65 FE) MG tablet Take 325 mg by mouth daily with breakfast.    [provider]  Fluticasone -Umeclidin-Vilant (TRELEGY ELLIPTA ) 200-62.5-25 MCG/ACT AEPB USE 1 INHALATION BY MOUTH DAILY 07/10/23   Tamea Dedra CROME, MD  furosemide  (LASIX ) 20 MG tablet TAKE 1 TABLET BY MOUTH DAILY .  MAY TAKE AN ADDITIONAL TABLET AS NEEDED FOR WEIGHT GAIN OF 2 LBS  OVERNIGHT OR 5 LBS IN 1 WEEK 08/25/23   Marylynn Verneita CROME, MD  hydrochlorothiazide  (HYDRODIURIL ) 25 MG tablet Take 25 mg by mouth.    [provider]  levothyroxine  (SYNTHROID ) 125 MCG tablet Take 1 tablet (125 mcg total) by mouth daily. 01/25/23   Marylynn Verneita CROME, MD  lidocaine  (LIDODERM ) 5 % Place 1  patch onto the skin every 12 (twelve) hours. Remove & Discard patch within 12 hours or as directed by MD 06/18/21   Viviann Pastor, MD  loratadine  (CLARITIN ) 10 MG tablet Take 10 mg by mouth daily.    [provider]  losartan  (COZAAR ) 50 MG tablet Take 1 tablet (50 mg total) by mouth daily. 04/02/23   Marylynn Verneita CROME, MD  metoprolol  tartrate (LOPRESSOR ) 25 MG tablet  10/07/16   [provider]  OXYGEN  Inhale 2 L into the lungs at bedtime as needed (at bedtime and as needed).    [provider]  oxymetazoline  (AFRIN) 0.05 % nasal spray Place 1 spray into both nostrils 2 (two) times daily. As needed for nose bleed    [provider]  polyethylene glycol powder (GLYCOLAX /MIRALAX ) powder Take 17 g by mouth 2 (two) times daily as needed. 08/03/15   Marylynn Verneita CROME, MD  traMADol  (ULTRAM ) 50 MG tablet Take 1-2 tablets (50-100 mg total) by mouth every 4 (four) hours as needed for moderate pain. 08/21/16   Marylynn Verneita CROME, MD  vitamin B-12 (CYANOCOBALAMIN ) 1000 MCG tablet Take 1 tablet (1,000 mcg total) by mouth daily. 08/14/16   Marylynn Verneita CROME, MD    Family History Family History  Problem Relation Age of Onset   Multiple sclerosis Mother    Hypertension Mother    Coronary artery disease Father    Heart disease Father    Hypertension Father    Heart disease Brother    Cancer Brother    Breast cancer Paternal Aunt     Social History Social History   Tobacco Use   Smoking status: Never   Smokeless tobacco: Never  Vaping Use   Vaping status: Never Used  Substance Use Topics   Alcohol  use: No   Drug use: No     Allergies   Codeine, Levaquin  [levofloxacin ], Penicillins, Zithromax  [azithromycin ], and Prednisone    Review of Systems Review of Systems: negative unless otherwise stated in HPI.      Physical Exam Triage Vital Signs ED Triage Vitals  Encounter Vitals Group     BP 10/14/23 1023 129/69     Girls Systolic BP Percentile --      Girls  Diastolic BP Percentile --      Boys Systolic BP Percentile --  Boys Diastolic BP Percentile --      Pulse Rate 10/14/23 1023 86     Resp 10/14/23 1023 18     Temp 10/14/23 1023 98 F (36.7 C)     Temp Source 10/14/23 1023 Oral     SpO2 10/14/23 1023 95 %     Weight --      Height --      Head Circumference --      Peak Flow --      Pain Score 10/14/23 1022 0     Pain Loc --      Pain Education --      Exclude from Growth Chart --    No data found.  Updated Vital Signs BP 129/69 (BP Location: Right Arm)   Pulse 86   Temp 98 F (36.7 C) (Oral)   Resp 18   SpO2 95%   Visual Acuity Right Eye Distance:   Left Eye Distance:   Bilateral Distance:    Right Eye Near:   Left Eye Near:    Bilateral Near:     Physical Exam GEN:     alert, non-toxic appearing female in no distress    HENT:  mucus membranes moist, oropharyngeal without lesions or erythema, clear nasal discharge EYES:   no scleral injection or discharge RESP:  no increased work of breathing, scattered wheezing bilaterally CVS:   regular rate and rhythm Skin:   warm and dry    UC Treatments / Results  Labs (all labs ordered are listed, but only abnormal results are displayed) Labs Reviewed  RESP PANEL BY RT-PCR (RSV, FLU A&B, COVID)  [id number]    EKG   Radiology DG Chest 2 View Result Date: 10/14/2023 CLINICAL DATA:  Cough.  Shortness of breath for 3 days. EXAM: CHEST - 2 VIEW COMPARISON:  07/09/2022. FINDINGS: Note is made of elevated right hemidiaphragm. There are probable atelectatic changes at the lung bases. Bilateral lung fields are otherwise clear. Bilateral costophrenic angles are clear. No large pleural effusion or pneumothorax. Stable cardio-mediastinal silhouette. Prosthetic mitral valve noted. No acute osseous abnormalities. The soft tissues are within normal limits. IMPRESSION: No active cardiopulmonary disease. Electronically Signed   By: Ree Molt M.D.   On: 10/14/2023 10:43     Procedures Procedures (including critical care time)  Medications Ordered in UC Medications  dexamethasone  (DECADRON ) injection 10 mg (10 mg Intramuscular Given 10/14/23 1108)    Initial Impression / Assessment and Plan / UC Course  I have reviewed the triage vital signs and the nursing notes.  Pertinent labs & imaging results that were available during my care of the patient were reviewed by me and considered in my medical decision making (see chart for details).       Pt is a 68 y.o. female with history of asthmatic bronchitis/ COPD and supplemental oxygen  use at bedtime who presents for worsening respiratory symptoms for the past 3 days. Lauren Bartlett is afebrile here without recent antipyretics. Satting 95% on room air. Overall pt is non-toxic appearing, well hydrated, without respiratory distress. Pulmonary exam is remarkable for scattered expiratory wheezing.  COVID, RSV and influenza testing was negative. Chest xray personally reviewed by me without focal pneumonia, pleural effusion, cardiomegaly or pneumothorax.   Given 10 mg IM Decadron  as she is not able to take prednisone .  Prescribed doxycycline  as previously prescribed.  Discussed symptomatic treatment.  Typical duration of symptoms discussed.    Return and ED precautions given and voiced understanding. Discussed MDM, treatment  plan and plan for follow-up with patient  who agrees with plan.   Radiologist impression reviewed.  No change in plan warranted.   Final Clinical Impressions(s) / UC Diagnoses   Final diagnoses:  Viral URI with cough  Asthmatic bronchitis , chronic [facility])     Discharge Instructions      Lauren Bartlett, your RSV, influenza and COVID are all negative. You have a viral respiratory infection that will gradually improve over the next 7-10 days. Cough may last up to 3 weeks.   If your were prescribed medication, stop by the pharmacy to pick them up.   You can take Tylenol  and/or Ibuprofen as needed for  fever reduction and pain relief.    For cough: honey 1/2 to 1 teaspoon (you can dilute the honey in water or another fluid).  You can also use guaifenesin  and dextromethorphan  for cough. You can use a humidifier for chest congestion and cough.  If you don't have a humidifier, you can sit in the bathroom with the hot shower running.     For congestion: take a daily anti-histamine like Zyrtec , Claritin , and a oral decongestant, such as pseudoephedrine .  You can also use Flonase  1-2 sprays in each nostril daily.     It is important to stay hydrated: drink plenty of fluids (water, gatorade/powerade/pedialyte, juices, or teas) to keep your throat moisturized and help further relieve irritation/discomfort.    Return or go to the Emergency Department if symptoms worsen or do not improve in the next few days      ED Prescriptions     Medication Sig Dispense Auth. Provider   doxycycline  (VIBRAMYCIN ) 100 MG capsule Take 1 capsule (100 mg total) by mouth 2 (two) times daily. 14 capsule Rees Matura, DO      PDMP not reviewed this encounter.   Javeion Cannedy, DO 10/15/23 [id number]

## 2023-10-30 ENCOUNTER — Ambulatory Visit (INDEPENDENT_AMBULATORY_CARE_PROVIDER_SITE_OTHER)

## 2023-10-30 ENCOUNTER — Ambulatory Visit
Admission: EM | Admit: 2023-10-30 | Discharge: 2023-10-30 | Disposition: A | Discharge status: Home / Self Care | Attending: Emergency Medicine | Admitting: Emergency Medicine

## 2023-10-30 DIAGNOSIS — R059 Cough, unspecified: Secondary | ICD-10-CM | POA: Diagnosis not present

## 2023-10-30 DIAGNOSIS — R9389 Abnormal findings on diagnostic imaging of other specified body structures: Secondary | ICD-10-CM | POA: Diagnosis not present

## 2023-10-30 DIAGNOSIS — R051 Acute cough: Secondary | ICD-10-CM

## 2023-10-30 DIAGNOSIS — J9811 Atelectasis: Secondary | ICD-10-CM | POA: Diagnosis not present

## 2023-10-30 DIAGNOSIS — J441 Chronic obstructive pulmonary disease with (acute) exacerbation: Secondary | ICD-10-CM | POA: Diagnosis present

## 2023-10-30 MED ORDER — DEXAMETHASONE SODIUM PHOSPHATE 10 MG/ML IJ SOLN
10.0000 mg | Freq: Once | INTRAMUSCULAR | Status: AC
  Administered 2023-10-30: 10 mg via INTRAMUSCULAR

## 2023-10-30 MED ORDER — SULFAMETHOXAZOLE-TRIMETHOPRIM 800-160 MG PO TABS
1.0000 | ORAL_TABLET | Freq: Two times a day (BID) | ORAL | 0 refills | Status: AC
Start: 2023-10-30 — End: 2023-11-04

## 2023-10-30 NOTE — ED Triage Notes (Signed)
 Patient reports that she was evaluated here 2 weeks ago for COPD exacerbation.  Patient was given a steroid injection and oral abx which helped her feel better for a few days.  Now c/o recurrence of chest congestion, cough with clear sputum and exertional dyspnea

## 2023-10-30 NOTE — Discharge Instructions (Addendum)
 Please use your home meds as prescribed Take antibiotic as directed If you have worsening cough or shortness of breath go to ER for further evaluation

## 2023-10-30 NOTE — ED Provider Notes (Signed)
 [facility]    CSN: [id number] Arrival date & time: 10/30/23  1027      History   Chief Complaint Chief Complaint  Patient presents with   chest congestion    HPI Lauren Bartlett is a 68 y.o. female.   68 year old female, Lauren Bartlett, presents to urgent care for evaluation of COPD exacerbation.  Patient states she was recently seen and given a steroid injection and oral antibiotics(doxycycline ) which helped her feel better for few days.  Patient now has recurrence of chest congestion, cough, clear sputum and dyspnea.  Pt denies any fever or illness exposure  The history is provided by the patient. No language interpreter was used.    Past Medical History:  Diagnosis Date   Anxiety    Asthmatic bronchitis , chronic (HCC)    Bleeding from the nose    Chronic diastolic congestive heart failure (HCC)    a. 03/2016 Echo: EF 55-60%, Gr2 DD; b. 08/2016 Echo: EF 60-65%, nl diast fxn; c. 01/2019 Echo: EF 55-60%. Nl RV fxn.   Congenital absence of one kidney    COPD (chronic obstructive pulmonary disease) (HCC)    a. 02/2017 PFT: mod/sev obstructive airway dzs w/ significant bronchodilator response.   Depression    [facility]   Dyspnea    Emphysema/COPD [facility])    GERD (gastroesophageal reflux disease)    History of cardiac cath    a. 05/2016 Cath: nl cors.   History of sciatica    Hypertension    Hypothyroidism    Mitral Valve Prolapse & Severe mitral regurgitation s/p MVR    a. 03/2016 Echo: severe MVP involving the posterior leaflet, sev MR;  b. 06/2016 min invasive MV repair w/ triangular resection of flail segment (P2) of posterior leaflet, gore-tex neochord placement x 4, Sorin Memo 3D rin Annulosplasty (26mm, catalog # [id number], ser # [id number]); c. 08/2016 Echo: MV area 2.06 cm^2 (pressure 1/2 time); d. 01/2019 Echo: Mild MS w/ mean grad .   Motion sickness    cars   Pneumonia    PONV (postoperative nausea and vomiting)    Post-op Afib    a. 06/2016  following MV repair-->short-course amio. Coumadin  d/c'd 2/2 epistaxis.   Pulmonary hypertension (HCC)    a. 08/2016 Echo: PASP ; b. 01/2019 Echo: RVSP 45.31mmHg.   Recurrent epistaxis 06/16/2020   S/P patent foramen ovale closure    a. 06/2016 @ time of MV Repair.   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    Patient Active Problem List   Diagnosis Date Noted   Acute cough 10/30/2023   Myalgia due to statin 08/19/2022   Osteopenia determined by x-ray 08/28/2021   Unsteady gait when walking 04/29/2021   Aortic atherosclerosis (HCC) 09/16/2020   PAF (paroxysmal atrial fibrillation) (HCC) 12/18/2018   Nocturnal hypoxemia due to pulmonary hypertension (HCC) 02/06/2018   Encounter for therapeutic drug monitoring 08/13/2016   S/P minimally invasive mitral valve repair 07/17/2016   S/P patent foramen ovale closure 07/17/2016   Chronic diastolic congestive heart failure (HCC)    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   COPD exacerbation (HCC) 02/03/2015   Vitamin D  deficiency 01/11/2015   Allergic rhinitis 07/09/2014   S/P hysterectomy with oophorectomy 09/22/2013   Pulmonary hypertension (HCC)    Hypocalcemia 09/10/2012   Hypothyroidism 05/04/2012   Morbid obesity (HCC) 08/04/2011   Hypertension    Congenital absence of one kidney    History of sciatica  Past Surgical History:  Procedure Laterality Date   ABDOMINAL HYSTERECTOMY     BREAST BIOPSY Right 2011   [facility]< benign   CATARACT EXTRACTION W/PHACO Left 11/14/2015   Procedure: CATARACT EXTRACTION PHACO AND INTRAOCULAR LENS PLACEMENT (IOC);  Surgeon: Dene Etienne, MD;  Location: [facility];  Service: Ophthalmology;  Laterality: Left;  sleep apnea Toric   CATARACT EXTRACTION W/PHACO Right 12/19/2015   Procedure: CATARACT EXTRACTION PHACO AND INTRAOCULAR LENS PLACEMENT (IOC);  Surgeon: Dene Etienne, MD;  Location: [facility];  Service: Ophthalmology;  Laterality: Right;  ANXIETY  GENEROUS IV SEDATION TORIC LEN   COMBINED HYSTERECTOMY ABDOMINAL W/ A&P REPAIR / OOPHORECTOMY  1996   benign tumor   INNER EAR SURGERY     bilateral   MITRAL VALVE REPAIR Right 07/17/2016   Procedure: MINIMALLY INVASIVE MITRAL VALVE REPAIR (MVR) USING 26 SORIN MEMO 3D ANNULOPLASTY RING;  Surgeon: Sudie VEAR Laine, MD;  Location: [facility] OR;  Service: Open Heart Surgery;  Laterality: Right;   PATENT FORAMEN OVALE(PFO) CLOSURE N/A 07/17/2016   Procedure: PATENT FORAMEN OVALE (PFO) CLOSURE;  Surgeon: Sudie VEAR Laine, MD;  Location: [facility] OR;  Service: Open Heart Surgery;  Laterality: N/A;   RIGHT/LEFT HEART CATH AND CORONARY ANGIOGRAPHY Bilateral 06/16/2016   Procedure: Right/Left Heart Cath and Coronary Angiography;  Surgeon: Deatrice DELENA Cage, MD;  Location: [facility];  Service: Cardiovascular;  Laterality: Bilateral;   TEE WITHOUT CARDIOVERSION N/A 05/19/2016   Procedure: TRANSESOPHAGEAL ECHOCARDIOGRAM (TEE);  Surgeon: Deatrice DELENA Cage, MD;  Location: [facility];  Service: Cardiovascular;  Laterality: N/A;   TEE WITHOUT CARDIOVERSION N/A 06/09/2016   Procedure: TRANSESOPHAGEAL ECHOCARDIOGRAM (TEE);  Surgeon: Deatrice DELENA Cage, MD;  Location: [facility];  Service: Cardiovascular;  Laterality: N/A;   TEE WITHOUT CARDIOVERSION N/A 06/16/2016   Procedure: TRANSESOPHAGEAL ECHOCARDIOGRAM (TEE);  Surgeon: Deatrice DELENA Cage, MD;  Location: [facility];  Service: Cardiovascular;  Laterality: N/A;   TEE WITHOUT CARDIOVERSION N/A 07/17/2016   Procedure: TRANSESOPHAGEAL ECHOCARDIOGRAM (TEE);  Surgeon: Sudie VEAR Laine, MD;  Location: [facility];  Service: Open Heart Surgery;  Laterality: N/A;   TONSILLECTOMY      OB History   No obstetric history on file.      Home Medications    Prior to Admission medications   Medication Sig Start Date End Date Taking? Authorizing Provider  aspirin  EC 81 MG EC tablet Take 1 tablet (81 mg total) by mouth daily. 07/23/16  Yes Barrett, Erin R, PA-C  Calcium  Carb-Cholecalciferol  600-800 MG-UNIT TABS Take 1 tablet by mouth in the morning, at noon, and at bedtime.    Yes [provider]  docusate sodium  (COLACE) 100 MG capsule Take 100 mg by mouth daily.   Yes [provider]  famotidine  (PEPCID  AC) 10 MG chewable tablet Chew 20 mg by mouth daily as needed for heartburn.    Yes [provider]  ferrous sulfate  325 (65 FE) MG tablet Take 325 mg by mouth daily with breakfast.   Yes [provider]  Fluticasone -Umeclidin-Vilant (TRELEGY ELLIPTA ) 200-62.5-25 MCG/ACT AEPB USE 1 INHALATION BY MOUTH DAILY 07/10/23  Yes Tamea Dedra CROME, MD  furosemide  (LASIX ) 20 MG tablet TAKE 1 TABLET BY MOUTH DAILY .  MAY TAKE AN ADDITIONAL TABLET AS NEEDED FOR WEIGHT GAIN OF 2 LBS  OVERNIGHT OR 5 LBS IN 1 WEEK 08/25/23  Yes Marylynn Verneita CROME, MD  hydrochlorothiazide  (HYDRODIURIL ) 25 MG tablet Take 25 mg by mouth.   Yes [provider]  levothyroxine  (SYNTHROID ) 125  MCG tablet Take 1 tablet (125 mcg total) by mouth daily. 01/25/23  Yes Marylynn Verneita CROME, MD  loratadine  (CLARITIN ) 10 MG tablet Take 10 mg by mouth daily.   Yes [provider]  losartan  (COZAAR ) 50 MG tablet Take 1 tablet (50 mg total) by mouth daily. 04/02/23  Yes Marylynn Verneita CROME, MD  OXYGEN  Inhale 2 L into the lungs at bedtime as needed (at bedtime and as needed).   Yes [provider]  sulfamethoxazole -trimethoprim  (BACTRIM  DS) 800-160 MG tablet Take 1 tablet by mouth 2 (two) times daily for 5 days. 10/30/23 11/04/23 Yes Brennah Quraishi, NP  vitamin B-12 (CYANOCOBALAMIN ) 1000 MCG tablet Take 1 tablet (1,000 mcg total) by mouth daily. 08/14/16  Yes Marylynn Verneita CROME, MD  acetaminophen  (TYLENOL ) 500 MG tablet Take 500 mg by mouth 2 (two) times daily as needed for moderate pain or headache.    [provider]  albuterol  (PROVENTIL ) (2.5 MG/3ML) 0.083% nebulizer solution INHALE THE CONTENTS OF 1 VIAL  VIA NEBULIZER EVERY 6 HOURS AS  NEEDED FOR WHEEZING OR FOR  SHORTNESS OF  BREATH 04/09/23   Tamea Dedra CROME, MD  albuterol  (VENTOLIN  HFA) 108 (90 Base) MCG/ACT inhaler INHALE 2 INHALATIONS BY  MOUTH INTO THE LUNGS EVERY  6 HOURS AS NEEDED FOR  WHEEZING OR SHORTNESS OF  BREATH (USE WITH SPACER) 05/07/23   Tamea Dedra CROME, MD  budesonide  (PULMICORT ) 0.25 MG/2ML nebulizer solution Take 2 mLs (0.25 mg total) by nebulization in the morning and at bedtime. 01/22/23   Marylynn Verneita CROME, MD  calcitRIOL  (ROCALTROL ) 0.25 MCG capsule TAKE 1 CAPSULE BY MOUTH DAILY 06/09/23   Marylynn Verneita CROME, MD  lidocaine  (LIDODERM ) 5 % Place 1 patch onto the skin every 12 (twelve) hours. Remove & Discard patch within 12 hours or as directed by MD 06/18/21   Viviann Pastor, MD  metoprolol  tartrate (LOPRESSOR ) 25 MG tablet  10/07/16   [provider]  oxymetazoline  (AFRIN) 0.05 % nasal spray Place 1 spray into both nostrils 2 (two) times daily. As needed for nose bleed    [provider]  polyethylene glycol powder (GLYCOLAX /MIRALAX ) powder Take 17 g by mouth 2 (two) times daily as needed. 08/03/15   Marylynn Verneita CROME, MD  traMADol  (ULTRAM ) 50 MG tablet Take 1-2 tablets (50-100 mg total) by mouth every 4 (four) hours as needed for moderate pain. 08/21/16   Marylynn Verneita CROME, MD    Family History Family History  Problem Relation Age of Onset   Multiple sclerosis Mother    Hypertension Mother    Coronary artery disease Father    Heart disease Father    Hypertension Father    Heart disease Brother    Cancer Brother    Breast cancer Paternal Aunt     Social History Social History   Tobacco Use   Smoking status: Never   Smokeless tobacco: Never  Vaping Use   Vaping status: Never Used  Substance Use Topics   Alcohol  use: No   Drug use: No     Allergies   Codeine, Levaquin  [levofloxacin ], Penicillins, Zithromax  [azithromycin ], and Prednisone    Review of Systems Review of Systems  Constitutional:  Negative for fever.  HENT:  Positive for congestion.   Respiratory:   Positive for cough.   All other systems reviewed and are negative.    Physical Exam Triage Vital Signs ED Triage Vitals  Encounter Vitals Group     BP 10/30/23 1043 132/82     Girls Systolic BP  Percentile --      Girls Diastolic BP Percentile --      Boys Systolic BP Percentile --      Boys Diastolic BP Percentile --      Pulse Rate 10/30/23 1043 86     Resp --      Temp 10/30/23 1043 98.1 F (36.7 C)     Temp Source 10/30/23 1043 Oral     SpO2 10/30/23 1043 92 %     Weight --      Height --      Head Circumference --      Peak Flow --      Pain Score 10/30/23 1039 0     Pain Loc --      Pain Education --      Exclude from Growth Chart --    No data found.  Updated Vital Signs BP 132/82 (BP Location: Right Arm)   Pulse 86   Temp 98.1 F (36.7 C) (Oral)   SpO2 92%   Visual Acuity Right Eye Distance:   Left Eye Distance:   Bilateral Distance:    Right Eye Near:   Left Eye Near:    Bilateral Near:     Physical Exam Vitals and nursing note reviewed.  Constitutional:      General: She is not in acute distress.    Appearance: She is well-developed.  HENT:     Head: Normocephalic.     Right Ear: Tympanic membrane is retracted.     Left Ear: Tympanic membrane is retracted.     Nose: Congestion present.     Mouth/Throat:     Lips: Pink.     Mouth: Mucous membranes are moist.     Pharynx: Oropharynx is clear. Uvula midline.  Eyes:     General: Lids are normal.     Conjunctiva/sclera: Conjunctivae normal.     Pupils: Pupils are equal, round, and reactive to light.  Neck:     Trachea: No tracheal deviation.  Cardiovascular:     Rate and Rhythm: Normal rate and regular rhythm.     Heart sounds: Normal heart sounds. No murmur heard. Pulmonary:     Effort: Pulmonary effort is normal.     Breath sounds: Examination of the right-upper field reveals decreased breath sounds. Examination of the left-upper field reveals decreased breath sounds. Examination of the  right-lower field reveals decreased breath sounds. Examination of the left-lower field reveals decreased breath sounds. Decreased breath sounds present.  Abdominal:     General: Bowel sounds are normal.     Palpations: Abdomen is soft.     Tenderness: There is no abdominal tenderness.  Musculoskeletal:        General: Normal range of motion.     Cervical back: Normal range of motion.  Lymphadenopathy:     Cervical: No cervical adenopathy.  Skin:    General: Skin is warm and dry.     Findings: No rash.  Neurological:     General: No focal deficit present.     Mental Status: She is alert and oriented to person, place, and time.     GCS: GCS eye subscore is 4. GCS verbal subscore is 5. GCS motor subscore is 6.  Psychiatric:        Speech: Speech normal.        Behavior: Behavior normal. Behavior is cooperative.      UC Treatments / Results  Labs (all labs ordered are listed, but only abnormal results are  displayed) Labs Reviewed - No data to display  EKG   Radiology DG Chest 2 View Result Date: 10/30/2023 CLINICAL DATA:  cough EXAM: CHEST - 2 VIEW COMPARISON:  October 14, 2023 FINDINGS: Elevation of the right hemidiaphragm with right basilar atelectasis. No focal airspace consolidation, pleural effusion, or pneumothorax. No cardiomegaly. No acute fracture or destructive lesion. Multilevel thoracic osteophytosis. IMPRESSION: No acute cardiopulmonary abnormality. Electronically Signed   By: Rogelia Myers M.D.   On: 10/30/2023 11:54    Procedures Procedures (including critical care time)  Medications Ordered in UC Medications  dexamethasone  (DECADRON ) injection 10 mg (10 mg Intramuscular Given 10/30/23 1223)    Initial Impression / Assessment and Plan / UC Course  I have reviewed the triage vital signs and the nursing notes.  Pertinent labs & imaging results that were available during my care of the patient were reviewed by me and considered in my medical decision making (see  chart for details).    Discussed exam findings and plan of care with patient, Decadron  10 mg IM in office, scripted Bactrim  as pt is allergic to PCN, azithromycin , levoquin, recently took doxycycline , strict go to ER precautions given.   Patient verbalized understanding to this provider.  Ddx: COPD exacerbation,cough,viral illness, allergies Final Clinical Impressions(s) / UC Diagnoses   Final diagnoses:  Acute cough  COPD exacerbation [facility])     Discharge Instructions      Please use your home meds as prescribed Take antibiotic as directed If you have worsening cough or shortness of breath go to ER for further evaluation     ED Prescriptions     Medication Sig Dispense Auth. Provider   sulfamethoxazole -trimethoprim  (BACTRIM  DS) 800-160 MG tablet Take 1 tablet by mouth 2 (two) times daily for 5 days. 10 tablet Ayriel Texidor, Rilla, NP      PDMP not reviewed this encounter.   Aminta Rilla, NP 10/30/23 1909

## 2023-11-05 ENCOUNTER — Other Ambulatory Visit: Payer: Self-pay | Admitting: Pulmonary Disease

## 2023-11-06 DIAGNOSIS — J449 Chronic obstructive pulmonary disease, unspecified: Secondary | ICD-10-CM | POA: Diagnosis not present

## 2023-11-13 ENCOUNTER — Other Ambulatory Visit: Payer: Self-pay | Admitting: Internal Medicine

## 2023-12-02 ENCOUNTER — Other Ambulatory Visit: Payer: Self-pay | Admitting: Internal Medicine

## 2023-12-02 DIAGNOSIS — E034 Atrophy of thyroid (acquired): Secondary | ICD-10-CM

## 2023-12-03 ENCOUNTER — Ambulatory Visit
Admission: EM | Admit: 2023-12-03 | Discharge: 2023-12-03 | Disposition: A | Discharge status: Home / Self Care | Attending: Emergency Medicine | Admitting: Emergency Medicine

## 2023-12-03 DIAGNOSIS — J441 Chronic obstructive pulmonary disease with (acute) exacerbation: Secondary | ICD-10-CM | POA: Diagnosis not present

## 2023-12-03 LAB — RESP PANEL BY RT-PCR (FLU A&B, COVID) ARPGX2
Influenza A by PCR: NEGATIVE
Influenza B by PCR: NEGATIVE
SARS Coronavirus 2 by RT PCR: NEGATIVE

## 2023-12-03 MED ORDER — DOXYCYCLINE HYCLATE 100 MG PO CAPS: 100.0000 mg | ORAL_CAPSULE | Freq: Two times a day (BID) | ORAL | 0 refills | Status: AC

## 2023-12-03 MED ORDER — DEXAMETHASONE SODIUM PHOSPHATE 10 MG/ML IJ SOLN
10.0000 mg | Freq: Once | INTRAMUSCULAR | Status: AC
  Administered 2023-12-03: 10 mg via INTRAMUSCULAR

## 2023-12-03 NOTE — ED Provider Notes (Signed)
 [facility]    CSN: [id number] Arrival date & time: 12/03/23  1040      History   Chief Complaint Chief Complaint  Patient presents with   Covid Exposure    HPI Lauren Bartlett is a 68 y.o. female.   68 year old female, Lauren Bartlett, presents to urgent care for evaluation of cough,wheezing that started today. Pt reports her caregiver/aide has covid. Pt requesting steroid injection.  DDX: COPD,asthmatic bronchitis  The history is provided by the patient. No language interpreter was used.    Past Medical History:  Diagnosis Date   Anxiety    Asthmatic bronchitis , chronic (HCC)    Bleeding from the nose    Chronic diastolic congestive heart failure (HCC)    a. 03/2016 Echo: EF 55-60%, Gr2 DD; b. 08/2016 Echo: EF 60-65%, nl diast fxn; c. 01/2019 Echo: EF 55-60%. Nl RV fxn.   Congenital absence of one kidney    COPD (chronic obstructive pulmonary disease) (HCC)    a. 02/2017 PFT: mod/sev obstructive airway dzs w/ significant bronchodilator response.   Depression    [facility]   Dyspnea    Emphysema/COPD [facility])    GERD (gastroesophageal reflux disease)    History of cardiac cath    a. 05/2016 Cath: nl cors.   History of sciatica    Hypertension    Hypothyroidism    Mitral Valve Prolapse & Severe mitral regurgitation s/p MVR    a. 03/2016 Echo: severe MVP involving the posterior leaflet, sev MR;  b. 06/2016 min invasive MV repair w/ triangular resection of flail segment (P2) of posterior leaflet, gore-tex neochord placement x 4, Sorin Memo 3D rin Annulosplasty (26mm, catalog # [id number], ser # [id number]); c. 08/2016 Echo: MV area 2.06 cm^2 (pressure 1/2 time); d. 01/2019 Echo: Mild MS w/ mean grad .   Motion sickness    cars   Pneumonia    PONV (postoperative nausea and vomiting)    Post-op Afib    a. 06/2016 following MV repair-->short-course amio. Coumadin  d/c'd 2/2 epistaxis.   Pulmonary hypertension (HCC)    a. 08/2016 Echo: PASP ; b. 01/2019  Echo: RVSP 45.62mmHg.   Recurrent epistaxis 06/16/2020   S/P patent foramen ovale closure    a. 06/2016 @ time of MV Repair.   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    Patient Active Problem List   Diagnosis Date Noted   Acute cough 10/30/2023   Myalgia due to statin 08/19/2022   Osteopenia determined by x-ray 08/28/2021   Unsteady gait when walking 04/29/2021   Aortic atherosclerosis (HCC) 09/16/2020   PAF (paroxysmal atrial fibrillation) (HCC) 12/18/2018   Nocturnal hypoxemia due to pulmonary hypertension (HCC) 02/06/2018   Encounter for therapeutic drug monitoring 08/13/2016   S/P minimally invasive mitral valve repair 07/17/2016   S/P patent foramen ovale closure 07/17/2016   Chronic diastolic congestive heart failure (HCC)    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   COPD exacerbation (HCC) 02/03/2015   Vitamin D  deficiency 01/11/2015   Allergic rhinitis 07/09/2014   S/P hysterectomy with oophorectomy 09/22/2013   Pulmonary hypertension (HCC)    Hypocalcemia 09/10/2012   Hypothyroidism 05/04/2012   Morbid obesity (HCC) 08/04/2011   Hypertension    Congenital absence of one kidney    History of sciatica     Past Surgical History:  Procedure Laterality Date   ABDOMINAL HYSTERECTOMY     BREAST BIOPSY Right 2011   [facility]< benign  CATARACT EXTRACTION W/PHACO Left 11/14/2015   Procedure: CATARACT EXTRACTION PHACO AND INTRAOCULAR LENS PLACEMENT (IOC);  Surgeon: Dene Etienne, MD;  Location: [facility];  Service: Ophthalmology;  Laterality: Left;  sleep apnea Toric   CATARACT EXTRACTION W/PHACO Right 12/19/2015   Procedure: CATARACT EXTRACTION PHACO AND INTRAOCULAR LENS PLACEMENT (IOC);  Surgeon: Dene Etienne, MD;  Location: [facility];  Service: Ophthalmology;  Laterality: Right;  ANXIETY GENEROUS IV SEDATION TORIC LEN   COMBINED HYSTERECTOMY ABDOMINAL W/ A&P REPAIR / OOPHORECTOMY  1996   benign tumor   INNER EAR SURGERY      bilateral   MITRAL VALVE REPAIR Right 07/17/2016   Procedure: MINIMALLY INVASIVE MITRAL VALVE REPAIR (MVR) USING 26 SORIN MEMO 3D ANNULOPLASTY RING;  Surgeon: Sudie VEAR Laine, MD;  Location: [facility] OR;  Service: Open Heart Surgery;  Laterality: Right;   PATENT FORAMEN OVALE(PFO) CLOSURE N/A 07/17/2016   Procedure: PATENT FORAMEN OVALE (PFO) CLOSURE;  Surgeon: Sudie VEAR Laine, MD;  Location: [facility] OR;  Service: Open Heart Surgery;  Laterality: N/A;   RIGHT/LEFT HEART CATH AND CORONARY ANGIOGRAPHY Bilateral 06/16/2016   Procedure: Right/Left Heart Cath and Coronary Angiography;  Surgeon: Deatrice DELENA Cage, MD;  Location: [facility];  Service: Cardiovascular;  Laterality: Bilateral;   TEE WITHOUT CARDIOVERSION N/A 05/19/2016   Procedure: TRANSESOPHAGEAL ECHOCARDIOGRAM (TEE);  Surgeon: Deatrice DELENA Cage, MD;  Location: [facility] OR[facility];  Service: Cardiovascular;  Laterality: N/A;   TEE WITHOUT CARDIOVERSION N/A 06/09/2016   Procedure: TRANSESOPHAGEAL ECHOCARDIOGRAM (TEE);  Surgeon: Deatrice DELENA Cage, MD;  Location: [facility] OR[facility];  Service: Cardiovascular;  Laterality: N/A;   TEE WITHOUT CARDIOVERSION N/A 06/16/2016   Procedure: TRANSESOPHAGEAL ECHOCARDIOGRAM (TEE);  Surgeon: Deatrice DELENA Cage, MD;  Location: [facility] OR[facility];  Service: Cardiovascular;  Laterality: N/A;   TEE WITHOUT CARDIOVERSION N/A 07/17/2016   Procedure: TRANSESOPHAGEAL ECHOCARDIOGRAM (TEE);  Surgeon: Sudie VEAR Laine, MD;  Location: [facility] OR;  Service: Open Heart Surgery;  Laterality: N/A;   TONSILLECTOMY      OB History   No obstetric history on file.      Home Medications    Prior to Admission medications   Medication Sig Start Date End Date Taking? Authorizing Provider  acetaminophen  (TYLENOL ) 500 MG tablet Take 500 mg by mouth 2 (two) times daily as needed for moderate pain or headache.   Yes [provider]  albuterol  (PROVENTIL ) (2.5 MG/3ML) 0.083% nebulizer solution INHALE THE CONTENTS OF 1 VIAL  VIA NEBULIZER EVERY 6 HOURS AS   NEEDED FOR WHEEZING OR FOR  SHORTNESS OF BREATH 04/09/23  Yes Tamea Dedra CROME, MD  albuterol  (VENTOLIN  HFA) 108 (90 Base) MCG/ACT inhaler INHALE 2 INHALATIONS BY  MOUTH INTO THE LUNGS EVERY  6 HOURS AS NEEDED FOR  WHEEZING OR SHORTNESS OF  BREATH (USE WITH SPACER) 05/07/23  Yes Tamea Dedra CROME, MD  aspirin  EC 81 MG EC tablet Take 1 tablet (81 mg total) by mouth daily. 07/23/16  Yes Barrett, Erin R, PA-C  budesonide  (PULMICORT ) 0.25 MG/2ML nebulizer solution Take 2 mLs (0.25 mg total) by nebulization in the morning and at bedtime. 01/22/23  Yes Marylynn Verneita CROME, MD  calcitRIOL  (ROCALTROL ) 0.25 MCG capsule TAKE 1 CAPSULE BY MOUTH DAILY 06/09/23  Yes Tullo, Teresa L, MD  Calcium  Carb-Cholecalciferol 600-800 MG-UNIT TABS Take 1 tablet by mouth in the morning, at noon, and at bedtime.    Yes [provider]  docusate sodium  (COLACE) 100 MG capsule Take 100 mg by mouth daily.   Yes [provider]  doxycycline  (VIBRAMYCIN ) 100 MG capsule Take 1 capsule (100 mg total) by mouth 2 (two) times daily for 5 days. 12/03/23 12/08/23 Yes Atzin Buchta, Rilla, NP  famotidine  (PEPCID  AC) 10 MG chewable tablet Chew 20 mg by mouth daily as needed for heartburn.    Yes [provider]  ferrous sulfate  325 (65 FE) MG tablet Take 325 mg by mouth daily with breakfast.   Yes [provider]  furosemide  (LASIX ) 20 MG tablet TAKE 1 TABLET BY MOUTH DAILY .  MAY TAKE AN ADDITIONAL TABLET AS NEEDED FOR WEIGHT GAIN OF 2 LBS  OVERNIGHT OR 5 LBS IN 1 WEEK 08/25/23  Yes Marylynn Verneita CROME, MD  hydrochlorothiazide  (HYDRODIURIL ) 25 MG tablet Take 25 mg by mouth.   Yes [provider]  levothyroxine  (SYNTHROID ) 125 MCG tablet Take 1 tablet (125 mcg total) by mouth daily. 01/25/23  Yes Marylynn Verneita CROME, MD  lidocaine  (LIDODERM ) 5 % Place 1 patch onto the skin every 12 (twelve) hours. Remove & Discard patch within 12 hours or as directed by MD 06/18/21  Yes Viviann Pastor, MD  loratadine  (CLARITIN ) 10 MG  tablet Take 10 mg by mouth daily.   Yes [provider]  losartan  (COZAAR ) 50 MG tablet TAKE 1 TABLET BY MOUTH DAILY 11/16/23  Yes Tullo, Teresa L, MD  OXYGEN  Inhale 2 L into the lungs at bedtime as needed (at bedtime and as needed).   Yes [provider]  oxymetazoline  (AFRIN) 0.05 % nasal spray Place 1 spray into both nostrils 2 (two) times daily. As needed for nose bleed   Yes [provider]  polyethylene glycol powder (GLYCOLAX /MIRALAX ) powder Take 17 g by mouth 2 (two) times daily as needed. 08/03/15  Yes Marylynn Verneita CROME, MD  traMADol  (ULTRAM ) 50 MG tablet Take 1-2 tablets (50-100 mg total) by mouth every 4 (four) hours as needed for moderate pain. 08/21/16  Yes Marylynn Verneita CROME, MD  TRELEGY ELLIPTA  200-62.5-25 MCG/ACT AEPB USE 1 INHALATION BY MOUTH ONCE  DAILY AT THE SAME TIME EACH DAY 11/06/23  Yes Tamea Dedra CROME, MD  vitamin B-12 (CYANOCOBALAMIN ) 1000 MCG tablet Take 1 tablet (1,000 mcg total) by mouth daily. 08/14/16  Yes Marylynn Verneita CROME, MD  metoprolol  tartrate (LOPRESSOR ) 25 MG tablet  10/07/16   [provider]    Family History Family History  Problem Relation Age of Onset   Multiple sclerosis Mother    Hypertension Mother    Coronary artery disease Father    Heart disease Father    Hypertension Father    Heart disease Brother    Cancer Brother    Breast cancer Paternal Aunt     Social History Social History   Tobacco Use   Smoking status: Never   Smokeless tobacco: Never  Vaping Use   Vaping status: Never Used  Substance Use Topics   Alcohol  use: No   Drug use: No     Allergies   Codeine, Levaquin  [levofloxacin ], Penicillins, Zithromax  [azithromycin ], and Prednisone    Review of Systems Review of Systems  Constitutional:  Negative for fever.  Respiratory:  Positive for cough and wheezing.   All other systems reviewed and are negative.    Physical Exam Triage Vital Signs ED Triage Vitals  Encounter Vitals Group     BP  12/03/23 1141 135/86     Girls Systolic BP Percentile --      Girls Diastolic BP Percentile --      Boys Systolic BP Percentile --  Boys Diastolic BP Percentile --      Pulse Rate 12/03/23 1141 88     Resp 12/03/23 1141 12     Temp 12/03/23 1141 97.9 F (36.6 C)     Temp Source 12/03/23 1141 Oral     SpO2 12/03/23 1141 93 %     Weight 12/03/23 1139 248 lb 3.2 oz (112.6 kg)     Height --      Head Circumference --      Peak Flow --      Pain Score 12/03/23 1139 0     Pain Loc --      Pain Education --      Exclude from Growth Chart --    No data found.  Updated Vital Signs BP 135/86 (BP Location: Left Arm)   Pulse 88   Temp 97.9 F (36.6 C) (Oral)   Resp 12   Wt 248 lb 3.2 oz (112.6 kg)   SpO2 93%   BMI 45.40 kg/m   Visual Acuity Right Eye Distance:   Left Eye Distance:   Bilateral Distance:    Right Eye Near:   Left Eye Near:    Bilateral Near:     Physical Exam Vitals and nursing note reviewed.  Constitutional:      Appearance: She is well-developed and well-groomed.  Cardiovascular:     Rate and Rhythm: Normal rate and regular rhythm.     Heart sounds: Normal heart sounds.  Pulmonary:     Effort: Pulmonary effort is normal.     Breath sounds: Normal air entry. Examination of the right-upper field reveals decreased breath sounds. Examination of the left-upper field reveals decreased breath sounds. Examination of the right-lower field reveals decreased breath sounds. Examination of the left-lower field reveals decreased breath sounds. Decreased breath sounds present.  Neurological:     General: No focal deficit present.     Mental Status: She is alert and oriented to person, place, and time.     GCS: GCS eye subscore is 4. GCS verbal subscore is 5. GCS motor subscore is 6.  Psychiatric:        Attention and Perception: Attention normal.        Mood and Affect: Mood normal.        Speech: Speech normal.        Behavior: Behavior normal. Behavior is  cooperative.      UC Treatments / Results  Labs (all labs ordered are listed, but only abnormal results are displayed) Labs Reviewed  RESP PANEL BY RT-PCR (FLU A&B, COVID) [id number]    EKG   Radiology No results found.  Procedures Procedures (including critical care time)  Medications Ordered in UC Medications  dexamethasone  (DECADRON ) injection 10 mg (10 mg Intramuscular Given 12/03/23 1246)    Initial Impression / Assessment and Plan / UC Course  I have reviewed the triage vital signs and the nursing notes.  Pertinent labs & imaging results that were available during my care of the patient were reviewed by me and considered in my medical decision making (see chart for details).    Discussed exam findings and plan of care with patient, Decadron  10 mg IM in office, scripted doxycycline  for COPD bronchitis/exacerbation, encourage patient follow-up with her pulmonologist and PCP for further management -call for appointment , strict go to ER precautions given.   Patient verbalized understanding to this provider.  Ddx: COPD exacerbation, bronchitis, covid exposure,allergies Final Clinical Impressions(s) / UC Diagnoses   Final diagnoses:  COPD exacerbation [facility])     Discharge Instructions      Please call today and follow up with your pulmonologist and PCP for further management of COPD. Your covid test is negative Take home meds as prescribed, take doxycycline  as prescribed. If you develop chest pain, shortness of breath, or palpitations- go immediately to the emergency room for further evaluation     ED Prescriptions     Medication Sig Dispense Auth. Provider   doxycycline  (VIBRAMYCIN ) 100 MG capsule Take 1 capsule (100 mg total) by mouth 2 (two) times daily for 5 days. 10 capsule Claudie Brickhouse, NP      PDMP not reviewed this encounter.   Aminta Loose, NP 12/03/23 1434

## 2023-12-03 NOTE — ED Triage Notes (Signed)
 Pt c/o covid exposure  Pt states that she is having wheezing and coughing  Pt states she has COPD  Pt states that her aid has covid.

## 2023-12-03 NOTE — Discharge Instructions (Addendum)
 Please call today and follow up with your pulmonologist and PCP for further management of COPD. Your covid test is negative Take home meds as prescribed, take doxycycline  as prescribed. If you develop chest pain, shortness of breath, or palpitations- go immediately to the emergency room for further evaluation

## 2023-12-04 ENCOUNTER — Other Ambulatory Visit: Payer: Self-pay | Admitting: Internal Medicine

## 2023-12-04 MED ORDER — LEVOTHYROXINE SODIUM 125 MCG PO TABS: 125.0000 ug | ORAL_TABLET | Freq: Every day | ORAL | 0 refills | Status: DC

## 2023-12-04 NOTE — Telephone Encounter (Signed)
 Copied from CRM [id number]. Topic: Clinical - Medication Refill >> Dec 04, 2023 10:24 AM [place] E wrote: Medication: levothyroxine  (SYNTHROID ) 125 MCG tablet  Has the patient contacted their pharmacy? Yes (Agent: If no, request that the patient contact the pharmacy for the refill. If patient does not wish to contact the pharmacy document the reason why and proceed with request.) (Agent: If yes, when and what did the pharmacy advise?)  This is the patient's preferred pharmacy:   [facility] [place] [place] - [address] [place] [address] Phone: [phone] Fax: [phone]  Is this the correct pharmacy for this prescription? Yes If no, delete pharmacy and type the correct one.   Has the prescription been filled recently? No  Is the patient out of the medication? Yes  Has the patient been seen for an appointment in the last year OR does the patient have an upcoming appointment? Yes  Can we respond through MyChart? No  Agent: Please be advised that Rx refills may take up to 3 business days. We ask that you follow-up with your pharmacy.

## 2023-12-07 DIAGNOSIS — J449 Chronic obstructive pulmonary disease, unspecified: Secondary | ICD-10-CM | POA: Diagnosis not present

## 2023-12-09 ENCOUNTER — Ambulatory Visit: Attending: Medical | Admitting: Medical

## 2023-12-09 ENCOUNTER — Encounter: Payer: Self-pay | Admitting: Medical

## 2023-12-09 VITALS — BP 130/76 | HR 81 | Ht 63.0 in | Wt 249.6 lb

## 2023-12-09 DIAGNOSIS — I050 Rheumatic mitral stenosis: Secondary | ICD-10-CM | POA: Diagnosis not present

## 2023-12-09 DIAGNOSIS — I5032 Chronic diastolic (congestive) heart failure: Secondary | ICD-10-CM | POA: Diagnosis not present

## 2023-12-09 DIAGNOSIS — Z9889 Other specified postprocedural states: Secondary | ICD-10-CM

## 2023-12-09 DIAGNOSIS — I10 Essential (primary) hypertension: Secondary | ICD-10-CM | POA: Diagnosis not present

## 2023-12-09 NOTE — Progress Notes (Signed)
 Cardiology Office Note   Date:  12/09/2023  ID:  Lauren Bartlett, DOB [birth year], MRN [id number] PCP: Marylynn Verneita CROME, MD  [facility] Providers Cardiologist:  Deatrice Cage, MD   History of Present Illness Lauren Bartlett is a 68 y.o. female with a hx of mitral valve prolapse, severe mitral regurgitation status post mitral valve repair in April 2018, hypertension, HFpEF, COPD, pulmonary arterial hypertension, congenital solitary kidney, hypothyroidism who presents for 1 year follow-up.   Patient has a history of mitral valve disease status postrepair due to mitral valve prolapse and severe MR in April 2018.  Catheterization prior to valvular repair revealed normal coronaries.  Echo 08/2016 showed normal LVEF, mild to moderate TR, PAH with past 54 mmHg.  Echo in November 2020 showed normal LVSF, mild mitral stenosis, RVSP 45.3 mmHg.   Echo April 2022 showed stable findings with a EF 55 to 60%, mildly elevated PASP, mild LAE, moderate MS was noted with a mean mitral valve gradient 8 mmHg, prosthetic angioplasty ring was present in the mitral position.  Aortic valve was not well visualized but appeared to be functioning well.  The patient was last seen 07/2022 and was stable from a cardiac perspective.  Recent echo showed EF 60 to 65%, no wall motion abnormality, moderate LVH, normal RV function, normally functioning mitral valve with a mean gradient of 7 mmHg, moderate TR.  Today, the patient reports congestion from allergies. She denies fever or chills. She had a steroid shot last week for the congestion. She denies chest pain. She has SOB when she overexerts herself. She is also stressed out for personal issues. She uses a cane and/or walker to get around. She has chronic lower leg swelling on the left side that is unchanged.  Studies Reviewed EKG Interpretation Date/Time:  Wednesday December 09 2023 10:29:58 EDT Ventricular Rate:  81 PR Interval:  156 QRS Duration:  80 QT  Interval:  386 QTC Calculation: 448 R Axis:   6  Text Interpretation: Normal sinus rhythm Normal ECG When compared with ECG of 28-Nov-2021 10:59, ST no longer elevated in Lateral leads Confirmed by Franchester, Lama Narayanan ([id number]) on 12/09/2023 10:38:15 AM    Echo 07/2022  1. Left ventricular ejection fraction, by estimation, is 60 to 65%. Left  ventricular ejection fraction by 2D MOD biplane is 55.1 %. Left  ventricular ejection fraction by PLAX is 67 %. The left ventricle has  normal function. The left ventricle has no  regional wall motion abnormalities. There is moderate left ventricular  hypertrophy. Left ventricular diastolic parameters are indeterminate.   2. Right ventricular systolic function is normal. The right ventricular  size is normal. There is mildly elevated pulmonary artery systolic  pressure. The estimated right ventricular systolic pressure is 41.5 mmHg.   3. Left atrial size was mildly dilated.   4. The mitral valve has been repaired/replaced. No evidence of mitral  valve regurgitation. Mild mitral stenosis. The mean mitral valve gradient  is 7.0 mmHg. There is a prosthetic annuloplasty ring present in the mitral  position. Procedure Date: 06/2016.   5. Tricuspid valve regurgitation is moderate.   6. The aortic valve is normal in structure. Aortic valve regurgitation is  not visualized. No aortic stenosis is present.   7. The inferior vena cava is normal in size with greater than 50%  respiratory variability, suggesting right atrial pressure of 3 mmHg.   Echo 06/2020  1. Left ventricular ejection fraction, by estimation, is 55 to 60%. The  left ventricle has normal function. Left ventricular endocardial border  not optimally defined to evaluate regional wall motion. Left ventricular  diastolic parameters are  indeterminate.   2. Right ventricular systolic function is normal. The right ventricular  size is normal. There is mildly elevated pulmonary artery systolic  pressure.    3. Left atrial size was mildly dilated.   4. The mitral valve was not well visualized. No evidence of mitral valve  regurgitation. Moderate mitral stenosis. The mean mitral valve gradient is  8.0 mmHg. There is a prosthetic annuloplasty ring present in the mitral  position. Procedure Date: 06/2016.   5. The aortic valve was not well visualized. Aortic valve regurgitation  is not visualized. No aortic stenosis is present.   6. The inferior vena cava is normal in size with <50% respiratory  variability, suggesting right atrial pressure of 8 mmHg.   Echo 01/2019 1. Left ventricular ejection fraction, by visual estimation, is 55 to  60%. The left ventricle has normal function. There is mildly increased  left ventricular hypertrophy.   2. Global right ventricle has normal systolic function.The right  ventricular size is normal. No increase in right ventricular wall  thickness.   3. The mitral valve has been repaired/replaced. No evidence of mitral  valve regurgitation. Mild mitral stenosis with mean gradient of 8 mm Hg.   4. The tricuspid valve is normal in structure. Tricuspid valve  regurgitation is trivial.   5. The aortic valve is normal in structure. Aortic valve regurgitation is  not visualized. No evidence of aortic valve sclerosis or stenosis.   6. Moderately elevated pulmonary artery systolic pressure.   7. The inferior vena cava is dilated in size with >50% respiratory  variability, suggesting right atrial pressure of 8 mmHg.   8. The tricuspid regurgitant velocity is 3.05 m/s, and with an assumed  right atrial pressure of 8 mmHg, the estimated right ventricular systolic  pressure is moderately elevated at 45.3 mmHg.   R/L heart cath 2018 The left ventricular systolic function is normal. LV end diastolic pressure is normal. The left ventricular ejection fraction is 55-65% by visual estimate. There is severe (4+) mitral regurgitation.   1. Normal coronary arteries.  Catheter-induced spasm in the ostial right coronary artery with no significant pressure dampening. This improved with catheter pullback. 2. Normal LV systolic function. 3. Severe mitral regurgitation by left ventricular angiography with dilated left atrium. 4. Right heart catheterization showed mildly elevated filling pressures, prominent V wave on pulmonary capillary wedge pressure, moderate pulmonary hypertension and high cardiac output. RA pressure: 6 mmHg, RV pressure: 53/7, PA pressure: 52/15 with a mean of 35 mmHg, pulmonary capillary wedge pressure: 17 mmHg. Cardiac output was 6.9 with a cardiac index of 3.5   Recommendations: Recommend evaluation for mitral valve repair. The patient needs to see pulmonary in order to make sure she is a candidate.     Physical Exam VS:  BP 130/76   Pulse 81   Ht 5' 3 (1.6 m)   Wt 249 lb 9.6 oz (113.2 kg)   SpO2 99%   BMI 44.21 kg/m        Wt Readings from Last 3 Encounters:  12/09/23 249 lb 9.6 oz (113.2 kg)  12/03/23 248 lb 3.2 oz (112.6 kg)  08/20/23 248 lb (112.5 kg)    GEN: Well nourished, well developed in no acute distress NECK: No JVD; No carotid bruits CARDIAC: RRR, no murmurs, rubs, gallops RESPIRATORY:  Clear to auscultation without rales,  wheezing or rhonchi  ABDOMEN: Soft, non-tender, non-distended EXTREMITIES:  No edema; No deformity   ASSESSMENT AND PLAN  Status post mitral valve repair and PFO closure for severe MR due to mitral valve prolapse Patient is stable from a cardiac perspective. Most recent echo showed gradient of 7 mmHg.  Repeat echo this year.    Hypertension Blood pressure is good.  Continue losartan  50 mg daily, Lopressor  25 mg daily.  Chronic diastolic heart failure She reports stable chronic lower leg edema.  On exam she does not have significant swelling. Patient takes Lasix  20 mg daily.  She is also on hydrochlorothiazide , which seems to have been started in the last year.  I will stop this given  Lasix .        Dispo: Follow-up in 1 year  Signed, Mayte Diers VEAR Fishman, PA-C

## 2023-12-09 NOTE — Patient Instructions (Signed)
 Medication Instructions:  Your physician recommends the following medication changes.  STOP TAKING: HYDROCHLOROTHIAZIDE    *If you need a refill on your cardiac medications before your next appointment, please call your pharmacy*  Lab Work: No labs ordered today  If you have labs (blood work) drawn today and your tests are completely normal, you will receive your results only by: MyChart Message (if you have MyChart) OR A paper copy in the mail If you have any lab test that is abnormal or we need to change your treatment, we will call you to review the results.  Testing/Procedures: Your physician has requested that you have an echocardiogram. Echocardiography is a painless test that uses sound waves to create images of your heart. It provides your doctor with information about the size and shape of your heart and how well your heart's chambers and valves are working.   You may receive an ultrasound enhancing agent through an IV if needed to better visualize your heart during the echo. This procedure takes approximately one hour.  There are no restrictions for this procedure.  This will take place at [address] [facility] [address], [place] [address]  Please note: We ask at that you not bring children with you during ultrasound (echo/ vascular) testing. Due to room size and safety concerns, children are not allowed in the ultrasound rooms during exams. Our front office staff cannot provide observation of children in our lobby area while testing is being conducted. An adult accompanying a patient to their appointment will only be allowed in the ultrasound room at the discretion of the ultrasound technician under special circumstances. We apologize for any inconvenience.   Follow-Up: At [facility], you and your health needs are our priority.  As part of our continuing mission to provide you with exceptional heart care, our providers are all part of one team.  This  team includes your primary Cardiologist (physician) and Advanced Practice Providers or APPs (Physician Assistants and Nurse Practitioners) who all work together to provide you with the care you need, when you need it.  Your next appointment:   1 year(s)  Provider:   You may see Deatrice Cage, MD or one of the following Advanced Practice Providers on your designated Care Team:   Lonni Meager, NP Lesley Maffucci, PA-C Bernardino Bring, PA-C Cadence Huntley, PA-C Tylene Lunch, NP Barnie Hila, NP    We recommend signing up for the patient portal called MyChart.  Sign up information is provided on this After Visit Summary.  MyChart is used to connect with patients for Virtual Visits (Telemedicine).  Patients are able to view lab/test results, encounter notes, upcoming appointments, etc.  Non-urgent messages can be sent to your provider as well.   To learn more about what you can do with MyChart, go to [email or web address].

## 2023-12-18 ENCOUNTER — Other Ambulatory Visit: Payer: Self-pay | Admitting: Internal Medicine

## 2024-01-06 DIAGNOSIS — J449 Chronic obstructive pulmonary disease, unspecified: Secondary | ICD-10-CM | POA: Diagnosis not present

## 2024-01-18 ENCOUNTER — Ambulatory Visit: Attending: Medical

## 2024-01-18 ENCOUNTER — Other Ambulatory Visit: Payer: Self-pay | Admitting: Medical

## 2024-01-18 DIAGNOSIS — Z9889 Other specified postprocedural states: Secondary | ICD-10-CM

## 2024-01-18 DIAGNOSIS — I10 Essential (primary) hypertension: Secondary | ICD-10-CM

## 2024-01-18 DIAGNOSIS — Z954 Presence of other heart-valve replacement: Secondary | ICD-10-CM

## 2024-01-18 DIAGNOSIS — I050 Rheumatic mitral stenosis: Secondary | ICD-10-CM

## 2024-01-18 DIAGNOSIS — I5032 Chronic diastolic (congestive) heart failure: Secondary | ICD-10-CM

## 2024-01-18 LAB — ECHOCARDIOGRAM COMPLETE
AR max vel: 2.36 cm2
AV Area VTI: 2.43 cm2
AV Area mean vel: 2.32 cm2
AV Mean grad: 3 mmHg
AV Peak grad: 5 mmHg
Ao pk vel: 1.12 m/s
Area-P 1/2: 3.74 cm2
MV VTI: 1.19 cm2
S' Lateral: 2.9 cm

## 2024-01-19 ENCOUNTER — Ambulatory Visit: Payer: Self-pay | Admitting: Medical

## 2024-01-27 ENCOUNTER — Other Ambulatory Visit: Payer: Self-pay | Admitting: Internal Medicine

## 2024-02-09 ENCOUNTER — Other Ambulatory Visit: Payer: Self-pay | Admitting: Internal Medicine

## 2024-02-09 ENCOUNTER — Other Ambulatory Visit: Payer: Self-pay | Admitting: Pulmonary Disease

## 2024-02-09 DIAGNOSIS — J4489 Other specified chronic obstructive pulmonary disease: Secondary | ICD-10-CM

## 2024-02-09 DIAGNOSIS — J4541 Moderate persistent asthma with (acute) exacerbation: Secondary | ICD-10-CM

## 2024-02-17 ENCOUNTER — Ambulatory Visit: Admitting: Pulmonary Disease

## 2024-02-23 ENCOUNTER — Ambulatory Visit
Admission: RE | Admit: 2024-02-23 | Discharge: 2024-02-23 | Disposition: A | Source: Ambulatory Visit | Attending: Internal Medicine | Admitting: Internal Medicine

## 2024-02-23 ENCOUNTER — Ambulatory Visit
Admission: RE | Admit: 2024-02-23 | Discharge: 2024-02-23 | Disposition: A | Discharge status: Home / Self Care | Attending: Internal Medicine | Admitting: Internal Medicine

## 2024-02-23 ENCOUNTER — Encounter: Payer: Self-pay | Admitting: Internal Medicine

## 2024-02-23 ENCOUNTER — Ambulatory Visit (INDEPENDENT_AMBULATORY_CARE_PROVIDER_SITE_OTHER): Admitting: Internal Medicine

## 2024-02-23 VITALS — BP 120/68 | HR 101 | Ht 63.0 in | Wt 240.8 lb

## 2024-02-23 DIAGNOSIS — E785 Hyperlipidemia, unspecified: Secondary | ICD-10-CM

## 2024-02-23 DIAGNOSIS — I10 Essential (primary) hypertension: Secondary | ICD-10-CM | POA: Diagnosis not present

## 2024-02-23 DIAGNOSIS — E034 Atrophy of thyroid (acquired): Secondary | ICD-10-CM

## 2024-02-23 DIAGNOSIS — J441 Chronic obstructive pulmonary disease with (acute) exacerbation: Secondary | ICD-10-CM | POA: Diagnosis not present

## 2024-02-23 DIAGNOSIS — Z95811 Presence of heart assist device: Secondary | ICD-10-CM

## 2024-02-23 DIAGNOSIS — R7301 Impaired fasting glucose: Secondary | ICD-10-CM | POA: Diagnosis not present

## 2024-02-23 DIAGNOSIS — Z0000 Encounter for general adult medical examination without abnormal findings: Secondary | ICD-10-CM

## 2024-02-23 DIAGNOSIS — J9621 Acute and chronic respiratory failure with hypoxia: Secondary | ICD-10-CM

## 2024-02-23 DIAGNOSIS — I272 Pulmonary hypertension, unspecified: Secondary | ICD-10-CM

## 2024-02-23 DIAGNOSIS — Z79899 Other long term (current) drug therapy: Secondary | ICD-10-CM

## 2024-02-23 DIAGNOSIS — Z1231 Encounter for screening mammogram for malignant neoplasm of breast: Secondary | ICD-10-CM

## 2024-02-23 MED ORDER — DOXYCYCLINE HYCLATE 100 MG PO TABS: 100.0000 mg | ORAL_TABLET | Freq: Two times a day (BID) | ORAL | 0 refills | Status: AC

## 2024-02-23 MED ORDER — METHYLPREDNISOLONE ACETATE 40 MG/ML IJ SUSP
40.0000 mg | Freq: Once | INTRAMUSCULAR | Status: AC
  Administered 2024-02-23: 40 mg via INTRAMUSCULAR

## 2024-02-23 MED ORDER — BUDESONIDE 0.25 MG/2ML IN SUSP: 0.2500 mg | Freq: Two times a day (BID) | RESPIRATORY_TRACT | 12 refills | Status: AC

## 2024-02-23 MED ORDER — PREDNISONE 10 MG PO TABS: ORAL_TABLET | ORAL | 0 refills | Status: AC

## 2024-02-23 MED ORDER — ALBUTEROL SULFATE (2.5 MG/3ML) 0.083% IN NEBU
2.5000 mg | INHALATION_SOLUTION | Freq: Once | RESPIRATORY_TRACT | Status: AC
  Administered 2024-02-23: 2.5 mg via RESPIRATORY_TRACT

## 2024-02-23 NOTE — Assessment & Plan Note (Signed)
 Symptoms started on Sunday with productive cough , malaise without fevers  ,  wheezing  and sinus congestion .   Chest x ray, by mr review, shows no infiltrate or acute changes.  Recommended doxy,  use of nebullizer for inhaled steroids and bronchodilator therapy

## 2024-02-23 NOTE — Assessment & Plan Note (Signed)
 S/p PFO closure  and mitral valve repair with excellent results,  she is asymptomatic

## 2024-02-23 NOTE — Progress Notes (Unsigned)
 Patient ID: Lauren Bartlett, female    DOB: [birth year]  Age: 68 y.o. MRN: [id number]  The patient is here for annual preventive examination and management of other chronic and acute problems.   The risk factors are reflected in the social history.   The roster of all physicians providing medical care to patient - is listed in the Snapshot section of the chart.   Activities of daily living:  The patient is 100% independent in all ADLs: dressing, toileting, feeding as well as independent mobility   Home safety : The patient has smoke detectors in the home. They wear seatbelts.  There are no unsecured firearms at home. There is no violence in the home.    There is no risks for hepatitis, STDs or HIV. There is no   history of blood transfusion. They have no travel history to infectious disease endemic areas of the world.   The patient has seen their dentist in the last six month. They have seen their eye doctor in the last year. The patinet  denies slight hearing difficulty with regard to whispered voices and some television programs.  They have deferred audiologic testing in the last year.  They do not  have excessive sun exposure. Discussed the need for sun protection: hats, long sleeves and use of sunscreen if there is significant sun exposure.    Diet: the importance of a healthy diet is discussed. They do have a healthy diet.   The benefits of regular aerobic exercise were discussed. The patient  exercises  3 to 5 days per week  for  60 minutes.    Depression screen: there are no signs or vegative symptoms of depression- irritability, change in appetite, anhedonia, sadness/tearfullness.   The following portions of the patient's history were reviewed and updated as appropriate: allergies, current medications, past family history, past medical history,  past surgical history, past social history  and problem list.   Visual acuity was not assessed per patient preference since the patient has regular  follow up with an  ophthalmologist. Hearing and body mass index were assessed and reviewed.    During the course of the visit the patient was educated and counseled about appropriate screening and preventive services including : fall prevention , diabetes screening, nutrition counseling, colorectal cancer screening, and recommended immunizations.    Chief Complaint:      Review of Symptoms  Patient denies headache, fevers, malaise, unintentional weight loss, skin rash, eye pain, sinus congestion and sinus pain, sore throat, dysphagia,  hemoptysis , cough, dyspnea, wheezing, chest pain, palpitations, orthopnea, edema, abdominal pain, nausea, melena, diarrhea, constipation, flank pain, dysuria, hematuria, urinary  Frequency, nocturia, numbness, tingling, seizures,  Focal weakness, Loss of consciousness,  Tremor, insomnia, depression, anxiety, and suicidal ideation.    Physical Exam:  BP 120/68   Pulse (!) 101   Ht 5' 3 (1.6 m)   Wt 240 lb 12.8 oz (109.2 kg)   SpO2 94%   BMI 42.66 kg/m    Physical Exam  Assessment and Plan: Primary hypertension  Hypothyroidism due to acquired atrophy of thyroid   Hyperlipidemia, unspecified hyperlipidemia type  Impaired fasting glucose  Long-term use of high-risk medication  Encounter for preventative adult health care examination  Encounter for screening mammogram for malignant neoplasm of breast    No follow-ups on file.  Verneita LITTIE Kettering, MD

## 2024-02-23 NOTE — Patient Instructions (Addendum)
 Start the prednisone  tomorrow:  6 tablets all at once on Day 1,  Day 2 and DAy 3 ,, Then taper by 1 tablet daily until gone ( 8 days total)  Use the budesonide  2 times daily for the next week, and the albuterol  every 6 hours  IN YOUR NEBULIZER  DOXYCYCLINE  TWICE DAILY WITH FOOD    Please get your chest x ray today at the [facility] [place] [facility]  to rule out pneumonia

## 2024-02-24 ENCOUNTER — Telehealth: Payer: Self-pay | Admitting: Internal Medicine

## 2024-02-24 NOTE — Telephone Encounter (Signed)
 Her chest x ray is normal by MY REVIEW .    No pneumonia . CONTINUE ONE ANTIBIOTIC AND PREDNISONE 

## 2024-02-24 NOTE — Telephone Encounter (Signed)
 Pt is aware and gave a verbal understanding.

## 2024-02-25 NOTE — Assessment & Plan Note (Addendum)
 Moderatele elevated pressures by Oct 2025 ECHO,  with right atrial pressure estimate of 15 mm .  She is asymptomatic at rest but has dyspnea with exertion . She is using supplemental oxygen  at night for documented desaturations.

## 2024-02-25 NOTE — Assessment & Plan Note (Signed)
 Ambulatory sats in the office today dropped reifly to 90% but not below. Steroids and abx added for current symptoms of bronchitis. Reminded to use supplemental oxygen 

## 2024-02-25 NOTE — Assessment & Plan Note (Signed)
 Normalized with e calcium  supplementation and calcitriol , dose adjust as needed stopping vitamin D  as level was 98 pm  May 2    Lab Results  Component Value Date   CALCIUM  8.5 01/22/2023   PHOS 4.2 06/11/2019

## 2024-02-29 ENCOUNTER — Ambulatory Visit: Payer: Self-pay | Admitting: Internal Medicine

## 2024-02-29 ENCOUNTER — Ambulatory Visit: Payer: 59 | Admitting: *Deleted

## 2024-02-29 VITALS — Ht 62.0 in | Wt 248.0 lb

## 2024-02-29 DIAGNOSIS — Z0000 Encounter for general adult medical examination without abnormal findings: Secondary | ICD-10-CM

## 2024-02-29 NOTE — Progress Notes (Cosign Needed Addendum)
 Chief Complaint  Patient presents with   Medicare Wellness    I connected with  Lauren Bartlett on 02/29/24 by a audio enabled telemedicine application and verified that I am speaking with the correct person using two identifiers.  Patient Location: Home  Provider Location: Home Office  Persons Participating in Visit: Patient.  I discussed the limitations of evaluation and management by telemedicine. The patient expressed understanding and agreed to proceed.   Vital Signs: Because this visit was a virtual/telehealth visit, some criteria may be missing or patient reported. Any vitals not documented were not able to be obtained and vitals that have been documented are patient reported.     Subjective:   Lauren Bartlett is a 68 y.o. female who presents for a Medicare Annual Wellness Visit.  Allergies (verified) Codeine, Levaquin  [levofloxacin ], Penicillins, Zithromax  [azithromycin ], and Prednisone    History: Past Medical History:  Diagnosis Date   Anxiety    Asthmatic bronchitis , chronic (HCC)    Bleeding from the nose    Chronic diastolic congestive heart failure (HCC)    a. 03/2016 Echo: EF 55-60%, Gr2 DD; b. 08/2016 Echo: EF 60-65%, nl diast fxn; c. 01/2019 Echo: EF 55-60%. Nl RV fxn.   Congenital absence of one kidney    COPD (chronic obstructive pulmonary disease) (HCC)    a. 02/2017 PFT: mod/sev obstructive airway dzs w/ significant bronchodilator response.   Depression    [facility]   Dyspnea    Emphysema/COPD    GERD (gastroesophageal reflux disease)    History of cardiac cath    a. 05/2016 Cath: nl cors.   History of sciatica    Hypertension    Hypothyroidism    Mitral Valve Prolapse & Severe mitral regurgitation s/p MVR    a. 03/2016 Echo: severe MVP involving the posterior leaflet, sev MR;  b. 06/2016 min invasive MV repair w/ triangular resection of flail segment (P2) of posterior leaflet, gore-tex neochord placement x 4, Sorin Memo 3D rin  Annulosplasty (26mm, catalog # [id number], ser # [id number]); c. 08/2016 Echo: MV area 2.06 cm^2 (pressure 1/2 time); d. 01/2019 Echo: Mild MS w/ mean grad .   Motion sickness    cars   Pneumonia    PONV (postoperative nausea and vomiting)    Post-op Afib    a. 06/2016 following MV repair-->short-course amio. Coumadin  d/c'd 2/2 epistaxis.   Pulmonary hypertension (HCC)    a. 08/2016 Echo: PASP ; b. 01/2019 Echo: RVSP 45.103mmHg.   Recurrent epistaxis 06/16/2020   S/P patent foramen ovale closure    a. 06/2016 @ time of MV Repair.   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   Past Surgical History:  Procedure Laterality Date   ABDOMINAL HYSTERECTOMY     BREAST BIOPSY Right 2011   [facility]< benign   CATARACT EXTRACTION W/PHACO Left 11/14/2015   Procedure: CATARACT EXTRACTION PHACO AND INTRAOCULAR LENS PLACEMENT (IOC);  Surgeon: Dene Etienne, MD;  Location: [facility];  Service: Ophthalmology;  Laterality: Left;  sleep apnea Toric   CATARACT EXTRACTION W/PHACO Right 12/19/2015   Procedure: CATARACT EXTRACTION PHACO AND INTRAOCULAR LENS PLACEMENT (IOC);  Surgeon: Dene Etienne, MD;  Location: [facility];  Service: Ophthalmology;  Laterality: Right;  ANXIETY GENEROUS IV SEDATION TORIC LEN   COMBINED HYSTERECTOMY ABDOMINAL W/ A&P REPAIR / OOPHORECTOMY  1996   benign tumor   INNER EAR SURGERY     bilateral   MITRAL VALVE REPAIR  Right 07/17/2016   Procedure: MINIMALLY INVASIVE MITRAL VALVE REPAIR (MVR) USING 26 SORIN MEMO 3D ANNULOPLASTY RING;  Surgeon: Sudie VEAR Laine, MD;  Location: [facility] OR;  Service: Open Heart Surgery;  Laterality: Right;   PATENT FORAMEN OVALE(PFO) CLOSURE N/A 07/17/2016   Procedure: PATENT FORAMEN OVALE (PFO) CLOSURE;  Surgeon: Sudie VEAR Laine, MD;  Location: [facility] OR;  Service: Open Heart Surgery;  Laterality: N/A;   RIGHT/LEFT HEART CATH AND CORONARY ANGIOGRAPHY Bilateral 06/16/2016   Procedure: Right/Left Heart Cath and  Coronary Angiography;  Surgeon: Deatrice DELENA Cage, MD;  Location: [facility];  Service: Cardiovascular;  Laterality: Bilateral;   TEE WITHOUT CARDIOVERSION N/A 05/19/2016   Procedure: TRANSESOPHAGEAL ECHOCARDIOGRAM (TEE);  Surgeon: Deatrice DELENA Cage, MD;  Location: [facility];  Service: Cardiovascular;  Laterality: N/A;   TEE WITHOUT CARDIOVERSION N/A 06/09/2016   Procedure: TRANSESOPHAGEAL ECHOCARDIOGRAM (TEE);  Surgeon: Deatrice DELENA Cage, MD;  Location: [facility];  Service: Cardiovascular;  Laterality: N/A;   TEE WITHOUT CARDIOVERSION N/A 06/16/2016   Procedure: TRANSESOPHAGEAL ECHOCARDIOGRAM (TEE);  Surgeon: Deatrice DELENA Cage, MD;  Location: [facility];  Service: Cardiovascular;  Laterality: N/A;   TEE WITHOUT CARDIOVERSION N/A 07/17/2016   Procedure: TRANSESOPHAGEAL ECHOCARDIOGRAM (TEE);  Surgeon: Sudie VEAR Laine, MD;  Location: [facility] OR;  Service: Open Heart Surgery;  Laterality: N/A;   TONSILLECTOMY     Family History  Problem Relation Age of Onset   Multiple sclerosis Mother    Hypertension Mother    Coronary artery disease Father    Heart disease Father    Hypertension Father    Heart disease Brother    Cancer Brother    Breast cancer Paternal Aunt    Social History   Occupational History   Occupation: cna    Comment: part time  Tobacco Use   Smoking status: Never   Smokeless tobacco: Never  Vaping Use   Vaping status: Never Used  Substance and Sexual Activity   Alcohol  use: No   Drug use: No   Sexual activity: Not Currently   Tobacco Counseling Counseling given: Not Answered  SDOH Screenings   Food Insecurity: No Food Insecurity (02/29/2024)  Housing: Low Risk  (02/29/2024)  Transportation Needs: No Transportation Needs (02/29/2024)  Utilities: Not At Risk (02/29/2024)  Alcohol  Screen: Low Risk  (02/29/2024)  Depression (PHQ2-9): Low Risk  (02/29/2024)  Financial Resource Strain: Low Risk  (02/29/2024)  Physical Activity: Insufficiently Active (02/29/2024)  Social  Connections: Moderately Isolated (02/29/2024)  Stress: No Stress Concern Present (02/29/2024)  Tobacco Use: Low Risk  (02/29/2024)  Health Literacy: Adequate Health Literacy (02/29/2024)   See flowsheets for full screening details  Depression Screen PHQ 2 & 9 Depression Scale- Over the past 2 weeks, how often have you been bothered by any of the following problems? Little interest or pleasure in doing things: 0 Feeling down, depressed, or hopeless (PHQ Adolescent also includes...irritable): 0 PHQ-2 Total Score: 0 Trouble falling or staying asleep, or sleeping too much: 0 Feeling tired or having little energy: 3 Poor appetite or overeating (PHQ Adolescent also includes...weight loss): 0 Feeling bad about yourself - or that you are a failure or have let yourself or your family down: 0 Trouble concentrating on things, such as reading the newspaper or watching television (PHQ Adolescent also includes...like school work): 0 Moving or speaking so slowly that other people could have noticed. Or the opposite - being so fidgety or restless that you have been moving around a lot more than usual: 0 Thoughts that you  would be better off dead, or of hurting yourself in some way: 0 PHQ-9 Total Score: 3 If you checked off any problems, how difficult have these problems made it for you to do your work, take care of things at home, or get along with other people?: Somewhat difficult     Goals Addressed             This Visit's Progress    Patient Stated       Wants to lose weight and be more active       Visit info / Clinical Intake: Medicare Wellness Visit Type:: Subsequent Annual Wellness Visit Persons participating in visit:: patient Medicare Wellness Visit Mode:: Telephone If telephone:: video declined Because this visit was a virtual/telehealth visit:: unable to obtan vitals due to lack of equipment If Telephone or Video please confirm:: I connected with the patient using audio enabled  telemedicine application and verified that I am speaking with the correct person using two identifiers; I discussed the limitations of evaluation and management by telemedicine; The patient expressed understanding and agreed to proceed Patient Location:: Home Provider Location:: Office/Home Information given by:: patient Interpreter Needed?: No Pre-visit prep was completed: yes AWV questionnaire completed by patient prior to visit?: no Living arrangements:: (!) lives alone Patient's Overall Health Status Rating: (!) fair Typical amount of pain: some Does pain affect daily life?: (!) yes Are you currently prescribed opioids?: (!) yes  Dietary Habits and Nutritional Risks How many meals a day?: 3 Eats fruit and vegetables daily?: yes Most meals are obtained by: having others provide food In the last 2 weeks, have you had any of the following?: none Diabetic:: no  Functional Status Activities of Daily Living (to include ambulation/medication): (!) Needs Assist Feeding: Independent Dressing/Grooming: Needs assistance Bathing: Needs assistance Toileting: Independent Transfer: Independent with device- listed below Ambulation: Independent with device- listed below Home Assistive Devices/Equipment: Walker (specify Type) (cane) Medication Administration: Independent Home Management: Needs assistance (comment) Manage your own finances?: yes Primary transportation is: driving Concerns about vision?: no *vision screening is required for WTM* Concerns about hearing?: (!) yes Uses hearing aids?: (!) yes Hear whispered voice?: -- (televisit)  Fall Screening Falls in the past year?: 0 Number of falls in past year: 0 Was there an injury with Fall?: 0 Fall Risk Category Calculator: 0 Patient Fall Risk Level: Low Fall Risk  Fall Risk Patient at Risk for Falls Due to: No Fall Risks Fall risk Follow up: Falls evaluation completed; Falls prevention discussed  Home and Transportation  Safety: All rugs have non-skid backing?: N/A, no rugs All stairs or steps have railings?: (!) no Grab bars in the bathtub or shower?: yes Have non-skid surface in bathtub or shower?: yes Good home lighting?: yes Regular seat belt use?: yes Hospital stays in the last year:: no  Cognitive Assessment Difficulty concentrating, remembering, or making decisions? : no Will 6CIT or Mini Cog be Completed: yes What year is it?: 0 points What month is it?: 0 points Give patient an address phrase to remember (5 components): [address] [place] About what time is it?: 0 points Count backwards from 20 to 1: 0 points Say the months of the year in reverse: 0 points Repeat the address phrase from earlier: 2 points 6 CIT Score: 2 points  Advance Directives (For Healthcare) Does Patient Have a Medical Advance Directive?: No Would patient like information on creating a medical advance directive?: No - Patient declined  Reviewed/Updated  Reviewed/Updated: Reviewed All (Medical,  Surgical, Family, Medications, Allergies, Care Teams, Patient Goals)        Objective:    Today's Vitals   02/29/24 0928  Weight: 248 lb (112.5 kg)  Height: 5' 2 (1.575 m)   Body mass index is 45.36 kg/m.  Current Medications (verified) Outpatient Encounter Medications as of 02/29/2024  Medication Sig   acetaminophen  (TYLENOL ) 500 MG tablet Take 500 mg by mouth 2 (two) times daily as needed for moderate pain or headache.   albuterol  (PROVENTIL ) (2.5 MG/3ML) 0.083% nebulizer solution INHALE THE CONTENTS OF 1 VIAL  VIA NEBULIZER EVERY 6 HOURS AS  NEEDED FOR WHEEZING OR FOR  SHORTNESS OF BREATH   albuterol  (VENTOLIN  HFA) 108 (90 Base) MCG/ACT inhaler INHALE 2 INHALATIONS BY MOUTH  INTO THE LUNGS EVERY 6 HOURS AS  NEEDED FOR WHEEZING OR SHORTNESS OF BREATH (USE WITH SPACER)   aspirin  EC 81 MG EC tablet Take 1 tablet (81 mg total) by mouth daily.   budesonide  (PULMICORT ) 0.25 MG/2ML nebulizer solution Take 2 mLs  (0.25 mg total) by nebulization in the morning and at bedtime.   calcitRIOL  (ROCALTROL ) 0.25 MCG capsule TAKE 1 CAPSULE BY MOUTH DAILY   Calcium  Carb-Cholecalciferol 600-800 MG-UNIT TABS Take 1 tablet by mouth in the morning, at noon, and at bedtime.    docusate sodium  (COLACE) 100 MG capsule Take 100 mg by mouth daily.   doxycycline  (VIBRA -TABS) 100 MG tablet Take 1 tablet (100 mg total) by mouth 2 (two) times daily.   famotidine  (PEPCID  AC) 10 MG chewable tablet Chew 20 mg by mouth daily as needed for heartburn.    ferrous sulfate  325 (65 FE) MG tablet Take 325 mg by mouth daily with breakfast.   furosemide  (LASIX ) 20 MG tablet TAKE 1 TABLET BY MOUTH DAILY .  MAY TAKE AN ADDITIONAL TABLET AS NEEDED FOR WEIGHT GAIN OF 2 LBS  OVERNIGHT OR 5 LBS IN 1 WEEK   levothyroxine  (SYNTHROID ) 125 MCG tablet TAKE 1 TABLET BY MOUTH DAILY   lidocaine  (LIDODERM ) 5 % Place 1 patch onto the skin every 12 (twelve) hours. Remove & Discard patch within 12 hours or as directed by MD (Patient taking differently: Place 1 patch onto the skin as needed. Remove & Discard patch within 12 hours or as directed by MD)   loratadine  (CLARITIN ) 10 MG tablet Take 10 mg by mouth daily.   losartan  (COZAAR ) 50 MG tablet TAKE 1 TABLET BY MOUTH DAILY   metoprolol  tartrate (LOPRESSOR ) 25 MG tablet    OXYGEN  Inhale 2 L into the lungs at bedtime as needed (at bedtime and as needed). (Patient taking differently: Inhale 2 L into the lungs at bedtime.)   oxymetazoline  (AFRIN) 0.05 % nasal spray Place 1 spray into both nostrils 2 (two) times daily. As needed for nose bleed   polyethylene glycol powder (GLYCOLAX /MIRALAX ) powder Take 17 g by mouth 2 (two) times daily as needed.   traMADol  (ULTRAM ) 50 MG tablet Take 1-2 tablets (50-100 mg total) by mouth every 4 (four) hours as needed for moderate pain.   TRELEGY ELLIPTA  200-62.5-25 MCG/ACT AEPB USE 1 INHALATION BY MOUTH ONCE  DAILY AT THE SAME TIME EACH DAY   vitamin B-12 (CYANOCOBALAMIN ) 1000  MCG tablet Take 1 tablet (1,000 mcg total) by mouth daily.   predniSONE  (DELTASONE ) 10 MG tablet 6 tablets daily for 3 days, then reduce by 1 tablet daily until gone (Patient not taking: Reported on 02/29/2024)   No facility-administered encounter medications on file as of 02/29/2024.   Hearing/Vision  screen Hearing Screening - Comments:: Wears aids Vision Screening - Comments:: Readers, [facility], up to date Immunizations and Health Maintenance Health Maintenance  Topic Date Due   Zoster Vaccines- Shingrix (1 of 2) Never done   Pneumococcal Vaccine: 50+ Years (3 of 3 - PCV20 or PCV21) 04/12/2024   COVID-19 Vaccine (3 - Pfizer risk series) 03/10/2024 (Originally 10/05/2019)   Influenza Vaccine  06/28/2024 (Originally 10/30/2023)   Medicare Annual Wellness (AWV)  02/28/2025   Mammogram  06/17/2025   Fecal DNA (Cologuard)  03/15/2026   DTaP/Tdap/Td (3 - Td or Tdap) 05/24/2031   Bone Density Scan  Completed   Hepatitis C Screening  Completed   Meningococcal B Vaccine  Aged Out        Assessment/Plan:  This is a routine wellness examination for Kimberlyn.  Patient Care Team: Marylynn Verneita CROME, MD as PCP - General (Internal Medicine) Darron Deatrice LABOR, MD as PCP - Cardiology (Cardiology) Tamea Dedra CROME, MD as Consulting Physician (Pulmonary Disease)  I have personally reviewed and noted the following in the patient's chart:   Medical and social history Use of alcohol , tobacco or illicit drugs  Current medications and supplements including opioid prescriptions. Functional ability and status Nutritional status Physical activity Advanced directives List of other physicians Hospitalizations, surgeries, and ER visits in previous 12 months Vitals Screenings to include cognitive, depression, and falls Referrals and appointments  No orders of the defined types were placed in this encounter.  In addition, I have reviewed and discussed with patient certain preventive protocols, quality  metrics, and best practice recommendations. A written personalized care plan for preventive services as well as general preventive health recommendations were provided to patient.   Angeline Fredericks, LPN   [id number]   Return in 1 year (on 02/28/2025).  After Visit Summary: (Declined) Due to this being a telephonic visit, with patients personalized plan was offered to patient but patient Declined AVS at this time   Nurse Notes: Patient declines flu and shingles vaccines at this time. Patient needs pneumonia vaccine at next office visit.

## 2024-02-29 NOTE — Patient Instructions (Signed)
 [name],  Thank you for taking the time for your Medicare Wellness Visit. I appreciate your continued commitment to your health goals. Please review the care plan we discussed, and feel free to reach out if I can assist you further.  Please note that Annual Wellness Visits do not include a physical exam. Some assessments may be limited, especially if the visit was conducted virtually. If needed, we may recommend an in-person follow-up with your provider.  Ongoing Care Seeing your primary care provider every 3 to 6 months helps us  monitor your health and provide consistent, personalized care.  Consider updating your flu, shingles and pneumonia vaccines.   Managing Pain Without Opioids Opioids are strong medicines used to treat moderate to severe pain. For some people, especially those who have long-term (chronic) pain, opioids may not be the best choice for pain management due to: Side effects like nausea, constipation, and sleepiness. The risk of addiction (opioid use disorder). The longer you take opioids, the greater your risk of addiction. Pain that lasts for more than 3 months is called chronic pain. Managing chronic pain usually requires more than one approach and is often provided by a team of health care providers working together (multidisciplinary approach). Pain management may be done at a pain management center or pain clinic. How to manage pain without the use of opioids Use non-opioid medicines Non-opioid medicines for pain may include: Over-the-counter or prescription non-steroidal anti-inflammatory drugs (NSAIDs). These may be the first medicines used for pain. They work well for muscle and bone pain, and they reduce swelling. Acetaminophen . This over-the-counter medicine may work well for milder pain but not swelling. Antidepressants. These may be used to treat chronic pain. A certain type of antidepressant (tricyclics) is often used. These medicines are given in lower doses for  pain than when used for depression. Anticonvulsants. These are usually used to treat seizures but may also reduce nerve (neuropathic) pain. Muscle relaxants. These relieve pain caused by sudden muscle tightening (spasms). You may also use a pain medicine that is applied to the skin as a patch, cream, or gel (topical analgesic), such as a numbing medicine. These may cause fewer side effects than medicines taken by mouth. Do certain therapies as directed Some therapies can help with pain management. They include: Physical therapy. You will do exercises to gain strength and flexibility. A physical therapist may teach you exercises to move and stretch parts of your body that are weak, stiff, or painful. You can learn these exercises at physical therapy visits and practice them at home. Physical therapy may also involve: Massage. Heat wraps or applying heat or cold to affected areas. Electrical signals that interrupt pain signals (transcutaneous electrical nerve stimulation, TENS). Weak lasers that reduce pain and swelling (low-level laser therapy). Signals from your body that help you learn to regulate pain (biofeedback). Occupational therapy. This helps you to learn ways to function at home and work with less pain. Recreational therapy. This involves trying new activities or hobbies, such as a physical activity or drawing. Mental health therapy, including: Cognitive behavioral therapy (CBT). This helps you learn coping skills for dealing with pain. Acceptance and commitment therapy (ACT) to change the way you think and react to pain. Relaxation therapies, including muscle relaxation exercises and mindfulness-based stress reduction. Pain management counseling. This may be individual, family, or group counseling.  Receive medical treatments Medical treatments for pain management include: Nerve block injections. These may include a pain blocker and anti-inflammatory medicines. You may have  injections: Near the spine to relieve chronic back or neck pain. Into joints to relieve back or joint pain. Into nerve areas that supply a painful area to relieve body pain. Into muscles (trigger point injections) to relieve some painful muscle conditions. A medical device placed near your spine to help block pain signals and relieve nerve pain or chronic back pain (spinal cord stimulation device). Acupuncture. Follow these instructions at home Medicines Take over-the-counter and prescription medicines only as told by your health care provider. If you are taking pain medicine, ask your health care providers about possible side effects to watch out for. Do not drive or use heavy machinery while taking prescription opioid pain medicine. Lifestyle  Do not use drugs or alcohol  to reduce pain. If you drink alcohol , limit how much you have to: 0-1 drink a day for women who are not pregnant. 0-2 drinks a day for men. Know how much alcohol  is in a drink. In the [place] one drink equals one 12 oz bottle of beer (355 mL), one 5 oz glass of wine (148 mL), or one 1 oz glass of hard liquor (44 mL). Do not use any products that contain nicotine or tobacco. These products include cigarettes, chewing tobacco, and vaping devices, such as e-cigarettes. If you need help quitting, ask your health care provider. Eat a healthy diet and maintain a healthy weight. Poor diet and excess weight may make pain worse. Eat foods that are high in fiber. These include fresh fruits and vegetables, whole grains, and beans. Limit foods that are high in fat and processed sugars, such as fried and sweet foods. Exercise regularly. Exercise lowers stress and may help relieve pain. Ask your health care provider what activities and exercises are safe for you. If your health care provider approves, join an exercise class that combines movement and stress reduction. Examples include yoga and tai chi. Get enough sleep. Lack of sleep may  make pain worse. Lower stress as much as possible. Practice stress reduction techniques as told by your therapist. General instructions Work with all your pain management providers to find the treatments that work best for you. You are an important member of your pain management team. There are many things you can do to reduce pain on your own. Consider joining an online or in-person support group for people who have chronic pain. Keep all follow-up visits. This is important. Where to find more information You can find more information about managing pain without opioids from: American Academy of Pain Medicine: painmed.org Institute for Chronic Pain: instituteforchronicpain.org American Chronic Pain Association: theacpa.org Contact a health care provider if: You have side effects from pain medicine. Your pain gets worse or does not get better with treatments or home therapy. You are struggling with anxiety or depression. Summary Many types of pain can be managed without opioids. Chronic pain may respond better to pain management without opioids. Pain is best managed when you and a team of health care providers work together. Pain management without opioids may include non-opioid medicines, medical treatments, physical therapy, mental health therapy, and lifestyle changes. Tell your health care providers if your pain gets worse or is not being managed well enough. This information is not intended to replace advice given to you by your health care provider. Make sure you discuss any questions you have with your health care provider. Document Revised: 06/27/2020 Document Reviewed: 06/27/2020 [employer] Patient Education  2024 [employer]  Referrals If a referral was made during today's visit and  you haven't received any updates within two weeks, please contact the referred provider directly to check on the status.  Recommended Screenings:  Health Maintenance  Topic Date Due   Zoster  (Shingles) Vaccine (1 of 2) Never done   Pneumococcal Vaccine for age over 90 (3 of 3 - PCV20 or PCV21) 04/12/2024   COVID-19 Vaccine (3 - Pfizer risk series) 03/10/2024*   Flu Shot  06/28/2024*   Medicare Annual Wellness Visit  02/28/2025   Breast Cancer Screening  06/17/2025   Cologuard (Stool DNA test)  03/15/2026   DTaP/Tdap/Td vaccine (3 - Td or Tdap) 05/24/2031   Osteoporosis screening with Bone Density Scan  Completed   Hepatitis C Screening  Completed   Meningitis B Vaccine  Aged Out  *Topic was postponed. The date shown is not the original due date.       02/29/2024    9:33 AM  Advanced Directives  Does Patient Have a Medical Advance Directive? No  Would patient like information on creating a medical advance directive? No - Patient declined    Vision: Annual vision screenings are recommended for early detection of glaucoma, cataracts, and diabetic retinopathy. These exams can also reveal signs of chronic conditions such as diabetes and high blood pressure.  Dental: Annual dental screenings help detect early signs of oral cancer, gum disease, and other conditions linked to overall health, including heart disease and diabetes.  Please see the attached documents for additional preventive care recommendations.

## 2024-03-21 ENCOUNTER — Ambulatory Visit: Admitting: Nurse Practitioner

## 2024-03-23 ENCOUNTER — Ambulatory Visit
Admission: EM | Admit: 2024-03-23 | Discharge: 2024-03-23 | Disposition: A | Discharge status: Home / Self Care | Attending: Physician Assistant | Admitting: Physician Assistant

## 2024-03-23 DIAGNOSIS — B029 Zoster without complications: Secondary | ICD-10-CM | POA: Diagnosis not present

## 2024-03-23 MED ORDER — VALACYCLOVIR HCL 1 G PO TABS: 1000.0000 mg | ORAL_TABLET | Freq: Three times a day (TID) | ORAL | 0 refills | Status: AC

## 2024-03-23 NOTE — ED Provider Notes (Signed)
 " [facility]    CSN: [id number] Arrival date & time: 03/23/24  1045      History   Chief Complaint Chief Complaint  Patient presents with   Herpes Zoster    HPI Lauren Bartlett is a 68 y.o. female with history of anxiety, hypertension, congenital absence of 1 kidney, hypothyroidism, pulmonary hypertension, tracheomalacia, CHF, obesity, atrial fibrillation, COPD, and heart valve disease.  Today, she is presenting for painful vesicular rash of the right jaw, right ear, right scalp, and right upper chest since yesterday.  She says pain is manageable with Tylenol .  She is concerned for possible shingles.  She denies ever having shingles before.  She has not had any issues with hearing or pain inside the ear.  No other areas of rash on face and does not affect eye, mouth.  No chest pain or shortness of breath.  HPI  Past Medical History:  Diagnosis Date   Anxiety    Asthmatic bronchitis , chronic (HCC)    Bleeding from the nose    Chronic diastolic congestive heart failure (HCC)    a. 03/2016 Echo: EF 55-60%, Gr2 DD; b. 08/2016 Echo: EF 60-65%, nl diast fxn; c. 01/2019 Echo: EF 55-60%. Nl RV fxn.   Congenital absence of one kidney    COPD (chronic obstructive pulmonary disease) (HCC)    a. 02/2017 PFT: mod/sev obstructive airway dzs w/ significant bronchodilator response.   Dep[facility]    [facility]   Dyspnea    Emphysema/COPD    GERD (gastroesophageal reflux disease)    History of cardiac cath    a. 05/2016 Cath: nl cors.   History of sciatica    Hypertension    Hypothyroidism    Mitral Valve Prolapse & Severe mitral regurgitation s/p MVR    a. 03/2016 Echo: severe MVP involving the posterior leaflet, sev MR;  b. 06/2016 min invasive MV repair w/ triangular resection of flail segment (P2) of posterior leaflet, gore-tex neochord placement x 4, Sorin Memo 3D rin Annulosplasty (26mm, catalog # [id number], ser # [id number]); c. 08/2016 Echo: MV area 2.06 cm^2 (pressure 1/2  time); d. 01/2019 Echo: Mild MS w/ mean grad .   Motion sickness    cars   Pneumonia    PONV (postoperative nausea and vomiting)    Post-op Afib    a. 06/2016 following MV repair-->short-course amio. Coumadin  d/c'd 2/2 epistaxis.   Pulmonary hypertension (HCC)    a. 08/2016 Echo: PASP ; b. 01/2019 Echo: RVSP 45.77mmHg.   Recurrent epistaxis 06/16/2020   S/P patent foramen ovale closure    a. 06/2016 @ time of MV Repair.   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    Patient Active Problem List   Diagnosis Date Noted   Presence of heart assist device (HCC) 02/23/2024   Chronic asthmatic bronchitis with acute exacerbation (HCC) 02/23/2024   Myalgia due to statin 08/19/2022   Osteopenia determined by x-ray 08/28/2021   Unsteady gait when walking 04/29/2021   Aortic atherosclerosis 09/16/2020   PAF (paroxysmal atrial fibrillation) (HCC) 12/18/2018   Nocturnal hypoxemia due to pulmonary hypertension (HCC) 02/06/2018   Encounter for therapeutic drug monitoring 08/13/2016   S/P minimally invasive mitral valve repair 07/17/2016   S/P patent foramen ovale closure 07/17/2016   Chronic diastolic congestive heart failure (HCC)    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   Acute on chronic respiratory failure with hypoxia (HCC)    Vitamin D  deficiency 01/11/2015  Allergic rhinitis 07/09/2014   S/P hysterectomy with oophorectomy 09/22/2013   Pulmonary hypertension (HCC)    Hypocalcemia 09/10/2012   Hypothyroidism 05/04/2012   Morbid obesity (HCC) 08/04/2011   Hypertension    Congenital absence of one kidney    History of sciatica     Past Surgical History:  Procedure Laterality Date   ABDOMINAL HYSTERECTOMY     BREAST BIOPSY Right 2011   [facility]< benign   CATARACT EXTRACTION W/PHACO Left 11/14/2015   Procedure: CATARACT EXTRACTION PHACO AND INTRAOCULAR LENS PLACEMENT (IOC);  Surgeon: Dene Etienne, MD;  Location: [facility];  Service: Ophthalmology;   Laterality: Left;  sleep apnea Toric   CATARACT EXTRACTION W/PHACO Right 12/19/2015   Procedure: CATARACT EXTRACTION PHACO AND INTRAOCULAR LENS PLACEMENT (IOC);  Surgeon: Dene Etienne, MD;  Location: [facility];  Service: Ophthalmology;  Laterality: Right;  ANXIETY GENEROUS IV SEDATION TORIC LEN   COMBINED HYSTERECTOMY ABDOMINAL W/ A&P REPAIR / OOPHORECTOMY  1996   benign tumor   INNER EAR SURGERY     bilateral   MITRAL VALVE REPAIR Right 07/17/2016   Procedure: MINIMALLY INVASIVE MITRAL VALVE REPAIR (MVR) USING 26 SORIN MEMO 3D ANNULOPLASTY RING;  Surgeon: Sudie VEAR Laine, MD;  Location: [facility] OR;  Service: Open Heart Surgery;  Laterality: Right;   PATENT FORAMEN OVALE(PFO) CLOSURE N/A 07/17/2016   Procedure: PATENT FORAMEN OVALE (PFO) CLOSURE;  Surgeon: Sudie VEAR Laine, MD;  Location: [facility] OR;  Service: Open Heart Surgery;  Laterality: N/A;   RIGHT/LEFT HEART CATH AND CORONARY ANGIOGRAPHY Bilateral 06/16/2016   Procedure: Right/Left Heart Cath and Coronary Angiography;  Surgeon: Deatrice DELENA Cage, MD;  Location: [facility];  Service: Cardiovascular;  Laterality: Bilateral;   TEE WITHOUT CARDIOVERSION N/A 05/19/2016   Procedure: TRANSESOPHAGEAL ECHOCARDIOGRAM (TEE);  Surgeon: Deatrice DELENA Cage, MD;  Location: [facility] ORS;  Service: Cardiovascular;  Laterality: N/A;   TEE WITHOUT CARDIOVERSION N/A 06/09/2016   Procedure: TRANSESOPHAGEAL ECHOCARDIOGRAM (TEE);  Surgeon: Deatrice DELENA Cage, MD;  Location: [facility] OR[facility];  Service: Cardiovascular;  Laterality: N/A;   TEE WITHOUT CARDIOVERSION N/A 06/16/2016   Procedure: TRANSESOPHAGEAL ECHOCARDIOGRAM (TEE);  Surgeon: Deatrice DELENA Cage, MD;  Location: [facility] OR[facility];  Service: Cardiovascular;  Laterality: N/A;   TEE WITHOUT CARDIOVERSION N/A 07/17/2016   Procedure: TRANSESOPHAGEAL ECHOCARDIOGRAM (TEE);  Surgeon: Sudie VEAR Laine, MD;  Location: [facility] OR;  Service: Open Heart Surgery;  Laterality: N/A;   TONSILLECTOMY      OB History   No obstetric  history on file.      Home Medications    Prior to Admission medications  Medication Sig Start Date End Date Taking? Authorizing Provider  valACYclovir  (VALTREX ) 1000 MG tablet Take 1 tablet (1,000 mg total) by mouth 3 (three) times daily. 03/23/24  Yes Arvis Huxley B, PA-C  acetaminophen  (TYLENOL ) 500 MG tablet Take 500 mg by mouth 2 (two) times daily as needed for moderate pain or headache.    [provider]  albuterol  (PROVENTIL ) (2.5 MG/3ML) 0.083% nebulizer solution INHALE THE CONTENTS OF 1 VIAL  VIA NEBULIZER EVERY 6 HOURS AS  NEEDED FOR WHEEZING OR FOR  SHORTNESS OF BREATH 02/10/24   Tamea Dedra CROME, MD  albuterol  (VENTOLIN  HFA) 108 (90 Base) MCG/ACT inhaler INHALE 2 INHALATIONS BY MOUTH  INTO THE LUNGS EVERY 6 HOURS AS  NEEDED FOR WHEEZING OR SHORTNESS OF BREATH (USE WITH SPACER) 02/10/24   Tamea Dedra CROME, MD  aspirin  EC 81 MG EC tablet Take 1 tablet (81 mg total) by mouth daily. 07/23/16  Barrett, Erin R, PA-C  budesonide  (PULMICORT ) 0.25 MG/2ML nebulizer solution Take 2 mLs (0.25 mg total) by nebulization in the morning and at bedtime. 02/23/24   Marylynn Verneita CROME, MD  calcitRIOL  (ROCALTROL ) 0.25 MCG capsule TAKE 1 CAPSULE BY MOUTH DAILY 06/09/23   Marylynn Verneita CROME, MD  Calcium  Carb-Cholecalciferol 600-800 MG-UNIT TABS Take 1 tablet by mouth in the morning, at noon, and at bedtime.     [provider]  docusate sodium  (COLACE) 100 MG capsule Take 100 mg by mouth daily.    [provider]  doxycycline  (VIBRA -TABS) 100 MG tablet Take 1 tablet (100 mg total) by mouth 2 (two) times daily. 02/23/24   Marylynn Verneita CROME, MD  famotidine  (PEPCID  AC) 10 MG chewable tablet Chew 20 mg by mouth daily as needed for heartburn.     [provider]  ferrous sulfate  325 (65 FE) MG tablet Take 325 mg by mouth daily with breakfast.    [provider]  furosemide  (LASIX ) 20 MG tablet TAKE 1 TABLET BY MOUTH DAILY .  MAY TAKE AN ADDITIONAL TABLET AS NEEDED FOR  WEIGHT GAIN OF 2 LBS  OVERNIGHT OR 5 LBS IN 1 WEEK 08/25/23   Marylynn Verneita CROME, MD  levothyroxine  (SYNTHROID ) 125 MCG tablet TAKE 1 TABLET BY MOUTH DAILY 01/28/24   Marylynn Verneita CROME, MD  lidocaine  (LIDODERM ) 5 % Place 1 patch onto the skin every 12 (twelve) hours. Remove & Discard patch within 12 hours or as directed by MD Patient taking differently: Place 1 patch onto the skin as needed. Remove & Discard patch within 12 hours or as directed by MD 06/18/21   Viviann Pastor, MD  loratadine  (CLARITIN ) 10 MG tablet Take 10 mg by mouth daily.    [provider]  losartan  (COZAAR ) 50 MG tablet TAKE 1 TABLET BY MOUTH DAILY 11/16/23   Tullo, Teresa L, MD  metoprolol  tartrate (LOPRESSOR ) 25 MG tablet  10/07/16   [provider]  OXYGEN  Inhale 2 L into the lungs at bedtime as needed (at bedtime and as needed). Patient taking differently: Inhale 2 L into the lungs at bedtime.    [provider]  oxymetazoline  (AFRIN) 0.05 % nasal spray Place 1 spray into both nostrils 2 (two) times daily. As needed for nose bleed    [provider]  polyethylene glycol powder (GLYCOLAX /MIRALAX ) powder Take 17 g by mouth 2 (two) times daily as needed. 08/03/15   Marylynn Verneita CROME, MD  predniSONE  (DELTASONE ) 10 MG tablet 6 tablets daily for 3 days, then reduce by 1 tablet daily until gone Patient not taking: Reported on 02/29/2024 02/23/24   Marylynn Verneita CROME, MD  traMADol  (ULTRAM ) 50 MG tablet Take 1-2 tablets (50-100 mg total) by mouth every 4 (four) hours as needed for moderate pain. 08/21/16   Marylynn Verneita CROME, MD  TRELEGY ELLIPTA  200-62.5-25 MCG/ACT AEPB USE 1 INHALATION BY MOUTH ONCE  DAILY AT THE SAME TIME EACH DAY 11/06/23   Tamea Dedra CROME, MD  vitamin B-12 (CYANOCOBALAMIN ) 1000 MCG tablet Take 1 tablet (1,000 mcg total) by mouth daily. 08/14/16   Marylynn Verneita CROME, MD    Family History Family History  Problem Relation Age of Onset   Multiple sclerosis Mother    Hypertension Mother     Coronary artery disease Father    Heart disease Father    Hypertension Father    Heart disease Brother    Cancer Brother    Breast cancer Paternal Aunt  Social History Social History[1]   Allergies   Codeine, Levaquin  [levofloxacin ], Penicillins, Zithromax  [azithromycin ], and Prednisone    Review of Systems Review of Systems  Constitutional:  Negative for fatigue and fever.  HENT:  Positive for ear pain. Negative for congestion, facial swelling, hearing loss, sore throat and trouble swallowing.   Respiratory:  Negative for cough and shortness of breath.   Cardiovascular:  Negative for chest pain.  Musculoskeletal:  Negative for arthralgias and joint swelling.  Skin:  Positive for rash.  Neurological:  Positive for headaches. Negative for dizziness and weakness.     Physical Exam Triage Vital Signs ED Triage Vitals  Encounter Vitals Group     BP      Girls Systolic BP Percentile      Girls Diastolic BP Percentile      Boys Systolic BP Percentile      Boys Diastolic BP Percentile      Pulse      Resp      Temp      Temp src      SpO2      Weight      Height      Head Circumference      Peak Flow      Pain Score      Pain Loc      Pain Education      Exclude from Growth Chart    No data found.  Updated Vital Signs BP 128/73 (BP Location: Left Arm)   Pulse 99   Temp 98.1 F (36.7 C) (Temporal)   Resp 18   SpO2 94%   Physical Exam Vitals and nursing note reviewed.  Constitutional:      General: She is not in acute distress.    Appearance: Normal appearance. She is not ill-appearing or toxic-appearing.  HENT:     Head: Normocephalic and atraumatic.     Right Ear: Tympanic membrane and ear canal normal.     Left Ear: Tympanic membrane, ear canal and external ear normal.     Nose: Nose normal.     Mouth/Throat:     Mouth: Mucous membranes are moist.     Pharynx: Oropharynx is clear.  Eyes:     General: No scleral icterus.       Right eye: No  discharge.        Left eye: No discharge.     Conjunctiva/sclera: Conjunctivae normal.  Cardiovascular:     Rate and Rhythm: Normal rate and regular rhythm.     Heart sounds: Normal heart sounds.  Pulmonary:     Effort: Pulmonary effort is normal. No respiratory distress.     Breath sounds: Normal breath sounds.  Musculoskeletal:     Cervical back: Neck supple.  Skin:    General: Skin is dry.     Findings: Rash present.     Comments: See images included in chart.  Patient has significant swelling and erythema of the right ear but EAC is clear without presence of rash.  Vesicular rash along right jaw and right side of head/scalp.  Additionally has rash affecting upper right chest.  Does not cross midline.  Areas of rash are tender to touch.  Neurological:     General: No focal deficit present.     Mental Status: She is alert. Mental status is at baseline.     Motor: No weakness.     Gait: Gait normal.  Psychiatric:        Mood and Affect:  Mood normal.        Behavior: Behavior normal.             UC Treatments / Results  Labs (all labs ordered are listed, but only abnormal results are displayed) Labs Reviewed - No data to display  106 mL/min Creatinine clearance, original Cockcroft-Gault  EKG   Radiology No results found.  Procedures Procedures (including critical care time)  Medications Ordered in UC Medications - No data to display  Initial Impression / Assessment and Plan / UC Course  I have reviewed the triage vital signs and the nursing notes.  Pertinent labs & imaging results that were available during my care of the patient were reviewed by me and considered in my medical decision making (see chart for details).   68 year old female with multiple comorbidities presents for painful vesicular rash of scalp/head, right jaw, right ear and right upper chest since yesterday.  See images included in chart.  This is consistent with herpes zoster.  Rash is  not affecting inside her ear.  Will treat at this time with Valtrex .  She has normal creatinine clearance.  Send 1000 mg valacyclovir  3 times daily x 1 week.  Offered prescribing something for pain relief but she declines and will take over-the-counter Tylenol .  Reviewed what shingles is, how symptoms present, how it is spread and how long she is contagious.  Also discussed complications.   Final Clinical Impressions(s) / UC Diagnoses   Final diagnoses:  Herpes zoster without complication     Discharge Instructions      What is shingles? Shingles is a painful rash caused by the varicella-zoster virus, the same virus that causes chickenpox.  After you have chickenpox, the virus stays quietly in your nerves. Years later it can wake up as shingles.  What does it look and feel like? Tingling, burning, or pain on one side of the body or face  Then a stripe or patch of blisters appears (usually only on one side)  Blisters break open, ooze, then crust over in 7-10 days  Total illness lasts about 2-4 weeks  How is shingles treated? Shingles is treated with antiviral medication such as:  Valacyclovir  (Valtrex ) Acyclovir Famciclovir  These work best if started within 72 hours of the rash starting.  You may also receive medication for pain or nerve irritation.  How do I take care of the rash? Keep rash clean and dry Use cool compresses for comfort Do not scratch or pick at blisters Wear loose clothing Over-the-counter pain relievers may help  Am I contagious? You cannot give someone shingles, but you can give chickenpox to someone who has never had it or is not vaccinated -- only through direct contact with open blisters.  Avoid close contact with: Pregnant people who never had chickenpox Newborn babies People with weak immune systems Once the blisters are fully crusted over, you are no longer contagious.  Possible complications Post-herpetic neuralgia - nerve pain  that can last months  Eye involvement if rash is on the face ? can threaten vision  Skin infection if blisters are picked  When to seek urgent care Call or be seen immediately if:  Rash is on the face, nose, or near the eye  Severe pain not controlled with medication  Fever, spreading redness, pus, or worsening swelling  You are immunocompromised or pregnant  Can shingles be prevented? Yes -- the Shingrix vaccine:  Recommended for adults 50 years and older  Also for immunocompromised adults >=19  years  Even if youve had shingles before  Bottom line Shingles is painful but treatable. Start antivirals early, keep the rash clean, avoid contact with high-risk people, and call your provider if the rash involves the face or pain is severe.     ED Prescriptions     Medication Sig Dispense Auth. Provider   valACYclovir  (VALTREX ) 1000 MG tablet Take 1 tablet (1,000 mg total) by mouth 3 (three) times daily. 21 tablet Arvis Jolan NOVAK, PA-C      PDMP not reviewed this encounter.     [1]  Social History Tobacco Use   Smoking status: Never   Smokeless tobacco: Never  Vaping Use   Vaping status: Never Used  Substance Use Topics   Alcohol  use: No   Drug use: No     Arvis Jolan NOVAK, PA-C 03/23/24 1221  "

## 2024-03-23 NOTE — ED Triage Notes (Signed)
 Patient presents to UC for shingles on right side of face and neck x yesterday. No OTC meds for discomfort.

## 2024-03-23 NOTE — Discharge Instructions (Addendum)
 What is shingles? Shingles is a painful rash caused by the varicella-zoster virus, the same virus that causes chickenpox.  After you have chickenpox, the virus stays quietly in your nerves. Years later it can wake up as shingles.  What does it look and feel like? Tingling, burning, or pain on one side of the body or face  Then a stripe or patch of blisters appears (usually only on one side)  Blisters break open, ooze, then crust over in 7-10 days  Total illness lasts about 2-4 weeks  How is shingles treated? Shingles is treated with antiviral medication such as:  Valacyclovir  (Valtrex ) Acyclovir Famciclovir  These work best if started within 72 hours of the rash starting.  You may also receive medication for pain or nerve irritation.  How do I take care of the rash? Keep rash clean and dry Use cool compresses for comfort Do not scratch or pick at blisters Wear loose clothing Over-the-counter pain relievers may help  Am I contagious? You cannot give someone shingles, but you can give chickenpox to someone who has never had it or is not vaccinated -- only through direct contact with open blisters.  Avoid close contact with: Pregnant people who never had chickenpox Newborn babies People with weak immune systems Once the blisters are fully crusted over, you are no longer contagious.  Possible complications Post-herpetic neuralgia - nerve pain that can last months  Eye involvement if rash is on the face ? can threaten vision  Skin infection if blisters are picked  When to seek urgent care Call or be seen immediately if:  Rash is on the face, nose, or near the eye  Severe pain not controlled with medication  Fever, spreading redness, pus, or worsening swelling  You are immunocompromised or pregnant  Can shingles be prevented? Yes -- the Shingrix vaccine:  Recommended for adults 50 years and older  Also for immunocompromised adults >=19 years  Even if  youve had shingles before  Bottom line Shingles is painful but treatable. Start antivirals early, keep the rash clean, avoid contact with high-risk people, and call your provider if the rash involves the face or pain is severe.

## 2024-04-15 ENCOUNTER — Other Ambulatory Visit: Payer: Self-pay

## 2024-04-15 DIAGNOSIS — J449 Chronic obstructive pulmonary disease, unspecified: Secondary | ICD-10-CM

## 2024-04-15 MED ORDER — FUROSEMIDE 20 MG PO TABS: ORAL_TABLET | ORAL | 2 refills | Status: AC

## 2024-04-15 MED ORDER — CALCITRIOL 0.25 MCG PO CAPS: 0.2500 ug | ORAL_CAPSULE | Freq: Every day | ORAL | 3 refills | Status: AC

## 2024-04-15 MED ORDER — LEVOTHYROXINE SODIUM 125 MCG PO TABS: 125.0000 ug | ORAL_TABLET | Freq: Every day | ORAL | 3 refills | Status: AC

## 2024-04-15 MED ORDER — LOSARTAN POTASSIUM 50 MG PO TABS: 50.0000 mg | ORAL_TABLET | Freq: Every day | ORAL | 3 refills | Status: AC

## 2024-04-15 NOTE — Addendum Note (Signed)
 Addended by: SEBASTIAN KNEE on: 04/15/2024 09:50 AM   Modules accepted: Orders

## 2024-04-18 ENCOUNTER — Other Ambulatory Visit: Payer: Self-pay | Admitting: Internal Medicine

## 2024-04-18 DIAGNOSIS — J449 Chronic obstructive pulmonary disease, unspecified: Secondary | ICD-10-CM

## 2024-04-19 ENCOUNTER — Encounter: Admitting: Internal Medicine

## 2024-04-29 ENCOUNTER — Ambulatory Visit: Admitting: Pulmonary Disease

## 2024-05-26 ENCOUNTER — Ambulatory Visit: Admitting: Pulmonary Disease

## 2024-06-06 ENCOUNTER — Encounter: Admitting: Internal Medicine

## 2025-03-06 ENCOUNTER — Ambulatory Visit
